# Patient Record
Sex: Male | Born: 1955
Health system: Southern US, Community
[De-identification: ages and names within clinical notes are randomized; demographics above are authoritative.]

## PROBLEM LIST (undated history)

## (undated) ENCOUNTER — Emergency Department (HOSPITAL_COMMUNITY): Admission: EM | Payer: HMO | Source: Home / Self Care

## (undated) DIAGNOSIS — I82409 Acute embolism and thrombosis of unspecified deep veins of unspecified lower extremity: Secondary | ICD-10-CM

## (undated) DIAGNOSIS — M549 Dorsalgia, unspecified: Secondary | ICD-10-CM

## (undated) DIAGNOSIS — F32A Depression, unspecified: Secondary | ICD-10-CM

## (undated) DIAGNOSIS — Z87442 Personal history of urinary calculi: Secondary | ICD-10-CM

## (undated) DIAGNOSIS — F329 Major depressive disorder, single episode, unspecified: Secondary | ICD-10-CM

## (undated) DIAGNOSIS — C801 Malignant (primary) neoplasm, unspecified: Secondary | ICD-10-CM

## (undated) DIAGNOSIS — N289 Disorder of kidney and ureter, unspecified: Secondary | ICD-10-CM

## (undated) DIAGNOSIS — N200 Calculus of kidney: Secondary | ICD-10-CM

## (undated) DIAGNOSIS — K5730 Diverticulosis of large intestine without perforation or abscess without bleeding: Secondary | ICD-10-CM

## (undated) DIAGNOSIS — I10 Essential (primary) hypertension: Secondary | ICD-10-CM

## (undated) DIAGNOSIS — IMO0002 Reserved for concepts with insufficient information to code with codable children: Secondary | ICD-10-CM

## (undated) DIAGNOSIS — E785 Hyperlipidemia, unspecified: Secondary | ICD-10-CM

## (undated) DIAGNOSIS — I2699 Other pulmonary embolism without acute cor pulmonale: Secondary | ICD-10-CM

## (undated) DIAGNOSIS — M1990 Unspecified osteoarthritis, unspecified site: Secondary | ICD-10-CM

## (undated) DIAGNOSIS — G4733 Obstructive sleep apnea (adult) (pediatric): Secondary | ICD-10-CM

## (undated) DIAGNOSIS — E669 Obesity, unspecified: Secondary | ICD-10-CM

## (undated) DIAGNOSIS — I2510 Atherosclerotic heart disease of native coronary artery without angina pectoris: Secondary | ICD-10-CM

## (undated) DIAGNOSIS — I951 Orthostatic hypotension: Secondary | ICD-10-CM

## (undated) DIAGNOSIS — Z7901 Long term (current) use of anticoagulants: Secondary | ICD-10-CM

## (undated) DIAGNOSIS — K219 Gastro-esophageal reflux disease without esophagitis: Secondary | ICD-10-CM

## (undated) DIAGNOSIS — Z86010 Personal history of colonic polyps: Secondary | ICD-10-CM

## (undated) DIAGNOSIS — E291 Testicular hypofunction: Secondary | ICD-10-CM

## (undated) DIAGNOSIS — M1710 Unilateral primary osteoarthritis, unspecified knee: Secondary | ICD-10-CM

## (undated) HISTORY — DX: Unilateral primary osteoarthritis, unspecified knee: M17.10

## (undated) HISTORY — DX: Unspecified osteoarthritis, unspecified site: M19.90

## (undated) HISTORY — DX: Calculus of kidney: N20.0

## (undated) HISTORY — DX: Essential (primary) hypertension: I10

## (undated) HISTORY — PX: GASTRIC BYPASS: SHX52

## (undated) HISTORY — DX: Depression, unspecified: F32.A

## (undated) HISTORY — DX: Other pulmonary embolism without acute cor pulmonale: I26.99

## (undated) HISTORY — DX: Malignant (primary) neoplasm, unspecified: C80.1

## (undated) HISTORY — DX: Obstructive sleep apnea (adult) (pediatric): G47.33

## (undated) HISTORY — DX: Hyperlipidemia, unspecified: E78.5

## (undated) HISTORY — DX: Testicular hypofunction: E29.1

## (undated) HISTORY — PX: TONSILLECTOMY AND ADENOIDECTOMY: SUR1326

## (undated) HISTORY — DX: Dorsalgia, unspecified: M54.9

## (undated) HISTORY — DX: Diverticulosis of large intestine without perforation or abscess without bleeding: K57.30

## (undated) HISTORY — DX: Gastro-esophageal reflux disease without esophagitis: K21.9

## (undated) HISTORY — DX: Atherosclerotic heart disease of native coronary artery without angina pectoris: I25.10

## (undated) HISTORY — DX: Major depressive disorder, single episode, unspecified: F32.9

## (undated) HISTORY — DX: Acute embolism and thrombosis of unspecified deep veins of unspecified lower extremity: I82.409

## (undated) HISTORY — DX: Disorder of kidney and ureter, unspecified: N28.9

## (undated) HISTORY — DX: Reserved for concepts with insufficient information to code with codable children: IMO0002

## (undated) HISTORY — DX: Personal history of colonic polyps: Z86.010

## (undated) HISTORY — DX: Orthostatic hypotension: I95.1

## (undated) HISTORY — DX: Obesity, unspecified: E66.9

## (undated) HISTORY — DX: Long term (current) use of anticoagulants: Z79.01

## (undated) HISTORY — PX: COLONOSCOPY: SHX174

## (undated) HISTORY — PX: OTHER SURGICAL HISTORY: SHX169

---
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
## 2000-01-25 ENCOUNTER — Encounter: Payer: Self-pay | Admitting: Orthopedic Surgery

## 2000-01-29 ENCOUNTER — Ambulatory Visit (HOSPITAL_COMMUNITY): Admission: RE | Admit: 2000-01-29 | Discharge: 2000-01-29 | Payer: Self-pay | Admitting: Orthopedic Surgery

## 2000-01-29 ENCOUNTER — Encounter (INDEPENDENT_AMBULATORY_CARE_PROVIDER_SITE_OTHER): Payer: Self-pay | Admitting: Specialist

## 2002-11-19 ENCOUNTER — Encounter: Admission: RE | Admit: 2002-11-19 | Discharge: 2002-11-19 | Payer: Self-pay | Admitting: Orthopaedic Surgery

## 2002-11-19 ENCOUNTER — Encounter: Payer: Self-pay | Admitting: Orthopaedic Surgery

## 2003-01-16 ENCOUNTER — Ambulatory Visit (HOSPITAL_COMMUNITY): Admission: RE | Admit: 2003-01-16 | Discharge: 2003-01-16 | Payer: Self-pay | Admitting: Orthopedic Surgery

## 2003-12-07 HISTORY — PX: HEEL SPUR SURGERY: SHX665

## 2004-07-10 ENCOUNTER — Ambulatory Visit (HOSPITAL_BASED_OUTPATIENT_CLINIC_OR_DEPARTMENT_OTHER): Admission: RE | Admit: 2004-07-10 | Discharge: 2004-07-10 | Payer: Self-pay | Admitting: Internal Medicine

## 2004-08-02 ENCOUNTER — Encounter: Payer: Self-pay | Admitting: Internal Medicine

## 2004-08-20 ENCOUNTER — Encounter: Payer: Self-pay | Admitting: Internal Medicine

## 2004-10-03 ENCOUNTER — Ambulatory Visit (HOSPITAL_COMMUNITY): Admission: RE | Admit: 2004-10-03 | Discharge: 2004-10-03 | Payer: Self-pay | Admitting: Orthopaedic Surgery

## 2004-10-04 ENCOUNTER — Encounter: Admission: RE | Admit: 2004-10-04 | Discharge: 2004-10-04 | Payer: Self-pay | Admitting: Orthopaedic Surgery

## 2004-10-08 ENCOUNTER — Ambulatory Visit: Payer: Self-pay | Admitting: Pulmonary Disease

## 2004-10-19 ENCOUNTER — Emergency Department (HOSPITAL_COMMUNITY): Admission: EM | Admit: 2004-10-19 | Discharge: 2004-10-19 | Payer: Self-pay | Admitting: Emergency Medicine

## 2004-11-06 ENCOUNTER — Ambulatory Visit: Payer: Self-pay | Admitting: Pulmonary Disease

## 2004-11-06 ENCOUNTER — Ambulatory Visit (HOSPITAL_BASED_OUTPATIENT_CLINIC_OR_DEPARTMENT_OTHER): Admission: RE | Admit: 2004-11-06 | Discharge: 2004-11-06 | Payer: Self-pay | Admitting: Pulmonary Disease

## 2004-11-24 ENCOUNTER — Ambulatory Visit: Admission: RE | Admit: 2004-11-24 | Discharge: 2004-12-03 | Payer: Self-pay | Admitting: Radiation Oncology

## 2004-11-27 ENCOUNTER — Observation Stay (HOSPITAL_COMMUNITY): Admission: RE | Admit: 2004-11-27 | Discharge: 2004-11-28 | Payer: Self-pay | Admitting: Orthopedic Surgery

## 2005-04-08 ENCOUNTER — Ambulatory Visit: Payer: Self-pay | Admitting: Internal Medicine

## 2005-04-16 ENCOUNTER — Ambulatory Visit: Payer: Self-pay

## 2005-07-15 ENCOUNTER — Ambulatory Visit: Payer: Self-pay | Admitting: Internal Medicine

## 2005-07-26 ENCOUNTER — Ambulatory Visit: Payer: Self-pay | Admitting: Endocrinology

## 2005-07-26 ENCOUNTER — Inpatient Hospital Stay (HOSPITAL_COMMUNITY): Admission: EM | Admit: 2005-07-26 | Discharge: 2005-08-05 | Payer: Self-pay

## 2005-07-27 ENCOUNTER — Ambulatory Visit: Payer: Self-pay | Admitting: *Deleted

## 2005-07-27 ENCOUNTER — Encounter: Payer: Self-pay | Admitting: Cardiology

## 2005-08-10 ENCOUNTER — Ambulatory Visit: Payer: Self-pay | Admitting: Internal Medicine

## 2005-08-13 ENCOUNTER — Emergency Department (HOSPITAL_COMMUNITY): Admission: EM | Admit: 2005-08-13 | Discharge: 2005-08-13 | Payer: Self-pay | Admitting: Emergency Medicine

## 2005-08-13 ENCOUNTER — Ambulatory Visit: Payer: Self-pay | Admitting: Internal Medicine

## 2005-08-20 ENCOUNTER — Ambulatory Visit: Payer: Self-pay | Admitting: Cardiology

## 2005-08-20 ENCOUNTER — Ambulatory Visit: Payer: Self-pay | Admitting: Internal Medicine

## 2005-08-26 ENCOUNTER — Ambulatory Visit: Payer: Self-pay | Admitting: Cardiology

## 2005-09-09 ENCOUNTER — Ambulatory Visit: Payer: Self-pay | Admitting: Internal Medicine

## 2005-09-10 ENCOUNTER — Ambulatory Visit: Payer: Self-pay | Admitting: Internal Medicine

## 2005-09-17 ENCOUNTER — Ambulatory Visit: Payer: Self-pay | Admitting: Cardiology

## 2005-09-21 ENCOUNTER — Ambulatory Visit: Payer: Self-pay | Admitting: Internal Medicine

## 2005-09-29 ENCOUNTER — Ambulatory Visit: Payer: Self-pay | Admitting: Cardiology

## 2005-09-29 ENCOUNTER — Ambulatory Visit: Payer: Self-pay

## 2005-10-12 ENCOUNTER — Ambulatory Visit: Payer: Self-pay

## 2005-10-13 ENCOUNTER — Ambulatory Visit: Payer: Self-pay

## 2005-10-21 ENCOUNTER — Ambulatory Visit: Payer: Self-pay | Admitting: Cardiology

## 2005-10-21 ENCOUNTER — Ambulatory Visit: Payer: Self-pay | Admitting: Internal Medicine

## 2005-11-08 ENCOUNTER — Ambulatory Visit: Payer: Self-pay | Admitting: Cardiology

## 2005-12-03 ENCOUNTER — Ambulatory Visit: Payer: Self-pay | Admitting: Cardiology

## 2005-12-13 ENCOUNTER — Ambulatory Visit: Payer: Self-pay | Admitting: Cardiology

## 2006-01-03 ENCOUNTER — Ambulatory Visit: Payer: Self-pay | Admitting: Cardiology

## 2006-01-10 ENCOUNTER — Ambulatory Visit: Payer: Self-pay | Admitting: Internal Medicine

## 2006-01-31 ENCOUNTER — Ambulatory Visit: Payer: Self-pay | Admitting: Internal Medicine

## 2006-02-14 ENCOUNTER — Ambulatory Visit: Payer: Self-pay | Admitting: Cardiology

## 2006-03-14 ENCOUNTER — Ambulatory Visit: Payer: Self-pay | Admitting: Cardiology

## 2006-04-06 ENCOUNTER — Ambulatory Visit: Payer: Self-pay | Admitting: Internal Medicine

## 2006-04-11 ENCOUNTER — Ambulatory Visit: Payer: Self-pay | Admitting: Cardiology

## 2006-04-25 ENCOUNTER — Ambulatory Visit: Payer: Self-pay | Admitting: Internal Medicine

## 2006-05-23 ENCOUNTER — Ambulatory Visit: Payer: Self-pay | Admitting: Cardiology

## 2006-06-06 ENCOUNTER — Ambulatory Visit: Payer: Self-pay | Admitting: Cardiovascular Disease

## 2006-06-13 DIAGNOSIS — Z86711 Personal history of pulmonary embolism: Secondary | ICD-10-CM | POA: Insufficient documentation

## 2006-07-04 ENCOUNTER — Ambulatory Visit: Payer: Self-pay | Admitting: Cardiovascular Disease

## 2006-07-18 ENCOUNTER — Ambulatory Visit: Payer: Self-pay | Admitting: Cardiology

## 2006-07-18 ENCOUNTER — Ambulatory Visit: Payer: Self-pay | Admitting: Cardiovascular Disease

## 2006-08-01 ENCOUNTER — Ambulatory Visit: Payer: Self-pay | Admitting: Cardiology

## 2006-08-25 ENCOUNTER — Ambulatory Visit: Payer: Self-pay | Admitting: Cardiology

## 2006-09-08 ENCOUNTER — Ambulatory Visit: Payer: Self-pay | Admitting: Cardiology

## 2006-09-29 ENCOUNTER — Ambulatory Visit: Payer: Self-pay | Admitting: Cardiology

## 2006-11-07 ENCOUNTER — Ambulatory Visit: Payer: Self-pay | Admitting: Internal Medicine

## 2006-12-06 HISTORY — PX: OTHER SURGICAL HISTORY: SHX169

## 2007-01-04 ENCOUNTER — Ambulatory Visit: Payer: Self-pay | Admitting: Internal Medicine

## 2007-01-04 ENCOUNTER — Ambulatory Visit: Payer: Self-pay | Admitting: Cardiology

## 2007-01-09 ENCOUNTER — Ambulatory Visit: Payer: Self-pay | Admitting: Internal Medicine

## 2007-01-09 LAB — CONVERTED CEMR LAB
AST: 25 units/L (ref 0–37)
Alkaline Phosphatase: 71 units/L (ref 39–117)
Basophils Absolute: 0 10*3/uL (ref 0.0–0.1)
Cholesterol: 173 mg/dL (ref 0–200)
Eosinophils Absolute: 0.1 10*3/uL (ref 0.0–0.6)
Eosinophils Relative: 1.7 % (ref 0.0–5.0)
GFR calc non Af Amer: 45 mL/min
HCT: 44.6 % (ref 39.0–52.0)
HDL: 37.6 mg/dL — ABNORMAL LOW (ref >39.0)
MCV: 71.1 fL — ABNORMAL LOW (ref 78.0–100.0)
Monocytes Absolute: 0.8 10*3/uL — ABNORMAL HIGH (ref 0.2–0.7)
Monocytes Relative: 8.9 % (ref 3.0–11.0)
Neutro Abs: 4.5 10*3/uL (ref 1.4–7.7)
Neutrophils Relative %: 50.5 % (ref 43.0–77.0)
Potassium: 4.8 meq/L (ref 3.5–5.1)
RBC: 6.26 M/uL — ABNORMAL HIGH (ref 4.22–5.81)
RDW: 13.2 % (ref 11.5–14.6)
Total Bilirubin: 0.7 mg/dL (ref 0.3–1.2)
Total CHOL/HDL Ratio: 4.6
Total Protein: 7.5 g/dL (ref 6.0–8.3)
Triglycerides: 136 mg/dL (ref 0–149)
VLDL: 27 mg/dL (ref 0–40)
WBC: 8.8 10*3/uL (ref 4.5–10.5)

## 2007-02-01 ENCOUNTER — Ambulatory Visit: Payer: Self-pay | Admitting: Internal Medicine

## 2007-02-06 ENCOUNTER — Ambulatory Visit: Payer: Self-pay | Admitting: Internal Medicine

## 2007-02-13 ENCOUNTER — Encounter (INDEPENDENT_AMBULATORY_CARE_PROVIDER_SITE_OTHER): Payer: Self-pay | Admitting: Specialist

## 2007-02-13 ENCOUNTER — Ambulatory Visit: Payer: Self-pay | Admitting: Internal Medicine

## 2007-02-15 ENCOUNTER — Ambulatory Visit: Payer: Self-pay | Admitting: Cardiology

## 2007-02-20 ENCOUNTER — Ambulatory Visit: Payer: Self-pay | Admitting: Internal Medicine

## 2007-02-20 LAB — CONVERTED CEMR LAB
ALT: 23 units/L (ref 0–40)
Albumin: 4 g/dL (ref 3.5–5.2)
Alkaline Phosphatase: 54 units/L (ref 39–117)
Bilirubin Urine: NEGATIVE
Bilirubin, Direct: 0.1 mg/dL (ref 0.0–0.3)
Calcium: 9.3 mg/dL (ref 8.4–10.5)
Chloride: 102 meq/L (ref 96–112)
Creatinine, Ser: 2.3 mg/dL — ABNORMAL HIGH (ref 0.4–1.5)
Creatinine,U: 364.5 mg/dL
GFR calc non Af Amer: 32 mL/min
Glucose, Bld: 148 mg/dL — ABNORMAL HIGH (ref 70–99)
HCT: 35.4 % — ABNORMAL LOW (ref 39.0–52.0)
LDL Cholesterol: 71 mg/dL (ref 0–99)
Leukocytes, UA: NEGATIVE
Lymphocytes Relative: 23.1 % (ref 12.0–46.0)
MCHC: 33.2 g/dL (ref 30.0–36.0)
MCV: 69.1 fL — ABNORMAL LOW (ref 78.0–100.0)
Monocytes Relative: 6.1 % (ref 3.0–11.0)
Neutro Abs: 7.5 10*3/uL (ref 1.4–7.7)
Neutrophils Relative %: 68.6 % (ref 43.0–77.0)
PSA: 0.3 ng/mL
Sodium: 140 meq/L (ref 135–145)
Specific Gravity, Urine: >=1.03 (ref 1.000–1.03)
Total Bilirubin: 0.5 mg/dL (ref 0.3–1.2)
Total Protein, Urine: NEGATIVE mg/dL
Total Protein: 7.1 g/dL (ref 6.0–8.3)
Triglycerides: 175 mg/dL — ABNORMAL HIGH (ref 0–149)
Urobilinogen, UA: 0.2 (ref 0.0–1.0)
VLDL: 35 mg/dL (ref 0–40)
WBC: 11.1 10*3/uL — ABNORMAL HIGH (ref 4.5–10.5)
pH: 5 (ref 5.0–8.0)

## 2007-02-21 ENCOUNTER — Ambulatory Visit: Payer: Self-pay | Admitting: Internal Medicine

## 2007-02-27 ENCOUNTER — Ambulatory Visit (HOSPITAL_COMMUNITY): Admission: RE | Admit: 2007-02-27 | Discharge: 2007-02-27 | Payer: Self-pay | Admitting: Internal Medicine

## 2007-03-01 ENCOUNTER — Ambulatory Visit: Payer: Self-pay | Admitting: Cardiovascular Disease

## 2007-03-20 ENCOUNTER — Ambulatory Visit: Payer: Self-pay | Admitting: Internal Medicine

## 2007-03-20 LAB — CONVERTED CEMR LAB
Albumin: 3.6 g/dL (ref 3.5–5.2)
BUN: 17 mg/dL (ref 6–23)
Calcium: 9.3 mg/dL (ref 8.4–10.5)
Chloride: 106 meq/L (ref 96–112)
Creatinine, Ser: 1.2 mg/dL (ref 0.4–1.5)
Phosphorus: 3.2 mg/dL (ref 2.3–4.6)
Potassium: 4.4 meq/L (ref 3.5–5.1)
Sodium: 144 meq/L (ref 135–145)

## 2007-03-22 ENCOUNTER — Ambulatory Visit (HOSPITAL_COMMUNITY): Admission: RE | Admit: 2007-03-22 | Discharge: 2007-03-22 | Payer: Self-pay | Admitting: Internal Medicine

## 2007-03-22 ENCOUNTER — Ambulatory Visit: Payer: Self-pay | Admitting: Cardiology

## 2007-04-07 ENCOUNTER — Ambulatory Visit: Payer: Self-pay | Admitting: Cardiology

## 2007-04-12 ENCOUNTER — Ambulatory Visit: Payer: Self-pay | Admitting: Pulmonary Disease

## 2007-06-30 ENCOUNTER — Ambulatory Visit: Payer: Self-pay | Admitting: Cardiology

## 2007-06-30 ENCOUNTER — Encounter: Payer: Self-pay | Admitting: Internal Medicine

## 2007-06-30 DIAGNOSIS — E785 Hyperlipidemia, unspecified: Secondary | ICD-10-CM | POA: Insufficient documentation

## 2007-06-30 DIAGNOSIS — I10 Essential (primary) hypertension: Secondary | ICD-10-CM | POA: Insufficient documentation

## 2007-06-30 DIAGNOSIS — E119 Type 2 diabetes mellitus without complications: Secondary | ICD-10-CM | POA: Insufficient documentation

## 2007-07-21 ENCOUNTER — Ambulatory Visit: Payer: Self-pay | Admitting: Cardiology

## 2007-08-29 ENCOUNTER — Ambulatory Visit: Payer: Self-pay | Admitting: Cardiology

## 2007-09-19 ENCOUNTER — Ambulatory Visit: Payer: Self-pay | Admitting: Pulmonary Disease

## 2007-09-19 LAB — CONVERTED CEMR LAB
Bilirubin Urine: NEGATIVE
Crystals: NEGATIVE
Ketones, ur: NEGATIVE mg/dL
Nitrite: NEGATIVE
Specific Gravity, Urine: 1.025 (ref 1.000–1.03)
Total Protein, Urine: 30 mg/dL — AB
Urobilinogen, UA: 0.2 (ref 0.0–1.0)
pH: 6 (ref 5.0–8.0)

## 2007-10-05 ENCOUNTER — Encounter: Payer: Self-pay | Admitting: Internal Medicine

## 2007-10-05 ENCOUNTER — Ambulatory Visit: Payer: Self-pay | Admitting: Internal Medicine

## 2007-10-05 DIAGNOSIS — F329 Major depressive disorder, single episode, unspecified: Secondary | ICD-10-CM

## 2007-10-05 DIAGNOSIS — F32A Depression, unspecified: Secondary | ICD-10-CM | POA: Insufficient documentation

## 2007-10-05 DIAGNOSIS — K5730 Diverticulosis of large intestine without perforation or abscess without bleeding: Secondary | ICD-10-CM | POA: Insufficient documentation

## 2007-10-05 DIAGNOSIS — N259 Disorder resulting from impaired renal tubular function, unspecified: Secondary | ICD-10-CM | POA: Insufficient documentation

## 2007-10-05 DIAGNOSIS — N390 Urinary tract infection, site not specified: Secondary | ICD-10-CM | POA: Insufficient documentation

## 2007-10-05 DIAGNOSIS — Z860100 Personal history of colon polyps, unspecified: Secondary | ICD-10-CM | POA: Insufficient documentation

## 2007-10-10 LAB — CONVERTED CEMR LAB
Cholesterol: 255 mg/dL (ref 0–200)
Creatinine, Ser: 1.6 mg/dL — ABNORMAL HIGH (ref 0.4–1.5)
GFR calc Af Amer: 58 mL/min
GFR calc non Af Amer: 48 mL/min
Hgb A1c MFr Bld: 7.3 % — ABNORMAL HIGH (ref 4.6–6.0)
Sodium: 141 meq/L (ref 135–145)
Triglycerides: 168 mg/dL — ABNORMAL HIGH (ref 0–149)
VLDL: 34 mg/dL (ref 0–40)

## 2007-12-21 ENCOUNTER — Ambulatory Visit: Payer: Self-pay | Admitting: Internal Medicine

## 2008-01-18 ENCOUNTER — Ambulatory Visit: Payer: Self-pay | Admitting: Cardiology

## 2008-01-29 ENCOUNTER — Ambulatory Visit: Payer: Self-pay | Admitting: Cardiology

## 2008-02-12 ENCOUNTER — Ambulatory Visit: Payer: Self-pay | Admitting: Cardiology

## 2008-03-04 ENCOUNTER — Ambulatory Visit: Payer: Self-pay | Admitting: Internal Medicine

## 2008-03-04 DIAGNOSIS — E291 Testicular hypofunction: Secondary | ICD-10-CM | POA: Insufficient documentation

## 2008-03-04 HISTORY — DX: Morbid (severe) obesity due to excess calories: E66.01

## 2008-03-06 LAB — CONVERTED CEMR LAB
AST: 58 units/L — ABNORMAL HIGH (ref 0–37)
Alkaline Phosphatase: 38 units/L — ABNORMAL LOW (ref 39–117)
Basophils Absolute: 0 10*3/uL (ref 0.0–0.1)
Bilirubin, Direct: 0.1 mg/dL (ref 0.0–0.3)
Eosinophils Absolute: 0.1 10*3/uL (ref 0.0–0.7)
GFR calc Af Amer: 74 mL/min
GFR calc non Af Amer: 61 mL/min
Glucose, Bld: 61 mg/dL — ABNORMAL LOW (ref 70–99)
HDL: 50.2 mg/dL (ref >39.0)
Hemoglobin: 12 g/dL — ABNORMAL LOW (ref 13.0–17.0)
Hgb A1c MFr Bld: 6.9 % — ABNORMAL HIGH (ref 4.6–6.0)
Lymphocytes Relative: 34.7 % (ref 12.0–46.0)
Monocytes Absolute: 0.6 10*3/uL (ref 0.1–1.0)
Monocytes Relative: 7.2 % (ref 3.0–12.0)
Neutro Abs: 4.4 10*3/uL (ref 1.4–7.7)
RDW: 14.6 % (ref 11.5–14.6)
Specific Gravity, Urine: >=1.03 (ref 1.000–1.03)
Total CHOL/HDL Ratio: 3.1
VLDL: 18 mg/dL (ref 0–40)
WBC: 7.8 10*3/uL (ref 4.5–10.5)
pH: 5 (ref 5.0–8.0)

## 2008-03-11 ENCOUNTER — Ambulatory Visit: Payer: Self-pay | Admitting: Cardiovascular Disease

## 2008-03-25 ENCOUNTER — Ambulatory Visit: Payer: Self-pay | Admitting: Cardiology

## 2008-04-22 ENCOUNTER — Ambulatory Visit: Payer: Self-pay | Admitting: Cardiology

## 2008-05-20 ENCOUNTER — Ambulatory Visit: Payer: Self-pay | Admitting: Internal Medicine

## 2008-05-22 ENCOUNTER — Ambulatory Visit: Payer: Self-pay | Admitting: Internal Medicine

## 2008-05-22 DIAGNOSIS — M79609 Pain in unspecified limb: Secondary | ICD-10-CM | POA: Insufficient documentation

## 2008-07-17 ENCOUNTER — Ambulatory Visit: Payer: Self-pay | Admitting: Cardiology

## 2008-07-18 ENCOUNTER — Ambulatory Visit: Payer: Self-pay | Admitting: Internal Medicine

## 2008-07-24 ENCOUNTER — Telehealth: Payer: Self-pay | Admitting: Internal Medicine

## 2008-07-25 ENCOUNTER — Inpatient Hospital Stay (HOSPITAL_COMMUNITY): Admission: RE | Admit: 2008-07-25 | Discharge: 2008-07-26 | Payer: Self-pay | Admitting: Orthopedic Surgery

## 2008-08-05 ENCOUNTER — Ambulatory Visit: Payer: Self-pay | Admitting: Cardiology

## 2008-08-30 ENCOUNTER — Ambulatory Visit: Payer: Self-pay | Admitting: Internal Medicine

## 2008-08-31 LAB — CONVERTED CEMR LAB
AST: 29 units/L (ref 0–37)
Alkaline Phosphatase: 57 units/L (ref 39–117)
Calcium: 9.4 mg/dL (ref 8.4–10.5)
Chloride: 104 meq/L (ref 96–112)
GFR calc Af Amer: 81 mL/min
HDL: 38.4 mg/dL — ABNORMAL LOW (ref >39.0)
LDL Cholesterol: 58 mg/dL (ref 0–99)
Total Bilirubin: 0.8 mg/dL (ref 0.3–1.2)
Total CHOL/HDL Ratio: 2.9
Total Protein: 7.2 g/dL (ref 6.0–8.3)
Triglycerides: 83 mg/dL (ref 0–149)

## 2008-09-02 ENCOUNTER — Ambulatory Visit: Payer: Self-pay | Admitting: Internal Medicine

## 2008-09-23 ENCOUNTER — Ambulatory Visit: Payer: Self-pay | Admitting: Internal Medicine

## 2008-10-09 ENCOUNTER — Telehealth: Payer: Self-pay | Admitting: Internal Medicine

## 2008-10-21 ENCOUNTER — Ambulatory Visit: Payer: Self-pay | Admitting: Cardiology

## 2008-11-18 ENCOUNTER — Ambulatory Visit: Payer: Self-pay | Admitting: Cardiology

## 2008-12-02 ENCOUNTER — Ambulatory Visit: Payer: Self-pay | Admitting: Internal Medicine

## 2008-12-09 ENCOUNTER — Ambulatory Visit: Payer: Self-pay | Admitting: Internal Medicine

## 2008-12-17 ENCOUNTER — Ambulatory Visit: Payer: Self-pay | Admitting: Cardiovascular Disease

## 2008-12-27 ENCOUNTER — Ambulatory Visit: Payer: Self-pay | Admitting: Internal Medicine

## 2008-12-27 LAB — CONVERTED CEMR LAB
Basophils Absolute: 0.1 10*3/uL (ref 0.0–0.1)
Basophils Relative: 1.8 % (ref 0.0–3.0)
Eosinophils Relative: 0.9 % (ref 0.0–5.0)
GFR calc Af Amer: 58 mL/min
HCT: 39.2 % (ref 39.0–52.0)
Hemoglobin, Urine: NEGATIVE
Hemoglobin: 13 g/dL (ref 13.0–17.0)
Leukocytes, UA: NEGATIVE
Lipase: 26 units/L (ref 11.0–59.0)
MCV: 71.2 fL — ABNORMAL LOW (ref 78.0–100.0)
Neutro Abs: 5.4 10*3/uL (ref 1.4–7.7)
Neutrophils Relative %: 66.1 % (ref 43.0–77.0)
Nitrite: NEGATIVE
RBC: 5.51 M/uL (ref 4.22–5.81)
Sodium: 131 meq/L — ABNORMAL LOW (ref 135–145)
Specific Gravity, Urine: >=1.03 (ref 1.000–1.03)
pH: 5 (ref 5.0–8.0)

## 2008-12-31 ENCOUNTER — Encounter (INDEPENDENT_AMBULATORY_CARE_PROVIDER_SITE_OTHER): Payer: Self-pay | Admitting: *Deleted

## 2009-01-07 ENCOUNTER — Ambulatory Visit: Payer: Self-pay | Admitting: Cardiology

## 2009-01-13 ENCOUNTER — Telehealth: Payer: Self-pay | Admitting: Internal Medicine

## 2009-01-21 ENCOUNTER — Ambulatory Visit: Payer: Self-pay | Admitting: Cardiology

## 2009-02-11 ENCOUNTER — Ambulatory Visit: Payer: Self-pay | Admitting: Cardiology

## 2009-02-13 ENCOUNTER — Telehealth: Payer: Self-pay | Admitting: Internal Medicine

## 2009-03-03 ENCOUNTER — Ambulatory Visit: Payer: Self-pay | Admitting: Cardiology

## 2009-03-11 ENCOUNTER — Ambulatory Visit: Payer: Self-pay | Admitting: Internal Medicine

## 2009-03-11 LAB — CONVERTED CEMR LAB
ALT: 24 units/L (ref 0–53)
AST: 28 units/L (ref 0–37)
Albumin: 3.9 g/dL (ref 3.5–5.2)
BUN: 27 mg/dL — ABNORMAL HIGH (ref 6–23)
Basophils Absolute: 0 10*3/uL (ref 0.0–0.1)
Bilirubin Urine: NEGATIVE
Bilirubin, Direct: 0.1 mg/dL (ref 0.0–0.3)
Creatinine, Ser: 1.2 mg/dL (ref 0.4–1.5)
Eosinophils Absolute: 0.2 10*3/uL (ref 0.0–0.7)
Glucose, Bld: 100 mg/dL — ABNORMAL HIGH (ref 70–99)
HDL: 36.1 mg/dL — ABNORMAL LOW (ref >39.00)
Hemoglobin, Urine: NEGATIVE
Hemoglobin: 12.1 g/dL — ABNORMAL LOW (ref 13.0–17.0)
LDL Cholesterol: 76 mg/dL (ref 0–99)
Leukocytes, UA: NEGATIVE
Monocytes Absolute: 0.7 10*3/uL (ref 0.1–1.0)
Neutrophils Relative %: 61 % (ref 43.0–77.0)
Nitrite: NEGATIVE
RBC: 5.23 M/uL (ref 4.22–5.81)
Sodium: 138 meq/L (ref 135–145)
Total Bilirubin: 0.6 mg/dL (ref 0.3–1.2)
Total CHOL/HDL Ratio: 4
Triglycerides: 88 mg/dL (ref 0.0–149.0)
Urobilinogen, UA: 0.2 (ref 0.0–1.0)
WBC: 8.9 10*3/uL (ref 4.5–10.5)

## 2009-03-31 ENCOUNTER — Ambulatory Visit: Payer: Self-pay | Admitting: Cardiology

## 2009-04-16 ENCOUNTER — Ambulatory Visit: Payer: Self-pay | Admitting: Cardiology

## 2009-04-23 ENCOUNTER — Ambulatory Visit: Payer: Self-pay | Admitting: Cardiology

## 2009-05-06 ENCOUNTER — Encounter: Payer: Self-pay | Admitting: *Deleted

## 2009-05-07 ENCOUNTER — Ambulatory Visit: Payer: Self-pay | Admitting: Cardiology

## 2009-05-16 ENCOUNTER — Ambulatory Visit: Payer: Self-pay | Admitting: Cardiovascular Disease

## 2009-05-16 LAB — CONVERTED CEMR LAB
POC INR: 2.2
Protime: 18.2

## 2009-06-07 ENCOUNTER — Ambulatory Visit: Payer: Self-pay | Admitting: Family Medicine

## 2009-06-11 ENCOUNTER — Encounter: Payer: Self-pay | Admitting: *Deleted

## 2009-06-12 ENCOUNTER — Ambulatory Visit: Payer: Self-pay | Admitting: Internal Medicine

## 2009-06-12 LAB — CONVERTED CEMR LAB
POC INR: 1.6
Prothrombin Time: 15.6 s

## 2009-06-27 ENCOUNTER — Ambulatory Visit: Payer: Self-pay | Admitting: Internal Medicine

## 2009-06-27 LAB — CONVERTED CEMR LAB: POC INR: 1.8

## 2009-07-01 ENCOUNTER — Ambulatory Visit: Payer: Self-pay | Admitting: Internal Medicine

## 2009-07-01 ENCOUNTER — Encounter: Payer: Self-pay | Admitting: Internal Medicine

## 2009-07-01 DIAGNOSIS — R062 Wheezing: Secondary | ICD-10-CM | POA: Insufficient documentation

## 2009-07-11 ENCOUNTER — Ambulatory Visit: Payer: Self-pay | Admitting: Cardiology

## 2009-07-11 LAB — CONVERTED CEMR LAB: POC INR: 1.8

## 2009-07-24 ENCOUNTER — Telehealth (INDEPENDENT_AMBULATORY_CARE_PROVIDER_SITE_OTHER): Payer: Self-pay | Admitting: Cardiology

## 2009-07-25 ENCOUNTER — Ambulatory Visit: Payer: Self-pay | Admitting: Cardiovascular Disease

## 2009-08-08 ENCOUNTER — Ambulatory Visit: Payer: Self-pay | Admitting: Internal Medicine

## 2009-09-08 ENCOUNTER — Ambulatory Visit: Payer: Self-pay | Admitting: Internal Medicine

## 2009-09-11 ENCOUNTER — Ambulatory Visit: Payer: Self-pay | Admitting: Internal Medicine

## 2009-09-11 LAB — CONVERTED CEMR LAB
CO2: 31 meq/L (ref 19–32)
Calcium: 9.2 mg/dL (ref 8.4–10.5)
Chloride: 103 meq/L (ref 96–112)
Creatinine, Ser: 1 mg/dL (ref 0.4–1.5)
Glucose, Bld: 69 mg/dL — ABNORMAL LOW (ref 70–99)
Hgb A1c MFr Bld: 6.7 % — ABNORMAL HIGH (ref 4.6–6.5)
LDL Cholesterol: 79 mg/dL (ref 0–99)
Sodium: 135 meq/L (ref 135–145)
Triglycerides: 66 mg/dL (ref 0.0–149.0)
VLDL: 13.2 mg/dL (ref 0.0–40.0)

## 2009-09-12 ENCOUNTER — Ambulatory Visit: Payer: Self-pay | Admitting: Internal Medicine

## 2009-09-12 DIAGNOSIS — F17200 Nicotine dependence, unspecified, uncomplicated: Secondary | ICD-10-CM | POA: Insufficient documentation

## 2009-09-24 ENCOUNTER — Encounter: Payer: Self-pay | Admitting: Internal Medicine

## 2009-10-06 ENCOUNTER — Ambulatory Visit: Payer: Self-pay | Admitting: Cardiology

## 2009-10-06 LAB — CONVERTED CEMR LAB: POC INR: 2.1

## 2009-11-03 ENCOUNTER — Ambulatory Visit: Payer: Self-pay | Admitting: Cardiology

## 2009-11-17 ENCOUNTER — Ambulatory Visit: Payer: Self-pay | Admitting: Internal Medicine

## 2009-11-17 DIAGNOSIS — IMO0002 Reserved for concepts with insufficient information to code with codable children: Secondary | ICD-10-CM | POA: Insufficient documentation

## 2009-11-17 DIAGNOSIS — M1710 Unilateral primary osteoarthritis, unspecified knee: Secondary | ICD-10-CM

## 2009-12-08 ENCOUNTER — Ambulatory Visit: Payer: Self-pay | Admitting: Cardiology

## 2009-12-08 LAB — CONVERTED CEMR LAB: POC INR: 3.1

## 2009-12-12 ENCOUNTER — Encounter: Payer: Self-pay | Admitting: Internal Medicine

## 2009-12-24 ENCOUNTER — Ambulatory Visit: Payer: Self-pay | Admitting: Internal Medicine

## 2009-12-24 DIAGNOSIS — M549 Dorsalgia, unspecified: Secondary | ICD-10-CM | POA: Insufficient documentation

## 2010-01-05 ENCOUNTER — Ambulatory Visit: Payer: Self-pay | Admitting: Cardiology

## 2010-01-05 LAB — CONVERTED CEMR LAB: POC INR: 3

## 2010-01-12 ENCOUNTER — Ambulatory Visit (HOSPITAL_COMMUNITY): Admission: RE | Admit: 2010-01-12 | Discharge: 2010-01-12 | Payer: Self-pay | Admitting: Internal Medicine

## 2010-01-12 ENCOUNTER — Telehealth: Payer: Self-pay | Admitting: Internal Medicine

## 2010-01-12 ENCOUNTER — Ambulatory Visit: Payer: Self-pay | Admitting: Internal Medicine

## 2010-01-13 LAB — CONVERTED CEMR LAB
ALT: 36 units/L (ref 0–53)
Albumin: 4.1 g/dL (ref 3.5–5.2)
Basophils Relative: 0.1 % (ref 0.0–3.0)
Bilirubin Urine: NEGATIVE
Bilirubin, Direct: 0.2 mg/dL (ref 0.0–0.3)
CO2: 30 meq/L (ref 19–32)
Calcium: 9.8 mg/dL (ref 8.4–10.5)
Chloride: 97 meq/L (ref 96–112)
Creatinine, Ser: 1.5 mg/dL (ref 0.4–1.5)
Eosinophils Absolute: 0.1 10*3/uL (ref 0.0–0.7)
H Pylori IgG: NEGATIVE
Hemoglobin, Urine: NEGATIVE
Hemoglobin: 13.7 g/dL (ref 13.0–17.0)
Hgb A1c MFr Bld: 9.8 % — ABNORMAL HIGH (ref 4.6–6.5)
Lipase: 52 units/L (ref 11.0–59.0)
Lymphs Abs: 2.9 10*3/uL (ref 0.7–4.0)
MCHC: 31.3 g/dL (ref 30.0–36.0)
Monocytes Absolute: 0.7 10*3/uL (ref 0.1–1.0)
Monocytes Relative: 9.8 % (ref 3.0–12.0)
Neutro Abs: 3.9 10*3/uL (ref 1.4–7.7)
Nitrite: NEGATIVE
RBC: 5.88 M/uL — ABNORMAL HIGH (ref 4.22–5.81)
RDW: 13.2 % (ref 11.5–14.6)
Specific Gravity, Urine: 1.02 (ref 1.000–1.030)
Total Protein, Urine: NEGATIVE mg/dL
WBC: 7.6 10*3/uL (ref 4.5–10.5)
pH: 5.5 (ref 5.0–8.0)

## 2010-01-15 ENCOUNTER — Encounter (INDEPENDENT_AMBULATORY_CARE_PROVIDER_SITE_OTHER): Payer: Self-pay | Admitting: *Deleted

## 2010-01-15 ENCOUNTER — Telehealth: Payer: Self-pay | Admitting: Internal Medicine

## 2010-01-16 ENCOUNTER — Encounter (INDEPENDENT_AMBULATORY_CARE_PROVIDER_SITE_OTHER): Payer: Self-pay | Admitting: *Deleted

## 2010-01-20 ENCOUNTER — Telehealth: Payer: Self-pay | Admitting: Internal Medicine

## 2010-01-21 ENCOUNTER — Encounter (INDEPENDENT_AMBULATORY_CARE_PROVIDER_SITE_OTHER): Payer: Self-pay | Admitting: *Deleted

## 2010-01-21 ENCOUNTER — Ambulatory Visit: Payer: Self-pay | Admitting: Gastroenterology

## 2010-01-21 DIAGNOSIS — E669 Obesity, unspecified: Secondary | ICD-10-CM | POA: Insufficient documentation

## 2010-01-21 DIAGNOSIS — R1032 Left lower quadrant pain: Secondary | ICD-10-CM | POA: Insufficient documentation

## 2010-01-21 DIAGNOSIS — K5900 Constipation, unspecified: Secondary | ICD-10-CM | POA: Insufficient documentation

## 2010-01-22 ENCOUNTER — Encounter: Payer: Self-pay | Admitting: Internal Medicine

## 2010-01-22 ENCOUNTER — Ambulatory Visit: Payer: Self-pay | Admitting: Cardiology

## 2010-01-23 ENCOUNTER — Telehealth: Payer: Self-pay | Admitting: Physician Assistant

## 2010-01-29 ENCOUNTER — Ambulatory Visit: Payer: Self-pay | Admitting: Internal Medicine

## 2010-01-29 DIAGNOSIS — I951 Orthostatic hypotension: Secondary | ICD-10-CM | POA: Insufficient documentation

## 2010-01-29 LAB — CONVERTED CEMR LAB
BUN: 24 mg/dL — ABNORMAL HIGH (ref 6–23)
Calcium: 9.4 mg/dL (ref 8.4–10.5)
Chloride: 100 meq/L (ref 96–112)
Creatinine, Ser: 1.3 mg/dL (ref 0.4–1.5)
Potassium: 4.6 meq/L (ref 3.5–5.1)
Sodium: 134 meq/L — ABNORMAL LOW (ref 135–145)

## 2010-01-30 ENCOUNTER — Ambulatory Visit: Payer: Self-pay | Admitting: Internal Medicine

## 2010-01-30 DIAGNOSIS — E860 Dehydration: Secondary | ICD-10-CM | POA: Insufficient documentation

## 2010-02-12 ENCOUNTER — Telehealth: Payer: Self-pay | Admitting: Internal Medicine

## 2010-02-13 ENCOUNTER — Ambulatory Visit: Payer: Self-pay | Admitting: Internal Medicine

## 2010-02-27 ENCOUNTER — Ambulatory Visit: Payer: Self-pay | Admitting: Internal Medicine

## 2010-03-02 ENCOUNTER — Ambulatory Visit: Payer: Self-pay | Admitting: Internal Medicine

## 2010-03-02 ENCOUNTER — Encounter (INDEPENDENT_AMBULATORY_CARE_PROVIDER_SITE_OTHER): Payer: Self-pay | Admitting: *Deleted

## 2010-03-02 DIAGNOSIS — R198 Other specified symptoms and signs involving the digestive system and abdomen: Secondary | ICD-10-CM | POA: Insufficient documentation

## 2010-03-05 ENCOUNTER — Telehealth: Payer: Self-pay | Admitting: Internal Medicine

## 2010-03-09 ENCOUNTER — Telehealth: Payer: Self-pay | Admitting: Internal Medicine

## 2010-03-11 ENCOUNTER — Encounter (INDEPENDENT_AMBULATORY_CARE_PROVIDER_SITE_OTHER): Payer: Self-pay | Admitting: *Deleted

## 2010-03-12 ENCOUNTER — Telehealth (INDEPENDENT_AMBULATORY_CARE_PROVIDER_SITE_OTHER): Payer: Self-pay | Admitting: *Deleted

## 2010-03-12 ENCOUNTER — Ambulatory Visit: Payer: Self-pay | Admitting: Internal Medicine

## 2010-03-12 ENCOUNTER — Encounter (INDEPENDENT_AMBULATORY_CARE_PROVIDER_SITE_OTHER): Payer: Self-pay | Admitting: *Deleted

## 2010-03-18 ENCOUNTER — Ambulatory Visit (HOSPITAL_COMMUNITY): Admission: RE | Admit: 2010-03-18 | Discharge: 2010-03-18 | Payer: Self-pay | Admitting: Internal Medicine

## 2010-03-18 ENCOUNTER — Ambulatory Visit: Payer: Self-pay | Admitting: Internal Medicine

## 2010-03-18 HISTORY — PX: UPPER GASTROINTESTINAL ENDOSCOPY: SHX188

## 2010-03-18 HISTORY — PX: COLONOSCOPY: SHX174

## 2010-03-18 LAB — HM COLONOSCOPY

## 2010-03-24 ENCOUNTER — Ambulatory Visit: Payer: Self-pay | Admitting: Internal Medicine

## 2010-03-24 ENCOUNTER — Telehealth: Payer: Self-pay | Admitting: Internal Medicine

## 2010-03-24 ENCOUNTER — Ambulatory Visit: Payer: Self-pay | Admitting: Cardiovascular Disease

## 2010-03-24 DIAGNOSIS — IMO0001 Reserved for inherently not codable concepts without codable children: Secondary | ICD-10-CM | POA: Insufficient documentation

## 2010-03-24 DIAGNOSIS — E1165 Type 2 diabetes mellitus with hyperglycemia: Secondary | ICD-10-CM

## 2010-03-24 LAB — CONVERTED CEMR LAB
BUN: 14 mg/dL (ref 6–23)
CO2: 29 meq/L (ref 19–32)
Calcium: 9.2 mg/dL (ref 8.4–10.5)
Direct LDL: 163.5 mg/dL
GFR calc non Af Amer: 88.97 mL/min (ref >60)
Glucose, Bld: 129 mg/dL — ABNORMAL HIGH (ref 70–99)
Hgb A1c MFr Bld: 8 % — ABNORMAL HIGH (ref 4.6–6.5)
VLDL: 36.4 mg/dL (ref 0.0–40.0)

## 2010-03-31 ENCOUNTER — Ambulatory Visit: Payer: Self-pay | Admitting: Cardiovascular Disease

## 2010-03-31 ENCOUNTER — Ambulatory Visit: Payer: Self-pay | Admitting: Internal Medicine

## 2010-03-31 LAB — CONVERTED CEMR LAB: POC INR: 3.6

## 2010-04-03 ENCOUNTER — Ambulatory Visit (HOSPITAL_COMMUNITY): Admission: RE | Admit: 2010-04-03 | Discharge: 2010-04-03 | Payer: Self-pay | Admitting: Internal Medicine

## 2010-04-09 ENCOUNTER — Telehealth: Payer: Self-pay | Admitting: Internal Medicine

## 2010-04-10 ENCOUNTER — Ambulatory Visit: Payer: Self-pay | Admitting: Internal Medicine

## 2010-04-14 ENCOUNTER — Ambulatory Visit: Payer: Self-pay | Admitting: Internal Medicine

## 2010-04-14 LAB — CONVERTED CEMR LAB
ALT: 24 units/L (ref 0–53)
AST: 25 units/L (ref 0–37)
Alkaline Phosphatase: 62 units/L (ref 39–117)
BUN: 19 mg/dL (ref 6–23)
Basophils Absolute: 0 10*3/uL (ref 0.0–0.1)
Bilirubin Urine: NEGATIVE
Bilirubin, Direct: 0.1 mg/dL (ref 0.0–0.3)
Calcium: 9.4 mg/dL (ref 8.4–10.5)
Eosinophils Relative: 2.4 % (ref 0.0–5.0)
GFR calc non Af Amer: 75.36 mL/min (ref >60)
HCT: 38.8 % — ABNORMAL LOW (ref 39.0–52.0)
Hemoglobin, Urine: NEGATIVE
Ketones, ur: NEGATIVE mg/dL
LDL Cholesterol: 120 mg/dL — ABNORMAL HIGH (ref 0–99)
Leukocytes, UA: NEGATIVE
Lymphocytes Relative: 24.8 % (ref 12.0–46.0)
Lymphs Abs: 2.4 10*3/uL (ref 0.7–4.0)
Microalb Creat Ratio: 1.1 mg/g (ref 0.0–30.0)
Monocytes Relative: 8.5 % (ref 3.0–12.0)
Nitrite: NEGATIVE
PSA: 0.56 ng/mL (ref 0.10–4.00)
Platelets: 198 10*3/uL (ref 150.0–400.0)
Potassium: 4.3 meq/L (ref 3.5–5.1)
RDW: 16.2 % — ABNORMAL HIGH (ref 11.5–14.6)
Sodium: 142 meq/L (ref 135–145)
TSH: 3.54 microintl units/mL (ref 0.35–5.50)
Total Bilirubin: 0.6 mg/dL (ref 0.3–1.2)
Total CHOL/HDL Ratio: 5
VLDL: 32.4 mg/dL (ref 0.0–40.0)
WBC: 9.6 10*3/uL (ref 4.5–10.5)
pH: 5 (ref 5.0–8.0)

## 2010-04-20 ENCOUNTER — Ambulatory Visit: Payer: Self-pay | Admitting: Cardiovascular Disease

## 2010-04-30 ENCOUNTER — Ambulatory Visit: Payer: Self-pay | Admitting: Cardiology

## 2010-04-30 LAB — CONVERTED CEMR LAB: POC INR: 2.6

## 2010-05-15 ENCOUNTER — Ambulatory Visit: Payer: Self-pay | Admitting: Cardiovascular Disease

## 2010-05-15 LAB — CONVERTED CEMR LAB: POC INR: 3.4

## 2010-05-29 ENCOUNTER — Ambulatory Visit: Payer: Self-pay | Admitting: Cardiology

## 2010-05-29 LAB — CONVERTED CEMR LAB: POC INR: 2.5

## 2010-06-12 ENCOUNTER — Encounter: Payer: Self-pay | Admitting: Internal Medicine

## 2010-07-06 ENCOUNTER — Ambulatory Visit: Payer: Self-pay | Admitting: Cardiovascular Disease

## 2010-07-24 ENCOUNTER — Ambulatory Visit: Payer: Self-pay | Admitting: Cardiology

## 2010-07-24 LAB — CONVERTED CEMR LAB: POC INR: 1.7

## 2010-09-17 ENCOUNTER — Ambulatory Visit: Payer: Self-pay | Admitting: Internal Medicine

## 2010-09-17 LAB — CONVERTED CEMR LAB: POC INR: 1.6

## 2010-10-01 ENCOUNTER — Ambulatory Visit: Payer: Self-pay | Admitting: Internal Medicine

## 2010-10-01 LAB — CONVERTED CEMR LAB: POC INR: 2.5

## 2010-10-06 ENCOUNTER — Ambulatory Visit: Payer: Self-pay | Admitting: Internal Medicine

## 2010-10-13 ENCOUNTER — Ambulatory Visit: Payer: Self-pay | Admitting: Internal Medicine

## 2010-10-22 ENCOUNTER — Ambulatory Visit: Payer: Self-pay | Admitting: Cardiovascular Disease

## 2010-10-22 LAB — CONVERTED CEMR LAB: POC INR: 1.8

## 2010-11-05 ENCOUNTER — Ambulatory Visit: Payer: Self-pay | Admitting: Cardiovascular Disease

## 2010-11-05 LAB — CONVERTED CEMR LAB: POC INR: 2.4

## 2010-11-24 ENCOUNTER — Ambulatory Visit: Payer: Self-pay | Admitting: Internal Medicine

## 2010-11-24 LAB — CONVERTED CEMR LAB
BUN: 25 mg/dL — ABNORMAL HIGH (ref 6–23)
Cholesterol: 112 mg/dL (ref 0–200)
Creatinine, Ser: 1 mg/dL (ref 0.4–1.5)
GFR calc non Af Amer: 102.62 mL/min (ref >60.00)
Potassium: 4.8 meq/L (ref 3.5–5.1)
Triglycerides: 34 mg/dL (ref 0.0–149.0)
VLDL: 6.8 mg/dL (ref 0.0–40.0)

## 2010-11-26 ENCOUNTER — Ambulatory Visit: Payer: Self-pay | Admitting: Cardiology

## 2010-11-26 LAB — CONVERTED CEMR LAB: POC INR: 2.1

## 2010-12-17 ENCOUNTER — Encounter: Payer: Self-pay | Admitting: Internal Medicine

## 2010-12-24 ENCOUNTER — Ambulatory Visit: Admission: RE | Admit: 2010-12-24 | Discharge: 2010-12-24 | Payer: Self-pay | Source: Home / Self Care

## 2010-12-24 LAB — CONVERTED CEMR LAB: POC INR: 2.9

## 2011-01-05 NOTE — Medication Information (Signed)
Summary: [facility]   Imported By: Lester Gann 01/26/2010 10:13:11  _____________________________________________________________________  External Attachment:    Type:   Image     Comment:   External Document

## 2011-01-05 NOTE — Assessment & Plan Note (Signed)
Summary: LOW BP PER DR GESSNER/NWS   Vital Signs:  Patient profile:   55 year old male Height:      72 inches Weight:      380.13 pounds BMI:     51.74 O2 Sat:      97 % on Room air Temp:     97.5 degrees F oral Pulse rate:   77 / minute BP sitting:   90 / 62  (left arm) Cuff size:   large  Vitals Entered ByMarland Kitchen Zella Ball Ewing (January 30, 2010 11:40 AM)  O2 Flow:  Room air CC: Low Blood Pressure, stomach still hurting/RE   Primary Care Provider:  Oliver Barre, MD  CC:  Low Blood Pressure and stomach still hurting/RE.  History of Present Illness: here after recent febrile illness in the past wk wtih nausea, decreased by mouth intake, low grade temp, crampy abd pains and loose stool;  no vomiring or abd distension; no blood;  has been dizzy and weak,  found to have low BP yest with orthostasis;  diuretics held ;  pt still with symptoms today but able to take by mouth better, keep fluids down, no increased pain, vomiting or diarrhea or blood and no falls or syncope;  Pt denies CP, sob, doe, wheezing, orthopnea, pnd, worsening LE edema, palps, or syncope . Course somewhat complicated  by recent increased a1c but pt adamant in the past few days has been two times a day low 100's.  Denies polydipsia or polyuria.  No chills, rash, joint pain or swel ing.  Needs work note to be excused.    Problems Prior to Update: 1)  Hypotension, Orthostatic  (ICD-458.0) 2)  Constipation  (ICD-564.00) 3)  Coumadin Therapy  (ICD-V58.61) 4)  Obesity  (ICD-278.00) 5)  Abdominal Pain-llq  (ICD-789.04) 6)  Abdominal Pain, Generalized  (ICD-789.07) 7)  Back Pain  (ICD-724.5) 8)  Degenerative Joint Disease, Knees, Bilateral  (ICD-715.96) 9)  Wheezing  (ICD-786.07) 10)  Smoker  (ICD-305.1) 11)  Wheezing  (ICD-786.07) 12)  Leg Pain, Left  (ICD-729.5) 13)  Obesity, Morbid  (ICD-278.01) 14)  Hypogonadism, Male  (ICD-257.2) 15)  Uti  (ICD-599.0) 16)  Colonic Polyps, Hx of  (ICD-V12.72) 17)  Renal Insufficiency   (ICD-588.9) 18)  Depression  (ICD-311) 19)  Diverticulosis, Colon  (ICD-562.10) 20)  Hypertension  (ICD-401.9) 21)  Hyperlipidemia  (ICD-272.4) 22)  Diabetes Mellitus, Type II  (ICD-250.00)  Medications Prior to Update: 1)  Actos 45 Mg Tabs (Pioglitazone Hcl) .Marland Kitchen.. 1 Tablet By Mouth Once A Day 2)  Aurora Lancet Super Thin 30g  Misc (Lancets) .... Use 1 Stick As Directed Three Times A Day 3)  Simvastatin 80 Mg  Tabs (Simvastatin) .Marland Kitchen.. 1 By Mouth Once Daily 4)  Onetouch Ultra Test  Strp (Glucose Blood) .... Use 1 Strip Three Times A Day 5)  Zetia 10 Mg Tabs (Ezetimibe) .... Take 1 Tablet By Mouth Once A Day 6)  Glipizide 10 Mg Tabs (Glipizide) .Marland Kitchen.. 1 By Mouth Two Times A Day 7)  Lisinopril 10 Mg  Tabs (Lisinopril) .Marland Kitchen.. 1 By Mouth Once Daily--Hold 8)  Ecotrin Low Strength 81 Mg  Tbec (Aspirin) .Marland Kitchen.. 1 By Mouth Qd 9)  Spironolactone-Hctz 25-25 Mg  Tabs (Spironolactone-Hctz) .Marland Kitchen.. 1 By Mouth Once Daily--Hold 10)  Tramadol Hcl 50 Mg Tabs (Tramadol Hcl) .Marland Kitchen.. 1 - 2 By Mouth Four Times Per Day As Needed For Pain 11)  Aciphex 20 Mg Tbec (Rabeprazole Sodium) .Marland Kitchen.. 1 By Mouth Once Daily 12)  Coumadin 5  Mg Tabs (Warfarin Sodium) .... Take As Directed By Coumadin Clinic. 13)  Proair Hfa 108 (90 Base) Mcg/act Aers (Albuterol Sulfate) .... 2 Puffs Four Times Per Day As Needed For Shortness of Breath 14)  Oxycodone Hcl 5 Mg Caps (Oxycodone Hcl) .Marland Kitchen.. 1 - 3 By Mouth Q 6 Hrs As Needed Pain 15)  Flexeril 5 Mg Tabs (Cyclobenzaprine Hcl) .Marland Kitchen.. 1 By Mouth Three Times A Day As Needed 16)  Tramadol Hcl 50 Mg Tabs (Tramadol Hcl) .... Take 1 Tab Dailyprn 17)  Promethazine Hcl 25 Mg Tabs (Promethazine Hcl) .Marland Kitchen.. 1po Q 6 Hrs As Needed Nausea 18)  Miralax  Powd (Polyethylene Glycol 3350) .... Mix 1 Capful (17 Grams) in 8 Oz Water Daily 19)  Phillips Milk of Magnesia 7.75 % Susp (Magnesium Hydroxide) .... Take Capful As Needed 20)  Metronidazole 500 Mg Tabs (Metronidazole) .... Take 1 Tablet By Mouth Two Times A Day For 10  Days 21)  Cipro 500 Mg Tabs (Ciprofloxacin Hcl) .... Take 1 Tablet By Mouth Two Times A Day For 10 Days  Current Medications (verified): 1)  Actos 45 Mg Tabs (Pioglitazone Hcl) .Marland Kitchen.. 1 Tablet By Mouth Once A Day 2)  Aurora Lancet Super Thin 30g  Misc (Lancets) .... Use 1 Stick As Directed Three Times A Day 3)  Simvastatin 80 Mg  Tabs (Simvastatin) .Marland Kitchen.. 1 By Mouth Once Daily 4)  Onetouch Ultra Test  Strp (Glucose Blood) .... Use 1 Strip Three Times A Day 5)  Zetia 10 Mg Tabs (Ezetimibe) .... Take 1 Tablet By Mouth Once A Day 6)  Glipizide 10 Mg Tabs (Glipizide) .Marland Kitchen.. 1 By Mouth Two Times A Day 7)  Lisinopril 10 Mg  Tabs (Lisinopril) .Marland Kitchen.. 1 By Mouth Once Daily--Hold 8)  Ecotrin Low Strength 81 Mg  Tbec (Aspirin) .Marland Kitchen.. 1 By Mouth Qd 9)  Spironolactone-Hctz 25-25 Mg  Tabs (Spironolactone-Hctz) .Marland Kitchen.. 1 By Mouth Once Daily--Hold 10)  Tramadol Hcl 50 Mg Tabs (Tramadol Hcl) .Marland Kitchen.. 1 - 2 By Mouth Four Times Per Day As Needed For Pain 11)  Aciphex 20 Mg Tbec (Rabeprazole Sodium) .Marland Kitchen.. 1 By Mouth Once Daily 12)  Coumadin 5 Mg Tabs (Warfarin Sodium) .... Take As Directed By Coumadin Clinic. 13)  Proair Hfa 108 (90 Base) Mcg/act Aers (Albuterol Sulfate) .... 2 Puffs Four Times Per Day As Needed For Shortness of Breath 14)  Oxycodone Hcl 5 Mg Caps (Oxycodone Hcl) .Marland Kitchen.. 1 - 3 By Mouth Q 6 Hrs As Needed Pain 15)  Flexeril 5 Mg Tabs (Cyclobenzaprine Hcl) .Marland Kitchen.. 1 By Mouth Three Times A Day As Needed 16)  Tramadol Hcl 50 Mg Tabs (Tramadol Hcl) .... Take 1 Tab Dailyprn 17)  Promethazine Hcl 25 Mg Tabs (Promethazine Hcl) .Marland Kitchen.. 1po Q 6 Hrs As Needed Nausea 18)  Miralax  Powd (Polyethylene Glycol 3350) .... Mix 1 Capful (17 Grams) in 8 Oz Water Daily 19)  Phillips Milk of Magnesia 7.75 % Susp (Magnesium Hydroxide) .... Take Capful As Needed 20)  Metronidazole 500 Mg Tabs (Metronidazole) .... Take 1 Tablet By Mouth Two Times A Day For 10 Days 21)  Cipro 500 Mg Tabs (Ciprofloxacin Hcl) .... Take 1 Tablet By Mouth Two Times A  Day For 10 Days  Allergies (verified): No Known Drug Allergies  Past History:  Past Medical History: Last updated: 01/21/2010 Diabetes mellitus, type II Hyperlipidemia Hypertension kidney stones,knee surgery,ankel surgery,bone spurs,torn ligaent Obstructive Sleep Apnea DVT- R Lower Extremity Pulmonary Embolus Diverticulosis, colon Depression Renal insufficiency Colonic polyps, hx of hypogonadism Obesity  Past Surgical History: Last updated: 01/21/2010 Knee Surgery- 2000 & 2003, cartilage damage Tonsillectomy and Adenoidectomy Ankle Surgery with bone spurs and torn tendon- 2001 bilateral  COLONOSCOPY 2005-DIVERTICULOSIS/HYPERPLASTIS POLYPS-DUE FOR F/U 2015 EGD 2008-CHRONIC DUODENITIS  Social History: Last updated: 01/21/2010 Former Smoker Alcohol use-no Divorced 2 children work - back to work at [employer] since jan 2010 Daily Caffeine Use one per day  Risk Factors: Smoking Status: quit (10/05/2007)  Review of Systems       all otherwise negative per pt -  Physical Exam  General:  alert and overweight-appearing., mild ill appearing Head:  normocephalic and atraumatic.   Eyes:  vision grossly intact, pupils equal, and pupils round.   Ears:  bilat tm's mild red Nose:  nasal dischargemucosal pallor and mucosal edema.   Mouth:  pharyngeal erythema and fair dentition.   Neck:  supple and no masses.   Lungs:  normal respiratory effort and normal breath sounds.   Heart:  normal rate and regular rhythm.   Abdomen:  soft, normal bowel sounds, and no guarding.   Msk:  no joint tenderness and no joint swelling.   Extremities:  no edema, no erythema  Skin:  color normal and no rashes.     Impression & Recommendations:  Problem # 1:  HYPOTENSION, ORTHOSTATIC (ICD-458.0) most likely due to acute illness prob viral AGE with ? improving symptoms;  to hold diuretics further, ok to push fluids and tylenol at this time;    Problem # 2:  DIABETES MELLITUS,  TYPE II (ICD-250.00)  His updated medication list for this problem includes:    Actos 45 Mg Tabs (Pioglitazone hcl) .Marland Kitchen... 1 tablet by mouth once a day    Glipizide 10 Mg Tabs (Glipizide) .Marland Kitchen... 1 by mouth two times a day    Lisinopril 10 Mg Tabs (Lisinopril) .Marland Kitchen... 1 by mouth once daily--hold    Ecotrin Low Strength 81 Mg Tbec (Aspirin) .Marland Kitchen... 1 by mouth qd unclear control I think given the recent elev a1c, but pt states two times a day cbg's are low 100's without low sugars; to cont meds as is but for any low sugars should hold at least the glipizide and call; o/w to cont as is to avoid hyperglycemia and exac of diuretic effect and dehydration  Problem # 3:  DEHYDRATION (ICD-276.51) as above, to f/u one wk  Complete Medication List: 1)  Actos 45 Mg Tabs (Pioglitazone hcl) .Marland Kitchen.. 1 tablet by mouth once a day 2)  Aurora Lancet Super Thin 30g Misc (Lancets) .... Use 1 stick as directed three times a day 3)  Simvastatin 80 Mg Tabs (Simvastatin) .Marland Kitchen.. 1 by mouth once daily 4)  Onetouch Ultra Test Strp (Glucose blood) .... Use 1 strip three times a day 5)  Zetia 10 Mg Tabs (Ezetimibe) .... Take 1 tablet by mouth once a day 6)  Glipizide 10 Mg Tabs (Glipizide) .Marland Kitchen.. 1 by mouth two times a day 7)  Lisinopril 10 Mg Tabs (Lisinopril) .Marland Kitchen.. 1 by mouth once daily--hold 8)  Ecotrin Low Strength 81 Mg Tbec (Aspirin) .Marland Kitchen.. 1 by mouth qd 9)  Spironolactone-hctz 25-25 Mg Tabs (Spironolactone-hctz) .Marland Kitchen.. 1 by mouth once daily--hold 10)  Tramadol Hcl 50 Mg Tabs (Tramadol hcl) .Marland Kitchen.. 1 - 2 by mouth four times per day as needed for pain 11)  Aciphex 20 Mg Tbec (Rabeprazole sodium) .Marland Kitchen.. 1 by mouth once daily 12)  Coumadin 5 Mg Tabs (Warfarin sodium) .... Take as directed by coumadin clinic. 13)  Proair Hfa  108 (90 Base) Mcg/act Aers (Albuterol sulfate) .... 2 puffs four times per day as needed for shortness of breath 14)  Oxycodone Hcl 5 Mg Caps (Oxycodone hcl) .Marland Kitchen.. 1 - 3 by mouth q 6 hrs as needed pain 15)  Flexeril 5 Mg  Tabs (Cyclobenzaprine hcl) .Marland Kitchen.. 1 by mouth three times a day as needed 16)  Tramadol Hcl 50 Mg Tabs (Tramadol hcl) .... Take 1 tab dailyprn 17)  Promethazine Hcl 25 Mg Tabs (Promethazine hcl) .Marland Kitchen.. 1po q 6 hrs as needed nausea 18)  Miralax Powd (Polyethylene glycol 3350) .... Mix 1 capful (17 grams) in 8 oz water daily 19)  Phillips Milk of Magnesia 7.75 % Susp (Magnesium hydroxide) .... Take capful as needed  Patient Instructions: 1)  stop the lisinopril 2)  stop the spironolactone-HCTZ 3)  drink more fluids in the next week 4)  Continue all previous medications as before this visit  5)  Please schedule a follow-up appointment in 1 weeks, or sooner if needed 6)  You are given the work note today 7)  please monitor your blood sugars as well, as the higher sugars can lead to dehydration as well  Appended Document: LOW BP PER DR GESSNER/NWS we will check on him in 1 week after REV with Dr. Jonny Ruiz and arrange GI f/u  Appended Document: LOW BP PER DR GESSNER/NWS Patient with continued stomach pain, some better then it was last week.  He reports "pain comes in waves".  Constipation has improved. Darcey Nora RN, [facility]  February 06, 2010 9:04 AM     Clinical Lists Changes  He was supposed to see Dr. Jonny Ruiz in 1 weeek f/u but I do not see an appt.  He will need a follow-up with me later this month. Iva Boop MD, [facility]  February 06, 2010 3:41 PM    I have left a message for the patient to call me back to schedule REV. Darcey Nora RN, [facility]  February 09, 2010 11:17 AM    REV scheduled for 03-02-10 9:30 Darcey Nora RN, [facility]  February 10, 2010 9:40 AM

## 2011-01-05 NOTE — Letter (Signed)
Summary: Out of Work  [facility]  [address] [place], [place] [address]   Phone: [phone]  Fax: [phone]    January 30, 2010   Employee:  [name]    To Whom It May Concern:   For Medical reasons, please Extend the excused dates for the above named employee from work for the following dates:  Start:   Jan 26, 2010  End:   February 08, 2010   -    to return to work February 09, 2010 without restriction  If you need additional information, please feel free to contact our office.         Sincerely,    Corwin Levins MD

## 2011-01-05 NOTE — Letter (Signed)
Summary: Generic Letter  [facility]  [address] [place], [place] [address]   Phone: [phone]  Fax: [phone]    02/13/2010  [facility] [address] [place], [place]  [address]  Dear Mr. Tourigny,       This letter is to certify that you are able to return to work  on Monday Feb 16, 2010, without restriction.         Sincerely,   Oliver Barre MD

## 2011-01-05 NOTE — Medication Information (Signed)
Summary: ROV-MJ  Anticoagulant Therapy  Managed by: Cloyde Reams, RN, BSN Referring MD: Nona Dell PCP: Oliver Barre, MD Supervising MD: Ladona Ridgel MD, Sharlot Gowda Indication 1: Pulmonary Embolism and Infarction (ICD-415.1) Lab Used: [facility] Site: [facility] INR POC 1.6 INR RANGE 2 - 3  Dietary changes: no    Health status changes: no    Bleeding/hemorrhagic complications: no    Recent/future hospitalizations: no    Any changes in medication regimen? yes       Details: Taking hydrocodone cough syrup at present.    Recent/future dental: no  Any missed doses?: no       Is patient compliant with meds? yes       Allergies: No Known Drug Allergies  Anticoagulation Management History:      The patient is taking warfarin and comes in today for a routine follow up visit.  Positive risk factors for bleeding include presence of serious comorbidities.  Negative risk factors for bleeding include an age less than 88 years old.  The bleeding index is 'intermediate risk'.  Positive CHADS2 values include History of HTN and History of Diabetes.  Negative CHADS2 values include Age > 67 years old.  The start date was 08/05/2005.  Anticoagulation responsible provider: Ladona Ridgel MD, Sharlot Gowda.  INR POC: 1.6.  Cuvette Lot#: [id number].  Exp: 10/2011.    Anticoagulation Management Assessment/Plan:      The patient's current anticoagulation dose is Coumadin 5 mg tabs: Take as directed by coumadin clinic..  The target INR is 2 - 3.  The next INR is due 10/01/2010.  Anticoagulation instructions were given to patient.  Results were reviewed/authorized by Cloyde Reams, RN, BSN.  He was notified by Cloyde Reams RN.         Prior Anticoagulation Instructions: INR 1.7  Take another tablet (5mg ) today, August 19th.  Then resume Coumadin schedule of taking 2 tablets (10mg ) every day except take 2.5 tablets (12.5mg ) on Tuesdays.  Recheck in 3 weeks.   Current Anticoagulation Instructions: INR 1.6  Take an  extra 5mg  today, then start taking 10mg  daily except 12.5mg  on Tuesdays, Thursdays, and Saturdays. Recheck in 2 weeks.

## 2011-01-05 NOTE — Progress Notes (Signed)
Summary: XRAY  Phone Note From Other Clinic   Summary of Call: Xray called, pt is >300 lbs and unable to do xray here. Unable to do standing abd film and will sent pt to hospital with emr order for xray.  Initial call taken by: Lamar Sprinkles, CMA,  January 12, 2010 12:39 PM  Follow-up for Phone Call        ok for upright and cxr only as d/w sara to inform radiology  Additional Follow-up for Phone Call Additional follow up Details #1::        Radiology only needed EMR order, Our radiology dept sent pt over Additional Follow-up by: Lamar Sprinkles, CMA,  January 12, 2010 2:19 PM

## 2011-01-05 NOTE — Medication Information (Signed)
Summary: rov/cb  Anticoagulant Therapy  Managed by: Weston Brass, PharmD Referring MD: Nona Dell PCP: Oliver Barre, MD Supervising MD: Tenny Craw MD, Gunnar Fusi Indication 1: Pulmonary Embolism and Infarction (ICD-415.1) Lab Used: [facility] Site: [facility] INR POC 4.7 INR RANGE 2 - 3  Dietary changes: no    Health status changes: no    Bleeding/hemorrhagic complications: no    Recent/future hospitalizations: no    Any changes in medication regimen? no    Recent/future dental: yes     Details: having root canal in near future  Any missed doses?: no       Is patient compliant with meds? yes       Allergies: No Known Drug Allergies  Anticoagulation Management History:      The patient is taking warfarin and comes in today for a routine follow up visit.  Positive risk factors for bleeding include presence of serious comorbidities.  Negative risk factors for bleeding include an age less than 52 years old.  The bleeding index is 'intermediate risk'.  Positive CHADS2 values include History of HTN and History of Diabetes.  Negative CHADS2 values include Age > 55 years old.  The start date was 08/05/2005.  Anticoagulation responsible provider: Tenny Craw MD, Gunnar Fusi.  INR POC: 4.7.  Cuvette Lot#: [id number].  Exp: 05/2011.    Anticoagulation Management Assessment/Plan:      The patient's current anticoagulation dose is Coumadin 5 mg tabs: Take as directed by coumadin clinic..  The target INR is 2 - 3.  The next INR is due 04/20/2010.  Anticoagulation instructions were given to patient.  Results were reviewed/authorized by Weston Brass, PharmD.  He was notified by Weston Brass PharmD.         Prior Anticoagulation Instructions: INR 3.6. Hold Coumadin tomorrow, then take 2 tablets daily except 2.5 tablets on Tues, Thurs, Sat.  Current Anticoagulation Instructions: INR 4.7  Skip tomorrow's dose of Coumadin then take only 1 tablet on Sunday and decrease dose to 2 tablets every day except 2 1/2 tablets  on Tuesday

## 2011-01-05 NOTE — Letter (Signed)
Summary: [facility] Instructions  [facility]  [address] [place], [place] [address]   Phone: [phone]  Fax: [phone]       [name]    June 12, 1956    MRN: [id number]        Procedure Day [name]:  [name]  03/18/10     Arrival Time:  12:00PM     Procedure Time:  1:00PM     Location of Procedure:                     [name]  [name]                        PREPARATION FOR COLONOSCOPY WITH MOVIPREP   Starting 5 days prior to your procedure 03/13/10 do not eat nuts, seeds, popcorn, corn, beans, peas,  salads, or any raw vegetables.  Do not take any fiber supplements (e.g. Metamucil, Citrucel, and Benefiber).  THE DAY BEFORE YOUR PROCEDURE         DATE: 03/17/10  DAY: TUESDAY  1.  Drink clear liquids the entire day-NO SOLID FOOD  2.  Do not drink anything colored red or purple.  Avoid juices with pulp.  No orange juice.  3.  Drink at least 64 oz. (8 glasses) of fluid/clear liquids during the day to prevent dehydration and help the prep work efficiently.  CLEAR LIQUIDS INCLUDE: Water Jello Ice Popsicles Tea (sugar ok, no milk/cream) Powdered fruit flavored drinks Coffee (sugar ok, no milk/cream) Gatorade Juice: apple, white grape, white cranberry  Lemonade Clear bullion, consomm, broth Carbonated beverages (any kind) Strained chicken noodle soup Hard Candy                             4.  In the morning, mix first dose of MoviPrep solution:    Empty 1 Pouch A and 1 Pouch B into the disposable container    Add lukewarm drinking water to the top line of the container. Mix to dissolve    Refrigerate (mixed solution should be used within 24 hrs)  5.  Begin drinking the prep at 5:00 p.m. The MoviPrep container is divided by 4 marks.   Every 15 minutes drink the solution down to the next mark (approximately 8 oz) until the full liter is complete.   6.  Follow completed prep with 16 oz of clear liquid of your  choice (Nothing red or purple).  Continue to drink clear liquids until bedtime.  7.  Before going to bed, mix second dose of MoviPrep solution:    Empty 1 Pouch A and 1 Pouch B into the disposable container    Add lukewarm drinking water to the top line of the container. Mix to dissolve    Refrigerate  THE DAY OF YOUR PROCEDURE      DATE: 03/18/10 DAY: WEDNESDAY  Beginning at 8:00AM (5 hours before procedure):         1. Every 15 minutes, drink the solution down to the next mark (approx 8 oz) until the full liter is complete.  2. Follow completed prep with 16 oz. of clear liquid of your choice.    3. You may drink clear liquids until 9:00AM (4HOURS BEFORE PROCEDURE).   MEDICATION INSTRUCTIONS  Unless otherwise instructed, you should take regular prescription medications with a small sip of water   as early as possible the morning  of your procedure.  Hold HCTZ the morning of procedure.  Take Lisinopril the morning of procedure.  Diabetic patients - see separate instructions.     1. Stop taking Coumadin on  03/13/10  (5 days before procedure). 2. Enoxaparin (Lovenox) 150mg  subcutaneously two times a day for the 5 days prior to the procedure, with last dosing the morning of the day prior to the procedure. 3. Restart Lovenox (Enoxaparin) the evening after the procedure, or at [facility] discretion, for total 6 doses after the procedure. 4. Restart Coumadin the day after the procedure, usual dosing. 5. See Coumadin Clinic 3 days ( or next day able) after the proceure to check INR.           OTHER INSTRUCTIONS  You will need a responsible adult at least 57 years of age to accompany you and drive you home.   This person must remain in the waiting room during your procedure.  Wear loose fitting clothing that is easily removed.  Leave jewelry and other valuables at home.  However, you may wish to bring a book to read or  an iPod/MP3 player to listen to music as you wait for your  procedure to start.  Remove all body piercing jewelry and leave at home.  Total time from sign-in until discharge is approximately 2-3 hours.  You should go home directly after your procedure and rest.  You can resume normal activities the  day after your procedure.  The day of your procedure you should not:   Drive   Make legal decisions   Operate machinery   Drink alcohol   Return to work  You will receive specific instructions about eating, activities and medications before you leave.    The above instructions have been reviewed and explained to me by   [name]  March 12, 2010 1:40 PM     I fully understand and can verbalize these instructions _____________________________ Date _________

## 2011-01-05 NOTE — Medication Information (Signed)
Summary: [name]  Medications Added TRAMADOL HCL 50 MG TABS (TRAMADOL HCL) Take 1 tab dailyprn      Allergies Added: NKDA Anticoagulant Therapy  Managed by: Bethanne Ginger, PharmD Referring MD: Nona Dell Supervising MD: Shirlee Latch MD, Chealsey Miyamoto Indication 1: Pulmonary Embolism and Infarction (ICD-415.1) Lab Used: [facility] Site: [facility] INR POC 3.0 INR RANGE 2 - 3  Dietary changes: no    Health status changes: yes       Details: Went to MD for back strain.   Bleeding/hemorrhagic complications: no    Recent/future hospitalizations: no    Any changes in medication regimen? no    Recent/future dental: no  Any missed doses?: no       Is patient compliant with meds? yes      Comments: Was started on prednisone taper for back strain.  Completed the prednisone on 1/29.  Current Medications (verified): 1)  Actos 45 Mg Tabs (Pioglitazone Hcl) .Marland Kitchen.. 1 Tablet By Mouth Once A Day 2)  Aurora Lancet Super Thin 30g  Misc (Lancets) .... Use 1 Stick As Directed Three Times A Day 3)  Simvastatin 80 Mg  Tabs (Simvastatin) .Marland Kitchen.. 1 By Mouth Once Daily 4)  Onetouch Ultra Test  Strp (Glucose Blood) .... Use 1 Strip Three Times A Day 5)  Zetia 10 Mg Tabs (Ezetimibe) .... Take 1 Tablet By Mouth Once A Day 6)  Diabeta 5 Mg  Tabs (Glyburide) .Marland Kitchen.. 1 By Mouth Once Daily 7)  Lisinopril 10 Mg  Tabs (Lisinopril) .Marland Kitchen.. 1 By Mouth Once Daily 8)  Ecotrin Low Strength 81 Mg  Tbec (Aspirin) .Marland Kitchen.. 1 By Mouth Qd 9)  Spironolactone-Hctz 25-25 Mg  Tabs (Spironolactone-Hctz) .Marland Kitchen.. 1 By Mouth Once Daily 10)  Tramadol Hcl 50 Mg Tabs (Tramadol Hcl) .Marland Kitchen.. 1 - 2 By Mouth Four Times Per Day As Needed For Pain 11)  Aciphex 20 Mg Tbec (Rabeprazole Sodium) .Marland Kitchen.. 1 By Mouth Once Daily 12)  Coumadin 5 Mg Tabs (Warfarin Sodium) .... Take As Directed By [facility]madin Clinic. 13)  Proair Hfa 108 (90 Base) Mcg/act Aers (Albuterol Sulfate) .... 2 Puffs Four Times Per Day As Needed For Shortness of Breath 14)  Oxycodone Hcl 5 Mg  Caps (Oxycodone Hcl) .Marland Kitchen.. 1 - 3 By Mouth Q 6 Hrs As Needed Pain 15)  Flexeril 5 Mg Tabs (Cyclobenzaprine Hcl) .Marland Kitchen.. 1 By Mouth Three Times A Day As Needed 16)  Tramadol Hcl 50 Mg Tabs (Tramadol Hcl) .... Take 1 Tab Dailyprn  Allergies (verified): No Known Drug Allergies  Anticoagulation Management History:      The patient is taking warfarin and comes in today for a routine follow up visit.  Positive risk factors for bleeding include presence of serious comorbidities.  Negative risk factors for bleeding include an age less than 39 years old.  The bleeding index is 'intermediate risk'.  Positive CHADS2 values include History of HTN and History of Diabetes.  Negative CHADS2 values include Age > 43 years old.  The start date was 08/05/2005.  Anticoagulation responsible provider: Shirlee Latch MD, Ardean Melroy.  INR POC: 3.0.  Cuvette Lot#: [id number].  Exp: 03/2011.    Anticoagulation Management Assessment/Plan:      The patient's current anticoagulation dose is Coumadin 5 mg tabs: Take as directed by coumadin clinic..  The target INR is 2 - 3.  The next INR is due 02/02/2010.  Anticoagulation instructions were given to patient.  Results were reviewed/authorized by Bethanne Ginger, PharmD.  He was notified by Bethanne Ginger.  Prior Anticoagulation Instructions: INR 3.1  Take 1 tablet tomorrow then resume same dosage 2 tablets daily except 2.5 tablets on Mondays, Wednesdays, and Fridays.  Recheck in 4 weeks.    Current Anticoagulation Instructions: INR = 3.0 The patient is to continue with the same dose of coumadin.  This dosage includes:  2 tablets on Sun, Tue, Thur, Sat 2 1/2 tablets on Mon, Wed, Fri

## 2011-01-05 NOTE — Letter (Signed)
Summary: Out of Work  [facility]  [address] [place], [place] [address]   Phone: [phone]  Fax: [phone]    January 12, 2010   Employee:  [name]    To Whom It May Concern:   For Medical reasons, please excuse the above named employee from work for the following dates:  Start:   feb 7 and 8, 2011 due to illness    If you need additional information, please feel free to contact our office.         Sincerely,    Corwin Levins MD

## 2011-01-05 NOTE — Assessment & Plan Note (Signed)
Summary: 6 MTH FU  STC   Vital Signs:  Patient profile:   55 year old male Height:      74 inches Weight:      364.38 pounds BMI:     46.95 O2 Sat:      98 % on Room air Temp:     98.4 degrees F oral Pulse rate:   78 / minute BP sitting:   110 / 64  (left arm) Cuff size:   large  Vitals Entered By: Zella Ball Ewing CMA Duncan Dull) (October 13, 2010 10:29 AM)  O2 Flow:  Room air  CC: 6 month followup/RE   Primary Care Provider:  Oliver Barre, MD  CC:  6 month followup/RE.  History of Present Illness: here to f/u; with phentermine has done well with wt loss from 390 to current 364;  feels much more energy, less sob/doe;  Pt denies CP, worsening sob, doe, wheezing, orthopnea, pnd, worsening LE edema, palps, dizziness or syncope  Pt denies new neuro symptoms such as headache, facial or extremity weakness  Pt denies polydipsia, polyuria, or low sugar symptoms such as shakiness improved with eating.  Overall good compliance with meds, trying to follow low chol, DM diet, wt stable, little excercise however No fever, wt loss, night sweats, loss of appetite or other constitutional symptoms  Denies worsening depressive symptoms, suicidal ideation, or panic.  Knee pain overall improved with wt loss as well.  Pt does have concern about cont'd use of the actos as he has seen ads on TV for lawyers and actos related bladder cancer.  No overt bleeding or bruising.  Preventive Screening-Counseling & Management      Drug Use:  no.    Problems Prior to Update: 1)  Preventive Health Care  (ICD-V70.0) 2)  Diab w/o Mention Comp Type Ii/uns Type Uncntrl  (ICD-250.02) 3)  Change in Bowels  (ICD-787.99) 4)  Dehydration  (ICD-276.51) 5)  Hypotension, Orthostatic  (ICD-458.0) 6)  Constipation  (ICD-564.00) 7)  Coumadin Therapy  (ICD-V58.61) 8)  Obesity  (ICD-278.00) 9)  Abdominal Pain-llq  (ICD-789.04) 10)  Abdominal Pain, Upper  (ICD-789.09) 11)  Back Pain  (ICD-724.5) 12)  Degenerative Joint Disease, Knees,  Bilateral  (ICD-715.96) 13)  Wheezing  (ICD-786.07) 14)  Smoker  (ICD-305.1) 15)  Wheezing  (ICD-786.07) 16)  Leg Pain, Left  (ICD-729.5) 17)  Obesity, Morbid  (ICD-278.01) 18)  Hypogonadism, Male  (ICD-257.2) 19)  Uti  (ICD-599.0) 20)  Colonic Polyps, Hx of  (ICD-V12.72) 21)  Renal Insufficiency  (ICD-588.9) 22)  Depression  (ICD-311) 23)  Diverticulosis, Colon  (ICD-562.10) 24)  Hypertension  (ICD-401.9) 25)  Hyperlipidemia  (ICD-272.4) 26)  Diabetes Mellitus, Type II  (ICD-250.00)  Medications Prior to Update: 1)  Actos 45 Mg Tabs (Pioglitazone Hcl) .Marland Kitchen.. 1 Tablet By Mouth Once A Day 2)  Aurora Lancet Super Thin 30g  Misc (Lancets) .... Use 1 Stick As Directed Three Times A Day 3)  Simvastatin 40 Mg Tabs (Simvastatin) .Marland Kitchen.. 1 By Mouth Once Daily 4)  Onetouch Ultra Test  Strp (Glucose Blood) .... Use 1 Strip Three Times A Day 5)  Zetia 10 Mg Tabs (Ezetimibe) .... Take 1 Tablet By Mouth Once A Day 6)  Glipizide 10 Mg Tabs (Glipizide) .Marland Kitchen.. 1 By Mouth Two Times A Day 7)  Lisinopril 5 Mg Tabs (Lisinopril) .Marland Kitchen.. 1 By Mouth Once Daily 8)  Ecotrin Low Strength 81 Mg  Tbec (Aspirin) .Marland Kitchen.. 1 By Mouth Qd 9)  Hydrochlorothiazide 25 Mg Tabs (Hydrochlorothiazide) .Marland KitchenMarland KitchenMarland Kitchen  1po Once Daily 10)  Tramadol Hcl 50 Mg Tabs (Tramadol Hcl) .Marland Kitchen.. 1 - 2 By Mouth Four Times Per Day As Needed For Pain 11)  Aciphex 20 Mg Tbec (Rabeprazole Sodium) .Marland Kitchen.. 1 By Mouth Once Daily 12)  Coumadin 5 Mg Tabs (Warfarin Sodium) .... Take As Directed By Coumadin Clinic. 13)  Proair Hfa 108 (90 Base) Mcg/act Aers (Albuterol Sulfate) .... 2 Puffs Four Times Per Day As Needed For Shortness of Breath 14)  Flexeril 5 Mg Tabs (Cyclobenzaprine Hcl) .Marland Kitchen.. 1 By Mouth Three Times A Day As Needed 15)  Promethazine Hcl 25 Mg Tabs (Promethazine Hcl) .Marland Kitchen.. 1po Q 6 Hrs As Needed Nausea 16)  Miralax  Powd (Polyethylene Glycol 3350) .... Mix 1 Capful (17 Grams) in 8 Oz Water Daily 17)  Phillips Milk of Magnesia 7.75 % Susp (Magnesium Hydroxide) ....  Take Capful As Needed 18)  Cymbalta 60 Mg Cpep (Duloxetine Hcl) .Marland Kitchen.. 1 By Mouth Once Daily 19)  Phentermine Hcl 37.5 Mg Caps (Phentermine Hcl) .Marland Kitchen.. 1po Once Daily 20)  Dicyclomine Hcl 20 Mg Tabs (Dicyclomine Hcl) .Marland Kitchen.. 1 By Mouth Every 6 Hrs As Needed For Abdominal Pain  Current Medications (verified): 1)  Kombiglyze Xr 04-999 Mg Xr24h-Tab (Saxagliptin-Metformin) .Marland Kitchen.. 1po Once Daily 2)  Aurora Lancet Super Thin 30g  Misc (Lancets) .... Use 1 Stick As Directed Three Times A Day 3)  Simvastatin 40 Mg Tabs (Simvastatin) .Marland Kitchen.. 1 By Mouth Once Daily 4)  Onetouch Ultra Test  Strp (Glucose Blood) .... Use 1 Strip Three Times A Day 5)  Zetia 10 Mg Tabs (Ezetimibe) .... Take 1 Tablet By Mouth Once A Day 6)  Glipizide 10 Mg Tabs (Glipizide) .Marland Kitchen.. 1 By Mouth Q Am Only 7)  Lisinopril 5 Mg Tabs (Lisinopril) .Marland Kitchen.. 1 By Mouth Once Daily 8)  Ecotrin Low Strength 81 Mg  Tbec (Aspirin) .Marland Kitchen.. 1 By Mouth Qd 9)  Hydrochlorothiazide 25 Mg Tabs (Hydrochlorothiazide) .Marland Kitchen.. 1po Once Daily 10)  Tramadol Hcl 50 Mg Tabs (Tramadol Hcl) .Marland Kitchen.. 1 - 2 By Mouth Four Times Per Day As Needed For Pain 11)  Aciphex 20 Mg Tbec (Rabeprazole Sodium) .Marland Kitchen.. 1 By Mouth Once Daily 12)  Coumadin 5 Mg Tabs (Warfarin Sodium) .... Take As Directed By Coumadin Clinic. 13)  Proair Hfa 108 (90 Base) Mcg/act Aers (Albuterol Sulfate) .... 2 Puffs Four Times Per Day As Needed For Shortness of Breath 14)  Flexeril 5 Mg Tabs (Cyclobenzaprine Hcl) .Marland Kitchen.. 1 By Mouth Three Times A Day As Needed 15)  Promethazine Hcl 25 Mg Tabs (Promethazine Hcl) .Marland Kitchen.. 1po Q 6 Hrs As Needed Nausea 16)  Miralax  Powd (Polyethylene Glycol 3350) .... Mix 1 Capful (17 Grams) in 8 Oz Water Daily 17)  Phillips Milk of Magnesia 7.75 % Susp (Magnesium Hydroxide) .... Take Capful As Needed 18)  Cymbalta 60 Mg Cpep (Duloxetine Hcl) .Marland Kitchen.. 1 By Mouth Once Daily 19)  Phentermine Hcl 37.5 Mg Caps (Phentermine Hcl) .Marland Kitchen.. 1po Once Daily 20)  Dicyclomine Hcl 20 Mg Tabs (Dicyclomine Hcl) .Marland Kitchen.. 1 By  Mouth Every 6 Hrs As Needed For Abdominal Pain  Allergies (verified): No Known Drug Allergies  Past History:  Past Medical History: Last updated: 01/21/2010 Diabetes mellitus, type II Hyperlipidemia Hypertension kidney stones,knee surgery,ankel surgery,bone spurs,torn ligaent Obstructive Sleep Apnea DVT- R Lower Extremity Pulmonary Embolus Diverticulosis, colon Depression Renal insufficiency Colonic polyps, hx of hypogonadism Obesity  Past Surgical History: Last updated: 03/02/2010 Knee Surgery- 2000 & 2003, cartilage damage Tonsillectomy and Adenoidectomy Ankle Surgery with bone spurs and  torn tendon- 2001 bilateral  COLONOSCOPY 2005-DIVERTICULOSIS/HYPERPLASTIC POLYPS-DUE FOR F/U 2015 EGD 2008-CHRONIC DUODENITIS  Social History: Last updated: 10/13/2010 Former Smoker Alcohol use-no Divorced 2 children work - back to work at [employer] since jan 2010 Daily Caffeine Use one per day Drug use-no  Risk Factors: Smoking Status: quit (10/05/2007)  Social History: Former Smoker Alcohol use-no Divorced 2 children work - back to work at [employer] since jan 2010 Daily Caffeine Use one per day Drug use-no Drug Use:  no  Review of Systems       all otherwise negative per pt -    Physical Exam  General:  alert and overweight-appearing, but less so Head:  normocephalic and atraumatic.   Eyes:  vision grossly intact, pupils equal, and pupils round.   Ears:  R ear normal and L ear normal.   Nose:  no external deformity and no nasal discharge.   Mouth:  no gingival abnormalities and pharynx pink and moist.   Neck:  supple and no masses.   Lungs:  normal respiratory effort and normal breath sounds.   Heart:  normal rate and regular rhythm.   Abdomen:  soft, non-tender, and normal bowel sounds.   Extremities:  no edema, no erythema    Impression & Recommendations:  Problem # 1:  DIABETES MELLITUS, TYPE II (ICD-250.00)  His updated  medication list for this problem includes:    Kombiglyze Xr 04-999 Mg Xr24h-tab (Saxagliptin-metformin) .Marland Kitchen... 1po once daily    Glipizide 10 Mg Tabs (Glipizide) .Marland Kitchen... 1 by mouth q am only    Lisinopril 5 Mg Tabs (Lisinopril) .Marland Kitchen... 1 by mouth once daily    Ecotrin Low Strength 81 Mg Tbec (Aspirin) .Marland Kitchen... 1 by mouth qd overall stable  - but change the actos to above due to concerns about risk, and decrase the glipizide due on oging wt loss program for safely;  f/u labs in 6 wks  Labs Reviewed: Creat: 1.3 (04/14/2010)    Reviewed HgBA1c results: 7.8 (04/14/2010)  8.0 (03/24/2010)  Problem # 2:  HYPERTENSION (ICD-401.9)  His updated medication list for this problem includes:    Lisinopril 5 Mg Tabs (Lisinopril) .Marland Kitchen... 1 by mouth once daily    Hydrochlorothiazide 25 Mg Tabs (Hydrochlorothiazide) .Marland Kitchen... 1po once daily  BP today: 110/64 Prior BP: 116/82 (04/14/2010)  Labs Reviewed: K+: 4.3 (04/14/2010) Creat: : 1.3 (04/14/2010)   Chol: 191 (04/14/2010)   HDL: 38.90 (04/14/2010)   LDL: 120 (04/14/2010)   TG: 162.0 (04/14/2010) stable overall by hx and exam, ok to continue meds/tx as is   Problem # 3:  HYPERLIPIDEMIA (ICD-272.4)  His updated medication list for this problem includes:    Simvastatin 40 Mg Tabs (Simvastatin) .Marland Kitchen... 1 by mouth once daily    Zetia 10 Mg Tabs (Ezetimibe) .Marland Kitchen... Take 1 tablet by mouth once a day  Labs Reviewed: SGOT: 25 (04/14/2010)   SGPT: 24 (04/14/2010)   HDL:38.90 (04/14/2010), 40.20 (03/24/2010)  LDL:120 (04/14/2010), 79 (95/62/1308)  Chol:191 (04/14/2010), 253 (03/24/2010)  Trig:162.0 (04/14/2010), 182.0 (03/24/2010) stable overall by hx and exam, ok to continue meds/tx as is   Problem # 4:  OBESITY (ICD-278.00) cont med for now;  f/u next visit  Complete Medication List: 1)  Kombiglyze Xr 04-999 Mg Xr24h-tab (Saxagliptin-metformin) .Marland Kitchen.. 1po once daily 2)  Aurora Lancet Super Thin 30g Misc (Lancets) .... Use 1 stick as directed three times a day 3)   Simvastatin 40 Mg Tabs (Simvastatin) .Marland Kitchen.. 1 by mouth once daily 4)  Onetouch  Ultra Test Strp (Glucose blood) .... Use 1 strip three times a day 5)  Zetia 10 Mg Tabs (Ezetimibe) .... Take 1 tablet by mouth once a day 6)  Glipizide 10 Mg Tabs (Glipizide) .Marland Kitchen.. 1 by mouth q am only 7)  Lisinopril 5 Mg Tabs (Lisinopril) .Marland Kitchen.. 1 by mouth once daily 8)  Ecotrin Low Strength 81 Mg Tbec (Aspirin) .Marland Kitchen.. 1 by mouth qd 9)  Hydrochlorothiazide 25 Mg Tabs (Hydrochlorothiazide) .Marland Kitchen.. 1po once daily 10)  Tramadol Hcl 50 Mg Tabs (Tramadol hcl) .Marland Kitchen.. 1 - 2 by mouth four times per day as needed for pain 11)  Aciphex 20 Mg Tbec (Rabeprazole sodium) .Marland Kitchen.. 1 by mouth once daily 12)  Coumadin 5 Mg Tabs (Warfarin sodium) .... Take as directed by coumadin clinic. 13)  Proair Hfa 108 (90 Base) Mcg/act Aers (Albuterol sulfate) .... 2 puffs four times per day as needed for shortness of breath 14)  Flexeril 5 Mg Tabs (Cyclobenzaprine hcl) .Marland Kitchen.. 1 by mouth three times a day as needed 15)  Promethazine Hcl 25 Mg Tabs (Promethazine hcl) .Marland Kitchen.. 1po q 6 hrs as needed nausea 16)  Miralax Powd (Polyethylene glycol 3350) .... Mix 1 capful (17 grams) in 8 oz water daily 17)  Phillips Milk of Magnesia 7.75 % Susp (Magnesium hydroxide) .... Take capful as needed 18)  Cymbalta 60 Mg Cpep (Duloxetine hcl) .Marland Kitchen.. 1 by mouth once daily 19)  Phentermine Hcl 37.5 Mg Caps (Phentermine hcl) .Marland Kitchen.. 1po once daily 20)  Dicyclomine Hcl 20 Mg Tabs (Dicyclomine hcl) .Marland Kitchen.. 1 by mouth every 6 hrs as needed for abdominal pain  Patient Instructions: 1)  stop the actos 2)  start the new kombyglize Er 04/999 mg- 1 per day 3)  Please return in 6 wks for LAB only: 4)  BMP prior to visit, ICD-9: 250.02 5)  Lipid Panel prior to visit, ICD-9: 6)  HbgA1C prior to visit, ICD-9: 7)  you are given the last refills of phentermine 8)  Continue all previous medications as before this visit, except decrease the glipizide to 10 mg in the AM only  9)  Please schedule a  follow-up appointment in 6 months with CPX labs  and: 10)  HbgA1C prior to visit, ICD-9: 250.02 11)  Urine Microalbumin prior to visit, ICD-9: Prescriptions: TRAMADOL HCL 50 MG TABS (TRAMADOL HCL) 1 - 2 by mouth four times per day as needed for pain  #240 x 5   Entered and Authorized by:   Corwin Levins MD   Signed by:   Corwin Levins MD on 10/13/2010   Method used:   Print then Give to Patient   RxID:   [id number] CYMBALTA 60 MG CPEP (DULOXETINE HCL) 1 by mouth once daily  #90 x 3   Entered and Authorized by:   Corwin Levins MD   Signed by:   Corwin Levins MD on 10/13/2010   Method used:   Print then Give to Patient   RxID:   [id number] PHENTERMINE HCL 37.5 MG CAPS (PHENTERMINE HCL) 1po once daily  #30 x 2   Entered and Authorized by:   Corwin Levins MD   Signed by:   Corwin Levins MD on 10/13/2010   Method used:   Print then Give to Patient   RxID:   [id number] GLIPIZIDE 10 MG TABS (GLIPIZIDE) 1 by mouth q am only  #90 x 3   Entered and Authorized by:   Corwin Levins MD   Signed by:  Corwin Levins MD on 10/13/2010   Method used:   Print then Give to Patient   RxID:   [id number] KOMBIGLYZE XR 04-999 MG XR24H-TAB (SAXAGLIPTIN-METFORMIN) 1po once daily  #90 x 3   Entered and Authorized by:   Corwin Levins MD   Signed by:   Corwin Levins MD on 10/13/2010   Method used:   Print then Give to Patient   RxID:   [id number] KOMBIGLYZE XR 04-999 MG XR24H-TAB (SAXAGLIPTIN-METFORMIN) 1po once daily  #30 x 11   Entered and Authorized by:   Corwin Levins MD   Signed by:   Corwin Levins MD on 10/13/2010   Method used:   Print then Give to Patient   RxID:   [id number]    Orders Added: 1)  Est. Patient Level IV [44034]

## 2011-01-05 NOTE — Progress Notes (Signed)
Summary: Stomach issues  Phone Note Call from Patient   Summary of Call: Pt c/o continued problems with his stomach. Please advise.  Initial call taken by: Lamar Sprinkles, CMA,  January 15, 2010 8:56 AM  Follow-up for Phone Call        pt called stating that he has not had much improvement on mag citrate. still has abdominal pain and bloating. pt is requesting additional studies, possible referral as he feels this may cause him to have to take a leave of absence from work Follow-up by: Margaret Pyle, CMA,  January 15, 2010 10:33 AM  Additional Follow-up for Phone Call Additional follow up Details #1::        not sure what else to offer at this time; I will refer GI Additional Follow-up by: Corwin Levins MD,  January 15, 2010 1:08 PM    Additional Follow-up for Phone Call Additional follow up Details #2::    pt aware of referral, he is also requesting a work note extended trough friday return monday Follow-up by: Margaret Pyle, CMA,  January 15, 2010 2:14 PM  Additional Follow-up for Phone Call Additional follow up Details #3:: Details for Additional Follow-up Action Taken: ok for work note - to robin to handle Additional Follow-up by: Corwin Levins MD,  January 15, 2010 5:21 PM   Did work note that patient requested, 2/9- 2/14

## 2011-01-05 NOTE — Progress Notes (Signed)
Summary: out of work extention  Phone Note Call from Patient Call back at [employer] [phone]   Caller: Patient Summary of Call: pt called stating that after OV for low BP he was stopped on LIsinopril and Spironolactone HCTZ. pt states that after stopping his BP was high at 160s/100s, and therefore unable to go to work. Pt is requesting an extention of out of work note to return 03/15. please advise. Initial call taken by: Margaret Pyle, CMA,  February 12, 2010 9:21 AM  Follow-up for Phone Call        elevated BP does not cause symptoms  normallly;  why would he need a note for work?  was there some kind of symptom? Follow-up by: Corwin Levins MD,  February 12, 2010 12:01 PM  Additional Follow-up for Phone Call Additional follow up Details #1::        pt stated that his employer sent him home and requested him to get a letter from his MD to return Monday. Pt stated that he was feeling lightheaded and had HA, which prompted them to have him check it. Additional Follow-up by: Margaret Pyle, CMA,  February 12, 2010 2:38 PM    Additional Follow-up for Phone Call Additional follow up Details #2::    please re-start lisinopril 10 once daily  - robin to handle rx  needs ROV 2 wks  OK for work extension   - to robin to handle Follow-up by: Corwin Levins MD,  February 12, 2010 5:00 PM   Patient seen in office 02/13/2010, refill and work note taken care of at that time.

## 2011-01-05 NOTE — Assessment & Plan Note (Signed)
Summary: 2 WK ROV / NWS  #   Vital Signs:  Patient profile:   55 year old male Height:      74 inches Weight:      389.38 pounds BMI:     50.17 O2 Sat:      94 % on Room air Temp:     98.3 degrees F oral Pulse rate:   67 / minute BP sitting:   116 / 82  (left arm) Cuff size:   large  Vitals Entered ByZella Ball Ewing (Apr 14, 2010 9:16 AM)  O2 Flow:  Room air  CC: 2 week ROV/RE   Primary Care Provider:  Oliver Barre, MD  CC:  2 week ROV/RE.  History of Present Illness: overall doing well, abd pain improved to mild currently but tolerable and manageable;  Pt denies CP, sob, doe, wheezing, orthopnea, pnd, worsening LE edema, palps, dizziness or syncope  Pt denies new neuro symptoms such as headache, facial or extremity weakness   Pt denies polydipsia, polyuria, or low sugar symptoms such as shakiness improved with eating.  Overall good compliance with meds, trying to follow low chol, DM diet, wt stable, little excercise however   Problems Prior to Update: 1)  Preventive Health Care  (ICD-V70.0) 2)  Diab w/o Mention Comp Type Ii/uns Type Uncntrl  (ICD-250.02) 3)  Change in Bowels  (ICD-787.99) 4)  Dehydration  (ICD-276.51) 5)  Hypotension, Orthostatic  (ICD-458.0) 6)  Constipation  (ICD-564.00) 7)  Coumadin Therapy  (ICD-V58.61) 8)  Obesity  (ICD-278.00) 9)  Abdominal Pain-llq  (ICD-789.04) 10)  Abdominal Pain, Upper  (ICD-789.09) 11)  Back Pain  (ICD-724.5) 12)  Degenerative Joint Disease, Knees, Bilateral  (ICD-715.96) 13)  Wheezing  (ICD-786.07) 14)  Smoker  (ICD-305.1) 15)  Wheezing  (ICD-786.07) 16)  Leg Pain, Left  (ICD-729.5) 17)  Obesity, Morbid  (ICD-278.01) 18)  Hypogonadism, Male  (ICD-257.2) 19)  Uti  (ICD-599.0) 20)  Colonic Polyps, Hx of  (ICD-V12.72) 21)  Renal Insufficiency  (ICD-588.9) 22)  Depression  (ICD-311) 23)  Diverticulosis, Colon  (ICD-562.10) 24)  Hypertension  (ICD-401.9) 25)  Hyperlipidemia  (ICD-272.4) 26)  Diabetes Mellitus, Type II   (ICD-250.00)  Medications Prior to Update: 1)  Actos 45 Mg Tabs (Pioglitazone Hcl) .Marland Kitchen.. 1 Tablet By Mouth Once A Day 2)  Aurora Lancet Super Thin 30g  Misc (Lancets) .... Use 1 Stick As Directed Three Times A Day 3)  Simvastatin 80 Mg  Tabs (Simvastatin) .Marland Kitchen.. 1 By Mouth Once Daily 4)  Onetouch Ultra Test  Strp (Glucose Blood) .... Use 1 Strip Three Times A Day 5)  Zetia 10 Mg Tabs (Ezetimibe) .... Take 1 Tablet By Mouth Once A Day 6)  Glipizide 10 Mg Tabs (Glipizide) .Marland Kitchen.. 1 By Mouth Two Times A Day 7)  Lisinopril 5 Mg Tabs (Lisinopril) .Marland Kitchen.. 1 By Mouth Once Daily 8)  Ecotrin Low Strength 81 Mg  Tbec (Aspirin) .Marland Kitchen.. 1 By Mouth Qd 9)  Hydrochlorothiazide 25 Mg Tabs (Hydrochlorothiazide) .Marland Kitchen.. 1po Once Daily 10)  Tramadol Hcl 50 Mg Tabs (Tramadol Hcl) .Marland Kitchen.. 1 - 2 By Mouth Four Times Per Day As Needed For Pain 11)  Aciphex 20 Mg Tbec (Rabeprazole Sodium) .Marland Kitchen.. 1 By Mouth Once Daily 12)  Coumadin 5 Mg Tabs (Warfarin Sodium) .... Take As Directed By Coumadin Clinic. 13)  Proair Hfa 108 (90 Base) Mcg/act Aers (Albuterol Sulfate) .... 2 Puffs Four Times Per Day As Needed For Shortness of Breath 14)  Flexeril 5 Mg Tabs (Cyclobenzaprine Hcl) .Marland KitchenMarland KitchenMarland Kitchen  1 By Mouth Three Times A Day As Needed 15)  Promethazine Hcl 25 Mg Tabs (Promethazine Hcl) .Marland Kitchen.. 1po Q 6 Hrs As Needed Nausea 16)  Miralax  Powd (Polyethylene Glycol 3350) .... Mix 1 Capful (17 Grams) in 8 Oz Water Daily 17)  Phillips Milk of Magnesia 7.75 % Susp (Magnesium Hydroxide) .... Take Capful As Needed 18)  Cymbalta 60 Mg Cpep (Duloxetine Hcl) .Marland Kitchen.. 1 By Mouth Once Daily 19)  Phentermine Hcl 37.5 Mg Caps (Phentermine Hcl) .Marland Kitchen.. 1po Once Daily 20)  Dicyclomine Hcl 20 Mg Tabs (Dicyclomine Hcl) .Marland Kitchen.. 1 By Mouth Every 6 Hrs As Needed For Abdominal Pain  Current Medications (verified): 1)  Actos 45 Mg Tabs (Pioglitazone Hcl) .Marland Kitchen.. 1 Tablet By Mouth Once A Day 2)  Aurora Lancet Super Thin 30g  Misc (Lancets) .... Use 1 Stick As Directed Three Times A Day 3)   Simvastatin 40 Mg Tabs (Simvastatin) .Marland Kitchen.. 1 By Mouth Once Daily 4)  Onetouch Ultra Test  Strp (Glucose Blood) .... Use 1 Strip Three Times A Day 5)  Zetia 10 Mg Tabs (Ezetimibe) .... Take 1 Tablet By Mouth Once A Day 6)  Glipizide 10 Mg Tabs (Glipizide) .Marland Kitchen.. 1 By Mouth Two Times A Day 7)  Lisinopril 5 Mg Tabs (Lisinopril) .Marland Kitchen.. 1 By Mouth Once Daily 8)  Ecotrin Low Strength 81 Mg  Tbec (Aspirin) .Marland Kitchen.. 1 By Mouth Qd 9)  Hydrochlorothiazide 25 Mg Tabs (Hydrochlorothiazide) .Marland Kitchen.. 1po Once Daily 10)  Tramadol Hcl 50 Mg Tabs (Tramadol Hcl) .Marland Kitchen.. 1 - 2 By Mouth Four Times Per Day As Needed For Pain 11)  Aciphex 20 Mg Tbec (Rabeprazole Sodium) .Marland Kitchen.. 1 By Mouth Once Daily 12)  Coumadin 5 Mg Tabs (Warfarin Sodium) .... Take As Directed By Coumadin Clinic. 13)  Proair Hfa 108 (90 Base) Mcg/act Aers (Albuterol Sulfate) .... 2 Puffs Four Times Per Day As Needed For Shortness of Breath 14)  Flexeril 5 Mg Tabs (Cyclobenzaprine Hcl) .Marland Kitchen.. 1 By Mouth Three Times A Day As Needed 15)  Promethazine Hcl 25 Mg Tabs (Promethazine Hcl) .Marland Kitchen.. 1po Q 6 Hrs As Needed Nausea 16)  Miralax  Powd (Polyethylene Glycol 3350) .... Mix 1 Capful (17 Grams) in 8 Oz Water Daily 17)  Phillips Milk of Magnesia 7.75 % Susp (Magnesium Hydroxide) .... Take Capful As Needed 18)  Cymbalta 60 Mg Cpep (Duloxetine Hcl) .Marland Kitchen.. 1 By Mouth Once Daily 19)  Phentermine Hcl 37.5 Mg Caps (Phentermine Hcl) .Marland Kitchen.. 1po Once Daily 20)  Dicyclomine Hcl 20 Mg Tabs (Dicyclomine Hcl) .Marland Kitchen.. 1 By Mouth Every 6 Hrs As Needed For Abdominal Pain  Allergies (verified): No Known Drug Allergies  Past History:  Past Medical History: Last updated: 01/21/2010 Diabetes mellitus, type II Hyperlipidemia Hypertension kidney stones,knee surgery,ankel surgery,bone spurs,torn ligaent Obstructive Sleep Apnea DVT- R Lower Extremity Pulmonary Embolus Diverticulosis, colon Depression Renal insufficiency Colonic polyps, hx of hypogonadism Obesity  Past Surgical  History: Last updated: 03/02/2010 Knee Surgery- 21-Apr-1999 & Apr 20, 2002, cartilage damage Tonsillectomy and Adenoidectomy Ankle Surgery with bone spurs and torn tendon- 04/20/2000 bilateral  COLONOSCOPY 2005-DIVERTICULOSIS/HYPERPLASTIC POLYPS-DUE FOR F/U 04/20/2014 EGD 2008-CHRONIC DUODENITIS  Family History: Last updated: 01/21/2010 father with prostate cancer mother with HTN sister died with DM 04-20-2001 brother and 2 more sisters with DM No FH of Colon Cancer:  Social History: Last updated: 01/21/2010 Former Smoker Alcohol use-no Divorced 2 children work - back to work at [employer] since jan 2010 Daily Caffeine Use one per day  Risk Factors: Smoking Status: quit (10/05/2007)  Review of Systems  The patient denies anorexia, fever, weight loss, weight gain, vision loss, decreased hearing, hoarseness, chest pain, syncope, dyspnea on exertion, peripheral edema, prolonged cough, headaches, hemoptysis, melena, hematochezia, severe indigestion/heartburn, hematuria, muscle weakness, suspicious skin lesions, transient blindness, depression, abnormal bleeding, enlarged lymph nodes, angioedema, and breast masses.         all otherwise negative per pt -    Physical Exam  General:  alert and overweight-appearing.   Head:  normocephalic and atraumatic.   Eyes:  vision grossly intact, pupils equal, and pupils round.   Ears:  R ear normal and L ear normal.   Nose:  no external deformity and no nasal discharge.   Mouth:  no gingival abnormalities and pharynx pink and moist.   Neck:  supple and no masses.   Lungs:  normal respiratory effort and normal breath sounds.   Heart:  normal rate and regular rhythm.   Abdomen:  soft, non-tender, and normal bowel sounds.   Msk:  no joint tenderness and no joint swelling.   Extremities:  no edema, no erythema  Neurologic:  cranial nerves II-XII intact and strength normal in all extremities.     Impression & Recommendations:  Problem # 1:  Preventive Health  Care (ICD-V70.0)  Overall doing well, age appropriate education and counseling updated and referral for appropriate preventive services done unless declined, immunizations up to date or declined, diet counseling done if overweight, urged to quit smoking if smokes , most recent labs reviewed and current ordered if appropriate, ecg reviewed or declined (interpretation per ECG scanned in the EMR if done); information regarding Medicare Prevention requirements given if appropriate; speciality referrals updated as appropriate   Orders: TLB-BMP (Basic Metabolic Panel-BMET) (80048-METABOL) TLB-CBC Platelet - w/Differential (85025-CBCD) TLB-Hepatic/Liver Function Pnl (80076-HEPATIC) TLB-Lipid Panel (80061-LIPID) TLB-TSH (Thyroid Stimulating Hormone) (84443-TSH) TLB-PSA (Prostate Specific Antigen) (84153-PSA) TLB-Udip ONLY (81003-UDIP)  Problem # 2:  CONSTIPATION (ICD-564.00)  His updated medication list for this problem includes:    Miralax Powd (Polyethylene glycol 3350) ..... Mix 1 capful (17 grams) in 8 oz water daily    Phillips Milk of Magnesia 7.75 % Susp (Magnesium hydroxide) .Marland Kitchen... Take capful as needed improved per pt ; will also decrease the statin by half to see if improves further  Complete Medication List: 1)  Actos 45 Mg Tabs (Pioglitazone hcl) .Marland Kitchen.. 1 tablet by mouth once a day 2)  Aurora Lancet Super Thin 30g Misc (Lancets) .... Use 1 stick as directed three times a day 3)  Simvastatin 40 Mg Tabs (Simvastatin) .Marland Kitchen.. 1 by mouth once daily 4)  Onetouch Ultra Test Strp (Glucose blood) .... Use 1 strip three times a day 5)  Zetia 10 Mg Tabs (Ezetimibe) .... Take 1 tablet by mouth once a day 6)  Glipizide 10 Mg Tabs (Glipizide) .Marland Kitchen.. 1 by mouth two times a day 7)  Lisinopril 5 Mg Tabs (Lisinopril) .Marland Kitchen.. 1 by mouth once daily 8)  Ecotrin Low Strength 81 Mg Tbec (Aspirin) .Marland Kitchen.. 1 by mouth qd 9)  Hydrochlorothiazide 25 Mg Tabs (Hydrochlorothiazide) .Marland Kitchen.. 1po once daily 10)  Tramadol Hcl 50 Mg  Tabs (Tramadol hcl) .Marland Kitchen.. 1 - 2 by mouth four times per day as needed for pain 11)  Aciphex 20 Mg Tbec (Rabeprazole sodium) .Marland Kitchen.. 1 by mouth once daily 12)  Coumadin 5 Mg Tabs (Warfarin sodium) .... Take as directed by coumadin clinic. 13)  Proair Hfa 108 (90 Base) Mcg/act Aers (Albuterol sulfate) .... 2 puffs four times per day as needed  for shortness of breath 14)  Flexeril 5 Mg Tabs (Cyclobenzaprine hcl) .Marland Kitchen.. 1 by mouth three times a day as needed 15)  Promethazine Hcl 25 Mg Tabs (Promethazine hcl) .Marland Kitchen.. 1po q 6 hrs as needed nausea 16)  Miralax Powd (Polyethylene glycol 3350) .... Mix 1 capful (17 grams) in 8 oz water daily 17)  Phillips Milk of Magnesia 7.75 % Susp (Magnesium hydroxide) .... Take capful as needed 18)  Cymbalta 60 Mg Cpep (Duloxetine hcl) .Marland Kitchen.. 1 by mouth once daily 19)  Phentermine Hcl 37.5 Mg Caps (Phentermine hcl) .Marland Kitchen.. 1po once daily 20)  Dicyclomine Hcl 20 Mg Tabs (Dicyclomine hcl) .Marland Kitchen.. 1 by mouth every 6 hrs as needed for abdominal pain  Other Orders: TLB-Microalbumin/Creat Ratio, Urine (82043-MALB) TLB-A1C / Hgb A1C (Glycohemoglobin) (83036-A1C)  Patient Instructions: 1)  decrease the simvastatin to 40 mg per day 2)  Continue all previous medications as before this visit  3)  Please go to the Lab in the basement for your blood and/or urine tests today  4)  Please schedule a follow-up appointment in 6 months with: 5)  BMP prior to visit, ICD-9: 250.02 6)  Lipid Panel prior to visit, ICD-9: 7)  HbgA1C prior to visit, ICD-9: Prescriptions: SIMVASTATIN 40 MG TABS (SIMVASTATIN) 1 by mouth once daily  #90 x 3   Entered and Authorized by:   Corwin Levins MD   Signed by:   Corwin Levins MD on 04/14/2010   Method used:   Print then Give to Patient   RxID:   [id number]

## 2011-01-05 NOTE — Medication Information (Signed)
Summary: rov/ewj  Anticoagulant Therapy  Managed by: Weston Brass, PharmD Referring MD: Nona Dell PCP: Oliver Barre, MD Supervising MD: Riley Kill MD, Maisie Fus Indication 1: Pulmonary Embolism and Infarction (ICD-415.1) Lab Used: [facility] Site: [facility] INR POC 1.7 INR RANGE 2 - 3  Dietary changes: yes       Details: Eaten more than normal the past week  Health status changes: no    Bleeding/hemorrhagic complications: no    Recent/future hospitalizations: no    Any changes in medication regimen? no    Recent/future dental: no  Any missed doses?: no       Is patient compliant with meds? yes       Allergies: No Known Drug Allergies  Anticoagulation Management History:      The patient is taking warfarin and comes in today for a routine follow up visit.  Positive risk factors for bleeding include presence of serious comorbidities.  Negative risk factors for bleeding include an age less than 7 years old.  The bleeding index is 'intermediate risk'.  Positive CHADS2 values include History of HTN and History of Diabetes.  Negative CHADS2 values include Age > 38 years old.  The start date was 08/05/2005.  Anticoagulation responsible provider: Riley Kill MD, Maisie Fus.  INR POC: 1.7.  Exp: 09/2011.    Anticoagulation Management Assessment/Plan:      The patient's current anticoagulation dose is Coumadin 5 mg tabs: Take as directed by coumadin clinic..  The target INR is 2 - 3.  The next INR is due 08/14/2010.  Anticoagulation instructions were given to patient.  Results were reviewed/authorized by Weston Brass, PharmD.  He was notified by Gweneth Fritter, PharmD Candidate.         Prior Anticoagulation Instructions: INR 1.8  Take 2.5 tablets today, then start taking 2 tablets daily.  Recheck in 3 weeks.    Current Anticoagulation Instructions: INR 1.7  Take another tablet (5mg ) today, August 19th.  Then resume Coumadin schedule of taking 2 tablets (10mg ) every day except take 2.5  tablets (12.5mg ) on Tuesdays.  Recheck in 3 weeks.

## 2011-01-05 NOTE — Medication Information (Signed)
Summary: rov/sp  Anticoagulant Therapy  Managed by: Weston Brass, PharmD Referring MD: Nona Dell PCP: Oliver Barre, MD Supervising MD: Daleen Squibb MD, Maisie Fus Indication 1: Pulmonary Embolism and Infarction (ICD-415.1) Lab Used: [facility] Site: [facility] INR POC 2.6 INR RANGE 2 - 3  Dietary changes: no    Health status changes: no    Bleeding/hemorrhagic complications: no    Recent/future hospitalizations: no    Any changes in medication regimen? no    Recent/future dental: no  Any missed doses?: no       Is patient compliant with meds? yes       Allergies: No Known Drug Allergies  Anticoagulation Management History:      The patient is taking warfarin and comes in today for a routine follow up visit.  Positive risk factors for bleeding include presence of serious comorbidities.  Negative risk factors for bleeding include an age less than 77 years old.  The bleeding index is 'intermediate risk'.  Positive CHADS2 values include History of HTN and History of Diabetes.  Negative CHADS2 values include Age > 74 years old.  The start date was 08/05/2005.  Anticoagulation responsible provider: Daleen Squibb MD, Maisie Fus.  INR POC: 2.6.  Cuvette Lot#: [id number].  Exp: 07/2011.    Anticoagulation Management Assessment/Plan:      The patient's current anticoagulation dose is Coumadin 5 mg tabs: Take as directed by coumadin clinic..  The target INR is 2 - 3.  The next INR is due 05/15/2010.  Anticoagulation instructions were given to patient.  Results were reviewed/authorized by Weston Brass, PharmD.  He was notified by [facility].         Prior Anticoagulation Instructions: INR-4.8 Hold dose today and Tomorrow. Restart on Wednesday. Start new dosing schedule. Take 2 tablets daily. Return  in 10 days.    Current Anticoagulation Instructions: INR-2.6 Resume normal dosing schedule. Take 2 tablets daily.  Return in 2 weeks.

## 2011-01-05 NOTE — Progress Notes (Signed)
Summary: Biopsy Results   Phone Note Call from Patient Call back at Home Phone [phone]   Call For: Dr Leone Payor Reason for Call: Lab or Test Results Initial call taken by: Leanor Kail [facility],  Apr 09, 2010 8:23 AM  Follow-up for Phone Call        patient notified that clo bx was negative Darcey Nora RN, [facility]  Apr 09, 2010 9:25 AM

## 2011-01-05 NOTE — Medication Information (Signed)
Summary: ccr/jss  Anticoagulant Therapy  Managed by: Cloyde Reams, RN, BSN Referring MD: Nona Dell PCP: Oliver Barre, MD Supervising MD: Excell Seltzer MD, Casimiro Needle Indication 1: Pulmonary Embolism and Infarction (ICD-415.1) Lab Used: [facility]  Site: [facility] INR POC 1.7 INR RANGE 2 - 3  Dietary changes: no    Health status changes: no    Bleeding/hemorrhagic complications: no    Recent/future hospitalizations: yes       Details: Pt had endo and colon on 03/18/10 off coumadin.    Any changes in medication regimen? no    Recent/future dental: no  Any missed doses?: no       Is patient compliant with meds? yes      Comments: Completed Lovenox yesterday.    Allergies (verified): No Known Drug Allergies  Anticoagulation Management History:      The patient is taking warfarin and comes in today for a routine follow up visit.  Positive risk factors for bleeding include presence of serious comorbidities.  Negative risk factors for bleeding include an age less than 61 years old.  The bleeding index is 'intermediate risk'.  Positive CHADS2 values include History of HTN and History of Diabetes.  Negative CHADS2 values include Age > 63 years old.  The start date was 08/05/2005.  Anticoagulation responsible provider: Excell Seltzer MD, Casimiro Needle.  INR POC: 1.7.  Cuvette Lot#: [id number].  Exp: 03/2011.    Anticoagulation Management Assessment/Plan:      The patient's current anticoagulation dose is Coumadin 5 mg tabs: Take as directed by coumadin clinic..  The target INR is 2 - 3.  The next INR is due 03/31/2010.  Anticoagulation instructions were given to patient.  Results were reviewed/authorized by Cloyde Reams, RN, BSN.  He was notified by Cloyde Reams RN.         Prior Anticoagulation Instructions: INR = 3.0 The patient is to continue with the same dose of coumadin.  This dosage includes:  2 tablets on Sun, Tue, Thur, Sat 2 1/2 tablets on Mon, Wed, Fri  Current Anticoagulation  Instructions: INR 1.7  Continue Lovenox 1 injection this am and 1 injection this pm.  Take 3 tablets of coumadin today, then resume same dosage 2 tablets daily except 2.5 tablets on Mondays, Wednesdays, and Fridays.  Recheck in 1 week.

## 2011-01-05 NOTE — Letter (Signed)
Summary: Diabetic Instructions  [facility]  [address] [place], [place] [address]   Phone: [phone]  Fax: [phone]    CYLE KENYON [birth year] MRN: [id number]   _ X _   ORAL DIABETIC MEDICATION INSTRUCTIONS  The day before your procedure:   Take your diabetic pill as you do normally  The day of your procedure:   Do not take your diabetic pill    We will check your blood sugar levels during the admission process and again in Recovery before discharging you home  ________________________________________________________________________

## 2011-01-05 NOTE — Letter (Signed)
Summary: Out of Work  [facility]  [address] [place], [place] [address]   Phone: [phone]  Fax: [phone]    January 21, 2010   Employee:  [name]    To Whom It May Concern:   For Medical reasons, please excuse the above named employee from work for the following dates:  Start:   01/19/10  End:   01/23/10  If you need additional information, please feel free to contact our office.         Sincerely,    Francee Piccolo CMA (AAMA)

## 2011-01-05 NOTE — Miscellaneous (Signed)
Summary: [facility] Previsit/prep  Clinical Lists Changes  Medications: Added new medication of MOVIPREP 100 GM  SOLR (PEG-KCL-NACL-NASULF-NA ASC-C) As per prep instructions. - Signed Rx of MOVIPREP 100 GM  SOLR (PEG-KCL-NACL-NASULF-NA ASC-C) As per prep instructions.;  #1 x 0;  Signed;  Entered by: [name];  Authorized by: Iva Boop MD, FACG;  Method used: Electronically to [facility]  [address] [id number] [address], [place], [place]  [address], Ph: [phone] or [phone], Fax: [phone] Observations: Added new observation of NKA: T (03/12/2010 13:29)    Prescriptions: MOVIPREP 100 GM  SOLR (PEG-KCL-NACL-NASULF-NA ASC-C) As per prep instructions.  #1 x 0   Entered by:   [name]   Authorized by:   Iva Boop MD, [facility]   Signed by:   [name] on 03/12/2010   Method used:   [facility] to        [facility]  [address] [id number]       [address]       [place], [place]  [address]       Ph: [phone] or [phone]       Fax: [phone]   RxID:   [id number]

## 2011-01-05 NOTE — Letter (Signed)
Summary: [facility]   Imported By: Sherian Rein 06/23/2010 10:02:58  _____________________________________________________________________  External Attachment:    Type:   Image     Comment:   External Document

## 2011-01-05 NOTE — Procedures (Signed)
Summary: Instruction for procedure/[facility] (Out pt)  Instruction for procedure/[facility] (Out pt)   Imported By: Sherian Rein 03/16/2010 12:12:44  _____________________________________________________________________  External Attachment:    Type:   Image     Comment:   External Document

## 2011-01-05 NOTE — Letter (Signed)
Summary: Out of Work  [facility]  [address] [place], [place] [address]   Phone: [phone]  Fax: [phone]    December 24, 2009   Employee:  [name]    To Whom It May Concern:   For Medical reasons, please excuse the above named employee from work for the following dates:  Start:   December 24, 2009  End:   December 29, 2009  If you need additional information, please feel free to contact our office.         Sincerely,    Corwin Levins MD

## 2011-01-05 NOTE — Letter (Signed)
Summary: Anticoagulation Modification Letter  [facility]  [address] [place], [place] [address]   Phone: [phone]  Fax: [phone]    March 02, 2010  Re:    JANELLE CULTON DOB:    1956-05-25 MRN:  [id number]    Dear Dr. Jonny Ruiz:  We would like to schedule the above patient for an endoscopic procedure. Our records show that  he is on anticoagulation therapy. Please advise as to how long the patient may come off their therapy of Coumadin prior to any scheduled procedures.  Per Dr. Leone Payor, it would be OK to hold Coumadin for as little as 3 days.  Please route the appended form to Francee Piccolo, CMA.  Thank you for your help with this matter.  Sincerely,  Francee Piccolo CMA Duncan Dull)   Physician Recommendation:  Hold Plavix 7 days prior ________________  Hold Coumadin 5 days prior ____________  Other ______________________________     Appended Document: Anticoagulation Modification Letter Pt with hx of DVT and large PE bilat on lifelong coumadin:  anticoag to change as follows:       1)   stop coumadin 5 days prior to procedure     2)    enoxaparin 150 mg Subcutaneously two times a day for the 5 days prior to the procedure, with last dosing the morning of the day prior to the procedure ( I will do rx)      3)   pt to re-start lovenox the evening after the procedure, or at GI discretion, for total 6 doses after the procedure      5)   pt to re-start coumadin the day after the procedure, usual dosing      6)   pt to see coumadin clinic 3 days (or next day able) after the procedure to check INR  robin - to call pt to inform, and arrange any above (to notify the coumadin clinic when needs seen post procedure); I will do rx for lovenox  Appended Document: Anticoagulation Modification Letter called pt left msg to call back  Appended Document: Anticoagulation Modification Letter called pt informed of all above informatin. Asked pt to write it all down  and state it back to me to confirm he understood all.  Appended Document: Anticoagulation Modification Letter please send in the mail as well so there is no confusion, and forward this to GI  Appended Document: Anticoagulation Modification Letter sent pt copy of instruction and faxed note to Dr. Marvell Fuller office

## 2011-01-05 NOTE — Assessment & Plan Note (Signed)
Summary: LOWER BACK PAIN/NWS   Vital Signs:  Patient profile:   55 year old male Height:      74 inches (187.96 cm) Weight:      391.13 pounds (177.79 kg) O2 Sat:      91 % on Room air Temp:     97.2 degrees F (36.22 degrees C) oral Pulse rate:   67 / minute BP sitting:   114 / 80  (left arm) Cuff size:   large  Vitals Entered By: Josph Macho CMA (December 24, 2009 2:25 PM)  O2 Flow:  Room air CC: Lower back pain X1day/ pt wants flu vax/ CF Is Patient Diabetic? Yes   CC:  Lower back pain X1day/ pt wants flu vax/ CF.  History of Present Illness: here with 2 dyas onset back pain, started mild  but gradually worse and severe over the past day, only position better is to sit as lying down adnd standing up and walking makes pain worse, could not go to work today;  located more midline adn right; no fever , falls, radicualr pain or LE weakness or numbness;  does also have bilat knee pain that "goes down to the feet";  no obvious inciting even and no changes in bowel or bladder,   Pt denies CP, sob, doe, wheezing, orthopnea, pnd, worsening LE edema, palps, dizziness or syncope.  /Pt denies new neuro symptoms such as headache, facial or extremity weakness  has had back pain off and on for years but more severe and no obvious cause this time.   No unsuall acitivity the day before onset pain.  no injury.    Problems Prior to Update: 1)  Degenerative Joint Disease, Knees, Bilateral  (ICD-715.96) 2)  Wheezing  (ICD-786.07) 3)  Smoker  (ICD-305.1) 4)  Wheezing  (ICD-786.07) 5)  Preventive Health Care  (ICD-V70.0) 6)  Abdominal Pain, Generalized  (ICD-789.07) 7)  Preoperative Examination  (ICD-V72.84) 8)  Leg Pain, Left  (ICD-729.5) 9)  Obesity, Morbid  (ICD-278.01) 10)  Preventive Health Care  (ICD-V70.0) 11)  Hypogonadism, Male  (ICD-257.2) 12)  Uti  (ICD-599.0) 13)  Colonic Polyps, Hx of  (ICD-V12.72) 14)  Renal Insufficiency  (ICD-588.9) 15)  Depression  (ICD-311) 16)   Diverticulosis, Colon  (ICD-562.10) 17)  Hypertension  (ICD-401.9) 18)  Hyperlipidemia  (ICD-272.4) 19)  Diabetes Mellitus, Type II  (ICD-250.00)  Medications Prior to Update: 1)  Actos 45 Mg Tabs (Pioglitazone Hcl) .Marland Kitchen.. 1 Tablet By Mouth Once A Day 2)  Aurora Lancet Super Thin 30g  Misc (Lancets) .... Use 1 Stick As Directed Three Times A Day 3)  Simvastatin 80 Mg  Tabs (Simvastatin) .Marland Kitchen.. 1 By Mouth Once Daily 4)  Onetouch Ultra Test  Strp (Glucose Blood) .... Use 1 Strip Three Times A Day 5)  Zetia 10 Mg Tabs (Ezetimibe) .... Take 1 Tablet By Mouth Once A Day 6)  Diabeta 5 Mg  Tabs (Glyburide) .Marland Kitchen.. 1 By Mouth Once Daily 7)  Lisinopril 10 Mg  Tabs (Lisinopril) .Marland Kitchen.. 1 By Mouth Once Daily 8)  Ecotrin Low Strength 81 Mg  Tbec (Aspirin) .Marland Kitchen.. 1 By Mouth Qd 9)  Spironolactone-Hctz 25-25 Mg  Tabs (Spironolactone-Hctz) .Marland Kitchen.. 1 By Mouth Once Daily 10)  Tramadol Hcl 50 Mg Tabs (Tramadol Hcl) .Marland Kitchen.. 1 - 2 By Mouth Four Times Per Day As Needed For Pain 11)  Aciphex 20 Mg Tbec (Rabeprazole Sodium) .Marland Kitchen.. 1 By Mouth Once Daily 12)  Coumadin 5 Mg Tabs (Warfarin Sodium) .... Take As Directed By  Coumadin Clinic. 13)  Proair Hfa 108 (90 Base) Mcg/act Aers (Albuterol Sulfate) .... 2 Puffs Four Times Per Day As Needed For Shortness of Breath 14)  Azithromycin 250 Mg Tabs (Azithromycin) .... 2po Qd For 1 Day, Then 1po Qd For 4days, Then Stop 15)  Hydrocodone-Homatropine 5-1.5 Mg/24ml Syrp (Hydrocodone-Homatropine) .Marland Kitchen.. 1 Tsp By Mouth Q 6 Hrs As Needed Cough 16)  Prednisone 10 Mg Tabs (Prednisone) .... 3po Qd For 3days, Then 2po Qd For 3days, Then 1po Qd For 3days, Then Stop  Current Medications (verified): 1)  Actos 45 Mg Tabs (Pioglitazone Hcl) .Marland Kitchen.. 1 Tablet By Mouth Once A Day 2)  Aurora Lancet Super Thin 30g  Misc (Lancets) .... Use 1 Stick As Directed Three Times A Day 3)  Simvastatin 80 Mg  Tabs (Simvastatin) .Marland Kitchen.. 1 By Mouth Once Daily 4)  Onetouch Ultra Test  Strp (Glucose Blood) .... Use 1 Strip Three Times  A Day 5)  Zetia 10 Mg Tabs (Ezetimibe) .... Take 1 Tablet By Mouth Once A Day 6)  Diabeta 5 Mg  Tabs (Glyburide) .Marland Kitchen.. 1 By Mouth Once Daily 7)  Lisinopril 10 Mg  Tabs (Lisinopril) .Marland Kitchen.. 1 By Mouth Once Daily 8)  Ecotrin Low Strength 81 Mg  Tbec (Aspirin) .Marland Kitchen.. 1 By Mouth Qd 9)  Spironolactone-Hctz 25-25 Mg  Tabs (Spironolactone-Hctz) .Marland Kitchen.. 1 By Mouth Once Daily 10)  Tramadol Hcl 50 Mg Tabs (Tramadol Hcl) .Marland Kitchen.. 1 - 2 By Mouth Four Times Per Day As Needed For Pain 11)  Aciphex 20 Mg Tbec (Rabeprazole Sodium) .Marland Kitchen.. 1 By Mouth Once Daily 12)  Coumadin 5 Mg Tabs (Warfarin Sodium) .... Take As Directed By Coumadin Clinic. 13)  Proair Hfa 108 (90 Base) Mcg/act Aers (Albuterol Sulfate) .... 2 Puffs Four Times Per Day As Needed For Shortness of Breath 14)  Azithromycin 250 Mg Tabs (Azithromycin) .... 2po Qd For 1 Day, Then 1po Qd For 4days, Then Stop 15)  Hydrocodone-Homatropine 5-1.5 Mg/13ml Syrp (Hydrocodone-Homatropine) .Marland Kitchen.. 1 Tsp By Mouth Q 6 Hrs As Needed Cough 16)  Prednisone 10 Mg Tabs (Prednisone) .... 3po Qd For 3days, Then 2po Qd For 3days, Then 1po Qd For 3days, Then Stop  Allergies (verified): No Known Drug Allergies  Past History:  Past Medical History: Last updated: 03/04/2008 Diabetes mellitus, type II Hyperlipidemia Hypertension kidney stones,knee surgery,ankel surgery,bone spurs,torn ligaent Obstructive Sleep Apnea DVT- R Lower Extremity Pulmonary Embolus Diverticulosis, colon Depression Renal insufficiency Colonic polyps, hx of hypogonadism  Past Surgical History: Last updated: 06/30/2007 Knee Surgery- 2000 & 2003, cartilage damage Tonsillectomy and Adenoidectomy Ankle Surgery with bone spurs and torn tendon- 2001  Social History: Last updated: 03/11/2009 Former Smoker Alcohol use-no Divorced 2 children work - back to work at [employer] since jan 2010  Risk Factors: Smoking Status: quit (10/05/2007)  Review of Systems       all otherwise negative per  pt -  Physical Exam  General:  alert and overweight-appearing.   Head:  normocephalic and atraumatic.   Eyes:  vision grossly intact, pupils equal, and pupils round.   Ears:  R ear normal and L ear normal.   Nose:  no external deformity and no nasal discharge.   Mouth:  no gingival abnormalities and pharynx pink and moist.   Neck:  supple and no masses.   Lungs:  normal respiratory effort and normal breath sounds.   Heart:  normal rate and regular rhythm.   Abdomen:  soft, non-tender, and normal bowel sounds.   Msk:  moderate to  severe tender right lumbar paravertebral wtih spasm Extremities:  no edema, no erythema  Neurologic:  cranial nerves II-XII intact, strength normal in all extremities, sensation intact to light touch, and DTRs symmetrical and normal.     Impression & Recommendations:  Problem # 1:  BACK PAIN (ICD-724.5)  His updated medication list for this problem includes:    Ecotrin Low Strength 81 Mg Tbec (Aspirin) .Marland Kitchen... 1 by mouth qd    Tramadol Hcl 50 Mg Tabs (Tramadol hcl) .Marland Kitchen... 1 - 2 by mouth four times per day as needed for pain    Oxycodone Hcl 5 Mg Caps (Oxycodone hcl) .Marland Kitchen... 1 - 3 by mouth q 6 hrs as needed pain    Flexeril 5 Mg Tabs (Cyclobenzaprine hcl) .Marland Kitchen... 1 by mouth three times a day as needed c/w msk strain by exam but pain severe - will treat as above, f/u any worsening signs or symptoms , as well as prednisone burst and taper off; does appear to need films to r/o fx of MRI at this time;  gave note for work  Problem # 2:  HYPERTENSION (ICD-401.9)  His updated medication list for this problem includes:    Lisinopril 10 Mg Tabs (Lisinopril) .Marland Kitchen... 1 by mouth once daily    Spironolactone-hctz 25-25 Mg Tabs (Spironolactone-hctz) .Marland Kitchen... 1 by mouth once daily  BP today: 114/80 Prior BP: 114/80 (11/17/2009)  Labs Reviewed: K+: 3.8 (09/11/2009) Creat: : 1.0 (09/11/2009)   Chol: 132 (09/11/2009)   HDL: 40.00 (09/11/2009)   LDL: 79 (09/11/2009)   TG: 66.0  (09/11/2009) stable overall by hx and exam, ok to continue meds/tx as is   Problem # 3:  DIABETES MELLITUS, TYPE II (ICD-250.00)  His updated medication list for this problem includes:    Actos 45 Mg Tabs (Pioglitazone hcl) .Marland Kitchen... 1 tablet by mouth once a day    Diabeta 5 Mg Tabs (Glyburide) .Marland Kitchen... 1 by mouth once daily    Lisinopril 10 Mg Tabs (Lisinopril) .Marland Kitchen... 1 by mouth once daily    Ecotrin Low Strength 81 Mg Tbec (Aspirin) .Marland Kitchen... 1 by mouth qd  Labs Reviewed: Creat: 1.0 (09/11/2009)    Reviewed HgBA1c results: 6.7 (09/11/2009)  6.5 (08/30/2008) stable overall by hx and exam, ok to continue meds/tx as is   Complete Medication List: 1)  Actos 45 Mg Tabs (Pioglitazone hcl) .Marland Kitchen.. 1 tablet by mouth once a day 2)  Aurora Lancet Super Thin 30g Misc (Lancets) .... Use 1 stick as directed three times a day 3)  Simvastatin 80 Mg Tabs (Simvastatin) .Marland Kitchen.. 1 by mouth once daily 4)  Onetouch Ultra Test Strp (Glucose blood) .... Use 1 strip three times a day 5)  Zetia 10 Mg Tabs (Ezetimibe) .... Take 1 tablet by mouth once a day 6)  Diabeta 5 Mg Tabs (Glyburide) .Marland Kitchen.. 1 by mouth once daily 7)  Lisinopril 10 Mg Tabs (Lisinopril) .Marland Kitchen.. 1 by mouth once daily 8)  Ecotrin Low Strength 81 Mg Tbec (Aspirin) .Marland Kitchen.. 1 by mouth qd 9)  Spironolactone-hctz 25-25 Mg Tabs (Spironolactone-hctz) .Marland Kitchen.. 1 by mouth once daily 10)  Tramadol Hcl 50 Mg Tabs (Tramadol hcl) .Marland Kitchen.. 1 - 2 by mouth four times per day as needed for pain 11)  Aciphex 20 Mg Tbec (Rabeprazole sodium) .Marland Kitchen.. 1 by mouth once daily 12)  Coumadin 5 Mg Tabs (Warfarin sodium) .... Take as directed by coumadin clinic. 13)  Proair Hfa 108 (90 Base) Mcg/act Aers (Albuterol sulfate) .... 2 puffs four times per day as needed for  shortness of breath 14)  Prednisone 10 Mg Tabs (Prednisone) .... 3po qd for 3days, then 2po qd for 3days, then 1po qd for 3days, then stop 15)  Oxycodone Hcl 5 Mg Caps (Oxycodone hcl) .Marland Kitchen.. 1 - 3 by mouth q 6 hrs as needed pain 16)   Flexeril 5 Mg Tabs (Cyclobenzaprine hcl) .Marland Kitchen.. 1 by mouth three times a day as needed  Other Orders: Admin 1st Vaccine (04540) Flu Vaccine 45yrs + (98119) Flu Vaccine Consent Questions     Do you have a history of severe allergic reactions to this vaccine? no    Any prior history of allergic reactions to egg and/or gelatin? no    Do you have a sensitivity to the preservative Thimersol? no    Do you have a past history of Guillan-Barre Syndrome? no    Do you currently have an acute febrile illness? no    Have you ever had a severe reaction to latex? no    Vaccine information given and explained to patient? yes    Are you currently pregnant? no    Lot Number:[id number]   Exp Date:06/04/2010   Site Given  Left Deltoid IM  Patient Instructions: 1)  Please take all new medications as prescribed 2)  Continue all previous medications as before this visit  3)  remember the blood sugar will be mildly increased on the prednisone but will come back to baseline with the prednisone is done 4)  Please schedule a follow-up appointment as needed if persists or worse, or as recommended at your last office visit Prescriptions: FLEXERIL 5 MG TABS (CYCLOBENZAPRINE HCL) 1 by mouth three times a day as needed  #60 x 1   Entered and Authorized by:   Corwin Levins MD   Signed by:   Corwin Levins MD on 12/24/2009   Method used:   Print then Give to Patient   RxID:   [id number] PREDNISONE 10 MG TABS (PREDNISONE) 3po qd for 3days, then 2po qd for 3days, then 1po qd for 3days, then stop  #18 x 0   Entered and Authorized by:   Corwin Levins MD   Signed by:   Corwin Levins MD on 12/24/2009   Method used:   Print then Give to Patient   RxID:   [id number] OXYCODONE HCL 5 MG CAPS (OXYCODONE HCL) 1 - 3 by mouth q 6 hrs as needed pain  #60 x 0   Entered and Authorized by:   Corwin Levins MD   Signed by:   Corwin Levins MD on 12/24/2009   Method used:   Print then Give to Patient   RxID:    [id number]   .lbflu

## 2011-01-05 NOTE — Medication Information (Signed)
Summary: ccr  Anticoagulant Therapy  Managed by: Cloyde Reams, RN, BSN Referring MD: Nona Dell PCP: Oliver Barre, MD Supervising MD: Eden Emms MD, Theron Arista Indication 1: Pulmonary Embolism and Infarction (ICD-415.1) Lab Used: [facility]  Site: [facility] INR POC 1.8 INR RANGE 2 - 3  Dietary changes: no    Health status changes: no    Bleeding/hemorrhagic complications: no    Recent/future hospitalizations: no    Any changes in medication regimen? no    Recent/future dental: no  Any missed doses?: no       Is patient compliant with meds? yes       Allergies: No Known Drug Allergies  Anticoagulation Management History:      The patient is taking warfarin and comes in today for a routine follow up visit.  Positive risk factors for bleeding include presence of serious comorbidities.  Negative risk factors for bleeding include an age less than 51 years old.  The bleeding index is 'intermediate risk'.  Positive CHADS2 values include History of HTN and History of Diabetes.  Negative CHADS2 values include Age > 68 years old.  The start date was 08/05/2005.  Anticoagulation responsible provider: Eden Emms MD, Theron Arista.  INR POC: 1.8.  Cuvette Lot#: [id number].  Exp: 09/2011.    Anticoagulation Management Assessment/Plan:      The patient's current anticoagulation dose is Coumadin 5 mg tabs: Take as directed by coumadin clinic..  The target INR is 2 - 3.  The next INR is due 07/27/2010.  Anticoagulation instructions were given to patient.  Results were reviewed/authorized by Cloyde Reams, RN, BSN.  He was notified by Cloyde Reams RN.         Prior Anticoagulation Instructions: INR 2.5  Continue on same dosage 2 tablets daily except 1.5 tablets on Sundays.   Recheck in 3 weeks.    Current Anticoagulation Instructions: INR 1.8  Take 2.5 tablets today, then start taking 2 tablets daily.  Recheck in 3 weeks.

## 2011-01-05 NOTE — Procedures (Signed)
Summary: Upper Endoscopy  Patient: [name] Note: All result statuses are Final unless otherwise noted.  Tests: (1) Upper Endoscopy (EGD)   EGD Upper Endoscopy       DONE     [facility]     [address] [place], [place]  [address]           ENDOSCOPY PROCEDURE REPORT           PATIENT:  [name], [name]  MR#:  [id number]     BIRTHDATE:  29-Jan-1956, 54 yrs. old  GENDER:  male           ENDOSCOPIST:  Iva Boop, MD, [facility]           PROCEDURE DATE:  03/18/2010     PROCEDURE:  EGD with biopsy for H. pylori     ASA CLASS:  Class III     INDICATIONS:  abdominal pain upper           MEDICATIONS:   Fentanyl 70 mcg IV, Versed 8 mg IV     TOPICAL ANESTHETIC:  Cetacaine Spray           DESCRIPTION OF PROCEDURE:   After the risks benefits and     alternatives of the procedure were thoroughly explained, informed     consent was obtained.  The EG-[id number] ([id number]) endoscope was     introduced through the mouth and advanced to the second portion of     the duodenum, without limitations.  The instrument was slowly     withdrawn as the mucosa was fully examined.     <<PROCEDUREIMAGES>>           Multiple erosions were found in the body of the stomach. Several     small and superficial erosions seen. A biopsy  for H. pylori was     taken.  Multiple erosions were found in the bulb of the duodenum.     Numerous small and superficial erosions seen.  Otherwise the     examination was normal. There was a J-shaped configuration to the     stomach and intubation of the duodenum required near-total scope     insertion.    Retroflexed views revealed findings as previously     described.    The scope was then withdrawn from the patient and     the procedure completed.           COMPLICATIONS:  None           ENDOSCOPIC IMPRESSION:     1) Erosions, multiple in the body of the stomach     2) Erosions, multiple in the bulb of duodenum     3) Otherwise normal examination  RECOMMENDATIONS:     1) Await H. pylori biopsy results     Do not think his pain is related to these erosions as it is much     better now that he is not bending repeatedly (is out of work).     He needs to lose weight =- significant amounts - and should     pursue and investigate bariatric surgical options as I have     previously recommended.     If H. pylori test is + (could be false negative as on PPI) will     treat that.           REPEAT EXAM:  as needed  Iva Boop, MD, [facility]           CC:  The Patient     Corwin Levins, MD           n.     Rosalie Doctor:   Iva Boop at 03/18/2010 02:08 PM           [place], [phone]  Note: An exclamation mark (!) indicates a result that was not dispersed into the flowsheet. Document Creation Date: 03/18/2010 2:10 PM _______________________________________________________________________  (1) Order result status: Final Collection or observation date-time: 03/18/2010 13:29 Requested date-time:  Receipt date-time:  Reported date-time:  Referring Physician:   Ordering Physician: Stan Head [phone]) Specimen Source:  Source: Launa Grill Order Number: [id number] Lab site:   Appended Document: Upper Endoscopy L-Clotest (H. pylori), Biopsy - STATUS: Final                                            Perform Date: 13Apr11 13:30  Ordered By: Denice Paradise  ,          Ordered Date: 13Apr11 15:08                                       Last Updated Date: 14Apr11 15:04  Facility: [facility]                              Department: [facility]  Accession #: [id number]                  USN:       [id number]  Findings  Result Name                              Result     Abnl   Normal Range     Units      Perf. Loc.  Helicobacter Screen                      SEE NOTE.         URENEG    UREASE NEGATIVE    CLOTEST DETECTS THE UREASE    ENZYME OF HELICOBACTER    PYLORI IN GASTRIC MUCOSAL    BIOPSIES.  Additional  Information  HL7 RESULT STATUS : F  External IF Update Timestamp : 2010-03-19:15:01:00.000000  Appended Document: Upper Endoscopy let him know no infection and sorry for the delay weight loss is main need no change to GI regimen at this time  Appended Document: Upper Endoscopy patient aware

## 2011-01-05 NOTE — Progress Notes (Signed)
Summary: CT   Phone Note Call from Patient Call back at Home Phone [phone]   Caller: Patient Call For: Dr. Jarold Motto Reason for Call: Talk to Nurse Summary of Call: would like CT results Initial call taken by: Vallarie Mare,  January 23, 2010 11:16 AM  Follow-up for Phone Call        Lm for pt to call. Lupita Leash Surface RN  January 23, 2010 12:47 PM  Talked with pt.  States he was to report back today.  States miralax has helped with the contipation.  Pain is about the same.  Taking flagyl and cipro as instructed.  Pt was to return to work today but does not feel like he can go.   Follow-up by: Ashok Cordia RN,  January 26, 2010 8:11 AM  Additional Follow-up for Phone Call Additional follow up Details #1::        Pt calling back.  States he is having alot of problem with lower abd.  Pain is no better.  Unable to work.  Currently taking flagyl and cipro.  Does not seem to be working.   Additional Follow-up by: Ashok Cordia RN,  January 27, 2010 8:34 AM    Additional Follow-up for Phone Call Additional follow up Details #2::    pt needs to be worked in with Dr Leone Payor later this week- am not convinced his pain is of Gi origin.thanks-Amy Follow-up by: Sammuel Cooper PA-c,  January 27, 2010 2:26 PM   Appended Document: CT Called pt and advised him Amy Monica Becton PA-C thinks he needs to follow up with Dr. Leone Payor.  Let him know we are not sure this is a GI problem but she is suggesting he see Dr. Leone Payor.

## 2011-01-05 NOTE — Letter (Signed)
Summary: New Patient letter  [facility]  [address] [name], [place] [address]   Phone: [phone]  Fax: [phone]       01/15/2010 MRN: [id number]  [facility] [address] [place], [place]  [address]  Dear [name],  Welcome to the Gastroenterology Division at [facility].    You are scheduled to see Dr. Leone Payor on 02-23-10 at 10:30a.m. on the 3rd floor at [facility], [address].  We ask that you try to arrive at our office 15 minutes prior to your appointment time to allow for check-in.  We would like you to complete the enclosed self-administered evaluation form prior to your visit and bring it with you on the day of your appointment.  We will review it with you.  Also, please bring a complete list of all your medications or, if you prefer, bring the medication bottles and we will list them.  Please bring your insurance card so that we may make a copy of it.  If your insurance requires a referral to see a specialist, please bring your referral form from your primary care physician.  Co-payments are due at the time of your visit and may be paid by cash, check or credit card.     Your office visit will consist of a consult with your physician (includes a physical exam), any laboratory testing he/she may order, scheduling of any necessary diagnostic testing (e.g. x-ray, ultrasound, CT-scan), and scheduling of a procedure (e.g. Endoscopy, Colonoscopy) if required.  Please allow enough time on your schedule to allow for any/all of these possibilities.    If you cannot keep your appointment, please call [phone] to cancel or reschedule prior to your appointment date.  This allows [place] the opportunity to schedule an appointment for another patient in need of care.  If you do not cancel or reschedule by 5 p.m. the business day prior to your appointment date, you will be charged a $50.00 late cancellation/no-show fee.    Thank you for choosing  [facility] for your medical needs.  We appreciate the opportunity to care for you.  Please visit [place] at our website  to learn more about our practice.                     Sincerely,                                                             The Gastroenterology Division

## 2011-01-05 NOTE — Medication Information (Signed)
Summary: rov/vb  Anticoagulant Therapy  Managed by: Cloyde Reams, RN, BSN Referring MD: Nona Dell Supervising MD: Riley Kill MD, Maisie Fus Indication 1: Pulmonary Embolism and Infarction (ICD-415.1) Lab Used: [facility] Site: [facility] INR POC 3.1 INR RANGE 2 - 3  Dietary changes: no    Health status changes: no    Bleeding/hemorrhagic complications: no    Recent/future hospitalizations: no    Any changes in medication regimen? no    Recent/future dental: no  Any missed doses?: no       Is patient compliant with meds? yes       Allergies (verified): No Known Drug Allergies  Anticoagulation Management History:      The patient is taking warfarin and comes in today for a routine follow up visit.  Positive risk factors for bleeding include presence of serious comorbidities.  Negative risk factors for bleeding include an age less than 55 years old.  The bleeding index is 'intermediate risk'.  Positive CHADS2 values include History of HTN and History of Diabetes.  Negative CHADS2 values include Age > 42 years old.  The start date was 08/05/2005.  Anticoagulation responsible provider: Riley Kill MD, Maisie Fus.  INR POC: 3.1.  Cuvette Lot#: [id number].  Exp: 01/2011.    Anticoagulation Management Assessment/Plan:      The patient's current anticoagulation dose is Coumadin 5 mg tabs: Take as directed by coumadin clinic..  The target INR is 2 - 3.  The next INR is due 01/05/2010.  Anticoagulation instructions were given to patient.  Results were reviewed/authorized by Cloyde Reams, RN, BSN.  He was notified by Cloyde Reams RN.         Prior Anticoagulation Instructions: INR: 2.2  Continue the current dosing regimen of 2 tablets daily except 2 1/2 tablets on Mondays, Wednesdays and Fridays.  Recheck in 4 weeks.  Current Anticoagulation Instructions: INR 3.1  Take 1 tablet tomorrow then resume same dosage 2 tablets daily except 2.5 tablets on Mondays, Wednesdays, and Fridays.   Recheck in 4 weeks.

## 2011-01-05 NOTE — Progress Notes (Signed)
Summary: [facility]   Imported By: Sherian Rein 01/30/2010 15:16:11  _____________________________________________________________________  External Attachment:    Type:   Image     Comment:   External Document

## 2011-01-05 NOTE — Progress Notes (Signed)
Summary: triage   Phone Note Call from Patient Call back at Home Phone [phone]   Caller: friend, Rosalita Chessman Call For: Dr. Leone Payor Reason for Call: Talk to Nurse Summary of Call: friend called in to report that pt is extremely weak and having very severe abd pain... pt says that meds that were rx'ed to him Monday are making his symptoms worse... friend says to call pt back directly  Initial call taken by: Vallarie Mare,  March 05, 2010 4:11 PM  Follow-up for Phone Call        Talked with pt.  States medication given on Monday has not helped. Having a bad day toady and could not go to work.  Pt states he is trying to hold on until he can have procedures done.  Taking Ultram daily.  States bowels are moving, no nausea or vomiting.  Pain is in the med to lower abd. Any other suggestions? Follow-up by: Ashok Cordia RN,  March 05, 2010 4:29 PM  Additional Follow-up for Phone Call Additional follow up Details #1::        was it [place] or him re: scheduling that far out ? if I can do it sooner? Iva Boop MD, [facility]  March 05, 2010 6:54 PM     Additional Follow-up for Phone Call Additional follow up Details #2::    see [facility] note 03/09/10 Follow-up by: Francee Piccolo CMA Duncan Dull),  March 12, 2010 2:55 PM

## 2011-01-05 NOTE — Assessment & Plan Note (Signed)
Summary: STOMACH PAIN-WEAK --ER IF WORSEN--STC   Vital Signs:  Patient profile:   55 year old male Height:      74 inches Weight:      378 pounds BMI:     48.71 O2 Sat:      98 % on Room air Temp:     97.3 degrees F oral Pulse rate:   78 / minute BP sitting:   102 / 68  (left arm) Cuff size:   large  Vitals Entered ByZella Ball Ewing (January 12, 2010 11:38 AM)  O2 Flow:  Room air  CC: stomach pain/RE   CC:  stomach pain/RE.  History of Present Illness: here with 10 days onset general abd pain, bloating, nausea (no vomiting) and obvious constipation, but no fever, back pain, blood.  Having increased mild general weakness and midl dizziness but can keep down fluids.  Pt denies CP, sob, doe, wheezing, orthopnea, pnd, worsening LE edema, palps,  or syncope   Pt denies new neuro symptoms such as headache, facial or extremity weakness   Pt denies polydipsia, polyuria, or low sugar symptoms such as shakiness improved with eating.  Overall good compliance with meds, trying to follow low chol, DM diet, wt stable, little excercise however .  Did take a laxative which caused improvement for just one day, then worse again. Last colon march 2008 - essentialy neg.  Problems Prior to Update: 1)  Abdominal Pain, Generalized  (ICD-789.07) 2)  Back Pain  (ICD-724.5) 3)  Degenerative Joint Disease, Knees, Bilateral  (ICD-715.96) 4)  Wheezing  (ICD-786.07) 5)  Smoker  (ICD-305.1) 6)  Wheezing  (ICD-786.07) 7)  Preventive Health Care  (ICD-V70.0) 8)  Abdominal Pain, Generalized  (ICD-789.07) 9)  Preoperative Examination  (ICD-V72.84) 10)  Leg Pain, Left  (ICD-729.5) 11)  Obesity, Morbid  (ICD-278.01) 12)  Preventive Health Care  (ICD-V70.0) 13)  Hypogonadism, Male  (ICD-257.2) 14)  Uti  (ICD-599.0) 15)  Colonic Polyps, Hx of  (ICD-V12.72) 16)  Renal Insufficiency  (ICD-588.9) 17)  Depression  (ICD-311) 18)  Diverticulosis, Colon  (ICD-562.10) 19)  Hypertension  (ICD-401.9) 20)   Hyperlipidemia  (ICD-272.4) 21)  Diabetes Mellitus, Type II  (ICD-250.00)  Medications Prior to Update: 1)  Actos 45 Mg Tabs (Pioglitazone Hcl) .Marland Kitchen.. 1 Tablet By Mouth Once A Day 2)  Aurora Lancet Super Thin 30g  Misc (Lancets) .... Use 1 Stick As Directed Three Times A Day 3)  Simvastatin 80 Mg  Tabs (Simvastatin) .Marland Kitchen.. 1 By Mouth Once Daily 4)  Onetouch Ultra Test  Strp (Glucose Blood) .... Use 1 Strip Three Times A Day 5)  Zetia 10 Mg Tabs (Ezetimibe) .... Take 1 Tablet By Mouth Once A Day 6)  Diabeta 5 Mg  Tabs (Glyburide) .Marland Kitchen.. 1 By Mouth Once Daily 7)  Lisinopril 10 Mg  Tabs (Lisinopril) .Marland Kitchen.. 1 By Mouth Once Daily 8)  Ecotrin Low Strength 81 Mg  Tbec (Aspirin) .Marland Kitchen.. 1 By Mouth Qd 9)  Spironolactone-Hctz 25-25 Mg  Tabs (Spironolactone-Hctz) .Marland Kitchen.. 1 By Mouth Once Daily 10)  Tramadol Hcl 50 Mg Tabs (Tramadol Hcl) .Marland Kitchen.. 1 - 2 By Mouth Four Times Per Day As Needed For Pain 11)  Aciphex 20 Mg Tbec (Rabeprazole Sodium) .Marland Kitchen.. 1 By Mouth Once Daily 12)  Coumadin 5 Mg Tabs (Warfarin Sodium) .... Take As Directed By Coumadin Clinic. 13)  Proair Hfa 108 (90 Base) Mcg/act Aers (Albuterol Sulfate) .... 2 Puffs Four Times Per Day As Needed For Shortness of Breath 14)  Oxycodone  Hcl 5 Mg Caps (Oxycodone Hcl) .Marland Kitchen.. 1 - 3 By Mouth Q 6 Hrs As Needed Pain 15)  Flexeril 5 Mg Tabs (Cyclobenzaprine Hcl) .Marland Kitchen.. 1 By Mouth Three Times A Day As Needed 16)  Tramadol Hcl 50 Mg Tabs (Tramadol Hcl) .... Take 1 Tab Dailyprn  Current Medications (verified): 1)  Actos 45 Mg Tabs (Pioglitazone Hcl) .Marland Kitchen.. 1 Tablet By Mouth Once A Day 2)  Aurora Lancet Super Thin 30g  Misc (Lancets) .... Use 1 Stick As Directed Three Times A Day 3)  Simvastatin 80 Mg  Tabs (Simvastatin) .Marland Kitchen.. 1 By Mouth Once Daily 4)  Onetouch Ultra Test  Strp (Glucose Blood) .... Use 1 Strip Three Times A Day 5)  Zetia 10 Mg Tabs (Ezetimibe) .... Take 1 Tablet By Mouth Once A Day 6)  Diabeta 5 Mg  Tabs (Glyburide) .Marland Kitchen.. 1 By Mouth Once Daily 7)  Lisinopril 10 Mg   Tabs (Lisinopril) .Marland Kitchen.. 1 By Mouth Once Daily 8)  Ecotrin Low Strength 81 Mg  Tbec (Aspirin) .Marland Kitchen.. 1 By Mouth Qd 9)  Spironolactone-Hctz 25-25 Mg  Tabs (Spironolactone-Hctz) .Marland Kitchen.. 1 By Mouth Once Daily 10)  Tramadol Hcl 50 Mg Tabs (Tramadol Hcl) .Marland Kitchen.. 1 - 2 By Mouth Four Times Per Day As Needed For Pain 11)  Aciphex 20 Mg Tbec (Rabeprazole Sodium) .Marland Kitchen.. 1 By Mouth Once Daily 12)  Coumadin 5 Mg Tabs (Warfarin Sodium) .... Take As Directed By Coumadin Clinic. 13)  Proair Hfa 108 (90 Base) Mcg/act Aers (Albuterol Sulfate) .... 2 Puffs Four Times Per Day As Needed For Shortness of Breath 14)  Oxycodone Hcl 5 Mg Caps (Oxycodone Hcl) .Marland Kitchen.. 1 - 3 By Mouth Q 6 Hrs As Needed Pain 15)  Flexeril 5 Mg Tabs (Cyclobenzaprine Hcl) .Marland Kitchen.. 1 By Mouth Three Times A Day As Needed 16)  Tramadol Hcl 50 Mg Tabs (Tramadol Hcl) .... Take 1 Tab Dailyprn 17)  Promethazine Hcl 25 Mg Tabs (Promethazine Hcl) .Marland Kitchen.. 1po Q 6 Hrs As Needed Nausea 18)  Golytely 236 Gm Solr (Peg 3350-Kcl-Nabcb-Nacl-Nasulf) .... Use Asd 8 Oz Every 30 Min  Allergies (verified): No Known Drug Allergies  Past History:  Past Medical History: Last updated: 03/04/2008 Diabetes mellitus, type II Hyperlipidemia Hypertension kidney stones,knee surgery,ankel surgery,bone spurs,torn ligaent Obstructive Sleep Apnea DVT- R Lower Extremity Pulmonary Embolus Diverticulosis, colon Depression Renal insufficiency Colonic polyps, hx of hypogonadism  Past Surgical History: Last updated: 06/30/2007 Knee Surgery- 2000 & 2003, cartilage damage Tonsillectomy and Adenoidectomy Ankle Surgery with bone spurs and torn tendon- 2001  Social History: Last updated: 03/11/2009 Former Smoker Alcohol use-no Divorced 2 children work - back to work at [employer] since jan 2010  Risk Factors: Smoking Status: quit (10/05/2007)  Review of Systems       all otherwise negative per pt - no GU symptoms such as pain or freq or blood  Physical  Exam  General:  alert and overweight-appearing., uncomfortable and queezy appearing Head:  normocephalic and atraumatic.   Eyes:  vision grossly intact, pupils equal, and pupils round.   Ears:  R ear normal and L ear normal.   Nose:  no external deformity and no nasal discharge.   Mouth:  no gingival abnormalities and pharynx pink and moist.   Neck:  supple and no masses.   Lungs:  normal respiratory effort and normal breath sounds.   Heart:  normal rate and regular rhythm.   Abdomen:  soft, non-tender, normal bowel sounds, no distention, no masses, no guarding, and no rebound  tenderness.   Msk:  no joint tenderness and no joint swelling.   Extremities:  no edema, no erythema  Neurologic:  cranial nerves II-XII intact and strength normal in all extremities.     Impression & Recommendations:  Problem # 1:  ABDOMINAL PAIN, GENERALIZED (ICD-789.07)  with nausea and mid abd pain but benign exam except for obvious nausea , malaise and weakness;  diff includes constipation, h pylori, GB, pancreattiis, doubt peptic with this hx;  most likely is constipation  Orders: TLB-BMP (Basic Metabolic Panel-BMET) (80048-METABOL) TLB-CBC Platelet - w/Differential (85025-CBCD) TLB-Hepatic/Liver Function Pnl (80076-HEPATIC) TLB-Lipase (83690-LIPASE) TLB-Udip ONLY (81003-UDIP) TLB-H. Pylori Abs(Helicobacter Pylori) (86677-HELICO) T-Acute Abdomen (2 view w/ PA & Chest (29528UX)  Problem # 2:  DIABETES MELLITUS, TYPE II (ICD-250.00)  His updated medication list for this problem includes:    Actos 45 Mg Tabs (Pioglitazone hcl) .Marland Kitchen... 1 tablet by mouth once a day    Diabeta 5 Mg Tabs (Glyburide) .Marland Kitchen... 1 by mouth once daily    Lisinopril 10 Mg Tabs (Lisinopril) .Marland Kitchen... 1 by mouth once daily    Ecotrin Low Strength 81 Mg Tbec (Aspirin) .Marland Kitchen... 1 by mouth qd  Orders: TLB-A1C / Hgb A1C (Glycohemoglobin) (83036-A1C)  Labs Reviewed: Creat: 1.0 (09/11/2009)    Reviewed HgBA1c results: 6.7 (09/11/2009)  6.5  (08/30/2008) stable overall by hx and exam, ok to continue meds/tx as is, but should hold the glipizide for safetly if not eating well  Complete Medication List: 1)  Actos 45 Mg Tabs (Pioglitazone hcl) .Marland Kitchen.. 1 tablet by mouth once a day 2)  Aurora Lancet Super Thin 30g Misc (Lancets) .... Use 1 stick as directed three times a day 3)  Simvastatin 80 Mg Tabs (Simvastatin) .Marland Kitchen.. 1 by mouth once daily 4)  Onetouch Ultra Test Strp (Glucose blood) .... Use 1 strip three times a day 5)  Zetia 10 Mg Tabs (Ezetimibe) .... Take 1 tablet by mouth once a day 6)  Diabeta 5 Mg Tabs (Glyburide) .Marland Kitchen.. 1 by mouth once daily 7)  Lisinopril 10 Mg Tabs (Lisinopril) .Marland Kitchen.. 1 by mouth once daily 8)  Ecotrin Low Strength 81 Mg Tbec (Aspirin) .Marland Kitchen.. 1 by mouth qd 9)  Spironolactone-hctz 25-25 Mg Tabs (Spironolactone-hctz) .Marland Kitchen.. 1 by mouth once daily 10)  Tramadol Hcl 50 Mg Tabs (Tramadol hcl) .Marland Kitchen.. 1 - 2 by mouth four times per day as needed for pain 11)  Aciphex 20 Mg Tbec (Rabeprazole sodium) .Marland Kitchen.. 1 by mouth once daily 12)  Coumadin 5 Mg Tabs (Warfarin sodium) .... Take as directed by coumadin clinic. 13)  Proair Hfa 108 (90 Base) Mcg/act Aers (Albuterol sulfate) .... 2 puffs four times per day as needed for shortness of breath 14)  Oxycodone Hcl 5 Mg Caps (Oxycodone hcl) .Marland Kitchen.. 1 - 3 by mouth q 6 hrs as needed pain 15)  Flexeril 5 Mg Tabs (Cyclobenzaprine hcl) .Marland Kitchen.. 1 by mouth three times a day as needed 16)  Tramadol Hcl 50 Mg Tabs (Tramadol hcl) .... Take 1 tab dailyprn 17)  Promethazine Hcl 25 Mg Tabs (Promethazine hcl) .Marland Kitchen.. 1po q 6 hrs as needed nausea 18)  Golytely 236 Gm Solr (Peg 3350-kcl-nabcb-nacl-nasulf) .... Use asd 8 oz every 30 min  Patient Instructions: 1)  Please take all new medications as prescribed - the phenergan was sent to the pharmacy 2)  OK to not take the glipizide if you are not eating well 3)  Continue all previous medications as before this visit  4)  Please go to the Lab in  the basement for  your blood and/or urine tests today  5)  Please go to Radiology in the basement level for your X-Ray today  6)  We should have the results of the xray very soon -  if the result shows no obstruction , then try Magnesium Citrate OTC first, then the prescription golytely if  needed for constipation 7)  Please schedule a follow-up appointment in 4 months with :CPX labs and: 8)  HbgA1C prior to visit, ICD-9: 250.02 9)  Urine Microalbumin prior to visit, ICD-9: Prescriptions: GOLYTELY 236 GM SOLR (PEG 3350-KCL-NABCB-NACL-NASULF) use asd 8 oz every 30 min  #1 x 0   Entered and Authorized by:   Corwin Levins MD   Signed by:   Corwin Levins MD on 01/12/2010   Method used:   Print then Give to Patient   RxID:   [id number] PROMETHAZINE HCL 25 MG TABS (PROMETHAZINE HCL) 1po q 6 hrs as needed nausea  #60 x 1   Entered and Authorized by:   Corwin Levins MD   Signed by:   Corwin Levins MD on 01/12/2010   Method used:   Electronically to        [facility]  [id number] (retail)       [address]       [place], [place]  [address]       Ph: [phone] or [phone]       Fax: [phone]   RxID:   [id number]

## 2011-01-05 NOTE — Medication Information (Signed)
Summary: rov/ewj  Anticoagulant Therapy  Managed by: Weston Brass, PharmD Referring MD: Nona Dell PCP: Oliver Barre, MD Supervising MD: Clifton Corbyn Steedman MD, Cristal Deer Indication 1: Pulmonary Embolism and Infarction (ICD-415.1) Lab Used: [facility] Site: [facility] INR POC 1.8 INR RANGE 2 - 3  Dietary changes: no    Health status changes: no    Bleeding/hemorrhagic complications: no    Recent/future hospitalizations: no    Any changes in medication regimen? yes       Details: Actos will changed to Kombiglyze  Recent/future dental: no  Any missed doses?: no       Is patient compliant with meds? yes       Allergies: No Known Drug Allergies  Anticoagulation Management History:      The patient is taking warfarin and comes in today for a routine follow up visit.  Positive risk factors for bleeding include presence of serious comorbidities.  Negative risk factors for bleeding include an age less than 90 years old.  The bleeding index is 'intermediate risk'.  Positive CHADS2 values include History of HTN and History of Diabetes.  Negative CHADS2 values include Age > 45 years old.  The start date was 08/05/2005.  Anticoagulation responsible provider: Clifton Caitriona Sundquist MD, Cristal Deer.  INR POC: 1.8.  Cuvette Lot#: [id number].  Exp: 10/2011.    Anticoagulation Management Assessment/Plan:      The patient's current anticoagulation dose is Coumadin 5 mg tabs: Take as directed by coumadin clinic..  The target INR is 2 - 3.  The next INR is due 11/05/2010.  Anticoagulation instructions were given to patient.  Results were reviewed/authorized by Weston Brass, PharmD.  He was notified by Hoy Register, PharmD Candidate.         Prior Anticoagulation Instructions: INR 2.5  Continue on same dosage 2 tablets daily except 2.5 tablets on Tuesdays, Thursdays, and Saturdays.  Recheck in 3 weeks.    Current Anticoagulation Instructions: INR 1.8 Take 3 tablets today and 2.5 tablets tomorrow then resume  previous dose of 2 tablets everyday except 2.5 on Tuesday, Thursday, and Saturaday Recheck INR in 2 weeks

## 2011-01-05 NOTE — Assessment & Plan Note (Signed)
Summary: 2 WK FU--STC   Vital Signs:  Patient profile:   55 year old male Height:      74 inches Weight:      378 pounds BMI:     48.71 O2 Sat:      94 % on Room air Temp:     98.2 degrees F oral Pulse rate:   80 / minute BP sitting:   98 / 60  (left arm) Cuff size:   large  Vitals Entered ByMarland Kitchen Zella Ball Ewing (February 27, 2010 9:22 AM)  O2 Flow:  Room air CC: 2 week followup/RE   Primary Care Provider:  Oliver Barre, MD  CC:  2 week followup/RE.  History of Present Illness: back to work since mar 15;  not taking the ace or diuretic as instructed;  states "it seemed like it was getting better" the week prior to going back to work, but seemed worse again after went back to work; pain seems there "all the time" but also has to bend frequently at work, symptoms seem aggrevated and seems to "take my breath" and has nausea, feels bad,  has to sit let it pass, for a few minutes.  Would like to takeup to 10 min but cant sit that long since he would get into trouble.  Works in [employer] worker business;  has had verbal warning and likely written warnings may start soon, in the process of work eval that may lead to being let go from his job for 30 yrs.  He is member of union.  Also wiht chronic pain to the lower back, knees and legs.    Problems Prior to Update: 1)  Dehydration  (ICD-276.51) 2)  Hypotension, Orthostatic  (ICD-458.0) 3)  Constipation  (ICD-564.00) 4)  Coumadin Therapy  (ICD-V58.61) 5)  Obesity  (ICD-278.00) 6)  Abdominal Pain-llq  (ICD-789.04) 7)  Abdominal Pain, Generalized  (ICD-789.07) 8)  Back Pain  (ICD-724.5) 9)  Degenerative Joint Disease, Knees, Bilateral  (ICD-715.96) 10)  Wheezing  (ICD-786.07) 11)  Smoker  (ICD-305.1) 12)  Wheezing  (ICD-786.07) 13)  Leg Pain, Left  (ICD-729.5) 14)  Obesity, Morbid  (ICD-278.01) 15)  Hypogonadism, Male  (ICD-257.2) 16)  Uti  (ICD-599.0) 17)  Colonic Polyps, Hx of  (ICD-V12.72) 18)  Renal Insufficiency  (ICD-588.9) 19)  Depression   (ICD-311) 20)  Diverticulosis, Colon  (ICD-562.10) 21)  Hypertension  (ICD-401.9) 22)  Hyperlipidemia  (ICD-272.4) 23)  Diabetes Mellitus, Type II  (ICD-250.00)  Medications Prior to Update: 1)  Actos 45 Mg Tabs (Pioglitazone Hcl) .Marland Kitchen.. 1 Tablet By Mouth Once A Day 2)  Aurora Lancet Super Thin 30g  Misc (Lancets) .... Use 1 Stick As Directed Three Times A Day 3)  Simvastatin 80 Mg  Tabs (Simvastatin) .Marland Kitchen.. 1 By Mouth Once Daily 4)  Onetouch Ultra Test  Strp (Glucose Blood) .... Use 1 Strip Three Times A Day 5)  Zetia 10 Mg Tabs (Ezetimibe) .... Take 1 Tablet By Mouth Once A Day 6)  Glipizide 10 Mg Tabs (Glipizide) .Marland Kitchen.. 1 By Mouth Two Times A Day 7)  Lisinopril 5 Mg Tabs (Lisinopril) .Marland Kitchen.. 1 By Mouth Once Daily 8)  Ecotrin Low Strength 81 Mg  Tbec (Aspirin) .Marland Kitchen.. 1 By Mouth Qd 9)  Hydrochlorothiazide 25 Mg Tabs (Hydrochlorothiazide) .Marland Kitchen.. 1po Once Daily 10)  Tramadol Hcl 50 Mg Tabs (Tramadol Hcl) .Marland Kitchen.. 1 - 2 By Mouth Four Times Per Day As Needed For Pain 11)  Aciphex 20 Mg Tbec (Rabeprazole Sodium) .Marland Kitchen.. 1 By Mouth Once  Daily 12)  Coumadin 5 Mg Tabs (Warfarin Sodium) .... Take As Directed By Coumadin Clinic. 13)  Proair Hfa 108 (90 Base) Mcg/act Aers (Albuterol Sulfate) .... 2 Puffs Four Times Per Day As Needed For Shortness of Breath 14)  Oxycodone Hcl 5 Mg Caps (Oxycodone Hcl) .Marland Kitchen.. 1 - 3 By Mouth Q 6 Hrs As Needed Pain 15)  Flexeril 5 Mg Tabs (Cyclobenzaprine Hcl) .Marland Kitchen.. 1 By Mouth Three Times A Day As Needed 16)  Tramadol Hcl 50 Mg Tabs (Tramadol Hcl) .... Take 1 Tab Dailyprn 17)  Promethazine Hcl 25 Mg Tabs (Promethazine Hcl) .Marland Kitchen.. 1po Q 6 Hrs As Needed Nausea 18)  Miralax  Powd (Polyethylene Glycol 3350) .... Mix 1 Capful (17 Grams) in 8 Oz Water Daily 19)  Phillips Milk of Magnesia 7.75 % Susp (Magnesium Hydroxide) .... Take Capful As Needed  Current Medications (verified): 1)  Actos 45 Mg Tabs (Pioglitazone Hcl) .Marland Kitchen.. 1 Tablet By Mouth Once A Day 2)  Aurora Lancet Super Thin 30g  Misc  (Lancets) .... Use 1 Stick As Directed Three Times A Day 3)  Simvastatin 80 Mg  Tabs (Simvastatin) .Marland Kitchen.. 1 By Mouth Once Daily 4)  Onetouch Ultra Test  Strp (Glucose Blood) .... Use 1 Strip Three Times A Day 5)  Zetia 10 Mg Tabs (Ezetimibe) .... Take 1 Tablet By Mouth Once A Day 6)  Glipizide 10 Mg Tabs (Glipizide) .Marland Kitchen.. 1 By Mouth Two Times A Day 7)  Lisinopril 5 Mg Tabs (Lisinopril) .Marland Kitchen.. 1 By Mouth Once Daily 8)  Ecotrin Low Strength 81 Mg  Tbec (Aspirin) .Marland Kitchen.. 1 By Mouth Qd 9)  Hydrochlorothiazide 25 Mg Tabs (Hydrochlorothiazide) .Marland Kitchen.. 1po Once Daily 10)  Tramadol Hcl 50 Mg Tabs (Tramadol Hcl) .Marland Kitchen.. 1 - 2 By Mouth Four Times Per Day As Needed For Pain 11)  Aciphex 20 Mg Tbec (Rabeprazole Sodium) .Marland Kitchen.. 1 By Mouth Once Daily 12)  Coumadin 5 Mg Tabs (Warfarin Sodium) .... Take As Directed By Coumadin Clinic. 13)  Proair Hfa 108 (90 Base) Mcg/act Aers (Albuterol Sulfate) .... 2 Puffs Four Times Per Day As Needed For Shortness of Breath 14)  Flexeril 5 Mg Tabs (Cyclobenzaprine Hcl) .Marland Kitchen.. 1 By Mouth Three Times A Day As Needed 15)  Promethazine Hcl 25 Mg Tabs (Promethazine Hcl) .Marland Kitchen.. 1po Q 6 Hrs As Needed Nausea 16)  Miralax  Powd (Polyethylene Glycol 3350) .... Mix 1 Capful (17 Grams) in 8 Oz Water Daily 17)  Phillips Milk of Magnesia 7.75 % Susp (Magnesium Hydroxide) .... Take Capful As Needed 18)  Cymbalta 60 Mg Cpep (Duloxetine Hcl) .Marland Kitchen.. 1 By Mouth Once Daily 19)  Phentermine Hcl 37.5 Mg Caps (Phentermine Hcl) .Marland Kitchen.. 1po Once Daily  Allergies (verified): No Known Drug Allergies  Past History:  Past Medical History: Last updated: 01/21/2010 Diabetes mellitus, type II Hyperlipidemia Hypertension kidney stones,knee surgery,ankel surgery,bone spurs,torn ligaent Obstructive Sleep Apnea DVT- R Lower Extremity Pulmonary Embolus Diverticulosis, colon Depression Renal insufficiency Colonic polyps, hx of hypogonadism Obesity  Past Surgical History: Last updated: 01/21/2010 Knee Surgery- 05-08-99 &  2002-05-07, cartilage damage Tonsillectomy and Adenoidectomy Ankle Surgery with bone spurs and torn tendon- 05/07/00 bilateral  COLONOSCOPY 2005-DIVERTICULOSIS/HYPERPLASTIS POLYPS-DUE FOR F/U 05-07-2014 EGD 2008-CHRONIC DUODENITIS  Family History: Last updated: 01/21/2010 father with prostate cancer mother with HTN sister died with DM 05-07-2001 brother and 2 more sisters with DM No FH of Colon Cancer:  Social History: Last updated: 01/21/2010 Former Smoker Alcohol use-no Divorced 2 children work - back to work at [employer] since  jan 2010 Daily Caffeine Use one per day  Risk Factors: Smoking Status: quit (10/05/2007)  Review of Systems       all otherwise negative per pt -    Physical Exam  General:  alert and overweight-appearing.   Head:  normocephalic and atraumatic.   Eyes:  vision grossly intact, pupils equal, and pupils round.   Ears:  R ear normal and L ear normal.   Nose:  no external deformity and no nasal discharge.   Mouth:  no gingival abnormalities and pharynx pink and moist.   Neck:  supple and no masses.   Lungs:  normal respiratory effort and normal breath sounds.   Heart:  normal rate and regular rhythm.   Abdomen:  soft, non-tender, and normal bowel sounds.   Extremities:  no erythema , no ulcers   Impression & Recommendations:  Problem # 1:  OBESITY (ICD-278.00) severe morbid, now with impending job loss due to obesity related issues as above - GI and orthopedic issues ;  to start the phentermine asd  Problem # 2:  ABDOMINAL PAIN, GENERALIZED (ICD-789.07) for phenergan as needed nausea, recurrent, Continue all previous medications as before this visit ; consider colonoscopy per GI but declines at this time  Problem # 3:  DEGENERATIVE JOINT DISEASE, KNEES, BILATERAL (ICD-715.96)  The following medications were removed from the medication list:    Oxycodone Hcl 5 Mg Caps (Oxycodone hcl) .Marland Kitchen... 1 - 3 by mouth q 6 hrs as needed pain    Tramadol Hcl 50 Mg  Tabs (Tramadol hcl) .Marland Kitchen... Take 1 tab dailyprn His updated medication list for this problem includes:    Ecotrin Low Strength 81 Mg Tbec (Aspirin) .Marland Kitchen... 1 by mouth qd    Tramadol Hcl 50 Mg Tabs (Tramadol hcl) .Marland Kitchen... 1 - 2 by mouth four times per day as needed for pain    Flexeril 5 Mg Tabs (Cyclobenzaprine hcl) .Marland Kitchen... 1 by mouth three times a day as needed Continue all previous medications as before this visit, phentermine for wt loss as above, consider orthopedic consult but declines at this time  Problem # 4:  DEPRESSION (ICD-311)  add the cymbalta - now approved for orthopedic pain and depression; f/u 4 wks  His updated medication list for this problem includes:    Cymbalta 60 Mg Cpep (Duloxetine hcl) .Marland Kitchen... 1 by mouth once daily  Problem # 5:  HYPERTENSION (ICD-401.9)  His updated medication list for this problem includes:    Lisinopril 5 Mg Tabs (Lisinopril) .Marland Kitchen... 1 by mouth once daily    Hydrochlorothiazide 25 Mg Tabs (Hydrochlorothiazide) .Marland Kitchen... 1po once daily  BP today: 98/60 Prior BP: 100/68 (02/13/2010)  Labs Reviewed: K+: 4.6 (01/29/2010) Creat: : 1.3 (01/29/2010)   Chol: 132 (09/11/2009)   HDL: 40.00 (09/11/2009)   LDL: 79 (09/11/2009)   TG: 66.0 (09/11/2009) stable overall by hx and exam, ok to continue meds/tx as is   Complete Medication List: 1)  Actos 45 Mg Tabs (Pioglitazone hcl) .Marland Kitchen.. 1 tablet by mouth once a day 2)  Aurora Lancet Super Thin 30g Misc (Lancets) .... Use 1 stick as directed three times a day 3)  Simvastatin 80 Mg Tabs (Simvastatin) .Marland Kitchen.. 1 by mouth once daily 4)  Onetouch Ultra Test Strp (Glucose blood) .... Use 1 strip three times a day 5)  Zetia 10 Mg Tabs (Ezetimibe) .... Take 1 tablet by mouth once a day 6)  Glipizide 10 Mg Tabs (Glipizide) .Marland Kitchen.. 1 by mouth two times a day 7)  Lisinopril 5 Mg Tabs (Lisinopril) .Marland Kitchen.. 1 by mouth once daily 8)  Ecotrin Low Strength 81 Mg Tbec (Aspirin) .Marland Kitchen.. 1 by mouth qd 9)  Hydrochlorothiazide 25 Mg Tabs  (Hydrochlorothiazide) .Marland Kitchen.. 1po once daily 10)  Tramadol Hcl 50 Mg Tabs (Tramadol hcl) .Marland Kitchen.. 1 - 2 by mouth four times per day as needed for pain 11)  Aciphex 20 Mg Tbec (Rabeprazole sodium) .Marland Kitchen.. 1 by mouth once daily 12)  Coumadin 5 Mg Tabs (Warfarin sodium) .... Take as directed by coumadin clinic. 13)  Proair Hfa 108 (90 Base) Mcg/act Aers (Albuterol sulfate) .... 2 puffs four times per day as needed for shortness of breath 14)  Flexeril 5 Mg Tabs (Cyclobenzaprine hcl) .Marland Kitchen.. 1 by mouth three times a day as needed 15)  Promethazine Hcl 25 Mg Tabs (Promethazine hcl) .Marland Kitchen.. 1po q 6 hrs as needed nausea 16)  Miralax Powd (Polyethylene glycol 3350) .... Mix 1 capful (17 grams) in 8 oz water daily 17)  Phillips Milk of Magnesia 7.75 % Susp (Magnesium hydroxide) .... Take capful as needed 18)  Cymbalta 60 Mg Cpep (Duloxetine hcl) .Marland Kitchen.. 1 by mouth once daily 19)  Phentermine Hcl 37.5 Mg Caps (Phentermine hcl) .Marland Kitchen.. 1po once daily  Patient Instructions: 1)  Please take all new medications as prescribed - the cymbalta starts at 30 mg per day for 7 days, then  60 mg per day after that 2)  start the phentermine at 1 per day 3)  take the phenergan for nausea if needed 4)  OK to cont work as best you can for now 5)  Continue all previous medications as before this visit  6)  Please schedule a follow-up appointment in 1 month with: 7)  BMP prior to visit, ICD-9: 250.02 8)  Lipid Panel prior to visit, ICD-9: 9)  HbgA1C prior to visit, ICD-9: Prescriptions: PHENTERMINE HCL 37.5 MG CAPS (PHENTERMINE HCL) 1po once daily  #30 x 2   Entered and Authorized by:   Corwin Levins MD   Signed by:   Corwin Levins MD on 02/27/2010   Method used:   Print then Give to Patient   RxID:   [id number] TRAMADOL HCL 50 MG TABS (TRAMADOL HCL) 1 - 2 by mouth four times per day as needed for pain  #240 x 2   Entered and Authorized by:   Corwin Levins MD   Signed by:   Corwin Levins MD on 02/27/2010   Method used:   Print then  Give to Patient   RxID:   [id number] PROMETHAZINE HCL 25 MG TABS (PROMETHAZINE HCL) 1po q 6 hrs as needed nausea  #60 x 1   Entered and Authorized by:   Corwin Levins MD   Signed by:   Corwin Levins MD on 02/27/2010   Method used:   Print then Give to Patient   RxID:   [id number] CYMBALTA 60 MG CPEP (DULOXETINE HCL) 1 by mouth once daily  #30 x 11   Entered and Authorized by:   Corwin Levins MD   Signed by:   Corwin Levins MD on 02/27/2010   Method used:   Print then Give to Patient   RxID:   [id number]

## 2011-01-05 NOTE — Medication Information (Signed)
Summary: rov/nb   Anticoagulant Therapy  Managed by: Lyna Poser, PharmD Referring MD: Nona Dell PCP: Oliver Barre, MD Supervising MD: Eden Emms MD, Theron Arista Indication 1: Pulmonary Embolism and Infarction (ICD-415.1) Lab Used: [facility] Site: [facility] INR POC 2.4 INR RANGE 2 - 3  Dietary changes: yes       Details: ate more greens  Health status changes: no    Bleeding/hemorrhagic complications: no    Recent/future hospitalizations: no    Any changes in medication regimen? no    Recent/future dental: yes     Details: root canal in january  Any missed doses?: no       Is patient compliant with meds? yes       Allergies: No Known Drug Allergies  Anticoagulation Management History:      The patient is taking warfarin and comes in today for a routine follow up visit.  Positive risk factors for bleeding include presence of serious comorbidities.  Negative risk factors for bleeding include an age less than 78 years old.  The bleeding index is 'intermediate risk'.  Positive CHADS2 values include History of HTN and History of Diabetes.  Negative CHADS2 values include Age > 65 years old.  The start date was 08/05/2005.  Anticoagulation responsible Niani Mourer: Eden Emms MD, Theron Arista.  INR POC: 2.4.  Cuvette Lot#: [id number].  Exp: 10/2011.    Anticoagulation Management Assessment/Plan:      The patient's current anticoagulation dose is Coumadin 5 mg tabs: Take as directed by coumadin clinic..  The target INR is 2 - 3.  The next INR is due 11/26/2010.  Anticoagulation instructions were given to patient.  Results were reviewed/authorized by Lyna Poser, PharmD.         Prior Anticoagulation Instructions: INR 1.8 Take 3 tablets today and 2.5 tablets tomorrow then resume previous dose of 2 tablets everyday except 2.5 on Tuesday, Thursday, and Saturaday Recheck INR in 2 weeks  Current Anticoagulation Instructions: INR 2.4 Continue taking 2.5 tablets on tuesday, thursday, and saturday.  And 2 tablets all other days. Recheck in 3 weeks.

## 2011-01-05 NOTE — Medication Information (Signed)
Summary: rov after OV with Dr Rosana Hoes  Anticoagulant Therapy  Managed by: Elaina Pattee, PharmD Referring MD: Nona Dell PCP: Oliver Barre, MD Supervising MD: Eden Emms MD, Theron Arista Indication 1: Pulmonary Embolism and Infarction (ICD-415.1) Lab Used: [facility] Site: [facility] INR POC 3.6 INR RANGE 2 - 3  Dietary changes: yes       Details: May have eaten fewer greens.  Health status changes: yes       Details: Pt has had some diarrhea recently.  Bleeding/hemorrhagic complications: no    Recent/future hospitalizations: no    Any changes in medication regimen? no    Recent/future dental: no  Any missed doses?: no       Is patient compliant with meds? yes       Current Medications (verified): 1)  Actos 45 Mg Tabs (Pioglitazone Hcl) .Marland Kitchen.. 1 Tablet By Mouth Once A Day 2)  Aurora Lancet Super Thin 30g  Misc (Lancets) .... Use 1 Stick As Directed Three Times A Day 3)  Simvastatin 80 Mg  Tabs (Simvastatin) .Marland Kitchen.. 1 By Mouth Once Daily 4)  Onetouch Ultra Test  Strp (Glucose Blood) .... Use 1 Strip Three Times A Day 5)  Zetia 10 Mg Tabs (Ezetimibe) .... Take 1 Tablet By Mouth Once A Day 6)  Glipizide 10 Mg Tabs (Glipizide) .Marland Kitchen.. 1 By Mouth Two Times A Day 7)  Lisinopril 5 Mg Tabs (Lisinopril) .Marland Kitchen.. 1 By Mouth Once Daily 8)  Ecotrin Low Strength 81 Mg  Tbec (Aspirin) .Marland Kitchen.. 1 By Mouth Qd 9)  Hydrochlorothiazide 25 Mg Tabs (Hydrochlorothiazide) .Marland Kitchen.. 1po Once Daily 10)  Tramadol Hcl 50 Mg Tabs (Tramadol Hcl) .Marland Kitchen.. 1 - 2 By Mouth Four Times Per Day As Needed For Pain 11)  Aciphex 20 Mg Tbec (Rabeprazole Sodium) .Marland Kitchen.. 1 By Mouth Once Daily 12)  Coumadin 5 Mg Tabs (Warfarin Sodium) .... Take As Directed By Coumadin Clinic. 13)  Proair Hfa 108 (90 Base) Mcg/act Aers (Albuterol Sulfate) .... 2 Puffs Four Times Per Day As Needed For Shortness of Breath 14)  Flexeril 5 Mg Tabs (Cyclobenzaprine Hcl) .Marland Kitchen.. 1 By Mouth Three Times A Day As Needed 15)  Promethazine Hcl 25 Mg Tabs (Promethazine Hcl)  .Marland Kitchen.. 1po Q 6 Hrs As Needed Nausea 16)  Miralax  Powd (Polyethylene Glycol 3350) .... Mix 1 Capful (17 Grams) in 8 Oz Water Daily 17)  Phillips Milk of Magnesia 7.75 % Susp (Magnesium Hydroxide) .... Take Capful As Needed 18)  Cymbalta 60 Mg Cpep (Duloxetine Hcl) .Marland Kitchen.. 1 By Mouth Once Daily 19)  Phentermine Hcl 37.5 Mg Caps (Phentermine Hcl) .Marland Kitchen.. 1po Once Daily 20)  Dicyclomine Hcl 20 Mg Tabs (Dicyclomine Hcl) .Marland Kitchen.. 1 By Mouth Every 6 Hrs As Needed For Abdominal Pain  Allergies (verified): No Known Drug Allergies  Anticoagulation Management History:      The patient is taking warfarin and comes in today for a routine follow up visit.  Positive risk factors for bleeding include presence of serious comorbidities.  Negative risk factors for bleeding include an age less than 28 years old.  The bleeding index is 'intermediate risk'.  Positive CHADS2 values include History of HTN and History of Diabetes.  Negative CHADS2 values include Age > 71 years old.  The start date was 08/05/2005.  Anticoagulation responsible provider: Eden Emms MD, Theron Arista.  INR POC: 3.6.  Cuvette Lot#: [id number].  Exp: 05/2011.    Anticoagulation Management Assessment/Plan:      The patient's current anticoagulation dose is Coumadin 5 mg tabs: Take as  directed by coumadin clinic..  The target INR is 2 - 3.  The next INR is due 04/10/2010.  Anticoagulation instructions were given to patient.  Results were reviewed/authorized by Elaina Pattee, PharmD.  He was notified by Elaina Pattee, PharmD.         Prior Anticoagulation Instructions: INR 1.7  Continue Lovenox 1 injection this am and 1 injection this pm.  Take 3 tablets of coumadin today, then resume same dosage 2 tablets daily except 2.5 tablets on Mondays, Wednesdays, and Fridays.  Recheck in 1 week.    Current Anticoagulation Instructions: INR 3.6. Hold Coumadin tomorrow, then take 2 tablets daily except 2.5 tablets on Tues, Thurs, Sat.

## 2011-01-05 NOTE — Progress Notes (Signed)
Summary: Triage   Phone Note Call from Patient Call back at [phone]   Caller: Patient Call For: Dr. Leone Payor Reason for Call: Talk to Nurse Summary of Call: caling about scheduling Colon/EGD at hosp. Initial call taken by: Karna Christmas,  March 09, 2010 9:11 AM  Follow-up for Phone Call        Endo Reggie Pile [facility] 03-18-10 1;00.  Pre-visit scheduled for  03/12/10 10:00.  Patient  aware of both appointments       Follow-up by: Darcey Nora RN, CGRN,  March 10, 2010 10:30 AM

## 2011-01-05 NOTE — Letter (Signed)
Summary: [facility]  [facility]   Imported By: Sherian Rein 12/24/2009 11:40:27  _____________________________________________________________________  External Attachment:    Type:   Image     Comment:   External Document

## 2011-01-05 NOTE — Progress Notes (Signed)
Summary: NOS for Previsit   Pt. NOS for previsit.  Spoke with pt. and he is to [facility] his pv.

## 2011-01-05 NOTE — Procedures (Signed)
Summary: Colonoscopy  Patient: [name] Note: All result statuses are Final unless otherwise noted.  Tests: (1) Colonoscopy (COL)   COL Colonoscopy           DONE     [facility]     [address] [place], [place]  [address]           COLONOSCOPY PROCEDURE REPORT           PATIENT:  [name], [name]  MR#:  [id number]     BIRTHDATE:  Apr 14, 1956, 54 yrs. old  GENDER:  male     ENDOSCOPIST:  Iva Boop, MD, [facility]           PROCEDURE DATE:  03/18/2010     PROCEDURE:  Colonoscopy 19[address]     ASA CLASS:  Class III     INDICATIONS:  change in bowel habits     MEDICATIONS:   Fentanyl 20 mcg IV, There was residual sedation     effect present from prior procedure.           DESCRIPTION OF PROCEDURE:   After the risks benefits and     alternatives of the procedure were thoroughly explained, informed     consent was obtained.  Digital rectal exam was performed and     revealed no abnormalities and normal prostate.   The [id number]     ([id number]) endoscope was introduced through the anus and advanced     to the cecum, which was identified by both the appendix and     ileocecal valve, without limitations.  The quality of the prep was     excellent, using MoviPrep.  The instrument was then slowly     withdrawn as the colon was fully examined.     Insertion:6:30 mins Withdrawal: 7:30 mins     <<PROCEDUREIMAGES>>           FINDINGS:  A normal appearing cecum, ileocecal valve, and     appendiceal orifice were identified. The ascending, hepatic     flexure, transverse, splenic flexure, descending, sigmoid colon,     and rectum appeared unremarkable. In order to inspect cecum     completely it was manipulated with forceps.   Retroflexed views in     the rectum revealed no abnormalities.    The scope was then     withdrawn from the patient and the procedure completed.           COMPLICATIONS:  None     ENDOSCOPIC IMPRESSION:     1) Normal colonoscopy, excellent prep  RECOMMENDATIONS:     Weight loss     Continue MiraLax for constipation, titrate for effect.     Resume warfarin tonight and have PT/INR within 1 week (at usual     lab)     REPEAT EXAM:  In 10 year(s) for routine screening colonocsopy.     change colonoscopy recall from 2015 to 2021           Iva Boop, MD, [facility]           CC:  Corwin Levins, MD     The Patient           n.     eSIGNED:   Iva Boop at 03/18/2010 02:16 PM           [place], [id number]  Note: An exclamation mark (!) indicates a result that was not dispersed into the flowsheet.  Document Creation Date: 03/18/2010 2:17 PM _______________________________________________________________________  (1) Order result status: Final Collection or observation date-time: 03/18/2010 14:09 Requested date-time:  Receipt date-time:  Reported date-time:  Referring Physician:   Ordering Physician: Stan Head [phone]) Specimen Source:  Source: Launa Grill Order Number: [id number] Lab site:

## 2011-01-05 NOTE — Letter (Signed)
Summary: Out of Work  [facility]  [address] [place], [place] [address]   Phone: [phone]  Fax: [phone]    January 16, 2010   Employee:  Malick T Devin    To Whom It May Concern:   For Medical reasons, please excuse the above named employee from work for the following dates:  Start:  01/14/2010    End:   01/19/2010  If you need additional information, please feel free to contact our office.         Sincerely,    Dr. Oliver Barre

## 2011-01-05 NOTE — Assessment & Plan Note (Signed)
Summary: follow up abdominal pain/sheri    History of Present Illness Visit Type: Follow-up Visit Primary GI MD: Stan Head MD [facility] Primary Provider: Oliver Barre, MD Requesting Provider: Oliver Barre, MD Chief Complaint: abdoinal pain History of Present Illness:   55 yo African-American man with upper abdominal pain and changes in bowels, with more constipation problems. We have seen him more than once in past 1-2 months. He was thought to be dehydrated on one occasion and has seen Dr. Jonny Ruiz with adjustment of anti-hypertensives. His problems are essentially still the same with fairly constant upper abdominal pain or discomfort exacerbated by bending which he has to do at work. He still struggles to defecate at times but does empty out with multiple bowel movements at times. He has frequent nausea, intensified by the pain. He has multiple aches and pains including back and knees and generally feels bad much of the time. It is difficult for him to work due to these problems. He recently started phentermine ijn an attempt to lose weight and realizes his obesity is a problem in many ways.           Preventive Screening-Counseling & Management  Caffeine-Diet-Exercise     Diet Counseling: to improve diet; diet is suboptimal     Nutrition Referrals: yes  Comments: discussd the bariatric clinic and seminar program    Current Medications (verified): 1)  Actos 45 Mg Tabs (Pioglitazone Hcl) .Marland Kitchen.. 1 Tablet By Mouth Once A Day 2)  Aurora Lancet Super Thin 30g  Misc (Lancets) .... Use 1 Stick As Directed Three Times A Day 3)  Simvastatin 80 Mg  Tabs (Simvastatin) .Marland Kitchen.. 1 By Mouth Once Daily 4)  Onetouch Ultra Test  Strp (Glucose Blood) .... Use 1 Strip Three Times A Day 5)  Zetia 10 Mg Tabs (Ezetimibe) .... Take 1 Tablet By Mouth Once A Day 6)  Glipizide 10 Mg Tabs (Glipizide) .Marland Kitchen.. 1 By Mouth Two Times A Day 7)  Lisinopril 5 Mg Tabs (Lisinopril) .Marland Kitchen.. 1 By Mouth Once Daily 8)  Ecotrin Low  Strength 81 Mg  Tbec (Aspirin) .Marland Kitchen.. 1 By Mouth Qd 9)  Hydrochlorothiazide 25 Mg Tabs (Hydrochlorothiazide) .Marland Kitchen.. 1po Once Daily 10)  Tramadol Hcl 50 Mg Tabs (Tramadol Hcl) .Marland Kitchen.. 1 - 2 By Mouth Four Times Per Day As Needed For Pain 11)  Aciphex 20 Mg Tbec (Rabeprazole Sodium) .Marland Kitchen.. 1 By Mouth Once Daily 12)  Coumadin 5 Mg Tabs (Warfarin Sodium) .... Take As Directed By Coumadin Clinic. 13)  Proair Hfa 108 (90 Base) Mcg/act Aers (Albuterol Sulfate) .... 2 Puffs Four Times Per Day As Needed For Shortness of Breath 14)  Flexeril 5 Mg Tabs (Cyclobenzaprine Hcl) .Marland Kitchen.. 1 By Mouth Three Times A Day As Needed 15)  Promethazine Hcl 25 Mg Tabs (Promethazine Hcl) .Marland Kitchen.. 1po Q 6 Hrs As Needed Nausea 16)  Miralax  Powd (Polyethylene Glycol 3350) .... Mix 1 Capful (17 Grams) in 8 Oz Water Daily 17)  Phillips Milk of Magnesia 7.75 % Susp (Magnesium Hydroxide) .... Take Capful As Needed 18)  Cymbalta 60 Mg Cpep (Duloxetine Hcl) .Marland Kitchen.. 1 By Mouth Once Daily 19)  Phentermine Hcl 37.5 Mg Caps (Phentermine Hcl) .Marland Kitchen.. 1po Once Daily  Allergies (verified): No Known Drug Allergies  Past History:  Past Medical History: Reviewed history from 01/21/2010 and no changes required. Diabetes mellitus, type II Hyperlipidemia Hypertension kidney stones,knee surgery,ankel surgery,bone spurs,torn ligaent Obstructive Sleep Apnea DVT- R Lower Extremity Pulmonary Embolus Diverticulosis, colon Depression Renal insufficiency Colonic polyps, hx of  hypogonadism Obesity  Past Surgical History: Knee Surgery- Apr 27, 1999 & 04/26/02, cartilage damage Tonsillectomy and Adenoidectomy Ankle Surgery with bone spurs and torn tendon- Apr 26, 2000 bilateral  COLONOSCOPY 2005-DIVERTICULOSIS/HYPERPLASTIC POLYPS-DUE FOR F/U Apr 26, 2014 EGD 2008-CHRONIC DUODENITIS  Family History: Reviewed history from 01/21/2010 and no changes required. father with prostate cancer mother with HTN sister died with DM 04-26-2001 brother and 2 more sisters with DM No FH of Colon  Cancer:  Social History: Reviewed history from 01/21/2010 and no changes required. Former Smoker Alcohol use-no Divorced 2 children work - back to work at [employer] since jan 2010 Daily Caffeine Use one per day  Review of Systems       The patient complains of arthritis/joint pain, back pain, fatigue, night sweats, nosebleeds, shortness of breath, skin rash, sleeping problems, thirst - excessive, and urination - excessive.    Vital Signs:  Patient profile:   55 year old male Height:      74 inches Weight:      374.50 pounds BMI:     48.26 Pulse rate:   72 / minute Pulse rhythm:   regular BP sitting:   102 / 78  (left arm) Cuff size:   large  Vitals Entered By: June McMurray CMA Duncan Dull) (March 02, 2010 9:59 AM)  Physical Exam  General:  alert  obese.   Eyes:  anicteric Lungs:  Clear throughout to auscultation. Heart:  Regular rate and rhythm; no murmurs, rubs,  or bruits. Abdomen:  obese, soft, mildly tender epigastrium no mass Rectal:  deferred until time of colonoscopy.   Psych:  mildly flat affect   Impression & Recommendations:  Problem # 1:  ABDOMINAL PAIN, UPPER (ICD-789.09) Assessment Unchanged This seems musculoskeletal to me, perhaps from bending. Howevere, with irregular and changing bowels, will assess colon. Prior CT did not reveal a cause. Since UGI tract pathology is alos possible and he will need to be off warfarin, will also evaluate with EGD. Risks, benefits,and indications of endoscopic procedure(s) were reviewed with the patient and all questions answered. Included risk of blood clot off warfarin. try dicyclomine for possible spasm and functional GI component to pain  Problem # 2:  CHANGE IN BOWELS (YQI-347.42) Assessment: Unchanged will need colonoscopy evaluation. Needs to be at hospital due to weight and BMI risks. Needs to be off warfarin. Will see if ok to hold warfarin 3 days or if he needs Lovenox window. Risks, benefits,and  indications of endoscopic procedure(s) were reviewed with the patient and all questions answered. Disscussed risk of blood clot off warfarin also.  Problem # 3:  OBESITY (ICD-278.00) Assessment: Comment Only phentermine begun we discussed that weight loss would do more to help his health than anything else and that surgery is an effective option his risk profile for surgery is higher but so is benefit potential we will give him the # to bariatric clinic  Patient Instructions: 1)  Please pick up your medications at your pharmacy. DICYCLOMINE WAS SENT TO [facility]. 2)  Bariatric Seminar [phone].  You must attend an information session before you can schedule with a surgeon. 3)  We will call you after we have heard from Dr. Jonny Ruiz to schedule your procedures.  If you have not heard from [place] by 03/11/10, please call our office. 4)  Copy sent to : Oliver Barre, MD 5)  The medication list was reviewed and reconciled.  All changed / newly prescribed medications were explained.  A complete medication list was provided to the patient / caregiver.  Prescriptions: DICYCLOMINE HCL 20 MG TABS (DICYCLOMINE HCL) 1 by mouth every 6 hrs as needed for abdominal pain  #60 x 0   Entered and Authorized by:   Iva Boop MD, [facility]   Signed by:   Iva Boop MD, [facility] on 03/02/2010   Method used:   Electronically to        [facility] (retail)       [address]       [place], [place]  [address]       Ph: [phone]       Fax: [phone]   RxID:   [id number]

## 2011-01-05 NOTE — Assessment & Plan Note (Signed)
Summary: FU  STC   Vital Signs:  Patient profile:   55 year old male Height:      74 inches Weight:      381.50 pounds BMI:     49.16 O2 Sat:      93 % on Room air Temp:     99 degrees F oral Pulse rate:   67 / minute BP sitting:   118 / 82  (left arm) Cuff size:   large  Vitals Entered ByZella Ball Ewing (March 31, 2010 9:35 AM)  O2 Flow:  Room air CC: 1 Month Followup/RE   Primary Care Provider:  Oliver Barre, MD  CC:  1 Month Followup/RE.  History of Present Illness: unable to afford the actos recetnly and sugars running in the 200;s occasionally;  same for the simvastatin - requests refills today;  Pt denies CP, sob, doe, wheezing, orthopnea, pnd, worsening LE edema, palps, dizziness or syncope  Pt denies new neuro symptoms such as headache, facial or extremity weakness  Pt denies polydipsia, polyuria, or low sugar symptoms such as shakiness improved with eating.  Overall good compliance with meds, trying to follow low chol, DM diet, wt stable, little excercise however  Abd pains persist, even despite not taking the statin for 2 to 3 wks.  Last time worked approx apr 1;  has paperwork regarding out of work.  has gastric biopsy pending regardin h pylorii.  Tried to mow the yard at home, but abd pains returned and had to stop.   Not yet started the diet pill.  gained 3 lbs from last visit. Has occas nausea, but no vomiting,  using miralax adn stool softner  but has to go off and on depending on consistency of of stool - has to hold off when the stool is too loose.  Trying to avoid greens due to coumadin.  He's not sure if the dicyclomine helps.   Takes the aciphex.    also with pain off and on to the right lower back, without radiation, other change in bowels or bladder,no radicaular symtpoms or LE pain, weakness, or numbness.,falls or injury.   No fever, night seats, wt loss or other constitutional symptoms.    Problems Prior to Update: 1)  Diab w/o Mention Comp Type Ii/uns Type  Uncntrl  (ICD-250.02) 2)  Change in Bowels  (ICD-787.99) 3)  Dehydration  (ICD-276.51) 4)  Hypotension, Orthostatic  (ICD-458.0) 5)  Constipation  (ICD-564.00) 6)  Coumadin Therapy  (ICD-V58.61) 7)  Obesity  (ICD-278.00) 8)  Abdominal Pain-llq  (ICD-789.04) 9)  Abdominal Pain, Upper  (ICD-789.09) 10)  Back Pain  (ICD-724.5) 11)  Degenerative Joint Disease, Knees, Bilateral  (ICD-715.96) 12)  Wheezing  (ICD-786.07) 13)  Smoker  (ICD-305.1) 14)  Wheezing  (ICD-786.07) 15)  Leg Pain, Left  (ICD-729.5) 16)  Obesity, Morbid  (ICD-278.01) 17)  Hypogonadism, Male  (ICD-257.2) 18)  Uti  (ICD-599.0) 19)  Colonic Polyps, Hx of  (ICD-V12.72) 20)  Renal Insufficiency  (ICD-588.9) 21)  Depression  (ICD-311) 22)  Diverticulosis, Colon  (ICD-562.10) 23)  Hypertension  (ICD-401.9) 24)  Hyperlipidemia  (ICD-272.4) 25)  Diabetes Mellitus, Type II  (ICD-250.00)  Medications Prior to Update: 1)  Actos 45 Mg Tabs (Pioglitazone Hcl) .Marland Kitchen.. 1 Tablet By Mouth Once A Day 2)  Aurora Lancet Super Thin 30g  Misc (Lancets) .... Use 1 Stick As Directed Three Times A Day 3)  Simvastatin 80 Mg  Tabs (Simvastatin) .Marland Kitchen.. 1 By Mouth Once Daily 4)  Onetouch  Ultra Test  Strp (Glucose Blood) .... Use 1 Strip Three Times A Day 5)  Zetia 10 Mg Tabs (Ezetimibe) .... Take 1 Tablet By Mouth Once A Day 6)  Glipizide 10 Mg Tabs (Glipizide) .Marland Kitchen.. 1 By Mouth Two Times A Day 7)  Lisinopril 5 Mg Tabs (Lisinopril) .Marland Kitchen.. 1 By Mouth Once Daily 8)  Ecotrin Low Strength 81 Mg  Tbec (Aspirin) .Marland Kitchen.. 1 By Mouth Qd 9)  Hydrochlorothiazide 25 Mg Tabs (Hydrochlorothiazide) .Marland Kitchen.. 1po Once Daily 10)  Tramadol Hcl 50 Mg Tabs (Tramadol Hcl) .Marland Kitchen.. 1 - 2 By Mouth Four Times Per Day As Needed For Pain 11)  Aciphex 20 Mg Tbec (Rabeprazole Sodium) .Marland Kitchen.. 1 By Mouth Once Daily 12)  Coumadin 5 Mg Tabs (Warfarin Sodium) .... Take As Directed By Coumadin Clinic. 13)  Proair Hfa 108 (90 Base) Mcg/act Aers (Albuterol Sulfate) .... 2 Puffs Four Times Per Day As  Needed For Shortness of Breath 14)  Flexeril 5 Mg Tabs (Cyclobenzaprine Hcl) .Marland Kitchen.. 1 By Mouth Three Times A Day As Needed 15)  Promethazine Hcl 25 Mg Tabs (Promethazine Hcl) .Marland Kitchen.. 1po Q 6 Hrs As Needed Nausea 16)  Miralax  Powd (Polyethylene Glycol 3350) .... Mix 1 Capful (17 Grams) in 8 Oz Water Daily 17)  Phillips Milk of Magnesia 7.75 % Susp (Magnesium Hydroxide) .... Take Capful As Needed 18)  Cymbalta 60 Mg Cpep (Duloxetine Hcl) .Marland Kitchen.. 1 By Mouth Once Daily 19)  Phentermine Hcl 37.5 Mg Caps (Phentermine Hcl) .Marland Kitchen.. 1po Once Daily 20)  Dicyclomine Hcl 20 Mg Tabs (Dicyclomine Hcl) .Marland Kitchen.. 1 By Mouth Every 6 Hrs As Needed For Abdominal Pain 21)  Enoxaparin Sodium 150 Mg/ml Soln (Enoxaparin Sodium) .Marland Kitchen.. 1 Cc Subcutaneously Two Times A Day 22)  Moviprep 100 Gm  Solr (Peg-Kcl-Nacl-Nasulf-Na Asc-C) .... As Per Prep Instructions.  Current Medications (verified): 1)  Actos 45 Mg Tabs (Pioglitazone Hcl) .Marland Kitchen.. 1 Tablet By Mouth Once A Day 2)  Aurora Lancet Super Thin 30g  Misc (Lancets) .... Use 1 Stick As Directed Three Times A Day 3)  Simvastatin 80 Mg  Tabs (Simvastatin) .Marland Kitchen.. 1 By Mouth Once Daily 4)  Onetouch Ultra Test  Strp (Glucose Blood) .... Use 1 Strip Three Times A Day 5)  Zetia 10 Mg Tabs (Ezetimibe) .... Take 1 Tablet By Mouth Once A Day 6)  Glipizide 10 Mg Tabs (Glipizide) .Marland Kitchen.. 1 By Mouth Two Times A Day 7)  Lisinopril 5 Mg Tabs (Lisinopril) .Marland Kitchen.. 1 By Mouth Once Daily 8)  Ecotrin Low Strength 81 Mg  Tbec (Aspirin) .Marland Kitchen.. 1 By Mouth Qd 9)  Hydrochlorothiazide 25 Mg Tabs (Hydrochlorothiazide) .Marland Kitchen.. 1po Once Daily 10)  Tramadol Hcl 50 Mg Tabs (Tramadol Hcl) .Marland Kitchen.. 1 - 2 By Mouth Four Times Per Day As Needed For Pain 11)  Aciphex 20 Mg Tbec (Rabeprazole Sodium) .Marland Kitchen.. 1 By Mouth Once Daily 12)  Coumadin 5 Mg Tabs (Warfarin Sodium) .... Take As Directed By Coumadin Clinic. 13)  Proair Hfa 108 (90 Base) Mcg/act Aers (Albuterol Sulfate) .... 2 Puffs Four Times Per Day As Needed For Shortness of Breath 14)   Flexeril 5 Mg Tabs (Cyclobenzaprine Hcl) .Marland Kitchen.. 1 By Mouth Three Times A Day As Needed 15)  Promethazine Hcl 25 Mg Tabs (Promethazine Hcl) .Marland Kitchen.. 1po Q 6 Hrs As Needed Nausea 16)  Miralax  Powd (Polyethylene Glycol 3350) .... Mix 1 Capful (17 Grams) in 8 Oz Water Daily 17)  Phillips Milk of Magnesia 7.75 % Susp (Magnesium Hydroxide) .... Take Capful As Needed 18)  Cymbalta 60 Mg Cpep (Duloxetine Hcl) .Marland Kitchen.. 1 By Mouth Once Daily 19)  Phentermine Hcl 37.5 Mg Caps (Phentermine Hcl) .Marland Kitchen.. 1po Once Daily 20)  Dicyclomine Hcl 20 Mg Tabs (Dicyclomine Hcl) .Marland Kitchen.. 1 By Mouth Every 6 Hrs As Needed For Abdominal Pain 21)  Enoxaparin Sodium 150 Mg/ml Soln (Enoxaparin Sodium) .Marland Kitchen.. 1 Cc Subcutaneously Two Times A Day 22)  Moviprep 100 Gm  Solr (Peg-Kcl-Nacl-Nasulf-Na Asc-C) .... As Per Prep Instructions.  Allergies (verified): No Known Drug Allergies  Past History:  Past Medical History: Last updated: 01/21/2010 Diabetes mellitus, type II Hyperlipidemia Hypertension kidney stones,knee surgery,ankel surgery,bone spurs,torn ligaent Obstructive Sleep Apnea DVT- R Lower Extremity Pulmonary Embolus Diverticulosis, colon Depression Renal insufficiency Colonic polyps, hx of hypogonadism Obesity  Past Surgical History: Last updated: 03/02/2010 Knee Surgery- 2000 & 2003, cartilage damage Tonsillectomy and Adenoidectomy Ankle Surgery with bone spurs and torn tendon- 2001 bilateral  COLONOSCOPY 2005-DIVERTICULOSIS/HYPERPLASTIC POLYPS-DUE FOR F/U 2015 EGD 2008-CHRONIC DUODENITIS  Social History: Last updated: 01/21/2010 Former Smoker Alcohol use-no Divorced 2 children work - back to work at [employer] since jan 2010 Daily Caffeine Use one per day  Risk Factors: Smoking Status: quit (10/05/2007)  Review of Systems       all otherwise negative per pt -    Physical Exam  General:  alert and overweight-appearing.   Head:  normocephalic and atraumatic.   Eyes:  vision grossly intact,  pupils equal, and pupils round.   Ears:  R ear normal and L ear normal.   Nose:  no external deformity and no nasal discharge.   Mouth:  no gingival abnormalities and pharynx pink and moist.   Neck:  supple and no masses.   Lungs:  normal respiratory effort and normal breath sounds.   Heart:  normal rate and regular rhythm.   Abdomen:  soft, non-tender, and normal bowel sounds.   Msk:  no joint tenderness and no joint swelling.  , spine non tender throughout Extremities:  no edema, no erythema  Neurologic:  cranial nerves II-XII intact, strength normal in all extremities, sensation intact to light touch, and gait normal.     Impression & Recommendations:  Problem # 1:  ABDOMINAL PAIN, UPPER (ICD-789.09)  His updated medication list for this problem includes:    Ecotrin Low Strength 81 Mg Tbec (Aspirin) .Marland Kitchen... 1 by mouth qd    Tramadol Hcl 50 Mg Tabs (Tramadol hcl) .Marland Kitchen... 1 - 2 by mouth four times per day as needed for pain    Flexeril 5 Mg Tabs (Cyclobenzaprine hcl) .Marland Kitchen... 1 by mouth three times a day as needed CT and endoscopies essentially neg, with exertional component to pain , ? msk vs intestinal angina ;  has mult risk factors for vascular insuff  - will check imaging study  Orders: Radiology Referral (Radiology)  Problem # 2:  BACK PAIN (ICD-724.5)  His updated medication list for this problem includes:    Ecotrin Low Strength 81 Mg Tbec (Aspirin) .Marland Kitchen... 1 by mouth qd    Tramadol Hcl 50 Mg Tabs (Tramadol hcl) .Marland Kitchen... 1 - 2 by mouth four times per day as needed for pain    Flexeril 5 Mg Tabs (Cyclobenzaprine hcl) .Marland Kitchen... 1 by mouth three times a day as needed right lateral - c/w msk most likely - ok to follow for now, exam benign  Problem # 3:  HYPERTENSION (ICD-401.9)  His updated medication list for this problem includes:    Lisinopril 5 Mg Tabs (Lisinopril) .Marland Kitchen... 1 by mouth  once daily    Hydrochlorothiazide 25 Mg Tabs (Hydrochlorothiazide) .Marland Kitchen... 1po once daily  BP today:  118/82 Prior BP: 102/78 (03/02/2010)  Labs Reviewed: K+: 3.7 (03/24/2010) Creat: : 1.1 (03/24/2010)   Chol: 253 (03/24/2010)   HDL: 40.20 (03/24/2010)   LDL: 79 (09/11/2009)   TG: 182.0 (03/24/2010) stable overall by hx and exam, ok to continue meds/tx as is   Complete Medication List: 1)  Actos 45 Mg Tabs (Pioglitazone hcl) .Marland Kitchen.. 1 tablet by mouth once a day 2)  Aurora Lancet Super Thin 30g Misc (Lancets) .... Use 1 stick as directed three times a day 3)  Simvastatin 80 Mg Tabs (Simvastatin) .Marland Kitchen.. 1 by mouth once daily 4)  Onetouch Ultra Test Strp (Glucose blood) .... Use 1 strip three times a day 5)  Zetia 10 Mg Tabs (Ezetimibe) .... Take 1 tablet by mouth once a day 6)  Glipizide 10 Mg Tabs (Glipizide) .Marland Kitchen.. 1 by mouth two times a day 7)  Lisinopril 5 Mg Tabs (Lisinopril) .Marland Kitchen.. 1 by mouth once daily 8)  Ecotrin Low Strength 81 Mg Tbec (Aspirin) .Marland Kitchen.. 1 by mouth qd 9)  Hydrochlorothiazide 25 Mg Tabs (Hydrochlorothiazide) .Marland Kitchen.. 1po once daily 10)  Tramadol Hcl 50 Mg Tabs (Tramadol hcl) .Marland Kitchen.. 1 - 2 by mouth four times per day as needed for pain 11)  Aciphex 20 Mg Tbec (Rabeprazole sodium) .Marland Kitchen.. 1 by mouth once daily 12)  Coumadin 5 Mg Tabs (Warfarin sodium) .... Take as directed by coumadin clinic. 13)  Proair Hfa 108 (90 Base) Mcg/act Aers (Albuterol sulfate) .... 2 puffs four times per day as needed for shortness of breath 14)  Flexeril 5 Mg Tabs (Cyclobenzaprine hcl) .Marland Kitchen.. 1 by mouth three times a day as needed 15)  Promethazine Hcl 25 Mg Tabs (Promethazine hcl) .Marland Kitchen.. 1po q 6 hrs as needed nausea 16)  Miralax Powd (Polyethylene glycol 3350) .... Mix 1 capful (17 grams) in 8 oz water daily 17)  Phillips Milk of Magnesia 7.75 % Susp (Magnesium hydroxide) .... Take capful as needed 18)  Cymbalta 60 Mg Cpep (Duloxetine hcl) .Marland Kitchen.. 1 by mouth once daily 19)  Phentermine Hcl 37.5 Mg Caps (Phentermine hcl) .Marland Kitchen.. 1po once daily 20)  Dicyclomine Hcl 20 Mg Tabs (Dicyclomine hcl) .Marland Kitchen.. 1 by mouth every 6 hrs  as needed for abdominal pain  Patient Instructions: 1)  You will be contacted about the referral(s) to: imaging test for the circulation in the abd area 2)  Continue all previous medications as before this visit  3)  Please schedule a follow-up appointment in 2 weeks.

## 2011-01-05 NOTE — Medication Information (Signed)
Summary: rov/kb  Anticoagulant Therapy  Managed by: Bethena Midget, RN, BSN Referring MD: Nona Dell PCP: Oliver Barre, MD Supervising MD: Clifton James MD, Cristal Deer Indication 1: Pulmonary Embolism and Infarction (ICD-415.1) Lab Used: [facility] Site: [facility] INR POC 3.4 INR RANGE 2 - 3  Dietary changes: no    Health status changes: no    Bleeding/hemorrhagic complications: no    Recent/future hospitalizations: no    Any changes in medication regimen? no    Recent/future dental: no  Any missed doses?: no       Is patient compliant with meds? yes       Allergies: No Known Drug Allergies  Anticoagulation Management History:      The patient is taking warfarin and comes in today for a routine follow up visit.  Positive risk factors for bleeding include presence of serious comorbidities.  Negative risk factors for bleeding include an age less than 77 years old.  The bleeding index is 'intermediate risk'.  Positive CHADS2 values include History of HTN and History of Diabetes.  Negative CHADS2 values include Age > 45 years old.  The start date was 08/05/2005.  Anticoagulation responsible provider: Clifton James MD, Cristal Deer.  INR POC: 3.4.  Cuvette Lot#: [id number].  Exp: 07/2011.    Anticoagulation Management Assessment/Plan:      The patient's current anticoagulation dose is Coumadin 5 mg tabs: Take as directed by coumadin clinic..  The target INR is 2 - 3.  The next INR is due 05/29/2010.  Anticoagulation instructions were given to patient.  Results were reviewed/authorized by Bethena Midget, RN, BSN.  He was notified by Bethena Midget, RN, BSN.         Prior Anticoagulation Instructions: INR-2.6 Resume normal dosing schedule. Take 2 tablets daily.  Return in 2 weeks.   Current Anticoagulation Instructions: INR 3.4 Today take 1 pill then change dose to 2 pills everyday except 1 1/2 pills on Sundays. Recheck in 2 weeks.

## 2011-01-05 NOTE — Assessment & Plan Note (Signed)
Summary: FU---MEDS---STC   Vital Signs:  Patient profile:   55 year old male Height:      74 inches Weight:      394 pounds BMI:     50.77 O2 Sat:      92 % on Room air Temp:     97.3 degrees F oral Pulse rate:   76 / minute BP sitting:   100 / 68  (left arm) Cuff size:   large  Vitals Entered ByZella Ball Ewing (February 13, 2010 8:12 AM)  O2 Flow:  Room air CC: followup/RE   Primary Care Provider:  Oliver Barre, MD  CC:  followup/RE.  History of Present Illness: due to elev BP and nosebleed, he has been back to taking his meds as before, usually takes them at 8 pm since he works second shift;  nosebleeds resolved and no other sinus symtpom, but now lightheaded again;  and swelling to the LE has recurred .  Pt denies CP, sob, doe, wheezing, orthopnea, pnd, palps, or syncope Pt denies new neuro symptoms such as headache, facial or extremity weakness  Needs work note to go back to work.  Pt denies polydipsia, polyuria, or low sugar symptoms such as shakiness improved with eating.  Overall good compliance with meds, trying to follow low chol, DM diet, wt stable, little excercise however   Problems Prior to Update: 1)  Dehydration  (ICD-276.51) 2)  Hypotension, Orthostatic  (ICD-458.0) 3)  Constipation  (ICD-564.00) 4)  Coumadin Therapy  (ICD-V58.61) 5)  Obesity  (ICD-278.00) 6)  Abdominal Pain-llq  (ICD-789.04) 7)  Abdominal Pain, Generalized  (ICD-789.07) 8)  Back Pain  (ICD-724.5) 9)  Degenerative Joint Disease, Knees, Bilateral  (ICD-715.96) 10)  Wheezing  (ICD-786.07) 11)  Smoker  (ICD-305.1) 12)  Wheezing  (ICD-786.07) 13)  Leg Pain, Left  (ICD-729.5) 14)  Obesity, Morbid  (ICD-278.01) 15)  Hypogonadism, Male  (ICD-257.2) 16)  Uti  (ICD-599.0) 17)  Colonic Polyps, Hx of  (ICD-V12.72) 18)  Renal Insufficiency  (ICD-588.9) 19)  Depression  (ICD-311) 20)  Diverticulosis, Colon  (ICD-562.10) 21)  Hypertension  (ICD-401.9) 22)  Hyperlipidemia  (ICD-272.4) 23)  Diabetes  Mellitus, Type II  (ICD-250.00)  Medications Prior to Update: 1)  Actos 45 Mg Tabs (Pioglitazone Hcl) .Marland Kitchen.. 1 Tablet By Mouth Once A Day 2)  Aurora Lancet Super Thin 30g  Misc (Lancets) .... Use 1 Stick As Directed Three Times A Day 3)  Simvastatin 80 Mg  Tabs (Simvastatin) .Marland Kitchen.. 1 By Mouth Once Daily 4)  Onetouch Ultra Test  Strp (Glucose Blood) .... Use 1 Strip Three Times A Day 5)  Zetia 10 Mg Tabs (Ezetimibe) .... Take 1 Tablet By Mouth Once A Day 6)  Glipizide 10 Mg Tabs (Glipizide) .Marland Kitchen.. 1 By Mouth Two Times A Day 7)  Lisinopril 10 Mg  Tabs (Lisinopril) .Marland Kitchen.. 1 By Mouth Once Daily--Hold 8)  Ecotrin Low Strength 81 Mg  Tbec (Aspirin) .Marland Kitchen.. 1 By Mouth Qd 9)  Spironolactone-Hctz 25-25 Mg  Tabs (Spironolactone-Hctz) .Marland Kitchen.. 1 By Mouth Once Daily--Hold 10)  Tramadol Hcl 50 Mg Tabs (Tramadol Hcl) .Marland Kitchen.. 1 - 2 By Mouth Four Times Per Day As Needed For Pain 11)  Aciphex 20 Mg Tbec (Rabeprazole Sodium) .Marland Kitchen.. 1 By Mouth Once Daily 12)  Coumadin 5 Mg Tabs (Warfarin Sodium) .... Take As Directed By Coumadin Clinic. 13)  Proair Hfa 108 (90 Base) Mcg/act Aers (Albuterol Sulfate) .... 2 Puffs Four Times Per Day As Needed For Shortness of Breath 14)  Oxycodone  Hcl 5 Mg Caps (Oxycodone Hcl) .Marland Kitchen.. 1 - 3 By Mouth Q 6 Hrs As Needed Pain 15)  Flexeril 5 Mg Tabs (Cyclobenzaprine Hcl) .Marland Kitchen.. 1 By Mouth Three Times A Day As Needed 16)  Tramadol Hcl 50 Mg Tabs (Tramadol Hcl) .... Take 1 Tab Dailyprn 17)  Promethazine Hcl 25 Mg Tabs (Promethazine Hcl) .Marland Kitchen.. 1po Q 6 Hrs As Needed Nausea 18)  Miralax  Powd (Polyethylene Glycol 3350) .... Mix 1 Capful (17 Grams) in 8 Oz Water Daily 19)  Phillips Milk of Magnesia 7.75 % Susp (Magnesium Hydroxide) .... Take Capful As Needed  Current Medications (verified): 1)  Actos 45 Mg Tabs (Pioglitazone Hcl) .Marland Kitchen.. 1 Tablet By Mouth Once A Day 2)  Aurora Lancet Super Thin 30g  Misc (Lancets) .... Use 1 Stick As Directed Three Times A Day 3)  Simvastatin 80 Mg  Tabs (Simvastatin) .Marland Kitchen.. 1 By Mouth  Once Daily 4)  Onetouch Ultra Test  Strp (Glucose Blood) .... Use 1 Strip Three Times A Day 5)  Zetia 10 Mg Tabs (Ezetimibe) .... Take 1 Tablet By Mouth Once A Day 6)  Glipizide 10 Mg Tabs (Glipizide) .Marland Kitchen.. 1 By Mouth Two Times A Day 7)  Lisinopril 10 Mg  Tabs (Lisinopril) .Marland Kitchen.. 1 By Mouth Once Daily--Hold 8)  Ecotrin Low Strength 81 Mg  Tbec (Aspirin) .Marland Kitchen.. 1 By Mouth Qd 9)  Spironolactone-Hctz 25-25 Mg  Tabs (Spironolactone-Hctz) .Marland Kitchen.. 1 By Mouth Once Daily--Hold 10)  Tramadol Hcl 50 Mg Tabs (Tramadol Hcl) .Marland Kitchen.. 1 - 2 By Mouth Four Times Per Day As Needed For Pain 11)  Aciphex 20 Mg Tbec (Rabeprazole Sodium) .Marland Kitchen.. 1 By Mouth Once Daily 12)  Coumadin 5 Mg Tabs (Warfarin Sodium) .... Take As Directed By Coumadin Clinic. 13)  Proair Hfa 108 (90 Base) Mcg/act Aers (Albuterol Sulfate) .... 2 Puffs Four Times Per Day As Needed For Shortness of Breath 14)  Oxycodone Hcl 5 Mg Caps (Oxycodone Hcl) .Marland Kitchen.. 1 - 3 By Mouth Q 6 Hrs As Needed Pain 15)  Flexeril 5 Mg Tabs (Cyclobenzaprine Hcl) .Marland Kitchen.. 1 By Mouth Three Times A Day As Needed 16)  Tramadol Hcl 50 Mg Tabs (Tramadol Hcl) .... Take 1 Tab Dailyprn 17)  Promethazine Hcl 25 Mg Tabs (Promethazine Hcl) .Marland Kitchen.. 1po Q 6 Hrs As Needed Nausea 18)  Miralax  Powd (Polyethylene Glycol 3350) .... Mix 1 Capful (17 Grams) in 8 Oz Water Daily 19)  Phillips Milk of Magnesia 7.75 % Susp (Magnesium Hydroxide) .... Take Capful As Needed  Allergies (verified): No Known Drug Allergies  Past History:  Past Medical History: Last updated: 01/21/2010 Diabetes mellitus, type II Hyperlipidemia Hypertension kidney stones,knee surgery,ankel surgery,bone spurs,torn ligaent Obstructive Sleep Apnea DVT- R Lower Extremity Pulmonary Embolus Diverticulosis, colon Depression Renal insufficiency Colonic polyps, hx of hypogonadism Obesity  Past Surgical History: Last updated: 01/21/2010 Knee Surgery- 2000 & 2003, cartilage damage Tonsillectomy and Adenoidectomy Ankle Surgery  with bone spurs and torn tendon- 2001 bilateral  COLONOSCOPY 2005-DIVERTICULOSIS/HYPERPLASTIS POLYPS-DUE FOR F/U 2015 EGD 2008-CHRONIC DUODENITIS  Social History: Last updated: 01/21/2010 Former Smoker Alcohol use-no Divorced 2 children work - back to work at [employer] since jan 2010 Daily Caffeine Use one per day  Risk Factors: Smoking Status: quit (10/05/2007)  Review of Systems       all otherwise negative per pt -   Physical Exam  General:  alert and overweight-appearing.   Head:  normocephalic and atraumatic.   Eyes:  vision grossly intact and pupils equal.   Ears:  R ear normal and L ear normal.  , sinus nontender Nose:  no external deformity.   Mouth:  no gingival abnormalities and pharynx pink and moist.   Neck:  supple and no masses.   Lungs:  normal respiratory effort and normal breath sounds.   Heart:  normal rate and regular rhythm.   Extremities:  trace to 1+ edema  bilat, left more than right   Impression & Recommendations:  Problem # 1:  HYPERTENSION (ICD-401.9)  His updated medication list for this problem includes:    Lisinopril 5 Mg Tabs (Lisinopril) .Marland Kitchen... 1 by mouth once daily    Hydrochlorothiazide 25 Mg Tabs (Hydrochlorothiazide) .Marland Kitchen... 1po once daily overcontrolled again as he is back on his previous meds without returning for f/u as requested;  it appears he has recureent LE edema without orthostasis at this time, and also would be ideal to have at least low dose ACe for renal proteciton given the dm - will adjust meds as above; f/u OV 2 wk.,  ok for note to return to work  Problem # 2:  HYPOTENSION, ORTHOSTATIC (ICD-458.0) resolved, as above  Problem # 3:  DIABETES MELLITUS, TYPE II (ICD-250.00)  His updated medication list for this problem includes:    Actos 45 Mg Tabs (Pioglitazone hcl) .Marland Kitchen... 1 tablet by mouth once a day    Glipizide 10 Mg Tabs (Glipizide) .Marland Kitchen... 1 by mouth two times a day    Lisinopril 5 Mg Tabs (Lisinopril)  .Marland Kitchen... 1 by mouth once daily    Ecotrin Low Strength 81 Mg Tbec (Aspirin) .Marland Kitchen... 1 by mouth qd  Labs Reviewed: Creat: 1.3 (01/29/2010)    Reviewed HgBA1c results: 9.8 (01/12/2010)  6.7 (09/11/2009) stable overall by hx and exam, ok to continue meds/tx as is   Problem # 4:  RENAL INSUFFICIENCY (ICD-588.9) recent stable; f/u next visit  Complete Medication List: 1)  Actos 45 Mg Tabs (Pioglitazone hcl) .Marland Kitchen.. 1 tablet by mouth once a day 2)  Aurora Lancet Super Thin 30g Misc (Lancets) .... Use 1 stick as directed three times a day 3)  Simvastatin 80 Mg Tabs (Simvastatin) .Marland Kitchen.. 1 by mouth once daily 4)  Onetouch Ultra Test Strp (Glucose blood) .... Use 1 strip three times a day 5)  Zetia 10 Mg Tabs (Ezetimibe) .... Take 1 tablet by mouth once a day 6)  Glipizide 10 Mg Tabs (Glipizide) .Marland Kitchen.. 1 by mouth two times a day 7)  Lisinopril 5 Mg Tabs (Lisinopril) .Marland Kitchen.. 1 by mouth once daily 8)  Ecotrin Low Strength 81 Mg Tbec (Aspirin) .Marland Kitchen.. 1 by mouth qd 9)  Hydrochlorothiazide 25 Mg Tabs (Hydrochlorothiazide) .Marland Kitchen.. 1po once daily 10)  Tramadol Hcl 50 Mg Tabs (Tramadol hcl) .Marland Kitchen.. 1 - 2 by mouth four times per day as needed for pain 11)  Aciphex 20 Mg Tbec (Rabeprazole sodium) .Marland Kitchen.. 1 by mouth once daily 12)  Coumadin 5 Mg Tabs (Warfarin sodium) .... Take as directed by coumadin clinic. 13)  Proair Hfa 108 (90 Base) Mcg/act Aers (Albuterol sulfate) .... 2 puffs four times per day as needed for shortness of breath 14)  Oxycodone Hcl 5 Mg Caps (Oxycodone hcl) .Marland Kitchen.. 1 - 3 by mouth q 6 hrs as needed pain 15)  Flexeril 5 Mg Tabs (Cyclobenzaprine hcl) .Marland Kitchen.. 1 by mouth three times a day as needed 16)  Tramadol Hcl 50 Mg Tabs (Tramadol hcl) .... Take 1 tab dailyprn 17)  Promethazine Hcl 25 Mg Tabs (Promethazine hcl) .Marland Kitchen.. 1po q 6 hrs as needed  nausea 18)  Miralax Powd (Polyethylene glycol 3350) .... Mix 1 capful (17 grams) in 8 oz water daily 19)  Phillips Milk of Magnesia 7.75 % Susp (Magnesium hydroxide) .... Take  capful as needed  Patient Instructions: 1)  decrease the lisinopril to 5 mg per day 2)  decrase the fluid pill to HCTZ 25 mg only 3)  Continue all previous medications as before this visit  4)  Please schedule a follow-up appointment in 2 weeks. 5)  You are given the work note today 6)  Check your Blood Pressure regularly. If it is above 140/90 , or lower than 100 on the top number: you should  call or make an appointment. Prescriptions: HYDROCHLOROTHIAZIDE 25 MG TABS (HYDROCHLOROTHIAZIDE) 1po once daily  #90 x 3   Entered and Authorized by:   Corwin Levins MD   Signed by:   Corwin Levins MD on 02/13/2010   Method used:   Print then Give to Patient   RxID:   [id number] LISINOPRIL 5 MG TABS (LISINOPRIL) 1 by mouth once daily  #90 x 3   Entered and Authorized by:   Corwin Levins MD   Signed by:   Corwin Levins MD on 02/13/2010   Method used:   Print then Give to Patient   RxID:   [id number]

## 2011-01-05 NOTE — Progress Notes (Signed)
Summary: triage   Phone Note Call from Patient Call back at Home Phone [phone]   Caller: Patient Call For: Leone Payor Reason for Call: Talk to Nurse Summary of Call: Patient has severe stomach pain, wants to know if he can be seen sooner than 3-21 or what should he do Initial call taken by: Tawni Levy,  January 20, 2010 8:14 AM  Follow-up for Phone Call        patient c/o abdominal pain for the last two weeks.  He reports severe pain with meal, bloating .  Patient  has been out of work for 1 week per Dr Raphael Gibney office.  Patient  will come in and see Mike Gip PA 01-21-10 1:30 Follow-up by: Darcey Nora RN, CGRN,  January 20, 2010 8:35 AM

## 2011-01-05 NOTE — Progress Notes (Signed)
Summary: results request   Phone Note Call from Patient Call back at Home Phone [phone]   Caller: Patient Call For: Dr. Leone Payor Reason for Call: Talk to Nurse Summary of Call: would like biopsy results Initial call taken by: Vallarie Mare,  March 24, 2010 12:55 PM  Follow-up for Phone Call        Told  he will receive letter in the mail when report read by the doctor. Follow-up by: Teryl Lucy RN,  March 24, 2010 2:38 PM

## 2011-01-05 NOTE — Assessment & Plan Note (Signed)
Summary: F/U ABD pain, saw Amy Estserwood PA-C    History of Present Illness Visit Type: Follow-up Visit Primary GI MD: Stan Head MD [facility] Primary Provider: Oliver Barre, MD Requesting Provider: Oliver Barre, MD Chief Complaint: Abdominal pain,  lower  History of Present Illness:   Middle of January the pain started. Went to second shift and was bending and pulling alot on the job. Says he could hardly make it without passing out light headed)after standing up when bending over. Chronic nausea, anorexia and constipation. Feels bad every avery day. Citrate of magnesia produced defecation and with metronidazole was a little better but then regresed. very stressed over bills to [employer] and the new job never been on antidepressants is unable to work since middle of January due to symptoms tried again but could not   GI Review of Systems    Reports abdominal pain, belching, bloating, and  nausea.     Location of  Abdominal pain: LUQ.    Denies acid reflux, chest pain, dysphagia with liquids, dysphagia with solids, heartburn, loss of appetite, vomiting, vomiting blood, weight loss, and  weight gain.      Reports constipation.     Denies anal fissure, black tarry stools, change in bowel habit, diarrhea, diverticulosis, fecal incontinence, heme positive stool, hemorrhoids, irritable bowel syndrome, jaundice, light color stool, liver problems, rectal bleeding, and  rectal pain.    Current Medications (verified): 1)  Actos 45 Mg Tabs (Pioglitazone Hcl) .Marland Kitchen.. 1 Tablet By Mouth Once A Day 2)  Aurora Lancet Super Thin 30g  Misc (Lancets) .... Use 1 Stick As Directed Three Times A Day 3)  Simvastatin 80 Mg  Tabs (Simvastatin) .Marland Kitchen.. 1 By Mouth Once Daily 4)  Onetouch Ultra Test  Strp (Glucose Blood) .... Use 1 Strip Three Times A Day 5)  Zetia 10 Mg Tabs (Ezetimibe) .... Take 1 Tablet By Mouth Once A Day 6)  Glipizide 10 Mg Tabs (Glipizide) .Marland Kitchen.. 1 By Mouth Two Times A Day 7)  Lisinopril 10 Mg  Tabs  (Lisinopril) .Marland Kitchen.. 1 By Mouth Once Daily 8)  Ecotrin Low Strength 81 Mg  Tbec (Aspirin) .Marland Kitchen.. 1 By Mouth Qd 9)  Spironolactone-Hctz 25-25 Mg  Tabs (Spironolactone-Hctz) .Marland Kitchen.. 1 By Mouth Once Daily 10)  Tramadol Hcl 50 Mg Tabs (Tramadol Hcl) .Marland Kitchen.. 1 - 2 By Mouth Four Times Per Day As Needed For Pain 11)  Aciphex 20 Mg Tbec (Rabeprazole Sodium) .Marland Kitchen.. 1 By Mouth Once Daily 12)  Coumadin 5 Mg Tabs (Warfarin Sodium) .... Take As Directed By Coumadin Clinic. 13)  Proair Hfa 108 (90 Base) Mcg/act Aers (Albuterol Sulfate) .... 2 Puffs Four Times Per Day As Needed For Shortness of Breath 14)  Oxycodone Hcl 5 Mg Caps (Oxycodone Hcl) .Marland Kitchen.. 1 - 3 By Mouth Q 6 Hrs As Needed Pain 15)  Flexeril 5 Mg Tabs (Cyclobenzaprine Hcl) .Marland Kitchen.. 1 By Mouth Three Times A Day As Needed 16)  Tramadol Hcl 50 Mg Tabs (Tramadol Hcl) .... Take 1 Tab Dailyprn 17)  Promethazine Hcl 25 Mg Tabs (Promethazine Hcl) .Marland Kitchen.. 1po Q 6 Hrs As Needed Nausea 18)  Miralax  Powd (Polyethylene Glycol 3350) .... Mix 1 Capful (17 Grams) in 8 Oz Water Daily 19)  Phillips Milk of Magnesia 7.75 % Susp (Magnesium Hydroxide) .... Take Capful As Needed 20)  Metronidazole 500 Mg Tabs (Metronidazole) .... Take 1 Tablet By Mouth Two Times A Day For 10 Days 21)  Cipro 500 Mg Tabs (Ciprofloxacin Hcl) .... Take 1 Tablet By Mouth  Two Times A Day For 10 Days  Allergies (verified): No Known Drug Allergies  Past History:  Past Medical History: Reviewed history from 01/21/2010 and no changes required. Diabetes mellitus, type II Hyperlipidemia Hypertension kidney stones,knee surgery,ankel surgery,bone spurs,torn ligaent Obstructive Sleep Apnea DVT- R Lower Extremity Pulmonary Embolus Diverticulosis, colon Depression Renal insufficiency Colonic polyps, hx of hypogonadism Obesity  Past Surgical History: Reviewed history from 01/21/2010 and no changes required. Knee Surgery- 2000 & 2003, cartilage damage Tonsillectomy and Adenoidectomy Ankle Surgery with bone  spurs and torn tendon- 2001 bilateral  COLONOSCOPY 2005-DIVERTICULOSIS/HYPERPLASTIS POLYPS-DUE FOR F/U 2015 EGD 2008-CHRONIC DUODENITIS  Social History: Reviewed history from 01/21/2010 and no changes required. Former Smoker Alcohol use-no Divorced 2 children work - back to work at [employer] since jan 2010 Daily Caffeine Use one per day  Review of Systems       The patient complains of arthritis/joint pain, back pain, fatigue, headaches-new, muscle pains/cramps, night sweats, shortness of breath, sleeping problems, swelling of feet/legs, and thirst - excessive.         chronic depression and anxiety blood sugars low 100's  Vital Signs:  Patient profile:   55 year old male Height:      74 inches Weight:      380 pounds BMI:     48.97 Pulse rate:   84 / minute Pulse (ortho):   102 / minute Pulse rhythm:   regular BP sitting:   96 / 64  (left arm) BP standing:   76 /  Cuff size:   large  Vitals Entered By: June McMurray CMA Duncan Dull) (January 29, 2010 1:56 PM)  Serial Vital Signs/Assessments:  Time      Position  BP       Pulse  Resp  Temp     By 2:46 PM   Lying RA  88/50    90                    Iva Boop MD, [facility] 2:46 PM   Sitting   84/60    90                    Iva Boop MD, [facility] 2:46 PM   Standing  76/      102                   Iva Boop MD, [facility]  Comments: 2:46 PM sitting left arm rechec (large cuff as others above) 84/60 By: Iva Boop MD, [facility]    Physical Exam  General:  Well developed, well nourished, no acute distress.obese.   Eyes:  anicteric Lungs:  Clear throughout to auscultation. Heart:  Regular rate and rhythm; no murmurs, rubs,  or bruits. no carotid bruits and upstrokes equal Abdomen:  obese and soft mild periumbilical tenderness and suprapubic wth deep palpation and muscle tension Extremities:  no edema Neurologic:  Alert and  oriented x 3   Impression & Recommendations:  Problem # 1:  ABDOMINAL PAIN,  GENERALIZED (ICD-789.07) Assessment Unchanged ? muscular or related to dehydration also possibly functional hx correlates with the repetitive motion of bending at work CT abd/pelvis is negative  Problem # 2:  CONSTIPATION (ICD-564.00) Assessment: Unchanged worse, he says ? colonoscopy - likely depending upon clinical course but with BUN up and orthostasis, not now await labs and PCP assessment colonoscopy or other endoscopy would be at hospital due to size  Problem # 3:  HYPOTENSION,  ORTHOSTATIC (ICD-458.0) Assessment: New hold lisinopril and spirono/HCTZ see PCP tomorrow ? if due to HCTZ I filled out work papers and indicated he would be out of work until mid March (estimated), as will need repeat GI assessment and likelycolonoscopy   Orders: TLB-BMP (Basic Metabolic Panel-BMET) ([id number])  Patient Instructions: 1)  Please go to the basement to have your lab tests drawn today. 2)  do not take liinopril or spironolactone HCTZ until told to again by MD 3)  we will arrange GI follow-up after your primary care follow-up 4)  do not return to work until MD oks 5)  You have an appointment with Dr. Jonny Ruiz on 01/30/10 @ 11:30am. 6)  Copy sent to : Oliver Barre, MD 7)  The medication list was reviewed and reconciled.  All changed / newly prescribed medications were explained.  A complete medication list was provided to the patient / caregiver.

## 2011-01-05 NOTE — Letter (Signed)
Summary: Generic Letter  [facility]  [address] [place], [place] [address]   Phone: [phone]  Fax: [phone]    02/13/2010  [facility] [address] [place], [place]  [address]  Dear Mr. Easterday,      This letter is to certify that you are able to return to work  later today, without restrction.       Sincerely,   Oliver Barre MD

## 2011-01-05 NOTE — Medication Information (Signed)
Summary: rov/sp  Anticoagulant Therapy  Managed by: Weston Brass, PharmD Referring MD: Nona Dell PCP: Oliver Barre, MD Supervising MD: Clifton James MD, Cristal Deer Indication 1: Pulmonary Embolism and Infarction (ICD-415.1) Lab Used: [facility] Site: [facility] INR POC 4.8 INR RANGE 2 - 3  Dietary changes: no    Health status changes: no    Bleeding/hemorrhagic complications: no    Recent/future hospitalizations: no    Any changes in medication regimen? no    Recent/future dental: no  Any missed doses?: no       Is patient compliant with meds? yes       Allergies: No Known Drug Allergies  Anticoagulation Management History:      The patient is taking warfarin and comes in today for a routine follow up visit.  Positive risk factors for bleeding include presence of serious comorbidities.  Negative risk factors for bleeding include an age less than 23 years old.  The bleeding index is 'intermediate risk'.  Positive CHADS2 values include History of HTN and History of Diabetes.  Negative CHADS2 values include Age > 61 years old.  The start date was 08/05/2005.  Anticoagulation responsible provider: Clifton James MD, Cristal Deer.  INR POC: 4.8.  Cuvette Lot#: [id number].  Exp: 07/2011.    Anticoagulation Management Assessment/Plan:      The patient's current anticoagulation dose is Coumadin 5 mg tabs: Take as directed by coumadin clinic..  The target INR is 2 - 3.  The next INR is due 04/30/2010.  Anticoagulation instructions were given to patient.  Results were reviewed/authorized by Weston Brass, PharmD.  He was notified by [facility].         Prior Anticoagulation Instructions: INR 4.7  Skip tomorrow's dose of Coumadin then take only 1 tablet on Sunday and decrease dose to 2 tablets every day except 2 1/2 tablets on Tuesday   Current Anticoagulation Instructions: INR-4.8 Hold dose today and Tomorrow. Restart on Wednesday. Start new dosing schedule. Take 2 tablets daily.  Return  in 10 days.

## 2011-01-05 NOTE — Assessment & Plan Note (Signed)
Summary: abdominal pain after meals/sheri    History of Present Illness Visit Type: Initial Consult Primary GI MD: Stan Head MD [facility] Primary Provider: Oliver Barre, MD Requesting Provider: Oliver Barre, MD Chief Complaint: Patient complains of lower abdominal pain that started mid Jan, that pain is constant. Patient complains of constipation and nausea. Patient states that he did a bottle of mag citrate last yesterday and he does have a BM when he take them. He states that the abdominal pain and constipation have kept him out of work. Patient complains of depression.  History of Present Illness:   55 YO MALE WITH MULTIPLE MEDICAL PROBLEMS WITH AODM,OBESITY,HTN,SLEEP APNEA,HX OF PE,COLON POLYPS,DIVERTICULOSIS,DEPRESSION,CHRONIC BACK PAIN.Marland Kitchen HE IS KNOWN TO DR Leone Payor FROM COLONOSCOPY IN 2005 SHOWING LEFT SIDED DIVERTICULOSIS,AND HYPERPLASTIC POLYPS. EGD IN 2008 WITH CHRONIC DUODENITIS. HE COME IN NOW WITH ONE MONTH HX OF LOWER ABDOMINAL PAIN,INTERMITTENT BUT PRESENT DAILY,AT TIMES SHARP,STABBINGWITH BENDING OVER. SOMETIMES WORSE ABOUT 30 MIN POST EATING. HIS APPETITE IS DECREASED,HAS LOST 15 POUNDS.NO FEVER, CHILLS. BMS HAVE BEEN CONSTIPATED-JUST THIS PAST MONTH, NO HEME. HE WAS GIVEN ULTRAM PER DR JOHN WHICH IS NOT HELPING. APPARENTLY HAS BEN DEPRESSED RECENTLY AS WELL. HAS NOT WORKED THIS WEEK DUE TO PAIN.   GI Review of Systems    Reports abdominal pain, loss of appetite, nausea, and  weight loss.     Location of  Abdominal pain: lower abdomen.    Denies acid reflux, belching, bloating, chest pain, dysphagia with liquids, dysphagia with solids, heartburn, vomiting, and  vomiting blood.      Reports change in bowel habits, constipation, and  diverticulosis.     Denies anal fissure, black tarry stools, fecal incontinence, heme positive stool, hemorrhoids, irritable bowel syndrome, jaundice, light color stool, liver problems, rectal bleeding, and  rectal pain.    Current Medications (verified): 1)   Actos 45 Mg Tabs (Pioglitazone Hcl) .Marland Kitchen.. 1 Tablet By Mouth Once A Day 2)  Aurora Lancet Super Thin 30g  Misc (Lancets) .... Use 1 Stick As Directed Three Times A Day 3)  Simvastatin 80 Mg  Tabs (Simvastatin) .Marland Kitchen.. 1 By Mouth Once Daily 4)  Onetouch Ultra Test  Strp (Glucose Blood) .... Use 1 Strip Three Times A Day 5)  Zetia 10 Mg Tabs (Ezetimibe) .... Take 1 Tablet By Mouth Once A Day 6)  Glipizide 10 Mg Tabs (Glipizide) .Marland Kitchen.. 1 By Mouth Two Times A Day 7)  Lisinopril 10 Mg  Tabs (Lisinopril) .Marland Kitchen.. 1 By Mouth Once Daily 8)  Ecotrin Low Strength 81 Mg  Tbec (Aspirin) .Marland Kitchen.. 1 By Mouth Qd 9)  Spironolactone-Hctz 25-25 Mg  Tabs (Spironolactone-Hctz) .Marland Kitchen.. 1 By Mouth Once Daily 10)  Tramadol Hcl 50 Mg Tabs (Tramadol Hcl) .Marland Kitchen.. 1 - 2 By Mouth Four Times Per Day As Needed For Pain 11)  Aciphex 20 Mg Tbec (Rabeprazole Sodium) .Marland Kitchen.. 1 By Mouth Once Daily 12)  Coumadin 5 Mg Tabs (Warfarin Sodium) .... Take As Directed By Coumadin Clinic. 13)  Proair Hfa 108 (90 Base) Mcg/act Aers (Albuterol Sulfate) .... 2 Puffs Four Times Per Day As Needed For Shortness of Breath 14)  Oxycodone Hcl 5 Mg Caps (Oxycodone Hcl) .Marland Kitchen.. 1 - 3 By Mouth Q 6 Hrs As Needed Pain 15)  Flexeril 5 Mg Tabs (Cyclobenzaprine Hcl) .Marland Kitchen.. 1 By Mouth Three Times A Day As Needed 16)  Tramadol Hcl 50 Mg Tabs (Tramadol Hcl) .... Take 1 Tab Dailyprn 17)  Promethazine Hcl 25 Mg Tabs (Promethazine Hcl) .Marland Kitchen.. 1po Q 6 Hrs As Needed  Nausea 18)  Magnesium Citrate 1.745 Gm/84ml Soln (Magnesium Citrate) .... Use As Needed 19)  Phillips Milk of Magnesia 7.75 % Susp (Magnesium Hydroxide) .... Take Capful As Needed  Allergies (verified): No Known Drug Allergies  Past History:  Past Medical History: Diabetes mellitus, type II Hyperlipidemia Hypertension kidney stones,knee surgery,ankel surgery,bone spurs,torn ligaent Obstructive Sleep Apnea DVT- R Lower Extremity Pulmonary Embolus Diverticulosis, colon Depression Renal insufficiency Colonic polyps, hx  of hypogonadism Obesity  Past Surgical History: Knee Surgery- 05/17/1999 & 2002-05-16, cartilage damage Tonsillectomy and Adenoidectomy Ankle Surgery with bone spurs and torn tendon- 05-16-2000 bilateral  COLONOSCOPY 2005-DIVERTICULOSIS/HYPERPLASTIS POLYPS-DUE FOR F/U 2014/05/16 EGD 2008-CHRONIC DUODENITIS  Family History: father with prostate cancer mother with HTN sister died with DM May 16, 2001 brother and 2 more sisters with DM No FH of Colon Cancer:  Social History: Former Smoker Alcohol use-no Divorced 2 children work - back to work at [employer] since jan 2010 Daily Caffeine Use one per day  Review of Systems       The patient complains of arthritis/joint pain, back pain, cough, depression-new, fatigue, headaches-new, muscle pains/cramps, night sweats, shortness of breath, sleeping problems, and swelling of feet/legs.  The patient denies allergy/sinus, anemia, anxiety-new, blood in urine, breast changes/lumps, change in vision, confusion, coughing up blood, fainting, fever, hearing problems, heart murmur, heart rhythm changes, itching, menstrual pain, nosebleeds, pregnancy symptoms, skin rash, sore throat, swollen lymph glands, thirst - excessive , urination - excessive , urination changes/pain, urine leakage, vision changes, and voice change.         ROS OTHERWISE AS IN HPI  Vital Signs:  Patient profile:   55 year old male Height:      74 inches Weight:      382.4 pounds BMI:     49.27 Pulse rate:   76 / minute Pulse rhythm:   regular BP sitting:   100 / 64  (left arm) Cuff size:   large  Vitals Entered By: Harlow Mares CMA Duncan Dull) (January 21, 2010 1:38 PM)  Physical Exam  General:  Well developed, well nourished, no acute distress.obese.   Head:  Normocephalic and atraumatic. Eyes:  PERRLA, no icterus. Lungs:  Clear throughout to auscultation. Heart:  Regular rate and rhythm; no murmurs, rubs,  or bruits. Abdomen:  OBESE, SOFT, TENDER LLQ/LMQ PRIMARILY NO PALP MASS OR  HSM,BS+,NO BRUIT Rectal:  HEME NEGATIVE Extremities:  trace pedal edema.   Neurologic:  Alert and  oriented x4;  grossly normal neurologically. Psych:  Alert and cooperative. Normal mood and affect.   Impression & Recommendations:  Problem # 1:  ABDOMINAL PAIN-LLQ (ICD-789.04) Assessment New 54 YO MALE WITH MULTIPLE MEDICAL PROBLEMS WITH ONE MONTH HX OF LOWER ABDOMINAL PAIN AND CONSTIPATION. SUSPECT DIVERTICULITIS,R/O OCCULT NEOPLASM  CT ABDOMEN/ PELVIS. PAIN IS SUCH THAT HE HAS MISSEDWORK OVER THE PASTCOUPLE WEEKS RECENT LABS PER DR JOHN REVIEWED START CIPRO 500 G TWICE DAILY X 10 DAYS FLAGYL 500 MG TWICE DAILY X 10 DAYS START MIRALAX 17 GM IN 8 OZ WATER DAILY FOLLOW UP DR. Leone Payor IN 2 WEEKS.  Problem # 2:  COUMADIN THERAPY (ICD-V58.61) Assessment: Comment Only HX PE/DVT  Problem # 3:  OBESITY, MORBID (ICD-278.01) Assessment: Comment Only  Problem # 4:  COLONIC POLYPS, HX OF (ICD-V12.72) Assessment: Comment Only HYPERPLASTIC DUE FOR F/U May 16, 2014  Problem # 5:  HYPERTENSION (ICD-401.9) Assessment: Comment Only  Problem # 6:  DEPRESSION (ICD-311) Assessment: Comment Only  Problem # 7:  DIABETES MELLITUS, TYPE II (ICD-250.00) Assessment: Comment Only  Other Orders: CT Abdomen/Pelvis with Contrast (CT Abd/Pelvis w/con)  Patient Instructions: 1)  Please pick up your medications at your pharmacy.  2)  Your CT is scheduled for 2/17.  See seperate instructions.  We will call you with these results. 3)  Call our office on Monday with a progress report of your condition. 4)  Stop taking Ultram. 5)  Begin using Miralax instead of Mag Citrate. 6)  The medication list was reviewed and reconciled.  All changed / newly prescribed medications were explained.  A complete medication list was provided to the patient / caregiver. Prescriptions: METRONIDAZOLE 500 MG TABS (METRONIDAZOLE) Take 1 tablet by mouth two times a day for 10 days  #20 x 0   Entered by:   Francee Piccolo CMA  (AAMA)   Authorized by:   Sammuel Cooper PA-c   Signed by:   Francee Piccolo CMA (AAMA) on 01/21/2010   Method used:   Faxed to ...       [facility] (retail)       [address]       [place], [place]  [address]       Ph: [phone]       Fax: [phone]   RxID:   [id number] CIPRO 500 MG TABS (CIPROFLOXACIN HCL) Take 1 tablet by mouth two times a day for 10 days  #20 x 0   Entered by:   Francee Piccolo CMA (AAMA)   Authorized by:   Sammuel Cooper PA-c   Signed by:   Francee Piccolo CMA (AAMA) on 01/21/2010   Method used:   Faxed to ...       [facility] (retail)       [address]       [place], [place]  [address]       Ph: [phone]       Fax: [phone]   RxID:   [id number]

## 2011-01-05 NOTE — Medication Information (Signed)
Summary: rov/tm  Anticoagulant Therapy  Managed by: Cloyde Reams, RN, BSN Referring MD: Nona Dell PCP: Oliver Barre, MD Supervising MD: Gala Romney MD, Reuel Boom Indication 1: Pulmonary Embolism and Infarction (ICD-415.1) Lab Used: [facility] Site: [facility] INR POC 2.5 INR RANGE 2 - 3  Dietary changes: no    Health status changes: no    Bleeding/hemorrhagic complications: no    Recent/future hospitalizations: no    Any changes in medication regimen? no    Recent/future dental: no  Any missed doses?: no       Is patient compliant with meds? yes       Allergies: No Known Drug Allergies  Anticoagulation Management History:      The patient is taking warfarin and comes in today for a routine follow up visit.  Positive risk factors for bleeding include presence of serious comorbidities.  Negative risk factors for bleeding include an age less than 79 years old.  The bleeding index is 'intermediate risk'.  Positive CHADS2 values include History of HTN and History of Diabetes.  Negative CHADS2 values include Age > 23 years old.  The start date was 08/05/2005.  Anticoagulation responsible provider: Bensimhon MD, Reuel Boom.  INR POC: 2.5.  Cuvette Lot#: [id number].  Exp: 10/2011.    Anticoagulation Management Assessment/Plan:      The patient's current anticoagulation dose is Coumadin 5 mg tabs: Take as directed by coumadin clinic..  The target INR is 2 - 3.  The next INR is due 10/22/2010.  Anticoagulation instructions were given to patient.  Results were reviewed/authorized by Cloyde Reams, RN, BSN.  He was notified by Cloyde Reams RN.         Prior Anticoagulation Instructions: INR 1.6  Take an extra 5mg  today, then start taking 10mg  daily except 12.5mg  on Tuesdays, Thursdays, and Saturdays. Recheck in 2 weeks.   Current Anticoagulation Instructions: INR 2.5  Continue on same dosage 2 tablets daily except 2.5 tablets on Tuesdays, Thursdays, and Saturdays.  Recheck in 3  weeks.

## 2011-01-05 NOTE — Medication Information (Signed)
Summary: rov/tm  Anticoagulant Therapy  Managed by: Cloyde Reams, RN, BSN Referring MD: Nona Dell PCP: Oliver Barre, MD Supervising MD: Riley Kill MD, Maisie Fus Indication 1: Pulmonary Embolism and Infarction (ICD-415.1) Lab Used: [facility] Site: [facility] INR POC 2.5 INR RANGE 2 - 3  Dietary changes: no    Health status changes: no    Bleeding/hemorrhagic complications: no    Recent/future hospitalizations: no    Any changes in medication regimen? no    Recent/future dental: no  Any missed doses?: no       Is patient compliant with meds? yes       Allergies: No Known Drug Allergies  Anticoagulation Management History:      The patient is taking warfarin and comes in today for a routine follow up visit.  Positive risk factors for bleeding include presence of serious comorbidities.  Negative risk factors for bleeding include an age less than 39 years old.  The bleeding index is 'intermediate risk'.  Positive CHADS2 values include History of HTN and History of Diabetes.  Negative CHADS2 values include Age > 13 years old.  The start date was 08/05/2005.  Anticoagulation responsible provider: Riley Kill MD, Maisie Fus.  INR POC: 2.5.  Cuvette Lot#: [id number].  Exp: 07/2011.    Anticoagulation Management Assessment/Plan:      The patient's current anticoagulation dose is Coumadin 5 mg tabs: Take as directed by coumadin clinic..  The target INR is 2 - 3.  The next INR is due 06/19/2010.  Anticoagulation instructions were given to patient.  Results were reviewed/authorized by Cloyde Reams, RN, BSN.  He was notified by Cloyde Reams RN.         Prior Anticoagulation Instructions: INR 3.4 Today take 1 pill then change dose to 2 pills everyday except 1 1/2 pills on Sundays. Recheck in 2 weeks.   Current Anticoagulation Instructions: INR 2.5  Continue on same dosage 2 tablets daily except 1.5 tablets on Sundays.   Recheck in 3 weeks.

## 2011-01-07 NOTE — Medication Information (Signed)
Summary: [facility]   Imported By: Lester Orient 12/22/2010 08:11:17  _____________________________________________________________________  External Attachment:    Type:   Image     Comment:   External Document

## 2011-01-07 NOTE — Medication Information (Signed)
Summary: rov/mw  Anticoagulant Therapy  Managed by: Bethena Midget, RN, BSN Referring MD: Nona Dell PCP: Oliver Barre, MD Supervising MD: Juanda Chance MD, Krish Bailly Indication 1: Pulmonary Embolism and Infarction (ICD-415.1) Lab Used: [facility] Site: [facility] INR POC 2.1 INR RANGE 2 - 3  Dietary changes: no    Health status changes: no    Bleeding/hemorrhagic complications: no    Recent/future hospitalizations: no    Any changes in medication regimen? no    Recent/future dental: no  Any missed doses?: no       Is patient compliant with meds? yes       Allergies: No Known Drug Allergies  Anticoagulation Management History:      The patient is taking warfarin and comes in today for a routine follow up visit.  Positive risk factors for bleeding include presence of serious comorbidities.  Negative risk factors for bleeding include an age less than 29 years old.  The bleeding index is 'intermediate risk'.  Positive CHADS2 values include History of HTN and History of Diabetes.  Negative CHADS2 values include Age > 74 years old.  The start date was 08/05/2005.  Anticoagulation responsible provider: Juanda Chance MD, Smitty Cords.  INR POC: 2.1.  Cuvette Lot#: [id number].  Exp: 12/2011.    Anticoagulation Management Assessment/Plan:      The patient's current anticoagulation dose is Coumadin 5 mg tabs: Take as directed by coumadin clinic..  The target INR is 2 - 3.  The next INR is due 12/24/2010.  Anticoagulation instructions were given to patient.  Results were reviewed/authorized by Bethena Midget, RN, BSN.  He was notified by Bethena Midget, RN, BSN.         Prior Anticoagulation Instructions: INR 2.4 Continue taking 2.5 tablets on tuesday, thursday, and saturday. And 2 tablets all other days. Recheck in 3 weeks.   Current Anticoagulation Instructions: INR 2.1 Continue 10mg s everyday except 12.5mg s on Tuesdays, Thursdays and Saturdays. Recheck in 4 weeks.

## 2011-01-07 NOTE — Medication Information (Signed)
Summary: rov/tm  Anticoagulant Therapy  Managed by: Weston Brass, PharmD Referring MD: Nona Dell PCP: Oliver Barre, MD Supervising MD: Ladona Ridgel MD, Sharlot Gowda Indication 1: Pulmonary Embolism and Infarction (ICD-415.1) Lab Used: [facility] Site: [facility] INR POC 2.9 INR RANGE 2 - 3  Dietary changes: no    Health status changes: no    Bleeding/hemorrhagic complications: no    Recent/future hospitalizations: no    Any changes in medication regimen? no    Recent/future dental: no  Any missed doses?: no       Is patient compliant with meds? yes       Allergies: No Known Drug Allergies  Anticoagulation Management History:      The patient is taking warfarin and comes in today for a routine follow up visit.  Positive risk factors for bleeding include presence of serious comorbidities.  Negative risk factors for bleeding include an age less than 66 years old.  The bleeding index is 'intermediate risk'.  Positive CHADS2 values include History of HTN and History of Diabetes.  Negative CHADS2 values include Age > 11 years old.  The start date was 08/05/2005.  Anticoagulation responsible provider: Ladona Ridgel MD, Sharlot Gowda.  INR POC: 2.9.  Cuvette Lot#: [id number].  Exp: 12/2011.    Anticoagulation Management Assessment/Plan:      The patient's current anticoagulation dose is Coumadin 5 mg tabs: Take as directed by coumadin clinic..  The target INR is 2 - 3.  The next INR is due 01/25/2011.  Anticoagulation instructions were given to patient.  Results were reviewed/authorized by Weston Brass, PharmD.  He was notified by Stephannie Peters, PharmD Candidate .         Prior Anticoagulation Instructions: INR 2.1 Continue 10mg s everyday except 12.5mg s on Tuesdays, Thursdays and Saturdays. Recheck in 4 weeks.   Current Anticoagulation Instructions: INR   Coumadin 5 mg tablets - Continue 2 tablets every day except 2.5 tablets on Tuesdays, Thursdays and Saturdays

## 2011-01-17 ENCOUNTER — Observation Stay (HOSPITAL_COMMUNITY)
Admission: EM | Admit: 2011-01-17 | Discharge: 2011-01-18 | Disposition: A | Discharge status: Home / Self Care | Payer: BC Managed Care – PPO | Attending: Internal Medicine | Admitting: Internal Medicine

## 2011-01-17 ENCOUNTER — Emergency Department (HOSPITAL_COMMUNITY): Payer: BC Managed Care – PPO

## 2011-01-17 DIAGNOSIS — Z7901 Long term (current) use of anticoagulants: Secondary | ICD-10-CM | POA: Insufficient documentation

## 2011-01-17 DIAGNOSIS — I10 Essential (primary) hypertension: Secondary | ICD-10-CM | POA: Insufficient documentation

## 2011-01-17 DIAGNOSIS — E119 Type 2 diabetes mellitus without complications: Secondary | ICD-10-CM | POA: Insufficient documentation

## 2011-01-17 DIAGNOSIS — R079 Chest pain, unspecified: Principal | ICD-10-CM | POA: Insufficient documentation

## 2011-01-17 DIAGNOSIS — R0789 Other chest pain: Secondary | ICD-10-CM | POA: Insufficient documentation

## 2011-01-17 DIAGNOSIS — Z86711 Personal history of pulmonary embolism: Secondary | ICD-10-CM | POA: Insufficient documentation

## 2011-01-17 DIAGNOSIS — E669 Obesity, unspecified: Secondary | ICD-10-CM | POA: Insufficient documentation

## 2011-01-17 DIAGNOSIS — E785 Hyperlipidemia, unspecified: Secondary | ICD-10-CM | POA: Insufficient documentation

## 2011-01-17 LAB — DIFFERENTIAL
Lymphocytes Relative: 21 % (ref 12–46)
Lymphs Abs: 2.4 10*3/uL (ref 0.7–4.0)
Neutrophils Relative %: 71 % (ref 43–77)

## 2011-01-17 LAB — PROTIME-INR: Prothrombin Time: 24.6 seconds — ABNORMAL HIGH (ref 11.6–15.2)

## 2011-01-17 LAB — POCT CARDIAC MARKERS
CKMB, poc: 4.4 ng/mL (ref 1.0–8.0)
Troponin i, poc: <0.05 ng/mL (ref 0.00–0.09)

## 2011-01-17 LAB — CBC
HCT: 41.5 % (ref 39.0–52.0)
Hemoglobin: 13.8 g/dL (ref 13.0–17.0)
MCV: 69.6 fL — ABNORMAL LOW (ref 78.0–100.0)
Platelets: 225 10*3/uL (ref 150–400)
RBC: 5.96 MIL/uL — ABNORMAL HIGH (ref 4.22–5.81)
WBC: 11.3 10*3/uL — ABNORMAL HIGH (ref 4.0–10.5)

## 2011-01-17 LAB — BASIC METABOLIC PANEL
BUN: 27 mg/dL — ABNORMAL HIGH (ref 6–23)
GFR calc non Af Amer: 50 mL/min — ABNORMAL LOW (ref >60)
Potassium: 4.6 mEq/L (ref 3.5–5.1)
Sodium: 133 mEq/L — ABNORMAL LOW (ref 135–145)

## 2011-01-18 LAB — PROTIME-INR
INR: 2.38 — ABNORMAL HIGH (ref 0.00–1.49)
Prothrombin Time: 26.1 seconds — ABNORMAL HIGH (ref 11.6–15.2)

## 2011-01-18 LAB — LIPID PANEL
HDL: 31 mg/dL — ABNORMAL LOW (ref >39)
Total CHOL/HDL Ratio: 3.1 RATIO
VLDL: 22 mg/dL (ref 0–40)

## 2011-01-18 LAB — CARDIAC PANEL(CRET KIN+CKTOT+MB+TROPI)
CK, MB: 3.8 ng/mL (ref 0.3–4.0)
Relative Index: 1.3 (ref 0.0–2.5)
Total CK: 369 U/L — ABNORMAL HIGH (ref 7–232)

## 2011-01-18 LAB — PHOSPHORUS: Phosphorus: 3.5 mg/dL (ref 2.3–4.6)

## 2011-01-18 LAB — CBC
Hemoglobin: 12.5 g/dL — ABNORMAL LOW (ref 13.0–17.0)
MCH: 22.6 pg — ABNORMAL LOW (ref 26.0–34.0)
RBC: 5.54 MIL/uL (ref 4.22–5.81)
WBC: 8.9 10*3/uL (ref 4.0–10.5)

## 2011-01-18 LAB — DIFFERENTIAL
Basophils Absolute: 0 10*3/uL (ref 0.0–0.1)
Lymphocytes Relative: 34 % (ref 12–46)
Monocytes Relative: 7 % (ref 3–12)
Neutro Abs: 5.2 10*3/uL (ref 1.7–7.7)

## 2011-01-18 LAB — TSH: TSH: 3.354 u[IU]/mL (ref 0.350–4.500)

## 2011-01-18 LAB — COMPREHENSIVE METABOLIC PANEL
ALT: 22 U/L (ref 0–53)
AST: 23 U/L (ref 0–37)
Albumin: 3.5 g/dL (ref 3.5–5.2)
Alkaline Phosphatase: 55 U/L (ref 39–117)
GFR calc Af Amer: >60 mL/min (ref >60)
Potassium: 4.3 mEq/L (ref 3.5–5.1)
Sodium: 138 mEq/L (ref 135–145)
Total Protein: 6.5 g/dL (ref 6.0–8.3)

## 2011-01-18 LAB — GLUCOSE, CAPILLARY
Glucose-Capillary: 225 mg/dL — ABNORMAL HIGH (ref 70–99)
Glucose-Capillary: 139 mg/dL — ABNORMAL HIGH (ref 70–99)

## 2011-01-18 LAB — HEMOGLOBIN A1C: Hgb A1c MFr Bld: 7 % — ABNORMAL HIGH (ref <5.7)

## 2011-01-18 LAB — APTT: aPTT: 40 seconds — ABNORMAL HIGH (ref 24–37)

## 2011-01-18 LAB — CK TOTAL AND CKMB (NOT AT ARMC): Total CK: 411 U/L — ABNORMAL HIGH (ref 7–232)

## 2011-01-19 NOTE — Discharge Summary (Signed)
[name], [name]               ACCOUNT NO.:  [id number]  MEDICAL RECORD NO.:  [id number]           PATIENT TYPE:  I  LOCATION:  6715                         FACILITY:  [facility]  PHYSICIAN:  Andreas Blower, MD       DATE OF BIRTH:  1956-09-02  DATE OF ADMISSION:  01/17/2011 DATE OF DISCHARGE:  01/18/2011                              DISCHARGE SUMMARY   PRIMARY CARE PHYSICIAN:  Corwin Levins, MD  DISCHARGE DIAGNOSES: 1. Chest pain, most likely musculoskeletal in nature. 2. Hypertension. 3. History of pulmonary embolism. 4. Hyperlipidemia. 5. Diabetes type 2. 6. Obesity. 7. History of syncope secondary to pulmonary embolism in the past. 8. Obesity.  DISCHARGE MEDICATIONS: 1. Ageless male over the counter 1 tablet p.o. daily. 2. Warfarin 5 mg 2-1/2 tablets on Tuesday, Thursday, and Saturday and     2 tablets on all other days. 3. Glipizide 10 mg p.o. q.a.m. 4. Kombiglyze 1000/5 mg 1 tablet p.o. q.a.m. 5. Lisinopril 5 mg p.o. q.a.m. 6. Phentermine 37.5 mg p.o. daily. 7. Simvastatin 40 mg p.o. q.a.m. 8. Tramadol 50 mg 1-2 tablets 4 times a day as needed. 9. Zetia 10 mg p.o. q.a.m.  BRIEF ADMITTING HISTORY AND PHYSICAL:  [name] is a 55 year old African American male with history of hypertension, diabetes, and PE on chronic Coumadin, who presents with a 2-week history of left-sided chest discomfort that is worse with palpation.  RADIOLOGY/IMAGING:  The patient has had chest x-ray, 2-view, which was negative chest.  LABORATORY DATA:  CBC shows a white count of 8.9, hemoglobin 12.5, hematocrit 38.5, platelet count 192.  INR 2.38, D-dimer 0.22. Electrolytes normal with a creatinine of 1.16.  Hemoglobin A1c 7.0. Troponins negative x2.  LDL is 44.  TSH is 3.354.  DISPOSITION AND FOLLOWUP:  The patient is to follow up with Dr. Oliver Barre in 1 week and Dr. Jonny Ruiz to consider getting a 2-D echocardiogram if not already done in the past.  HOSPITAL COURSE BY PROBLEM: 1. Chest  pain.  The patient was admitted and was ruled out for acute     coronary syndrome.  The patient's INR was therapeutic and D-dimer     was normal, indicating low probability for a thromboembolic event.     The patient on palpation on his chest had reproducible chest pain     and reports that he has been sleeping on his left side.  I suspect     that his chest pain is musculoskeletal in nature from sleeping on     his left side.  I also instructed the patient to follow up with his     primary care physician to consider getting a 2-D echocardiogram at     that time.  The patient had no events on tele while he was     admitted. 2. Hypertension.  Continue the patient on home medications, was stable     during the course of hospital stay. 3. History of PE.  Continue Coumadin.  INR is therapeutic. 4. Diabetes.  Continue home medications, withhold parameters. 5. Hyperlipidemia.  Continue Zetia.  Time spent on discharge talking to  the patient and coordinating care was 25 minutes.     Andreas Blower, MD     SR/MEDQ  D:  01/18/2011  T:  01/19/2011  Job:  [id number]  cc:   Corwin Levins, MD  Electronically Signed by Wardell Heath Kyndel Egger  on 01/19/2011 10:26:21 PM

## 2011-01-20 ENCOUNTER — Encounter: Payer: Self-pay | Admitting: Internal Medicine

## 2011-01-20 ENCOUNTER — Ambulatory Visit (INDEPENDENT_AMBULATORY_CARE_PROVIDER_SITE_OTHER): Payer: BC Managed Care – PPO | Admitting: Internal Medicine

## 2011-01-20 DIAGNOSIS — R079 Chest pain, unspecified: Secondary | ICD-10-CM | POA: Insufficient documentation

## 2011-01-20 DIAGNOSIS — R0602 Shortness of breath: Secondary | ICD-10-CM

## 2011-01-20 DIAGNOSIS — E119 Type 2 diabetes mellitus without complications: Secondary | ICD-10-CM

## 2011-01-20 DIAGNOSIS — I10 Essential (primary) hypertension: Secondary | ICD-10-CM

## 2011-01-25 ENCOUNTER — Encounter (INDEPENDENT_AMBULATORY_CARE_PROVIDER_SITE_OTHER): Payer: BC Managed Care – PPO

## 2011-01-25 ENCOUNTER — Encounter: Payer: Self-pay | Admitting: Cardiovascular Disease

## 2011-01-25 DIAGNOSIS — I2699 Other pulmonary embolism without acute cor pulmonale: Secondary | ICD-10-CM

## 2011-01-25 DIAGNOSIS — Z7901 Long term (current) use of anticoagulants: Secondary | ICD-10-CM

## 2011-01-25 LAB — CONVERTED CEMR LAB: POC INR: 1.4

## 2011-01-27 NOTE — Letter (Signed)
Summary: Out of Work  [facility]  [address] [place], [place] [address]   Phone: [phone]  Fax: [phone]    January 20, 2011   Employee:  [name]    To Whom It May Concern:   For Medical reasons, please excuse the above named employee from work for the following dates:  Start:   Jan 17, 2011  End:   Jan 20, 2011   -   to return to work without restriction Jan 21, 2011  If you need additional information, please feel free to contact our office.         Sincerely,    Corwin Levins MD

## 2011-01-27 NOTE — Assessment & Plan Note (Signed)
Summary: post hops   Vital Signs:  Patient profile:   55 year old [name] Height:      [name] inches Weight:      354.38 pounds BMI:     45.66 O2 Sat:      96 % on Room air Temp:     98.9 degrees F oral Pulse rate:   84 / minute BP sitting:   102 / 80  (left arm) Cuff size:   large  Vitals Entered By: Zella Ball Ewing CMA Duncan Dull) (January 20, 2011 3:42 PM)  O2 Flow:  Room air CC: Post Hospital/RE   Primary Care Provider:  Oliver Barre, MD  CC:  Post Hospital/RE.  History of Present Illness: here to f/u  - was just recetnly hospd with CP, r/o'd for MI and overall felt to be MSK in origin after w/u (now better with his tramadol);  but with some DOE still post hospn with exertion; Pt denies other CP,  wheezing, orthopnea, pnd, worsening LE edema, palps, dizziness or syncope . Pt denies new neuro symptoms such as headache, facial or extremity weakness  Pt denies polydipsia, polyuria, or low sugar symptoms such as shakiness improved with eating.  Overall good compliance with meds, trying to follow low chol, DM diet, wt stable, little excercise however .  No fever, wt loss, night sweats, loss of appetite or other constitutional symptoms  Overall good compliance with meds, and good tolerability. Denies worsening depressive symptoms, suicidal ideation, or panic.    Problems Prior to Update: 1)  Chest Pain  (ICD-786.50) 2)  Dyspnea  (ICD-786.05) 3)  Preventive Health Care  (ICD-V70.0) 4)  Diab w/o Mention Comp Type Ii/uns Type Uncntrl  (ICD-250.02) 5)  Change in Bowels  (ICD-787.99) 6)  Dehydration  (ICD-276.51) 7)  Hypotension, Orthostatic  (ICD-458.0) 8)  Constipation  (ICD-564.00) 9)  Coumadin Therapy  (ICD-V58.61) 10)  Obesity  (ICD-278.00) 11)  Abdominal Pain-llq  (ICD-789.04) 12)  Abdominal Pain, Upper  (ICD-789.09) 13)  Back Pain  (ICD-724.5) 14)  Degenerative Joint Disease, Knees, Bilateral  (ICD-715.96) 15)  Wheezing  (ICD-786.07) 16)  Smoker  (ICD-305.1) 17)  Wheezing   (ICD-786.07) 18)  Leg Pain, Left  (ICD-729.5) 19)  Obesity, Morbid  (ICD-278.01) 20)  Hypogonadism, [name]  (ICD-257.2) 21)  Uti  (ICD-599.0) 22)  Colonic Polyps, Hx of  (ICD-V12.72) 23)  Renal Insufficiency  (ICD-588.9) 24)  Depression  (ICD-311) 25)  Diverticulosis, Colon  (ICD-562.10) 26)  Hypertension  (ICD-401.9) 27)  Hyperlipidemia  (ICD-272.4) 28)  Diabetes Mellitus, Type II  (ICD-250.00)  Medications Prior to Update: 1)  Kombiglyze Xr 04-999 Mg Xr24h-Tab (Saxagliptin-Metformin) .Marland Kitchen.. 1po Once Daily 2)  Aurora Lancet Super Thin 30g  Misc (Lancets) .... Use 1 Stick As Directed Three Times A Day 3)  Simvastatin 40 Mg Tabs (Simvastatin) .Marland Kitchen.. 1 By Mouth Once Daily 4)  Onetouch Ultra Test  Strp (Glucose Blood) .... Use 1 Strip Three Times A Day 5)  Zetia 10 Mg Tabs (Ezetimibe) .... Take 1 Tablet By Mouth Once A Day 6)  Glipizide 10 Mg Tabs (Glipizide) .Marland Kitchen.. 1 By Mouth Q Am Only 7)  Lisinopril 5 Mg Tabs (Lisinopril) .Marland Kitchen.. 1 By Mouth Once Daily 8)  Ecotrin Low Strength 81 Mg  Tbec (Aspirin) .Marland Kitchen.. 1 By Mouth Qd 9)  Hydrochlorothiazide 25 Mg Tabs (Hydrochlorothiazide) .Marland Kitchen.. 1po Once Daily 10)  Tramadol Hcl 50 Mg Tabs (Tramadol Hcl) .Marland Kitchen.. 1 - 2 By Mouth Four Times Per Day As Needed For Pain 11)  Aciphex 20 Mg  Tbec (Rabeprazole Sodium) .Marland Kitchen.. 1 By Mouth Once Daily 12)  Coumadin 5 Mg Tabs (Warfarin Sodium) .... Take As Directed By Coumadin Clinic. 13)  Proair Hfa 108 (90 Base) Mcg/act Aers (Albuterol Sulfate) .... 2 Puffs Four Times Per Day As Needed For Shortness of Breath 14)  Flexeril 5 Mg Tabs (Cyclobenzaprine Hcl) .Marland Kitchen.. 1 By Mouth Three Times A Day As Needed 15)  Promethazine Hcl 25 Mg Tabs (Promethazine Hcl) .Marland Kitchen.. 1po Q 6 Hrs As Needed Nausea 16)  Miralax  Powd (Polyethylene Glycol 3350) .... Mix 1 Capful (17 Grams) in 8 Oz Water Daily 17)  Phillips Milk of Magnesia 7.75 % Susp (Magnesium Hydroxide) .... Take Capful As Needed 18)  Cymbalta 60 Mg Cpep (Duloxetine Hcl) .Marland Kitchen.. 1 By Mouth Once  Daily 19)  Phentermine Hcl 37.5 Mg Caps (Phentermine Hcl) .Marland Kitchen.. 1po Once Daily 20)  Dicyclomine Hcl 20 Mg Tabs (Dicyclomine Hcl) .Marland Kitchen.. 1 By Mouth Every 6 Hrs As Needed For Abdominal Pain  Current Medications (verified): 1)  Kombiglyze Xr 04-999 Mg Xr24h-Tab (Saxagliptin-Metformin) .Marland Kitchen.. 1po Once Daily 2)  Aurora Lancet Super Thin 30g  Misc (Lancets) .... Use 1 Stick As Directed Three Times A Day 3)  Simvastatin 40 Mg Tabs (Simvastatin) .Marland Kitchen.. 1 By Mouth Once Daily 4)  Onetouch Ultra Test  Strp (Glucose Blood) .... Use 1 Strip Three Times A Day 5)  Zetia 10 Mg Tabs (Ezetimibe) .... Take 1 Tablet By Mouth Once A Day 6)  Glipizide 10 Mg Tabs (Glipizide) .Marland Kitchen.. 1 By Mouth Q Am Only 7)  Lisinopril 5 Mg Tabs (Lisinopril) .Marland Kitchen.. 1 By Mouth Once Daily 8)  Ecotrin Low Strength 81 Mg  Tbec (Aspirin) .Marland Kitchen.. 1 By Mouth Qd 9)  Hydrochlorothiazide 25 Mg Tabs (Hydrochlorothiazide) .Marland Kitchen.. 1po Once Daily 10)  Tramadol Hcl 50 Mg Tabs (Tramadol Hcl) .Marland Kitchen.. 1 - 2 By Mouth Four Times Per Day As Needed For Pain 11)  Aciphex 20 Mg Tbec (Rabeprazole Sodium) .Marland Kitchen.. 1 By Mouth Once Daily 12)  Coumadin 5 Mg Tabs (Warfarin Sodium) .... Take As Directed By Coumadin Clinic. 13)  Proair Hfa 108 (90 Base) Mcg/act Aers (Albuterol Sulfate) .... 2 Puffs Four Times Per Day As Needed For Shortness of Breath 14)  Flexeril 5 Mg Tabs (Cyclobenzaprine Hcl) .Marland Kitchen.. 1 By Mouth Three Times A Day As Needed 15)  Promethazine Hcl 25 Mg Tabs (Promethazine Hcl) .Marland Kitchen.. 1po Q 6 Hrs As Needed Nausea 16)  Miralax  Powd (Polyethylene Glycol 3350) .... Mix 1 Capful (17 Grams) in 8 Oz Water Daily 17)  Phillips Milk of Magnesia 7.75 % Susp (Magnesium Hydroxide) .... Take Capful As Needed 18)  Cymbalta 60 Mg Cpep (Duloxetine Hcl) .Marland Kitchen.. 1 By Mouth Once Daily 19)  Phentermine Hcl 37.5 Mg Caps (Phentermine Hcl) .Marland Kitchen.. 1po Once Daily 20)  Dicyclomine Hcl 20 Mg Tabs (Dicyclomine Hcl) .Marland Kitchen.. 1 By Mouth Every 6 Hrs As Needed For Abdominal Pain  Allergies (verified): No Known Drug  Allergies  Past History:  Past Medical History: Last updated: 01/21/2010 Diabetes mellitus, type II Hyperlipidemia Hypertension kidney stones,knee surgery,ankel surgery,bone spurs,torn ligaent Obstructive Sleep Apnea DVT- R Lower Extremity Pulmonary Embolus Diverticulosis, colon Depression Renal insufficiency Colonic polyps, hx of hypogonadism Obesity  Past Surgical History: Last updated: 03/02/2010 Knee Surgery- 2000 & 2003, cartilage damage Tonsillectomy and Adenoidectomy Ankle Surgery with bone spurs and torn tendon- 2001 bilateral  COLONOSCOPY 2005-DIVERTICULOSIS/HYPERPLASTIC POLYPS-DUE FOR F/U 2015 EGD 2008-CHRONIC DUODENITIS  Social History: Last updated: 10/13/2010 Former Smoker Alcohol use-no Divorced 2 children work - back to  work at [employer] since jan 2010 Daily Caffeine Use one per day Drug use-no  Risk Factors: Smoking Status: quit (10/05/2007)  Review of Systems       all otherwise negative per pt -    Physical Exam  General:  alert and overweight-appearing.   Head:  normocephalic and atraumatic.   Eyes:  vision grossly intact, pupils equal, and pupils round.   Ears:  R ear normal and L ear normal.   Nose:  no external deformity and no nasal discharge.   Mouth:  no gingival abnormalities and pharynx pink and moist.   Neck:  supple and no masses.   Lungs:  normal respiratory effort and normal breath sounds.   Heart:  normal rate and regular rhythm.   Msk:  no chest wall tenderness today Extremities:  no edema, no erythema    Impression & Recommendations:  Problem # 1:  HYPERTENSION (ICD-401.9)  His updated medication list for this problem includes:    Lisinopril 5 Mg Tabs (Lisinopril) .Marland Kitchen... 1 by mouth once daily    Hydrochlorothiazide 25 Mg Tabs (Hydrochlorothiazide) .Marland Kitchen... 1po once daily  BP today: 102/80 Prior BP: 110/64 (10/13/2010)  Labs Reviewed: K+: 4.8 (11/24/2010) Creat: : 1.0 (11/24/2010)   Chol: 112  (11/24/2010)   HDL: 44.90 (11/24/2010)   LDL: 60 (11/24/2010)   TG: 34.0 (11/24/2010) stable overall by hx and exam, ok to continue meds/tx as is   Problem # 2:  DYSPNEA (ICD-786.05) unclear etiology - for echo as recommended at hosp inpt d/c Orders: Echo Referral (Echo)  Problem # 3:  CHEST PAIN (ICD-786.50) ok for tramadol for prob MSK pain;  f/u any worsening symptoms  Problem # 4:  DIABETES MELLITUS, TYPE II (ICD-250.00)  His updated medication list for this problem includes:    Kombiglyze Xr 04-999 Mg Xr24h-tab (Saxagliptin-metformin) .Marland Kitchen... 1po once daily    Glipizide 10 Mg Tabs (Glipizide) .Marland Kitchen... 1 by mouth q am only    Lisinopril 5 Mg Tabs (Lisinopril) .Marland Kitchen... 1 by mouth once daily    Ecotrin Low Strength 81 Mg Tbec (Aspirin) .Marland Kitchen... 1 by mouth qd  Labs Reviewed: Creat: 1.0 (11/24/2010)    Reviewed HgBA1c results: 7.2 (11/24/2010)  7.8 (04/14/2010) stable overall by hx and exam, ok to continue meds/tx as is   Complete Medication List: 1)  Kombiglyze Xr 04-999 Mg Xr24h-tab (Saxagliptin-metformin) .Marland Kitchen.. 1po once daily 2)  Aurora Lancet Super Thin 30g Misc (Lancets) .... Use 1 stick as directed three times a day 3)  Simvastatin 40 Mg Tabs (Simvastatin) .Marland Kitchen.. 1 by mouth once daily 4)  Onetouch Ultra Test Strp (Glucose blood) .... Use 1 strip three times a day 5)  Zetia 10 Mg Tabs (Ezetimibe) .... Take 1 tablet by mouth once a day 6)  Glipizide 10 Mg Tabs (Glipizide) .Marland Kitchen.. 1 by mouth q am only 7)  Lisinopril 5 Mg Tabs (Lisinopril) .Marland Kitchen.. 1 by mouth once daily 8)  Ecotrin Low Strength 81 Mg Tbec (Aspirin) .Marland Kitchen.. 1 by mouth qd 9)  Hydrochlorothiazide 25 Mg Tabs (Hydrochlorothiazide) .Marland Kitchen.. 1po once daily 10)  Tramadol Hcl 50 Mg Tabs (Tramadol hcl) .Marland Kitchen.. 1 - 2 by mouth four times per day as needed for pain 11)  Aciphex 20 Mg Tbec (Rabeprazole sodium) .Marland Kitchen.. 1 by mouth once daily 12)  Coumadin 5 Mg Tabs (Warfarin sodium) .... Take as directed by coumadin clinic. 13)  Proair Hfa 108 (90 Base)  Mcg/act Aers (Albuterol sulfate) .... 2 puffs four times per day as needed for  shortness of breath 14)  Flexeril 5 Mg Tabs (Cyclobenzaprine hcl) .Marland Kitchen.. 1 by mouth three times a day as needed 15)  Promethazine Hcl 25 Mg Tabs (Promethazine hcl) .Marland Kitchen.. 1po q 6 hrs as needed nausea 16)  Miralax Powd (Polyethylene glycol 3350) .... Mix 1 capful (17 grams) in 8 oz water daily 17)  Phillips Milk of Magnesia 7.75 % Susp (Magnesium hydroxide) .... Take capful as needed 18)  Cymbalta 60 Mg Cpep (Duloxetine hcl) .Marland Kitchen.. 1 by mouth once daily 19)  Phentermine Hcl 37.5 Mg Caps (Phentermine hcl) .Marland Kitchen.. 1po once daily 20)  Dicyclomine Hcl 20 Mg Tabs (Dicyclomine hcl) .Marland Kitchen.. 1 by mouth every 6 hrs as needed for abdominal pain  Patient Instructions: 1)  Continue all previous medications as before this visit 2)  You will be contacted about the referral(s) to: echocardiogram 3)  Your refills were sent in that you requested 4)  You given the work note today 5)  You can drop off the FMLA form to be filled out at your convenience 6)  Please schedule a follow-up appointment in march 2012 for CPX with Labs and: 7)  HbgA1C prior to visit, ICD-9: 250.02 8)  Urine Microalbumin prior to visit, ICD-9: Prescriptions: COUMADIN 5 MG TABS (WARFARIN SODIUM) Take as directed by coumadin clinic.  #160 x 1   Entered and Authorized by:   Corwin Levins MD   Signed by:   Corwin Levins MD on 01/20/2011   Method used:   Electronically to        [facility]  [address] [id number] (ret[facility])       [address]       [place], [place]  [address]       Ph: [phone] or [phone]       Fax: [phone]   RxID:   [id number] LISINOPRIL 5 MG TABS (LISINOPRIL) 1 by mouth once daily  #90 x 3   Entered and Authorized by:   Corwin Levins MD   Signed by:   Corwin Levins MD on 01/20/2011   Method used:   [facility]ically to        [facility]  [address] [id number] ([facility]       [address]       [place], [place]   [address]       Ph: [phone] or [phone]       Fax: [phone]   RxID:   [id number]    Orders Added: 1)  Echo Referral [Echo] 2)  Est. Patient Level IV [[address]]

## 2011-02-01 ENCOUNTER — Ambulatory Visit (HOSPITAL_COMMUNITY): Payer: BC Managed Care – PPO | Attending: Internal Medicine

## 2011-02-01 DIAGNOSIS — E119 Type 2 diabetes mellitus without complications: Secondary | ICD-10-CM | POA: Insufficient documentation

## 2011-02-01 DIAGNOSIS — E785 Hyperlipidemia, unspecified: Secondary | ICD-10-CM | POA: Insufficient documentation

## 2011-02-01 DIAGNOSIS — I10 Essential (primary) hypertension: Secondary | ICD-10-CM | POA: Insufficient documentation

## 2011-02-01 DIAGNOSIS — I059 Rheumatic mitral valve disease, unspecified: Secondary | ICD-10-CM | POA: Insufficient documentation

## 2011-02-01 DIAGNOSIS — R0609 Other forms of dyspnea: Secondary | ICD-10-CM

## 2011-02-01 DIAGNOSIS — R0989 Other specified symptoms and signs involving the circulatory and respiratory systems: Secondary | ICD-10-CM

## 2011-02-01 DIAGNOSIS — R072 Precordial pain: Secondary | ICD-10-CM | POA: Insufficient documentation

## 2011-02-01 DIAGNOSIS — I079 Rheumatic tricuspid valve disease, unspecified: Secondary | ICD-10-CM | POA: Insufficient documentation

## 2011-02-02 NOTE — Medication Information (Signed)
Summary: [email or web address]  Anticoagulant Therapy  Managed by: Bethena Midget, RN, BSN Referring MD: Nona Dell PCP: Oliver Barre, MD Supervising MD: Clifton James MD, Cristal Deer Indication 1: Pulmonary Embolism and Infarction (ICD-415.1) Lab Used: [facility] Site: [facility] INR POC 1.4 INR RANGE 2 - 3  Dietary changes: no    Health status changes: no    Bleeding/hemorrhagic complications: no    Recent/future hospitalizations: no    Any changes in medication regimen? no    Recent/future dental: no  Any missed doses?: yes     Details: Missed last Thurs and Friday, due to running out of meds  Is patient compliant with meds? yes       Allergies: No Known Drug Allergies  Anticoagulation Management History:      The patient is taking warfarin and comes in today for a routine follow up visit.  Positive risk factors for bleeding include presence of serious comorbidities.  Negative risk factors for bleeding include an age less than 19 years old.  The bleeding index is 'intermediate risk'.  Positive CHADS2 values include History of HTN and History of Diabetes.  Negative CHADS2 values include Age > 63 years old.  The start date was 08/05/2005.  Anticoagulation responsible provider: Clifton James MD, Cristal Deer.  INR POC: 1.4.  Cuvette Lot#: [id number].  Exp: 01/2012.    Anticoagulation Management Assessment/Plan:      The patient's current anticoagulation dose is Coumadin 5 mg tabs: Take as directed by coumadin clinic..  The target INR is 2 - 3.  The next INR is due 02/08/2011.  Anticoagulation instructions were given to patient.  Results were reviewed/authorized by Bethena Midget, RN, BSN.  He was notified by Bethena Midget, RN, BSN.         Prior Anticoagulation Instructions: INR   Coumadin 5 mg tablets - Continue 2 tablets every day except 2.5 tablets on Tuesdays, Thursdays and Saturdays   Current Anticoagulation Instructions: INR 1.4 Today take extra 1 pill then resume 2 pills everyday except  2.5 pills  on Tuesdays, Thursdays and Saturdays. Recheck in 2 weeks.

## 2011-02-05 ENCOUNTER — Encounter: Payer: Self-pay | Admitting: Internal Medicine

## 2011-02-05 DIAGNOSIS — I2699 Other pulmonary embolism without acute cor pulmonale: Secondary | ICD-10-CM

## 2011-02-05 HISTORY — DX: Other pulmonary embolism without acute cor pulmonale: I26.99

## 2011-02-07 NOTE — H&P (Signed)
NAMEGEN, CLAGG               ACCOUNT NO.:  [id number]  MEDICAL RECORD NO.:  [id number]           PATIENT TYPE:  E  LOCATION:  [facility]                         FACILITY:  [facility]  PHYSICIAN:  Michiel Cowboy, MDDATE OF BIRTH:  02-01-1956  DATE OF ADMISSION:  01/17/2011 DATE OF DISCHARGE:                             HISTORY & PHYSICAL   PRIMARY CARE PROVIDER:  Dr. Oliver Barre.  CHIEF COMPLAINT:  Chest pain.  The patient is a 55 year old gentleman with history of hypertension, diabetes, and PE on chronic Coumadin.  The patient states over the past 2 weeks, he had been having chest pain which is worse with deep breathing and motion.  He states that at rest, the chest pain just feels like some discomfort but when he tries to make a movement or pull himself up or moves his arms or shoulders, he noticed that he has chest pain, also taking very deep breath causes the same painful sensation. At first, it was below his right breast, not below his left breast.  He has not had any associated shortness of breath.  No leg swelling.  No long travel.  He is therapeutic on his Coumadin.  Otherwise, no fevers, no chills, no cough.  REVIEW OF SYSTEMS:  Otherwise negative.  PAST MEDICAL HISTORY:  Hypertension, hyperlipidemia, diabetes mellitus type 2, history of PE 3 years ago for which he is still on Coumadin, history of obesity.  SOCIAL HISTORY:  The patient does not smoke or drink or abuse drugs.  FAMILY HISTORY:  Significant for father who died from prostate cancer and mother who died from CHF.  ALLERGIES:  No known drug allergies.  MEDICATIONS: 1. Aciphex 20 mg daily. 2. Aspirin 81 mg daily. 3. Coumadin 5 mg daily. 4. Cymbalta 60 mg daily. 5. Dicyclomine as needed for abdominal discomfort. 6. Flexor 5 mg daily. 7. Glipizide 10 mg daily. 8. Hydrochlorothiazide 25 mg daily. 9. Kombiglyze XR 04/999 mg daily. 10.Lisinopril 5 mg daily. 11.MiraLax dating 1.Phentermine 37.5 mg  daily. 13.ProAir as needed. 14.Promethazine as needed. 15.Simvastatin 40 mg daily. 16.Tramadol/acetaminophen as needed for pain. 17.Zetia 10 mg daily.  PHYSICAL EXAMINATION:  VITAL SIGNS:  Temperature 98.4, blood pressure 125/71, pulse 82, respiration 18, satting 96% on room air. GENERAL:  The patient appears to be in no acute distress. HEENT:  Head is nontraumatic.  Moist mucous membranes. LUNGS:  Clear to auscultation bilaterally but somewhat distant. HEART:  Regular rhythm but also distant. CHEST:  On chest wall, there is a point of tenderness in his costosternal angle.  This pain is very reproducible on palpation. ABDOMEN:  Obese but nontender and nondistended. LOWER EXTREMITIES:  Without clubbing, cyanosis, or edema, also obese. NEUROLOGIC:  Grossly intact. SKIN:  Clean, dry, and intact.  LABORATORY DATA:  White blood cell count of 11.3, hemoglobin 13.8, sodium 133, potassium 4.6, creatinine 1.45.  Cardiac enzymes unremarkable.  Chest x-ray unremarkable.  EKG showing no evidence of ischemia.  INR 2.2.  The patient is a 55 year old gentleman with history of hypertension, hyperlipidemia, diabetes, and history of PE, therapeutically on Coumadin who presents with very atypical chest pain which mostly is  consistent with musculoskeletal, in particular costochondritis type of pain.  We will admit him for further evaluation, especially given risk factors. For completion's sake, we will cycle cardiac enzymes.  Repeat EKG, check his fasting lipid panel, and hemoglobin A1c.  Hypertension.  Continue his home meds except hold his hydrochlorothiazide given somewhat low sodium.  Possible costochondritis.  We will give ibuprofen and pain management.  History of PE.  Continue Coumadin.  The patient does endorse pain is worse with inspiration.  It could be also secondary to costochondritis as well, but we will check a D-dimer.  Diabetes mellitus.  We will continue glipizide and hold  metformin.  Prophylaxis Protonix and the patient is already on Coumadin.  The patient is full code.     Michiel Cowboy, MD     AVD/MEDQ  D:  01/18/2011  T:  01/18/2011  Job:  [id number]  cc:   Corwin Levins, MD  Electronically Signed by Therisa Doyne MD on 02/07/2011 10:06:24 PM

## 2011-02-08 ENCOUNTER — Encounter: Payer: Self-pay | Admitting: Cardiology

## 2011-02-08 ENCOUNTER — Encounter (INDEPENDENT_AMBULATORY_CARE_PROVIDER_SITE_OTHER): Payer: BC Managed Care – PPO

## 2011-02-08 DIAGNOSIS — I2699 Other pulmonary embolism without acute cor pulmonale: Secondary | ICD-10-CM

## 2011-02-08 DIAGNOSIS — Z7901 Long term (current) use of anticoagulants: Secondary | ICD-10-CM

## 2011-02-08 LAB — CONVERTED CEMR LAB: POC INR: 3.2

## 2011-02-12 ENCOUNTER — Other Ambulatory Visit: Payer: BC Managed Care – PPO

## 2011-02-16 ENCOUNTER — Encounter (INDEPENDENT_AMBULATORY_CARE_PROVIDER_SITE_OTHER): Payer: Self-pay | Admitting: *Deleted

## 2011-02-16 ENCOUNTER — Other Ambulatory Visit: Payer: Self-pay | Admitting: Internal Medicine

## 2011-02-16 ENCOUNTER — Other Ambulatory Visit: Payer: BC Managed Care – PPO

## 2011-02-16 DIAGNOSIS — IMO0001 Reserved for inherently not codable concepts without codable children: Secondary | ICD-10-CM

## 2011-02-16 DIAGNOSIS — Z0389 Encounter for observation for other suspected diseases and conditions ruled out: Secondary | ICD-10-CM

## 2011-02-16 DIAGNOSIS — Z0000 Encounter for general adult medical examination without abnormal findings: Secondary | ICD-10-CM

## 2011-02-16 DIAGNOSIS — E1165 Type 2 diabetes mellitus with hyperglycemia: Secondary | ICD-10-CM

## 2011-02-16 LAB — LIPID PANEL
Cholesterol: 139 mg/dL (ref 0–200)
LDL Cholesterol: 74 mg/dL (ref 0–99)
Triglycerides: 132 mg/dL (ref 0.0–149.0)

## 2011-02-16 LAB — BASIC METABOLIC PANEL
CO2: 28 mEq/L (ref 19–32)
Chloride: 103 mEq/L (ref 96–112)
Creatinine, Ser: 1 mg/dL (ref 0.4–1.5)
Potassium: 5.3 mEq/L — ABNORMAL HIGH (ref 3.5–5.1)

## 2011-02-16 LAB — HEPATIC FUNCTION PANEL
ALT: 41 U/L (ref 0–53)
AST: 25 U/L (ref 0–37)
Alkaline Phosphatase: 59 U/L (ref 39–117)
Bilirubin, Direct: 0.1 mg/dL (ref 0.0–0.3)
Total Bilirubin: 0.6 mg/dL (ref 0.3–1.2)
Total Protein: 7 g/dL (ref 6.0–8.3)

## 2011-02-16 LAB — MICROALBUMIN / CREATININE URINE RATIO
Creatinine,U: 326.1 mg/dL
Microalb Creat Ratio: 0.9 mg/g (ref 0.0–30.0)

## 2011-02-16 LAB — CBC WITH DIFFERENTIAL/PLATELET
Basophils Absolute: 0 10*3/uL (ref 0.0–0.1)
Basophils Relative: 0.3 % (ref 0.0–3.0)
Eosinophils Absolute: 0.2 10*3/uL (ref 0.0–0.7)
Hemoglobin: 13.7 g/dL (ref 13.0–17.0)
Lymphocytes Relative: 21 % (ref 12.0–46.0)
Monocytes Relative: 5.4 % (ref 3.0–12.0)
Neutro Abs: 8 10*3/uL — ABNORMAL HIGH (ref 1.4–7.7)
Neutrophils Relative %: 71.8 % (ref 43.0–77.0)
RBC: 5.89 Mil/uL — ABNORMAL HIGH (ref 4.22–5.81)
RDW: 15.6 % — ABNORMAL HIGH (ref 11.5–14.6)

## 2011-02-16 LAB — URINALYSIS
Bilirubin Urine: NEGATIVE
Ketones, ur: NEGATIVE
Leukocytes, UA: NEGATIVE
Specific Gravity, Urine: 1.025 (ref 1.000–1.030)
Total Protein, Urine: NEGATIVE
Urine Glucose: NEGATIVE
pH: 6 (ref 5.0–8.0)

## 2011-02-16 LAB — HEMOGLOBIN A1C: Hgb A1c MFr Bld: 8.2 % — ABNORMAL HIGH (ref 4.6–6.5)

## 2011-02-16 LAB — PSA: PSA: 0.72 ng/mL (ref 0.10–4.00)

## 2011-02-16 NOTE — Medication Information (Signed)
Summary: [name]   Anticoagulant Therapy  Managed by: Georgina Pillion, PharmD Referring MD: Nona Dell PCP: Oliver Barre, MD Supervising MD: Daleen Squibb MD, Maisie Fus Indication 1: Pulmonary Embolism and Infarction (ICD-415.1) Lab Used: [facility] Site: [facility] INR POC 3.2 INR RANGE 2 - 3  Dietary changes: no    Health status changes: no    Bleeding/hemorrhagic complications: no    Recent/future hospitalizations: no    Any changes in medication regimen? no    Recent/future dental: no  Any missed doses?: no       Is patient compliant with meds? yes       Allergies: No Known Drug Allergies  Anticoagulation Management History:      Positive risk factors for bleeding include presence of serious comorbidities.  Negative risk factors for bleeding include an age less than 108 years old.  The bleeding index is 'intermediate risk'.  Positive CHADS2 values include History of HTN and History of Diabetes.  Negative CHADS2 values include Age > 76 years old.  The start date was 08/05/2005.  Anticoagulation responsible provider: Daleen Squibb MD, Maisie Fus.  INR POC: 3.2.  Exp: 01/2012.    Anticoagulation Management Assessment/Plan:      The patient's current anticoagulation dose is Coumadin 5 mg tabs: Take as directed by coumadin clinic..  The target INR is 2 - 3.  The next INR is due 03/01/2011.  Anticoagulation instructions were given to patient.  Results were reviewed/authorized by Georgina Pillion, PharmD.  He was notified by Georgina Pillion PharmD.         Prior Anticoagulation Instructions: INR 1.4 Today take extra 1 pill then resume 2 pills everyday except 2.5 pills  on Tuesdays, Thursdays and Saturdays. Recheck in 2 weeks.   Current Anticoagulation Instructions: Take 2 tablets tonight as scheduled and 2 tablets on Tuesday (instead of 2 1/2)--then continue current regimen of 2 tablets (10 mg) daily EXCEPT for 2 1/2 tablets (12.5 mg) on Tuesdays, Thursdays, and Saturdays.  INR 3.2

## 2011-02-18 ENCOUNTER — Encounter (INDEPENDENT_AMBULATORY_CARE_PROVIDER_SITE_OTHER): Payer: BC Managed Care – PPO | Admitting: Internal Medicine

## 2011-02-18 ENCOUNTER — Encounter: Payer: Self-pay | Admitting: Internal Medicine

## 2011-02-18 DIAGNOSIS — Z0000 Encounter for general adult medical examination without abnormal findings: Secondary | ICD-10-CM

## 2011-02-23 NOTE — Assessment & Plan Note (Signed)
Summary: cpx/lb   Vital Signs:  Patient profile:   55 year old male Height:      74 inches Weight:      355.13 pounds BMI:     45.76 O2 Sat:      94 % on Room air Temp:     99 degrees F oral Pulse rate:   80 / minute BP sitting:   110 / 70  (left arm) Cuff size:   large  Vitals Entered By: Zella Ball Ewing CMA Duncan Dull) (February 18, 2011 3:18 PM)  O2 Flow:  Room air  CC: Adult Physical/RE   Primary Care Provider:  Oliver Barre, MD  CC:  Adult Physical/RE.  History of Present Illness: here for wellness and f/u; did have recent fall at work , has known torn meniscus and is waiting for date for arthroscopic surgury, with crutches today;  needs FMLA filled out but will drop off the form;  Pt denies CP, worsening sob, doe, wheezing, orthopnea, pnd, worsening LE edema, palps, dizziness or syncope  Pt denies new neuro symptoms such as headache, facial or extremity weakness  Pt denies polydipsia, polyuria, or low sugar symptoms such as shakiness improved with eating.  Overall good compliance with meds, trying to follow low chol, DM diet, wt stable, little excercise however  CBG' s in the lower 100's.  No fever, wt loss, night sweats, loss of appetite or other constitutional symptoms  Overall good compliance with meds, and good tolerability.  Denies worsening depressive symptoms, suicidal ideation, or panic, though has some increased stressor with the knee pain lately.  Pt states good ability with ADL's, low fall risk, home safety reviewed and adequate, no significant change in hearing or vision, trying to follow lower chol diet, and occasionally active only with regular excercise, even less now with the left knee pain.  Problems Prior to Update: 1)  Chest Pain  (ICD-786.50) 2)  Dyspnea  (ICD-786.05) 3)  Preventive Health Care  (ICD-V70.0) 4)  Diab w/o Mention Comp Type Ii/uns Type Uncntrl  (ICD-250.02) 5)  Change in Bowels  (ICD-787.99) 6)  Dehydration  (ICD-276.51) 7)  Hypotension, Orthostatic   (ICD-458.0) 8)  Constipation  (ICD-564.00) 9)  Coumadin Therapy  (ICD-V58.61) 10)  Obesity  (ICD-278.00) 11)  Abdominal Pain-llq  (ICD-789.04) 12)  Abdominal Pain, Upper  (ICD-789.09) 13)  Back Pain  (ICD-724.5) 14)  Degenerative Joint Disease, Knees, Bilateral  (ICD-715.96) 15)  Wheezing  (ICD-786.07) 16)  Smoker  (ICD-305.1) 17)  Wheezing  (ICD-786.07) 18)  Leg Pain, Left  (ICD-729.5) 19)  Obesity, Morbid  (ICD-278.01) 20)  Hypogonadism, Male  (ICD-257.2) 21)  Uti  (ICD-599.0) 22)  Colonic Polyps, Hx of  (ICD-V12.72) 23)  Renal Insufficiency  (ICD-588.9) 24)  Depression  (ICD-311) 25)  Diverticulosis, Colon  (ICD-562.10) 26)  Hypertension  (ICD-401.9) 27)  Hyperlipidemia  (ICD-272.4) 28)  Diabetes Mellitus, Type II  (ICD-250.00)  Medications Prior to Update: 1)  Kombiglyze Xr 04-999 Mg Xr24h-Tab (Saxagliptin-Metformin) .Marland Kitchen.. 1po Once Daily 2)  Aurora Lancet Super Thin 30g  Misc (Lancets) .... Use 1 Stick As Directed Three Times A Day 3)  Simvastatin 40 Mg Tabs (Simvastatin) .Marland Kitchen.. 1 By Mouth Once Daily 4)  Onetouch Ultra Test  Strp (Glucose Blood) .... Use 1 Strip Three Times A Day 5)  Zetia 10 Mg Tabs (Ezetimibe) .... Take 1 Tablet By Mouth Once A Day 6)  Glipizide 10 Mg Tabs (Glipizide) .Marland Kitchen.. 1 By Mouth Q Am Only 7)  Lisinopril 5 Mg Tabs (Lisinopril) .Marland KitchenMarland KitchenMarland Kitchen  1 By Mouth Once Daily 8)  Ecotrin Low Strength 81 Mg  Tbec (Aspirin) .Marland Kitchen.. 1 By Mouth Qd 9)  Hydrochlorothiazide 25 Mg Tabs (Hydrochlorothiazide) .Marland Kitchen.. 1po Once Daily 10)  Tramadol Hcl 50 Mg Tabs (Tramadol Hcl) .Marland Kitchen.. 1 - 2 By Mouth Four Times Per Day As Needed For Pain 11)  Aciphex 20 Mg Tbec (Rabeprazole Sodium) .Marland Kitchen.. 1 By Mouth Once Daily 12)  Coumadin 5 Mg Tabs (Warfarin Sodium) .... Take As Directed By [facility]mad[facility] Clinic. 13)  Proair Hfa 108 (90 Base) Mcg/act Aers (Albuterol Sulfate) .... 2 Puffs Four Times Per Day As Needed For Shortness of Breath 14)  Flexeril 5 Mg Tabs (Cyclobenzaprine Hcl) .Marland Kitchen.. 1 By Mouth Three Times A Day As  Needed 15)  Promethazine Hcl 25 Mg Tabs (Promethazine Hcl) .Marland Kitchen.. 1po Q 6 Hrs As Needed Nausea 16)  Miralax  Powd (Polyethylene Glycol 3350) .... Mix 1 Capful (17 Grams) in 8 Oz Water Daily 17)  Phillips Milk of Magnesia 7.75 % Susp (Magnesium Hydroxide) .... Take Capful As Needed 18)  Cymbalta 60 Mg Cpep (Duloxetine Hcl) .Marland Kitchen.. 1 By Mouth Once Daily 19)  Phentermine Hcl 37.5 Mg Caps (Phentermine Hcl) .Marland Kitchen.. 1po Once Daily 20)  Dicyclomine Hcl 20 Mg Tabs (Dicyclomine Hcl) .Marland Kitchen.. 1 By Mouth Every 6 Hrs As Needed For Abdominal Pain  Current Medications (verified): 1)  Kombiglyze Xr 04-999 Mg Xr24h-Tab (Saxagliptin-Metformin) .Marland Kitchen.. 1po Once Daily 2)  Aurora Lancet Super Thin 30g  Misc (Lancets) .... Use 1 Stick As Directed Three Times A Day 3)  Simvastatin 40 Mg Tabs (Simvastatin) .Marland Kitchen.. 1 By Mouth Once Daily 4)  Onetouch Ultra Test  Strp (Glucose Blood) .... Use 1 Strip Three Times A Day 5)  Zetia 10 Mg Tabs (Ezetimibe) .... Take 1 Tablet By Mouth Once A Day 6)  Glipizide 10 Mg Tabs (Glipizide) .Marland Kitchen.. 1 By Mouth Two Times A Day 7)  Lisinopril 5 Mg Tabs (Lisinopril) .Marland Kitchen.. 1 By Mouth Once Daily 8)  Ecotrin Low Strength 81 Mg  Tbec (Aspirin) .Marland Kitchen.. 1 By Mouth Qd 9)  Hydrochlorothiazide 25 Mg Tabs (Hydrochlorothiazide) .Marland Kitchen.. 1po Once Daily 10)  Tramadol Hcl 50 Mg Tabs (Tramadol Hcl) .Marland Kitchen.. 1 - 2 By Mouth Four Times Per Day As Needed For Pain 11)  Aciphex 20 Mg Tbec (Rabeprazole Sodium) .Marland Kitchen.. 1 By Mouth Once Daily 12)  Coumadin 5 Mg Tabs (Warfarin Sodium) .... Take As Directed By [facility]mad[facility] Clinic. 13)  Proair Hfa 108 (90 Base) Mcg/act Aers (Albuterol Sulfate) .... 2 Puffs Four Times Per Day As Needed For Shortness of Breath 14)  Flexeril 5 Mg Tabs (Cyclobenzaprine Hcl) .Marland Kitchen.. 1 By Mouth Three Times A Day As Needed 15)  Promethazine Hcl 25 Mg Tabs (Promethazine Hcl) .Marland Kitchen.. 1po Q 6 Hrs As Needed Nausea 16)  Miralax  Powd (Polyethylene Glycol 3350) .... Mix 1 Capful (17 Grams) in 8 Oz Water Daily 17)  Phillips Milk of  Magnesia 7.75 % Susp (Magnesium Hydroxide) .... Take Capful As Needed 18)  Cymbalta 60 Mg Cpep (Duloxetine Hcl) .Marland Kitchen.. 1 By Mouth Once Daily 19)  Phentermine Hcl 37.5 Mg Caps (Phentermine Hcl) .Marland Kitchen.. 1po Once Daily 20)  Dicyclomine Hcl 20 Mg Tabs (Dicyclomine Hcl) .Marland Kitchen.. 1 By Mouth Every 6 Hrs As Needed For Abdominal Pain 21)  Metformin Hcl 500 Mg Xr24h-Tab (Metformin Hcl) .... 2 By Mouth Qam  Allergies (verified): No Known Drug Allergies  Past History:  Past Medical History: Last updated: 01/21/2010 Diabetes mellitus, type II Hyperlipidemia Hypertension kidney stones,knee surgery,ankel surgery,bone spurs,torn ligaent  Obstructive Sleep Apnea DVT- R Lower Extremity Pulmonary Embolus Diverticulosis, colon Depression Renal insufficiency Colonic polyps, hx of hypogonadism Obesity  Past Surgical History: Last updated: 03/02/2010 Knee Surgery- 05-08-1999 & May 07, 2002, cartilage damage Tonsillectomy and Adenoidectomy Ankle Surgery with bone spurs and torn tendon- 2000/05/07 bilateral  COLONOSCOPY 2005-DIVERTICULOSIS/HYPERPLASTIC POLYPS-DUE FOR F/U May 07, 2014 EGD 2008-CHRONIC DUODENITIS  Family History: Last updated: 01/21/2010 father with prostate cancer mother with HTN sister died with DM 2001/05/07 brother and 2 more sisters with DM No FH of Colon Cancer:  Social History: Last updated: 10/13/2010 Former Smoker Alcohol use-no Divorced 2 children work - back to work at [employer] since jan 2010 Daily Caffeine Use one per day Drug use-no  Risk Factors: Smoking Status: quit (10/05/2007)  Review of Systems  The patient denies anorexia, fever, vision loss, decreased hearing, hoarseness, chest pain, syncope, dyspnea on exertion, peripheral edema, prolonged cough, headaches, hemoptysis, abdominal pain, melena, hematochezia, severe indigestion/heartburn, hematuria, muscle weakness, suspicious skin lesions, transient blindness, depression, unusual weight change, abnormal bleeding, enlarged lymph  nodes, and angioedema.         all otherwise negative per pt -    Physical Exam  General:  alert and overweight-appearing.   Head:  normocephalic and atraumatic.   Eyes:  vision grossly intact, pupils equal, and pupils round.   Ears:  R ear normal and L ear normal.   Nose:  no external deformity and no nasal discharge.   Mouth:  no gingival abnormalities and pharynx pink and moist.   Neck:  supple and no masses.   Lungs:  normal respiratory effort and normal breath sounds.   Heart:  normal rate and regular rhythm.   Abdomen:  soft, non-tender, and normal bowel sounds.   Msk:  no chest wall tenderness today, and no joint tenderness and no joint swelling.  except for left knee trace effusion Extremities:  no edema, no erythema  Neurologic:  strength normal in all extremities, sensation intact to light touch, and DTRs symmetrical and normal.   Skin:  color normal and no rashes.   Psych:  not depressed appearing and slightly anxious.     Impression & Recommendations:  Problem # 1:  Preventive Health Care (ICD-V70.0) Overall doing well, age appropriate education and counseling updated, referral for preventive services and immunizations addressed, dietary counseling and smoking status adressed , most recent labs reviewed, ecg reviewed I have personally reviewed and have noted 1.The patient's medical and social history 2.Their use of alcohol, tobacco or illicit drugs 3.Their current medications and supplements 4. Functional ability including ADL's, fall risk, home safety risk, hearing & visual impairment  5.Diet and physical activities 6.Evidence for depression or mood disorders The patients weight, height, BMI  have been recorded in the chart I have made referrals, counseling and provided education to the patient based review of the above  Orders: EKG w/ Interpretation (93000)  Problem # 2:  DIABETES MELLITUS, TYPE II (ICD-250.00)  His updated medication list for this problem  includes:    Kombiglyze Xr 04-999 Mg Xr24h-tab (Saxagliptin-metformin) .Marland Kitchen... 1po once daily    Glipizide 10 Mg Tabs (Glipizide) .Marland Kitchen... 1 by mouth two times a day    Lisinopril 5 Mg Tabs (Lisinopril) .Marland Kitchen... 1 by mouth once daily    Ecotrin Low Strength 81 Mg Tbec (Aspirin) .Marland Kitchen... 1 by mouth qd    Metformin Hcl 500 Mg Xr24h-tab (Metformin hcl) .Marland Kitchen... 2 by mouth qam  Labs Reviewed: Creat: 1.0 (11/24/2010)    Reviewed HgBA1c results: 7.2 (11/24/2010)  7.8 (04/14/2010) uncontrolled- meds increased as above;  cont to monitor cbg's;  Pt to cont DM diet, excercise, wt control efforts; to check labs next visit  Complete Medication List: 1)  Kombiglyze Xr 04-999 Mg Xr24h-tab (Saxagliptin-metformin) .Marland Kitchen.. 1po once daily 2)  Aurora Lancet Super Thin 30g Misc (Lancets) .... Use 1 stick as directed three times a day 3)  Simvastatin 40 Mg Tabs (Simvastatin) .Marland Kitchen.. 1 by mouth once daily 4)  Onetouch Ultra Test Strp (Glucose blood) .... Use 1 strip three times a day 5)  Zetia 10 Mg Tabs (Ezetimibe) .... Take 1 tablet by mouth once a day 6)  Glipizide 10 Mg Tabs (Glipizide) .Marland Kitchen.. 1 by mouth two times a day 7)  Lisinopril 5 Mg Tabs (Lisinopril) .Marland Kitchen.. 1 by mouth once daily 8)  Ecotrin Low Strength 81 Mg Tbec (Aspirin) .Marland Kitchen.. 1 by mouth qd 9)  Hydrochlorothiazide 25 Mg Tabs (Hydrochlorothiazide) .Marland Kitchen.. 1po once daily 10)  Tramadol Hcl 50 Mg Tabs (Tramadol hcl) .Marland Kitchen.. 1 - 2 by mouth four times per day as needed for pain 11)  Aciphex 20 Mg Tbec (Rabeprazole sodium) .Marland Kitchen.. 1 by mouth once daily 12)  Coumadin 5 Mg Tabs (Warfarin sodium) .... Take as directed by coumadin clinic. 13)  Proair Hfa 108 (90 Base) Mcg/act Aers (Albuterol sulfate) .... 2 puffs four times per day as needed for shortness of breath 14)  Flexeril 5 Mg Tabs (Cyclobenzaprine hcl) .Marland Kitchen.. 1 by mouth three times a day as needed 15)  Promethazine Hcl 25 Mg Tabs (Promethazine hcl) .Marland Kitchen.. 1po q 6 hrs as needed nausea 16)  Miralax Powd (Polyethylene glycol 3350) ....  Mix 1 capful (17 grams) in 8 oz water daily 17)  Phillips Milk of Magnesia 7.75 % Susp (Magnesium hydroxide) .... Take capful as needed 18)  Cymbalta 60 Mg Cpep (Duloxetine hcl) .Marland Kitchen.. 1 by mouth once daily 19)  Phentermine Hcl 37.5 Mg Caps (Phentermine hcl) .Marland Kitchen.. 1po once daily 20)  Dicyclomine Hcl 20 Mg Tabs (Dicyclomine hcl) .Marland Kitchen.. 1 by mouth every 6 hrs as needed for abdominal pain 21)  Metformin Hcl 500 Mg Xr24h-tab (Metformin hcl) .... 2 by mouth qam  Patient Instructions: 1)  increase the glimeparide to two times a day  2)  start the metformin new prescription as prescribed 3)  Continue all previous medications as before this visit , including the kombyglyze 4)  You are cleared for surgury 5)  Please schedule a follow-up appointment in 6 months with: 6)  BMP prior to visit, ICD-9: 250.02 7)  Lipid Panel prior to visit, ICD-9: 8)  HbgA1C prior to visit, ICD-9: Prescriptions: GLIPIZIDE 10 MG TABS (GLIPIZIDE) 1 by mouth two times a day  #180 x 3   Entered and Authorized by:   Corwin Levins MD   Signed by:   Corwin Levins MD on 02/18/2011   Method used:   Print then Give to Patient   RxID:   [id number] METFORMIN HCL 500 MG XR24H-TAB (METFORMIN HCL) 2 by mouth qam  #180 x 0   Entered and Authorized by:   Corwin Levins MD   Signed by:   Corwin Levins MD on 02/18/2011   Method used:   Print then Give to Patient   RxID:   [id number]    Orders Added: 1)  EKG w/ Interpretation [93000]

## 2011-02-25 ENCOUNTER — Ambulatory Visit (INDEPENDENT_AMBULATORY_CARE_PROVIDER_SITE_OTHER): Payer: BC Managed Care – PPO | Admitting: *Deleted

## 2011-02-25 DIAGNOSIS — I2699 Other pulmonary embolism without acute cor pulmonale: Secondary | ICD-10-CM

## 2011-02-25 DIAGNOSIS — Z7901 Long term (current) use of anticoagulants: Secondary | ICD-10-CM

## 2011-02-25 LAB — POCT INR: INR: 3

## 2011-02-25 NOTE — Patient Instructions (Signed)
INR 3.0  Continue current dosage: 2 tablets (10mg ) every day, except for Tuesday, Thursday, and Saturday take 2.5 tablets (12.5mg ) Give [place] a call when you find out the plan for your surgery  Recheck INR in 4 weeks.

## 2011-02-26 ENCOUNTER — Encounter: Payer: Self-pay | Admitting: Internal Medicine

## 2011-02-26 ENCOUNTER — Ambulatory Visit (INDEPENDENT_AMBULATORY_CARE_PROVIDER_SITE_OTHER): Payer: BC Managed Care – PPO | Admitting: Internal Medicine

## 2011-02-26 VITALS — BP 108/78 | HR 67 | Temp 98.0°F | Ht 74.0 in | Wt 357.0 lb

## 2011-02-26 DIAGNOSIS — E119 Type 2 diabetes mellitus without complications: Secondary | ICD-10-CM

## 2011-02-26 DIAGNOSIS — I10 Essential (primary) hypertension: Secondary | ICD-10-CM

## 2011-02-26 DIAGNOSIS — E785 Hyperlipidemia, unspecified: Secondary | ICD-10-CM

## 2011-02-26 MED ORDER — GLIPIZIDE 10 MG PO TABS: ORAL_TABLET | ORAL | Status: DC

## 2011-02-26 NOTE — Assessment & Plan Note (Addendum)
Sugars overcontrolled by hx with several low sugars this past wk about noontime -   To decrease the glipizide to 1/2 in the am, 1 in the PM;  Continue all other medications as before , Pt to cont current meds, diet and activity, wt control efforts, and check labs next visit approx 6 mo, or sooner if needed  Lab Results  Component Value Date   HGBA1C 8.2* 02/16/2011

## 2011-02-26 NOTE — Patient Instructions (Signed)
Please decrease the glipizide to 1/2 pill in the AM, and continue the whole pill in the PM Continue all other medications as before  You should still be cleared for surgury as before Please return in 6 mo with labs done 3-5 days prior:  Hgba1c, lipids, and BMP

## 2011-02-26 NOTE — Assessment & Plan Note (Signed)
stable overall by hx and exam, ok to continue meds/tx as is   Lab Results  Component Value Date   WBC 11.2* 02/16/2011   HGB 13.7 02/16/2011   HCT 42.6 02/16/2011   PLT 240.0 02/16/2011   CHOL 139 02/16/2011   TRIG 132.0 02/16/2011   HDL 38.60* 02/16/2011   LDLDIRECT 163.5 03/24/2010   ALT 41 02/16/2011   AST 25 02/16/2011   NA 139 02/16/2011   K 5.3* 02/16/2011   CL 103 02/16/2011   CREATININE 1.0 02/16/2011   BUN 22 02/16/2011   CO2 28 02/16/2011   TSH 2.43 02/16/2011   PSA 0.72 02/16/2011   INR 3.0 02/25/2011   HGBA1C 8.2* 02/16/2011   MICROALBUR 3.0* 02/16/2011

## 2011-02-26 NOTE — Assessment & Plan Note (Signed)
stable overall by hx and exam, ok to continue meds/tx as is   Lab Results  Component Value Date   LDLCALC 74 02/16/2011

## 2011-02-26 NOTE — Progress Notes (Signed)
Subjective:    Patient ID: [name], male    DOB: August 26, 1956, 55 y.o.   MRN: [id number]  HPI  Here to f/u;  Was last seen here approx 1 wk ago, with meds increased for uncontrolled DM, and unfortunately with several days in the past wk with low sugar symptoms about noontime.  Pt denies chest pain, increased sob or doe, wheezing, orthopnea, PND, increased LE swelling, palpitations, dizziness or syncope.    Pt denies new neurological symptoms such as new headache, or facial or extremity weakness or numbness.   Pt denies polydipsia, polyuria   Pt states overall good compliance with meds, trying to follow lower cholesterol, diabetic diet, wt overall stable but little exercise however. .   Pt denies fever, wt loss, night sweats, loss of appetite, or other constitutional symptoms  Overall good compliance with treatment, and good medicine tolerability.   Past Medical History  Diagnosis Date  . Diabetes mellitus     type II  . Hyperlipidemia   . Hypertension   . Kidney stones   . Obstructive sleep apnea   . DVT, lower extremity     right  . Embolus     pulmonary  . Diverticulosis of colon   . Depression   . Renal insufficiency   . Hx of colonic polyps   . Hypogonadism male   . Obesity   . BACK PAIN 12/24/2009  . COLONIC POLYPS, HX OF 10/05/2007  . DEGENERATIVE JOINT DISEASE, KNEES, BILATERAL 11/17/2009  . DEPRESSION 10/05/2007  . DIABETES MELLITUS, TYPE II 06/30/2007  . DIVERTICULOSIS, COLON 10/05/2007  . HYPERLIPIDEMIA 06/30/2007  . HYPERTENSION 06/30/2007  . HYPOGONADISM, MALE 03/04/2008  . HYPOTENSION, ORTHOSTATIC 01/29/2010  . OBESITY, MORBID 03/04/2008  . RENAL INSUFFICIENCY 10/05/2007   Past Surgical History  Procedure Date  . Knee surgury 2000 & 2003     cartilage damage  . Tonsillectomy and adenoidectomy   . Ankle surgury 2001 bilateral    with bone spurs and torn tendon  . Edg 2008    chronic Duodenitis    reports that he has quit smoking. He does not have any  smokeless tobacco history on file. He reports that he does not drink alcohol or use illicit drugs. family history includes Diabetes in his brother and sister; Hypertension in his mother; and Prostate cancer in his father. No Known Allergies    Review of Systems Review of Systems  Constitutional: Negative for diaphoresis and unexpected weight change.  HENT: Negative for drooling and tinnitus.   Eyes: Negative for photophobia and visual disturbance.  Respiratory: Negative for choking and stridor.   Gastrointestinal: Negative for vomiting and blood in stool.  Genitourinary: Negative for hematuria and decreased urine volume.  Musculoskeletal: Negative for gait problem.  Skin: Negative for color change and wound.  Neurological: Negative for tremors and numbness.  Psychiatric/Behavioral: Negative for decreased concentration. The patient is not hyperactive.       Objective:   Physical Exam Physical Exam  Constitutional: Pt appears well-developed and well-nourished.  HENT: Head: Normocephalic.  Right Ear: External ear normal.  Left Ear: External ear normal.  Eyes: Conjunctivae and EOM are normal. Pupils are equal, round, and reactive to light.  Neck: Normal range of motion. Neck supple.  Cardiovascular: Normal rate and regular rhythm.   Pulmonary/Chest: Effort normal and breath sounds normal.  Abd:  Soft, NT, non-distended, + BS Neurological: Pt is alert. No cranial nerve deficit.  Skin: Skin is warm. No erythema.  Psychiatric: Pt behavior  is normal. Thought content normal.         Assessment & Plan:

## 2011-03-01 ENCOUNTER — Encounter: Payer: BC Managed Care – PPO | Admitting: *Deleted

## 2011-03-16 ENCOUNTER — Encounter (HOSPITAL_COMMUNITY)
Admission: RE | Admit: 2011-03-16 | Discharge: 2011-03-16 | Disposition: A | Discharge status: Home / Self Care | Payer: Worker's Compensation | Source: Ambulatory Visit | Attending: Orthopedic Surgery | Admitting: Orthopedic Surgery

## 2011-03-16 LAB — SURGICAL PCR SCREEN
MRSA, PCR: NEGATIVE
Staphylococcus aureus: NEGATIVE

## 2011-03-16 LAB — COMPREHENSIVE METABOLIC PANEL
ALT: 22 U/L (ref 0–53)
Alkaline Phosphatase: 66 U/L (ref 39–117)
BUN: 18 mg/dL (ref 6–23)
CO2: 30 mEq/L (ref 19–32)
GFR calc non Af Amer: >60 mL/min (ref >60)
Glucose, Bld: 122 mg/dL — ABNORMAL HIGH (ref 70–99)
Potassium: 4.8 mEq/L (ref 3.5–5.1)
Sodium: 140 mEq/L (ref 135–145)

## 2011-03-16 LAB — URINALYSIS, ROUTINE W REFLEX MICROSCOPIC
Hgb urine dipstick: NEGATIVE
Protein, ur: NEGATIVE mg/dL
Urobilinogen, UA: 1 mg/dL (ref 0.0–1.0)

## 2011-03-16 LAB — CBC
HCT: 44.1 % (ref 39.0–52.0)
Hemoglobin: 14.2 g/dL (ref 13.0–17.0)
MCHC: 32.2 g/dL (ref 30.0–36.0)
MCV: 70.2 fL — ABNORMAL LOW (ref 78.0–100.0)
WBC: 9.2 10*3/uL (ref 4.0–10.5)

## 2011-03-16 LAB — PROTIME-INR: Prothrombin Time: 17.9 seconds — ABNORMAL HIGH (ref 11.6–15.2)

## 2011-03-16 LAB — APTT: aPTT: 35 seconds (ref 24–37)

## 2011-03-18 ENCOUNTER — Ambulatory Visit (HOSPITAL_COMMUNITY)
Admission: RE | Admit: 2011-03-18 | Discharge: 2011-03-18 | Disposition: A | Discharge status: Home / Self Care | Payer: Worker's Compensation | Source: Ambulatory Visit | Attending: Orthopedic Surgery | Admitting: Orthopedic Surgery

## 2011-03-18 DIAGNOSIS — E119 Type 2 diabetes mellitus without complications: Secondary | ICD-10-CM | POA: Insufficient documentation

## 2011-03-18 DIAGNOSIS — Z79899 Other long term (current) drug therapy: Secondary | ICD-10-CM | POA: Insufficient documentation

## 2011-03-18 DIAGNOSIS — X58XXXA Exposure to other specified factors, initial encounter: Secondary | ICD-10-CM | POA: Insufficient documentation

## 2011-03-18 DIAGNOSIS — Y990 Civilian activity done for income or pay: Secondary | ICD-10-CM | POA: Insufficient documentation

## 2011-03-18 DIAGNOSIS — I10 Essential (primary) hypertension: Secondary | ICD-10-CM | POA: Insufficient documentation

## 2011-03-18 DIAGNOSIS — S83289A Other tear of lateral meniscus, current injury, unspecified knee, initial encounter: Secondary | ICD-10-CM | POA: Insufficient documentation

## 2011-03-18 DIAGNOSIS — Z7901 Long term (current) use of anticoagulants: Secondary | ICD-10-CM | POA: Insufficient documentation

## 2011-03-18 DIAGNOSIS — G4733 Obstructive sleep apnea (adult) (pediatric): Secondary | ICD-10-CM | POA: Insufficient documentation

## 2011-03-18 DIAGNOSIS — IMO0002 Reserved for concepts with insufficient information to code with codable children: Secondary | ICD-10-CM | POA: Insufficient documentation

## 2011-03-18 DIAGNOSIS — Z7982 Long term (current) use of aspirin: Secondary | ICD-10-CM | POA: Insufficient documentation

## 2011-03-18 DIAGNOSIS — M1710 Unilateral primary osteoarthritis, unspecified knee: Secondary | ICD-10-CM | POA: Insufficient documentation

## 2011-03-25 ENCOUNTER — Ambulatory Visit (INDEPENDENT_AMBULATORY_CARE_PROVIDER_SITE_OTHER): Payer: Worker's Compensation | Admitting: *Deleted

## 2011-03-25 DIAGNOSIS — I2699 Other pulmonary embolism without acute cor pulmonale: Secondary | ICD-10-CM

## 2011-03-30 NOTE — Op Note (Signed)
NAMERANBIR, CHEW               ACCOUNT NO.:  [id number]  MEDICAL RECORD NO.:  [id number]           PATIENT TYPE:  O  LOCATION:  [facility]                         FACILITY:  [facility]  PHYSICIAN:  Vania Rea. Tarron Krolak, M.D.  DATE OF BIRTH:  July 06, 1956  DATE OF PROCEDURE:  03/18/2011 DATE OF DISCHARGE:  03/18/2011                              OPERATIVE REPORT   PREOPERATIVE DIAGNOSES: 1. Left knee medial and lateral meniscal tears. 2. Left knee arthrosis.  POSTOPERATIVE DIAGNOSES: 1. Left knee medial and lateral meniscal tears. 2. Left knee arthrosis. 3. Chondromalacia in all three compartments. 4. Left knee synovitis.  PROCEDURE: 1. Left knee diagnostic arthroscopy. 2. Partial medial and partial lateral meniscectomies. 3. Extensive synovectomy 4. Chondroplasty in all three compartments of the left knee surgery.  SURGEON:  Vania Rea. Izac Faulkenberry, MD  ASSISTANT:  Lucita Lora. Shuford, PA-C  ANESTHESIA:  LMA general as well as a local.  TOURNIQUET TIME:  None was used.  ESTIMATED BLOOD LOSS:  Minimal.  DRAINS:  None.  HISTORY:  [name] is a 55 year old gentleman who had a work-related left knee injury with subsequently increased pain, swelling, mechanical symptoms.  The examination showed a painful arc of motion with tenderness induced along the joint lines.  His radiographs did show diffuse degenerative changes and MRI scan confirmed tricompartmental arthrosis as well as complex tears of both medial and lateral menisci. Due to his ongoing pain, swelling, and mechanical symptoms, he is brought to the operating at this time for planned right knee arthroscopy as described below.  Preoperatively, I counseled Mr. Valletta on treatment options as well as risks versus benefits thereof.  Possible surgical complications were reviewed including potential for bleeding, infection, neurovascular injury, DVT, PE as well as persistent pain.  I also discussed that arthroscopic surgery will not change  the underlying osteoarthrosis.  He understands and accepts and agrees with our planned procedure.  PROCEDURE IN DETAIL:  After undergoing routine preop evaluation, the patient received prophylactic biotics.  Brought to the operating room and placed supine on the operating table, and underwent smooth induction of an LMA general anesthesia.  The left leg was placed in leg holder, and left lower extremity was then sterilely prepped and draped in standard fashion.  Time-out was called.  Standard arthroscopy portals were established.  Diagnostic arthroscopy was performed.  The suprasellar pouch and gutter showed diffuse synovitis and extensive synovectomy was performed particularly at the anterior chamber. Patellofemoral joint showed broad grade 3 chondromalacia of the trochlear groove, grade 2 on the patella.  This area was debrided with a shaver.  Intercondylar notch showed rimming osteophytes with some degenerative attenuation of the ACL with no obvious ACL rupture. Medially, there was advanced arthrosis with broad areas of exposed subchondral bone on both medial femoral condyle and medial tibial plateau.  In addition, there was a very and extensive and complex tear of the medial meniscus.  The meniscus was trimmed back to a stable margin with a basket particularly involving the middle posterior thirds, and then a shaver was used for final contouring and removal of the meniscal fragments.  Laterally again, we found  advanced arthrosis with broad expose of chondral bone on both lateral femoral condyle and tibial plateau and in addition, an extensive tear of the posterior half lateral meniscus and again, this was trimmed back to stable margin with a basket, and shaver was used for final contouring and removal of the meniscal fragments.  We then completed the synovectomy and debridement. Fluid and instruments were then removed.  Combination of Marcaine and morphine was instilled in the knee  joint.  Additional Marcaine about the portals.  Portals were closed with Steri-Strips.  Bulky dry dressing wrapped about the left knee and leg.  Left leg was wrapped with an Ace bandage.  The patient was then awakened, extubated, and taken to the recovery room in stable condition.     Vania Rea. August Longest, M.D.     KMS/MEDQ  D:  03/18/2011  T:  03/19/2011  Job:  [id number]  Electronically Signed by Francena Hanly M.D. on 03/30/2011 06:24:44 PM

## 2011-04-05 ENCOUNTER — Ambulatory Visit: Payer: Worker's Compensation | Admitting: Internal Medicine

## 2011-04-06 ENCOUNTER — Other Ambulatory Visit: Payer: Self-pay | Admitting: Internal Medicine

## 2011-04-06 DIAGNOSIS — IMO0001 Reserved for inherently not codable concepts without codable children: Secondary | ICD-10-CM

## 2011-04-06 DIAGNOSIS — Z0000 Encounter for general adult medical examination without abnormal findings: Secondary | ICD-10-CM

## 2011-04-06 DIAGNOSIS — Z1289 Encounter for screening for malignant neoplasm of other sites: Secondary | ICD-10-CM

## 2011-04-08 ENCOUNTER — Encounter: Payer: Self-pay | Admitting: Internal Medicine

## 2011-04-08 ENCOUNTER — Other Ambulatory Visit (INDEPENDENT_AMBULATORY_CARE_PROVIDER_SITE_OTHER): Payer: BC Managed Care – PPO

## 2011-04-08 ENCOUNTER — Telehealth: Payer: Self-pay

## 2011-04-08 ENCOUNTER — Ambulatory Visit (INDEPENDENT_AMBULATORY_CARE_PROVIDER_SITE_OTHER): Payer: BC Managed Care – PPO | Admitting: *Deleted

## 2011-04-08 ENCOUNTER — Ambulatory Visit (INDEPENDENT_AMBULATORY_CARE_PROVIDER_SITE_OTHER): Payer: Worker's Compensation | Admitting: Internal Medicine

## 2011-04-08 VITALS — BP 112/68 | HR 98 | Temp 98.3°F | Ht 74.0 in | Wt 355.8 lb

## 2011-04-08 DIAGNOSIS — I2699 Other pulmonary embolism without acute cor pulmonale: Secondary | ICD-10-CM

## 2011-04-08 DIAGNOSIS — E119 Type 2 diabetes mellitus without complications: Secondary | ICD-10-CM

## 2011-04-08 DIAGNOSIS — R300 Dysuria: Secondary | ICD-10-CM | POA: Insufficient documentation

## 2011-04-08 DIAGNOSIS — Z7901 Long term (current) use of anticoagulants: Secondary | ICD-10-CM | POA: Insufficient documentation

## 2011-04-08 DIAGNOSIS — Z0000 Encounter for general adult medical examination without abnormal findings: Secondary | ICD-10-CM | POA: Insufficient documentation

## 2011-04-08 DIAGNOSIS — I10 Essential (primary) hypertension: Secondary | ICD-10-CM

## 2011-04-08 LAB — URINALYSIS, ROUTINE W REFLEX MICROSCOPIC
Nitrite: POSITIVE
Specific Gravity, Urine: >=1.03 (ref 1.000–1.030)
Total Protein, Urine: 100
pH: 5.5 (ref 5.0–8.0)

## 2011-04-08 LAB — CBC WITH DIFFERENTIAL/PLATELET
Basophils Absolute: 0 10*3/uL (ref 0.0–0.1)
HCT: 42.5 % (ref 39.0–52.0)
Lymphocytes Relative: 7.4 % — ABNORMAL LOW (ref 12.0–46.0)
Lymphs Abs: 2 10*3/uL (ref 0.7–4.0)
Monocytes Relative: 7.2 % (ref 3.0–12.0)
Neutrophils Relative %: 85.3 % — ABNORMAL HIGH (ref 43.0–77.0)
Platelets: 200 10*3/uL (ref 150.0–400.0)
RDW: 14.9 % — ABNORMAL HIGH (ref 11.5–14.6)

## 2011-04-08 LAB — BASIC METABOLIC PANEL
CO2: 26 mEq/L (ref 19–32)
Calcium: 9 mg/dL (ref 8.4–10.5)
Glucose, Bld: 249 mg/dL — ABNORMAL HIGH (ref 70–99)
Sodium: 132 mEq/L — ABNORMAL LOW (ref 135–145)

## 2011-04-08 LAB — LIPID PANEL: HDL: 42.2 mg/dL (ref >39.00)

## 2011-04-08 LAB — POCT INR: INR: 2.5

## 2011-04-08 LAB — HEMOGLOBIN A1C: Hgb A1c MFr Bld: 9.4 % — ABNORMAL HIGH (ref 4.6–6.5)

## 2011-04-08 MED ORDER — CEFTRIAXONE SODIUM 1 G IJ SOLR
1.0000 g | INTRAMUSCULAR | Status: DC
  Administered 2011-04-08 – 2011-04-09 (×2): 1 g via INTRAMUSCULAR

## 2011-04-08 MED ORDER — LEVOFLOXACIN 500 MG PO TABS: 500.0000 mg | ORAL_TABLET | Freq: Every day | ORAL | Status: AC

## 2011-04-08 NOTE — Telephone Encounter (Signed)
Called the patient on his mobile [phone] and home number [phone] left message to call back

## 2011-04-08 NOTE — Assessment & Plan Note (Signed)
prob improved since last visit on current meds:  To check labs today, cont med and diet and wt loss efforts  Lab Results  Component Value Date   HGBA1C 8.2* 02/16/2011

## 2011-04-08 NOTE — Progress Notes (Signed)
Addended by: Scharlene Gloss on: 04/08/2011 03:47 PM   Modules accepted: Orders

## 2011-04-08 NOTE — Progress Notes (Signed)
Subjective:    Patient ID: [name], male    DOB: [birth year], 55 y.o.   MRN: [id number]  HPI  Here for acute visit - c/o 3 days onset lower abd pain, dysuria, urinary freq and some incontinence, assoc with f/c and gross hematuria (also on coumadin - has coumadin appt later today);  No flank or back pain, n/v, and last UTI yrs ago.  Has hx of DM with last a1c approx 8.05 Feb 2011 and meds adjusted.  Pt denies chest pain, increased sob or doe, wheezing, orthopnea, PND, increased LE swelling, palpitations, dizziness or syncope.  Pt denies new neurological symptoms such as new headache, or facial or extremity weakness or numbness   Pt denies polydipsia, polyuria, or low sugar symptoms such as weakness or confusion improved with po intake.  Pt states overall good compliance with meds, trying to follow lower cholesterol, diabetic diet, wt overall stable but little exercise however - has been out of work since left knee surgury since mar 6, with plan to return to work based on improvement with PT about June 1.   Pt denies fever, wt loss, night sweats, loss of appetite, or other constitutional symptoms except with current symptoms.  Overall good compliance with treatment, and good medicine tolerability. Past Medical History  Diagnosis Date  . Diabetes mellitus     type II  . Hyperlipidemia   . Hypertension   . Kidney stones   . Obstructive sleep apnea   . DVT, lower extremity     right  . Embolus     pulmonary  . Diverticulosis of colon   . Depression   . Renal insufficiency   . Hx of colonic polyps   . Hypogonadism male   . Obesity   . BACK PAIN 12/24/2009  . COLONIC POLYPS, HX OF 10/05/2007  . DEGENERATIVE JOINT DISEASE, KNEES, BILATERAL 11/17/2009  . DEPRESSION 10/05/2007  . DIABETES MELLITUS, TYPE II 06/30/2007  . DIVERTICULOSIS, COLON 10/05/2007  . HYPERLIPIDEMIA 06/30/2007  . HYPERTENSION 06/30/2007  . HYPOGONADISM, MALE 03/04/2008  . HYPOTENSION, ORTHOSTATIC 01/29/2010  . OBESITY,  MORBID 03/04/2008  . RENAL INSUFFICIENCY 10/05/2007  . Long term current use of anticoagulant 04/08/2011   Past Surgical History  Procedure Date  . Knee surgury 2000 & 2003     cartilage damage  . Tonsillectomy and adenoidectomy   . Ankle surgury 2001 bilateral    with bone spurs and torn tendon  . Edg 2008    chronic Duodenitis    reports that he has quit smoking. He does not have any smokeless tobacco history on file. He reports that he does not drink alcohol or use illicit drugs. family history includes Diabetes in his brother and sister; Hypertension in his mother; and Prostate cancer in his father. No Known Allergies Current Outpatient Prescriptions on File Prior to Visit  Medication Sig Dispense Refill  . albuterol (PROAIR HFA) 108 (90 BASE) MCG/ACT inhaler Inhale 2 puffs into the lungs every 4 (four) hours as needed. For shortness of breath       . aspirin (ECOTRIN LOW STRENGTH) 81 MG EC tablet Take 81 mg by mouth daily.        Jerrell Belfast Lancet Super Thin 30G MISC Use 1 stick as directed three times a day       . DULoxetine (CYMBALTA) 60 MG capsule Take 60 mg by mouth daily.        Marland Kitchen ezetimibe (ZETIA) 10 MG tablet Take 10 mg by mouth daily.        Marland Kitchen  glipiZIDE (GLUCOTROL) 10 MG tablet 1/2 po in the AM, and 1 po in the PM  45 tablet  11  . glucose blood (ONE TOUCH ULTRA TEST) test strip 1 each by Other route. 1 strip three times a day       . hydrochlorothiazide 25 MG tablet Take 25 mg by mouth daily.        Marland Kitchen lisinopril (PRINIVIL,ZESTRIL) 5 MG tablet Take 5 mg by mouth daily.        . Saxagliptin-Metformin (KOMBIGLYZE XR) 04-999 MG TB24 Take by mouth daily.        . traMADol (ULTRAM) 50 MG tablet Take 50 mg by mouth. 1-2 by mouth four times per day as needed for pain       . warfarin (COUMADIN) 5 MG tablet Take by mouth as directed.        Marland Kitchen DISCONTD: cyclobenzaprine (FLEXERIL) 5 MG tablet Take 5 mg by mouth 3 (three) times daily as needed.        Marland Kitchen DISCONTD: dicyclomine (BENTYL) 20  MG tablet Take 20 mg by mouth every 6 (six) hours. As needed for abdominal pain       . DISCONTD: magnesium hydroxide (PHILLIPS MILK OF MAGNESIA) 400 MG/5ML suspension Take by mouth daily as needed.        Marland Kitchen DISCONTD: phentermine 37.5 MG capsule Take 37.5 mg by mouth daily.        Marland Kitchen DISCONTD: polyethylene glycol (MIRALAX) powder Take 17 g by mouth. Mix 1 capful (17 grams) in 8oz. Of water daily       . DISCONTD: promethazine (PHENERGAN) 25 MG tablet Take 25 mg by mouth every 6 (six) hours as needed. For nausea       . DISCONTD: RABEprazole (ACIPHEX) 20 MG tablet Take 20 mg by mouth daily.         Review of Systems .All otherwise neg per pt     Objective:   Physical Exam BP 112/68  Pulse 98  Temp(Src) 98.3 F (36.8 C) (Oral)  Ht 6\' 2"  (1.88 m)  Wt 355 lb 12 oz (161.367 kg)  BMI 45.68 kg/m2  SpO2 90% Physical Exam  VS noted, somewhat tachypnic, mild to mod ill appearing, fatigued, not confused, able to ascend exam table Constitutional: Pt appears well-developed and well-nourished.  HENT: Head: Normocephalic.  Right Ear: External ear normal.  Left Ear: External ear normal.  Eyes: Conjunctivae and EOM are normal. Pupils are equal, round, and reactive to light.  Neck: Normal range of motion. Neck supple.  Cardiovascular: Normal rate and regular rhythm.   Pulmonary/Chest: Effort normal and breath sounds normal.  Abd:  Soft, non-distended, + BS, mild lower mid abdomen without guarding or rebound Neurological: Pt is alert. No cranial nerve deficit.  Skin: Skin is warm. No erythema.  Psychiatric: Pt behavior is normal. Thought content normal.  Left knee no change without new effusion, tender       Assessment & Plan:

## 2011-04-08 NOTE — Assessment & Plan Note (Signed)
stable overall by hx and exam, most recent lab reviewed with pt, and pt to continue medical treatment as before  BP Readings from Last 3 Encounters:  04/08/11 112/68  02/26/11 108/78  02/18/11 110/70   Lab Results  Component Value Date   WBC 9.2 03/16/2011   HGB 14.2 03/16/2011   HCT 44.1 03/16/2011   PLT 238 03/16/2011   CHOL 139 02/16/2011   TRIG 132.0 02/16/2011   HDL 38.60* 02/16/2011   LDLDIRECT 163.5 03/24/2010   ALT 22 03/16/2011   AST 22 03/16/2011   NA 140 03/16/2011   K 4.8 03/16/2011   CL 101 03/16/2011   CREATININE 1.14 03/16/2011   BUN 18 03/16/2011   CO2 30 03/16/2011   TSH 2.43 02/16/2011   PSA 0.72 02/16/2011   INR 1.9 03/25/2011   HGBA1C 8.2* 02/16/2011   MICROALBUR 3.0* 02/16/2011

## 2011-04-08 NOTE — Patient Instructions (Addendum)
You had the rocephin shot today (antibiotic) Take all new medications as prescribed - the levaquin antibiotic for home Continue all other medications as before Please go to LAB in the Basement for the blood and/or urine tests to be done today Please call the number on the [facility] (the [employer] System) for results of testing in 2-3 days Please return in 4 mo with Lab testing done 3-5 days before Your FMLA form will be filled out Please keep your appointments with your specialists as you have planned - the coumadin clinic today

## 2011-04-08 NOTE — Assessment & Plan Note (Signed)
New onset x 3 days with f/c, prob UTI with mild systemic symptoms; I dont think needs hospn, but will need rocephin 1 gm, and levaquin course for home after urine studies (although he states he's having some difficutly giving specimen at this time);  Has appt for coumadin check later today - important with the gross hematuria as well

## 2011-04-09 ENCOUNTER — Telehealth: Payer: Self-pay

## 2011-04-09 ENCOUNTER — Ambulatory Visit (INDEPENDENT_AMBULATORY_CARE_PROVIDER_SITE_OTHER): Payer: BC Managed Care – PPO | Admitting: Internal Medicine

## 2011-04-09 ENCOUNTER — Other Ambulatory Visit (INDEPENDENT_AMBULATORY_CARE_PROVIDER_SITE_OTHER): Payer: BC Managed Care – PPO

## 2011-04-09 ENCOUNTER — Encounter: Payer: Self-pay | Admitting: Internal Medicine

## 2011-04-09 VITALS — BP 102/68 | HR 83 | Temp 97.7°F | Ht 74.0 in | Wt 354.0 lb

## 2011-04-09 DIAGNOSIS — R300 Dysuria: Secondary | ICD-10-CM

## 2011-04-09 DIAGNOSIS — I10 Essential (primary) hypertension: Secondary | ICD-10-CM

## 2011-04-09 DIAGNOSIS — Z0279 Encounter for issue of other medical certificate: Secondary | ICD-10-CM

## 2011-04-09 DIAGNOSIS — N259 Disorder resulting from impaired renal tubular function, unspecified: Secondary | ICD-10-CM

## 2011-04-09 DIAGNOSIS — N390 Urinary tract infection, site not specified: Secondary | ICD-10-CM

## 2011-04-09 LAB — CBC WITH DIFFERENTIAL/PLATELET
Basophils Relative: 0.1 % (ref 0.0–3.0)
Eosinophils Relative: 0.3 % (ref 0.0–5.0)
Hemoglobin: 12.8 g/dL — ABNORMAL LOW (ref 13.0–17.0)
Lymphocytes Relative: 5.7 % — ABNORMAL LOW (ref 12.0–46.0)
Monocytes Relative: 5.7 % (ref 3.0–12.0)
Neutro Abs: 21.6 10*3/uL — ABNORMAL HIGH (ref 1.4–7.7)
RBC: 5.46 Mil/uL (ref 4.22–5.81)

## 2011-04-09 NOTE — Progress Notes (Signed)
Subjective:    Patient ID: [name], male    DOB: September 19, 1956, 55 y.o.   MRN: [id number]  HPI Here to f/u;  Overall some improved with rocephin x1 yest, and levaquin tx with less ill feeling, improved gerneal weakness and stamina, less fever, less dysuria, no further chills,  And cbg's in the 100's.  Pt denies chest pain, increased sob or doe, wheezing, orthopnea, PND, increased LE swelling, palpitations, dizziness or syncope.  Pt denies new neurological symptoms such as new headache, or facial or extremity weakness or numbness   Pt denies polydipsia, polyuria, or low sugar symptoms such as weakness or confusion improved with po intake.  Pt states overall good compliance with meds, trying to follow lower cholesterol, diabetic diet, wt overall stable but little exercise however.    Past Medical History  Diagnosis Date  . Diabetes mellitus     type II  . Hyperlipidemia   . Hypertension   . Kidney stones   . Obstructive sleep apnea   . DVT, lower extremity     right  . Embolus     pulmonary  . Diverticulosis of colon   . Depression   . Renal insufficiency   . Hx of colonic polyps   . Hypogonadism male   . Obesity   . BACK PAIN 12/24/2009  . COLONIC POLYPS, HX OF 10/05/2007  . DEGENERATIVE JOINT DISEASE, KNEES, BILATERAL 11/17/2009  . DEPRESSION 10/05/2007  . DIABETES MELLITUS, TYPE II 06/30/2007  . DIVERTICULOSIS, COLON 10/05/2007  . HYPERLIPIDEMIA 06/30/2007  . HYPERTENSION 06/30/2007  . HYPOGONADISM, MALE 03/04/2008  . HYPOTENSION, ORTHOSTATIC 01/29/2010  . OBESITY, MORBID 03/04/2008  . RENAL INSUFFICIENCY 10/05/2007  . Long term current use of anticoagulant 04/08/2011   Past Surgical History  Procedure Date  . Knee surgury 2000 & 2003     cartilage damage  . Tonsillectomy and adenoidectomy   . Ankle surgury 2001 bilateral    with bone spurs and torn tendon  . Edg 2008    chronic Duodenitis    reports that he has quit smoking. He does not have any smokeless tobacco history  on file. He reports that he does not drink alcohol or use illicit drugs. family history includes Diabetes in his brother and sister; Hypertension in his mother; and Prostate cancer in his father. No Known Allergies Current Outpatient Prescriptions on File Prior to Visit  Medication Sig Dispense Refill  . albuterol (PROAIR HFA) 108 (90 BASE) MCG/ACT inhaler Inhale 2 puffs into the lungs every 4 (four) hours as needed. For shortness of breath       . aspirin (ECOTRIN LOW STRENGTH) 81 MG EC tablet Take 81 mg by mouth daily.        Jerrell Belfast Lancet Super Thin 30G MISC Use 1 stick as directed three times a day       . DULoxetine (CYMBALTA) 60 MG capsule Take 60 mg by mouth daily.        Marland Kitchen ezetimibe (ZETIA) 10 MG tablet Take 10 mg by mouth daily.        Marland Kitchen glucose blood (ONE TOUCH ULTRA TEST) test strip 1 each by Other route. 1 strip three times a day       . hydrochlorothiazide 25 MG tablet Take 25 mg by mouth daily.        Marland Kitchen levofloxacin (LEVAQUIN) 500 MG tablet Take 1 tablet (500 mg total) by mouth daily.  10 tablet  0  . lisinopril (PRINIVIL,ZESTRIL) 5 MG tablet Take  5 mg by mouth daily.        . traMADol (ULTRAM) 50 MG tablet Take 50 mg by mouth. 1-2 by mouth four times per day as needed for pain       . warfarin (COUMADIN) 5 MG tablet Take by mouth as directed.         Review of Systems All otherwise neg per pt     Objective:   Physical Exam BP 102/68  Pulse 83  Temp(Src) 97.7 F (36.5 C) (Oral)  Ht 6\' 2"  (1.88 m)  Wt 354 lb (160.573 kg)  BMI 45.45 kg/m2  SpO2 91% Physical Exam  VS noted, somewhat brighter, less ill appearing Constitutional: Pt appears well-developed and well-nourished.  HENT: Head: Normocephalic.  Right Ear: External ear normal.  Left Ear: External ear normal.  Eyes: Conjunctivae and EOM are normal. Pupils are equal, round, and reactive to light.  Neck: Normal range of motion. Neck supple.  Cardiovascular: Normal rate and regular rhythm.   Pulmonary/Chest:  Effort normal and breath sounds normal.  Abd:  Soft, NT, non-distended, + BS Neurological: Pt is alert. No cranial nerve deficit.  Skin: Skin is warm. No erythema.  Psychiatric: Pt behavior is normal. Thought content normal.  No flank tender       Assessment & Plan:

## 2011-04-09 NOTE — Telephone Encounter (Signed)
Called patient and he is still having chills and fever, feeling a little better. Dr. Jonny Ruiz informed to come back in today for OV and get another Rocephin injection and check blood work again. Patient agreed to do so and will come today at 11 am 04/09/2011.

## 2011-04-09 NOTE — Patient Instructions (Addendum)
You had the rocephin antibiotic shot repeat today Continue all other medications as before, except you can hold on taking the HCTZ fluid pill, and the zestril blood pressure pill until next Monday Please continue to monitor your Blood Pressure at home as well;  Your goal is to be < 140/90 (and higher than 100 on the top number as well) Please go to LAB in the Basement for the blood and/or urine tests to be done today Please call the number on the [facility] Card (the [employer] System) for results of testing in 2-3 days You will likely continue to improve on how you feel today and tomorrow due to the shots in the office, but if you begin to feel worse at any time such as Sunday or Monday, you should not hesitate to go to the ER

## 2011-04-09 NOTE — Telephone Encounter (Signed)
A user error has taken place: encounter opened in error, closed for administrative reasons.

## 2011-04-09 NOTE — Assessment & Plan Note (Signed)
prob uti, urine cx pending, pt overall some improved with rocephin yesterday, took first dose levaquin this am;  Pt takes po ok;  And adamant he does not want ER or admit;  Will tx with rocephin IM today repeat, repeat the wbc, cont the levaquin and f/u Mon if does not cont to improve

## 2011-04-09 NOTE — Assessment & Plan Note (Signed)
BP borderline low today - ok to hold the HCTZ and zestril for 3 days;  Pt to monitor BP at home,  to f/u any worsening symptoms or concerns

## 2011-04-10 NOTE — Assessment & Plan Note (Signed)
stable overall by hx and exam, most recent lab reviewed with pt, and pt to continue medical treatment as before  Lab Results  Component Value Date   WBC 24.5 Repeated and verified X2.* 04/09/2011   HGB 12.8* 04/09/2011   HCT 39.3 04/09/2011   PLT 210.0 04/09/2011   CHOL 120 04/08/2011   TRIG 117.0 04/08/2011   HDL 42.20 04/08/2011   LDLDIRECT 163.5 03/24/2010   ALT 22 03/16/2011   AST 22 03/16/2011   NA 132* 04/08/2011   K 4.3 04/08/2011   CL 93* 04/08/2011   CREATININE 1.2 04/08/2011   BUN 15 04/08/2011   CO2 26 04/08/2011   TSH 2.43 02/16/2011   PSA 0.72 02/16/2011   INR 2.5 04/08/2011   HGBA1C 9.4* 04/08/2011   MICROALBUR 3.0* 02/16/2011

## 2011-04-11 LAB — URINE CULTURE

## 2011-04-13 ENCOUNTER — Ambulatory Visit (INDEPENDENT_AMBULATORY_CARE_PROVIDER_SITE_OTHER): Payer: BC Managed Care – PPO | Admitting: *Deleted

## 2011-04-13 ENCOUNTER — Encounter: Payer: Self-pay | Admitting: Internal Medicine

## 2011-04-13 ENCOUNTER — Ambulatory Visit (INDEPENDENT_AMBULATORY_CARE_PROVIDER_SITE_OTHER): Payer: BC Managed Care – PPO | Admitting: Internal Medicine

## 2011-04-13 VITALS — BP 114/80 | HR 66 | Temp 97.5°F | Ht 74.0 in | Wt 358.4 lb

## 2011-04-13 DIAGNOSIS — E119 Type 2 diabetes mellitus without complications: Secondary | ICD-10-CM

## 2011-04-13 DIAGNOSIS — I2699 Other pulmonary embolism without acute cor pulmonale: Secondary | ICD-10-CM

## 2011-04-13 DIAGNOSIS — N390 Urinary tract infection, site not specified: Secondary | ICD-10-CM

## 2011-04-13 DIAGNOSIS — Z0000 Encounter for general adult medical examination without abnormal findings: Secondary | ICD-10-CM

## 2011-04-13 MED ORDER — GLIPIZIDE 10 MG PO TABS: 10.0000 mg | ORAL_TABLET | Freq: Two times a day (BID) | ORAL | Status: DC

## 2011-04-13 MED ORDER — SAXAGLIPTIN HCL 5 MG PO TABS: 5.0000 mg | ORAL_TABLET | Freq: Every day | ORAL | Status: DC

## 2011-04-13 MED ORDER — METFORMIN HCL 1000 MG PO TABS: 1000.0000 mg | ORAL_TABLET | Freq: Two times a day (BID) | ORAL | Status: DC

## 2011-04-13 NOTE — Progress Notes (Signed)
Subjective:    Patient ID: [name], male    DOB: Oct 31, 1956, 55 y.o.   MRN: [id number]  HPI Here for wellness and f/u;  Overall doing ok;  Pt denies CP, worsening SOB, DOE, wheezing, orthopnea, PND, worsening LE edema, palpitations, dizziness or syncope.  Pt denies neurological change such as new Headache, facial or extremity weakness.  Pt denies polydipsia, polyuria, or low sugar symptoms. Pt states overall good compliance with treatment and medications, good tolerability, and trying to follow lower cholesterol diet.  Pt denies worsening depressive symptoms, suicidal ideation or panic. No fever, wt loss, night sweats, loss of appetite, or other constitutional symptoms.  Pt states good ability with ADL's, low fall risk, home safety reviewed and adequate, no significant changes in hearing or vision, and occasionally active with exercise. On antibx after recent UTI,  Denies urinary symptoms such as dysuria, frequency, urgency,or hematuria. Past Medical History  Diagnosis Date  . Diabetes mellitus     type II  . Hyperlipidemia   . Hypertension   . Kidney stones   . Obstructive sleep apnea   . DVT, lower extremity     right  . Embolus     pulmonary  . Diverticulosis of colon   . Depression   . Renal insufficiency   . Hx of colonic polyps   . Hypogonadism male   . Obesity   . BACK PAIN 12/24/2009  . COLONIC POLYPS, HX OF 10/05/2007  . DEGENERATIVE JOINT DISEASE, KNEES, BILATERAL 11/17/2009  . DEPRESSION 10/05/2007  . DIABETES MELLITUS, TYPE II 06/30/2007  . DIVERTICULOSIS, COLON 10/05/2007  . HYPERLIPIDEMIA 06/30/2007  . HYPERTENSION 06/30/2007  . HYPOGONADISM, MALE 03/04/2008  . HYPOTENSION, ORTHOSTATIC 01/29/2010  . OBESITY, MORBID 03/04/2008  . RENAL INSUFFICIENCY 10/05/2007  . Long term current use of anticoagulant 04/08/2011   Past Surgical History  Procedure Date  . Knee surgury 2000 & 2003     cartilage damage  . Tonsillectomy and adenoidectomy   . Ankle surgury 2001  bilateral    with bone spurs and torn tendon  . Edg 2008    chronic Duodenitis    reports that he has quit smoking. He does not have any smokeless tobacco history on file. He reports that he does not drink alcohol or use illicit drugs. family history includes Diabetes in his brother and sister; Hypertension in his mother; and Prostate cancer in his father. No Known Allergies Current Outpatient Prescriptions on File Prior to Visit  Medication Sig Dispense Refill  . albuterol (PROAIR HFA) 108 (90 BASE) MCG/ACT inhaler Inhale 2 puffs into the lungs every 4 (four) hours as needed. For shortness of breath       . aspirin (ECOTRIN LOW STRENGTH) 81 MG EC tablet Take 81 mg by mouth daily.        Jerrell Belfast Lancet Super Thin 30G MISC Use 1 stick as directed three times a day       . DULoxetine (CYMBALTA) 60 MG capsule Take 60 mg by mouth daily.        Marland Kitchen ezetimibe (ZETIA) 10 MG tablet Take 10 mg by mouth daily.        Marland Kitchen glucose blood (ONE TOUCH ULTRA TEST) test strip 1 each by Other route. 1 strip three times a day       . hydrochlorothiazide 25 MG tablet Take 25 mg by mouth daily.        Marland Kitchen levofloxacin (LEVAQUIN) 500 MG tablet Take 1 tablet (500 mg total) by  mouth daily.  10 tablet  0  . lisinopril (PRINIVIL,ZESTRIL) 5 MG tablet Take 5 mg by mouth daily.        . traMADol (ULTRAM) 50 MG tablet Take 50 mg by mouth. 1-2 by mouth four times per day as needed for pain       . warfarin (COUMADIN) 5 MG tablet Take by mouth as directed.         Review of Systems Review of Systems  Constitutional: Negative for diaphoresis, activity change, appetite change and unexpected weight change.  HENT: Negative for hearing loss, ear pain, facial swelling, mouth sores and neck stiffness.   Eyes: Negative for pain, redness and visual disturbance.  Respiratory: Negative for shortness of breath and wheezing.   Cardiovascular: Negative for chest pain and palpitations.  Gastrointestinal: Negative for diarrhea, blood in  stool, abdominal distention and rectal pain.  Genitourinary: Negative for hematuria, flank pain and decreased urine volume.  Musculoskeletal: Negative for myalgias and joint swelling.  Skin: Negative for color change and wound.  Neurological: Negative for syncope and numbness.  Hematological: Negative for adenopathy.  Psychiatric/Behavioral: Negative for hallucinations, self-injury, decreased concentration and agitation.      Objective:   Physical Exam BP 114/80  Pulse 66  Temp(Src) 97.5 F (36.4 C) (Oral)  Ht 6\' 2"  (1.88 m)  Wt 358 lb 6 oz (162.558 kg)  BMI 46.01 kg/m2  SpO2 94% Physical Exam  VS noted, morbid obese Constitutional: Pt is oriented to person, place, and time. Appears well-developed and well-nourished.  HENT:  Head: Normocephalic and atraumatic.  Right Ear: External ear normal.  Left Ear: External ear normal.  Nose: Nose normal.  Mouth/Throat: Oropharynx is clear and moist.  Eyes: Conjunctivae and EOM are normal. Pupils are equal, round, and reactive to light.  Neck: Normal range of motion. Neck supple. No JVD present. No tracheal deviation present.  Cardiovascular: Normal rate, regular rhythm, normal heart sounds and intact distal pulses.   Pulmonary/Chest: Effort normal and breath sounds normal.  Abdominal: Soft. Bowel sounds are normal. There is no tenderness.  Musculoskeletal: Normal range of motion. Exhibits no edema.  Lymphadenopathy:  Has no cervical adenopathy.  Neurological: Pt is alert and oriented to person, place, and time. Pt has normal reflexes. No cranial nerve deficit.  Skin: Skin is warm and dry. No rash noted.  Psychiatric:  Has  normal mood and affect. Behavior is normal.         Assessment & Plan:

## 2011-04-13 NOTE — Assessment & Plan Note (Addendum)
Uncontrolled and gradually worsening since off the actos, will maximize current meds and f/u 6 wks, but seems likely will need insulin;  Cont diet and wt loss efforts Lab Results  Component Value Date   HGBA1C 9.4* 04/08/2011

## 2011-04-13 NOTE — Assessment & Plan Note (Signed)
Improved, ok to follow - Continue all other medications as before

## 2011-04-13 NOTE — Patient Instructions (Addendum)
Stop the kombiglyze XR Start metformin 1000 mg twice per day Start onglyza 5 mg per day Increase the glucotrolxl to 10 mg twice per day Continue all other medications as before Please return in 6 wks with Labs done before the visit by 2-3 days

## 2011-04-13 NOTE — Assessment & Plan Note (Signed)

## 2011-04-18 ENCOUNTER — Encounter: Payer: Self-pay | Admitting: Internal Medicine

## 2011-04-18 NOTE — Assessment & Plan Note (Signed)
Mod to severe with marked elev cbc, but pt has refused to go to ER; will tx with repeat rocephin IM today, to cont levquin, and f/u urine cx

## 2011-04-20 NOTE — Op Note (Signed)
[name], [name]               ACCOUNT NO.:  [id number]   MEDICAL RECORD NO.:  [id number]          PATIENT TYPE:  AMB   LOCATION:  DAY                          FACILITY:  [facility]   PHYSICIAN:  Marlowe Kays, M.D.  DATE OF BIRTH:  Nov 28, 1956   DATE OF PROCEDURE:  07/24/2008  DATE OF DISCHARGE:                               OPERATIVE REPORT   PREOPERATIVE DIAGNOSIS:  Painful Achilles tendinitis with large  posterior os calcis heel spur right heel.   POSTOPERATIVE DIAGNOSIS:  Painful Achilles tendinitis with large  posterior os calcis heel spur right heel.   OPERATION:  Excision of large posterior os calcis heel spur with  reattachment of Achilles tendon using a rotator cuff anchor right heel.   SURGEON:  Marlowe Kays, M.D.   ASSISTANT:  Nurse.   ANESTHESIA:  General.   INDICATIONS FOR PROCEDURE:  He has had successful surgery on the left  heel.  His right heel has exactly the same problem and has not responded  to nonsurgical treatment.   PROCEDURE IN DETAIL:  Under satisfactory general anesthesia, pneumatic  tourniquet with the right leg Esmarched out nonsterilely tourniquet  inflated to 375 mmHg.  The right leg was prepped with DuraPrep from  midcalf to toes and draped in a sterile field.  Time-out performed.  I  made a curved hockey-stick type incision along the inner border of the  os calcis medially extending it to the posterior heel.  With  subperiosteal dissection I dissected posteriorly to the Achilles tendon  and a large spur taking into the tendon.  A large amount of clear  inflammatory fluid came forth on entering the sub Achilles tendon space.  I brought in the C-arm and documented the site of the spurring as well  as its extent.  After isolating the spur I began removing it with a  combination of osteotome and rongeurs and used several passes with the C-  arm to be sure that all remnants of the spur had been removed.  Some of  this I had to dig out at the  underneath surface of the Achilles tendon.  I then reattached the Achilles tendon using a two-pronged Mitek anchor  in the posterior heel with the two arms of the anchor going through the  Achilles tendon by going from underneath up and then down and back  underneath again and tying adjacent to the bone.  I then supplemented  this with multiple interrupted 2-0 Vicryl in the periosteal layer.  This  nicely cinched down the Achilles tendon.  The wound was irrigated with  sterile saline.  Subcutaneous tissue closed with 2-0 Vicryl, skin with  interrupted 4-0 nylon mattress sutures.  An Adaptic dry sterile dressing  and a well-padded short-leg splint cast were applied.  The tourniquet  was released.  He tolerated the procedure well and at the time of this  dictation was on his way to recovery in satisfactory condition with no  known complications.           ______________________________  Marlowe Kays, M.D.    JA/MEDQ  D:  07/24/2008  T:  07/24/2008  Job:  [id number]

## 2011-04-20 NOTE — Assessment & Plan Note (Signed)
[facility]                             PULMONARY OFFICE NOTE   NAME:[name], [name]                      MRN:          [id number]  DATE:09/19/2007                            DOB:          [birth year]    HISTORY OF PRESENT ILLNESS:  Patient is a 55 year old African American  male patient of Dr. Raphael Gibney that has a known history of hypertension,  diabetes mellitus, dyslipidemia, DVT and pulmonary embolism on chronic  Coumadin therapy and sleep apnea that presents today for an acute office  visit.  Patient complains that over the last 3 days he has had urinary  frequency, urgency.  Patient denies any hematuria, back pain, nausea,  vomiting, abdominal pain.  Last urinary tract infection was greater than  3 years ago.  Patient's home blood sugar checks have been 99 to 110.   PAST MEDICAL HISTORY:  Reviewed.   CURRENT MEDICATIONS:  Reviewed.   PHYSICAL EXAMINATION:  Patient is a morbidly obese male in no acute  distress.  He is afebrile with stable vital signs.  LUNGS:  Sounds are clear.  CARDIAC:  Regular rate and rhythm.  ABDOMEN:  Morbidly obese, soft, nontender, no palpable  hepatosplenomegaly.  Negative CVA tenderness.  EXTREMITIES:  Warm without any rash.   DATA:  Urinalysis revealed large blood and trace leukocytes with  positive protein.   IMPRESSION AND PLAN:  Acute cystitis.  Patient is to begin Cipro 500 mg  x10 days.  Patient may use Pyridium up to 3 times a day as needed over  the next 2 days.  Patient to increase his fluid intake.  Patient will  return back with Dr. Jonny [name] in 2 weeks for a urinalysis after antibiotic  therapy and urine culture is pending at time of dictation.      Rubye Oaks, NP  Electronically Signed      Lonzo Cloud. Kriste Basque, MD  Electronically Signed   TP/MedQ  DD: 09/19/2007  DT: 09/20/2007  Job #: [id number]

## 2011-04-23 ENCOUNTER — Ambulatory Visit (INDEPENDENT_AMBULATORY_CARE_PROVIDER_SITE_OTHER): Payer: BC Managed Care – PPO | Admitting: *Deleted

## 2011-04-23 DIAGNOSIS — I2699 Other pulmonary embolism without acute cor pulmonale: Secondary | ICD-10-CM

## 2011-04-23 NOTE — Op Note (Signed)
[name], [name]               ACCOUNT NO.:  [id number]   MEDICAL RECORD NO.:  [id number]          PATIENT TYPE:  OBV   LOCATION:  0480                         FACILITY:  [facility]   PHYSICIAN:  Marlowe Kays, M.D.  DATE OF BIRTH:  01-09-1956   DATE OF PROCEDURE:  11/27/2004  DATE OF DISCHARGE:  11/28/2004                                 OPERATIVE REPORT   PREOPERATIVE DIAGNOSIS:  Recurrent posterior left os calcis osteophyte.   POSTOPERATIVE DIAGNOSIS:  Recurrent posterior left os calcis osteophyte.   OPERATION:  Excision of recurrent posterior os calcis osteophyte.   SURGEON:  Marlowe Kays, M.D.   ASSISTANT:  Nurse.   ANESTHESIA:  General.   INDICATION FOR PROCEDURE:  Original surgery was on January 29, 2000.  This  time, we are going to follow the surgery with radiation and have him on  Indocin as well.  He is having enough pain that he would like it re-excised.   PROCEDURE:  Under satisfactory general anesthesia with pneumatic tourniquet,  the leg was Esmarched out nonsterilely and prepped from mid-calf to toes to  DuraPrep and draped into the a sterile field.  Going through the old medial  hockey stick-type incision down to the Achilles tendon which had demarcated  from the underlying bone.  I then placed a Penfield 4 instrument in the  wound, took a lateral x-ray using the C-arm, and I was able to identify the  location of the osteophyte.  Based on this, I then kept searching, finding  some material in the Achilles tendon but some more lateral and posteriorly.  When I felt I had removed everything, we took a second lateral C-arm x-ray  and found that the osteophyte had been completely removed.  The wound was  then irrigated with sterile saline.  The capsular periosteal complex was  reapproximated with interrupted 0 Vicryl and the skin and subcutaneous  tissue with interrupted 4-0 nylon mattress sutures.  Betadine, Adaptic, dry  sterile dressing, and a well-padded  short leg splint cast was applied.  The  tourniquet was released.  He tolerated the procedure well and was taken to  the recovery room in satisfactory condition with no complications.      JA/MEDQ  D:  11/27/2004  T:  11/29/2004  Job:  [id number]

## 2011-04-23 NOTE — Procedures (Signed)
[place]               ACCOUNT NO.:  [id number]   MEDICAL RECORD NO.:  [id number]          PATIENT TYPE:  OUT   LOCATION:  SLEEP CENTER                 FACILITY:  [facility]   PHYSICIAN:  Marcelyn Bruins, M.D. [facility] DATE OF BIRTH:  02-Jan-1956   DATE OF STUDY:  11/06/2004                              NOCTURNAL POLYSOMNOGRAM   REFERRING PHYSICIAN:  Dr. Marcelyn Bruins.   INDICATION FOR THE STUDY:  Hypersomnia with sleep apnea.  The patient has  documented sleep apnea and returns for pressure optimization.  Epworth score  is 5.   SLEEP ARCHITECTURE:  The patient had a total sleep time of 361 minutes with  decreased slow wave sleep but adequate REM.  Sleep onset latency was normal  and REM latency was somewhat shortened.   IMPRESSION:  1.  Good control of previously-documented severe obstructive sleep apnea      with 15 cm of CPAP.  The patient used a large Respironics Comfort full      face mask for titration.  2.  No clinically significant cardiac arrhythmias.  3.  Moderate numbers of periodic leg movements with at least mild sleep      disruption.  Clinical correlation is suggested if the patient does not      respond appropriately to CPAP therapy.      KC/MEDQ  D:  11/20/2004 11:13:19  T:  11/21/2004 09:10:09  Job:  [id number]

## 2011-04-23 NOTE — Procedures (Signed)
NAME:  [name], [name]             ACCOUNT NO.:  [id number]   MEDICAL RECORD NO.:  [id number]          PATIENT TYPE:  OUT   LOCATION:  SLEEP CENTER                 FACILITY:  [facility]   PHYSICIAN:  Marcelyn Bruins, M.D. [facility] DATE OF BIRTH:  Mar 24, 1956   DATE OF ADMISSION:  07/10/2004  DATE OF DISCHARGE:  07/10/2004                              NOCTURNAL POLYSOMNOGRAM   REFERRING PHYSICIAN:  Dr. Oliver Barre   INDICATION FOR THE STUDY:  Hypersomnia with sleep apnea.   SLEEP ARCHITECTURE:  The patient had a total sleep time of 331 minutes with  a paucity of REM and slow wave sleep.  Sleep onset latency was very short at  7.5 minutes and REM latency was mildly prolonged at 200 minutes.   IMPRESSION:  1. Severe obstructive sleep apnea/hypopnea syndrome with oxygen desaturation     to 62%.  The patient's respiratory disturbance index was 80 events/hr and     were quite prolonged and resulted in very fragmented sleep.  The events     were not positional.  2. Loud snoring noted throughout the study.  3. No clinically significant cardiac arrhythmias noted.                                   ______________________________                                Marcelyn Bruins, M.D. [facility]     KC/MEDQ  D:  07/28/2004 10:07:29  T:  07/28/2004 13:16:00  Job:  [id number]

## 2011-04-23 NOTE — Consult Note (Signed)
[name], [name]               ACCOUNT NO.:  [id number]   MEDICAL RECORD NO.:  [id number]          PATIENT TYPE:  INP   LOCATION:  4742                         FACILITY:  [facility]   PHYSICIAN:  Vida Roller, M.D.   DATE OF BIRTH:  [birth year]   DATE OF CONSULTATION:  07/27/2005  DATE OF DISCHARGE:                                   CONSULTATION   PRIMARY CARE PHYSICIAN:  Corwin Levins, M.D. [facility].   REFERRING PHYSICIAN:  Rene Paci, M.D. [facility].   HISTORY OF PRESENT ILLNESS:  [name] is a 55 year old African-American man  with a history of hypertension, hyperlipidemia, and diabetes mellitus who  presents after an episode of syncope.  He states that he was at work and had  an episode of coughing followed some mild shortness of breath.  He was  walking to the bathroom when he lost consciousness and fell and struck his  face against an unknown object.  Had a relatively significant laceration  under his lower lip.  He was evaluated by the nurse at where he works.  He  was sent home and his girlfriend took him to the emergency room here at  [facility].  He was admitted by the internal medicine service.  Serial enzymes were obtained, and they were abnormal.  He had been having  some unusual sensations in his chest.  We were asked to evaluate him for the  etiology of his syncope as well as the abnormal cardiac enzymes.  He  describes no postictal episodes to his symptoms although he said he was  quite dizzy afterwards.  He tried to get up and had to sit back down.  The  symptoms pretty much persisted for several hours afterwards.  He was,  however, to talk.  He was able to move all of his extremities.  He had no  nausea, vomiting, or diarrhea.  However, he did have some significant  diaphoresis.  He did not lose bowel or bladder incontinence.  There was no  tonic clonic movements with this by anyone.  He has not had any recurrence  of his syncope.  However, he has had some  unusual off again, on again  sensations in his chest.  He does not describe as pain, but more of a  fullness sensation.   His past medical history is significant for hypertension, diabetes, and  hyperlipidemia all managed by Dr. Jonny Ruiz as an outpatient.  He tells me he has  had pretty good control of these.  He denies any problems with his heart.  He is on the following medications as an outpatient.  1.  Zetia 10 mg once daily.  2.  Crestor 20 mg once daily.  3.  Hydrochlorothiazide 12.5 mg once daily.  4.  Accupril 10 mg once daily.  5.  Glyburide 5 mg once daily.  6.  Actos 45 mg once daily.  7.  Verapamil 240 mg once daily.   He denies any previous surgical history.  He is not allergic to any  medications.  He smokes on a rare occasion, usually when he  drinks.  Probably has about a two-pack year smoking history.  Does not habitually  smoke.  However, he does inhale when he does smoke.  He drinks alcohol on an  infrequent basis, but when he does it is usually with friends.  He does not  use any illicit drugs.  He is employed filling gas cylinders.  Lives in  [place].  He is divorced.  He has two adult children who are both  healthy.  He has a significant family history of diabetes and hypertension.  His sister died of complications of diabetes.  He does not have any history  of congestive heart failure.  He does have a history of prostate cancer.  His father died at age 61.   REVIEW OF SYSTEMS:  He denies any headaches or visual changes.  No sinus  tenderness.  No sinus discharge.  No changes in his voice.  No difficult  swallowing.  No changes in his hearing.  He denies any neck pain. No  problems with his thyroid.  He denies any shortness of breath or wheezing or  asthma.  He has had no hematemesis or hemoptysis.  He denies any overt chest  discomfort. No palpitations.  No PND or orthopnea, lower extremity edema.  He denies any bright red blood per rectum.  He denies any  melena.  No  abdominal pain.  No problems with his liver.  No jaundice or colitis.  No  ulcerative disease.  No changes in his bowel habits.  No abdominal pain.  No  musculoskeletal conditions.  He denies any neurologic conditions.  He has  reasonable vision without difficulty.  He has no skin lesions.  The  remainder of his review of systems is unremarkable.   PHYSICAL EXAMINATION:  GENERAL:  He is a well-developed, well-nourished  obese black male in no apparent distress.  He is alert and oriented x4.  VITAL SIGNS:  His pulse is 76 and regular.  His respirations are 20.  Blood  pressure 114/73.  He is saturating 96% on room air.  HEENT:  Unremarkable.  NECK:  Supple.  There is no jugular venous distention or carotid bruits.  His thyroid is normal size in the midline.  CHEST:  Clear to auscultation bilaterally with normal respiratory movement.  CARDIOVASCULAR:  Slightly distant, but regular.  There is no obvious murmur.  First and second heart sounds are normal.  There is no lifts or thrills.  His point of maximum pulse was not palpable.  ABDOMEN:  Obese, soft, and nontender with normal active bowel sounds.  EXTREMITIES:  Without clubbing, cyanosis, or edema.  His pulses are all 2+  without bruits.  NEUROLOGIC:  Grossly nonfocal.  MUSCULOSKELETAL:  Unremarkable.   His electrocardiogram is abnormal, and he has sinus rhythm, left atrial  enlargement and right bundle branch block with inverted T waves in the  inferior leads.  I do have an old EKG for comparison.  It is from November  2005.  Right bundle branch block  is new.   LABORATORY DATA:  White blood cell count 10.6, H&H 12.6 and 39.6 with  platelet count 206.  His chemistries are all within normal limits.  His GI  panel shows mildly elevated transaminases at 40 and 46 with a normal alk  phos and bilirubin.  His albumin is 3.6.  He has four sets of cardiac  enzymes; two of point-of-care which show troponins of 0.05, 0.08, 0.30,  and 0.17 chronologically.  His CK-MBs are all  normal.  His CKs are only slightly  elevated at 198 and 187.  Myoglobins were normal.  Hepatitis B and C  antibodies are negative.  His chest x-ray shows low lung volumes with some  peribronchial thickening, but no overt congestive heart failure.  His head  CT is still pending.  Urinalysis shows a few bacteria and few squamous  cells, 0-1 white blood cell, 0-1 red blood cells.  No obvious protein.  He  weighs in excess of 330 pounds.   ASSESSMENT:  We have a gentleman who has had an episode of syncope who now has an  abnormal electrocardiogram with abnormal cardiac enzymes who has a history  of hypertension, diabetes mellitus, and hyperlipidemia.  He also had mildly  abnormal LFTs in a pattern consistent with fatty liver.  He is pending an  echocardiogram.  I think it is probably reasonably to obtain cardiac  Dopplers, and I think this gentleman probably needs a heart catheterization  and I have discussed this with him.  We will get him scheduled for that  tomorrow.  Further recommends will be based on diagnostic workup.      Vida Roller, M.D.  Electronically Signed     JH/MEDQ  D:  07/27/2005  T:  07/27/2005  Job:  [id number]

## 2011-04-23 NOTE — Assessment & Plan Note (Signed)
[facility]                              CARDIOLOGY OFFICE NOTE   [name]                      MRN:          [id number]  DATE:07/18/2006                            DOB:          02/08/1956    PRIMARY CARE PHYSICIAN:  [name], M.D.   REASON FOR VISIT:  Follow up cardiac status.   HISTORY OF PRESENT ILLNESS:  I saw [name] back in November.  His history  is outlined in my previous note.  He continues on Coumadin following a  diagnosis of pulmonary embolus back in August of last year.  We have also  been following him due to a mildly abnormal Myoview study suggesting mild  ischemia at the base of the inferolateral wall, although with normal  ejection fraction and in the absence of any significant angina or limiting  dyspnea on exertion.  Electrocardiogram today shows sinus rhythm at 80 beats  per minute with left atrial enlargement but no significant changes compared  to his prior tracing in October.  He continues to be asymptomatic and his  hemodynamically stable.   ALLERGIES:  NO KNOWN DRUG ALLERGIES.   CURRENT MEDICATIONS:  1. Zetia 10 mg p.o. daily.  2. Actos 45 mg p.o. daily.  3. Lantus 60 units subcutaneously daily.  4. Glyburide 5 mg p.o. daily.  5. Aspirin 81 mg p.o. daily.  6. Crestor 20 mg p.o. daily.  7. Spironolactone HCTZ 25/25 mg p.o. daily.  8. Coumadin as directed by the Coumadin clinic.  9. Lisinopril 40 mg p.o. daily.   REVIEW OF SYSTEMS:  As described in the history of present illness.  He is  not having any bleeding problems.   PHYSICAL EXAMINATION:  VITAL SIGNS:  Weight 363 pounds.  Blood pressure  104/74, heart rate 79.  GENERAL:  The patient is in no acute distress.  NECK:  No elevated jugular venous pressure or loud bruits.  LUNGS:  Diminished breath sounds but no labored breathing or wheezing.  CARDIAC:  Distant heart sounds that are regular, without obvious loud murmur  or S3 gallop.  EXTREMITIES:  No significant pitting edema.   IMPRESSION AND RECOMMENDATIONS:  1. Follow up mildly abnormal myocardial perfusion study, as outlined.  The      patient remains asymptomatic and is on a reasonable regimen from the      perspective of risk factor modification.  His blood pressure is well-      controlled as is his heart rate.  At this point, I would not anticipate      any additional invasive cardiac testing unless symptoms intervene.  I      will plan to see him back over the next six months.  2. History of pulmonary embolus diagnosed back in August of last year.  I      would presume that he may be able to discontinued his Coumadin, as he      has been on this regimen for the last year.  I am not aware that he was      diagnosed with any hypercoagulable  state, but I am not certain about      this.  I will defer to Dr. Jonny [name] in this regard.                                [name] Sidle, MD    SGM/MedQ  DD:  07/18/2006  DT:  07/19/2006  Job #:  [id number]   cc:   [name] Levins, MD

## 2011-04-23 NOTE — H&P (Signed)
[name], [name]               ACCOUNT NO.:  [id number]   MEDICAL RECORD NO.:  [id number]          PATIENT TYPE:  EMS   LOCATION:  MAJO                         FACILITY:  [facility]   PHYSICIAN:  Sean A. Everardo All, M.D. [facility] OF BIRTH:  03-23-1956   DATE OF ADMISSION:  07/26/2005  DATE OF DISCHARGE:                                HISTORY & PHYSICAL   REASON FOR ADMISSION:  Syncope.   HISTORY OF PRESENT ILLNESS:  A 55 year old man who was walking at his place  of employment this afternoon just after eating lunch.  He had sudden onset  of anxiety and chest tightness and then lost consciousness.  He fell to the  floor, per witnesses, and sustained a minor cut on the oral aspect of his  lower lip.  He reports that observers told him he was unconscious for 1 to 3  minutes, and then he recovered and is now asymptomatic.  He had some  associated diaphoresis and slight shortness of breath.  A nurse called 911,  and he was brought to the emergency department.   PAST MEDICAL HISTORY:  1.  Type 2 diabetes.  2.  Dyslipidemia.  3.  Hypertension.  4.  No history of heart disease.   MEDICATIONS:  1.  Lantus 40 units a day.  2.  Zetia 10 mg a day.  3.  Crestor 20 mg a day.  4.  Maxzide 25/37.5, 1 daily.  5.  Accupril at uncertain dosage.  6.  Glyburide at uncertain dosage.  7.  Actos 45 mg a day.  8.  Verapamil 240 mg a day.   SOCIAL HISTORY:  He works in [employer].  He is here with his  girlfriend.   FAMILY HISTORY:  Negative for unexplained syncope.   REVIEW OF SYSTEMS:  Denies the following, palpitations, skin rash, rectal  bleeding, hematuria, dysuria, seizure, nausea, vomiting, headache, fever,  weight loss, sore throat, and ear pain.   PHYSICAL EXAMINATION:  VITAL SIGNS:  Blood pressure 94/75, heart rate 86,  temperature 97, respiratory rate 18.  GENERAL:  No distress.  He is lying supine in bed.  SKIN:  Not diaphoretic.  I do not see a rash.  HEENT:  No  evidence of head trauma to external examination. Pharynx: Mucous  membranes are slightly dry.  NECK:  Supple.  CHEST: Clear to auscultation.  CARDIOVASCULAR: No JVD, no edema.  Regular rate and rhythm, no murmur.  Pedal pulses are intact.  ABDOMEN:  Soft, obese, nontender.  No hepatosplenomegaly, no mass.  GENITAL/RECTAL: Examinations not done at this time due to patient's  condition.  EXTREMITIES:  No deformities seen.  NEUROLOGIC:  Alert, oriented.  Does not look anxious nor depressed.  Cranial  nerves appear to be intact, and he readily moves all fours.   LABORATORY DATA:  Electrocardiogram shows right bundle branch block.   Sodium 132, glucose 311.  Hemoglobin 16, WBC 10,600.  AST 40, ALT 46.  Remainder of LFTs are normal.  Cardiac enzymes are normal.  Urinalysis  normal except for glucose.   IMPRESSION:  1.  Syncope of uncertain  etiology.  2.  Type 2 diabetes with apparently poor chronic control.  It is possible      that severe hypoglycemia with resulting hypovolemia caused or      contributed to #1.  3.  Hypertension.  Medication dosages are uncertain.  4.  Slightly elevated liver function tests are noted.  5.  Other chronic medical problems as noted above.   PLAN:  1.  Admit to telemetry.  2.  Check CPKs.  3.  Echocardiogram.  4.  CT scan of the brain.  5.  Intravenous fluids.  6.  Discontinue glyburide as it would appear to be redundant with his      Lantus.  7.  I discussed code status with patient and his girlfriend, and they      request full code.  8.  Since I do not know the dosages of all his medications including his      antihypertensives, I am prescribing empiric dosages.  9.  Hold Maxzide to help with his hydration.  10. Check hepatitis B and C.           ______________________________  Cleophas Dunker. Everardo All, M.D. [facility]     SAE/MEDQ  D:  07/26/2005  T:  07/26/2005  Job:  [id number]   cc:   Corwin Levins, M.D. [facility]  [address]  [place]  [place] [address]

## 2011-04-23 NOTE — Discharge Summary (Signed)
[name], [name]               ACCOUNT NO.:  [id number]   MEDICAL RECORD NO.:  [id number]          PATIENT TYPE:  INP   LOCATION:  4742                         FACILITY:  [facility]   PHYSICIAN:  Rene Paci, M.D. LHCDATE OF BIRTH:  May 08, 1956   DATE OF ADMISSION:  07/26/2005  DATE OF DISCHARGE:  08/05/2005                                 DISCHARGE SUMMARY   DISCHARGE DIAGNOSES:  1.  Right lower extremity deep venous thrombosis.  2.  Bilateral pulmonary emboli.  3.  Hypertension.  4.  Diabetes.  5.  Status post syncopal event secondary to pulmonary embolus.   PAST MEDICAL HISTORY:  1.  Type 2 diabetes.  2.  Dyslipidemia.  3.  Hypertension.  4.  No history of heart disease.   HOSPITAL COURSE:  #1 - SYNCOPE SECONDARY TO PULMONARY EMBOLUS:  Patient was  admitted with syncope and serial cardiac enzymes were checked and patient  was noted to have mildly elevated troponins with a highest troponin level of  0.3.  As a result, a cardiology consult was obtained.  Cardiology initially  recommended cardiac catheterization, but first ordered a preliminary 2-D  echocardiogram which noted an ejection fraction of 50-60% and aortic root  dilatation and right ventricular dysfunction which was suggestive of  possible underlying PE.  As a result, a CT angio was performed which showed  an extensive bilateral PE.  As a result, the patient's cardiac  catheterization was not performed and patient was placed on full dose IV  heparin and Coumadin per pharmacy protocol.  Patient was maintained  initially on O2 nasal cannula for support of mild hypoxia.  Current O2  saturations at time of discharge 96% on 2 L.  At time of discharge the  patient's INR is 2.9.  Plan to discharge him to home on Coumadin 10 mg.  This will need to be closely monitored on as an outpatient.  We have  requested that home health check patient's INR in the home on Saturday,  September 2 and phone results to [facility] coverage  on-call for Dr. Jonny [name] on  Saturday.  In addition, we have set up a follow-up appointment with Dr. Jonny [name]  on Tuesday morning, September 5 at 9:45 a.m.  Of note, patient is a  TE[employer] teacher [employer] and will not receive blood products.  Patient was  instructed to go directly to the emergency room should he ever experience  any bright red blood per rectum or hematuria.   #2 - DIABETES TYPE 2:  Patient was initially on Lantus 40 units at home.  During hospitalization Lantus was increased to 60 units.  Patient was noted  to have mild hypoglycemia with 60 unit dose.  As a result, we have  instructed patient to decrease his insulin to 55 units of Lantus and  maintain a log of his blood sugars at home and bring this log to his primary  care at next visit on Tuesday.   DISCHARGE MEDICATIONS:  1.  Zetia 10 mg p.o. daily.  2.  Lantus 55 units daily.  3.  Crestor 20 mg daily.  4.  Maxzide 25/37.5 once daily.  5.  Accupril.  Patient is to resume pre hospital dosing.  6.  Glyburide 5 mg p.o. daily.  7.  Actos 40 mg p.o. daily.  8.  Verapamil 240 mg p.o. daily.  9.  Coumadin 10 mg p.o. daily.  Prescription provided for 15 tablets.  This      will need to be monitored closely as an outpatient.  INR scheduled for      Saturday, September 2 to be drawn at home.   FOLLOW-UP:  Patient has follow-up with Dr. Oliver [name] on Tuesday, September  5 at 9:45 a.m.  In addition, [facility] will draw INR in the home on  Saturday, September 2.  In addition, a hypercoagulability panel is still  pending at time of discharge.  This will need follow-up as an outpatient.      Melissa S. Peggyann Juba, NP      Rene Paci, M.D. [facility]  Electronically Signed    MSO/MEDQ  D:  08/05/2005  T:  08/05/2005  Job:  [id number]   cc:   [name], M.D. [facility]  [address]  [place]  [place] [address]

## 2011-04-23 NOTE — Assessment & Plan Note (Signed)
[facility]                             PULMONARY OFFICE NOTE   NAME:[name], [name]                      MRN:          [id number]  DATE:04/12/2007                            DOB:          [birth year]    REFERRING PHYSICIAN:  Corwin Levins, MD   HISTORY OF PRESENT ILLNESS:  The patient is a 55 year old gentleman who  I have been asked to see for management of obstructive sleep apnea. The  patient underwent nocturnal polysomnography in 2005 and was found to  have severe obstructive sleep apnea with a respiratory disturbance index  of 80 events per hour and O2 desaturation as low as 62%. I saw the  patient in consultation initially, with the last followup being in  November 2005. The patient subsequently was lost to followup and did not  call for an appointment. The patient states that currently he wears CPAP  intermittently with it being 3/7 nights at the most. He definitely sees  that he sleeps better and is more alert during the day whenever he wears  it. He does have a heated humidifier which he does use and his mask is  approximately 57 year old at this time. The patient typically goes to bed  between 9 and 11 p.m. and gets up at 4:45 to start his day. He feels  rested most mornings. The patient works in a Insurance claims handler and states  that he does note inappropriate daytime sleepiness with periods of  inactivity. He will get sleepy watching TV or reading. Overall the  patient's weight is 5-10 pounds up and down since his last sleep study  and he feels it is probably fairly neutral.   PAST MEDICAL HISTORY:  1. Hypertension.  2. Diabetes.  3. Dyslipidemia.  4. History of DVT and pulmonary emboli 2 years ago.  5. History of sleep apnea as stated above.   CURRENT MEDICATIONS:  1. Actos 45 mg daily.  2. Glyburide 5 mg daily.  3. Coumadin 10 mg daily.  4. Lisinopril 40 mg daily.  5. Zetia 10 mg daily.  6. Crestor 20 mg daily.  7. Aspirin 81 mg  daily.  8. Spironolactone/hydrochlorothiazide daily.  9. Lantus q.a.m.   The patient has no known drug allergies.   SOCIAL HISTORY:  He is divorced and has children. He has a history of  smoking 4-5 years in the past and current smokes 1 or 2 cigarettes a  week.   FAMILY HISTORY:  Remarkable for a sister having heart disease, father  having prostate cancer.   REVIEW OF SYSTEMS:  As per history of present illness. Also see patient  intake form documented in the chart.   PHYSICAL EXAMINATION:  GENERAL:  He is a morbidly obese male in no acute  distress.  VITAL SIGNS:  Blood pressure is 118/88, pulse 92, temperature is 98.9,  weight is greater than 350 pounds. O2 saturation on room air is 95%.  HEENT:  Pupils equal round and reactive to light and accommodation.  Extraocular muscles are intact. Nares patent without discharge.  Oropharynx shows significant elongation of the  soft palate with a normal  uvula.  NECK:  Supple without obvious thyromegaly or lymphadenopathy. It is too  large to assess for JVD.  CHEST:  Totally clear.  CARDIAC:  Exam reveals a regular rate and rhythm, no murmurs, rubs or  gallops.  ABDOMEN:  Soft, nontender with good bowel sounds.  GENITAL/RECTAL/BREASTS:  Not done and not indicated.  EXTREMITIES:  The lower extremities are without edema. Pulses are intact  distally.  NEUROLOGIC:  Alert and oriented with no obvious observable motor  defects.   IMPRESSION:  History of severe obstructive sleep apnea with poor  compliance on CPAP. The patient clearly sees a difference whenever he  wears it on a continual basis. I have reiterated to him the importance  of CPAP with respect to quality of life issues as well as his  cardiovascular issues. I really think that we need to get him wearing  this on a more consistent basis and he states that he will make a better  effort.   PLAN:  1. The patient needs a new CPAP mask.  2. Auto-titrate x2 weeks with download to  optimize pressure since it      has been quite a while since we have looked at this.  3. Work on weight loss.  4. The patient will followup in 6 months or sooner if there are      problems.     Barbaraann Share, MD,FCCP  Electronically Signed    [facility]/MedQ  DD: 04/25/2007  DT: 04/25/2007  Job #: [id number]   cc:   Corwin Levins, MD

## 2011-04-23 NOTE — Assessment & Plan Note (Signed)
[facility]                         GASTROENTEROLOGY OFFICE NOTE   NAME:[name], [name]                      MRN:          [id number]  DATE:02/06/2007                            DOB:          Oct 09, 1956    CHIEF COMPLAINT:  Nausea, weight loss.   ASSESSMENT:  [name] that has had problems  since December. At that time, he was nauseous and had muscle aches. It  sounds like he had some vomiting and diarrhea. Things improved, but he  has had persistent nausea that can come at any time and almost causes a  disabling feeling. He has change in bowels with intermittent loose  stools and some sort of film on the water that sounds like mucus at  times. He does not describe bleeding. He had a positive H-pylori  serology in early February and was treated with amoxicillin and Biaxin  and b.i.d. Aciphex for ten days. It has not seemed to help his symptoms  significantly.   RECOMMENDATIONS AND PLAN:  1. Upper GI endoscopy to investigate the nausea and weight loss and      dyspepsia problems. Can biopsy for H-pylori at that time.  2. If that is unrevealing, consider a repeat colonoscopy (colonoscopy      three years ago normal for screening).  3. Consider irritable bowel syndrome type therapy as he may have a      post-infectious irritable bowel syndrome problem.  4. Obviously weight loss could be explained by his diabetes. His      hemoglobin A1c was 12%, keep that in mind.   HISTORY:  A 55 year old [place] male with problems as outlined  above. In December he was ill with problems as noted above. He thinks he  has lost about 25 pounds since then. He will have this urge in  defecation with mucoid film on the water or the toilet bowl  intermittently. He has this disabling spells of nausea that last for a  while without much in the way of pain, though he has had some atypical  chest pressure. He thought he had the flu in  December. He had fever,  sweats, nausea, some vomiting and diarrhea and aches. That improved, but  did not resolve as outlined above.   GI REVIEW OF SYSTEMS:  Is otherwise negative.   MEDICATIONS:  1. Actos 45 mg daily.  2. Glyburide 5 mg daily.  3. Coumadin 10 mg daily.  4. Lisinopril 40 mg daily.  5. Zetia 10 mg daily.  6. Crestor 20 mg daily.  7. Aspirin 81 mg daily.  8. Spironolactone hydrochlorothiazide 25/25 mg daily.  9. Lantus 55 units every morning.   DRUG ALLERGIES:  None known.   PAST MEDICAL HISTORY:  1. Obesity.  2. Diabetes mellitus, Type 2.  3. Obstructive sleep apnea.  4. Diverticulosis and hyperplastic polyp on screening colonoscopy      September 2005.  5. Deep venous thrombosis and pulmonary embolism August 2006.  6. Syncope secondary to pulmonary embolism.  7. Hypertension.  8. History of abnormal myocardial perfusion study, overall felt to be  low risk by Dr. Diona Browner, seen on January 04, 2007.  9. Foot and knee surgery in the past.   FAMILY HISTORY:  Prostate cancer in his father. Diabetes in siblings. No  colon cancer.   SOCIAL HISTORY:  He is divorced. He is a Chartered certified accountant at [employer]. One son  and one daughter. Lives alone. Social smoking and alcohol.   REVIEW OF SYSTEMS:  As above. He has some pedal edema problems. He had  this syncopal episode back in 2006. Insomnia. Some back pain. A lot of  fatigue. Muscle aches in December. Vision has been poor. Other other  systems negative.   PHYSICAL EXAMINATION:  Height is 6 feet, 2 inches. Weight is 347 pounds.  Blood pressure is 100/62, pulse 68.  EYES: Anicteric.  ENT: Normal mouth, lids and oropharynx.  NECK: Is supple without mass, thyromegaly.  CHEST: Is clear.  HEART: S1, S2. No murmurs, rubs or gallop. .  ABDOMEN: Is obese, soft. Is nontender. No organomegaly or mass.  RECTAL: Examination is deferred at this time.  LYMPHATIC: No neck or supraclavicular nodes palpated.  EXTREMITIES: No  peripheral edema noted in the lower extremities.  SKIN: Warm, dry and no acute rash.  NEURO/PSYCH: He is alert and oriented x3.   Note, the patient takes Coumadin. I do not think it is worthwhile  stopping the Coumadin prior to his upper endoscopy. It may be given his  DVT and pulmonary embolism being over a year ago that he does not need  chronic Coumadin. Will defer to Dr. Jonny Ruiz on that. Note, the patient has  an MCV of 71, but a normal hemoglobin. This longstanding microcytic  index is noted. His creatinine is 1.7 as well. Hemoglobin A1c was 12.3%.  There was a period of time the patient was out of his medications and he  thinks that is the reason for his elevated hemoglobin A1c and that his  blood sugars are under much better control now he tells me.   I appreciate the opportunity to care for this patient.     Iva Boop, MD,FACG  Electronically Signed    CEG/MedQ  DD: 02/06/2007  DT: 02/06/2007  Job #: [id number]   cc:   Corwin Levins, MD

## 2011-04-23 NOTE — Assessment & Plan Note (Signed)
[name]                         GASTROENTEROLOGY OFFICE NOTE   NAME:[name], [name] CRUM                      MRN:          [id number]  DATE:02/21/2007                            DOB:          1955/12/23    CHIEF COMPLAINT:  Follow-up after EGD with reflux esophagitis, problems  with weight loss, nausea and diarrhea.   His upper endoscopy demonstrated duodenitis that showed probable peptic  changes on biopsy, though I questioned Crohn disease at the time.  He  had a  J-shaped stomach, mild esophagitis, and proximal gastritis  changes.  A gastrin level was normal.  I started him on Zegerid 40 mg  daily.  He is feeling better with less nausea and overall is helping  quite a bit.   PAST MEDICAL HISTORY:  Reviewed and unchanged.   Vital signs:  Weight 350 pounds.  Pulse 72, blood pressure 104/62.   ASSESSMENT:  1. Nausea.  2. Duodenitis changes.  3. Some changes of reflux.  4. A J-shaped stomach.   I wonder if he could be having some type of anatomic problem with  regurgitation issues because of the J-shaped stomach, i.e., torsion or  volvulus-type issues.  He does seem to be responding to a PPI.  I am  going to go ahead and check an upper GI series and we will call these  results and there will be further plans pending that.  He does remain on  Coumadin but it is important to remember because of his pulmonary  embolus.  He may need a prescription for Zegerid or other PPI depending  upon his formulary.  A gastric emptying study could be continued as well  because of his diabetes.   He also has renal insufficiency.   NOTE:  I have seen labs once we have the chart showing hemoglobin 11.7  with an MCV of 69, and this is another issue.  He certainly may be iron-  deficient.  His testosterone is low and so his anemia certainly could be  multifactorial as well as the creatinine playing a role here.  In the  past his MCV was 70 a year ago and it looks  like it is always somewhat  low.  If we do not have a ferritin level recently, and it does not look  like it, we will need to get that as well.  We will go ahead and contact  him and do that.    Iva Boop, MD,FACG  Electronically Signed   CEG/MedQ  DD: 02/24/2007  DT: 02/24/2007  Job #: [id number]   cc:   [name] Levins, MD

## 2011-04-23 NOTE — Assessment & Plan Note (Signed)
[facility]                            CARDIOLOGY OFFICE NOTE   KENNIETH, PLOTTS                      MRN:          [id number]  DATE:01/04/2007                            DOB:          18-Mar-1956    REFERRING PHYSICIAN:  Corwin Levins, MD   REASON FOR VISIT:  Routine cardiac followup.   HISTORY OF PRESENT ILLNESS:  I saw Mr. Sjogren back in August.  He has a  history of previous pulmonary embolus managed with Coumadin as well as a  mildly abnormal Myoview in followup suggesting the possibility of mild  ischemia at the base of the inferolateral wall, although with normal  ejection fraction.  We have not pursued this aggressively, and he has  not had any major issues with angina or limiting dyspnea on exertion.  His electrocardiogram is normal in followup showing sinus rhythm at 82  beats per minute and his medical therapy as outlined below.  He is  followed through the [facility], and has had no major bleeding  problems.  He is due to see Dr. Jonny Ruiz over the next month for a complete  physical and follow-up regarding duration of Coumadin therapy.   ALLERGIES:  NO KNOWN DRUG ALLERGIES.   PRESENT MEDICATIONS:  1. Zetia 10 mg p.o. daily.  2. Lantus 60 units subcutaneous daily.  3. Glyburide 5 mg p.o. each morning.  4. Aspirin 81 mg p.o. daily.  5. Crestor 20 mg p.o. daily.  6. Spironolactone/hydrochlorothiazide 25/25 mg p.o. daily.  7. Coumadin as directed by the [facility] Clinic.  8. Lisinopril 40 mg p.o. daily.  9. Diclofenac 75 mg p.o. b.i.d.  10.Promethazine 25 mg p.o. p.r.n.   REVIEW OF SYSTEMS:  As described in history of present illness.   EXAMINATION:  Blood pressure 98/68.  Heart rate 85.  Weight is 352  pounds.  This is a morbidly obese male, in no acute distress.  NECK:  Reveals no elevated jugular venous pressure.  No bruits.  LUNGS:  Clear without labored breathing.  CARDIAC:  Exam reveals a regular rate and rhythm without  loud murmur or  S3 gallop.  EXTREMITIES:  Show no marked pitting edema.   IMPRESSION AND RECOMMENDATIONS:  1. History of mildly abnormal myocardial perfusion study as outlined,      overall low risk, and in the absence of any obvious angina or      limiting dyspnea on exertion.  Mr. Gervasi does have cardiac risk      factors including obesity, hyperlipidemia and diabetes mellitus.      Medical therapy has been the course of action and will be continued      at this point.  We will see him back in the next 6 months, assuming      he remains stable in the interim.  2. Otherwise, continue followup with Dr. Jonny Ruiz for followup of      pulmonary embolus and other health maintenance.     Jonelle Sidle, MD  Electronically Signed    SGM/MedQ  DD: 01/04/2007  DT: 01/04/2007  Job #: [id number]  cc:   Corwin Levins, MD

## 2011-05-04 ENCOUNTER — Ambulatory Visit (INDEPENDENT_AMBULATORY_CARE_PROVIDER_SITE_OTHER): Payer: BC Managed Care – PPO | Admitting: *Deleted

## 2011-05-04 DIAGNOSIS — I2699 Other pulmonary embolism without acute cor pulmonale: Secondary | ICD-10-CM

## 2011-05-04 LAB — POCT INR: INR: 3.5

## 2011-05-18 ENCOUNTER — Ambulatory Visit (INDEPENDENT_AMBULATORY_CARE_PROVIDER_SITE_OTHER): Payer: BC Managed Care – PPO | Admitting: *Deleted

## 2011-05-18 DIAGNOSIS — I2699 Other pulmonary embolism without acute cor pulmonale: Secondary | ICD-10-CM

## 2011-05-25 ENCOUNTER — Other Ambulatory Visit (INDEPENDENT_AMBULATORY_CARE_PROVIDER_SITE_OTHER): Payer: BC Managed Care – PPO

## 2011-05-25 ENCOUNTER — Other Ambulatory Visit: Payer: Self-pay | Admitting: Internal Medicine

## 2011-05-25 DIAGNOSIS — Z1289 Encounter for screening for malignant neoplasm of other sites: Secondary | ICD-10-CM

## 2011-05-25 DIAGNOSIS — IMO0001 Reserved for inherently not codable concepts without codable children: Secondary | ICD-10-CM

## 2011-05-25 DIAGNOSIS — Z0000 Encounter for general adult medical examination without abnormal findings: Secondary | ICD-10-CM

## 2011-05-25 DIAGNOSIS — E119 Type 2 diabetes mellitus without complications: Secondary | ICD-10-CM

## 2011-05-25 LAB — CBC WITH DIFFERENTIAL/PLATELET
Basophils Absolute: 0 10*3/uL (ref 0.0–0.1)
Eosinophils Absolute: 0.2 10*3/uL (ref 0.0–0.7)
Lymphs Abs: 2.7 10*3/uL (ref 0.7–4.0)
MCHC: 32 g/dL (ref 30.0–36.0)
MCV: 72 fl — ABNORMAL LOW (ref 78.0–100.0)
Monocytes Absolute: 0.7 10*3/uL (ref 0.1–1.0)
Neutrophils Relative %: 55.2 % (ref 43.0–77.0)
Platelets: 233 10*3/uL (ref 150.0–400.0)
RDW: 15.3 % — ABNORMAL HIGH (ref 11.5–14.6)
WBC: 8 10*3/uL (ref 4.5–10.5)

## 2011-05-25 LAB — BASIC METABOLIC PANEL
BUN: 19 mg/dL (ref 6–23)
Chloride: 104 mEq/L (ref 96–112)
Creatinine, Ser: 1 mg/dL (ref 0.4–1.5)
Glucose, Bld: 128 mg/dL — ABNORMAL HIGH (ref 70–99)

## 2011-05-25 LAB — URINALYSIS
Bilirubin Urine: NEGATIVE
Ketones, ur: NEGATIVE
Leukocytes, UA: NEGATIVE
Nitrite: NEGATIVE
Total Protein, Urine: NEGATIVE

## 2011-05-25 LAB — LIPID PANEL
Cholesterol: 205 mg/dL — ABNORMAL HIGH (ref 0–200)
Total CHOL/HDL Ratio: 5

## 2011-05-25 LAB — TSH: TSH: 3.84 u[IU]/mL (ref 0.35–5.50)

## 2011-05-25 LAB — COMPREHENSIVE METABOLIC PANEL
AST: 21 U/L (ref 0–37)
Albumin: 4 g/dL (ref 3.5–5.2)
Alkaline Phosphatase: 57 U/L (ref 39–117)
Potassium: 5 mEq/L (ref 3.5–5.1)
Sodium: 141 mEq/L (ref 135–145)
Total Bilirubin: 0.6 mg/dL (ref 0.3–1.2)
Total Protein: 6.5 g/dL (ref 6.0–8.3)

## 2011-05-25 LAB — HEPATIC FUNCTION PANEL
ALT: 29 U/L (ref 0–53)
AST: 21 U/L (ref 0–37)
Total Protein: 6.5 g/dL (ref 6.0–8.3)

## 2011-05-25 LAB — LDL CHOLESTEROL, DIRECT: Direct LDL: 121.2 mg/dL

## 2011-05-25 LAB — HEMOGLOBIN A1C: Hgb A1c MFr Bld: 11.1 % — ABNORMAL HIGH (ref 4.6–6.5)

## 2011-05-25 LAB — MICROALBUMIN / CREATININE URINE RATIO: Microalb, Ur: 4 mg/dL — ABNORMAL HIGH (ref 0.0–1.9)

## 2011-05-26 ENCOUNTER — Ambulatory Visit (INDEPENDENT_AMBULATORY_CARE_PROVIDER_SITE_OTHER): Payer: BC Managed Care – PPO | Admitting: Internal Medicine

## 2011-05-26 ENCOUNTER — Encounter: Payer: Self-pay | Admitting: Internal Medicine

## 2011-05-26 VITALS — BP 108/64 | HR 84 | Temp 97.7°F | Ht 74.0 in | Wt 357.0 lb

## 2011-05-26 DIAGNOSIS — I10 Essential (primary) hypertension: Secondary | ICD-10-CM

## 2011-05-26 DIAGNOSIS — E119 Type 2 diabetes mellitus without complications: Secondary | ICD-10-CM

## 2011-05-26 DIAGNOSIS — E785 Hyperlipidemia, unspecified: Secondary | ICD-10-CM

## 2011-05-26 MED ORDER — INSULIN GLARGINE 100 UNIT/ML ~~LOC~~ SOLN: 15.0000 [IU] | Freq: Every day | SUBCUTANEOUS | Status: DC

## 2011-05-26 NOTE — Patient Instructions (Signed)
OK to stop the [employer] the lantus 15 unts per day (either in the AM or the PM, but at about the same time each day) While starting the lantus like this, check your sugars twice per day (before bfast and before dinner);  If the average after 3 days is still more than 175, please increase the lantus by 3 units;  You can repeat this process every 3 days until the sugars are overall less than 175 Continue all other medications as before Please return in 3 mo with Lab testing done 3-5 days before

## 2011-05-26 NOTE — Assessment & Plan Note (Signed)
stable overall by hx and exam, most recent data reviewed with pt, and pt to continue medical treatment as before  BP Readings from Last 3 Encounters:  05/26/11 108/64  04/13/11 114/80  04/09/11 102/68

## 2011-05-26 NOTE — Assessment & Plan Note (Addendum)
stable overall by hx and exam, most recent data reviewed with pt, and pt to continue medical treatment as before  Lab Results  Component Value Date   LDLCALC 54 04/08/2011

## 2011-05-26 NOTE — Progress Notes (Signed)
Subjective:    Patient ID: [name], male    DOB: 1956-10-07, 55 y.o.   MRN: [id number]  HPI  Here to f/u; overall doing ok,  Pt denies chest pain, increased sob or doe, wheezing, orthopnea, PND, increased LE swelling, palpitations, dizziness or syncope.  Pt denies new neurological symptoms such as new headache, or facial or extremity weakness or numbness   Pt denies polydipsia, polyuria, or low sugar symptoms such as weakness or confusion improved with po intake.  Pt states overall good compliance with meds, trying to follow lower cholesterol, diabetic diet, wt overall stable but little exercise however.  Unfortunately it appears his attempt to stay on orals is not working well, as cbg's are in the 200's or more. Past Medical History  Diagnosis Date  . Diabetes mellitus     type II  . Hyperlipidemia   . Hypertension   . Kidney stones   . Obstructive sleep apnea   . DVT, lower extremity     right  . Embolus     pulmonary  . Diverticulosis of colon   . Depression   . Renal insufficiency   . Hx of colonic polyps   . Hypogonadism male   . Obesity   . BACK PAIN 12/24/2009  . COLONIC POLYPS, HX OF 10/05/2007  . DEGENERATIVE JOINT DISEASE, KNEES, BILATERAL 11/17/2009  . DEPRESSION 10/05/2007  . DIABETES MELLITUS, TYPE II 06/30/2007  . DIVERTICULOSIS, COLON 10/05/2007  . HYPERLIPIDEMIA 06/30/2007  . HYPERTENSION 06/30/2007  . HYPOGONADISM, MALE 03/04/2008  . HYPOTENSION, ORTHOSTATIC 01/29/2010  . OBESITY, MORBID 03/04/2008  . RENAL INSUFFICIENCY 10/05/2007  . Long term current use of anticoagulant 04/08/2011   Past Surgical History  Procedure Date  . Knee surgury 2000 & 2003     cartilage damage  . Tonsillectomy and adenoidectomy   . Ankle surgury 2001 bilateral    with bone spurs and torn tendon  . Edg 2008    chronic Duodenitis    reports that he has quit smoking. He does not have any smokeless tobacco history on file. He reports that he does not drink alcohol or use illicit  drugs. family history includes Diabetes in his brother and sister; Hypertension in his mother; and Prostate cancer in his father. No Known Allergies Current Outpatient Prescriptions on File Prior to Visit  Medication Sig Dispense Refill  . albuterol (PROAIR HFA) 108 (90 BASE) MCG/ACT inhaler Inhale 2 puffs into the lungs every 4 (four) hours as needed. For shortness of breath       . aspirin (ECOTRIN LOW STRENGTH) 81 MG EC tablet Take 81 mg by mouth daily.        Jerrell Belfast Lancet Super Thin 30G MISC Use 1 stick as directed three times a day       . DULoxetine (CYMBALTA) 60 MG capsule Take 60 mg by mouth daily.        Marland Kitchen ezetimibe (ZETIA) 10 MG tablet Take 10 mg by mouth daily.        Marland Kitchen glipiZIDE (GLUCOTROL) 10 MG tablet Take 1 tablet (10 mg total) by mouth 2 (two) times daily before a meal.  60 tablet  11  . glucose blood (ONE TOUCH ULTRA TEST) test strip 1 each by Other route. 1 strip three times a day       . hydrochlorothiazide 25 MG tablet Take 25 mg by mouth daily.        Marland Kitchen lisinopril (PRINIVIL,ZESTRIL) 5 MG tablet Take 5 mg by mouth  daily.        . metFORMIN (GLUCOPHAGE) 1000 MG tablet Take 1 tablet (1,000 mg total) by mouth 2 (two) times daily with a meal.  60 tablet  11  . traMADol (ULTRAM) 50 MG tablet Take 50 mg by mouth. 1-2 by mouth four times per day as needed for pain       . warfarin (COUMADIN) 5 MG tablet Take by mouth as directed.        Marland Kitchen DISCONTD: saxagliptin HCl (ONGLYZA) 5 MG TABS tablet Take 1 tablet (5 mg total) by mouth daily.  30 tablet  11   Review of Systems Review of Systems  Constitutional: Negative for diaphoresis and unexpected weight change.  HENT: Negative for drooling and tinnitus.   Eyes: Negative for photophobia and visual disturbance.  Respiratory: Negative for choking and stridor.   Gastrointestinal: Negative for vomiting and blood in stool.  Genitourinary: Negative for hematuria and decreased urine volume.     Objective:   Physical Exam BP 108/64   Pulse 84  Temp(Src) 97.7 F (36.5 C) (Oral)  Ht 6\' 2"  (1.88 m)  Wt 357 lb (161.934 kg)  BMI 45.84 kg/m2  SpO2 96% Physical Exam  VS noted Constitutional: Pt appears well-developed and well-nourished.  HENT: Head: Normocephalic.  Right Ear: External ear normal.  Left Ear: External ear normal.  Eyes: Conjunctivae and EOM are normal. Pupils are equal, round, and reactive to light.  Neck: Normal range of motion. Neck supple.  Cardiovascular: Normal rate and regular rhythm.   Pulmonary/Chest: Effort normal and breath sounds normal.  Neurological: Pt is alert. No cranial nerve deficit.  Skin: Skin is warm. No erythema.  Psychiatric: Pt behavior is normal. Thought content normal.         Assessment & Plan:

## 2011-05-26 NOTE — Assessment & Plan Note (Signed)
Severe uncontrolled on current meds (actos changed to onglyza);  Will d/c the onglyza, cont glipizide and metformin for now, but will need lantus 15 units with home titration up as instructed; f/u 3 mo

## 2011-06-07 ENCOUNTER — Telehealth: Payer: Self-pay | Admitting: *Deleted

## 2011-06-07 ENCOUNTER — Other Ambulatory Visit: Payer: Self-pay | Admitting: *Deleted

## 2011-06-07 MED ORDER — METFORMIN HCL 1000 MG PO TABS: 1000.0000 mg | ORAL_TABLET | Freq: Two times a day (BID) | ORAL | Status: DC

## 2011-06-07 NOTE — Telephone Encounter (Signed)
No need to take the onglyza, as did not seem effective at last OV

## 2011-06-07 NOTE — Telephone Encounter (Signed)
Pharmacy requesting #[phone] supply for Onglyza 5mg  tablet [not on Pt's med list], yet shows to discontinue medication at 05/26/11 OV.?

## 2011-06-08 ENCOUNTER — Encounter: Payer: Self-pay | Admitting: *Deleted

## 2011-06-08 NOTE — Telephone Encounter (Signed)
Rx informed of Denial.

## 2011-06-11 ENCOUNTER — Ambulatory Visit (INDEPENDENT_AMBULATORY_CARE_PROVIDER_SITE_OTHER): Payer: BC Managed Care – PPO | Admitting: *Deleted

## 2011-06-11 DIAGNOSIS — I2699 Other pulmonary embolism without acute cor pulmonale: Secondary | ICD-10-CM

## 2011-06-22 ENCOUNTER — Other Ambulatory Visit: Payer: Self-pay | Admitting: Internal Medicine

## 2011-06-30 ENCOUNTER — Other Ambulatory Visit: Payer: Self-pay

## 2011-06-30 DIAGNOSIS — E119 Type 2 diabetes mellitus without complications: Secondary | ICD-10-CM

## 2011-06-30 MED ORDER — INSULIN GLARGINE 100 UNIT/ML ~~LOC~~ SOLN: 50.0000 [IU] | Freq: Every day | SUBCUTANEOUS | Status: DC

## 2011-07-09 ENCOUNTER — Encounter: Payer: Self-pay | Admitting: *Deleted

## 2011-07-13 ENCOUNTER — Ambulatory Visit (INDEPENDENT_AMBULATORY_CARE_PROVIDER_SITE_OTHER): Payer: BC Managed Care – PPO | Admitting: *Deleted

## 2011-07-13 DIAGNOSIS — I2699 Other pulmonary embolism without acute cor pulmonale: Secondary | ICD-10-CM

## 2011-08-04 ENCOUNTER — Other Ambulatory Visit: Payer: Self-pay

## 2011-08-04 MED ORDER — INSULIN GLARGINE 100 UNIT/ML ~~LOC~~ SOLN: 50.0000 [IU] | Freq: Every day | SUBCUTANEOUS | Status: DC

## 2011-08-05 ENCOUNTER — Other Ambulatory Visit: Payer: Self-pay

## 2011-08-05 MED ORDER — INSULIN GLARGINE 100 UNIT/ML ~~LOC~~ SOLN: 50.0000 [IU] | Freq: Every day | SUBCUTANEOUS | Status: DC

## 2011-08-16 ENCOUNTER — Other Ambulatory Visit: Payer: BC Managed Care – PPO

## 2011-08-17 ENCOUNTER — Other Ambulatory Visit (INDEPENDENT_AMBULATORY_CARE_PROVIDER_SITE_OTHER): Payer: BC Managed Care – PPO

## 2011-08-17 ENCOUNTER — Ambulatory Visit (INDEPENDENT_AMBULATORY_CARE_PROVIDER_SITE_OTHER): Payer: BC Managed Care – PPO | Admitting: *Deleted

## 2011-08-17 ENCOUNTER — Other Ambulatory Visit: Payer: Self-pay | Admitting: Internal Medicine

## 2011-08-17 DIAGNOSIS — I2699 Other pulmonary embolism without acute cor pulmonale: Secondary | ICD-10-CM

## 2011-08-17 DIAGNOSIS — E119 Type 2 diabetes mellitus without complications: Secondary | ICD-10-CM

## 2011-08-17 LAB — BASIC METABOLIC PANEL
Calcium: 9.1 mg/dL (ref 8.4–10.5)
Creatinine, Ser: 1.1 mg/dL (ref 0.4–1.5)
GFR: 90.42 mL/min (ref >60.00)
Sodium: 140 mEq/L (ref 135–145)

## 2011-08-17 LAB — LIPID PANEL
HDL: 41.1 mg/dL (ref >39.00)
Total CHOL/HDL Ratio: 3

## 2011-08-17 LAB — POCT INR: INR: 2.6

## 2011-08-19 ENCOUNTER — Encounter: Payer: Self-pay | Admitting: Internal Medicine

## 2011-08-19 ENCOUNTER — Ambulatory Visit (INDEPENDENT_AMBULATORY_CARE_PROVIDER_SITE_OTHER): Payer: BC Managed Care – PPO | Admitting: Internal Medicine

## 2011-08-19 VITALS — BP 112/80 | HR 78 | Temp 98.1°F | Ht 74.0 in | Wt 364.5 lb

## 2011-08-19 DIAGNOSIS — Z23 Encounter for immunization: Secondary | ICD-10-CM

## 2011-08-19 DIAGNOSIS — Z0000 Encounter for general adult medical examination without abnormal findings: Secondary | ICD-10-CM

## 2011-08-19 DIAGNOSIS — I10 Essential (primary) hypertension: Secondary | ICD-10-CM

## 2011-08-19 DIAGNOSIS — E785 Hyperlipidemia, unspecified: Secondary | ICD-10-CM

## 2011-08-19 DIAGNOSIS — E119 Type 2 diabetes mellitus without complications: Secondary | ICD-10-CM

## 2011-08-19 MED ORDER — INSULIN GLARGINE 100 UNIT/ML ~~LOC~~ SOLN: 55.0000 [IU] | Freq: Every day | SUBCUTANEOUS | Status: DC

## 2011-08-19 NOTE — Assessment & Plan Note (Signed)
Mild uncontrolled, to increase the lantus to 55, though I think he could tolerate 60;, most recent data reviewed with pt, and pt to continue medical treatment as before Lab Results  Component Value Date   HGBA1C 8.5* 08/17/2011

## 2011-08-19 NOTE — Assessment & Plan Note (Signed)
stable overall by hx and exam, most recent data reviewed with pt, and pt to continue medical treatment as before  BP Readings from Last 3 Encounters:  08/19/11 112/80  05/26/11 108/64  04/13/11 114/80

## 2011-08-19 NOTE — Assessment & Plan Note (Signed)
stable overall by hx and exam, most recent data reviewed with pt, and pt to continue medical treatment as before  Lab Results  Component Value Date   LDLCALC 46 08/17/2011    

## 2011-08-19 NOTE — Progress Notes (Signed)
Subjective:    Patient ID: [name], male    DOB: 1956-03-27, 55 y.o.   MRN: [id number]  HPI  Here to f/u; overall doing ok,  Pt denies chest pain, increased sob or doe, wheezing, orthopnea, PND, increased LE swelling, palpitations, dizziness or syncope.  Pt denies new neurological symptoms such as new headache, or facial or extremity weakness or numbness   Pt denies polydipsia, polyuria, or low sugar symptoms such as weakness or confusion improved with po intake.  Pt states overall good compliance with meds, trying to follow lower cholesterol, diabetic diet, wt overall stable but little exercise however.  CBG's in mid 100's, sometimes better first in the am, very rare low sugar if any in the am but fearful of further low sugars Past Medical History  Diagnosis Date  . Diabetes mellitus     type II  . Hyperlipidemia   . Hypertension   . Kidney stones   . Obstructive sleep apnea   . DVT, lower extremity     right  . Embolus     pulmonary  . Diverticulosis of colon   . Depression   . Renal insufficiency   . Hx of colonic polyps   . Hypogonadism male   . Obesity   . BACK PAIN 12/24/2009  . COLONIC POLYPS, HX OF 10/05/2007  . DEGENERATIVE JOINT DISEASE, KNEES, BILATERAL 11/17/2009  . DEPRESSION 10/05/2007  . DIABETES MELLITUS, TYPE II 06/30/2007  . DIVERTICULOSIS, COLON 10/05/2007  . HYPERLIPIDEMIA 06/30/2007  . HYPERTENSION 06/30/2007  . HYPOGONADISM, MALE 03/04/2008  . HYPOTENSION, ORTHOSTATIC 01/29/2010  . OBESITY, MORBID 03/04/2008  . RENAL INSUFFICIENCY 10/05/2007  . Long term current use of anticoagulant 04/08/2011   Past Surgical History  Procedure Date  . Knee surgury 2000 & 2003     cartilage damage  . Tonsillectomy and adenoidectomy   . Ankle surgury 2001 bilateral    with bone spurs and torn tendon  . Edg 2008    chronic Duodenitis    reports that he has quit smoking. He does not have any smokeless tobacco history on file. He reports that he does not drink alcohol  or use illicit drugs. family history includes Diabetes in his brother and sister; Hypertension in his mother; and Prostate cancer in his father. No Known Allergies Current Outpatient Prescriptions on File Prior to Visit  Medication Sig Dispense Refill  . albuterol (PROAIR HFA) 108 (90 BASE) MCG/ACT inhaler Inhale 2 puffs into the lungs every 4 (four) hours as needed. For shortness of breath       . aspirin (ECOTRIN LOW STRENGTH) 81 MG EC tablet Take 81 mg by mouth daily.        Jerrell Belfast Lancet Super Thin 30G MISC Use 1 stick as directed three times a day       . DULoxetine (CYMBALTA) 60 MG capsule Take 60 mg by mouth daily.        Marland Kitchen ezetimibe (ZETIA) 10 MG tablet Take 10 mg by mouth daily.        Marland Kitchen glipiZIDE (GLUCOTROL) 10 MG tablet Take 1 tablet (10 mg total) by mouth 2 (two) times daily before a meal.  60 tablet  11  . glucose blood (ONE TOUCH ULTRA TEST) test strip 1 each by Other route. 1 strip three times a day       . hydrochlorothiazide 25 MG tablet Take 25 mg by mouth daily.        Marland Kitchen lisinopril (PRINIVIL,ZESTRIL) 5 MG tablet Take  5 mg by mouth daily.        . metFORMIN (GLUCOPHAGE) 1000 MG tablet Take 1 tablet (1,000 mg total) by mouth 2 (two) times daily with a meal.  180 tablet  3  . traMADol (ULTRAM) 50 MG tablet Take 50 mg by mouth. 1-2 by mouth four times per day as needed for pain       . warfarin (COUMADIN) 5 MG tablet TAKE AS DIRECTED BY COUMADIN CLINIC.  160 tablet  1   Review of Systems Review of Systems  Constitutional: Negative for diaphoresis and unexpected weight change.  HENT: Negative for drooling and tinnitus.   Eyes: Negative for photophobia and visual disturbance.  Respiratory: Negative for choking and stridor.   Gastrointestinal: Negative for vomiting and blood in stool.  Genitourinary: Negative for hematuria and decreased urine volume.    Objective:   Physical Exam BP 112/80  Pulse 78  Temp(Src) 98.1 F (36.7 C) (Oral)  Ht 6\' 2"  (1.88 m)  Wt 364 lb 8 oz  (165.336 kg)  BMI 46.80 kg/m2  SpO2 95% Physical Exam  VS noted, obese Constitutional: Pt appears well-developed and well-nourished.  HENT: Head: Normocephalic.  Right Ear: External ear normal.  Left Ear: External ear normal.  Eyes: Conjunctivae and EOM are normal. Pupils are equal, round, and reactive to light.  Neck: Normal range of motion. Neck supple.  Cardiovascular: Normal rate and regular rhythm.   Pulmonary/Chest: Effort normal and breath sounds normal.  Abd:  Soft, NT, non-distended, + BS Neurological: Pt is alert. No cranial nerve deficit.  Skin: Skin is warm. No erythema.  Psychiatric: Pt behavior is normal. Thought content normal.         Assessment & Plan:

## 2011-08-19 NOTE — Patient Instructions (Signed)
You had the flu shot today Increase the lantus to 55 units per day Continue all other medications as before Please return in 6 mo with Lab testing done 3-5 days before

## 2011-09-14 ENCOUNTER — Ambulatory Visit (INDEPENDENT_AMBULATORY_CARE_PROVIDER_SITE_OTHER): Payer: BC Managed Care – PPO | Admitting: *Deleted

## 2011-09-14 DIAGNOSIS — I2699 Other pulmonary embolism without acute cor pulmonale: Secondary | ICD-10-CM

## 2011-09-18 ENCOUNTER — Ambulatory Visit (INDEPENDENT_AMBULATORY_CARE_PROVIDER_SITE_OTHER): Payer: BC Managed Care – PPO | Admitting: Internal Medicine

## 2011-09-18 ENCOUNTER — Encounter: Payer: Self-pay | Admitting: Internal Medicine

## 2011-09-18 VITALS — BP 120/82 | Temp 97.0°F | Wt 366.1 lb

## 2011-09-18 DIAGNOSIS — R197 Diarrhea, unspecified: Secondary | ICD-10-CM | POA: Insufficient documentation

## 2011-09-18 DIAGNOSIS — E119 Type 2 diabetes mellitus without complications: Secondary | ICD-10-CM

## 2011-09-18 DIAGNOSIS — M549 Dorsalgia, unspecified: Secondary | ICD-10-CM | POA: Insufficient documentation

## 2011-09-18 MED ORDER — OXYCODONE-ACETAMINOPHEN 5-325 MG PO TABS: 1.0000 | ORAL_TABLET | ORAL | Status: DC | PRN

## 2011-09-18 MED ORDER — PREDNISONE 20 MG PO TABS: ORAL_TABLET | ORAL | Status: DC

## 2011-09-18 NOTE — Progress Notes (Signed)
  Subjective:    Patient ID: [name], male    DOB: [birth year], 55 y.o.   MRN: [id number]  HPI Comes in to Saturday clinic today for acute assessment of new onset back pain  . That is becoming more severe for a week.  Last time  Has had back pain was years ago. No surgeries .  Onset about a week  Ago  insidious onset after getting out of bed with a twinge.   Progressing and now hurts Right  Leg area.  Walk with limps.  Tried pain pills yesterday  Hydrocodone no help.   10/10.  Standing worse than sitting.   No fall hematuria trauma  Weakness fever infection.   "Diarrhea" about 10 days  Ago.  Hx of stomach problems   No exposure   Well water no change  About 3 loose stools per day.  No blood. No nocturnal sx . No vomiting flank pain.  On anticoagulation  And   No change. dosing recently  DM: blood sugars  Around 104 . Controlled per patient and no recent lows.  Review of Systems No fever vision change new numbness incontinence  Rest as per hpi  Past history family history social history reviewed in the electronic medical record.     Objective:   Physical Exam WDWN  In moderated distress when moves  very painful to arise from chair and walk but can do this independently  Oriented x 3 and no noted deficits in memory, attention, and speech. Neck: Supple without adenopathy or masses or bruits Chest resp clear  Quiet respirations Cv rr   Nl perfusion Abd large no obv local tenderness  Or mass limited by pain and  Size . ExT no acute edema or rash noted Back points to right ls area and buttocks  Gait antalagic. Toe heel strength seems ok  Unable to do dtrs..  Skin; No bleeding bruising     Assessment & Plan:  Acute lbp with radiculopathy  Seems mechanical new problem Lots of comorbid conditions make treatment problematic  Such as DM anticoagulation. And obesity.  Disc options on weekend clinic Pain control as needed. Trial of pred with caution and needs fu visit with pcp this  week for poss imaging study or further eval  Loose stools   Unsure if significant  Doesn't sound like infection present/ if se of meds  Fu with PCP if continues.

## 2011-09-18 NOTE — Patient Instructions (Signed)
Stop the tramadol or vicodin  Can take the oxycodone  1-2 at a time  . This is a narcotic med. Prednisone to decrease inflammation  Check your blood sugars more frequently and  May need to stop if over 250 or call for advice . Need OV with your  Reg doctor Dr Jonny Ruiz to decide on x ray and further evaluation with specialist . If worse  Go to ED .

## 2011-09-24 ENCOUNTER — Encounter: Payer: Self-pay | Admitting: Internal Medicine

## 2011-09-24 ENCOUNTER — Ambulatory Visit (INDEPENDENT_AMBULATORY_CARE_PROVIDER_SITE_OTHER): Payer: BC Managed Care – PPO | Admitting: Internal Medicine

## 2011-09-24 ENCOUNTER — Encounter: Payer: Self-pay | Admitting: *Deleted

## 2011-09-24 VITALS — BP 124/82 | HR 89 | Temp 98.4°F | Ht 74.0 in | Wt 372.4 lb

## 2011-09-24 DIAGNOSIS — E119 Type 2 diabetes mellitus without complications: Secondary | ICD-10-CM

## 2011-09-24 DIAGNOSIS — E785 Hyperlipidemia, unspecified: Secondary | ICD-10-CM

## 2011-09-24 DIAGNOSIS — I10 Essential (primary) hypertension: Secondary | ICD-10-CM

## 2011-09-24 DIAGNOSIS — M549 Dorsalgia, unspecified: Secondary | ICD-10-CM

## 2011-09-24 DIAGNOSIS — L03211 Cellulitis of face: Secondary | ICD-10-CM

## 2011-09-24 DIAGNOSIS — L0201 Cutaneous abscess of face: Secondary | ICD-10-CM

## 2011-09-24 MED ORDER — DOXYCYCLINE HYCLATE 100 MG PO TABS: 100.0000 mg | ORAL_TABLET | Freq: Two times a day (BID) | ORAL | Status: AC

## 2011-09-24 MED ORDER — OXYCODONE-ACETAMINOPHEN 5-325 MG PO TABS: 1.0000 | ORAL_TABLET | ORAL | Status: AC | PRN

## 2011-09-24 NOTE — Assessment & Plan Note (Signed)
Exam benign and symptoms improved mildly in the past wk, suspect pain flare related to underlying possible lumbar DJD or DDD,  To finish the predpack, refill the percocet, gave work note for total just over 2 wks, but if still with signficant pain in 1 more wk I would refer [place] [facility]

## 2011-09-24 NOTE — Assessment & Plan Note (Signed)
Much improved with cbg's in the low 100's per pt since started insuliin, up a bit with the recent prednisone, but overall improved; Continue all other medications as before Lab Results  Component Value Date   HGBA1C 8.5* 08/17/2011

## 2011-09-24 NOTE — Patient Instructions (Signed)
Continue all other medications as before, including to finish the prednisone You are given the work note Please call in 1 wk if not improved, as we can refer to [facility]

## 2011-09-25 ENCOUNTER — Encounter: Payer: Self-pay | Admitting: Internal Medicine

## 2011-09-25 NOTE — Progress Notes (Signed)
Subjective:    Patient ID: [name], male    DOB: January 20, 1956, 55 y.o.   MRN: [id number]  HPI  Here to f/u; overall doing ok,  Pt denies chest pain, increased sob or doe, wheezing, orthopnea, PND, increased LE swelling, palpitations, dizziness or syncope.  Pt denies new neurological symptoms such as new headache, or facial or extremity weakness or numbness   Pt denies polydipsia, polyuria, or low sugar symptoms such as weakness or confusion improved with po intake.  Pt states overall good compliance with meds, trying to follow lower cholesterol, diabetic diet, wt overall stable but little exercise however.  Pt continues to have recurring right LBP  With radiation to the knee with 1 wk increased severity, intermittent and mild worse to standing up and sitting down, but ok with standing and sitting after, but no bowel or bladder change, fever, wt loss,  worsening LE pain/numbness/weakness, gait change or falls.  Also with hair bump to right mid cheek/face, now with approx 1 cm area erythema, tedner, induration but no fluctuance or drainage.  Was seen last wk with predpack for back pain, overall mild improved Past Medical History  Diagnosis Date  . Diabetes mellitus     type II  . Hyperlipidemia   . Hypertension   . Kidney stones   . Obstructive sleep apnea   . DVT, lower extremity     right  . Embolus     pulmonary  . Diverticulosis of colon   . Depression   . Renal insufficiency   . Hx of colonic polyps   . Hypogonadism male   . Obesity   . BACK PAIN 12/24/2009  . COLONIC POLYPS, HX OF 10/05/2007  . DEGENERATIVE JOINT DISEASE, KNEES, BILATERAL 11/17/2009  . DEPRESSION 10/05/2007  . DIABETES MELLITUS, TYPE II 06/30/2007  . DIVERTICULOSIS, COLON 10/05/2007  . HYPERLIPIDEMIA 06/30/2007  . HYPERTENSION 06/30/2007  . HYPOGONADISM, MALE 03/04/2008  . HYPOTENSION, ORTHOSTATIC 01/29/2010  . OBESITY, MORBID 03/04/2008  . RENAL INSUFFICIENCY 10/05/2007  . Long term current use of anticoagulant  04/08/2011   Past Surgical History  Procedure Date  . Knee surgury 2000 & 2003     cartilage damage  . Tonsillectomy and adenoidectomy   . Ankle surgury 2001 bilateral    with bone spurs and torn tendon  . Edg 2008    chronic Duodenitis    reports that he has quit smoking. He does not have any smokeless tobacco history on file. He reports that he does not drink alcohol or use illicit drugs. family history includes Diabetes in his brother and sister; Hypertension in his mother; and Prostate cancer in his father. No Known Allergies Current Outpatient Prescriptions on File Prior to Visit  Medication Sig Dispense Refill  . albuterol (PROAIR HFA) 108 (90 BASE) MCG/ACT inhaler Inhale 2 puffs into the lungs every 4 (four) hours as needed. For shortness of breath       . aspirin (ECOTRIN LOW STRENGTH) 81 MG EC tablet Take 81 mg by mouth daily.        Jerrell Belfast Lancet Super Thin 30G MISC Use 1 stick as directed three times a day       . DULoxetine (CYMBALTA) 60 MG capsule Take 60 mg by mouth daily.        Marland Kitchen ezetimibe (ZETIA) 10 MG tablet Take 10 mg by mouth daily.        Marland Kitchen glipiZIDE (GLUCOTROL) 10 MG tablet Take 1 tablet (10 mg total) by mouth 2 (  two) times daily before a meal.  60 tablet  11  . glucose blood (ONE TOUCH ULTRA TEST) test strip 1 each by Other route. 1 strip three times a day       . hydrochlorothiazide 25 MG tablet Take 25 mg by mouth daily.        . insulin glargine (LANTUS SOLOSTAR) 100 UNIT/ML injection Inject 55 Units into the skin at bedtime.  45 mL  3  . lisinopril (PRINIVIL,ZESTRIL) 5 MG tablet Take 5 mg by mouth daily.        . metFORMIN (GLUCOPHAGE) 1000 MG tablet Take 1 tablet (1,000 mg total) by mouth 2 (two) times daily with a meal.  180 tablet  3  . traMADol (ULTRAM) 50 MG tablet Take 50 mg by mouth. 1-2 by mouth four times per day as needed for pain       . warfarin (COUMADIN) 5 MG tablet TAKE AS DIRECTED BY COUMADIN CLINIC.  160 tablet  1   Review of Systems Review  of Systems  Constitutional: Negative for diaphoresis and unexpected weight change.  HENT: Negative for drooling and tinnitus.   Eyes: Negative for photophobia and visual disturbance.  Respiratory: Negative for choking and stridor.   Gastrointestinal: Negative for vomiting and blood in stool.  Genitourinary: Negative for hematuria and decreased urine volume.     Objective:   Physical Exam BP 124/82  Pulse 89  Temp(Src) 98.4 F (36.9 C) (Oral)  Ht 6\' 2"  (1.88 m)  Wt 372 lb 6.4 oz (168.92 kg)  BMI 47.81 kg/m2  SpO2 97% Physical Exam  VS noted Constitutional: Pt appears well-developed and well-nourished.  HENT: Head: Normocephalic.  Right Ear: External ear normal.  Left Ear: External ear normal.  Eyes: Conjunctivae and EOM are normal. Pupils are equal, round, and reactive to light.  Neck: Normal range of motion. Neck supple.  Cardiovascular: Normal rate and regular rhythm.   Pulmonary/Chest: Effort normal and breath sounds normal.  Neurological: Pt is alert. No cranial nerve deficit. motor/sens/dtr/gait intact Lumbar spine nontender, no swelling, rash Skin: Skin is warm. No erythema. except for right mid cheek with 1 cm area mild tender/erythema/induration without fluctuance Psychiatric: Pt behavior is normal. Thought content normal.     Assessment & Plan:

## 2011-09-25 NOTE — Assessment & Plan Note (Signed)
stable overall by hx and exam, most recent data reviewed with pt, and pt to continue medical treatment as before  BP Readings from Last 3 Encounters:  09/24/11 124/82  09/18/11 120/82  08/19/11 112/80

## 2011-09-25 NOTE — Assessment & Plan Note (Signed)
stable overall by hx and exam, most recent data reviewed with pt, and pt to continue medical treatment as before  Lab Results  Component Value Date   [facility] 46 08/17/2011

## 2011-09-25 NOTE — Assessment & Plan Note (Signed)
Mild to mod, for antibx course,  to f/u any worsening symptoms or concerns 

## 2011-09-27 ENCOUNTER — Other Ambulatory Visit: Payer: Self-pay | Admitting: Internal Medicine

## 2011-10-12 ENCOUNTER — Encounter: Payer: BC Managed Care – PPO | Admitting: *Deleted

## 2011-10-15 ENCOUNTER — Ambulatory Visit (INDEPENDENT_AMBULATORY_CARE_PROVIDER_SITE_OTHER): Payer: BC Managed Care – PPO | Admitting: *Deleted

## 2011-10-15 DIAGNOSIS — I2699 Other pulmonary embolism without acute cor pulmonale: Secondary | ICD-10-CM

## 2011-10-15 LAB — POCT INR: INR: 2.9

## 2011-11-21 ENCOUNTER — Other Ambulatory Visit: Payer: Self-pay | Admitting: Internal Medicine

## 2011-11-26 ENCOUNTER — Ambulatory Visit (INDEPENDENT_AMBULATORY_CARE_PROVIDER_SITE_OTHER): Payer: BC Managed Care – PPO | Admitting: *Deleted

## 2011-11-26 DIAGNOSIS — I2699 Other pulmonary embolism without acute cor pulmonale: Secondary | ICD-10-CM

## 2011-11-26 LAB — POCT INR: INR: 2.7

## 2011-12-07 HISTORY — PX: REPLACEMENT TOTAL KNEE: SUR1224

## 2011-12-10 ENCOUNTER — Other Ambulatory Visit: Payer: BC Managed Care – PPO

## 2011-12-10 ENCOUNTER — Encounter: Payer: Self-pay | Admitting: Internal Medicine

## 2011-12-10 ENCOUNTER — Ambulatory Visit (INDEPENDENT_AMBULATORY_CARE_PROVIDER_SITE_OTHER): Payer: BC Managed Care – PPO | Admitting: Internal Medicine

## 2011-12-10 VITALS — BP 120/72 | HR 91 | Temp 98.0°F

## 2011-12-10 DIAGNOSIS — N390 Urinary tract infection, site not specified: Secondary | ICD-10-CM

## 2011-12-10 DIAGNOSIS — Z7901 Long term (current) use of anticoagulants: Secondary | ICD-10-CM

## 2011-12-10 DIAGNOSIS — R350 Frequency of micturition: Secondary | ICD-10-CM

## 2011-12-10 DIAGNOSIS — I2699 Other pulmonary embolism without acute cor pulmonale: Secondary | ICD-10-CM

## 2011-12-10 LAB — POCT URINALYSIS DIPSTICK: Spec Grav, UA: 1.02

## 2011-12-10 MED ORDER — CEPHALEXIN 500 MG PO CAPS: 500.0000 mg | ORAL_CAPSULE | Freq: Three times a day (TID) | ORAL | Status: DC

## 2011-12-10 NOTE — Patient Instructions (Signed)
It was good to see you today. We'll start Keflex antibiotics for your bladder infection symptoms - Your prescription(s) have been submitted to your pharmacy. Please take as directed and contact our office if you believe you are having problem(s) with the medication(s). Any antibiotics can interact with Coumadin so please call the Coumadin clinic if you notice any change in bruising, bleeding or problems while on antibiotics Will send urine sample for culture. you will be called with these results once available - if any changes need to be made based on these results, you'll be notified at that time

## 2011-12-10 NOTE — Progress Notes (Signed)
  Subjective:    Patient ID: [name], male    DOB: 1956/09/16, 56 y.o.   MRN: [id number]  Urinary Frequency  This is a new problem. The current episode started in the past 7 days. The problem occurs every urination. The problem has been unchanged. The quality of the pain is described as burning. There has been no fever. He is not sexually active. There is no history of pyelonephritis. Associated symptoms include frequency and urgency. Pertinent negatives include no chills, flank pain, nausea or vomiting. Hematuria: trace "pink" He has tried increased fluids for the symptoms. The treatment provided no relief. His past medical history is significant for recurrent UTIs. There is no history of catheterization, kidney stones, a single kidney or a urological procedure.   Past Medical History  Diagnosis Date  . Diabetes mellitus     type II  . Hyperlipidemia   . Hypertension   . Kidney stones   . Obstructive sleep apnea   . DVT, lower extremity     right  . Embolus     pulmonary  . Depression   . Renal insufficiency   . Obesity   . BACK PAIN   . COLONIC POLYPS, HX OF   . DEGENERATIVE JOINT DISEASE, KNEES, BILATERAL   . DEPRESSION   . DIVERTICULOSIS, COLON   . HYPOGONADISM, MALE   . HYPOTENSION, ORTHOSTATIC   . OBESITY, MORBID   . RENAL INSUFFICIENCY   . Long term current use of anticoagulant     Review of Systems  Constitutional: Negative for chills.  Gastrointestinal: Negative for nausea and vomiting.  Genitourinary: Positive for urgency and frequency. Negative for flank pain. Hematuria: trace "pink"       Objective:   Physical Exam  BP 120/72  Pulse 91  Temp(Src) 98 F (36.7 C) (Oral)  SpO2 95% Wt Readings from Last 3 Encounters:  09/24/11 372 lb 6.4 oz (168.92 kg)  09/18/11 366 lb 1.9 oz (166.071 kg)  08/19/11 364 lb 8 oz (165.336 kg)   Gen: obese, NAD Lung: CTA B CV: RRR Abd: S, NT or flank pain GU: not examined  Lab Results  Component Value Date   WBC  8.0 05/25/2011   HGB 12.3* 05/25/2011   HCT 38.6* 05/25/2011   PLT 233.0 05/25/2011   GLUCOSE 118* 08/17/2011   CHOL 110 08/17/2011   TRIG 113.0 08/17/2011   HDL 41.10 08/17/2011   LDLDIRECT 121.2 05/25/2011   LDLCALC 46 08/17/2011   ALT 29 05/25/2011   ALT 29 05/25/2011   AST 21 05/25/2011   AST 21 05/25/2011   NA 140 08/17/2011   K 4.3 08/17/2011   CL 102 08/17/2011   CREATININE 1.1 08/17/2011   BUN 19 08/17/2011   CO2 29 08/17/2011   TSH 3.84 05/25/2011   PSA 0.93 05/25/2011   INR 2.7 11/26/2011   HGBA1C 8.5* 08/17/2011   MICROALBUR 4.0* 05/25/2011      Assessment & Plan:  Dysuria - classic symptoms UTI and personal history of prior UTI reviewed Mild hematuria on urine dip - also on chronic anticoagulations because of PE history  Will send for urine culture Start empiric Keflex x 7 days - advised of interaction with Coumadin and to call Coumadin clinic if problems with bruising or bleeding while on this medication Patient to call if symptoms worse or unimproved on therapy

## 2011-12-13 LAB — URINE CULTURE: Colony Count: >=100000

## 2011-12-16 ENCOUNTER — Ambulatory Visit (INDEPENDENT_AMBULATORY_CARE_PROVIDER_SITE_OTHER): Payer: BC Managed Care – PPO | Admitting: Internal Medicine

## 2011-12-16 ENCOUNTER — Other Ambulatory Visit (INDEPENDENT_AMBULATORY_CARE_PROVIDER_SITE_OTHER): Payer: BC Managed Care – PPO

## 2011-12-16 ENCOUNTER — Encounter: Payer: Self-pay | Admitting: Internal Medicine

## 2011-12-16 VITALS — BP 106/70 | HR 83 | Temp 97.8°F | Ht 74.0 in | Wt 365.5 lb

## 2011-12-16 DIAGNOSIS — Z7901 Long term (current) use of anticoagulants: Secondary | ICD-10-CM

## 2011-12-16 DIAGNOSIS — Z0000 Encounter for general adult medical examination without abnormal findings: Secondary | ICD-10-CM

## 2011-12-16 DIAGNOSIS — N390 Urinary tract infection, site not specified: Secondary | ICD-10-CM

## 2011-12-16 DIAGNOSIS — E119 Type 2 diabetes mellitus without complications: Secondary | ICD-10-CM

## 2011-12-16 DIAGNOSIS — Z01818 Encounter for other preprocedural examination: Secondary | ICD-10-CM

## 2011-12-16 MED ORDER — GLIPIZIDE 10 MG PO TABS: 10.0000 mg | ORAL_TABLET | Freq: Two times a day (BID) | ORAL | Status: DC

## 2011-12-16 NOTE — Patient Instructions (Addendum)
Please finish your antibiotics as you are taking Continue all other medications as before Please go to LAB in the Basement for the blood and/or urine tests to be done today You will be contacted regarding the referral for: stress test Please call the phone number [phone] (the PhoneTree System) for results of testing in 2-3 days;  When calling, simply dial the number, and when prompted enter the MRN number above (the Medical Record Number) and the # key, then the message should start. If the stress test is normal, you should be able to hold the coumadin for 5 days before the knee sugury, but take lovenox for the time when you are off the coumadin, with the last dose being approx 24 hrs before the knee surgury  OK to cancel the appt in March since you are here today  Please return in 6 mo with Lab testing done 3-5 days before

## 2011-12-17 LAB — BASIC METABOLIC PANEL
BUN: 23 mg/dL (ref 6–23)
Creatinine, Ser: 1.3 mg/dL (ref 0.4–1.5)
GFR: 76.29 mL/min (ref >60.00)
Glucose, Bld: 70 mg/dL (ref 70–99)
Potassium: 4.5 mEq/L (ref 3.5–5.1)

## 2011-12-17 LAB — LIPID PANEL
HDL: 37.4 mg/dL — ABNORMAL LOW (ref >39.00)
LDL Cholesterol: 51 mg/dL (ref 0–99)

## 2011-12-17 MED ORDER — INSULIN GLARGINE 100 UNIT/ML ~~LOC~~ SOLN: 60.0000 [IU] | Freq: Every day | SUBCUTANEOUS | Status: DC

## 2011-12-19 ENCOUNTER — Encounter: Payer: Self-pay | Admitting: Internal Medicine

## 2011-12-19 MED ORDER — ENOXAPARIN SODIUM 100 MG/ML ~~LOC~~ SOLN: SUBCUTANEOUS | Status: DC

## 2011-12-19 NOTE — Assessment & Plan Note (Signed)
Has mult CRF's; for stress test preop, ok for surgury if neg for ischemia

## 2011-12-19 NOTE — Assessment & Plan Note (Signed)
Will need bridge tx prior to surgury - to hold coumadin 5 days prior to surgury, for lovenox at that time, re-start coumadin post op

## 2011-12-19 NOTE — Assessment & Plan Note (Signed)
stable overall by hx and exam, most recent data reviewed with pt, and pt to continue medical treatment as before  Lab Results  Component Value Date   HGBA1C 8.4* 12/16/2011

## 2011-12-19 NOTE — Assessment & Plan Note (Signed)
stable overall by hx and exam, and pt to continue medical treatment as before 

## 2011-12-19 NOTE — Progress Notes (Signed)
Subjective:    Patient ID: [name], male    DOB: 09-18-1956, 56 y.o.   MRN: [id number]  HPI  Here to f/u and preop exam for left knee TKA per Dr Charlann Boxer next month; overall doing ok,  Pt denies chest pain, increased sob or doe, wheezing, orthopnea, PND, increased LE swelling, palpitations, dizziness or syncope.  Pt denies new neurological symptoms such as new headache, or facial or extremity weakness or numbness   Pt denies polydipsia, polyuria, or low sugar symptoms such as weakness or confusion improved with po intake.  Pt states overall good compliance with meds, trying to follow lower cholesterol, diabetic diet, wt overall stable but little exercise however.  No recent overall bleeding or bruising.  Has not had recent stress test.  Pt denies fever, wt loss, night sweats, loss of appetite, or other constitutional symptoms  Denies worsening depressive symptoms, suicidal ideation, or panic.   Denies urinary symptoms such as dysuria, frequency, urgency,or hematuria, finishing antibx for UTI Past Medical History  Diagnosis Date  . Diabetes mellitus     type II  . Hyperlipidemia   . Hypertension   . Kidney stones   . Obstructive sleep apnea   . DVT, lower extremity     right  . Embolus     pulmonary  . Depression   . Renal insufficiency   . Obesity   . BACK PAIN   . COLONIC POLYPS, HX OF   . DEGENERATIVE JOINT DISEASE, KNEES, BILATERAL   . DEPRESSION   . DIVERTICULOSIS, COLON   . HYPOGONADISM, MALE   . HYPOTENSION, ORTHOSTATIC   . OBESITY, MORBID   . RENAL INSUFFICIENCY   . Long term current use of anticoagulant    Past Surgical History  Procedure Date  . Knee surgury 2000 & 2003     cartilage damage  . Tonsillectomy and adenoidectomy   . Ankle surgury 2001 bilateral    with bone spurs and torn tendon  . Edg 2008    chronic Duodenitis    reports that he has quit smoking. He does not have any smokeless tobacco history on file. He reports that he does not drink alcohol or  use illicit drugs. family history includes Diabetes in his brother and sister; Hypertension in his mother; and Prostate cancer in his father. No Known Allergies Current Outpatient Prescriptions on File Prior to Visit  Medication Sig Dispense Refill  . albuterol (PROAIR HFA) 108 (90 BASE) MCG/ACT inhaler Inhale 2 puffs into the lungs every 4 (four) hours as needed. For shortness of breath       . aspirin (ECOTRIN LOW STRENGTH) 81 MG EC tablet Take 81 mg by mouth daily.        Jerrell Belfast Lancet Super Thin 30G MISC Use 1 stick as directed three times a day       . B-D ULTRAFINE III SHORT PEN 31G X 8 MM MISC       . DULoxetine (CYMBALTA) 60 MG capsule Take 60 mg by mouth daily.        Marland Kitchen ezetimibe (ZETIA) 10 MG tablet Take 10 mg by mouth daily.        Marland Kitchen glipiZIDE (GLUCOTROL) 10 MG tablet Take 1 tablet (10 mg total) by mouth 2 (two) times daily before a meal.  180 tablet  3  . glucose blood (ONE TOUCH ULTRA TEST) test strip 1 each by Other route. 1 strip three times a day       . hydrochlorothiazide  25 MG tablet Take 25 mg by mouth daily.        . insulin glargine (LANTUS SOLOSTAR) 100 UNIT/ML injection Inject 60 Units into the skin at bedtime.  45 mL  3  . lisinopril (PRINIVIL,ZESTRIL) 5 MG tablet Take 5 mg by mouth daily.        . metFORMIN (GLUCOPHAGE) 1000 MG tablet Take 1 tablet (1,000 mg total) by mouth 2 (two) times daily with a meal.  180 tablet  3  . simvastatin (ZOCOR) 80 MG tablet TAKE 1 TABLET BY MOUTH ONCE DAILY  90 tablet  3  . traMADol (ULTRAM) 50 MG tablet Take 50 mg by mouth. 1-2 by mouth four times per day as needed for pain       . warfarin (COUMADIN) 5 MG tablet TAKE AS DIRECTED BY COUMADIN CLINIC.  160 tablet  1   Review of Systems Review of Systems  Constitutional: Negative for diaphoresis, activity change, appetite change and unexpected weight change.  HENT: Negative for hearing loss, ear pain, facial swelling, mouth sores and neck stiffness.   Eyes: Negative for pain, redness  and visual disturbance.  Respiratory: Negative for shortness of breath and wheezing.   Cardiovascular: Negative for chest pain and palpitations.  Gastrointestinal: Negative for diarrhea, blood in stool, abdominal distention and rectal pain.  Genitourinary: Negative for hematuria, flank pain and decreased urine volume.  Musculoskeletal: Negative for myalgias and joint swelling.  Skin: Negative for color change and wound.  Neurological: Negative for syncope and numbness.  Hematological: Negative for adenopathy.  Psychiatric/Behavioral: Negative for hallucinations, self-injury, decreased concentration and agitation.      Objective:   Physical Exam BP 106/70  Pulse 83  Temp(Src) 97.8 F (36.6 C) (Oral)  Ht 6\' 2"  (1.88 m)  Wt 365 lb 8 oz (165.79 kg)  BMI 46.93 kg/m2  SpO2 94% Physical Exam  VS noted Constitutional: Pt is oriented to person, place, and time. Appears well-developed and well-nourished.  HENT:  Head: Normocephalic and atraumatic.  Right Ear: External ear normal.  Left Ear: External ear normal.  Nose: Nose normal.  Mouth/Throat: Oropharynx is clear and moist.  Eyes: Conjunctivae and EOM are normal. Pupils are equal, round, and reactive to light.  Neck: Normal range of motion. Neck supple. No JVD present. No tracheal deviation present.  Cardiovascular: Normal rate, regular rhythm, normal heart sounds and intact distal pulses.   Pulmonary/Chest: Effort normal and breath sounds normal.  Abdominal: Soft. Bowel sounds are normal. There is no tenderness.  Musculoskeletal: Normal range of motion. Exhibits no edema.  Lymphadenopathy:  Has no cervical adenopathy.  Neurological: Pt is alert and oriented to person, place, and time. Pt has normal reflexes. No cranial nerve deficit.  Skin: Skin is warm and dry. No rash noted.  Psychiatric:  Has  normal mood and affect. Behavior is normal.  Left knee with marked crepitus, decressed ROM, small effusion    Assessment & Plan:

## 2011-12-20 ENCOUNTER — Other Ambulatory Visit: Payer: Self-pay | Admitting: Internal Medicine

## 2011-12-20 DIAGNOSIS — Z01810 Encounter for preprocedural cardiovascular examination: Secondary | ICD-10-CM

## 2011-12-28 ENCOUNTER — Encounter (HOSPITAL_COMMUNITY): Payer: Self-pay | Admitting: Pharmacy Technician

## 2011-12-29 ENCOUNTER — Encounter (HOSPITAL_COMMUNITY)
Admission: RE | Admit: 2011-12-29 | Discharge: 2011-12-29 | Disposition: A | Discharge status: Home / Self Care | Payer: Worker's Compensation | Source: Ambulatory Visit | Attending: Internal Medicine | Admitting: Internal Medicine

## 2011-12-29 DIAGNOSIS — E119 Type 2 diabetes mellitus without complications: Secondary | ICD-10-CM

## 2011-12-29 DIAGNOSIS — Z01810 Encounter for preprocedural cardiovascular examination: Secondary | ICD-10-CM

## 2011-12-29 MED ORDER — REGADENOSON 0.4 MG/5ML IV SOLN
0.4000 mg | Freq: Once | INTRAVENOUS | Status: AC
  Administered 2011-12-29: 0.4 mg via INTRAVENOUS

## 2011-12-29 MED ORDER — REGADENOSON 0.4 MG/5ML IV SOLN
INTRAVENOUS | Status: AC
  Filled 2011-12-29: qty 5

## 2011-12-30 ENCOUNTER — Encounter (HOSPITAL_COMMUNITY)
Admission: RE | Admit: 2011-12-30 | Discharge: 2011-12-30 | Disposition: A | Discharge status: Home / Self Care | Payer: BC Managed Care – PPO | Source: Ambulatory Visit | Attending: Internal Medicine | Admitting: Internal Medicine

## 2011-12-30 DIAGNOSIS — Z01810 Encounter for preprocedural cardiovascular examination: Secondary | ICD-10-CM | POA: Insufficient documentation

## 2011-12-30 DIAGNOSIS — E785 Hyperlipidemia, unspecified: Secondary | ICD-10-CM | POA: Insufficient documentation

## 2011-12-30 DIAGNOSIS — E119 Type 2 diabetes mellitus without complications: Secondary | ICD-10-CM | POA: Insufficient documentation

## 2011-12-30 MED ORDER — TECHNETIUM TC 99M TETROFOSMIN IV KIT: 30.0000 | PACK | Freq: Once | INTRAVENOUS | Status: AC | PRN

## 2011-12-30 MED ORDER — TECHNETIUM TC 99M TETROFOSMIN IV KIT
30.0000 | PACK | Freq: Once | INTRAVENOUS | Status: AC | PRN
  Administered 2011-12-30: 30 via INTRAVENOUS

## 2011-12-30 MED ORDER — TECHNETIUM TC 99M TETROFOSMIN IV KIT
30.0000 | PACK | Freq: Once | INTRAVENOUS | Status: AC | PRN
  Administered 2011-12-29: 30 via INTRAVENOUS

## 2011-12-31 ENCOUNTER — Other Ambulatory Visit: Payer: Self-pay | Admitting: Internal Medicine

## 2012-01-04 ENCOUNTER — Encounter (HOSPITAL_COMMUNITY): Payer: Self-pay

## 2012-01-04 ENCOUNTER — Encounter (HOSPITAL_COMMUNITY)
Admission: RE | Admit: 2012-01-04 | Discharge: 2012-01-04 | Disposition: A | Discharge status: Home / Self Care | Payer: Worker's Compensation | Source: Ambulatory Visit | Attending: Orthopedic Surgery | Admitting: Orthopedic Surgery

## 2012-01-04 ENCOUNTER — Ambulatory Visit (HOSPITAL_COMMUNITY)
Admission: RE | Admit: 2012-01-04 | Discharge: 2012-01-04 | Disposition: A | Discharge status: Home / Self Care | Payer: BC Managed Care – PPO | Source: Ambulatory Visit | Attending: Orthopedic Surgery | Admitting: Orthopedic Surgery

## 2012-01-04 DIAGNOSIS — E119 Type 2 diabetes mellitus without complications: Secondary | ICD-10-CM | POA: Insufficient documentation

## 2012-01-04 DIAGNOSIS — Z01818 Encounter for other preprocedural examination: Secondary | ICD-10-CM | POA: Insufficient documentation

## 2012-01-04 DIAGNOSIS — I10 Essential (primary) hypertension: Secondary | ICD-10-CM | POA: Insufficient documentation

## 2012-01-04 LAB — DIFFERENTIAL
Eosinophils Relative: 3 % (ref 0–5)
Lymphs Abs: 2.3 10*3/uL (ref 0.7–4.0)
Monocytes Relative: 7 % (ref 3–12)
Neutro Abs: 5.8 10*3/uL (ref 1.7–7.7)

## 2012-01-04 LAB — URINALYSIS, ROUTINE W REFLEX MICROSCOPIC
Glucose, UA: NEGATIVE mg/dL
Nitrite: NEGATIVE
Protein, ur: NEGATIVE mg/dL
pH: 6 (ref 5.0–8.0)

## 2012-01-04 LAB — BASIC METABOLIC PANEL
CO2: 28 mEq/L (ref 19–32)
Chloride: 102 mEq/L (ref 96–112)
Sodium: 141 mEq/L (ref 135–145)

## 2012-01-04 LAB — SURGICAL PCR SCREEN: MRSA, PCR: POSITIVE — AB

## 2012-01-04 LAB — CBC
HCT: 40.4 % (ref 39.0–52.0)
MCV: 68.8 fL — ABNORMAL LOW (ref 78.0–100.0)
RBC: 5.87 MIL/uL — ABNORMAL HIGH (ref 4.22–5.81)
WBC: 9 10*3/uL (ref 4.0–10.5)

## 2012-01-04 LAB — URINE MICROSCOPIC-ADD ON

## 2012-01-04 LAB — APTT: aPTT: 42 seconds — ABNORMAL HIGH (ref 24–37)

## 2012-01-04 NOTE — Pre-Procedure Instructions (Signed)
Stress test with resting ekg 12-30-2011 on chart medicalmedical clearance note dr Jonny Ruiz on chart barimax bed with trapeze requested with [facility] portable equipment

## 2012-01-04 NOTE — Patient Instructions (Addendum)
20 [name]  01/04/2012   Your procedure is scheduled on: 01-10-2012  Report to [facility] Stay [facility] at 100 pm.  Call this number if you have problems the morning of surgery: [phone]   Remember:take 1/2 dose bedtime lantus insulin on 01-09-2012 (take 30 units ) Bring cpap mask only   Do not eat food:After Midnight.  May have clear liquids:up to 6 Hours before arrival.clear liquids midnight until 0900 am, then nothing  Clear liquids include soda, tea, black coffee, apple or grape juice, broth.  Take these medicines the morning of surgery with A SIP OF WATER:albuterol inhaler if needed and bring inhaler   Do not wear jewelry.  Do not wear lotions, powders, or perfumes.  Do not wear deodorant.   Do not bring valuables to the hospital.  Contacts, dentures or bridgework may not be worn into surgery.  Leave suitcase in the car. After surgery it may be brought to your room.  For patients admitted to the hospital, checkout time is 11:00 AM the day of discharge.     Special Instructions: CHG Shower Use Special Wash: 1/2 bottle night before surgery and 1/2 bottle morning of surgery.neck down avoid private area [name] rn [facility] pre op nurse phone number [phone]   Please read over the following fact sheets that you were given: MRSA Information.

## 2012-01-05 NOTE — Pre-Procedure Instructions (Signed)
Called pt and left message surgery time changed to 100 pm, please arrive 1100 am 01-10-12, clear liquids midnight until 0700 am day of surgery, then nothing

## 2012-01-06 NOTE — Pre-Procedure Instructions (Signed)
Left pt message surgery time changed to 300 pm, clear liquids midnight until 0900 am then npo, arrive 100pm short stay

## 2012-01-06 NOTE — Pre-Procedure Instructions (Signed)
Spoke with pt, aware surgery time changed to 320pm, clear liquids midnight until 920am, then nothing by mouth, arrive 120pm [facility] short stay

## 2012-01-07 ENCOUNTER — Encounter: Payer: BC Managed Care – PPO | Admitting: *Deleted

## 2012-01-09 NOTE — H&P (Signed)
[name] is an 56 y.o. male.    Chief Complaint: left knee OA and pain   HPI: Pt is a 56 y.o. male complaining of left knee pain since March of 2012.  Originally fell at work and Dr. Rennis [name] did a knee arthroscopy. The arthroscopy never really helped the pain.  Pain had continually increased since the beginning. X-rays in the clinic show end-stage arthritic changes of the left knee with bone-on-bone changes of the medial aspect. Pt has tried various conservative treatments which have failed to alleviate their symptoms. Various options are discussed with the patient. Risks, benefits and expectations were discussed with the patient. Patient understand the risks, benefits and expectations and wishes to proceed with surgery.   PCP:  [name] Barre, MD, MD  D/C Plans:  SNF/Rehab  Post-op Meds:  No Rx given  Tranexamic Acid:  Not to be given  PMH: Past Medical History  Diagnosis Date  . Diabetes mellitus     type II  . Hyperlipidemia   . Hypertension   . DVT, lower extremity     right  . Embolus 2005    pulmonary  . Depression   . Obesity   . BACK PAIN   . COLONIC POLYPS, HX OF   . DEGENERATIVE JOINT DISEASE, KNEES, BILATERAL   . DEPRESSION   . DIVERTICULOSIS, COLON   . HYPOGONADISM, MALE   . HYPOTENSION, ORTHOSTATIC   . OBESITY, MORBID   . Long term current use of anticoagulant   . Obstructive sleep apnea     uses cpap setting 8 to 10  . Kidney stones   . Renal insufficiency   . RENAL INSUFFICIENCY     PSH: Past Surgical History  Procedure Date  . Knee surgury 2000 & 2003     cartilage damage  . Ankle surgury 2001 bilateral    with bone spurs and torn tendon  . Edg 2008    chronic Duodenitis  . Tonsillectomy and adenoidectomy age 18 or 29    Social History:  reports that he quit smoking about 33 years ago. He does not have any smokeless tobacco history on file. He reports that he drinks alcohol. He reports that he does not use illicit drugs.  Allergies:  No  Known Allergies  Medications: No current facility-administered medications for this encounter.   Current Outpatient Prescriptions  Medication Sig Dispense Refill  . albuterol (PROAIR HFA) 108 (90 BASE) MCG/ACT inhaler Inhale 2 puffs into the lungs every 4 (four) hours as needed. For shortness of breath      . aspirin (ECOTRIN LOW STRENGTH) 81 MG EC tablet Take 81 mg by mouth daily before breakfast.       . [place] Lancet Super Thin 30G MISC Use 1 stick as directed three times a day       . B-D ULTRAFINE III SHORT PEN 31G X 8 MM MISC       . DULoxetine (CYMBALTA) 60 MG capsule Take 60 mg by mouth daily as needed. Depression       . enoxaparin (LOVENOX) 100 MG/ML SOLN 1 cc sq qd for 5 days, with last dose 24 hrs prior to day of surgury  5 mL  0  . glipiZIDE (GLUCOTROL) 10 MG tablet Take 1 tablet (10 mg total) by mouth 2 (two) times daily before a meal.  180 tablet  3  . glucose blood (ONE TOUCH ULTRA TEST) test strip 1 each by Other route. 1 strip three times a day       .  hydrochlorothiazide 25 MG tablet Take 25 mg by mouth daily before breakfast.       . insulin glargine (LANTUS SOLOSTAR) 100 UNIT/ML injection Inject 60 Units into the skin at bedtime.  45 mL  3  . lisinopril (PRINIVIL,ZESTRIL) 5 MG tablet Take 5 mg by mouth daily before breakfast.       . metFORMIN (GLUCOPHAGE) 1000 MG tablet Take 1 tablet (1,000 mg total) by mouth 2 (two) times daily with a meal.  180 tablet  3  . simvastatin (ZOCOR) 80 MG tablet every evening.       . traMADol (ULTRAM) 50 MG tablet Take 50 mg by mouth. 1-2 by mouth four times per day as needed for pain      . warfarin (COUMADIN) 5 MG tablet Take 10-12.5 mg by mouth daily before breakfast. 12.5 mg on Tuesday and Friday. All other days 10 mg.      . ZETIA 10 MG tablet TAKE 1 TABLET BY MOUTH ONCE A DAY  90 tablet  3    ROS: Review of Systems  Constitutional: Negative.   HENT: Negative.   Eyes: Negative.   Respiratory: Positive for shortness of breath.     Cardiovascular: Negative.   Gastrointestinal: Negative.   Genitourinary: Positive for frequency.  Musculoskeletal: Positive for myalgias and joint pain.  Skin: Negative.   Neurological: Negative.   Endo/Heme/Allergies: Negative.   Psychiatric/Behavioral: The patient has insomnia.      Physican Exam: Physical Exam  Constitutional: He is well-developed, well-nourished, and in no distress.  HENT:  Head: Normocephalic and atraumatic.  Nose: Nose normal.  Mouth/Throat: Oropharynx is clear and moist.  Eyes: Pupils are equal, round, and reactive to light.  Neck: Neck supple. No JVD present. No tracheal deviation present. No thyromegaly present.  Cardiovascular: Normal rate, regular rhythm, normal heart sounds and intact distal pulses.   Pulmonary/Chest: Effort normal and breath sounds normal. No stridor.  Abdominal: Soft. There is no tenderness. There is no guarding.  Musculoskeletal:       Left knee: He exhibits decreased range of motion (10-80 with course crepitus and pain), swelling and bony tenderness. He exhibits no effusion, no ecchymosis, no deformity, no laceration and no erythema. tenderness found.       Left ankle: He exhibits normal pulse.  Lymphadenopathy:    He has no cervical adenopathy.  Neurological: He is alert.  Skin: Skin is warm and dry.  Psychiatric: Affect normal.     Assessment/Plan Assessment: left knee OA and pain   Plan: Patient will undergo a left total knee arthroplasty on 01/10/2012. Risks benefits and expectation were discussed with the patient. Patient understand risks, benefits and expectation and wishes to proceed.   [name]   PAC  01/09/2012, 3:48 PM

## 2012-01-10 ENCOUNTER — Inpatient Hospital Stay (HOSPITAL_COMMUNITY)
Admission: RE | Admit: 2012-01-10 | Discharge: 2012-01-13 | DRG: 470 | Disposition: A | Discharge status: Skilled Nursing Facility | Payer: Worker's Compensation | Source: Ambulatory Visit | Attending: Orthopedic Surgery | Admitting: Orthopedic Surgery

## 2012-01-10 ENCOUNTER — Encounter (HOSPITAL_COMMUNITY): Payer: Self-pay | Admitting: *Deleted

## 2012-01-10 ENCOUNTER — Encounter (HOSPITAL_COMMUNITY)
Admission: RE | Disposition: A | Discharge status: Skilled Nursing Facility | Payer: Self-pay | Source: Ambulatory Visit | Attending: Orthopedic Surgery

## 2012-01-10 ENCOUNTER — Inpatient Hospital Stay (HOSPITAL_COMMUNITY): Payer: Worker's Compensation | Admitting: *Deleted

## 2012-01-10 DIAGNOSIS — I82509 Chronic embolism and thrombosis of unspecified deep veins of unspecified lower extremity: Secondary | ICD-10-CM | POA: Diagnosis present

## 2012-01-10 DIAGNOSIS — Z96659 Presence of unspecified artificial knee joint: Secondary | ICD-10-CM

## 2012-01-10 DIAGNOSIS — I2782 Chronic pulmonary embolism: Secondary | ICD-10-CM | POA: Diagnosis present

## 2012-01-10 DIAGNOSIS — Z7901 Long term (current) use of anticoagulants: Secondary | ICD-10-CM

## 2012-01-10 DIAGNOSIS — F329 Major depressive disorder, single episode, unspecified: Secondary | ICD-10-CM | POA: Diagnosis present

## 2012-01-10 DIAGNOSIS — E119 Type 2 diabetes mellitus without complications: Secondary | ICD-10-CM

## 2012-01-10 DIAGNOSIS — Z01812 Encounter for preprocedural laboratory examination: Secondary | ICD-10-CM

## 2012-01-10 DIAGNOSIS — I10 Essential (primary) hypertension: Secondary | ICD-10-CM | POA: Diagnosis present

## 2012-01-10 DIAGNOSIS — F3289 Other specified depressive episodes: Secondary | ICD-10-CM | POA: Diagnosis present

## 2012-01-10 DIAGNOSIS — N289 Disorder of kidney and ureter, unspecified: Secondary | ICD-10-CM | POA: Diagnosis present

## 2012-01-10 DIAGNOSIS — G4733 Obstructive sleep apnea (adult) (pediatric): Secondary | ICD-10-CM | POA: Diagnosis present

## 2012-01-10 DIAGNOSIS — M1710 Unilateral primary osteoarthritis, unspecified knee: Principal | ICD-10-CM | POA: Diagnosis present

## 2012-01-10 HISTORY — PX: TOTAL KNEE ARTHROPLASTY: SHX125

## 2012-01-10 LAB — GLUCOSE, CAPILLARY
Glucose-Capillary: 103 mg/dL — ABNORMAL HIGH (ref 70–99)
Glucose-Capillary: 122 mg/dL — ABNORMAL HIGH (ref 70–99)

## 2012-01-10 SURGERY — ARTHROPLASTY, KNEE, TOTAL
Anesthesia: Spinal | Site: Knee | Laterality: Left | Wound class: Clean

## 2012-01-10 MED ORDER — VANCOMYCIN HCL 1000 MG IV SOLR
1000.0000 mg | INTRAVENOUS | Status: DC | PRN
  Administered 2012-01-10: 1000 mg via INTRAVENOUS

## 2012-01-10 MED ORDER — DEXAMETHASONE SODIUM PHOSPHATE 4 MG/ML IJ SOLN
INTRAMUSCULAR | Status: DC | PRN
  Administered 2012-01-10: 10 mg via INTRAVENOUS

## 2012-01-10 MED ORDER — ONDANSETRON HCL 4 MG/2ML IJ SOLN
INTRAMUSCULAR | Status: DC | PRN
  Administered 2012-01-10: 4 mg via INTRAVENOUS

## 2012-01-10 MED ORDER — POLYETHYLENE GLYCOL 3350 17 G PO PACK
17.0000 g | PACK | Freq: Two times a day (BID) | ORAL | Status: DC
  Administered 2012-01-10 – 2012-01-13 (×5): 17 g via ORAL
  Filled 2012-01-10 (×9): qty 1

## 2012-01-10 MED ORDER — SODIUM CHLORIDE 0.9 % IR SOLN
Status: DC | PRN
  Administered 2012-01-10: 3000 mL

## 2012-01-10 MED ORDER — BUPIVACAINE IN DEXTROSE 0.75-8.25 % IT SOLN
INTRATHECAL | Status: DC | PRN
  Administered 2012-01-10: 1.8 mL via INTRATHECAL

## 2012-01-10 MED ORDER — ACETAMINOPHEN 650 MG RE SUPP: 650.0000 mg | Freq: Four times a day (QID) | RECTAL | Status: DC | PRN

## 2012-01-10 MED ORDER — DULOXETINE HCL 60 MG PO CPEP
60.0000 mg | ORAL_CAPSULE | Freq: Every day | ORAL | Status: DC
  Administered 2012-01-10 – 2012-01-13 (×4): 60 mg via ORAL
  Filled 2012-01-10 (×5): qty 1

## 2012-01-10 MED ORDER — EZETIMIBE 10 MG PO TABS
10.0000 mg | ORAL_TABLET | Freq: Every day | ORAL | Status: DC
  Administered 2012-01-10 – 2012-01-13 (×4): 10 mg via ORAL
  Filled 2012-01-10 (×5): qty 1

## 2012-01-10 MED ORDER — ROSUVASTATIN CALCIUM 20 MG PO TABS
20.0000 mg | ORAL_TABLET | Freq: Every day | ORAL | Status: DC
  Administered 2012-01-10 – 2012-01-11 (×2): 20 mg via ORAL
  Filled 2012-01-10 (×5): qty 1

## 2012-01-10 MED ORDER — HYDROMORPHONE HCL PF 1 MG/ML IJ SOLN
0.5000 mg | INTRAMUSCULAR | Status: DC | PRN
  Administered 2012-01-10 – 2012-01-11 (×2): 1 mg via INTRAVENOUS
  Administered 2012-01-11 – 2012-01-12 (×2): 2 mg via INTRAVENOUS
  Filled 2012-01-10 (×3): qty 1
  Filled 2012-01-10: qty 2

## 2012-01-10 MED ORDER — PHENOL 1.4 % MT LIQD: 1.0000 | OROMUCOSAL | Status: DC | PRN

## 2012-01-10 MED ORDER — METHOCARBAMOL 100 MG/ML IJ SOLN
500.0000 mg | Freq: Four times a day (QID) | INTRAVENOUS | Status: DC | PRN
  Filled 2012-01-10: qty 5

## 2012-01-10 MED ORDER — SODIUM CHLORIDE 0.9 % IV SOLN
INTRAVENOUS | Status: DC
  Administered 2012-01-10 – 2012-01-11 (×3): via INTRAVENOUS
  Filled 2012-01-10 (×9): qty 1000

## 2012-01-10 MED ORDER — HYDROCODONE-ACETAMINOPHEN 7.5-325 MG PO TABS
1.0000 | ORAL_TABLET | ORAL | Status: DC
  Administered 2012-01-10: 1 via ORAL
  Administered 2012-01-11 – 2012-01-12 (×7): 2 via ORAL
  Filled 2012-01-10: qty 1
  Filled 2012-01-10 (×3): qty 2
  Filled 2012-01-10: qty 1
  Filled 2012-01-10 (×4): qty 2

## 2012-01-10 MED ORDER — MENTHOL 3 MG MT LOZG: 1.0000 | LOZENGE | OROMUCOSAL | Status: DC | PRN

## 2012-01-10 MED ORDER — LACTATED RINGERS IV SOLN
INTRAVENOUS | Status: DC | PRN
  Administered 2012-01-10 (×3): via INTRAVENOUS

## 2012-01-10 MED ORDER — BUPIVACAINE-EPINEPHRINE PF 0.25-1:200000 % IJ SOLN
INTRAMUSCULAR | Status: DC | PRN
  Administered 2012-01-10: 60 mL

## 2012-01-10 MED ORDER — ONDANSETRON HCL 4 MG PO TABS: 4.0000 mg | ORAL_TABLET | Freq: Four times a day (QID) | ORAL | Status: DC | PRN

## 2012-01-10 MED ORDER — DIPHENHYDRAMINE HCL 25 MG PO CAPS: 25.0000 mg | ORAL_CAPSULE | Freq: Four times a day (QID) | ORAL | Status: DC | PRN

## 2012-01-10 MED ORDER — CEFAZOLIN SODIUM-DEXTROSE 2-3 GM-% IV SOLR
2.0000 g | Freq: Once | INTRAVENOUS | Status: AC
  Administered 2012-01-10: 2 g via INTRAVENOUS

## 2012-01-10 MED ORDER — ONDANSETRON HCL 4 MG/2ML IJ SOLN: 4.0000 mg | Freq: Four times a day (QID) | INTRAMUSCULAR | Status: DC | PRN

## 2012-01-10 MED ORDER — METOCLOPRAMIDE HCL 10 MG PO TABS: 5.0000 mg | ORAL_TABLET | Freq: Three times a day (TID) | ORAL | Status: DC | PRN

## 2012-01-10 MED ORDER — LISINOPRIL 5 MG PO TABS
5.0000 mg | ORAL_TABLET | Freq: Every day | ORAL | Status: DC
  Administered 2012-01-11 – 2012-01-13 (×3): 5 mg via ORAL
  Filled 2012-01-10 (×4): qty 1

## 2012-01-10 MED ORDER — BISACODYL 5 MG PO TBEC: 5.0000 mg | DELAYED_RELEASE_TABLET | Freq: Every day | ORAL | Status: DC | PRN

## 2012-01-10 MED ORDER — INSULIN GLARGINE 100 UNIT/ML ~~LOC~~ SOLN
60.0000 [IU] | Freq: Every day | SUBCUTANEOUS | Status: DC
  Administered 2012-01-10 – 2012-01-12 (×3): 60 [IU] via SUBCUTANEOUS
  Filled 2012-01-10: qty 3

## 2012-01-10 MED ORDER — PROPOFOL 10 MG/ML IV EMUL
INTRAVENOUS | Status: DC | PRN
  Administered 2012-01-10: 50 ug/kg/min via INTRAVENOUS
  Administered 2012-01-10: 140 ug/kg/min via INTRAVENOUS

## 2012-01-10 MED ORDER — WARFARIN SODIUM 10 MG PO TABS
10.0000 mg | ORAL_TABLET | Freq: Once | ORAL | Status: AC
  Administered 2012-01-10: 10 mg via ORAL
  Filled 2012-01-10: qty 1

## 2012-01-10 MED ORDER — GLIPIZIDE 10 MG PO TABS
10.0000 mg | ORAL_TABLET | Freq: Two times a day (BID) | ORAL | Status: DC
  Administered 2012-01-11 – 2012-01-13 (×5): 10 mg via ORAL
  Filled 2012-01-10 (×8): qty 1

## 2012-01-10 MED ORDER — HYDROMORPHONE HCL PF 1 MG/ML IJ SOLN: 0.2500 mg | INTRAMUSCULAR | Status: DC | PRN

## 2012-01-10 MED ORDER — ZOLPIDEM TARTRATE 5 MG PO TABS: 5.0000 mg | ORAL_TABLET | Freq: Every evening | ORAL | Status: DC | PRN

## 2012-01-10 MED ORDER — ASPIRIN EC 81 MG PO TBEC
81.0000 mg | DELAYED_RELEASE_TABLET | Freq: Every day | ORAL | Status: DC
  Administered 2012-01-11 – 2012-01-13 (×3): 81 mg via ORAL
  Filled 2012-01-10 (×4): qty 1

## 2012-01-10 MED ORDER — KETOROLAC TROMETHAMINE 30 MG/ML IJ SOLN
INTRAMUSCULAR | Status: DC | PRN
  Administered 2012-01-10: 30 mg via INTRAVENOUS

## 2012-01-10 MED ORDER — ENOXAPARIN SODIUM 40 MG/0.4ML ~~LOC~~ SOLN
40.0000 mg | Freq: Two times a day (BID) | SUBCUTANEOUS | Status: DC
  Administered 2012-01-11 – 2012-01-13 (×5): 40 mg via SUBCUTANEOUS
  Filled 2012-01-10 (×8): qty 0.4

## 2012-01-10 MED ORDER — PROMETHAZINE HCL 25 MG/ML IJ SOLN: 6.2500 mg | INTRAMUSCULAR | Status: DC | PRN

## 2012-01-10 MED ORDER — FERROUS SULFATE 325 (65 FE) MG PO TABS
325.0000 mg | ORAL_TABLET | Freq: Three times a day (TID) | ORAL | Status: DC
  Administered 2012-01-11 – 2012-01-13 (×7): 325 mg via ORAL
  Filled 2012-01-10 (×13): qty 1

## 2012-01-10 MED ORDER — MIDAZOLAM HCL 5 MG/5ML IJ SOLN
INTRAMUSCULAR | Status: DC | PRN
  Administered 2012-01-10: 2 mg via INTRAVENOUS

## 2012-01-10 MED ORDER — METOCLOPRAMIDE HCL 5 MG/ML IJ SOLN: 5.0000 mg | Freq: Three times a day (TID) | INTRAMUSCULAR | Status: DC | PRN

## 2012-01-10 MED ORDER — ALBUTEROL SULFATE HFA 108 (90 BASE) MCG/ACT IN AERS
2.0000 | INHALATION_SPRAY | RESPIRATORY_TRACT | Status: DC | PRN
  Filled 2012-01-10: qty 6.7

## 2012-01-10 MED ORDER — ACETAMINOPHEN 10 MG/ML IV SOLN
INTRAVENOUS | Status: DC | PRN
  Administered 2012-01-10: 1000 mg via INTRAVENOUS

## 2012-01-10 MED ORDER — ALUM & MAG HYDROXIDE-SIMETH 200-200-20 MG/5ML PO SUSP: 30.0000 mL | ORAL | Status: DC | PRN

## 2012-01-10 MED ORDER — DOCUSATE SODIUM 100 MG PO CAPS
100.0000 mg | ORAL_CAPSULE | Freq: Two times a day (BID) | ORAL | Status: DC
  Administered 2012-01-10 – 2012-01-13 (×6): 100 mg via ORAL
  Filled 2012-01-10 (×9): qty 1

## 2012-01-10 MED ORDER — HYDROCHLOROTHIAZIDE 25 MG PO TABS
25.0000 mg | ORAL_TABLET | Freq: Every day | ORAL | Status: DC
  Administered 2012-01-11 – 2012-01-13 (×3): 25 mg via ORAL
  Filled 2012-01-10 (×4): qty 1

## 2012-01-10 MED ORDER — INSULIN ASPART 100 UNIT/ML ~~LOC~~ SOLN
0.0000 [IU] | Freq: Three times a day (TID) | SUBCUTANEOUS | Status: DC
  Administered 2012-01-11: 5 [IU] via SUBCUTANEOUS
  Administered 2012-01-11 (×2): 3 [IU] via SUBCUTANEOUS
  Administered 2012-01-12: 5 [IU] via SUBCUTANEOUS
  Administered 2012-01-12 – 2012-01-13 (×2): 3 [IU] via SUBCUTANEOUS
  Administered 2012-01-13: 2 [IU] via SUBCUTANEOUS
  Filled 2012-01-10: qty 3

## 2012-01-10 MED ORDER — CEFAZOLIN SODIUM-DEXTROSE 2-3 GM-% IV SOLR: 2.0000 g | Freq: Four times a day (QID) | INTRAVENOUS | Status: DC

## 2012-01-10 MED ORDER — METFORMIN HCL 500 MG PO TABS
1000.0000 mg | ORAL_TABLET | Freq: Two times a day (BID) | ORAL | Status: DC
  Administered 2012-01-11 – 2012-01-13 (×5): 1000 mg via ORAL
  Filled 2012-01-10 (×8): qty 2

## 2012-01-10 MED ORDER — FLEET ENEMA 7-19 GM/118ML RE ENEM: 1.0000 | ENEMA | Freq: Once | RECTAL | Status: AC | PRN

## 2012-01-10 MED ORDER — ACETAMINOPHEN 325 MG PO TABS: 650.0000 mg | ORAL_TABLET | Freq: Four times a day (QID) | ORAL | Status: DC | PRN

## 2012-01-10 MED ORDER — FENTANYL CITRATE 0.05 MG/ML IJ SOLN
INTRAMUSCULAR | Status: DC | PRN
  Administered 2012-01-10: 100 ug via INTRAVENOUS

## 2012-01-10 MED ORDER — LACTATED RINGERS IV SOLN: INTRAVENOUS | Status: DC

## 2012-01-10 MED ORDER — METHOCARBAMOL 500 MG PO TABS
500.0000 mg | ORAL_TABLET | Freq: Four times a day (QID) | ORAL | Status: DC | PRN
  Administered 2012-01-10 – 2012-01-13 (×5): 500 mg via ORAL
  Filled 2012-01-10 (×5): qty 1

## 2012-01-10 SURGICAL SUPPLY — 58 items
BAG SPEC THK2 15X12 ZIP CLS (MISCELLANEOUS) ×1
BAG ZIPLOCK 12X15 (MISCELLANEOUS) ×2 IMPLANT
BANDAGE ELASTIC 6 VELCRO ST LF (GAUZE/BANDAGES/DRESSINGS) ×2 IMPLANT
BANDAGE ESMARK 6X9 LF (GAUZE/BANDAGES/DRESSINGS) ×1 IMPLANT
BLADE SAW SGTL 13.0X1.19X90.0M (BLADE) ×2 IMPLANT
BNDG ESMARK 6X9 LF (GAUZE/BANDAGES/DRESSINGS) ×2
BONE CEMENT GENTAMICIN (Cement) ×4 IMPLANT
BOWL SMART MIX CTS (DISPOSABLE) ×2 IMPLANT
CEMENT BONE GENTAMICIN 40 (Cement) ×2 IMPLANT
CLOTH BEACON ORANGE TIMEOUT ST (SAFETY) ×2 IMPLANT
CUFF TOURN SGL QUICK 34 (TOURNIQUET CUFF) ×2
CUFF TRNQT CYL 34X4X40X1 (TOURNIQUET CUFF) ×1 IMPLANT
DECANTER SPIKE VIAL GLASS SM (MISCELLANEOUS) IMPLANT
DERMABOND ADVANCED (GAUZE/BANDAGES/DRESSINGS) ×1
DERMABOND ADVANCED .7 DNX12 (GAUZE/BANDAGES/DRESSINGS) ×1 IMPLANT
DRAPE EXTREMITY T 121X128X90 (DRAPE) ×2 IMPLANT
DRAPE POUCH INSTRU U-SHP 10X18 (DRAPES) ×2 IMPLANT
DRAPE U-SHAPE 47X51 STRL (DRAPES) ×2 IMPLANT
DRSG AQUACEL AG ADV 3.5X10 (GAUZE/BANDAGES/DRESSINGS) ×2 IMPLANT
DRSG TEGADERM 4X4.75 (GAUZE/BANDAGES/DRESSINGS) ×2 IMPLANT
DURAPREP 26ML APPLICATOR (WOUND CARE) ×2 IMPLANT
ELECT REM PT RETURN 9FT ADLT (ELECTROSURGICAL) ×2
ELECTRODE REM PT RTRN 9FT ADLT (ELECTROSURGICAL) ×1 IMPLANT
EVACUATOR 1/8 PVC DRAIN (DRAIN) ×2 IMPLANT
FACESHIELD LNG OPTICON STERILE (SAFETY) ×10 IMPLANT
GAUZE SPONGE 2X2 8PLY STRL LF (GAUZE/BANDAGES/DRESSINGS) ×1 IMPLANT
GLOVE BIOGEL PI IND STRL 7.5 (GLOVE) ×1 IMPLANT
GLOVE BIOGEL PI IND STRL 8 (GLOVE) ×1 IMPLANT
GLOVE BIOGEL PI INDICATOR 7.5 (GLOVE) ×1
GLOVE BIOGEL PI INDICATOR 8 (GLOVE) ×1
GLOVE ECLIPSE 8.0 STRL XLNG CF (GLOVE) ×2 IMPLANT
GLOVE ORTHO TXT STRL SZ7.5 (GLOVE) ×4 IMPLANT
GOWN BRE IMP PREV XXLGXLNG (GOWN DISPOSABLE) ×2 IMPLANT
GOWN STRL NON-REIN LRG LVL3 (GOWN DISPOSABLE) ×2 IMPLANT
HANDPIECE INTERPULSE COAX TIP (DISPOSABLE) ×1
IMMOBILIZER KNEE 20 (SOFTGOODS) ×2
IMMOBILIZER KNEE 20 THIGH 36 (SOFTGOODS) ×1 IMPLANT
KIT BASIN OR (CUSTOM PROCEDURE TRAY) ×2 IMPLANT
MANIFOLD NEPTUNE II (INSTRUMENTS) ×2 IMPLANT
NDL SAFETY ECLIPSE 18X1.5 (NEEDLE) ×1 IMPLANT
NEEDLE HYPO 18GX1.5 SHARP (NEEDLE) ×1
NS IRRIG 1000ML POUR BTL (IV SOLUTION) ×4 IMPLANT
PACK TOTAL JOINT (CUSTOM PROCEDURE TRAY) ×2 IMPLANT
PENCIL BUTTON HOLSTER BLD 10FT (ELECTRODE) ×2 IMPLANT
POSITIONER SURGICAL ARM (MISCELLANEOUS) ×2 IMPLANT
SET HNDPC FAN SPRY TIP SCT (DISPOSABLE) ×1 IMPLANT
SET PAD KNEE POSITIONER (MISCELLANEOUS) ×2 IMPLANT
SPONGE GAUZE 2X2 STER 10/PKG (GAUZE/BANDAGES/DRESSINGS) ×1
SUCTION FRAZIER 12FR DISP (SUCTIONS) ×2 IMPLANT
SUT MNCRL AB 4-0 PS2 18 (SUTURE) ×2 IMPLANT
SUT VIC AB 1 CT1 36 (SUTURE) ×6 IMPLANT
SUT VIC AB 2-0 CT1 27 (SUTURE) ×6
SUT VIC AB 2-0 CT1 TAPERPNT 27 (SUTURE) ×3 IMPLANT
SYR 50ML LL SCALE MARK (SYRINGE) ×2 IMPLANT
TOWEL OR 17X26 10 PK STRL BLUE (TOWEL DISPOSABLE) ×4 IMPLANT
TRAY FOLEY CATH 14FRSI W/METER (CATHETERS) ×2 IMPLANT
WATER STERILE IRR 1500ML POUR (IV SOLUTION) ×2 IMPLANT
WRAP KNEE MAXI GEL POST OP (GAUZE/BANDAGES/DRESSINGS) ×2 IMPLANT

## 2012-01-10 NOTE — Preoperative (Signed)
Beta Blockers   Reason not to administer Beta Blockers:Not Applicable 

## 2012-01-10 NOTE — Transfer of Care (Signed)
Immediate Anesthesia Transfer of Care Note  Patient: [name]  Procedure(s) Performed:  TOTAL KNEE ARTHROPLASTY  Patient Location: PACU  Anesthesia Type: Spinal  Level of Consciousness: awake, alert  and oriented  Airway & Oxygen Therapy: Patient Spontanous Breathing and Patient connected to face mask oxygen  Post-op Assessment: Report given to PACU RN and Post -op Vital signs reviewed and stable  Post vital signs: Reviewed and stable  Complications: No apparent anesthesia complications

## 2012-01-10 NOTE — Progress Notes (Signed)
1325 DR. Charlann Boxer made aware that patient is Jehovah witness and does not want a blood transfusion,lab notified of the same,copy of blood refusal form faxed to the lab

## 2012-01-10 NOTE — Anesthesia Procedure Notes (Signed)
Spinal  Patient location during procedure: OR Staffing Performed by: anesthesiologist  Preanesthetic Checklist Completed: patient identified, site marked, surgical consent, pre-op evaluation, timeout performed, IV checked, risks and benefits discussed and monitors and equipment checked Spinal Block Patient position: sitting Prep: Betadine Patient monitoring: heart rate, continuous pulse ox and blood pressure Injection technique: single-shot Needle Needle type: Sprotte and Introducer  Needle gauge: 24 G Needle length: 12.7 cm Assessment Sensory level: T6 Additional Notes Expiration date of kit checked and confirmed. Patient tolerated procedure well, without complications.

## 2012-01-10 NOTE — Anesthesia Postprocedure Evaluation (Signed)
  Anesthesia Post-op Note  Patient: [name]  Procedure(s) Performed:  TOTAL KNEE ARTHROPLASTY  Patient Location: PACU  Anesthesia Type: General  Level of Consciousness: awake and alert   Airway and Oxygen Therapy: Patient Spontanous Breathing  Post-op Pain: mild  Post-op Assessment: Post-op Vital signs reviewed, Patient's Cardiovascular Status Stable, Respiratory Function Stable, Patent Airway and No signs of Nausea or vomiting  Post-op Vital Signs: stable  Complications: No apparent anesthesia complications

## 2012-01-10 NOTE — Progress Notes (Signed)
ANTICOAGULATION CONSULT NOTE - Initial Consult  Pharmacy Consult for Coumadin Indication: VTE prophylaxis, h/o PE  No Known Allergies  Patient Measurements:   Weight as of 1/[name]/2013 167.4 kg  Vital Signs: Temp: 97.5 F (36.4 C) (02/04 2047) Temp src: Oral (02/04 1240) BP: 113/68 mmHg (02/04 2047) Pulse Rate: 56  (02/04 2047)  Labs: No results found for this basename: HGB:2,HCT:3,PLT:3,APTT:3,LABPROT:3,INR:3,HEPARINUNFRC:3,CREATININE:3,CKTOTAL:3,CKMB:3,TROPONINI:3 in the last 72 hours The CrCl is unknown because both a height and weight (above a minimum accepted value) are required for this calculation.  Medical History: Past Medical History  Diagnosis Date  . Diabetes mellitus     type II  . Hyperlipidemia   . Hypertension   . DVT, lower extremity     right  . Embolus 2005    pulmonary  . Depression   . Obesity   . BACK PAIN   . COLONIC POLYPS, HX OF   . DEGENERATIVE JOINT DISEASE, KNEES, BILATERAL   . DEPRESSION   . DIVERTICULOSIS, COLON   . HYPOGONADISM, MALE   . HYPOTENSION, ORTHOSTATIC   . OBESITY, MORBID   . Long term current use of anticoagulant   . Obstructive sleep apnea     uses cpap setting 8 to 10  . Kidney stones   . Renal insufficiency   . RENAL INSUFFICIENCY    Medications:  Scheduled:    . aspirin EC  81 mg Oral QAC breakfast  .  ceFAZolin (ANCEF) IV  2 g Intravenous Once  .  ceFAZolin (ANCEF) IV  2 g Intravenous Q6H  . docusate sodium  100 mg Oral BID  . DULoxetine  60 mg Oral Daily  . enoxaparin  40 mg Subcutaneous Q12H  . ezetimibe  10 mg Oral Daily  . ferrous sulfate  325 mg Oral TID PC  . glipiZIDE  10 mg Oral BID AC  . hydrochlorothiazide  25 mg Oral QAC breakfast  . HYDROcodone-acetaminophen  1-2 tablet Oral Q4H  . insulin aspart  0-15 Units Subcutaneous TID WC  . insulin glargine  60 Units Subcutaneous QHS  . lisinopril  5 mg Oral QAC breakfast  . metFORMIN  1,000 mg Oral BID WC  . polyethylene glycol  17 g Oral BID  .  rosuvastatin  20 mg Oral QHS   Infusions:    . sodium chloride 0.9 % 1,000 mL with potassium chloride 10 mEq infusion    . DISCONTD: lactated ringers      Assessment:  [name] with h/o of PE on Coumadin PTA.  Pt was transitioned to Lovenox for 5 days for bridging prior to surgery.  TKA performed 2/4.  Coumadin per pharmacy consult was originally scheduled to start 2/5.  Dr. Victorino Dike contacted and gave telephone order to start Coumadin tonight (2/4)  Home Coumadin dose:  12.5 mg on Tues/Fri and 10 mg other days of the week.    Patient also has order for Lovenox 40 mg sq q12h (to start 2/5 at 0800).  Lovenox is to be d/c'd when INR >= 1.8 per MD.  Goal of Therapy:  INR 2-3   Plan:   Coumadin 10 mg po x 1 tonight  Daily PT/INR  D/C Lovenox when INR >= 1.8  Geoffry Paradise Thi 01/10/2012,9:25 PM

## 2012-01-10 NOTE — Anesthesia Preprocedure Evaluation (Signed)
Anesthesia Evaluation  Patient identified by MRN, date of birth, ID band Patient awake    Reviewed: Allergy & Precautions, H&P , NPO status , Patient's Chart, lab work & pertinent test results  Airway Mallampati: II TM Distance: >3 FB Neck ROM: Full    Dental No notable dental hx.    Pulmonary sleep apnea ,  clear to auscultation  Pulmonary exam normal       Cardiovascular hypertension, Regular Normal    Neuro/Psych Negative Neurological ROS  Negative Psych ROS   GI/Hepatic negative GI ROS, Neg liver ROS,   Endo/Other  Diabetes mellitus-Morbid obesity  Renal/GU Renal InsufficiencyRenal disease  Genitourinary negative   Musculoskeletal negative musculoskeletal ROS (+)   Abdominal   Peds negative pediatric ROS (+)  Hematology negative hematology ROS (+)   Anesthesia Other Findings   Reproductive/Obstetrics negative OB ROS                           Anesthesia Physical Anesthesia Plan  ASA: III  Anesthesia Plan: Spinal   Post-op Pain Management:    Induction:   Airway Management Planned:   Additional Equipment:   Intra-op Plan:   Post-operative Plan:   Informed Consent: I have reviewed the patients History and Physical, chart, labs and discussed the procedure including the risks, benefits and alternatives for the proposed anesthesia with the patient or authorized representative who has indicated his/her understanding and acceptance.   Dental advisory given  Plan Discussed with: CRNA  Anesthesia Plan Comments:         Anesthesia Quick Evaluation

## 2012-01-10 NOTE — Op Note (Signed)
NAME:  [name]                      MEDICAL RECORD NO.:  [id number]                             FACILITY:  [facility]      PHYSICIAN:  Madlyn Frankel. Charlann Boxer, M.D.  DATE OF BIRTH:  [birth year]      DATE OF PROCEDURE:  01/10/2012                                     OPERATIVE REPORT         PREOPERATIVE DIAGNOSIS:  Left knee osteoarthritis.      POSTOPERATIVE DIAGNOSIS:  Left knee osteoarthritis.      FINDINGS:  The patient was noted to have complete loss of cartilage and   bone-on-bone arthritis with associated osteophytes in the medial and patellofemoral compartments of   the knee with a significant synovitis and associated effusion.      PROCEDURE:  Left total knee replacement.      COMPONENTS USED:  DePuy rotating platform posterior stabilized knee   system, a size 5 femur, tibia, 12.5 mm insert, and 41 patellar   button.      SURGEON:  Madlyn Frankel. Charlann Boxer, M.D.      ASSISTANT:  Lanney Gins, PA-C.      ANESTHESIA:  Spinal.      SPECIMENS:  None.      COMPLICATION:  None.      DRAINS:  One Hemovac.  EBL: 100cc      TOURNIQUET TIME:   Total Tourniquet Time Documented: Thigh (Left) - 63 minutes .      The patient was stable to the recovery room.      INDICATION FOR PROCEDURE:  [name] is a 56 y.o. male patient of   mine.  The patient had been seen, evaluated, and treated conservatively in the   office with medication, activity modification, and injections.  The patient had   radiographic changes of bone-on-bone arthritis with endplate sclerosis and osteophytes noted.      The patient failed conservative measures including medication, injections, and activity modification, and at this point was ready for more definitive measures.   Based on the radiographic changes and failed conservative measures, the patient   decided to proceed with total knee replacement.  Risks of infection,   DVT, component failure, need for revision surgery, postop course, and   expectations were all   discussed and reviewed.  Consent was obtained for benefit of pain   relief.      PROCEDURE IN DETAIL:  The patient was brought to the operative theater.   Once adequate anesthesia, preoperative antibiotics, 2 gm of Ancef and 1 gm Vancomycin due to  being MRSA positive screening, administered, the patient was positioned supine with the left thigh tourniquet placed.  The  left lower extremity was prepped and draped in sterile fashion.  A time-   out was performed identifying the patient, planned procedure, and   extremity.      The left lower extremity was placed in the [facility] leg holder.  The leg was   exsanguinated, tourniquet elevated to 250 mmHg.  A midline incision was   made followed by median parapatellar arthrotomy.  Following initial   exposure,  attention was first directed to the patella.  Precut   measurement was noted to be 26 mm.  I resected down to 14 mm and used a   41 patellar button to restore patellar height as well as cover the cut   surface.      The lug holes were drilled and a metal shim was placed to protect the   patella from retractors and saw blades.      At this point, attention was now directed to the femur.  The femoral   canal was opened with a drill, irrigated to try to prevent fat emboli.  An   intramedullary rod was passed at 5 degrees valgus, 10 mm of bone was   resected off the distal femur.  Following this resection, the tibia was   subluxated anteriorly.  Using the extramedullary guide, 10 mm of bone was resected off   the proximal lateral tibia.  We confirmed the gap would be   stable medially and laterally with a 10 mm insert as well as confirmed   the cut was perpendicular in the coronal plane, checking with an alignment rod.      Once this was done, I sized the femur to be a size 5 in the anterior-   posterior dimension, chose a standard component based on medial and   lateral dimension.  The size 5 rotation block was  then pinned in   position anterior referenced using the C-clamp to set rotation.  The   anterior, posterior, and  chamfer cuts were made without difficulty nor   notching making certain that I was along the anterior cortex to help   with flexion gap stability.      The final box cut was made off the lateral aspect of distal femur.      At this point, the tibia was sized to be a size 5, the size 5 tray was   then pinned in position through the medial third of the tubercle,   drilled, and keel punched.  Trial reduction was now carried with a 5 femur,  5 MBT revision tibia tray, a 12.5 mm insert, and the 41 patella botton.  The knee was brought to   extension, full extension with good flexion stability with the patella   tracking through the trochlea without application of pressure.  Given   all these findings, the trial components removed.  Final components were   opened and cement was mixed.  The knee was irrigated with normal saline   solution and pulse lavage.  The synovial lining was   then injected with 0.25% Marcaine with epinephrine and 1 cc of Toradol,   total of 61 cc.      The knee was irrigated.  Final implants were then cemented onto clean and   dried cut surfaces of bone with the knee brought to extension with a 12.5   mm trial insert.      Once the cement had fully cured, the excess cement was removed   throughout the knee.  I confirmed I was satisfied with the range of   motion and stability, and the final 12.5 mm insert was chosen.  It was   placed into the knee.      The tourniquet had been let down at 62 minutes.  No significant   hemostasis required.  The medium Hemovac drain was placed deep.  The   extensor mechanism was then reapproximated using #1 Vicryl with the knee  in flexion.  The   remaining wound was closed with 2-0 Vicryl and running 4-0 Monocryl.   The knee was cleaned, dried, dressed sterilely using Dermabond and   Aquacel dressing.  Drain site  dressed separately.  The patient was then   brought to recovery room in stable condition, tolerating the procedure   well.   Please note that Physician Assistant, Lanney Gins, was present for the entirety of the case, and was utilized for pre-operative positioning, peri-operative retractor management, general facilitation of the procedure.  He was also utilized for primary wound closure at the end of the case.              Madlyn Frankel Charlann Boxer, M.D.

## 2012-01-10 NOTE — Interval H&P Note (Signed)
History and Physical Interval Note:  01/10/2012 3:24 PM  [name]  has presented today for surgery, with the diagnosis of Osteoarthritis of the Left Knee  The various methods of treatment have been discussed with the patient and family. After consideration of risks, benefits and other options for treatment, the patient has consented to  Procedure(s):LEFT TOTAL KNEE ARTHROPLASTY as a surgical intervention .  The patients' history has been reviewed, patient examined, no change in status, stable for surgery.  I have reviewed the patients' chart and labs.  Questions were answered to the patient's satisfaction.     Shelda Pal

## 2012-01-11 LAB — BASIC METABOLIC PANEL
BUN: 21 mg/dL (ref 6–23)
CO2: 24 mEq/L (ref 19–32)
Chloride: 100 mEq/L (ref 96–112)
GFR calc non Af Amer: 77 mL/min — ABNORMAL LOW (ref >90)
Glucose, Bld: 296 mg/dL — ABNORMAL HIGH (ref 70–99)
Potassium: 4.7 mEq/L (ref 3.5–5.1)
Sodium: 136 mEq/L (ref 135–145)

## 2012-01-11 LAB — PROTIME-INR: INR: 1.07 (ref 0.00–1.49)

## 2012-01-11 LAB — CBC
HCT: 36.3 % — ABNORMAL LOW (ref 39.0–52.0)
Hemoglobin: 11.7 g/dL — ABNORMAL LOW (ref 13.0–17.0)
RBC: 5.21 MIL/uL (ref 4.22–5.81)
WBC: 13.6 10*3/uL — ABNORMAL HIGH (ref 4.0–10.5)

## 2012-01-11 MED ORDER — CEFAZOLIN SODIUM-DEXTROSE 2-3 GM-% IV SOLR
2.0000 g | Freq: Four times a day (QID) | INTRAVENOUS | Status: AC
  Administered 2012-01-11 (×3): 2 g via INTRAVENOUS
  Filled 2012-01-11 (×3): qty 50

## 2012-01-11 MED ORDER — WARFARIN SODIUM 10 MG PO TABS
10.0000 mg | ORAL_TABLET | Freq: Once | ORAL | Status: AC
  Administered 2012-01-11: 10 mg via ORAL
  Filled 2012-01-11: qty 1

## 2012-01-11 NOTE — Evaluation (Addendum)
Physical Therapy Evaluation Patient Details Name: [name] MRN: [id number] DOB: 1956-01-18 Today's Date: 01/11/2012  Problem List:  Patient Active Problem List  Diagnoses  . DIABETES MELLITUS, TYPE II  . HYPOGONADISM, MALE  . HYPERLIPIDEMIA  . OBESITY, MORBID  . SMOKER  . DEPRESSION  . HYPERTENSION  . HYPOTENSION, ORTHOSTATIC  . DIVERTICULOSIS, COLON  . CONSTIPATION  . RENAL INSUFFICIENCY  . DEGENERATIVE JOINT DISEASE, KNEES, BILATERAL  . BACK PAIN  . CHANGE IN BOWELS  . ABDOMINAL PAIN, UPPER  . COLONIC POLYPS, HX OF  . CHEST PAIN  . Pulmonary embolism  . Long term current use of anticoagulant  . UTI (lower urinary tract infection)  . Back pain with radiation  . Diarrhea  . Abscess of face  . Preventative health care  . Preop exam for internal medicine  . S/P left knee replacement    Past Medical History:  Past Medical History  Diagnosis Date  . Diabetes mellitus     type II  . Hyperlipidemia   . Hypertension   . DVT, lower extremity     right  . Embolus 2005    pulmonary  . Depression   . Obesity   . BACK PAIN   . COLONIC POLYPS, HX OF   . DEGENERATIVE JOINT DISEASE, KNEES, BILATERAL   . DEPRESSION   . DIVERTICULOSIS, COLON   . HYPOGONADISM, MALE   . HYPOTENSION, ORTHOSTATIC   . OBESITY, MORBID   . Long term current use of anticoagulant   . Obstructive sleep apnea     uses cpap setting 8 to 10  . Kidney stones   . Renal insufficiency   . RENAL INSUFFICIENCY    Past Surgical History:  Past Surgical History  Procedure Date  . Knee surgury 2000 & 2003     cartilage damage  . Ankle surgury 2001 bilateral    with bone spurs and torn tendon  . Edg 2008    chronic Duodenitis  . Tonsillectomy and adenoidectomy age 74 or 74    PT Assessment/Plan/Recommendation PT Assessment Clinical Impression Statement: pt will benefit from PT to maximize independence fro next venue PT Recommendation/Assessment: Patient will need skilled PT in the acute care  venue PT Problem List: Decreased strength;Decreased range of motion;Decreased activity tolerance;Decreased mobility;Decreased knowledge of use of DME;Pain Barriers to Discharge: Decreased caregiver support PT Therapy Diagnosis : Difficulty walking PT Plan PT Frequency: 7X/week PT Treatment/Interventions: DME instruction;Gait training;Functional mobility training;Therapeutic activities;Therapeutic exercise;Patient/family education PT Recommendation Follow Up Recommendations: Skilled nursing facility;Home health PT (depending on progress and benefits) Equipment Recommended: Defer to next venue PT Goals  Acute Rehab PT Goals PT Goal Formulation: With patient Time For Goal Achievement: 6 days Pt will go Supine/Side to Sit: with supervision PT Goal: Supine/Side to Sit - Progress: Goal set today Pt will go Sit to Supine/Side: with supervision PT Goal: Sit to Supine/Side - Progress: Goal set today Pt will go Sit to Stand: with supervision PT Goal: Sit to Stand - Progress: Goal set today Pt will Ambulate: 51 - 150 feet;with supervision;with rolling walker PT Goal: Ambulate - Progress: Goal set today Pt will Go Up / Down Stairs: 3-5 stairs;with min assist;with crutches;with rail(s) (address if D/C's home) PT Goal: Up/Down Stairs - Progress: Goal set today Pt will Perform Home Exercise Program: with supervision, verbal cues required/provided PT Goal: Perform Home Exercise Program - Progress: Goal set today  PT Evaluation Precautions/Restrictions  Precautions Precautions: Knee Required Braces or Orthoses: Yes Knee Immobilizer: Discontinue once  straight leg raise with < 10 degree lag Restrictions LLE Weight Bearing: Weight bearing as tolerated Prior Functioning  Home Living Lives With: alone Type of Home: House Home Layout: One level Home Access: Stairs to enter Entrance Stairs-Rails: Right;Left Entrance Stairs-Number of Steps: 4-5 Home Adaptive Equipment: Straight cane;Crutches Prior  Function Level of Independence: Requires assistive device for independence;Independent with transfers;Independent with basic ADLs (uses cane sometimes) Able to Take Stairs?: Yes Cognition Cognition Arousal/Alertness: Awake/alert Overall Cognitive Status: Appears within functional limits for tasks assessed Orientation Level: Oriented X4 Sensation/Coordination   Extremity Assessment RUE Assessment RUE Assessment: Within Functional Limits LUE Assessment LUE Assessment: Within Functional Limits RLE Assessment RLE Assessment: Within Functional Limits LLE Assessment LLE Assessment:  (pt able to assist with SLR, ankle WFL) Mobility (including Balance) Bed Mobility Bed Mobility: Yes Supine to Sit: 1: +2 Total assist;HOB elevated (Comment degrees);With rails (pt65%) Supine to Sit Details (indicate cue type and reason): cues for technique Sitting - Scoot to Edge of Bed: 4: Min assist;With rail Sitting - Scoot to [place] of Bed Details (indicate cue type and reason): cues for positioning and use of UEs Transfers Transfers: Yes Sit to Stand: 4: Min assist;From bed;From elevated surface Sit to Stand Details (indicate cue type and reason): cues for hand and LE placement Stand to Sit: 1: +2 Total assist;To chair/3-in-1;With upper extremity assist Stand to Sit Details: pt=70%; +2 for control, LLE position and support; cues for hands and positon of LLE Ambulation/Gait Ambulation/Gait: Yes Ambulation/Gait Assistance: 4: Min assist Ambulation/Gait Assistance Details (indicate cue type and reason): cues for posture, sequence and breathing Ambulation Distance (Feet): 60 Feet Assistive device: Rolling walker Gait Pattern: Step-to pattern;Antalgic    Exercise  Total Joint Exercises Ankle Circles/Pumps: AROM;Both;10 reps Quad Sets: AROM;10 reps;Left End of Session PT - End of Session Equipment Utilized During Treatment: Left knee immobilizer Activity Tolerance: Patient tolerated treatment  well Patient left: in chair;with call bell in reach Nurse Communication: Mobility status for ambulation;Mobility status for transfers General Behavior During Session: [facility] for tasks performed Cognition: [facility] for tasks performed  [facility] 01/11/2012, 9:16 AM

## 2012-01-11 NOTE — Progress Notes (Signed)
Physical Therapy Treatment Patient Details Name: SHAUL TRAUTMAN MRN: [id number] DOB: 02-Dec-1956 Today's Date: 01/11/2012  PT Assessment/Plan  PT - Assessment/Plan Comments on Treatment Session: pt motivated, doing well; pt wanted to stay up in chair for lunch; declined back to bed at this time PT Plan: Discharge plan remains appropriate;Frequency remains appropriate PT Frequency: 7X/week Follow Up Recommendations: Skilled nursing facility;Home health PT Equipment Recommended: Defer to next venue PT Goals  Acute Rehab PT Goals Pt will Perform Home Exercise Program: with supervision, verbal cues required/provided PT Goal: Perform Home Exercise Program - Progress: Progressing toward goal  PT Treatment Precautions/Restrictions  Precautions Precautions: Knee Required Braces or Orthoses: Yes Knee Immobilizer: Discontinue once straight leg raise with < 10 degree lag Restrictions Weight Bearing Restrictions: No LLE Weight Bearing: Weight bearing as tolerated Mobility (including Balance)      Exercise  Total Joint Exercises Ankle Circles/Pumps: AROM;Both;10 reps Quad Sets: AROM;10 reps;Left Short Arc Quad: AAROM;Left;10 reps Heel Slides: AAROM;10 reps;Left Hip ABduction/ADduction: AAROM;Left;10 reps Straight Leg Raises: AAROM;Left;10 reps End of Session PT - End of Session Activity Tolerance: Patient tolerated treatment well Patient left: in chair;with call bell in reach Nurse Communication: Mobility status for ambulation;Mobility status for transfers General Behavior During Session: [facility] for tasks performed Cognition: [facility] for tasks performed  [facility] 01/11/2012, 1:53 PM

## 2012-01-11 NOTE — Progress Notes (Signed)
ANTICOAGULATION CONSULT NOTE - Follow-up  Pharmacy Consult for Coumadin Indication: VTE prophylaxis, h/o PE  No Known Allergies  Patient Measurements:   Weight as of 01/04/2012 167.4 kg  Vital Signs: Temp: 97.7 F (36.5 C) (02/05 0608) Temp src: Oral (02/04 2211) BP: 146/107 mmHg (02/05 0608) Pulse Rate: 73  (02/05 0608)  Labs:  Basename 01/11/12 0329  HGB 11.7*  HCT 36.3*  PLT 208  APTT --  LABPROT 14.1  INR 1.07  HEPARINUNFRC --  CREATININE 1.04  CKTOTAL --  CKMB --  TROPONINI --   The CrCl is unknown because both a height and weight (above a minimum accepted value) are required for this calculation.  Medical History: Past Medical History  Diagnosis Date  . Diabetes mellitus     type II  . Hyperlipidemia   . Hypertension   . DVT, lower extremity     right  . Embolus 2005    pulmonary  . Depression   . Obesity   . BACK PAIN   . COLONIC POLYPS, HX OF   . DEGENERATIVE JOINT DISEASE, KNEES, BILATERAL   . DEPRESSION   . DIVERTICULOSIS, COLON   . HYPOGONADISM, MALE   . HYPOTENSION, ORTHOSTATIC   . OBESITY, MORBID   . Long term current use of anticoagulant   . Obstructive sleep apnea     uses cpap setting 8 to 10  . Kidney stones   . Renal insufficiency   . RENAL INSUFFICIENCY    Medications:  Scheduled:     . aspirin EC  81 mg Oral QAC breakfast  .  ceFAZolin (ANCEF) IV  2 g Intravenous Once  .  ceFAZolin (ANCEF) IV  2 g Intravenous Q6H  . docusate sodium  100 mg Oral BID  . DULoxetine  60 mg Oral Daily  . enoxaparin  40 mg Subcutaneous Q12H  . ezetimibe  10 mg Oral Daily  . ferrous sulfate  325 mg Oral TID PC  . glipiZIDE  10 mg Oral BID AC  . hydrochlorothiazide  25 mg Oral QAC breakfast  . HYDROcodone-acetaminophen  1-2 tablet Oral Q4H  . insulin aspart  0-15 Units Subcutaneous TID WC  . insulin glargine  60 Units Subcutaneous QHS  . lisinopril  5 mg Oral QAC breakfast  . metFORMIN  1,000 mg Oral BID WC  . polyethylene glycol  17 g Oral  BID  . rosuvastatin  20 mg Oral QHS  . warfarin  10 mg Oral Once  . DISCONTD:  ceFAZolin (ANCEF) IV  2 g Intravenous Q6H   Infusions:     . sodium chloride 0.9 % 1,000 mL with potassium chloride 10 mEq infusion 100 mL/hr at 01/10/12 2200  . DISCONTD: lactated ringers     Warfarin Doses Post-op: 10mg   Assessment:  30 YOM with h/o of PE on Coumadin PTA s/p L TKA on 2/4.  Pt was transitioned to Lovenox for 5 days for bridging prior to surgery.     Home Coumadin dose:  12.5 mg on Tues/Fri and 10 mg other days of the week.    Patient also has order for Lovenox 40 mg sq q12h (to start 2/5 at 0800).  Lovenox is to be d/c'd when INR >= 1.8 per MD.  INR subtherepeutic as expected after first post-op dose of warfarin  Goal of Therapy:  INR 2-3   Plan:   Repeat 10mg  warfarin tonight  Daily PT/INR  D/C Lovenox when INR >= 1.8   Hessie Knows, PharmD, BCPS  01/11/2012 8:53 AM pager [phone]

## 2012-01-11 NOTE — Progress Notes (Signed)
Pt placed on auto-CPAP for the night.  Pt tolerating well.  VS stable.  Pt instructed to notify RN/ RT of any concerns.

## 2012-01-11 NOTE — Progress Notes (Signed)
FL2 in shadow chart for MD signature. SNF search initiated in [facility]. Pt's worker's comp case manager Odie Sera [phone] ) contacted and message left to call CSW for assistance with d/c planning. Will follow to assistance with d/c planning to SNF.

## 2012-01-11 NOTE — Progress Notes (Signed)
CSW assisting with d/c planning to SNF  Clinical info faxed to worker's comp CM for review . Will send updated PT/OT treatment notes 2/6. Will follow to assist with d/c planning.

## 2012-01-11 NOTE — Evaluation (Addendum)
Occupational Therapy Evaluation Patient Details Name: [name] MRN: [id number] DOB: 1956-05-09 Today's Date: 01/11/2012 Time in: 9:55 am Time out: 10:35 am Eval II [place]  Problem List:  Patient Active Problem List  Diagnoses  . DIABETES MELLITUS, TYPE II  . HYPOGONADISM, MALE  . HYPERLIPIDEMIA  . OBESITY, MORBID  . SMOKER  . DEPRESSION  . HYPERTENSION  . HYPOTENSION, ORTHOSTATIC  . DIVERTICULOSIS, COLON  . CONSTIPATION  . RENAL INSUFFICIENCY  . DEGENERATIVE JOINT DISEASE, KNEES, BILATERAL  . BACK PAIN  . CHANGE IN BOWELS  . ABDOMINAL PAIN, UPPER  . COLONIC POLYPS, HX OF  . CHEST PAIN  . Pulmonary embolism  . Long term current use of anticoagulant  . UTI (lower urinary tract infection)  . Back pain with radiation  . Diarrhea  . Abscess of face  . Preventative health care  . Preop exam for internal medicine  . S/P left knee replacement    Past Medical History:  Past Medical History  Diagnosis Date  . Diabetes mellitus     type II  . Hyperlipidemia   . Hypertension   . DVT, lower extremity     right  . Embolus 2005    pulmonary  . Depression   . Obesity   . BACK PAIN   . COLONIC POLYPS, HX OF   . DEGENERATIVE JOINT DISEASE, KNEES, BILATERAL   . DEPRESSION   . DIVERTICULOSIS, COLON   . HYPOGONADISM, MALE   . HYPOTENSION, ORTHOSTATIC   . OBESITY, MORBID   . Long term current use of anticoagulant   . Obstructive sleep apnea     uses cpap setting 8 to 10  . Kidney stones   . Renal insufficiency   . RENAL INSUFFICIENCY    Past Surgical History:  Past Surgical History  Procedure Date  . Knee surgury 2000 & 2003     cartilage damage  . Ankle surgury 2001 bilateral    with bone spurs and torn tendon  . Edg 2008    chronic Duodenitis  . Tonsillectomy and adenoidectomy age 13 or 6    OT Assessment/Plan/Recommendation OT Assessment Clinical Impression Statement: Pt will benefit from skilled OT services to increase his independence with ADL for next  venue of care. OT Recommendation/Assessment: Patient will need skilled OT in the acute care venue OT Problem List: Decreased strength;Decreased range of motion;Decreased knowledge of use of DME or AE;Pain;Decreased activity tolerance OT Therapy Diagnosis : Generalized weakness;Acute pain OT Plan OT Frequency: Min 1X/week OT Treatment/Interventions: Self-care/ADL training;Therapeutic activities;Patient/family education;DME and/or AE instruction OT Recommendation Follow Up Recommendations: Skilled nursing facility Equipment Recommended: Defer to next venue Individuals Consulted Consulted and Agree with Results and Recommendations: Patient OT Goals Acute Rehab OT Goals OT Goal Formulation: With patient ADL Goals Pt Will Perform Grooming: with min assist;Standing at sink ADL Goal: Grooming - Progress: Goal set today Pt Will Perform Lower Body Bathing: with min assist;Sit to stand from chair;Sit to stand from bed ADL Goal: Lower Body Bathing - Progress: Goal set today Pt Will Perform Lower Body Dressing: with min assist;Sit to stand from chair;Sit to stand from bed ADL Goal: Lower Body Dressing - Progress: Goal set today Pt Will Transfer to Toilet: with min assist;Ambulation;with DME;3-in-1 ADL Goal: Toilet Transfer - Progress: Goal set today Pt Will Perform Toileting - Clothing Manipulation: with min assist;Standing ADL Goal: Toileting - Clothing Manipulation - Progress: Goal set today  OT Evaluation Precautions/Restrictions  Precautions Precautions: Knee Required Braces or Orthoses: Yes Knee Immobilizer:  Discontinue once straight leg raise with < 10 degree lag Restrictions Weight Bearing Restrictions: No LLE Weight Bearing: Weight bearing as tolerated Prior Functioning Home Living Lives With: Spouse Type of Home: House Home Layout: One level Home Access: Stairs to enter Entrance Stairs-Rails: Doctor, general practice of Steps: 4-5 Bathroom Shower/Tub: Teacher, music: Standard Home Adaptive Equipment: Straight cane;Crutches Prior Function Level of Independence: Requires assistive device for independence;Independent with transfers;Independent with basic ADLs;Independent with homemaking with ambulation (uses cane sometimes) Able to Take Stairs?: Yes ADL ADL Eating/Feeding: Simulated;Independent Where Assessed - Eating/Feeding: Chair Grooming: Simulated;Wash/dry face;Set up Where Assessed - Grooming: Sitting, chair Upper Body Bathing: Performed;Chest;Right arm;Left arm;Abdomen;Set up Where Assessed - Upper Body Bathing: Sitting, chair;Unsupported Lower Body Bathing: Moderate assistance Lower Body Bathing Details (indicate cue type and reason): excluding periareas due to patient became alittle nauseous with reaching down to wash lower legs. assisted patient to recline back in chair and nsg tech informed that patient may like to wash periareas when up later for back to bed. Mod assist to wash lower legs and feet bilaterally. Where Assessed - Lower Body Bathing: Sitting, chair;Unsupported Upper Body Dressing: Simulated;Set up Where Assessed - Upper Body Dressing: Sitting, chair;Unsupported Lower Body Dressing: Simulated;+1 Total assistance Lower Body Dressing Details (indicate cue type and reason): total assist with bilateral socks. pt unable to reach to right food also due to pulling discomfort at left knee reported. Where Assessed - Lower Body Dressing: Unsupported;Sitting, chair Toilet Transfer: Not assessed Toilet Transfer Method: Not assessed Toileting - Clothing Manipulation: Not assessed Toileting - Hygiene: Not assessed Tub/Shower Transfer: Not assessed Tub/Shower Transfer Method: Not assessed ADL Comments: Pt glad to be able to do his bath for therapy. He tolerated well until increased reaching performed to wash lower legs. Pt became alittle nauseous and dizzy. Dizziness improved after a minute or so. Nsg in to give meds and  assisted OT to recline patient in chair due to nausea still present. Pt unable to tolerate standing to wash periareas this visit. Vision/Perception  Vision - History Baseline Vision: No visual deficits Cognition Cognition Arousal/Alertness: Awake/alert Overall Cognitive Status: Appears within functional limits for tasks assessed Orientation Level: Oriented X4 Sensation/Coordination Sensation Light Touch: Appears Intact Extremity Assessment RUE Assessment RUE Assessment: Within Functional Limits LUE Assessment LUE Assessment: Within Functional Limits Mobility  Bed Mobility Bed Mobility: Yes Supine to Sit: 1: +2 Total assist;HOB elevated (Comment degrees);With rails (pt65%) Supine to Sit Details (indicate cue type and reason): cues for technique Sitting - Scoot to Edge of Bed: 4: Min assist;With rail Sitting - Scoot to [place] of Bed Details (indicate cue type and reason): cues for positioning and use of UEs Transfers Sit to Stand: 4: Min assist;From bed;From elevated surface Sit to Stand Details (indicate cue type and reason): cues for hand and LE placement Stand to Sit: 1: +2 Total assist;To chair/3-in-1;With upper extremity assist Stand to Sit Details: pt=70%; +2 for control, LLE position and support; cues for hands and positon of LLE Exercises Total Joint Exercises Ankle Circles/Pumps: AROM;Both;10 reps Quad Sets: AROM;10 reps;Left End of Session OT - End of Session Activity Tolerance: Other (comment) (pt became nauseous and lightheaded.) Patient left: in chair;with call bell in reach General Behavior During Session: [facility] for tasks performed Cognition: [facility] for tasks performed   Lennox Laity [phone] 01/11/2012, 11:02 AM

## 2012-01-11 NOTE — Progress Notes (Signed)
Subjective: 1 Day Post-Op Procedure(s) (LRB): TOTAL KNEE ARTHROPLASTY (Left)   Patient reports pain as mild. No events.   Objective:   VITALS:   Filed Vitals:   01/11/12 0608  BP: 146/107  Pulse: 73  Temp: 97.7 F (36.5 C)  Resp: 16    Neurovascular intact Dorsiflexion/Plantar flexion intact Incision: dressing C/D/I No cellulitis present Compartment soft  LABS  Basename 01/11/12 0329  HGB 11.7*  HCT 36.3*  WBC 13.6*  PLT 208     Basename 01/11/12 0329  NA 136  K 4.7  BUN 21  CREATININE 1.04  GLUCOSE 296*     Assessment/Plan: 1 Day Post-Op Procedure(s) (LRB): TOTAL KNEE ARTHROPLASTY (Left)   Foley cath d/c'ed HV drain d/c'ed Advance diet Up with therapy D/C IV fluids Discharge to SNF , plan upon discharge   Anastasio Auerbach. Phyllip Claw   PAC  01/11/2012, 7:54 AM

## 2012-01-12 ENCOUNTER — Encounter (HOSPITAL_COMMUNITY): Payer: Self-pay | Admitting: Orthopedic Surgery

## 2012-01-12 LAB — CBC
Hemoglobin: 9.9 g/dL — ABNORMAL LOW (ref 13.0–17.0)
MCH: 22.2 pg — ABNORMAL LOW (ref 26.0–34.0)
MCV: 71.3 fL — ABNORMAL LOW (ref 78.0–100.0)
RBC: 4.46 MIL/uL (ref 4.22–5.81)

## 2012-01-12 LAB — GLUCOSE, CAPILLARY
Glucose-Capillary: 121 mg/dL — ABNORMAL HIGH (ref 70–99)
Glucose-Capillary: 203 mg/dL — ABNORMAL HIGH (ref 70–99)
Glucose-Capillary: 170 mg/dL — ABNORMAL HIGH (ref 70–99)

## 2012-01-12 LAB — BASIC METABOLIC PANEL
BUN: 27 mg/dL — ABNORMAL HIGH (ref 6–23)
CO2: 28 mEq/L (ref 19–32)
Calcium: 8.4 mg/dL (ref 8.4–10.5)
Creatinine, Ser: 1.26 mg/dL (ref 0.50–1.35)
Glucose, Bld: 148 mg/dL — ABNORMAL HIGH (ref 70–99)

## 2012-01-12 MED ORDER — OXYCODONE HCL 5 MG PO TABS
5.0000 mg | ORAL_TABLET | ORAL | Status: DC
  Administered 2012-01-12 – 2012-01-13 (×6): 15 mg via ORAL
  Filled 2012-01-12 (×6): qty 3

## 2012-01-12 MED ORDER — HYDROCODONE-ACETAMINOPHEN 7.5-325 MG PO TABS: 1.0000 | ORAL_TABLET | ORAL | Status: DC

## 2012-01-12 MED ORDER — WARFARIN SODIUM 2.5 MG PO TABS
12.5000 mg | ORAL_TABLET | Freq: Once | ORAL | Status: AC
  Administered 2012-01-12: 12.5 mg via ORAL
  Filled 2012-01-12: qty 1

## 2012-01-12 NOTE — Progress Notes (Signed)
CSW continues to assist with d/c planning. Updated PT notes faxed to [facility], CM, from [employer]. CM indicated that they're system was down so she wasn't able to retrieve info. CSW read info over the phone to CM. Worker's comp has not yet determined if they will approve ST SNF placement. CM will contact CSW when decision is made. CSW informed CM that [facility] has offered a ST SNF placement for this pt and contact info provided. Will follow to assist with d/c planning needs.

## 2012-01-12 NOTE — Progress Notes (Signed)
Physical Therapy Treatment Patient Details Name: [name] MRN: [id number] DOB: 10-21-1956 Today's Date: 01/12/2012  PT Assessment/Plan  PT - Assessment/Plan Comments on Treatment Session: pt progressing slowly; will benefit from STSNF for continued rehab PT Plan: Discharge plan remains appropriate PT Frequency: 7X/week Follow Up Recommendations: Skilled nursing facility Equipment Recommended: Defer to next venue PT Goals  Acute Rehab PT Goals PT Goal Formulation: With patient Pt will go Supine/Side to Sit: with supervision PT Goal: Supine/Side to Sit - Progress: Progressing toward goal Pt will go Sit to Supine/Side: with supervision PT Goal: Sit to Supine/Side - Progress: Progressing toward goal Pt will go Sit to Stand: with supervision PT Goal: Sit to Stand - Progress: Progressing toward goal Pt will Ambulate: 51 - 150 feet;with supervision;with rolling walker PT Goal: Ambulate - Progress: Progressing toward goal Pt will Go Up / Down Stairs: 3-5 stairs;with min assist;with crutches PT Goal: Up/Down Stairs - Progress: Not met Pt will Perform Home Exercise Program: with supervision, verbal cues required/provided PT Goal: Perform Home Exercise Program - Progress: Progressing toward goal  PT Treatment Precautions/Restrictions  Precautions Precautions: Knee Required Braces or Orthoses: Yes Knee Immobilizer: Discontinue once straight leg raise with < 10 degree lag Restrictions Weight Bearing Restrictions: No LLE Weight Bearing: Weight bearing as tolerated Mobility (including Balance) Bed Mobility Bed Mobility: Yes Supine to Sit: 3: Mod assist Supine to Sit Details (indicate cue type and reason): Max assist to support L LE off bed and HOB elevated 45' Sitting - Scoot to [place] of Bed: 4: Min assist Sitting - Scoot to [place] of Bed Details (indicate cue type and reason): increased time and Max assist to support L LE off bed  Sit to Supine: 4: Min assist Sit to Supine - Details  (indicate cue type and reason): min with LLE Transfers Transfers: Yes Sit to Stand: 4: Min assist;With upper extremity assist;From chair/3-in-1 Sit to Stand Details (indicate cue type and reason): cues for hand placement aand wt shift Stand to Sit: 4: Min assist;With upper extremity assist;To bed Stand to Sit Details: cues for hand and LLE position Ambulation/Gait Ambulation/Gait: Yes Ambulation/Gait Assistance: 4: Min assist Ambulation/Gait Assistance Details (indicate cue type and reason): cues for sequence and breathing Ambulation Distance (Feet): 120 Feet Assistive device: Rolling walker Gait Pattern: Step-to pattern;Decreased stance time - left;Trunk flexed Gait velocity: difficulty weight shifting and required increased time Stairs: No Wheelchair Mobility Wheelchair Mobility: No    Exercise  Total Joint Exercises Ankle Circles/Pumps: AROM;Both;10 reps Quad Sets: AROM;5 reps;Both Gluteal Sets: AROM;Both;10 reps Towel Squeeze: AROM;Both;10 reps Short Arc QuadBarbaraann Boys;Left;5 reps Heel Slides: AAROM;Left;5 reps Hip ABduction/ADduction: AAROM;Left;5 reps Straight Leg Raises: AAROM;Left;5 reps End of Session PT - End of Session Equipment Utilized During Treatment: Gait belt;Left knee immobilizer Activity Tolerance: Patient tolerated treatment well Patient left: in bed;with call bell in reach Nurse Communication: Mobility status for ambulation;Mobility status for transfers General Behavior During Session: [facility] for tasks performed Cognition: [facility] for tasks performed  [facility] 01/12/2012, 3:46 PM

## 2012-01-12 NOTE — Progress Notes (Signed)
Subjective: 2 Days Post-Op Procedure(s) (LRB): TOTAL KNEE ARTHROPLASTY (Left)   Patient reports pain as moderate. States that the pain keeps going up to about 7-8. No other events.  Objective:   VITALS:   Filed Vitals:   01/12/12 0456  BP: 118/72  Pulse: 66  Temp: 97.9 F (36.6 C)  Resp: 18    Neurovascular intact Dorsiflexion/Plantar flexion intact Incision: dressing C/D/I No cellulitis present Compartment soft  LABS  Basename 01/12/12 0441 01/11/12 0329  HGB 9.9* 11.7*  HCT 31.8* 36.3*  WBC 9.2 13.6*  PLT 180 208     Basename 01/12/12 0441 01/11/12 0329  NA 138 136  K 4.4 4.7  BUN 27* 21  CREATININE 1.26 1.04  GLUCOSE 148* 296*     Assessment/Plan: 2 Days Post-Op Procedure(s) (LRB): TOTAL KNEE ARTHROPLASTY (Left)  Pain medications changed Up with therapy Discharge to SNF when ready, approaching discharge.   Anastasio Auerbach Shikara Mcauliffe   PAC  01/12/2012, 9:11 AM

## 2012-01-12 NOTE — Progress Notes (Signed)
Physical Therapy Treatment Patient Details Name: [name] MRN: [id number] DOB: [birth year] Today's Date: 01/12/2012  L TKR POD #2 am session 11:00 - 11:45 1 te  1 ta  1 gt PT Assessment/Plan  PT - Assessment/Plan Comments on Treatment Session: Pt progressing slowly, limited by pain.  Requires + 2 to amb for safety.  Pt plans to D/C to SNF. PT Plan: Discharge plan remains appropriate Follow Up Recommendations: Skilled nursing facility Equipment Recommended: Defer to next venue PT Goals  Acute Rehab PT Goals PT Goal Formulation: With patient Pt will go Supine/Side to Sit: with supervision PT Goal: Supine/Side to Sit - Progress: Progressing toward goal Pt will go Sit to Supine/Side: with supervision PT Goal: Sit to Supine/Side - Progress: Progressing toward goal Pt will go Sit to Stand: with supervision PT Goal: Sit to Stand - Progress: Progressing toward goal Pt will Ambulate: 51 - 150 feet;with supervision;with rolling walker PT Goal: Ambulate - Progress: Progressing toward goal Pt will Go Up / Down Stairs: 3-5 stairs;with min assist;with crutches PT Goal: Up/Down Stairs - Progress: Not met Pt will Perform Home Exercise Program: with supervision, verbal cues required/provided PT Goal: Perform Home Exercise Program - Progress: Progressing toward goal  PT Treatment Precautions/Restrictions  Precautions Precautions: Knee Required Braces or Orthoses: Yes Knee Immobilizer: Discontinue once straight leg raise with < 10 degree lag Restrictions Weight Bearing Restrictions: No LLE Weight Bearing: Weight bearing as tolerated Mobility (including Balance) Bed Mobility Bed Mobility: Yes Supine to Sit: 3: Mod assist Supine to Sit Details (indicate cue type and reason): Max assist to support L LE off bed and HOB elevated 45' Sitting - Scoot to [place] of Bed: 4: Min assist Sitting - Scoot to [place] of Bed Details (indicate cue type and reason): increased time and Max assist to support L  LE off bed  Transfers Transfers: Yes Sit to Stand: 4: Min assist;From bed Sit to Stand Details (indicate cue type and reason): elevated bed and increased time Stand to Sit: 1: +2 Total assist Stand to Sit Details: Total assist + 2 pt 50% with 75% VC's on proper tech and hand placement Ambulation/Gait Ambulation/Gait: Yes Ambulation/Gait Assistance: 1: +2 Total assist Ambulation/Gait Assistance Details (indicate cue type and reason): Total assist + 2 pt 75% with chair closely behind and mild c/o feeling "goofy" and  8/10 L knee pain despite pre medicated Ambulation Distance (Feet): 45 Feet Assistive device: Rolling walker Gait Pattern: Step-to pattern;Decreased stance time - left;Trunk flexed Gait velocity: difficulty weight shifting and required increased time Stairs: No Wheelchair Mobility Wheelchair Mobility: No    Exercise  Pt given handout on HEP Total Joint Exercises Ankle Circles/Pumps: AROM;Both;10 reps Quad Sets: AROM;Both;10 reps Gluteal Sets: AROM;Both;10 reps Towel Squeeze: AROM;Both;10 reps Short Arc QuadBarbaraann [name];Left;5 reps Heel Slides: AAROM;Left;5 reps Hip ABduction/ADduction: AAROM;Left;5 reps Straight Leg Raises: AAROM;Left;5 reps End of Session PT - End of Session Equipment Utilized During Treatment: Gait belt;Left knee immobilizer Activity Tolerance: Patient limited by pain Patient left: in chair;with call bell in reach General Behavior During Session: [facility] for tasks performed Cognition: [facility] for tasks performed  [name]  PTA [facility]  Acute  Rehab Pager     [phone]

## 2012-01-12 NOTE — Progress Notes (Signed)
ANTICOAGULATION CONSULT NOTE - Follow Up Consult  Pharmacy Consult for Warfarin Indication: VTE prophylaxis, h/o PE  No Known Allergies  Patient Measurements: Height: 6\' 2"  (188 cm) Weight: 369 lb (167.377 kg) IBW/kg (Calculated) : 82.2    Vital Signs: Temp: 97.9 F (36.6 C) (02/06 0456) BP: 118/72 mmHg (02/06 0456) Pulse Rate: 66  (02/06 0456)  Labs:  Basename 01/12/12 0441 01/11/12 0329  HGB 9.9* 11.7*  HCT 31.8* 36.3*  PLT 180 208  APTT -- --  LABPROT 15.2 14.1  INR 1.18 1.07  HEPARINUNFRC -- --  CREATININE 1.26 1.04  CKTOTAL -- --  CKMB -- --  TROPONINI -- --   Estimated Creatinine Clearance: 105.1 ml/min (by C-G formula based on Cr of 1.26).   Medications:  Scheduled:    . aspirin EC  81 mg Oral QAC breakfast  .  ceFAZolin (ANCEF) IV  2 g Intravenous Q6H  . docusate sodium  100 mg Oral BID  . DULoxetine  60 mg Oral Daily  . enoxaparin  40 mg Subcutaneous Q12H  . ezetimibe  10 mg Oral Daily  . ferrous sulfate  325 mg Oral TID PC  . glipiZIDE  10 mg Oral BID AC  . hydrochlorothiazide  25 mg Oral QAC breakfast  . HYDROcodone-acetaminophen  1-2 tablet Oral Q4H  . insulin aspart  0-15 Units Subcutaneous TID WC  . insulin glargine  60 Units Subcutaneous QHS  . lisinopril  5 mg Oral QAC breakfast  . metFORMIN  1,000 mg Oral BID WC  . polyethylene glycol  17 g Oral BID  . rosuvastatin  20 mg Oral QHS  . warfarin  10 mg Oral ONCE-1800  . DISCONTD: HYDROcodone-acetaminophen  1-2 tablet Oral Q4H    Assessment: -56 yo male on Coumadin PTA for h/o PE s/p L TKA on 2/4 -Home coumadin regimen: 12.5mg  T/F and 10mg  on other days -Pt on concurrent Lovenox 40mg  Q12h until INR>=1.8 per MD -INR below goal -Post op Coumadin dose: 10mg  (2/4 and 2/5) -No bleeding reported  Goal of Therapy:  INR 2-3   Plan:  1. Warfarin 12.5mg  po x1 today 2. D/C Lovenox when INR>=1.8  Dorethea Clan 01/12/2012,8:16 AM

## 2012-01-13 LAB — GLUCOSE, CAPILLARY: Glucose-Capillary: 198 mg/dL — ABNORMAL HIGH (ref 70–99)

## 2012-01-13 MED ORDER — WARFARIN SODIUM 5 MG PO TABS: 10.0000 mg | ORAL_TABLET | Freq: Every day | ORAL | Status: DC

## 2012-01-13 MED ORDER — WARFARIN SODIUM 2.5 MG PO TABS
12.5000 mg | ORAL_TABLET | Freq: Once | ORAL | Status: DC
  Filled 2012-01-13: qty 1

## 2012-01-13 MED ORDER — ENOXAPARIN SODIUM 40 MG/0.4ML ~~LOC~~ SOLN: 40.0000 mg | Freq: Two times a day (BID) | SUBCUTANEOUS | Status: DC

## 2012-01-13 MED ORDER — OXYCODONE HCL 5 MG PO TABS
10.0000 mg | ORAL_TABLET | ORAL | Status: DC
  Administered 2012-01-13: 20 mg via ORAL
  Filled 2012-01-13: qty 4

## 2012-01-13 MED ORDER — METHOCARBAMOL 500 MG PO TABS: 500.0000 mg | ORAL_TABLET | Freq: Four times a day (QID) | ORAL | Status: AC | PRN

## 2012-01-13 MED ORDER — FERROUS SULFATE 325 (65 FE) MG PO TABS: 325.0000 mg | ORAL_TABLET | Freq: Three times a day (TID) | ORAL | Status: DC

## 2012-01-13 MED ORDER — OXYCODONE HCL 10 MG PO TABS: 10.0000 mg | ORAL_TABLET | ORAL | Status: AC | PRN

## 2012-01-13 MED ORDER — DIPHENHYDRAMINE HCL 25 MG PO CAPS: 25.0000 mg | ORAL_CAPSULE | Freq: Four times a day (QID) | ORAL | Status: AC | PRN

## 2012-01-13 MED ORDER — ACETAMINOPHEN 325 MG PO TABS: 650.0000 mg | ORAL_TABLET | Freq: Four times a day (QID) | ORAL | Status: DC | PRN

## 2012-01-13 MED ORDER — POLYETHYLENE GLYCOL 3350 17 G PO PACK: 17.0000 g | PACK | Freq: Two times a day (BID) | ORAL | Status: AC

## 2012-01-13 MED ORDER — DSS 100 MG PO CAPS: 100.0000 mg | ORAL_CAPSULE | Freq: Two times a day (BID) | ORAL | Status: AC

## 2012-01-13 MED ORDER — KETOROLAC TROMETHAMINE 15 MG/ML IJ SOLN
15.0000 mg | Freq: Four times a day (QID) | INTRAMUSCULAR | Status: DC
  Administered 2012-01-13 (×2): 15 mg via INTRAVENOUS
  Filled 2012-01-13 (×5): qty 1

## 2012-01-13 NOTE — Progress Notes (Signed)
ANTICOAGULATION CONSULT NOTE - Follow Up Consult  Pharmacy Consult for Warfarin Indication: h/o PE  No Known Allergies  Patient Measurements: Height: 6\' 2"  (188 cm) Weight: 369 lb (167.377 kg) IBW/kg (Calculated) : 82.2    Vital Signs: Temp: 98.3 F (36.8 C) (02/07 0635) Temp src: Oral (02/07 0635) BP: 120/77 mmHg (02/07 0635) Pulse Rate: 77  (02/07 0635)  Labs:  Basename 01/13/12 0340 01/12/12 0441 01/11/12 0329  HGB -- 9.9* 11.7*  HCT -- 31.8* 36.3*  PLT -- 180 208  APTT -- -- --  LABPROT 15.6* 15.2 14.1  INR 1.21 1.18 1.07  HEPARINUNFRC -- -- --  CREATININE -- 1.26 1.04  CKTOTAL -- -- --  CKMB -- -- --  TROPONINI -- -- --   Estimated Creatinine Clearance: 105.1 ml/min (by C-G formula based on Cr of 1.26).   Medications:  Scheduled:     . aspirin EC  81 mg Oral QAC breakfast  . docusate sodium  100 mg Oral BID  . DULoxetine  60 mg Oral Daily  . enoxaparin  40 mg Subcutaneous Q12H  . ezetimibe  10 mg Oral Daily  . ferrous sulfate  325 mg Oral TID PC  . glipiZIDE  10 mg Oral BID AC  . hydrochlorothiazide  25 mg Oral QAC breakfast  . insulin aspart  0-15 Units Subcutaneous TID WC  . insulin glargine  60 Units Subcutaneous QHS  . ketorolac  15 mg Intravenous Q6H  . lisinopril  5 mg Oral QAC breakfast  . metFORMIN  1,000 mg Oral BID WC  . oxyCODONE  5-15 mg Oral Q4H  . polyethylene glycol  17 g Oral BID  . rosuvastatin  20 mg Oral QHS  . warfarin  12.5 mg Oral ONCE-1800    Patient's usual warfarin dosage is reportedly 10mg  daily except 12.5mg  on Tuesdays and Fridays.  Inpatient warfarin doses this admission: 10, 10, 12.5mg   No bleeding reported.   Assessment: -56 yo male on Coumadin PTA for h/o PE, now s/p L TKA (01/10/12) -INR beginning to respond -On concurrent Lovenox 40mg  Q12h until INR>=1.8 per MD -Concomitant ASA, Toradol noted  Goal of Therapy:  INR 2-3   Plan:  1. Repeat warfarin 12.5mg  po x1 today 2. Recheck INR tomorrow AM 3. D/C  Lovenox when INR>=1.8  Elie Goody, PharmD, BCPS Pager: [phone] 01/13/2012  10:14 AM

## 2012-01-13 NOTE — Discharge Summary (Signed)
Physician Discharge Summary  Patient ID: [name] MRN: [id number] DOB/AGE: 1955/12/16 56 y.o.  Admit date: 01/10/2012 Discharge date: 01/13/2012  Procedures:  Procedure(s) (LRB): TOTAL KNEE ARTHROPLASTY (Left)  Attending Physician: [name] Pal, MD   Admission Diagnoses: Left knee OA and pain   Discharge Diagnoses:  Principal Problem:  *S/P left knee replacement Diabetes mellitus - type II   Hyperlipidemia   Hypertension   DVT, lower extremity right   Embolus 2005 pulmonary   Depression   Obesity   BACK PAIN   COLONIC POLYPS, HX OF   DEGENERATIVE JOINT DISEASE, KNEES, BILATERAL   HYPOGONADISM, MALE   Long term current use of anticoagulant   Obstructive sleep apnea  - uses cpap setting 8 to 10   Kidney stones   Renal insufficiency   HPI: Pt is a 56 y.o. male complaining of left knee pain since March of 2012. Originally fell at work and Dr. Rennis [name] did a knee arthroscopy. The arthroscopy never really helped the pain. Pain had continually increased since the beginning. X-rays in the clinic show end-stage arthritic changes of the left knee with bone-on-bone changes of the medial aspect. Pt has tried various conservative treatments which have failed to alleviate their symptoms. Various options are discussed with the patient. Risks, benefits and expectations were discussed with the patient. Patient understand the risks, benefits and expectations and wishes to proceed with surgery.   PCP: [name] Barre, MD, MD   Discharged Condition: good  Hospital Course:  Patient underwent the above stated procedure on 01/10/2012. Patient tolerated the procedure well and brought to the recovery room in good condition and subsequently to the floor.  POD #1 BP: 146/107 ; Pulse: 73 ; Temp: 97.7 F (36.5 C) ; Resp: 16  Pt's foley was removed, as well as the hemovac drain removed. IV was changed to a saline lock. Patient reports pain as moderate. No events. Neurovascular intact,  dorsiflexion/plantar flexion intact, incision: dressing C/[name]/I, no cellulitis present and compartment soft.  LABS  Basename  01/11/12 0329   HGB  11.7*   HCT  36.3*    POD #2  BP: 118/72 ; Pulse: 66 ; Temp: 97.9 F (36.6 C) ; Resp: 18  Patient reports pain as moderate. States that the pain keeps going up to about 7-8. No other events. Neurovascular intact, dorsiflexion/plantar flexion intact, incision: dressing C/[name]/I, no cellulitis present and compartment soft.  LABS  Basename  01/12/12 0441    HGB  9.9*    HCT  31.8*     POD #3  BP: 120/77 ; Pulse: 77 ; Temp: 98.3 F (36.8 C) ; Resp: 16  Patient reports pain as moderate. Some pain throughout the night. Otherwise no events. Feels ready to be discharged to SNF Neurovascular intact, dorsiflexion/plantar flexion intact, incision: dressing C/[name]/I, no cellulitis present and compartment soft.  LABS  No new labs   Discharge Exam: Extremities: Homans sign is negative, no sign of DVT, no edema, redness or tenderness in the calves or thighs and no ulcers, gangrene or trophic changes  Disposition: SNF with follow up in 2 weeks  Follow-up Information    Follow up with [name],[name] in 2 weeks.   Contact information:   [facility] [address] [place] [address] [phone]          Discharge Orders    Future Appointments: Provider: Department: Dept Phone: Center:   06/14/2012 9:00 AM [name] Barre, MD [facility] [phone] [facility]  Future Orders Please Complete By Expires   Diet - low sodium heart healthy      Call MD / Call 911      Comments:   If you experience chest pain or shortness of breath, CALL 911 and be transported to the hospital emergency room.  If you develope a fever above 101 F, pus (white drainage) or increased drainage or redness at the wound, or calf pain, call your surgeon's office.   Discharge instructions      Comments:   Maintain surgical dressing for 8 days,  then replace with gauze and tape. Keep the area dry and clean until follow up. Follow up in 2 weeks at [facility]. Call with any questions or concerns.     Constipation Prevention      Comments:   Drink plenty of fluids.  Prune juice may be helpful.  You may use a stool softener, such as Colace (over the counter) 100 mg twice a day.  Use MiraLax (over the counter) for constipation as needed.   Increase activity slowly as tolerated      Weight Bearing as taught in Physical Therapy      Comments:   Use a walker or crutches as instructed.   Driving restrictions      Comments:   No driving for 4 weeks   TED hose      Comments:   Use stockings (TED hose) for 2 weeks on both leg(s).  You may remove them at night for sleeping.   Change dressing      Comments:   Maintain surgical dressing for 8 days, then change the dressing daily with sterile 4 x 4 inch gauze dressing and tape. Keep the area dry and clean.      Current Discharge Medication List    START taking these medications   Details  acetaminophen (TYLENOL) 325 MG tablet Take 2 tablets (650 mg total) by mouth every 6 (six) hours as needed (or Fever >/= 101).    diphenhydrAMINE (BENADRYL) 25 mg capsule Take 1 capsule (25 mg total) by mouth every 6 (six) hours as needed for itching, allergies or sleep.    docusate sodium 100 MG CAPS Take 100 mg by mouth 2 (two) times daily.    ferrous sulfate 325 (65 FE) MG tablet Take 1 tablet (325 mg total) by mouth 3 (three) times daily after meals.    methocarbamol (ROBAXIN) 500 MG tablet Take 1 tablet (500 mg total) by mouth every 6 (six) hours as needed (muscle spasms). Qty: 50 tablet, Refills: 0    oxyCODONE 10 MG TABS Take 1-2 tablets (10-20 mg total) by mouth every 4 (four) hours as needed for pain. Qty: 120 tablet, Refills: 0    polyethylene glycol (MIRALAX / GLYCOLAX) packet Take 17 g by mouth 2 (two) times daily.      CONTINUE these medications which have CHANGED    Details  enoxaparin (LOVENOX) 40 MG/0.4ML SOLN Inject 0.4 mLs (40 mg total) into the skin every 12 (twelve) hours. Qty: 11.2 mL, Refills: 0   Comments: Use with Coumadin until INR 2-3  INR to be monitored and medication adjust by facility    warfarin (COUMADIN) 5 MG tablet Take 2-2.5 tablets (10-12.5 mg total) by mouth daily before breakfast. 12.5 mg on Tuesday and Friday. All other days 10 mg.   Comments: INR to be monitored and medication adjusted by facility to maintain INR 2-3.      CONTINUE these medications which have NOT  CHANGED   Details  albuterol (PROAIR HFA) 108 (90 BASE) MCG/ACT inhaler Inhale 2 puffs into the lungs every 4 (four) hours as needed. For shortness of breath    aspirin (ECOTRIN LOW STRENGTH) 81 MG EC tablet Take 81 mg by mouth daily before breakfast.     Aurora Lancet Super Thin 30G MISC Use 1 stick as directed three times a day     B-[name] ULTRAFINE III SHORT PEN 31G X 8 MM MISC     DULoxetine (CYMBALTA) 60 MG capsule Take 60 mg by mouth daily as needed. Depression     glipiZIDE (GLUCOTROL) 10 MG tablet Take 1 tablet (10 mg total) by mouth 2 (two) times daily before a meal. Qty: 180 tablet, Refills: 3   Associated Diagnoses: Type II or unspecified type diabetes mellitus without mention of complication, not stated as uncontrolled    glucose blood (ONE TOUCH ULTRA TEST) test strip 1 each by Other route. 1 strip three times a day     hydrochlorothiazide 25 MG tablet Take 25 mg by mouth daily before breakfast.     insulin glargine (LANTUS SOLOSTAR) 100 UNIT/ML injection Inject 60 Units into the skin at bedtime. Qty: 45 mL, Refills: 3    lisinopril (PRINIVIL,ZESTRIL) 5 MG tablet Take 5 mg by mouth daily before breakfast.     metFORMIN (GLUCOPHAGE) 1000 MG tablet Take 1 tablet (1,000 mg total) by mouth 2 (two) times daily with a meal. Qty: 180 tablet, Refills: 3    simvastatin (ZOCOR) 80 MG tablet every evening.     ZETIA 10 MG tablet TAKE 1 TABLET BY MOUTH  ONCE A DAY Qty: 90 tablet, Refills: 3      STOP taking these medications     traMADol (ULTRAM) 50 MG tablet Comments:  Reason for Stopping:          Signed: Anastasio Auerbach. Cassadie Pankonin   PAC  01/13/2012, 12:48 PM

## 2012-01-13 NOTE — Progress Notes (Signed)
Worker's Comp has approved ST rehab at [facility] . Pt aware and pleased with plan. CSW will assist with d/c planning to SNF today.

## 2012-01-13 NOTE — Progress Notes (Signed)
Pt stable and ready for d/c.  Pt d/c to snf via ambulance at this time.

## 2012-01-13 NOTE — Progress Notes (Signed)
Physical Therapy Treatment Patient Details Name: [name] MRN: [id number] DOB: [birth year] Today's Date: 01/13/2012  L TKR POD #3 am session 10:15 - 10:45 1 ta  1 gt  PT Assessment/Plan  PT - Assessment/Plan Comments on Treatment Session: Pt c/o increased pain that woke him last night.  Despite being pre medicated pt still c/o 9/10 knee pain.  Reported to Dr Charlann Boxer in the hallway. PT Plan: Discharge plan remains appropriate Follow Up Recommendations: Skilled nursing facility Equipment Recommended: Defer to next venue PT Goals  Acute Rehab PT Goals PT Goal Formulation: With patient Pt will go Supine/Side to Sit: with supervision PT Goal: Supine/Side to Sit - Progress: Progressing toward goal Pt will go Sit to Supine/Side: with supervision PT Goal: Sit to Supine/Side - Progress: Progressing toward goal Pt will go Sit to Stand: with supervision PT Goal: Sit to Stand - Progress: Progressing toward goal Pt will Ambulate: 51 - 150 feet;with supervision;with rolling walker PT Goal: Ambulate - Progress: Progressing toward goal Pt will Go Up / Down Stairs: 3-5 stairs;with min assist;with crutches PT Goal: Up/Down Stairs - Progress: Not met Pt will Perform Home Exercise Program: with supervision, verbal cues required/provided PT Goal: Perform Home Exercise Program - Progress: Progressing toward goal  PT Treatment Precautions/Restrictions  Precautions Precautions: Knee Precaution Comments: Pt instructed on KI use and when to D/C afetr 10 active SLR Required Braces or Orthoses: Yes Knee Immobilizer: Discontinue once straight leg raise with < 10 degree lag Restrictions Weight Bearing Restrictions: No LLE Weight Bearing: Weight bearing as tolerated Mobility (including Balance) Bed Mobility Bed Mobility: Yes Supine to Sit: 3: Mod assist Supine to Sit Details (indicate cue type and reason): max assist to support L LE off the bed Transfers Transfers: Yes Sit to Stand: 4: Min  assist;From bed;From elevated surface Sit to Stand Details (indicate cue type and reason): elevated surface and momentum technique  Ambulation/Gait Ambulation/Gait: Yes Ambulation/Gait Assistance: 4: Min assist Ambulation/Gait Assistance Details (indicate cue type and reason): increased time  and 25% VC's on proper wlaker to self distance and increase posture.  Decreased amb distance this am 2nd increased pain. Ambulation Distance (Feet): 70 Feet Assistive device: Rolling walker Gait Pattern: Step-to pattern;Decreased stance time - left;Trunk flexed Stairs: No Wheelchair Mobility Wheelchair Mobility: No    Exercise    End of Session PT - End of Session Equipment Utilized During Treatment: Gait belt Activity Tolerance: Patient limited by pain Patient left: in chair;with call bell in reach General Behavior During Session: [facility] for tasks performed Cognition: [facility] for tasks performed  Felecia Shelling  PTA [facility]  Acute  Rehab Pager     [phone]

## 2012-01-13 NOTE — Progress Notes (Signed)
Subjective: 3 Days Post-Op Procedure(s) (LRB): TOTAL KNEE ARTHROPLASTY (Left)   Patient reports pain as moderate. Some pain throughout the night. Otherwise no events.  Objective:   VITALS:   Filed Vitals:   01/13/12 0635  BP: 120/77  Pulse: 77  Temp: 98.3 F (36.8 C)  Resp: 16    Neurovascular intact Dorsiflexion/Plantar flexion intact Incision: dressing C/D/I No cellulitis present Compartment soft  LABS  Basename 01/12/12 0441 01/11/12 0329  HGB 9.9* 11.7*  HCT 31.8* 36.3*  WBC 9.2 13.6*  PLT 180 208     Basename 01/12/12 0441 01/11/12 0329  NA 138 136  K 4.4 4.7  BUN 27* 21  CREATININE 1.26 1.04  GLUCOSE 148* 296*     Assessment/Plan: 3 Days Post-Op Procedure(s) (LRB): TOTAL KNEE ARTHROPLASTY (Left)   Add Toradol while he is in the hospital Up with therapy Discharge to SNF when ready   Anastasio Auerbach. Dyer Klug   PAC  01/13/2012, 7:43 AM

## 2012-01-13 NOTE — Progress Notes (Signed)
CSW continues to assist with d/c planning to SNF . Spoke with CM, [place] , from worker's comp.,this am. She has approved ST SNF placement and is negotiating a rate with [facility]. CSW expects to hear back from Ms. Easter and SNF this afternoon. Pt has been updated.

## 2012-01-14 NOTE — Progress Notes (Signed)
  CARE MANAGEMENT NOTE 01/14/2012  Patient:  [name], [name]   Account Number:  [id number]  Date Initiated:  01/13/2012  Documentation initiated by:  Colleen Can  Subjective/Objective Assessment:   DX OSTEOARTHRITIS LEFT KNEE; TOTAL KNEE REPLACEMNT     Action/Plan:   PLAN FOR ST SNF; REFERRAL TO CSW on 01/11/2012   Anticipated DC Date:  01/13/2012   Anticipated DC Plan:  SKILLED NURSING FACILITY  In-house referral  Clinical Social Worker      DC Planning Services  NA      [facility] Choice  NA   Choice offered to / List presented to:  NA   DME arranged  NA      DME agency  NA     HH arranged  NA      HH agency  NA   Status of service:  Completed, signed off Medicare Important Message given?  NO (If response is "NO", the following Medicare IM given date fields will be blank) Date Medicare IM given:   Date Additional Medicare IM given:    Discharge Disposition:  SKILLED NURSING FACILITY

## 2012-01-15 ENCOUNTER — Other Ambulatory Visit: Payer: Self-pay | Admitting: Internal Medicine

## 2012-02-08 ENCOUNTER — Ambulatory Visit: Payer: Self-pay | Admitting: Cardiology

## 2012-02-08 DIAGNOSIS — I2699 Other pulmonary embolism without acute cor pulmonale: Secondary | ICD-10-CM

## 2012-02-14 ENCOUNTER — Ambulatory Visit: Payer: Self-pay | Admitting: Cardiology

## 2012-02-14 DIAGNOSIS — I2699 Other pulmonary embolism without acute cor pulmonale: Secondary | ICD-10-CM

## 2012-02-14 LAB — POCT INR: INR: 1.6

## 2012-02-17 ENCOUNTER — Ambulatory Visit: Payer: BC Managed Care – PPO | Admitting: Internal Medicine

## 2012-02-21 ENCOUNTER — Ambulatory Visit: Payer: Self-pay | Admitting: Internal Medicine

## 2012-02-21 DIAGNOSIS — I2699 Other pulmonary embolism without acute cor pulmonale: Secondary | ICD-10-CM

## 2012-02-29 ENCOUNTER — Ambulatory Visit (INDEPENDENT_AMBULATORY_CARE_PROVIDER_SITE_OTHER): Payer: Self-pay | Admitting: Cardiology

## 2012-02-29 DIAGNOSIS — I2699 Other pulmonary embolism without acute cor pulmonale: Secondary | ICD-10-CM

## 2012-03-14 ENCOUNTER — Ambulatory Visit (INDEPENDENT_AMBULATORY_CARE_PROVIDER_SITE_OTHER): Payer: BC Managed Care – PPO | Admitting: *Deleted

## 2012-03-14 DIAGNOSIS — I2699 Other pulmonary embolism without acute cor pulmonale: Secondary | ICD-10-CM

## 2012-03-14 LAB — POCT INR: INR: 2.7

## 2012-03-28 ENCOUNTER — Ambulatory Visit (INDEPENDENT_AMBULATORY_CARE_PROVIDER_SITE_OTHER): Payer: BC Managed Care – PPO | Admitting: *Deleted

## 2012-03-28 DIAGNOSIS — I2699 Other pulmonary embolism without acute cor pulmonale: Secondary | ICD-10-CM

## 2012-03-28 LAB — POCT INR: INR: 2.8

## 2012-03-30 ENCOUNTER — Other Ambulatory Visit: Payer: Self-pay | Admitting: Internal Medicine

## 2012-04-18 ENCOUNTER — Ambulatory Visit (INDEPENDENT_AMBULATORY_CARE_PROVIDER_SITE_OTHER): Payer: BC Managed Care – PPO | Admitting: Pharmacist

## 2012-04-18 DIAGNOSIS — I2699 Other pulmonary embolism without acute cor pulmonale: Secondary | ICD-10-CM

## 2012-04-28 ENCOUNTER — Other Ambulatory Visit: Payer: Self-pay

## 2012-05-09 ENCOUNTER — Ambulatory Visit (INDEPENDENT_AMBULATORY_CARE_PROVIDER_SITE_OTHER): Payer: BC Managed Care – PPO

## 2012-05-09 DIAGNOSIS — I2699 Other pulmonary embolism without acute cor pulmonale: Secondary | ICD-10-CM

## 2012-05-09 LAB — POCT INR: INR: 2.7

## 2012-05-15 ENCOUNTER — Other Ambulatory Visit: Payer: Self-pay | Admitting: Internal Medicine

## 2012-05-15 NOTE — Telephone Encounter (Signed)
Cymbalta 60 mg request [last refill 11.08.2011 #90x3]/SLS Please advise.

## 2012-05-15 NOTE — Telephone Encounter (Signed)
Done hardcopy to sharon 

## 2012-05-19 ENCOUNTER — Ambulatory Visit (INDEPENDENT_AMBULATORY_CARE_PROVIDER_SITE_OTHER): Payer: BC Managed Care – PPO | Admitting: Internal Medicine

## 2012-05-19 ENCOUNTER — Encounter: Payer: Self-pay | Admitting: Internal Medicine

## 2012-05-19 VITALS — BP 110/90 | HR 70 | Temp 98.6°F | Ht 74.0 in | Wt 367.4 lb

## 2012-05-19 DIAGNOSIS — E119 Type 2 diabetes mellitus without complications: Secondary | ICD-10-CM

## 2012-05-19 DIAGNOSIS — I10 Essential (primary) hypertension: Secondary | ICD-10-CM

## 2012-05-19 DIAGNOSIS — R42 Dizziness and giddiness: Secondary | ICD-10-CM

## 2012-05-19 DIAGNOSIS — J069 Acute upper respiratory infection, unspecified: Secondary | ICD-10-CM | POA: Insufficient documentation

## 2012-05-19 MED ORDER — AMOXICILLIN 500 MG PO CAPS: ORAL_CAPSULE | ORAL | Status: DC

## 2012-05-19 MED ORDER — MECLIZINE HCL 12.5 MG PO TABS
12.5000 mg | ORAL_TABLET | Freq: Three times a day (TID) | ORAL | Status: AC | PRN
Start: 2012-05-19 — End: 2012-05-29

## 2012-05-19 NOTE — Progress Notes (Signed)
Subjective:    Patient ID: [name], male    DOB: 1956-08-07, 56 y.o.   MRN: [id number]  HPI  Here to f/u;  C/o dizziness with standing, mild for one wk, worse to stand and worries he might pass out, but no syncope,  Occurred mult times most days in the past wk;  No falls,  Pt denies fever, wt loss, night sweats, loss of appetite, or other constitutional symptoms.  Pt denies chest pain, increased sob or doe, wheezing, orthopnea, PND, increased LE swelling, palpitations.  Pt denies new neurological symptoms such as new headache, or facial or extremity weakness or numbness  Pt denies polydipsia, polyuria.  Has had some ear fullness after showers, and makes him dizzy as well.   Pt denies polydipsia, polyuria, or low sugar symptoms such as weakness or confusion improved with po intake.  Pt states overall good compliance with meds, trying to follow lower cholesterol, diabetic diet, wt overall stable but little exercise however.     Past Medical History  Diagnosis Date  . Diabetes mellitus     type II  . Hyperlipidemia   . Hypertension   . DVT, lower extremity     right  . Embolus 2005    pulmonary  . Depression   . Obesity   . BACK PAIN   . COLONIC POLYPS, HX OF   . DEGENERATIVE JOINT DISEASE, KNEES, BILATERAL   . DEPRESSION   . DIVERTICULOSIS, COLON   . HYPOGONADISM, MALE   . HYPOTENSION, ORTHOSTATIC   . OBESITY, MORBID   . Long term current use of anticoagulant   . Obstructive sleep apnea     uses cpap setting 8 to 10  . Kidney stones   . Renal insufficiency   . RENAL INSUFFICIENCY    Past Surgical History  Procedure Date  . Knee surgury 2000 & 2003     cartilage damage  . Ankle surgury 2001 bilateral    with bone spurs and torn tendon  . Edg 2008    chronic Duodenitis  . Tonsillectomy and adenoidectomy age 83 or 25  . Total knee arthroplasty 01/10/2012    Procedure: TOTAL KNEE ARTHROPLASTY;  Surgeon: Shelda Pal, MD;  Location: [facility];  Service: Orthopedics;   Laterality: Left;    reports that he quit smoking about 33 years ago. He does not have any smokeless tobacco history on file. He reports that he drinks alcohol. He reports that he does not use illicit drugs. family history includes Diabetes in his brother and sister; Hypertension in his mother; and Prostate cancer in his father. No Known Allergies Current Outpatient Prescriptions on File Prior to Visit  Medication Sig Dispense Refill  . albuterol (PROAIR HFA) 108 (90 BASE) MCG/ACT inhaler Inhale 2 puffs into the lungs every 4 (four) hours as needed. For shortness of breath      . aspirin (ECOTRIN LOW STRENGTH) 81 MG EC tablet Take 81 mg by mouth daily before breakfast.       . Aurora Lancet Super Thin 30G MISC Use 1 stick as directed three times a day       . B-D ULTRAFINE III SHORT PEN 31G X 8 MM MISC       . CYMBALTA 60 MG capsule TAKE ONE CAPSULE BY MOUTH EVERY DAY  90 capsule  1  . glipiZIDE (GLUCOTROL) 10 MG tablet Take 1 tablet (10 mg total) by mouth 2 (two) times daily before a meal.  180 tablet  3  .  glucose blood (ONE TOUCH ULTRA TEST) test strip 1 each by Other route. 1 strip three times a day       . hydrochlorothiazide (HYDRODIURIL) 25 MG tablet TAKE 1 TABLET BY MOUTH EVERY DAY  90 tablet  3  . insulin glargine (LANTUS SOLOSTAR) 100 UNIT/ML injection Inject 60 Units into the skin at bedtime.  45 mL  3  . lisinopril (PRINIVIL,ZESTRIL) 5 MG tablet 1 BY MOUTH ONCE DAILY  90 tablet  3  . metFORMIN (GLUCOPHAGE) 1000 MG tablet Take 1 tablet (1,000 mg total) by mouth 2 (two) times daily with a meal.  180 tablet  3  . simvastatin (ZOCOR) 80 MG tablet every evening.       . warfarin (COUMADIN) 5 MG tablet Take 2-2.5 tablets (10-12.5 mg total) by mouth daily before breakfast. 12.5 mg on Tuesday and Friday. All other days 10 mg.      . ZETIA 10 MG tablet TAKE 1 TABLET BY MOUTH ONCE A DAY  90 tablet  3  . acetaminophen (TYLENOL) 325 MG tablet Take 2 tablets (650 mg total) by mouth every 6 (six)  hours as needed (or Fever >/= 101).      . ferrous sulfate 325 (65 FE) MG tablet Take 1 tablet (325 mg total) by mouth 3 (three) times daily after meals.       Review of Systems Review of Systems  Constitutional: Negative for diaphoresis and unexpected weight change.  HENT: Negative for  tinnitus.   Eyes: Negative for photophobia and visual disturbance.  Respiratory: Negative for choking and stridor.   Gastrointestinal: Negative for vomiting and blood in stool.  Genitourinary: Negative for hematuria and decreased urine volume.  Musculoskeletal: Negative for gait problem.  Skin: Negative for color change and wound.  Psychiatric/Behavioral: Negative for decreased concentration. The patient is not hyperactive.      Objective:   Physical Exam BP 110/90  Pulse 70  Temp 98.6 F (37 C) (Oral)  Ht 6\' 2"  (1.88 m)  Wt 367 lb 6 oz (166.64 kg)  BMI 47.17 kg/m2  SpO2 90% Physical Exam  VS noted, mild ill appearing Constitutional: Pt appears well-developed and well-nourished.  HENT: Head: Normocephalic.  Right Ear: External ear normal.  Left Ear: External ear normal.  Left tm's mod erythema with effusion.  Sinus nontender.  Pharynx mild erythema Eyes: Conjunctivae and EOM are normal. Pupils are equal, round, and reactive to light.  Neck: Normal range of motion. Neck supple.  Cardiovascular: Normal rate and regular rhythm.   Pulmonary/Chest: Effort normal and breath sounds normal.  Abd:  Soft, NT, non-distended, + BS Neurological: Pt is alert. No cranial nerve deficit. Motor/dtr/gait intact  Skin: Skin is warm. No erythema.  Psychiatric: Pt behavior is normal. Thought content normal.     Assessment & Plan:

## 2012-05-19 NOTE — Assessment & Plan Note (Addendum)
ECG reviewed as per emr, c/w vertigo, likely related to left middle ear etiology; for meclizine prn,  to f/u any worsening symptoms or concerns

## 2012-05-19 NOTE — Patient Instructions (Addendum)
Take all new medications as prescribed Continue all other medications as before You can also take  Mucinex (or it's generic off brand) for congestion  

## 2012-05-20 ENCOUNTER — Encounter: Payer: Self-pay | Admitting: Internal Medicine

## 2012-05-20 NOTE — Assessment & Plan Note (Signed)
stable overall by hx and exam, most recent data reviewed with pt, and pt to continue medical treatment as before BP Readings from Last 3 Encounters:  05/19/12 110/90  01/13/12 128/70  01/13/12 128/70

## 2012-05-20 NOTE — Assessment & Plan Note (Signed)
Mild to mod, for antibx course,  to f/u any worsening symptoms or concerns 

## 2012-05-20 NOTE — Assessment & Plan Note (Signed)
stable overall by hx and exam, most recent data reviewed with pt, and pt to continue medical treatment as before Lab Results  Component Value Date   HGBA1C 8.4* 12/16/2011

## 2012-06-01 ENCOUNTER — Other Ambulatory Visit: Payer: Self-pay | Admitting: Internal Medicine

## 2012-06-14 ENCOUNTER — Other Ambulatory Visit (INDEPENDENT_AMBULATORY_CARE_PROVIDER_SITE_OTHER): Payer: BC Managed Care – PPO

## 2012-06-14 ENCOUNTER — Ambulatory Visit (INDEPENDENT_AMBULATORY_CARE_PROVIDER_SITE_OTHER): Payer: BC Managed Care – PPO | Admitting: Internal Medicine

## 2012-06-14 ENCOUNTER — Encounter: Payer: Self-pay | Admitting: Internal Medicine

## 2012-06-14 VITALS — BP 130/88 | HR 80 | Temp 99.2°F | Ht 74.0 in | Wt 366.5 lb

## 2012-06-14 DIAGNOSIS — M5417 Radiculopathy, lumbosacral region: Secondary | ICD-10-CM

## 2012-06-14 DIAGNOSIS — M5416 Radiculopathy, lumbar region: Secondary | ICD-10-CM

## 2012-06-14 DIAGNOSIS — E119 Type 2 diabetes mellitus without complications: Secondary | ICD-10-CM

## 2012-06-14 DIAGNOSIS — Z0000 Encounter for general adult medical examination without abnormal findings: Secondary | ICD-10-CM

## 2012-06-14 DIAGNOSIS — IMO0002 Reserved for concepts with insufficient information to code with codable children: Secondary | ICD-10-CM

## 2012-06-14 DIAGNOSIS — I10 Essential (primary) hypertension: Secondary | ICD-10-CM

## 2012-06-14 HISTORY — DX: Radiculopathy, lumbosacral region: M54.17

## 2012-06-14 LAB — URINALYSIS, ROUTINE W REFLEX MICROSCOPIC
Bilirubin Urine: NEGATIVE
Ketones, ur: NEGATIVE
Total Protein, Urine: NEGATIVE
pH: 6 (ref 5.0–8.0)

## 2012-06-14 LAB — CBC WITH DIFFERENTIAL/PLATELET
Basophils Relative: 0.2 % (ref 0.0–3.0)
Eosinophils Relative: 2.2 % (ref 0.0–5.0)
HCT: 42.5 % (ref 39.0–52.0)
Hemoglobin: 13.2 g/dL (ref 13.0–17.0)
Lymphs Abs: 2.2 10*3/uL (ref 0.7–4.0)
MCV: 69.7 fl — ABNORMAL LOW (ref 78.0–100.0)
Monocytes Absolute: 0.7 10*3/uL (ref 0.1–1.0)
Neutro Abs: 6.9 10*3/uL (ref 1.4–7.7)
Platelets: 253 10*3/uL (ref 150.0–400.0)
RBC: 6.1 Mil/uL — ABNORMAL HIGH (ref 4.22–5.81)
WBC: 10.1 10*3/uL (ref 4.5–10.5)

## 2012-06-14 LAB — PSA: PSA: 0.88 ng/mL (ref 0.10–4.00)

## 2012-06-14 LAB — HEPATIC FUNCTION PANEL
AST: 22 U/L (ref 0–37)
Total Bilirubin: 0.5 mg/dL (ref 0.3–1.2)

## 2012-06-14 LAB — BASIC METABOLIC PANEL
BUN: 21 mg/dL (ref 6–23)
GFR: 91.13 mL/min (ref >60.00)
Potassium: 4.8 mEq/L (ref 3.5–5.1)

## 2012-06-14 LAB — LIPID PANEL
Cholesterol: 155 mg/dL (ref 0–200)
LDL Cholesterol: 85 mg/dL (ref 0–99)
VLDL: 27.6 mg/dL (ref 0.0–40.0)

## 2012-06-14 LAB — HEMOGLOBIN A1C: Hgb A1c MFr Bld: 7.8 % — ABNORMAL HIGH (ref 4.6–6.5)

## 2012-06-14 NOTE — Assessment & Plan Note (Signed)
stable overall by hx and exam, most recent data reviewed with pt, and pt to continue medical treatment as before BP Readings from Last 3 Encounters:  06/14/12 130/88  05/19/12 110/90  01/13/12 128/70

## 2012-06-14 NOTE — Assessment & Plan Note (Addendum)
New onset worsening , mod to severe pain with LLE weakness , for pain control, MRI LS spine, and ortho referral  [facility] [facility] per pt preference

## 2012-06-14 NOTE — Patient Instructions (Addendum)
Continue all other medications as before You are otherwise up to date with prevention Please go to LAB in the Basement for the blood and/or urine tests to be done today You will be contacted by phone if any changes need to be made immediately.  Otherwise, you will receive a letter about your results with an explanation. You will be contacted regarding the referral for: MRI for the lower back, and [facility] Please return in 6 mo with Lab testing done 3-5 days before

## 2012-06-14 NOTE — Progress Notes (Signed)
Subjective:    Patient ID: [name], male    DOB: 07-30-1956, 56 y.o.   MRN: [id number]  HPI  Here for wellness and f/u;  Overall doing ok;  Pt denies CP, worsening SOB, DOE, wheezing, orthopnea, PND, worsening LE edema, palpitations, dizziness or syncope.  Pt denies neurological change such as new Headache, facial or extremity weakness.  Pt denies polydipsia, polyuria, or low sugar symptoms. Pt states overall good compliance with treatment and medications, good tolerability, and trying to follow lower cholesterol diet.  Pt denies worsening depressive symptoms, suicidal ideation or panic. No fever, wt loss, night sweats, loss of appetite, or other constitutional symptoms.  Pt states good ability with ADL's, low fall risk, home safety reviewed and adequate, no significant changes in hearing or vision, and occasionally active with exercise.  Incidentally with 3 wks subacute worsening Left lower back pain with radiation to the left foot, mod to severe, intermittent, assoc with  Weakness but no numbness;  Pain worse with walking, sitting makes better.  Pt with no bowel or bladder change, fever, wt loss, or falls.  Is also s/p left knee TKA feb 2013, will eventually need right knee replaced, now doing PT and pt thinks possible PT led to acute back situation.   Past Medical History  Diagnosis Date  . Diabetes mellitus     type II  . Hyperlipidemia   . Hypertension   . DVT, lower extremity     right  . Embolus 2005    pulmonary  . Depression   . Obesity   . BACK PAIN   . COLONIC POLYPS, HX OF   . DEGENERATIVE JOINT DISEASE, KNEES, BILATERAL   . DEPRESSION   . DIVERTICULOSIS, COLON   . HYPOGONADISM, MALE   . HYPOTENSION, ORTHOSTATIC   . OBESITY, MORBID   . Long term current use of anticoagulant   . Obstructive sleep apnea     uses cpap setting 8 to 10  . Kidney stones   . Renal insufficiency   . RENAL INSUFFICIENCY    Past Surgical History  Procedure Date  . Knee surgury 2000 & 2003      cartilage damage  . Ankle surgury 2001 bilateral    with bone spurs and torn tendon  . Edg 2008    chronic Duodenitis  . Tonsillectomy and adenoidectomy age 58 or 39  . Total knee arthroplasty 01/10/2012    Procedure: TOTAL KNEE ARTHROPLASTY;  Surgeon: Shelda Pal, MD;  Location: [facility];  Service: Orthopedics;  Laterality: Left;    reports that he quit smoking about 33 years ago. He does not have any smokeless tobacco history on file. He reports that he drinks alcohol. He reports that he does not use illicit drugs. family history includes Diabetes in his brother and sister; Hypertension in his mother; and Prostate cancer in his father. No Known Allergies Current Outpatient Prescriptions on File Prior to Visit  Medication Sig Dispense Refill  . albuterol (PROAIR HFA) 108 (90 BASE) MCG/ACT inhaler Inhale 2 puffs into the lungs every 4 (four) hours as needed. For shortness of breath      . aspirin (ECOTRIN LOW STRENGTH) 81 MG EC tablet Take 81 mg by mouth daily before breakfast.       . Aurora Lancet Super Thin 30G MISC Use 1 stick as directed three times a day       . B-D ULTRAFINE III SHORT PEN 31G X 8 MM MISC       .  CYMBALTA 60 MG capsule TAKE ONE CAPSULE BY MOUTH EVERY DAY  90 capsule  1  . glipiZIDE (GLUCOTROL) 10 MG tablet Take 1 tablet (10 mg total) by mouth 2 (two) times daily before a meal.  180 tablet  3  . glucose blood (ONE TOUCH ULTRA TEST) test strip 1 each by Other route. 1 strip three times a day       . hydrochlorothiazide (HYDRODIURIL) 25 MG tablet TAKE 1 TABLET BY MOUTH EVERY DAY  90 tablet  3  . insulin glargine (LANTUS SOLOSTAR) 100 UNIT/ML injection Inject 60 Units into the skin at bedtime.  45 mL  3  . lisinopril (PRINIVIL,ZESTRIL) 5 MG tablet 1 BY MOUTH ONCE DAILY  90 tablet  3  . metFORMIN (GLUCOPHAGE) 1000 MG tablet TAKE 1 TABLET (1,000 MG TOTAL) BY MOUTH 2 (TWO) TIMES DAILY WITH A MEAL.  180 tablet  3  . simvastatin (ZOCOR) 80 MG tablet every evening.       .  warfarin (COUMADIN) 5 MG tablet Take 2-2.5 tablets (10-12.5 mg total) by mouth daily before breakfast. 12.5 mg on Tuesday and Friday. All other days 10 mg.      . ZETIA 10 MG tablet TAKE 1 TABLET BY MOUTH ONCE A DAY  90 tablet  3  . acetaminophen (TYLENOL) 325 MG tablet Take 2 tablets (650 mg total) by mouth every 6 (six) hours as needed (or Fever >/= 101).      Marland Kitchen amoxicillin (AMOXIL) 500 MG capsule 2 tabs by mouth twice per day  40 capsule  0  . ferrous sulfate 325 (65 FE) MG tablet Take 1 tablet (325 mg total) by mouth 3 (three) times daily after meals.       Review of Systems Review of Systems  Constitutional: Negative for diaphoresis, activity change, appetite change and unexpected weight change.  HENT: Negative for hearing loss, ear pain, facial swelling, mouth sores and neck stiffness.   Eyes: Negative for pain, redness and visual disturbance.  Respiratory: Negative for shortness of breath and wheezing.   Cardiovascular: Negative for chest pain and palpitations.  Gastrointestinal: Negative for diarrhea, blood in stool, abdominal distention and rectal pain.  Genitourinary: Negative for hematuria, flank pain and decreased urine volume.  Musculoskeletal: Negative for myalgias and joint swelling.  Skin: Negative for color change and wound.  Neurological: Negative for syncope and numbness.  Hematological: Negative for adenopathy.  Psychiatric/Behavioral: Negative for hallucinations, self-injury, decreased concentration and agitation.     Objective:   Physical Exam BP 130/88  Pulse 80  Temp 99.2 F (37.3 C) (Oral)  Ht 6\' 2"  (1.88 m)  Wt 366 lb 8 oz (166.243 kg)  BMI 47.06 kg/m2  SpO2 93% Physical Exam  VS noted Constitutional: Pt is oriented to person, place, and time. Appears well-developed and well-nourished.  HENT:  Head: Normocephalic and atraumatic.  Right Ear: External ear normal.  Left Ear: External ear normal.  Nose: Nose normal.  Mouth/Throat: Oropharynx is clear and  moist.  Eyes: Conjunctivae and EOM are normal. Pupils are equal, round, and reactive to light.  Neck: Normal range of motion. Neck supple. No JVD present. No tracheal deviation present.  Cardiovascular: Normal rate, regular rhythm, normal heart sounds and intact distal pulses.   Pulmonary/Chest: Effort normal and breath sounds normal.  Abdominal: Soft. Bowel sounds are normal. There is no tenderness.  Musculoskeletal: Normal range of motion. Exhibits no edema.  Lymphadenopathy:  Has no cervical adenopathy.  Neurological: Pt is alert and oriented to  person, place, and time. Pt has normal reflexes. No cranial nerve deficit. slr equivocal for LLE, motor/sens intact except 4+/5 distal LLE weakness  Skin: Skin is warm and dry. No rash noted.  Psychiatric:  Has  normal mood and affect. Behavior is normal.     Assessment & Plan:

## 2012-06-14 NOTE — Assessment & Plan Note (Signed)

## 2012-06-14 NOTE — Assessment & Plan Note (Signed)
stable overall by hx and exam, most recent data reviewed with pt, and pt to continue medical treatment as before Lab Results  Component Value Date   HGBA1C 7.8* 06/14/2012

## 2012-06-21 ENCOUNTER — Ambulatory Visit
Admission: RE | Admit: 2012-06-21 | Discharge: 2012-06-21 | Disposition: A | Discharge status: Home / Self Care | Payer: BC Managed Care – PPO | Source: Ambulatory Visit | Attending: Internal Medicine | Admitting: Internal Medicine

## 2012-06-21 DIAGNOSIS — M5416 Radiculopathy, lumbar region: Secondary | ICD-10-CM

## 2012-06-22 ENCOUNTER — Encounter: Payer: Self-pay | Admitting: Internal Medicine

## 2012-06-22 ENCOUNTER — Other Ambulatory Visit: Payer: Self-pay

## 2012-06-23 ENCOUNTER — Ambulatory Visit (INDEPENDENT_AMBULATORY_CARE_PROVIDER_SITE_OTHER): Payer: BC Managed Care – PPO | Admitting: *Deleted

## 2012-06-23 DIAGNOSIS — I2699 Other pulmonary embolism without acute cor pulmonale: Secondary | ICD-10-CM

## 2012-07-03 ENCOUNTER — Telehealth: Payer: Self-pay

## 2012-07-03 NOTE — Telephone Encounter (Signed)
Noted and agree. 

## 2012-07-03 NOTE — Telephone Encounter (Signed)
Pt called stating he saw Dr Leslee Home on 06/30/12, he instructed pt to hold Coumadin until he sees MD back in 10 days on 07/10/12.  Will make Dr Jonny Ruiz aware pt is holding Coumadin at present per Dr Applington's instructions.  Pt has f/u appt on 07/21/12, advised pt if he does not restart Coumadin on 07/10/12 to call back to r/s f/u appt.

## 2012-07-30 ENCOUNTER — Other Ambulatory Visit: Payer: Self-pay | Admitting: Internal Medicine

## 2012-08-09 ENCOUNTER — Encounter: Payer: Self-pay | Admitting: General Practice

## 2012-08-11 ENCOUNTER — Other Ambulatory Visit: Payer: Self-pay | Admitting: Internal Medicine

## 2012-08-17 ENCOUNTER — Other Ambulatory Visit: Payer: Self-pay | Admitting: Internal Medicine

## 2012-09-01 ENCOUNTER — Encounter: Payer: Self-pay | Admitting: Internal Medicine

## 2012-09-04 ENCOUNTER — Other Ambulatory Visit: Payer: Self-pay | Admitting: General Practice

## 2012-09-04 ENCOUNTER — Ambulatory Visit (INDEPENDENT_AMBULATORY_CARE_PROVIDER_SITE_OTHER): Payer: BC Managed Care – PPO | Admitting: General Practice

## 2012-09-04 DIAGNOSIS — I2699 Other pulmonary embolism without acute cor pulmonale: Secondary | ICD-10-CM

## 2012-09-04 LAB — POCT INR: INR: 3.2

## 2012-09-04 MED ORDER — WARFARIN SODIUM 1 MG PO TABS: 1.0000 mg | ORAL_TABLET | ORAL | Status: DC

## 2012-09-04 MED ORDER — WARFARIN SODIUM 5 MG PO TABS: 5.0000 mg | ORAL_TABLET | Freq: Every day | ORAL | Status: DC

## 2012-09-06 ENCOUNTER — Other Ambulatory Visit: Payer: Self-pay | Admitting: Cardiology

## 2012-09-06 MED ORDER — WARFARIN SODIUM 5 MG PO TABS: ORAL_TABLET | ORAL | Status: DC

## 2012-09-06 MED ORDER — WARFARIN SODIUM 1 MG PO TABS: ORAL_TABLET | ORAL | Status: DC

## 2012-09-18 ENCOUNTER — Other Ambulatory Visit: Payer: Self-pay | Admitting: Orthopedic Surgery

## 2012-09-20 ENCOUNTER — Encounter (HOSPITAL_COMMUNITY): Payer: Self-pay | Admitting: Pharmacy Technician

## 2012-09-26 ENCOUNTER — Encounter (HOSPITAL_COMMUNITY)
Admission: RE | Admit: 2012-09-26 | Discharge: 2012-09-26 | Disposition: A | Discharge status: Home / Self Care | Payer: BC Managed Care – PPO | Source: Ambulatory Visit | Attending: Orthopedic Surgery | Admitting: Orthopedic Surgery

## 2012-09-26 ENCOUNTER — Encounter (HOSPITAL_COMMUNITY): Payer: Self-pay

## 2012-09-26 LAB — PROTIME-INR
INR: 0.99 (ref 0.00–1.49)
Prothrombin Time: 13 seconds (ref 11.6–15.2)

## 2012-09-26 LAB — BASIC METABOLIC PANEL
Calcium: 9.4 mg/dL (ref 8.4–10.5)
GFR calc Af Amer: >90 mL/min (ref >90)
GFR calc non Af Amer: >90 mL/min (ref >90)
Sodium: 139 mEq/L (ref 135–145)

## 2012-09-26 LAB — CBC
MCH: 21 pg — ABNORMAL LOW (ref 26.0–34.0)
MCHC: 30.8 g/dL (ref 30.0–36.0)
Platelets: 237 10*3/uL (ref 150–400)
RBC: 5.96 MIL/uL — ABNORMAL HIGH (ref 4.22–5.81)
RDW: 15.7 % — ABNORMAL HIGH (ref 11.5–15.5)

## 2012-09-26 LAB — SURGICAL PCR SCREEN: MRSA, PCR: POSITIVE — AB

## 2012-09-26 NOTE — Progress Notes (Signed)
DR. Simonne Come,           Pt is Jehovah's witness and signed refusal for blood.

## 2012-09-26 NOTE — Pre-Procedure Instructions (Signed)
CXR REPORT 01/04/12 AND EKG REPORT 05/19/12 ARE IN EPIC. CBC, BMET, PT, PTT WERE DONE PREOP TODAY AT [facility] AS PER ANESTHESIOLOGIST'S GUIDELINES.

## 2012-09-26 NOTE — Pre-Procedure Instructions (Signed)
Note faxed to Dr. Leim Fabry office to let him know pt is [name] witness and signed refusal for blood.  Refusal for blood was faxed to [facility] blood bank department and noted under drug allergies and FYI in epic.

## 2012-09-26 NOTE — Patient Instructions (Addendum)
YOUR SURGERY IS SCHEDULED AT [facility]  ON:   Thursday  10/24  AT 1:15 PM  REPORT TO [facility] AT:  11:15 AM      PHONE # FOR SHORT STAY IS [phone]  DO NOT EAT  ANYTHING AFTER MIDNIGHT THE NIGHT BEFORE YOUR SURGERY.  NO FOOD, NO CHEWING GUM, NO MINTS, NO CANDIES, NO CHEWING TOBACCO.  YOU MAY HAVE CLEAR LIQUIDS TO DRINK FROM MIDNIGHT THE NIGHT BEFORE YOUR SURGERY UNTIL 7:00 AM DAY OF SURGERY--LIKE WATER, APPLE JUICE, TEA.     NOTHING TO DRINK AFTER 7:00 AM DAY OF YOUR SURGERY !!!!!!!!!!!  PLEASE TAKE THE FOLLOWING MEDICATIONS THE AM OF YOUR SURGERY WITH A FEW SIPS OF WATER:  CYMBALTA, HYDROCODONE/ACETAMINOPHEN.  USE YOUR ALBUTEROL AND BRING YOUR ALBUTEROL TO THE HOSPITAL TO TAKE TO SURGERY.   IF YOU USE INHALERS--USE YOUR INHALERS THE AM OF YOUR SURGERY AND BRING INHALERS TO THE HOSPITAL -TAKE TO SURGERY.    IF YOU ARE DIABETIC:  DO NOT TAKE ANY DIABETIC MEDICATIONS THE AM OF YOUR SURGERY.  IF YOU TAKE INSULIN IN THE EVENINGS--PLEASE ONLY TAKE 1/2 NORMAL EVENING DOSE THE NIGHT BEFORE YOUR SURGERY.  NO INSULIN THE AM OF YOUR SURGERY.  IF YOU HAVE SLEEP APNEA AND USE CPAP OR BIPAP--PLEASE BRING THE MASK AND THE TUBING.  DO NOT BRING YOUR MACHINE.  DO NOT BRING VALUABLES, MONEY, CREDIT CARDS.  DO NOT WEAR JEWELRY, MAKE-UP, NAIL POLISH AND NO METAL PINS OR CLIPS IN YOUR HAIR. CONTACT LENS, DENTURES / PARTIALS, GLASSES SHOULD NOT BE WORN TO SURGERY AND IN MOST CASES-HEARING AIDS WILL NEED TO BE REMOVED.  BRING YOUR GLASSES CASE, ANY EQUIPMENT NEEDED FOR YOUR CONTACT LENS. FOR PATIENTS ADMITTED TO THE HOSPITAL--CHECK OUT TIME THE DAY OF DISCHARGE IS 11:00 AM.  ALL INPATIENT ROOMS ARE PRIVATE - WITH BATHROOM, TELEPHONE, TELEVISION AND WIFI INTERNET.  IF YOU ARE BEING DISCHARGED THE SAME DAY OF YOUR SURGERY--YOU CAN NOT DRIVE YOURSELF HOME--AND SHOULD NOT GO HOME ALONE BY TAXI OR BUS.  NO DRIVING OR OPERATING MACHINERY FOR 24 HOURS FOLLOWING ANESTHESIA / PAIN  MEDICATIONS.  PLEASE MAKE ARRANGEMENTS FOR SOMEONE TO BE WITH YOU AT HOME THE FIRST 24 HOURS AFTER SURGERY. RESPONSIBLE DRIVER'S NAME___________________________                                               PHONE #   _______________________                                  PLEASE READ OVER ANY  FACT SHEETS THAT YOU WERE GIVEN: MRSA INFORMATION

## 2012-09-27 ENCOUNTER — Telehealth: Payer: Self-pay | Admitting: General Practice

## 2012-09-27 NOTE — Telephone Encounter (Signed)
Patient called in and stated that he has back surgery scheduled tomorrow (10-24) and Dr. Leslee Home stopped coumadin 10-19.  Patient stated he had knee surgery in February of 2013 he was given Lovenox but that it was not prescribed this time. After speaking with Dr. Jonny Ruiz and Weston Brass I told patient that it is to late for the Lovenox and to direct any questions to Dr. Leslee Home.

## 2012-09-27 NOTE — Progress Notes (Signed)
RN called pt about change in surgery time for 09/28/12. He is to arrive 1030 AM for 1230 surgery time. NPO after midnight. He verbalizes understanding.

## 2012-09-28 ENCOUNTER — Encounter (HOSPITAL_COMMUNITY)
Admission: RE | Disposition: A | Discharge status: Home / Self Care | Payer: Self-pay | Source: Ambulatory Visit | Attending: Orthopedic Surgery

## 2012-09-28 ENCOUNTER — Encounter (HOSPITAL_COMMUNITY): Payer: Self-pay | Admitting: *Deleted

## 2012-09-28 ENCOUNTER — Encounter (HOSPITAL_COMMUNITY): Payer: Self-pay | Admitting: Anesthesiology

## 2012-09-28 ENCOUNTER — Ambulatory Visit (HOSPITAL_COMMUNITY): Payer: BC Managed Care – PPO

## 2012-09-28 ENCOUNTER — Ambulatory Visit (HOSPITAL_COMMUNITY): Payer: BC Managed Care – PPO | Admitting: Anesthesiology

## 2012-09-28 ENCOUNTER — Observation Stay (HOSPITAL_COMMUNITY)
Admission: RE | Admit: 2012-09-28 | Discharge: 2012-10-01 | Disposition: A | Discharge status: Home / Self Care | Payer: BC Managed Care – PPO | Source: Ambulatory Visit | Attending: Orthopedic Surgery | Admitting: Orthopedic Surgery

## 2012-09-28 DIAGNOSIS — E785 Hyperlipidemia, unspecified: Secondary | ICD-10-CM | POA: Insufficient documentation

## 2012-09-28 DIAGNOSIS — Z86718 Personal history of other venous thrombosis and embolism: Secondary | ICD-10-CM | POA: Insufficient documentation

## 2012-09-28 DIAGNOSIS — M5126 Other intervertebral disc displacement, lumbar region: Principal | ICD-10-CM | POA: Insufficient documentation

## 2012-09-28 DIAGNOSIS — Z79899 Other long term (current) drug therapy: Secondary | ICD-10-CM | POA: Insufficient documentation

## 2012-09-28 DIAGNOSIS — E119 Type 2 diabetes mellitus without complications: Secondary | ICD-10-CM | POA: Insufficient documentation

## 2012-09-28 DIAGNOSIS — G4733 Obstructive sleep apnea (adult) (pediatric): Secondary | ICD-10-CM | POA: Insufficient documentation

## 2012-09-28 DIAGNOSIS — I10 Essential (primary) hypertension: Secondary | ICD-10-CM | POA: Insufficient documentation

## 2012-09-28 DIAGNOSIS — Z86711 Personal history of pulmonary embolism: Secondary | ICD-10-CM | POA: Insufficient documentation

## 2012-09-28 DIAGNOSIS — Z01812 Encounter for preprocedural laboratory examination: Secondary | ICD-10-CM | POA: Insufficient documentation

## 2012-09-28 DIAGNOSIS — M48061 Spinal stenosis, lumbar region without neurogenic claudication: Secondary | ICD-10-CM | POA: Insufficient documentation

## 2012-09-28 DIAGNOSIS — Z9889 Other specified postprocedural states: Secondary | ICD-10-CM

## 2012-09-28 HISTORY — PX: LUMBAR LAMINECTOMY/DECOMPRESSION MICRODISCECTOMY: SHX5026

## 2012-09-28 LAB — GLUCOSE, CAPILLARY
Glucose-Capillary: 184 mg/dL — ABNORMAL HIGH (ref 70–99)
Glucose-Capillary: 201 mg/dL — ABNORMAL HIGH (ref 70–99)

## 2012-09-28 SURGERY — LUMBAR LAMINECTOMY/DECOMPRESSION MICRODISCECTOMY 2 LEVELS
Anesthesia: General | Laterality: Left | Wound class: Clean

## 2012-09-28 MED ORDER — DEXTROSE IN LACTATED RINGERS 5 % IV SOLN
INTRAVENOUS | Status: DC
  Administered 2012-09-29: 05:00:00 via INTRAVENOUS

## 2012-09-28 MED ORDER — INSULIN ASPART 100 UNIT/ML ~~LOC~~ SOLN
0.0000 [IU] | Freq: Three times a day (TID) | SUBCUTANEOUS | Status: DC
  Administered 2012-09-29: 11 [IU] via SUBCUTANEOUS
  Administered 2012-09-29: 7 [IU] via SUBCUTANEOUS
  Administered 2012-09-29: 11 [IU] via SUBCUTANEOUS
  Administered 2012-09-30 – 2012-10-01 (×3): 3 [IU] via SUBCUTANEOUS

## 2012-09-28 MED ORDER — INSULIN ASPART 100 UNIT/ML ~~LOC~~ SOLN: 0.0000 [IU] | Freq: Every day | SUBCUTANEOUS | Status: DC

## 2012-09-28 MED ORDER — HYDROMORPHONE HCL PF 1 MG/ML IJ SOLN
0.2500 mg | INTRAMUSCULAR | Status: DC | PRN
  Administered 2012-09-28: 1 mg via INTRAVENOUS
  Administered 2012-09-28 (×2): 0.5 mg via INTRAVENOUS

## 2012-09-28 MED ORDER — HYDROMORPHONE 0.3 MG/ML IV SOLN
INTRAVENOUS | Status: AC
  Administered 2012-09-28: 18:00:00
  Filled 2012-09-28: qty 25

## 2012-09-28 MED ORDER — METHOCARBAMOL 500 MG PO TABS
500.0000 mg | ORAL_TABLET | Freq: Four times a day (QID) | ORAL | Status: DC | PRN
  Administered 2012-09-29 (×2): 500 mg via ORAL
  Filled 2012-09-28 (×2): qty 1

## 2012-09-28 MED ORDER — HYDROMORPHONE HCL PF 1 MG/ML IJ SOLN
INTRAMUSCULAR | Status: AC
  Filled 2012-09-28: qty 1

## 2012-09-28 MED ORDER — PROMETHAZINE HCL 25 MG/ML IJ SOLN: 6.2500 mg | INTRAMUSCULAR | Status: DC | PRN

## 2012-09-28 MED ORDER — ACETAMINOPHEN 10 MG/ML IV SOLN
INTRAVENOUS | Status: AC
  Filled 2012-09-28: qty 100

## 2012-09-28 MED ORDER — THROMBIN 5000 UNITS EX SOLR
CUTANEOUS | Status: AC
  Filled 2012-09-28: qty 10000

## 2012-09-28 MED ORDER — HYDROMORPHONE 0.3 MG/ML IV SOLN
INTRAVENOUS | Status: DC
  Administered 2012-09-28: 2.1 mg via INTRAVENOUS
  Administered 2012-09-29: 2.33 mg via INTRAVENOUS
  Administered 2012-09-29: 2.1 mg via INTRAVENOUS
  Administered 2012-09-29: 07:00:00 via INTRAVENOUS
  Filled 2012-09-28: qty 25

## 2012-09-28 MED ORDER — LACTATED RINGERS IV SOLN
INTRAVENOUS | Status: DC
  Administered 2012-09-28: 1000 mL via INTRAVENOUS
  Administered 2012-09-28: 16:00:00 via INTRAVENOUS

## 2012-09-28 MED ORDER — ACETAMINOPHEN 10 MG/ML IV SOLN
INTRAVENOUS | Status: DC | PRN
  Administered 2012-09-28: 1000 mg via INTRAVENOUS

## 2012-09-28 MED ORDER — HEMOSTATIC AGENTS (NO CHARGE) OPTIME
TOPICAL | Status: DC | PRN
  Administered 2012-09-28: 1 via TOPICAL

## 2012-09-28 MED ORDER — DEXTROSE 5 % IV SOLN: 3.0000 g | INTRAVENOUS | Status: DC

## 2012-09-28 MED ORDER — PROPOFOL 10 MG/ML IV BOLUS
INTRAVENOUS | Status: DC | PRN
  Administered 2012-09-28: 300 mg via INTRAVENOUS

## 2012-09-28 MED ORDER — SUCCINYLCHOLINE CHLORIDE 20 MG/ML IJ SOLN
INTRAMUSCULAR | Status: DC | PRN
  Administered 2012-09-28: 200 mg via INTRAVENOUS

## 2012-09-28 MED ORDER — CISATRACURIUM BESYLATE (PF) 10 MG/5ML IV SOLN
INTRAVENOUS | Status: DC | PRN
  Administered 2012-09-28: 10 mg via INTRAVENOUS

## 2012-09-28 MED ORDER — THROMBIN 5000 UNITS EX SOLR
CUTANEOUS | Status: DC | PRN
  Administered 2012-09-28: 10000 [IU] via TOPICAL

## 2012-09-28 MED ORDER — SODIUM CHLORIDE 0.9 % IR SOLN
Status: DC | PRN
  Administered 2012-09-28: 15:00:00

## 2012-09-28 MED ORDER — ONDANSETRON HCL 4 MG/2ML IJ SOLN
INTRAMUSCULAR | Status: DC | PRN
  Administered 2012-09-28: 4 mg via INTRAVENOUS

## 2012-09-28 MED ORDER — LIDOCAINE HCL (CARDIAC) 20 MG/ML IV SOLN
INTRAVENOUS | Status: DC | PRN
  Administered 2012-09-28: 100 mg via INTRAVENOUS

## 2012-09-28 MED ORDER — VANCOMYCIN HCL 1000 MG IV SOLR
1500.0000 mg | Freq: Once | INTRAVENOUS | Status: AC
  Administered 2012-09-28: 1500 mg via INTRAVENOUS
  Filled 2012-09-28: qty 1500

## 2012-09-28 MED ORDER — POVIDONE-IODINE 7.5 % EX SOLN: Freq: Once | CUTANEOUS | Status: DC

## 2012-09-28 MED ORDER — BUPIVACAINE-EPINEPHRINE 0.5% -1:200000 IJ SOLN
INTRAMUSCULAR | Status: DC | PRN
  Administered 2012-09-28: 10 mL

## 2012-09-28 MED ORDER — BUPIVACAINE-EPINEPHRINE (PF) 0.5% -1:200000 IJ SOLN
INTRAMUSCULAR | Status: AC
  Filled 2012-09-28: qty 10

## 2012-09-28 MED ORDER — METHOCARBAMOL 100 MG/ML IJ SOLN
500.0000 mg | Freq: Four times a day (QID) | INTRAVENOUS | Status: DC | PRN
  Administered 2012-09-28: 500 mg via INTRAVENOUS
  Filled 2012-09-28 (×2): qty 5

## 2012-09-28 MED ORDER — SUFENTANIL CITRATE 50 MCG/ML IV SOLN
INTRAVENOUS | Status: DC | PRN
  Administered 2012-09-28: 15 ug via INTRAVENOUS
  Administered 2012-09-28: 5 ug via INTRAVENOUS
  Administered 2012-09-28: 10 ug via INTRAVENOUS

## 2012-09-28 SURGICAL SUPPLY — 41 items
BAG ZIPLOCK 12X15 (MISCELLANEOUS) ×2 IMPLANT
BENZOIN TINCTURE PRP APPL 2/3 (GAUZE/BANDAGES/DRESSINGS) ×2 IMPLANT
BUR EGG ELITE 4.0 (BURR) ×2 IMPLANT
CLEANER TIP ELECTROSURG 2X2 (MISCELLANEOUS) ×2 IMPLANT
CLOTH BEACON ORANGE TIMEOUT ST (SAFETY) ×2 IMPLANT
CONT SPEC 4OZ CLIKSEAL STRL BL (MISCELLANEOUS) ×2 IMPLANT
DRAIN PENROSE 18X1/4 LTX STRL (WOUND CARE) IMPLANT
DRAPE MICROSCOPE LEICA (MISCELLANEOUS) ×2 IMPLANT
DRAPE POUCH INSTRU U-SHP 10X18 (DRAPES) ×2 IMPLANT
DRAPE SURG 17X11 SM STRL (DRAPES) ×2 IMPLANT
DRSG ADAPTIC 3X8 NADH LF (GAUZE/BANDAGES/DRESSINGS) ×2 IMPLANT
DRSG PAD ABDOMINAL 8X10 ST (GAUZE/BANDAGES/DRESSINGS) ×2 IMPLANT
DURAPREP 26ML APPLICATOR (WOUND CARE) ×2 IMPLANT
ELECT BLADE TIP CTD 4 INCH (ELECTRODE) ×2 IMPLANT
ELECT REM PT RETURN 9FT ADLT (ELECTROSURGICAL) ×2
ELECTRODE REM PT RTRN 9FT ADLT (ELECTROSURGICAL) ×1 IMPLANT
GLOVE BIO SURGEON STRL SZ8 (GLOVE) ×4 IMPLANT
GLOVE ECLIPSE 8.0 STRL XLNG CF (GLOVE) ×2 IMPLANT
GLOVE INDICATOR 8.0 STRL GRN (GLOVE) ×4 IMPLANT
GOWN STRL REIN XL XLG (GOWN DISPOSABLE) ×2 IMPLANT
KIT BASIN OR (CUSTOM PROCEDURE TRAY) ×2 IMPLANT
KIT POSITIONING SURG ANDREWS (MISCELLANEOUS) ×2 IMPLANT
MANIFOLD NEPTUNE II (INSTRUMENTS) ×2 IMPLANT
NEEDLE SPNL 18GX3.5 QUINCKE PK (NEEDLE) ×6 IMPLANT
NS IRRIG 1000ML POUR BTL (IV SOLUTION) ×2 IMPLANT
PATTIES SURGICAL .5 X.5 (GAUZE/BANDAGES/DRESSINGS) IMPLANT
PATTIES SURGICAL .75X.75 (GAUZE/BANDAGES/DRESSINGS) IMPLANT
PATTIES SURGICAL 1X1 (DISPOSABLE) IMPLANT
POSITIONER SURGICAL ARM (MISCELLANEOUS) ×2 IMPLANT
SPONGE GAUZE 4X4 12PLY (GAUZE/BANDAGES/DRESSINGS) ×2 IMPLANT
SPONGE LAP 4X18 X RAY DECT (DISPOSABLE) IMPLANT
SPONGE SURGIFOAM ABS GEL 100 (HEMOSTASIS) ×2 IMPLANT
STAPLER VISISTAT 35W (STAPLE) IMPLANT
SUT VIC AB 1 CT1 27 (SUTURE) ×2
SUT VIC AB 1 CT1 27XBRD ANTBC (SUTURE) ×2 IMPLANT
SUT VIC AB 2-0 CT1 27 (SUTURE) ×2
SUT VIC AB 2-0 CT1 27XBRD (SUTURE) ×2 IMPLANT
TAPE CLOTH SURG 6X10 WHT LF (GAUZE/BANDAGES/DRESSINGS) ×2 IMPLANT
TOWEL OR 17X26 10 PK STRL BLUE (TOWEL DISPOSABLE) ×4 IMPLANT
TRAY LAMINECTOMY (CUSTOM PROCEDURE TRAY) ×2 IMPLANT
WATER STERILE IRR 1500ML POUR (IV SOLUTION) ×2 IMPLANT

## 2012-09-28 NOTE — Anesthesia Postprocedure Evaluation (Signed)
  Anesthesia Post-op Note  Patient: [name]  Procedure(s) Performed: Procedure(s) (LRB): LUMBAR LAMINECTOMY/DECOMPRESSION MICRODISCECTOMY 2 LEVELS (Left)  Patient Location: PACU  Anesthesia Type: General  Level of Consciousness: awake and alert   Airway and Oxygen Therapy: Patient Spontanous Breathing  Post-op Pain: mild  Post-op Assessment: Post-op Vital signs reviewed, Patient's Cardiovascular Status Stable, Respiratory Function Stable, Patent Airway and No signs of Nausea or vomiting  Post-op Vital Signs: stable  Complications: No apparent anesthesia complications

## 2012-09-28 NOTE — Transfer of Care (Signed)
Immediate Anesthesia Transfer of Care Note  Patient: [name]  Procedure(s) Performed: Procedure(s) (LRB) with comments: LUMBAR LAMINECTOMY/DECOMPRESSION MICRODISCECTOMY 2 LEVELS (Left) - Decompressive laminectomy L4-L5. Microdiscectomy L5-S1  Patient Location: PACU  Anesthesia Type: General  Level of Consciousness: awake and alert   Airway & Oxygen Therapy: Patient Spontanous Breathing and Patient connected to face mask oxygen  Post-op Assessment: Report given to PACU RN and Post -op Vital signs reviewed and stable  Post vital signs: Reviewed and stable  Complications: No apparent anesthesia complications

## 2012-09-28 NOTE — Progress Notes (Signed)
Called to room 1609 per 6 Lynford Humphrey RN at 2245. Pt post op back surgery today, lethargic on PCA with snoring respirations. Upon my arrival at 2250 pt found in bed snoring loudly. Awakens to light painful stimuli able to state name location and situation, denies pain falls asleep easy. Pt follows simple commands.  Pt lungs fairly clear. Pt placed on co2 monitor yielding co2 62-65. VS bp 131/76, hr 97 SR, rr 17, po2 97 on 4 LNC. Pt hx states pt has chronic sleep apnea and uses a cpap machine. Dr Victorino Dike paged to update on pt status at 2300, MD updated on status and elevated co2 on monitor orders received for cpap and ABG. CPAP placed on pt and ABG resulted Dr Victorino Dike called back [phone]. Call back received from Dr Victorino Dike, Molly Maduro RN read back critical ABG results, Bipap ordered and placed per respiratory therapy. Pt more arousable awakens to name. Will leave pt on 6 east with continuous co2 module on with continuous pulse oximeter on. [name] RN encouraged to call Respiratory and Rapid response for further problems.

## 2012-09-28 NOTE — Progress Notes (Addendum)
Called to pt beside for rapid response. Pts ET CO2 monitor was in the 60's on 2 LPM [place]. Pt lathargic, but arousable. Per Rapid response pt has sleep apnea and wears CPAP at night. ABG drawn on 2LPM [place] and at the same time pt place on CPAP at10 cmH2O. After blood gas results order was received to place on BiPAP. Pt placed on 14/6 with 2 LPM bleed in. Pt tolerating well at this time. RN aware and at bedside. Will continue to monitor.

## 2012-09-28 NOTE — Brief Op Note (Signed)
09/28/2012  5:26 PM  PATIENT:  Rosalyn Charters  56 y.o. male  PRE-OPERATIVE DIAGNOSIS:  Herniated Disc  POST-OPERATIVE DIAGNOSIS:  Herniated Disc  PROCEDURE:  Procedure(s) (LRB) with comments: LUMBAR LAMINECTOMY/DECOMPRESSION MICRODISCECTOMY 2 LEVELS (Left) - Decompressive laminectomy L4-L5. Microdiscectomy L5-S1  SURGEON:  Surgeon(s) and Role:    * Drucilla Schmidt, MD - Primary    * Jacki Cones, MD - Assisting  PHYSICIAN ASSISTANT:   ASSISTANTS:nurse   ANESTHESIA:   local and general  EBL:  Total I/O In: 1000 [I.V.:1000] Out: -   BLOOD ADMINISTERED:none  DRAINS: none and Urinary Catheter (Suprapubic)   LOCAL MEDICATIONS USED:  MARCAINE     SPECIMEN:  Source of Specimen:  herniated disc  DISPOSITION OF SPECIMEN:  PATHOLOGY  COUNTS:  YES  TOURNIQUET:  * No tourniquets in log *  DICTATION: .Other Dictation: Dictation Number [id number]  PLAN OF CARE: Admit for overnight observation  PATIENT DISPOSITION:  PACU - hemodynamically stable.   Delay start of Pharmacological VTE agent (>24hrs) due to surgical blood loss or risk of bleeding: yes

## 2012-09-28 NOTE — Anesthesia Preprocedure Evaluation (Addendum)
Anesthesia Evaluation  Patient identified by MRN, date of birth, ID band Patient awake    Reviewed: Allergy & Precautions, H&P , NPO status , Patient's Chart, lab work & pertinent test results  Airway Mallampati: IV TM Distance: <3 FB Neck ROM: Full    Dental No notable dental hx.    Pulmonary sleep apnea ,  breath sounds clear to auscultation  + decreased breath sounds      Cardiovascular hypertension, negative cardio ROS  Rhythm:Regular Rate:Normal     Neuro/Psych negative neurological ROS  negative psych ROS   GI/Hepatic negative GI ROS, Neg liver ROS,   Endo/Other  negative endocrine ROSdiabetesMorbid obesity  Renal/GU negative Renal ROS  negative genitourinary   Musculoskeletal negative musculoskeletal ROS (+)   Abdominal   Peds negative pediatric ROS (+)  Hematology negative hematology ROS (+)   Anesthesia Other Findings   Reproductive/Obstetrics negative OB ROS                          Anesthesia Physical Anesthesia Plan  ASA: III  Anesthesia Plan: General   Post-op Pain Management:    Induction: Intravenous  Airway Management Planned: Oral ETT and Video Laryngoscope Planned  Additional Equipment:   Intra-op Plan:   Post-operative Plan: Extubation in OR  Informed Consent: I have reviewed the patients History and Physical, chart, labs and discussed the procedure including the risks, benefits and alternatives for the proposed anesthesia with the patient or authorized representative who has indicated his/her understanding and acceptance.   Dental advisory given  Plan Discussed with: CRNA and Surgeon  Anesthesia Plan Comments:        Anesthesia Quick Evaluation

## 2012-09-28 NOTE — H&P (Signed)
[name] is an 56 y.o. male.   Chief Complaintlow back and lt leg pain HPI:MRI demonstrates HNP and spinal senosis L4-5 and L5-S1 left  Past Medical History  Diagnosis Date  . Diabetes mellitus     type II  . Hyperlipidemia   . Hypertension   . DVT, lower extremity     right  . Embolus 2005    pulmonary  . Depression   . Obesity   . BACK PAIN   . COLONIC POLYPS, HX OF   . DEGENERATIVE JOINT DISEASE, KNEES, BILATERAL   . DEPRESSION   . DIVERTICULOSIS, COLON   . HYPOGONADISM, MALE   . HYPOTENSION, ORTHOSTATIC   . OBESITY, MORBID   . Long term current use of anticoagulant   . Obstructive sleep apnea     uses cpap setting 8 to 10  . Kidney stones   . Renal insufficiency   . RENAL INSUFFICIENCY   . Shortness of breath     Past Surgical History  Procedure Date  . Knee surgury 20-Apr-1999 & Apr 19, 2002     cartilage damage  . Ankle surgury 2001 bilateral    with bone spurs and torn tendon  . Edg 2007-04-20    chronic Duodenitis  . Tonsillectomy and adenoidectomy age 2 or 32  . Total knee arthroplasty 01/10/2012    Procedure: TOTAL KNEE ARTHROPLASTY;  Surgeon: Shelda Pal, MD;  Location: [facility];  Service: Orthopedics;  Laterality: Left;    Family History  Problem Relation Age of Onset  . Hypertension Mother   . Prostate cancer Father   . Diabetes Sister     died with DM 2001-04-19, 2 more sisters with diabetes  . Diabetes Brother    Social History:  reports that he quit smoking about 33 years ago. He does not have any smokeless tobacco history on file. He reports that he drinks alcohol. He reports that he does not use illicit drugs.  Allergies:  Allergies  Allergen Reactions  . Other     NO BLOOD -JEHOVAH'S WITNESS-SIGNED REFUSAL BUT WOULD TAKE ALBUMIN    Medications Prior to Admission  Medication Sig Dispense Refill  . albuterol (PROAIR HFA) 108 (90 BASE) MCG/ACT inhaler Inhale 2 puffs into the lungs every 4 (four) hours as needed. For shortness of breath      . aspirin  (ECOTRIN LOW STRENGTH) 81 MG EC tablet Take 81 mg by mouth daily before breakfast.       . DULoxetine (CYMBALTA) 60 MG capsule Take 60 mg by mouth every morning.      . ezetimibe (ZETIA) 10 MG tablet Take 10 mg by mouth at bedtime.      Marland Kitchen glipiZIDE (GLUCOTROL) 10 MG tablet Take 10 mg by mouth 2 (two) times daily before a meal.      . hydrochlorothiazide (HYDRODIURIL) 25 MG tablet Take 25 mg by mouth every morning.      Marland Kitchen HYDROcodone-acetaminophen (NORCO/VICODIN) 5-325 MG per tablet Take 1 tablet by mouth every 6 (six) hours as needed. For pain      . insulin glargine (LANTUS SOLOSTAR) 100 UNIT/ML injection Inject 63 Units into the skin at bedtime.      Marland Kitchen lisinopril (PRINIVIL,ZESTRIL) 5 MG tablet Take 5 mg by mouth every morning.      . metFORMIN (GLUCOPHAGE) 1000 MG tablet Take 1,000 mg by mouth 2 (two) times daily with a meal.      . simvastatin (ZOCOR) 80 MG tablet Take 80 mg by mouth every  evening.       Marland Kitchen acetaminophen (TYLENOL) 325 MG tablet Take 2 tablets (650 mg total) by mouth every 6 (six) hours as needed (or Fever >/= 101).      Marland Kitchen aspirin EC 81 MG tablet Take 81 mg by mouth every morning.      . warfarin (COUMADIN) 1 MG tablet Take 1 mg by mouth every Monday, Wednesday, and Friday. Take 11mg  on mon, wed, and fri. 10mg  on all other days.      Marland Kitchen warfarin (COUMADIN) 5 MG tablet Take 10 mg by mouth daily. Take 11mg  on mon, wed, and fri. 10mg  on all other days.        Results for orders placed during the hospital encounter of 09/28/12 (from the past 48 hour(s))  GLUCOSE, CAPILLARY     Status: Abnormal   Collection Time   09/28/12 11:03 AM      Component Value Range Comment   Glucose-Capillary 189 (*) 70 - 99 mg/dL    No results found.  ROS  Blood pressure 179/99, pulse 75, temperature 98 F (36.7 C), temperature source Oral, resp. rate 18, SpO2 93.00%. Physical Exam  Constitutional: He is oriented to person, place, and time. He appears well-developed and well-nourished.        Morbid obsity  HENT:  Head: Normocephalic and atraumatic.  Right Ear: External ear normal.  Left Ear: External ear normal.  Nose: Nose normal.  Mouth/Throat: Oropharynx is clear and moist.  Eyes: Conjunctivae normal and EOM are normal. Pupils are equal, round, and reactive to light.  Neck: Normal range of motion. Neck supple.  Cardiovascular: Normal rate, regular rhythm, normal heart sounds and intact distal pulses.   Respiratory: Effort normal. He has rales.  GI: Soft. Bowel sounds are normal.  Musculoskeletal: Normal range of motion.       Decreased sensation  L5 and S1 left foot  Neurological: He is alert and oriented to person, place, and time. He has normal reflexes.  Skin: Skin is warm and dry.  Psychiatric: He has a normal mood and affect. His behavior is normal. Judgment and thought content normal.     Assessment/Plan HNP and lateral recess stenosis L4-5 and L5-S1 left Microdiscectomy and lateral recess decompression  L45 and L5-S1 left  [name] P 09/28/2012, 12:52 PM

## 2012-09-29 ENCOUNTER — Encounter (HOSPITAL_COMMUNITY): Payer: Self-pay | Admitting: Orthopedic Surgery

## 2012-09-29 LAB — BLOOD GAS, ARTERIAL
Drawn by: 11249
O2 Content: 2 L/min
pCO2 arterial: 78 mmHg (ref 35.0–45.0)
pH, Arterial: 7.189 — CL (ref 7.350–7.450)
pO2, Arterial: 77.1 mmHg — ABNORMAL LOW (ref 80.0–100.0)

## 2012-09-29 LAB — GLUCOSE, CAPILLARY
Glucose-Capillary: 269 mg/dL — ABNORMAL HIGH (ref 70–99)
Glucose-Capillary: 254 mg/dL — ABNORMAL HIGH (ref 70–99)

## 2012-09-29 MED ORDER — ONDANSETRON HCL 4 MG PO TABS: 4.0000 mg | ORAL_TABLET | Freq: Four times a day (QID) | ORAL | Status: DC | PRN

## 2012-09-29 MED ORDER — INSULIN GLARGINE 100 UNIT/ML ~~LOC~~ SOLN
63.0000 [IU] | Freq: Every day | SUBCUTANEOUS | Status: DC
  Administered 2012-09-29 – 2012-09-30 (×2): 63 [IU] via SUBCUTANEOUS

## 2012-09-29 MED ORDER — METFORMIN HCL 500 MG PO TABS
1000.0000 mg | ORAL_TABLET | Freq: Two times a day (BID) | ORAL | Status: DC
  Administered 2012-09-29 – 2012-10-01 (×5): 1000 mg via ORAL
  Filled 2012-09-29 (×7): qty 2

## 2012-09-29 MED ORDER — WARFARIN SODIUM 7.5 MG PO TABS
15.0000 mg | ORAL_TABLET | Freq: Once | ORAL | Status: AC
  Administered 2012-09-29: 15 mg via ORAL
  Filled 2012-09-29: qty 2

## 2012-09-29 MED ORDER — WARFARIN - PHARMACIST DOSING INPATIENT: Freq: Every day | Status: DC

## 2012-09-29 MED ORDER — SODIUM CHLORIDE 0.9 % IV SOLN
INTRAVENOUS | Status: DC
  Administered 2012-09-29: 20 mL/h via INTRAVENOUS

## 2012-09-29 MED ORDER — DULOXETINE HCL 60 MG PO CPEP
60.0000 mg | ORAL_CAPSULE | Freq: Every morning | ORAL | Status: DC
  Administered 2012-09-29 – 2012-10-01 (×3): 60 mg via ORAL
  Filled 2012-09-29 (×3): qty 1

## 2012-09-29 MED ORDER — ONDANSETRON HCL 4 MG/2ML IJ SOLN: 4.0000 mg | Freq: Four times a day (QID) | INTRAMUSCULAR | Status: DC | PRN

## 2012-09-29 MED ORDER — SODIUM CHLORIDE 0.9 % IJ SOLN: 9.0000 mL | INTRAMUSCULAR | Status: DC | PRN

## 2012-09-29 MED ORDER — LISINOPRIL 5 MG PO TABS
5.0000 mg | ORAL_TABLET | Freq: Every morning | ORAL | Status: DC
  Administered 2012-09-29 – 2012-10-01 (×3): 5 mg via ORAL
  Filled 2012-09-29 (×3): qty 1

## 2012-09-29 MED ORDER — GLIPIZIDE 10 MG PO TABS
10.0000 mg | ORAL_TABLET | Freq: Two times a day (BID) | ORAL | Status: DC
Start: 2012-09-29 — End: 2012-10-01
  Administered 2012-09-29 – 2012-10-01 (×5): 10 mg via ORAL
  Filled 2012-09-29 (×7): qty 1

## 2012-09-29 MED ORDER — NALOXONE HCL 0.4 MG/ML IJ SOLN: 0.4000 mg | INTRAMUSCULAR | Status: DC | PRN

## 2012-09-29 MED ORDER — ALBUTEROL SULFATE HFA 108 (90 BASE) MCG/ACT IN AERS
2.0000 | INHALATION_SPRAY | RESPIRATORY_TRACT | Status: DC | PRN
  Filled 2012-09-29: qty 6.7

## 2012-09-29 MED ORDER — METOCLOPRAMIDE HCL 5 MG/ML IJ SOLN: 5.0000 mg | Freq: Three times a day (TID) | INTRAMUSCULAR | Status: DC | PRN

## 2012-09-29 MED ORDER — EZETIMIBE 10 MG PO TABS
10.0000 mg | ORAL_TABLET | Freq: Every day | ORAL | Status: DC
  Administered 2012-09-29 – 2012-09-30 (×2): 10 mg via ORAL
  Filled 2012-09-29 (×3): qty 1

## 2012-09-29 MED ORDER — WARFARIN SODIUM 1 MG PO TABS: 1.0000 mg | ORAL_TABLET | ORAL | Status: DC

## 2012-09-29 MED ORDER — HYDROMORPHONE HCL PF 1 MG/ML IJ SOLN: 0.5000 mg | INTRAMUSCULAR | Status: DC | PRN

## 2012-09-29 MED ORDER — METOCLOPRAMIDE HCL 10 MG PO TABS: 5.0000 mg | ORAL_TABLET | Freq: Three times a day (TID) | ORAL | Status: DC | PRN

## 2012-09-29 MED ORDER — WARFARIN SODIUM 10 MG PO TABS: 10.0000 mg | ORAL_TABLET | Freq: Every day | ORAL | Status: DC

## 2012-09-29 MED ORDER — HYDROCODONE-ACETAMINOPHEN 10-325 MG PO TABS
1.0000 | ORAL_TABLET | ORAL | Status: DC | PRN
  Administered 2012-09-29 – 2012-10-01 (×10): 1 via ORAL
  Filled 2012-09-29 (×10): qty 1

## 2012-09-29 MED ORDER — VANCOMYCIN HCL IN DEXTROSE 1-5 GM/200ML-% IV SOLN
1000.0000 mg | Freq: Two times a day (BID) | INTRAVENOUS | Status: AC
  Administered 2012-09-29: 1000 mg via INTRAVENOUS
  Filled 2012-09-29: qty 200

## 2012-09-29 MED ORDER — DIPHENHYDRAMINE HCL 50 MG/ML IJ SOLN: 12.5000 mg | Freq: Four times a day (QID) | INTRAMUSCULAR | Status: DC | PRN

## 2012-09-29 MED ORDER — DIPHENHYDRAMINE HCL 12.5 MG/5ML PO ELIX: 12.5000 mg | ORAL_SOLUTION | Freq: Four times a day (QID) | ORAL | Status: DC | PRN

## 2012-09-29 MED ORDER — HYDROCHLOROTHIAZIDE 25 MG PO TABS
25.0000 mg | ORAL_TABLET | Freq: Every morning | ORAL | Status: DC
  Administered 2012-09-29 – 2012-10-01 (×3): 25 mg via ORAL
  Filled 2012-09-29 (×3): qty 1

## 2012-09-29 MED ORDER — ASPIRIN 81 MG PO TBEC: 81.0000 mg | DELAYED_RELEASE_TABLET | Freq: Every day | ORAL | Status: DC

## 2012-09-29 MED ORDER — ASPIRIN EC 81 MG PO TBEC
81.0000 mg | DELAYED_RELEASE_TABLET | Freq: Every morning | ORAL | Status: DC
  Administered 2012-09-29 – 2012-10-01 (×3): 81 mg via ORAL
  Filled 2012-09-29 (×3): qty 1

## 2012-09-29 NOTE — Progress Notes (Signed)
Physical Therapy Treatment Patient Details Name: [name] MRN: [id number] DOB: 1955-12-28 Today's Date: 09/29/2012 Time: 1437-1500 PT Time Calculation (min): 23 min  PT Assessment / Plan / Recommendation Comments on Treatment Session       Follow Up Recommendations  Home health PT     Does the patient have the potential to tolerate intense rehabilitation     Barriers to Discharge None      Equipment Recommendations  None recommended by OT;None recommended by PT    Recommendations for Other Services OT consult  Frequency 7X/week   Plan      Precautions / Restrictions Precautions Precautions: Back Restrictions Weight Bearing Restrictions: No   Pertinent Vitals/Pain 5/10    Mobility  Bed Mobility Bed Mobility: Sit to Supine Supine to Sit: 1: +2 Total assist Supine to Sit: Patient Percentage: 70% Sit to Supine: 1: +2 Total assist Sit to Supine: Patient Percentage: 80% Details for Bed Mobility Assistance: cues and physical assist for correct log roll technique and move to sitting Transfers Transfers: Sit to Stand;Stand to Sit Sit to Stand: 4: Min assist;3: Mod assist Stand to Sit: 4: Min assist;3: Mod assist Details for Transfer Assistance: cues for use of UEs, LE positioning and mainenance of erect spine for transfers Ambulation/Gait Ambulation/Gait Assistance: 4: Min assist Ambulation Distance (Feet): 198 Feet Assistive device: Rolling walker Ambulation/Gait Assistance Details: cues for posture and position from RW Gait Pattern: Step-to pattern;Step-through pattern    Exercises     PT Diagnosis: Difficulty walking  PT Problem List: Decreased strength;Decreased activity tolerance;Decreased mobility;Decreased knowledge of use of DME;Decreased knowledge of precautions;Obesity;Pain PT Treatment Interventions: DME instruction;Gait training;Stair training;Functional mobility training;Therapeutic activities;Patient/family education   PT Goals Acute Rehab PT  Goals PT Goal Formulation: With patient Time For Goal Achievement: 10/03/12 Potential to Achieve Goals: Good Pt will go Supine/Side to Sit: with supervision PT Goal: Supine/Side to Sit - Progress: Goal set today Pt will go Sit to Supine/Side: with supervision PT Goal: Sit to Supine/Side - Progress: Goal set today Pt will go Sit to Stand: with supervision PT Goal: Sit to Stand - Progress: Progressing toward goal Pt will go Stand to Sit: with supervision PT Goal: Stand to Sit - Progress: Progressing toward goal Pt will Ambulate: >150 feet;with supervision;with rolling walker PT Goal: Ambulate - Progress: Progressing toward goal Pt will Go Up / Down Stairs: 3-5 stairs;with min assist;with least restrictive assistive device PT Goal: Up/Down Stairs - Progress: Goal set today  Visit Information  Last PT Received On: 09/29/12 Assistance Needed: +2 PT/OT Co-Evaluation/Treatment: Yes    Subjective Data  Subjective: I need to get out of this chair Patient Stated Goal: Resume previous lifestyle with decreased pain   Cognition  Overall Cognitive Status: Appears within functional limits for tasks assessed/performed Arousal/Alertness: Awake/alert Orientation Level: Appears intact for tasks assessed Behavior During Session: [facility] for tasks performed    Balance     End of Session PT - End of Session Activity Tolerance: Patient tolerated treatment well Patient left: with call bell/phone within reach;in bed Nurse Communication: Mobility status   GP Functional Assessment Tool Used: clinical judgement Functional Limitation: Mobility: Walking and moving around Mobility: Walking and Moving Around Current Status (A2130): At least 20 percent but less than 40 percent impaired, limited or restricted Mobility: Walking and Moving Around Goal Status [phone]): At least 1 percent but less than 20 percent impaired, limited or restricted   [facility] 09/29/2012, 5:54 PM

## 2012-09-29 NOTE — Progress Notes (Signed)
Patient ID: [name], male   DOB: 11-13-1956, 56 y.o.   MRN: [id number] POD 1--pre-op symptoms have been relieved.  Will remove foley,have PT see.  He feels he still needs iv pain med.

## 2012-09-29 NOTE — Op Note (Signed)
NAMEGARNEY, MINION               ACCOUNT NO.:  [id number]  MEDICAL RECORD NO.:  [id number]  LOCATION:  1609                         FACILITY:  [facility]  PHYSICIAN:  Marlowe Kays, M.D.  DATE OF BIRTH:  Oct 15, 1956  DATE OF PROCEDURE:  09/28/2012 DATE OF DISCHARGE:                              OPERATIVE REPORT   PREOPERATIVE DIAGNOSES: 1. Herniated nucleus pulposus with some lateral recess stenosis L5-S1     left. 2. Lateral recess stenosis with disk bulge at L4-5 left.  POSTOPERATIVE DIAGNOSES: 1. Herniated nucleus pulposus with some lateral recess stenosis L5-S1     left. 2. Lateral recess stenosis with disk bulge at L4-5 left.  OPERATION: 1. Microdiskectomy with lateral recess decompression at L5-S1, left. 2. Wide decompressive laminectomy L4-5, left.  SURGEON:  Marlowe Kays, M.D.  ASSISTANT:  Georges Lynch. Darrelyn Hillock, M.D.  ANESTHESIA:  General.  PATHOLOGY AND JUSTIFICATION FOR PROCEDURE:  He was having entirely left leg pain with an MRI demonstrating the preoperative diagnoses.  The dominant finding was the disk herniation at L5-S1.  He had failed to respond to nonsurgical treatment.  Despite him being less than normal health risk for surgery, it was felt that we had no other options and the patient was willing to undertake the surgery realizing these risks.  PROCEDURE:  Prophylactic antibiotics, satisfactory general anesthesia, Foley catheter inserted, placed prone on rolls, and his back was prepped with DuraPrep and with 3 spinal needles and lateral x-ray, I tentatively demarcate the L4 to sacral surgical range.  I then continued draping the back in a sterile field.  Midline incision with some slight dissection of soft tissue off the neural arches of L4-L5 in order to place a broad McCullough retractor.  On the left side, I dissected soft tissue off what was felt to be L4 to the sacrum.  A 2nd lateral x-ray was taken with Kocher clamps, on spinous process what we  thought were L4-L5, confirming that this was the case.  After completing soft tissue dissection from L4 to the sacrum on the left, I then placed self- retaining McCullough retractors, and we brought in the microscope for most of the procedure 1st at L5-S1.  I undermined the superior portion of the sacrum and the undersurface of L5 and working 1st over the sacrum.  I used a combination of curette and 2 mm Kerrison rongeur to perform a wide foraminotomy and working laterally, removing bone.  He had a fairly significant lateral recess stenosis.  The S1 nerve root was identified and with the third x-ray, we identified the location of the L5-S1 disk, which was not immediately apparent because of inflammatory scar.  The disk was identified and spinal needle and opened with a 15 knife blade, and we removed a good bit of small particulate pieces of disk and 1 large fragment in particular.  This seemed to thoroughly decompress the disk space.  The S1 nerve root foramen was well decompressed as well.  This space was irrigated with saline and then covered with Gelfoam and I moved to L4-5.  He had significant shingling requiring a good bit of bony decompression.  He was felt to have subarticular lateral stenosis and this  indeed is what we found with bony impingement on the dura and the L5 nerve root.  Once we had thoroughly decompressed this area, the L5 nerve root and dura were well decompressed.  The meninges were not highly mobile, however, and because of the significant bony pathology and the morbid disk bulge on the MRI, we did not pursue looking at the desk.  The wound was irrigated with sterile saline and Gelfoam placed over the decompression at L4-5.  I then removed the self-retaining McCullough retractors.  There was no unusual bleeding.  I closed the wound in layers with interrupted #1 Vicryl in the fascia and deep subcutaneous tissue, 2-0 Vicryl in the superficial subcutaneous tissue, and  staples in the skin.  Betadine, Adaptic, dry sterile dressing were applied.  He was gently moved to his PACU bed and taken in a satisfactory condition with no known complication.  Blood loss was perhaps 200 mL.  No blood replacement.          ______________________________ Marlowe Kays, M.D.     JA/MEDQ  D:  09/28/2012  T:  09/29/2012  Job:  [id number]

## 2012-09-29 NOTE — Progress Notes (Signed)
ANTICOAGULATION CONSULT NOTE - Initial Consult  Pharmacy Consult for Coumadin Indication: h/o PE, now s/p lumbar laminectomy/decompression microdiscectomy   Allergies  Allergen Reactions  . Other     NO BLOOD -JEHOVAH'S WITNESS-SIGNED REFUSAL BUT WOULD TAKE ALBUMIN    Patient Measurements: Height: 6\' 2"  (188 cm) Weight: 370 lb (167.831 kg) IBW/kg (Calculated) : 82.2   Labs:  Basename 09/26/12 1000  HGB 12.5*  HCT 40.6  PLT 237  APTT 29  LABPROT 13.0  INR 0.99  HEPARINUNFRC --  CREATININE 0.95  CKTOTAL --  CKMB --  TROPONINI --    Estimated Creatinine Clearance: 139.5 ml/min (by C-G formula based on Cr of 0.95).   Medical History: Past Medical History  Diagnosis Date  . Diabetes mellitus     type II  . Hyperlipidemia   . Hypertension   . DVT, lower extremity     right  . Embolus 2005    pulmonary  . Depression   . Obesity   . BACK PAIN   . COLONIC POLYPS, HX OF   . DEGENERATIVE JOINT DISEASE, KNEES, BILATERAL   . DEPRESSION   . DIVERTICULOSIS, COLON   . HYPOGONADISM, MALE   . HYPOTENSION, ORTHOSTATIC   . OBESITY, MORBID   . Long term current use of anticoagulant   . Obstructive sleep apnea     uses cpap setting 8 to 10  . Kidney stones   . Renal insufficiency   . RENAL INSUFFICIENCY   . Shortness of breath     Assessment:  56 yom on Coumadin PTA for h/o PE, being followed by at [facility] Coumadin clinic. Per clinic note 9/30, pt was to be on Coumadin 11.5 mg on MWF and 10 mg other days. Per patient, he has been taking 11mg  on MWF and 10mg  other days. Pt's reported last Coumadin dose 10/17, stopping Coumadin for lumbar laminectomy and decompression microdiscectomy 10/24. MD ordered to resume Coumadin on 10/25. INR was 0.99 on 10/22 and CBC okay.   Goal of Therapy:  INR 2-3 Monitor platelets by anticoagulation protocol: Yes   Plan:   Coumadin 15mg  po x 1 tonight (1.5x home dose per policy)  Daily PT/INR  Pharmacy will f/u  Geoffry Paradise  Thi 09/29/2012,8:32 AM

## 2012-09-29 NOTE — Progress Notes (Signed)
Pt could be heard in hall with loud, snoring, 'gasping' respirations. Entering the room, the patient was difficult to arouse even with moderately painful sternal rub, and very lethargic.  BP:171/119 HR:90 R:12.CBG:201. After repeated attempts with little success of arousing the patient, Rapid Response nurse, Brooke, Was notified.  See RRT note and respiratory notes.

## 2012-09-29 NOTE — Care Management Note (Unsigned)
    Page 1 of 2   09/30/2012     7:27:12 PM   CARE MANAGEMENT NOTE 09/30/2012  Patient:  [name], [name]   Account Number:  [id number]  Date Initiated:  09/29/2012  Documentation initiated by:  Colleen Can  Subjective/Objective Assessment:   DX HERNIATED DISC; MICRO-DISSECTOMY WITH DECOPRESSION LAMINECTOMY     Action/Plan:   CM SPOKE WITH PATIENT. PLANS ARE FOR PATIENT TO RETUN TO HIS HOME IN [place] WHERE SPOUSE WILL BE CAREGIVER. HE ALREADY HAS RW  WILL NEED HHPT   Anticipated DC Date:  10/01/2012   Anticipated DC Plan:  HOME W HOME HEALTH SERVICES  In-house referral  Clinical Social Worker      DC Associate Professor  CM consult      [facility] Choice  HOME HEALTH   Choice offered to / List presented to:  C-1 Patient   DME arranged  NA      DME agency  NA     HH arranged  HH-2 PT      [facility] agency  [employer]   Status of service:  Completed, signed off Medicare Important Message given?  NO (If response is "NO", the following Medicare IM given date fields will be blank) Date Medicare IM given:   Date Additional Medicare IM given:    Discharge Disposition:    Per UR Regulation:  Reviewed for med. necessity/level of care/duration of stay  If discussed at Long Length of Stay Meetings, dates discussed:    Comments:  09/30/12 Jesper Stirewalt RN,BSN NCM [phone] LEFT MESSAGE FOR [facility](LIASON)TEL#[phone],FAX#[phone] INFORMED OF NEEDING HHPT/OT @ D/C.

## 2012-09-29 NOTE — Evaluation (Signed)
Physical Therapy Evaluation Patient Details Name: [name] MRN: [id number] DOB: 1956/12/04 Today's Date: 09/29/2012 Time: 4098-1191 PT Time Calculation (min): 24 min  PT Assessment / Plan / Recommendation Clinical Impression  Pt s/p back surgery presents functional mobility limitations 2* to post op back pain and back precautions    PT Assessment  Patient needs continued PT services    Follow Up Recommendations  Home health PT    Does the patient have the potential to tolerate intense rehabilitation      Barriers to Discharge None      Equipment Recommendations  None recommended by OT;None recommended by PT    Recommendations for Other Services OT consult   Frequency 7X/week    Precautions / Restrictions Precautions Precautions: Back Restrictions Weight Bearing Restrictions: No   Pertinent Vitals/Pain 5/10      Mobility  Bed Mobility Bed Mobility: Supine to Sit Supine to Sit: 1: +2 Total assist Supine to Sit: Patient Percentage: 70% Details for Bed Mobility Assistance: cues and physical assist for correct log roll technique and move to sitting Transfers Transfers: Sit to Stand;Stand to Sit Sit to Stand: 4: Min assist;3: Mod assist Stand to Sit: 4: Min assist;3: Mod assist Details for Transfer Assistance: cues for use of UEs, LE positioning and mainenance of erect spine for transfers Ambulation/Gait Ambulation/Gait Assistance: 4: Min assist Ambulation Distance (Feet): 53 Feet Assistive device: Rolling walker Ambulation/Gait Assistance Details: cues for posture and position from RW Gait Pattern: Step-to pattern;Step-through pattern    Shoulder Instructions     Exercises     PT Diagnosis: Difficulty walking  PT Problem List: Decreased strength;Decreased activity tolerance;Decreased mobility;Decreased knowledge of use of DME;Decreased knowledge of precautions;Obesity;Pain PT Treatment Interventions: DME instruction;Gait training;Stair  training;Functional mobility training;Therapeutic activities;Patient/family education   PT Goals Acute Rehab PT Goals PT Goal Formulation: With patient Time For Goal Achievement: 10/03/12 Potential to Achieve Goals: Good Pt will go Supine/Side to Sit: with supervision PT Goal: Supine/Side to Sit - Progress: Goal set today Pt will go Sit to Supine/Side: with supervision PT Goal: Sit to Supine/Side - Progress: Goal set today Pt will go Sit to Stand: with supervision PT Goal: Sit to Stand - Progress: Goal set today Pt will go Stand to Sit: with supervision PT Goal: Stand to Sit - Progress: Goal set today Pt will Ambulate: >150 feet;with supervision;with rolling walker PT Goal: Ambulate - Progress: Goal set today Pt will Go Up / Down Stairs: 3-5 stairs;with min assist;with least restrictive assistive device PT Goal: Up/Down Stairs - Progress: Goal set today  Visit Information  Last PT Received On: 09/29/12 Assistance Needed: +2    Subjective Data  Subjective: I'm ready to get up Patient Stated Goal: Resume previous lifestyle with decreased pain   Prior Functioning  Home Living Lives With: Alone Available Help at Discharge: Family;Friend(s) Type of Home: House Home Access: Stairs to enter Secretary/administrator of Steps: 4 Entrance Stairs-Rails: Right;Left Home Layout: One level Bathroom Shower/Tub: Tub/shower unit (grab bars) Bathroom Toilet: Standard (with 3:1) Home Adaptive Equipment: Bedside commode/3-in-1 (AE kit) Prior Function Level of Independence: Independent Communication Communication: No difficulties    Cognition  Overall Cognitive Status: Appears within functional limits for tasks assessed/performed Arousal/Alertness: Awake/alert Orientation Level: Appears intact for tasks assessed Behavior During Session: [facility] for tasks performed    Extremity/Trunk Assessment Right Upper Extremity Assessment RUE ROM/Strength/Tone: [facility] for tasks assessed Left Upper  Extremity Assessment LUE ROM/Strength/Tone: [facility] for tasks assessed Right Lower Extremity Assessment RLE ROM/Strength/Tone: [facility]  for tasks assessed Left Lower Extremity Assessment LLE ROM/Strength/Tone: [facility] for tasks assessed   Balance    End of Session PT - End of Session Activity Tolerance: Patient tolerated treatment well Patient left: in chair;with call bell/phone within reach Nurse Communication: Mobility status  GP Functional Assessment Tool Used: clinical judgement Functional Limitation: Mobility: Walking and moving around Mobility: Walking and Moving Around Current Status (Q6578): At least 20 percent but less than 40 percent impaired, limited or restricted Mobility: Walking and Moving Around Goal Status [id number]): At least 1 percent but less than 20 percent impaired, limited or restricted   [facility] 09/29/2012, 5:44 PM

## 2012-09-29 NOTE — Plan of Care (Signed)
Problem: Consults Goal: Diagnosis - Spinal Surgery Outcome: Completed/Met Date Met:  09/29/12 Lumbar Laminectomy (Complex) with microdiscectomy

## 2012-09-29 NOTE — Evaluation (Signed)
Occupational Therapy Evaluation Patient Details Name: [name] MRN: [id number] DOB: Aug 29, 1956 Today's Date: 09/29/2012 Time: 2952-8413 OT Time Calculation (min): 25 min  OT Assessment / Plan / Recommendation Clinical Impression  This 56 year old man was admitted for 2 level decompression (L4-5; L5-S1).  He will benefit from continued OT to reinforce precautions and adls.  Goals in acute are for min guard level.      OT Assessment  Patient needs continued OT Services    Follow Up Recommendations  Home health OT (for iadls)    Barriers to Discharge      Equipment Recommendations  None recommended by OT;None recommended by PT    Recommendations for Other Services    Frequency  Min 2X/week    Precautions / Restrictions Precautions Precautions: Back Restrictions Weight Bearing Restrictions: No   Pertinent Vitals/Pain 8/10 back; repositioned and RN notified    ADL  Eating/Feeding: Simulated;Set up Where Assessed - Eating/Feeding: Chair Grooming: Simulated;Set up Where Assessed - Grooming: Supported sitting Upper Body Bathing: Simulated;Set up Where Assessed - Upper Body Bathing: Supported sitting Lower Body Bathing: Simulated;+2 Total assistance Lower Body Bathing: Patient Percentage: 80% (with AE) Where Assessed - Lower Body Bathing: Supported sit to stand Upper Body Dressing: Simulated;+2 Total assistance Upper Body Dressing: Patient Percentage: 80% Where Assessed - Upper Body Dressing: Supported sitting Lower Body Dressing: Simulated;+2 Total assistance Lower Body Dressing: Patient Percentage: 80% (with AE) Where Assessed - Lower Body Dressing: Supported sit to Pharmacist, hospital: Chief of Staff: Patient Percentage: 80% Statistician Method: Sit to Barista:  (to bed) Toileting - Architect and Hygiene: Simulated;+2 Total assistance Toileting - Architect and Hygiene: Patient  Percentage: 60% Where Assessed - Toileting Clothing Manipulation and Hygiene: Standing Transfers/Ambulation Related to ADLs: ambulated with PT.  Educated on sidestepping into tub when he is ready to do this.   ADL Comments: Pt has AE kit from TKA surgery.  Reviewed back precautions with ADLs:  could benefit from reinforcement.  Also educated on toilet aid and alternatives; resource given    OT Diagnosis: Generalized weakness  OT Problem List: Decreased strength;Decreased activity tolerance;Decreased knowledge of precautions;Obesity;Pain OT Treatment Interventions: Self-care/ADL training;DME and/or AE instruction;Patient/family education   OT Goals Acute Rehab OT Goals OT Goal Formulation: With patient Time For Goal Achievement: 10/06/12 Potential to Achieve Goals: Good ADL Goals Pt Will Perform Grooming: with supervision;Standing at sink ADL Goal: Grooming - Progress: Goal set today Pt Will Perform Lower Body Bathing: with min assist;Sit to stand from chair;with adaptive equipment ADL Goal: Lower Body Bathing - Progress: Goal set today Pt Will Perform Lower Body Dressing: with min assist;Sit to stand from chair;with adaptive equipment ADL Goal: Lower Body Dressing - Progress: Goal set today Pt Will Transfer to Toilet: with min assist;Ambulation;3-in-1;Maintaining back safety precautions (min guard) ADL Goal: Toilet Transfer - Progress: Goal set today  Visit Information  Last OT Received On: 09/29/12 Assistance Needed: +2 PT/OT Co-Evaluation/Treatment:  (overlapped)    Subjective Data  Subjective: I will have help for a couple of days Patient Stated Goal: Be independent; get pain controlled   Prior Functioning     Home Living Lives With: Alone Bathroom Shower/Tub: Tub/shower unit (grab bars) Bathroom Toilet: Standard (with 3:1) Home Adaptive Equipment: Bedside commode/3-in-1 (AE kit) Prior Function Level of Independence: Independent Communication Communication: No  difficulties         Vision/Perception     Cognition  Overall Cognitive Status: Appears within functional  limits for tasks assessed/performed Arousal/Alertness: Awake/alert Orientation Level: Appears intact for tasks assessed Behavior During Session: [facility] for tasks performed    Extremity/Trunk Assessment Right Upper Extremity Assessment RUE ROM/Strength/Tone: [facility] for tasks assessed Left Upper Extremity Assessment LUE ROM/Strength/Tone: [facility] for tasks assessed     Mobility       Shoulder Instructions     Exercise     Balance     End of Session OT - End of Session Activity Tolerance: Patient limited by pain Patient left: in bed;with call bell/phone within reach Nurse Communication: Patient requests pain meds  GO Functional Assessment Tool Used: clinical observation/judgment Functional Limitation: Self care Self Care Current Status (B1478): At least 20 percent but less than 40 percent impaired, limited or restricted Self Care Goal Status (G9562): At least 1 percent but less than 20 percent impaired, limited or restricted   [name] 09/29/2012, 3:47 PM Marica Otter, OTR/L [phone] 09/29/2012

## 2012-09-30 LAB — GLUCOSE, CAPILLARY
Glucose-Capillary: 150 mg/dL — ABNORMAL HIGH (ref 70–99)
Glucose-Capillary: 175 mg/dL — ABNORMAL HIGH (ref 70–99)

## 2012-09-30 MED ORDER — WARFARIN SODIUM 7.5 MG PO TABS
15.0000 mg | ORAL_TABLET | Freq: Once | ORAL | Status: AC
  Administered 2012-09-30: 15 mg via ORAL
  Filled 2012-09-30: qty 2

## 2012-09-30 NOTE — Progress Notes (Signed)
Subjective: Patient states he is doing well he is no shortness of breath chest pain in his back pain and left leg pain is improved   Objective: Vital signs in last 24 hours: Temp:  [98 F (36.7 C)-99.6 F (37.6 C)] 99 F (37.2 C) (10/26 0623) Pulse Rate:  [74-81] 74  (10/26 0623) Resp:  [10-16] 16  (10/26 0623) BP: (120-127)/(74-89) 120/76 mmHg (10/26 0623) SpO2:  [95 %-99 %] 97 % (10/26 0623)  Intake/Output from previous day: 10/25 0701 - 10/26 0700 In: 2798 [P.O.:962; I.V.:1636; IV Piggyback:200] Out: 1000 [Urine:1000] Intake/Output this shift:    No results found for this basename: HGB:5 in the last 72 hours No results found for this basename: WBC:2,RBC:2,HCT:2,PLT:2 in the last 72 hours No results found for this basename: NA:2,K:2,CL:2,CO2:2,BUN:2,CREATININE:2,GLUCOSE:2,CALCIUM:2 in the last 72 hours  Basename 09/30/12 0642  LABPT --  INR 1.06    Patient's conscious alert appropriate ears be very comfortable sitting up watching TV.Marland Kitchen He has intact light touch sensation lower extremities is able straight leg raise left is strong dorsi flexion at the ankle and knee extension calf is soft and nontender. Lung sounds were clear throughout heart regular rate and rhythm his abdomen was soft nontender. Lumbar dressing was intact without any 40 drainage  Assessment/Plan: Postop day #1 status post lumbar decompression and discectomy on the left doing well  Plan out of bed with physical therapy decrease IV hydration advance diet as tolerated and reevaluate tomorrow for further plans   Jamelle Rushing 09/30/2012, 8:27 AM

## 2012-09-30 NOTE — Progress Notes (Signed)
Occupational Therapy Treatment Patient Details Name: [name] MRN: [id number] DOB: 1956-04-25 Today's Date: 09/30/2012 Time: 1030-1106 OT Time Calculation (min): 36 min  OT Assessment / Plan / Recommendation Comments on Treatment Session Pt doing better than previous therapy on 10/25.  Still min guard assist for mobility related to selfcare tasks.  Also needs min assist for transtion from supine to sitting EOB while maintaining back precautions.  O2 sats 93% or greater on room air during session.    Follow Up Recommendations  Home health OT       Equipment Recommendations  None recommended by OT       Frequency Min 2X/week   Plan Discharge plan remains appropriate    Precautions / Restrictions Precautions Precautions: Back Restrictions Weight Bearing Restrictions: No   Pertinent Vitals/Pain Pt reported pain at minimal level in bed, increased in sitting to 6/10, meds given earlier    ADL  Grooming: Performed;Teeth care;Wash/dry hands;Supervision/safety Where Assessed - Grooming: Supported standing Lower Body Bathing: Performed;Supervision/safety;Other (comment) (washed peri area in the fron at the sink) Toilet Transfer: Simulated;Min Pension scheme manager Method: Surveyor, minerals: Other (comment) (To bedside chair, pt declined need to toilet.) Toileting - Clothing Manipulation and Hygiene: Simulated;Min guard Where Assessed - Glass blower/designer Manipulation and Hygiene: Other (comment) (Sit to stand from EOB) Transfers/Ambulation Related to ADLs: Pt is overall min guard assist with mobility using the RW. ADL Comments: Pt stood for 15 mins including mobility and grooming tasks at the sink, with min guard assist.  Still needs min assist for rolling in bed and transition sidelying to sit.  Reports being familiar with AE since he just had his knee surgery in Feb.  Positioned in the bedside chair at end of session.        OT Goals ADL Goals ADL Goal:  Grooming - Progress: Progressing toward goals ADL Goal: Toilet Transfer - Progress: Met  Visit Information  Last OT Received On: 09/30/12 Assistance Needed: +1    Subjective Data  Subjective: "I feel like I did a little better today." Patient Stated Goal: To do everything for himself since he will be alone most of the time.      Cognition  Overall Cognitive Status: Appears within functional limits for tasks assessed/performed Arousal/Alertness: Awake/alert Orientation Level: Appears intact for tasks assessed Behavior During Session: [facility] for tasks performed    Mobility  Shoulder Instructions Bed Mobility Bed Mobility: Rolling Left;Left Sidelying to Sit Rolling Left: 4: Min assist Left Sidelying to Sit: HOB flat;4: Min assist Transfers Transfers: Sit to Stand Sit to Stand: 4: Min guard;With upper extremity assist;From bed;Other (comment) (bed raised to height at home) Stand to Sit: 4: Min guard;Without upper extremity assist;To chair/3-in-1          Balance Balance Balance Assessed: Yes Static Standing Balance Static Standing - Balance Support: Left upper extremity supported Static Standing - Level of Assistance: 5: Stand by assistance Dynamic Standing Balance Dynamic Standing - Balance Support: Right upper extremity supported;Left upper extremity supported Dynamic Standing - Level of Assistance: 5: Stand by assistance;Other (comment) (with use of RW for mobility)   End of Session OT - End of Session Activity Tolerance: Patient tolerated treatment well Patient left: in chair;with call bell/phone within reach Nurse Communication: Mobility status     Leith Szafranski OTR/L Pager number [phone] 09/30/2012, 12:12 PM

## 2012-09-30 NOTE — Progress Notes (Signed)
ANTICOAGULATION CONSULT NOTE - Follow Up Pharmacy Consult for Coumadin Indication: h/o PE, now s/p lumbar laminectomy/decompression microdiscectomy   Allergies  Allergen Reactions  . Other     NO BLOOD -JEHOVAH'S WITNESS-SIGNED REFUSAL BUT WOULD TAKE ALBUMIN    Patient Measurements: Height: 6\' 2"  (188 cm) Weight: 370 lb (167.831 kg) IBW/kg (Calculated) : 82.2   Labs:  Basename 09/30/12 0642  HGB --  HCT --  PLT --  APTT --  LABPROT 13.7  INR 1.06  HEPARINUNFRC --  CREATININE --  CKTOTAL --  CKMB --  TROPONINI --    Estimated Creatinine Clearance: 139.5 ml/min (by C-G formula based on Cr of 0.95).   Medical History: Past Medical History  Diagnosis Date  . Diabetes mellitus     type II  . Hyperlipidemia   . Hypertension   . DVT, lower extremity     right  . Embolus 2005    pulmonary  . Depression   . Obesity   . BACK PAIN   . COLONIC POLYPS, HX OF   . DEGENERATIVE JOINT DISEASE, KNEES, BILATERAL   . DEPRESSION   . DIVERTICULOSIS, COLON   . HYPOGONADISM, MALE   . HYPOTENSION, ORTHOSTATIC   . OBESITY, MORBID   . Long term current use of anticoagulant   . Obstructive sleep apnea     uses cpap setting 8 to 10  . Kidney stones   . Renal insufficiency   . RENAL INSUFFICIENCY   . Shortness of breath     Assessment:  56 yom on Coumadin PTA for h/o PE, being followed by at [facility] Coumadin clinic. Per clinic note 9/30, pt was to be on Coumadin 11.5 mg on MWF and 10 mg other days. Per patient, he has been taking 11mg  on MWF and 10mg  other days. Pt's reported last Coumadin dose 10/17, stopping Coumadin for lumbar laminectomy and decompression microdiscectomy 10/24.  MD ordered to resume Coumadin on 10/25.   Patient received 15 mg dose last night.  INR low as expected today.  No CBC since 10/22.  No bleeding/complications reported.   Goal of Therapy:  INR 2-3 Monitor platelets by anticoagulation protocol: Yes   Plan:   Repeat Coumadin 15mg  po x 1  tonight  Daily PT/INR  Beyonka Pitney 09/30/2012,11:17 AM

## 2012-09-30 NOTE — Progress Notes (Signed)
Physical Therapy Treatment Patient Details Name: TROOPER OLANDER MRN: [id number] DOB: 1956/09/20 Today's Date: 09/30/2012 Time: 4098-1191 PT Time Calculation (min): 16 min  PT Assessment / Plan / Recommendation Comments on Treatment Session  Pt c/o poor sleep last night and alot pain with activity. Pt plans to D/C to home.    Follow Up Recommendations  Home health PT     Does the patient have the potential to tolerate intense rehabilitation     Barriers to Discharge        Equipment Recommendations  None recommended by OT    Recommendations for Other Services    Frequency 7X/week   Plan Discharge plan remains appropriate    Precautions / Restrictions Precautions Precautions: Back Restrictions Weight Bearing Restrictions: No    Pertinent Vitals/Pain C/o 8/10 during sit to stand 2/10 at rest    Mobility  Bed Mobility Bed Mobility: Not assessed Rolling Left: 4: Min assist Left Sidelying to Sit: HOB flat;4: Min assist Details for Bed Mobility Assistance: Pt OOB in recliner  Transfers Transfers: Sit to Stand;Stand to Sit Sit to Stand: 5: Supervision;From chair/3-in-1 Stand to Sit: 5: Supervision;To chair/3-in-1 Details for Transfer Assistance: increased time to transition with increased c/o pain during sit to stand to sit.  Once upright, pt feels better.  Ambulation/Gait Ambulation/Gait Assistance: 5: Supervision Ambulation Distance (Feet): 185 Feet Assistive device: Rolling walker Ambulation/Gait Assistance Details: <25% VC's on safety with turns and to avoid any twisting.  Lowest RA sat recorded was 84 however avg 87%.  Pt demon limited activity tolerance. Gait Pattern: Step-through pattern Gait velocity: decreased     PT Goals                       progressing    Visit Information  Last PT Received On: 09/30/12 Assistance Needed: +1    Subjective Data      Cognition  Overall Cognitive Status: Appears within functional limits for tasks  assessed/performed Arousal/Alertness: Awake/alert Orientation Level: Appears intact for tasks assessed Behavior During Session: [facility] for tasks performed    Balance  Balance Balance Assessed: Yes Static Standing Balance Static Standing - Balance Support: Left upper extremity supported Static Standing - Level of Assistance: 5: Stand by assistance Dynamic Standing Balance Dynamic Standing - Balance Support: Right upper extremity supported;Left upper extremity supported Dynamic Standing - Level of Assistance: 5: Stand by assistance;Other (comment) (with use of RW for mobility)  End of Session PT - End of Session Equipment Utilized During Treatment: Gait belt Activity Tolerance: Patient tolerated treatment well Patient left: in chair;with call bell/phone within reach  Felecia Shelling  PTA [facility]  Acute  Rehab Pager     [phone]

## 2012-10-01 LAB — PROTIME-INR
INR: 1.1 (ref 0.00–1.49)
Prothrombin Time: 14.1 seconds (ref 11.6–15.2)

## 2012-10-01 LAB — GLUCOSE, CAPILLARY

## 2012-10-01 MED ORDER — WARFARIN SODIUM 2.5 MG PO TABS
12.5000 mg | ORAL_TABLET | Freq: Once | ORAL | Status: AC
  Administered 2012-10-01: 12.5 mg via ORAL
  Filled 2012-10-01: qty 1

## 2012-10-01 MED ORDER — METHOCARBAMOL 500 MG PO TABS: 500.0000 mg | ORAL_TABLET | Freq: Four times a day (QID) | ORAL | Status: DC | PRN

## 2012-10-01 MED ORDER — HYDROCODONE-ACETAMINOPHEN 5-325 MG PO TABS: 1.0000 | ORAL_TABLET | Freq: Four times a day (QID) | ORAL | Status: DC | PRN

## 2012-10-01 NOTE — Progress Notes (Signed)
Physical Therapy Treatment Patient Details Name: [name] MRN: [id number] DOB: 17-Feb-1956 Today's Date: 10/01/2012 Time: 1002-1026 PT Time Calculation (min): 24 min  PT Assessment / Plan / Recommendation Comments on Treatment Session  Pt progressing well and demonstrating good comprehension of back precautions and post op limitations    Follow Up Recommendations  Home health PT     Does the patient have the potential to tolerate intense rehabilitation     Barriers to Discharge        Equipment Recommendations  None recommended by OT    Recommendations for Other Services OT consult  Frequency 7X/week   Plan Discharge plan remains appropriate    Precautions / Restrictions Precautions Precautions: Back Precaution Comments: Pt able to recall all back precautions Restrictions Weight Bearing Restrictions: No   Pertinent Vitals/Pain 3-4/10    Mobility  Bed Mobility Bed Mobility: Supine to Sit Rolling Left: 5: Supervision Left Sidelying to Sit: 5: Supervision;HOB flat;With rails (Pt states he has night stand to assist at home) Transfers Transfers: Sit to Stand;Stand to Sit Sit to Stand: 5: Supervision;With upper extremity assist;From bed Stand to Sit: 5: Supervision;To chair/3-in-1 Details for Transfer Assistance: increased time to transition with increased c/o pain during sit to stand to sit.  Once upright, pt feels better. Ambulation/Gait Ambulation/Gait Assistance: 5: Supervision Ambulation Distance (Feet): 200 Feet Assistive device: Rolling walker Ambulation/Gait Assistance Details: min cues for pacing, posture, and position from RW Gait Pattern: Step-through pattern Gait velocity: decreased Stairs: Yes Stairs Assistance: 4: Min assist Stairs Assistance Details (indicate cue type and reason): min cues for sequence Stair Management Technique: One rail Left;Forwards Number of Stairs: 2     Exercises     PT Diagnosis:    PT Problem List:   PT Treatment  Interventions:     PT Goals Acute Rehab PT Goals PT Goal Formulation: With patient Time For Goal Achievement: 10/03/12 Potential to Achieve Goals: Good Pt will go Supine/Side to Sit: with supervision PT Goal: Supine/Side to Sit - Progress: Met Pt will go Sit to Supine/Side: with supervision PT Goal: Sit to Supine/Side - Progress: Progressing toward goal Pt will go Sit to Stand: with supervision PT Goal: Sit to Stand - Progress: Met Pt will go Stand to Sit: with supervision PT Goal: Stand to Sit - Progress: Met Pt will Ambulate: >150 feet;with supervision;with rolling walker PT Goal: Ambulate - Progress: Met Pt will Go Up / Down Stairs: 3-5 stairs;with min assist;with least restrictive assistive device PT Goal: Up/Down Stairs - Progress: Progressing toward goal  Visit Information  Last PT Received On: 10/01/12 Assistance Needed: +1    Subjective Data  Subjective: I'm going home today Patient Stated Goal: Resume previous lifestyle with decreased pain   Cognition  Overall Cognitive Status: Appears within functional limits for tasks assessed/performed Arousal/Alertness: Awake/alert Orientation Level: Appears intact for tasks assessed Behavior During Session: [facility] for tasks performed    Balance     End of Session PT - End of Session Equipment Utilized During Treatment: Gait belt Activity Tolerance: Patient tolerated treatment well Patient left: in chair;with call bell/phone within reach Nurse Communication: Mobility status   GP     [name] 10/01/2012, 12:25 PM

## 2012-10-01 NOTE — Progress Notes (Signed)
   Subjective: 3 Days Post-Op Procedure(s) (LRB): LUMBAR LAMINECTOMY/DECOMPRESSION MICRODISCECTOMY 2 LEVELS (Left) Patient reports pain as mild.   Patient seen in rounds with Dr. Darrelyn Hillock. Patient is well, and has had no acute complaints or problems. He reports that he is doing well. Voiding well. No complaints of shortness of breath or chest pain. No issues overnight. He feels ready to go home today.    Objective: Vital signs in last 24 hours: Temp:  [98 F (36.7 C)-98.8 F (37.1 C)] 98.8 F (37.1 C) (10/27 0430) Pulse Rate:  [79-85] 80  (10/27 0430) Resp:  [16-20] 20  (10/27 0430) BP: (116-125)/(76-86) 116/76 mmHg (10/27 0430) SpO2:  [92 %-98 %] 92 % (10/27 0430)  Intake/Output from previous day:  Intake/Output Summary (Last 24 hours) at 10/01/12 0752 Last data filed at 10/01/12 0430  Gross per 24 hour  Intake   1050 ml  Output   1201 ml  Net   -151 ml      Basename 10/01/12 0434 09/30/12 0642  LABPT -- --  INR 1.10 1.06    EXAM General - Patient is Alert and Oriented Extremity - Neurologically intact Neurovascular intact Dorsiflexion/Plantar flexion intact Dressing/Incision - clean, dry, no drainage Motor Function - intact, moving foot and toes well on exam.   Past Medical History  Diagnosis Date  . Diabetes mellitus     type II  . Hyperlipidemia   . Hypertension   . DVT, lower extremity     right  . Embolus 2005    pulmonary  . Depression   . Obesity   . BACK PAIN   . COLONIC POLYPS, HX OF   . DEGENERATIVE JOINT DISEASE, KNEES, BILATERAL   . DEPRESSION   . DIVERTICULOSIS, COLON   . HYPOGONADISM, MALE   . HYPOTENSION, ORTHOSTATIC   . OBESITY, MORBID   . Long term current use of anticoagulant   . Obstructive sleep apnea     uses cpap setting 8 to 10  . Kidney stones   . Renal insufficiency   . RENAL INSUFFICIENCY   . Shortness of breath     Assessment/Plan: 3 Days Post-Op Procedure(s) (LRB): LUMBAR LAMINECTOMY/DECOMPRESSION MICRODISCECTOMY 2  LEVELS (Left) Active Problems:  * No active hospital problems. *   Estimated Body mass index is 47.51 kg/(m^2) as calculated from the following:   Height as of this encounter: 6\' 2" (1.88 m).   Weight as of this encounter: 370 lb(167.831 kg).  Advance diet Up with therapy D/C IV fluids Discharge home today after dressing changes  DVT Prophylaxis - Coumadin Weight-Bearing as tolerated  Follow up in office with Dr. Simonne Come in two weeks  [name] [name] 10/01/2012, 7:52 AM

## 2012-10-01 NOTE — Progress Notes (Signed)
ANTICOAGULATION CONSULT NOTE - Follow Up Pharmacy Consult for Coumadin Indication: h/o PE, now s/p lumbar laminectomy/decompression microdiscectomy   Allergies  Allergen Reactions  . Other     NO BLOOD -JEHOVAH'S WITNESS-SIGNED REFUSAL BUT WOULD TAKE ALBUMIN    Patient Measurements: Height: 6\' 2"  (188 cm) Weight: 370 lb (167.831 kg) IBW/kg (Calculated) : 82.2   Labs:  Basename 10/01/12 0434 09/30/12 0642  HGB -- --  HCT -- --  PLT -- --  APTT -- --  LABPROT 14.1 13.7  INR 1.10 1.06  HEPARINUNFRC -- --  CREATININE -- --  CKTOTAL -- --  CKMB -- --  TROPONINI -- --    Estimated Creatinine Clearance: 139.5 ml/min (by C-G formula based on Cr of 0.95).   Medical History: Past Medical History  Diagnosis Date  . Diabetes mellitus     type II  . Hyperlipidemia   . Hypertension   . DVT, lower extremity     right  . Embolus 2005    pulmonary  . Depression   . Obesity   . BACK PAIN   . COLONIC POLYPS, HX OF   . DEGENERATIVE JOINT DISEASE, KNEES, BILATERAL   . DEPRESSION   . DIVERTICULOSIS, COLON   . HYPOGONADISM, MALE   . HYPOTENSION, ORTHOSTATIC   . OBESITY, MORBID   . Long term current use of anticoagulant   . Obstructive sleep apnea     uses cpap setting 8 to 10  . Kidney stones   . Renal insufficiency   . RENAL INSUFFICIENCY   . Shortness of breath     Assessment:  56 yom on Coumadin PTA for h/o PE, being followed by at [facility] Coumadin clinic. Per clinic note 9/30, pt was to be on Coumadin 11.5 mg on MWF and 10 mg other days. Per patient, he has been taking 11mg  on MWF and 10mg  other days. Pt's reported last Coumadin dose 10/17, stopping Coumadin for lumbar laminectomy and decompression microdiscectomy 10/24.  MD ordered to resume Coumadin on 10/25.   Patient received 15 mg x 2 days.  INR low as expected with re-initiation.  No CBC since 10/22.  No bleeding/complications reported.   Patient being discharged today.  Goal of Therapy:  INR  2-3 Monitor platelets by anticoagulation protocol: Yes   Plan:   Will provide patient with 12.5 mg dose today at 1100 prior to discharge then patient is to resume home dosing of warfarin.  Clance Boll 10/01/2012,9:27 AM

## 2012-10-02 NOTE — Progress Notes (Signed)
CARE MANAGEMENT NOTE 10/02/2012  Patient:  [name], [name]   Account Number:  [id number]  Date Initiated:  09/29/2012  Documentation initiated by:  Colleen Can  Subjective/Objective Assessment:   DX HERNIATED DISC; MICRO-DISSECTOMY WITH DECOMPRESSION LAMINECTOMY     Action/Plan:   CM SPOKE WITH PATIENT. PLANS ARE FOR PATIENT TO RETURN TO HIS HOME IN [place] WHERE SPOUSE WILL BE CAREGIVER. HE ALREADY HAS RW  WILL NEED HHPT   Anticipated DC Date:  10/01/2012   Anticipated DC Plan:  HOME W HOME HEALTH SERVICES  In-house referral  Clinical Social Worker      DC Associate Professor  CM consult      [facility] Choice  HOME HEALTH   Choice offered to / List presented to:  C-1 Patient   DME arranged  NA      DME agency  NA     HH arranged  HH-2 PT      [facility] agency  [employer]   Status of service:  Completed, signed off Medicare Important Message given?  NO (If response is "NO", the following Medicare IM given date fields will be blank) Date Medicare IM given:   Date Additional Medicare IM given:    Discharge Disposition:  HOME W HOME HEALTH SERVICES  Per UR Regulation:  Reviewed for med. necessity/level of care/duration of stay  Comments:  10/27 14:00 debbie dowell rn,bsn orders given to [employer] rep.

## 2012-10-14 NOTE — Discharge Summary (Signed)
NAMEKENN, REKOWSKI               ACCOUNT NO.:  [id number]  MEDICAL RECORD NO.:  [id number]  LOCATION:  1609                         FACILITY:  [facility]  PHYSICIAN:  Marlowe Kays, M.D.  DATE OF BIRTH:  1956-03-30  DATE OF ADMISSION:  09/28/2012 DATE OF DISCHARGE:  10/01/2012                              DISCHARGE SUMMARY   ADMITTING DIAGNOSES: 1. Herniated nucleus pulposus at L4-5 and L4-S1, left. 2. Morbid obesity. 3. Diabetes mellitus. 4. History of pulmonary embolism.  DISCHARGE DIAGNOSES: 1. Herniated nucleus pulposus at L4-5 and L4-S1, left. 2. Morbid obesity. 3. Diabetes mellitus. 4. History of pulmonary embolism.  OPERATION:  Microdiskectomy, L4-5 and L4-S1, left on September 28, 2012.  SUMMARY:  I followed Mr. Engelson for some time because of severe unrelenting left lumbar radiculopathy.  An MRI has demonstrated the above diagnoses.  Because I considered him to be a high-risk operative patient, he was tried on various medications and epidural steroid injection without success in relieving his symptomatology.  After obtaining medical clearance and with him understanding full well that he was a high risk patient, he was admitted for the above-mentioned surgery, which went uneventfully.  Postoperatively, he had relief of his preoperative symptoms.  He was slowly in ambulating because of his obesity and pain control.  At the time of discharge, he was ambulatory, afebrile and wound was healing satisfactorily.  He was discharged reasonably comfortable with the wound healing nicely on Percocet and Robaxin with instructions to return to see me in 1 week in my office.  CONDITION AT DISCHARGE:  Stable and improved.          ______________________________ Marlowe Kays, M.D.     JA/MEDQ  D:  10/13/2012  T:  10/14/2012  Job:  [id number]

## 2012-10-30 ENCOUNTER — Ambulatory Visit (INDEPENDENT_AMBULATORY_CARE_PROVIDER_SITE_OTHER): Payer: BC Managed Care – PPO | Admitting: General Practice

## 2012-10-30 DIAGNOSIS — Z23 Encounter for immunization: Secondary | ICD-10-CM

## 2012-10-30 DIAGNOSIS — I2699 Other pulmonary embolism without acute cor pulmonale: Secondary | ICD-10-CM

## 2012-10-30 LAB — POCT INR: INR: 3.7

## 2012-11-09 ENCOUNTER — Ambulatory Visit (INDEPENDENT_AMBULATORY_CARE_PROVIDER_SITE_OTHER): Payer: BC Managed Care – PPO | Admitting: General Practice

## 2012-11-09 DIAGNOSIS — I2699 Other pulmonary embolism without acute cor pulmonale: Secondary | ICD-10-CM

## 2012-11-09 LAB — POCT INR: INR: 2.6

## 2012-11-16 ENCOUNTER — Other Ambulatory Visit: Payer: Self-pay | Admitting: Internal Medicine

## 2012-12-12 ENCOUNTER — Ambulatory Visit (INDEPENDENT_AMBULATORY_CARE_PROVIDER_SITE_OTHER): Payer: BC Managed Care – PPO | Admitting: General Practice

## 2012-12-12 DIAGNOSIS — I2699 Other pulmonary embolism without acute cor pulmonale: Secondary | ICD-10-CM

## 2012-12-12 LAB — POCT INR: INR: 3.9

## 2012-12-13 ENCOUNTER — Ambulatory Visit: Payer: Self-pay | Admitting: Internal Medicine

## 2012-12-13 DIAGNOSIS — Z0289 Encounter for other administrative examinations: Secondary | ICD-10-CM

## 2012-12-21 ENCOUNTER — Other Ambulatory Visit: Payer: Self-pay | Admitting: Internal Medicine

## 2013-01-01 ENCOUNTER — Encounter: Payer: Self-pay | Admitting: Internal Medicine

## 2013-01-01 ENCOUNTER — Other Ambulatory Visit: Payer: Self-pay | Admitting: General Practice

## 2013-01-01 ENCOUNTER — Other Ambulatory Visit (INDEPENDENT_AMBULATORY_CARE_PROVIDER_SITE_OTHER): Payer: BC Managed Care – PPO

## 2013-01-01 ENCOUNTER — Ambulatory Visit (INDEPENDENT_AMBULATORY_CARE_PROVIDER_SITE_OTHER): Payer: BC Managed Care – PPO | Admitting: Internal Medicine

## 2013-01-01 ENCOUNTER — Other Ambulatory Visit: Payer: Self-pay | Admitting: Internal Medicine

## 2013-01-01 VITALS — BP 132/90 | HR 81 | Temp 97.5°F | Ht 74.0 in | Wt 375.1 lb

## 2013-01-01 DIAGNOSIS — IMO0001 Reserved for inherently not codable concepts without codable children: Secondary | ICD-10-CM

## 2013-01-01 DIAGNOSIS — N489 Disorder of penis, unspecified: Secondary | ICD-10-CM

## 2013-01-01 DIAGNOSIS — E785 Hyperlipidemia, unspecified: Secondary | ICD-10-CM

## 2013-01-01 DIAGNOSIS — N4889 Other specified disorders of penis: Secondary | ICD-10-CM

## 2013-01-01 DIAGNOSIS — I10 Essential (primary) hypertension: Secondary | ICD-10-CM

## 2013-01-01 DIAGNOSIS — E119 Type 2 diabetes mellitus without complications: Secondary | ICD-10-CM

## 2013-01-01 DIAGNOSIS — Z0000 Encounter for general adult medical examination without abnormal findings: Secondary | ICD-10-CM

## 2013-01-01 LAB — HEPATIC FUNCTION PANEL
Bilirubin, Direct: 0.1 mg/dL (ref 0.0–0.3)
Total Bilirubin: 0.5 mg/dL (ref 0.3–1.2)

## 2013-01-01 LAB — LIPID PANEL
Cholesterol: 112 mg/dL (ref 0–200)
LDL Cholesterol: 54 mg/dL (ref 0–99)
Total CHOL/HDL Ratio: 3
Triglycerides: 114 mg/dL (ref 0.0–149.0)
VLDL: 22.8 mg/dL (ref 0.0–40.0)

## 2013-01-01 LAB — BASIC METABOLIC PANEL
BUN: 20 mg/dL (ref 6–23)
Calcium: 9.3 mg/dL (ref 8.4–10.5)
Creatinine, Ser: 1 mg/dL (ref 0.4–1.5)
GFR: 97.22 mL/min (ref >60.00)

## 2013-01-01 MED ORDER — INSULIN GLARGINE 100 UNIT/ML ~~LOC~~ SOLN: 75.0000 [IU] | Freq: Every day | SUBCUTANEOUS | Status: DC

## 2013-01-01 MED ORDER — WARFARIN SODIUM 1 MG PO TABS: 1.0000 mg | ORAL_TABLET | ORAL | Status: DC

## 2013-01-01 MED ORDER — DOXYCYCLINE HYCLATE 100 MG PO TABS: 100.0000 mg | ORAL_TABLET | Freq: Two times a day (BID) | ORAL | Status: DC

## 2013-01-01 MED ORDER — HYDROCHLOROTHIAZIDE 25 MG PO TABS: 25.0000 mg | ORAL_TABLET | Freq: Every morning | ORAL | Status: DC

## 2013-01-01 MED ORDER — GLIPIZIDE 10 MG PO TABS: 10.0000 mg | ORAL_TABLET | Freq: Two times a day (BID) | ORAL | Status: DC

## 2013-01-01 MED ORDER — EZETIMIBE 10 MG PO TABS: 10.0000 mg | ORAL_TABLET | Freq: Every day | ORAL | Status: DC

## 2013-01-01 MED ORDER — LISINOPRIL 5 MG PO TABS: 5.0000 mg | ORAL_TABLET | Freq: Every morning | ORAL | Status: DC

## 2013-01-01 MED ORDER — WARFARIN SODIUM 5 MG PO TABS: 5.0000 mg | ORAL_TABLET | Freq: Every day | ORAL | Status: DC

## 2013-01-01 MED ORDER — PHENTERMINE HCL 37.5 MG PO CAPS: 37.5000 mg | ORAL_CAPSULE | ORAL | Status: DC

## 2013-01-01 NOTE — Progress Notes (Signed)
Subjective:    Patient ID: [name], male    DOB: July 14, 1956, 57 y.o.   MRN: [id number]  HPI  Here to f/u; overall doing ok,  Pt denies chest pain, increased sob or doe, wheezing, orthopnea, PND, increased LE swelling, palpitations, dizziness or syncope.  Pt denies polydipsia, polyuria, or low sugar symptoms such as weakness or confusion improved with po intake.  Pt denies new neurological symptoms such as new headache, or facial or extremity weakness or numbness.   Pt states overall good compliance with meds, has been trying to follow lower cholesterol, diabetic diet, with wt overall stable,  but little exercise however now.  CBG's have been 200-300's for the most part, and has tried to increase his insuling a few units occasionally but still high, has been that way since more sedentary since the lumbar surgury oct 2013 (per Dr Applington/ortho), pain overall much improved, using muscle relaxer and hydrocodone, gradually trying to increase your activity.  Wt has gone up from 363 to 375, but plans to do better with activity very soon. Taking 68 units now lantus in the AM. Also s/p circumcision a few yrs ago, but recently with sensation of "piece of metal" under the skin at a surgical sit, and not more red/tender/swollen for 1 wk.   Pt denies fever, wt loss, night sweats, loss of appetite, or other constitutional symptoms  Denies urinary symptoms such as dysuria, frequency, urgency, flank pain, hematuria or n/v, fever, chills.  Needs mult med refills today Past Medical History  Diagnosis Date  . Diabetes mellitus     type II  . Hyperlipidemia   . Hypertension   . DVT, lower extremity     right  . Embolus 2005    pulmonary  . Depression   . Obesity   . BACK PAIN   . COLONIC POLYPS, HX OF   . DEGENERATIVE JOINT DISEASE, KNEES, BILATERAL   . DEPRESSION   . DIVERTICULOSIS, COLON   . HYPOGONADISM, MALE   . HYPOTENSION, ORTHOSTATIC   . OBESITY, MORBID   . Long term current use of  anticoagulant   . Obstructive sleep apnea     uses cpap setting 8 to 10  . Kidney stones   . Renal insufficiency   . RENAL INSUFFICIENCY   . Shortness of breath    Past Surgical History  Procedure Date  . Knee surgury 2000 & 2003     cartilage damage  . Ankle surgury 2001 bilateral    with bone spurs and torn tendon  . Edg 2008    chronic Duodenitis  . Tonsillectomy and adenoidectomy age 68 or 64  . Total knee arthroplasty 01/10/2012    Procedure: TOTAL KNEE ARTHROPLASTY;  Surgeon: Shelda Pal, MD;  Location: [facility];  Service: Orthopedics;  Laterality: Left;  . Lumbar laminectomy/decompression microdiscectomy 09/28/2012    Procedure: LUMBAR LAMINECTOMY/DECOMPRESSION MICRODISCECTOMY 2 LEVELS;  Surgeon: Drucilla Schmidt, MD;  Location: [facility];  Service: Orthopedics;  Laterality: Left;  Decompressive laminectomy L4-L5. Microdiscectomy L5-S1    reports that he quit smoking about 34 years ago. He does not have any smokeless tobacco history on file. He reports that he drinks alcohol. He reports that he does not use illicit drugs. family history includes Diabetes in his brother and sister; Hypertension in his mother; and Prostate cancer in his father. Allergies  Allergen Reactions  . Other     NO BLOOD -JEHOVAH'S WITNESS-SIGNED REFUSAL BUT WOULD TAKE ALBUMIN   Current Outpatient Prescriptions  on File Prior to Visit  Medication Sig Dispense Refill  . albuterol (PROAIR HFA) 108 (90 BASE) MCG/ACT inhaler Inhale 2 puffs into the lungs every 4 (four) hours as needed. For shortness of breath      . aspirin (ECOTRIN LOW STRENGTH) 81 MG EC tablet Take 81 mg by mouth daily before breakfast.       . aspirin EC 81 MG tablet Take 81 mg by mouth every morning.      . CYMBALTA 60 MG capsule TAKE ONE CAPSULE BY MOUTH EVERY DAY  90 capsule  1  . HYDROcodone-acetaminophen (NORCO/VICODIN) 5-325 MG per tablet Take 1-2 tablets by mouth every 6 (six) hours as needed. For pain  60 tablet  0  . insulin  glargine (LANTUS SOLOSTAR) 100 UNIT/ML injection Inject 75 Units into the skin at bedtime.  10 mL  11  . metFORMIN (GLUCOPHAGE) 1000 MG tablet Take 1,000 mg by mouth 2 (two) times daily with a meal.      . methocarbamol (ROBAXIN) 500 MG tablet Take 1 tablet (500 mg total) by mouth every 6 (six) hours as needed.  40 tablet  0  . simvastatin (ZOCOR) 80 MG tablet TAKE 1 TABLET BY MOUTH ONCE DAILY  90 tablet  3  . phentermine 37.5 MG capsule Take 1 capsule (37.5 mg total) by mouth every morning.  30 capsule  2   Review of Systems  Constitutional: Negative for unexpected weight change, or unusual diaphoresis  HENT: Negative for tinnitus.   Eyes: Negative for photophobia and visual disturbance.  Respiratory: Negative for choking and stridor.   Gastrointestinal: Negative for vomiting and blood in stool.  Genitourinary: Negative for hematuria and decreased urine volume.  Musculoskeletal: Negative for acute joint swelling Skin: Negative for color change and wound.  Neurological: Negative for tremors and numbness other than noted  Psychiatric/Behavioral: Negative for decreased concentration or  hyperactivity.       Objective:   Physical Exam BP 132/90  Pulse 81  Temp 97.5 F (36.4 C) (Oral)  Ht 6\' 2"  (1.88 m)  Wt 375 lb 2 oz (170.156 kg)  BMI 48.16 kg/m2  SpO2 91% VS noted, not ill appaering Constitutional: Pt appears well-developed and well-nourished. /morbid obese HENT: Head: NCAT.  Right Ear: External ear normal.  Left Ear: External ear normal.  Eyes: Conjunctivae and EOM are normal. Pupils are equal, round, and reactive to light.  Neck: Normal range of motion. Neck supple.  Cardiovascular: Normal rate and regular rhythm.   Pulmonary/Chest: Effort normal and breath sounds normal.  - no rales or wheezing Left knee mild warm, effusion noted Neurological: Pt is alert. Not confused, motor/gait intact  Skin: Skin is warm. No erythema. Trace LE edema  Penis with approx 10 mm subq  cyst/nodular like lesion to frenular area post glans penis, mild tender, some ?pustular material able to be expressed Psychiatric: Pt behavior is normal. Thought content normal.     Assessment & Plan:

## 2013-01-01 NOTE — Assessment & Plan Note (Signed)
Ok for limited rx phentermine 37.5 qd ,  to f/u any worsening symptoms or concerns

## 2013-01-01 NOTE — Assessment & Plan Note (Signed)
Etiology unclear, ? Infectious vs cystic vs other- for antibx trial, consider urology if not improved for possible removal

## 2013-01-01 NOTE — Assessment & Plan Note (Signed)
stable overall by history and exam, recent data reviewed with pt, and pt to continue medical treatment as before,  to f/u any worsening symptoms or concerns Lab Results  Component Value Date   LDLCALC 85 06/14/2012

## 2013-01-01 NOTE — Assessment & Plan Note (Signed)
Uncontrolled, ok to increase the lantus to 75 units from the 68, watch for low sugars, cont to monitor cbg's Lab Results  Component Value Date   HGBA1C 7.8* 06/14/2012

## 2013-01-01 NOTE — Patient Instructions (Addendum)
OK to increase the lantus to 75 units per day Please take all new medication as prescribed - the antibiotic Please call if you are not improved in less than 1 wk, as you would need referral to urology Please continue all other medications as before, and refills have been done if requested. The coumadin should be refilled at the coumadin clinic Please take all new medication as prescribed - the phentermine Bailey Mech is at another office today; please return tomorrow as planned for your coumadin check Please go to the LAB in the Basement (turn left off the elevator) for the tests to be done today You will be contacted by phone if any changes need to be made immediately.  Otherwise, you will receive a letter about your results with an explanation, but please check with MyChart first. Thank you for enrolling in MyChart. Please follow the instructions below to securely access your online medical record. MyChart allows you to send messages to your doctor, view your test results, renew your prescriptions, schedule appointments, and more. To Log into My Chart online, please go by [employer] or [employer] to [email or web address], or download the MyChart App from the [employer] of [employer] .  Your Username is: [email or web address] (pass Pensions consultant) Please send a practice Message on Mychart later today. Please return in 6 months, or sooner if needed, with Lab testing done 3-5 days before

## 2013-01-01 NOTE — Assessment & Plan Note (Signed)
stable overall by history and exam, recent data reviewed with pt, and pt to continue medical treatment as before,  to f/u any worsening symptoms or concerns BP Readings from Last 3 Encounters:  01/01/13 132/90  10/01/12 116/76  10/01/12 116/76

## 2013-01-02 ENCOUNTER — Ambulatory Visit (INDEPENDENT_AMBULATORY_CARE_PROVIDER_SITE_OTHER): Payer: BC Managed Care – PPO | Admitting: General Practice

## 2013-01-02 ENCOUNTER — Telehealth: Payer: Self-pay | Admitting: Internal Medicine

## 2013-01-02 DIAGNOSIS — IMO0001 Reserved for inherently not codable concepts without codable children: Secondary | ICD-10-CM

## 2013-01-02 DIAGNOSIS — I2699 Other pulmonary embolism without acute cor pulmonale: Secondary | ICD-10-CM

## 2013-01-02 DIAGNOSIS — F22 Delusional disorders: Secondary | ICD-10-CM

## 2013-01-02 LAB — POCT INR: INR: 2.1

## 2013-01-02 NOTE — Telephone Encounter (Signed)
Referral done

## 2013-01-02 NOTE — Telephone Encounter (Signed)
Message copied by Corwin Levins on Tue Jan 02, 2013 12:52 PM ------      Message from: Scharlene Gloss B      Created: Tue Jan 02, 2013  8:53 AM       The patient went to Dr. Leslie Dales before and would like his referral back to him please for endo.

## 2013-01-04 ENCOUNTER — Encounter: Payer: Self-pay | Admitting: Internal Medicine

## 2013-01-12 ENCOUNTER — Encounter: Payer: Self-pay | Admitting: Internal Medicine

## 2013-01-30 ENCOUNTER — Other Ambulatory Visit: Payer: Self-pay | Admitting: Internal Medicine

## 2013-01-30 ENCOUNTER — Ambulatory Visit (INDEPENDENT_AMBULATORY_CARE_PROVIDER_SITE_OTHER): Payer: BC Managed Care – PPO | Admitting: General Practice

## 2013-01-30 DIAGNOSIS — I2699 Other pulmonary embolism without acute cor pulmonale: Secondary | ICD-10-CM

## 2013-01-30 DIAGNOSIS — IMO0001 Reserved for inherently not codable concepts without codable children: Secondary | ICD-10-CM

## 2013-01-30 LAB — POCT INR: INR: 2.8

## 2013-02-06 ENCOUNTER — Encounter: Payer: Self-pay | Admitting: Endocrinology

## 2013-02-06 ENCOUNTER — Ambulatory Visit (INDEPENDENT_AMBULATORY_CARE_PROVIDER_SITE_OTHER): Payer: BC Managed Care – PPO | Admitting: Endocrinology

## 2013-02-06 VITALS — BP 130/70 | HR 88 | Wt 376.0 lb

## 2013-02-06 DIAGNOSIS — E119 Type 2 diabetes mellitus without complications: Secondary | ICD-10-CM

## 2013-02-06 NOTE — Progress Notes (Signed)
Subjective:    Patient ID: [name], male    DOB: 1956-01-07, 57 y.o.   MRN: [id number]  HPI pt states 18 years h/o dm.  it is complicated by renal insufficiency and autonomic neuropathy.  he has been on insulin x approx 1 year.  pt says his diet is :moderate," and exercise is limited by health probs. Pt states few years of moderate arthralgias, throughout the body, and assoc dizziness.  Pt says cbg's vary from 120-249.  It is in general higher as the day goes on. Past Medical History  Diagnosis Date  . Diabetes mellitus     type II  . Hyperlipidemia   . Hypertension   . DVT, lower extremity     right  . Embolus 2005    pulmonary  . Depression   . Obesity   . BACK PAIN   . COLONIC POLYPS, HX OF   . DEGENERATIVE JOINT DISEASE, KNEES, BILATERAL   . DEPRESSION   . DIVERTICULOSIS, COLON   . HYPOGONADISM, MALE   . HYPOTENSION, ORTHOSTATIC   . OBESITY, MORBID   . Long term current use of anticoagulant   . Obstructive sleep apnea     uses cpap setting 8 to 10  . Kidney stones   . Renal insufficiency   . RENAL INSUFFICIENCY   . Shortness of breath     Past Surgical History  Procedure Laterality Date  . Knee surgury  2000 & 2003     cartilage damage  . Ankle surgury  2001 bilateral    with bone spurs and torn tendon  . Edg  2008    chronic Duodenitis  . Tonsillectomy and adenoidectomy  age 48 or 45  . Total knee arthroplasty  01/10/2012    Procedure: TOTAL KNEE ARTHROPLASTY;  Surgeon: Shelda Pal, MD;  Location: [facility];  Service: Orthopedics;  Laterality: Left;  . Lumbar laminectomy/decompression microdiscectomy  09/28/2012    Procedure: LUMBAR LAMINECTOMY/DECOMPRESSION MICRODISCECTOMY 2 LEVELS;  Surgeon: Drucilla Schmidt, MD;  Location: [facility];  Service: Orthopedics;  Laterality: Left;  Decompressive laminectomy L4-L5. Microdiscectomy L5-S1  . Heel spur surgery Bilateral 2005  . Replacement total knee  2013    History   Social History  . Marital Status: Single     Spouse Name: N/A    Number of Children: 2  . Years of Education: N/A   Occupational History  . back at work at [employer] since Jan. 2010    Social History Main Topics  . Smoking status: Former Smoker -- 0.25 packs/day for 5 years    Quit date: 12/06/1978  . Smokeless tobacco: Not on file  . Alcohol Use: Yes     Comment: occasional  . Drug Use: No  . Sexually Active: Not on file   Other Topics Concern  . Not on file   Social History Narrative   Daily caffeine use one per day    Current Outpatient Prescriptions on File Prior to Visit  Medication Sig Dispense Refill  . albuterol (PROAIR HFA) 108 (90 BASE) MCG/ACT inhaler Inhale 2 puffs into the lungs every 4 (four) hours as needed. For shortness of breath      . aspirin (ECOTRIN LOW STRENGTH) 81 MG EC tablet Take 81 mg by mouth daily before breakfast.       . aspirin EC 81 MG tablet Take 81 mg by mouth every morning.      . CYMBALTA 60 MG capsule TAKE ONE CAPSULE BY MOUTH  EVERY DAY  90 capsule  1  . doxycycline (VIBRA-TABS) 100 MG tablet Take 1 tablet (100 mg total) by mouth 2 (two) times daily.  20 tablet  0  . ezetimibe (ZETIA) 10 MG tablet Take 1 tablet (10 mg total) by mouth at bedtime.  90 tablet  3  . glipiZIDE (GLUCOTROL) 10 MG tablet Take 1 tablet (10 mg total) by mouth 2 (two) times daily before a meal.  180 tablet  3  . hydrochlorothiazide (HYDRODIURIL) 25 MG tablet Take 1 tablet (25 mg total) by mouth every morning.  90 tablet  3  . HYDROcodone-acetaminophen (NORCO/VICODIN) 5-325 MG per tablet Take 1-2 tablets by mouth every 6 (six) hours as needed. For pain  60 tablet  0  . insulin glargine (LANTUS SOLOSTAR) 100 UNIT/ML injection Inject 75 Units into the skin at bedtime.  10 mL  11  . lisinopril (PRINIVIL,ZESTRIL) 5 MG tablet Take 1 tablet (5 mg total) by mouth every morning.  90 tablet  3  . metFORMIN (GLUCOPHAGE) 1000 MG tablet Take 1,000 mg by mouth 2 (two) times daily with a meal.      . methocarbamol  (ROBAXIN) 500 MG tablet Take 1 tablet (500 mg total) by mouth every 6 (six) hours as needed.  40 tablet  0  . phentermine 37.5 MG capsule Take 1 capsule (37.5 mg total) by mouth every morning.  30 capsule  2  . simvastatin (ZOCOR) 80 MG tablet TAKE 1 TABLET BY MOUTH ONCE DAILY  90 tablet  3  . warfarin (COUMADIN) 1 MG tablet Take 1 tablet (1 mg total) by mouth every Monday, Wednesday, and Friday. Take as directed by coumadin clinic.  60 tablet  0  . warfarin (COUMADIN) 5 MG tablet Take 1 tablet (5 mg total) by mouth daily. Take as directed by coumadin clinic.  180 tablet  0   No current facility-administered medications on file prior to visit.    Allergies  Allergen Reactions  . Other     NO BLOOD -JEHOVAH'S WITNESS-SIGNED REFUSAL BUT WOULD TAKE ALBUMIN    Family History  Problem Relation Age of Onset  . Hypertension Mother   . Prostate cancer Father   . Diabetes Sister     died with DM 2001-05-14, 2 more sisters with diabetes  . Diabetes Brother     BP 130/70  Pulse 88  Wt 376 lb (170.552 kg)  BMI 48.25 kg/m2  SpO2 97%  Review of Systems denies weight loss, blurry vision, headache, chest pain, sob, n/v, urinary frequency, cramps, memory loss, hypoglycemia, rhinorrhea, and easy bruising.  He has depression and excessive diaphoresis.    Objective:   Physical Exam VS: see vs page GEN: no distress HEAD: head: no deformity eyes: no periorbital swelling, no proptosis external nose and ears are normal mouth: no lesion seen NECK: supple, thyroid is not enlarged CHEST WALL: no deformity LUNGS: clear to auscultation BREASTS:  No gynecomastia CV: reg rate and rhythm, no murmur ABD: abdomen is soft, nontender.  no hepatosplenomegaly.  not distended.  no hernia MUSCULOSKELETAL: muscle bulk and strength are grossly normal.  no obvious joint swelling.  gait is normal and steady EXTEMITIES: no deformity.  no ulcer on the feet.  feet are of normal color and temp.  no edema.  Both great toes  are surgically absent. PULSES: dorsalis pedis intact bilat.  no carotid bruit NEURO:  cn 2-12 grossly intact.   readily moves all 4's.  sensation is intact to touch on the  feet SKIN:  Normal texture and temperature.  No rash or suspicious lesion is visible.   NODES:  None palpable at the neck PSYCH: alert, oriented x3.  Does not appear anxious nor depressed.  Lab Results  Component Value Date   HGBA1C 11.6* 01/01/2013      Assessment & Plan:  IDDM, needs increased rx OA: this limits exercise rx Smoker.  This exacerbates the complications of DM Morbid obesity.  He would benefit from bariatric surgery

## 2013-02-06 NOTE — Patient Instructions (Addendum)
good diet and exercise habits significanly improve the control of your diabetes.  please let me know if you wish to be referred to a dietician, o fopr weight-loss surgery.  high blood sugar is very risky to your health.  you should see an eye doctor every year.  You are at higher than average risk for pneumonia and hepatitis-B.  You should be vaccinated against both.     controlling your blood pressure and cholesterol drastically reduces the damage diabetes does to your body.  this also applies to quitting smoking.  please discuss these with your doctor.  you should take an aspirin every day, unless you have been advised by a doctor not to.   check your blood sugar twice a day.  vary the time of day when you check, between before the 3 meals, and at bedtime.  also check if you have symptoms of your blood sugar being too high or too low.  please keep a record of the readings and bring it to your next appointment here.  please call us sooner if your blood sugar goes below 70, or if you have a lot of readings over 200.   For now, please stop the metformin and glipizide.    Also, please increase the lantus to 80 units daily.   Please come back for a follow-up appointment in 2 weeks.    Refer to a weight-loss surgery specialist.  you will receive a phone call, about a day and time for an appointment

## 2013-02-13 ENCOUNTER — Encounter: Payer: Self-pay | Admitting: Internal Medicine

## 2013-02-13 ENCOUNTER — Telehealth: Payer: Self-pay | Admitting: Endocrinology

## 2013-02-13 NOTE — Telephone Encounter (Signed)
Blood glucose readings staying over 200. Patient was told to call the office if this happens. Please call patient. [phone] / Sherri S.

## 2013-02-13 NOTE — Telephone Encounter (Signed)
[name], can you please tell them to increase the Lantus to 90 units?

## 2013-02-14 NOTE — Telephone Encounter (Signed)
Pt advised and states an understanding 

## 2013-02-20 ENCOUNTER — Ambulatory Visit (INDEPENDENT_AMBULATORY_CARE_PROVIDER_SITE_OTHER): Payer: BC Managed Care – PPO | Admitting: Endocrinology

## 2013-02-20 VITALS — BP 138/80 | HR 78 | Wt 377.0 lb

## 2013-02-20 DIAGNOSIS — E119 Type 2 diabetes mellitus without complications: Secondary | ICD-10-CM

## 2013-02-20 MED ORDER — INSULIN GLARGINE 100 UNIT/ML ~~LOC~~ SOLN: 80.0000 [IU] | Freq: Every day | SUBCUTANEOUS | Status: DC

## 2013-02-20 MED ORDER — INSULIN LISPRO 100 UNIT/ML ~~LOC~~ SOLN: 15.0000 [IU] | Freq: Three times a day (TID) | SUBCUTANEOUS | Status: DC

## 2013-02-20 MED ORDER — INSULIN GLARGINE 100 UNIT/ML ~~LOC~~ SOLN: 90.0000 [IU] | Freq: Every day | SUBCUTANEOUS | Status: DC

## 2013-02-20 NOTE — Patient Instructions (Addendum)
check your blood sugar twice a day.  vary the time of day when you check, between before the 3 meals, and at bedtime.  also check if you have symptoms of your blood sugar being too high or too low.  please keep a record of the readings and bring it to your next appointment here.  please call us sooner if your blood sugar goes below 70, or if you have a lot of readings over 200.   Also, please continue the lantus, 90 units daily.   Add humalog, 15 units 3 times a day (just before each meal).  i have sent a prescription to your pharmacy. Please come back for a follow-up appointment in 2-3 weeks.

## 2013-02-20 NOTE — Progress Notes (Signed)
Subjective:    Patient ID: [name], male    DOB: 03-29-1956, 57 y.o.   MRN: [id number]  HPI Pt returns for f/u of insulin-requiring DM (dx'ed 1996; complicated by renal insufficiency and autonomic neuropathy).  he brings a record of his cbg's which i have reviewed today.  It varies from 220-290.  It is in general higher as the day goes on.  pt states he feels well in general. Past Medical History  Diagnosis Date  . Diabetes mellitus     type II  . Hyperlipidemia   . Hypertension   . DVT, lower extremity     right  . Embolus 2005    pulmonary  . Depression   . Obesity   . BACK PAIN   . COLONIC POLYPS, HX OF   . DEGENERATIVE JOINT DISEASE, KNEES, BILATERAL   . DEPRESSION   . DIVERTICULOSIS, COLON   . HYPOGONADISM, MALE   . HYPOTENSION, ORTHOSTATIC   . OBESITY, MORBID   . Long term current use of anticoagulant   . Obstructive sleep apnea     uses cpap setting 8 to 10  . Kidney stones   . Renal insufficiency   . RENAL INSUFFICIENCY   . Shortness of breath     Past Surgical History  Procedure Laterality Date  . Knee surgury  2000 & 2003     cartilage damage  . Ankle surgury  2001 bilateral    with bone spurs and torn tendon  . Edg  2008    chronic Duodenitis  . Tonsillectomy and adenoidectomy  age 9 or 43  . Total knee arthroplasty  01/10/2012    Procedure: TOTAL KNEE ARTHROPLASTY;  Surgeon: Shelda Pal, MD;  Location: [facility];  Service: Orthopedics;  Laterality: Left;  . Lumbar laminectomy/decompression microdiscectomy  09/28/2012    Procedure: LUMBAR LAMINECTOMY/DECOMPRESSION MICRODISCECTOMY 2 LEVELS;  Surgeon: Drucilla Schmidt, MD;  Location: [facility];  Service: Orthopedics;  Laterality: Left;  Decompressive laminectomy L4-L5. Microdiscectomy L5-S1  . Heel spur surgery Bilateral 2005  . Replacement total knee  2013    History   Social History  . Marital Status: Single    Spouse Name: N/A    Number of Children: 2  . Years of Education: N/A   Occupational  History  . back at work at [employer] since Jan. 2010    Social History Main Topics  . Smoking status: Former Smoker -- 0.25 packs/day for 5 years    Quit date: 12/06/1978  . Smokeless tobacco: Not on file  . Alcohol Use: Yes     Comment: occasional  . Drug Use: No  . Sexually Active: Not on file   Other Topics Concern  . Not on file   Social History Narrative   Daily caffeine use one per day    Current Outpatient Prescriptions on File Prior to Visit  Medication Sig Dispense Refill  . albuterol (PROAIR HFA) 108 (90 BASE) MCG/ACT inhaler Inhale 2 puffs into the lungs every 4 (four) hours as needed. For shortness of breath      . aspirin (ECOTRIN LOW STRENGTH) 81 MG EC tablet Take 81 mg by mouth daily before breakfast.       . aspirin EC 81 MG tablet Take 81 mg by mouth every morning.      . CYMBALTA 60 MG capsule TAKE ONE CAPSULE BY MOUTH EVERY DAY  90 capsule  1  . doxycycline (VIBRA-TABS) 100 MG tablet Take 1 tablet (100  mg total) by mouth 2 (two) times daily.  20 tablet  0  . ezetimibe (ZETIA) 10 MG tablet Take 1 tablet (10 mg total) by mouth at bedtime.  90 tablet  3  . hydrochlorothiazide (HYDRODIURIL) 25 MG tablet Take 1 tablet (25 mg total) by mouth every morning.  90 tablet  3  . HYDROcodone-acetaminophen (NORCO/VICODIN) 5-325 MG per tablet Take 1-2 tablets by mouth every 6 (six) hours as needed. For pain  60 tablet  0  . lisinopril (PRINIVIL,ZESTRIL) 5 MG tablet Take 1 tablet (5 mg total) by mouth every morning.  90 tablet  3  . methocarbamol (ROBAXIN) 500 MG tablet Take 1 tablet (500 mg total) by mouth every 6 (six) hours as needed.  40 tablet  0  . phentermine 37.5 MG capsule Take 1 capsule (37.5 mg total) by mouth every morning.  30 capsule  2  . simvastatin (ZOCOR) 80 MG tablet TAKE 1 TABLET BY MOUTH ONCE DAILY  90 tablet  3  . warfarin (COUMADIN) 1 MG tablet Take 1 tablet (1 mg total) by mouth every Monday, Wednesday, and Friday. Take as directed by coumadin  clinic.  60 tablet  0  . warfarin (COUMADIN) 5 MG tablet Take 1 tablet (5 mg total) by mouth daily. Take as directed by coumadin clinic.  180 tablet  0   No current facility-administered medications on file prior to visit.    Allergies  Allergen Reactions  . Other     NO BLOOD -JEHOVAH'S WITNESS-SIGNED REFUSAL BUT WOULD TAKE ALBUMIN    Family History  Problem Relation Age of Onset  . Hypertension Mother   . Prostate cancer Father   . Diabetes Sister     died with DM 05-12-01, 2 more sisters with diabetes  . Diabetes Brother     BP 138/80  Pulse 78  Wt 377 lb (171.006 kg)  BMI 48.38 kg/m2  SpO2 98%  Review of Systems denies hypoglycemia    Objective:   Physical Exam VITAL SIGNS:  See vs page.   GENERAL: no distress. SKIN:  Insulin injection sites at the anterior abdomen are normal.       Assessment & Plan:  DM: needs increased rx

## 2013-02-27 ENCOUNTER — Ambulatory Visit (INDEPENDENT_AMBULATORY_CARE_PROVIDER_SITE_OTHER): Payer: BC Managed Care – PPO | Admitting: General Practice

## 2013-02-27 DIAGNOSIS — I2699 Other pulmonary embolism without acute cor pulmonale: Secondary | ICD-10-CM

## 2013-02-27 LAB — POCT INR: INR: 2.1

## 2013-02-28 ENCOUNTER — Telehealth: Payer: Self-pay

## 2013-02-28 ENCOUNTER — Encounter: Payer: Self-pay | Admitting: Internal Medicine

## 2013-02-28 NOTE — Telephone Encounter (Signed)
Called the patient to pickup information to take to [facility] for Bariatric surgery.  Information included letter of necessity from MD and office notes from the past 5 years.  Patient will pickup at the front desk.

## 2013-03-13 ENCOUNTER — Ambulatory Visit (INDEPENDENT_AMBULATORY_CARE_PROVIDER_SITE_OTHER): Payer: Self-pay | Admitting: Endocrinology

## 2013-03-13 VITALS — BP 128/72 | HR 76 | Ht 74.0 in | Wt 381.0 lb

## 2013-03-13 DIAGNOSIS — E119 Type 2 diabetes mellitus without complications: Secondary | ICD-10-CM

## 2013-03-13 NOTE — Patient Instructions (Addendum)
check your blood sugar twice a day.  vary the time of day when you check, between before the 3 meals, and at bedtime.  also check if you have symptoms of your blood sugar being too high or too low.  please keep a record of the readings and bring it to your next appointment here.  please call us sooner if your blood sugar goes below 70, or if you have a lot of readings over 200.   please continue the lantus, 90 units daily.   Please increase the humalog, to 3 times a day (just before each meal), 15-35-35 units. Please come back for a follow-up appointment in 1 month.

## 2013-03-13 NOTE — Progress Notes (Signed)
Subjective:    Patient ID: [name], male    DOB: 10/07/1956, 57 y.o.   MRN: [id number]  HPI Pt returns for f/u of insulin-requiring DM (dx'ed 1996; complicated by renal insufficiency and autonomic neuropathy).  he brings a record of his cbg's which i have reviewed today.  It varies from 121-270.  It is in general lowest before lunch, and higher at all other times of day.  pt states he feels well in general. Past Medical History  Diagnosis Date  . Diabetes mellitus     type II  . Hyperlipidemia   . Hypertension   . DVT, lower extremity     right  . Embolus 2005    pulmonary  . Depression   . Obesity   . BACK PAIN   . COLONIC POLYPS, HX OF   . DEGENERATIVE JOINT DISEASE, KNEES, BILATERAL   . DEPRESSION   . DIVERTICULOSIS, COLON   . HYPOGONADISM, MALE   . HYPOTENSION, ORTHOSTATIC   . OBESITY, MORBID   . Long term current use of anticoagulant   . Obstructive sleep apnea     uses cpap setting 8 to 10  . Kidney stones   . Renal insufficiency   . RENAL INSUFFICIENCY   . Shortness of breath     Past Surgical History  Procedure Laterality Date  . Knee surgury  2000 & 2003     cartilage damage  . Ankle surgury  2001 bilateral    with bone spurs and torn tendon  . Edg  2008    chronic Duodenitis  . Tonsillectomy and adenoidectomy  age 4 or 3  . Total knee arthroplasty  01/10/2012    Procedure: TOTAL KNEE ARTHROPLASTY;  Surgeon: Shelda Pal, MD;  Location: [facility];  Service: Orthopedics;  Laterality: Left;  . Lumbar laminectomy/decompression microdiscectomy  09/28/2012    Procedure: LUMBAR LAMINECTOMY/DECOMPRESSION MICRODISCECTOMY 2 LEVELS;  Surgeon: Drucilla Schmidt, MD;  Location: [facility];  Service: Orthopedics;  Laterality: Left;  Decompressive laminectomy L4-L5. Microdiscectomy L5-S1  . Heel spur surgery Bilateral 2005  . Replacement total knee  2013    History   Social History  . Marital Status: Single    Spouse Name: N/A    Number of Children: 2  . Years of  Education: N/A   Occupational History  . back at work at [employer] since Jan. 2010    Social History Main Topics  . Smoking status: Former Smoker -- 0.25 packs/day for 5 years    Quit date: 12/06/1978  . Smokeless tobacco: Not on file  . Alcohol Use: Yes     Comment: occasional  . Drug Use: No  . Sexually Active: Not on file   Other Topics Concern  . Not on file   Social History Narrative   Daily caffeine use one per day    Current Outpatient Prescriptions on File Prior to Visit  Medication Sig Dispense Refill  . albuterol (PROAIR HFA) 108 (90 BASE) MCG/ACT inhaler Inhale 2 puffs into the lungs every 4 (four) hours as needed. For shortness of breath      . aspirin (ECOTRIN LOW STRENGTH) 81 MG EC tablet Take 81 mg by mouth daily before breakfast.       . aspirin EC 81 MG tablet Take 81 mg by mouth every morning.      . CYMBALTA 60 MG capsule TAKE ONE CAPSULE BY MOUTH EVERY DAY  90 capsule  1  . doxycycline (VIBRA-TABS) 100 MG  tablet Take 1 tablet (100 mg total) by mouth 2 (two) times daily.  20 tablet  0  . ezetimibe (ZETIA) 10 MG tablet Take 1 tablet (10 mg total) by mouth at bedtime.  90 tablet  3  . hydrochlorothiazide (HYDRODIURIL) 25 MG tablet Take 1 tablet (25 mg total) by mouth every morning.  90 tablet  3  . HYDROcodone-acetaminophen (NORCO/VICODIN) 5-325 MG per tablet Take 1-2 tablets by mouth every 6 (six) hours as needed. For pain  60 tablet  0  . insulin glargine (LANTUS) 100 UNIT/ML injection Inject 0.9 mLs (90 Units total) into the skin at bedtime.  90 mL  3  . lisinopril (PRINIVIL,ZESTRIL) 5 MG tablet Take 1 tablet (5 mg total) by mouth every morning.  90 tablet  3  . methocarbamol (ROBAXIN) 500 MG tablet Take 1 tablet (500 mg total) by mouth every 6 (six) hours as needed.  40 tablet  0  . phentermine 37.5 MG capsule Take 1 capsule (37.5 mg total) by mouth every morning.  30 capsule  2  . simvastatin (ZOCOR) 80 MG tablet TAKE 1 TABLET BY MOUTH ONCE DAILY   90 tablet  3  . warfarin (COUMADIN) 1 MG tablet Take 1 tablet (1 mg total) by mouth every Monday, Wednesday, and Friday. Take as directed by coumadin clinic.  60 tablet  0  . warfarin (COUMADIN) 5 MG tablet Take 1 tablet (5 mg total) by mouth daily. Take as directed by coumadin clinic.  180 tablet  0   No current facility-administered medications on file prior to visit.    Allergies  Allergen Reactions  . Other     NO BLOOD -JEHOVAH'S WITNESS-SIGNED REFUSAL BUT WOULD TAKE ALBUMIN   Family History  Problem Relation Age of Onset  . Hypertension Mother   . Prostate cancer Father   . Diabetes Sister     died with DM Apr 18, 2001, 2 more sisters with diabetes  . Diabetes Brother     BP 128/72  Pulse 76  Ht 6\' 2"  (1.88 m)  Wt 381 lb (172.82 kg)  BMI 48.9 kg/m2  SpO2 98%  Review of Systems denies hypoglycemia    Objective:   Physical Exam VITAL SIGNS:  See vs page GENERAL: no distress PSYCH: Alert and oriented x 3.  Does not appear anxious nor depressed.       Assessment & Plan:  DM: needs increased rx

## 2013-03-26 ENCOUNTER — Other Ambulatory Visit: Payer: Self-pay | Admitting: Internal Medicine

## 2013-03-26 ENCOUNTER — Other Ambulatory Visit: Payer: Self-pay | Admitting: Endocrinology

## 2013-03-29 ENCOUNTER — Encounter: Payer: Self-pay | Admitting: Endocrinology

## 2013-03-29 ENCOUNTER — Other Ambulatory Visit: Payer: Self-pay | Admitting: Internal Medicine

## 2013-03-29 NOTE — Telephone Encounter (Signed)
Done hardcopy to robin  

## 2013-03-29 NOTE — Telephone Encounter (Signed)
Faxed hardcopy to pharmacy. 

## 2013-04-02 ENCOUNTER — Other Ambulatory Visit: Payer: Self-pay | Admitting: Internal Medicine

## 2013-04-06 ENCOUNTER — Telehealth: Payer: Self-pay | Admitting: Endocrinology

## 2013-04-06 MED ORDER — INSULIN LISPRO 100 UNIT/ML ~~LOC~~ SOLN: 15.0000 [IU] | Freq: Three times a day (TID) | SUBCUTANEOUS | Status: DC

## 2013-04-10 ENCOUNTER — Ambulatory Visit (INDEPENDENT_AMBULATORY_CARE_PROVIDER_SITE_OTHER): Payer: BC Managed Care – PPO | Admitting: General Practice

## 2013-04-10 DIAGNOSIS — I2699 Other pulmonary embolism without acute cor pulmonale: Secondary | ICD-10-CM

## 2013-04-10 LAB — POCT INR: INR: 2.1

## 2013-04-11 ENCOUNTER — Encounter: Payer: Self-pay | Admitting: Endocrinology

## 2013-04-11 ENCOUNTER — Ambulatory Visit (INDEPENDENT_AMBULATORY_CARE_PROVIDER_SITE_OTHER): Payer: BC Managed Care – PPO | Admitting: Endocrinology

## 2013-04-11 VITALS — BP 134/68 | HR 76 | Temp 97.8°F | Ht 74.0 in | Wt 384.0 lb

## 2013-04-11 DIAGNOSIS — E119 Type 2 diabetes mellitus without complications: Secondary | ICD-10-CM

## 2013-04-11 MED ORDER — INSULIN LISPRO 100 UNIT/ML (KWIKPEN): PEN_INJECTOR | SUBCUTANEOUS | Status: DC

## 2013-04-11 NOTE — Progress Notes (Signed)
Subjective:    Patient ID: [name], male    DOB: November 24, 1956, 57 y.o.   MRN: [id number]  HPI Pt returns for f/u of insulin-requiring DM (dx'ed 1996; complicated by renal insufficiency and autonomic neuropathy; he has never had severe hypoglycemia or DKA).  he brings a record of his cbg's which i have reviewed today.  It varies from 61-190.  It is in general higher as the day goes on.  pt states he feels well in general.   Past Medical History  Diagnosis Date  . Diabetes mellitus     type II  . Hyperlipidemia   . Hypertension   . DVT, lower extremity     right  . Embolus 2005    pulmonary  . Depression   . Obesity   . BACK PAIN   . COLONIC POLYPS, HX OF   . DEGENERATIVE JOINT DISEASE, KNEES, BILATERAL   . DEPRESSION   . DIVERTICULOSIS, COLON   . HYPOGONADISM, MALE   . HYPOTENSION, ORTHOSTATIC   . OBESITY, MORBID   . Long term current use of anticoagulant   . Obstructive sleep apnea     uses cpap setting 8 to 10  . Kidney stones   . Renal insufficiency   . RENAL INSUFFICIENCY   . Shortness of breath     Past Surgical History  Procedure Laterality Date  . Knee surgury  2000 & 2003     cartilage damage  . Ankle surgury  2001 bilateral    with bone spurs and torn tendon  . Edg  2008    chronic Duodenitis  . Tonsillectomy and adenoidectomy  age 80 or 47  . Total knee arthroplasty  01/10/2012    Procedure: TOTAL KNEE ARTHROPLASTY;  Surgeon: Shelda Pal, MD;  Location: [facility];  Service: Orthopedics;  Laterality: Left;  . Lumbar laminectomy/decompression microdiscectomy  09/28/2012    Procedure: LUMBAR LAMINECTOMY/DECOMPRESSION MICRODISCECTOMY 2 LEVELS;  Surgeon: Drucilla Schmidt, MD;  Location: [facility];  Service: Orthopedics;  Laterality: Left;  Decompressive laminectomy L4-L5. Microdiscectomy L5-S1  . Heel spur surgery Bilateral 2005  . Replacement total knee  2013    History   Social History  . Marital Status: Single    Spouse Name: N/A    Number of Children:  2  . Years of Education: N/A   Occupational History  . back at work at [employer] since Jan. 2010    Social History Main Topics  . Smoking status: Former Smoker -- 0.25 packs/day for 5 years    Quit date: 12/06/1978  . Smokeless tobacco: Not on file  . Alcohol Use: Yes     Comment: occasional  . Drug Use: No  . Sexually Active: Not on file   Other Topics Concern  . Not on file   Social History Narrative   Daily caffeine use one per day    Current Outpatient Prescriptions on File Prior to Visit  Medication Sig Dispense Refill  . albuterol (PROAIR HFA) 108 (90 BASE) MCG/ACT inhaler Inhale 2 puffs into the lungs every 4 (four) hours as needed. For shortness of breath      . aspirin (ECOTRIN LOW STRENGTH) 81 MG EC tablet Take 81 mg by mouth daily before breakfast.       . aspirin EC 81 MG tablet Take 81 mg by mouth every morning.      . BD PEN NEEDLE NANO U/F 32G X 4 MM MISC USE AS DIRECTED 4 TIMES A DAY  100 each  0  . CYMBALTA 60 MG capsule TAKE ONE CAPSULE BY MOUTH EVERY DAY  90 capsule  1  . doxycycline (VIBRA-TABS) 100 MG tablet Take 1 tablet (100 mg total) by mouth 2 (two) times daily.  20 tablet  0  . ezetimibe (ZETIA) 10 MG tablet Take 1 tablet (10 mg total) by mouth at bedtime.  90 tablet  3  . hydrochlorothiazide (HYDRODIURIL) 25 MG tablet Take 1 tablet (25 mg total) by mouth every morning.  90 tablet  3  . HYDROcodone-acetaminophen (NORCO/VICODIN) 5-325 MG per tablet Take 1-2 tablets by mouth every 6 (six) hours as needed. For pain  60 tablet  0  . lisinopril (PRINIVIL,ZESTRIL) 5 MG tablet TAKE 1 BY MOUTH ONCE DAILY  90 tablet  3  . methocarbamol (ROBAXIN) 500 MG tablet Take 1 tablet (500 mg total) by mouth every 6 (six) hours as needed.  40 tablet  0  . phentermine (ADIPEX-P) 37.5 MG tablet TAKE 1 TABLET BY MOUTH EVERY MORNING  30 tablet  2  . phentermine 37.5 MG capsule Take 1 capsule (37.5 mg total) by mouth every morning.  30 capsule  2  . simvastatin  (ZOCOR) 80 MG tablet TAKE 1 TABLET BY MOUTH ONCE DAILY  90 tablet  3  . warfarin (COUMADIN) 1 MG tablet Take 1 tablet (1 mg total) by mouth every Monday, Wednesday, and Friday. Take as directed by coumadin clinic.  60 tablet  0  . warfarin (COUMADIN) 5 MG tablet TAKE 1 TABLET (5 MG TOTAL) BY MOUTH DAILY. TAKE AS DIRECTED BY COUMADIN CLINIC.  180 tablet  0   No current facility-administered medications on file prior to visit.    Allergies  Allergen Reactions  . Other     NO BLOOD -JEHOVAH'S WITNESS-SIGNED REFUSAL BUT WOULD TAKE ALBUMIN   Family History  Problem Relation Age of Onset  . Hypertension Mother   . Prostate cancer Father   . Diabetes Sister     died with DM 16-Apr-2001, 2 more sisters with diabetes  . Diabetes Brother    BP 134/68  Pulse 76  Temp(Src) 97.8 F (36.6 C) (Oral)  Ht 6\' 2"  (1.88 m)  Wt 384 lb (174.181 kg)  BMI 49.28 kg/m2  SpO2 97%  Review of Systems Denies LOC    Objective:   Physical Exam VITAL SIGNS:  See vs page GENERAL: no distress PSYCH: Alert and oriented x 3.  Does not appear anxious nor depressed.     Assessment & Plan:  DM: The pattern of his cbg's indicates he needs some adjustment in his therapy

## 2013-04-11 NOTE — Patient Instructions (Addendum)
check your blood sugar twice a day.  vary the time of day when you check, between before the 3 meals, and at bedtime.  also check if you have symptoms of your blood sugar being too high or too low.  please keep a record of the readings and bring it to your next appointment here.  please call us sooner if your blood sugar goes below 70, or if you have a lot of readings over 200.   please reduce the lantus to 80 units daily.   Please increase the humalog, to 3 times a day (just before each meal), 15-40-35 units. Please come back for a follow-up appointment in 1 month.

## 2013-05-03 ENCOUNTER — Other Ambulatory Visit: Payer: Self-pay | Admitting: Endocrinology

## 2013-05-13 ENCOUNTER — Other Ambulatory Visit: Payer: Self-pay | Admitting: Internal Medicine

## 2013-05-16 ENCOUNTER — Ambulatory Visit (INDEPENDENT_AMBULATORY_CARE_PROVIDER_SITE_OTHER): Payer: BC Managed Care – PPO | Admitting: Endocrinology

## 2013-05-16 ENCOUNTER — Encounter: Payer: Self-pay | Admitting: Endocrinology

## 2013-05-16 VITALS — BP 130/80 | HR 80 | Wt 393.0 lb

## 2013-05-16 DIAGNOSIS — E119 Type 2 diabetes mellitus without complications: Secondary | ICD-10-CM

## 2013-05-16 NOTE — Progress Notes (Signed)
Subjective:    Patient ID: [name], male    DOB: 11-22-1956, 56 y.o.   MRN: [id number]  HPI Pt returns for f/u of insulin-requiring DM (dx'ed 1996; he has mild if any neuropathy of the lower extremities; he has associated complications of renal insufficiency and autonomic neuropathy; he has never had severe hypoglycemia or DKA).  no cbg record, but states cbg's are sometimes low in the early hrs of the am.  It is highest in the afternoon (approx 180). Past Medical History  Diagnosis Date  . Diabetes mellitus     type II  . Hyperlipidemia   . Hypertension   . DVT, lower extremity     right  . Embolus 2005    pulmonary  . Depression   . Obesity   . BACK PAIN   . COLONIC POLYPS, HX OF   . DEGENERATIVE JOINT DISEASE, KNEES, BILATERAL   . DEPRESSION   . DIVERTICULOSIS, COLON   . HYPOGONADISM, MALE   . HYPOTENSION, ORTHOSTATIC   . OBESITY, MORBID   . Long term current use of anticoagulant   . Obstructive sleep apnea     uses cpap setting 8 to 10  . Kidney stones   . Renal insufficiency   . RENAL INSUFFICIENCY   . Shortness of breath     Past Surgical History  Procedure Laterality Date  . Knee surgury  2000 & 2003     cartilage damage  . Ankle surgury  2001 bilateral    with bone spurs and torn tendon  . Edg  2008    chronic Duodenitis  . Tonsillectomy and adenoidectomy  age 34 or 63  . Total knee arthroplasty  01/10/2012    Procedure: TOTAL KNEE ARTHROPLASTY;  Surgeon: Shelda Pal, MD;  Location: [facility];  Service: Orthopedics;  Laterality: Left;  . Lumbar laminectomy/decompression microdiscectomy  09/28/2012    Procedure: LUMBAR LAMINECTOMY/DECOMPRESSION MICRODISCECTOMY 2 LEVELS;  Surgeon: Drucilla Schmidt, MD;  Location: [facility];  Service: Orthopedics;  Laterality: Left;  Decompressive laminectomy L4-L5. Microdiscectomy L5-S1  . Heel spur surgery Bilateral 2005  . Replacement total knee  2013    History   Social History  . Marital Status: Single    Spouse  Name: N/A    Number of Children: 2  . Years of Education: N/A   Occupational History  . back at work at [employer] since Jan. 2010    Social History Main Topics  . Smoking status: Former Smoker -- 0.25 packs/day for 5 years    Quit date: 12/06/1978  . Smokeless tobacco: Not on file  . Alcohol Use: Yes     Comment: occasional  . Drug Use: No  . Sexually Active: Not on file   Other Topics Concern  . Not on file   Social History Narrative   Daily caffeine use one per day    Current Outpatient Prescriptions on File Prior to Visit  Medication Sig Dispense Refill  . albuterol (PROAIR HFA) 108 (90 BASE) MCG/ACT inhaler Inhale 2 puffs into the lungs every 4 (four) hours as needed. For shortness of breath      . aspirin (ECOTRIN LOW STRENGTH) 81 MG EC tablet Take 81 mg by mouth daily before breakfast.       . aspirin EC 81 MG tablet Take 81 mg by mouth every morning.      . BD PEN NEEDLE NANO U/F 32G X 4 MM MISC USE AS DIRECTED 4 TIMES A DAY  100 each  2  . doxycycline (VIBRA-TABS) 100 MG tablet Take 1 tablet (100 mg total) by mouth 2 (two) times daily.  20 tablet  0  . DULoxetine (CYMBALTA) 60 MG capsule TAKE 1 CAPSULE BY MOUTH EVERY DAY  90 capsule  2  . ezetimibe (ZETIA) 10 MG tablet Take 1 tablet (10 mg total) by mouth at bedtime.  90 tablet  3  . hydrochlorothiazide (HYDRODIURIL) 25 MG tablet Take 1 tablet (25 mg total) by mouth every morning.  90 tablet  3  . HYDROcodone-acetaminophen (NORCO/VICODIN) 5-325 MG per tablet Take 1-2 tablets by mouth every 6 (six) hours as needed. For pain  60 tablet  0  . insulin glargine (LANTUS) 100 UNIT/ML injection Inject 70 Units into the skin at bedtime.       Marland Kitchen lisinopril (PRINIVIL,ZESTRIL) 5 MG tablet TAKE 1 BY MOUTH ONCE DAILY  90 tablet  3  . methocarbamol (ROBAXIN) 500 MG tablet Take 1 tablet (500 mg total) by mouth every 6 (six) hours as needed.  40 tablet  0  . phentermine (ADIPEX-P) 37.5 MG tablet TAKE 1 TABLET BY MOUTH EVERY  MORNING  30 tablet  2  . phentermine 37.5 MG capsule Take 1 capsule (37.5 mg total) by mouth every morning.  30 capsule  2  . simvastatin (ZOCOR) 80 MG tablet TAKE 1 TABLET BY MOUTH ONCE DAILY  90 tablet  3  . warfarin (COUMADIN) 1 MG tablet Take 1 tablet (1 mg total) by mouth every Monday, Wednesday, and Friday. Take as directed by coumadin clinic.  60 tablet  0  . warfarin (COUMADIN) 5 MG tablet TAKE 1 TABLET (5 MG TOTAL) BY MOUTH DAILY. TAKE AS DIRECTED BY COUMADIN CLINIC.  180 tablet  0   No current facility-administered medications on file prior to visit.    Allergies  Allergen Reactions  . Other     NO BLOOD -JEHOVAH'S WITNESS-SIGNED REFUSAL BUT WOULD TAKE ALBUMIN   Family History  Problem Relation Age of Onset  . Hypertension Mother   . Prostate cancer Father   . Diabetes Sister     died with DM 04/24/2001, 2 more sisters with diabetes  . Diabetes Brother    BP 130/80  Pulse 80  Wt 393 lb (178.264 kg)  BMI 50.44 kg/m2  SpO2 96%  Review of Systems Denies LOC, but he has weight gain.    Objective:   Physical Exam VITAL SIGNS:  See vs page GENERAL: no distress.  Morbid obesity.    Lab Results  Component Value Date   HGBA1C 9.3* 05/16/2013      Assessment & Plan:  DM: This insulin regimen was chosen from multiple options, as it best matches his insulin to his changing requirements throughout the day.  The pattern of his cbg's indicates he needs some adjustment in his therapy.  The benefits of glycemic control must be weighed against the risks of hypoglycemia.

## 2013-05-16 NOTE — Patient Instructions (Addendum)
check your blood sugar twice a day.  vary the time of day when you check, between before the 3 meals, and at bedtime.  also check if you have symptoms of your blood sugar being too high or too low.  please keep a record of the readings and bring it to your next appointment here.  please call us sooner if your blood sugar goes below 70, or if you have a lot of readings over 200.   please reduce the lantus to 70 units daily.   Please increase the humalog, to 3 times a day (just before each meal), 15-50-35 units. Please come back for a follow-up appointment in 1 month.  blood tests are being requested for you today.  We'll contact you with results.

## 2013-05-22 ENCOUNTER — Ambulatory Visit (INDEPENDENT_AMBULATORY_CARE_PROVIDER_SITE_OTHER): Payer: BC Managed Care – PPO | Admitting: General Practice

## 2013-05-22 DIAGNOSIS — I2699 Other pulmonary embolism without acute cor pulmonale: Secondary | ICD-10-CM

## 2013-06-20 ENCOUNTER — Ambulatory Visit: Payer: Self-pay | Admitting: Endocrinology

## 2013-06-20 ENCOUNTER — Ambulatory Visit (INDEPENDENT_AMBULATORY_CARE_PROVIDER_SITE_OTHER): Payer: BC Managed Care – PPO | Admitting: Endocrinology

## 2013-06-20 ENCOUNTER — Encounter: Payer: Self-pay | Admitting: Endocrinology

## 2013-06-20 VITALS — BP 122/70 | HR 84 | Temp 99.3°F | Resp 12 | Wt 396.0 lb

## 2013-06-20 DIAGNOSIS — E119 Type 2 diabetes mellitus without complications: Secondary | ICD-10-CM

## 2013-06-20 NOTE — Patient Instructions (Addendum)
check your blood sugar twice a day.  vary the time of day when you check, between before the 3 meals, and at bedtime.  also check if you have symptoms of your blood sugar being too high or too low.  please keep a record of the readings and bring it to your next appointment here.  please call us sooner if your blood sugar goes below 70, or if you have a lot of readings over 200.   Please continue the same insulins.   Please come back for a follow-up appointment in 1 month.

## 2013-06-20 NOTE — Progress Notes (Signed)
Subjective:    Patient ID: [name], male    DOB: 05-Dec-1956, 57 y.o.   MRN: [id number]  HPI Pt returns for f/u of insulin-requiring DM (dx'ed 1996; he has mild if any neuropathy of the lower extremities; he has associated complications of renal insufficiency and autonomic neuropathy; he has never had severe hypoglycemia or DKA; therapy has been limited by pt's need for a simple regimen; he is working towards bariatric surgery).  he brings a record of his cbg's which i have reviewed today.  It varies from 90-130.  It is in general higher as the day goes on.  pt states he feels well in general. Past Medical History  Diagnosis Date  . Diabetes mellitus     type II  . Hyperlipidemia   . Hypertension   . DVT, lower extremity     right  . Embolus 2005    pulmonary  . Depression   . Obesity   . BACK PAIN   . COLONIC POLYPS, HX OF   . DEGENERATIVE JOINT DISEASE, KNEES, BILATERAL   . DEPRESSION   . DIVERTICULOSIS, COLON   . HYPOGONADISM, MALE   . HYPOTENSION, ORTHOSTATIC   . OBESITY, MORBID   . Long term current use of anticoagulant   . Obstructive sleep apnea     uses cpap setting 8 to 10  . Kidney stones   . Renal insufficiency   . RENAL INSUFFICIENCY   . Shortness of breath     Past Surgical History  Procedure Laterality Date  . Knee surgury  2000 & 2003     cartilage damage  . Ankle surgury  2001 bilateral    with bone spurs and torn tendon  . Edg  2008    chronic Duodenitis  . Tonsillectomy and adenoidectomy  age 41 or 59  . Total knee arthroplasty  01/10/2012    Procedure: TOTAL KNEE ARTHROPLASTY;  Surgeon: Shelda Pal, MD;  Location: [facility];  Service: Orthopedics;  Laterality: Left;  . Lumbar laminectomy/decompression microdiscectomy  09/28/2012    Procedure: LUMBAR LAMINECTOMY/DECOMPRESSION MICRODISCECTOMY 2 LEVELS;  Surgeon: Drucilla Schmidt, MD;  Location: [facility];  Service: Orthopedics;  Laterality: Left;  Decompressive laminectomy L4-L5. Microdiscectomy L5-S1   . Heel spur surgery Bilateral 2005  . Replacement total knee  2013    History   Social History  . Marital Status: Single    Spouse Name: N/A    Number of Children: 2  . Years of Education: N/A   Occupational History  . back at work at [employer] since Jan. 2010    Social History Main Topics  . Smoking status: Former Smoker -- 0.25 packs/day for 5 years    Quit date: 12/06/1978  . Smokeless tobacco: Not on file  . Alcohol Use: Yes     Comment: occasional  . Drug Use: No  . Sexually Active: Not on file   Other Topics Concern  . Not on file   Social History Narrative   Daily caffeine use one per day    Current Outpatient Prescriptions on File Prior to Visit  Medication Sig Dispense Refill  . albuterol (PROAIR HFA) 108 (90 BASE) MCG/ACT inhaler Inhale 2 puffs into the lungs every 4 (four) hours as needed. For shortness of breath      . aspirin (ECOTRIN LOW STRENGTH) 81 MG EC tablet Take 81 mg by mouth daily before breakfast.       . aspirin EC 81 MG tablet Take 81  mg by mouth every morning.      . BD PEN NEEDLE NANO U/F 32G X 4 MM MISC USE AS DIRECTED 4 TIMES A DAY  100 each  2  . DULoxetine (CYMBALTA) 60 MG capsule TAKE 1 CAPSULE BY MOUTH EVERY DAY  90 capsule  2  . ezetimibe (ZETIA) 10 MG tablet Take 1 tablet (10 mg total) by mouth at bedtime.  90 tablet  3  . hydrochlorothiazide (HYDRODIURIL) 25 MG tablet Take 1 tablet (25 mg total) by mouth every morning.  90 tablet  3  . HYDROcodone-acetaminophen (NORCO/VICODIN) 5-325 MG per tablet Take 1-2 tablets by mouth every 6 (six) hours as needed. For pain  60 tablet  0  . insulin glargine (LANTUS) 100 UNIT/ML injection Inject 70 Units into the skin at bedtime.       . insulin lispro (HUMALOG) 100 unit/mL SOLN 3 times a day (just before each meal), 15-50-35 units, and pen needles 4/day.      . lisinopril (PRINIVIL,ZESTRIL) 5 MG tablet TAKE 1 BY MOUTH ONCE DAILY  90 tablet  3  . phentermine (ADIPEX-P) 37.5 MG tablet  TAKE 1 TABLET BY MOUTH EVERY MORNING  30 tablet  2  . phentermine 37.5 MG capsule Take 1 capsule (37.5 mg total) by mouth every morning.  30 capsule  2  . simvastatin (ZOCOR) 80 MG tablet TAKE 1 TABLET BY MOUTH ONCE DAILY  90 tablet  3  . warfarin (COUMADIN) 1 MG tablet Take 1 tablet (1 mg total) by mouth every Monday, Wednesday, and 05-09-23. Take as directed by coumadin clinic.  60 tablet  0  . warfarin (COUMADIN) 5 MG tablet TAKE 1 TABLET (5 MG TOTAL) BY MOUTH DAILY. TAKE AS DIRECTED BY COUMADIN CLINIC.  180 tablet  0   No current facility-administered medications on file prior to visit.    Allergies  Allergen Reactions  . Other     NO BLOOD -JEHOVAH'S WITNESS-SIGNED REFUSAL BUT WOULD TAKE ALBUMIN    Family History  Problem Relation Age of Onset  . Hypertension Mother   . Prostate cancer Father   . Diabetes Sister     died with DM 05/08/2001, 2 more sisters with diabetes  . Diabetes Brother     BP 122/70  Pulse 84  Temp(Src) 99.3 F (37.4 C) (Oral)  Resp 12  Wt 396 lb (179.624 kg)  BMI 50.82 kg/m2  SpO2 96%    Review of Systems denies hypoglycemia and weight change.      Objective:   Physical Exam VITAL SIGNS:  See vs page GENERAL: no distress SKIN:  Insulin injection sites at the anterior abdomen are normal    Lab Results  Component Value Date   HGBA1C 9.3* 05/16/2013      Assessment & Plan:  DM: This insulin regimen was chosen from multiple options, for its simplicity.  The benefits of glycemic control must be weighed against the risks of hypoglycemia.  He needs increased rx

## 2013-06-25 ENCOUNTER — Other Ambulatory Visit: Payer: Self-pay | Admitting: General Practice

## 2013-06-25 ENCOUNTER — Other Ambulatory Visit: Payer: Self-pay | Admitting: Internal Medicine

## 2013-06-25 MED ORDER — WARFARIN SODIUM 5 MG PO TABS: ORAL_TABLET | ORAL | Status: DC

## 2013-07-02 ENCOUNTER — Ambulatory Visit: Payer: Self-pay | Admitting: Internal Medicine

## 2013-07-02 DIAGNOSIS — Z0289 Encounter for other administrative examinations: Secondary | ICD-10-CM

## 2013-07-03 ENCOUNTER — Ambulatory Visit (INDEPENDENT_AMBULATORY_CARE_PROVIDER_SITE_OTHER): Payer: BC Managed Care – PPO | Admitting: General Practice

## 2013-07-03 DIAGNOSIS — I2699 Other pulmonary embolism without acute cor pulmonale: Secondary | ICD-10-CM

## 2013-07-03 DIAGNOSIS — Z7901 Long term (current) use of anticoagulants: Secondary | ICD-10-CM

## 2013-07-03 LAB — POCT INR: INR: 2.2

## 2013-07-10 ENCOUNTER — Ambulatory Visit (INDEPENDENT_AMBULATORY_CARE_PROVIDER_SITE_OTHER): Payer: BC Managed Care – PPO | Admitting: Internal Medicine

## 2013-07-10 ENCOUNTER — Encounter: Payer: Self-pay | Admitting: Internal Medicine

## 2013-07-10 VITALS — BP 130/78 | HR 80 | Temp 98.6°F | Ht 74.0 in | Wt >= 6400 oz

## 2013-07-10 DIAGNOSIS — F3289 Other specified depressive episodes: Secondary | ICD-10-CM

## 2013-07-10 DIAGNOSIS — F329 Major depressive disorder, single episode, unspecified: Secondary | ICD-10-CM

## 2013-07-10 DIAGNOSIS — I10 Essential (primary) hypertension: Secondary | ICD-10-CM

## 2013-07-10 DIAGNOSIS — E785 Hyperlipidemia, unspecified: Secondary | ICD-10-CM

## 2013-07-10 MED ORDER — ATORVASTATIN CALCIUM 80 MG PO TABS: 80.0000 mg | ORAL_TABLET | Freq: Every day | ORAL | Status: DC

## 2013-07-10 NOTE — Progress Notes (Signed)
Subjective:    Patient ID: [name], male    DOB: Nov 01, 1956, 57 y.o.   MRN: [id number]  HPI    Here to f/u, overall doing ok but zetia will no longer be covered under his insurance in sept when he transistions to Medicare; trying to be proactive about keeping the chol low;  Most recently LDL -  Lab Results  Component Value Date   LDLCALC 54 01/01/2013   Pt denies chest pain, increased sob or doe, wheezing, orthopnea, PND, increased LE swelling, palpitations, dizziness or syncope.  Pt denies new neurological symptoms such as new headache, or facial or extremity weakness or numbness  Denies worsening depressive symptoms, suicidal ideation, or panic.  Seeing Dr Everardo All for DM Past Medical History  Diagnosis Date  . Diabetes mellitus     type II  . Hyperlipidemia   . Hypertension   . DVT, lower extremity     right  . Embolus 2005    pulmonary  . Depression   . Obesity   . BACK PAIN   . COLONIC POLYPS, HX OF   . DEGENERATIVE JOINT DISEASE, KNEES, BILATERAL   . DEPRESSION   . DIVERTICULOSIS, COLON   . HYPOGONADISM, MALE   . HYPOTENSION, ORTHOSTATIC   . OBESITY, MORBID   . Long term current use of anticoagulant   . Obstructive sleep apnea     uses cpap setting 8 to 10  . Kidney stones   . Renal insufficiency   . RENAL INSUFFICIENCY   . Shortness of breath    Past Surgical History  Procedure Laterality Date  . Knee surgury  2000 & 2003     cartilage damage  . Ankle surgury  2001 bilateral    with bone spurs and torn tendon  . Edg  2008    chronic Duodenitis  . Tonsillectomy and adenoidectomy  age 54 or 80  . Total knee arthroplasty  01/10/2012    Procedure: TOTAL KNEE ARTHROPLASTY;  Surgeon: Shelda Pal, MD;  Location: [facility];  Service: Orthopedics;  Laterality: Left;  . Lumbar laminectomy/decompression microdiscectomy  09/28/2012    Procedure: LUMBAR LAMINECTOMY/DECOMPRESSION MICRODISCECTOMY 2 LEVELS;  Surgeon: Drucilla Schmidt, MD;  Location: [facility];  Service:  Orthopedics;  Laterality: Left;  Decompressive laminectomy L4-L5. Microdiscectomy L5-S1  . Heel spur surgery Bilateral 2005  . Replacement total knee  2013    reports that he quit smoking about 34 years ago. He does not have any smokeless tobacco history on file. He reports that  drinks alcohol. He reports that he does not use illicit drugs. family history includes Diabetes in his brother and sister; Hypertension in his mother; and Prostate cancer in his father. Allergies  Allergen Reactions  . Other     NO BLOOD -JEHOVAH'S WITNESS-SIGNED REFUSAL BUT WOULD TAKE ALBUMIN   Current Outpatient Prescriptions on File Prior to Visit  Medication Sig Dispense Refill  . albuterol (PROAIR HFA) 108 (90 BASE) MCG/ACT inhaler Inhale 2 puffs into the lungs every 4 (four) hours as needed. For shortness of breath      . aspirin (ECOTRIN LOW STRENGTH) 81 MG EC tablet Take 81 mg by mouth daily before breakfast.       . aspirin EC 81 MG tablet Take 81 mg by mouth every morning.      . BD PEN NEEDLE NANO U/F 32G X 4 MM MISC USE AS DIRECTED 4 TIMES A DAY  100 each  2  . DULoxetine (CYMBALTA) 60 MG  capsule TAKE 1 CAPSULE BY MOUTH EVERY DAY  90 capsule  2  . hydrochlorothiazide (HYDRODIURIL) 25 MG tablet Take 1 tablet (25 mg total) by mouth every morning.  90 tablet  3  . HYDROcodone-acetaminophen (NORCO/VICODIN) 5-325 MG per tablet Take 1-2 tablets by mouth every 6 (six) hours as needed. For pain  60 tablet  0  . insulin glargine (LANTUS) 100 UNIT/ML injection Inject 70 Units into the skin at bedtime.       . insulin lispro (HUMALOG) 100 unit/mL SOLN 3 times a day (just before each meal), 15-50-35 units, and pen needles 4/day.      . lisinopril (PRINIVIL,ZESTRIL) 5 MG tablet TAKE 1 BY MOUTH ONCE DAILY  90 tablet  3  . phentermine (ADIPEX-P) 37.5 MG tablet TAKE 1 TABLET BY MOUTH EVERY MORNING  30 tablet  2  . phentermine 37.5 MG capsule Take 1 capsule (37.5 mg total) by mouth every morning.  30 capsule  2  .  warfarin (COUMADIN) 5 MG tablet 90 day  180 tablet  0   No current facility-administered medications on file prior to visit.   Review of Systems  Constitutional: Negative for unexpected weight change, or unusual diaphoresis  HENT: Negative for tinnitus.   Eyes: Negative for photophobia and visual disturbance.  Respiratory: Negative for choking and stridor.   Gastrointestinal: Negative for vomiting and blood in stool.  Genitourinary: Negative for hematuria and decreased urine volume.  Musculoskeletal: Negative for acute joint swelling Skin: Negative for color change and wound.  Neurological: Negative for tremors and numbness other than noted  Psychiatric/Behavioral: Negative for decreased concentration or  hyperactivity.       Objective:   Physical Exam BP 130/78  Pulse 80  Temp(Src) 98.6 F (37 C) (Oral)  Ht 6\' 2"  (1.88 m)  Wt 401 lb 12 oz (182.233 kg)  BMI 51.56 kg/m2  SpO2 97% VS noted,  Constitutional: Pt appears well-developed and well-nourished./morbid obese  HENT: Head: NCAT.  Right Ear: External ear normal.  Left Ear: External ear normal.  Eyes: Conjunctivae and EOM are normal. Pupils are equal, round, and reactive to light.  Neck: Normal range of motion. Neck supple.  Cardiovascular: Normal rate and regular rhythm.   Pulmonary/Chest: Effort normal and breath sounds normal.  Neurological: Pt is alert. Not confused  Skin: Skin is warm. No erythema.  Psychiatric: Pt behavior is normal. Thought content normal. no depressed affect     Assessment & Plan:

## 2013-07-10 NOTE — Assessment & Plan Note (Signed)
Due to cost, ok to stop the zetia and simvastatin 80, to change to generic Lipitor 80, f/u at 3 mo

## 2013-07-10 NOTE — Patient Instructions (Signed)
OK to stop the zetia, and the simvastatin Please start the generic Lipitor at 80 mg per day Please continue all other medications as before, and refills have been done if requested. Please have the pharmacy call with any other refills you may need. Please continue your efforts at being more active, low cholesterol diet, and weight control. Please keep your appointments with your specialists as you have planned  - Dr Everardo All for sugar Please return in 3 months, or sooner if needed

## 2013-07-10 NOTE — Assessment & Plan Note (Signed)
stable overall by history and exam, recent data reviewed with pt, and pt to continue medical treatment as before,  to f/u any worsening symptoms or concerns BP Readings from Last 3 Encounters:  07/10/13 130/78  06/20/13 122/70  05/16/13 130/80

## 2013-07-10 NOTE — Assessment & Plan Note (Signed)
stable overall by history and exam, recent data reviewed with pt, and pt to continue medical treatment as before,  to f/u any worsening symptoms or concerns Lab Results  Component Value Date   WBC 8.3 09/26/2012   HGB 12.5* 09/26/2012   HCT 40.6 09/26/2012   PLT 237 09/26/2012   GLUCOSE 192* 01/01/2013   CHOL 112 01/01/2013   TRIG 114.0 01/01/2013   HDL 35.40* 01/01/2013   LDLDIRECT 121.2 05/25/2011   LDLCALC 54 01/01/2013   ALT 28 01/01/2013   AST 21 01/01/2013   NA 138 01/01/2013   K 4.3 01/01/2013   CL 99 01/01/2013   CREATININE 1.0 01/01/2013   BUN 20 01/01/2013   CO2 30 01/01/2013   TSH 2.94 06/14/2012   PSA 0.88 06/14/2012   INR 2.2 07/03/2013   HGBA1C 9.3* 05/16/2013   MICROALBUR 1.4 06/14/2012

## 2013-08-14 ENCOUNTER — Ambulatory Visit (INDEPENDENT_AMBULATORY_CARE_PROVIDER_SITE_OTHER): Payer: Medicare PPO | Admitting: General Practice

## 2013-08-14 DIAGNOSIS — I2699 Other pulmonary embolism without acute cor pulmonale: Secondary | ICD-10-CM

## 2013-08-14 LAB — POCT INR: INR: 2.4

## 2013-08-17 ENCOUNTER — Ambulatory Visit (INDEPENDENT_AMBULATORY_CARE_PROVIDER_SITE_OTHER): Payer: Medicare PPO | Admitting: Endocrinology

## 2013-08-17 VITALS — BP 136/80 | HR 76 | Ht 74.0 in | Wt >= 6400 oz

## 2013-08-17 DIAGNOSIS — E119 Type 2 diabetes mellitus without complications: Secondary | ICD-10-CM

## 2013-08-17 DIAGNOSIS — E1029 Type 1 diabetes mellitus with other diabetic kidney complication: Secondary | ICD-10-CM

## 2013-08-17 NOTE — Progress Notes (Signed)
Subjective:    Patient ID: [name], male    DOB: [birth year], 57 y.o.   MRN: [id number]  HPI Pt returns for f/u of insulin-requiring DM (dx'ed 1996; he has mild if any neuropathy of the lower extremities; he has associated renal insufficiency and autonomic neuropathy; he has never had severe hypoglycemia or DKA; he is working towards bariatric surgery).  He has changed his humalog to 3 times a day (just before each meal) 35-35-30 units.  no cbg record, but states cbg was mildly low once, at hs.  It is highest in the afternoon (high-100's).  pt states he feels well in general. Past Medical History  Diagnosis Date  . Diabetes mellitus     type II  . Hyperlipidemia   . Hypertension   . DVT, lower extremity     right  . Embolus 2005    pulmonary  . Depression   . Obesity   . BACK PAIN   . COLONIC POLYPS, HX OF   . DEGENERATIVE JOINT DISEASE, KNEES, BILATERAL   . DEPRESSION   . DIVERTICULOSIS, COLON   . HYPOGONADISM, MALE   . HYPOTENSION, ORTHOSTATIC   . OBESITY, MORBID   . Long term current use of anticoagulant   . Obstructive sleep apnea     uses cpap setting 8 to 10  . Kidney stones   . Renal insufficiency   . RENAL INSUFFICIENCY   . Shortness of breath     Past Surgical History  Procedure Laterality Date  . Knee surgury  2000 & 2003     cartilage damage  . Ankle surgury  2001 bilateral    with bone spurs and torn tendon  . Edg  2008    chronic Duodenitis  . Tonsillectomy and adenoidectomy  age 50 or 65  . Total knee arthroplasty  01/10/2012    Procedure: TOTAL KNEE ARTHROPLASTY;  Surgeon: Shelda Pal, MD;  Location: [facility];  Service: Orthopedics;  Laterality: Left;  . Lumbar laminectomy/decompression microdiscectomy  09/28/2012    Procedure: LUMBAR LAMINECTOMY/DECOMPRESSION MICRODISCECTOMY 2 LEVELS;  Surgeon: Drucilla Schmidt, MD;  Location: [facility];  Service: Orthopedics;  Laterality: Left;  Decompressive laminectomy L4-L5. Microdiscectomy L5-S1  . Heel spur  surgery Bilateral 2005  . Replacement total knee  2013    History   Social History  . Marital Status: Single    Spouse Name: N/A    Number of Children: 2  . Years of Education: N/A   Occupational History  . back at work at [employer] since Jan. 2010    Social History Main Topics  . Smoking status: Former Smoker -- 0.25 packs/day for 5 years    Quit date: 12/06/1978  . Smokeless tobacco: Not on file  . Alcohol Use: Yes     Comment: occasional  . Drug Use: No  . Sexual Activity: Not on file   Other Topics Concern  . Not on file   Social History Narrative   Daily caffeine use one per day    Current Outpatient Prescriptions on File Prior to Visit  Medication Sig Dispense Refill  . albuterol (PROAIR HFA) 108 (90 BASE) MCG/ACT inhaler Inhale 2 puffs into the lungs every 4 (four) hours as needed. For shortness of breath      . aspirin (ECOTRIN LOW STRENGTH) 81 MG EC tablet Take 81 mg by mouth daily before breakfast.       . aspirin EC 81 MG tablet Take 81 mg by mouth every  morning.      Marland Kitchen atorvastatin (LIPITOR) 80 MG tablet Take 1 tablet (80 mg total) by mouth daily.  90 tablet  3  . BD PEN NEEDLE NANO U/F 32G X 4 MM MISC USE AS DIRECTED 4 TIMES A DAY  100 each  2  . DULoxetine (CYMBALTA) 60 MG capsule TAKE 1 CAPSULE BY MOUTH EVERY DAY  90 capsule  2  . hydrochlorothiazide (HYDRODIURIL) 25 MG tablet Take 1 tablet (25 mg total) by mouth every morning.  90 tablet  3  . HYDROcodone-acetaminophen (NORCO/VICODIN) 5-325 MG per tablet Take 1-2 tablets by mouth every 6 (six) hours as needed. For pain  60 tablet  0  . insulin glargine (LANTUS) 100 UNIT/ML injection Inject 70 Units into the skin at bedtime.       . insulin lispro (HUMALOG) 100 unit/mL SOLN 3 times a day (just before each meal), 35-50-30 units, and pen needles 4/day.      . lisinopril (PRINIVIL,ZESTRIL) 5 MG tablet TAKE 1 BY MOUTH ONCE DAILY  90 tablet  3  . phentermine (ADIPEX-P) 37.5 MG tablet TAKE 1 TABLET BY  MOUTH EVERY MORNING  30 tablet  2  . phentermine 37.5 MG capsule Take 1 capsule (37.5 mg total) by mouth every morning.  30 capsule  2  . warfarin (COUMADIN) 5 MG tablet 90 day  180 tablet  0   No current facility-administered medications on file prior to visit.    Allergies  Allergen Reactions  . Other     NO BLOOD -JEHOVAH'S WITNESS-SIGNED REFUSAL BUT WOULD TAKE ALBUMIN   Family History  Problem Relation Age of Onset  . Hypertension Mother   . Prostate cancer Father   . Diabetes Sister     died with DM 2001/04/28, 2 more sisters with diabetes  . Diabetes Brother    BP 136/80  Pulse 76  Ht 6\' 2"  (1.88 m)  Wt 406 lb (184.16 kg)  BMI 52.11 kg/m2  SpO2 94%  Review of Systems Denies LOC and weight change    Objective:   Physical Exam VITAL SIGNS:  See vs page.   GENERAL: no distress.  Morbid obesity.      Assessment & Plan:  DM: This insulin regimen was chosen from multiple options, as it best matches his insulin to his changing requirements throughout the day.  The benefits of glycemic control must be weighed against the risks of hypoglycemia.   therapy limited by noncompliance with insulin dosing and cbg recording.  i'll do the best i can. Morbid obesity: this complicates the rx of DM.  Renal insufficiency: this increases the risk of hypoglycemia.

## 2013-08-17 NOTE — Patient Instructions (Addendum)
check your blood sugar twice a day.  vary the time of day when you check, between before the 3 meals, and at bedtime.  also check if you have symptoms of your blood sugar being too high or too low.  please keep a record of the readings and bring it to your next appointment here.  please call us sooner if your blood sugar goes below 70, or if you have a lot of readings over 200.   Please change the humalog to 3 times a day (just before each meal) 3 times a day (just before each meal), 35-50-30 units. Please continue the same lantus. blood tests are being requested for you today.  We'll contact you with results.  Please come back for a follow-up appointment in 1 month.

## 2013-08-26 ENCOUNTER — Other Ambulatory Visit: Payer: Self-pay | Admitting: Endocrinology

## 2013-08-27 ENCOUNTER — Telehealth: Payer: Self-pay | Admitting: Endocrinology

## 2013-08-27 MED ORDER — INSULIN PEN NEEDLE 32G X 4 MM MISC: Status: DC

## 2013-08-27 NOTE — Telephone Encounter (Signed)
rx sent to pharmacy

## 2013-08-30 ENCOUNTER — Other Ambulatory Visit: Payer: Self-pay | Admitting: General Practice

## 2013-08-30 ENCOUNTER — Other Ambulatory Visit: Payer: Self-pay

## 2013-08-30 MED ORDER — LISINOPRIL 5 MG PO TABS: 5.0000 mg | ORAL_TABLET | Freq: Every day | ORAL | Status: DC

## 2013-08-30 MED ORDER — GLUCOSE BLOOD VI STRP: ORAL_STRIP | Status: DC

## 2013-08-30 MED ORDER — WARFARIN SODIUM 5 MG PO TABS: ORAL_TABLET | ORAL | Status: DC

## 2013-08-30 MED ORDER — ATORVASTATIN CALCIUM 80 MG PO TABS: 80.0000 mg | ORAL_TABLET | Freq: Every day | ORAL | Status: DC

## 2013-08-30 MED ORDER — BD SWAB SINGLE USE REGULAR PADS: MEDICATED_PAD | Status: DC

## 2013-08-30 MED ORDER — HYDROCHLOROTHIAZIDE 25 MG PO TABS: 25.0000 mg | ORAL_TABLET | Freq: Every morning | ORAL | Status: DC

## 2013-08-30 MED ORDER — ACCU-CHEK SOFTCLIX LANCETS MISC: Status: DC

## 2013-08-30 MED ORDER — ACCU-CHEK AVIVA PLUS W/DEVICE KIT: 1.0000 | PACK | Freq: Every day | Status: DC

## 2013-09-11 ENCOUNTER — Ambulatory Visit (INDEPENDENT_AMBULATORY_CARE_PROVIDER_SITE_OTHER): Payer: Medicare PPO | Admitting: Internal Medicine

## 2013-09-11 ENCOUNTER — Emergency Department (HOSPITAL_COMMUNITY): Payer: Medicare HMO

## 2013-09-11 ENCOUNTER — Encounter: Payer: Self-pay | Admitting: Internal Medicine

## 2013-09-11 ENCOUNTER — Inpatient Hospital Stay (HOSPITAL_COMMUNITY)
Admission: EM | Admit: 2013-09-11 | Discharge: 2013-09-13 | DRG: 872 | Disposition: A | Discharge status: Home / Self Care | Payer: Medicare HMO | Attending: Internal Medicine | Admitting: Internal Medicine

## 2013-09-11 ENCOUNTER — Encounter (HOSPITAL_COMMUNITY): Payer: Self-pay | Admitting: *Deleted

## 2013-09-11 VITALS — BP 132/70 | HR 119 | Temp 101.2°F | Ht 74.0 in | Wt >= 6400 oz

## 2013-09-11 DIAGNOSIS — E291 Testicular hypofunction: Secondary | ICD-10-CM

## 2013-09-11 DIAGNOSIS — R509 Fever, unspecified: Secondary | ICD-10-CM

## 2013-09-11 DIAGNOSIS — G4733 Obstructive sleep apnea (adult) (pediatric): Secondary | ICD-10-CM

## 2013-09-11 DIAGNOSIS — F17200 Nicotine dependence, unspecified, uncomplicated: Secondary | ICD-10-CM

## 2013-09-11 DIAGNOSIS — M5416 Radiculopathy, lumbar region: Secondary | ICD-10-CM

## 2013-09-11 DIAGNOSIS — Z86711 Personal history of pulmonary embolism: Secondary | ICD-10-CM

## 2013-09-11 DIAGNOSIS — I951 Orthostatic hypotension: Secondary | ICD-10-CM

## 2013-09-11 DIAGNOSIS — Z86718 Personal history of other venous thrombosis and embolism: Secondary | ICD-10-CM

## 2013-09-11 DIAGNOSIS — F329 Major depressive disorder, single episode, unspecified: Secondary | ICD-10-CM | POA: Diagnosis present

## 2013-09-11 DIAGNOSIS — R0682 Tachypnea, not elsewhere classified: Secondary | ICD-10-CM

## 2013-09-11 DIAGNOSIS — R109 Unspecified abdominal pain: Secondary | ICD-10-CM

## 2013-09-11 DIAGNOSIS — I10 Essential (primary) hypertension: Secondary | ICD-10-CM

## 2013-09-11 DIAGNOSIS — A419 Sepsis, unspecified organism: Principal | ICD-10-CM

## 2013-09-11 DIAGNOSIS — R197 Diarrhea, unspecified: Secondary | ICD-10-CM

## 2013-09-11 DIAGNOSIS — N489 Disorder of penis, unspecified: Secondary | ICD-10-CM

## 2013-09-11 DIAGNOSIS — Z860100 Personal history of colon polyps, unspecified: Secondary | ICD-10-CM

## 2013-09-11 DIAGNOSIS — E785 Hyperlipidemia, unspecified: Secondary | ICD-10-CM

## 2013-09-11 DIAGNOSIS — R011 Cardiac murmur, unspecified: Secondary | ICD-10-CM

## 2013-09-11 DIAGNOSIS — I129 Hypertensive chronic kidney disease with stage 1 through stage 4 chronic kidney disease, or unspecified chronic kidney disease: Secondary | ICD-10-CM | POA: Diagnosis present

## 2013-09-11 DIAGNOSIS — Z91199 Patient's noncompliance with other medical treatment and regimen due to unspecified reason: Secondary | ICD-10-CM

## 2013-09-11 DIAGNOSIS — R000 Tachycardia, unspecified: Secondary | ICD-10-CM

## 2013-09-11 DIAGNOSIS — Z794 Long term (current) use of insulin: Secondary | ICD-10-CM

## 2013-09-11 DIAGNOSIS — Z96659 Presence of unspecified artificial knee joint: Secondary | ICD-10-CM

## 2013-09-11 DIAGNOSIS — R300 Dysuria: Secondary | ICD-10-CM

## 2013-09-11 DIAGNOSIS — F32A Depression, unspecified: Secondary | ICD-10-CM | POA: Diagnosis present

## 2013-09-11 DIAGNOSIS — Z23 Encounter for immunization: Secondary | ICD-10-CM

## 2013-09-11 DIAGNOSIS — N259 Disorder resulting from impaired renal tubular function, unspecified: Secondary | ICD-10-CM

## 2013-09-11 DIAGNOSIS — R42 Dizziness and giddiness: Secondary | ICD-10-CM

## 2013-09-11 DIAGNOSIS — Z01818 Encounter for other preprocedural examination: Secondary | ICD-10-CM

## 2013-09-11 DIAGNOSIS — Z0000 Encounter for general adult medical examination without abnormal findings: Secondary | ICD-10-CM

## 2013-09-11 DIAGNOSIS — R198 Other specified symptoms and signs involving the digestive system and abdomen: Secondary | ICD-10-CM

## 2013-09-11 DIAGNOSIS — N419 Inflammatory disease of prostate, unspecified: Secondary | ICD-10-CM

## 2013-09-11 DIAGNOSIS — R0602 Shortness of breath: Secondary | ICD-10-CM

## 2013-09-11 DIAGNOSIS — IMO0002 Reserved for concepts with insufficient information to code with codable children: Secondary | ICD-10-CM

## 2013-09-11 DIAGNOSIS — K5900 Constipation, unspecified: Secondary | ICD-10-CM

## 2013-09-11 DIAGNOSIS — M1710 Unilateral primary osteoarthritis, unspecified knee: Secondary | ICD-10-CM

## 2013-09-11 DIAGNOSIS — M549 Dorsalgia, unspecified: Secondary | ICD-10-CM

## 2013-09-11 DIAGNOSIS — E119 Type 2 diabetes mellitus without complications: Secondary | ICD-10-CM

## 2013-09-11 DIAGNOSIS — Z9119 Patient's noncompliance with other medical treatment and regimen: Secondary | ICD-10-CM

## 2013-09-11 DIAGNOSIS — Z79899 Other long term (current) drug therapy: Secondary | ICD-10-CM

## 2013-09-11 DIAGNOSIS — Z87891 Personal history of nicotine dependence: Secondary | ICD-10-CM

## 2013-09-11 DIAGNOSIS — A498 Other bacterial infections of unspecified site: Secondary | ICD-10-CM | POA: Diagnosis present

## 2013-09-11 DIAGNOSIS — K5730 Diverticulosis of large intestine without perforation or abscess without bleeding: Secondary | ICD-10-CM

## 2013-09-11 DIAGNOSIS — D72829 Elevated white blood cell count, unspecified: Secondary | ICD-10-CM | POA: Diagnosis present

## 2013-09-11 DIAGNOSIS — Z7901 Long term (current) use of anticoagulants: Secondary | ICD-10-CM

## 2013-09-11 DIAGNOSIS — N182 Chronic kidney disease, stage 2 (mild): Secondary | ICD-10-CM | POA: Diagnosis present

## 2013-09-11 DIAGNOSIS — Z6841 Body Mass Index (BMI) 40.0 and over, adult: Secondary | ICD-10-CM

## 2013-09-11 DIAGNOSIS — N390 Urinary tract infection, site not specified: Secondary | ICD-10-CM | POA: Diagnosis present

## 2013-09-11 DIAGNOSIS — F3289 Other specified depressive episodes: Secondary | ICD-10-CM

## 2013-09-11 DIAGNOSIS — I2699 Other pulmonary embolism without acute cor pulmonale: Secondary | ICD-10-CM

## 2013-09-11 DIAGNOSIS — E1029 Type 1 diabetes mellitus with other diabetic kidney complication: Secondary | ICD-10-CM

## 2013-09-11 DIAGNOSIS — R079 Chest pain, unspecified: Secondary | ICD-10-CM

## 2013-09-11 HISTORY — DX: Obstructive sleep apnea (adult) (pediatric): G47.33

## 2013-09-11 LAB — CBC WITH DIFFERENTIAL/PLATELET
Basophils Absolute: 0 10*3/uL (ref 0.0–0.1)
Eosinophils Absolute: 0 10*3/uL (ref 0.0–0.7)
Eosinophils Relative: 0 % (ref 0–5)
Lymphs Abs: 1.9 10*3/uL (ref 0.7–4.0)
MCH: 22.2 pg — ABNORMAL LOW (ref 26.0–34.0)
MCHC: 31.8 g/dL (ref 30.0–36.0)
MCV: 69.9 fL — ABNORMAL LOW (ref 78.0–100.0)
Monocytes Absolute: 1.3 10*3/uL — ABNORMAL HIGH (ref 0.1–1.0)
Neutrophils Relative %: 83 % — ABNORMAL HIGH (ref 43–77)
Platelets: 184 10*3/uL (ref 150–400)
RBC: 6.21 MIL/uL — ABNORMAL HIGH (ref 4.22–5.81)
RDW: 15.9 % — ABNORMAL HIGH (ref 11.5–15.5)

## 2013-09-11 LAB — POCT URINALYSIS DIPSTICK
Bilirubin, UA: NEGATIVE
Blood, UA: NEGATIVE
Glucose, UA: 2000
Nitrite, UA: NEGATIVE
Protein, UA: NEGATIVE
Spec Grav, UA: 1.02
Urobilinogen, UA: NEGATIVE
pH, UA: 6

## 2013-09-11 LAB — PROTIME-INR
INR: 2.26 — ABNORMAL HIGH (ref 0.00–1.49)
Prothrombin Time: 24.2 seconds — ABNORMAL HIGH (ref 11.6–15.2)

## 2013-09-11 LAB — URINE MICROSCOPIC-ADD ON

## 2013-09-11 LAB — COMPREHENSIVE METABOLIC PANEL
ALT: 26 U/L (ref 0–53)
AST: 23 U/L (ref 0–37)
Albumin: 3.8 g/dL (ref 3.5–5.2)
Alkaline Phosphatase: 99 U/L (ref 39–117)
CO2: 30 mEq/L (ref 19–32)
Calcium: 9.5 mg/dL (ref 8.4–10.5)
Chloride: 97 mEq/L (ref 96–112)
Creatinine, Ser: 1.23 mg/dL (ref 0.50–1.35)
GFR calc non Af Amer: 63 mL/min — ABNORMAL LOW (ref >90)
Potassium: 4.7 mEq/L (ref 3.5–5.1)
Sodium: 138 mEq/L (ref 135–145)
Total Bilirubin: 0.6 mg/dL (ref 0.3–1.2)

## 2013-09-11 LAB — LACTIC ACID, PLASMA: Lactic Acid, Venous: 3.1 mmol/L — ABNORMAL HIGH (ref 0.5–2.2)

## 2013-09-11 LAB — URINALYSIS, ROUTINE W REFLEX MICROSCOPIC
Bilirubin Urine: NEGATIVE
Glucose, UA: >1000 mg/dL — AB
Hgb urine dipstick: NEGATIVE
Leukocytes, UA: NEGATIVE
Protein, ur: NEGATIVE mg/dL
Urobilinogen, UA: 0.2 mg/dL (ref 0.0–1.0)

## 2013-09-11 LAB — PRO B NATRIURETIC PEPTIDE: Pro B Natriuretic peptide (BNP): 41.1 pg/mL (ref 0–125)

## 2013-09-11 MED ORDER — DEXTROSE 5 % IV SOLN
1.0000 g | INTRAVENOUS | Status: DC
  Administered 2013-09-12 (×2): 1 g via INTRAVENOUS
  Filled 2013-09-11 (×4): qty 10

## 2013-09-11 MED ORDER — IOHEXOL 350 MG/ML SOLN
100.0000 mL | Freq: Once | INTRAVENOUS | Status: AC | PRN
  Administered 2013-09-11: 100 mL via INTRAVENOUS

## 2013-09-11 MED ORDER — ACETAMINOPHEN 325 MG PO TABS
650.0000 mg | ORAL_TABLET | Freq: Four times a day (QID) | ORAL | Status: DC | PRN
  Administered 2013-09-11: 650 mg via ORAL

## 2013-09-11 NOTE — ED Notes (Signed)
Patient transported to CT 

## 2013-09-11 NOTE — Assessment & Plan Note (Signed)
?   exac of his orthostatic hypotension

## 2013-09-11 NOTE — Assessment & Plan Note (Addendum)
Suspect most likley related to UTI, but cant r/o endocarditis due to heart murmur (though murmur could be flow murmur related to tachycardia, or otherwise related to AV abnormality with know aortic arch mild dilation per echo 2006)  Note:  Total time for pt hx, exam, review of record with pt in the room, determination of diagnoses and plan for further eval and tx is > 40 min, with over 50% spent in coordination and counseling of patient

## 2013-09-11 NOTE — Progress Notes (Addendum)
Subjective:    Patient ID: [name], male    DOB: January 02, 1956, 57 y.o.   MRN: [id number]  HPI  Here for acute visit, brought by a friend with him to drive as he is not able; appears acutely ill;  States he was doing fine until about Noon today when developed dysuria, some frequency but Denies flank pain, hematuria.  Subseaquently has significant worse dizziness, fever, and generally weakness.  Slow to ambulate, appears tachypnic with exertion and rest, found to be tachycardic as well.   Also mentions gradually worsening LE edema bilat x 2-3 mo, with some improvement at night but comes back worse the next day.  Pt denies chest pain, wheezing, orthopnea, PND, or syncope. Past Medical History  Diagnosis Date  . Diabetes mellitus     type II  . Hyperlipidemia   . Hypertension   . DVT, lower extremity     right  . Embolus 2005    pulmonary  . Depression   . Obesity   . BACK PAIN   . COLONIC POLYPS, HX OF   . DEGENERATIVE JOINT DISEASE, KNEES, BILATERAL   . DEPRESSION   . DIVERTICULOSIS, COLON   . HYPOGONADISM, MALE   . HYPOTENSION, ORTHOSTATIC   . OBESITY, MORBID   . Long term current use of anticoagulant   . Obstructive sleep apnea     uses cpap setting 8 to 10  . Kidney stones   . Renal insufficiency   . RENAL INSUFFICIENCY   . Shortness of breath    Past Surgical History  Procedure Laterality Date  . Knee surgury  2000 & 2003     cartilage damage  . Ankle surgury  2001 bilateral    with bone spurs and torn tendon  . Edg  2008    chronic Duodenitis  . Tonsillectomy and adenoidectomy  age 68 or 13  . Total knee arthroplasty  01/10/2012    Procedure: TOTAL KNEE ARTHROPLASTY;  Surgeon: Shelda Pal, MD;  Location: [facility];  Service: Orthopedics;  Laterality: Left;  . Lumbar laminectomy/decompression microdiscectomy  09/28/2012    Procedure: LUMBAR LAMINECTOMY/DECOMPRESSION MICRODISCECTOMY 2 LEVELS;  Surgeon: Drucilla Schmidt, MD;  Location: [facility];  Service: Orthopedics;   Laterality: Left;  Decompressive laminectomy L4-L5. Microdiscectomy L5-S1  . Heel spur surgery Bilateral 2005  . Replacement total knee  2013    reports that he quit smoking about 34 years ago. He does not have any smokeless tobacco history on file. He reports that  drinks alcohol. He reports that he does not use illicit drugs. family history includes Diabetes in his brother and sister; Hypertension in his mother; Prostate cancer in his father. Allergies  Allergen Reactions  . Other     NO BLOOD -JEHOVAH'S WITNESS-SIGNED REFUSAL BUT WOULD TAKE ALBUMIN   Current Outpatient Prescriptions on File Prior to Visit  Medication Sig Dispense Refill  . ACCU-CHEK SOFTCLIX LANCETS lancets Use as directed four times daily to check blood sugar.  Diagnosis code 250.00  300 each  3  . albuterol (PROAIR HFA) 108 (90 BASE) MCG/ACT inhaler Inhale 2 puffs into the lungs every 4 (four) hours as needed. For shortness of breath      . Alcohol Swabs (B-D SINGLE USE SWABS REGULAR) PADS Use as directed  200 each  3  . aspirin (ECOTRIN LOW STRENGTH) 81 MG EC tablet Take 81 mg by mouth daily before breakfast.       . aspirin EC 81 MG tablet Take 81  mg by mouth every morning.      Marland Kitchen atorvastatin (LIPITOR) 80 MG tablet Take 1 tablet (80 mg total) by mouth daily.  90 tablet  3  . Blood Glucose Monitoring Suppl (ACCU-CHEK AVIVA PLUS) W/DEVICE KIT 1 each by Other route daily. Use as directed to check blood sugar.  1 kit  0  . DULoxetine (CYMBALTA) 60 MG capsule TAKE 1 CAPSULE BY MOUTH EVERY DAY  90 capsule  2  . glucose blood (ACCU-CHEK AVIVA PLUS) test strip Use as directed four times daily to check blood sugar.  Diagnosis code 250.00  300 each  3  . hydrochlorothiazide (HYDRODIURIL) 25 MG tablet Take 1 tablet (25 mg total) by mouth every morning.  90 tablet  3  . HYDROcodone-acetaminophen (NORCO/VICODIN) 5-325 MG per tablet Take 1-2 tablets by mouth every 6 (six) hours as needed. For pain  60 tablet  0  . insulin glargine  (LANTUS) 100 UNIT/ML injection Inject 70 Units into the skin at bedtime.       . insulin lispro (HUMALOG) 100 unit/mL SOLN 3 times a day (just before each meal), 35-50-30 units, and pen needles 4/day.      . Insulin Pen Needle (BD PEN NEEDLE NANO U/F) 32G X 4 MM MISC USE AS DIRECTED 4 TIMES A DAY  100 each  2  . lisinopril (PRINIVIL,ZESTRIL) 5 MG tablet Take 1 tablet (5 mg total) by mouth daily.  90 tablet  3  . phentermine (ADIPEX-P) 37.5 MG tablet TAKE 1 TABLET BY MOUTH EVERY MORNING  30 tablet  2  . phentermine 37.5 MG capsule Take 1 capsule (37.5 mg total) by mouth every morning.  30 capsule  2  . warfarin (COUMADIN) 5 MG tablet Take as directed by anticoagulation clinic  180 tablet  0   No current facility-administered medications on file prior to visit.   Review of Systems  Constitutional: Negative for unexpected weight change HENT: Negative for tinnitus.   Eyes: Negative for photophobia and visual disturbance.  Respiratory: Negative for choking and stridor.   Gastrointestinal: Negative for vomiting and blood in stool.  Genitourinary: Negative for hematuria and decreased urine volume.  Musculoskeletal: Negative for acute joint swelling Skin: Negative for color change and wound.  Neurological: Negative for tremors and numbness other than noted  Psychiatric/Behavioral: Negative for decreased concentration or  hyperactivity.       Objective:   Physical Exam BP 132/70  Pulse 119  Temp(Src) 101.2 F (38.4 C) (Oral)  Ht 6\' 2"  (1.88 m)  Wt 409 lb (185.521 kg)  BMI 52.49 kg/m2  SpO2 92% VS noted, acute ill, at last moderate Constitutional: Pt appears well-developed and well-nourished. /morbid obese HENT: Head: NCAT.  Right Ear: External ear normal.  Left Ear: External ear normal.  Eyes: Conjunctivae and EOM are normal. Pupils are equal, round, and reactive to light.  Neck: Normal range of motion. Neck supple.  Cardiovascular: tachy, with gr 3-4/6  sys murmur RUSB,  and regular  rhythm.   Pulmonary/Chest: Effort normal and breath sounds decreased, but quick short breaths consistently  Abd:  Soft, NT, non-distended, + BS, no flank tender Neurological: Pt is alert. Not confused , motor 5/5 Skin: Skin is warm. No erythema. LE edema 2+ to above knees bilat Psychiatric: Pt behavior is normal. Thought content normal.     Assessment & Plan:  1. Tachycardia - ? Related to fever or other 2. Tachypnea - hx of PE - cant r/o recurrence vs early sepsis 3.  Dizziness - ? exac of his orthostatic hypotension vs other 4   Heart Murmur- loud gr 3-4/6 RUSB (new for him) - ? Flow related to tachycardia vs other 5. Hx of aortic arch dilation by echo 2006 6.  Dysuria - prob UTI 7  Dm - ? Control 8  Gradually worsening Bilat LE edema x 2-3 mo  Given his multiple issues, I have advised further eval and tx in the ER, Has UTI but appears ill, for r/o sepsis, consider r/o AV endocarditis and/or worsening aortic arch dilation, and better sugar control, volume control.

## 2013-09-11 NOTE — Assessment & Plan Note (Signed)
Hx of PE, ? Primary or secondary, should likely have imaging in the ER for further eval

## 2013-09-11 NOTE — Assessment & Plan Note (Signed)
Likely secondary but ? Due to sepsis, fever, or other - for further eval in ER

## 2013-09-11 NOTE — ED Provider Notes (Signed)
CSN: [id number]     Arrival date & time 09/11/13  04-28-07 History   First MD Initiated Contact with Patient 09/11/13 2015/04/28     Chief Complaint  Patient presents with  . Dizziness  . Urinary Tract Infection  . Fever  . Shortness of Breath   (Consider location/radiation/quality/duration/timing/severity/associated sxs/prior Treatment) Patient is a 57 y.o. male presenting with urinary tract infection, fever, and shortness of breath. The history is provided by the patient.  Urinary Tract Infection Associated symptoms include shortness of breath. Pertinent negatives include no chest pain, no abdominal pain and no headaches.  Fever Associated symptoms: dysuria   Associated symptoms: no chest pain, no diarrhea, no headaches, no nausea, no rash and no vomiting   Shortness of Breath Associated symptoms: fever   Associated symptoms: no abdominal pain, no chest pain, no headaches, no rash and no vomiting    patient developed fever or shortness of breath today. Also has had dysuria. He has seen by his primary care doctor sent in for further treatment. No nausea vomiting. He states he feels fatigued. No diarrhea. No cough. No sick contacts. He is on Coumadin for previous pulmonary embolisms. He was found to have a possibly new cardiac murmur.  Past Medical History  Diagnosis Date  . Diabetes mellitus     type II  . Hyperlipidemia   . Hypertension   . DVT, lower extremity     right  . Embolus 2005    pulmonary  . Depression   . Obesity   . BACK PAIN   . COLONIC POLYPS, HX OF   . DEGENERATIVE JOINT DISEASE, KNEES, BILATERAL   . DEPRESSION   . DIVERTICULOSIS, COLON   . HYPOGONADISM, MALE   . HYPOTENSION, ORTHOSTATIC   . OBESITY, MORBID   . Long term current use of anticoagulant   . Obstructive sleep apnea     uses cpap setting 8 to 10  . Kidney stones   . Renal insufficiency   . RENAL INSUFFICIENCY   . Shortness of breath    Past Surgical History  Procedure Laterality Date  . Knee  surgury  28-Apr-1999 & 04-27-2002     cartilage damage  . Ankle surgury  2001 bilateral    with bone spurs and torn tendon  . Edg  28-Apr-2007    chronic Duodenitis  . Tonsillectomy and adenoidectomy  age 60 or 45  . Total knee arthroplasty  01/10/2012    Procedure: TOTAL KNEE ARTHROPLASTY;  Surgeon: Shelda Pal, MD;  Location: [facility];  Service: Orthopedics;  Laterality: Left;  . Lumbar laminectomy/decompression microdiscectomy  09/28/2012    Procedure: LUMBAR LAMINECTOMY/DECOMPRESSION MICRODISCECTOMY 2 LEVELS;  Surgeon: Drucilla Schmidt, MD;  Location: [facility];  Service: Orthopedics;  Laterality: Left;  Decompressive laminectomy L4-L5. Microdiscectomy L5-S1  . Heel spur surgery Bilateral 04-27-04  . Replacement total knee  2012/04/27   Family History  Problem Relation Age of Onset  . Hypertension Mother   . Prostate cancer Father   . Diabetes Sister     died with DM 27-Apr-2001, 2 more sisters with diabetes  . Diabetes Brother    History  Substance Use Topics  . Smoking status: Former Smoker -- 0.25 packs/day for 5 years    Quit date: 12/06/1978  . Smokeless tobacco: Not on file  . Alcohol Use: Yes     Comment: occasional    Review of Systems  Constitutional: Positive for fever, appetite change and fatigue. Negative for activity change.  HENT: Negative  for neck stiffness.   Eyes: Negative for pain.  Respiratory: Positive for shortness of breath. Negative for chest tightness.   Cardiovascular: Positive for leg swelling. Negative for chest pain.  Gastrointestinal: Negative for nausea, vomiting, abdominal pain and diarrhea.  Genitourinary: Positive for dysuria. Negative for flank pain.  Musculoskeletal: Negative for back pain.  Skin: Negative for rash.  Neurological: Negative for weakness, numbness and headaches.  Psychiatric/Behavioral: Negative for behavioral problems.    Allergies  Other  Home Medications   Current Outpatient Rx  Name  Route  Sig  Dispense  Refill  . albuterol (PROAIR HFA) 108 (90  BASE) MCG/ACT inhaler   Inhalation   Inhale 2 puffs into the lungs every 4 (four) hours as needed. For shortness of breath         . aspirin (ECOTRIN LOW STRENGTH) 81 MG EC tablet   Oral   Take 81 mg by mouth daily before breakfast.          . atorvastatin (LIPITOR) 80 MG tablet   Oral   Take 80 mg by mouth daily.         . DULoxetine (CYMBALTA) 60 MG capsule   Oral   Take 60 mg by mouth daily.         . hydrochlorothiazide (HYDRODIURIL) 25 MG tablet   Oral   Take 25 mg by mouth every morning.         Marland Kitchen HYDROcodone-acetaminophen (NORCO/VICODIN) 5-325 MG per tablet   Oral   Take 1-2 tablets by mouth every 6 (six) hours as needed. For pain         . insulin glargine (LANTUS) 100 UNIT/ML injection   Subcutaneous   Inject 70 Units into the skin at bedtime.          . insulin lispro (HUMALOG) 100 unit/mL SOLN   Subcutaneous   Inject 30-50 Units into the skin 3 (three) times daily with meals. 35 units in the morning, 50 units at lunch, and 30 units at supper just before a meal         . lisinopril (PRINIVIL,ZESTRIL) 5 MG tablet   Oral   Take 5 mg by mouth daily.         . phentermine (ADIPEX-P) 37.5 MG tablet   Oral   Take 37.5 mg by mouth daily before breakfast.         . warfarin (COUMADIN) 5 MG tablet   Oral   Take 10 mg by mouth daily.         Marland Kitchen ACCU-CHEK SOFTCLIX LANCETS lancets      Use as directed four times daily to check blood sugar.  Diagnosis code 250.00   300 each   3   . Alcohol Swabs (B-D SINGLE USE SWABS REGULAR) PADS      Use as directed   200 each   3   . Blood Glucose Monitoring Suppl (ACCU-CHEK AVIVA PLUS) W/DEVICE KIT   Other   1 each by Other route daily. Use as directed to check blood sugar.   1 kit   0   . glucose blood (ACCU-CHEK AVIVA PLUS) test strip      Use as directed four times daily to check blood sugar.  Diagnosis code 250.00   300 each   3   . Insulin Pen Needle (BD PEN NEEDLE NANO U/F) 32G X 4 MM  MISC      USE AS DIRECTED 4 TIMES A DAY   100 each  2    BP 122/57  Pulse 89  Temp(Src) 99.3 F (37.4 C) (Oral)  Resp 30  Ht 6\' 2"  (1.88 m)  Wt 409 lb (185.521 kg)  BMI 52.49 kg/m2  SpO2 93% Physical Exam  Nursing note and vitals reviewed. Constitutional: He is oriented to person, place, and time. He appears well-developed and well-nourished.  HENT:  Head: Normocephalic and atraumatic.  Eyes: EOM are normal. Pupils are equal, round, and reactive to light.  Neck: Normal range of motion. Neck supple.  Cardiovascular: Normal rate and regular rhythm.   Murmur heard. Pulmonary/Chest: Effort normal and breath sounds normal.  Tachypnea  Abdominal: Soft. Bowel sounds are normal. He exhibits no distension and no mass. There is no tenderness. There is no rebound and no guarding.  Musculoskeletal: Normal range of motion. He exhibits edema.  Bilateral lower extremity pitting edema  Neurological: He is alert and oriented to person, place, and time. No cranial nerve deficit.  Skin: Skin is warm and dry.  Psychiatric: He has a normal mood and affect.    ED Course  Procedures (including critical care time) Labs Review Labs Reviewed  LACTIC ACID, PLASMA - Abnormal; Notable for the following:    Lactic Acid, Venous 3.1 (*)    All other components within normal limits  COMPREHENSIVE METABOLIC PANEL - Abnormal; Notable for the following:    Glucose, Bld 399 (*)    GFR calc non Af Amer 63 (*)    GFR calc Af Amer 73 (*)    All other components within normal limits  CBC WITH DIFFERENTIAL - Abnormal; Notable for the following:    WBC 18.6 (*)    RBC 6.21 (*)    MCV 69.9 (*)    MCH 22.2 (*)    RDW 15.9 (*)    Neutrophils Relative % 83 (*)    Lymphocytes Relative 10 (*)    Neutro Abs 15.4 (*)    Monocytes Absolute 1.3 (*)    All other components within normal limits  URINALYSIS, ROUTINE W REFLEX MICROSCOPIC - Abnormal; Notable for the following:    Specific Gravity, Urine 1.031 (*)     Glucose, UA >1000 (*)    All other components within normal limits  PROTIME-INR - Abnormal; Notable for the following:    Prothrombin Time 24.2 (*)    INR 2.26 (*)    All other components within normal limits  CULTURE, BLOOD (ROUTINE X 2)  CULTURE, BLOOD (ROUTINE X 2)  PRO B NATRIURETIC PEPTIDE  URINE MICROSCOPIC-ADD ON   Imaging Review Dg Chest 2 View  09/11/2013   CLINICAL DATA:  Dizziness, fever, shortness of breath  EXAM: CHEST  2 VIEW  COMPARISON:  Prior chest x-ray 01/04/2012  FINDINGS: Stable borderline cardiomegaly and prominence of the mediastinum. Atherosclerotic calcifications noted in the transverse aorta. Slightly increased diffuse prominence of the interstitial markings. Inspiratory volumes are low. The lateral view is limited by patient or respiratory motion. No focal airspace consolidation, pleural effusion or pneumothorax. No acute osseous abnormality.  IMPRESSION: Slightly increased mild interstitial prominence throughout both lungs. Findings may represent pulmonary vascular congestion and early interstitial edema, or potentially atypical/viral respiratory infection.   Electronically Signed   By: Malachy Moan M.D.   On: 09/11/2013 20:31   Ct Angio Chest W/cm &/or Wo Cm  09/11/2013   CLINICAL DATA:  Dizziness, fever, dysuria, weakness and shortness of breath. Bilateral leg swelling.  EXAM: CT ANGIOGRAPHY CHEST WITH CONTRAST  TECHNIQUE: Multidetector CT imaging of the chest  was performed using the standard protocol during bolus administration of intravenous contrast. Multiplanar CT image reconstructions including MIPs were obtained to evaluate the vascular anatomy.  CONTRAST:  OMNIPAQUE IOHEXOL 350 MG/ML SOLN  COMPARISON:  Chest radiograph performed earlier today at 8:10 p.m., and CTA chest from 07/27/2005  FINDINGS: There is no evidence of central pulmonary embolus. Evaluation is suboptimal due to motion artifact.  Hazy bilateral densities are thought to reflect  atelectasis, with a mildly mosaic pattern of parenchymal attenuation. There is no evidence of significant focal consolidation, pleural effusion or pneumothorax. No masses are identified; no abnormal focal contrast enhancement is seen.  Scattered coronary artery calcifications are seen. The mediastinum is otherwise unremarkable in appearance. No mediastinal lymphadenopathy is seen. No pericardial effusion is identified. The great vessels are grossly unremarkable in appearance. No axillary lymphadenopathy is seen. The visualized portions of the thyroid gland are unremarkable in appearance.  The visualized portions of the liver and spleen are unremarkable.  No acute osseous abnormalities are seen.  Review of the MIP images confirms the above findings.  IMPRESSION: 1. No evidence of central pulmonary embolus. 2. Mild bilateral atelectasis noted, with a mildly mosaic pattern of parenchymal attenuation. Lungs otherwise clear. 3. Scattered coronary artery calcifications seen.   Electronically Signed   By: Roanna Raider M.D.   On: 09/11/2013 22:26    MDM   1. Fever, unspecified   2. Tachypnea   3. Tachycardia   4. Murmur, cardiac    Patient with fevers. Tachypnea. Urinalysis does not show infection. CT angiography the chest does not show pulmonary embolism or pneumonia. Patient does have a murmur that may be new. Will be admitted to internal medicine.   Juliet Rude. Rubin Payor, MD 09/11/13 [phone]

## 2013-09-11 NOTE — Patient Instructions (Signed)
It appears that you do have a possible Urinary tract infection today, but given all of you other issues I think you should go now to the ER for further evaluation, and possibly hospitalization  Please continue all other medications as before, for now; no treatment or new prescriptions given today

## 2013-09-11 NOTE — ED Notes (Addendum)
Here from Dr. Raphael Gibney office (PCP), here for fever, dizziness, sob and UTI sx, also leg swelling, sx onset 0100, felt fine prior to that, pt with increased wob. Sx gradually progressively worse throughout the day, denies pain except for dysuria, also frequency & urgency. Denies nvd. Here with wife. Ambulatory, alert, NAD, calm, tachypneic. Pedal edema noted, LS diminished.

## 2013-09-11 NOTE — ED Notes (Signed)
Patient returned from CT

## 2013-09-11 NOTE — Assessment & Plan Note (Signed)
prob uti, cant r/o rapidly worsening urosepsis with fever, tachycardia, tachypnia, dizziness, acutely ill appaering though BP appears adequate; will hold on specific tx despite urine dip with 1+ leu est, and refer to ER for further eval and tx

## 2013-09-12 ENCOUNTER — Other Ambulatory Visit: Payer: BC Managed Care – PPO

## 2013-09-12 ENCOUNTER — Encounter (HOSPITAL_COMMUNITY): Payer: Self-pay | Admitting: General Practice

## 2013-09-12 DIAGNOSIS — A419 Sepsis, unspecified organism: Secondary | ICD-10-CM | POA: Diagnosis present

## 2013-09-12 DIAGNOSIS — I2699 Other pulmonary embolism without acute cor pulmonale: Secondary | ICD-10-CM

## 2013-09-12 DIAGNOSIS — R011 Cardiac murmur, unspecified: Secondary | ICD-10-CM

## 2013-09-12 DIAGNOSIS — R109 Unspecified abdominal pain: Secondary | ICD-10-CM

## 2013-09-12 DIAGNOSIS — G4733 Obstructive sleep apnea (adult) (pediatric): Secondary | ICD-10-CM

## 2013-09-12 LAB — URINE MICROSCOPIC-ADD ON

## 2013-09-12 LAB — URINALYSIS, ROUTINE W REFLEX MICROSCOPIC
Bilirubin Urine: NEGATIVE
Ketones, ur: NEGATIVE mg/dL
Nitrite: NEGATIVE
Protein, ur: NEGATIVE mg/dL
Specific Gravity, Urine: 1.039 — ABNORMAL HIGH (ref 1.005–1.030)
Urobilinogen, UA: 0.2 mg/dL (ref 0.0–1.0)
pH: 5 (ref 5.0–8.0)

## 2013-09-12 LAB — GLUCOSE, CAPILLARY
Glucose-Capillary: 224 mg/dL — ABNORMAL HIGH (ref 70–99)
Glucose-Capillary: 180 mg/dL — ABNORMAL HIGH (ref 70–99)
Glucose-Capillary: 118 mg/dL — ABNORMAL HIGH (ref 70–99)

## 2013-09-12 LAB — BASIC METABOLIC PANEL
BUN: 17 mg/dL (ref 6–23)
Calcium: 9 mg/dL (ref 8.4–10.5)
Chloride: 97 mEq/L (ref 96–112)
GFR calc non Af Amer: 76 mL/min — ABNORMAL LOW (ref >90)
Glucose, Bld: 225 mg/dL — ABNORMAL HIGH (ref 70–99)
Potassium: 3.8 mEq/L (ref 3.5–5.1)
Sodium: 135 mEq/L (ref 135–145)

## 2013-09-12 LAB — CBC
MCH: 22.2 pg — ABNORMAL LOW (ref 26.0–34.0)
MCHC: 32.2 g/dL (ref 30.0–36.0)
MCV: 69.1 fL — ABNORMAL LOW (ref 78.0–100.0)
Platelets: 169 10*3/uL (ref 150–400)
RBC: 5.85 MIL/uL — ABNORMAL HIGH (ref 4.22–5.81)
RDW: 15.8 % — ABNORMAL HIGH (ref 11.5–15.5)

## 2013-09-12 LAB — MRSA PCR SCREENING: MRSA by PCR: POSITIVE — AB

## 2013-09-12 MED ORDER — INSULIN ASPART 100 UNIT/ML ~~LOC~~ SOLN
30.0000 [IU] | Freq: Every day | SUBCUTANEOUS | Status: DC
  Administered 2013-09-12: 30 [IU] via SUBCUTANEOUS

## 2013-09-12 MED ORDER — CHLORHEXIDINE GLUCONATE CLOTH 2 % EX PADS
6.0000 | MEDICATED_PAD | Freq: Every day | CUTANEOUS | Status: DC
  Administered 2013-09-12 – 2013-09-13 (×2): 6 via TOPICAL

## 2013-09-12 MED ORDER — SODIUM CHLORIDE 0.9 % IJ SOLN
3.0000 mL | Freq: Two times a day (BID) | INTRAMUSCULAR | Status: DC
  Administered 2013-09-12 – 2013-09-13 (×4): 3 mL via INTRAVENOUS

## 2013-09-12 MED ORDER — HYDROCHLOROTHIAZIDE 25 MG PO TABS
25.0000 mg | ORAL_TABLET | Freq: Every morning | ORAL | Status: DC
  Administered 2013-09-12 – 2013-09-13 (×2): 25 mg via ORAL
  Filled 2013-09-12 (×2): qty 1

## 2013-09-12 MED ORDER — INSULIN GLARGINE 100 UNIT/ML ~~LOC~~ SOLN
70.0000 [IU] | Freq: Every day | SUBCUTANEOUS | Status: DC
  Administered 2013-09-12 (×2): 70 [IU] via SUBCUTANEOUS
  Filled 2013-09-12 (×3): qty 0.7

## 2013-09-12 MED ORDER — MUPIROCIN 2 % EX OINT
1.0000 "application " | TOPICAL_OINTMENT | Freq: Two times a day (BID) | CUTANEOUS | Status: DC
  Administered 2013-09-12 – 2013-09-13 (×3): 1 via NASAL
  Filled 2013-09-12 (×2): qty 22

## 2013-09-12 MED ORDER — INSULIN ASPART 100 UNIT/ML ~~LOC~~ SOLN
35.0000 [IU] | Freq: Every day | SUBCUTANEOUS | Status: DC
  Administered 2013-09-12 – 2013-09-13 (×2): 35 [IU] via SUBCUTANEOUS

## 2013-09-12 MED ORDER — INFLUENZA VAC SPLIT QUAD 0.5 ML IM SUSP
0.5000 mL | INTRAMUSCULAR | Status: AC
  Administered 2013-09-13: 0.5 mL via INTRAMUSCULAR
  Filled 2013-09-12: qty 0.5

## 2013-09-12 MED ORDER — HYDROCODONE-ACETAMINOPHEN 5-325 MG PO TABS
1.0000 | ORAL_TABLET | Freq: Four times a day (QID) | ORAL | Status: DC | PRN
  Administered 2013-09-13: 1 via ORAL
  Filled 2013-09-12: qty 1

## 2013-09-12 MED ORDER — WARFARIN - PHARMACIST DOSING INPATIENT: Freq: Every day | Status: DC

## 2013-09-12 MED ORDER — ACETAMINOPHEN 325 MG PO TABS
650.0000 mg | ORAL_TABLET | Freq: Four times a day (QID) | ORAL | Status: DC | PRN
  Administered 2013-09-12 – 2013-09-13 (×2): 650 mg via ORAL
  Filled 2013-09-12 (×2): qty 2

## 2013-09-12 MED ORDER — ALBUTEROL SULFATE (5 MG/ML) 0.5% IN NEBU: 2.5000 mg | INHALATION_SOLUTION | RESPIRATORY_TRACT | Status: DC | PRN

## 2013-09-12 MED ORDER — ONDANSETRON HCL 4 MG/2ML IJ SOLN
4.0000 mg | Freq: Four times a day (QID) | INTRAMUSCULAR | Status: DC | PRN
  Administered 2013-09-12: 4 mg via INTRAVENOUS
  Filled 2013-09-12: qty 2

## 2013-09-12 MED ORDER — INSULIN ASPART 100 UNIT/ML ~~LOC~~ SOLN
0.0000 [IU] | Freq: Three times a day (TID) | SUBCUTANEOUS | Status: DC
  Administered 2013-09-12: 5 [IU] via SUBCUTANEOUS
  Administered 2013-09-12: 3 [IU] via SUBCUTANEOUS
  Administered 2013-09-13: 2 [IU] via SUBCUTANEOUS

## 2013-09-12 MED ORDER — ALBUTEROL SULFATE HFA 108 (90 BASE) MCG/ACT IN AERS: 2.0000 | INHALATION_SPRAY | RESPIRATORY_TRACT | Status: DC | PRN

## 2013-09-12 MED ORDER — WARFARIN SODIUM 10 MG PO TABS
10.0000 mg | ORAL_TABLET | Freq: Every day | ORAL | Status: DC
  Administered 2013-09-12: 10 mg via ORAL
  Filled 2013-09-12 (×2): qty 1

## 2013-09-12 MED ORDER — ACETAMINOPHEN 650 MG RE SUPP: 650.0000 mg | Freq: Four times a day (QID) | RECTAL | Status: DC | PRN

## 2013-09-12 MED ORDER — INSULIN LISPRO 100 UNIT/ML (KWIKPEN): 30.0000 [IU] | PEN_INJECTOR | Freq: Three times a day (TID) | SUBCUTANEOUS | Status: DC

## 2013-09-12 MED ORDER — INSULIN ASPART 100 UNIT/ML ~~LOC~~ SOLN
50.0000 [IU] | Freq: Every day | SUBCUTANEOUS | Status: DC
  Administered 2013-09-12 – 2013-09-13 (×2): 50 [IU] via SUBCUTANEOUS

## 2013-09-12 MED ORDER — ATORVASTATIN CALCIUM 80 MG PO TABS
80.0000 mg | ORAL_TABLET | Freq: Every day | ORAL | Status: DC
  Administered 2013-09-12 – 2013-09-13 (×2): 80 mg via ORAL
  Filled 2013-09-12 (×2): qty 1

## 2013-09-12 MED ORDER — SODIUM CHLORIDE 0.9 % IV SOLN
INTRAVENOUS | Status: AC
  Administered 2013-09-12: 03:00:00 via INTRAVENOUS

## 2013-09-12 MED ORDER — DULOXETINE HCL 60 MG PO CPEP
60.0000 mg | ORAL_CAPSULE | Freq: Every day | ORAL | Status: DC
  Administered 2013-09-12 – 2013-09-13 (×2): 60 mg via ORAL
  Filled 2013-09-12 (×2): qty 1

## 2013-09-12 MED ORDER — LISINOPRIL 5 MG PO TABS
5.0000 mg | ORAL_TABLET | Freq: Every day | ORAL | Status: DC
  Administered 2013-09-12 – 2013-09-13 (×2): 5 mg via ORAL
  Filled 2013-09-12 (×2): qty 1

## 2013-09-12 MED ORDER — WARFARIN - PHYSICIAN DOSING INPATIENT: Freq: Every day | Status: DC

## 2013-09-12 MED ORDER — ASPIRIN EC 81 MG PO TBEC
81.0000 mg | DELAYED_RELEASE_TABLET | Freq: Every day | ORAL | Status: DC
  Administered 2013-09-12 – 2013-09-13 (×2): 81 mg via ORAL
  Filled 2013-09-12 (×3): qty 1

## 2013-09-12 MED ORDER — INSULIN ASPART 100 UNIT/ML ~~LOC~~ SOLN: 0.0000 [IU] | Freq: Every day | SUBCUTANEOUS | Status: DC

## 2013-09-12 MED ORDER — ONDANSETRON HCL 4 MG PO TABS: 4.0000 mg | ORAL_TABLET | Freq: Four times a day (QID) | ORAL | Status: DC | PRN

## 2013-09-12 NOTE — H&P (Signed)
Patient's PCP: Oliver Barre, MD  Chief Complaint: Fever and dysuria  History of Present Illness: [name] is a 57 y.o. African American male with history of type 2 diabetes, hypertension, hyperlipidemia, DVT and pulmonary embolism on chronic anticoagulation, depression, morbid obesity, chronic back pain, obstructive sleep apnea with noncompliance with CPAP, and chronic kidney disease stage II who presents with the above complaints.  Patient reported that his symptoms started this morning at 1 a.m. with dysuria.  Later on during the day he noted that he had dizziness and shortness of breath with activity or exertion.  Also he is noted that he has had lower extremity swelling over the last month.  He has checked his temperature it was 100.1.  He went to his primary care physician Dr. Jonny Ruiz who asked him to come to the emergency department for further evaluation.  Denies any headaches or vision changes.  Denies any chest pain.  Denies any abdominal pain or diarrhea.  Due to his fever in the emergency department, hospitalist service was asked to admit the patient for further care and management.  Of note he reports that he went to the beach one week ago.  Denies any international travel.  Review of Systems: All systems reviewed with the patient and positive as per history of present illness, otherwise all other systems are negative.  Past Medical History  Diagnosis Date  . Diabetes mellitus     type II  . Hyperlipidemia   . Hypertension   . DVT, lower extremity     right  . Embolus 2005    pulmonary  . Depression   . Obesity   . BACK PAIN   . COLONIC POLYPS, HX OF   . DEGENERATIVE JOINT DISEASE, KNEES, BILATERAL   . DEPRESSION   . DIVERTICULOSIS, COLON   . HYPOGONADISM, MALE   . HYPOTENSION, ORTHOSTATIC   . OBESITY, MORBID   . Long term current use of anticoagulant   . Obstructive sleep apnea     uses cpap setting 8 to 10  . Kidney stones   . Renal insufficiency   . RENAL  INSUFFICIENCY   . Shortness of breath    Past Surgical History  Procedure Laterality Date  . Knee surgury  04-28-99 & 04/27/02     cartilage damage  . Ankle surgury  2001 bilateral    with bone spurs and torn tendon  . Edg  2007/04/28    chronic Duodenitis  . Tonsillectomy and adenoidectomy  age 35 or 41  . Total knee arthroplasty  01/10/2012    Procedure: TOTAL KNEE ARTHROPLASTY;  Surgeon: Shelda Pal, MD;  Location: [facility];  Service: Orthopedics;  Laterality: Left;  . Lumbar laminectomy/decompression microdiscectomy  09/28/2012    Procedure: LUMBAR LAMINECTOMY/DECOMPRESSION MICRODISCECTOMY 2 LEVELS;  Surgeon: Drucilla Schmidt, MD;  Location: [facility];  Service: Orthopedics;  Laterality: Left;  Decompressive laminectomy L4-L5. Microdiscectomy L5-S1  . Heel spur surgery Bilateral Apr 27, 2004  . Replacement total knee  04/27/2012   Family History  Problem Relation Age of Onset  . Hypertension Mother   . Prostate cancer Father   . Diabetes Sister     died with DM 04/27/2001, 2 more sisters with diabetes  . Diabetes Brother    History   Social History  . Marital Status: Single    Spouse Name: N/A    Number of Children: 2  . Years of Education: N/A   Occupational History  . back at work at [employer]  since Jan. 2010    Social History Main Topics  . Smoking status: Former Smoker -- 0.25 packs/day for 5 years    Quit date: 12/06/1978  . Smokeless tobacco: Not on file  . Alcohol Use: Yes     Comment: occasional  . Drug Use: No  . Sexual Activity: Not on file   Other Topics Concern  . Not on file   Social History Narrative   Daily caffeine use one per day   Allergies: Other  Home Meds: Prior to Admission medications   Medication Sig Start Date End Date Taking? Authorizing Provider  albuterol (PROAIR HFA) 108 (90 BASE) MCG/ACT inhaler Inhale 2 puffs into the lungs every 4 (four) hours as needed. For shortness of breath   Yes Historical Provider, MD  aspirin (ECOTRIN LOW STRENGTH) 81 MG EC  tablet Take 81 mg by mouth daily before breakfast.    Yes Historical Provider, MD  atorvastatin (LIPITOR) 80 MG tablet Take 80 mg by mouth daily. 08/30/13  Yes Corwin Levins, MD  DULoxetine (CYMBALTA) 60 MG capsule Take 60 mg by mouth daily.   Yes Historical Provider, MD  hydrochlorothiazide (HYDRODIURIL) 25 MG tablet Take 25 mg by mouth every morning. 08/30/13  Yes Corwin Levins, MD  HYDROcodone-acetaminophen (NORCO/VICODIN) 5-325 MG per tablet Take 1-2 tablets by mouth every 6 (six) hours as needed. For pain 10/01/12  Yes Amber Tamala Ser, PA-C  insulin glargine (LANTUS) 100 UNIT/ML injection Inject 70 Units into the skin at bedtime.  02/20/13  Yes Romero Belling, MD  insulin lispro (HUMALOG) 100 unit/mL SOLN Inject 30-50 Units into the skin 3 (three) times daily with meals. 35 units in the morning, 50 units at lunch, and 30 units at supper just before a meal 04/11/13  Yes Romero Belling, MD  lisinopril (PRINIVIL,ZESTRIL) 5 MG tablet Take 5 mg by mouth daily. 08/30/13  Yes Corwin Levins, MD  phentermine (ADIPEX-P) 37.5 MG tablet Take 37.5 mg by mouth daily before breakfast.   Yes Historical Provider, MD  warfarin (COUMADIN) 5 MG tablet Take 10 mg by mouth daily.   Yes Historical Provider, MD  ACCU-CHEK SOFTCLIX LANCETS lancets Use as directed four times daily to check blood sugar.  Diagnosis code 250.00 08/30/13   Corwin Levins, MD  Alcohol Swabs (B-D SINGLE USE SWABS REGULAR) PADS Use as directed 08/30/13   Corwin Levins, MD  Blood Glucose Monitoring Suppl (ACCU-CHEK AVIVA PLUS) W/DEVICE KIT 1 each by Other route daily. Use as directed to check blood sugar. 08/30/13   Corwin Levins, MD  glucose blood (ACCU-CHEK AVIVA PLUS) test strip Use as directed four times daily to check blood sugar.  Diagnosis code 250.00 08/30/13   Corwin Levins, MD  Insulin Pen Needle (BD PEN NEEDLE NANO U/F) 32G X 4 MM MISC USE AS DIRECTED 4 TIMES A DAY 08/27/13   Romero Belling, MD    Physical Exam: Blood pressure 121/52, pulse 85,  temperature 99.3 F (37.4 C), temperature source Oral, resp. rate 21, height 6\' 2"  (1.88 m), weight 185.521 kg (409 lb), SpO2 94.00%. General: Awake, Oriented x3, No acute distress. HEENT: EOMI, Moist mucous membranes Neck: Supple CV: S1 and S2, grade 2/6 systolic murmur Lungs: Clear to ascultation bilaterally Abdomen: Soft, Nontender, Nondistended, +bowel sounds. Ext: Good pulses.  1-2+ lower extremity edema. No clubbing or cyanosis noted. Neuro: Cranial Nerves II-XII grossly intact. Has 5/5 motor strength in upper and lower extremities.  Lab results:  Recent Labs  09/11/13 1922  NA 138  K 4.7  CL 97  CO2 30  GLUCOSE 399*  BUN 20  CREATININE 1.23  CALCIUM 9.5    Recent Labs  09/11/13 1922  AST 23  ALT 26  ALKPHOS 99  BILITOT 0.6  PROT 7.9  ALBUMIN 3.8   No results found for this basename: LIPASE, AMYLASE,  in the last 72 hours  Recent Labs  09/11/13 1922  WBC 18.6*  NEUTROABS 15.4*  HGB 13.8  HCT 43.4  MCV 69.9*  PLT 184   No results found for this basename: CKTOTAL, CKMB, CKMBINDEX, TROPONINI,  in the last 72 hours No components found with this basename: POCBNP,  No results found for this basename: DDIMER,  in the last 72 hours No results found for this basename: HGBA1C,  in the last 72 hours No results found for this basename: CHOL, HDL, LDLCALC, TRIG, CHOLHDL, LDLDIRECT,  in the last 72 hours No results found for this basename: TSH, T4TOTAL, FREET3, T3FREE, THYROIDAB,  in the last 72 hours No results found for this basename: VITAMINB12, FOLATE, FERRITIN, TIBC, IRON, RETICCTPCT,  in the last 72 hours Imaging results:  Dg Chest 2 View  09/11/2013   CLINICAL DATA:  Dizziness, fever, shortness of breath  EXAM: CHEST  2 VIEW  COMPARISON:  Prior chest x-ray 01/04/2012  FINDINGS: Stable borderline cardiomegaly and prominence of the mediastinum. Atherosclerotic calcifications noted in the transverse aorta. Slightly increased diffuse prominence of the interstitial  markings. Inspiratory volumes are low. The lateral view is limited by patient or respiratory motion. No focal airspace consolidation, pleural effusion or pneumothorax. No acute osseous abnormality.  IMPRESSION: Slightly increased mild interstitial prominence throughout both lungs. Findings may represent pulmonary vascular congestion and early interstitial edema, or potentially atypical/viral respiratory infection.   Electronically Signed   By: Malachy Moan M.D.   On: 09/11/2013 20:31   Ct Angio Chest W/cm &/or Wo Cm  09/11/2013   CLINICAL DATA:  Dizziness, fever, dysuria, weakness and shortness of breath. Bilateral leg swelling.  EXAM: CT ANGIOGRAPHY CHEST WITH CONTRAST  TECHNIQUE: Multidetector CT imaging of the chest was performed using the standard protocol during bolus administration of intravenous contrast. Multiplanar CT image reconstructions including MIPs were obtained to evaluate the vascular anatomy.  CONTRAST:  OMNIPAQUE IOHEXOL 350 MG/ML SOLN  COMPARISON:  Chest radiograph performed earlier today at 8:10 p.m., and CTA chest from 07/27/2005  FINDINGS: There is no evidence of central pulmonary embolus. Evaluation is suboptimal due to motion artifact.  Hazy bilateral densities are thought to reflect atelectasis, with a mildly mosaic pattern of parenchymal attenuation. There is no evidence of significant focal consolidation, pleural effusion or pneumothorax. No masses are identified; no abnormal focal contrast enhancement is seen.  Scattered coronary artery calcifications are seen. The mediastinum is otherwise unremarkable in appearance. No mediastinal lymphadenopathy is seen. No pericardial effusion is identified. The great vessels are grossly unremarkable in appearance. No axillary lymphadenopathy is seen. The visualized portions of the thyroid gland are unremarkable in appearance.  The visualized portions of the liver and spleen are unremarkable.  No acute osseous abnormalities are seen.   Review of the MIP images confirms the above findings.  IMPRESSION: 1. No evidence of central pulmonary embolus. 2. Mild bilateral atelectasis noted, with a mildly mosaic pattern of parenchymal attenuation. Lungs otherwise clear. 3. Scattered coronary artery calcifications seen.   Electronically Signed   By: Roanna Raider M.D.   On: 09/11/2013 22:26   Other results:  Assessment & Plan  by Problem: Fever of unclear etiology Though urinalysis is negative, patient is complaining of dysuria will start the patient on ceftriaxone.  Add urine culture.  Further titration depending on patient's clinical course.  Blood cultures have been sent.  Cardiac murmur Very soft on exam.  Will get a 2-D echocardiogram in the morning for further evaluation.  Lower extremity edema Etiology unclear.  2-D echocardiogram has been ordered as indicated above.  Patient may have right heart failure due to noncompliance with CPAP for obstructive sleep apnea.  Morbidity obesity Diet and exercise as outpatient.  Patient in the process of seeing surgery for outpatient surgery.  Diabetes Sliding scale insulin.  Continue home dose of insulin.  Dysuria Management as indicated above.  Leukocytosis Likely due to fever, early SIRS.  Obstructive sleep apnea Continue CPAP.  History of pulmonary embolism/DVT Therapeutic INR.  Continue Coumadin.  Chronic back pain Stable.  Depression Stable.  Prophylaxis Therapeutic INR.  CODE STATUS Full code.  Disposition Admit the patient to telemetry as inpatient.  Time spent on admission, talking to the patient, and coordinating care was: 60 mins.  Tayson Schnelle A, MD 09/12/2013, 12:05 AM

## 2013-09-12 NOTE — Progress Notes (Signed)
ANTICOAGULATION CONSULT NOTE - Initial Consult  Pharmacy Consult for Coumadin Indication: h/o PE/DVT  Allergies  Allergen Reactions  . Other     NO BLOOD -JEHOVAH'S WITNESS-SIGNED REFUSAL BUT WOULD TAKE ALBUMIN    Patient Measurements: Height: 6\' 2"  (188 cm) Weight: 402 lb 6.4 oz (182.527 kg) IBW/kg (Calculated) : 82.2  Vital Signs: Temp: 98.8 F (37.1 C) (10/08 0100) Temp src: Oral (10/08 0100) BP: 155/88 mmHg (10/08 0100) Pulse Rate: 81 (10/08 0234)  Labs:  Recent Labs  09/11/13 1922 09/11/13 2054  HGB 13.8  --   HCT 43.4  --   PLT 184  --   LABPROT  --  24.2*  INR  --  2.26*  CREATININE 1.23  --     Estimated Creatinine Clearance: 111.9 ml/min (by C-G formula based on Cr of 1.23).   Medical History: Past Medical History  Diagnosis Date  . Diabetes mellitus     type II  . Hyperlipidemia   . Hypertension   . DVT, lower extremity     right  . Embolus 2005    pulmonary  . Depression   . Obesity   . BACK PAIN   . COLONIC POLYPS, HX OF   . DEGENERATIVE JOINT DISEASE, KNEES, BILATERAL   . DEPRESSION   . DIVERTICULOSIS, COLON   . HYPOGONADISM, MALE   . HYPOTENSION, ORTHOSTATIC   . OBESITY, MORBID   . Long term current use of anticoagulant   . Obstructive sleep apnea     uses cpap setting 8 to 10  . Kidney stones   . Renal insufficiency   . RENAL INSUFFICIENCY   . Shortness of breath     Medications:  Prescriptions prior to admission  Medication Sig Dispense Refill  . albuterol (PROAIR HFA) 108 (90 BASE) MCG/ACT inhaler Inhale 2 puffs into the lungs every 4 (four) hours as needed. For shortness of breath      . aspirin (ECOTRIN LOW STRENGTH) 81 MG EC tablet Take 81 mg by mouth daily before breakfast.       . atorvastatin (LIPITOR) 80 MG tablet Take 80 mg by mouth daily.      . DULoxetine (CYMBALTA) 60 MG capsule Take 60 mg by mouth daily.      . hydrochlorothiazide (HYDRODIURIL) 25 MG tablet Take 25 mg by mouth every morning.      Marland Kitchen  HYDROcodone-acetaminophen (NORCO/VICODIN) 5-325 MG per tablet Take 1-2 tablets by mouth every 6 (six) hours as needed. For pain      . insulin glargine (LANTUS) 100 UNIT/ML injection Inject 70 Units into the skin at bedtime.       . insulin lispro (HUMALOG) 100 unit/mL SOLN Inject 30-50 Units into the skin 3 (three) times daily with meals. 35 units in the morning, 50 units at lunch, and 30 units at supper just before a meal      . lisinopril (PRINIVIL,ZESTRIL) 5 MG tablet Take 5 mg by mouth daily.      . phentermine (ADIPEX-P) 37.5 MG tablet Take 37.5 mg by mouth daily before breakfast.      . warfarin (COUMADIN) 5 MG tablet Take 10 mg by mouth daily.      Marland Kitchen ACCU-CHEK SOFTCLIX LANCETS lancets Use as directed four times daily to check blood sugar.  Diagnosis code 250.00  300 each  3  . Alcohol Swabs (B-D SINGLE USE SWABS REGULAR) PADS Use as directed  200 each  3  . Blood Glucose Monitoring Suppl (ACCU-CHEK AVIVA PLUS) W/DEVICE  KIT 1 each by Other route daily. Use as directed to check blood sugar.  1 kit  0  . glucose blood (ACCU-CHEK AVIVA PLUS) test strip Use as directed four times daily to check blood sugar.  Diagnosis code 250.00  300 each  3  . Insulin Pen Needle (BD PEN NEEDLE NANO U/F) 32G X 4 MM MISC USE AS DIRECTED 4 TIMES A DAY  100 each  2    Assessment: 57 yo male with fever, h/o PE/DVT, to continue Coumadin  Goal of Therapy:  INR 2-3 Monitor platelets by anticoagulation protocol: Yes   Plan:  Continue home regimen Daily INR  [name], [name] 09/12/2013,3:51 AM

## 2013-09-12 NOTE — Progress Notes (Signed)
Placed patient on his home settings of 8 cm H2O, nasal mask, and humidity.  O2 bleedin set at 2 L/M and patients SPO2 remains at 96% to 96%.  Patient tolerating CPAP well.

## 2013-09-12 NOTE — Progress Notes (Signed)
[facility]                                                                                Patient Demographics  [name], is a 57 y.o. male, DOB - 1955-12-27, ZOX:[id number]  Admit date - 09/11/2013   Admitting Physician Cristal Ford, MD  Outpatient Primary MD for the patient is Oliver Barre, MD  LOS - 1   Chief Complaint  Patient presents with  . Dizziness  . Urinary Tract Infection  . Fever  . Shortness of Breath        Assessment & Plan  Sepsis secondary to possible prostatitis  -Will continue IV ceftriaxone.    -Urinalysis negative. Will obtain another urinalysis with urine culture.  -Spoke with Dr. Sherron Monday, he recommended sending the patient home with ciprofloxacin 250 mg twice a day for 30 days and meloxicam 15 mg per day for one month. Outpatient followup.  -Patient continues to have leukocytosis however has remained afebrile for the last 24 hours.  We'll continue to monitor.  -Ordered gonorrhea Chlamydia.  Cardiac murmur   -Pending echocardiogram.   Lower extremity edema   -Pending echo.  Maybe secondary to noncompliance with CPAP for OSA.  Morbidity obesity   -Diet and exercise as outpatient. Patient in the process of seeing surgery for outpatient surgery.   Diabetes   -Sliding scale insulin. Continue home dose of insulin.   Dysuria   -Management as indicated above.   Obstructive sleep apnea   -Continue CPAP.   History of pulmonary embolism/DVT   -Therapeutic INR. Continue Coumadin, pharmacy following.  Chronic back pain   -Stable.   Depression   -Stable.  Principal Problem:   Sepsis Active Problems:   DIABETES MELLITUS, TYPE II   OBESITY, MORBID   DEPRESSION   BACK PAIN   Pulmonary embolism   Dysuria   Fever   Shortness of breath   Leukocytosis   OSA (obstructive sleep apnea)   Code Status: Full  Family Communication: None  Disposition Plan: Likely discharge within the next few days.  Currently still has increasing  leukocytosis.     Procedures none  Consults  Dr. Sherron Monday (via phone)  DVT Prophylaxis  Warfarin  Lab Results  Component Value Date   PLT 169 09/12/2013    Medications  Scheduled Meds: . aspirin EC  81 mg Oral QAC breakfast  . atorvastatin  80 mg Oral Daily  . cefTRIAXone (ROCEPHIN)  IV  1 g Intravenous Q24H  . Chlorhexidine Gluconate Cloth  6 each Topical Q0600  . DULoxetine  60 mg Oral Daily  . hydrochlorothiazide  25 mg Oral q morning - 10a  . [START ON 09/13/2013] influenza vac split quadrivalent PF  0.5 mL Intramuscular Tomorrow-1000  . insulin aspart  0-15 Units Subcutaneous TID WC  . insulin aspart  0-5 Units Subcutaneous QHS  . insulin aspart  30 Units Subcutaneous QAC supper  . insulin aspart  35 Units Subcutaneous Q breakfast  . insulin aspart  50 Units Subcutaneous Q1200  . insulin glargine  70 Units Subcutaneous QHS  . lisinopril  5 mg Oral Daily  . mupirocin ointment  1 application Nasal BID  . sodium  chloride  3 mL Intravenous Q12H  . warfarin  10 mg Oral q1800  . Warfarin - Pharmacist Dosing Inpatient   Does not apply q1800   Continuous Infusions:  PRN Meds:.acetaminophen, acetaminophen, albuterol, albuterol, HYDROcodone-acetaminophen, ondansetron (ZOFRAN) IV, ondansetron  Antibiotics    Anti-infectives   Start     Dose/Rate Route Frequency Ordered Stop   09/12/13 0000  cefTRIAXone (ROCEPHIN) 1 g in dextrose 5 % 50 mL IVPB     1 g 100 mL/hr over 30 Minutes Intravenous Every 24 hours 09/11/13 2357         Time Spent in minutes   30 minutes   Eliska Hamil D.O. on 09/12/2013 at 1:25 PM  Between 7am to 7pm - Pager - [phone]  After 7pm go to [email or web address] - password [email or web address]  And look for the night coverage person covering for me after hours  [facility] Office  [phone]    Subjective:   [name] seen and examined today.  Still complains of burning pain with urination.  He denies new sexual partners.  He complains  of some lower abdominal pain (suprapubic) pain.  Patient denies dizziness, chest pain, shortness of breath, new weakness, numbess, tingling, denies diarrhea, nausea, vomiting.  Objective:   Filed Vitals:   09/11/13 2330 09/12/13 0100 09/12/13 0234 09/12/13 0607  BP: 121/52 155/88  159/78  Pulse: 85 93 81 80  Temp:  98.8 F (37.1 C)  98.5 F (36.9 C)  TempSrc:  Oral  Oral  Resp: 21 20 16 22   Height:  6\' 2"  (1.88 m)    Weight:  182.527 kg (402 lb 6.4 oz)    SpO2: 94% 95% 96% 100%    Wt Readings from Last 3 Encounters:  09/12/13 182.527 kg (402 lb 6.4 oz)  09/11/13 185.521 kg (409 lb)  08/17/13 184.16 kg (406 lb)     Intake/Output Summary (Last 24 hours) at 09/12/13 1325 Last data filed at 09/12/13 0700  Gross per 24 hour  Intake    720 ml  Output    250 ml  Net    470 ml    Exam  General: Well developed, well nourished, NAD, appears stated age  HEENT: NCAT, PERRLA, EOMI, Anicteic Sclera, mucous membranes moist.  Neck: Supple  Cardiovascular: S1 S2 auscultated, 2/6 SEM,  Regular rate and rhythm.  Respiratory: Clear to auscultation bilaterally with equal chest rise  Abdomen: Soft, obese, tender to palpation of the suprapubic region, + bowel sounds  Extremities: +2 pitting edema, warm dry without cyanosis clubbing  Neuro: AAOx3, cranial nerves grossly intact. Strength 5/5 in patient's upper and lower extremities bilaterally  Skin: Without rashes exudates or nodules  Psych: Normal affect and demeanor with intact judgement and insight    Data Review   Micro Results Recent Results (from the past 240 hour(s))  MRSA PCR SCREENING     Status: Abnormal   Collection Time    09/12/13  3:18 AM      Result Value Range Status   MRSA by PCR POSITIVE (*) NEGATIVE Final   Comment:            The GeneXpert MRSA Assay (FDA     approved for NASAL specimens     only), is one component of a     comprehensive MRSA colonization     surveillance program. It is not      intended to diagnose MRSA     infection nor to guide or     monitor  treatment for     MRSA infections.     RESULT CALLED TO, READ BACK BY AND VERIFIED WITH:     CALLED TO RN Angelina Sheriff. [id number] @[phone]  [facility]    Radiology Reports Dg Chest 2 View  09/11/2013   CLINICAL DATA:  Dizziness, fever, shortness of breath  EXAM: CHEST  2 VIEW  COMPARISON:  Prior chest x-ray 01/04/2012  FINDINGS: Stable borderline cardiomegaly and prominence of the mediastinum. Atherosclerotic calcifications noted in the transverse aorta. Slightly increased diffuse prominence of the interstitial markings. Inspiratory volumes are low. The lateral view is limited by patient or respiratory motion. No focal airspace consolidation, pleural effusion or pneumothorax. No acute osseous abnormality.  IMPRESSION: Slightly increased mild interstitial prominence throughout both lungs. Findings may represent pulmonary vascular congestion and early interstitial edema, or potentially atypical/viral respiratory infection.   Electronically Signed   By: Malachy Moan M.D.   On: 09/11/2013 20:31   Ct Angio Chest W/cm &/or Wo Cm  09/11/2013   CLINICAL DATA:  Dizziness, fever, dysuria, weakness and shortness of breath. Bilateral leg swelling.  EXAM: CT ANGIOGRAPHY CHEST WITH CONTRAST  TECHNIQUE: Multidetector CT imaging of the chest was performed using the standard protocol during bolus administration of intravenous contrast. Multiplanar CT image reconstructions including MIPs were obtained to evaluate the vascular anatomy.  CONTRAST:  OMNIPAQUE IOHEXOL 350 MG/ML SOLN  COMPARISON:  Chest radiograph performed earlier today at 8:10 p.m., and CTA chest from 07/27/2005  FINDINGS: There is no evidence of central pulmonary embolus. Evaluation is suboptimal due to motion artifact.  Hazy bilateral densities are thought to reflect atelectasis, with a mildly mosaic pattern of parenchymal attenuation. There is no evidence of significant focal  consolidation, pleural effusion or pneumothorax. No masses are identified; no abnormal focal contrast enhancement is seen.  Scattered coronary artery calcifications are seen. The mediastinum is otherwise unremarkable in appearance. No mediastinal lymphadenopathy is seen. No pericardial effusion is identified. The great vessels are grossly unremarkable in appearance. No axillary lymphadenopathy is seen. The visualized portions of the thyroid gland are unremarkable in appearance.  The visualized portions of the liver and spleen are unremarkable.  No acute osseous abnormalities are seen.  Review of the MIP images confirms the above findings.  IMPRESSION: 1. No evidence of central pulmonary embolus. 2. Mild bilateral atelectasis noted, with a mildly mosaic pattern of parenchymal attenuation. Lungs otherwise clear. 3. Scattered coronary artery calcifications seen.   Electronically Signed   By: Roanna Raider M.D.   On: 09/11/2013 22:26    CBC  Recent Labs Lab 09/11/13 1922 09/12/13 0850  WBC 18.6* 19.4*  HGB 13.8 13.0  HCT 43.4 40.4  PLT 184 169  MCV 69.9* 69.1*  MCH 22.2* 22.2*  MCHC 31.8 32.2  RDW 15.9* 15.8*  LYMPHSABS 1.9  --   MONOABS 1.3*  --   EOSABS 0.0  --   BASOSABS 0.0  --     Chemistries   Recent Labs Lab 09/11/13 1922 09/12/13 0850  NA 138 135  K 4.7 3.8  CL 97 97  CO2 30 29  GLUCOSE 399* 225*  BUN 20 17  CREATININE 1.23 1.05  CALCIUM 9.5 9.0  AST 23  --   ALT 26  --   ALKPHOS 99  --   BILITOT 0.6  --    ------------------------------------------------------------------------------------------------------------------ estimated creatinine clearance is 131 ml/min (by C-G formula based on Cr of 1.05). ------------------------------------------------------------------------------------------------------------------ No results found for this basename: HGBA1C,  in the last 72  hours ------------------------------------------------------------------------------------------------------------------  No results found for this basename: CHOL, HDL, LDLCALC, TRIG, CHOLHDL, LDLDIRECT,  in the last 72 hours ------------------------------------------------------------------------------------------------------------------ No results found for this basename: TSH, T4TOTAL, FREET3, T3FREE, THYROIDAB,  in the last 72 hours ------------------------------------------------------------------------------------------------------------------ No results found for this basename: VITAMINB12, FOLATE, FERRITIN, TIBC, IRON, RETICCTPCT,  in the last 72 hours  Coagulation profile  Recent Labs Lab 09/11/13 2054 09/12/13 0850  INR 2.26* 2.00*    No results found for this basename: DDIMER,  in the last 72 hours  Cardiac Enzymes No results found for this basename: CK, CKMB, TROPONINI, MYOGLOBIN,  in the last 168 hours ------------------------------------------------------------------------------------------------------------------ No components found with this basename: POCBNP,

## 2013-09-12 NOTE — Progress Notes (Signed)
Spoke with pt about diabetes and outpatient regimen for diabetes control. Discussed A1C results (9.3%) and importance of checking CBGs and maintaining good CBG control to prevent long-term and short-term complications. According to the patient he takes Lantus 70 units QAM, Humalog 35 units with breakfast, Humalog 50 units with lunch, and Humalog 30 units with supper for diabetes management.  He last saw Dr. Everardo All on 08/17/2013 and the Humalog meal dosages were increased at that time (prior to visit was on 35 w/breakfast, 35 w/lunch, and 30 w/supper).  Patient reports that his blood sugars are improving and states his fasting blood glucose is typically in 120's mg/dl, blood glucose before lunch runs in 130's mg/dl, and before supper his blood glucose runs in the 130's mg/dl.  According to the patient he does not have low blood sugars very often, in a typical month he may experience 2 or 3 lows.  Noted in Dr. George Hugh office note from 08/17/2013 that the patient is working toward bariatric surgery.  In talking with the patient he reports that he has recently joined a gym and he is trying to eat healthier in hopes that he blood sugars will continue to improve.  Patient verbalized understanding of information discussed and has no further questions or concerns at this time.    Thanks, Orlando Penner, RN, MSN, CCRN Diabetes Coordinator Inpatient Diabetes Program [phone] (Team Pager) [phone] ([facility] office) [phone] [facility] office)

## 2013-09-13 DIAGNOSIS — I517 Cardiomegaly: Secondary | ICD-10-CM

## 2013-09-13 DIAGNOSIS — N419 Inflammatory disease of prostate, unspecified: Secondary | ICD-10-CM

## 2013-09-13 LAB — GC/CHLAMYDIA PROBE AMP
CT Probe RNA: NEGATIVE
GC Probe RNA: NEGATIVE

## 2013-09-13 LAB — URINE CULTURE
Colony Count: NO GROWTH
Culture: NO GROWTH

## 2013-09-13 LAB — BASIC METABOLIC PANEL
BUN: 21 mg/dL (ref 6–23)
Chloride: 97 mEq/L (ref 96–112)
Creatinine, Ser: 1.17 mg/dL (ref 0.50–1.35)
GFR calc non Af Amer: 67 mL/min — ABNORMAL LOW (ref >90)
Glucose, Bld: 117 mg/dL — ABNORMAL HIGH (ref 70–99)
Potassium: 3.8 mEq/L (ref 3.5–5.1)

## 2013-09-13 LAB — PROTIME-INR: INR: 1.69 — ABNORMAL HIGH (ref 0.00–1.49)

## 2013-09-13 LAB — CBC
HCT: 39.1 % (ref 39.0–52.0)
Hemoglobin: 12.9 g/dL — ABNORMAL LOW (ref 13.0–17.0)
MCHC: 33 g/dL (ref 30.0–36.0)
Platelets: 169 10*3/uL (ref 150–400)
RBC: 5.68 MIL/uL (ref 4.22–5.81)

## 2013-09-13 LAB — GLUCOSE, CAPILLARY: Glucose-Capillary: 103 mg/dL — ABNORMAL HIGH (ref 70–99)

## 2013-09-13 MED ORDER — CIPROFLOXACIN HCL 250 MG PO TABS: 250.0000 mg | ORAL_TABLET | Freq: Two times a day (BID) | ORAL | Status: DC

## 2013-09-13 MED ORDER — WARFARIN SODIUM 10 MG PO TABS
12.5000 mg | ORAL_TABLET | Freq: Once | ORAL | Status: DC
  Filled 2013-09-13: qty 1

## 2013-09-13 MED ORDER — MELOXICAM 7.5 MG PO TABS: 15.0000 mg | ORAL_TABLET | Freq: Every day | ORAL | Status: DC

## 2013-09-13 NOTE — Progress Notes (Signed)
  Echocardiogram 2D Echocardiogram has been performed.  [name] 09/13/2013, 11:21 AM

## 2013-09-13 NOTE — Progress Notes (Signed)
ANTICOAGULATION CONSULT NOTE - Follow Up Consult  Pharmacy Consult for Coumadin Indication: Hx PE/DVT  Allergies  Allergen Reactions  . Other     NO BLOOD -JEHOVAH'S WITNESS-SIGNED REFUSAL BUT WOULD TAKE ALBUMIN    Patient Measurements: Height: 6\' 2"  (188 cm) Weight: 411 lb 3.2 oz (186.519 kg) IBW/kg (Calculated) : 82.2 Heparin Dosing Weight:   Vital Signs: Temp: 98.2 F (36.8 C) (10/09 0628) Temp src: Oral (10/09 0628) BP: 134/74 mmHg (10/09 0628) Pulse Rate: 76 (10/09 0628)  Labs:  Recent Labs  09/11/13 1922 09/11/13 2054 09/12/13 0850 09/13/13 0555  HGB 13.8  --  13.0  --   HCT 43.4  --  40.4  --   PLT 184  --  169  --   LABPROT  --  24.2* 22.1* 19.4*  INR  --  2.26* 2.00* 1.69*  CREATININE 1.23  --  1.05 1.17    Estimated Creatinine Clearance: 119.1 ml/min (by C-G formula based on Cr of 1.17).  Assessment: 57yom continuing on chronic Coumadin for hx PE/DVT. INR (1.69) trended down to subtherapeutic levels despite continuing patient's PTA regimen (10mg  daily) - will increase dose today and follow-up AM INR. Recommend to continue previous Coumadin regimen on discharge. If patient placed on Cipro, recommend follow-up of INR within 1 week as Cipro can increase the INR when used concomitantly with Coumadin.  - No CBC this AM - No significant bleeding reported  Goal of Therapy:  INR 2-3   Plan:  1. Coumadin 12.5mg  po x 1 today 2. Follow-up AM INR and discharge plans  Cleon Dew [phone] 09/13/2013,9:03 AM

## 2013-09-13 NOTE — Progress Notes (Signed)
Patient discharged to home. Patient AVS reviewed with patient and patient's sister.  Patient verbalized understanding of medications and follow-up appointments.  Patient remains stable; no signs or symptoms of distress.  Patient educated to return to the ER in cases of SOB, dizziness, fever, chest pain, or fainting.

## 2013-09-13 NOTE — Discharge Summary (Signed)
Physician Discharge Summary  [name] BJY:[id number] DOB: 1956-11-17 DOA: 09/11/2013  PCP: Oliver Barre, MD  Admit date: 09/11/2013 Discharge date: 09/13/2013  Time spent: 45 minutes  Recommendations for Outpatient Follow-up:  Follow up with PCP within one to two weeks of discharge.  Also follow up with Dr. Sherron Monday (urologist) in one month.  Take medication as prescribed.  Have INR checked on Monday, Sep 17, 2013.  Discharge Diagnoses:  Principal Problem:   Sepsis secondary to possible prostatitis Active Problems:   DIABETES MELLITUS, TYPE II   OBESITY, MORBID   DEPRESSION   BACK PAIN   Pulmonary embolism   Dysuria   Fever   Shortness of breath   Leukocytosis   OSA (obstructive sleep apnea)  Discharge Condition: Stable  Diet recommendation: Heart healthy  Filed Weights   09/11/13 1917 09/12/13 0100 09/12/13 2055  Weight: 185.521 kg (409 lb) 182.527 kg (402 lb 6.4 oz) 186.519 kg (411 lb 3.2 oz)    History of present illness:  [name] is a 57 y.o. African American male with history of type 2 diabetes, hypertension, hyperlipidemia, DVT and pulmonary embolism on chronic anticoagulation, depression, morbid obesity, chronic back pain, obstructive sleep apnea with noncompliance with CPAP, and chronic kidney disease stage II who presents with the above complaints. Patient reported that his symptoms started this morning at 1 a.m. with dysuria. Later on during the day he noted that he had dizziness and shortness of breath with activity or exertion. Also he is noted that he has had lower extremity swelling over the last month. He has checked his temperature it was 100.1. He went to his primary care physician Dr. Jonny Ruiz who asked him to come to the emergency department for further evaluation. Denies any headaches or vision changes. Denies any chest pain. Denies any abdominal pain or diarrhea. Due to his fever in the emergency department, hospitalist service was asked to admit the  patient for further care and management. Of note he reports that he went to the beach one week ago. Denies any international travel.   Hospital Course:  This is a 57 year old African American male with a history of type 2 diabetes hypertension, DVT and pulmonary embolism on coagulation that presents emergency department with complaints of dysuria.  He denied any new sexual partners. He simply stated that he has pain and burning with urination. Patient also said he had a fever over 100.1 while at home.  While in the hospital he was noted to have a temperature 101.59F.  Patient is also known to have leukocytosis of 19. He was started on IV ceftriaxone. Unfortunately his pain with urination did not subside. I spoke with Dr. Sherron Monday who suggested this could be prostatitis in nature. Patient will be discharged on ciprofloxacin 250 mg twice a day for 30 days as well as meloxicam 15 mg daily for 30 days. He is to follow up with Dr. Jacquelyne Balint in one month. She'll also need follow up with his INR checked on Monday, 09/17/2013.  You'll also need help with his primary care physician as well.  Patient also complained of headache during his hospital course he can use Tylenol as well as the meloxicam. His other chronic problems as such as diabetes, obstructive sleep apnea, pulmonary embolism DVT, chronic back pain were stable during his hospital course.  Procedures:  None  Consultations:  Dr. Sherron Monday, via phone  Discharge Exam: Filed Vitals:   09/13/13 0628  BP: 134/74  Pulse: 76  Temp: 98.2 F (36.8  C)  Resp: 18    General: Well developed, well nourished, NAD, appears stated age  HEENT: NCAT, PERRLA, EOMI, Anicteic Sclera, mucous membranes moist.  Neck: Supple  Cardiovascular: S1 S2 auscultated, 2/6 SEM, Regular rate and rhythm.  Respiratory: Clear to auscultation bilaterally with equal chest rise  Abdomen: Soft, obese, tender to palpation of the suprapubic region, + bowel sounds Extremities:  +pitting edema, warm dry without cyanosis clubbing  Neuro: AAOx3, cranial nerves grossly intact.  Skin: Without rashes exudates or nodules  Psych: Normal affect and demeanor with intact judgement and insight  Discharge Instructions  Discharge Orders   Future Appointments Provider Department Dept Phone   09/21/2013 9:15 AM Romero Belling, MD [facility] PRIMARY CARE ENDOCRINOLOGY [phone]   09/25/2013 11:00 AM [facility] -[place] [phone]   10/10/2013 10:45 AM Corwin Levins, MD [facility] Primary [facility] -[place] [phone]   Future Orders Complete By Expires   Diet - low sodium heart healthy  As directed    Discharge instructions  As directed    Comments:     Follow up with PCP within one to two weeks of discharge.  Also follow up with Dr. Sherron Monday (urologist) in one month.  Take medication as prescribed.  Have INR checked on Monday, Sep 17, 2013.   Increase activity slowly  As directed        Medication List         ACCU-CHEK AVIVA PLUS W/DEVICE Kit  1 each by Other route daily. Use as directed to check blood sugar.     ACCU-CHEK SOFTCLIX LANCETS lancets  Use as directed four times daily to check blood sugar.  Diagnosis code 250.00     atorvastatin 80 MG tablet  Commonly known as:  LIPITOR  Take 80 mg by mouth daily.     B-D SINGLE USE SWABS REGULAR Pads  Use as directed     ciprofloxacin 250 MG tablet  Commonly known as:  CIPRO  Take 1 tablet (250 mg total) by mouth 2 (two) times daily.     DULoxetine 60 MG capsule  Commonly known as:  CYMBALTA  Take 60 mg by mouth daily.     ECOTRIN LOW STRENGTH 81 MG EC tablet  Generic drug:  aspirin  Take 81 mg by mouth daily before breakfast.     glucose blood test strip  Commonly known as:  ACCU-CHEK AVIVA PLUS  Use as directed four times daily to check blood sugar.  Diagnosis code 250.00     hydrochlorothiazide 25 MG tablet  Commonly known as:  HYDRODIURIL  Take 25 mg by  mouth every morning.     HYDROcodone-acetaminophen 5-325 MG per tablet  Commonly known as:  NORCO/VICODIN  Take 1-2 tablets by mouth every 6 (six) hours as needed. For pain     insulin glargine 100 UNIT/ML injection  Commonly known as:  LANTUS  Inject 70 Units into the skin at bedtime.     insulin lispro 100 UNIT/ML Sopn  Commonly known as:  HUMALOG  Inject 30-50 Units into the skin 3 (three) times daily with meals. 35 units in the morning, 50 units at lunch, and 30 units at supper just before a meal     Insulin Pen Needle 32G X 4 MM Misc  Commonly known as:  BD PEN NEEDLE NANO U/F  USE AS DIRECTED 4 TIMES A DAY     lisinopril 5 MG tablet  Commonly known as:  PRINIVIL,ZESTRIL  Take 5 mg  by mouth daily.     meloxicam 7.5 MG tablet  Commonly known as:  MOBIC  Take 2 tablets (15 mg total) by mouth daily.     phentermine 37.5 MG tablet  Commonly known as:  ADIPEX-P  Take 37.5 mg by mouth daily before breakfast.     PROAIR HFA 108 (90 BASE) MCG/ACT inhaler  Generic drug:  albuterol  Inhale 2 puffs into the lungs every 4 (four) hours as needed. For shortness of breath     warfarin 5 MG tablet  Commonly known as:  COUMADIN  Take 10 mg by mouth daily.       Allergies  Allergen Reactions  . Other     NO BLOOD -JEHOVAH'S WITNESS-SIGNED REFUSAL BUT WOULD TAKE ALBUMIN       Follow-up Information   Follow up with Oliver Barre, MD. Schedule an appointment as soon as possible for a visit in 2 months.   Specialties:  Internal Medicine, Radiology   Contact information:   [address] [place] [address] [phone]       Follow up with MACDIARMID,SCOTT A, MD. Schedule an appointment as soon as possible for a visit in 1 month.   Specialty:  Urology   Contact information:   [address] [place] [address] [facility] [place] [address] [phone]        The results of significant diagnostics from this hospitalization (including imaging,  microbiology, ancillary and laboratory) are listed below for reference.    Significant Diagnostic Studies: Dg Chest 2 View  09/11/2013   CLINICAL DATA:  Dizziness, fever, shortness of breath  EXAM: CHEST  2 VIEW  COMPARISON:  Prior chest x-ray 01/04/2012  FINDINGS: Stable borderline cardiomegaly and prominence of the mediastinum. Atherosclerotic calcifications noted in the transverse aorta. Slightly increased diffuse prominence of the interstitial markings. Inspiratory volumes are low. The lateral view is limited by patient or respiratory motion. No focal airspace consolidation, pleural effusion or pneumothorax. No acute osseous abnormality.  IMPRESSION: Slightly increased mild interstitial prominence throughout both lungs. Findings may represent pulmonary vascular congestion and early interstitial edema, or potentially atypical/viral respiratory infection.   Electronically Signed   By: Malachy Moan M.D.   On: 09/11/2013 20:31   Ct Angio Chest W/cm &/or Wo Cm  09/11/2013   CLINICAL DATA:  Dizziness, fever, dysuria, weakness and shortness of breath. Bilateral leg swelling.  EXAM: CT ANGIOGRAPHY CHEST WITH CONTRAST  TECHNIQUE: Multidetector CT imaging of the chest was performed using the standard protocol during bolus administration of intravenous contrast. Multiplanar CT image reconstructions including MIPs were obtained to evaluate the vascular anatomy.  CONTRAST:  OMNIPAQUE IOHEXOL 350 MG/ML SOLN  COMPARISON:  Chest radiograph performed earlier today at 8:10 p.m., and CTA chest from 07/27/2005  FINDINGS: There is no evidence of central pulmonary embolus. Evaluation is suboptimal due to motion artifact.  Hazy bilateral densities are thought to reflect atelectasis, with a mildly mosaic pattern of parenchymal attenuation. There is no evidence of significant focal consolidation, pleural effusion or pneumothorax. No masses are identified; no abnormal focal contrast enhancement is seen.  Scattered  coronary artery calcifications are seen. The mediastinum is otherwise unremarkable in appearance. No mediastinal lymphadenopathy is seen. No pericardial effusion is identified. The great vessels are grossly unremarkable in appearance. No axillary lymphadenopathy is seen. The visualized portions of the thyroid gland are unremarkable in appearance.  The visualized portions of the liver and spleen are unremarkable.  No acute osseous abnormalities are  seen.  Review of the MIP images confirms the above findings.  IMPRESSION: 1. No evidence of central pulmonary embolus. 2. Mild bilateral atelectasis noted, with a mildly mosaic pattern of parenchymal attenuation. Lungs otherwise clear. 3. Scattered coronary artery calcifications seen.   Electronically Signed   By: Roanna Raider M.D.   On: 09/11/2013 22:26    Microbiology: Recent Results (from the past 240 hour(s))  MRSA PCR SCREENING     Status: Abnormal   Collection Time    09/12/13  3:18 AM      Result Value Range Status   MRSA by PCR POSITIVE (*) NEGATIVE Final   Comment:            The GeneXpert MRSA Assay (FDA     approved for NASAL specimens     only), is one component of a     comprehensive MRSA colonization     surveillance program. It is not     intended to diagnose MRSA     infection nor to guide or     monitor treatment for     MRSA infections.     RESULT CALLED TO, READ BACK BY AND VERIFIED WITH:     CALLED TO RN Angelina Sheriff. [phone] @0544  THANEY     Labs: Basic Metabolic Panel:  Recent Labs Lab 09/11/13 1922 09/12/13 0850 09/13/13 0555  NA 138 135 136  K 4.7 3.8 3.8  CL 97 97 97  CO2 30 29 31   GLUCOSE 399* 225* 117*  BUN 20 17 21   CREATININE 1.23 1.05 1.17  CALCIUM 9.5 9.0 8.9   Liver Function Tests:  Recent Labs Lab 09/11/13 1922  AST 23  ALT 26  ALKPHOS 99  BILITOT 0.6  PROT 7.9  ALBUMIN 3.8   No results found for this basename: LIPASE, AMYLASE,  in the last 168 hours No results found for this basename:  AMMONIA,  in the last 168 hours CBC:  Recent Labs Lab 09/11/13 1922 09/12/13 0850  WBC 18.6* 19.4*  NEUTROABS 15.4*  --   HGB 13.8 13.0  HCT 43.4 40.4  MCV 69.9* 69.1*  PLT 184 169   Cardiac Enzymes: No results found for this basename: CKTOTAL, CKMB, CKMBINDEX, TROPONINI,  in the last 168 hours BNP: BNP (last 3 results)  Recent Labs  09/11/13 1921  PROBNP 41.1   CBG:  Recent Labs Lab 09/12/13 0743 09/12/13 1142 09/12/13 1708 09/12/13 2126 09/13/13 0815  GLUCAP 224* 180* 95 118* 103*       Signed:  Dailah Opperman  [facility] 09/13/2013, 10:25 AM

## 2013-09-14 LAB — URINE CULTURE: Colony Count: >=100000

## 2013-09-18 LAB — CULTURE, BLOOD (ROUTINE X 2)
Culture: NO GROWTH
Culture: NO GROWTH

## 2013-09-21 ENCOUNTER — Encounter: Payer: Self-pay | Admitting: Endocrinology

## 2013-09-21 ENCOUNTER — Ambulatory Visit (INDEPENDENT_AMBULATORY_CARE_PROVIDER_SITE_OTHER): Payer: Commercial Managed Care - HMO | Admitting: Endocrinology

## 2013-09-21 VITALS — BP 128/80 | HR 76 | Ht 74.0 in | Wt >= 6400 oz

## 2013-09-21 DIAGNOSIS — Z0000 Encounter for general adult medical examination without abnormal findings: Secondary | ICD-10-CM | POA: Insufficient documentation

## 2013-09-21 NOTE — Progress Notes (Signed)
Subjective:    Patient ID: [name], male    DOB: 21-Mar-1956, 57 y.o.   MRN: [id number]  HPI Pt returns for f/u of insulin-requiring DM (dx'ed 1996, when he presented with polyuria; he has mild if any neuropathy of the lower extremities; he has associated renal insufficiency and autonomic neuropathy; he has never had severe hypoglycemia or DKA; he is working towards bariatric surgery).   no cbg record, but states cbg's are mildly low approx twice a month.  This happens in am, or in the afternoon.  It is highest at hs, and seldom over 200.   Past Medical History  Diagnosis Date  . Diabetes mellitus     type II  . Hyperlipidemia   . Hypertension   . DVT, lower extremity     right  . Embolus 2005    pulmonary  . Depression   . Obesity   . BACK PAIN   . COLONIC POLYPS, HX OF   . DEGENERATIVE JOINT DISEASE, KNEES, BILATERAL   . DEPRESSION   . DIVERTICULOSIS, COLON   . HYPOGONADISM, MALE   . HYPOTENSION, ORTHOSTATIC   . OBESITY, MORBID   . Long term current use of anticoagulant   . Obstructive sleep apnea     uses cpap setting 8 to 10  . Kidney stones   . Renal insufficiency   . RENAL INSUFFICIENCY   . Shortness of breath   . No blood products     Past Surgical History  Procedure Laterality Date  . Knee surgury  2000 & 2003     cartilage damage  . Ankle surgury  2001 bilateral    with bone spurs and torn tendon  . Edg  2008    chronic Duodenitis  . Tonsillectomy and adenoidectomy  age 33 or 12  . Total knee arthroplasty  01/10/2012    Procedure: TOTAL KNEE ARTHROPLASTY;  Surgeon: Shelda Pal, MD;  Location: [facility];  Service: Orthopedics;  Laterality: Left;  . Lumbar laminectomy/decompression microdiscectomy  09/28/2012    Procedure: LUMBAR LAMINECTOMY/DECOMPRESSION MICRODISCECTOMY 2 LEVELS;  Surgeon: Drucilla Schmidt, MD;  Location: [facility];  Service: Orthopedics;  Laterality: Left;  Decompressive laminectomy L4-L5. Microdiscectomy L5-S1  . Heel spur surgery  Bilateral 2005  . Replacement total knee  2013    History   Social History  . Marital Status: Single    Spouse Name: N/A    Number of Children: 2  . Years of Education: N/A   Occupational History  . back at work at [employer] since Jan. 2010    Social History Main Topics  . Smoking status: Former Smoker -- 0.25 packs/day for 5 years    Quit date: 12/06/1978  . Smokeless tobacco: Never Used  . Alcohol Use: Yes     Comment: occasional  . Drug Use: No  . Sexual Activity: Not Currently   Other Topics Concern  . Not on file   Social History Narrative   Daily caffeine use one per day    Current Outpatient Prescriptions on File Prior to Visit  Medication Sig Dispense Refill  . ACCU-CHEK SOFTCLIX LANCETS lancets Use as directed four times daily to check blood sugar.  Diagnosis code 250.00  300 each  3  . albuterol (PROAIR HFA) 108 (90 BASE) MCG/ACT inhaler Inhale 2 puffs into the lungs every 4 (four) hours as needed. For shortness of breath      . Alcohol Swabs (B-D SINGLE USE SWABS REGULAR) PADS Use as  directed  200 each  3  . aspirin (ECOTRIN LOW STRENGTH) 81 MG EC tablet Take 81 mg by mouth daily before breakfast.       . atorvastatin (LIPITOR) 80 MG tablet Take 80 mg by mouth daily.      . Blood Glucose Monitoring Suppl (ACCU-CHEK AVIVA PLUS) W/DEVICE KIT 1 each by Other route daily. Use as directed to check blood sugar.  1 kit  0  . DULoxetine (CYMBALTA) 60 MG capsule Take 60 mg by mouth daily.      Marland Kitchen glucose blood (ACCU-CHEK AVIVA PLUS) test strip Use as directed four times daily to check blood sugar.  Diagnosis code 250.00  300 each  3  . hydrochlorothiazide (HYDRODIURIL) 25 MG tablet Take 25 mg by mouth every morning.      Marland Kitchen HYDROcodone-acetaminophen (NORCO/VICODIN) 5-325 MG per tablet Take 1-2 tablets by mouth every 6 (six) hours as needed. For pain      . insulin glargine (LANTUS) 100 UNIT/ML injection Inject 60 Units into the skin at bedtime.       . insulin  lispro (HUMALOG) 100 unit/mL SOLN 35 units with breakfast, 50 units at lunch, and 40 units at supper.      . Insulin Pen Needle (BD PEN NEEDLE NANO U/F) 32G X 4 MM MISC USE AS DIRECTED 4 TIMES A DAY  100 each  2  . lisinopril (PRINIVIL,ZESTRIL) 5 MG tablet Take 5 mg by mouth daily.      . meloxicam (MOBIC) 7.5 MG tablet Take 2 tablets (15 mg total) by mouth daily.  30 tablet  0  . phentermine (ADIPEX-P) 37.5 MG tablet Take 37.5 mg by mouth daily before breakfast.      . warfarin (COUMADIN) 5 MG tablet Take 10 mg by mouth daily.       No current facility-administered medications on file prior to visit.    Allergies  Allergen Reactions  . Other     NO BLOOD -JEHOVAH'S WITNESS-SIGNED REFUSAL BUT WOULD TAKE ALBUMIN    Family History  Problem Relation Age of Onset  . Hypertension Mother   . Prostate cancer Father   . Diabetes Sister     died with DM 2001/05/02, 2 more sisters with diabetes  . Diabetes Brother     BP 128/80  Pulse 76  Ht 6\' 2"  (1.88 m)  Wt 409 lb (185.521 kg)  BMI 52.49 kg/m2  SpO2 91%  Review of Systems Denies LOC and weight change    Objective:   Physical Exam VITAL SIGNS:  See vs page GENERAL: no distress PSYCH: Alert and oriented x 3.  Does not appear anxious nor depressed.  Lab Results  Component Value Date   HGBA1C 9.3* 08/17/2013      Assessment & Plan:  DM: This insulin regimen was chosen from multiple options, as it best matches his insulin to his changing requirements throughout the day.  The benefits of glycemic control must be weighed against the risks of hypoglycemia.  The pattern of his cbg's indicates he needs some adjustment in his therapy therapy limited by noncompliance with insulin dosing and cbg recording.  i'll do the best i can. Morbid obesity: this complicates the rx of DM.  Renal insufficiency: this increases the risk of hypoglycemia.

## 2013-09-21 NOTE — Patient Instructions (Addendum)
check your blood sugar twice a day.  vary the time of day when you check, between before the 3 meals, and at bedtime.  also check if you have symptoms of your blood sugar being too high or too low.  please keep a record of the readings and bring it to your next appointment here.  please call us sooner if your blood sugar goes below 70, or if you have a lot of readings over 200.   Please change the humalog to 3 times a day (just before each meal) 3 times a day (just before each meal), 35-50-40 units. Please reduce the lantus to 60 unit at bedtime blood tests are being requested for you today.  We'll contact you with results.  Please come back for a follow-up appointment in 1 month.

## 2013-09-28 ENCOUNTER — Ambulatory Visit (INDEPENDENT_AMBULATORY_CARE_PROVIDER_SITE_OTHER): Payer: Commercial Managed Care - HMO | Admitting: Family Medicine

## 2013-09-28 DIAGNOSIS — I2699 Other pulmonary embolism without acute cor pulmonale: Secondary | ICD-10-CM

## 2013-10-10 ENCOUNTER — Encounter: Payer: Self-pay | Admitting: Internal Medicine

## 2013-10-10 ENCOUNTER — Ambulatory Visit (INDEPENDENT_AMBULATORY_CARE_PROVIDER_SITE_OTHER): Payer: Commercial Managed Care - HMO | Admitting: Internal Medicine

## 2013-10-10 VITALS — BP 130/88 | HR 86 | Temp 97.6°F | Ht 74.0 in | Wt >= 6400 oz

## 2013-10-10 DIAGNOSIS — R609 Edema, unspecified: Secondary | ICD-10-CM | POA: Insufficient documentation

## 2013-10-10 DIAGNOSIS — Z0000 Encounter for general adult medical examination without abnormal findings: Secondary | ICD-10-CM

## 2013-10-10 MED ORDER — ALBUTEROL SULFATE HFA 108 (90 BASE) MCG/ACT IN AERS: 2.0000 | INHALATION_SPRAY | RESPIRATORY_TRACT | Status: DC | PRN

## 2013-10-10 MED ORDER — HYDROCHLOROTHIAZIDE 25 MG PO TABS: 25.0000 mg | ORAL_TABLET | Freq: Every morning | ORAL | Status: DC

## 2013-10-10 NOTE — Progress Notes (Signed)
Subjective:    Patient ID: [name], male    DOB: [birth year], 57 y.o.   MRN: [id number]  HPI  Here to f/u; overall doing ok,  Pt denies chest pain, increased sob or doe, wheezing, orthopnea, PND, increased LE swelling, palpitations, dizziness or syncope.  Pt denies polydipsia, polyuria, or low sugar symptoms such as weakness or confusion improved with po intake.  Pt denies new neurological symptoms such as new headache, or facial or extremity weakness or numbness.   Pt states overall good compliance with meds, has been trying to follow lower cholesterol, diabetic diet, with wt overall stable,  but little exercise however. Sees urology with PSA, also sees endo with overall a1c improved recently.  Due for gastric bypass soon,  Pt continues to have recurring LBP without change in severity, bowel or bladder change, fever, wt loss,  worsening LE pain/numbness/weakness, gait change or falls, and pt putting off more surgury for now. Past Medical History  Diagnosis Date  . Diabetes mellitus     type II  . Hyperlipidemia   . Hypertension   . DVT, lower extremity     right  . Embolus 2005    pulmonary  . Depression   . Obesity   . BACK PAIN   . COLONIC POLYPS, HX OF   . DEGENERATIVE JOINT DISEASE, KNEES, BILATERAL   . DEPRESSION   . DIVERTICULOSIS, COLON   . HYPOGONADISM, MALE   . HYPOTENSION, ORTHOSTATIC   . OBESITY, MORBID   . Long term current use of anticoagulant   . Obstructive sleep apnea     uses cpap setting 8 to 10  . Kidney stones   . Renal insufficiency   . RENAL INSUFFICIENCY   . Shortness of breath   . No blood products    Past Surgical History  Procedure Laterality Date  . Knee surgury  2000 & 2003     cartilage damage  . Ankle surgury  2001 bilateral    with bone spurs and torn tendon  . Edg  2008    chronic Duodenitis  . Tonsillectomy and adenoidectomy  age 31 or 7  . Total knee arthroplasty  01/10/2012    Procedure: TOTAL KNEE ARTHROPLASTY;  Surgeon: Shelda Pal, MD;  Location: [facility];  Service: Orthopedics;  Laterality: Left;  . Lumbar laminectomy/decompression microdiscectomy  09/28/2012    Procedure: LUMBAR LAMINECTOMY/DECOMPRESSION MICRODISCECTOMY 2 LEVELS;  Surgeon: Drucilla Schmidt, MD;  Location: [facility];  Service: Orthopedics;  Laterality: Left;  Decompressive laminectomy L4-L5. Microdiscectomy L5-S1  . Heel spur surgery Bilateral 2005  . Replacement total knee  2013    reports that he quit smoking about 34 years ago. He has never used smokeless tobacco. He reports that he drinks alcohol. He reports that he does not use illicit drugs. family history includes Diabetes in his brother and sister; Hypertension in his mother; Prostate cancer in his father. Allergies  Allergen Reactions  . Other     NO BLOOD -JEHOVAH'S WITNESS-SIGNED REFUSAL BUT WOULD TAKE ALBUMIN   Current Outpatient Prescriptions on File Prior to Visit  Medication Sig Dispense Refill  . ACCU-CHEK SOFTCLIX LANCETS lancets Use as directed four times daily to check blood sugar.  Diagnosis code 250.00  300 each  3  . Alcohol Swabs (B-D SINGLE USE SWABS REGULAR) PADS Use as directed  200 each  3  . aspirin (ECOTRIN LOW STRENGTH) 81 MG EC tablet Take 81 mg by mouth daily before breakfast.       .  atorvastatin (LIPITOR) 80 MG tablet Take 80 mg by mouth daily.      . Blood Glucose Monitoring Suppl (ACCU-CHEK AVIVA PLUS) W/DEVICE KIT 1 each by Other route daily. Use as directed to check blood sugar.  1 kit  0  . DULoxetine (CYMBALTA) 60 MG capsule Take 60 mg by mouth daily.      Marland Kitchen glucose blood (ACCU-CHEK AVIVA PLUS) test strip Use as directed four times daily to check blood sugar.  Diagnosis code 250.00  300 each  3  . HYDROcodone-acetaminophen (NORCO/VICODIN) 5-325 MG per tablet Take 1-2 tablets by mouth every 6 (six) hours as needed. For pain      . insulin glargine (LANTUS) 100 UNIT/ML injection Inject 60 Units into the skin at bedtime.       . insulin lispro (HUMALOG) 100  unit/mL SOLN 35 units with breakfast, 50 units at lunch, and 40 units at supper.      . Insulin Pen Needle (BD PEN NEEDLE NANO U/F) 32G X 4 MM MISC USE AS DIRECTED 4 TIMES A DAY  100 each  2  . lisinopril (PRINIVIL,ZESTRIL) 5 MG tablet Take 5 mg by mouth daily.      . meloxicam (MOBIC) 7.5 MG tablet Take 2 tablets (15 mg total) by mouth daily.  30 tablet  0  . phentermine (ADIPEX-P) 37.5 MG tablet Take 37.5 mg by mouth daily before breakfast.      . warfarin (COUMADIN) 5 MG tablet Take 10 mg by mouth daily.       No current facility-administered medications on file prior to visit.   Review of Systems Constitutional: Negative for diaphoresis, activity change, appetite change or unexpected weight change.  HENT: Negative for hearing loss, ear pain, facial swelling, mouth sores and neck stiffness.   Eyes: Negative for pain, redness and visual disturbance.  Respiratory: Negative for shortness of breath and wheezing.   Cardiovascular: Negative for chest pain and palpitations.  Gastrointestinal: Negative for diarrhea, blood in stool, abdominal distention or other pain Genitourinary: Negative for hematuria, flank pain or change in urine volume.  Musculoskeletal: Negative for myalgias and joint swelling.  Skin: Negative for color change and wound.  Neurological: Negative for syncope and numbness. other than noted Hematological: Negative for adenopathy.  Psychiatric/Behavioral: Negative for hallucinations, self-injury, decreased concentration and agitation.      Objective:   Physical Exam BP 130/88  Pulse 86  Temp(Src) 97.6 F (36.4 C) (Oral)  Ht 6\' 2"  (1.88 m)  Wt 411 lb 5 oz (186.57 kg)  BMI 52.79 kg/m2  SpO2 93% VS noted,  Constitutional: Pt is oriented to person, place, and time. Appears well-developed and well-nourished. /morbid obese Head: Normocephalic and atraumatic.  Right Ear: External ear normal.  Left Ear: External ear normal.  Nose: Nose normal.  Mouth/Throat: Oropharynx is  clear and moist.  Eyes: Conjunctivae and EOM are normal. Pupils are equal, round, and reactive to light.  Neck: Normal range of motion. Neck supple. No JVD present. No tracheal deviation present.  Cardiovascular: Normal rate, regular rhythm, normal heart sounds and intact distal pulses.   Pulmonary/Chest: Effort normal and breath sounds normal.  Abdominal: Soft. Bowel sounds are normal. There is no tenderness. No HSM  Musculoskeletal: Normal range of motion. Exhibits no edema.  Lymphadenopathy:  Has no cervical adenopathy.  Neurological: Pt is alert and oriented to person, place, and time. Pt has normal reflexes. No cranial nerve deficit.  Skin: Skin is warm and dry. No rash noted. trace to 1+  edema to mid leg bilat left > right Psychiatric:  Has  normal mood and affect. Behavior is normal.     Assessment & Plan:

## 2013-10-10 NOTE — Assessment & Plan Note (Signed)

## 2013-10-10 NOTE — Patient Instructions (Addendum)
Please continue all other medications as before, and refills have been done if requested - the inhaler Please have the pharmacy call with any other refills you may need. Please continue your efforts at being more active, low cholesterol diet, and weight control. You are otherwise up to date with prevention measures today.  No further blood work is needed today  Please keep your appointments with your specialists as you have planned  Please remember to sign up for My Chart if you have not done so, as this will be important to you in the future with finding out test results, communicating by private email, and scheduling acute appointments online when needed.  Please return in 6 months, or sooner if needed

## 2013-10-18 NOTE — Telephone Encounter (Signed)
Phone call completed ?

## 2013-10-20 ENCOUNTER — Other Ambulatory Visit: Payer: Self-pay | Admitting: Internal Medicine

## 2013-10-22 ENCOUNTER — Other Ambulatory Visit: Payer: Self-pay | Admitting: General Practice

## 2013-10-22 MED ORDER — WARFARIN SODIUM 5 MG PO TABS: ORAL_TABLET | ORAL | Status: DC

## 2013-10-25 ENCOUNTER — Other Ambulatory Visit: Payer: Self-pay | Admitting: Endocrinology

## 2013-10-26 ENCOUNTER — Ambulatory Visit (INDEPENDENT_AMBULATORY_CARE_PROVIDER_SITE_OTHER): Payer: BC Managed Care – PPO | Admitting: General Practice

## 2013-10-26 DIAGNOSIS — I2699 Other pulmonary embolism without acute cor pulmonale: Secondary | ICD-10-CM

## 2013-10-26 LAB — POCT INR: INR: 2.4

## 2013-10-26 NOTE — Progress Notes (Signed)
Pre-visit discussion using our clinic review tool. No additional management support is needed unless otherwise documented below in the visit note.  

## 2013-10-27 ENCOUNTER — Other Ambulatory Visit: Payer: Self-pay | Admitting: Endocrinology

## 2013-10-29 ENCOUNTER — Other Ambulatory Visit: Payer: Self-pay | Admitting: *Deleted

## 2013-10-29 MED ORDER — INSULIN GLARGINE 100 UNIT/ML ~~LOC~~ SOLN: 80.0000 [IU] | Freq: Every day | SUBCUTANEOUS | Status: DC

## 2013-10-29 MED ORDER — "INSULIN SYRINGE 29G X 1/2"" 1 ML MISC": Status: DC

## 2013-11-05 ENCOUNTER — Other Ambulatory Visit: Payer: Self-pay

## 2013-11-05 MED ORDER — INSULIN PEN NEEDLE 32G X 4 MM MISC: Status: DC

## 2013-11-05 NOTE — Telephone Encounter (Signed)
Ultra-Fine Pen refilled 11/05/2013.  [facility]

## 2013-11-08 ENCOUNTER — Other Ambulatory Visit: Payer: Self-pay | Admitting: Endocrinology

## 2013-11-08 ENCOUNTER — Encounter: Payer: Self-pay | Admitting: Endocrinology

## 2013-11-08 MED ORDER — INSULIN ASPART 100 UNIT/ML FLEXPEN: PEN_INJECTOR | SUBCUTANEOUS | Status: DC

## 2013-11-08 MED ORDER — INSULIN DETEMIR 100 UNIT/ML FLEXPEN: 80.0000 [IU] | PEN_INJECTOR | Freq: Every day | SUBCUTANEOUS | Status: DC

## 2013-11-08 NOTE — Telephone Encounter (Signed)
Ok, i changed to [employer] and levemir.  Please note we may need to adjust the units of the new insulins, especially the levemir.  Please let me know if any probs. i'll see you next time.

## 2013-11-13 ENCOUNTER — Telehealth: Payer: Self-pay

## 2013-11-13 NOTE — Telephone Encounter (Signed)
Fax received from [employer] states that Humalog Stephanie Coup is a suggested alternative.

## 2013-11-13 NOTE — Telephone Encounter (Signed)
Fax received from [facility] stating that Levemir has been approved but Novolog has not been approved.  Please advise,   Thanks!

## 2013-11-13 NOTE — Telephone Encounter (Signed)
please call patient: What is alternative to novolog?

## 2013-11-16 MED ORDER — INSULIN LISPRO 100 UNIT/ML (KWIKPEN): PEN_INJECTOR | SUBCUTANEOUS | Status: DC

## 2013-11-16 NOTE — Telephone Encounter (Signed)
i sent rx 

## 2013-11-16 NOTE — Telephone Encounter (Signed)
Please see below,  Thanks 

## 2013-11-16 NOTE — Telephone Encounter (Signed)
Patient informed. 

## 2013-11-22 ENCOUNTER — Encounter (INDEPENDENT_AMBULATORY_CARE_PROVIDER_SITE_OTHER): Payer: Medicare HMO | Admitting: Ophthalmology

## 2013-11-22 DIAGNOSIS — E11319 Type 2 diabetes mellitus with unspecified diabetic retinopathy without macular edema: Secondary | ICD-10-CM

## 2013-11-22 DIAGNOSIS — H43819 Vitreous degeneration, unspecified eye: Secondary | ICD-10-CM

## 2013-11-22 DIAGNOSIS — I10 Essential (primary) hypertension: Secondary | ICD-10-CM

## 2013-11-22 DIAGNOSIS — H33309 Unspecified retinal break, unspecified eye: Secondary | ICD-10-CM

## 2013-11-22 DIAGNOSIS — E1139 Type 2 diabetes mellitus with other diabetic ophthalmic complication: Secondary | ICD-10-CM

## 2013-11-22 DIAGNOSIS — H35039 Hypertensive retinopathy, unspecified eye: Secondary | ICD-10-CM

## 2013-11-22 DIAGNOSIS — H3581 Retinal edema: Secondary | ICD-10-CM

## 2013-11-23 ENCOUNTER — Encounter: Payer: Self-pay | Admitting: Endocrinology

## 2013-11-23 ENCOUNTER — Ambulatory Visit (INDEPENDENT_AMBULATORY_CARE_PROVIDER_SITE_OTHER): Payer: Commercial Managed Care - HMO | Admitting: General Practice

## 2013-11-23 ENCOUNTER — Ambulatory Visit (INDEPENDENT_AMBULATORY_CARE_PROVIDER_SITE_OTHER): Payer: Commercial Managed Care - HMO | Admitting: Endocrinology

## 2013-11-23 VITALS — BP 132/92 | HR 71 | Temp 98.0°F | Ht 74.0 in | Wt >= 6400 oz

## 2013-11-23 DIAGNOSIS — I2699 Other pulmonary embolism without acute cor pulmonale: Secondary | ICD-10-CM

## 2013-11-23 DIAGNOSIS — E1029 Type 1 diabetes mellitus with other diabetic kidney complication: Secondary | ICD-10-CM

## 2013-11-23 NOTE — Progress Notes (Signed)
Subjective:    Patient ID: [name], male    DOB: November 10, 1956, 57 y.o.   MRN: [id number]  HPI Pt returns for f/u of insulin-requiring DM (dx'ed 1996, when he presented with polyuria; he has been on insulin since 2004; he has mild if any neuropathy of the lower extremities, but he has associated renal insufficiency and autonomic neuropathy; he has never had severe hypoglycemia or DKA; he is still working towards bariatric surgery at [facility]).   no cbg record, but states cbg's are mildly low approx once a month.  This happens in am.  It is highest at hs, but seldom over 200.  It varies from 111-160.   Past Medical History  Diagnosis Date  . Diabetes mellitus     type II  . Hyperlipidemia   . Hypertension   . DVT, lower extremity     right  . Embolus 2005    pulmonary  . Depression   . Obesity   . BACK PAIN   . COLONIC POLYPS, HX OF   . DEGENERATIVE JOINT DISEASE, KNEES, BILATERAL   . DEPRESSION   . DIVERTICULOSIS, COLON   . HYPOGONADISM, MALE   . HYPOTENSION, ORTHOSTATIC   . OBESITY, MORBID   . Long term current use of anticoagulant   . Obstructive sleep apnea     uses cpap setting 8 to 10  . Kidney stones   . Renal insufficiency   . RENAL INSUFFICIENCY   . Shortness of breath   . No blood products     Past Surgical History  Procedure Laterality Date  . Knee surgury  2000 & 2003     cartilage damage  . Ankle surgury  2001 bilateral    with bone spurs and torn tendon  . Edg  2008    chronic Duodenitis  . Tonsillectomy and adenoidectomy  age 43 or 46  . Total knee arthroplasty  01/10/2012    Procedure: TOTAL KNEE ARTHROPLASTY;  Surgeon: Shelda Pal, MD;  Location: [facility];  Service: Orthopedics;  Laterality: Left;  . Lumbar laminectomy/decompression microdiscectomy  09/28/2012    Procedure: LUMBAR LAMINECTOMY/DECOMPRESSION MICRODISCECTOMY 2 LEVELS;  Surgeon: Drucilla Schmidt, MD;  Location: [facility];  Service: Orthopedics;  Laterality: Left;  Decompressive  laminectomy L4-L5. Microdiscectomy L5-S1  . Heel spur surgery Bilateral 2005  . Replacement total knee  2013    History   Social History  . Marital Status: Single    Spouse Name: N/A    Number of Children: 2  . Years of Education: N/A   Occupational History  . back at work at [employer] since Jan. 2010    Social History Main Topics  . Smoking status: Former Smoker -- 0.25 packs/day for 5 years    Quit date: 12/06/1978  . Smokeless tobacco: Never Used  . Alcohol Use: Yes     Comment: occasional  . Drug Use: No  . Sexual Activity: Not Currently   Other Topics Concern  . Not on file   Social History Narrative   Daily caffeine use one per day    Current Outpatient Prescriptions on File Prior to Visit  Medication Sig Dispense Refill  . ACCU-CHEK SOFTCLIX LANCETS lancets Use as directed four times daily to check blood sugar.  Diagnosis code 250.00  300 each  3  . albuterol (PROAIR HFA) 108 (90 BASE) MCG/ACT inhaler Inhale 2 puffs into the lungs every 4 (four) hours as needed. For shortness of breath  1  Inhaler  11  . Alcohol Swabs (B-D SINGLE USE SWABS REGULAR) PADS Use as directed  200 each  3  . aspirin (ECOTRIN LOW STRENGTH) 81 MG EC tablet Take 81 mg by mouth daily before breakfast.       . atorvastatin (LIPITOR) 80 MG tablet Take 80 mg by mouth daily.      . Blood Glucose Monitoring Suppl (ACCU-CHEK AVIVA PLUS) W/DEVICE KIT 1 each by Other route daily. Use as directed to check blood sugar.  1 kit  0  . DULoxetine (CYMBALTA) 60 MG capsule Take 60 mg by mouth daily.      Marland Kitchen glucose blood (ACCU-CHEK AVIVA PLUS) test strip Use as directed four times daily to check blood sugar.  Diagnosis code 250.00  300 each  3  . hydrochlorothiazide (HYDRODIURIL) 25 MG tablet Take 1 tablet (25 mg total) by mouth every morning.  90 tablet  3  . HYDROcodone-acetaminophen (NORCO/VICODIN) 5-325 MG per tablet Take 1-2 tablets by mouth every 6 (six) hours as needed. For pain      .  insulin lispro (HUMALOG) 100 unit/mL SOLN 35 units with breakfast, 50 units at lunch, and 50 units at supper.      . Insulin Pen Needle (BD PEN NEEDLE NANO U/F) 32G X 4 MM MISC USE AS DIRECTED 4 TIMES A DAY  100 each  2  . INSULIN SYRINGE 1CC/29G 29G X 1/2" 1 ML MISC Use 1 time daily as instructed.  100 each  1  . lisinopril (PRINIVIL,ZESTRIL) 5 MG tablet Take 5 mg by mouth daily.      . meloxicam (MOBIC) 7.5 MG tablet Take 2 tablets (15 mg total) by mouth daily.  30 tablet  0  . phentermine (ADIPEX-P) 37.5 MG tablet Take 37.5 mg by mouth daily before breakfast.      . warfarin (COUMADIN) 5 MG tablet Take as directed by anticoagulation clinic  180 tablet  0   No current facility-administered medications on file prior to visit.    Allergies  Allergen Reactions  . Other     NO BLOOD -JEHOVAH'S WITNESS-SIGNED REFUSAL BUT WOULD TAKE ALBUMIN    Family History  Problem Relation Age of Onset  . Hypertension Mother   . Prostate cancer Father   . Diabetes Sister     died with DM 05/08/01, 2 more sisters with diabetes  . Diabetes Brother    BP 132/92  Pulse 71  Temp(Src) 98 F (36.7 C) (Oral)  Ht 6\' 2"  (1.88 m)  Wt 413 lb (187.336 kg)  BMI 53.00 kg/m2  SpO2 97%  Review of Systems Denies LOC and weight change.      Objective:   Physical Exam VITAL SIGNS:  See vs page GENERAL: no distress  Lab Results  Component Value Date   HGBA1C 10.1* 11/23/2013      Assessment & Plan:  DM: This insulin regimen was chosen from multiple options, as it best matches his insulin to his changing requirements throughout the day.  However, he needs a simpler insulin regimen.  The benefits of glycemic control must be weighed against the risks of hypoglycemia.   Morbid obesity: this complicates the rx of DM.  Renal insufficiency: this increases the risk of hypoglycemia.

## 2013-11-23 NOTE — Progress Notes (Signed)
Pre-visit discussion using our clinic review tool. No additional management support is needed unless otherwise documented below in the visit note.  

## 2013-11-23 NOTE — Patient Instructions (Addendum)
check your blood sugar twice a day.  vary the time of day when you check, between before the 3 meals, and at bedtime.  also check if you have symptoms of your blood sugar being too high or too low.  please keep a record of the readings and bring it to your next appointment here.  please call us sooner if your blood sugar goes below 70, or if you have a lot of readings over 200.   Please change the humalog to 3 times a day (just before each meal) 3 times a day (just before each meal), 35-50-50 units. Please reduce the levemir to 50 units at bedtime.  blood tests are being requested for you today.  We'll contact you with results.  If it is still very high today, we should consider changing to just the levemir, each morning, and stopping the humalog.  Please come back for a follow-up appointment in 1 month.

## 2013-11-24 ENCOUNTER — Encounter: Payer: Self-pay | Admitting: Endocrinology

## 2013-11-26 ENCOUNTER — Other Ambulatory Visit: Payer: Self-pay | Admitting: *Deleted

## 2013-11-26 ENCOUNTER — Other Ambulatory Visit: Payer: Self-pay | Admitting: Endocrinology

## 2013-11-26 MED ORDER — INSULIN PEN NEEDLE 32G X 4 MM MISC: Status: DC

## 2013-11-26 MED ORDER — INSULIN DETEMIR 100 UNIT/ML FLEXPEN: 160.0000 [IU] | PEN_INJECTOR | SUBCUTANEOUS | Status: DC

## 2013-12-04 ENCOUNTER — Telehealth: Payer: Self-pay | Admitting: *Deleted

## 2013-12-04 NOTE — Telephone Encounter (Signed)
Called the patient informed of MD instructions. 

## 2013-12-04 NOTE — Telephone Encounter (Signed)
Already on the higher dose hctz, ok to cont meds as before, work on leg elevation, low salt diet

## 2013-12-04 NOTE — Telephone Encounter (Signed)
Pt called states he is out of town and is having leg and foot swelling.  Pt is requesting a higher dose of Hydrochlorothiazide until he returns.  Please send Rx to [facility] [phone] phone.  Please advise

## 2013-12-04 NOTE — Telephone Encounter (Signed)
Called the patient left message to call back 

## 2013-12-13 ENCOUNTER — Ambulatory Visit (INDEPENDENT_AMBULATORY_CARE_PROVIDER_SITE_OTHER): Payer: Medicare Other | Admitting: Ophthalmology

## 2013-12-13 DIAGNOSIS — H33309 Unspecified retinal break, unspecified eye: Secondary | ICD-10-CM

## 2013-12-14 ENCOUNTER — Telehealth: Payer: Self-pay | Admitting: Internal Medicine

## 2013-12-14 NOTE — Telephone Encounter (Signed)
Patient called stating he has spoken with doctor about referral to [facility] would like for the referral to be put in  Please advise

## 2013-12-14 NOTE — Telephone Encounter (Signed)
Pt was last seen by me 2 mo ago; i would need to know what speciality to which he refers

## 2013-12-18 ENCOUNTER — Encounter: Payer: Self-pay | Admitting: Endocrinology

## 2013-12-18 NOTE — Telephone Encounter (Signed)
Called pt to verify what type of referral. Pt states its for weight loss management Biactric surgery Dr. Eldridge Abrahams...[name]

## 2013-12-18 NOTE — Telephone Encounter (Signed)
Ok for referral?

## 2013-12-19 DIAGNOSIS — E1169 Type 2 diabetes mellitus with other specified complication: Secondary | ICD-10-CM | POA: Insufficient documentation

## 2013-12-19 DIAGNOSIS — K219 Gastro-esophageal reflux disease without esophagitis: Secondary | ICD-10-CM | POA: Insufficient documentation

## 2013-12-21 ENCOUNTER — Encounter (INDEPENDENT_AMBULATORY_CARE_PROVIDER_SITE_OTHER): Payer: Medicare Other

## 2013-12-21 DIAGNOSIS — I2699 Other pulmonary embolism without acute cor pulmonale: Secondary | ICD-10-CM

## 2013-12-25 ENCOUNTER — Other Ambulatory Visit: Payer: Self-pay | Admitting: General Practice

## 2013-12-25 ENCOUNTER — Ambulatory Visit (INDEPENDENT_AMBULATORY_CARE_PROVIDER_SITE_OTHER): Payer: Medicare Other | Admitting: General Practice

## 2013-12-25 DIAGNOSIS — I2699 Other pulmonary embolism without acute cor pulmonale: Secondary | ICD-10-CM

## 2013-12-25 LAB — POCT INR: INR: 2

## 2013-12-25 MED ORDER — WARFARIN SODIUM 5 MG PO TABS: ORAL_TABLET | ORAL | Status: DC

## 2013-12-25 NOTE — Progress Notes (Signed)
Pre-visit discussion using our clinic review tool. No additional management support is needed unless otherwise documented below in the visit note.  

## 2014-01-01 ENCOUNTER — Encounter: Payer: Self-pay | Admitting: Endocrinology

## 2014-01-02 ENCOUNTER — Telehealth: Payer: Self-pay | Admitting: Internal Medicine

## 2014-01-02 NOTE — Telephone Encounter (Signed)
Spoke to therapist at [facility] for this pt being eval as part of bariatric surgury mental health clearance  Pt endorses persistent low mood chronic recently despite the cymbalta, no SI or HI, now with divorce pending and dealing with IRS for money owed   [place] to contact pt - to offer appt, may need change of antidepressant, and/or counseling referral

## 2014-01-03 ENCOUNTER — Emergency Department (HOSPITAL_COMMUNITY)
Admission: EM | Admit: 2014-01-03 | Discharge: 2014-01-03 | Disposition: A | Discharge status: Home / Self Care | Payer: Medicare Other | Source: Home / Self Care | Attending: Family Medicine | Admitting: Family Medicine

## 2014-01-03 ENCOUNTER — Emergency Department (INDEPENDENT_AMBULATORY_CARE_PROVIDER_SITE_OTHER): Payer: Medicare Other

## 2014-01-03 ENCOUNTER — Encounter (HOSPITAL_COMMUNITY): Payer: Self-pay | Admitting: Emergency Medicine

## 2014-01-03 DIAGNOSIS — J069 Acute upper respiratory infection, unspecified: Secondary | ICD-10-CM

## 2014-01-03 MED ORDER — GUAIFENESIN-CODEINE 100-10 MG/5ML PO SYRP: 10.0000 mL | ORAL_SOLUTION | Freq: Four times a day (QID) | ORAL | Status: DC | PRN

## 2014-01-03 MED ORDER — AZITHROMYCIN 250 MG PO TABS: ORAL_TABLET | ORAL | Status: DC

## 2014-01-03 NOTE — ED Provider Notes (Signed)
CSN: [id number]     Arrival date & time 01/03/14  02/12/07 History   First MD Initiated Contact with Patient 01/03/14 1701     Chief Complaint  Patient presents with  . Cough   (Consider location/radiation/quality/duration/timing/severity/associated sxs/prior Treatment) Patient is a 58 y.o. male presenting with cough. The history is provided by the patient.  Cough Cough characteristics:  Non-productive, dry and harsh Severity:  Moderate Onset quality:  Sudden Duration:  2 days Progression:  Unchanged Chronicity:  New Smoker: no   Context: upper respiratory infection   Ineffective treatments:  Cough suppressants Associated symptoms: shortness of breath   Associated symptoms: no chills, no fever and no rhinorrhea     Past Medical History  Diagnosis Date  . Diabetes mellitus     type II  . Hyperlipidemia   . Hypertension   . DVT, lower extremity     right  . Embolus 2004-02-13    pulmonary  . Depression   . Obesity   . BACK PAIN   . COLONIC POLYPS, HX OF   . DEGENERATIVE JOINT DISEASE, KNEES, BILATERAL   . DEPRESSION   . DIVERTICULOSIS, COLON   . HYPOGONADISM, MALE   . HYPOTENSION, ORTHOSTATIC   . OBESITY, MORBID   . Long term current use of anticoagulant   . Obstructive sleep apnea     uses cpap setting 8 to 10  . Kidney stones   . Renal insufficiency   . RENAL INSUFFICIENCY   . Shortness of breath   . No blood products    Past Surgical History  Procedure Laterality Date  . Knee surgury  02/12/99 & 2002/02/12     cartilage damage  . Ankle surgury  2000/02/13 bilateral    with bone spurs and torn tendon  . Edg  Feb 12, 2007    chronic Duodenitis  . Tonsillectomy and adenoidectomy  age 27 or 19  . Total knee arthroplasty  01/10/2012    Procedure: TOTAL KNEE ARTHROPLASTY;  Surgeon: [name], MD;  Location: [facility];  Service: Orthopedics;  Laterality: Left;  . Lumbar laminectomy/decompression microdiscectomy  09/28/2012    Procedure: LUMBAR LAMINECTOMY/DECOMPRESSION MICRODISCECTOMY 2 LEVELS;   Surgeon: [name], MD;  Location: [facility];  Service: Orthopedics;  Laterality: Left;  Decompressive laminectomy L4-L5. Microdiscectomy L5-S1  . Heel spur surgery Bilateral 02/13/2004  . Replacement total knee  Feb 13, 2012   Family History  Problem Relation Age of Onset  . Hypertension Mother   . Prostate cancer Father   . Diabetes Sister     died with DM 12-Feb-2001, 2 more sisters with diabetes  . Diabetes Brother    History  Substance Use Topics  . Smoking status: Former Smoker -- 0.25 packs/day for 5 years    Quit date: 12/06/1978  . Smokeless tobacco: Never Used  . Alcohol Use: Yes     Comment: occasional    Review of Systems  Constitutional: Negative for fever and chills.  HENT: Negative for congestion, postnasal drip and rhinorrhea.   Respiratory: Positive for cough and shortness of breath.   Gastrointestinal: Negative.   Genitourinary: Negative.     Allergies  Other  Home Medications   Current Outpatient Rx  Name  Route  Sig  Dispense  Refill  . ACCU-CHEK SOFTCLIX LANCETS lancets      Use as directed four times daily to check blood sugar.  Diagnosis code 250.00   300 each   3   . albuterol (PROAIR HFA) 108 (90 BASE) MCG/ACT inhaler  Inhalation   Inhale 2 puffs into the lungs every 4 (four) hours as needed. For shortness of breath   1 Inhaler   11   . Alcohol Swabs (B-D SINGLE USE SWABS REGULAR) PADS      Use as directed   200 each   3   . aspirin (ECOTRIN LOW STRENGTH) 81 MG EC tablet   Oral   Take 81 mg by mouth daily before breakfast.          . atorvastatin (LIPITOR) 80 MG tablet   Oral   Take 80 mg by mouth daily.         Marland Kitchen azithromycin (ZITHROMAX Z-PAK) 250 MG tablet      Take as directed on pack   6 each   0   . Blood Glucose Monitoring Suppl (ACCU-CHEK AVIVA PLUS) W/DEVICE KIT   Other   1 each by Other route daily. Use as directed to check blood sugar.   1 kit   0   . DULoxetine (CYMBALTA) 60 MG capsule   Oral   Take 60 mg by mouth  daily.         Marland Kitchen glucose blood (ACCU-CHEK AVIVA PLUS) test strip      Use as directed four times daily to check blood sugar.  Diagnosis code 250.00   300 each   3   . guaiFENesin-codeine (ROBITUSSIN AC) 100-10 MG/5ML syrup   Oral   Take 10 mLs by mouth 4 (four) times daily as needed for cough.   180 mL   0   . hydrochlorothiazide (HYDRODIURIL) 25 MG tablet   Oral   Take 1 tablet (25 mg total) by mouth every morning.   90 tablet   3   . HYDROcodone-acetaminophen (NORCO/VICODIN) 5-325 MG per tablet   Oral   Take 1-2 tablets by mouth every 6 (six) hours as needed. For pain         . Insulin Detemir 100 UNIT/ML SOPN   Subcutaneous   Inject 160 Units into the skin every morning.   20 pen   11   . Insulin Pen Needle (BD PEN NEEDLE NANO U/F) 32G X 4 MM MISC      USE AS DIRECTED 4 TIMES A DAY   100 each   5   . lisinopril (PRINIVIL,ZESTRIL) 5 MG tablet   Oral   Take 5 mg by mouth daily.         . meloxicam (MOBIC) 7.5 MG tablet   Oral   Take 2 tablets (15 mg total) by mouth daily.   30 tablet   0   . phentermine (ADIPEX-P) 37.5 MG tablet   Oral   Take 37.5 mg by mouth daily before breakfast.         . warfarin (COUMADIN) 5 MG tablet      Take as directed by anticoagulation clinic   180 tablet   0     90 day supply    BP 136/66  Pulse 85  Temp(Src) 99.9 F (37.7 C) (Oral)  Resp 26  SpO2 97% Physical Exam  Nursing note and vitals reviewed. Constitutional: He is oriented to person, place, and time. He appears well-developed and well-nourished. He appears distressed.  HENT:  Head: Normocephalic.  Right Ear: External ear normal.  Left Ear: External ear normal.  Mouth/Throat: Oropharynx is clear and moist.  Eyes: Conjunctivae are normal. Pupils are equal, round, and reactive to light.  Neck: Normal range of motion. Neck supple.  Cardiovascular:  Normal rate, regular rhythm, normal heart sounds and intact distal pulses.   Pulmonary/Chest: Breath  sounds normal. He has no rales.  Abdominal: Soft. Bowel sounds are normal.  Neurological: He is alert and oriented to person, place, and time.  Skin: Skin is warm and dry.    ED Course  Procedures (including critical care time) Labs Review Labs Reviewed - No data to display Imaging Review Dg Chest 2 View  01/03/2014   CLINICAL DATA:  Sick for 2 days, cough, shortness of breath  EXAM: CHEST  2 VIEW  COMPARISON:  CT ANGIO CHEST W/CM &/OR WO/CM dated 09/11/2013; DG CHEST 2 VIEW dated 09/11/2013; DG CHEST 2 VIEW dated 01/04/2012  FINDINGS: The examination is degraded due to patient body habitus.  Grossly unchanged borderline enlarged cardiac silhouette and mediastinal contours. Minimal worsening of mild diffuse slightly nodular thickening of the pulmonary interstitium. No discrete focal airspace opacities. No pleural effusion or pneumothorax. No definite evidence of edema. Grossly unchanged bones. Punctate radiopaque structures within the upper abdomen may represent ingested debris.  IMPRESSION: Decreased lung volumes with findings suggestive of airways disease/bronchitis. No discrete focal airspace opacities to suggest pneumonia.   Electronically Signed   By: Sandi Mariscal M.D.   On: 01/03/2014 17:55      MDM  X-rays reviewed and report per radiologist.     Billy Fischer, MD 01/03/14 [phone]

## 2014-01-03 NOTE — Discharge Instructions (Signed)
Drink plenty of fluids as discussed, use medicine as prescribed, and mucinex  for cough. See your doctor if further problems

## 2014-01-03 NOTE — ED Notes (Signed)
C/o dry cough for two days  OTC cough syrup and cough drop was used as Tx

## 2014-01-04 ENCOUNTER — Encounter: Payer: Self-pay | Admitting: Internal Medicine

## 2014-01-04 ENCOUNTER — Ambulatory Visit (INDEPENDENT_AMBULATORY_CARE_PROVIDER_SITE_OTHER): Payer: Medicare Other | Admitting: Internal Medicine

## 2014-01-04 ENCOUNTER — Other Ambulatory Visit: Payer: Self-pay

## 2014-01-04 VITALS — BP 128/80 | HR 85 | Temp 98.9°F | Ht 74.0 in | Wt >= 6400 oz

## 2014-01-04 DIAGNOSIS — J209 Acute bronchitis, unspecified: Secondary | ICD-10-CM | POA: Insufficient documentation

## 2014-01-04 DIAGNOSIS — F3289 Other specified depressive episodes: Secondary | ICD-10-CM

## 2014-01-04 DIAGNOSIS — F329 Major depressive disorder, single episode, unspecified: Secondary | ICD-10-CM

## 2014-01-04 DIAGNOSIS — E1029 Type 1 diabetes mellitus with other diabetic kidney complication: Secondary | ICD-10-CM

## 2014-01-04 DIAGNOSIS — R062 Wheezing: Secondary | ICD-10-CM

## 2014-01-04 MED ORDER — PREDNISONE 10 MG PO TABS: ORAL_TABLET | ORAL | Status: DC

## 2014-01-04 MED ORDER — ESCITALOPRAM OXALATE 10 MG PO TABS: 10.0000 mg | ORAL_TABLET | Freq: Every day | ORAL | Status: DC

## 2014-01-04 MED ORDER — INSULIN PEN NEEDLE 32G X 4 MM MISC: Status: DC

## 2014-01-04 MED ORDER — METHYLPREDNISOLONE ACETATE 80 MG/ML IJ SUSP
80.0000 mg | Freq: Once | INTRAMUSCULAR | Status: AC
  Administered 2014-01-04: 80 mg via INTRAMUSCULAR

## 2014-01-04 NOTE — Assessment & Plan Note (Signed)
For depomedrol IM, predpack asd, alb MDI prn,  to f/u any worsening symptoms or concerns

## 2014-01-04 NOTE — Assessment & Plan Note (Signed)
Mild to mod, for antibx course,  to f/u any worsening symptoms or concerns  - cont zpack and cough med

## 2014-01-04 NOTE — Progress Notes (Signed)
Pre-visit discussion using our clinic review tool. No additional management support is needed unless otherwise documented below in the visit note.  

## 2014-01-04 NOTE — Assessment & Plan Note (Signed)
Mild to mod persistent, prob major depression, for change cymbalta to lexapro 10 qd, refer counseling

## 2014-01-04 NOTE — Patient Instructions (Addendum)
You had the steroid shot today  OK to stop the cymbalta  Please take all new medication as prescribed - the prednisone, and lexapro 10 mg per day  You are also given the Xopenex inhaler to use at 2 puffs four times per day as needed for wheezing and shortness of breath (and Robin to give the written instructions as well)  Please continue all other medications as before, including the zpack, and the cough medicine No further xray or lab needed today  You will be contacted regarding the referral for: counseling/psychology - to see [facility] before leaving today  Please remember to sign up for My Chart if you have not done so, as this will be important to you in the future with finding out test results, communicating by private email, and scheduling acute appointments online when needed.  Please return in 6 months, or sooner if needed, with Lab testing done 3-5 days before

## 2014-01-04 NOTE — Telephone Encounter (Signed)
Appt on Jan. 30 at 10:15.

## 2014-01-04 NOTE — Assessment & Plan Note (Signed)
To cont to monitor cbgs for > 200 or onset polys with steroid tx

## 2014-01-04 NOTE — Progress Notes (Signed)
Subjective:    Patient ID: [name], male    DOB: 10/18/1956, 58 y.o.   MRN: [id number]  HPI  Here after being seen jna 28 per psychology/WF for purpose of eval prior to bariatric surgury, found to endorse persistent depression symptoms at least mild to mod, without SI or HI, despite good compliance with cymbalta 60 mg,has had ongoing divorce issues and IRS difficulty as well.  He is amenable to trial med change, and therapy referral. No worsening ETOH or other substance abuse, cont; to smoke  Also incidentally seen yest at [facility] for infectious symtpoms, tx with zpack and cough med; had normal lung sounds then, but since then has worsened with significant increased now prod cough, wheezing, sob/doe mild. Pt denies chest pain, orthopnea, PND, increased LE swelling, palpitations, dizziness or syncope.  Past Medical History  Diagnosis Date  . Diabetes mellitus     type II  . Hyperlipidemia   . Hypertension   . DVT, lower extremity     right  . Embolus 2005    pulmonary  . Depression   . Obesity   . BACK PAIN   . COLONIC POLYPS, HX OF   . DEGENERATIVE JOINT DISEASE, KNEES, BILATERAL   . DEPRESSION   . DIVERTICULOSIS, COLON   . HYPOGONADISM, MALE   . HYPOTENSION, ORTHOSTATIC   . OBESITY, MORBID   . Long term current use of anticoagulant   . Obstructive sleep apnea     uses cpap setting 8 to 10  . Kidney stones   . Renal insufficiency   . RENAL INSUFFICIENCY   . Shortness of breath   . No blood products    Past Surgical History  Procedure Laterality Date  . Knee surgury  2000 & 2003     cartilage damage  . Ankle surgury  2001 bilateral    with bone spurs and torn tendon  . Edg  2008    chronic Duodenitis  . Tonsillectomy and adenoidectomy  age 62 or 7  . Total knee arthroplasty  01/10/2012    Procedure: TOTAL KNEE ARTHROPLASTY;  Surgeon: [name], MD;  Location: [facility];  Service: Orthopedics;  Laterality: Left;  . Lumbar laminectomy/decompression microdiscectomy   09/28/2012    Procedure: LUMBAR LAMINECTOMY/DECOMPRESSION MICRODISCECTOMY 2 LEVELS;  Surgeon: [name], MD;  Location: [facility];  Service: Orthopedics;  Laterality: Left;  Decompressive laminectomy L4-L5. Microdiscectomy L5-S1  . Heel spur surgery Bilateral 2005  . Replacement total knee  2013    reports that he quit smoking about 35 years ago. He has never used smokeless tobacco. He reports that he drinks alcohol. He reports that he does not use illicit drugs. family history includes Diabetes in his brother and sister; Hypertension in his mother; Prostate cancer in his father. Allergies  Allergen Reactions  . Other     NO BLOOD -JEHOVAH'S WITNESS-SIGNED REFUSAL BUT WOULD TAKE ALBUMIN   Current Outpatient Prescriptions on File Prior to Visit  Medication Sig Dispense Refill  . ACCU-CHEK SOFTCLIX LANCETS lancets Use as directed four times daily to check blood sugar.  Diagnosis code 250.00  300 each  3  . albuterol (PROAIR HFA) 108 (90 BASE) MCG/ACT inhaler Inhale 2 puffs into the lungs every 4 (four) hours as needed. For shortness of breath  1 Inhaler  11  . Alcohol Swabs (B-D SINGLE USE SWABS REGULAR) PADS Use as directed  200 each  3  . aspirin (ECOTRIN LOW STRENGTH) 81 MG EC tablet Take  81 mg by mouth daily before breakfast.       . atorvastatin (LIPITOR) 80 MG tablet Take 80 mg by mouth daily.      Marland Kitchen azithromycin (ZITHROMAX Z-PAK) 250 MG tablet Take as directed on pack  6 each  0  . Blood Glucose Monitoring Suppl (ACCU-CHEK AVIVA PLUS) W/DEVICE KIT 1 each by Other route daily. Use as directed to check blood sugar.  1 kit  0  . DULoxetine (CYMBALTA) 60 MG capsule Take 60 mg by mouth daily.      Marland Kitchen glucose blood (ACCU-CHEK AVIVA PLUS) test strip Use as directed four times daily to check blood sugar.  Diagnosis code 250.00  300 each  3  . guaiFENesin-codeine (ROBITUSSIN AC) 100-10 MG/5ML syrup Take 10 mLs by mouth 4 (four) times daily as needed for cough.  180 mL  0  .  hydrochlorothiazide (HYDRODIURIL) 25 MG tablet Take 1 tablet (25 mg total) by mouth every morning.  90 tablet  3  . HYDROcodone-acetaminophen (NORCO/VICODIN) 5-325 MG per tablet Take 1-2 tablets by mouth every 6 (six) hours as needed. For pain      . Insulin Detemir 100 UNIT/ML SOPN Inject 160 Units into the skin every morning.  20 pen  11  . Insulin Pen Needle (BD PEN NEEDLE NANO U/F) 32G X 4 MM MISC USE AS DIRECTED 4 TIMES A DAY  100 each  5  . lisinopril (PRINIVIL,ZESTRIL) 5 MG tablet Take 5 mg by mouth daily.      . meloxicam (MOBIC) 7.5 MG tablet Take 2 tablets (15 mg total) by mouth daily.  30 tablet  0  . phentermine (ADIPEX-P) 37.5 MG tablet Take 37.5 mg by mouth daily before breakfast.      . warfarin (COUMADIN) 5 MG tablet Take as directed by anticoagulation clinic  180 tablet  0   No current facility-administered medications on file prior to visit.   Review of Systems  Constitutional: Negative for unexpected weight change, or unusual diaphoresis  HENT: Negative for tinnitus.   Eyes: Negative for photophobia and visual disturbance.  Respiratory: Negative for choking and stridor.   Gastrointestinal: Negative for vomiting and blood in stool.  Genitourinary: Negative for hematuria and decreased urine volume.  Musculoskeletal: Negative for acute joint swelling Skin: Negative for color change and wound.  Neurological: Negative for tremors and numbness other than noted  Psychiatric/Behavioral: Negative for decreased concentration or  hyperactivity.       Objective:   Physical Exam BP 128/80  Pulse 85  Temp(Src) 98.9 F (37.2 C) (Oral)  Ht '6\' 2"'  (1.88 m)  Wt 417 lb 6 oz (189.32 kg)  BMI 53.56 kg/m2  SpO2 92% VS noted, mild ill, nontoxic Constitutional: Pt appears well-developed and well-nourished.  HENT: Head: NCAT.  Right Ear: External ear normal.  Left Ear: External ear normal.  Bilat tm's with mild erythema.  Max sinus areas mild tender.  Pharynx with mild erythema, no  exudate Eyes: Conjunctivae and EOM are normal. Pupils are equal, round, and reactive to light.  Neck: Normal range of motion. Neck supple.  Cardiovascular: Normal rate and regular rhythm.   Pulmonary/Chest: Effort normal and breath sounds decreased bilat with bilat wheezes/rhonchi.  Neurological: Pt is alert. Not confused  Skin: Skin is warm. No erythema.  Psychiatric: Pt behavior is normal. Thought content normal. + depressed affect, not nervous appearing     Assessment & Plan:

## 2014-01-08 ENCOUNTER — Telehealth: Payer: Self-pay

## 2014-01-08 NOTE — Telephone Encounter (Signed)
Relevant patient education assigned to patient using Emmi. ° °

## 2014-01-09 ENCOUNTER — Encounter: Payer: Self-pay | Admitting: Physician Assistant

## 2014-01-09 ENCOUNTER — Ambulatory Visit (INDEPENDENT_AMBULATORY_CARE_PROVIDER_SITE_OTHER): Payer: Medicare Other | Admitting: Physician Assistant

## 2014-01-09 VITALS — BP 122/68 | HR 94 | Temp 98.5°F | Resp 18 | Ht 74.0 in | Wt >= 6400 oz

## 2014-01-09 DIAGNOSIS — J209 Acute bronchitis, unspecified: Secondary | ICD-10-CM

## 2014-01-09 MED ORDER — LEVOFLOXACIN 250 MG PO TABS: 250.0000 mg | ORAL_TABLET | Freq: Every day | ORAL | Status: DC

## 2014-01-09 NOTE — Progress Notes (Signed)
Subjective:    Patient ID: [name], male    DOB: [birth year], 58 y.o.   MRN: [id number]  HPI Comments: Patient is a 58 year old male who presents to the clinic with complaint of cough. Patient reports he was seen in the ED 7 days prior to arrival for same complaint, had negative chest xray, provided cough suppressant and Z-pack. Next day seen by PCP for evaluation of cough and wheezing and given steroid shot and instructed to use inhaler as needed. Patient reports originally cough was dry, now more frequent and coughing up mucus at timesand wheezing has decreased.Marland Kitchen Has finished z-pack, still using cough suppressant and inhaler. Is concerned not going away. Denies pain/difficulty swallowing, chest pain, SOB, N/V/F/C.   Review of Systems  Constitutional: Negative for fever and chills.  HENT: Positive for congestion and rhinorrhea. Negative for ear pain and sinus pressure.   Respiratory: Positive for cough. Negative for chest tightness and shortness of breath.   Cardiovascular: Negative for chest pain and palpitations.  Gastrointestinal: Negative for nausea and vomiting.  Neurological: Negative for dizziness and light-headedness.  All other systems reviewed and are negative.   Past Medical History  Diagnosis Date  . Diabetes mellitus     type II  . Hyperlipidemia   . Hypertension   . DVT, lower extremity     right  . Embolus 2005    pulmonary  . Depression   . Obesity   . BACK PAIN   . COLONIC POLYPS, HX OF   . DEGENERATIVE JOINT DISEASE, KNEES, BILATERAL   . DEPRESSION   . DIVERTICULOSIS, COLON   . HYPOGONADISM, MALE   . HYPOTENSION, ORTHOSTATIC   . OBESITY, MORBID   . Long term current use of anticoagulant   . Obstructive sleep apnea     uses cpap setting 8 to 10  . Kidney stones   . Renal insufficiency   . RENAL INSUFFICIENCY   . Shortness of breath   . No blood products       Objective:   Physical Exam  Vitals reviewed. Constitutional: He is oriented to  person, place, and time. He appears well-developed and well-nourished. No distress.  HENT:  Head: Normocephalic and atraumatic.  Right Ear: Hearing, tympanic membrane, external ear and ear canal normal.  Left Ear: Hearing, tympanic membrane, external ear and ear canal normal.  Nose: Rhinorrhea present. Right sinus exhibits no maxillary sinus tenderness and no frontal sinus tenderness. Left sinus exhibits no maxillary sinus tenderness and no frontal sinus tenderness.  Mouth/Throat: Uvula is midline and mucous membranes are normal. Oropharyngeal exudate (clear postnasal drainage present) present. No posterior oropharyngeal edema or posterior oropharyngeal erythema.  Eyes: Conjunctivae are normal.  Neck: Normal range of motion.  Cardiovascular: Normal rate and regular rhythm.   Pulmonary/Chest: Effort normal and breath sounds normal. He has no wheezes. He has no rhonchi. He has no rales.  Musculoskeletal: Normal range of motion.  Lymphadenopathy:    He has no cervical adenopathy.       Right: No supraclavicular adenopathy present.       Left: No supraclavicular adenopathy present.  Neurological: He is alert and oriented to person, place, and time.  Skin: Skin is warm and dry. He is not diaphoretic.  Psychiatric: He has a normal mood and affect.    Filed Vitals:   01/09/14 1617  BP: 122/68  Pulse: 94  Temp: 98.5 F (36.9 C)  Resp: 18      Assessment & Plan:  Bronchitis, acute Rx for Levaquin 250 mg one tab daily for 10 days. Continue using cough syrup for cough suppression as needed Continue use of inhaler as needed for cough/wheeze  RTO if no improvement of symptoms

## 2014-01-09 NOTE — Patient Instructions (Signed)
It was great meeting you today Mr. Pitta!   Bronchitis Bronchitis is swelling (inflammation) of the air tubes leading to your lungs (bronchi). This causes mucus and a cough. If the swelling gets bad, you may have trouble breathing. HOME CARE   Rest.  Drink enough fluids to keep your pee (urine) clear or pale yellow (unless you have a condition where you have to watch how much you drink).  Only take medicine as told by your doctor. If you were given antibiotic medicines, finish them even if you start to feel better.  Avoid smoke, irritating chemicals, and strong smells. These make the problem worse. Quit smoking if you smoke. This helps your lungs heal faster.  Use a cool mist humidifier. Change the water in the humidifier every day. You can also sit in the bathroom with hot shower running for 5 10 minutes. Keep the door closed.  See your health care provider as told.  Wash your hands often. GET HELP IF: Your problems do not get better after 1 week. GET HELP RIGHT AWAY IF:   Your fever gets worse.  You have chills.  Your chest hurts.  Your problems breathing get worse.  You have blood in your mucus.  You pass out (faint).  You feel lightheaded.  You have a bad headache.  You throw up (vomit) again and again. MAKE SURE YOU:  Understand these instructions.  Will watch your condition.  Will get help right away if you are not doing well or get worse. Document Released: 05/10/2008 Document Revised: 09/12/2013 Document Reviewed: 07/17/2013 [facility] Patient Information 2014 [place], [place].

## 2014-01-25 ENCOUNTER — Encounter: Payer: Self-pay | Admitting: Internal Medicine

## 2014-01-25 ENCOUNTER — Ambulatory Visit (INDEPENDENT_AMBULATORY_CARE_PROVIDER_SITE_OTHER): Payer: Medicare Other | Admitting: General Practice

## 2014-01-25 ENCOUNTER — Ambulatory Visit (INDEPENDENT_AMBULATORY_CARE_PROVIDER_SITE_OTHER): Payer: Medicare Other | Admitting: Internal Medicine

## 2014-01-25 VITALS — BP 128/82 | HR 71 | Temp 98.4°F | Wt >= 6400 oz

## 2014-01-25 DIAGNOSIS — J209 Acute bronchitis, unspecified: Secondary | ICD-10-CM

## 2014-01-25 DIAGNOSIS — I2699 Other pulmonary embolism without acute cor pulmonale: Secondary | ICD-10-CM

## 2014-01-25 DIAGNOSIS — R059 Cough, unspecified: Secondary | ICD-10-CM

## 2014-01-25 DIAGNOSIS — R05 Cough: Secondary | ICD-10-CM

## 2014-01-25 DIAGNOSIS — Z5181 Encounter for therapeutic drug level monitoring: Secondary | ICD-10-CM | POA: Insufficient documentation

## 2014-01-25 LAB — PULMONARY FUNCTION TEST
DL/VA % PRED: 141 %
DL/VA: 6.79 ml/min/mmHg/L
DLCO UNC % PRED: 89 %
DLCO UNC: 32.7 ml/min/mmHg
FEF 25-75 Post: 5.79 L/sec
FEF 25-75 Pre: 4.98 L/sec
FEF2575-%Change-Post: 16 %
FEF2575-%PRED-POST: 181 %
FEF2575-%PRED-PRE: 156 %
FEV1-%Change-Post: 4 %
FEV1-%PRED-POST: 78 %
FEV1-%Pred-Pre: 74 %
FEV1-PRE: 2.6 L
FEV1-Post: 2.73 L
FEV1FVC-%CHANGE-POST: -2 %
FEV1FVC-%Pred-Pre: 116 %
FEV6-%CHANGE-POST: 7 %
FEV6-%PRED-PRE: 66 %
FEV6-%Pred-Post: 71 %
FEV6-Post: 3.08 L
FEV6-Pre: 2.87 L
FEV6FVC-%CHANGE-POST: 0 %
FEV6FVC-%PRED-POST: 102 %
FEV6FVC-%Pred-Pre: 103 %
FVC-%CHANGE-POST: 7 %
FVC-%PRED-POST: 69 %
FVC-%Pred-Pre: 64 %
FVC-PRE: 2.87 L
FVC-Post: 3.09 L
PRE FEV1/FVC RATIO: 91 %
Post FEV1/FVC ratio: 88 %
Post FEV6/FVC ratio: 100 %
Pre FEV6/FVC Ratio: 100 %
RV % pred: 70 %
RV: 1.7 L
TLC % pred: 75 %
TLC: 5.73 L

## 2014-01-25 LAB — POCT INR: INR: 1.5

## 2014-01-25 MED ORDER — METHYLPREDNISOLONE ACETATE 80 MG/ML IJ SUSP
80.0000 mg | Freq: Once | INTRAMUSCULAR | Status: AC
  Administered 2014-01-25: 80 mg via INTRAMUSCULAR

## 2014-01-25 MED ORDER — BUDESONIDE-FORMOTEROL FUMARATE 80-4.5 MCG/ACT IN AERO: 2.0000 | INHALATION_SPRAY | Freq: Two times a day (BID) | RESPIRATORY_TRACT | Status: DC

## 2014-01-25 MED ORDER — PREDNISONE (PAK) 10 MG PO TABS: ORAL_TABLET | ORAL | Status: DC

## 2014-01-25 NOTE — Assessment & Plan Note (Signed)
Wt Readings from Last 3 Encounters:  01/25/14 418 lb 3.2 oz (189.694 kg)  01/09/14 407 lb 3.2 oz (184.705 kg)  01/04/14 417 lb 6 oz (189.32 kg)   Planning to undergo evaluation for bariatric bypass The patient is asked to make an attempt to improve diet and exercise patterns to aid in medical management of this problem.

## 2014-01-25 NOTE — Patient Instructions (Addendum)
It was good to see you today.  We have reviewed your prior records including labs and tests today  Medrol shot given to you today to help with cough and "asthma" symptoms  Medications reviewed and updated Start Symbicort 2x/day everyday (sample given today) and continue to use Albuterol "rescue" as needed for tightness/cough .  If you develop worsening symptoms or fever, call and we can reconsider antibiotics, but it does not appear necessary to use antibiotics at this time.  we'll make referral for pulmonary function test to look for asthma or other problem. Our office will contact you regarding appointment(s) once made.  Please schedule followup in 2-4 weeks with Dr Jenny Reichmann to review symptoms, call sooner if problems.  Asthma, Adult Asthma is a recurring condition in which the airways tighten and narrow. Asthma can make it difficult to breathe. It can cause coughing, wheezing, and shortness of breath. Asthma episodes (also called asthma attacks) range from minor to life-threatening. Asthma cannot be cured, but medicines and lifestyle changes can help control it. CAUSES Asthma is believed to be caused by inherited (genetic) and environmental factors, but its exact cause is unknown. Asthma may be triggered by allergens, lung infections, or irritants in the air. Asthma triggers are different for each person. Common triggers include:   Animal dander.  Dust mites.  Cockroaches.  Pollen from trees or grass.  Mold.  Smoke.  Air pollutants such as dust, household cleaners, hair sprays, aerosol sprays, paint fumes, strong chemicals, or strong odors.  Cold air, weather changes, and winds (which increase molds and pollens in the air).  Strong emotional expressions such as crying or laughing hard.  Stress.  Certain medicines (such as aspirin) or types of drugs (such as beta-blockers).  Sulfites in foods and drinks. Foods and drinks that may contain sulfites include dried fruit, potato  chips, and sparkling grape juice.  Infections or inflammatory conditions such as the flu, a cold, or an inflammation of the nasal membranes (rhinitis).  Gastroesophageal reflux disease (GERD).  Exercise or strenuous activity. SYMPTOMS Symptoms may occur immediately after asthma is triggered or many hours later. Symptoms include:  Wheezing.  Excessive nighttime or early morning coughing.  Frequent or severe coughing with a common cold.  Chest tightness.  Shortness of breath. DIAGNOSIS  The diagnosis of asthma is made by a review of your medical history and a physical exam. Tests may also be performed. These may include:  Lung function studies. These tests show how much air you breath in and out.  Allergy tests.  Imaging tests such as X-rays. TREATMENT  Asthma cannot be cured, but it can usually be controlled. Treatment involves identifying and avoiding your asthma triggers. It also involves medicines. There are 2 classes of medicine used for asthma treatment:   Controller medicines. These prevent asthma symptoms from occurring. They are usually taken every day.  Reliever or rescue medicines. These quickly relieve asthma symptoms. They are used as needed and provide short-term relief. Your health care provider will help you create an asthma action plan. An asthma action plan is a written plan for managing and treating your asthma attacks. It includes a list of your asthma triggers and how they may be avoided. It also includes information on when medicines should be taken and when their dosage should be changed. An action plan may also involve the use of a device called a peak flow meter. A peak flow meter measures how well the lungs are working. It helps you monitor your  condition. HOME CARE INSTRUCTIONS   Take medicine as directed by your health care provider. Speak with your health care provider if you have questions about how or when to take the medicines.  Use a peak flow  meter as directed by your health care provider. Record and keep track of readings.  Understand and use the action plan to help minimize or stop an asthma attack without needing to seek medical care.  Control your home environment in the following ways to help prevent asthma attacks:  Do not smoke. Avoid being exposed to secondhand smoke.  Change your heating and air conditioning filter regularly.  Limit your use of fireplaces and wood stoves.  Get rid of pests (such as roaches and mice) and their droppings.  Throw away plants if you see mold on them.  Clean your floors and dust regularly. Use unscented cleaning products.  Try to have someone else vacuum for you regularly. Stay out of rooms while they are being vacuumed and for a short while afterward. If you vacuum, use a dust mask from a hardware store, a double-layered or microfilter vacuum cleaner bag, or a vacuum cleaner with a HEPA filter.  Replace carpet with wood, tile, or vinyl flooring. Carpet can trap dander and dust.  Use allergy-proof pillows, mattress covers, and box spring covers.  Wash bed sheets and blankets every week in hot water and dry them in a dryer.  Use blankets that are made of polyester or cotton.  Clean bathrooms and kitchens with bleach. If possible, have someone repaint the walls in these rooms with mold-resistant paint. Keep out of the rooms that are being cleaned and painted.  Wash hands frequently. SEEK MEDICAL CARE IF:   You have wheezing, shortness of breath, or a cough even if taking medicine to prevent attacks.  The colored mucus you cough up (sputum) is thicker than usual.  Your sputum changes from clear or white to yellow, green, gray, or bloody.  You have any problems that may be related to the medicines you are taking (such as a rash, itching, swelling, or trouble breathing).  You are using a reliever medicine more than 2 3 times per week.  Your peak flow is still at 50 79% of you  personal best after following your action plan for 1 hour. SEEK IMMEDIATE MEDICAL CARE IF:   You seem to be getting worse and are unresponsive to treatment during an asthma attack.  You are short of breath even at rest.  You get short of breath when doing very little physical activity.  You have difficulty eating, drinking, or talking due to asthma symptoms.  You develop chest pain.  You develop a fast heartbeat.  You have a bluish color to your lips or fingernails.  You are lightheaded, dizzy, or faint.  Your peak flow is less than 50% of your personal best.  You have a fever or persistent symptoms for more than 2 3 days.  You have a fever and symptoms suddenly get worse. MAKE SURE YOU:   Understand these instructions.  Will watch your condition.  Will get help right away if you are not doing well or get worse. Document Released: 11/22/2005 Document Revised: 07/25/2013 Document Reviewed: 06/21/2013 [facility] Patient Information 2014 [place], [place].

## 2014-01-25 NOTE — Progress Notes (Signed)
Subjective:    Patient ID: [name], male    DOB: 06-13-1956, 58 y.o.   MRN: [id number]  HPI  Patient here for cough - recurrent hx same - Reviewed chronic medical issues and interval medical events Brief but short lasting improvement with antibiotics and pred Using Alb MDI prn with mod relief  Past Medical History  Diagnosis Date  . Diabetes mellitus     type II  . Hyperlipidemia   . Hypertension   . DVT, lower extremity     right  . Embolus 2005    pulmonary  . Depression   . Obesity   . BACK PAIN   . COLONIC POLYPS, HX OF   . DEGENERATIVE JOINT DISEASE, KNEES, BILATERAL   . DEPRESSION   . DIVERTICULOSIS, COLON   . HYPOGONADISM, MALE   . HYPOTENSION, ORTHOSTATIC   . OBESITY, MORBID   . Long term current use of anticoagulant   . Obstructive sleep apnea     uses cpap setting 8 to 10  . Kidney stones   . Renal insufficiency   . RENAL INSUFFICIENCY   . Shortness of breath   . No blood products     Review of Systems     Objective:   Physical Exam  BP 128/82  Pulse 71  Temp(Src) 98.4 F (36.9 C) (Oral)  Wt 418 lb 3.2 oz (189.694 kg)  SpO2 94% Wt Readings from Last 3 Encounters:  01/25/14 418 lb 3.2 oz (189.694 kg)  01/09/14 407 lb 3.2 oz (184.705 kg)  01/04/14 417 lb 6 oz (189.32 kg)    Constitutional: he is MO, but appears well-developed and well-nourished. No distress.  HENT: NCAT - sinus nontender to palp, Ears clear without effusion/erythema - OP with mild erythema but no PND Neck: thick. Normal range of motion. Neck supple. No JVD present. No thyromegaly present.  Cardiovascular: Normal rate, regular rhythm and normal heart sounds.  No murmur heard. No BLE edema. Pulmonary/Chest: Effort normal and breath sounds diminished both bases. No respiratory distress. he has soft end exp wheezes.  Psychiatric: he has a normal mood and affect. His behavior is normal. Judgment and thought content normal.   Lab Results  Component Value Date   WBC 15.8*  09/13/2013   HGB 12.9* 09/13/2013   HCT 39.1 09/13/2013   PLT 169 09/13/2013   GLUCOSE 117* 09/13/2013   CHOL 112 01/01/2013   TRIG 114.0 01/01/2013   HDL 35.40* 01/01/2013   LDLDIRECT 121.2 05/25/2011   LDLCALC 54 01/01/2013   ALT 26 09/11/2013   AST 23 09/11/2013   NA 136 09/13/2013   K 3.8 09/13/2013   CL 97 09/13/2013   CREATININE 1.17 09/13/2013   BUN 21 09/13/2013   CO2 31 09/13/2013   TSH 2.94 06/14/2012   PSA 0.88 06/14/2012   INR 1.5 01/25/2014   HGBA1C 10.1* 11/23/2013   MICROALBUR 1.4 06/14/2012    Dg Chest 2 View  01/03/2014   CLINICAL DATA:  Sick for 2 days, cough, shortness of breath  EXAM: CHEST  2 VIEW  COMPARISON:  CT ANGIO CHEST W/CM &/OR WO/CM dated 09/11/2013; DG CHEST 2 VIEW dated 09/11/2013; DG CHEST 2 VIEW dated 01/04/2012  FINDINGS: The examination is degraded due to patient body habitus.  Grossly unchanged borderline enlarged cardiac silhouette and mediastinal contours. Minimal worsening of mild diffuse slightly nodular thickening of the pulmonary interstitium. No discrete focal airspace opacities. No pleural effusion or pneumothorax. No definite evidence of edema. Grossly unchanged bones.  Punctate radiopaque structures within the upper abdomen may represent ingested debris.  IMPRESSION: Decreased lung volumes with findings suggestive of airways disease/bronchitis. No discrete focal airspace opacities to suggest pneumonia.   Electronically Signed   By: Sandi Mariscal M.D.   On: 01/03/2014 17:55       Assessment & Plan:   Persisting recurring cough, occ productive Temporary improvement following course of steroids and antibiotics x2 Reports temporary improvement with albuterol rescue inhaler prn  Last chest x-ray reviewed 01/04/14  ?Bronchospasm contributing to cough Remote secondhand smoke exposure reviewed Suspect viral URI trigger, now resolved No role for antibiotics without bacterial infection explained and reviewed in depth  Add LABA and steroid (Symbicort) twice daily to  ongoing rescue albuterol MDI Refer for PFTs tx acute bronchospasm with IM Medrol 80 mg today and prednisone taper over 6 days - ex done  follow up PCP in 2-4 weeks to continue and titration/med adjustments as needed  Problem List Items Addressed This Visit   OBESITY, MORBID      Wt Readings from Last 3 Encounters:  01/25/14 418 lb 3.2 oz (189.694 kg)  01/09/14 407 lb 3.2 oz (184.705 kg)  01/04/14 417 lb 6 oz (189.32 kg)   Planning to undergo evaluation for bariatric bypass The patient is asked to make an attempt to improve diet and exercise patterns to aid in medical management of this problem.     Pulmonary embolism      Hx same 2005 - chronic anticoag reviewed Lab Results  Component Value Date   INR 1.5 01/25/2014   INR 2.0 12/25/2013   INR 1.9 11/23/2013   PROTIME 18.2 05/16/2009   PROTIME 14.7 05/07/2009   Coumadin clinic management reviewed as ongoing     Other Visit Diagnoses   Cough    -  Primary    Relevant Orders       Pulmonary function test    Bronchospasm with bronchitis, acute        Relevant Orders       Pulmonary function test

## 2014-01-25 NOTE — Progress Notes (Signed)
Pre visit review using our clinic review tool, if applicable. No additional management support is needed unless otherwise documented below in the visit note. 

## 2014-01-25 NOTE — Assessment & Plan Note (Signed)
Hx same 2005 - chronic anticoag reviewed Lab Results  Component Value Date   INR 1.5 01/25/2014   INR 2.0 12/25/2013   INR 1.9 11/23/2013   PROTIME 18.2 05/16/2009   PROTIME 14.7 05/07/2009   Coumadin clinic management reviewed as ongoing

## 2014-01-25 NOTE — Progress Notes (Signed)
Pre-visit discussion using our clinic review tool. No additional management support is needed unless otherwise documented below in the visit note.  

## 2014-01-30 ENCOUNTER — Ambulatory Visit (INDEPENDENT_AMBULATORY_CARE_PROVIDER_SITE_OTHER): Payer: No Typology Code available for payment source | Admitting: Licensed Clinical Social Worker

## 2014-01-30 DIAGNOSIS — F321 Major depressive disorder, single episode, moderate: Secondary | ICD-10-CM

## 2014-02-04 ENCOUNTER — Other Ambulatory Visit: Payer: Self-pay | Admitting: Internal Medicine

## 2014-02-05 ENCOUNTER — Encounter: Payer: Self-pay | Admitting: Endocrinology

## 2014-02-12 ENCOUNTER — Encounter: Payer: Self-pay | Admitting: Endocrinology

## 2014-02-15 ENCOUNTER — Ambulatory Visit (INDEPENDENT_AMBULATORY_CARE_PROVIDER_SITE_OTHER): Payer: Medicare Other | Admitting: General Practice

## 2014-02-15 ENCOUNTER — Ambulatory Visit (INDEPENDENT_AMBULATORY_CARE_PROVIDER_SITE_OTHER): Payer: No Typology Code available for payment source | Admitting: Licensed Clinical Social Worker

## 2014-02-15 DIAGNOSIS — Z5181 Encounter for therapeutic drug level monitoring: Secondary | ICD-10-CM

## 2014-02-15 DIAGNOSIS — I2699 Other pulmonary embolism without acute cor pulmonale: Secondary | ICD-10-CM

## 2014-02-15 DIAGNOSIS — F321 Major depressive disorder, single episode, moderate: Secondary | ICD-10-CM

## 2014-02-15 LAB — POCT INR: INR: 1.5

## 2014-02-15 NOTE — Progress Notes (Signed)
Pre visit review using our clinic review tool, if applicable. No additional management support is needed unless otherwise documented below in the visit note. 

## 2014-02-22 ENCOUNTER — Encounter: Payer: Self-pay | Admitting: Internal Medicine

## 2014-02-22 ENCOUNTER — Ambulatory Visit (INDEPENDENT_AMBULATORY_CARE_PROVIDER_SITE_OTHER): Payer: Medicare Other | Admitting: Internal Medicine

## 2014-02-22 VITALS — BP 122/80 | HR 66 | Temp 97.5°F | Wt >= 6400 oz

## 2014-02-22 DIAGNOSIS — E1029 Type 1 diabetes mellitus with other diabetic kidney complication: Secondary | ICD-10-CM

## 2014-02-22 DIAGNOSIS — I10 Essential (primary) hypertension: Secondary | ICD-10-CM

## 2014-02-22 DIAGNOSIS — F329 Major depressive disorder, single episode, unspecified: Secondary | ICD-10-CM

## 2014-02-22 DIAGNOSIS — Z5181 Encounter for therapeutic drug level monitoring: Secondary | ICD-10-CM

## 2014-02-22 DIAGNOSIS — F3289 Other specified depressive episodes: Secondary | ICD-10-CM

## 2014-02-22 DIAGNOSIS — Z0000 Encounter for general adult medical examination without abnormal findings: Secondary | ICD-10-CM

## 2014-02-22 NOTE — Progress Notes (Signed)
Pre visit review using our clinic review tool, if applicable. No additional management support is needed unless otherwise documented below in the visit note. 

## 2014-02-22 NOTE — Assessment & Plan Note (Signed)
stable overall by history and exam, recent data reviewed with pt, and pt to continue medical treatment as before,  to f/u any worsening symptoms or concerns BP Readings from Last 3 Encounters:  02/22/14 122/80  01/25/14 128/82  01/09/14 122/68

## 2014-02-22 NOTE — Progress Notes (Signed)
Subjective:    Patient ID: [name], male    DOB: 09/05/1956, 58 y.o.   MRN: [id number]  HPI  Here to f/u, overall much improved on 10 mg lexapro, Denies worsening depressive symptoms, suicidal ideation, or panic; has ongoing anxiety, improved as well, without specific side effect such as lethargy or sluggishness.  States overall about 7/10 as far as improvement.  Much more energy and motivation regarding his care overall.  Overall good compliance with treatment, and good medicine tolerability. Unfort now out of his levemir, last dose last night, and was > $500 when tried to get refill at pharmacy.  Has f/u appt with Dr Loanne Drilling soon.  Also seeing counseling, with benefit as well.  Most recent INR at 1.5 (low) but states this was due to more greens in diet as he is working on eating healthier, coumadin is being adjusted per coumadin clinic. No new complaints Past Medical History  Diagnosis Date  . Diabetes mellitus     type II  . Hyperlipidemia   . Hypertension   . DVT, lower extremity     right  . Embolus 2005    pulmonary  . Depression   . Obesity   . BACK PAIN   . COLONIC POLYPS, HX OF   . DEGENERATIVE JOINT DISEASE, KNEES, BILATERAL   . DEPRESSION   . DIVERTICULOSIS, COLON   . HYPOGONADISM, MALE   . HYPOTENSION, ORTHOSTATIC   . OBESITY, MORBID   . Long term current use of anticoagulant   . Obstructive sleep apnea     uses cpap setting 8 to 10  . Kidney stones   . Renal insufficiency   . RENAL INSUFFICIENCY    Past Surgical History  Procedure Laterality Date  . Knee surgury  2000 & 2003     cartilage damage  . Ankle surgury  2001 bilateral    with bone spurs and torn tendon  . Edg  2008    chronic Duodenitis  . Tonsillectomy and adenoidectomy  age 67 or 68  . Total knee arthroplasty  01/10/2012    Procedure: TOTAL KNEE ARTHROPLASTY;  Surgeon: [name], MD;  Location: [facility];  Service: Orthopedics;  Laterality: Left;  . Lumbar laminectomy/decompression  microdiscectomy  09/28/2012    Procedure: LUMBAR LAMINECTOMY/DECOMPRESSION MICRODISCECTOMY 2 LEVELS;  Surgeon: [name], MD;  Location: [facility];  Service: Orthopedics;  Laterality: Left;  Decompressive laminectomy L4-L5. Microdiscectomy L5-S1  . Heel spur surgery Bilateral 2005  . Replacement total knee  2013    reports that he quit smoking about 35 years ago. He has never used smokeless tobacco. He reports that he drinks alcohol. He reports that he does not use illicit drugs. family history includes Diabetes in his brother and sister; Hypertension in his mother; Prostate cancer in his father. Allergies  Allergen Reactions  . Other     NO BLOOD -JEHOVAH'S WITNESS-SIGNED REFUSAL BUT WOULD TAKE ALBUMIN   Current Outpatient Prescriptions on File Prior to Visit  Medication Sig Dispense Refill  . ACCU-CHEK SOFTCLIX LANCETS lancets Use as directed four times daily to check blood sugar.  Diagnosis code 250.00  300 each  3  . albuterol (PROAIR HFA) 108 (90 BASE) MCG/ACT inhaler Inhale 2 puffs into the lungs every 4 (four) hours as needed. For shortness of breath  1 Inhaler  11  . Alcohol Swabs (B-D SINGLE USE SWABS REGULAR) PADS Use as directed  200 each  3  . aspirin (ECOTRIN LOW STRENGTH) 81  MG EC tablet Take 81 mg by mouth daily before breakfast.       . atorvastatin (LIPITOR) 80 MG tablet Take 80 mg by mouth daily.      . Blood Glucose Monitoring Suppl (ACCU-CHEK AVIVA PLUS) W/DEVICE KIT 1 each by Other route daily. Use as directed to check blood sugar.  1 kit  0  . budesonide-formoterol (SYMBICORT) 80-4.5 MCG/ACT inhaler Inhale 2 puffs into the lungs 2 (two) times daily.  1 Inhaler  3  . escitalopram (LEXAPRO) 10 MG tablet Take 1 tablet (10 mg total) by mouth daily.  90 tablet  3  . glucose blood (ACCU-CHEK AVIVA PLUS) test strip Use as directed four times daily to check blood sugar.  Diagnosis code 250.00  300 each  3  . guaiFENesin-codeine (ROBITUSSIN AC) 100-10 MG/5ML syrup Take 10 mLs  by mouth 4 (four) times daily as needed for cough.  180 mL  0  . hydrochlorothiazide (HYDRODIURIL) 25 MG tablet Take 1 tablet (25 mg total) by mouth every morning.  90 tablet  3  . HYDROcodone-acetaminophen (NORCO/VICODIN) 5-325 MG per tablet Take 1-2 tablets by mouth every 6 (six) hours as needed. For pain      . Insulin Detemir 100 UNIT/ML SOPN Inject 160 Units into the skin every morning.  20 pen  11  . Insulin Pen Needle (BD PEN NEEDLE NANO U/F) 32G X 4 MM MISC USE AS DIRECTED 4 TIMES A DAY  100 each  5  . lisinopril (PRINIVIL,ZESTRIL) 5 MG tablet Take 5 mg by mouth daily.      . meloxicam (MOBIC) 7.5 MG tablet Take 2 tablets (15 mg total) by mouth daily.  30 tablet  0  . phentermine (ADIPEX-P) 37.5 MG tablet Take 37.5 mg by mouth daily before breakfast.      . predniSONE (STERAPRED UNI-PAK) 10 MG tablet Take by mouth as directed. As directed x 6 days  21 tablet  0  . pregabalin (LYRICA) 50 MG capsule Take 50 mg by mouth 2 (two) times daily. Unsure on dose      . warfarin (COUMADIN) 5 MG tablet Take as directed by anticoagulation clinic  180 tablet  0   No current facility-administered medications on file prior to visit.   Review of Systems  Constitutional: Negative for unexpected weight change, or unusual diaphoresis  HENT: Negative for tinnitus.   Eyes: Negative for photophobia and visual disturbance.  Respiratory: Negative for choking and stridor.   Gastrointestinal: Negative for vomiting and blood in stool.  Genitourinary: Negative for hematuria and decreased urine volume.  Musculoskeletal: Negative for acute joint swelling Skin: Negative for color change and wound.  Neurological: Negative for tremors and numbness other than noted  Psychiatric/Behavioral: Negative for decreased concentration or  hyperactivity.       Objective:   Physical Exam BP 122/80  Pulse 66  Temp(Src) 97.5 F (36.4 C) (Oral)  Wt 407 lb (184.614 kg)  SpO2 95% VS noted,  Constitutional: Pt appears  well-developed and well-nourished. morbid obese HENT: Head: NCAT.  Right Ear: External ear normal.  Left Ear: External ear normal.  Eyes: Conjunctivae and EOM are normal. Pupils are equal, round, and reactive to light.  Neck: Normal range of motion. Neck supple.  Cardiovascular: Normal rate and regular rhythm.   Pulmonary/Chest: Effort normal and breath sounds normal.  Abd:  Soft, NT, non-distended, + BS Neurological: Pt is alert. Not confused  Skin: Skin is warm. No erythema.  Psychiatric: Pt behavior is normal. Thought  content normal. Much less depressed affect and psychomotor retardation, not overly nervous appearing    Assessment & Plan:

## 2014-02-22 NOTE — Patient Instructions (Addendum)
You are given the sample of levemir vial today  Please continue all other medications as before, and refills have been done if requested. Please have the pharmacy call with any other refills you may need.  Please continue your efforts at being more active, low cholesterol diet, and weight control.  Please keep your appointments with your specialists as you have planned - endo and counseling  No further lab work needed today  Please return in 3 months, or sooner if needed, with Lab testing done 3-5 days before

## 2014-02-22 NOTE — Assessment & Plan Note (Signed)
Gave sample levemir - 1 vial 100 u/cc - 10 cc vial as this is all we had, to f/u with endo as planned

## 2014-02-22 NOTE — Assessment & Plan Note (Signed)
Much improved overall, to cont same dosing; warned of poop out effect over the next few months - to call if needs higher dosing, watch for sluggishness , f/u counseling as he does

## 2014-02-22 NOTE — Assessment & Plan Note (Signed)
Pt denies chest pain, increased sob or doe, wheezing, orthopnea, PND, increased LE swelling, palpitations, dizziness or syncope. - to f/u coumadinc clinic as planned

## 2014-02-25 ENCOUNTER — Telehealth: Payer: Self-pay | Admitting: Internal Medicine

## 2014-02-25 ENCOUNTER — Ambulatory Visit (INDEPENDENT_AMBULATORY_CARE_PROVIDER_SITE_OTHER): Payer: Medicare Other | Admitting: Internal Medicine

## 2014-02-25 DIAGNOSIS — R059 Cough, unspecified: Secondary | ICD-10-CM

## 2014-02-25 DIAGNOSIS — J209 Acute bronchitis, unspecified: Secondary | ICD-10-CM

## 2014-02-25 DIAGNOSIS — R05 Cough: Secondary | ICD-10-CM

## 2014-02-25 NOTE — Telephone Encounter (Signed)
Relevant patient education assigned to patient using Emmi. ° °

## 2014-02-25 NOTE — Progress Notes (Signed)
PFT done today. 

## 2014-02-26 ENCOUNTER — Ambulatory Visit (INDEPENDENT_AMBULATORY_CARE_PROVIDER_SITE_OTHER): Payer: Medicare Other | Admitting: Endocrinology

## 2014-02-26 ENCOUNTER — Encounter: Payer: Self-pay | Admitting: Endocrinology

## 2014-02-26 VITALS — BP 118/76 | HR 70 | Temp 97.9°F | Ht 74.0 in | Wt >= 6400 oz

## 2014-02-26 DIAGNOSIS — E1029 Type 1 diabetes mellitus with other diabetic kidney complication: Secondary | ICD-10-CM

## 2014-02-26 MED ORDER — INSULIN NPH (HUMAN) (ISOPHANE) 100 UNIT/ML ~~LOC~~ SUSP: 140.0000 [IU] | SUBCUTANEOUS | Status: DC

## 2014-02-26 NOTE — Progress Notes (Signed)
Subjective:    Patient ID: [name], male    DOB: 11/02/1956, 58 y.o.   MRN: [id number]  HPI Pt returns for f/u of insulin-requiring DM (dx'ed 1996, when he presented with polyuria; he has been on insulin since 2004; he has mild if any neuropathy of the lower extremities, but he has associated renal insufficiency and autonomic neuropathy; he has never had severe hypoglycemia or DKA; he is still working towards bariatric surgery at [facility]; in late 2014, he was changed to QD insulin, after he achieved poor control on multiple daily injections).  no cbg record, but states cbg's vary from 60-180.  It is in general higher as the day goes on.  He says he can no longer afford the levemir.    Past Medical History  Diagnosis Date  . Diabetes mellitus     type II  . Hyperlipidemia   . Hypertension   . DVT, lower extremity     right  . Embolus 2005    pulmonary  . Depression   . Obesity   . BACK PAIN   . COLONIC POLYPS, HX OF   . DEGENERATIVE JOINT DISEASE, KNEES, BILATERAL   . DEPRESSION   . DIVERTICULOSIS, COLON   . HYPOGONADISM, MALE   . HYPOTENSION, ORTHOSTATIC   . OBESITY, MORBID   . Long term current use of anticoagulant   . Obstructive sleep apnea     uses cpap setting 8 to 10  . Kidney stones   . Renal insufficiency   . RENAL INSUFFICIENCY     Past Surgical History  Procedure Laterality Date  . Knee surgury  2000 & 2003     cartilage damage  . Ankle surgury  2001 bilateral    with bone spurs and torn tendon  . Edg  2008    chronic Duodenitis  . Tonsillectomy and adenoidectomy  age 30 or 41  . Total knee arthroplasty  01/10/2012    Procedure: TOTAL KNEE ARTHROPLASTY;  Surgeon: [name], MD;  Location: [facility];  Service: Orthopedics;  Laterality: Left;  . Lumbar laminectomy/decompression microdiscectomy  09/28/2012    Procedure: LUMBAR LAMINECTOMY/DECOMPRESSION MICRODISCECTOMY 2 LEVELS;  Surgeon: [name], MD;  Location: [facility];  Service:  Orthopedics;  Laterality: Left;  Decompressive laminectomy L4-L5. Microdiscectomy L5-S1  . Heel spur surgery Bilateral 2005  . Replacement total knee  2013    History   Social History  . Marital Status: Single    Spouse Name: N/A    Number of Children: 2  . Years of Education: N/A   Occupational History  . back at work at [employer] since Jan. 2010    Social History Main Topics  . Smoking status: Former Smoker -- 0.25 packs/day for 5 years    Quit date: 12/06/1978  . Smokeless tobacco: Never Used  . Alcohol Use: Yes     Comment: occasional  . Drug Use: No  . Sexual Activity: Not Currently   Other Topics Concern  . Not on file   Social History Narrative   Daily caffeine use one per day    Current Outpatient Prescriptions on File Prior to Visit  Medication Sig Dispense Refill  . ACCU-CHEK SOFTCLIX LANCETS lancets Use as directed four times daily to check blood sugar.  Diagnosis code 250.00  300 each  3  . albuterol (PROAIR HFA) 108 (90 BASE) MCG/ACT inhaler Inhale 2 puffs into the lungs every 4 (four) hours as needed. For shortness of  breath  1 Inhaler  11  . Alcohol Swabs (B-D SINGLE USE SWABS REGULAR) PADS Use as directed  200 each  3  . aspirin (ECOTRIN LOW STRENGTH) 81 MG EC tablet Take 81 mg by mouth daily before breakfast.       . atorvastatin (LIPITOR) 80 MG tablet Take 80 mg by mouth daily.      . Blood Glucose Monitoring Suppl (ACCU-CHEK AVIVA PLUS) W/DEVICE KIT 1 each by Other route daily. Use as directed to check blood sugar.  1 kit  0  . budesonide-formoterol (SYMBICORT) 80-4.5 MCG/ACT inhaler Inhale 2 puffs into the lungs 2 (two) times daily.  1 Inhaler  3  . escitalopram (LEXAPRO) 10 MG tablet Take 1 tablet (10 mg total) by mouth daily.  90 tablet  3  . glucose blood (ACCU-CHEK AVIVA PLUS) test strip Use as directed four times daily to check blood sugar.  Diagnosis code 250.00  300 each  3  . guaiFENesin-codeine (ROBITUSSIN AC) 100-10 MG/5ML syrup  Take 10 mLs by mouth 4 (four) times daily as needed for cough.  180 mL  0  . hydrochlorothiazide (HYDRODIURIL) 25 MG tablet Take 1 tablet (25 mg total) by mouth every morning.  90 tablet  3  . HYDROcodone-acetaminophen (NORCO/VICODIN) 5-325 MG per tablet Take 1-2 tablets by mouth every 6 (six) hours as needed. For pain      . lisinopril (PRINIVIL,ZESTRIL) 5 MG tablet Take 5 mg by mouth daily.      . meloxicam (MOBIC) 7.5 MG tablet Take 2 tablets (15 mg total) by mouth daily.  30 tablet  0  . phentermine (ADIPEX-P) 37.5 MG tablet Take 37.5 mg by mouth daily before breakfast.      . pregabalin (LYRICA) 50 MG capsule Take 50 mg by mouth 2 (two) times daily. Unsure on dose      . warfarin (COUMADIN) 5 MG tablet Take as directed by anticoagulation clinic  180 tablet  0   No current facility-administered medications on file prior to visit.    Allergies  Allergen Reactions  . Other     NO BLOOD -JEHOVAH'S WITNESS-SIGNED REFUSAL BUT WOULD TAKE ALBUMIN    Family History  Problem Relation Age of Onset  . Hypertension Mother   . Prostate cancer Father   . Diabetes Sister     died with DM 2001/02/18, 2 more sisters with diabetes  . Diabetes Brother     BP 118/76  Pulse 70  Temp(Src) 97.9 F (36.6 C) (Oral)  Ht '6\' 2"'  (1.88 m)  Wt 407 lb (184.614 kg)  BMI 52.23 kg/m2  SpO2 94%  Review of Systems Denies LOC.  He has lost a few lbs.    Objective:   Physical Exam VITAL SIGNS:  See vs page GENERAL: no distress  Lab Results  Component Value Date   HGBA1C 10.1* 11/23/2013      Assessment & Plan:  DM: This insulin regimen was chosen from multiple options, for its simplicity.  The benefits of glycemic control must be weighed against the risks of hypoglycemia.   Morbid obesity: this complicates the rx of DM.   Renal insufficiency: this increases the risk of hypoglycemia.

## 2014-02-26 NOTE — Patient Instructions (Addendum)
check your blood sugar twice a day.  vary the time of day when you check, between before the 3 meals, and at bedtime.  also check if you have symptoms of your blood sugar being too high or too low.  please keep a record of the readings and bring it to your next appointment here.  please call us sooner if your blood sugar goes below 70, or if you have a lot of readings over 200.   Please change the levemir to NPH, 140 units each morning.  This is a guess.  We will probably have to adjust this, so please call if you need to.   blood tests are being requested for you today.  We'll contact you with results.  Please come back for a follow-up appointment in 1 month.

## 2014-02-28 ENCOUNTER — Other Ambulatory Visit: Payer: Self-pay

## 2014-02-28 ENCOUNTER — Encounter: Payer: Self-pay | Admitting: Endocrinology

## 2014-02-28 ENCOUNTER — Telehealth: Payer: Self-pay

## 2014-02-28 MED ORDER — INSULIN NPH (HUMAN) (ISOPHANE) 100 UNIT/ML ~~LOC~~ SUSP: 140.0000 [IU] | SUBCUTANEOUS | Status: DC

## 2014-02-28 NOTE — Telephone Encounter (Signed)
Pt called requesting that insulin be changed to Levemir vial do to price. Please advise, Thanks!

## 2014-03-01 ENCOUNTER — Other Ambulatory Visit: Payer: Self-pay | Admitting: Endocrinology

## 2014-03-01 MED ORDER — INSULIN DETEMIR 100 UNIT/ML ~~LOC~~ SOLN: 190.0000 [IU] | Freq: Every day | SUBCUTANEOUS | Status: DC

## 2014-03-01 NOTE — Telephone Encounter (Signed)
Pt informed

## 2014-03-01 NOTE — Telephone Encounter (Signed)
Ok, i have sent a prescription to your pharmacy  

## 2014-03-06 ENCOUNTER — Ambulatory Visit (INDEPENDENT_AMBULATORY_CARE_PROVIDER_SITE_OTHER): Payer: No Typology Code available for payment source | Admitting: Licensed Clinical Social Worker

## 2014-03-06 DIAGNOSIS — F321 Major depressive disorder, single episode, moderate: Secondary | ICD-10-CM

## 2014-03-13 ENCOUNTER — Ambulatory Visit (INDEPENDENT_AMBULATORY_CARE_PROVIDER_SITE_OTHER): Payer: Medicare Other | Admitting: Internal Medicine

## 2014-03-13 ENCOUNTER — Encounter: Payer: Self-pay | Admitting: Internal Medicine

## 2014-03-13 VITALS — BP 122/82 | HR 75 | Temp 98.3°F | Ht 74.0 in | Wt >= 6400 oz

## 2014-03-13 DIAGNOSIS — N644 Mastodynia: Secondary | ICD-10-CM | POA: Insufficient documentation

## 2014-03-13 DIAGNOSIS — E1029 Type 1 diabetes mellitus with other diabetic kidney complication: Secondary | ICD-10-CM

## 2014-03-13 DIAGNOSIS — N63 Unspecified lump in unspecified breast: Secondary | ICD-10-CM

## 2014-03-13 DIAGNOSIS — Z23 Encounter for immunization: Secondary | ICD-10-CM

## 2014-03-13 MED ORDER — DOXYCYCLINE HYCLATE 100 MG PO TABS: 100.0000 mg | ORAL_TABLET | Freq: Two times a day (BID) | ORAL | Status: DC

## 2014-03-13 NOTE — Assessment & Plan Note (Signed)
For diag mammogram 

## 2014-03-13 NOTE — Progress Notes (Signed)
Subjective:    Patient ID: [name], male    DOB: 09-21-1956, 58 y.o.   MRN: [id number]  HPI  Here to f/u, c/o left nipple pain, slight swelling and irritation, no drainage or blood, but also noticed a small lump and possbily even general breast enlargment in last few wks. Asks fo Humulin N insulin as he is in donut hole and meds cost too much.  Pt denies chest pain, increased sob or doe, wheezing, orthopnea, PND, increased LE swelling, palpitations, dizziness or syncope. Pt denies new neurological symptoms such as new headache, or facial or extremity weakness or numbness   Pt denies polydipsia, polyuria, though most recent a1c was very high Past Medical History  Diagnosis Date  . Diabetes mellitus     type II  . Hyperlipidemia   . Hypertension   . DVT, lower extremity     right  . Embolus 2005    pulmonary  . Depression   . Obesity   . BACK PAIN   . COLONIC POLYPS, HX OF   . DEGENERATIVE JOINT DISEASE, KNEES, BILATERAL   . DEPRESSION   . DIVERTICULOSIS, COLON   . HYPOGONADISM, MALE   . HYPOTENSION, ORTHOSTATIC   . OBESITY, MORBID   . Long term current use of anticoagulant   . Obstructive sleep apnea     uses cpap setting 8 to 10  . Kidney stones   . Renal insufficiency   . RENAL INSUFFICIENCY    Past Surgical History  Procedure Laterality Date  . Knee surgury  2000 & 2003     cartilage damage  . Ankle surgury  2001 bilateral    with bone spurs and torn tendon  . Edg  2008    chronic Duodenitis  . Tonsillectomy and adenoidectomy  age 31 or 21  . Total knee arthroplasty  01/10/2012    Procedure: TOTAL KNEE ARTHROPLASTY;  Surgeon: [name], MD;  Location: [facility];  Service: Orthopedics;  Laterality: Left;  . Lumbar laminectomy/decompression microdiscectomy  09/28/2012    Procedure: LUMBAR LAMINECTOMY/DECOMPRESSION MICRODISCECTOMY 2 LEVELS;  Surgeon: [name], MD;  Location: [facility];  Service: Orthopedics;  Laterality: Left;  Decompressive laminectomy L4-L5.  Microdiscectomy L5-S1  . Heel spur surgery Bilateral 2005  . Replacement total knee  2013    reports that he quit smoking about 35 years ago. He has never used smokeless tobacco. He reports that he drinks alcohol. He reports that he does not use illicit drugs. family history includes Diabetes in his brother and sister; Hypertension in his mother; Prostate cancer in his father. Allergies  Allergen Reactions  . Other     NO BLOOD -JEHOVAH'S WITNESS-SIGNED REFUSAL BUT WOULD TAKE ALBUMIN   Current Outpatient Prescriptions on File Prior to Visit  Medication Sig Dispense Refill  . ACCU-CHEK SOFTCLIX LANCETS lancets Use as directed four times daily to check blood sugar.  Diagnosis code 250.00  300 each  3  . albuterol (PROAIR HFA) 108 (90 BASE) MCG/ACT inhaler Inhale 2 puffs into the lungs every 4 (four) hours as needed. For shortness of breath  1 Inhaler  11  . Alcohol Swabs (B-D SINGLE USE SWABS REGULAR) PADS Use as directed  200 each  3  . aspirin (ECOTRIN LOW STRENGTH) 81 MG EC tablet Take 81 mg by mouth daily before breakfast.       . atorvastatin (LIPITOR) 80 MG tablet Take 80 mg by mouth daily.      . Blood Glucose Monitoring  Suppl (ACCU-CHEK AVIVA PLUS) W/DEVICE KIT 1 each by Other route daily. Use as directed to check blood sugar.  1 kit  0  . budesonide-formoterol (SYMBICORT) 80-4.5 MCG/ACT inhaler Inhale 2 puffs into the lungs 2 (two) times daily.  1 Inhaler  3  . escitalopram (LEXAPRO) 10 MG tablet Take 1 tablet (10 mg total) by mouth daily.  90 tablet  3  . glucose blood (ACCU-CHEK AVIVA PLUS) test strip Use as directed four times daily to check blood sugar.  Diagnosis code 250.00  300 each  3  . guaiFENesin-codeine (ROBITUSSIN AC) 100-10 MG/5ML syrup Take 10 mLs by mouth 4 (four) times daily as needed for cough.  180 mL  0  . hydrochlorothiazide (HYDRODIURIL) 25 MG tablet Take 1 tablet (25 mg total) by mouth every morning.  90 tablet  3  . insulin detemir (LEVEMIR) 100 UNIT/ML  injection Inject 1.9 mLs (190 Units total) into the skin at bedtime.  70 mL  11  . lisinopril (PRINIVIL,ZESTRIL) 5 MG tablet Take 5 mg by mouth daily.      . meloxicam (MOBIC) 7.5 MG tablet Take 2 tablets (15 mg total) by mouth daily.  30 tablet  0  . phentermine (ADIPEX-P) 37.5 MG tablet Take 37.5 mg by mouth daily before breakfast.      . pregabalin (LYRICA) 50 MG capsule Take 50 mg by mouth 2 (two) times daily. Unsure on dose      . warfarin (COUMADIN) 5 MG tablet Take as directed by anticoagulation clinic  180 tablet  0   No current facility-administered medications on file prior to visit.   Review of Systems  Constitutional: Negative for unexpected weight change, or unusual diaphoresis  HENT: Negative for tinnitus.   Eyes: Negative for photophobia and visual disturbance.  Respiratory: Negative for choking and stridor.   Gastrointestinal: Negative for vomiting and blood in stool.  Genitourinary: Negative for hematuria and decreased urine volume.  Musculoskeletal: Negative for acute joint swelling Skin: Negative for color change and wound.  Neurological: Negative for tremors and numbness other than noted  Psychiatric/Behavioral: Negative for decreased concentration or  hyperactivity.       Objective:   Physical Exam BP 122/82  Pulse 75  Temp(Src) 98.3 F (36.8 C) (Oral)  Ht _0  (1.88 m)  Wt 409 lb 2 oz (185.578 kg)  BMI 52.51 kg/m2  SpO2 92% VS noted,  Constitutional: Pt appears well-developed and well-nourished.  HENT: Head: NCAT.  Right Ear: External ear normal.  Left Ear: External ear normal.  Eyes: Conjunctivae and EOM are normal. Pupils are equal, round, and reactive to light.  Neck: Normal range of motion. Neck supple.  Cardiovascular: Normal rate and regular rhythm.   Pulmonary/Chest: Effort normal and breath sounds normal.  Left breast with nipple slight enlargement, cracked, mild swelling, also with a NT < 1 cm subq nodule palpated just caudal to the areola,  right breast with ? Gynecomastia (difficult due to his size) Neurological: Pt is alert. Not confused  Skin: Skin is warm. No erythema.  Psychiatric: Pt behavior is normal. Thought content normal.      Assessment & Plan:

## 2014-03-13 NOTE — Progress Notes (Signed)
Pre visit review using our clinic review tool, if applicable. No additional management support is needed unless otherwise documented below in the visit note. 

## 2014-03-13 NOTE — Patient Instructions (Addendum)
You had the new Prevnar pneumonia shot today  Please take all new medication as prescribed - the antibiotic  Please use a barrier over the left nipple such as a bandaid, and wear cotton shirts to avoid further irritation  You will be contacted regarding the referral for: diagnostic mammogram for the lump  You should see Dr Loanne Drilling for sugar, as well as breast enlargement if symptoms persist, as you may need further evaluation for gynecomastia  You may want to mention Toujeo to Dr Loanne Drilling , b/c with a discount card, this might less expensive than your current treatment

## 2014-03-13 NOTE — Assessment & Plan Note (Signed)
?   Irritation vs infection - for doxy course,  to f/u any worsening symptoms or concerns

## 2014-03-13 NOTE — Assessment & Plan Note (Signed)
D/w pt possible toujeo use, might be less expensive than current med with the discount card, to ask at pharmacy, f/u with Dr Loanne Drilling Lab Results  Component Value Date   HGBA1C 10.1* 11/23/2013

## 2014-03-15 ENCOUNTER — Ambulatory Visit (INDEPENDENT_AMBULATORY_CARE_PROVIDER_SITE_OTHER): Payer: Medicare Other | Admitting: General Practice

## 2014-03-15 DIAGNOSIS — Z5181 Encounter for therapeutic drug level monitoring: Secondary | ICD-10-CM

## 2014-03-15 DIAGNOSIS — I2699 Other pulmonary embolism without acute cor pulmonale: Secondary | ICD-10-CM

## 2014-03-15 LAB — POCT INR: INR: 2.7

## 2014-03-15 NOTE — Progress Notes (Signed)
Pre visit review using our clinic review tool, if applicable. No additional management support is needed unless otherwise documented below in the visit note. 

## 2014-03-18 ENCOUNTER — Other Ambulatory Visit: Payer: Self-pay | Admitting: Internal Medicine

## 2014-03-18 DIAGNOSIS — N644 Mastodynia: Secondary | ICD-10-CM

## 2014-03-18 DIAGNOSIS — N63 Unspecified lump in unspecified breast: Secondary | ICD-10-CM

## 2014-03-20 ENCOUNTER — Ambulatory Visit (INDEPENDENT_AMBULATORY_CARE_PROVIDER_SITE_OTHER): Payer: Medicare Other | Admitting: General Practice

## 2014-03-20 DIAGNOSIS — Z5181 Encounter for therapeutic drug level monitoring: Secondary | ICD-10-CM

## 2014-03-20 DIAGNOSIS — I2699 Other pulmonary embolism without acute cor pulmonale: Secondary | ICD-10-CM

## 2014-03-20 LAB — POCT INR: INR: 1.8

## 2014-03-20 NOTE — Progress Notes (Signed)
Pre visit review using our clinic review tool, if applicable. No additional management support is needed unless otherwise documented below in the visit note. 

## 2014-03-21 ENCOUNTER — Other Ambulatory Visit: Payer: Self-pay | Admitting: General Practice

## 2014-03-21 ENCOUNTER — Other Ambulatory Visit: Payer: Self-pay | Admitting: Internal Medicine

## 2014-03-21 MED ORDER — WARFARIN SODIUM 5 MG PO TABS: ORAL_TABLET | ORAL | Status: DC

## 2014-03-26 ENCOUNTER — Ambulatory Visit
Admission: RE | Admit: 2014-03-26 | Discharge: 2014-03-26 | Disposition: A | Discharge status: Home / Self Care | Payer: Medicare Other | Source: Ambulatory Visit | Attending: Internal Medicine | Admitting: Internal Medicine

## 2014-03-26 ENCOUNTER — Encounter: Payer: Self-pay | Admitting: Endocrinology

## 2014-03-26 ENCOUNTER — Ambulatory Visit (INDEPENDENT_AMBULATORY_CARE_PROVIDER_SITE_OTHER): Payer: Medicare Other | Admitting: Endocrinology

## 2014-03-26 ENCOUNTER — Telehealth: Payer: Self-pay

## 2014-03-26 VITALS — BP 120/86 | HR 73 | Temp 97.8°F | Ht 74.0 in | Wt >= 6400 oz

## 2014-03-26 DIAGNOSIS — N644 Mastodynia: Secondary | ICD-10-CM

## 2014-03-26 DIAGNOSIS — N63 Unspecified lump in unspecified breast: Secondary | ICD-10-CM

## 2014-03-26 DIAGNOSIS — E119 Type 2 diabetes mellitus without complications: Secondary | ICD-10-CM

## 2014-03-26 NOTE — Progress Notes (Signed)
Subjective:    Patient ID: [name], male    DOB: 1956-09-09, 58 y.o.   MRN: [id number]  HPI Pt returns for f/u of insulin-requiring DM (dx'ed 1996, when he presented with polyuria; he has been on insulin since 2004; he has mild if any neuropathy of the lower extremities, but he has associated renal insufficiency and autonomic neuropathy; he has never had severe hypoglycemia or DKA; he is still working towards bariatric surgery at [facility]; in late 2014, he was changed to QD insulin, after he achieved poor control on multiple daily injections; in 2014, he changed to human NPH, due to cost).   no cbg record, but states cbg's are well-controlled.  He has only 1 episode of hypoglycemia, and this was mild.  This happened after exertion.   Past Medical History  Diagnosis Date  . Diabetes mellitus     type II  . Hyperlipidemia   . Hypertension   . DVT, lower extremity     right  . Embolus 2005    pulmonary  . Depression   . Obesity   . BACK PAIN   . COLONIC POLYPS, HX OF   . DEGENERATIVE JOINT DISEASE, KNEES, BILATERAL   . DEPRESSION   . DIVERTICULOSIS, COLON   . HYPOGONADISM, MALE   . HYPOTENSION, ORTHOSTATIC   . OBESITY, MORBID   . Long term current use of anticoagulant   . Obstructive sleep apnea     uses cpap setting 8 to 10  . Kidney stones   . Renal insufficiency   . RENAL INSUFFICIENCY     Past Surgical History  Procedure Laterality Date  . Knee surgury  2000 & 2003     cartilage damage  . Ankle surgury  2001 bilateral    with bone spurs and torn tendon  . Edg  2008    chronic Duodenitis  . Tonsillectomy and adenoidectomy  age 15 or 25  . Total knee arthroplasty  01/10/2012    Procedure: TOTAL KNEE ARTHROPLASTY;  Surgeon: [name], MD;  Location: [facility];  Service: Orthopedics;  Laterality: Left;  . Lumbar laminectomy/decompression microdiscectomy  09/28/2012    Procedure: LUMBAR LAMINECTOMY/DECOMPRESSION MICRODISCECTOMY 2 LEVELS;  Surgeon: [name], MD;  Location: [facility];  Service: Orthopedics;  Laterality: Left;  Decompressive laminectomy L4-L5. Microdiscectomy L5-S1  . Heel spur surgery Bilateral 2005  . Replacement total knee  2013    History   Social History  . Marital Status: Single    Spouse Name: N/A    Number of Children: 2  . Years of Education: N/A   Occupational History  . back at work at [employer] since Jan. 2010    Social History Main Topics  . Smoking status: Former Smoker -- 0.25 packs/day for 5 years    Quit date: 12/06/1978  . Smokeless tobacco: Never Used  . Alcohol Use: Yes     Comment: occasional  . Drug Use: No  . Sexual Activity: Not Currently   Other Topics Concern  . Not on file   Social History Narrative   Daily caffeine use one per day    Current Outpatient Prescriptions on File Prior to Visit  Medication Sig Dispense Refill  . ACCU-CHEK SOFTCLIX LANCETS lancets Use as directed four times daily to check blood sugar.  Diagnosis code 250.00  300 each  3  . albuterol (PROAIR HFA) 108 (90 BASE) MCG/ACT inhaler Inhale 2 puffs into the lungs every 4 (four) hours  as needed. For shortness of breath  1 Inhaler  11  . Alcohol Swabs (B-D SINGLE USE SWABS REGULAR) PADS Use as directed  200 each  3  . aspirin (ECOTRIN LOW STRENGTH) 81 MG EC tablet Take 81 mg by mouth daily before breakfast.       . atorvastatin (LIPITOR) 80 MG tablet Take 80 mg by mouth daily.      . Blood Glucose Monitoring Suppl (ACCU-CHEK AVIVA PLUS) W/DEVICE KIT 1 each by Other route daily. Use as directed to check blood sugar.  1 kit  0  . budesonide-formoterol (SYMBICORT) 80-4.5 MCG/ACT inhaler Inhale 2 puffs into the lungs 2 (two) times daily.  1 Inhaler  3  . doxycycline (VIBRA-TABS) 100 MG tablet Take 1 tablet (100 mg total) by mouth 2 (two) times daily.  20 tablet  0  . escitalopram (LEXAPRO) 10 MG tablet Take 1 tablet (10 mg total) by mouth daily.  90 tablet  3  . glucose blood (ACCU-CHEK AVIVA PLUS) test  strip Use as directed four times daily to check blood sugar.  Diagnosis code 250.00  300 each  3  . guaiFENesin-codeine (ROBITUSSIN AC) 100-10 MG/5ML syrup Take 10 mLs by mouth 4 (four) times daily as needed for cough.  180 mL  0  . hydrochlorothiazide (HYDRODIURIL) 25 MG tablet Take 1 tablet (25 mg total) by mouth every morning.  90 tablet  3  . insulin detemir (LEVEMIR) 100 UNIT/ML injection Inject 1.9 mLs (190 Units total) into the skin at bedtime.  70 mL  11  . lisinopril (PRINIVIL,ZESTRIL) 5 MG tablet Take 5 mg by mouth daily.      . meloxicam (MOBIC) 7.5 MG tablet Take 2 tablets (15 mg total) by mouth daily.  30 tablet  0  . phentermine (ADIPEX-P) 37.5 MG tablet Take 37.5 mg by mouth daily before breakfast.      . pregabalin (LYRICA) 50 MG capsule Take 50 mg by mouth 2 (two) times daily. Unsure on dose      . warfarin (COUMADIN) 5 MG tablet Take as directed by anticoagulation clinic  210 tablet  1   No current facility-administered medications on file prior to visit.    Allergies  Allergen Reactions  . Other     NO BLOOD -JEHOVAH'S WITNESS-SIGNED REFUSAL BUT WOULD TAKE ALBUMIN    Family History  Problem Relation Age of Onset  . Hypertension Mother   . Prostate cancer Father   . Diabetes Sister     died with DM 2001-01-21, 2 more sisters with diabetes  . Diabetes Brother     BP 120/86  Pulse 73  Temp(Src) 97.8 F (36.6 C) (Oral)  Ht 6' 2" (1.88 m)  Wt 411 lb (186.428 kg)  BMI 52.75 kg/m2  SpO2 93%  Review of Systems Denies LOC and weight change.  He has slight gynecomastia of the left breast.      Objective:   Physical Exam VITAL SIGNS:  See vs page GENERAL: no distress SKIN:  Insulin injection sites at the anterior abdomen are normal Left breast: slight gynecomastia.     Assessment & Plan:  DM: apparently well-controlled Gynecomastia: uncertain etiology.  pt declines labs for this now.  He says he'll do with next a1c Morbid obesity: this complicates the rx of DM.     Renal insufficiency: this increases the risk of hypoglycemia.

## 2014-03-26 NOTE — Patient Instructions (Addendum)
check your blood sugar twice a day.  vary the time of day when you check, between before the 3 meals, and at bedtime.  also check if you have symptoms of your blood sugar being too high or too low.  please keep a record of the readings and bring it to your next appointment here.  please call us sooner if your blood sugar goes below 70, or if you have a lot of readings over 200.   Please continue NPH, 140 units each morning.  However, if you are going to be active, take just 100 units. Please come back for a follow-up appointment in 2 months.

## 2014-03-26 NOTE — Telephone Encounter (Signed)
Relevant patient education assigned to patient using Emmi. ° °

## 2014-04-02 ENCOUNTER — Telehealth: Payer: Self-pay

## 2014-04-02 NOTE — Telephone Encounter (Signed)
Document is under the media tab, 03/21/13.

## 2014-04-02 NOTE — Telephone Encounter (Signed)
Form faxed to [facility].

## 2014-04-02 NOTE — Telephone Encounter (Signed)
Marcie Bal from Medical records called stating they received a form from [place] at [facility] requesting the last 6 months of the diet plan the pt was on.  Please advise we could not located the plan. Thanks!

## 2014-04-09 ENCOUNTER — Ambulatory Visit: Payer: Self-pay | Admitting: Internal Medicine

## 2014-04-12 ENCOUNTER — Ambulatory Visit (INDEPENDENT_AMBULATORY_CARE_PROVIDER_SITE_OTHER): Payer: Medicare Other | Admitting: Ophthalmology

## 2014-04-12 DIAGNOSIS — H35039 Hypertensive retinopathy, unspecified eye: Secondary | ICD-10-CM

## 2014-04-12 DIAGNOSIS — E1139 Type 2 diabetes mellitus with other diabetic ophthalmic complication: Secondary | ICD-10-CM

## 2014-04-12 DIAGNOSIS — E11319 Type 2 diabetes mellitus with unspecified diabetic retinopathy without macular edema: Secondary | ICD-10-CM

## 2014-04-12 DIAGNOSIS — H35419 Lattice degeneration of retina, unspecified eye: Secondary | ICD-10-CM

## 2014-04-12 DIAGNOSIS — I10 Essential (primary) hypertension: Secondary | ICD-10-CM

## 2014-04-12 DIAGNOSIS — E1165 Type 2 diabetes mellitus with hyperglycemia: Secondary | ICD-10-CM

## 2014-04-12 DIAGNOSIS — H33309 Unspecified retinal break, unspecified eye: Secondary | ICD-10-CM

## 2014-04-12 DIAGNOSIS — H43819 Vitreous degeneration, unspecified eye: Secondary | ICD-10-CM

## 2014-04-16 ENCOUNTER — Ambulatory Visit (INDEPENDENT_AMBULATORY_CARE_PROVIDER_SITE_OTHER): Payer: Medicare Other | Admitting: Family Medicine

## 2014-04-16 DIAGNOSIS — I2699 Other pulmonary embolism without acute cor pulmonale: Secondary | ICD-10-CM

## 2014-04-16 DIAGNOSIS — Z5181 Encounter for therapeutic drug level monitoring: Secondary | ICD-10-CM

## 2014-04-16 LAB — POCT INR: INR: 2.5

## 2014-05-14 ENCOUNTER — Telehealth: Payer: Self-pay | Admitting: *Deleted

## 2014-05-14 ENCOUNTER — Other Ambulatory Visit: Payer: Self-pay | Admitting: General Practice

## 2014-05-14 ENCOUNTER — Ambulatory Visit (INDEPENDENT_AMBULATORY_CARE_PROVIDER_SITE_OTHER): Payer: Medicare Other | Admitting: General Practice

## 2014-05-14 DIAGNOSIS — I2699 Other pulmonary embolism without acute cor pulmonale: Secondary | ICD-10-CM

## 2014-05-14 DIAGNOSIS — Z5181 Encounter for therapeutic drug level monitoring: Secondary | ICD-10-CM

## 2014-05-14 LAB — POCT INR: INR: 1.7

## 2014-05-14 MED ORDER — ENOXAPARIN SODIUM 150 MG/ML ~~LOC~~ SOLN: 150.0000 mg | Freq: Two times a day (BID) | SUBCUTANEOUS | Status: DC

## 2014-05-14 NOTE — Telephone Encounter (Signed)
[name] from [place] is requesting this  [phone] has he had one since December of last year schedule for surgery on Monday

## 2014-05-14 NOTE — Telephone Encounter (Signed)
Left voicemail for [name] stating the last updated A1C we have for pt was back in December of 2014. A1C was 10.1.

## 2014-05-14 NOTE — Telephone Encounter (Signed)
Message copied by Warden Fillers on Tue May 14, 2014  3:07 PM ------      Message from: Biagio Borg      Created: Tue May 14, 2014 12:41 PM       Yes, please      ----- Message -----         From: Warden Fillers, RN         Sent: 05/14/2014  11:48 AM           To: Biagio Borg, MD            Patient is having a gastric bypass surgery on Monday and will take last dose of coumadin tomorrow.  Do you want him bridged with Lovenox?            Cindy       ------

## 2014-05-14 NOTE — Patient Instructions (Signed)
6/10 - Last dose of coumadin until after surgery.

## 2014-05-14 NOTE — Progress Notes (Signed)
Pre visit review using our clinic review tool, if applicable. No additional management support is needed unless otherwise documented below in the visit note. 

## 2014-05-15 ENCOUNTER — Telehealth: Payer: Self-pay | Admitting: General Practice

## 2014-05-15 NOTE — Telephone Encounter (Signed)
Spoke with patient to give instructions for coumadin and Lovenox pre-surgery on 6/15.  6/10 - Last dose of coumadin 6/11 - Nothing 6/12 - Lovenox X 2 6/13 - Lovenox X 2 6/14 - Lovenox X 1  Patient verbalized understanding.

## 2014-05-31 ENCOUNTER — Other Ambulatory Visit (INDEPENDENT_AMBULATORY_CARE_PROVIDER_SITE_OTHER): Payer: Medicare Other

## 2014-05-31 ENCOUNTER — Encounter: Payer: Self-pay | Admitting: Internal Medicine

## 2014-05-31 ENCOUNTER — Ambulatory Visit (INDEPENDENT_AMBULATORY_CARE_PROVIDER_SITE_OTHER): Payer: Medicare Other | Admitting: Internal Medicine

## 2014-05-31 VITALS — BP 112/76 | HR 76 | Temp 98.2°F | Ht 74.0 in | Wt 390.4 lb

## 2014-05-31 DIAGNOSIS — Z0000 Encounter for general adult medical examination without abnormal findings: Secondary | ICD-10-CM

## 2014-05-31 DIAGNOSIS — E1029 Type 1 diabetes mellitus with other diabetic kidney complication: Secondary | ICD-10-CM

## 2014-05-31 DIAGNOSIS — E119 Type 2 diabetes mellitus without complications: Secondary | ICD-10-CM

## 2014-05-31 LAB — URINALYSIS, ROUTINE W REFLEX MICROSCOPIC
BILIRUBIN URINE: NEGATIVE
HGB URINE DIPSTICK: NEGATIVE
Ketones, ur: NEGATIVE
Leukocytes, UA: NEGATIVE
Nitrite: NEGATIVE
Specific Gravity, Urine: 1.025 (ref 1.000–1.030)
TOTAL PROTEIN, URINE-UPE24: NEGATIVE
URINE GLUCOSE: NEGATIVE
Urobilinogen, UA: 0.2 (ref 0.0–1.0)
pH: 5.5 (ref 5.0–8.0)

## 2014-05-31 LAB — CBC WITH DIFFERENTIAL/PLATELET
Basophils Absolute: 0 10*3/uL (ref 0.0–0.1)
Basophils Relative: 0.4 % (ref 0.0–3.0)
EOS ABS: 0.1 10*3/uL (ref 0.0–0.7)
Eosinophils Relative: 1.6 % (ref 0.0–5.0)
HCT: 45.7 % (ref 39.0–52.0)
HEMOGLOBIN: 14.4 g/dL (ref 13.0–17.0)
LYMPHS ABS: 2.1 10*3/uL (ref 0.7–4.0)
Lymphocytes Relative: 26.8 % (ref 12.0–46.0)
MCHC: 31.5 g/dL (ref 30.0–36.0)
MCV: 71.3 fl — AB (ref 78.0–100.0)
MONO ABS: 0.5 10*3/uL (ref 0.1–1.0)
Monocytes Relative: 6.7 % (ref 3.0–12.0)
NEUTROS ABS: 5.2 10*3/uL (ref 1.4–7.7)
Neutrophils Relative %: 64.5 % (ref 43.0–77.0)
Platelets: 194 10*3/uL (ref 150.0–400.0)
RBC: 6.41 Mil/uL — ABNORMAL HIGH (ref 4.22–5.81)
RDW: 15.8 % — ABNORMAL HIGH (ref 11.5–15.5)
WBC: 8 10*3/uL (ref 4.0–10.5)

## 2014-05-31 LAB — HEPATIC FUNCTION PANEL
ALT: 26 U/L (ref 0–53)
AST: 31 U/L (ref 0–37)
Albumin: 4.3 g/dL (ref 3.5–5.2)
Alkaline Phosphatase: 67 U/L (ref 39–117)
BILIRUBIN DIRECT: 0.1 mg/dL (ref 0.0–0.3)
BILIRUBIN TOTAL: 0.7 mg/dL (ref 0.2–1.2)
Total Protein: 7.7 g/dL (ref 6.0–8.3)

## 2014-05-31 LAB — PSA: PSA: 0.65 ng/mL (ref 0.10–4.00)

## 2014-05-31 LAB — LIPID PANEL
CHOL/HDL RATIO: 3
Cholesterol: 138 mg/dL (ref 0–200)
HDL: 40.6 mg/dL (ref >39.00)
LDL Cholesterol: 81 mg/dL (ref 0–99)
NONHDL: 97.4
Triglycerides: 84 mg/dL (ref 0.0–149.0)
VLDL: 16.8 mg/dL (ref 0.0–40.0)

## 2014-05-31 LAB — HEMOGLOBIN A1C: HEMOGLOBIN A1C: 9.2 % — AB (ref 4.6–6.5)

## 2014-05-31 LAB — BASIC METABOLIC PANEL
BUN: 24 mg/dL — ABNORMAL HIGH (ref 6–23)
CALCIUM: 9.8 mg/dL (ref 8.4–10.5)
CHLORIDE: 100 meq/L (ref 96–112)
CO2: 29 mEq/L (ref 19–32)
CREATININE: 1.1 mg/dL (ref 0.4–1.5)
GFR: 85.88 mL/min (ref >60.00)
Glucose, Bld: 148 mg/dL — ABNORMAL HIGH (ref 70–99)
Potassium: 4.4 mEq/L (ref 3.5–5.1)
SODIUM: 139 meq/L (ref 135–145)

## 2014-05-31 LAB — MICROALBUMIN / CREATININE URINE RATIO
CREATININE, U: 294.3 mg/dL
MICROALB/CREAT RATIO: 0.8 mg/g (ref 0.0–30.0)
Microalb, Ur: 2.4 mg/dL — ABNORMAL HIGH (ref 0.0–1.9)

## 2014-05-31 LAB — TSH: TSH: 2.68 u[IU]/mL (ref 0.35–4.50)

## 2014-05-31 MED ORDER — PREGABALIN 50 MG PO CAPS: 50.0000 mg | ORAL_CAPSULE | Freq: Two times a day (BID) | ORAL | Status: DC

## 2014-05-31 NOTE — Patient Instructions (Signed)
Please continue all other medications as before, and refills have been done if requested.  Please have the pharmacy call with any other refills you may need.  Please continue your efforts at being more active, low cholesterol diet, and weight control.  You are otherwise up to date with prevention measures today.  Please keep your appointments with your specialists as you may have planned  Please go to the LAB in the Basement (turn left off the elevator) for the tests to be done today  You will be contacted by phone if any changes need to be made immediately.  Otherwise, you will receive a letter about your results with an explanation, but please check with MyChart first.  Please remember to sign up for MyChart if you have not done so, as this will be important to you in the future with finding out test results, communicating by private email, and scheduling acute appointments online when needed.  [name] with your gastric bypass surgury next month  Please return in 6 months, or sooner if needed

## 2014-05-31 NOTE — Progress Notes (Signed)
Subjective:    Patient ID: [name], male    DOB: 10/20/1956, 58 y.o.   MRN: [id number]  HPI  Here for wellness and f/u;  Overall doing ok;  Pt denies CP, worsening SOB, DOE, wheezing, orthopnea, PND, worsening LE edema, palpitations, dizziness or syncope.  Pt denies neurological change such as new headache, facial or extremity weakness.  Pt denies polydipsia, polyuria, or low sugar symptoms. Pt states overall good compliance with treatment and medications, good tolerability, and has been trying to follow lower cholesterol diet.  Pt denies worsening depressive symptoms, suicidal ideation or panic. No fever, night sweats, wt loss, loss of appetite, or other constitutional symptoms.  Pt states good ability with ADL's, has low fall risk, home safety reviewed and adequate, no other significant changes in hearing or vision, and only occasionally active with exercise.  Due for gastric bypass surg at [facility] next month Past Medical History  Diagnosis Date  . Diabetes mellitus     type II  . Hyperlipidemia   . Hypertension   . DVT, lower extremity     right  . Embolus 2005    pulmonary  . Depression   . Obesity   . BACK PAIN   . COLONIC POLYPS, HX OF   . DEGENERATIVE JOINT DISEASE, KNEES, BILATERAL   . DEPRESSION   . DIVERTICULOSIS, COLON   . HYPOGONADISM, MALE   . HYPOTENSION, ORTHOSTATIC   . OBESITY, MORBID   . Long term current use of anticoagulant   . Obstructive sleep apnea     uses cpap setting 8 to 10  . Kidney stones   . Renal insufficiency   . RENAL INSUFFICIENCY    Past Surgical History  Procedure Laterality Date  . Knee surgury  2000 & 2003     cartilage damage  . Ankle surgury  2001 bilateral    with bone spurs and torn tendon  . Edg  2008    chronic Duodenitis  . Tonsillectomy and adenoidectomy  age 9 or 4  . Total knee arthroplasty  01/10/2012    Procedure: TOTAL KNEE ARTHROPLASTY;  Surgeon: [name], MD;  Location: [facility];  Service: Orthopedics;  Laterality:  Left;  . Lumbar laminectomy/decompression microdiscectomy  09/28/2012    Procedure: LUMBAR LAMINECTOMY/DECOMPRESSION MICRODISCECTOMY 2 LEVELS;  Surgeon: [name], MD;  Location: [facility];  Service: Orthopedics;  Laterality: Left;  Decompressive laminectomy L4-L5. Microdiscectomy L5-S1  . Heel spur surgery Bilateral 2005  . Replacement total knee  2013    reports that he quit smoking about 35 years ago. He has never used smokeless tobacco. He reports that he drinks alcohol. He reports that he does not use illicit drugs. family history includes Diabetes in his brother and sister; Hypertension in his mother; Prostate cancer in his father. Allergies  Allergen Reactions  . Other     NO BLOOD -JEHOVAH'S WITNESS-SIGNED REFUSAL BUT WOULD TAKE ALBUMIN   Current Outpatient Prescriptions on File Prior to Visit  Medication Sig Dispense Refill  . ACCU-CHEK SOFTCLIX LANCETS lancets Use as directed four times daily to check blood sugar.  Diagnosis code 250.00  300 each  3  . albuterol (PROAIR HFA) 108 (90 BASE) MCG/ACT inhaler Inhale 2 puffs into the lungs every 4 (four) hours as needed. For shortness of breath  1 Inhaler  11  . Alcohol Swabs (B-D SINGLE USE SWABS REGULAR) PADS Use as directed  200 each  3  . aspirin (ECOTRIN LOW STRENGTH) 81 MG EC  tablet Take 81 mg by mouth daily before breakfast.       . atorvastatin (LIPITOR) 80 MG tablet Take 80 mg by mouth daily.      . Blood Glucose Monitoring Suppl (ACCU-CHEK AVIVA PLUS) W/DEVICE KIT 1 each by Other route daily. Use as directed to check blood sugar.  1 kit  0  . budesonide-formoterol (SYMBICORT) 80-4.5 MCG/ACT inhaler Inhale 2 puffs into the lungs 2 (two) times daily.  1 Inhaler  3  . enoxaparin (LOVENOX) 150 MG/ML injection Inject 1 mL (150 mg total) into the skin every 12 (twelve) hours.  2 Syringe  0  . glucose blood (ACCU-CHEK AVIVA PLUS) test strip Use as directed four times daily to check blood sugar.  Diagnosis code 250.00  300 each  3    . hydrochlorothiazide (HYDRODIURIL) 25 MG tablet Take 1 tablet (25 mg total) by mouth every morning.  90 tablet  3  . insulin detemir (LEVEMIR) 100 UNIT/ML injection Inject 1.9 mLs (190 Units total) into the skin at bedtime.  70 mL  11  . lisinopril (PRINIVIL,ZESTRIL) 5 MG tablet Take 5 mg by mouth daily.      . meloxicam (MOBIC) 7.5 MG tablet Take 2 tablets (15 mg total) by mouth daily.  30 tablet  0  . phentermine (ADIPEX-P) 37.5 MG tablet Take 37.5 mg by mouth daily before breakfast.      . warfarin (COUMADIN) 5 MG tablet Take as directed by anticoagulation clinic  210 tablet  1  . enoxaparin (LOVENOX) 40 MG/0.4ML SOLN Inject 0.4 mLs (40 mg total) into the skin every 12 (twelve) hours.  11.2 mL  0  . escitalopram (LEXAPRO) 10 MG tablet Take 1 tablet (10 mg total) by mouth daily.  90 tablet  3   No current facility-administered medications on file prior to visit.   Review of Systems Constitutional: Negative for increased diaphoresis, other activity, appetite or other siginficant weight change  HENT: Negative for worsening hearing loss, ear pain, facial swelling, mouth sores and neck stiffness.   Eyes: Negative for other worsening pain, redness or visual disturbance.  Respiratory: Negative for shortness of breath and wheezing.   Cardiovascular: Negative for chest pain and palpitations.  Gastrointestinal: Negative for diarrhea, blood in stool, abdominal distention or other pain Genitourinary: Negative for hematuria, flank pain or change in urine volume.  Musculoskeletal: Negative for myalgias or other joint complaints.  Skin: Negative for color change and wound.  Neurological: Negative for syncope and numbness. other than noted Hematological: Negative for adenopathy. or other swelling Psychiatric/Behavioral: Negative for hallucinations, self-injury, decreased concentration or other worsening agitation.      Objective:   Physical Exam BP 112/76  Pulse 76  Temp(Src) 98.2 F (36.8 C)  (Oral)  Ht _0  (1.88 m)  Wt 390 lb 6 oz (177.073 kg)  BMI 50.10 kg/m2  SpO2 92% VS noted,  Constitutional: Pt is oriented to person, place, and time. Appears well-developed and well-nourished.  Head: Normocephalic and atraumatic.  Right Ear: External ear normal.  Left Ear: External ear normal.  Nose: Nose normal.  Mouth/Throat: Oropharynx is clear and moist.  Eyes: Conjunctivae and EOM are normal. Pupils are equal, round, and reactive to light.  Neck: Normal range of motion. Neck supple. No JVD present. No tracheal deviation present.  Cardiovascular: Normal rate, regular rhythm, normal heart sounds and intact distal pulses.   Pulmonary/Chest: Effort normal and breath sounds without rales or wheezing  Abdominal: Soft. Bowel sounds are normal. NT. No  HSM  Musculoskeletal: Normal range of motion. Exhibits no edema.  Lymphadenopathy:  Has no cervical adenopathy.  Neurological: Pt is alert and oriented to person, place, and time. Pt has normal reflexes. No cranial nerve deficit. Motor grossly intact Skin: Skin is warm and dry. No rash noted.  Psychiatric:  Has normal mood and affect. Behavior is normal.      Assessment & Plan:

## 2014-06-02 NOTE — Assessment & Plan Note (Signed)

## 2014-06-11 ENCOUNTER — Telehealth: Payer: Self-pay

## 2014-06-11 DIAGNOSIS — E1029 Type 1 diabetes mellitus with other diabetic kidney complication: Secondary | ICD-10-CM

## 2014-06-11 NOTE — Telephone Encounter (Signed)
Diabetic bundle- lipid,a1c and bmet ordered 

## 2014-06-15 ENCOUNTER — Other Ambulatory Visit: Payer: Self-pay | Admitting: Internal Medicine

## 2014-06-17 ENCOUNTER — Other Ambulatory Visit: Payer: Self-pay | Admitting: Internal Medicine

## 2014-06-18 ENCOUNTER — Encounter: Payer: Self-pay | Admitting: Endocrinology

## 2014-06-18 ENCOUNTER — Ambulatory Visit (INDEPENDENT_AMBULATORY_CARE_PROVIDER_SITE_OTHER): Payer: Medicare Other | Admitting: Endocrinology

## 2014-06-18 VITALS — BP 122/80 | HR 72 | Temp 98.7°F | Ht 74.0 in | Wt 392.0 lb

## 2014-06-18 DIAGNOSIS — E119 Type 2 diabetes mellitus without complications: Secondary | ICD-10-CM

## 2014-06-18 NOTE — Progress Notes (Signed)
Subjective:    Patient ID: [name], male    DOB: [birth year], 58 y.o.   MRN: [id number]  HPI Pt returns for f/u of insulin-requiring DM (dx'ed 1996, when he presented with polyuria; he has been on insulin since 2004; he has mild if any neuropathy of the lower extremities, but he has associated renal insufficiency and autonomic neuropathy; he has never had pancreatitis, severe hypoglycemia, or DKA; in 2014, he was changed to QD insulin, after he achieved poor control on multiple daily injections; in late 2014, he changed to human NPH, due to cost).   Gastric bypass is scheduled for next month, at [facility].  He has lost weight, due to his efforts.  He had reduced NPH to 80 units qam.  no cbg record, but states cbg's are well-controlled.   Past Medical History  Diagnosis Date  . Diabetes mellitus     type II  . Hyperlipidemia   . Hypertension   . DVT, lower extremity     right  . Embolus 2005    pulmonary  . Depression   . Obesity   . BACK PAIN   . COLONIC POLYPS, HX OF   . DEGENERATIVE JOINT DISEASE, KNEES, BILATERAL   . DEPRESSION   . DIVERTICULOSIS, COLON   . HYPOGONADISM, MALE   . HYPOTENSION, ORTHOSTATIC   . OBESITY, MORBID   . Long term current use of anticoagulant   . Obstructive sleep apnea     uses cpap setting 8 to 10  . Kidney stones   . Renal insufficiency   . RENAL INSUFFICIENCY     Past Surgical History  Procedure Laterality Date  . Knee surgury  2000 & 2003     cartilage damage  . Ankle surgury  2001 bilateral    with bone spurs and torn tendon  . Edg  2008    chronic Duodenitis  . Tonsillectomy and adenoidectomy  age 74 or 20  . Total knee arthroplasty  01/10/2012    Procedure: TOTAL KNEE ARTHROPLASTY;  Surgeon: [name], MD;  Location: [facility];  Service: Orthopedics;  Laterality: Left;  . Lumbar laminectomy/decompression microdiscectomy  09/28/2012    Procedure: LUMBAR LAMINECTOMY/DECOMPRESSION MICRODISCECTOMY 2 LEVELS;  Surgeon: [name],  MD;  Location: [facility];  Service: Orthopedics;  Laterality: Left;  Decompressive laminectomy L4-L5. Microdiscectomy L5-S1  . Heel spur surgery Bilateral 2005  . Replacement total knee  2013    History   Social History  . Marital Status: Single    Spouse Name: N/A    Number of Children: 2  . Years of Education: N/A   Occupational History  . back at work at [employer] since Jan. 2010    Social History Main Topics  . Smoking status: Former Smoker -- 0.25 packs/day for 5 years    Quit date: 12/06/1978  . Smokeless tobacco: Never Used  . Alcohol Use: Yes     Comment: occasional  . Drug Use: No  . Sexual Activity: Not Currently   Other Topics Concern  . Not on file   Social History Narrative   Daily caffeine use one per day    Current Outpatient Prescriptions on File Prior to Visit  Medication Sig Dispense Refill  . ACCU-CHEK SOFTCLIX LANCETS lancets Use as directed four times daily to check blood sugar.  Diagnosis code 250.00  300 each  3  . albuterol (PROAIR HFA) 108 (90 BASE) MCG/ACT inhaler Inhale 2 puffs into the lungs every 4 (  four) hours as needed. For shortness of breath  1 Inhaler  11  . Alcohol Swabs (B-D SINGLE USE SWABS REGULAR) PADS Use as directed  200 each  3  . aspirin (ECOTRIN LOW STRENGTH) 81 MG EC tablet Take 81 mg by mouth daily before breakfast.       . atorvastatin (LIPITOR) 80 MG tablet Take 80 mg by mouth daily.      . Blood Glucose Monitoring Suppl (ACCU-CHEK AVIVA PLUS) W/DEVICE KIT 1 each by Other route daily. Use as directed to check blood sugar.  1 kit  0  . budesonide-formoterol (SYMBICORT) 80-4.5 MCG/ACT inhaler Inhale 2 puffs into the lungs 2 (two) times daily.  1 Inhaler  3  . enoxaparin (LOVENOX) 150 MG/ML injection Inject 1 mL (150 mg total) into the skin every 12 (twelve) hours.  2 Syringe  0  . glucose blood (ACCU-CHEK AVIVA PLUS) test strip Use as directed four times daily to check blood sugar.  Diagnosis code 250.00  300 each  3  .  hydrochlorothiazide (HYDRODIURIL) 25 MG tablet Take 1 tablet (25 mg total) by mouth every morning.  90 tablet  3  . hydrochlorothiazide (HYDRODIURIL) 25 MG tablet TAKE 1 TABLET BY MOUTH EVERY DAY  90 tablet  3  . hydrochlorothiazide (HYDRODIURIL) 25 MG tablet TAKE 1 TABLET BY MOUTH EVERY DAY  90 tablet  3  . lisinopril (PRINIVIL,ZESTRIL) 5 MG tablet Take 5 mg by mouth daily.      . meloxicam (MOBIC) 7.5 MG tablet Take 2 tablets (15 mg total) by mouth daily.  30 tablet  0  . phentermine (ADIPEX-P) 37.5 MG tablet Take 37.5 mg by mouth daily before breakfast.      . pregabalin (LYRICA) 50 MG capsule Take 1 capsule (50 mg total) by mouth 2 (two) times daily. Unsure on dose  60 capsule  11  . warfarin (COUMADIN) 5 MG tablet Take as directed by anticoagulation clinic  210 tablet  1  . enoxaparin (LOVENOX) 40 MG/0.4ML SOLN Inject 0.4 mLs (40 mg total) into the skin every 12 (twelve) hours.  11.2 mL  0  . escitalopram (LEXAPRO) 10 MG tablet Take 1 tablet (10 mg total) by mouth daily.  90 tablet  3   No current facility-administered medications on file prior to visit.    Allergies  Allergen Reactions  . Other     NO BLOOD -JEHOVAH'S WITNESS-SIGNED REFUSAL BUT WOULD TAKE ALBUMIN    Family History  Problem Relation Age of Onset  . Hypertension Mother   . Prostate cancer Father   . Diabetes Sister     died with DM 02/10/01, 2 more sisters with diabetes  . Diabetes Brother     BP 122/80  Pulse 72  Temp(Src) 98.7 F (37.1 C) (Oral)  Ht '6\' 2"'  (1.88 m)  Wt 392 lb (177.81 kg)  BMI 50.31 kg/m2  SpO2 91%  Review of Systems He denies hypoglycemia and n/v.    Objective:   Physical Exam Pulses: dorsalis pedis intact bilat.  Feet: no deformity. normal color and temp. Trace bilat leg edema. Both great toenails are absent.  Skin: no ulcer on the feet.  Neuro: sensation is intact to touch on the feet.   Lab Results  Component Value Date   HGBA1C 9.2* 05/31/2014      Assessment & Plan:  Weight  loss: he'll need to carefully check cbg's.   DM: moderate exacerbation   Patient is advised the following: Patient Instructions  Please increase  the NPH insulin to 85 units each morning. check your blood sugar twice a day.  vary the time of day when you check, between before the 3 meals, and at bedtime.  also check if you have symptoms of your blood sugar being too high or too low.  please keep a record of the readings and bring it to your next appointment here.  You can write it on any piece of paper.  please call us sooner if your blood sugar goes below 70, or if you have a lot of readings over 200. Please come back here 1-2 weeks after your surgery.

## 2014-06-18 NOTE — Patient Instructions (Signed)
Please increase the NPH insulin to 85 units each morning. check your blood sugar twice a day.  vary the time of day when you check, between before the 3 meals, and at bedtime.  also check if you have symptoms of your blood sugar being too high or too low.  please keep a record of the readings and bring it to your next appointment here.  You can write it on any piece of paper.  please call us sooner if your blood sugar goes below 70, or if you have a lot of readings over 200. Please come back here 1-2 weeks after your surgery.

## 2014-06-19 ENCOUNTER — Encounter: Payer: Self-pay | Admitting: Internal Medicine

## 2014-06-19 ENCOUNTER — Other Ambulatory Visit (INDEPENDENT_AMBULATORY_CARE_PROVIDER_SITE_OTHER): Payer: Medicare Other

## 2014-06-19 ENCOUNTER — Ambulatory Visit (INDEPENDENT_AMBULATORY_CARE_PROVIDER_SITE_OTHER): Payer: Medicare Other | Admitting: Internal Medicine

## 2014-06-19 DIAGNOSIS — Z5181 Encounter for therapeutic drug level monitoring: Secondary | ICD-10-CM

## 2014-06-19 DIAGNOSIS — L988 Other specified disorders of the skin and subcutaneous tissue: Secondary | ICD-10-CM

## 2014-06-19 DIAGNOSIS — E1029 Type 1 diabetes mellitus with other diabetic kidney complication: Secondary | ICD-10-CM

## 2014-06-19 LAB — CBC WITH DIFFERENTIAL/PLATELET
BASOS ABS: 0 10*3/uL (ref 0.0–0.1)
Basophils Relative: 0.3 % (ref 0.0–3.0)
EOS ABS: 0.2 10*3/uL (ref 0.0–0.7)
Eosinophils Relative: 2 % (ref 0.0–5.0)
HCT: 44.1 % (ref 39.0–52.0)
Hemoglobin: 13.8 g/dL (ref 13.0–17.0)
Lymphocytes Relative: 29 % (ref 12.0–46.0)
Lymphs Abs: 2.2 10*3/uL (ref 0.7–4.0)
MCHC: 31.3 g/dL (ref 30.0–36.0)
MCV: 71.8 fl — AB (ref 78.0–100.0)
MONO ABS: 0.7 10*3/uL (ref 0.1–1.0)
Monocytes Relative: 8.6 % (ref 3.0–12.0)
NEUTROS PCT: 60.1 % (ref 43.0–77.0)
Neutro Abs: 4.6 10*3/uL (ref 1.4–7.7)
PLATELETS: 175 10*3/uL (ref 150.0–400.0)
RBC: 6.14 Mil/uL — ABNORMAL HIGH (ref 4.22–5.81)
RDW: 15.7 % — AB (ref 11.5–15.5)
WBC: 7.6 10*3/uL (ref 4.0–10.5)

## 2014-06-19 LAB — PROTIME-INR
INR: 2.9 ratio — AB (ref 0.8–1.0)
PROTHROMBIN TIME: 30.8 s — AB (ref 9.6–13.1)

## 2014-06-19 MED ORDER — MUPIROCIN 2 % EX OINT: TOPICAL_OINTMENT | CUTANEOUS | Status: DC

## 2014-06-19 NOTE — Patient Instructions (Signed)
Dip gauze in  sterile saline and applied to the wound twice a day. Cover the wound with Telfa , non stick dressing  without any antibiotic ointment UNLESS PUS IS PRESENT ! The saline can be purchased at the drugstore or you can make your own .Boil cup of salt in a gallon of water. Store mixture  in a clean container.Report Warning  signs as discussed (red streaks, pus, fever, increasing pain).  Your next office appointment will be determined based upon review of your pending labs  Those instructions will be transmitted to you through My Chart .Followup as needed for your acute issue. Please report any significant change in your symptoms.

## 2014-06-19 NOTE — Progress Notes (Signed)
Pre visit review using our clinic review tool, if applicable. No additional management support is needed unless otherwise documented below in the visit note. 

## 2014-06-19 NOTE — Progress Notes (Signed)
Subjective:    Patient ID: [name], male    DOB: 1956-07-08, 58 y.o.   MRN: [id number]  HPI    He's had some swelling and some bleeding from the umbilicus since 01/01/50. He is having discomfort around the navel area. He's noticed some "milky bloody" discharge from the umbilicus.  The pain has progressed over the 7/11-7/13 period.  He is to have bariatric surgery in August at [facility] by Dr. Toney Rakes. He is waiting for the definitive scheduling  A month ago his INR was 1.7 ;he attributes this  to increased ingestion of greens. He is due for a repeat PT/INR @ this time  He is an insulin-dependent diabetic. He states his fasting blood sugars range 80-115. 2 hours after meal glucoses range 178-190. He has occasional hypoglycemia. The last episode was when he delayed lunch several weeks ago and noted a glucose of 64 at 2 PM    Review of Systems Epistaxis, hemoptysis, hematuria, melena, or rectal bleeding denied. No unexplained weight loss, significant dyspepsia,dysphagia, or abdominal pain.  There is no abnormal bruising , bleeding, or difficulty stopping bleeding with injury.  He denies fever, chills, or sweats.     Objective:   Physical Exam   Significant or distinguishing  findings on physical exam are documented first.  Below that are other systems examined & findings.   He is morbidly obese; he is in no acute distress Ptosis of the right eye is present Heart sounds are distant. He has a 4 x 3 mm umbilical polyp with denudation of the surface & associated maceration. No purulence is suggested clinically Deep tendon reflexes are 0+ at the left knee Trace edema.   Gen.:  Alert, appropriate and cooperative throughout exam. Head: Normocephalic without obvious abnormalities Eyes: No corneal or conjunctival inflammation noted.  Ears: External  ear exam reveals no significant lesions or deformities.   Nose: External nasal exam reveals no deformity or inflammation.  Nasal mucosa are pink and moist. No lesions or exudates noted.   Mouth: Oral mucosa and oropharynx reveal no lesions or exudates. Teeth in good repair. Neck: No deformities, masses, or tenderness noted. Range of motion & Thyroid normal Lungs: Normal respiratory effort; chest expands symmetrically. Lungs are clear to auscultation without rales, wheezes, or increased work of breathing. Heart: Normal rate and rhythm. Normal S1 and S2. No gallop, click, or rub. No murmur. Abdomen: Bowel sounds normal; abdomen soft and nontender. Dullness to percussion RUQ. No organomegaly or hernias noted.                         Musculoskeletal/extremities: No deformity or scoliosis noted of  the thoracic or lumbar spine. No clubbing, cyanosis, or significant extremity  deformity noted. Range of motion normal .Tone & strength normal. Hand joints normal  Fingernail health good.  Able to lie down & sit up w/o help. Negative SLR bilaterally Vascular: Carotid, radial artery, dorsalis pedis and  posterior tibial pulses are full and equal. No bruits present. Neurologic: Alert and oriented x3.  Gait normal      Skin: Intact without suspicious lesions or rashes. Lymph: No cervical, axillary lymphadenopathy present. Psych: Mood and affect are normal. Normally interactive  Assessment & Plan:  #1 umbilical polyp which is abraded and bleeding intermittently  #2 questionable present anticoagulation status  #3 diabetes; home glucose is indicated good control. He has had occasional hypoglycemia with delayed meals.  #4 morbid obesity; bariatric surgery pending  Plan: CBC and differential, PT/INR, and notification of Dr. Toney Rakes.  Wet-to-dry dressings with application of generic Bactroban if there is evidence of purulence.

## 2014-06-26 ENCOUNTER — Ambulatory Visit (INDEPENDENT_AMBULATORY_CARE_PROVIDER_SITE_OTHER): Payer: Medicare Other | Admitting: General Practice

## 2014-06-26 DIAGNOSIS — Z5181 Encounter for therapeutic drug level monitoring: Secondary | ICD-10-CM

## 2014-06-26 DIAGNOSIS — I2699 Other pulmonary embolism without acute cor pulmonale: Secondary | ICD-10-CM

## 2014-06-26 LAB — POCT INR: INR: 2.8

## 2014-06-26 NOTE — Progress Notes (Signed)
Pre visit review using our clinic review tool, if applicable. No additional management support is needed unless otherwise documented below in the visit note. 

## 2014-06-26 NOTE — Patient Instructions (Signed)
Take last dose of coumadin on 8/19.

## 2014-07-04 ENCOUNTER — Ambulatory Visit: Payer: Self-pay | Admitting: Internal Medicine

## 2014-07-17 DIAGNOSIS — Z789 Other specified health status: Secondary | ICD-10-CM | POA: Insufficient documentation

## 2014-07-22 ENCOUNTER — Other Ambulatory Visit: Payer: Self-pay | Admitting: Internal Medicine

## 2014-08-06 ENCOUNTER — Ambulatory Visit: Payer: Self-pay

## 2014-08-07 DIAGNOSIS — R791 Abnormal coagulation profile: Secondary | ICD-10-CM | POA: Insufficient documentation

## 2014-08-07 DIAGNOSIS — Z9884 Bariatric surgery status: Secondary | ICD-10-CM | POA: Insufficient documentation

## 2014-08-13 ENCOUNTER — Ambulatory Visit (INDEPENDENT_AMBULATORY_CARE_PROVIDER_SITE_OTHER): Payer: Medicare Other | Admitting: *Deleted

## 2014-08-13 DIAGNOSIS — I2699 Other pulmonary embolism without acute cor pulmonale: Secondary | ICD-10-CM

## 2014-08-13 DIAGNOSIS — Z5181 Encounter for therapeutic drug level monitoring: Secondary | ICD-10-CM

## 2014-08-13 LAB — POCT INR: INR: 1.5

## 2014-09-03 ENCOUNTER — Ambulatory Visit (INDEPENDENT_AMBULATORY_CARE_PROVIDER_SITE_OTHER): Payer: Medicare Other | Admitting: *Deleted

## 2014-09-03 DIAGNOSIS — Z5181 Encounter for therapeutic drug level monitoring: Secondary | ICD-10-CM

## 2014-09-03 DIAGNOSIS — I2699 Other pulmonary embolism without acute cor pulmonale: Secondary | ICD-10-CM

## 2014-09-03 LAB — POCT INR: INR: 1.2

## 2014-09-10 ENCOUNTER — Ambulatory Visit (INDEPENDENT_AMBULATORY_CARE_PROVIDER_SITE_OTHER): Payer: Medicare Other | Admitting: *Deleted

## 2014-09-10 DIAGNOSIS — Z5181 Encounter for therapeutic drug level monitoring: Secondary | ICD-10-CM

## 2014-09-10 DIAGNOSIS — I2699 Other pulmonary embolism without acute cor pulmonale: Secondary | ICD-10-CM

## 2014-09-10 LAB — POCT INR: INR: 1.2

## 2014-09-24 ENCOUNTER — Other Ambulatory Visit: Payer: Self-pay | Admitting: *Deleted

## 2014-09-24 ENCOUNTER — Ambulatory Visit (INDEPENDENT_AMBULATORY_CARE_PROVIDER_SITE_OTHER): Payer: Medicare Other | Admitting: *Deleted

## 2014-09-24 DIAGNOSIS — I2699 Other pulmonary embolism without acute cor pulmonale: Secondary | ICD-10-CM

## 2014-09-24 DIAGNOSIS — Z5181 Encounter for therapeutic drug level monitoring: Secondary | ICD-10-CM

## 2014-09-24 LAB — POCT INR: INR: 2.5

## 2014-09-24 MED ORDER — WARFARIN SODIUM 5 MG PO TABS: ORAL_TABLET | ORAL | Status: DC

## 2014-09-27 ENCOUNTER — Telehealth: Payer: Self-pay | Admitting: Endocrinology

## 2014-09-27 MED ORDER — "INSULIN SYRINGE-NEEDLE U-100 30G X 5/16"" 0.5 ML MISC": Status: DC

## 2014-09-27 NOTE — Telephone Encounter (Signed)
Pt needs 8 mm 31 g BD syringes please

## 2014-09-27 NOTE — Telephone Encounter (Signed)
Rx sent to pharmacy   

## 2014-09-30 ENCOUNTER — Encounter: Payer: Self-pay | Admitting: Endocrinology

## 2014-09-30 ENCOUNTER — Ambulatory Visit (INDEPENDENT_AMBULATORY_CARE_PROVIDER_SITE_OTHER): Payer: Medicare Other | Admitting: Endocrinology

## 2014-09-30 VITALS — BP 171/90 | HR 57 | Temp 98.0°F | Ht 74.0 in | Wt 388.0 lb

## 2014-09-30 DIAGNOSIS — N189 Chronic kidney disease, unspecified: Secondary | ICD-10-CM

## 2014-09-30 DIAGNOSIS — Z23 Encounter for immunization: Secondary | ICD-10-CM

## 2014-09-30 DIAGNOSIS — E1022 Type 1 diabetes mellitus with diabetic chronic kidney disease: Secondary | ICD-10-CM

## 2014-09-30 LAB — BASIC METABOLIC PANEL
BUN: 10 mg/dL (ref 6–23)
CHLORIDE: 106 meq/L (ref 96–112)
CO2: 27 mEq/L (ref 19–32)
CREATININE: 1 mg/dL (ref 0.4–1.5)
Calcium: 9 mg/dL (ref 8.4–10.5)
GFR: 102.48 mL/min (ref >60.00)
Glucose, Bld: 81 mg/dL (ref 70–99)
Potassium: 4.3 mEq/L (ref 3.5–5.1)
Sodium: 140 mEq/L (ref 135–145)

## 2014-09-30 LAB — LIPID PANEL
Cholesterol: 81 mg/dL (ref 0–200)
HDL: 28.2 mg/dL — ABNORMAL LOW (ref >39.00)
LDL CALC: 37 mg/dL (ref 0–99)
NONHDL: 52.8
Total CHOL/HDL Ratio: 3
Triglycerides: 78 mg/dL (ref 0.0–149.0)
VLDL: 15.6 mg/dL (ref 0.0–40.0)

## 2014-09-30 LAB — TSH: TSH: 1.3 u[IU]/mL (ref 0.35–4.50)

## 2014-09-30 LAB — HEMOGLOBIN A1C: Hgb A1c MFr Bld: 6.2 % (ref 4.6–6.5)

## 2014-09-30 MED ORDER — METFORMIN HCL 500 MG PO TABS: 500.0000 mg | ORAL_TABLET | Freq: Two times a day (BID) | ORAL | Status: DC

## 2014-09-30 NOTE — Patient Instructions (Addendum)
i have sent a prescription to your pharmacy, to start metformin. blood tests are being requested for you today.  We'll contact you with results. check your blood sugar twice a day.  vary the time of day when you check, between before the 3 meals, and at bedtime.  also check if you have symptoms of your blood sugar being too high or too low.  please keep a record of the readings and bring it to your next appointment here.  You can write it on any piece of paper.  please call us sooner if your blood sugar goes below 70, or if you have a lot of readings over 200. Please come back for a follow-up appointment in 1 month. Please continue the same medications, for your blood pressure. Please reduce the insulin to 10 units each morning.  You may be able to stop this soon.

## 2014-09-30 NOTE — Progress Notes (Signed)
Subjective:    Patient ID: [name], male    DOB: Jul 23, 1956, 58 y.o.   MRN: [id number]  HPI Pt returns for f/u of diabetes mellitus: DM type: Insulin-requiring type 2 Dx'ed: 8250 Complications: renal insufficiency and autonomic neuropathy Therapy: insulin since 2004 DKA: never Severe hypoglycemia: never Pancreatitis: never Other: in 2014, he was changed to QD insulin, after he achieved poor control on multiple daily injections; in late 2014, he changed to human NPH, due to cost Interval history: He has gastric bypass surgery at [facility], 2 mos ago.  Postoperatively, he needed and had endoscopic esophageal dilation.  Since then, he has lost 55 lbs.  He seldom needs insulin.  When he takes it, he takes 35 units.  He has mild hypoglycemia, when he takes the insulin when he does no need it.   Past Medical History  Diagnosis Date  . Diabetes mellitus     type II  . Hyperlipidemia   . Hypertension   . DVT, lower extremity     right  . Embolus 2005    pulmonary  . Depression   . Obesity   . BACK PAIN   . COLONIC POLYPS, HX OF   . DEGENERATIVE JOINT DISEASE, KNEES, BILATERAL   . DEPRESSION   . DIVERTICULOSIS, COLON   . HYPOGONADISM, MALE   . HYPOTENSION, ORTHOSTATIC   . OBESITY, MORBID   . Long term current use of anticoagulant   . Obstructive sleep apnea     uses cpap setting 8 to 10  . Kidney stones   . Renal insufficiency   . RENAL INSUFFICIENCY     Past Surgical History  Procedure Laterality Date  . Knee surgury  2000 & 2003     cartilage damage  . Ankle surgury  2001 bilateral    with bone spurs and torn tendon  . Edg  2008    chronic Duodenitis  . Tonsillectomy and adenoidectomy  age 1 or 39  . Total knee arthroplasty  01/10/2012    Procedure: TOTAL KNEE ARTHROPLASTY;  Surgeon: [name], MD;  Location: [facility];  Service: Orthopedics;  Laterality: Left;  . Lumbar laminectomy/decompression microdiscectomy  09/28/2012    Procedure: LUMBAR  LAMINECTOMY/DECOMPRESSION MICRODISCECTOMY 2 LEVELS;  Surgeon: [name], MD;  Location: [facility];  Service: Orthopedics;  Laterality: Left;  Decompressive laminectomy L4-L5. Microdiscectomy L5-S1  . Heel spur surgery Bilateral 2005  . Replacement total knee  2013    History   Social History  . Marital Status: Single    Spouse Name: N/A    Number of Children: 2  . Years of Education: N/A   Occupational History  . back at work at [employer] since Jan. 2010    Social History Main Topics  . Smoking status: Former Smoker -- 0.25 packs/day for 5 years    Quit date: 12/06/1978  . Smokeless tobacco: Never Used  . Alcohol Use: Yes     Comment: occasional  . Drug Use: No  . Sexual Activity: Not Currently   Other Topics Concern  . Not on file   Social History Narrative   Daily caffeine use one per day    Current Outpatient Prescriptions on File Prior to Visit  Medication Sig Dispense Refill  . ACCU-CHEK SOFTCLIX LANCETS lancets Use as directed four times daily to check blood sugar.  Diagnosis code 250.00  300 each  3  . albuterol (PROAIR HFA) 108 (90 BASE) MCG/ACT inhaler Inhale 2 puffs into  the lungs every 4 (four) hours as needed. For shortness of breath  1 Inhaler  11  . Alcohol Swabs (B-D SINGLE USE SWABS REGULAR) PADS Use as directed  200 each  3  . aspirin (ECOTRIN LOW STRENGTH) 81 MG EC tablet Take 81 mg by mouth daily before breakfast.       . atorvastatin (LIPITOR) 80 MG tablet Take 80 mg by mouth daily.      . Blood Glucose Monitoring Suppl (ACCU-CHEK AVIVA PLUS) W/DEVICE KIT 1 each by Other route daily. Use as directed to check blood sugar.  1 kit  0  . budesonide-formoterol (SYMBICORT) 80-4.5 MCG/ACT inhaler Inhale 2 puffs into the lungs 2 (two) times daily.  1 Inhaler  3  . enoxaparin (LOVENOX) 150 MG/ML injection Inject 1 mL (150 mg total) into the skin every 12 (twelve) hours.  2 Syringe  0  . glucose blood (ACCU-CHEK AVIVA PLUS) test strip Use as  directed four times daily to check blood sugar.  Diagnosis code 250.00  300 each  3  . hydrochlorothiazide (HYDRODIURIL) 25 MG tablet Take 1 tablet (25 mg total) by mouth every morning.  90 tablet  3  . Insulin Syringe-Needle U-100 (ADVOCATE INSULIN SYRINGE) 30G X 5/16" 0.5 ML MISC Use 1 time per day for insulin injection  100 each  0  . lisinopril (PRINIVIL,ZESTRIL) 5 MG tablet Take 5 mg by mouth daily.      . meloxicam (MOBIC) 7.5 MG tablet Take 2 tablets (15 mg total) by mouth daily.  30 tablet  0  . mupirocin ointment (BACTROBAN) 2 % Applied twice a day to the affected area;NOT into eyes.  22 g  0  . phentermine (ADIPEX-P) 37.5 MG tablet Take 37.5 mg by mouth daily before breakfast.      . pregabalin (LYRICA) 50 MG capsule Take 1 capsule (50 mg total) by mouth 2 (two) times daily. Unsure on dose  60 capsule  11  . warfarin (COUMADIN) 5 MG tablet Take as directed by anticoagulation clinic  210 tablet  1   No current facility-administered medications on file prior to visit.    Allergies  Allergen Reactions  . Other     NO BLOOD -JEHOVAH'S WITNESS-SIGNED REFUSAL BUT WOULD TAKE ALBUMIN    Family History  Problem Relation Age of Onset  . Hypertension Mother   . Prostate cancer Father   . Diabetes Sister     died with DM February 12, 2001, 2 more sisters with diabetes  . Diabetes Brother     BP 171/90  Pulse 57  Temp(Src) 98 F (36.7 C) (Oral)  Ht '6\' 2"'  (1.88 m)  Wt 388 lb (175.996 kg)  BMI 49.80 kg/m2  SpO2 96%  Review of Systems Denies n/v and LOC    Objective:   Physical Exam VITAL SIGNS:  See vs page.  GENERAL: no distress. Pulses: dorsalis pedis intact bilat.  Feet: no deformity. normal color and temp. Trace bilat leg edema. Both great toenails are absent.  Skin: no ulcer on the feet.  Neuro: sensation is intact to touch on the feet.    Lab Results  Component Value Date   HGBA1C 6.2 09/30/2014      Assessment & Plan:  DM: well-controlled Morbid obesity:  better Bariatric surg status: i told pt he should crush metformin.  Patient is advised the following: Patient Instructions  i have sent a prescription to your pharmacy, to start metformin. blood tests are being requested for you today.  We'll contact  you with results. check your blood sugar twice a day.  vary the time of day when you check, between before the 3 meals, and at bedtime.  also check if you have symptoms of your blood sugar being too high or too low.  please keep a record of the readings and bring it to your next appointment here.  You can write it on any piece of paper.  please call us sooner if your blood sugar goes below 70, or if you have a lot of readings over 200. Please come back for a follow-up appointment in 1 month. Please continue the same medications, for your blood pressure. Please reduce the insulin to 10 units each morning.  You may be able to stop this soon.  addendum: d/c insulin

## 2014-10-01 ENCOUNTER — Encounter: Payer: Self-pay | Admitting: Endocrinology

## 2014-10-01 DIAGNOSIS — E119 Type 2 diabetes mellitus without complications: Secondary | ICD-10-CM

## 2014-10-01 DIAGNOSIS — E1122 Type 2 diabetes mellitus with diabetic chronic kidney disease: Secondary | ICD-10-CM | POA: Insufficient documentation

## 2014-10-01 HISTORY — DX: Type 2 diabetes mellitus without complications: E11.9

## 2014-10-22 ENCOUNTER — Ambulatory Visit (INDEPENDENT_AMBULATORY_CARE_PROVIDER_SITE_OTHER): Payer: Medicare Other | Admitting: Certified Registered Nurse Anesthetist

## 2014-10-22 ENCOUNTER — Telehealth: Payer: Self-pay | Admitting: Family

## 2014-10-22 DIAGNOSIS — Z5181 Encounter for therapeutic drug level monitoring: Secondary | ICD-10-CM

## 2014-10-22 DIAGNOSIS — I2699 Other pulmonary embolism without acute cor pulmonale: Secondary | ICD-10-CM

## 2014-10-22 LAB — POCT INR: INR: 4

## 2014-10-22 NOTE — Telephone Encounter (Signed)
Agree with plan and adjustment.

## 2014-11-05 ENCOUNTER — Ambulatory Visit (INDEPENDENT_AMBULATORY_CARE_PROVIDER_SITE_OTHER): Payer: Medicare Other

## 2014-11-05 DIAGNOSIS — Z5181 Encounter for therapeutic drug level monitoring: Secondary | ICD-10-CM

## 2014-11-05 DIAGNOSIS — I2699 Other pulmonary embolism without acute cor pulmonale: Secondary | ICD-10-CM

## 2014-11-05 LAB — POCT INR: INR: 2.5

## 2014-11-13 ENCOUNTER — Encounter: Payer: Self-pay | Admitting: Internal Medicine

## 2014-11-13 ENCOUNTER — Ambulatory Visit (INDEPENDENT_AMBULATORY_CARE_PROVIDER_SITE_OTHER): Payer: Medicare Other | Admitting: Internal Medicine

## 2014-11-13 VITALS — BP 120/84 | HR 53 | Temp 98.1°F | Ht 74.0 in | Wt 316.0 lb

## 2014-11-13 DIAGNOSIS — Z0000 Encounter for general adult medical examination without abnormal findings: Secondary | ICD-10-CM

## 2014-11-13 DIAGNOSIS — Z0189 Encounter for other specified special examinations: Secondary | ICD-10-CM

## 2014-11-13 DIAGNOSIS — I10 Essential (primary) hypertension: Secondary | ICD-10-CM

## 2014-11-13 DIAGNOSIS — E785 Hyperlipidemia, unspecified: Secondary | ICD-10-CM

## 2014-11-13 DIAGNOSIS — R600 Localized edema: Secondary | ICD-10-CM

## 2014-11-13 DIAGNOSIS — F32A Depression, unspecified: Secondary | ICD-10-CM

## 2014-11-13 DIAGNOSIS — F329 Major depressive disorder, single episode, unspecified: Secondary | ICD-10-CM

## 2014-11-13 DIAGNOSIS — R609 Edema, unspecified: Secondary | ICD-10-CM

## 2014-11-13 DIAGNOSIS — E119 Type 2 diabetes mellitus without complications: Secondary | ICD-10-CM

## 2014-11-13 NOTE — Patient Instructions (Signed)
Please continue all other medications as before, and refills have been done if requested.  Please have the pharmacy call with any other refills you may need.  Please continue your efforts at being more active, low cholesterol diet, and weight control.  You are otherwise up to date with prevention measures today.  Please keep your appointments with your specialists as you may have planned  Please have the Nurse re-check the weight today, and correct on chart if needed  Please return in 6 months, or sooner if needed, with Lab testing done 3-5 days before

## 2014-11-13 NOTE — Progress Notes (Signed)
Pre visit review using our clinic review tool, if applicable. No additional management support is needed unless otherwise documented below in the visit note. 

## 2014-11-13 NOTE — Assessment & Plan Note (Signed)
stable overall by history and exam, recent data reviewed with pt, and pt to continue medical treatment as before,  to f/u any worsening symptoms or concerns Lab Results  Component Value Date   WBC 7.6 06/19/2014   HGB 13.8 06/19/2014   HCT 44.1 06/19/2014   PLT 175.0 06/19/2014   GLUCOSE 81 09/30/2014   CHOL 81 09/30/2014   TRIG 78.0 09/30/2014   HDL 28.20* 09/30/2014   LDLDIRECT 121.2 05/25/2011   LDLCALC 37 09/30/2014   ALT 26 05/31/2014   AST 31 05/31/2014   NA 140 09/30/2014   K 4.3 09/30/2014   CL 106 09/30/2014   CREATININE 1.0 09/30/2014   BUN 10 09/30/2014   CO2 27 09/30/2014   TSH 1.30 09/30/2014   PSA 0.65 05/31/2014   INR 2.5 11/05/2014   HGBA1C 6.2 09/30/2014   MICROALBUR 2.4* 05/31/2014

## 2014-11-13 NOTE — Progress Notes (Signed)
Subjective:    Patient ID: [name], male    DOB: 02/27/1956, 58 y.o.   MRN: [id number]  HPI  Here to f/u; overall doing ok,  Pt denies chest pain, increased sob or doe, wheezing, orthopnea, PND, increased LE swelling, palpitations, dizziness or syncope.  Pt denies polydipsia, polyuria, or low sugar symptoms such as weakness or confusion improved with po intake.  Pt denies new neurological symptoms such as new headache, or facial or extremity weakness or numbness.   Pt states overall good compliance with meds, has been trying to follow lower cholesterol, diabetic diet, with wt overall stable,  but little exercise however.  Has lost over 100 lbs s/p bariatric surgury,  Sees endo for DM - last a1c normal oct 2015.  No new complaints Denies worsening depressive symptoms, suicidal ideation, or panic;  HCT stopped recently ? Due to lower BP Past Medical History  Diagnosis Date  . Diabetes mellitus     type II  . Hyperlipidemia   . Hypertension   . DVT, lower extremity     right  . Embolus 2005    pulmonary  . Depression   . Obesity   . BACK PAIN   . COLONIC POLYPS, HX OF   . DEGENERATIVE JOINT DISEASE, KNEES, BILATERAL   . DEPRESSION   . DIVERTICULOSIS, COLON   . HYPOGONADISM, MALE   . HYPOTENSION, ORTHOSTATIC   . OBESITY, MORBID   . Long term current use of anticoagulant   . Obstructive sleep apnea     uses cpap setting 8 to 10  . Kidney stones   . Renal insufficiency   . RENAL INSUFFICIENCY    Past Surgical History  Procedure Laterality Date  . Knee surgury  2000 & 2003     cartilage damage  . Ankle surgury  2001 bilateral    with bone spurs and torn tendon  . Edg  2008    chronic Duodenitis  . Tonsillectomy and adenoidectomy  age 61 or 5  . Total knee arthroplasty  01/10/2012    Procedure: TOTAL KNEE ARTHROPLASTY;  Surgeon: [name], MD;  Location: [facility];  Service: Orthopedics;  Laterality: Left;  . Lumbar laminectomy/decompression microdiscectomy  09/28/2012   Procedure: LUMBAR LAMINECTOMY/DECOMPRESSION MICRODISCECTOMY 2 LEVELS;  Surgeon: [name], MD;  Location: [facility];  Service: Orthopedics;  Laterality: Left;  Decompressive laminectomy L4-L5. Microdiscectomy L5-S1  . Heel spur surgery Bilateral 2005  . Replacement total knee  2013    reports that he quit smoking about 35 years ago. He has never used smokeless tobacco. He reports that he drinks alcohol. He reports that he does not use illicit drugs. family history includes Diabetes in his brother and sister; Hypertension in his mother; Prostate cancer in his father. Allergies  Allergen Reactions  . Other     NO BLOOD -JEHOVAH'S WITNESS-SIGNED REFUSAL BUT WOULD TAKE ALBUMIN   Current Outpatient Prescriptions on File Prior to Visit  Medication Sig Dispense Refill  . ACCU-CHEK SOFTCLIX LANCETS lancets Use as directed four times daily to check blood sugar.  Diagnosis code 250.00 300 each 3  . albuterol (PROAIR HFA) 108 (90 BASE) MCG/ACT inhaler Inhale 2 puffs into the lungs every 4 (four) hours as needed. For shortness of breath 1 Inhaler 11  . Alcohol Swabs (B-D SINGLE USE SWABS REGULAR) PADS Use as directed 200 each 3  . aspirin (ECOTRIN LOW STRENGTH) 81 MG EC tablet Take 81 mg by mouth daily before breakfast.     .  atorvastatin (LIPITOR) 80 MG tablet Take 80 mg by mouth daily.    . Blood Glucose Monitoring Suppl (ACCU-CHEK AVIVA PLUS) W/DEVICE KIT 1 each by Other route daily. Use as directed to check blood sugar. 1 kit 0  . budesonide-formoterol (SYMBICORT) 80-4.5 MCG/ACT inhaler Inhale 2 puffs into the lungs 2 (two) times daily. 1 Inhaler 3  . enoxaparin (LOVENOX) 150 MG/ML injection Inject 1 mL (150 mg total) into the skin every 12 (twelve) hours. 2 Syringe 0  . glucose blood (ACCU-CHEK AVIVA PLUS) test strip Use as directed four times daily to check blood sugar.  Diagnosis code 250.00 300 each 3  . lisinopril (PRINIVIL,ZESTRIL) 5 MG tablet Take 5 mg by mouth daily.    . meloxicam  (MOBIC) 7.5 MG tablet Take 2 tablets (15 mg total) by mouth daily. 30 tablet 0  . metFORMIN (GLUCOPHAGE) 500 MG tablet Take 1 tablet (500 mg total) by mouth 2 (two) times daily with a meal. 180 tablet 3  . mupirocin ointment (BACTROBAN) 2 % Applied twice a day to the affected area;NOT into eyes. 22 g 0  . phentermine (ADIPEX-P) 37.5 MG tablet Take 37.5 mg by mouth daily before breakfast.    . pregabalin (LYRICA) 50 MG capsule Take 1 capsule (50 mg total) by mouth 2 (two) times daily. Unsure on dose 60 capsule 11  . sennosides-docusate sodium (SENOKOT-S) 8.6-50 MG tablet Take 1 tablet by mouth.    . ursodiol (ACTIGALL) 300 MG capsule Take 300 mg by mouth.    . warfarin (COUMADIN) 5 MG tablet Take as directed by anticoagulation clinic 210 tablet 1   No current facility-administered medications on file prior to visit.   Wt Readings from Last 3 Encounters:  11/13/14 316 lb (143.337 kg)  09/30/14 388 lb (175.996 kg)  06/19/14 390 lb 12.8 oz (177.266 kg)    Review of Systems  Constitutional: Negative for unusual diaphoresis or other sweats  HENT: Negative for ringing in ear Eyes: Negative for double vision or worsening visual disturbance.  Respiratory: Negative for choking and stridor.   Gastrointestinal: Negative for vomiting or other signifcant bowel change Genitourinary: Negative for hematuria or decreased urine volume.  Musculoskeletal: Negative for other MSK pain or swelling Skin: Negative for color change and worsening wound.  Neurological: Negative for tremors and numbness other than noted  Psychiatric/Behavioral: Negative for decreased concentration or agitation other than above  '    Objective:   Physical Exam BP 120/84 mmHg  Pulse 53  Temp(Src) 98.1 F (36.7 C) (Oral)  Ht '6\' 2"'  (1.88 m)  Wt 316 lb (143.337 kg)  BMI 40.55 kg/m2  SpO2 95% VS noted, not ill appearing Constitutional: Pt appears well-developed, well-nourished.  HENT: Head: NCAT.  Right Ear: External ear  normal.  Left Ear: External ear normal.  Eyes: . Pupils are equal, round, and reactive to light. Conjunctivae and EOM are normal Neck: Normal range of motion. Neck supple.  Cardiovascular: Normal rate and regular rhythm.   Pulmonary/Chest: Effort normal and breath sounds normal.  Neurological: Pt is alert. Not confused , motor grossly intact Skin: Skin is warm. No rash Psychiatric: Pt behavior is normal. No agitation. not depressed appearing  Trace left pedaledema only    Assessment & Plan:

## 2014-11-13 NOTE — Assessment & Plan Note (Signed)
HCT stopped, stable overall by history and exam, recent data reviewed with pt, and pt to continue medical treatment as before,  to f/u any worsening symptoms or concerns BP Readings from Last 3 Encounters:  11/13/14 120/84  09/30/14 171/90  06/19/14 140/78

## 2014-11-13 NOTE — Assessment & Plan Note (Signed)
Trace left pedal edema only s/p stopping HCT 25 qd, stable overall by history and exam,  and pt to continue medical treatment as before,  to f/u any worsening symptoms or concerns

## 2014-11-13 NOTE — Assessment & Plan Note (Signed)
stable overall by history and exam, recent data reviewed with pt, and pt to continue medical treatment as before,  to f/u any worsening symptoms or concerns Lab Results  Component Value Date   LDLCALC 37 09/30/2014  may need to consider decrease lipitor if maintains lower LDL with diet change and wt loss

## 2014-11-13 NOTE — Assessment & Plan Note (Signed)
stable overall by history and exam, recent data reviewed with pt, and pt to continue medical treatment as before,  to f/u any worsening symptoms or concerns Lab Results  Component Value Date   HGBA1C 6.2 09/30/2014   Cont f/u with endo

## 2014-11-20 ENCOUNTER — Telehealth: Payer: Self-pay | Admitting: Family

## 2014-11-20 ENCOUNTER — Ambulatory Visit (INDEPENDENT_AMBULATORY_CARE_PROVIDER_SITE_OTHER): Payer: Medicare Other

## 2014-11-20 DIAGNOSIS — I2699 Other pulmonary embolism without acute cor pulmonale: Secondary | ICD-10-CM

## 2014-11-20 DIAGNOSIS — Z5181 Encounter for therapeutic drug level monitoring: Secondary | ICD-10-CM

## 2014-11-20 LAB — POCT INR: INR: 1.8

## 2014-11-20 NOTE — Telephone Encounter (Signed)
Agree with plan 

## 2014-12-11 ENCOUNTER — Ambulatory Visit (INDEPENDENT_AMBULATORY_CARE_PROVIDER_SITE_OTHER): Payer: PPO | Admitting: Family Medicine

## 2014-12-11 DIAGNOSIS — I2699 Other pulmonary embolism without acute cor pulmonale: Secondary | ICD-10-CM

## 2014-12-11 DIAGNOSIS — Z5181 Encounter for therapeutic drug level monitoring: Secondary | ICD-10-CM

## 2014-12-11 LAB — POCT INR: INR: 2.2

## 2014-12-21 ENCOUNTER — Other Ambulatory Visit: Payer: Self-pay | Admitting: Internal Medicine

## 2014-12-29 ENCOUNTER — Other Ambulatory Visit: Payer: Self-pay | Admitting: Internal Medicine

## 2014-12-31 NOTE — Telephone Encounter (Signed)
Done hardcopy to robin  

## 2014-12-31 NOTE — Telephone Encounter (Signed)
Faxed hardcopy for lyrica [facility] [place] [facility]

## 2015-01-08 ENCOUNTER — Ambulatory Visit (INDEPENDENT_AMBULATORY_CARE_PROVIDER_SITE_OTHER): Payer: PPO | Admitting: General Practice

## 2015-01-08 DIAGNOSIS — I2699 Other pulmonary embolism without acute cor pulmonale: Secondary | ICD-10-CM

## 2015-01-08 DIAGNOSIS — Z5181 Encounter for therapeutic drug level monitoring: Secondary | ICD-10-CM

## 2015-01-08 LAB — POCT INR: INR: 2.2

## 2015-01-08 NOTE — Progress Notes (Signed)
Pre visit review using our clinic review tool, if applicable. No additional management support is needed unless otherwise documented below in the visit note. 

## 2015-01-08 NOTE — Progress Notes (Signed)
Agree with plan 

## 2015-01-15 ENCOUNTER — Other Ambulatory Visit: Payer: Self-pay | Admitting: Internal Medicine

## 2015-02-05 ENCOUNTER — Ambulatory Visit (INDEPENDENT_AMBULATORY_CARE_PROVIDER_SITE_OTHER): Payer: PPO | Admitting: General Practice

## 2015-02-05 DIAGNOSIS — Z5181 Encounter for therapeutic drug level monitoring: Secondary | ICD-10-CM

## 2015-02-05 DIAGNOSIS — I2699 Other pulmonary embolism without acute cor pulmonale: Secondary | ICD-10-CM

## 2015-02-05 LAB — POCT INR: INR: 2.3

## 2015-02-05 NOTE — Progress Notes (Signed)
Agree with plan 

## 2015-02-05 NOTE — Progress Notes (Signed)
Pre visit review using our clinic review tool, if applicable. No additional management support is needed unless otherwise documented below in the visit note. 

## 2015-03-05 ENCOUNTER — Other Ambulatory Visit: Payer: Self-pay | Admitting: Internal Medicine

## 2015-03-19 ENCOUNTER — Ambulatory Visit (INDEPENDENT_AMBULATORY_CARE_PROVIDER_SITE_OTHER): Payer: PPO

## 2015-03-19 ENCOUNTER — Other Ambulatory Visit: Payer: Self-pay | Admitting: Internal Medicine

## 2015-03-19 DIAGNOSIS — Z5181 Encounter for therapeutic drug level monitoring: Secondary | ICD-10-CM

## 2015-03-19 DIAGNOSIS — I2699 Other pulmonary embolism without acute cor pulmonale: Secondary | ICD-10-CM | POA: Diagnosis not present

## 2015-03-19 LAB — POCT INR: INR: 1.5

## 2015-03-19 NOTE — Progress Notes (Signed)
Pre-visit discussion using our clinic review tool. No additional management support is needed unless otherwise documented below in the visit note.  

## 2015-03-19 NOTE — Progress Notes (Signed)
Agree with plan 

## 2015-03-27 ENCOUNTER — Encounter: Payer: Self-pay | Admitting: Internal Medicine

## 2015-03-27 ENCOUNTER — Ambulatory Visit (INDEPENDENT_AMBULATORY_CARE_PROVIDER_SITE_OTHER): Payer: PPO | Admitting: Internal Medicine

## 2015-03-27 VITALS — BP 128/86 | HR 55 | Temp 99.0°F | Resp 18 | Ht 74.0 in | Wt 273.0 lb

## 2015-03-27 DIAGNOSIS — R079 Chest pain, unspecified: Secondary | ICD-10-CM

## 2015-03-27 DIAGNOSIS — R300 Dysuria: Secondary | ICD-10-CM | POA: Diagnosis not present

## 2015-03-27 DIAGNOSIS — R0789 Other chest pain: Secondary | ICD-10-CM | POA: Diagnosis not present

## 2015-03-27 DIAGNOSIS — I10 Essential (primary) hypertension: Secondary | ICD-10-CM | POA: Diagnosis not present

## 2015-03-27 DIAGNOSIS — E119 Type 2 diabetes mellitus without complications: Secondary | ICD-10-CM

## 2015-03-27 MED ORDER — CIPROFLOXACIN HCL 500 MG PO TABS: 500.0000 mg | ORAL_TABLET | Freq: Two times a day (BID) | ORAL | Status: DC

## 2015-03-27 NOTE — Progress Notes (Signed)
Subjective:    Patient ID: [name], male    DOB: Jun 26, 1956, 59 y.o.   MRN: [id number]  HPI  Here with 3 days onset recurring mild sscp, sharp without radiation, not pleuritic/exertional, not assoc with sob, n/v, diaphoresis, palp or dizziness.  Some worse to move, twist, bend. Walks > 2 miles per day in the last few days as well without pain exacerbated. Pt denies new neurological symptoms such as new headache, or facial or extremity weakness or numbness  C/o mild dysuria x 2 days, but Denies urinary symptoms such as frequency, urgency, flank pain, hematuria or n/v, fever, chills.  Pt denies polydipsia, polyuria, Past Medical History  Diagnosis Date  . Diabetes mellitus     type II  . Hyperlipidemia   . Hypertension   . DVT, lower extremity     right  . Embolus 2005    pulmonary  . Depression   . Obesity   . BACK PAIN   . COLONIC POLYPS, HX OF   . DEGENERATIVE JOINT DISEASE, KNEES, BILATERAL   . DEPRESSION   . DIVERTICULOSIS, COLON   . HYPOGONADISM, MALE   . HYPOTENSION, ORTHOSTATIC   . OBESITY, MORBID   . Long term current use of anticoagulant   . Obstructive sleep apnea     uses cpap setting 8 to 10  . Kidney stones   . Renal insufficiency   . RENAL INSUFFICIENCY    Past Surgical History  Procedure Laterality Date  . Knee surgury  2000 & 2003     cartilage damage  . Ankle surgury  2001 bilateral    with bone spurs and torn tendon  . Edg  2008    chronic Duodenitis  . Tonsillectomy and adenoidectomy  age 1 or 66  . Total knee arthroplasty  01/10/2012    Procedure: TOTAL KNEE ARTHROPLASTY;  Surgeon: [name], MD;  Location: [facility];  Service: Orthopedics;  Laterality: Left;  . Lumbar laminectomy/decompression microdiscectomy  09/28/2012    Procedure: LUMBAR LAMINECTOMY/DECOMPRESSION MICRODISCECTOMY 2 LEVELS;  Surgeon: [name], MD;  Location: [facility];  Service: Orthopedics;  Laterality: Left;  Decompressive laminectomy L4-L5. Microdiscectomy L5-S1  .  Heel spur surgery Bilateral 2005  . Replacement total knee  2013    reports that he quit smoking about 36 years ago. He has never used smokeless tobacco. He reports that he drinks alcohol. He reports that he does not use illicit drugs. family history includes Diabetes in his brother and sister; Hypertension in his mother; Prostate cancer in his father. Allergies  Allergen Reactions  . Other     NO BLOOD -JEHOVAH'S WITNESS-SIGNED REFUSAL BUT WOULD TAKE ALBUMIN   Current Outpatient Prescriptions on File Prior to Visit  Medication Sig Dispense Refill  . ACCU-CHEK SOFTCLIX LANCETS lancets Use as directed four times daily to check blood sugar.  Diagnosis code 250.00 300 each 3  . albuterol (PROAIR HFA) 108 (90 BASE) MCG/ACT inhaler Inhale 2 puffs into the lungs every 4 (four) hours as needed. For shortness of breath 1 Inhaler 11  . Alcohol Swabs (B-D SINGLE USE SWABS REGULAR) PADS Use as directed 200 each 3  . aspirin (ECOTRIN LOW STRENGTH) 81 MG EC tablet Take 81 mg by mouth daily before breakfast.     . atorvastatin (LIPITOR) 80 MG tablet Take 80 mg by mouth daily.    Marland Kitchen atorvastatin (LIPITOR) 80 MG tablet TAKE 1 TABLET (80 MG TOTAL) BY MOUTH DAILY. 90 tablet 1  . Blood  Glucose Monitoring Suppl (ACCU-CHEK AVIVA PLUS) W/DEVICE KIT 1 each by Other route daily. Use as directed to check blood sugar. 1 kit 0  . budesonide-formoterol (SYMBICORT) 80-4.5 MCG/ACT inhaler Inhale 2 puffs into the lungs 2 (two) times daily. 1 Inhaler 3  . escitalopram (LEXAPRO) 10 MG tablet Take 1 tablet (10 mg total) by mouth daily. 90 tablet 1  . glucose blood (ACCU-CHEK AVIVA PLUS) test strip Use as directed four times daily to check blood sugar.  Diagnosis code 250.00 300 each 3  . lisinopril (PRINIVIL,ZESTRIL) 5 MG tablet Take 5 mg by mouth daily.    Marland Kitchen LYRICA 50 MG capsule TAKE 1 CAPSULE BY MOUTH TWICE DAILY 60 capsule 5  . metFORMIN (GLUCOPHAGE) 500 MG tablet Take 1 tablet (500 mg total) by mouth 2 (two) times daily  with a meal. 180 tablet 3  . sennosides-docusate sodium (SENOKOT-S) 8.6-50 MG tablet Take 1 tablet by mouth.    . warfarin (COUMADIN) 5 MG tablet Take as directed by anticoagulation clinic 180 tablet 1  . enoxaparin (LOVENOX) 150 MG/ML injection Inject 1 mL (150 mg total) into the skin every 12 (twelve) hours. (Patient not taking: Reported on 03/27/2015) 2 Syringe 0  . escitalopram (LEXAPRO) 10 MG tablet TAKE 1 TABLET (10 MG TOTAL) BY MOUTH DAILY. (Patient not taking: Reported on 03/27/2015) 90 tablet 3  . meloxicam (MOBIC) 7.5 MG tablet Take 2 tablets (15 mg total) by mouth daily. (Patient not taking: Reported on 03/27/2015) 30 tablet 0  . mupirocin ointment (BACTROBAN) 2 % Applied twice a day to the affected area;NOT into eyes. (Patient not taking: Reported on 03/27/2015) 22 g 0  . phentermine (ADIPEX-P) 37.5 MG tablet Take 37.5 mg by mouth daily before breakfast.     No current facility-administered medications on file prior to visit.   Review of Systems  Constitutional: Negative for unusual diaphoresis or night sweats HENT: Negative for ringing in ear or discharge Eyes: Negative for double vision or worsening visual disturbance.  Respiratory: Negative for choking and stridor.   Gastrointestinal: Negative for vomiting or other signifcant bowel change Genitourinary: Negative for hematuria or change in urine volume.  Musculoskeletal: Negative for other MSK pain or swelling Skin: Negative for color change and worsening wound.  Neurological: Negative for tremors and numbness other than noted  Psychiatric/Behavioral: Negative for decreased concentration or agitation other than above       Objective:   Physical Exam BP 128/86 mmHg  Pulse 55  Temp(Src) 99 F (37.2 C) (Oral)  Resp 18  Ht '6\' 2"'  (1.88 m)  Wt 273 lb (123.832 kg)  BMI 35.04 kg/m2  SpO2 97% VS noted,  Constitutional: Pt appears in no significant distress HENT: Head: NCAT.  Right Ear: External ear normal.  Left Ear: External  ear normal.  Eyes: . Pupils are equal, round, and reactive to light. Conjunctivae and EOM are normal Neck: Normal range of motion. Neck supple.  Cardiovascular: Normal rate and regular rhythm.   Pulmonary/Chest: Effort normal and breath sounds without rales or wheezing.  Mild tender to palpate chest wall left lower parasternal without swelling or rash Abd:  Soft, NT, ND, + BS Neurological: Pt is alert. Not confused , motor grossly intact Skin: Skin is warm. No rash, no LE edema Psychiatric: Pt behavior is normal. No agitation.      Assessment & Plan:

## 2015-03-27 NOTE — Assessment & Plan Note (Signed)
ECG reviewed as per emr, atypical, ? MSK, cant r/o cardiac - for stress test given CRF's

## 2015-03-27 NOTE — Patient Instructions (Signed)
Please take all new medication as prescribed  - the antibiotic  Please continue all other medications as before, and refills have been done if requested.  Please have the pharmacy call with any other refills you may need.  Please continue your efforts at being more active, low cholesterol diet, and weight control.  Please keep your appointments with your specialists as you may have planned  You will be contacted regarding the referral for: stress test

## 2015-04-02 ENCOUNTER — Ambulatory Visit (INDEPENDENT_AMBULATORY_CARE_PROVIDER_SITE_OTHER): Payer: PPO | Admitting: General Practice

## 2015-04-02 DIAGNOSIS — Z5181 Encounter for therapeutic drug level monitoring: Secondary | ICD-10-CM | POA: Diagnosis not present

## 2015-04-02 DIAGNOSIS — I2699 Other pulmonary embolism without acute cor pulmonale: Secondary | ICD-10-CM | POA: Diagnosis not present

## 2015-04-02 LAB — POCT INR: INR: 1.5

## 2015-04-02 NOTE — Progress Notes (Signed)
Pre visit review using our clinic review tool, if applicable. No additional management support is needed unless otherwise documented below in the visit note. 

## 2015-04-02 NOTE — Progress Notes (Signed)
Agree with plan 

## 2015-04-05 NOTE — Assessment & Plan Note (Signed)
For uA, exam o/w benign,  to f/u any worsening symptoms or concerns

## 2015-04-05 NOTE — Assessment & Plan Note (Signed)
stable overall by history and exam, recent data reviewed with pt, and pt to continue medical treatment as before,  to f/u any worsening symptoms or concerns Lab Results  Component Value Date   HGBA1C 6.2 09/30/2014

## 2015-04-05 NOTE — Assessment & Plan Note (Signed)
stable overall by history and exam, recent data reviewed with pt, and pt to continue medical treatment as before,  to f/u any worsening symptoms or concerns BP Readings from Last 3 Encounters:  03/27/15 128/86  11/13/14 120/84  09/30/14 171/90

## 2015-04-15 ENCOUNTER — Telehealth: Payer: Self-pay

## 2015-04-15 ENCOUNTER — Ambulatory Visit: Payer: PPO

## 2015-04-15 ENCOUNTER — Ambulatory Visit (INDEPENDENT_AMBULATORY_CARE_PROVIDER_SITE_OTHER): Payer: PPO | Admitting: General Practice

## 2015-04-15 DIAGNOSIS — Z5181 Encounter for therapeutic drug level monitoring: Secondary | ICD-10-CM | POA: Diagnosis not present

## 2015-04-15 DIAGNOSIS — I2699 Other pulmonary embolism without acute cor pulmonale: Secondary | ICD-10-CM

## 2015-04-15 LAB — POCT INR: INR: 1.7

## 2015-04-15 NOTE — Telephone Encounter (Signed)
LVM for pt to call back as soon as possible.    RE: scheduling AWV with health coach.

## 2015-04-15 NOTE — Progress Notes (Signed)
Pre visit review using our clinic review tool, if applicable. No additional management support is needed unless otherwise documented below in the visit note. 

## 2015-04-16 NOTE — Progress Notes (Signed)
Agree with plan 

## 2015-04-18 ENCOUNTER — Ambulatory Visit (INDEPENDENT_AMBULATORY_CARE_PROVIDER_SITE_OTHER): Payer: PPO | Admitting: Ophthalmology

## 2015-04-18 DIAGNOSIS — I10 Essential (primary) hypertension: Secondary | ICD-10-CM

## 2015-04-18 DIAGNOSIS — H43813 Vitreous degeneration, bilateral: Secondary | ICD-10-CM | POA: Diagnosis not present

## 2015-04-18 DIAGNOSIS — H2513 Age-related nuclear cataract, bilateral: Secondary | ICD-10-CM

## 2015-04-18 DIAGNOSIS — H33301 Unspecified retinal break, right eye: Secondary | ICD-10-CM | POA: Diagnosis not present

## 2015-04-18 DIAGNOSIS — H35033 Hypertensive retinopathy, bilateral: Secondary | ICD-10-CM | POA: Diagnosis not present

## 2015-04-22 ENCOUNTER — Ambulatory Visit (INDEPENDENT_AMBULATORY_CARE_PROVIDER_SITE_OTHER): Payer: PPO

## 2015-04-22 VITALS — BP 169/96 | HR 56 | Ht 74.0 in | Wt 277.8 lb

## 2015-04-22 DIAGNOSIS — Z0000 Encounter for general adult medical examination without abnormal findings: Secondary | ICD-10-CM

## 2015-04-22 NOTE — Progress Notes (Signed)
Subjective:   [name] is a 59 y.o. male who presents for Medicare Annual/Subsequent preventive examination.  Review of Systems:   HRA assessment completed during visit Stated he had [facility] and they came to the home.   Health better, the same or worse than last year? Better;   Health described as excellent; very good; good; fair or poor? Very good   Barriers; no barriers identified  Current Exercise: Walks his dog about one mile; go to the gym; usually go 2 to 3 times per week; uses treadmill; use of the machines; just bought an elliptical just bought at home   Insurance covers the [employer]  Diet: eat small meals; one cup q 2 to 3 hours;   Current dietary; lost 140 lbs since surgery; very successful; no longer taking insulin Metformin is the only  Stresses 1 -10; 4;   Vision had eye exam in this year: Due soon at [place] [facility]; Dr. Zigmund Daniel;  Laser surgery for "torn places in right eye".  Dental: to follow up; get insurance prior to going; Generalized Safety in the home reviewed  Falls; none in the last /  Review firearms;  Falling risk: right knee is an issue; left knee replaced from fall at work;  Right knee will need a knee replacement Depression; used to have a lot of this but is better   Gave information on safety to take home; Regarding firearms Driving; Reviewed Discussed ladders and avoiding    Current Care Team reviewed and updated        Cardiac Risk Factors include: advanced age (>14mn, >>75women);diabetes mellitus;dyslipidemia;hypertension;obesity (BMI >30kg/m2)     Objective:    Vitals: BP 169/96 mmHg  Pulse 56  Ht 6' 2" (1.88 m)  Wt 277 lb 12 oz (125.987 kg)  BMI 35.65 kg/m2  Tobacco History  Smoking status  . Former Smoker -- 0.25 packs/day for 5 years  . Quit date: 12/06/1978  Smokeless tobacco  . Never Used     Counseling given: Yes   Past Medical History  Diagnosis Date  . Diabetes mellitus     type II  .  Hyperlipidemia   . Hypertension   . DVT, lower extremity     right  . Embolus 2005    pulmonary  . Depression   . Obesity   . BACK PAIN   . COLONIC POLYPS, HX OF   . DEGENERATIVE JOINT DISEASE, KNEES, BILATERAL   . DEPRESSION   . DIVERTICULOSIS, COLON   . HYPOGONADISM, MALE   . HYPOTENSION, ORTHOSTATIC   . OBESITY, MORBID   . Long term current use of anticoagulant   . Obstructive sleep apnea     uses cpap setting 8 to 10  . Kidney stones   . Renal insufficiency   . RENAL INSUFFICIENCY    Past Surgical History  Procedure Laterality Date  . Knee surgury  2000 & 2003     cartilage damage  . Ankle surgury  2001 bilateral    with bone spurs and torn tendon  . Edg  2008    chronic Duodenitis  . Tonsillectomy and adenoidectomy  age [id number] . Total knee arthroplasty  01/10/2012    Procedure: TOTAL KNEE ARTHROPLASTY;  Surgeon: MMauri Pole MD;  Location: [facility];  Service: Orthopedics;  Laterality: Left;  . Lumbar laminectomy/decompression microdiscectomy  09/28/2012    Procedure: LUMBAR LAMINECTOMY/DECOMPRESSION MICRODISCECTOMY 2 LEVELS;  Surgeon: JMagnus Sinning MD;  Location: [facility];  Service: Orthopedics;  Laterality: Left;  Decompressive laminectomy L4-L5. Microdiscectomy L5-S1  . Heel spur surgery Bilateral 2004/01/24  . Replacement total knee  24-Jan-2012   Family History  Problem Relation Age of Onset  . Hypertension Mother   . Prostate cancer Father   . Diabetes Sister     died with DM 23-Jan-2001, 2 more sisters with diabetes  . Diabetes Brother    History  Sexual Activity  . Sexual Activity: Not Currently    Outpatient Encounter Prescriptions as of 04/22/2015  Medication Sig  . ACCU-CHEK SOFTCLIX LANCETS lancets Use as directed four times daily to check blood sugar.  Diagnosis code 250.00  . albuterol (PROAIR HFA) 108 (90 BASE) MCG/ACT inhaler Inhale 2 puffs into the lungs every 4 (four) hours as needed. For shortness of breath  . Alcohol Swabs (B-D SINGLE USE SWABS REGULAR)  PADS Use as directed  . aspirin (ECOTRIN LOW STRENGTH) 81 MG EC tablet Take 81 mg by mouth daily before breakfast.   . atorvastatin (LIPITOR) 80 MG tablet TAKE 1 TABLET (80 MG TOTAL) BY MOUTH DAILY.  Marland Kitchen Blood Glucose Monitoring Suppl (ACCU-CHEK AVIVA PLUS) W/DEVICE KIT 1 each by Other route daily. Use as directed to check blood sugar.  . escitalopram (LEXAPRO) 10 MG tablet Take 1 tablet (10 mg total) by mouth daily.  Marland Kitchen escitalopram (LEXAPRO) 10 MG tablet TAKE 1 TABLET (10 MG TOTAL) BY MOUTH DAILY.  Marland Kitchen glucose blood (ACCU-CHEK AVIVA PLUS) test strip Use as directed four times daily to check blood sugar.  Diagnosis code 250.00  . lisinopril (PRINIVIL,ZESTRIL) 5 MG tablet Take 5 mg by mouth daily.  Marland Kitchen LYRICA 50 MG capsule TAKE 1 CAPSULE BY MOUTH TWICE DAILY  . metFORMIN (GLUCOPHAGE) 500 MG tablet Take 1 tablet (500 mg total) by mouth 2 (two) times daily with a meal.  . mupirocin ointment (BACTROBAN) 2 % Applied twice a day to the affected area;NOT into eyes.  Marland Kitchen sennosides-docusate sodium (SENOKOT-S) 8.6-50 MG tablet Take 1 tablet by mouth.  . warfarin (COUMADIN) 5 MG tablet Take as directed by anticoagulation clinic  . [DISCONTINUED] atorvastatin (LIPITOR) 80 MG tablet Take 80 mg by mouth daily.  . [DISCONTINUED] meloxicam (MOBIC) 7.5 MG tablet Take 2 tablets (15 mg total) by mouth daily.  . budesonide-formoterol (SYMBICORT) 80-4.5 MCG/ACT inhaler Inhale 2 puffs into the lungs 2 (two) times daily. (Patient not taking: Reported on 04/22/2015)  . ciprofloxacin (CIPRO) 500 MG tablet Take 1 tablet (500 mg total) by mouth 2 (two) times daily.  Marland Kitchen enoxaparin (LOVENOX) 150 MG/ML injection Inject 1 mL (150 mg total) into the skin every 12 (twelve) hours. (Patient not taking: Reported on 03/27/2015)  . [DISCONTINUED] phentermine (ADIPEX-P) 37.5 MG tablet Take 37.5 mg by mouth daily before breakfast.   No facility-administered encounter medications on file as of 04/22/2015.    Activities of Daily Living In your  present state of health, do you have any difficulty performing the following activities: 04/22/2015  Hearing? N  Vision? N  Difficulty concentrating or making decisions? N  Walking or climbing stairs? N  Dressing or bathing? N  Doing errands, shopping? N  Preparing Food and eating ? N  Using the Toilet? N  In the past six months, have you accidently leaked urine? N  Do you have problems with loss of bowel control? N  Managing your Medications? N  Managing your Finances? N  Housekeeping or managing your Housekeeping? N    Patient Care Team: Biagio Borg, MD as PCP -  General Suella Broad, MD as Consulting Physician (Physical Medicine and Rehabilitation) Eldridge Abrahams, MD as Referring Physician (Surgical Oncology) Renato Shin, MD as Consulting Physician (Endocrinology)   Assessment:    Objective:  VS: with normal range BMI: 35.7; lost 140 lbs since Gastric bypass in August of 2015 Understand risk and lifestyle choices than can impede health: (CVD; Metabolic syndrome; functional decline;   Personalized Education given regarding: Exercise and recent back injury from bending over to pick up a toy off the floor x 1 month ago;  followed by dr. Nelva Bush and in PT this week/will review for back strengthening at discharge Pt determined a personalized goal Wants to play golf again; [facility]  Assessment included: smoking (secondary) educated as appropriate; including LDCT if appropriate/ very limited smoking hx Bone density scan as appropriate/ na/ Calcium and Vit D as appropriate/ Osteoporosis risk reviewed/na Taking meds without issues; no barriers identified Stress: Recommendations for managing stress if assessed as a factor;  No Risk for hepatitis or high risk social behavior identified via hepatitis screen Educated on shingles and follow up with insurance company for co-pays or charges applied to Part D benefit.  Safety issues  reviewed(driving issues; vision; home environment; support and environmental safety)  Cognition assessed by AD8; Score 0 (A score of 2 or greater would indicate the MMSE be completed)    Need for Immunizations or other screenings identified;  (CDC recommmend Prevnar at 65 followed by pnuemovax 23 in one year or 5 years after the last dose. Per Dr. Jenny Reichmann, does not need repeat of pneumovax at this time. Will postpone in o/d HM  Health Maintenance up to date and a Preventive Wellness Plan was given to the patient    Exercise Activities and Dietary recommendations Current Exercise Habits:: Structured exercise class;Exercise is limited by (exercise limited by back pain), Type of exercise: strength training/weights;treadmill (walks his dog), Time (Minutes): 60, Frequency (Times/Week): 3, Weekly Exercise (Minutes/Week): 180, Intensity: Moderate  Goals    . Exercise 3x per week (30 min per time)     Hopes to stay active; will focus on keeping back strong; to fup with PT regarding exercises recommended to keep the back strong. Golf for fun       Fall Risk Fall Risk  04/22/2015 03/13/2014  Falls in the past year? No No   Depression Screen PHQ 2/9 Scores 04/22/2015 03/13/2014 10/10/2013  PHQ - 2 Score 0 0 1    Cognitive Testing No flowsheet data found.  Immunization History  Administered Date(s) Administered  . Influenza Split 08/19/2011, 10/30/2012  . Influenza Whole 09/21/2005, 09/02/2008, 12/24/2009  . Influenza,inj,Quad PF,36+ Mos 09/13/2013, 09/30/2014  . Pneumococcal Conjugate-13 03/13/2014  . Pneumococcal Polysaccharide-23 03/04/2008  . Td 03/11/2009   Screening Tests Health Maintenance  Topic Date Due  . ZOSTAVAX  12/18/2013  . OPHTHALMOLOGY EXAM  11/20/2014  . HEMOGLOBIN A1C  04/01/2015  . PNEUMOCOCCAL POLYSACCHARIDE VACCINE (2) 04/21/2016 (Originally 03/04/2013)  . HIV Screening  04/21/2016 (Originally 12/18/1970)  . URINE MICROALBUMIN  06/01/2015  . INFLUENZA VACCINE   07/07/2015  . FOOT EXAM  10/01/2015  . TETANUS/TDAP  03/12/2019  . COLONOSCOPY  03/18/2020      Plan:    Plan   The patient agrees to:h 1. Scheduled stress test ordered at last OV today; apt for 5/31 at 7:45 am at [facility] [address] and will fup 2. Agrees to make vision apt with Dr. Bing Plume. 3. Will commit to  exercise goals; including ellipitcal; golf and continued [employer]  4. Educated regarding Pneumonia vaccine; will post-pone until 86;   Advanced directive: Given information to digest and referred to [facility] for more information on AD; in lieu of faith practices and culture;  Discuss with Doctor on next fup: Stated he bent over and hurt back; Seeing Dr. Nelva Bush; Has started PT    During the course of the visit the patient was educated and counseled about the following appropriate screening and preventive services:   Vaccines to include Pneumoccal, Influenza, Hepatitis B, Td, Zostavax, HCV/   Educated on shingles and to fup with insurance  Electrocardiogram/ completed  Cardiovascular Disease/ referred for stress test and apt scheduled today  Colorectal cancer screening; completed  Diabetes screening: A1c down and will repeat in October of this year or per MD order  Prostate Cancer Screening; deferred to Dr. Jenny Reichmann  Glaucoma screening: to be scheduled by June  Nutrition counseling / compliant with Bypass diet Smoking cessation counseling/ n/a not smoking and limited smoking hx Patient Instructions (the written plan) was given to the patient.    Wynetta Fines, RN  04/22/2015   Medical screening examination/treatment/procedure(s) were performed by non-physician practitioner and as supervising physician I was immediately available for consultation/collaboration. I agree with above. Cathlean Cower, MD

## 2015-04-22 NOTE — Patient Instructions (Addendum)
[name] , Thank you for taking time to come for your Medicare Wellness Visit. I appreciate your ongoing commitment to your health goals. Please review the following plan we discussed and let me know if I can assist you in the future.   These are the goals we discussed: Goals    None      This is a list of the screening recommended for you and due dates:  Health Maintenance  Topic Date Due  . Shingles Vaccine  12/18/2013  . Eye exam for diabetics  11/20/2014  . Hemoglobin A1C  04/01/2015  . Pneumococcal vaccine (2) 04/21/2016*  . HIV Screening  04/21/2016*  . Urine Protein Check  06/01/2015  . Flu Shot  07/07/2015  . Complete foot exam   10/01/2015  . Tetanus Vaccine  03/12/2019  . Colon Cancer Screening  03/18/2020  *Topic was postponed. The date shown is not the original due date.    Goals determined but did not transfer over: Includes goal to exercise back when back pain ceases; in PT now under Dr. Nelva Bush Also will continue at [facility]; 3 times a week; Just purchased elliptical at home Goal; will discuss with PT  1. Patient was scheduled while at visit for stress test ordered by Dr. Sander Radon test is at 5/31 at 7: 45am at [place] , [address]/ will review with Dr. Nelva Bush 2. Will call to get apt for eye exam (Dr. Bing Plume)  3. Gave information regarding [facility] to attend computer classes.     Fall Prevention and Home Safety Falls cause injuries and can affect all age groups. It is possible to prevent falls.  HOW TO PREVENT FALLS  Wear shoes with rubber soles that do not have an opening for your toes.  Keep the inside and outside of your house well lit.  Use night lights throughout your home.  Remove clutter from floors.  Clean up floor spills.  Remove throw rugs or fasten them to the floor with carpet tape.  Do not place electrical cords across pathways.  Put grab bars by your tub, shower, and toilet. Do not use towel bars as grab bars.  Put handrails  on both sides of the stairway. Fix loose handrails.  Do not climb on stools or stepladders, if possible.  Do not wax your floors.  Repair uneven or unsafe sidewalks, walkways, or stairs.  Keep items you use a lot within reach.  Be aware of pets.  Keep emergency numbers next to the telephone.  Put smoke detectors in your home and near bedrooms. Ask your doctor what other things you can do to prevent falls. Document Released: 09/18/2009 Document Revised: 05/23/2012 Document Reviewed: 02/22/2012 [facility] Patient Information 2015 [place], [place]. This information is not intended to replace advice given to you by your health care provider. Make sure you discuss any questions you have with your health care provider.  Health Maintenance A healthy lifestyle and preventative care can promote health and wellness.  Maintain regular health, dental, and eye exams.  Eat a healthy diet. Foods like vegetables, fruits, whole grains, low-fat dairy products, and lean protein foods contain the nutrients you need and are low in calories. Decrease your intake of foods high in solid fats, added sugars, and salt. Get information about a proper diet from your health care provider, if necessary.  Regular physical exercise is one of the most important things you can do for your health. Most adults should get at least 150 minutes of  moderate-intensity exercise (any activity that increases your heart rate and causes you to sweat) each week. In addition, most adults need muscle-strengthening exercises on 2 or more days a week.   Maintain a healthy weight. The body mass index (BMI) is a screening tool to identify possible weight problems. It provides an estimate of body fat based on height and weight. Your health care provider can find your BMI and can help you achieve or maintain a healthy weight. For males 20 years and older:  A BMI below 18.5 is considered underweight.  A BMI of 18.5 to 24.9 is normal.  A BMI  of 25 to 29.9 is considered overweight.  A BMI of 30 and above is considered obese.  Maintain normal blood lipids and cholesterol by exercising and minimizing your intake of saturated fat. Eat a balanced diet with plenty of fruits and vegetables. Blood tests for lipids and cholesterol should begin at age 20 and be repeated every 5 years. If your lipid or cholesterol levels are high, you are over age 36, or you are at high risk for heart disease, you may need your cholesterol levels checked more frequently.Ongoing high lipid and cholesterol levels should be treated with medicines if diet and exercise are not working.  If you smoke, find out from your health care provider how to quit. If you do not use tobacco, do not start.  Lung cancer screening is recommended for adults aged 25-80 years who are at high risk for developing lung cancer because of a history of smoking. A yearly low-dose CT scan of the lungs is recommended for people who have at least a 30-pack-year history of smoking and are current smokers or have quit within the past 15 years. A pack year of smoking is smoking an average of 1 pack of cigarettes a day for 1 year (for example, a 30-pack-year history of smoking could mean smoking 1 pack a day for 30 years or 2 packs a day for 15 years). Yearly screening should continue until the smoker has stopped smoking for at least 15 years. Yearly screening should be stopped for people who develop a health problem that would prevent them from having lung cancer treatment.  If you choose to drink alcohol, do not have more than 2 drinks per day. One drink is considered to be 12 oz (360 mL) of beer, 5 oz (150 mL) of wine, or 1.5 oz (45 mL) of liquor.  Avoid the use of street drugs. Do not share needles with anyone. Ask for help if you need support or instructions about stopping the use of drugs.  High blood pressure causes heart disease and increases the risk of stroke. Blood pressure should be checked  at least every 1-2 years. Ongoing high blood pressure should be treated with medicines if weight loss and exercise are not effective.  If you are 73-41 years old, ask your health care provider if you should take aspirin to prevent heart disease.  Diabetes screening involves taking a blood sample to check your fasting blood sugar level. This should be done once every 3 years after age 55 if you are at a normal weight and without risk factors for diabetes. Testing should be considered at a younger age or be carried out more frequently if you are overweight and have at least 1 risk factor for diabetes.  Colorectal cancer can be detected and often prevented. Most routine colorectal cancer screening begins at the age of 57 and continues through age 26. However,  your health care provider may recommend screening at an earlier age if you have risk factors for colon cancer. On a yearly basis, your health care provider may provide home test kits to check for hidden blood in the stool. A small camera at the end of a tube may be used to directly examine the colon (sigmoidoscopy or colonoscopy) to detect the earliest forms of colorectal cancer. Talk to your health care provider about this at age 20 when routine screening begins. A direct exam of the colon should be repeated every 5-10 years through age 37, unless early forms of precancerous polyps or small growths are found.  People who are at an increased risk for hepatitis B should be screened for this virus. You are considered at high risk for hepatitis B if:  You were born in a country where hepatitis B occurs often. Talk with your health care provider about which countries are considered high risk.  Your parents were born in a high-risk country and you have not received a shot to protect against hepatitis B (hepatitis B vaccine).  You have HIV or AIDS.  You use needles to inject street drugs.  You live with, or have sex with, someone who has hepatitis  B.  You are a man who has sex with other men (MSM).  You get hemodialysis treatment.  You take certain medicines for conditions like cancer, organ transplantation, and autoimmune conditions.  Hepatitis C blood testing is recommended for all people born from [birth year] through 1967 and any individual with known risk factors for hepatitis C.  Healthy men should no longer receive prostate-specific antigen (PSA) blood tests as part of routine cancer screening. Talk to your health care provider about prostate cancer screening.  Testicular cancer screening is not recommended for adolescents or adult males who have no symptoms. Screening includes self-exam, a health care provider exam, and other screening tests. Consult with your health care provider about any symptoms you have or any concerns you have about testicular cancer.  Practice safe sex. Use condoms and avoid high-risk sexual practices to reduce the spread of sexually transmitted infections (STIs).  You should be screened for STIs, including gonorrhea and chlamydia if:  You are sexually active and are younger than 24 years.  You are older than 24 years, and your health care provider tells you that you are at risk for this type of infection.  Your sexual activity has changed since you were last screened, and you are at an increased risk for chlamydia or gonorrhea. Ask your health care provider if you are at risk.  If you are at risk of being infected with HIV, it is recommended that you take a prescription medicine daily to prevent HIV infection. This is called pre-exposure prophylaxis (PrEP). You are considered at risk if:  You are a man who has sex with other men (MSM).  You are a heterosexual man who is sexually active with multiple partners.  You take drugs by injection.  You are sexually active with a partner who has HIV.  Talk with your health care provider about whether you are at high risk of being infected with HIV. If you  choose to begin PrEP, you should first be tested for HIV. You should then be tested every 3 months for as long as you are taking PrEP.  Use sunscreen. Apply sunscreen liberally and repeatedly throughout the day. You should seek shade when your shadow is shorter than you. Protect yourself by wearing long sleeves, pants,  a wide-brimmed hat, and sunglasses year round whenever you are outdoors.  Tell your health care provider of new moles or changes in moles, especially if there is a change in shape or color. Also, tell your health care provider if a mole is larger than the size of a pencil eraser.  A one-time screening for abdominal aortic aneurysm (AAA) and surgical repair of large AAAs by ultrasound is recommended for men aged 54-75 years who are current or former smokers.  Stay current with your vaccines (immunizations). Document Released: 05/20/2008 Document Revised: 11/27/2013 Document Reviewed: 04/19/2011 [facility] Patient Information 2015 [place], [place]. This information is not intended to replace advice given to you by your health care provider. Make sure you discuss any questions you have with your health care provider. p[p

## 2015-04-24 ENCOUNTER — Ambulatory Visit (INDEPENDENT_AMBULATORY_CARE_PROVIDER_SITE_OTHER): Payer: PPO | Admitting: Endocrinology

## 2015-04-24 ENCOUNTER — Encounter: Payer: Self-pay | Admitting: Endocrinology

## 2015-04-24 VITALS — BP 134/88 | HR 63 | Temp 98.2°F | Ht 74.0 in | Wt 279.0 lb

## 2015-04-24 DIAGNOSIS — E119 Type 2 diabetes mellitus without complications: Secondary | ICD-10-CM | POA: Diagnosis not present

## 2015-04-24 LAB — HEMOGLOBIN A1C: Hgb A1c MFr Bld: 6.3 % (ref 4.6–6.5)

## 2015-04-24 MED ORDER — METFORMIN HCL 500 MG PO TABS: 500.0000 mg | ORAL_TABLET | Freq: Every day | ORAL | Status: DC

## 2015-04-24 NOTE — Patient Instructions (Signed)
blood tests are requested for you today.  We'll let you know about the results. Based on the result, we may be able to stop the metformin.  Please come back for a follow-up appointment in 3 months

## 2015-04-24 NOTE — Progress Notes (Signed)
Subjective:    Patient ID: [name], male    DOB: 01-24-1956, 59 y.o.   MRN: [id number]  HPI Pt returns for f/u of diabetes mellitus: DM type: Insulin-requiring type 2 Dx'ed: 9326 Complications: renal insufficiency and autonomic neuropathy Therapy: insulin 2004-2015; now metformin only DKA: never Severe hypoglycemia: never Pancreatitis: never Other: He has gastric bypass surgery at [facility], in 2015; Since then, he has lost 150 lbs, and went off insulin. Interval history: no cbg record, but states cbg's are well-controlled.  pt states he feels well in general.   Past Medical History  Diagnosis Date  . Diabetes mellitus     type II  . Hyperlipidemia   . Hypertension   . DVT, lower extremity     right  . Embolus 2005    pulmonary  . Depression   . Obesity   . BACK PAIN   . COLONIC POLYPS, HX OF   . DEGENERATIVE JOINT DISEASE, KNEES, BILATERAL   . DEPRESSION   . DIVERTICULOSIS, COLON   . HYPOGONADISM, MALE   . HYPOTENSION, ORTHOSTATIC   . OBESITY, MORBID   . Long term current use of anticoagulant   . Obstructive sleep apnea     uses cpap setting 8 to 10  . Kidney stones   . Renal insufficiency   . RENAL INSUFFICIENCY     Past Surgical History  Procedure Laterality Date  . Knee surgury  2000 & 2003     cartilage damage  . Ankle surgury  2001 bilateral    with bone spurs and torn tendon  . Edg  2008    chronic Duodenitis  . Tonsillectomy and adenoidectomy  age 2 or 85  . Total knee arthroplasty  01/10/2012    Procedure: TOTAL KNEE ARTHROPLASTY;  Surgeon: [name], MD;  Location: [facility];  Service: Orthopedics;  Laterality: Left;  . Lumbar laminectomy/decompression microdiscectomy  09/28/2012    Procedure: LUMBAR LAMINECTOMY/DECOMPRESSION MICRODISCECTOMY 2 LEVELS;  Surgeon: [name], MD;  Location: [facility];  Service: Orthopedics;  Laterality: Left;  Decompressive laminectomy L4-L5. Microdiscectomy L5-S1  . Heel spur surgery Bilateral 2005  .  Replacement total knee  2013    History   Social History  . Marital Status: Single    Spouse Name: N/A  . Number of Children: 2  . Years of Education: N/A   Occupational History  . back at work at [employer] since Jan. 2010    Social History Main Topics  . Smoking status: Former Smoker -- 0.25 packs/day for 5 years    Quit date: 12/06/1978  . Smokeless tobacco: Never Used  . Alcohol Use: 0.0 oz/week    0 Standard drinks or equivalent per week     Comment: occasional  . Drug Use: No  . Sexual Activity: Not Currently   Other Topics Concern  . Not on file   Social History Narrative   Daily caffeine use one per day   Protein drinks    Live alone   Children; a friend and sister that helps   Brother that is around Legrand [place])   HAVE A SON AND DTR IN [place]    Current Outpatient Prescriptions on File Prior to Visit  Medication Sig Dispense Refill  . ACCU-CHEK SOFTCLIX LANCETS lancets Use as directed four times daily to check blood sugar.  Diagnosis code 250.00 300 each 3  . albuterol (PROAIR HFA) 108 (90 BASE) MCG/ACT inhaler Inhale 2 puffs into the lungs every 4 (four)  hours as needed. For shortness of breath 1 Inhaler 11  . Alcohol Swabs (B-D SINGLE USE SWABS REGULAR) PADS Use as directed 200 each 3  . aspirin (ECOTRIN LOW STRENGTH) 81 MG EC tablet Take 81 mg by mouth daily before breakfast.     . atorvastatin (LIPITOR) 80 MG tablet TAKE 1 TABLET (80 MG TOTAL) BY MOUTH DAILY. 90 tablet 1  . Blood Glucose Monitoring Suppl (ACCU-CHEK AVIVA PLUS) W/DEVICE KIT 1 each by Other route daily. Use as directed to check blood sugar. 1 kit 0  . budesonide-formoterol (SYMBICORT) 80-4.5 MCG/ACT inhaler Inhale 2 puffs into the lungs 2 (two) times daily. 1 Inhaler 3  . ciprofloxacin (CIPRO) 500 MG tablet Take 1 tablet (500 mg total) by mouth 2 (two) times daily. 20 tablet 0  . enoxaparin (LOVENOX) 150 MG/ML injection Inject 1 mL (150 mg total) into the skin every 12 (twelve)  hours. 2 Syringe 0  . escitalopram (LEXAPRO) 10 MG tablet Take 1 tablet (10 mg total) by mouth daily. 90 tablet 1  . escitalopram (LEXAPRO) 10 MG tablet TAKE 1 TABLET (10 MG TOTAL) BY MOUTH DAILY. 90 tablet 3  . glucose blood (ACCU-CHEK AVIVA PLUS) test strip Use as directed four times daily to check blood sugar.  Diagnosis code 250.00 300 each 3  . lisinopril (PRINIVIL,ZESTRIL) 5 MG tablet Take 5 mg by mouth daily.    Marland Kitchen LYRICA 50 MG capsule TAKE 1 CAPSULE BY MOUTH TWICE DAILY 60 capsule 5  . mupirocin ointment (BACTROBAN) 2 % Applied twice a day to the affected area;NOT into eyes. 22 g 0  . sennosides-docusate sodium (SENOKOT-S) 8.6-50 MG tablet Take 1 tablet by mouth.    . warfarin (COUMADIN) 5 MG tablet Take as directed by anticoagulation clinic 180 tablet 1   No current facility-administered medications on file prior to visit.    Allergies  Allergen Reactions  . Other     NO BLOOD -JEHOVAH'S WITNESS-SIGNED REFUSAL BUT WOULD TAKE ALBUMIN    Family History  Problem Relation Age of Onset  . Hypertension Mother   . Prostate cancer Father   . Diabetes Sister     died with DM 02/18/01, 2 more sisters with diabetes  . Diabetes Brother     BP 134/88 mmHg  Pulse 63  Temp(Src) 98.2 F (36.8 C) (Oral)  Ht '6\' 2"'  (1.88 m)  Wt 279 lb (126.554 kg)  BMI 35.81 kg/m2  SpO2 96%  Review of Systems Denies n/v    Objective:   Physical Exam VITAL SIGNS:  See vs page GENERAL: no distress Pulses: dorsalis pedis intact bilat.   MSK: no deformity of the feet CV: no leg edema Skin:  no ulcer on the feet.  normal color and temp on the feet. Neuro: sensation is intact to touch on the feet  Past Medical History  Diagnosis Date  . Diabetes mellitus     type II  . Hyperlipidemia   . Hypertension   . DVT, lower extremity     right  . Embolus 2004/02/19    pulmonary  . Depression   . Obesity   . BACK PAIN   . COLONIC POLYPS, HX OF   . DEGENERATIVE JOINT DISEASE, KNEES, BILATERAL   .  DEPRESSION   . DIVERTICULOSIS, COLON   . HYPOGONADISM, MALE   . HYPOTENSION, ORTHOSTATIC   . OBESITY, MORBID   . Long term current use of anticoagulant   . Obstructive sleep apnea     uses cpap setting 8  to 10  . Kidney stones   . Renal insufficiency   . RENAL INSUFFICIENCY     Past Surgical History  Procedure Laterality Date  . Knee surgury  2000 & 2003     cartilage damage  . Ankle surgury  2001 bilateral    with bone spurs and torn tendon  . Edg  2008    chronic Duodenitis  . Tonsillectomy and adenoidectomy  age 47 or 24  . Total knee arthroplasty  01/10/2012    Procedure: TOTAL KNEE ARTHROPLASTY;  Surgeon: [name], MD;  Location: [facility];  Service: Orthopedics;  Laterality: Left;  . Lumbar laminectomy/decompression microdiscectomy  09/28/2012    Procedure: LUMBAR LAMINECTOMY/DECOMPRESSION MICRODISCECTOMY 2 LEVELS;  Surgeon: [name], MD;  Location: [facility];  Service: Orthopedics;  Laterality: Left;  Decompressive laminectomy L4-L5. Microdiscectomy L5-S1  . Heel spur surgery Bilateral 2005  . Replacement total knee  2013    History   Social History  . Marital Status: Single    Spouse Name: N/A  . Number of Children: 2  . Years of Education: N/A   Occupational History  . back at work at [employer] since Jan. 2010    Social History Main Topics  . Smoking status: Former Smoker -- 0.25 packs/day for 5 years    Quit date: 12/06/1978  . Smokeless tobacco: Never Used  . Alcohol Use: 0.0 oz/week    0 Standard drinks or equivalent per week     Comment: occasional  . Drug Use: No  . Sexual Activity: Not Currently   Other Topics Concern  . Not on file   Social History Narrative   Daily caffeine use one per day   Protein drinks    Live alone   Children; a friend and sister that helps   Brother that is around Legrand [place])   HAVE A SON AND DTR IN [place]    Current Outpatient Prescriptions on File Prior to Visit  Medication Sig Dispense Refill   . ACCU-CHEK SOFTCLIX LANCETS lancets Use as directed four times daily to check blood sugar.  Diagnosis code 250.00 300 each 3  . albuterol (PROAIR HFA) 108 (90 BASE) MCG/ACT inhaler Inhale 2 puffs into the lungs every 4 (four) hours as needed. For shortness of breath 1 Inhaler 11  . Alcohol Swabs (B-D SINGLE USE SWABS REGULAR) PADS Use as directed 200 each 3  . aspirin (ECOTRIN LOW STRENGTH) 81 MG EC tablet Take 81 mg by mouth daily before breakfast.     . atorvastatin (LIPITOR) 80 MG tablet TAKE 1 TABLET (80 MG TOTAL) BY MOUTH DAILY. 90 tablet 1  . Blood Glucose Monitoring Suppl (ACCU-CHEK AVIVA PLUS) W/DEVICE KIT 1 each by Other route daily. Use as directed to check blood sugar. 1 kit 0  . budesonide-formoterol (SYMBICORT) 80-4.5 MCG/ACT inhaler Inhale 2 puffs into the lungs 2 (two) times daily. 1 Inhaler 3  . ciprofloxacin (CIPRO) 500 MG tablet Take 1 tablet (500 mg total) by mouth 2 (two) times daily. 20 tablet 0  . enoxaparin (LOVENOX) 150 MG/ML injection Inject 1 mL (150 mg total) into the skin every 12 (twelve) hours. 2 Syringe 0  . escitalopram (LEXAPRO) 10 MG tablet Take 1 tablet (10 mg total) by mouth daily. 90 tablet 1  . escitalopram (LEXAPRO) 10 MG tablet TAKE 1 TABLET (10 MG TOTAL) BY MOUTH DAILY. 90 tablet 3  . glucose blood (ACCU-CHEK AVIVA PLUS) test strip Use as directed four times daily to check blood  sugar.  Diagnosis code 250.00 300 each 3  . lisinopril (PRINIVIL,ZESTRIL) 5 MG tablet Take 5 mg by mouth daily.    Marland Kitchen LYRICA 50 MG capsule TAKE 1 CAPSULE BY MOUTH TWICE DAILY 60 capsule 5  . mupirocin ointment (BACTROBAN) 2 % Applied twice a day to the affected area;NOT into eyes. 22 g 0  . sennosides-docusate sodium (SENOKOT-S) 8.6-50 MG tablet Take 1 tablet by mouth.    . warfarin (COUMADIN) 5 MG tablet Take as directed by anticoagulation clinic 180 tablet 1   No current facility-administered medications on file prior to visit.    Allergies  Allergen Reactions  . Other      NO BLOOD -JEHOVAH'S WITNESS-SIGNED REFUSAL BUT WOULD TAKE ALBUMIN    Family History  Problem Relation Age of Onset  . Hypertension Mother   . Prostate cancer Father   . Diabetes Sister     died with DM 02/12/01, 2 more sisters with diabetes  . Diabetes Brother     BP 134/88 mmHg  Pulse 63  Temp(Src) 98.2 F (36.8 C) (Oral)  Ht '6\' 2"'  (1.88 m)  Wt 279 lb (126.554 kg)  BMI 35.81 kg/m2  SpO2 96%     Assessment & Plan:  DM: well-controlled  Patient is advised the following: Patient Instructions  blood tests are requested for you today.  We'll let you know about the results. Based on the result, we may be able to stop the metformin.  Please come back for a follow-up appointment in 3 months  addendum: reduce the metformin to 500 mg qd.

## 2015-05-01 ENCOUNTER — Telehealth (HOSPITAL_COMMUNITY): Payer: Self-pay | Admitting: *Deleted

## 2015-05-01 NOTE — Telephone Encounter (Signed)
Left message on voicemail in reference to upcoming appointment scheduled for 05/06/15. Phone number given for a call back so details instructions can be given. Hubbard Robinson, RN

## 2015-05-06 ENCOUNTER — Ambulatory Visit (INDEPENDENT_AMBULATORY_CARE_PROVIDER_SITE_OTHER): Payer: PPO | Admitting: General Practice

## 2015-05-06 ENCOUNTER — Ambulatory Visit (HOSPITAL_COMMUNITY): Payer: PPO | Attending: Cardiovascular Disease

## 2015-05-06 DIAGNOSIS — I2699 Other pulmonary embolism without acute cor pulmonale: Secondary | ICD-10-CM

## 2015-05-06 DIAGNOSIS — R079 Chest pain, unspecified: Secondary | ICD-10-CM | POA: Diagnosis present

## 2015-05-06 DIAGNOSIS — Z5181 Encounter for therapeutic drug level monitoring: Secondary | ICD-10-CM

## 2015-05-06 LAB — MYOCARDIAL PERFUSION IMAGING
CHL CUP STRESS STAGE 1 HR: 57 {beats}/min
CHL CUP STRESS STAGE 2 SPEED: 0 mph
CHL CUP STRESS STAGE 3 GRADE: 0 %
CHL CUP STRESS STAGE 4 DBP: 92 mmHg
CHL CUP STRESS STAGE 4 SBP: 143 mmHg
CHL CUP STRESS STAGE 6 GRADE: 14 %
CHL CUP STRESS STAGE 6 HR: 139 {beats}/min
CHL CUP STRESS STAGE 7 GRADE: 14 %
CHL CUP STRESS STAGE 8 DBP: 94 mmHg
CHL CUP STRESS STAGE 8 SPEED: 0 mph
CHL CUP STRESS STAGE 9 HR: 61 {beats}/min
CHL CUP STRESS STAGE 9 SPEED: 0 mph
CSEPED: 8 min
CSEPHR: 93 %
Estimated workload: 9.2 METS
Exercise duration (sec): 0 s
LV sys vol: 79 mL
LVDIAVOL: 157 mL
MPHR: 159 {beats}/min
NUC STRESS TID: 1.11
Nuc Stress EF: 49 %
Peak HR: 148 {beats}/min
Percent of predicted max HR: 93 %
RATE: 0.28
Rest HR: 42 {beats}/min
SDS: 1
SRS: 5
SSS: 6
Stage 1 Grade: 0 %
Stage 1 Speed: 0 mph
Stage 2 DBP: 90 mmHg
Stage 2 Grade: 0 %
Stage 2 HR: 51 {beats}/min
Stage 2 SBP: 152 mmHg
Stage 3 HR: 50 {beats}/min
Stage 3 Speed: 0 mph
Stage 4 Grade: 10 %
Stage 4 HR: 105 {beats}/min
Stage 4 Speed: 1.7 mph
Stage 5 DBP: 94 mmHg
Stage 5 Grade: 12 %
Stage 5 HR: 133 {beats}/min
Stage 5 SBP: 161 mmHg
Stage 5 Speed: 2.5 mph
Stage 6 Speed: 3.3 mph
Stage 7 HR: 148 {beats}/min
Stage 7 Speed: 3.3 mph
Stage 8 Grade: 0 %
Stage 8 HR: 121 {beats}/min
Stage 8 SBP: 202 mmHg
Stage 9 Grade: 0 %

## 2015-05-06 LAB — POCT INR: INR: 2.2

## 2015-05-06 MED ORDER — TECHNETIUM TC 99M SESTAMIBI GENERIC - CARDIOLITE
33.0000 | Freq: Once | INTRAVENOUS | Status: AC | PRN
  Administered 2015-05-06: 33 via INTRAVENOUS

## 2015-05-06 MED ORDER — TECHNETIUM TC 99M SESTAMIBI GENERIC - CARDIOLITE
11.0000 | Freq: Once | INTRAVENOUS | Status: AC | PRN
  Administered 2015-05-06: 11 via INTRAVENOUS

## 2015-05-06 NOTE — Progress Notes (Signed)
Pre visit review using our clinic review tool, if applicable. No additional management support is needed unless otherwise documented below in the visit note. 

## 2015-05-06 NOTE — Progress Notes (Signed)
I have reviewed and agree with the plan. 

## 2015-05-07 ENCOUNTER — Encounter: Payer: Self-pay | Admitting: Cardiology

## 2015-05-08 ENCOUNTER — Other Ambulatory Visit: Payer: Self-pay | Admitting: Internal Medicine

## 2015-05-08 DIAGNOSIS — R9439 Abnormal result of other cardiovascular function study: Secondary | ICD-10-CM

## 2015-05-16 ENCOUNTER — Emergency Department (HOSPITAL_COMMUNITY)
Admission: EM | Admit: 2015-05-16 | Discharge: 2015-05-17 | Disposition: A | Discharge status: Home / Self Care | Payer: PPO | Attending: Emergency Medicine | Admitting: Emergency Medicine

## 2015-05-16 ENCOUNTER — Encounter (HOSPITAL_COMMUNITY): Payer: Self-pay | Admitting: Emergency Medicine

## 2015-05-16 ENCOUNTER — Emergency Department (HOSPITAL_COMMUNITY): Payer: PPO

## 2015-05-16 DIAGNOSIS — Z86718 Personal history of other venous thrombosis and embolism: Secondary | ICD-10-CM | POA: Diagnosis not present

## 2015-05-16 DIAGNOSIS — Z86010 Personal history of colonic polyps: Secondary | ICD-10-CM | POA: Insufficient documentation

## 2015-05-16 DIAGNOSIS — Z7951 Long term (current) use of inhaled steroids: Secondary | ICD-10-CM | POA: Insufficient documentation

## 2015-05-16 DIAGNOSIS — R079 Chest pain, unspecified: Secondary | ICD-10-CM | POA: Diagnosis not present

## 2015-05-16 DIAGNOSIS — Z87891 Personal history of nicotine dependence: Secondary | ICD-10-CM | POA: Insufficient documentation

## 2015-05-16 DIAGNOSIS — Z87442 Personal history of urinary calculi: Secondary | ICD-10-CM | POA: Insufficient documentation

## 2015-05-16 DIAGNOSIS — Z9981 Dependence on supplemental oxygen: Secondary | ICD-10-CM | POA: Diagnosis not present

## 2015-05-16 DIAGNOSIS — F329 Major depressive disorder, single episode, unspecified: Secondary | ICD-10-CM | POA: Insufficient documentation

## 2015-05-16 DIAGNOSIS — R1013 Epigastric pain: Secondary | ICD-10-CM | POA: Insufficient documentation

## 2015-05-16 DIAGNOSIS — Z7901 Long term (current) use of anticoagulants: Secondary | ICD-10-CM | POA: Insufficient documentation

## 2015-05-16 DIAGNOSIS — Z86711 Personal history of pulmonary embolism: Secondary | ICD-10-CM | POA: Diagnosis not present

## 2015-05-16 DIAGNOSIS — I10 Essential (primary) hypertension: Secondary | ICD-10-CM | POA: Insufficient documentation

## 2015-05-16 DIAGNOSIS — E119 Type 2 diabetes mellitus without complications: Secondary | ICD-10-CM | POA: Diagnosis not present

## 2015-05-16 DIAGNOSIS — E785 Hyperlipidemia, unspecified: Secondary | ICD-10-CM | POA: Diagnosis not present

## 2015-05-16 DIAGNOSIS — G4733 Obstructive sleep apnea (adult) (pediatric): Secondary | ICD-10-CM | POA: Diagnosis not present

## 2015-05-16 DIAGNOSIS — R1010 Upper abdominal pain, unspecified: Secondary | ICD-10-CM

## 2015-05-16 DIAGNOSIS — Z79899 Other long term (current) drug therapy: Secondary | ICD-10-CM | POA: Diagnosis not present

## 2015-05-16 DIAGNOSIS — Z8739 Personal history of other diseases of the musculoskeletal system and connective tissue: Secondary | ICD-10-CM | POA: Insufficient documentation

## 2015-05-16 LAB — BASIC METABOLIC PANEL
Anion gap: 10 (ref 5–15)
BUN: 12 mg/dL (ref 6–20)
CALCIUM: 9.2 mg/dL (ref 8.9–10.3)
CHLORIDE: 105 mmol/L (ref 101–111)
CO2: 24 mmol/L (ref 22–32)
Creatinine, Ser: 0.97 mg/dL (ref 0.61–1.24)
GFR calc non Af Amer: >60 mL/min (ref >60)
Glucose, Bld: 147 mg/dL — ABNORMAL HIGH (ref 65–99)
POTASSIUM: 4.6 mmol/L (ref 3.5–5.1)
SODIUM: 139 mmol/L (ref 135–145)

## 2015-05-16 LAB — PROTIME-INR
INR: 1.95 — ABNORMAL HIGH (ref 0.00–1.49)
Prothrombin Time: 22.1 s — ABNORMAL HIGH (ref 11.6–15.2)

## 2015-05-16 LAB — CBC
HEMATOCRIT: 38.8 % — AB (ref 39.0–52.0)
HEMOGLOBIN: 12.7 g/dL — AB (ref 13.0–17.0)
MCH: 22.1 pg — ABNORMAL LOW (ref 26.0–34.0)
MCHC: 32.7 g/dL (ref 30.0–36.0)
MCV: 67.6 fL — AB (ref 78.0–100.0)
PLATELETS: 184 10*3/uL (ref 150–400)
RBC: 5.74 MIL/uL (ref 4.22–5.81)
RDW: 16.2 % — ABNORMAL HIGH (ref 11.5–15.5)
WBC: 8.5 10*3/uL (ref 4.0–10.5)

## 2015-05-16 LAB — HEPATIC FUNCTION PANEL
ALT: 43 U/L (ref 17–63)
AST: 68 U/L — AB (ref 15–41)
Albumin: 4.2 g/dL (ref 3.5–5.0)
Alkaline Phosphatase: 102 U/L (ref 38–126)
BILIRUBIN INDIRECT: 0.7 mg/dL (ref 0.3–0.9)
Bilirubin, Direct: 0.3 mg/dL (ref 0.1–0.5)
Total Bilirubin: 1 mg/dL (ref 0.3–1.2)
Total Protein: 6.5 g/dL (ref 6.5–8.1)

## 2015-05-16 LAB — I-STAT TROPONIN, ED: Troponin i, poc: 0 ng/mL (ref 0.00–0.08)

## 2015-05-16 LAB — BRAIN NATRIURETIC PEPTIDE: B Natriuretic Peptide: 72.3 pg/mL (ref 0.0–100.0)

## 2015-05-16 LAB — LIPASE, BLOOD: Lipase: 17 U/L — ABNORMAL LOW (ref 22–51)

## 2015-05-16 MED ORDER — MORPHINE SULFATE 4 MG/ML IJ SOLN
4.0000 mg | Freq: Once | INTRAMUSCULAR | Status: AC
  Administered 2015-05-16: 4 mg via INTRAVENOUS
  Filled 2015-05-16: qty 1

## 2015-05-16 MED ORDER — ONDANSETRON HCL 4 MG/2ML IJ SOLN
4.0000 mg | Freq: Once | INTRAMUSCULAR | Status: AC
  Administered 2015-05-16: 4 mg via INTRAVENOUS
  Filled 2015-05-16: qty 2

## 2015-05-16 MED ORDER — GI COCKTAIL ~~LOC~~
30.0000 mL | Freq: Once | ORAL | Status: AC
  Administered 2015-05-16: 30 mL via ORAL
  Filled 2015-05-16: qty 30

## 2015-05-16 NOTE — ED Notes (Signed)
Pt currently in radiology spoke with [place] transporter will bring pt back to room when finished.

## 2015-05-16 NOTE — ED Notes (Signed)
Pt. reports central chest pain with SOB onset this evening , denies emesis or diaphoresis , no cough or congestion .

## 2015-05-16 NOTE — ED Provider Notes (Signed)
CSN: [id number]     Arrival date & time 05/16/15  February 05, 2111 History   First MD Initiated Contact with Patient 05/16/15 02-06-44     Chief Complaint  Patient presents with  . Chest Pain     (Consider location/radiation/quality/duration/timing/severity/associated sxs/prior Treatment) HPI   59 year old male with history of DVT and PE currently on warfarin, diabetes, hypertension, obesity who presents for evaluation of chest pain. Patient reported approximately one hour ago he was eating dinner with family when he developed a sharp sensation to his epigastrium region, initially 10 out of 10 but has since improved to 5 out of 10. Pain is nonradiating, worsening with movement and with breathing. No associated nausea, dizziness, lightheadedness, diaphoresis, shortness of breath, or productive cough. No specific treatment tried. Patient states he was eating dressing and the pain started 20 minutes after. He was concerning for possible heart attack although denies having any prior history of heart attack. He has been taking his warfarin as prescribed. He denies having dyspnea or exertion or hemoptysis. No radiating pain, no back pain, no focal numbness or weakness. He denies any recent strenuous activities. He denies any prior history of MI.  Past Medical History  Diagnosis Date  . Diabetes mellitus     type II  . Hyperlipidemia   . Hypertension   . DVT, lower extremity     right  . Embolus 02/06/2004    pulmonary  . Depression   . Obesity   . BACK PAIN   . COLONIC POLYPS, HX OF   . DEGENERATIVE JOINT DISEASE, KNEES, BILATERAL   . DEPRESSION   . DIVERTICULOSIS, COLON   . HYPOGONADISM, MALE   . HYPOTENSION, ORTHOSTATIC   . OBESITY, MORBID   . Long term current use of anticoagulant   . Obstructive sleep apnea     uses cpap setting 8 to 10  . Kidney stones   . Renal insufficiency   . RENAL INSUFFICIENCY    Past Surgical History  Procedure Laterality Date  . Knee surgury  02-05-99 & 02/05/02     cartilage  damage  . Ankle surgury  02/06/00 bilateral    with bone spurs and torn tendon  . Edg  02/05/07    chronic Duodenitis  . Tonsillectomy and adenoidectomy  age 63 or 65  . Total knee arthroplasty  01/10/2012    Procedure: TOTAL KNEE ARTHROPLASTY;  Surgeon: [name], MD;  Location: [facility];  Service: Orthopedics;  Laterality: Left;  . Lumbar laminectomy/decompression microdiscectomy  09/28/2012    Procedure: LUMBAR LAMINECTOMY/DECOMPRESSION MICRODISCECTOMY 2 LEVELS;  Surgeon: [name], MD;  Location: [facility];  Service: Orthopedics;  Laterality: Left;  Decompressive laminectomy L4-L5. Microdiscectomy L5-S1  . Heel spur surgery Bilateral Feb 06, 2004  . Replacement total knee  2012-02-06   Family History  Problem Relation Age of Onset  . Hypertension Mother   . Prostate cancer Father   . Diabetes Sister     died with DM 2001-02-05, 2 more sisters with diabetes  . Diabetes Brother    History  Substance Use Topics  . Smoking status: Former Smoker -- 0.25 packs/day for 5 years    Quit date: 12/06/1978  . Smokeless tobacco: Never Used  . Alcohol Use: 0.0 oz/week    0 Standard drinks or equivalent per week     Comment: occasional    Review of Systems  All other systems reviewed and are negative.     Allergies  Other  Home Medications   Prior to  Admission medications   Medication Sig Start Date End Date Taking? Authorizing Provider  ACCU-CHEK SOFTCLIX LANCETS lancets Use as directed four times daily to check blood sugar.  Diagnosis code 250.00 08/30/13   Biagio Borg, MD  albuterol [facility] HFA) 108 (90 BASE) MCG/ACT inhaler Inhale 2 puffs into the lungs every 4 (four) hours as needed. For shortness of breath 10/10/13   Biagio Borg, MD  Alcohol Swabs (B-D SINGLE USE SWABS REGULAR) PADS Use as directed 08/30/13   Biagio Borg, MD  aspirin (ECOTRIN LOW STRENGTH) 81 MG EC tablet Take 81 mg by mouth daily before breakfast.     Historical Provider, MD  atorvastatin (LIPITOR) 80 MG tablet TAKE 1 TABLET (80  MG TOTAL) BY MOUTH DAILY. 03/05/15   Biagio Borg, MD  Blood Glucose Monitoring Suppl (ACCU-CHEK AVIVA PLUS) W/DEVICE KIT 1 each by Other route daily. Use as directed to check blood sugar. 08/30/13   Biagio Borg, MD  budesonide-formoterol [facility]) 80-4.5 MCG/ACT inhaler Inhale 2 puffs into the lungs 2 (two) times daily. 01/25/14   Rowe Clack, MD  ciprofloxacin (CIPRO) 500 MG tablet Take 1 tablet (500 mg total) by mouth 2 (two) times daily. 03/27/15   Biagio Borg, MD  enoxaparin (LOVENOX) 150 MG/ML injection Inject 1 mL (150 mg total) into the skin every 12 (twelve) hours. 05/14/14   Biagio Borg, MD  escitalopram (LEXAPRO) 10 MG tablet Take 1 tablet (10 mg total) by mouth daily. 12/21/14   Biagio Borg, MD  escitalopram (LEXAPRO) 10 MG tablet TAKE 1 TABLET (10 MG TOTAL) BY MOUTH DAILY. 01/21/15   Biagio Borg, MD  glucose blood (ACCU-CHEK AVIVA PLUS) test strip Use as directed four times daily to check blood sugar.  Diagnosis code 250.00 08/30/13   Biagio Borg, MD  lisinopril (PRINIVIL,ZESTRIL) 5 MG tablet Take 5 mg by mouth daily. 08/30/13   Biagio Borg, MD  LYRICA 50 MG capsule TAKE 1 CAPSULE BY MOUTH TWICE DAILY 12/31/14   Biagio Borg, MD  metFORMIN (GLUCOPHAGE) 500 MG tablet Take 1 tablet (500 mg total) by mouth daily with breakfast. 04/24/15   Renato Shin, MD  mupirocin ointment (BACTROBAN) 2 % Applied twice a day to the affected area;NOT into eyes. 06/19/14   Hendricks Limes, MD  sennosides-docusate sodium (SENOKOT-S) 8.6-50 MG tablet Take 1 tablet by mouth. 07/17/14 07/17/15  Historical Provider, MD  warfarin (COUMADIN) 5 MG tablet Take as directed by anticoagulation clinic 03/19/15   Biagio Borg, MD   BP 175/104 mmHg  Pulse 74  Temp(Src) 97.8 F (36.6 C) (Oral)  Resp 18  Ht '6\' 2"'  (1.88 m)  Wt 274 lb (124.286 kg)  BMI 35.16 kg/m2  SpO2 100% Physical Exam  Constitutional: He appears well-developed and well-nourished. No distress.  HENT:  Head: Atraumatic.  Eyes: Conjunctivae are  normal.  Neck: Neck supple.  Cardiovascular: Normal rate and regular rhythm.  Exam reveals no gallop and no friction rub.   No murmur heard. Pulmonary/Chest: Effort normal and breath sounds normal. No respiratory distress. He has no wheezes. He has no rales. He exhibits no tenderness.  Abdominal: Soft. There is tenderness (Point tenderness to epigastric without guarding or rebound tenderness. Negative Murphy sign, no pain of the umbilicus point. No skin changes.). There is no rebound and no guarding.  Musculoskeletal: He exhibits no edema.  Neurological: He is alert.  Skin: No rash noted.  Psychiatric: He has a normal mood and affect.  Nursing note and vitals reviewed.   ED Course  Procedures (including critical care time)  Patient with moderate cardiac risk factors here with epigastric abdominal pain. Pain is reproducible on exam. Pain happened shortly after he ate. He has history of chronic duodenitis. He has an intact gallbladder. Workup initiated, will also evaluate for biliary disease. If normal, patient will need to be evaluated further for cardiac rule out. Care discussed with Dr. Ralene Bathe.  10:57 PM INR is 1.95, mildly subtherapeutic. However he has no shortness of breath and no hypoxia to suggest PE as a cause. Chest x-ray shows no acute abnormalities. Labs otherwise reassuring. EKG showed many artifacts, will repeat.    11:36 PM Cardiac stress test 05/06/15 with impression as followed: Myocardial perfusion is abnormal. The study is normal. This is an intermediate risk study. Overall left ventricular systolic function was abnormal. LV cavity size is normal. The left ventricular ejection fraction is mildly decreased (45-54%). Compared to the prior study, there are changes.Prior EF 55%, ?scar  Although epigastric pain may not be cardiac related, pt sts he felt as if he is having an MI, and recent stress test is not normal;  I will consult cardiology for further care.  Pt sts his pain is  mostly resolved after receiving GI cocktail and morphine.  Dr. Ralene Bathe has seen and evaluated pt.    11:46 PM Appreciate consultation from cardiology fellow Dr. Posey Pronto who felt that pt's sxs not likely cardiac in nature but agrees to see pt in ER.    12:29 AM At this time patient has no significant tenderness. No epigastric pain or right upper quadrant abdominal pain on reexamination. Patient resting comfortably. Plan to obtain delta troponin and if normal and patient has no significant discomfort, he agrees to follow-up outpatient for further care. All questions answered to patient's satisfaction.  12:56 AM Cardiology Fellow Dr. Posey Pronto has seen and evaluated pt and felt this is not likely cardiac and more likely esophageal spasm.  Pt currently sxs free.  Care discussed with oncoming provider who will dispo pt pending delta troponin and reassessment.    Labs Review Labs Reviewed  CBC - Abnormal; Notable for the following:    Hemoglobin 12.7 (*)    HCT 38.8 (*)    MCV 67.6 (*)    MCH 22.1 (*)    RDW 16.2 (*)    All other components within normal limits  BASIC METABOLIC PANEL - Abnormal; Notable for the following:    Glucose, Bld 147 (*)    All other components within normal limits  PROTIME-INR - Abnormal; Notable for the following:    Prothrombin Time 22.1 (*)    INR 1.95 (*)    All other components within normal limits  HEPATIC FUNCTION PANEL - Abnormal; Notable for the following:    AST 68 (*)    All other components within normal limits  LIPASE, BLOOD - Abnormal; Notable for the following:    Lipase 17 (*)    All other components within normal limits  BRAIN NATRIURETIC PEPTIDE  I-STAT TROPOININ, ED  Randolm Idol, ED    Imaging Review Dg Chest 2 View  05/16/2015   CLINICAL DATA:  Mid chest pain and shortness of breath. Hypertension. Diabetes.  EXAM: CHEST  2 VIEW  COMPARISON:  01/03/2014  FINDINGS: Mild left hemidiaphragm eventration. Midline trachea. Normal heart size.  Tortuous thoracic aorta with transverse aortic atherosclerosis. No pleural effusion or pneumothorax. No congestive failure. Clear lungs.  IMPRESSION: No acute cardiopulmonary disease.  Aortic atherosclerosis.   Electronically Signed   By: Abigail Miyamoto M.D.   On: 05/16/2015 21:52     EKG Interpretation   Date/Time:  Friday May 16 2015 21:18:39 EDT Ventricular Rate:  68 PR Interval:  174 QRS Duration: 88 QT Interval:  390 QTC Calculation: 414 R Axis:   51 Text Interpretation:  Sinus rhythm with occasional Premature ventricular  complexes Otherwise normal ECG Confirmed by Hazle Coca [phone]) on 05/16/2015  10:01:31 PM      MDM   Final diagnoses:  Upper abdominal pain    BP 161/99 mmHg  Pulse 59  Temp(Src) 98.7 F (37.1 C) (Oral)  Resp 20  Ht '6\' 2"'  (1.88 m)  Wt 274 lb (124.286 kg)  BMI 35.16 kg/m2  SpO2 95%  I have reviewed nursing notes and vital signs. I personally reviewed the imaging tests through PACS system  I reviewed available ER/hospitalization records thought the EMR     Domenic Moras, PA-C 05/17/15 [place], PA-C 05/17/15 0105  Quintella Reichert, MD 05/18/15 [phone]

## 2015-05-17 LAB — I-STAT TROPONIN, ED: Troponin i, poc: 0 ng/mL (ref 0.00–0.08)

## 2015-05-17 MED ORDER — MORPHINE SULFATE 4 MG/ML IJ SOLN
4.0000 mg | Freq: Once | INTRAMUSCULAR | Status: AC
  Administered 2015-05-17: 4 mg via INTRAVENOUS
  Filled 2015-05-17: qty 1

## 2015-05-17 MED ORDER — GI COCKTAIL ~~LOC~~
30.0000 mL | Freq: Once | ORAL | Status: DC
  Filled 2015-05-17: qty 30

## 2015-05-17 MED ORDER — OMEPRAZOLE 20 MG PO CPDR: 20.0000 mg | DELAYED_RELEASE_CAPSULE | Freq: Every day | ORAL | Status: DC

## 2015-05-17 NOTE — ED Notes (Signed)
Pt c/o chest pain at this time. Rates pain 6 out of 10. Repeat EKG completed; MD made aware.

## 2015-05-17 NOTE — Discharge Instructions (Signed)
You have been evaluated for your upper abdominal pain/chest pain.  No significant evidence to suggest a heart attack.  Your warfarin level is 1.94, which is below the therapeutic level. You may double your normal dose tomorrow then resume your normal dose the following days. Take prilosec 30 minutes before each meal for the next week.  Follow up closely with your doctor for further care.  Return to ER if your condition worsen or if you have other concerns.   Chest Pain (Nonspecific) It is often hard to give a specific diagnosis for the cause of chest pain. There is always a chance that your pain could be related to something serious, such as a heart attack or a blood clot in the lungs. You need to follow up with your health care provider for further evaluation. CAUSES   Heartburn.  Pneumonia or bronchitis.  Anxiety or stress.  Inflammation around your heart (pericarditis) or lung (pleuritis or pleurisy).  A blood clot in the lung.  A collapsed lung (pneumothorax). It can develop suddenly on its own (spontaneous pneumothorax) or from trauma to the chest.  Shingles infection (herpes zoster virus). The chest wall is composed of bones, muscles, and cartilage. Any of these can be the source of the pain.  The bones can be bruised by injury.  The muscles or cartilage can be strained by coughing or overwork.  The cartilage can be affected by inflammation and become sore (costochondritis). DIAGNOSIS  Lab tests or other studies may be needed to find the cause of your pain. Your health care provider may have you take a test called an ambulatory electrocardiogram (ECG). An ECG records your heartbeat patterns over a 24-hour period. You may also have other tests, such as:  Transthoracic echocardiogram (TTE). During echocardiography, sound waves are used to evaluate how blood flows through your heart.  Transesophageal echocardiogram (TEE).  Cardiac monitoring. This allows your health care provider  to monitor your heart rate and rhythm in real time.  Holter monitor. This is a portable device that records your heartbeat and can help diagnose heart arrhythmias. It allows your health care provider to track your heart activity for several days, if needed.  Stress tests by exercise or by giving medicine that makes the heart beat faster. TREATMENT   Treatment depends on what may be causing your chest pain. Treatment may include:  Acid blockers for heartburn.  Anti-inflammatory medicine.  Pain medicine for inflammatory conditions.  Antibiotics if an infection is present.  You may be advised to change lifestyle habits. This includes stopping smoking and avoiding alcohol, caffeine, and chocolate.  You may be advised to keep your head raised (elevated) when sleeping. This reduces the chance of acid going backward from your stomach into your esophagus. Most of the time, nonspecific chest pain will improve within 2-3 days with rest and mild pain medicine.  HOME CARE INSTRUCTIONS   If antibiotics were prescribed, take them as directed. Finish them even if you start to feel better.  For the next few days, avoid physical activities that bring on chest pain. Continue physical activities as directed.  Do not use any tobacco products, including cigarettes, chewing tobacco, or electronic cigarettes.  Avoid drinking alcohol.  Only take medicine as directed by your health care provider.  Follow your health care provider's suggestions for further testing if your chest pain does not go away.  Keep any follow-up appointments you made. If you do not go to an appointment, you could develop lasting (chronic) problems  with pain. If there is any problem keeping an appointment, call to reschedule. SEEK MEDICAL CARE IF:   Your chest pain does not go away, even after treatment.  You have a rash with blisters on your chest.  You have a fever. SEEK IMMEDIATE MEDICAL CARE IF:   You have increased  chest pain or pain that spreads to your arm, neck, jaw, back, or abdomen.  You have shortness of breath.  You have an increasing cough, or you cough up blood.  You have severe back or abdominal pain.  You feel nauseous or vomit.  You have severe weakness.  You faint.  You have chills. This is an emergency. Do not wait to see if the pain will go away. Get medical help at once. Call your local emergency services (911 in [place] Do not drive yourself to the hospital. MAKE SURE YOU:   Understand these instructions.  Will watch your condition.  Will get help right away if you are not doing well or get worse. Document Released: 09/01/2005 Document Revised: 11/27/2013 Document Reviewed: 06/27/2008 [facility] Patient Information 2015 [place], [place]. This information is not intended to replace advice given to you by your health care provider. Make sure you discuss any questions you have with your health care provider.

## 2015-05-17 NOTE — ED Provider Notes (Signed)
Patient received in signout. Atypical chest pain in the epigastric and lower chest region. EKG without any concerning findings. Troponin 2 is negative. Cardiology is evaluated the patient in the can be discharged home. Patient states had return of chest pain. It was easily reproduced with palpation in the epigastric and lower chest wall. Repeat EKG without any concerning findings. I believe patient is safe to be discharged home to follow-up with his cardiologist.  Julianne Rice, MD 05/17/15 [phone]

## 2015-05-17 NOTE — Consult Note (Signed)
CARDIOLOGY CONSULT NOTE  Assessment and Plan:  *Non-cardiac chest pain:  [name] is a 59 year old male with history of DVT and PE on systemic endocoagulation, type 2 diabetes, hypertension, obesity comes to the emergency department with epigastric chest pain that started 20 minutes after eating [place]. His symptoms improved after having a GI cocktail and morphine. Symptoms are very atypical to be suggestive of acute coronary syndrome. Objective data so far suggested normal EKG and normal cardiac biomarkers. Furthermore, stress test showed possible fixed lesion in the inferolateral territory. At this point, I do not believe patient is having acute coroner syndrome. His symptoms are more suggestive of esophageal spasms. Would recommend outpatient cardiology follow-up assuming he rules out for ACS with serial cardiac biomarkers. -- Continue aspirin 81 mg daily -- Continue atorvastatin 80 mg daily -- Continue lisinopril 5mg  daily  Chief complaint: Epigastric pain  HPI:  [name] is a 59 year old male with history of DVT and PE currently on systemic anticoagulation, type 2 diabetes, hypertension, obesity comes to the emergency department for evaluation of epigastric pain. Patient states that he was doing fairly well up until this evening when he had epigastric sudden onset pain 20 minutes after eating a [place]. Patient endorses sudden onset pain without any other associated symptoms such as diaphoresis, shortness of breath. He initially tried to ambulate around the house thinking that would help his pain but had no improvement. Recently, he was informed that his stress test might of been abnormal and therefore he was worried about his heart and decided come to the emergency department. In the ED, he was given a GI cocktail with morphine and had improvement in his symptoms. Patient otherwise has been ambulating up to 2 miles a day without having any restrictions. He endorses no symptoms suggestive  of stable or unstable angina. He also denies any symptoms suggestive of volume overload or heart failure.  Cardiac history:  Type 2 diabetes  Hypertension  History of DVT and PE on systemic anticoagulation Previous cardiac imaging  EKG 05/17/2015: Sinus bradycardia  TTE: 09/13/2013: Moderate LVH, LVEF 65-70%  NM Stress test: 05/06/2015: There is a fixed mid to basal inferolateral wall defect suggesting scar.  Prior cath:  None Past Medical History Past Medical History  Diagnosis Date  . Diabetes mellitus     type II  . Hyperlipidemia   . Hypertension   . DVT, lower extremity     right  . Embolus 2005    pulmonary  . Depression   . Obesity   . BACK PAIN   . COLONIC POLYPS, HX OF   . DEGENERATIVE JOINT DISEASE, KNEES, BILATERAL   . DEPRESSION   . DIVERTICULOSIS, COLON   . HYPOGONADISM, MALE   . HYPOTENSION, ORTHOSTATIC   . OBESITY, MORBID   . Long term current use of anticoagulant   . Obstructive sleep apnea     uses cpap setting 8 to 10  . Kidney stones   . Renal insufficiency   . RENAL INSUFFICIENCY     Allergies: Allergies  Allergen Reactions  . Other     NO BLOOD -JEHOVAH'S WITNESS-SIGNED REFUSAL BUT WOULD TAKE ALBUMIN    Social History History   Social History  . Marital Status: Single    Spouse Name: N/A  . Number of Children: 2  . Years of Education: N/A   Occupational History  . back at work at [employer] since Jan. 2010    Social History Main Topics  . Smoking  status: Former Smoker -- 0.25 packs/day for 5 years    Quit date: 12/06/1978  . Smokeless tobacco: Never Used  . Alcohol Use: 0.0 oz/week    0 Standard drinks or equivalent per week     Comment: occasional  . Drug Use: No  . Sexual Activity: Not Currently   Other Topics Concern  . Not on file   Social History Narrative   Daily caffeine use one per day   Protein drinks    Live alone   Children; a friend and sister that helps   Brother that is around [place])    HAVE A SON AND DTR IN [place]    Family History Family History  Problem Relation Age of Onset  . Hypertension Mother   . Prostate cancer Father   . Diabetes Sister     died with DM 04/03/2001, 2 more sisters with diabetes  . Diabetes Brother     Physical Exam Filed Vitals:   05/16/15 2315  BP: 161/99  Pulse: 59  Temp:   Resp: 20     Gen: HEENT: Neck: CV: Pulm: Abdomen: Ext:  Labs:  Results for orders placed or performed during the hospital encounter of 05/16/15 (from the past 24 hour(s))  CBC     Status: Abnormal   Collection Time: 05/16/15  9:23 PM  Result Value Ref Range   WBC 8.5 4.0 - 10.5 K/uL   RBC 5.74 4.22 - 5.81 MIL/uL   Hemoglobin 12.7 (L) 13.0 - 17.0 g/dL   HCT 38.8 (L) 39.0 - 52.0 %   MCV 67.6 (L) 78.0 - 100.0 fL   MCH 22.1 (L) 26.0 - 34.0 pg   MCHC 32.7 30.0 - 36.0 g/dL   RDW 16.2 (H) 11.5 - 15.5 %   Platelets 184 150 - 400 K/uL  Basic metabolic panel     Status: Abnormal   Collection Time: 05/16/15  9:23 PM  Result Value Ref Range   Sodium 139 135 - 145 mmol/L   Potassium 4.6 3.5 - 5.1 mmol/L   Chloride 105 101 - 111 mmol/L   CO2 24 22 - 32 mmol/L   Glucose, Bld 147 (H) 65 - 99 mg/dL   BUN 12 6 - 20 mg/dL   Creatinine, Ser 0.97 0.61 - 1.24 mg/dL   Calcium 9.2 8.9 - 10.3 mg/dL   GFR calc non Af Amer >60 >60 mL/min   GFR calc Af Amer >60 >60 mL/min   Anion gap 10 5 - 15  BNP (order ONLY if patient complains of dyspnea/SOB AND you have documented it for THIS visit)     Status: None   Collection Time: 05/16/15  9:23 PM  Result Value Ref Range   B Natriuretic Peptide 72.3 0.0 - 100.0 pg/mL  I-stat troponin, ED  (not at [facility], [facility])     Status: None   Collection Time: 05/16/15  9:29 PM  Result Value Ref Range   Troponin i, poc 0.00 0.00 - 0.08 ng/mL   Comment 3          Hepatic function panel     Status: Abnormal   Collection Time: 05/16/15  9:30 PM  Result Value Ref Range   Total Protein 6.5 6.5 - 8.1 g/dL   Albumin 4.2 3.5 - 5.0 g/dL   AST  68 (H) 15 - 41 U/L   ALT 43 17 - 63 U/L   Alkaline Phosphatase 102 38 - 126 U/L   Total Bilirubin 1.0 0.3 - 1.2 mg/dL  Bilirubin, Direct 0.3 0.1 - 0.5 mg/dL   Indirect Bilirubin 0.7 0.3 - 0.9 mg/dL  Lipase, blood     Status: Abnormal   Collection Time: 05/16/15  9:30 PM  Result Value Ref Range   Lipase 17 (L) 22 - 51 U/L  Protime-INR     Status: Abnormal   Collection Time: 05/16/15  9:50 PM  Result Value Ref Range   Prothrombin Time 22.1 (H) 11.6 - 15.2 seconds   INR 1.95 (H) 0.00 - 1.49

## 2015-05-17 NOTE — ED Notes (Signed)
Pt is getting dressed.

## 2015-05-20 ENCOUNTER — Ambulatory Visit: Payer: Self-pay | Admitting: Internal Medicine

## 2015-06-03 ENCOUNTER — Ambulatory Visit (INDEPENDENT_AMBULATORY_CARE_PROVIDER_SITE_OTHER): Payer: PPO | Admitting: General Practice

## 2015-06-03 DIAGNOSIS — Z5181 Encounter for therapeutic drug level monitoring: Secondary | ICD-10-CM

## 2015-06-03 DIAGNOSIS — I2699 Other pulmonary embolism without acute cor pulmonale: Secondary | ICD-10-CM

## 2015-06-03 LAB — POCT INR: INR: 1.9

## 2015-06-03 NOTE — Progress Notes (Signed)
Pre visit review using our clinic review tool, if applicable. No additional management support is needed unless otherwise documented below in the visit note. 

## 2015-06-09 NOTE — Progress Notes (Signed)
I have reviewed and agree with the plan. 

## 2015-07-01 ENCOUNTER — Ambulatory Visit (INDEPENDENT_AMBULATORY_CARE_PROVIDER_SITE_OTHER): Payer: PPO | Admitting: General Practice

## 2015-07-01 DIAGNOSIS — Z5181 Encounter for therapeutic drug level monitoring: Secondary | ICD-10-CM

## 2015-07-01 DIAGNOSIS — I2699 Other pulmonary embolism without acute cor pulmonale: Secondary | ICD-10-CM | POA: Diagnosis not present

## 2015-07-01 LAB — POCT INR: INR: 2.6

## 2015-07-01 NOTE — Progress Notes (Signed)
I have reviewed and agree with the plan. 

## 2015-07-01 NOTE — Progress Notes (Signed)
Pre visit review using our clinic review tool, if applicable. No additional management support is needed unless otherwise documented below in the visit note. 

## 2015-07-18 ENCOUNTER — Ambulatory Visit (INDEPENDENT_AMBULATORY_CARE_PROVIDER_SITE_OTHER): Payer: PPO | Admitting: Cardiology

## 2015-07-18 ENCOUNTER — Encounter: Payer: Self-pay | Admitting: Cardiology

## 2015-07-18 VITALS — BP 130/80 | HR 63 | Ht 74.0 in | Wt 272.0 lb

## 2015-07-18 DIAGNOSIS — R600 Localized edema: Secondary | ICD-10-CM | POA: Diagnosis not present

## 2015-07-18 DIAGNOSIS — R079 Chest pain, unspecified: Secondary | ICD-10-CM | POA: Diagnosis not present

## 2015-07-18 DIAGNOSIS — I10 Essential (primary) hypertension: Secondary | ICD-10-CM | POA: Diagnosis not present

## 2015-07-18 DIAGNOSIS — E785 Hyperlipidemia, unspecified: Secondary | ICD-10-CM

## 2015-07-18 MED ORDER — HYDROCHLOROTHIAZIDE 25 MG PO TABS: 25.0000 mg | ORAL_TABLET | Freq: Every day | ORAL | Status: DC

## 2015-07-18 NOTE — Progress Notes (Signed)
Patient ID: [name], male   DOB: 1956-04-07, 59 y.o.   MRN: [id number]      Cardiology Office Note  Date:  07/18/2015   ID:  [name], [name] May 18, 1956, MRN [id number]  PCP:  Cathlean Cower, MD  Cardiologist:   Dorothy Spark, MD   Chief complain: abnormal stress test   History of Present Illness: [name] is a 59 y.o. male with h/o DM, obesity hypertension and hyperlipidemia who went to the ER in June 2016 with retrosternal chest pain. His ECG was normal, ACS was ruled out and he underwent a stress test that showed a possible old MI in the inferolateral wall and the study was called intermediate risk. The patient developed another episode of a different chest pain while working in his back yard, the pain was radiating to his neck and left arm and lasted for about 30 minutes then resolved at rest.  He [place] has a h/o multiple DVTs and two pulmonary embolisms and is on chronic anticoagulation therapy. He is occasional smoker. He doesn't have a family of premature CAD. He denies orthopnea, PND, but has chronic LE edema. No palpitations or syncope.   Past Medical History  Diagnosis Date  . Diabetes mellitus     type II  . Hyperlipidemia   . Hypertension   . DVT, lower extremity     right  . Embolus 2005    pulmonary  . Depression   . Obesity   . BACK PAIN   . COLONIC POLYPS, HX OF   . DEGENERATIVE JOINT DISEASE, KNEES, BILATERAL   . DEPRESSION   . DIVERTICULOSIS, COLON   . HYPOGONADISM, MALE   . HYPOTENSION, ORTHOSTATIC   . OBESITY, MORBID   . Long term current use of anticoagulant   . Obstructive sleep apnea     uses cpap setting 8 to 10  . Kidney stones   . Renal insufficiency   . RENAL INSUFFICIENCY     Past Surgical History  Procedure Laterality Date  . Knee surgury  2000 & 2003     cartilage damage  . Ankle surgury  2001 bilateral    with bone spurs and torn tendon  . Edg  2008    chronic Duodenitis  . Tonsillectomy and adenoidectomy  age 69 or  57  . Total knee arthroplasty  01/10/2012    Procedure: TOTAL KNEE ARTHROPLASTY;  Surgeon: [name], MD;  Location: [facility];  Service: Orthopedics;  Laterality: Left;  . Lumbar laminectomy/decompression microdiscectomy  09/28/2012    Procedure: LUMBAR LAMINECTOMY/DECOMPRESSION MICRODISCECTOMY 2 LEVELS;  Surgeon: [name], MD;  Location: [facility];  Service: Orthopedics;  Laterality: Left;  Decompressive laminectomy L4-L5. Microdiscectomy L5-S1  . Heel spur surgery Bilateral 2005  . Replacement total knee  2013     Current Outpatient Prescriptions  Medication Sig Dispense Refill  . albuterol (PROAIR HFA) 108 (90 BASE) MCG/ACT inhaler Inhale 2 puffs into the lungs every 4 (four) hours as needed. For shortness of breath 1 Inhaler 11  . aspirin (ECOTRIN LOW STRENGTH) 81 MG EC tablet Take 81 mg by mouth daily before breakfast.     . atorvastatin (LIPITOR) 80 MG tablet TAKE 1 TABLET (80 MG TOTAL) BY MOUTH DAILY. 90 tablet 1  . budesonide-formoterol (SYMBICORT) 80-4.5 MCG/ACT inhaler Inhale 2 puffs into the lungs 2 (two) times daily. 1 Inhaler 3  . escitalopram (LEXAPRO) 10 MG tablet TAKE 1 TABLET (10 MG TOTAL) BY MOUTH DAILY. 90 tablet  3  . lisinopril (PRINIVIL,ZESTRIL) 5 MG tablet Take 5 mg by mouth daily.    Marland Kitchen LYRICA 50 MG capsule TAKE 1 CAPSULE BY MOUTH TWICE DAILY 60 capsule 5  . metFORMIN (GLUCOPHAGE) 500 MG tablet Take 1 tablet (500 mg total) by mouth daily with breakfast. 180 tablet 3  . omeprazole (PRILOSEC) 20 MG capsule Take 1 capsule (20 mg total) by mouth daily. 10 capsule 0  . warfarin (COUMADIN) 5 MG tablet Take as directed by anticoagulation clinic (Patient taking differently: Take 10-15 mg by mouth daily. Take 10 mg on Sun / Mon / Wed / Thurs / Fri / Sat Take 15 mg on Tuesday ONLY) 180 tablet 1   No current facility-administered medications for this visit.    Allergies:   Other    Social History:  The patient  reports that he quit smoking about 36 years ago. He has  never used smokeless tobacco. He reports that he drinks alcohol. He reports that he does not use illicit drugs.   Family History:  The patient's family history includes Diabetes in his brother, sister, sister, and sister; Heart failure in his sister; Hypertension in his mother; Prostate cancer in his father.    ROS:  Please see the history of present illness.   Otherwise, review of systems are positive for none.   All other systems are reviewed and negative.    PHYSICAL EXAM: VS:  BP 130/80 mmHg  Pulse 63  Ht 6\' 2"  (1.88 m)  Wt 272 lb (123.378 kg)  BMI 34.91 kg/m2 , BMI Body mass index is 34.91 kg/(m^2). GEN: Well nourished, well developed, in no acute distress HEENT: normal Neck: no JVD, carotid bruits, or masses Cardiac: RRR; no murmurs, rubs, or gallops, B/L non-pitting up to the knees edema  Respiratory:  clear to auscultation bilaterally, normal work of breathing GI: soft, nontender, nondistended, + BS MS: no deformity or atrophy Skin: warm and dry, no rash Neuro:  Strength and sensation are intact Psych: euthymic mood, full affect   EKG:  EKG is not ordered today. The ekg ordered on 05/17/2015 shows NSR, normal ECG  Recent Labs: 09/30/2014: TSH 1.30 05/16/2015: ALT 43; B Natriuretic Peptide 72.3; BUN 12; Creatinine, Ser 0.97; Hemoglobin 12.7*; Platelets 184; Potassium 4.6; Sodium 139   Lipid Panel    Component Value Date/Time   CHOL 81 09/30/2014 0929   TRIG 78.0 09/30/2014 0929   HDL 28.20* 09/30/2014 0929   CHOLHDL 3 09/30/2014 0929   VLDL 15.6 09/30/2014 0929   LDLCALC 37 09/30/2014 0929   LDLDIRECT 121.2 05/25/2011 0827   Wt Readings from Last 3 Encounters:  07/18/15 272 lb (123.378 kg)  05/16/15 274 lb (124.286 kg)  05/06/15 272 lb (123.378 kg)    TTE: 09/13/2013  Left ventricle: The cavity size was normal. Wall thickness was increased in a pattern of moderate LVH. Systolic function was vigorous. The estimated ejection fraction was in the range of 65% to  70%. Wall motion was normal; there were no regional wall motion abnormalities. ------------------------------------------------------------ Aortic valve:  Structurally normal valve.  Cusp separation was normal. Doppler: Transvalvular velocity was within the normal range. There was no stenosis. No regurgitation. ------------------------------------------------------------ Aorta: Ascending aorta: The ascending aorta was mildly dilated. ------------------------------------------------------------ Mitral valve:  Structurally normal valve.  Leaflet separation was normal. Doppler: Transvalvular velocity was within the normal range. There was no evidence for stenosis. No regurgitation.  Peak gradient: 41mm Hg (D). ------------------------------------------------------------ Left atrium: The atrium was normal in size. ------------------------------------------------------------ Right  ventricle: The RV is not seen well. It is not possible to evaluate the RV size and function in this study. Not able to assess RV function due to poor visualization. Not able to assess RV size due to poor visualization. ----------------------------------------------------------- Pulmonic valve:  The valve appears to be grossly normal. Doppler:  No significant regurgitation. ------------------------------------------------------------ Tricuspid valve: Poorly visualized. Doppler:  No significant regurgitation. ------------------------------------------------------------ Pericardium: There was no pericardial effusion. ------------------------------------------------------------ Systemic veins: Inferior vena cava: The vessel was normal in size; the respirophasic diameter changes were in the normal range (= 50%); findings are consistent with normal central venous pressure.    ASSESSMENT AND PLAN:  1. Chest pain - typical exertional and resolved at rest, risk factors include DM,  hypertension, obesity and hyperlipidemia. His stress test was affected by significant attenuation sec to his size. We will schedule a coronary CTA to evaluate for degree of coronary plaque.  2. Hypertension - controlled  3. LE edema - we will start HCTZ 25 mg po daily  4. Hyperlipidemia - on atorvastatin 80 mg po daily, on 05/16/15 - HDL low 28, LDL 37, TG 78.  If normal stress test follow up in 2 years.  Signed, Dorothy Spark, MD  07/18/2015 2:19 PM    [facility] [place], [place],   [address] Phone: [phone]; Fax: [phone]

## 2015-07-18 NOTE — Patient Instructions (Addendum)
Medication Instructions:  Your physician has recommended you make the following change in your medication:  START HCTZ 25mg  daily  Labwork: None   Testing/Procedures: Your physician has requested that you have cardiac CT. Cardiac computed tomography (CT) is a painless test that uses an x-ray machine to take clear, detailed pictures of your heart. For further information please visit [facility]. Please follow instruction sheet as given.    Follow-Up: Your physician wants you to follow-up in: 2 year with Dr.Nelson You will receive a reminder letter in the mail two months in advance. If you don't receive a letter, please call our office to schedule the follow-up appointment.    Any Other Special Instructions Will Be Listed Below (If Applicable).

## 2015-07-25 ENCOUNTER — Ambulatory Visit (INDEPENDENT_AMBULATORY_CARE_PROVIDER_SITE_OTHER): Payer: PPO | Admitting: Endocrinology

## 2015-07-25 ENCOUNTER — Encounter: Payer: Self-pay | Admitting: Cardiology

## 2015-07-25 ENCOUNTER — Encounter: Payer: Self-pay | Admitting: Endocrinology

## 2015-07-25 VITALS — BP 143/88 | HR 59 | Temp 98.5°F | Ht 74.0 in | Wt 267.0 lb

## 2015-07-25 DIAGNOSIS — E119 Type 2 diabetes mellitus without complications: Secondary | ICD-10-CM

## 2015-07-25 LAB — POCT GLYCOSYLATED HEMOGLOBIN (HGB A1C): HEMOGLOBIN A1C: 6.1

## 2015-07-25 NOTE — Progress Notes (Signed)
Subjective:    Patient ID: [name], male    DOB: [birth year], 59 y.o.   MRN: [id number]  HPI Pt returns for f/u of diabetes mellitus: DM type: Insulin-requiring type 2 Dx'ed: 9373 Complications: renal insufficiency and autonomic neuropathy Therapy: insulin 2004-2015; now metformin only DKA: never Severe hypoglycemia: never. Pancreatitis: never Other: He has gastric bypass surgery at [facility], in 2015; Since then, he has lost 165 lbs, and went off insulin. Interval history: no cbg record, but states cbg's are well-controlled.  pt states he feels well in general.   Past Medical History  Diagnosis Date  . Diabetes mellitus     type II  . Hyperlipidemia   . Hypertension   . DVT, lower extremity     right  . Embolus 2005    pulmonary  . Depression   . Obesity   . BACK PAIN   . COLONIC POLYPS, HX OF   . DEGENERATIVE JOINT DISEASE, KNEES, BILATERAL   . DEPRESSION   . DIVERTICULOSIS, COLON   . HYPOGONADISM, MALE   . HYPOTENSION, ORTHOSTATIC   . OBESITY, MORBID   . Long term current use of anticoagulant   . Obstructive sleep apnea     uses cpap setting 8 to 10  . Kidney stones   . Renal insufficiency   . RENAL INSUFFICIENCY     Past Surgical History  Procedure Laterality Date  . Knee surgury  2000 & 2003     cartilage damage  . Ankle surgury  2001 bilateral    with bone spurs and torn tendon  . Edg  2008    chronic Duodenitis  . Tonsillectomy and adenoidectomy  age 34 or 26  . Total knee arthroplasty  01/10/2012    Procedure: TOTAL KNEE ARTHROPLASTY;  Surgeon: [name], MD;  Location: [facility];  Service: Orthopedics;  Laterality: Left;  . Lumbar laminectomy/decompression microdiscectomy  09/28/2012    Procedure: LUMBAR LAMINECTOMY/DECOMPRESSION MICRODISCECTOMY 2 LEVELS;  Surgeon: [name], MD;  Location: [facility];  Service: Orthopedics;  Laterality: Left;  Decompressive laminectomy L4-L5. Microdiscectomy L5-S1  . Heel spur surgery Bilateral 2005  .  Replacement total knee  2013    Social History   Social History  . Marital Status: Single    Spouse Name: N/A  . Number of Children: 2  . Years of Education: N/A   Occupational History  . back at work at [employer] since Jan. 2010    Social History Main Topics  . Smoking status: Former Smoker -- 0.25 packs/day for 5 years    Quit date: 12/06/1978  . Smokeless tobacco: Never Used  . Alcohol Use: 0.0 oz/week    0 Standard drinks or equivalent per week     Comment: occasional  . Drug Use: No  . Sexual Activity: Not Currently   Other Topics Concern  . Not on file   Social History Narrative   Daily caffeine use one per day   Protein drinks    Live alone   Children; a friend and sister that helps   Brother that is around [place])   HAVE A SON AND DTR IN [place]    Current Outpatient Prescriptions on File Prior to Visit  Medication Sig Dispense Refill  . albuterol (PROAIR HFA) 108 (90 BASE) MCG/ACT inhaler Inhale 2 puffs into the lungs every 4 (four) hours as needed. For shortness of breath 1 Inhaler 11  . aspirin (ECOTRIN LOW STRENGTH) 81 MG EC tablet Take 81  mg by mouth daily before breakfast.     . atorvastatin (LIPITOR) 80 MG tablet TAKE 1 TABLET (80 MG TOTAL) BY MOUTH DAILY. 90 tablet 1  . budesonide-formoterol (SYMBICORT) 80-4.5 MCG/ACT inhaler Inhale 2 puffs into the lungs 2 (two) times daily. 1 Inhaler 3  . escitalopram (LEXAPRO) 10 MG tablet TAKE 1 TABLET (10 MG TOTAL) BY MOUTH DAILY. 90 tablet 3  . hydrochlorothiazide (HYDRODIURIL) 25 MG tablet Take 1 tablet (25 mg total) by mouth daily. 30 tablet 11  . lisinopril (PRINIVIL,ZESTRIL) 5 MG tablet Take 5 mg by mouth daily.    Marland Kitchen LYRICA 50 MG capsule TAKE 1 CAPSULE BY MOUTH TWICE DAILY 60 capsule 5  . metFORMIN (GLUCOPHAGE) 500 MG tablet Take 1 tablet (500 mg total) by mouth daily with breakfast. 180 tablet 3  . omeprazole (PRILOSEC) 20 MG capsule Take 1 capsule (20 mg total) by mouth daily. 10 capsule 0    . warfarin (COUMADIN) 5 MG tablet Take as directed by anticoagulation clinic (Patient taking differently: Take 10-15 mg by mouth daily. Take 10 mg on 2023-03-12 / Mon / Wed / Thurs / Fri / Sat Take 15 mg on Tuesday ONLY) 180 tablet 1   No current facility-administered medications on file prior to visit.    Allergies  Allergen Reactions  . Other     NO BLOOD -JEHOVAH'S WITNESS-SIGNED REFUSAL BUT WOULD TAKE ALBUMIN    Family History  Problem Relation Age of Onset  . Hypertension Mother   . Prostate cancer Father   . Diabetes Sister     died with DM 11-Mar-2001, 2 more sisters with diabetes  . Heart failure Sister   . Diabetes Brother   . Diabetes Sister   . Diabetes Sister     BP 143/88 mmHg  Pulse 59  Temp(Src) 98.5 F (36.9 C) (Oral)  Ht 6\' 2"  (1.88 m)  Wt 267 lb (121.11 kg)  BMI 34.27 kg/m2  SpO2 98%    Review of Systems He has lost a few more lbs.      Objective:   Physical Exam VITAL SIGNS:  See vs page GENERAL: no distress Pulses: dorsalis pedis intact bilat.   MSK: no deformity of the feet CV: no leg edema Skin:  no ulcer on the feet.  normal color and temp on the feet. Neuro: sensation is intact to touch on the feet.    A1c=6.1%    Assessment & Plan:  DM: well-controlled Obesity: continues to improve after surgery.   Patient is advised the following: Patient Instructions  Please continue the same metformin. I would be happy to see you back here as needed.  check your blood sugar once a day.  vary the time of day when you check, between before the 3 meals, and at bedtime.  also check if you have symptoms of your blood sugar being too high or too low.  please keep a record of the readings and bring it to your next appointment here.  You can write it on any piece of paper.  please call us sooner if your blood sugar goes below 70, or if you have a lot of readings over 200.

## 2015-07-25 NOTE — Patient Instructions (Addendum)
Please continue the same metformin. I would be happy to see you back here as needed.  check your blood sugar once a day.  vary the time of day when you check, between before the 3 meals, and at bedtime.  also check if you have symptoms of your blood sugar being too high or too low.  please keep a record of the readings and bring it to your next appointment here.  You can write it on any piece of paper.  please call us sooner if your blood sugar goes below 70, or if you have a lot of readings over 200.

## 2015-07-29 ENCOUNTER — Ambulatory Visit (INDEPENDENT_AMBULATORY_CARE_PROVIDER_SITE_OTHER): Payer: PPO | Admitting: General Practice

## 2015-07-29 DIAGNOSIS — I2699 Other pulmonary embolism without acute cor pulmonale: Secondary | ICD-10-CM

## 2015-07-29 DIAGNOSIS — Z5181 Encounter for therapeutic drug level monitoring: Secondary | ICD-10-CM

## 2015-07-29 LAB — POCT INR: INR: 2.3

## 2015-07-29 NOTE — Progress Notes (Signed)
I have reviewed and agree with the plan. 

## 2015-07-30 ENCOUNTER — Telehealth: Payer: Self-pay | Admitting: *Deleted

## 2015-07-30 ENCOUNTER — Ambulatory Visit (HOSPITAL_COMMUNITY)
Admission: RE | Admit: 2015-07-30 | Discharge: 2015-07-30 | Disposition: A | Discharge status: Home / Self Care | Payer: PPO | Source: Ambulatory Visit | Attending: Cardiology | Admitting: Cardiology

## 2015-07-30 ENCOUNTER — Encounter (HOSPITAL_COMMUNITY): Payer: Self-pay

## 2015-07-30 DIAGNOSIS — I2510 Atherosclerotic heart disease of native coronary artery without angina pectoris: Secondary | ICD-10-CM | POA: Insufficient documentation

## 2015-07-30 DIAGNOSIS — R079 Chest pain, unspecified: Secondary | ICD-10-CM | POA: Insufficient documentation

## 2015-07-30 MED ORDER — IOHEXOL 350 MG/ML SOLN
80.0000 mL | Freq: Once | INTRAVENOUS | Status: AC | PRN
  Administered 2015-07-30: 80 mL via INTRAVENOUS

## 2015-07-30 MED ORDER — NITROGLYCERIN 0.4 MG SL SUBL
0.8000 mg | SUBLINGUAL_TABLET | Freq: Once | SUBLINGUAL | Status: AC
  Administered 2015-07-30: 0.8 mg via SUBLINGUAL
  Filled 2015-07-30: qty 25

## 2015-07-30 MED ORDER — NITROGLYCERIN 0.4 MG SL SUBL
SUBLINGUAL_TABLET | SUBLINGUAL | Status: AC
  Filled 2015-07-30: qty 2

## 2015-07-30 MED ORDER — METOPROLOL TARTRATE 1 MG/ML IV SOLN
INTRAVENOUS | Status: AC
Start: 2015-07-30 — End: 2015-07-30
  Filled 2015-07-30: qty 5

## 2015-07-30 MED ORDER — NITROGLYCERIN 0.4 MG SL SUBL: 0.4000 mg | SUBLINGUAL_TABLET | SUBLINGUAL | Status: AC | PRN

## 2015-07-30 MED ORDER — METOPROLOL TARTRATE 1 MG/ML IV SOLN
5.0000 mg | Freq: Once | INTRAVENOUS | Status: AC
  Administered 2015-07-30: 5 mg via INTRAVENOUS
  Filled 2015-07-30: qty 5

## 2015-07-30 NOTE — Telephone Encounter (Signed)
Notified the pt that per Dr Meda Coffee his coronary cta showed that he has moderate plaque, but nothing that requires a stent. Informed the pt that per Dr Meda Coffee he should continue on taking all of his current medications prescribed, he should be really strict with his diet, and start exercising on a regular basis.  Informed the pt that also per Dr Meda Coffee she wants to prescribe him sublingual nitroglycerin 0.4 mg to take only as needed for chest pain.  Went over thoroughly instructions on how the pt should take nitro and to follow the instructions on the bottle carefully.  Confirmed the pharmacy of choice with the pt.  Pt verbalized understanding and agrees with this plan.

## 2015-07-30 NOTE — Telephone Encounter (Signed)
-----   Message from Dorothy Spark, MD sent at 07/30/2015  6:15 PM EDT ----- He has moderate plaque, but nothing that requires a stent. He should continue taking all of his meds. He should be really strict on his diet and start exercising on a regular basis. Please prescribe him 0.4 sl NTG PRN for chest pain.

## 2015-08-05 ENCOUNTER — Other Ambulatory Visit: Payer: Self-pay

## 2015-08-05 MED ORDER — LISINOPRIL 5 MG PO TABS: 5.0000 mg | ORAL_TABLET | Freq: Every day | ORAL | Status: DC

## 2015-08-30 ENCOUNTER — Other Ambulatory Visit: Payer: Self-pay | Admitting: Internal Medicine

## 2015-09-03 ENCOUNTER — Ambulatory Visit: Payer: Self-pay

## 2015-09-10 ENCOUNTER — Ambulatory Visit: Payer: Self-pay

## 2015-09-10 ENCOUNTER — Other Ambulatory Visit: Payer: Self-pay | Admitting: Internal Medicine

## 2015-09-10 ENCOUNTER — Ambulatory Visit (INDEPENDENT_AMBULATORY_CARE_PROVIDER_SITE_OTHER): Payer: PPO | Admitting: *Deleted

## 2015-09-10 DIAGNOSIS — Z5181 Encounter for therapeutic drug level monitoring: Secondary | ICD-10-CM | POA: Diagnosis not present

## 2015-09-10 DIAGNOSIS — I2699 Other pulmonary embolism without acute cor pulmonale: Secondary | ICD-10-CM

## 2015-09-10 LAB — POCT INR: INR: 1.8

## 2015-09-10 NOTE — Progress Notes (Signed)
Pre visit review using our clinic review tool, if applicable. No additional management support is needed unless otherwise documented below in the visit note. 

## 2015-09-10 NOTE — Progress Notes (Signed)
I have reviewed and agree with the plan. 

## 2015-09-19 ENCOUNTER — Other Ambulatory Visit: Payer: Self-pay | Admitting: Endocrinology

## 2015-09-19 ENCOUNTER — Other Ambulatory Visit: Payer: Self-pay | Admitting: Internal Medicine

## 2015-09-30 DIAGNOSIS — M5400 Panniculitis affecting regions of neck and back, site unspecified: Secondary | ICD-10-CM | POA: Insufficient documentation

## 2015-10-08 ENCOUNTER — Ambulatory Visit (INDEPENDENT_AMBULATORY_CARE_PROVIDER_SITE_OTHER): Payer: PPO | Admitting: General Practice

## 2015-10-08 DIAGNOSIS — Z5181 Encounter for therapeutic drug level monitoring: Secondary | ICD-10-CM | POA: Diagnosis not present

## 2015-10-08 DIAGNOSIS — I2699 Other pulmonary embolism without acute cor pulmonale: Secondary | ICD-10-CM | POA: Diagnosis not present

## 2015-10-08 LAB — POCT INR: INR: 3

## 2015-10-08 NOTE — Progress Notes (Signed)
I have reviewed and agree with the plan. 

## 2015-10-08 NOTE — Progress Notes (Signed)
Pre visit review using our clinic review tool, if applicable. No additional management support is needed unless otherwise documented below in the visit note. 

## 2015-10-14 ENCOUNTER — Other Ambulatory Visit: Payer: Self-pay

## 2015-10-14 MED ORDER — PREGABALIN 50 MG PO CAPS: 50.0000 mg | ORAL_CAPSULE | Freq: Two times a day (BID) | ORAL | Status: DC

## 2015-10-14 NOTE — Telephone Encounter (Signed)
Done hardcopy to Dahlia  

## 2015-10-14 NOTE — Telephone Encounter (Signed)
Rx faxed to pharmacy  

## 2015-10-29 ENCOUNTER — Ambulatory Visit (INDEPENDENT_AMBULATORY_CARE_PROVIDER_SITE_OTHER): Payer: PPO | Admitting: Internal Medicine

## 2015-10-29 ENCOUNTER — Encounter: Payer: Self-pay | Admitting: Internal Medicine

## 2015-10-29 VITALS — BP 118/72 | HR 70 | Temp 97.8°F | Ht 74.0 in | Wt 262.0 lb

## 2015-10-29 DIAGNOSIS — I10 Essential (primary) hypertension: Secondary | ICD-10-CM | POA: Diagnosis not present

## 2015-10-29 DIAGNOSIS — R21 Rash and other nonspecific skin eruption: Secondary | ICD-10-CM | POA: Insufficient documentation

## 2015-10-29 DIAGNOSIS — E119 Type 2 diabetes mellitus without complications: Secondary | ICD-10-CM | POA: Diagnosis not present

## 2015-10-29 DIAGNOSIS — Z23 Encounter for immunization: Secondary | ICD-10-CM

## 2015-10-29 DIAGNOSIS — K090 Developmental odontogenic cysts: Secondary | ICD-10-CM | POA: Insufficient documentation

## 2015-10-29 MED ORDER — NYSTATIN 100000 UNIT/GM EX POWD: CUTANEOUS | Status: DC

## 2015-10-29 NOTE — Progress Notes (Signed)
Subjective:    Patient ID: [name], male    DOB: January 25, 1956, 59 y.o.   MRN: [id number]  HPI  Here with c/o recurring maceration to the inguinal tendon areas bilat with exercise, which he has been very diligent lately and lost considerable weight.  Feels like paper cuts that heal up, then recurs again with next exercise session.  Has mod pannus left over after wt loss, has seen plastic surgury and will likely have panniculectomy relatively soon.  Until then has contd rash recurring same area.  Pt denies chest pain, increased sob or doe, wheezing, orthopnea, PND, increased LE swelling, palpitations, dizziness or syncope.  Pt denies polydipsia, polyuria Past Medical History  Diagnosis Date  . Diabetes mellitus     type II  . Hyperlipidemia   . Hypertension   . DVT, lower extremity ([place])     right  . Embolus ([place]) 2005    pulmonary  . Depression   . Obesity   . BACK PAIN   . COLONIC POLYPS, HX OF   . DEGENERATIVE JOINT DISEASE, KNEES, BILATERAL   . DEPRESSION   . DIVERTICULOSIS, COLON   . HYPOGONADISM, MALE   . HYPOTENSION, ORTHOSTATIC   . OBESITY, MORBID   . Long term current use of anticoagulant   . Obstructive sleep apnea     uses cpap setting 8 to 10  . Kidney stones   . Renal insufficiency   . RENAL INSUFFICIENCY    Past Surgical History  Procedure Laterality Date  . Knee surgury  2000 & 2003     cartilage damage  . Ankle surgury  2001 bilateral    with bone spurs and torn tendon  . Edg  2008    chronic Duodenitis  . Tonsillectomy and adenoidectomy  age 68 or 58  . Total knee arthroplasty  01/10/2012    Procedure: TOTAL KNEE ARTHROPLASTY;  Surgeon: [name], MD;  Location: [facility];  Service: Orthopedics;  Laterality: Left;  . Lumbar laminectomy/decompression microdiscectomy  09/28/2012    Procedure: LUMBAR LAMINECTOMY/DECOMPRESSION MICRODISCECTOMY 2 LEVELS;  Surgeon: [name], MD;  Location: [facility];  Service: Orthopedics;  Laterality: Left;   Decompressive laminectomy L4-L5. Microdiscectomy L5-S1  . Heel spur surgery Bilateral 2005  . Replacement total knee  2013    reports that he quit smoking about 36 years ago. He has never used smokeless tobacco. He reports that he drinks alcohol. He reports that he does not use illicit drugs. family history includes Diabetes in his brother, sister, sister, and sister; Heart failure in his sister; Hypertension in his mother; Prostate cancer in his father. Allergies  Allergen Reactions  . Other     NO BLOOD -JEHOVAH'S WITNESS-SIGNED REFUSAL BUT WOULD TAKE ALBUMIN   Current Outpatient Prescriptions on File Prior to Visit  Medication Sig Dispense Refill  . albuterol (PROAIR HFA) 108 (90 BASE) MCG/ACT inhaler Inhale 2 puffs into the lungs every 4 (four) hours as needed. For shortness of breath 1 Inhaler 11  . aspirin (ECOTRIN LOW STRENGTH) 81 MG EC tablet Take 81 mg by mouth daily before breakfast.     . atorvastatin (LIPITOR) 80 MG tablet TAKE 1 TABLET (80 MG TOTAL) BY MOUTH DAILY. 90 tablet 0  . budesonide-formoterol (SYMBICORT) 80-4.5 MCG/ACT inhaler Inhale 2 puffs into the lungs 2 (two) times daily. 1 Inhaler 3  . escitalopram (LEXAPRO) 10 MG tablet TAKE 1 TABLET (10 MG TOTAL) BY MOUTH DAILY. 90 tablet 3  . hydrochlorothiazide (HYDRODIURIL) 25  MG tablet Take 1 tablet (25 mg total) by mouth daily. 30 tablet 11  . lisinopril (PRINIVIL,ZESTRIL) 5 MG tablet Take 1 tablet (5 mg total) by mouth daily. 90 tablet 1  . metFORMIN (GLUCOPHAGE) 500 MG tablet Take 1 tablet (500 mg total) by mouth daily with breakfast. 180 tablet 3  . nitroGLYCERIN (NITROSTAT) 0.4 MG SL tablet Place 1 tablet (0.4 mg total) under the tongue every 5 (five) minutes as needed for chest pain. 90 tablet 3  . omeprazole (PRILOSEC) 20 MG capsule Take 1 capsule (20 mg total) by mouth daily. 10 capsule 0  . pregabalin (LYRICA) 50 MG capsule Take 1 capsule (50 mg total) by mouth 2 (two) times daily. 60 capsule 5  . warfarin (COUMADIN)  5 MG tablet TAKE AS DIRECTED BY ANTICOAGULATION CLINIC 180 tablet 1   No current facility-administered medications on file prior to visit.   Review of Systems  Constitutional: Negative for unusual diaphoresis or night sweats HENT: Negative for ringing in ear or discharge Eyes: Negative for double vision or worsening visual disturbance.  Respiratory: Negative for choking and stridor.   Gastrointestinal: Negative for vomiting or other signifcant bowel change Genitourinary: Negative for hematuria or change in urine volume.  Musculoskeletal: Negative for other MSK pain or swelling Skin: Negative for color change and worsening wound.  Neurological: Negative for tremors and numbness other than noted  Psychiatric/Behavioral: Negative for decreased concentration or agitation other than above       Objective:   Physical Exam BP 118/72 mmHg  Pulse 70  Temp(Src) 97.8 F (36.6 C) (Oral)  Ht 6\' 2"  (1.88 m)  Wt 262 lb (118.842 kg)  BMI 33.62 kg/m2  SpO2 98% VS noted,  Constitutional: Pt appears in no significant distress HENT: Head: NCAT.  Right Ear: External ear normal.  Left Ear: External ear normal.  Eyes: . Pupils are equal, round, and reactive to light. Conjunctivae and EOM are normal Neck: Normal range of motion. Neck supple.  Cardiovascular: Normal rate and regular rhythm.   Pulmonary/Chest: Effort normal and breath sounds without rales or wheezing.  Abd:  Soft, NT, ND, + BS Neurological: Pt is alert. Not confused , motor grossly intact Skin: Skin is warm. No current rash to inguinal ligament areas bilat, no LE edema Psychiatric: Pt behavior is normal. No agitation.      Assessment & Plan:

## 2015-10-29 NOTE — Progress Notes (Signed)
Pre visit review using our clinic review tool, if applicable. No additional management support is needed unless otherwise documented below in the visit note. 

## 2015-10-29 NOTE — Assessment & Plan Note (Signed)
stable overall by history and exam, recent data reviewed with pt, and pt to continue medical treatment as before,  to f/u any worsening symptoms or concerns Lab Results  Component Value Date   HGBA1C 6.1 07/25/2015

## 2015-10-29 NOTE — Assessment & Plan Note (Signed)
stable overall by history and exam, recent data reviewed with pt, and pt to continue medical treatment as before,  to f/u any worsening symptoms or concerns BP Readings from Last 3 Encounters:  10/29/15 118/72  07/30/15 118/76  07/25/15 143/88

## 2015-10-29 NOTE — Assessment & Plan Note (Signed)
C/w maceration related to friction with exercise, wears boxer breifs already, for desitin cream with exercise barrier cream, then prn nystatin powder after

## 2015-10-29 NOTE — Patient Instructions (Signed)
You had the flu and shingles shots today  OK to use OTC Desitin barrier cream to the groin areas to help reduce friction during exercise  Please take all new medication as prescribed - the nystatin powder for after the gym workout for any fungal infection where the skin overlaps  Please continue all other medications as before, and refills have been done if requested.  Please have the pharmacy call with any other refills you may need.  Please continue your efforts at being more active, low cholesterol diet, and weight control.  Please keep your appointments with your specialists as you may have planned

## 2015-11-04 LAB — HM DIABETES EYE EXAM

## 2015-11-05 ENCOUNTER — Ambulatory Visit (INDEPENDENT_AMBULATORY_CARE_PROVIDER_SITE_OTHER): Payer: PPO | Admitting: General Practice

## 2015-11-05 DIAGNOSIS — Z5181 Encounter for therapeutic drug level monitoring: Secondary | ICD-10-CM

## 2015-11-05 DIAGNOSIS — I2699 Other pulmonary embolism without acute cor pulmonale: Secondary | ICD-10-CM | POA: Diagnosis not present

## 2015-11-05 LAB — POCT INR: INR: 2.2

## 2015-11-05 NOTE — Progress Notes (Signed)
Pre visit review using our clinic review tool, if applicable. No additional management support is needed unless otherwise documented below in the visit note. 

## 2015-11-05 NOTE — Progress Notes (Signed)
I have reviewed and agree with the plan. 

## 2015-11-06 ENCOUNTER — Encounter: Payer: Self-pay | Admitting: Endocrinology

## 2015-12-10 ENCOUNTER — Ambulatory Visit (INDEPENDENT_AMBULATORY_CARE_PROVIDER_SITE_OTHER): Payer: PPO | Admitting: General Practice

## 2015-12-10 DIAGNOSIS — Z5181 Encounter for therapeutic drug level monitoring: Secondary | ICD-10-CM | POA: Diagnosis not present

## 2015-12-10 DIAGNOSIS — I2699 Other pulmonary embolism without acute cor pulmonale: Secondary | ICD-10-CM

## 2015-12-10 LAB — POCT INR: INR: 2

## 2015-12-10 NOTE — Progress Notes (Signed)
Pre visit review using our clinic review tool, if applicable. No additional management support is needed unless otherwise documented below in the visit note. 

## 2015-12-10 NOTE — Progress Notes (Signed)
I have reviewed and agree with the plan. 

## 2015-12-19 ENCOUNTER — Other Ambulatory Visit: Payer: Self-pay | Admitting: Endocrinology

## 2015-12-21 ENCOUNTER — Encounter (HOSPITAL_COMMUNITY): Payer: Self-pay | Admitting: Emergency Medicine

## 2015-12-21 ENCOUNTER — Emergency Department (HOSPITAL_COMMUNITY): Payer: PPO

## 2015-12-21 ENCOUNTER — Emergency Department (HOSPITAL_COMMUNITY)
Admission: EM | Admit: 2015-12-21 | Discharge: 2015-12-22 | Disposition: A | Discharge status: Home / Self Care | Payer: PPO | Attending: Emergency Medicine | Admitting: Emergency Medicine

## 2015-12-21 DIAGNOSIS — Z8719 Personal history of other diseases of the digestive system: Secondary | ICD-10-CM | POA: Insufficient documentation

## 2015-12-21 DIAGNOSIS — G4733 Obstructive sleep apnea (adult) (pediatric): Secondary | ICD-10-CM | POA: Insufficient documentation

## 2015-12-21 DIAGNOSIS — R112 Nausea with vomiting, unspecified: Secondary | ICD-10-CM | POA: Diagnosis not present

## 2015-12-21 DIAGNOSIS — F329 Major depressive disorder, single episode, unspecified: Secondary | ICD-10-CM | POA: Insufficient documentation

## 2015-12-21 DIAGNOSIS — Z87891 Personal history of nicotine dependence: Secondary | ICD-10-CM | POA: Diagnosis not present

## 2015-12-21 DIAGNOSIS — E119 Type 2 diabetes mellitus without complications: Secondary | ICD-10-CM | POA: Insufficient documentation

## 2015-12-21 DIAGNOSIS — Z7982 Long term (current) use of aspirin: Secondary | ICD-10-CM | POA: Insufficient documentation

## 2015-12-21 DIAGNOSIS — Z7984 Long term (current) use of oral hypoglycemic drugs: Secondary | ICD-10-CM | POA: Insufficient documentation

## 2015-12-21 DIAGNOSIS — Z79899 Other long term (current) drug therapy: Secondary | ICD-10-CM | POA: Diagnosis not present

## 2015-12-21 DIAGNOSIS — Z8739 Personal history of other diseases of the musculoskeletal system and connective tissue: Secondary | ICD-10-CM | POA: Insufficient documentation

## 2015-12-21 DIAGNOSIS — E785 Hyperlipidemia, unspecified: Secondary | ICD-10-CM | POA: Insufficient documentation

## 2015-12-21 DIAGNOSIS — I10 Essential (primary) hypertension: Secondary | ICD-10-CM | POA: Insufficient documentation

## 2015-12-21 DIAGNOSIS — Z7901 Long term (current) use of anticoagulants: Secondary | ICD-10-CM | POA: Insufficient documentation

## 2015-12-21 DIAGNOSIS — Z87442 Personal history of urinary calculi: Secondary | ICD-10-CM | POA: Diagnosis not present

## 2015-12-21 DIAGNOSIS — Z9889 Other specified postprocedural states: Secondary | ICD-10-CM | POA: Insufficient documentation

## 2015-12-21 DIAGNOSIS — K668 Other specified disorders of peritoneum: Secondary | ICD-10-CM

## 2015-12-21 DIAGNOSIS — E669 Obesity, unspecified: Secondary | ICD-10-CM | POA: Diagnosis not present

## 2015-12-21 DIAGNOSIS — Z87448 Personal history of other diseases of urinary system: Secondary | ICD-10-CM | POA: Insufficient documentation

## 2015-12-21 DIAGNOSIS — Z86718 Personal history of other venous thrombosis and embolism: Secondary | ICD-10-CM | POA: Diagnosis not present

## 2015-12-21 DIAGNOSIS — Z7952 Long term (current) use of systemic steroids: Secondary | ICD-10-CM | POA: Diagnosis not present

## 2015-12-21 DIAGNOSIS — Z86711 Personal history of pulmonary embolism: Secondary | ICD-10-CM | POA: Insufficient documentation

## 2015-12-21 DIAGNOSIS — K529 Noninfective gastroenteritis and colitis, unspecified: Secondary | ICD-10-CM | POA: Insufficient documentation

## 2015-12-21 DIAGNOSIS — Z86010 Personal history of colonic polyps: Secondary | ICD-10-CM | POA: Insufficient documentation

## 2015-12-21 DIAGNOSIS — R109 Unspecified abdominal pain: Secondary | ICD-10-CM | POA: Diagnosis not present

## 2015-12-21 LAB — COMPREHENSIVE METABOLIC PANEL
ALT: 29 U/L (ref 17–63)
AST: 30 U/L (ref 15–41)
Albumin: 4 g/dL (ref 3.5–5.0)
Alkaline Phosphatase: 78 U/L (ref 38–126)
Anion gap: 12 (ref 5–15)
BUN: 17 mg/dL (ref 6–20)
CHLORIDE: 106 mmol/L (ref 101–111)
CO2: 23 mmol/L (ref 22–32)
CREATININE: 0.93 mg/dL (ref 0.61–1.24)
Calcium: 8.8 mg/dL — ABNORMAL LOW (ref 8.9–10.3)
GFR calc non Af Amer: >60 mL/min (ref >60)
Glucose, Bld: 138 mg/dL — ABNORMAL HIGH (ref 65–99)
POTASSIUM: 3.6 mmol/L (ref 3.5–5.1)
SODIUM: 141 mmol/L (ref 135–145)
Total Bilirubin: 1 mg/dL (ref 0.3–1.2)
Total Protein: 6.7 g/dL (ref 6.5–8.1)

## 2015-12-21 LAB — I-STAT CG4 LACTIC ACID, ED
LACTIC ACID, VENOUS: 2.88 mmol/L — AB (ref 0.5–2.0)
LACTIC ACID, VENOUS: 1.69 mmol/L (ref 0.5–2.0)

## 2015-12-21 LAB — CBC
HEMATOCRIT: 44.7 % (ref 39.0–52.0)
Hemoglobin: 15 g/dL (ref 13.0–17.0)
MCH: 23.3 pg — AB (ref 26.0–34.0)
MCHC: 33.6 g/dL (ref 30.0–36.0)
MCV: 69.5 fL — AB (ref 78.0–100.0)
Platelets: 178 10*3/uL (ref 150–400)
RBC: 6.43 MIL/uL — AB (ref 4.22–5.81)
RDW: 15.8 % — ABNORMAL HIGH (ref 11.5–15.5)
WBC: 8.5 10*3/uL (ref 4.0–10.5)

## 2015-12-21 LAB — PROTIME-INR
INR: 2.17 — ABNORMAL HIGH (ref 0.00–1.49)
Prothrombin Time: 24 seconds — ABNORMAL HIGH (ref 11.6–15.2)

## 2015-12-21 LAB — LIPASE, BLOOD: Lipase: 18 U/L (ref 11–51)

## 2015-12-21 MED ORDER — IOHEXOL 300 MG/ML  SOLN
100.0000 mL | Freq: Once | INTRAMUSCULAR | Status: AC | PRN
  Administered 2015-12-21: 100 mL via INTRAVENOUS

## 2015-12-21 MED ORDER — IOHEXOL 300 MG/ML  SOLN: 25.0000 mL | Freq: Once | INTRAMUSCULAR | Status: DC | PRN

## 2015-12-21 MED ORDER — HYDROMORPHONE HCL 1 MG/ML IJ SOLN
1.0000 mg | Freq: Once | INTRAMUSCULAR | Status: AC
  Administered 2015-12-21: 1 mg via INTRAVENOUS
  Filled 2015-12-21: qty 1

## 2015-12-21 MED ORDER — ONDANSETRON 4 MG PO TBDP
ORAL_TABLET | ORAL | Status: AC
  Filled 2015-12-21: qty 1

## 2015-12-21 MED ORDER — ONDANSETRON 4 MG PO TBDP
4.0000 mg | ORAL_TABLET | Freq: Once | ORAL | Status: AC
  Administered 2015-12-21: 4 mg via ORAL

## 2015-12-21 MED ORDER — ONDANSETRON HCL 4 MG/2ML IJ SOLN
4.0000 mg | Freq: Once | INTRAMUSCULAR | Status: AC
Start: 2015-12-21 — End: 2015-12-21
  Administered 2015-12-21: 4 mg via INTRAVENOUS
  Filled 2015-12-21: qty 2

## 2015-12-21 MED ORDER — SODIUM CHLORIDE 0.9 % IV BOLUS (SEPSIS)
1000.0000 mL | Freq: Once | INTRAVENOUS | Status: AC
  Administered 2015-12-21: 1000 mL via INTRAVENOUS

## 2015-12-21 NOTE — ED Notes (Signed)
Pt vomiting mod amt of red blood, pt received ODT zofran, pts vitals reassessed, traige RN Santiago Glad made aware of emesis

## 2015-12-21 NOTE — ED Notes (Signed)
Spoke with main lab,CMP should result in 10 mins. Pt updated.

## 2015-12-21 NOTE — ED Notes (Signed)
Called mini lab to check status of CMp Lipase, lab tech stated that there was not enough volume in the tube. Asked lab tech if he had called someone to inform them and he said he did but could not find any record of communication. Attempted to redraw x 1 without success. Pleb not at bedside. Pt updated.

## 2015-12-21 NOTE — ED Notes (Signed)
C/o sudden onset of sharp mid abd pain while eating a salad 1 hour ago.  Vomited x 3.  Last BM this morning- WNL.

## 2015-12-22 ENCOUNTER — Encounter: Payer: Self-pay | Admitting: Internal Medicine

## 2015-12-22 ENCOUNTER — Other Ambulatory Visit (INDEPENDENT_AMBULATORY_CARE_PROVIDER_SITE_OTHER): Payer: PPO

## 2015-12-22 ENCOUNTER — Ambulatory Visit (INDEPENDENT_AMBULATORY_CARE_PROVIDER_SITE_OTHER): Payer: PPO | Admitting: Internal Medicine

## 2015-12-22 VITALS — BP 118/80 | HR 65 | Temp 98.3°F | Resp 20 | Ht 74.0 in | Wt 261.5 lb

## 2015-12-22 DIAGNOSIS — Z0000 Encounter for general adult medical examination without abnormal findings: Secondary | ICD-10-CM

## 2015-12-22 DIAGNOSIS — I10 Essential (primary) hypertension: Secondary | ICD-10-CM | POA: Diagnosis not present

## 2015-12-22 DIAGNOSIS — E119 Type 2 diabetes mellitus without complications: Secondary | ICD-10-CM

## 2015-12-22 DIAGNOSIS — R109 Unspecified abdominal pain: Secondary | ICD-10-CM

## 2015-12-22 LAB — BASIC METABOLIC PANEL
BUN: 16 mg/dL (ref 6–23)
CHLORIDE: 102 meq/L (ref 96–112)
CO2: 26 mEq/L (ref 19–32)
Calcium: 9.4 mg/dL (ref 8.4–10.5)
Creatinine, Ser: 0.99 mg/dL (ref 0.40–1.50)
GFR: 98.51 mL/min (ref >60.00)
GLUCOSE: 103 mg/dL — AB (ref 70–99)
POTASSIUM: 4.3 meq/L (ref 3.5–5.1)
Sodium: 143 mEq/L (ref 135–145)

## 2015-12-22 LAB — URINALYSIS, ROUTINE W REFLEX MICROSCOPIC
Bilirubin Urine: NEGATIVE
HGB URINE DIPSTICK: NEGATIVE
KETONES UR: NEGATIVE
LEUKOCYTES UA: NEGATIVE
NITRITE: NEGATIVE
Specific Gravity, Urine: >=1.03 — AB (ref 1.000–1.030)
Total Protein, Urine: NEGATIVE
URINE GLUCOSE: NEGATIVE
UROBILINOGEN UA: 0.2 (ref 0.0–1.0)
pH: 5 (ref 5.0–8.0)

## 2015-12-22 LAB — CBC WITH DIFFERENTIAL/PLATELET
BASOS ABS: 0 10*3/uL (ref 0.0–0.1)
Basophils Relative: 0.3 % (ref 0.0–3.0)
EOS ABS: 0 10*3/uL (ref 0.0–0.7)
Eosinophils Relative: 0.3 % (ref 0.0–5.0)
HCT: 43.6 % (ref 39.0–52.0)
Hemoglobin: 13.4 g/dL (ref 13.0–17.0)
LYMPHS ABS: 1.6 10*3/uL (ref 0.7–4.0)
Lymphocytes Relative: 20.5 % (ref 12.0–46.0)
MCHC: 30.9 g/dL (ref 30.0–36.0)
MCV: 71.8 fl — ABNORMAL LOW (ref 78.0–100.0)
Monocytes Absolute: 0.7 10*3/uL (ref 0.1–1.0)
Monocytes Relative: 8.4 % (ref 3.0–12.0)
NEUTROS ABS: 5.6 10*3/uL (ref 1.4–7.7)
NEUTROS PCT: 70.5 % (ref 43.0–77.0)
PLATELETS: 178 10*3/uL (ref 150.0–400.0)
RBC: 6.07 Mil/uL — ABNORMAL HIGH (ref 4.22–5.81)
RDW: 16.6 % — ABNORMAL HIGH (ref 11.5–15.5)
WBC: 8 10*3/uL (ref 4.0–10.5)

## 2015-12-22 LAB — HEPATIC FUNCTION PANEL
ALBUMIN: 4.3 g/dL (ref 3.5–5.2)
ALK PHOS: 82 U/L (ref 39–117)
ALT: 23 U/L (ref 0–53)
AST: 20 U/L (ref 0–37)
BILIRUBIN TOTAL: 0.9 mg/dL (ref 0.2–1.2)
Bilirubin, Direct: 0.2 mg/dL (ref 0.0–0.3)
Total Protein: 7.1 g/dL (ref 6.0–8.3)

## 2015-12-22 LAB — LIPID PANEL
CHOLESTEROL: 131 mg/dL (ref 0–200)
HDL: 50.5 mg/dL (ref >39.00)
LDL Cholesterol: 66 mg/dL (ref 0–99)
NonHDL: 80.76
TRIGLYCERIDES: 75 mg/dL (ref 0.0–149.0)
Total CHOL/HDL Ratio: 3
VLDL: 15 mg/dL (ref 0.0–40.0)

## 2015-12-22 LAB — MICROALBUMIN / CREATININE URINE RATIO
CREATININE, U: 225.6 mg/dL
MICROALB UR: 2 mg/dL — AB (ref 0.0–1.9)
Microalb Creat Ratio: 0.9 mg/g (ref 0.0–30.0)

## 2015-12-22 LAB — HEMOGLOBIN A1C: Hgb A1c MFr Bld: 6.6 % — ABNORMAL HIGH (ref 4.6–6.5)

## 2015-12-22 LAB — TSH: TSH: 1.44 u[IU]/mL (ref 0.35–4.50)

## 2015-12-22 MED ORDER — CIPROFLOXACIN HCL 500 MG PO TABS
500.0000 mg | ORAL_TABLET | Freq: Once | ORAL | Status: AC
  Administered 2015-12-22: 500 mg via ORAL
  Filled 2015-12-22: qty 1

## 2015-12-22 MED ORDER — ONDANSETRON HCL 4 MG PO TABS
4.0000 mg | ORAL_TABLET | Freq: Once | ORAL | Status: AC
  Administered 2015-12-22: 4 mg via ORAL
  Filled 2015-12-22: qty 1

## 2015-12-22 MED ORDER — PROMETHAZINE HCL 25 MG PO TABS: 25.0000 mg | ORAL_TABLET | Freq: Three times a day (TID) | ORAL | Status: DC | PRN

## 2015-12-22 MED ORDER — ONDANSETRON HCL 4 MG PO TABS: 4.0000 mg | ORAL_TABLET | Freq: Two times a day (BID) | ORAL | Status: DC | PRN

## 2015-12-22 MED ORDER — METRONIDAZOLE 500 MG PO TABS
500.0000 mg | ORAL_TABLET | Freq: Once | ORAL | Status: AC
  Administered 2015-12-22: 500 mg via ORAL
  Filled 2015-12-22: qty 1

## 2015-12-22 MED ORDER — METRONIDAZOLE 500 MG PO TABS: 500.0000 mg | ORAL_TABLET | Freq: Three times a day (TID) | ORAL | Status: DC

## 2015-12-22 MED ORDER — HYDROCODONE-ACETAMINOPHEN 5-325 MG PO TABS
1.0000 | ORAL_TABLET | Freq: Once | ORAL | Status: AC
  Administered 2015-12-22: 1 via ORAL
  Filled 2015-12-22: qty 1

## 2015-12-22 MED ORDER — CIPROFLOXACIN HCL 250 MG PO TABS: 500.0000 mg | ORAL_TABLET | Freq: Two times a day (BID) | ORAL | Status: AC

## 2015-12-22 MED ORDER — HYDROCODONE-ACETAMINOPHEN 5-325 MG PO TABS: 1.0000 | ORAL_TABLET | ORAL | Status: DC | PRN

## 2015-12-22 NOTE — Discharge Instructions (Signed)
°  Colitis °Colitis is inflammation of the colon. Colitis may last a short time (acute) or it may last a long time (chronic). °CAUSES °This condition may be caused by: °· Viruses. °· Bacteria. °· Reactions to medicine. °· Certain autoimmune diseases, such as Crohn disease or ulcerative colitis. °SYMPTOMS °Symptoms of this condition include: °· Diarrhea. °· Passing bloody or tarry stool. °· Pain. °· Fever. °· Vomiting. °· Tiredness (fatigue). °· Weight loss. °· Bloating. °· Sudden increase in abdominal pain. °· Having fewer bowel movements than usual. °DIAGNOSIS °This condition is diagnosed with a stool test or a blood test. You may also have other tests, including X-rays, a CT scan, or a colonoscopy. °TREATMENT °Treatment may include: °· Resting the bowel. This involves not eating or drinking for a period of time. °· Fluids that are given through an IV tube. °· Medicine for pain and diarrhea. °· Antibiotic medicines. °· Cortisone medicines. °· Surgery. °HOME CARE INSTRUCTIONS °Eating and Drinking °· Follow instructions from your health care provider about eating or drinking restrictions. °· Drink enough fluid to keep your urine clear or pale yellow. °· Work with a dietitian to determine which foods cause your condition to flare up. °· Avoid foods that cause flare-ups. °· Eat a well-balanced diet. °Medicines °· Take over-the-counter and prescription medicines only as told by your health care provider. °· If you were prescribed an antibiotic medicine, take it as told by your health care provider. Do not stop taking the antibiotic even if you start to feel better. °General Instructions °· Keep all follow-up visits as told by your health care provider. This is important. °SEEK MEDICAL CARE IF: °· Your symptoms do not go away. °· You develop new symptoms. °SEEK IMMEDIATE MEDICAL CARE IF: °· You have a fever that does not go away with treatment. °· You develop chills. °· You have extreme weakness, fainting, or  dehydration. °· You have repeated vomiting. °· You develop severe pain in your abdomen. °· You pass bloody or tarry stool. °  °This information is not intended to replace advice given to you by your health care provider. Make sure you discuss any questions you have with your health care provider. °  °Document Released: 12/30/2004 Document Revised: 08/13/2015 Document Reviewed: 03/17/2015 °[employer]vier Interactive Patient Education ©2016 [employer] ° °

## 2015-12-22 NOTE — Progress Notes (Signed)
Subjective:    Patient ID: [name], male    DOB: 1956/09/03, 60 y.o.   MRN: [id number]  HPI  Here for wellness and f/u;  Overall doing ok;  Pt denies Chest pain, worsening SOB, DOE, wheezing, orthopnea, PND, worsening LE edema, palpitations, dizziness or syncope.  Pt denies neurological change such as new headache, facial or extremity weakness.  Pt denies polydipsia, polyuria, or low sugar symptoms. Pt states overall good compliance with treatment and medications, good tolerability, and has been trying to follow appropriate diet.  Pt denies worsening depressive symptoms, suicidal ideation or panic. No fever, night sweats, wt loss, loss of appetite, or other constitutional symptoms.  Pt states good ability with ADL's, has low fall risk, home safety reviewed and adequate, no other significant changes in hearing or vision, and only occasionally active with exercise.   Seen in ED with n/v, abd pain (no diarrhea, fever, blood). ED note apparently not yet finished, but able to tell from EMR Rx cipro/flagyl/zofran and pain med, but could not get the zofran due to PA needed.  Had CT abd/Pelvic exam:   Pain overall improved, no further vomiting overnight.  Incidentally has appt for EGD next wk planned at [place]: 1. Diffuse mesenteric edema involving the mesentery of the jejunum and proximal ileum, with associated mild wall thickening, concerning for acute infectious or inflammatory jejunitis. This extends distally from the level of the patient's gastrojejunal anastomosis. 2. Visualized vasculature appears grossly intact. No definite evidence for bowel ischemia. 3. Small amount of free fluid noted tracking about the liver and spleen, and small amount of free fluid in the pelvis. 4. Scattered calcification along the abdominal aorta and its branches. 5. Scattered diverticulosis along the descending colon, without evidence of diverticulitis. 6. Mildly enlarged prostate noted.  Also  ? Mild vascular congestion by cxr -  IMPRESSION: 1. Mild vascular congestion noted. Lungs remain grossly clear. 2. Distention of a small bowel loop to 4.9 cm in maximal diameter, with question of mild mucosal thickening. No definite evidence for bowel obstruction. This may reflect some degree of infectious or inflammatory change. No free intra-abdominal air seen. Pt denies chest pain, increased sob or doe, wheezing, orthopnea, PND, increased LE swelling, palpitations, dizziness or syncope.  Past Medical History  Diagnosis Date  . Diabetes mellitus     type II  . Hyperlipidemia   . Hypertension   . DVT, lower extremity ([place])     right  . Embolus ([place]) 2005    pulmonary  . Depression   . Obesity   . BACK PAIN   . COLONIC POLYPS, HX OF   . DEGENERATIVE JOINT DISEASE, KNEES, BILATERAL   . DEPRESSION   . DIVERTICULOSIS, COLON   . HYPOGONADISM, MALE   . HYPOTENSION, ORTHOSTATIC   . OBESITY, MORBID   . Long term current use of anticoagulant   . Obstructive sleep apnea     uses cpap setting 8 to 10  . Kidney stones   . Renal insufficiency   . RENAL INSUFFICIENCY    Past Surgical History  Procedure Laterality Date  . Knee surgury  2000 & 2003     cartilage damage  . Ankle surgury  2001 bilateral    with bone spurs and torn tendon  . Edg  2008    chronic Duodenitis  . Tonsillectomy and adenoidectomy  age 45 or 77  . Total knee arthroplasty  01/10/2012    Procedure: TOTAL KNEE ARTHROPLASTY;  Surgeon: Rodman Key  Marian Sorrow, MD;  Location: [facility];  Service: Orthopedics;  Laterality: Left;  . Lumbar laminectomy/decompression microdiscectomy  09/28/2012    Procedure: LUMBAR LAMINECTOMY/DECOMPRESSION MICRODISCECTOMY 2 LEVELS;  Surgeon: [name], MD;  Location: [facility];  Service: Orthopedics;  Laterality: Left;  Decompressive laminectomy L4-L5. Microdiscectomy L5-S1  . Heel spur surgery Bilateral 2005  . Replacement total knee  2013  . Gastric bypass      reports that he quit  smoking about 37 years ago. He has never used smokeless tobacco. He reports that he drinks alcohol. He reports that he does not use illicit drugs. family history includes Diabetes in his brother, sister, sister, and sister; Heart failure in his sister; Hypertension in his mother; Prostate cancer in his father. Allergies  Allergen Reactions  . Other     NO BLOOD -JEHOVAH'S WITNESS-SIGNED REFUSAL BUT WOULD TAKE ALBUMIN   Current Outpatient Prescriptions on File Prior to Visit  Medication Sig Dispense Refill  . albuterol (PROAIR HFA) 108 (90 BASE) MCG/ACT inhaler Inhale 2 puffs into the lungs every 4 (four) hours as needed. For shortness of breath 1 Inhaler 11  . aspirin EC 81 MG tablet Take 81 mg by mouth daily.    Marland Kitchen atorvastatin (LIPITOR) 80 MG tablet TAKE 1 TABLET (80 MG TOTAL) BY MOUTH DAILY. 90 tablet 0  . budesonide-formoterol (SYMBICORT) 80-4.5 MCG/ACT inhaler Inhale 2 puffs into the lungs 2 (two) times daily. 1 Inhaler 3  . Calcium Carbonate (CALCI-CHEW PO) Take 1 tablet by mouth 3 (three) times daily.    . ciprofloxacin (CIPRO) 250 MG tablet Take 2 tablets (500 mg total) by mouth every 12 (twelve) hours. 28 tablet 0  . Cyanocobalamin (VITAMIN B-12 SL) Place 1 tablet under the tongue daily.    Marland Kitchen escitalopram (LEXAPRO) 10 MG tablet TAKE 1 TABLET (10 MG TOTAL) BY MOUTH DAILY. 90 tablet 3  . Ferrous Sulfate (IRON) 90 (18 Fe) MG TABS Take 18 mg by mouth daily.    . hydrochlorothiazide (HYDRODIURIL) 25 MG tablet Take 1 tablet (25 mg total) by mouth daily. 30 tablet 11  . HYDROcodone-acetaminophen (NORCO/VICODIN) 5-325 MG tablet Take 1 tablet by mouth every 4 (four) hours as needed. 20 tablet 0  . lisinopril (PRINIVIL,ZESTRIL) 5 MG tablet Take 1 tablet (5 mg total) by mouth daily. 90 tablet 1  . metFORMIN (GLUCOPHAGE) 500 MG tablet Take 1 tablet (500 mg total) by mouth daily with breakfast. 180 tablet 3  . metFORMIN (GLUCOPHAGE) 500 MG tablet TAKE 1 TABLET (500 MG TOTAL) BY MOUTH 2 (TWO) TIMES  DAILY WITH A MEAL. 180 tablet 0  . Multiple Vitamin (MULTIVITAMIN WITH MINERALS) TABS tablet Take 1 tablet by mouth daily.    . nitroGLYCERIN (NITROSTAT) 0.4 MG SL tablet Place 1 tablet (0.4 mg total) under the tongue every 5 (five) minutes as needed for chest pain. 90 tablet 3  . nystatin (MYCOSTATIN) powder Use as directed up to twice per day as needed (Patient taking differently: Apply topically 2 (two) times daily as needed (rash/ skin irritation). ) 60 g 5  . omeprazole (PRILOSEC) 20 MG capsule Take 1 capsule (20 mg total) by mouth daily. 10 capsule 0  . ondansetron (ZOFRAN) 4 MG tablet Take 1 tablet (4 mg total) by mouth every 12 (twelve) hours as needed for nausea or vomiting. 12 tablet 0  . pregabalin (LYRICA) 50 MG capsule Take 1 capsule (50 mg total) by mouth 2 (two) times daily. (Patient taking differently: Take 50 mg by mouth daily after  supper. ) 60 capsule 5  . ranitidine (ZANTAC) 150 MG tablet Take 150 mg by mouth 2 (two) times daily.  3  . senna-docusate (SENNA PLUS) 8.6-50 MG tablet Take 1 tablet by mouth daily as needed for mild constipation.    Marland Kitchen warfarin (COUMADIN) 5 MG tablet TAKE AS DIRECTED BY ANTICOAGULATION CLINIC (Patient taking differently: TAKE 3 TABLETS BY MOUTH ON TUESDAY AND FRIDAY, TAKE 2 TABLETS ON ALL OTHER DAYS OR AS DIRECTED BY ANTICOAGULATION CLINIC) 180 tablet 1   No current facility-administered medications on file prior to visit.     Review of Systems Constitutional: Negative for increased diaphoresis, other activity, appetite or siginficant weight change other than noted HENT: Negative for worsening hearing loss, ear pain, facial swelling, mouth sores and neck stiffness.   Eyes: Negative for other worsening pain, redness or visual disturbance.  Respiratory: Negative for shortness of breath and wheezing  Cardiovascular: Negative for chest pain and palpitations.  Gastrointestinal: Negative for diarrhea, blood in stool, abdominal distention or other  pain Genitourinary: Negative for hematuria, flank pain or change in urine volume.  Musculoskeletal: Negative for myalgias or other joint complaints.  Skin: Negative for color change and wound or drainage.  Neurological: Negative for syncope and numbness. other than noted Hematological: Negative for adenopathy. or other swelling Psychiatric/Behavioral: Negative for hallucinations, SI, self-injury, decreased concentration or other worsening agitation.      Objective:   Physical Exam BP 118/80 mmHg  Pulse 65  Temp(Src) 98.3 F (36.8 C) (Oral)  Resp 20  Ht 6\' 2"  (1.88 m)  Wt 261 lb 8 oz (118.616 kg)  BMI 33.56 kg/m2  SpO2 96% VS noted, non toxic Constitutional: Pt is oriented to person, place, and time. Appears well-developed and well-nourished, in no significant distress Head: Normocephalic and atraumatic.  Right Ear: External ear normal.  Left Ear: External ear normal.  Nose: Nose normal.  Mouth/Throat: Oropharynx is clear and moist.  Eyes: Conjunctivae and EOM are normal. Pupils are equal, round, and reactive to light.  Neck: Normal range of motion. Neck supple. No JVD present. No tracheal deviation present or significant neck LA or mass Cardiovascular: Normal rate, regular rhythm, normal heart sounds and intact distal pulses.   Pulmonary/Chest: Effort normal and breath sounds without rales or wheezing  Abdominal: Soft. Bowel sounds are normal. . No HSM Mild mid abd tender, no guarding or rebound  Musculoskeletal: Normal range of motion. Exhibits no edema.  Lymphadenopathy:  Has no cervical adenopathy.  Neurological: Pt is alert and oriented to person, place, and time. Pt has normal reflexes. No cranial nerve deficit. Motor grossly intact Skin: Skin is warm and dry. No rash noted.  Psychiatric:  Has fatigued mood and affect. Behavior is normal.     Assessment & Plan:

## 2015-12-22 NOTE — Patient Instructions (Signed)
Please take all new medication as prescribed- the phenergan, instead of the zofran  Please continue all other medications as before, and refills have been done if requested.  Please have the pharmacy call with any other refills you may need.  Please continue your efforts at being more active, low cholesterol diet, and weight control.  You are otherwise up to date with prevention measures today.  Please keep your appointments with your specialists as you may have planned  Please go to the LAB in the Basement (turn left off the elevator) for the tests to be done today  You will be contacted by phone if any changes need to be made immediately.  Otherwise, you will receive a letter about your results with an explanation, but please check with MyChart first.  Please remember to sign up for MyChart if you have not done so, as this will be important to you in the future with finding out test results, communicating by private email, and scheduling acute appointments online when needed.  Please return in 6 months, or sooner if needed, with Lab testing done 3-5 days before

## 2015-12-22 NOTE — ED Provider Notes (Signed)
CSN: [id number]     Arrival date & time 12/21/15  1719 History   First MD Initiated Contact with Patient 12/21/15 1831     Chief Complaint  Patient presents with  . Abdominal Pain     (Consider location/radiation/quality/duration/timing/severity/associated sxs/prior Treatment) HPI    60 y M w PMH DM, HL, HTN, prev gastric bypass, who comes in with left sided abdominal pain associated w/ mult episodes of NBNB emesis since today.  No diarrhea.  No fever/chills.  The abdominal pain is mostly L sided and severe w/o alleviating aggravating fx.  He describes the pain as sharp.  No chest pain/sob.  No dysuria.  Last bowel movement within the past day.  Past Medical History  Diagnosis Date  . Diabetes mellitus     type II  . Hyperlipidemia   . Hypertension   . DVT, lower extremity ([place])     right  . Embolus ([place]) 05-Apr-2004    pulmonary  . Depression   . Obesity   . BACK PAIN   . COLONIC POLYPS, HX OF   . DEGENERATIVE JOINT DISEASE, KNEES, BILATERAL   . DEPRESSION   . DIVERTICULOSIS, COLON   . HYPOGONADISM, MALE   . HYPOTENSION, ORTHOSTATIC   . OBESITY, MORBID   . Long term current use of anticoagulant   . Obstructive sleep apnea     uses cpap setting 8 to 10  . Kidney stones   . Renal insufficiency   . RENAL INSUFFICIENCY    Past Surgical History  Procedure Laterality Date  . Knee surgury  04/06/1999 & 2002/04/05     cartilage damage  . Ankle surgury  04-05-2000 bilateral    with bone spurs and torn tendon  . Edg  04-06-2007    chronic Duodenitis  . Tonsillectomy and adenoidectomy  age 72 or 50  . Total knee arthroplasty  01/10/2012    Procedure: TOTAL KNEE ARTHROPLASTY;  Surgeon: [name], MD;  Location: [facility];  Service: Orthopedics;  Laterality: Left;  . Lumbar laminectomy/decompression microdiscectomy  09/28/2012    Procedure: LUMBAR LAMINECTOMY/DECOMPRESSION MICRODISCECTOMY 2 LEVELS;  Surgeon: [name], MD;  Location: [facility];  Service: Orthopedics;  Laterality: Left;  Decompressive  laminectomy L4-L5. Microdiscectomy L5-S1  . Heel spur surgery Bilateral 2004/04/05  . Replacement total knee  05-Apr-2012  . Gastric bypass     Family History  Problem Relation Age of Onset  . Hypertension Mother   . Prostate cancer Father   . Diabetes Sister     died with DM 05-Apr-2001, 2 more sisters with diabetes  . Heart failure Sister   . Diabetes Brother   . Diabetes Sister   . Diabetes Sister    Social History  Substance Use Topics  . Smoking status: Former Smoker -- 0.25 packs/day for 5 years    Quit date: 12/06/1978  . Smokeless tobacco: Never Used  . Alcohol Use: 0.0 oz/week    0 Standard drinks or equivalent per week     Comment: occasional    Review of Systems  Constitutional: Negative for fever and chills.  Eyes: Negative for redness.  Respiratory: Negative for cough and shortness of breath.   Cardiovascular: Negative for chest pain.  Gastrointestinal: Positive for nausea, vomiting and abdominal pain. Negative for diarrhea.  Genitourinary: Negative for dysuria.  Skin: Negative for rash.  Neurological: Negative for headaches.  All other systems reviewed and are negative.     Allergies  Other  Home Medications   Prior to  Admission medications   Medication Sig Start Date End Date Taking? Authorizing Provider  aspirin EC 81 MG tablet Take 81 mg by mouth daily.   Yes Historical Provider, MD  atorvastatin (LIPITOR) 80 MG tablet TAKE 1 TABLET (80 MG TOTAL) BY MOUTH DAILY. 09/01/15  Yes Biagio Borg, MD  Calcium Carbonate (CALCI-CHEW PO) Take 1 tablet by mouth 3 (three) times daily.   Yes Historical Provider, MD  Cyanocobalamin (VITAMIN B-12 SL) Place 1 tablet under the tongue daily.   Yes Historical Provider, MD  escitalopram (LEXAPRO) 10 MG tablet TAKE 1 TABLET (10 MG TOTAL) BY MOUTH DAILY. 01/21/15  Yes Biagio Borg, MD  Ferrous Sulfate (IRON) 90 (18 Fe) MG TABS Take 18 mg by mouth daily.   Yes Historical Provider, MD  hydrochlorothiazide (HYDRODIURIL) 25 MG tablet Take 1  tablet (25 mg total) by mouth daily. 07/18/15  Yes Dorothy Spark, MD  lisinopril (PRINIVIL,ZESTRIL) 5 MG tablet Take 1 tablet (5 mg total) by mouth daily. 08/05/15  Yes Biagio Borg, MD  metFORMIN (GLUCOPHAGE) 500 MG tablet Take 1 tablet (500 mg total) by mouth daily with breakfast. 04/24/15  Yes Renato Shin, MD  Multiple Vitamin (MULTIVITAMIN WITH MINERALS) TABS tablet Take 1 tablet by mouth daily.   Yes Historical Provider, MD  nitroGLYCERIN (NITROSTAT) 0.4 MG SL tablet Place 1 tablet (0.4 mg total) under the tongue every 5 (five) minutes as needed for chest pain. 07/30/15  Yes Dorothy Spark, MD  nystatin (MYCOSTATIN) powder Use as directed up to twice per day as needed Patient taking differently: Apply topically 2 (two) times daily as needed (rash/ skin irritation).  10/29/15  Yes Biagio Borg, MD  omeprazole (PRILOSEC) 20 MG capsule Take 1 capsule (20 mg total) by mouth daily. 05/17/15  Yes Domenic Moras, PA-C  pregabalin (LYRICA) 50 MG capsule Take 1 capsule (50 mg total) by mouth 2 (two) times daily. Patient taking differently: Take 50 mg by mouth daily after supper.  10/14/15  Yes Biagio Borg, MD  ranitidine (ZANTAC) 150 MG tablet Take 150 mg by mouth 2 (two) times daily. 11/26/15  Yes Historical Provider, MD  senna-docusate (SENNA PLUS) 8.6-50 MG tablet Take 1 tablet by mouth daily as needed for mild constipation.   Yes Historical Provider, MD  warfarin (COUMADIN) 5 MG tablet TAKE AS DIRECTED BY ANTICOAGULATION CLINIC Patient taking differently: TAKE 3 TABLETS BY MOUTH ON TUESDAY AND FRIDAY, TAKE 2 TABLETS ON ALL OTHER DAYS OR AS DIRECTED BY ANTICOAGULATION CLINIC 09/10/15  Yes Biagio Borg, MD  albuterol (PROAIR HFA) 108 (90 BASE) MCG/ACT inhaler Inhale 2 puffs into the lungs every 4 (four) hours as needed. For shortness of breath 10/10/13   Biagio Borg, MD  budesonide-formoterol [facility]) 80-4.5 MCG/ACT inhaler Inhale 2 puffs into the lungs 2 (two) times daily. 01/25/14   Rowe Clack, MD  ciprofloxacin (CIPRO) 250 MG tablet Take 2 tablets (500 mg total) by mouth every 12 (twelve) hours. 12/22/15 12/28/15  Jarome Matin, MD  HYDROcodone-acetaminophen (NORCO/VICODIN) 5-325 MG tablet Take 1 tablet by mouth every 4 (four) hours as needed. 12/22/15   Jarome Matin, MD  metFORMIN (GLUCOPHAGE) 500 MG tablet TAKE 1 TABLET (500 MG TOTAL) BY MOUTH 2 (TWO) TIMES DAILY WITH A MEAL. 12/19/15   Renato Shin, MD  ondansetron (ZOFRAN) 4 MG tablet Take 1 tablet (4 mg total) by mouth every 12 (twelve) hours as needed for nausea or vomiting. 12/22/15   Jarome Matin, MD  promethazine [facility])  25 MG tablet Take 1 tablet (25 mg total) by mouth every 8 (eight) hours as needed for nausea or vomiting. 12/22/15   Biagio Borg, MD   BP 117/67 mmHg  Pulse 58  Temp(Src) 98.4 F (36.9 C) (Oral)  Resp 22  SpO2 97% Physical Exam  Constitutional: He is oriented to person, place, and time. No distress.  HENT:  Head: Normocephalic and atraumatic.  Eyes: EOM are normal. Pupils are equal, round, and reactive to light.  Neck: Normal range of motion. Neck supple.  Cardiovascular: Normal rate.   Pulmonary/Chest: Effort normal. No respiratory distress.  Abdominal: Soft. There is tenderness (diffuse but worse on the L side of the abdomen). There is no rebound and no guarding.  Musculoskeletal: Normal range of motion.  Neurological: He is alert and oriented to person, place, and time.  Skin: No rash noted. He is not diaphoretic.  Psychiatric: He has a normal mood and affect.    ED Course  Procedures (including critical care time) Labs Review Labs Reviewed  CBC - Abnormal; Notable for the following:    RBC 6.43 (*)    MCV 69.5 (*)    MCH 23.3 (*)    RDW 15.8 (*)    All other components within normal limits  PROTIME-INR - Abnormal; Notable for the following:    Prothrombin Time 24.0 (*)    INR 2.17 (*)    All other components within normal limits  COMPREHENSIVE METABOLIC PANEL - Abnormal; Notable  for the following:    Glucose, Bld 138 (*)    Calcium 8.8 (*)    All other components within normal limits  I-STAT CG4 LACTIC ACID, ED - Abnormal; Notable for the following:    Lactic Acid, Venous 2.88 (*)    All other components within normal limits  LIPASE, BLOOD  I-STAT CG4 LACTIC ACID, ED    Imaging Review Ct Abdomen Pelvis W Contrast  12/21/2015  CLINICAL DATA:  Acute onset of sharp mid abdominal pain and vomiting. Initial encounter. EXAM: CT ABDOMEN AND PELVIS WITH CONTRAST TECHNIQUE: Multidetector CT imaging of the abdomen and pelvis was performed using the standard protocol following bolus administration of intravenous contrast. CONTRAST:  152mL OMNIPAQUE IOHEXOL 300 MG/ML  SOLN COMPARISON:  CTA of the abdomen and pelvis performed 04/03/2010 FINDINGS: The visualized lung bases are clear. There is diffuse mesenteric edema involving the mesentery of the jejunum and proximal ileum, with associated mild wall thickening, concerning for acute infectious or inflammatory jejunitis. This extends distally from the level of the patient's gastrojejunal anastomosis. There is no definite evidence for bowel ischemia. The visualized vasculature appears grossly intact. The patient's distal stomach contains a small amount of fluid and is unremarkable in appearance. More proximal and distal small bowel loops are grossly unremarkable in appearance. A small amount of free fluid is noted tracking about the liver and spleen. A small amount of free fluid is seen in the pelvis. The liver and spleen are unremarkable in appearance. The gallbladder is within normal limits. The pancreas and adrenal glands are unremarkable. The kidneys are unremarkable in appearance. There is no evidence of hydronephrosis. No renal or ureteral stones are seen. Mild nonspecific perinephric stranding is noted bilaterally. No acute vascular abnormalities are seen. Scattered calcification is noted along the abdominal aorta and its branches. The  appendix is not definitely seen; there is no evidence for appendicitis. Scattered diverticulosis is noted along the descending colon, without evidence of diverticulitis. The bladder is mildly distended and grossly unremarkable.  The prostate is mildly enlarged, measuring 5.3 cm in transverse dimension. No inguinal lymphadenopathy is seen. No acute osseous abnormalities are identified. Vacuum phenomenon is noted at L4-L5 and L5-S1. IMPRESSION: 1. Diffuse mesenteric edema involving the mesentery of the jejunum and proximal ileum, with associated mild wall thickening, concerning for acute infectious or inflammatory jejunitis. This extends distally from the level of the patient's gastrojejunal anastomosis. 2. Visualized vasculature appears grossly intact. No definite evidence for bowel ischemia. 3. Small amount of free fluid noted tracking about the liver and spleen, and small amount of free fluid in the pelvis. 4. Scattered calcification along the abdominal aorta and its branches. 5. Scattered diverticulosis along the descending colon, without evidence of diverticulitis. 6. Mildly enlarged prostate noted. Electronically Signed   By: Garald Balding M.D.   On: 12/21/2015 23:50   Dg Abd Acute W/chest  12/21/2015  CLINICAL DATA:  Acute onset of nausea and vomiting. Mid abdominal pain. Hematemesis. Initial encounter. EXAM: DG ABDOMEN ACUTE W/ 1V CHEST COMPARISON:  Chest radiograph performed 05/16/2015 FINDINGS: The lungs are well-aerated. Mild vascular congestion is noted. There is no evidence of focal opacification, pleural effusion or pneumothorax. The cardiomediastinal silhouette is within normal limits. There is distention of a small bowel loop to 4.9 cm in maximal diameter, with question of mild mucosal thickening. There is no definite evidence for obstruction, as remaining bowel loops are unremarkable in appearance. This may reflect some degree of infectious or inflammatory change. Scattered air is seen within the  colon. No free intra-abdominal air is identified on the provided upright view. No acute osseous abnormalities are seen; the sacroiliac joints are unremarkable in appearance. IMPRESSION: 1. Mild vascular congestion noted.  Lungs remain grossly clear. 2. Distention of a small bowel loop to 4.9 cm in maximal diameter, with question of mild mucosal thickening. No definite evidence for bowel obstruction. This may reflect some degree of infectious or inflammatory change. No free intra-abdominal air seen. Electronically Signed   By: Garald Balding M.D.   On: 12/21/2015 21:49   I have personally reviewed and evaluated these images and lab results as part of my medical decision-making.   EKG Interpretation None      MDM   Final diagnoses:  Jejunitis    35 y M w PMH DM, HL, HTN, prev gastric bypass, who comes in with left sided abdominal pain associated w/ mult episodes of NBNB emesis since today.  Exam as above.  Sig ttp on exam.  Concern for bowel perforation, sbo, AAA  Will obtain cbc/cmp/lipase/lactic acid.  Will obtain plain film of abdomen and ct abdomen/pelvis w/ contrast.  Will give dilaudid for pain, zofran for nausea.  Plain film w/o free air.  Cbc w/ normal wbc.  Lactic acid normal.  cmp and lipase unremarkaable.  Pain improved after two doses of IV dilaudid.  Nausea also improved with zofran.  Ct abdomen/pelvis shows jejunitis.  Have offered admission vs. Discharge with close pcp f/u and patient has elected to go home with close f/u.  Will treat with cipro/flagyl/zofran/norco and have him see his doctor tomorrow.  I have discussed the results, Dx and Tx plan with the pt. They expressed understanding and agree with the plan and were told to return to ED with any worsening of condition or concern.    Disposition: Discharge  Condition: Good  Discharge Medication List as of 12/22/2015 12:34 AM    START taking these medications   Details  ciprofloxacin (CIPRO) 250 MG tablet Take 2  tablets (500 mg total) by mouth every 12 (twelve) hours., Starting 12/22/2015, Until Sun 12/28/15, Print    HYDROcodone-acetaminophen (NORCO/VICODIN) 5-325 MG tablet Take 1 tablet by mouth every 4 (four) hours as needed., Starting 12/22/2015, Until Discontinued, Print    ondansetron (ZOFRAN) 4 MG tablet Take 1 tablet (4 mg total) by mouth every 12 (twelve) hours as needed for nausea or vomiting., Starting 12/22/2015, Until Discontinued, Print    metroNIDAZOLE (FLAGYL) 500 MG tablet Take 1 tablet (500 mg total) by mouth 3 (three) times daily., Starting 12/22/2015, Until Sun 12/28/15, Print        Follow Up: Biagio Borg, MD [place] [id number] [phone]  In 1 day    Pt seen in conjunction with Dr. Donnella Bi, MD 12/22/15 [place], MD 12/25/15 [phone]

## 2015-12-22 NOTE — Progress Notes (Signed)
Pre visit review using our clinic review tool, if applicable. No additional management support is needed unless otherwise documented below in the visit note. 

## 2015-12-23 DIAGNOSIS — R109 Unspecified abdominal pain: Secondary | ICD-10-CM | POA: Insufficient documentation

## 2015-12-23 LAB — HEPATITIS C ANTIBODY: HCV AB: NEGATIVE

## 2015-12-23 NOTE — Assessment & Plan Note (Signed)
stable overall by history and exam, recent data reviewed with pt, and pt to continue medical treatment as before,  to f/u any worsening symptoms or concerns BP Readings from Last 3 Encounters:  12/22/15 118/80  12/22/15 117/67  10/29/15 118/72

## 2015-12-23 NOTE — Assessment & Plan Note (Signed)

## 2015-12-23 NOTE — Assessment & Plan Note (Signed)
Likely related to gastrojejunitis with some mesentary involvement, to finish antibx as prescribed, change zofran to phenergan prn due to insurance, encourage po, f/u GI as planned

## 2015-12-26 DIAGNOSIS — E669 Obesity, unspecified: Secondary | ICD-10-CM | POA: Diagnosis not present

## 2015-12-26 DIAGNOSIS — G4733 Obstructive sleep apnea (adult) (pediatric): Secondary | ICD-10-CM | POA: Diagnosis not present

## 2015-12-26 DIAGNOSIS — E119 Type 2 diabetes mellitus without complications: Secondary | ICD-10-CM | POA: Diagnosis not present

## 2015-12-26 DIAGNOSIS — M5400 Panniculitis affecting regions of neck and back, site unspecified: Secondary | ICD-10-CM | POA: Diagnosis not present

## 2015-12-26 DIAGNOSIS — I10 Essential (primary) hypertension: Secondary | ICD-10-CM | POA: Diagnosis not present

## 2016-01-05 ENCOUNTER — Other Ambulatory Visit: Payer: Self-pay | Admitting: Internal Medicine

## 2016-01-09 ENCOUNTER — Ambulatory Visit (INDEPENDENT_AMBULATORY_CARE_PROVIDER_SITE_OTHER): Payer: PPO | Admitting: General Practice

## 2016-01-09 DIAGNOSIS — Z5181 Encounter for therapeutic drug level monitoring: Secondary | ICD-10-CM | POA: Diagnosis not present

## 2016-01-09 DIAGNOSIS — I2699 Other pulmonary embolism without acute cor pulmonale: Secondary | ICD-10-CM

## 2016-01-09 LAB — POCT INR: INR: 4.3

## 2016-01-09 NOTE — Progress Notes (Signed)
Pre visit review using our clinic review tool, if applicable. No additional management support is needed unless otherwise documented below in the visit note.  INR is high today.  Patient was given Cipro and Doxycycline in the ER.  Encouraged patient to contact the clinic when given antibiotics because they can increase the INR.  Patient to hold dosage 2 days and then continue current regimen.  Re-check in 2 to 3 weeks and go to ER if any unusual bleeding occurs.

## 2016-01-09 NOTE — Progress Notes (Signed)
I have reviewed and agree with the plan. 

## 2016-01-27 ENCOUNTER — Other Ambulatory Visit: Payer: Self-pay | Admitting: Internal Medicine

## 2016-02-05 ENCOUNTER — Encounter: Payer: Self-pay | Admitting: Internal Medicine

## 2016-02-05 ENCOUNTER — Ambulatory Visit (INDEPENDENT_AMBULATORY_CARE_PROVIDER_SITE_OTHER): Payer: PPO | Admitting: Internal Medicine

## 2016-02-05 VITALS — BP 112/74 | HR 61 | Temp 97.9°F | Resp 16 | Wt 253.0 lb

## 2016-02-05 DIAGNOSIS — K645 Perianal venous thrombosis: Secondary | ICD-10-CM | POA: Diagnosis not present

## 2016-02-05 MED ORDER — LIDOCAINE-HYDROCORTISONE ACE 3-0.5 % RE CREA: 1.0000 | TOPICAL_CREAM | Freq: Two times a day (BID) | RECTAL | Status: DC

## 2016-02-05 NOTE — Progress Notes (Signed)
Pre visit review using our clinic review tool, if applicable. No additional management support is needed unless otherwise documented below in the visit note. 

## 2016-02-05 NOTE — Patient Instructions (Signed)
Use the cream I sent to your pharmacy twice daily.  Take warm sitz baths 2-3 times a day.  If you have constipation or have to strain to have a bowel movement increase your fiber or take a stool softener.   If there is no improvement over the next few days or your pain worsens please call so we can refer you to a specialist.

## 2016-02-05 NOTE — Progress Notes (Signed)
Subjective:    Patient ID: [name], male    DOB: November 05, 1956, 60 y.o.   MRN: [id number]  HPI He is here for an acute visit for rectal pain.  His has rectal pain and a bulge in that area.  The pain started one week ago and he noticed the bulge four days ago.  He has pain with sitting, but not with a bowel movement.  His bowel movements have been normal, he denies any constipation or diarrhea.  His stool is soft and he has not needed to strain.  He denies any blood in the stool or on his tissue.  He denies any history of hemorrhoids.  He did try Preparation H and it seemed to help temporarily, but the pain and swelling returned the next day.  Normal colonoscopy 2011.    Medications and allergies reviewed with patient and updated if appropriate.  Patient Active Problem List   Diagnosis Date Noted  . Abdominal pain 12/23/2015  . Rash 10/29/2015  . Chest pain 03/27/2015  . Dysuria 03/27/2015  . Diabetes ([place]) 10/01/2014  . Nipple pain 03/13/2014  . Breast mass in male 03/13/2014  . Encounter for therapeutic drug monitoring 01/25/2014  . Peripheral edema 10/10/2013  . OSA (obstructive sleep apnea) 09/11/2013  . Lesion of penis 01/01/2013  . Left lumbar radiculopathy 06/14/2012  . S/P left knee replacement 01/10/2012  . Preventative health care 12/16/2011  . Back pain with radiation 09/18/2011  . Long term current use of anticoagulant 04/08/2011  . Pulmonary embolism ([place]) 02/05/2011  . HYPOTENSION, ORTHOSTATIC 01/29/2010  . CONSTIPATION 01/21/2010  . DEGENERATIVE JOINT DISEASE, KNEES, BILATERAL 11/17/2009  . SMOKER 09/12/2009  . HYPOGONADISM, MALE 03/04/2008  . OBESITY, MORBID 03/04/2008  . Depression 10/05/2007  . DIVERTICULOSIS, COLON 10/05/2007  . RENAL INSUFFICIENCY 10/05/2007  . COLONIC POLYPS, HX OF 10/05/2007  . Hyperlipidemia 06/30/2007  . Essential hypertension 06/30/2007    Current Outpatient Prescriptions on File Prior to Visit  Medication Sig Dispense  Refill  . albuterol (PROAIR HFA) 108 (90 BASE) MCG/ACT inhaler Inhale 2 puffs into the lungs every 4 (four) hours as needed. For shortness of breath 1 Inhaler 11  . aspirin EC 81 MG tablet Take 81 mg by mouth daily.    Marland Kitchen atorvastatin (LIPITOR) 80 MG tablet TAKE 1 TABLET (80 MG TOTAL) BY MOUTH DAILY. 90 tablet 3  . budesonide-formoterol (SYMBICORT) 80-4.5 MCG/ACT inhaler Inhale 2 puffs into the lungs 2 (two) times daily. 1 Inhaler 3  . Calcium Carbonate (CALCI-CHEW PO) Take 1 tablet by mouth 3 (three) times daily.    . Cyanocobalamin (VITAMIN B-12 SL) Place 1 tablet under the tongue daily.    Marland Kitchen escitalopram (LEXAPRO) 10 MG tablet TAKE 1 TABLET (10 MG TOTAL) BY MOUTH DAILY. 90 tablet 3  . Ferrous Sulfate (IRON) 90 (18 Fe) MG TABS Take 18 mg by mouth daily.    . hydrochlorothiazide (HYDRODIURIL) 25 MG tablet Take 1 tablet (25 mg total) by mouth daily. 30 tablet 11  . HYDROcodone-acetaminophen (NORCO/VICODIN) 5-325 MG tablet Take 1 tablet by mouth every 4 (four) hours as needed. 20 tablet 0  . lisinopril (PRINIVIL,ZESTRIL) 5 MG tablet TAKE 1 TABLET (5 MG TOTAL) BY MOUTH DAILY. 90 tablet 1  . metFORMIN (GLUCOPHAGE) 500 MG tablet Take 1 tablet (500 mg total) by mouth daily with breakfast. 180 tablet 3  . metFORMIN (GLUCOPHAGE) 500 MG tablet TAKE 1 TABLET (500 MG TOTAL) BY MOUTH 2 (TWO) TIMES DAILY WITH A MEAL. [place]  tablet 0  . Multiple Vitamin (MULTIVITAMIN WITH MINERALS) TABS tablet Take 1 tablet by mouth daily.    . nitroGLYCERIN (NITROSTAT) 0.4 MG SL tablet Place 1 tablet (0.4 mg total) under the tongue every 5 (five) minutes as needed for chest pain. 90 tablet 3  . nystatin (MYCOSTATIN) powder Use as directed up to twice per day as needed (Patient taking differently: Apply topically 2 (two) times daily as needed (rash/ skin irritation). ) 60 g 5  . omeprazole (PRILOSEC) 20 MG capsule Take 1 capsule (20 mg total) by mouth daily. 10 capsule 0  . ondansetron (ZOFRAN) 4 MG tablet Take 1 tablet (4 mg  total) by mouth every 12 (twelve) hours as needed for nausea or vomiting. 12 tablet 0  . pregabalin (LYRICA) 50 MG capsule Take 1 capsule (50 mg total) by mouth 2 (two) times daily. (Patient taking differently: Take 50 mg by mouth daily after supper. ) 60 capsule 5  . promethazine (PHENERGAN) 25 MG tablet Take 1 tablet (25 mg total) by mouth every 8 (eight) hours as needed for nausea or vomiting. 30 tablet 0  . ranitidine (ZANTAC) 150 MG tablet Take 150 mg by mouth 2 (two) times daily.  3  . senna-docusate (SENNA PLUS) 8.6-50 MG tablet Take 1 tablet by mouth daily as needed for mild constipation.    Marland Kitchen warfarin (COUMADIN) 5 MG tablet TAKE AS DIRECTED BY ANTICOAGULATION CLINIC (Patient taking differently: TAKE 3 TABLETS BY MOUTH ON TUESDAY AND FRIDAY, TAKE 2 TABLETS ON ALL OTHER DAYS OR AS DIRECTED BY ANTICOAGULATION CLINIC) 180 tablet 1   No current facility-administered medications on file prior to visit.    Past Medical History  Diagnosis Date  . Diabetes mellitus     type II  . Hyperlipidemia   . Hypertension   . DVT, lower extremity ([place])     right  . Embolus ([place]) 2005    pulmonary  . Depression   . Obesity   . BACK PAIN   . COLONIC POLYPS, HX OF   . DEGENERATIVE JOINT DISEASE, KNEES, BILATERAL   . DEPRESSION   . DIVERTICULOSIS, COLON   . HYPOGONADISM, MALE   . HYPOTENSION, ORTHOSTATIC   . OBESITY, MORBID   . Long term current use of anticoagulant   . Obstructive sleep apnea     uses cpap setting 8 to 10  . Kidney stones   . Renal insufficiency   . RENAL INSUFFICIENCY     Past Surgical History  Procedure Laterality Date  . Knee surgury  2000 & 2003     cartilage damage  . Ankle surgury  2001 bilateral    with bone spurs and torn tendon  . Edg  2008    chronic Duodenitis  . Tonsillectomy and adenoidectomy  age 71 or 89  . Total knee arthroplasty  01/10/2012    Procedure: TOTAL KNEE ARTHROPLASTY;  Surgeon: [name], MD;  Location: [facility];  Service: Orthopedics;   Laterality: Left;  . Lumbar laminectomy/decompression microdiscectomy  09/28/2012    Procedure: LUMBAR LAMINECTOMY/DECOMPRESSION MICRODISCECTOMY 2 LEVELS;  Surgeon: [name], MD;  Location: [facility];  Service: Orthopedics;  Laterality: Left;  Decompressive laminectomy L4-L5. Microdiscectomy L5-S1  . Heel spur surgery Bilateral 2005  . Replacement total knee  2013  . Gastric bypass      Social History   Social History  . Marital Status: Single    Spouse Name: N/A  . Number of Children: 2  . Years of Education:  N/A   Occupational History  . back at work at [employer] since Jan. 2010    Social History Main Topics  . Smoking status: Former Smoker -- 0.25 packs/day for 5 years    Quit date: 12/06/1978  . Smokeless tobacco: Never Used  . Alcohol Use: 0.0 oz/week    0 Standard drinks or equivalent per week     Comment: occasional  . Drug Use: No  . Sexual Activity: Not Currently   Other Topics Concern  . Not on file   Social History Narrative   Daily caffeine use one per day   Protein drinks    Live alone   Children; a friend and sister that helps   Brother that is around [place])   HAVE A SON AND DTR IN [place]    Family History  Problem Relation Age of Onset  . Hypertension Mother   . Prostate cancer Father   . Diabetes Sister     died with DM Apr 01, 2001, 2 more sisters with diabetes  . Heart failure Sister   . Diabetes Brother   . Diabetes Sister   . Diabetes Sister     Review of Systems Se HPI    Objective:   Filed Vitals:   02/05/16 1037  BP: 112/74  Pulse: 61  Temp: 97.9 F (36.6 C)  Resp: 16   Filed Weights   02/05/16 1037  Weight: 253 lb (114.76 kg)   Body mass index is 32.47 kg/(m^2).   Physical Exam  Constitutional: He appears well-developed and well-nourished. No distress.  Genitourinary:  Grape-sized thrombosed hemorrhoid and rectal area that is tender, normal rectal tone, no rectal exam performed        Assessment &  Plan:    Thrombosed external hemorrhoid He had mild improvement with prep H, but it was temporary Hydrocortisone-lidocaine gel BID Warm sitz baths 2-3 times a day Avoid straining, constipation and diarrhea Call if no improvement or any worsening

## 2016-02-06 ENCOUNTER — Ambulatory Visit: Payer: Self-pay

## 2016-02-13 ENCOUNTER — Ambulatory Visit (INDEPENDENT_AMBULATORY_CARE_PROVIDER_SITE_OTHER): Payer: PPO | Admitting: General Practice

## 2016-02-13 DIAGNOSIS — Z5181 Encounter for therapeutic drug level monitoring: Secondary | ICD-10-CM

## 2016-02-13 LAB — POCT INR: INR: 1.8

## 2016-02-13 NOTE — Progress Notes (Signed)
I have reviewed and agree with the plan. 

## 2016-02-13 NOTE — Progress Notes (Signed)
Pre visit review using our clinic review tool, if applicable. No additional management support is needed unless otherwise documented below in the visit note. 

## 2016-03-01 DIAGNOSIS — Z411 Encounter for cosmetic surgery: Secondary | ICD-10-CM | POA: Diagnosis not present

## 2016-03-01 DIAGNOSIS — M793 Panniculitis, unspecified: Secondary | ICD-10-CM | POA: Diagnosis not present

## 2016-03-01 DIAGNOSIS — E881 Lipodystrophy, not elsewhere classified: Secondary | ICD-10-CM | POA: Diagnosis not present

## 2016-03-05 ENCOUNTER — Telehealth: Payer: Self-pay | Admitting: General Practice

## 2016-03-05 NOTE — Telephone Encounter (Signed)
Instructed patient to re-start coumadin.  15 mg today and tomorrow and then resume current dosage of 10 mg daily except 15 mg Tues/Friday.  Check INR in 1 week.  Patient verbalized understanding.

## 2016-03-07 ENCOUNTER — Other Ambulatory Visit: Payer: Self-pay | Admitting: Internal Medicine

## 2016-03-12 ENCOUNTER — Ambulatory Visit: Payer: Self-pay

## 2016-03-12 ENCOUNTER — Ambulatory Visit (INDEPENDENT_AMBULATORY_CARE_PROVIDER_SITE_OTHER): Payer: PPO | Admitting: General Practice

## 2016-03-12 DIAGNOSIS — Z5181 Encounter for therapeutic drug level monitoring: Secondary | ICD-10-CM

## 2016-03-12 DIAGNOSIS — I2699 Other pulmonary embolism without acute cor pulmonale: Secondary | ICD-10-CM

## 2016-03-12 LAB — POCT INR: INR: 1.4

## 2016-03-12 NOTE — Progress Notes (Signed)
Pre visit review using our clinic review tool, if applicable. No additional management support is needed unless otherwise documented below in the visit note. 

## 2016-03-12 NOTE — Progress Notes (Signed)
I have reviewed and agree with the plan. 

## 2016-03-18 ENCOUNTER — Ambulatory Visit (INDEPENDENT_AMBULATORY_CARE_PROVIDER_SITE_OTHER): Payer: PPO | Admitting: Internal Medicine

## 2016-03-18 ENCOUNTER — Encounter: Payer: Self-pay | Admitting: Internal Medicine

## 2016-03-18 VITALS — BP 98/64 | HR 62 | Temp 97.8°F | Ht 74.0 in | Wt 245.0 lb

## 2016-03-18 DIAGNOSIS — I10 Essential (primary) hypertension: Secondary | ICD-10-CM | POA: Diagnosis not present

## 2016-03-18 DIAGNOSIS — Z7901 Long term (current) use of anticoagulants: Secondary | ICD-10-CM | POA: Diagnosis not present

## 2016-03-18 DIAGNOSIS — I808 Phlebitis and thrombophlebitis of other sites: Secondary | ICD-10-CM | POA: Diagnosis not present

## 2016-03-18 HISTORY — DX: Phlebitis and thrombophlebitis of other sites: I80.8

## 2016-03-18 NOTE — Progress Notes (Signed)
Pre visit review using our clinic review tool, if applicable. No additional management support is needed unless otherwise documented below in the visit note. 

## 2016-03-18 NOTE — Patient Instructions (Signed)
OK to stop the fluid pill (hydrochlorothiazide) due to lower blood pressure  Please continue all other medications as before, and refills have been done if requested.  Please have the pharmacy call with any other refills you may need.  Please continue your efforts at being more active, low cholesterol diet, and weight control.  Please keep your appointments with your specialists as you may have planned

## 2016-03-18 NOTE — Progress Notes (Signed)
Subjective:    Patient ID: [name], male    DOB: 06-05-1956, 60 y.o.   MRN: [id number]  HPI  Here to f/u, with onset 3-4 days noticed left post wrist pain with a sort of "line" under the skin palpable that wasn't there before, mild tender, assoc with slight skin erythema and mild swelling to about mid arm, no other overlying skin change or rash,  Had recent surgury, with IV in left arm.  Lost significant wt around the surgury.  BP has been lower recently. Wt Readings from Last 3 Encounters:  03/18/16 245 lb (111.131 kg)  02/05/16 253 lb (114.76 kg)  12/22/15 261 lb 8 oz (118.616 kg)  on coumadin, last recent INR suboptimal, as been f/u with coumadin clinic Lab Results  Component Value Date   INR 1.4 03/12/2016   INR 1.8 02/13/2016   INR 4.3 01/09/2016   PROTIME 18.2 05/16/2009   PROTIME 14.7 05/07/2009   Past Medical History  Diagnosis Date  . Diabetes mellitus     type II  . Hyperlipidemia   . Hypertension   . DVT, lower extremity ([place])     right  . Embolus ([place]) 2005    pulmonary  . Depression   . Obesity   . BACK PAIN   . COLONIC POLYPS, HX OF   . DEGENERATIVE JOINT DISEASE, KNEES, BILATERAL   . DEPRESSION   . DIVERTICULOSIS, COLON   . HYPOGONADISM, MALE   . HYPOTENSION, ORTHOSTATIC   . OBESITY, MORBID   . Long term current use of anticoagulant   . Obstructive sleep apnea     uses cpap setting 8 to 10  . Kidney stones   . Renal insufficiency   . RENAL INSUFFICIENCY    Past Surgical History  Procedure Laterality Date  . Knee surgury  2000 & 2003     cartilage damage  . Ankle surgury  2001 bilateral    with bone spurs and torn tendon  . Edg  2008    chronic Duodenitis  . Tonsillectomy and adenoidectomy  age 74 or 18  . Total knee arthroplasty  01/10/2012    Procedure: TOTAL KNEE ARTHROPLASTY;  Surgeon: [name], MD;  Location: [facility];  Service: Orthopedics;  Laterality: Left;  . Lumbar laminectomy/decompression microdiscectomy  09/28/2012   Procedure: LUMBAR LAMINECTOMY/DECOMPRESSION MICRODISCECTOMY 2 LEVELS;  Surgeon: [name], MD;  Location: [facility];  Service: Orthopedics;  Laterality: Left;  Decompressive laminectomy L4-L5. Microdiscectomy L5-S1  . Heel spur surgery Bilateral 2005  . Replacement total knee  2013  . Gastric bypass      reports that he quit smoking about 37 years ago. He has never used smokeless tobacco. He reports that he drinks alcohol. He reports that he does not use illicit drugs. family history includes Diabetes in his brother, sister, sister, and sister; Heart failure in his sister; Hypertension in his mother; Prostate cancer in his father. Allergies  Allergen Reactions  . Other     NO BLOOD -JEHOVAH'S WITNESS-SIGNED REFUSAL BUT WOULD TAKE ALBUMIN   Current Outpatient Prescriptions on File Prior to Visit  Medication Sig Dispense Refill  . aspirin EC 81 MG tablet Take 81 mg by mouth daily.    Marland Kitchen atorvastatin (LIPITOR) 80 MG tablet TAKE 1 TABLET (80 MG TOTAL) BY MOUTH DAILY. 90 tablet 3  . Calcium Carbonate (CALCI-CHEW PO) Take 1 tablet by mouth 3 (three) times daily.    . Cyanocobalamin (VITAMIN B-12 SL) Place 1 tablet under  the tongue daily.    Marland Kitchen escitalopram (LEXAPRO) 10 MG tablet Take 1 tablet (10 mg total) by mouth daily. 90 tablet 1  . Ferrous Sulfate (IRON) 90 (18 Fe) MG TABS Take 18 mg by mouth daily.    . hydrochlorothiazide (HYDRODIURIL) 25 MG tablet Take 1 tablet (25 mg total) by mouth daily. 30 tablet 11  . lidocaine-hydrocortisone (ANAMANTEL HC) 3-0.5 % CREA Place 1 Applicatorful rectally 2 (two) times daily. 28.3 g 0  . lisinopril (PRINIVIL,ZESTRIL) 5 MG tablet TAKE 1 TABLET (5 MG TOTAL) BY MOUTH DAILY. 90 tablet 1  . metFORMIN (GLUCOPHAGE) 500 MG tablet Take 1 tablet (500 mg total) by mouth daily with breakfast. 180 tablet 3  . Multiple Vitamin (MULTIVITAMIN WITH MINERALS) TABS tablet Take 1 tablet by mouth daily.    Marland Kitchen nystatin (MYCOSTATIN) powder Use as directed up to twice per day  as needed (Patient taking differently: Apply topically 2 (two) times daily as needed (rash/ skin irritation). ) 60 g 5  . omeprazole (PRILOSEC) 20 MG capsule Take 1 capsule (20 mg total) by mouth daily. 10 capsule 0  . ondansetron (ZOFRAN) 4 MG tablet Take 1 tablet (4 mg total) by mouth every 12 (twelve) hours as needed for nausea or vomiting. 12 tablet 0  . pregabalin (LYRICA) 50 MG capsule Take 1 capsule (50 mg total) by mouth 2 (two) times daily. (Patient taking differently: Take 50 mg by mouth daily after supper. ) 60 capsule 5  . promethazine (PHENERGAN) 25 MG tablet Take 1 tablet (25 mg total) by mouth every 8 (eight) hours as needed for nausea or vomiting. 30 tablet 0  . ranitidine (ZANTAC) 150 MG tablet Take 150 mg by mouth 2 (two) times daily.  3  . senna-docusate (SENNA PLUS) 8.6-50 MG tablet Take 1 tablet by mouth daily as needed for mild constipation.    Marland Kitchen warfarin (COUMADIN) 5 MG tablet Take 1 tablet (5 mg total) by mouth as directed. 180 tablet 1  . nitroGLYCERIN (NITROSTAT) 0.4 MG SL tablet Place 1 tablet (0.4 mg total) under the tongue every 5 (five) minutes as needed for chest pain. (Patient not taking: Reported on 03/18/2016) 90 tablet 3   No current facility-administered medications on file prior to visit.   Review of Systems  Constitutional: Negative for unusual diaphoresis or night sweats HENT: Negative for ear swelling or discharge Eyes: Negative for worsening visual haziness  Respiratory: Negative for choking and stridor.   Gastrointestinal: Negative for distension or worsening eructation Genitourinary: Negative for retention or change in urine volume.  Musculoskeletal: Negative for other MSK pain or swelling Skin: Negative for color change and worsening wound Neurological: Negative for tremors and numbness other than noted  Psychiatric/Behavioral: Negative for decreased concentration or agitation other than above       Objective:   Physical Exam BP 98/64 mmHg  Pulse  62  Temp(Src) 97.8 F (36.6 C) (Oral)  Ht 6\' 2"  (1.88 m)  Wt 245 lb (111.131 kg)  BMI 31.44 kg/m2  SpO2 97% VS noted, not ill appearing Constitutional: Pt appears in no apparent distress HENT: Head: NCAT.  Right Ear: External ear normal.  Left Ear: External ear normal.  Eyes: . Pupils are equal, round, and reactive to light. Conjunctivae and EOM are normal Neck: Normal range of motion. Neck supple.  Cardiovascular: Normal rate and regular rhythm.   Pulmonary/Chest: Effort normal and breath sounds without rales or wheezing.  LUE with localized area slight erythema, mild tender and swelling assoc with  a subq superficial vein cord, located just prox to post wrist and extending linearly to mid post arm, o/w neurovasc intact Neurological: Pt is alert. Not confused , motor grossly intact Skin: Skin is warm. No rash, no LE edema Psychiatric: Pt behavior is normal. No agitation.     Assessment & Plan:

## 2016-03-18 NOTE — Assessment & Plan Note (Signed)
Mild, d/w pt, declines need for pain medicine, d/w pt natural course, no specific therapy at this time

## 2016-03-18 NOTE — Assessment & Plan Note (Addendum)
Pt for close f/u with coumadin clinic, re-starting coumadin post surgury, declines lovenox bridge

## 2016-03-18 NOTE — Assessment & Plan Note (Signed)
Borderline low, ok to d/c HCT 25, cont low dose ACE for renoprotective,  to f/u any worsening symptoms or concerns

## 2016-03-22 ENCOUNTER — Emergency Department (HOSPITAL_COMMUNITY): Payer: PPO

## 2016-03-22 ENCOUNTER — Emergency Department (HOSPITAL_COMMUNITY)
Admission: EM | Admit: 2016-03-22 | Discharge: 2016-03-22 | Disposition: A | Discharge status: Home / Self Care | Payer: PPO | Attending: Emergency Medicine | Admitting: Emergency Medicine

## 2016-03-22 ENCOUNTER — Telehealth: Payer: Self-pay | Admitting: Internal Medicine

## 2016-03-22 ENCOUNTER — Encounter (HOSPITAL_COMMUNITY): Payer: Self-pay | Admitting: Emergency Medicine

## 2016-03-22 DIAGNOSIS — Z87891 Personal history of nicotine dependence: Secondary | ICD-10-CM | POA: Diagnosis not present

## 2016-03-22 DIAGNOSIS — Z9889 Other specified postprocedural states: Secondary | ICD-10-CM | POA: Diagnosis not present

## 2016-03-22 DIAGNOSIS — Z86718 Personal history of other venous thrombosis and embolism: Secondary | ICD-10-CM | POA: Diagnosis not present

## 2016-03-22 DIAGNOSIS — G4733 Obstructive sleep apnea (adult) (pediatric): Secondary | ICD-10-CM | POA: Diagnosis not present

## 2016-03-22 DIAGNOSIS — R1030 Lower abdominal pain, unspecified: Secondary | ICD-10-CM | POA: Diagnosis not present

## 2016-03-22 DIAGNOSIS — E785 Hyperlipidemia, unspecified: Secondary | ICD-10-CM | POA: Insufficient documentation

## 2016-03-22 DIAGNOSIS — Z86010 Personal history of colonic polyps: Secondary | ICD-10-CM | POA: Diagnosis not present

## 2016-03-22 DIAGNOSIS — Z79899 Other long term (current) drug therapy: Secondary | ICD-10-CM | POA: Insufficient documentation

## 2016-03-22 DIAGNOSIS — K91870 Postprocedural hematoma of a digestive system organ or structure following a digestive system procedure: Secondary | ICD-10-CM | POA: Insufficient documentation

## 2016-03-22 DIAGNOSIS — Z8739 Personal history of other diseases of the musculoskeletal system and connective tissue: Secondary | ICD-10-CM | POA: Diagnosis not present

## 2016-03-22 DIAGNOSIS — Z7984 Long term (current) use of oral hypoglycemic drugs: Secondary | ICD-10-CM | POA: Insufficient documentation

## 2016-03-22 DIAGNOSIS — Z7901 Long term (current) use of anticoagulants: Secondary | ICD-10-CM | POA: Insufficient documentation

## 2016-03-22 DIAGNOSIS — Z87448 Personal history of other diseases of urinary system: Secondary | ICD-10-CM | POA: Diagnosis not present

## 2016-03-22 DIAGNOSIS — L7631 Postprocedural hematoma of skin and subcutaneous tissue following a dermatologic procedure: Secondary | ICD-10-CM | POA: Diagnosis not present

## 2016-03-22 DIAGNOSIS — F329 Major depressive disorder, single episode, unspecified: Secondary | ICD-10-CM | POA: Diagnosis not present

## 2016-03-22 DIAGNOSIS — Z87442 Personal history of urinary calculi: Secondary | ICD-10-CM | POA: Diagnosis not present

## 2016-03-22 DIAGNOSIS — I10 Essential (primary) hypertension: Secondary | ICD-10-CM | POA: Diagnosis not present

## 2016-03-22 DIAGNOSIS — Z7982 Long term (current) use of aspirin: Secondary | ICD-10-CM | POA: Insufficient documentation

## 2016-03-22 DIAGNOSIS — Z86711 Personal history of pulmonary embolism: Secondary | ICD-10-CM | POA: Diagnosis not present

## 2016-03-22 DIAGNOSIS — S301XXA Contusion of abdominal wall, initial encounter: Secondary | ICD-10-CM

## 2016-03-22 DIAGNOSIS — Z9981 Dependence on supplemental oxygen: Secondary | ICD-10-CM | POA: Insufficient documentation

## 2016-03-22 DIAGNOSIS — E119 Type 2 diabetes mellitus without complications: Secondary | ICD-10-CM | POA: Diagnosis not present

## 2016-03-22 LAB — URINALYSIS, ROUTINE W REFLEX MICROSCOPIC
GLUCOSE, UA: NEGATIVE mg/dL
Hgb urine dipstick: NEGATIVE
KETONES UR: 15 mg/dL — AB
LEUKOCYTES UA: NEGATIVE
NITRITE: NEGATIVE
PH: 6 (ref 5.0–8.0)
PROTEIN: NEGATIVE mg/dL
Specific Gravity, Urine: 1.04 — ABNORMAL HIGH (ref 1.005–1.030)

## 2016-03-22 LAB — CBC WITH DIFFERENTIAL/PLATELET
BASOS ABS: 0 10*3/uL (ref 0.0–0.1)
Basophils Relative: 0 %
EOS ABS: 0 10*3/uL (ref 0.0–0.7)
Eosinophils Relative: 0 %
HEMATOCRIT: 41 % (ref 39.0–52.0)
Hemoglobin: 13.1 g/dL (ref 13.0–17.0)
LYMPHS PCT: 11 %
Lymphs Abs: 1.9 10*3/uL (ref 0.7–4.0)
MCH: 22.1 pg — ABNORMAL LOW (ref 26.0–34.0)
MCHC: 32 g/dL (ref 30.0–36.0)
MCV: 69.1 fL — AB (ref 78.0–100.0)
MONOS PCT: 7 %
Monocytes Absolute: 1.2 10*3/uL — ABNORMAL HIGH (ref 0.1–1.0)
NEUTROS PCT: 82 %
Neutro Abs: 13.8 10*3/uL — ABNORMAL HIGH (ref 1.7–7.7)
Platelets: 231 10*3/uL (ref 150–400)
RBC: 5.93 MIL/uL — ABNORMAL HIGH (ref 4.22–5.81)
RDW: 14.5 % (ref 11.5–15.5)
WBC: 16.9 10*3/uL — ABNORMAL HIGH (ref 4.0–10.5)

## 2016-03-22 LAB — I-STAT CG4 LACTIC ACID, ED: Lactic Acid, Venous: 1.95 mmol/L (ref 0.5–2.0)

## 2016-03-22 LAB — I-STAT CHEM 8, ED
BUN: 25 mg/dL — ABNORMAL HIGH (ref 6–20)
CHLORIDE: 97 mmol/L — AB (ref 101–111)
Calcium, Ion: 1.05 mmol/L — ABNORMAL LOW (ref 1.13–1.30)
Creatinine, Ser: 1.3 mg/dL — ABNORMAL HIGH (ref 0.61–1.24)
GLUCOSE: 109 mg/dL — AB (ref 65–99)
HEMATOCRIT: 49 % (ref 39.0–52.0)
HEMOGLOBIN: 16.7 g/dL (ref 13.0–17.0)
POTASSIUM: 3.9 mmol/L (ref 3.5–5.1)
Sodium: 135 mmol/L (ref 135–145)
TCO2: 27 mmol/L (ref 0–100)

## 2016-03-22 LAB — COMPREHENSIVE METABOLIC PANEL
ALBUMIN: 3.4 g/dL — AB (ref 3.5–5.0)
ALK PHOS: 61 U/L (ref 38–126)
ALT: 27 U/L (ref 17–63)
ANION GAP: 13 (ref 5–15)
AST: 22 U/L (ref 15–41)
BILIRUBIN TOTAL: 1.2 mg/dL (ref 0.3–1.2)
BUN: 21 mg/dL — ABNORMAL HIGH (ref 6–20)
CALCIUM: 9 mg/dL (ref 8.9–10.3)
CO2: 24 mmol/L (ref 22–32)
Chloride: 99 mmol/L — ABNORMAL LOW (ref 101–111)
Creatinine, Ser: 1.35 mg/dL — ABNORMAL HIGH (ref 0.61–1.24)
GFR calc non Af Amer: 55 mL/min — ABNORMAL LOW (ref >60)
Glucose, Bld: 109 mg/dL — ABNORMAL HIGH (ref 65–99)
POTASSIUM: 4.2 mmol/L (ref 3.5–5.1)
SODIUM: 136 mmol/L (ref 135–145)
TOTAL PROTEIN: 6.4 g/dL — AB (ref 6.5–8.1)

## 2016-03-22 LAB — I-STAT VENOUS BLOOD GAS, ED
Acid-Base Excess: 6 mmol/L — ABNORMAL HIGH (ref 0.0–2.0)
Bicarbonate: 30.3 mEq/L — ABNORMAL HIGH (ref 20.0–24.0)
O2 SAT: 19 %
PCO2 VEN: 43.2 mmHg — AB (ref 45.0–50.0)
PO2 VEN: 14 mmHg — AB (ref 31.0–45.0)
TCO2: 32 mmol/L (ref 0–100)
pH, Ven: 7.454 — ABNORMAL HIGH (ref 7.250–7.300)

## 2016-03-22 LAB — APTT: APTT: 42 s — AB (ref 24–37)

## 2016-03-22 MED ORDER — HYDROMORPHONE HCL 1 MG/ML IJ SOLN
1.0000 mg | Freq: Once | INTRAMUSCULAR | Status: AC
  Administered 2016-03-22: 1 mg via INTRAVENOUS
  Filled 2016-03-22: qty 1

## 2016-03-22 MED ORDER — HYDROCODONE-ACETAMINOPHEN 10-325 MG PO TABS
1.0000 | ORAL_TABLET | Freq: Once | ORAL | Status: AC
  Administered 2016-03-22: 1 via ORAL
  Filled 2016-03-22: qty 1

## 2016-03-22 MED ORDER — PIPERACILLIN-TAZOBACTAM 3.375 G IVPB 30 MIN
3.3750 g | Freq: Once | INTRAVENOUS | Status: AC
  Administered 2016-03-22: 3.375 g via INTRAVENOUS
  Filled 2016-03-22: qty 50

## 2016-03-22 MED ORDER — ONDANSETRON HCL 4 MG/2ML IJ SOLN
4.0000 mg | Freq: Once | INTRAMUSCULAR | Status: AC
  Administered 2016-03-22: 4 mg via INTRAVENOUS
  Filled 2016-03-22: qty 2

## 2016-03-22 MED ORDER — OXYCODONE HCL ER 10 MG PO T12A: 10.0000 mg | EXTENDED_RELEASE_TABLET | Freq: Three times a day (TID) | ORAL | Status: DC | PRN

## 2016-03-22 MED ORDER — IOPAMIDOL (ISOVUE-300) INJECTION 61%
INTRAVENOUS | Status: AC
  Administered 2016-03-22: 100 mL
  Filled 2016-03-22: qty 100

## 2016-03-22 MED ORDER — SODIUM CHLORIDE 0.9 % IV BOLUS (SEPSIS)
2000.0000 mL | Freq: Once | INTRAVENOUS | Status: AC
  Administered 2016-03-22: 2000 mL via INTRAVENOUS

## 2016-03-22 NOTE — ED Notes (Signed)
Patient transported to CT 

## 2016-03-22 NOTE — Telephone Encounter (Signed)
i have also called patient back, this is a request from [facility]---since i have not received a fax from [facility] to know what they are requesting---i'm not sure which image they need or what doctor to send that image to---patient advised that [facility] doctor can call Demitri Kucinski directly at office number to talk about request

## 2016-03-22 NOTE — ED Notes (Signed)
Attempted iv times 2 , 2nd RN to attempt with [place]

## 2016-03-22 NOTE — Telephone Encounter (Signed)
i have tried to locate a referral, imaging, some kind of encounter about patient's hand---dr john's cma/corrine is not aware of anything being faxed---i have routed to dr Jenny Reichmann, please advise

## 2016-03-22 NOTE — ED Provider Notes (Addendum)
CSN: [id number]     Arrival date & time 03/22/16  1341 History   First MD Initiated Contact with Patient 03/22/16 1517     Chief Complaint  Patient presents with  . Abdominal Pain     (Consider location/radiation/quality/duration/timing/severity/associated sxs/prior Treatment) HPI Comments: 60 y/o M comes in with cc of abd pain. Pt has hx of gastric bypass and recent panniculectomy.  He also has hx of PE on coumadin. Pt reports that since Panniculectomy 20 days ago he had been doing ok, until Sunday. Pt started having abd pain, lower quadrants - both above and below the incision site, on Sunday evening. Pain has gradually progressed and now is quite severe. Pt also has had blood tinged fluid in his JP tube which is new (had cleared), and the tube has more drainage recently. Pt has never had similar pain. He denies diarrhea, emesis. Pt has no fevers, chills, sweats.   ROS 10 Systems reviewed and are negative for acute change except as noted in the HPI.     Patient is a 60 y.o. male presenting with abdominal pain. The history is provided by the patient.  Abdominal Pain   Past Medical History  Diagnosis Date  . Diabetes mellitus     type II  . Hyperlipidemia   . Hypertension   . DVT, lower extremity ([place])     right  . Embolus ([place]) 10-Mar-2004    pulmonary  . Depression   . Obesity   . BACK PAIN   . COLONIC POLYPS, HX OF   . DEGENERATIVE JOINT DISEASE, KNEES, BILATERAL   . DEPRESSION   . DIVERTICULOSIS, COLON   . HYPOGONADISM, MALE   . HYPOTENSION, ORTHOSTATIC   . OBESITY, MORBID   . Long term current use of anticoagulant   . Obstructive sleep apnea     uses cpap setting 8 to 10  . Kidney stones   . Renal insufficiency   . RENAL INSUFFICIENCY    Past Surgical History  Procedure Laterality Date  . Knee surgury  03-11-1999 & 03-10-2002     cartilage damage  . Ankle surgury  Mar 10, 2000 bilateral    with bone spurs and torn tendon  . Edg  03-11-2007    chronic Duodenitis  . Tonsillectomy and  adenoidectomy  age 12 or 31  . Total knee arthroplasty  01/10/2012    Procedure: TOTAL KNEE ARTHROPLASTY;  Surgeon: [name], MD;  Location: [facility];  Service: Orthopedics;  Laterality: Left;  . Lumbar laminectomy/decompression microdiscectomy  09/28/2012    Procedure: LUMBAR LAMINECTOMY/DECOMPRESSION MICRODISCECTOMY 2 LEVELS;  Surgeon: [name], MD;  Location: [facility];  Service: Orthopedics;  Laterality: Left;  Decompressive laminectomy L4-L5. Microdiscectomy L5-S1  . Heel spur surgery Bilateral 03/10/04  . Replacement total knee  03/10/12  . Gastric bypass     Family History  Problem Relation Age of Onset  . Hypertension Mother   . Prostate cancer Father   . Diabetes Sister     died with DM 2001/03/10, 2 more sisters with diabetes  . Heart failure Sister   . Diabetes Brother   . Diabetes Sister   . Diabetes Sister    Social History  Substance Use Topics  . Smoking status: Former Smoker -- 0.25 packs/day for 5 years    Quit date: 12/06/1978  . Smokeless tobacco: Never Used  . Alcohol Use: 0.0 oz/week    0 Standard drinks or equivalent per week     Comment: occasional  Review of Systems  Gastrointestinal: Positive for abdominal pain.      Allergies  Adhesive and Other  Home Medications   Prior to Admission medications   Medication Sig Start Date End Date Taking? Authorizing Provider  Artificial Tear Ointment (ARTIFICIAL TEARS) ointment Place 1 drop into both eyes as needed (for dry eyes).   Yes Historical Provider, MD  aspirin EC 81 MG tablet Take 81 mg by mouth daily.   Yes Historical Provider, MD  atorvastatin (LIPITOR) 80 MG tablet TAKE 1 TABLET (80 MG TOTAL) BY MOUTH DAILY. Patient taking differently: TAKE 1 TABLET (80 MG TOTAL) BY MOUTH every evening 01/05/16  Yes Biagio Borg, MD  Calcium Carbonate (CALCI-CHEW PO) Take 1 tablet by mouth daily.    Yes Historical Provider, MD  Cyanocobalamin (VITAMIN B-12 SL) Place 1 tablet under the tongue daily.   Yes Historical  Provider, MD  escitalopram (LEXAPRO) 10 MG tablet Take 1 tablet (10 mg total) by mouth daily. 03/09/16  Yes Biagio Borg, MD  Ferrous Sulfate (IRON) 90 (18 Fe) MG TABS Take 18 mg by mouth daily.   Yes Historical Provider, MD  lidocaine-hydrocortisone (ANAMANTEL HC) 3-0.5 % CREA Place 1 Applicatorful rectally 2 (two) times daily. Patient taking differently: Place 1 Applicatorful rectally daily as needed (for skin irritation).  02/05/16  Yes Binnie Rail, MD  lisinopril (PRINIVIL,ZESTRIL) 5 MG tablet TAKE 1 TABLET (5 MG TOTAL) BY MOUTH DAILY. 01/27/16  Yes Biagio Borg, MD  metFORMIN (GLUCOPHAGE) 500 MG tablet Take 1 tablet (500 mg total) by mouth daily with breakfast. 04/24/15  Yes Renato Shin, MD  Multiple Vitamin (MULTIVITAMIN WITH MINERALS) TABS tablet Take 1 tablet by mouth daily.   Yes Historical Provider, MD  nitroGLYCERIN (NITROSTAT) 0.4 MG SL tablet Place 1 tablet (0.4 mg total) under the tongue every 5 (five) minutes as needed for chest pain. 07/30/15  Yes Dorothy Spark, MD  oxyCODONE-acetaminophen (PERCOCET/ROXICET) 5-325 MG tablet Take 1 tablet by mouth every 4 (four) hours as needed for moderate pain.  02/27/16  Yes Historical Provider, MD  polyethylene glycol (MIRALAX / GLYCOLAX) packet Take 17 g by mouth daily as needed for mild constipation.   Yes Historical Provider, MD  pregabalin (LYRICA) 50 MG capsule Take 1 capsule (50 mg total) by mouth 2 (two) times daily. Patient taking differently: Take 50 mg by mouth daily after supper.  10/14/15  Yes Biagio Borg, MD  senna-docusate (SENNA PLUS) 8.6-50 MG tablet Take 1 tablet by mouth daily as needed for mild constipation. Reported on 03/22/2016   Yes Historical Provider, MD  warfarin (COUMADIN) 5 MG tablet Take 1 tablet (5 mg total) by mouth as directed. Patient taking differently: Take 10-15 mg by mouth every morning. 10 mg Monday and Friday. 15 mg all other days 03/09/16  Yes Biagio Borg, MD  oxyCODONE (OXYCONTIN) 10 mg 12 hr tablet Take 1  tablet (10 mg total) by mouth every 8 (eight) hours as needed. 03/22/16   Olivine Hiers, MD   BP 98/65 mmHg  Pulse 57  Temp(Src) 98.7 F (37.1 C) (Oral)  Resp 12  SpO2 99% Physical Exam  Constitutional: He is oriented to person, place, and time. He appears well-developed.  HENT:  Head: Atraumatic.  Neck: Neck supple.  Cardiovascular: Normal rate.   Pulmonary/Chest: Effort normal.  Abdominal: Soft. Bowel sounds are normal. There is tenderness. There is guarding.  Pt has a large transverse surgical incision wound in the lower abdomen, skin below is slightly  warm to touch, but also covered with underwear and pants. There is no fluctuance, no clear erythema, no drainage.  Neurological: He is alert and oriented to person, place, and time.  Skin: Skin is warm.  Nursing note and vitals reviewed.   ED Course  Procedures (including critical care time) Labs Review Labs Reviewed  COMPREHENSIVE METABOLIC PANEL - Abnormal; Notable for the following:    Chloride 99 (*)    Glucose, Bld 109 (*)    BUN 21 (*)    Creatinine, Ser 1.35 (*)    Total Protein 6.4 (*)    Albumin 3.4 (*)    GFR calc non Af Amer 55 (*)    All other components within normal limits  CBC WITH DIFFERENTIAL/PLATELET - Abnormal; Notable for the following:    WBC 16.9 (*)    RBC 5.93 (*)    MCV 69.1 (*)    MCH 22.1 (*)    Neutro Abs 13.8 (*)    Monocytes Absolute 1.2 (*)    All other components within normal limits  URINALYSIS, ROUTINE W REFLEX MICROSCOPIC (NOT AT [facility]) - Abnormal; Notable for the following:    Color, Urine AMBER (*)    Specific Gravity, Urine 1.040 (*)    Bilirubin Urine SMALL (*)    Ketones, ur 15 (*)    All other components within normal limits  APTT - Abnormal; Notable for the following:    aPTT 42 (*)    All other components within normal limits  I-STAT VENOUS BLOOD GAS, ED - Abnormal; Notable for the following:    pH, Ven 7.454 (*)    pCO2, Ven 43.2 (*)    pO2, Ven 14.0 (*)     Bicarbonate 30.3 (*)    Acid-Base Excess 6.0 (*)    All other components within normal limits  I-STAT CHEM 8, ED - Abnormal; Notable for the following:    Chloride 97 (*)    BUN 25 (*)    Creatinine, Ser 1.30 (*)    Glucose, Bld 109 (*)    Calcium, Ion 1.05 (*)    All other components within normal limits  CULTURE, BLOOD (ROUTINE X 2)  CULTURE, BLOOD (ROUTINE X 2)  URINE CULTURE  I-STAT CG4 LACTIC ACID, ED  I-STAT CG4 LACTIC ACID, ED    Imaging Review Ct Abdomen Pelvis W Contrast  03/22/2016  CLINICAL DATA:  Generalized lower abdominal pain, recent panniculectomy, history of gastric bypass in 2013 EXAM: CT ABDOMEN AND PELVIS WITH CONTRAST TECHNIQUE: Multidetector CT imaging of the abdomen and pelvis was performed using the standard protocol following bolus administration of intravenous contrast. CONTRAST:  159mL ISOVUE-300 IOPAMIDOL (ISOVUE-300) INJECTION 61% COMPARISON:  12/21/2015 FINDINGS: Lower chest:  Lung bases are clear. Three vessel coronary atherosclerosis. Hepatobiliary: Liver is within normal limits. Gallbladder is unremarkable. No intrahepatic or extrahepatic ductal dilatation. Pancreas: Within normal limits. Spleen: Within normal limits. Adrenals/Urinary Tract: Stable 8 mm left adrenal nodule (series 2/ image 49), unchanged since 2011, likely reflecting a benign adrenal adenoma. Right adrenal gland is within normal limits. 9 mm medial left lower pole renal cyst (series 7/image 30). Right kidney is within normal limits. No hydronephrosis. Bladder is within normal limits. Stomach/Bowel: Stomach is notable for postsurgical changes related to gastric bypass with gastrojejunostomy. No evidence of bowel obstruction. Appendix is not discretely visualized but there is are no findings to suggest acute appendicitis. Moderate stool in the rectum. Vascular/Lymphatic: Atherosclerotic calcifications of the abdominal aorta and branch vessels. No evidence of abdominal aortic  aneurysm. No suspicious  abdominopelvic lymphadenopathy. Reproductive: Prostate is grossly unremarkable. Other: No abdominopelvic ascites. Stranding/postsurgical changes in the subcutaneous fat of the lower anterior abdominal/pelvic wall. Associated 2.9 x 12.6 x 6.7 cm thin subcutaneous fluid collection/seroma (series 2/ image 92) with indwelling bulb drain. These findings are favored to reflect postsurgical changes related to recent panniculectomy. Musculoskeletal: Degenerative changes the visualized thoracolumbar spine. IMPRESSION: Postsurgical changes related to recent panniculectomy in the lower anterior abdominal/pelvic wall, including a 2.9 x 12.6 x 6.7 cm subcutaneous fluid collection/seroma with indwelling bulb drain. Status post gastric bypass.  No evidence of bowel obstruction. Otherwise, no CT findings to account for the patient's lower abdominal pain. Electronically Signed   By: Julian Hy M.D.   On: 03/22/2016 17:14   I have personally reviewed and evaluated these images and lab results as part of my medical decision-making.   EKG Interpretation   Date/Time:  Monday March 22 2016 15:19:44 EDT Ventricular Rate:  68 PR Interval:  159 QRS Duration: 138 QT Interval:  393 QTC Calculation: 418 R Axis:   108 Text Interpretation:  Sinus rhythm Nonspecific intraventricular conduction  delay No acute changes No old tracing to compare Confirmed by Kathrynn Humble,  MD, Thelma Comp [id number]) on 03/22/2016 3:35:03 PM      MDM   Final diagnoses:  Abdominal wall seroma, initial encounter ([place])    Pt with severe abd pain. Arrived hypotensive, but no SIRs on vitals. BP is normal now. We need CT abdomen - evaluate for any post op complications and infection and also ensure there is no internal hernia that are incarcerating post bypass. Perforated viscus also possible.   Varney Biles, MD 03/22/16 1633   7:11 PM Discussed CT findings with Dr. Merri Ray. She recommends outpatient f/u with her clinic on Friday.  Pain control for now.  Return to the ER if fever. Strict ER return precautions have been discussed, and patient is agreeing with the plan and is comfortable with the workup done and the recommendations from the ER.   Varney Biles, MD 03/22/16 [phone]

## 2016-03-22 NOTE — Telephone Encounter (Signed)
Pt request to speak to the assistant. Pt stated that Mr. Herst had a surgery with Dr. Aleene Davidson Hand back in 02/2016.  He went back to see her for server stomach pain, she advise them to go to the ER to get a scan ( pt is not sure which type of scan she want them to do). He call here to see if Dr. Jenny Reichmann can do the scan ( pt stated Dr. Halford Decamp will be faxing over information of what type of scan they need). Advise pt that Dr. Jenny Reichmann is not here today, please call pt

## 2016-03-22 NOTE — ED Notes (Signed)
Pt had abdominoplasty on March 27th-- had gastric bypass in 2013-- has severe abd pain, went to surgeon's office, sent here - is hypotensive.

## 2016-03-22 NOTE — Discharge Instructions (Signed)
Seroma  A seroma is a collection of fluid that looks like swelling or a mass on the body. Seromas form on the body where tissue has been injured or cut. They are most common after surgeries. Seromas vary in size. Some are small and painless. Others may become large and cause pain or discomfort. Many seromas go away on their own; the fluid is naturally absorbed by the body. Some may require the fluid to be drained through medical procedures.   CAUSES   Seromas form as the result of damage to tissue or the removal of tissue. This tissue damage may occur during surgery or because of an injury or trauma. When tissue is disrupted or removed, empty space is created. The body's natural defense system causes fluid to enter the empty space and form a seroma.  SYMPTOMS   · Swelling at the site of a surgical cut (incision) or an injury.  · Drainage of clear fluid at the surgery or injury site.  · Possible discomfort or pain.  DIAGNOSIS   Your health care provider will perform a physical exam. During the exam, the health care provider will press on the seroma using a hand or fingers (palpation). Various tests may be ordered to help confirm the diagnosis. These tests may include:  · Blood tests.  · Imaging tests such as ultrasonography or computed tomography (CT).  TREATMENT   Sometimes seromas resolve on their own and drain naturally in the body. Your health care provider may monitor you to make sure the seroma does not cause any complications.  If your seroma does not resolve on its own, treatment may include:  · Using a needle to drain the fluid from the seroma (needle aspiration).  · Inserting a flexible tube (catheter) to drain the fluid.  · Applying a dressing, such as an elastic bandage or binder.  · Use of antibiotic medicines if the seroma becomes infected.  · In rare cases, surgery may be done to remove the seroma and repair the area.  HOME CARE INSTRUCTIONS  · Follow your health care provider's instructions regarding  activity levels and any limitations on movements.  · Only take over-the-counter or prescription medicines as directed by your health care provider.  · If your health care provider prescribes antibiotics, take them as directed. Finish them even if you start to feel better.  · Check your seroma every day for redness, warmth, or yellow drainage.  · Follow up with your health care provider as directed.  SEEK MEDICAL CARE IF:  · You develop a fever.  · You have pain, tenderness, redness, or warmth at the site of the seroma.  · You notice yellow drainage coming from the site of the seroma.  · Your seroma is getting bigger.     This information is not intended to replace advice given to you by your health care provider. Make sure you discuss any questions you have with your health care provider.     Document Released: 03/19/2013 Document Revised: 12/13/2014 Document Reviewed: 03/19/2013  [employer]vier Interactive Patient Education ©2016 [employer]

## 2016-03-23 LAB — URINE CULTURE

## 2016-03-23 NOTE — Telephone Encounter (Signed)
I reveiwed care everywhere from apr 17 visit with Dr Marla Roe  Pt was referred to ER for what I presume would be CT abd/pelvis for abd pain of uncertain etiology.  At this time, I would not be able to offer that scan without an evaluation in the office at least, but I would defer to Dr Dillingham's judgement regarding need for further evaluation based her exam, and encourage pt to go to ER (either [facility] or [facility], as this would be the best way to have this done asap.  Having this done as an outpatient would take several days and may not be in his best interest to wait that long

## 2016-03-26 ENCOUNTER — Ambulatory Visit: Payer: Self-pay

## 2016-03-26 ENCOUNTER — Ambulatory Visit (INDEPENDENT_AMBULATORY_CARE_PROVIDER_SITE_OTHER): Payer: PPO | Admitting: General Practice

## 2016-03-26 DIAGNOSIS — I2699 Other pulmonary embolism without acute cor pulmonale: Secondary | ICD-10-CM

## 2016-03-26 DIAGNOSIS — Z5181 Encounter for therapeutic drug level monitoring: Secondary | ICD-10-CM

## 2016-03-26 LAB — POCT INR: INR: 6.1

## 2016-03-26 NOTE — Progress Notes (Signed)
Pre visit review using our clinic review tool, if applicable. No additional management support is needed unless otherwise documented below in the visit note. 

## 2016-03-26 NOTE — Progress Notes (Signed)
I have reviewed and agree with the plan. 

## 2016-03-27 LAB — CULTURE, BLOOD (ROUTINE X 2): Culture: NO GROWTH

## 2016-03-28 LAB — CULTURE, BLOOD (ROUTINE X 2): Culture: NO GROWTH

## 2016-03-31 ENCOUNTER — Ambulatory Visit (INDEPENDENT_AMBULATORY_CARE_PROVIDER_SITE_OTHER): Payer: PPO | Admitting: General Practice

## 2016-03-31 DIAGNOSIS — Z5181 Encounter for therapeutic drug level monitoring: Secondary | ICD-10-CM | POA: Diagnosis not present

## 2016-03-31 DIAGNOSIS — I2699 Other pulmonary embolism without acute cor pulmonale: Secondary | ICD-10-CM

## 2016-03-31 LAB — POCT INR: INR: 1.2

## 2016-03-31 NOTE — Progress Notes (Signed)
Pre visit review using our clinic review tool, if applicable. No additional management support is needed unless otherwise documented below in the visit note. 

## 2016-03-31 NOTE — Progress Notes (Signed)
I have reviewed and agree with the plan. 

## 2016-04-14 ENCOUNTER — Ambulatory Visit (INDEPENDENT_AMBULATORY_CARE_PROVIDER_SITE_OTHER): Payer: PPO | Admitting: General Practice

## 2016-04-14 DIAGNOSIS — Z5181 Encounter for therapeutic drug level monitoring: Secondary | ICD-10-CM

## 2016-04-14 DIAGNOSIS — I2699 Other pulmonary embolism without acute cor pulmonale: Secondary | ICD-10-CM

## 2016-04-14 LAB — POCT INR: INR: 4.6

## 2016-04-14 NOTE — Progress Notes (Signed)
I have reviewed and agree with the plan. 

## 2016-04-14 NOTE — Progress Notes (Signed)
Pre visit review using our clinic review tool, if applicable. No additional management support is needed unless otherwise documented below in the visit note. 

## 2016-04-17 ENCOUNTER — Ambulatory Visit: Payer: PPO

## 2016-04-17 ENCOUNTER — Encounter (HOSPITAL_BASED_OUTPATIENT_CLINIC_OR_DEPARTMENT_OTHER): Payer: Self-pay | Admitting: *Deleted

## 2016-04-17 ENCOUNTER — Emergency Department (HOSPITAL_BASED_OUTPATIENT_CLINIC_OR_DEPARTMENT_OTHER)
Admission: EM | Admit: 2016-04-17 | Discharge: 2016-04-17 | Disposition: A | Discharge status: Home / Self Care | Payer: PPO | Attending: Emergency Medicine | Admitting: Emergency Medicine

## 2016-04-17 DIAGNOSIS — F329 Major depressive disorder, single episode, unspecified: Secondary | ICD-10-CM | POA: Diagnosis not present

## 2016-04-17 DIAGNOSIS — J029 Acute pharyngitis, unspecified: Secondary | ICD-10-CM | POA: Diagnosis not present

## 2016-04-17 DIAGNOSIS — Z7984 Long term (current) use of oral hypoglycemic drugs: Secondary | ICD-10-CM | POA: Diagnosis not present

## 2016-04-17 DIAGNOSIS — E119 Type 2 diabetes mellitus without complications: Secondary | ICD-10-CM | POA: Insufficient documentation

## 2016-04-17 DIAGNOSIS — Z79899 Other long term (current) drug therapy: Secondary | ICD-10-CM | POA: Insufficient documentation

## 2016-04-17 DIAGNOSIS — Z87891 Personal history of nicotine dependence: Secondary | ICD-10-CM | POA: Insufficient documentation

## 2016-04-17 DIAGNOSIS — J020 Streptococcal pharyngitis: Secondary | ICD-10-CM | POA: Diagnosis not present

## 2016-04-17 DIAGNOSIS — E785 Hyperlipidemia, unspecified: Secondary | ICD-10-CM | POA: Insufficient documentation

## 2016-04-17 DIAGNOSIS — R05 Cough: Secondary | ICD-10-CM | POA: Diagnosis not present

## 2016-04-17 NOTE — ED Notes (Signed)
DC instructions provided to pt, opportunity for questions provided

## 2016-04-17 NOTE — Discharge Instructions (Signed)
Sore Throat A sore throat is a painful, burning, sore, or scratchy feeling of the throat. There may be pain or tenderness when swallowing or talking. You may have other symptoms with a sore throat. These include coughing, sneezing, fever, or a swollen neck. A sore throat is often the first sign of another sickness. These sicknesses may include a cold, flu, strep throat, or an infection called mono. Most sore throats go away without medical treatment.  HOME CARE   Only take medicine as told by your doctor.  Drink enough fluids to keep your pee (urine) clear or pale yellow.  Rest as needed.  Try using throat sprays, lozenges, or suck on hard candy (if older than 4 years or as told).  Sip warm liquids, such as broth, herbal tea, or warm water with honey. Try sucking on frozen ice pops or drinking cold liquids.  Rinse the mouth (gargle) with salt water. Mix 1 teaspoon salt with 8 ounces of water.  Do not smoke. Avoid being around others when they are smoking.  Put a humidifier in your bedroom at night to moisten the air. You can also turn on a hot shower and sit in the bathroom for 5-10 minutes. Be sure the bathroom door is closed. GET HELP RIGHT AWAY IF:   You have trouble breathing.  You cannot swallow fluids, soft foods, or your spit (saliva).  You have more puffiness (swelling) in the throat.  Your sore throat does not get better in 7 days.  You feel sick to your stomach (nauseous) and throw up (vomit).  You have a fever or lasting symptoms for more than 2-3 days.  You have a fever and your symptoms suddenly get worse. MAKE SURE YOU:   Understand these instructions.  Will watch your condition.  Will get help right away if you are not doing well or get worse.   This information is not intended to replace advice given to you by your health care provider. Make sure you discuss any questions you have with your health care provider.   Document Released: 08/31/2008 Document  Revised: 08/16/2012 Document Reviewed: 07/30/2012 Elsevier Interactive Patient Education 2016 Elsevier Inc[employer]  

## 2016-04-17 NOTE — ED Notes (Signed)
States has been around others that have been sick, VS reviewed

## 2016-04-17 NOTE — ED Provider Notes (Signed)
CSN: [id number]     Arrival date & time 04/17/16  1151 History   First MD Initiated Contact with Patient 04/17/16 1203     Chief Complaint  Patient presents with  . Sore Throat    HPI   60 year old male presents today with complaints of sore throat. Patient reports that over the last 2 days he's had very minor runny nose, and sore throat. Patient reports an intermittent dry nonproductive cough. Patient reports that he was around someone several days ago with similar symptoms, they're symptoms developed into a upper respiratory infection, and family wanted him to be evaluated. Patient reports he's been feeling well, denies any fevers or chills, denies any difficulty with swallowing, no chest pain or shortness of breath, no productive cough. Patient denies any history of acid reflux, status post tonsillectomy. He denies any unintentional weight loss, fevers, night sweats, smoking history.  Past Medical History  Diagnosis Date  . Diabetes mellitus     type II  . Hyperlipidemia   . Hypertension   . DVT, lower extremity ([place])     right  . Embolus ([place]) 04-02-2004    pulmonary  . Depression   . Obesity   . BACK PAIN   . COLONIC POLYPS, HX OF   . DEGENERATIVE JOINT DISEASE, KNEES, BILATERAL   . DEPRESSION   . DIVERTICULOSIS, COLON   . HYPOGONADISM, MALE   . HYPOTENSION, ORTHOSTATIC   . OBESITY, MORBID   . Long term current use of anticoagulant   . Obstructive sleep apnea     uses cpap setting 8 to 10  . Kidney stones   . Renal insufficiency   . RENAL INSUFFICIENCY    Past Surgical History  Procedure Laterality Date  . Knee surgury  03-Apr-1999 & 04-02-02     cartilage damage  . Ankle surgury  2000-04-02 bilateral    with bone spurs and torn tendon  . Edg  04-03-07    chronic Duodenitis  . Tonsillectomy and adenoidectomy  age 47 or 15  . Total knee arthroplasty  01/10/2012    Procedure: TOTAL KNEE ARTHROPLASTY;  Surgeon: [name], MD;  Location: [facility];  Service: Orthopedics;  Laterality: Left;  .  Lumbar laminectomy/decompression microdiscectomy  09/28/2012    Procedure: LUMBAR LAMINECTOMY/DECOMPRESSION MICRODISCECTOMY 2 LEVELS;  Surgeon: [name], MD;  Location: [facility];  Service: Orthopedics;  Laterality: Left;  Decompressive laminectomy L4-L5. Microdiscectomy L5-S1  . Heel spur surgery Bilateral 04/02/04  . Replacement total knee  04-02-2012  . Gastric bypass     Family History  Problem Relation Age of Onset  . Hypertension Mother   . Prostate cancer Father   . Diabetes Sister     died with DM April 02, 2001, 2 more sisters with diabetes  . Heart failure Sister   . Diabetes Brother   . Diabetes Sister   . Diabetes Sister    Social History  Substance Use Topics  . Smoking status: Former Smoker -- 0.25 packs/day for 5 years    Quit date: 12/06/1978  . Smokeless tobacco: Never Used  . Alcohol Use: 0.0 oz/week    0 Standard drinks or equivalent per week     Comment: occasional    Review of Systems  All other systems reviewed and are negative.   Allergies  Adhesive and Other  Home Medications   Prior to Admission medications   Medication Sig Start Date End Date Taking? Authorizing Provider  Artificial Tear Ointment (ARTIFICIAL TEARS) ointment Place 1 drop  into both eyes as needed (for dry eyes).    Historical Provider, MD  aspirin EC 81 MG tablet Take 81 mg by mouth daily.    Historical Provider, MD  atorvastatin (LIPITOR) 80 MG tablet TAKE 1 TABLET (80 MG TOTAL) BY MOUTH DAILY. Patient taking differently: TAKE 1 TABLET (80 MG TOTAL) BY MOUTH every evening 01/05/16   Biagio Borg, MD  Calcium Carbonate (CALCI-CHEW PO) Take 1 tablet by mouth daily.     Historical Provider, MD  Cyanocobalamin (VITAMIN B-12 SL) Place 1 tablet under the tongue daily.    Historical Provider, MD  escitalopram (LEXAPRO) 10 MG tablet Take 1 tablet (10 mg total) by mouth daily. 03/09/16   Biagio Borg, MD  Ferrous Sulfate (IRON) 90 (18 Fe) MG TABS Take 18 mg by mouth daily.    Historical Provider, MD   lidocaine-hydrocortisone (ANAMANTEL HC) 3-0.5 % CREA Place 1 Applicatorful rectally 2 (two) times daily. Patient taking differently: Place 1 Applicatorful rectally daily as needed (for skin irritation).  02/05/16   Binnie Rail, MD  lisinopril (PRINIVIL,ZESTRIL) 5 MG tablet TAKE 1 TABLET (5 MG TOTAL) BY MOUTH DAILY. 01/27/16   Biagio Borg, MD  metFORMIN (GLUCOPHAGE) 500 MG tablet Take 1 tablet (500 mg total) by mouth daily with breakfast. 04/24/15   Renato Shin, MD  Multiple Vitamin (MULTIVITAMIN WITH MINERALS) TABS tablet Take 1 tablet by mouth daily.    Historical Provider, MD  nitroGLYCERIN (NITROSTAT) 0.4 MG SL tablet Place 1 tablet (0.4 mg total) under the tongue every 5 (five) minutes as needed for chest pain. 07/30/15   Dorothy Spark, MD  oxyCODONE (OXYCONTIN) 10 mg 12 hr tablet Take 1 tablet (10 mg total) by mouth every 8 (eight) hours as needed. 03/22/16   Varney Biles, MD  oxyCODONE-acetaminophen (PERCOCET/ROXICET) 5-325 MG tablet Take 1 tablet by mouth every 4 (four) hours as needed for moderate pain.  02/27/16   Historical Provider, MD  polyethylene glycol (MIRALAX / GLYCOLAX) packet Take 17 g by mouth daily as needed for mild constipation.    Historical Provider, MD  pregabalin (LYRICA) 50 MG capsule Take 1 capsule (50 mg total) by mouth 2 (two) times daily. Patient taking differently: Take 50 mg by mouth daily after supper.  10/14/15   Biagio Borg, MD  senna-docusate (SENNA PLUS) 8.6-50 MG tablet Take 1 tablet by mouth daily as needed for mild constipation. Reported on 03/22/2016    Historical Provider, MD  warfarin (COUMADIN) 5 MG tablet Take 1 tablet (5 mg total) by mouth as directed. Patient taking differently: Take 10-15 mg by mouth every morning. 10 mg Monday and Friday. 15 mg all other days 03/09/16   Biagio Borg, MD   BP 126/74 mmHg  Pulse 58  Temp(Src) 97.6 F (36.4 C) (Oral)  Resp 20  Ht 6\' 2"  (1.88 m)  Wt 112.038 kg  BMI 31.70 kg/m2  SpO2 100%   Physical Exam   Constitutional: He is oriented to person, place, and time. He appears well-developed and well-nourished.  HENT:  Head: Normocephalic and atraumatic.  Mouth/Throat: Uvula is midline and mucous membranes are normal. No uvula swelling. No oropharyngeal exudate, posterior oropharyngeal edema or posterior oropharyngeal erythema.  No signs of swelling, edema, redness, vesicles, exudate  Eyes: Conjunctivae are normal. Pupils are equal, round, and reactive to light. Right eye exhibits no discharge. Left eye exhibits no discharge. No scleral icterus.  Neck: Normal range of motion. No JVD present. No tracheal deviation present.  Pulmonary/Chest: Effort normal. No stridor.  Neurological: He is alert and oriented to person, place, and time. Coordination normal.  Psychiatric: He has a normal mood and affect. His behavior is normal. Judgment and thought content normal.  Nursing note and vitals reviewed.   ED Course  Procedures (including critical care time) Labs Review Labs Reviewed - No data to display  Imaging Review No results found. I have personally reviewed and evaluated these images and lab results as part of my medical decision-making.   EKG Interpretation None      MDM   Final diagnoses:  Sore throat    Labs:  Imaging:  Consults:  Therapeutics:  Discharge Meds:   Assessment/Plan: 60 year old male presents today with complaints of sore throat. Patient's symptoms likely allergic, upper respiratory infection. Patient is afebrile, nontoxic in no acute distress. Patient has no abnormal findings in his oral exam, he is feeling well, I have very low suspicion for bacterial etiology, low suspicion for acid reflux. Patient will be instructed to use antihistamines, warm saltwater gargles, monitor for new or worsening signs or symptoms. Patient is instructed to begin using proton pump inhibitor if symptoms do not improve with antihistamines. Patient is instructed follow-up with his  primary care provider for reevaluation. The patient and his wife verbalized understanding and agreement to today's plan and had no further questions or concerns at the time discharge         Okey Regal, PA-C 04/17/16 [place], MD 04/17/16 1451

## 2016-04-17 NOTE — ED Notes (Signed)
MD at bedside. 

## 2016-04-17 NOTE — ED Notes (Signed)
Dry cough, denies fever. Former light smoker, presents primarily with sore throat. Onset in the past two (2) days

## 2016-04-17 NOTE — ED Notes (Signed)
Patient c/o sore throat & non-productive cough for the past three days. No fever, no n/v/d

## 2016-04-20 ENCOUNTER — Ambulatory Visit (INDEPENDENT_AMBULATORY_CARE_PROVIDER_SITE_OTHER): Payer: PPO | Admitting: Ophthalmology

## 2016-04-20 DIAGNOSIS — H33301 Unspecified retinal break, right eye: Secondary | ICD-10-CM | POA: Diagnosis not present

## 2016-04-20 DIAGNOSIS — H43813 Vitreous degeneration, bilateral: Secondary | ICD-10-CM | POA: Diagnosis not present

## 2016-04-20 DIAGNOSIS — H35033 Hypertensive retinopathy, bilateral: Secondary | ICD-10-CM | POA: Diagnosis not present

## 2016-04-20 DIAGNOSIS — I10 Essential (primary) hypertension: Secondary | ICD-10-CM

## 2016-04-21 ENCOUNTER — Ambulatory Visit: Payer: Self-pay | Admitting: Internal Medicine

## 2016-04-21 ENCOUNTER — Ambulatory Visit (INDEPENDENT_AMBULATORY_CARE_PROVIDER_SITE_OTHER): Payer: PPO | Admitting: Family

## 2016-04-21 ENCOUNTER — Encounter: Payer: Self-pay | Admitting: Family

## 2016-04-21 VITALS — BP 110/70 | HR 67 | Temp 98.2°F | Ht 74.0 in | Wt 252.2 lb

## 2016-04-21 DIAGNOSIS — J309 Allergic rhinitis, unspecified: Secondary | ICD-10-CM | POA: Diagnosis not present

## 2016-04-21 MED ORDER — BENZONATATE 100 MG PO CAPS: 100.0000 mg | ORAL_CAPSULE | Freq: Three times a day (TID) | ORAL | Status: DC | PRN

## 2016-04-21 MED ORDER — FLUTICASONE PROPIONATE 50 MCG/ACT NA SUSP
2.0000 | Freq: Every day | NASAL | Status: DC
Start: 2016-04-21 — End: 2018-11-28

## 2016-04-21 NOTE — Progress Notes (Signed)
Pre visit review using our clinic review tool, if applicable. No additional management support is needed unless otherwise documented below in the visit note. 

## 2016-04-21 NOTE — Patient Instructions (Signed)
Suspect seasonal allergies.   Increase intake of clear fluids. Congestion is best treated by hydration, when mucus is wetter, it is thinner, less sticky, and easier to expel from the body, either through coughing up drainage, or by blowing your nose.   Get plenty of rest.   Use saline nasal drops and blow your nose frequently. Run a humidifier at night and elevate the head of the bed. Vicks Vapor rub will help with congestion and cough. Steam showers and sinus massage for congestion.   Use Acetaminophen or Ibuprofen as needed for fever or pain. Avoid second hand smoke. Even the smallest exposure will worsen symptoms.    You can also try a teaspoon of honey to see if this will help reduce cough. Throat lozenges can sometimes be beneficial as well.    This illness will typically last 7 - 10 days.   Please follow up with our clinic if you develop a fever greater than 101 F, symptoms worsen, or do not resolve in the next week.   'Allergic Rhinitis Allergic rhinitis is when the mucous membranes in the nose respond to allergens. Allergens are particles in the air that cause your body to have an allergic reaction. This causes you to release allergic antibodies. Through a chain of events, these eventually cause you to release histamine into the blood stream. Although meant to protect the body, it is this release of histamine that causes your discomfort, such as frequent sneezing, congestion, and an itchy, runny nose.  CAUSES Seasonal allergic rhinitis (hay fever) is caused by pollen allergens that may come from grasses, trees, and weeds. Year-round allergic rhinitis (perennial allergic rhinitis) is caused by allergens such as house dust mites, pet dander, and mold spores. SYMPTOMS  Nasal stuffiness (congestion).  Itchy, runny nose with sneezing and tearing of the eyes. DIAGNOSIS Your health care provider can help you determine the allergen or allergens that trigger your symptoms. If you and your  health care provider are unable to determine the allergen, skin or blood testing may be used. Your health care provider will diagnose your condition after taking your health history and performing a physical exam. Your health care provider may assess you for other related conditions, such as asthma, pink eye, or an ear infection. TREATMENT Allergic rhinitis does not have a cure, but it can be controlled by:  Medicines that block allergy symptoms. These may include allergy shots, nasal sprays, and oral antihistamines.  Avoiding the allergen. Hay fever may often be treated with antihistamines in pill or nasal spray forms. Antihistamines block the effects of histamine. There are over-the-counter medicines that may help with nasal congestion and swelling around the eyes. Check with your health care provider before taking or giving this medicine. If avoiding the allergen or the medicine prescribed do not work, there are many new medicines your health care provider can prescribe. Stronger medicine may be used if initial measures are ineffective. Desensitizing injections can be used if medicine and avoidance does not work. Desensitization is when a patient is given ongoing shots until the body becomes less sensitive to the allergen. Make sure you follow up with your health care provider if problems continue. HOME CARE INSTRUCTIONS It is not possible to completely avoid allergens, but you can reduce your symptoms by taking steps to limit your exposure to them. It helps to know exactly what you are allergic to so that you can avoid your specific triggers. SEEK MEDICAL CARE IF:  You have a fever.  You  develop a cough that does not stop easily (persistent).  You have shortness of breath.  You start wheezing.  Symptoms interfere with normal daily activities.   This information is not intended to replace advice given to you by your health care provider. Make sure you discuss any questions you have with  your health care provider.   Document Released: 08/17/2001 Document Revised: 12/13/2014 Document Reviewed: 07/30/2013 Elsevier Interactive Patient Education [employer].

## 2016-04-21 NOTE — Progress Notes (Signed)
Subjective:    Patient ID: [name], male    DOB: [birth year], 60 y.o.   MRN: [id number]   [name] is a 60 y.o. male who presents today for an acute visit.    HPI Comments: Patient here for evaluation of sore throat x one week. Endorses sinus congestion, productive cough with thin, runny, mucous.  Seen in ED 4 days ago and told to take antihistamine without relief.  Has taken OTC alka selzer cold and sinus with some relief. No asthma, COPD. Not a smoker. Seasonal allergies.   Past Medical History  Diagnosis Date  . Diabetes mellitus     type II  . Hyperlipidemia   . Hypertension   . DVT, lower extremity ([place])     right  . Embolus ([place]) 2005    pulmonary  . Depression   . Obesity   . BACK PAIN   . COLONIC POLYPS, HX OF   . DEGENERATIVE JOINT DISEASE, KNEES, BILATERAL   . DEPRESSION   . DIVERTICULOSIS, COLON   . HYPOGONADISM, MALE   . HYPOTENSION, ORTHOSTATIC   . OBESITY, MORBID   . Long term current use of anticoagulant   . Obstructive sleep apnea     uses cpap setting 8 to 10  . Kidney stones   . Renal insufficiency   . RENAL INSUFFICIENCY    Allergies: Adhesive and Other Current Outpatient Prescriptions on File Prior to Visit  Medication Sig Dispense Refill  . Artificial Tear Ointment (ARTIFICIAL TEARS) ointment Place 1 drop into both eyes as needed (for dry eyes).    Marland Kitchen aspirin EC 81 MG tablet Take 81 mg by mouth daily.    Marland Kitchen atorvastatin (LIPITOR) 80 MG tablet TAKE 1 TABLET (80 MG TOTAL) BY MOUTH DAILY. (Patient taking differently: TAKE 1 TABLET (80 MG TOTAL) BY MOUTH every evening) 90 tablet 3  . Calcium Carbonate (CALCI-CHEW PO) Take 1 tablet by mouth daily.     . Cyanocobalamin (VITAMIN B-12 SL) Place 1 tablet under the tongue daily.    Marland Kitchen escitalopram (LEXAPRO) 10 MG tablet Take 1 tablet (10 mg total) by mouth daily. 90 tablet 1  . Ferrous Sulfate (IRON) 90 (18 Fe) MG TABS Take 18 mg by mouth daily.    Marland Kitchen lidocaine-hydrocortisone (ANAMANTEL HC) 3-0.5  % CREA Place 1 Applicatorful rectally 2 (two) times daily. (Patient taking differently: Place 1 Applicatorful rectally daily as needed (for skin irritation). ) 28.3 g 0  . lisinopril (PRINIVIL,ZESTRIL) 5 MG tablet TAKE 1 TABLET (5 MG TOTAL) BY MOUTH DAILY. 90 tablet 1  . metFORMIN (GLUCOPHAGE) 500 MG tablet Take 1 tablet (500 mg total) by mouth daily with breakfast. 180 tablet 3  . Multiple Vitamin (MULTIVITAMIN WITH MINERALS) TABS tablet Take 1 tablet by mouth daily.    . nitroGLYCERIN (NITROSTAT) 0.4 MG SL tablet Place 1 tablet (0.4 mg total) under the tongue every 5 (five) minutes as needed for chest pain. 90 tablet 3  . oxyCODONE (OXYCONTIN) 10 mg 12 hr tablet Take 1 tablet (10 mg total) by mouth every 8 (eight) hours as needed. 12 tablet 0  . oxyCODONE-acetaminophen (PERCOCET/ROXICET) 5-325 MG tablet Take 1 tablet by mouth every 4 (four) hours as needed for moderate pain.   0  . polyethylene glycol (MIRALAX / GLYCOLAX) packet Take 17 g by mouth daily as needed for mild constipation.    . pregabalin (LYRICA) 50 MG capsule Take 1 capsule (50 mg total) by mouth 2 (two) times daily. (Patient taking  differently: Take 50 mg by mouth daily after supper. ) 60 capsule 5  . senna-docusate (SENNA PLUS) 8.6-50 MG tablet Take 1 tablet by mouth daily as needed for mild constipation. Reported on 03/22/2016    . warfarin (COUMADIN) 5 MG tablet Take 1 tablet (5 mg total) by mouth as directed. (Patient taking differently: Take 10-15 mg by mouth every morning. 10 mg Monday and Friday. 15 mg all other days) 180 tablet 1   No current facility-administered medications on file prior to visit.    Social History  Substance Use Topics  . Smoking status: Former Smoker -- 0.25 packs/day for 5 years    Quit date: 12/06/1978  . Smokeless tobacco: Never Used  . Alcohol Use: 0.0 oz/week    0 Standard drinks or equivalent per week     Comment: occasional    Review of Systems  Constitutional: Negative for fever and  chills.  HENT: Positive for congestion, postnasal drip, rhinorrhea, sinus pressure and sore throat.   Respiratory: Positive for cough. Negative for shortness of breath and wheezing.   Cardiovascular: Negative for chest pain and palpitations.  Gastrointestinal: Negative for nausea.      Objective:    BP 110/70 mmHg  Pulse 67  Temp(Src) 98.2 F (36.8 C) (Oral)  Ht 6\' 2"  (1.88 m)  Wt 252 lb 4 oz (114.42 kg)  BMI 32.37 kg/m2  SpO2 97%   Physical Exam  Constitutional: Vital signs are normal. He appears well-developed and well-nourished.  HENT:  Head: Normocephalic and atraumatic.  Right Ear: Hearing, tympanic membrane, external ear and ear canal normal. No drainage, swelling or tenderness. Tympanic membrane is not injected, not erythematous and not bulging. No middle ear effusion. No decreased hearing is noted.  Left Ear: Hearing, tympanic membrane, external ear and ear canal normal. No drainage, swelling or tenderness. Tympanic membrane is not injected, not erythematous and not bulging.  No middle ear effusion. No decreased hearing is noted.  Nose: Rhinorrhea present. Right sinus exhibits no maxillary sinus tenderness and no frontal sinus tenderness. Left sinus exhibits no maxillary sinus tenderness and no frontal sinus tenderness.  Mouth/Throat: Uvula is midline, oropharynx is clear and moist and mucous membranes are normal. No oropharyngeal exudate, posterior oropharyngeal edema, posterior oropharyngeal erythema or tonsillar abscesses.  Eyes: Conjunctivae are normal.  Cardiovascular: Regular rhythm and normal heart sounds.   Pulmonary/Chest: Effort normal and breath sounds normal. No respiratory distress. He has no wheezes. He has no rhonchi. He has no rales.  Lymphadenopathy:       Head (right side): No submental, no submandibular, no tonsillar, no preauricular, no posterior auricular and no occipital adenopathy present.       Head (left side): No submental, no submandibular, no  tonsillar, no preauricular, no posterior auricular and no occipital adenopathy present.    He has no cervical adenopathy.  Neurological: He is alert.  Skin: Skin is warm and dry.  Psychiatric: He has a normal mood and affect. His speech is normal and behavior is normal.  Vitals reviewed.      Assessment & Plan:    1. Allergic rhinitis, unspecified allergic rhinitis type Patient and I agreed on conservative therapy at this time. Patient is afebrile. No evidence of bacterial infection at this time.  - fluticasone (FLONASE) 50 MCG/ACT nasal spray; Place 2 sprays into both nostrils daily.  Dispense: 16 g; Refill: 2 - benzonatate (TESSALON PERLES) 100 MG capsule; Take 1 capsule (100 mg total) by mouth 3 (three) times daily as  needed for cough.  Dispense: 30 capsule; Refill: 1   I am having Mr. Ciampi maintain his metFORMIN, nitroGLYCERIN, pregabalin, aspirin EC, senna-docusate, Iron, Cyanocobalamin (VITAMIN B-12 SL), multivitamin with minerals, Calcium Carbonate (CALCI-CHEW PO), atorvastatin, lisinopril, lidocaine-hydrocortisone, warfarin, escitalopram, polyethylene glycol, artificial tears, oxyCODONE-acetaminophen, and oxyCODONE.   No orders of the defined types were placed in this encounter.     Start medications as prescribed and explained to patient on After Visit Summary ( AVS). Risks, benefits, and alternatives of the medications and treatment plan prescribed today were discussed, and patient expressed understanding.   Education regarding symptom management and diagnosis given to patient.   Follow-up:Plan follow-up as discussed or as needed if any worsening symptoms or change in condition.   Continue to follow with Cathlean Cower, MD for routine health maintenance.   Amie Critchley and I agreed with plan.   Mable Paris, FNP

## 2016-04-30 IMAGING — CR DG CHEST 2V
2 series · 2 of 2 positions shown · non-contrast
Comparison: 01/03/2014

CLINICAL DATA: Mid chest pain and shortness of breath.
Hypertension. Diabetes.

EXAM:
CHEST  2 VIEW

[chest pa]
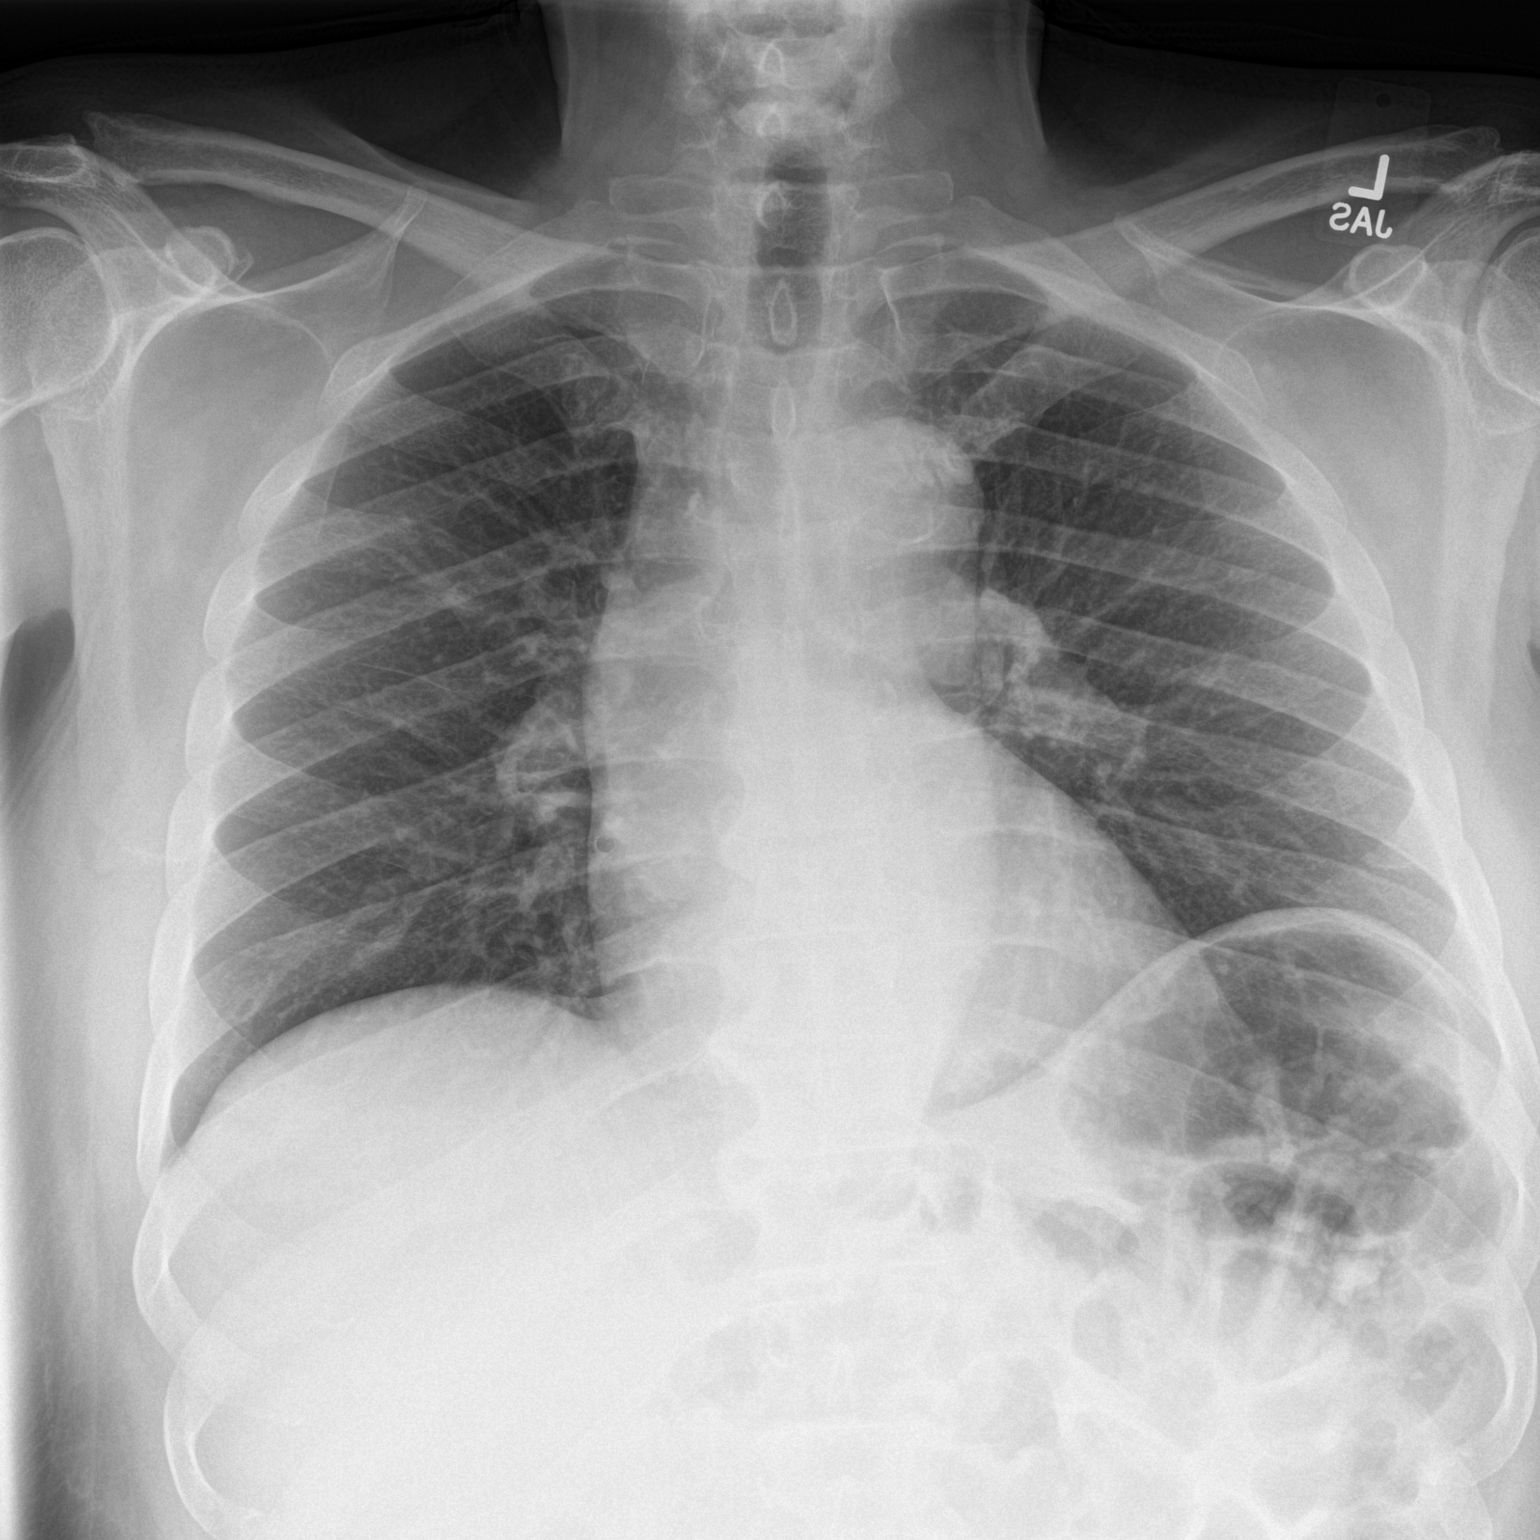

[chest lat]
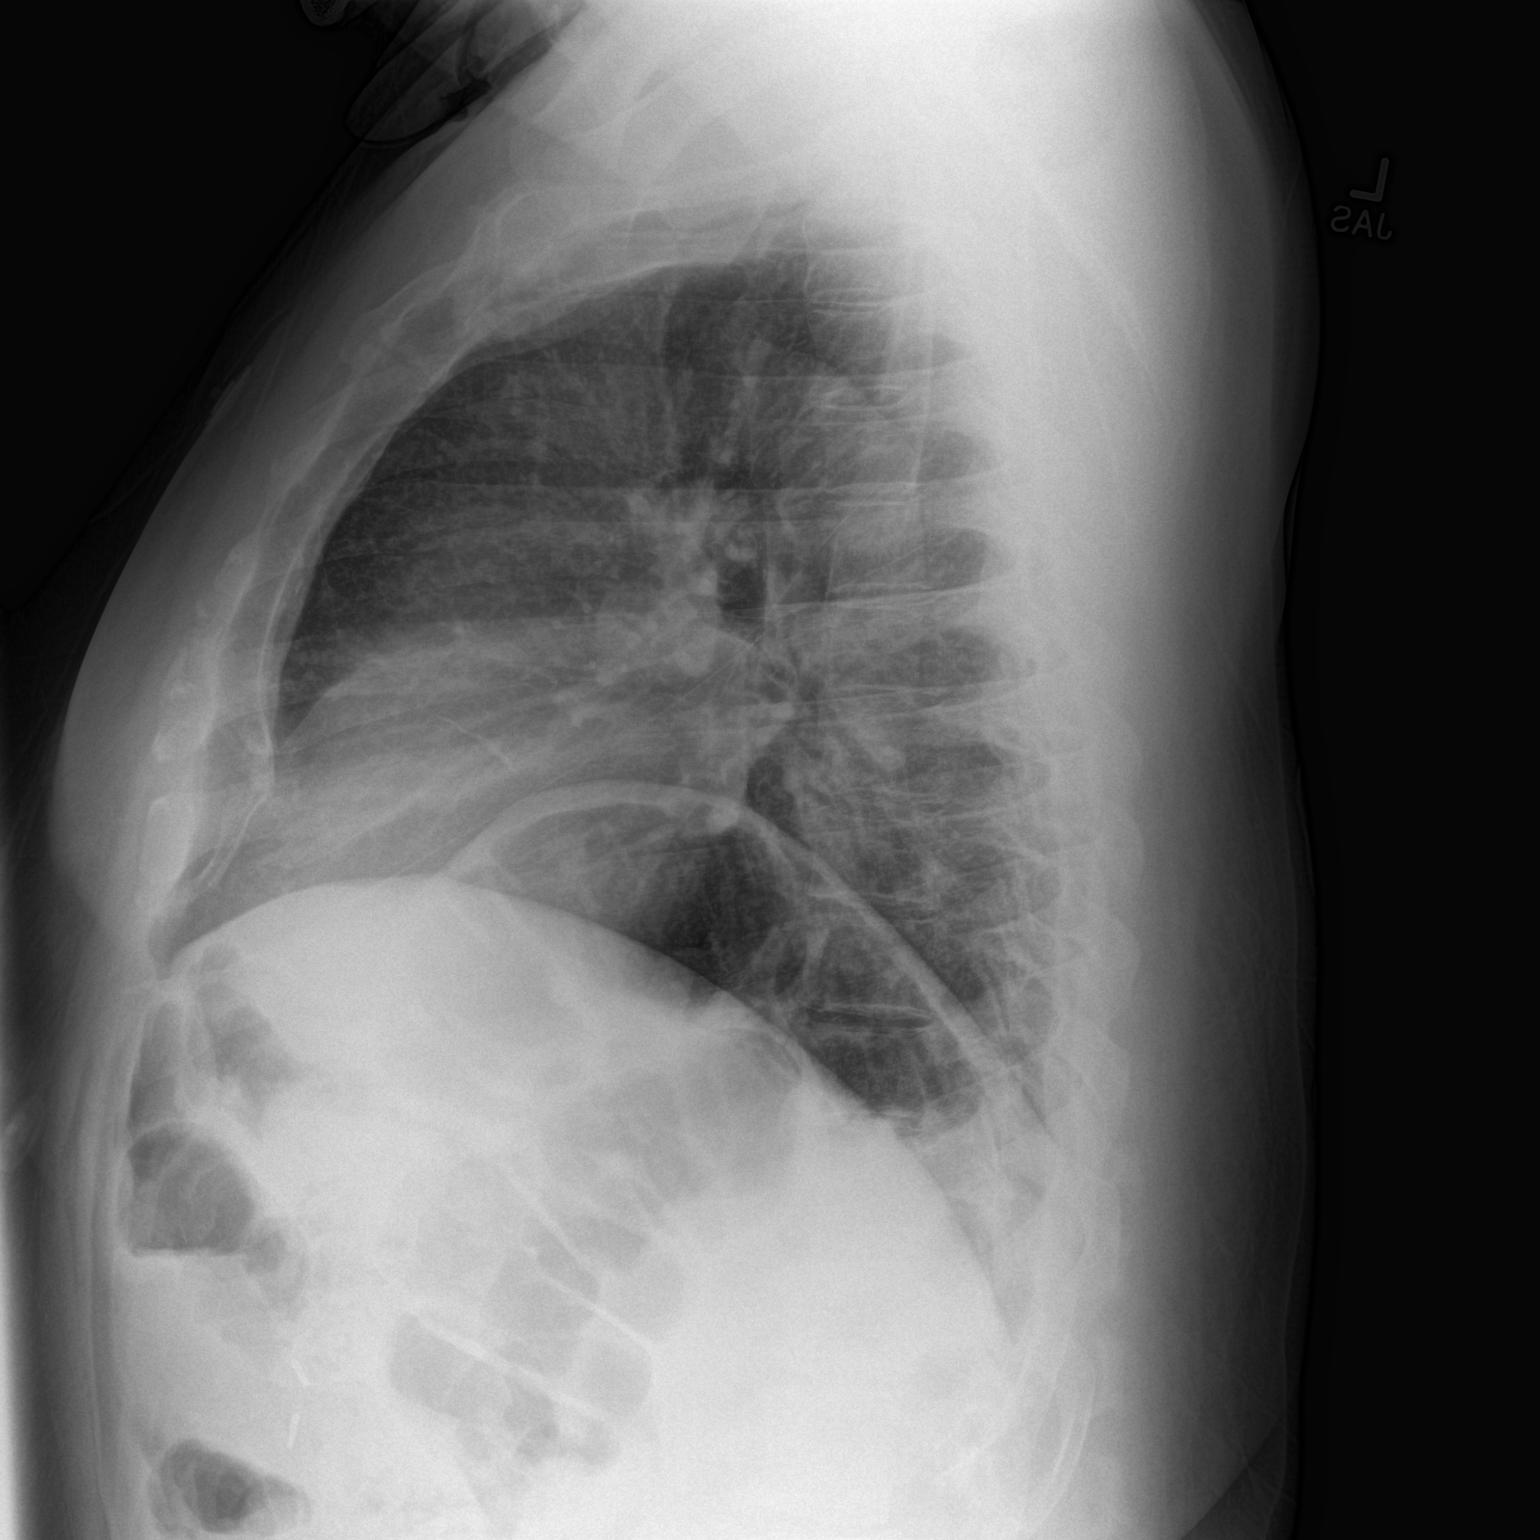

[2 of 2 positions shown; findings below may reference images not displayed]

FINDINGS: Mild left hemidiaphragm eventration. Midline trachea. Normal heart
size. Tortuous thoracic aorta with transverse aortic
atherosclerosis. No pleural effusion or pneumothorax. No congestive
failure. Clear lungs.
IMPRESSION: No acute cardiopulmonary disease.

Aortic atherosclerosis.

## 2016-05-07 ENCOUNTER — Ambulatory Visit: Payer: Self-pay

## 2016-05-19 ENCOUNTER — Ambulatory Visit (INDEPENDENT_AMBULATORY_CARE_PROVIDER_SITE_OTHER): Payer: PPO | Admitting: General Practice

## 2016-05-19 DIAGNOSIS — Z5181 Encounter for therapeutic drug level monitoring: Secondary | ICD-10-CM | POA: Diagnosis not present

## 2016-05-19 LAB — POCT INR: INR: 4

## 2016-05-19 NOTE — Progress Notes (Signed)
Pre visit review using our clinic review tool, if applicable. No additional management support is needed unless otherwise documented below in the visit note. 

## 2016-05-22 NOTE — Progress Notes (Signed)
I agree with this plan.

## 2016-06-02 ENCOUNTER — Ambulatory Visit (INDEPENDENT_AMBULATORY_CARE_PROVIDER_SITE_OTHER): Payer: PPO | Admitting: General Practice

## 2016-06-02 DIAGNOSIS — Z5181 Encounter for therapeutic drug level monitoring: Secondary | ICD-10-CM

## 2016-06-02 DIAGNOSIS — I2699 Other pulmonary embolism without acute cor pulmonale: Secondary | ICD-10-CM

## 2016-06-02 LAB — POCT INR: INR: 1.8

## 2016-06-02 NOTE — Progress Notes (Signed)
Pre visit review using our clinic review tool, if applicable. No additional management support is needed unless otherwise documented below in the visit note. 

## 2016-06-02 NOTE — Progress Notes (Signed)
I agree with this plan.

## 2016-06-24 ENCOUNTER — Other Ambulatory Visit (INDEPENDENT_AMBULATORY_CARE_PROVIDER_SITE_OTHER): Payer: Self-pay

## 2016-06-24 ENCOUNTER — Encounter: Payer: Self-pay | Admitting: Internal Medicine

## 2016-06-24 ENCOUNTER — Ambulatory Visit (INDEPENDENT_AMBULATORY_CARE_PROVIDER_SITE_OTHER): Payer: PPO | Admitting: Internal Medicine

## 2016-06-24 VITALS — BP 140/80 | HR 55 | Temp 98.3°F | Resp 20 | Wt 245.0 lb

## 2016-06-24 DIAGNOSIS — Z0001 Encounter for general adult medical examination with abnormal findings: Secondary | ICD-10-CM

## 2016-06-24 DIAGNOSIS — M25561 Pain in right knee: Secondary | ICD-10-CM | POA: Insufficient documentation

## 2016-06-24 DIAGNOSIS — M25511 Pain in right shoulder: Secondary | ICD-10-CM | POA: Diagnosis not present

## 2016-06-24 DIAGNOSIS — I10 Essential (primary) hypertension: Secondary | ICD-10-CM | POA: Diagnosis not present

## 2016-06-24 DIAGNOSIS — E119 Type 2 diabetes mellitus without complications: Secondary | ICD-10-CM

## 2016-06-24 DIAGNOSIS — R6889 Other general symptoms and signs: Secondary | ICD-10-CM

## 2016-06-24 DIAGNOSIS — E785 Hyperlipidemia, unspecified: Secondary | ICD-10-CM

## 2016-06-24 LAB — CBC WITH DIFFERENTIAL/PLATELET
BASOS PCT: 0.3 % (ref 0.0–3.0)
Basophils Absolute: 0 10*3/uL (ref 0.0–0.1)
EOS ABS: 0.2 10*3/uL (ref 0.0–0.7)
Eosinophils Relative: 2.1 % (ref 0.0–5.0)
HEMATOCRIT: 40.8 % (ref 39.0–52.0)
HEMOGLOBIN: 12.7 g/dL — AB (ref 13.0–17.0)
LYMPHS PCT: 31.1 % (ref 12.0–46.0)
Lymphs Abs: 2.2 10*3/uL (ref 0.7–4.0)
MCHC: 31.1 g/dL (ref 30.0–36.0)
MCV: 70.3 fl — AB (ref 78.0–100.0)
MONOS PCT: 7.8 % (ref 3.0–12.0)
Monocytes Absolute: 0.6 10*3/uL (ref 0.1–1.0)
NEUTROS ABS: 4.2 10*3/uL (ref 1.4–7.7)
Neutrophils Relative %: 58.7 % (ref 43.0–77.0)
Platelets: 210 10*3/uL (ref 150.0–400.0)
RBC: 5.8 Mil/uL (ref 4.22–5.81)
RDW: 18.1 % — AB (ref 11.5–15.5)
WBC: 7.2 10*3/uL (ref 4.0–10.5)

## 2016-06-24 LAB — URINALYSIS, ROUTINE W REFLEX MICROSCOPIC
Bilirubin Urine: NEGATIVE
HGB URINE DIPSTICK: NEGATIVE
KETONES UR: NEGATIVE
Leukocytes, UA: NEGATIVE
NITRITE: NEGATIVE
RBC / HPF: NONE SEEN (ref 0–?)
Specific Gravity, Urine: 1.02 (ref 1.000–1.030)
Total Protein, Urine: NEGATIVE
URINE GLUCOSE: NEGATIVE
UROBILINOGEN UA: 1 (ref 0.0–1.0)
pH: 6 (ref 5.0–8.0)

## 2016-06-24 LAB — BASIC METABOLIC PANEL
BUN: 21 mg/dL (ref 6–23)
CHLORIDE: 104 meq/L (ref 96–112)
CO2: 29 mEq/L (ref 19–32)
Calcium: 9.6 mg/dL (ref 8.4–10.5)
Creatinine, Ser: 1.02 mg/dL (ref 0.40–1.50)
GFR: 95.01 mL/min (ref >60.00)
Glucose, Bld: 82 mg/dL (ref 70–99)
POTASSIUM: 4.3 meq/L (ref 3.5–5.1)
SODIUM: 140 meq/L (ref 135–145)

## 2016-06-24 LAB — MICROALBUMIN / CREATININE URINE RATIO
Creatinine,U: 183.2 mg/dL
MICROALB UR: 1.1 mg/dL (ref 0.0–1.9)
Microalb Creat Ratio: 0.6 mg/g (ref 0.0–30.0)

## 2016-06-24 LAB — HEPATIC FUNCTION PANEL
ALK PHOS: 84 U/L (ref 39–117)
ALT: 26 U/L (ref 0–53)
AST: 24 U/L (ref 0–37)
Albumin: 4.5 g/dL (ref 3.5–5.2)
BILIRUBIN DIRECT: 0.2 mg/dL (ref 0.0–0.3)
BILIRUBIN TOTAL: 0.9 mg/dL (ref 0.2–1.2)
Total Protein: 7.2 g/dL (ref 6.0–8.3)

## 2016-06-24 LAB — PSA: PSA: 1.11 ng/mL (ref 0.10–4.00)

## 2016-06-24 LAB — LIPID PANEL
CHOL/HDL RATIO: 2
Cholesterol: 122 mg/dL (ref 0–200)
HDL: 49.3 mg/dL (ref >39.00)
LDL CALC: 55 mg/dL (ref 0–99)
NONHDL: 73.07
TRIGLYCERIDES: 89 mg/dL (ref 0.0–149.0)
VLDL: 17.8 mg/dL (ref 0.0–40.0)

## 2016-06-24 LAB — TSH: TSH: 1.8 u[IU]/mL (ref 0.35–4.50)

## 2016-06-24 LAB — HEMOGLOBIN A1C: Hgb A1c MFr Bld: 6 % (ref 4.6–6.5)

## 2016-06-24 NOTE — Patient Instructions (Signed)
.  Please continue all other medications as before, and refills have been done if requested.  Please have the pharmacy call with any other refills you may need.  Please continue your efforts at being more active, low cholesterol diet, and weight control.  You are otherwise up to date with prevention measures today.  Please keep your appointments with your specialists as you may have planned  You will be contacted regarding the referral for: Dr Tamala Julian for the right shoulder and knee  Please go to the LAB in the Basement (turn left off the elevator) for the tests to be done today  You will be contacted by phone if any changes need to be made immediately.  Otherwise, you will receive a letter about your results with an explanation, but please check with MyChart first.  Please remember to sign up for MyChart if you have not done so, as this will be important to you in the future with finding out test results, communicating by private email, and scheduling acute appointments online when needed.  Please return in 6 months, or sooner if needed, with Lab testing done 3-5 days before

## 2016-06-24 NOTE — Progress Notes (Signed)
Subjective:    Patient ID: [name], male    DOB: [birth year], 60 y.o.   MRN: [id number]  HPI  Here to f/u; overall doing ok,  Pt denies chest pain, increasing sob or doe, wheezing, orthopnea, PND, increased LE swelling, palpitations, dizziness or syncope.  Pt denies new neurological symptoms such as new headache, or facial or extremity weakness or numbness.  Pt denies polydipsia, polyuria, or low sugar episode.   Pt denies new neurological symptoms such as new headache, or facial or extremity weakness or numbness.   Pt states overall good compliance with meds, mostly trying to follow appropriate diet, with wt overall stable,  but little exercise however. S/p recent panniculectomy, uncomplicated, doing well. Lost alomost 20 lbs in 6 mo.   Wt Readings from Last 3 Encounters:  06/24/16 245 lb (111.131 kg)  04/21/16 252 lb 4 oz (114.42 kg)  04/17/16 247 lb (112.038 kg)  No longer sees Dr Loanne Drilling for DM as has been doing well.  Did have mild AKI periop - cr 1.3.  Today c/o right shoulder and right knee pain in the past wk, without fall or injury, no fever, swelling just aching type pain, mild to mod, constant, hard to sleep on right side or use right arm. No leg giveaways or falls Past Medical History:  Diagnosis Date  . BACK PAIN   . COLONIC POLYPS, HX OF   . DEGENERATIVE JOINT DISEASE, KNEES, BILATERAL   . Depression   . DEPRESSION   . Diabetes mellitus    type II  . DIVERTICULOSIS, COLON   . DVT, lower extremity ([place])    right  . Embolus ([place]) 2005   pulmonary  . Hyperlipidemia   . Hypertension   . HYPOGONADISM, MALE   . HYPOTENSION, ORTHOSTATIC   . Kidney stones   . Long term current use of anticoagulant   . Obesity   . OBESITY, MORBID   . Obstructive sleep apnea    uses cpap setting 8 to 10  . Renal insufficiency   . RENAL INSUFFICIENCY    Past Surgical History:  Procedure Laterality Date  . ankle surgury  2001 bilateral   with bone spurs and torn tendon  . EDG  2008    chronic Duodenitis  . GASTRIC BYPASS    . HEEL SPUR SURGERY Bilateral 2005  . knee surgury  2000 & 2003    cartilage damage  . LUMBAR LAMINECTOMY/DECOMPRESSION MICRODISCECTOMY  09/28/2012   Procedure: LUMBAR LAMINECTOMY/DECOMPRESSION MICRODISCECTOMY 2 LEVELS;  Surgeon: [name], MD;  Location: [facility];  Service: Orthopedics;  Laterality: Left;  Decompressive laminectomy L4-L5. Microdiscectomy L5-S1  . REPLACEMENT TOTAL KNEE  2013  . TONSILLECTOMY AND ADENOIDECTOMY  age 38 or 56  . TOTAL KNEE ARTHROPLASTY  01/10/2012   Procedure: TOTAL KNEE ARTHROPLASTY;  Surgeon: [name], MD;  Location: [facility];  Service: Orthopedics;  Laterality: Left;    reports that he quit smoking about 37 years ago. He has a 1.25 pack-year smoking history. He has never used smokeless tobacco. He reports that he drinks alcohol. He reports that he does not use drugs. family history includes Diabetes in his brother, sister, sister, and sister; Heart failure in his sister; Hypertension in his mother; Prostate cancer in his father. Allergies  Allergen Reactions  . Adhesive [Tape] Other (See Comments)    Painful, Please use "paper" tape  . Other     NO BLOOD -JEHOVAH'S WITNESS-SIGNED REFUSAL BUT WOULD TAKE ALBUMIN   Current  Outpatient Prescriptions on File Prior to Visit  Medication Sig Dispense Refill  . Artificial Tear Ointment (ARTIFICIAL TEARS) ointment Place 1 drop into both eyes as needed (for dry eyes).    Marland Kitchen aspirin EC 81 MG tablet Take 81 mg by mouth daily.    Marland Kitchen atorvastatin (LIPITOR) 80 MG tablet TAKE 1 TABLET (80 MG TOTAL) BY MOUTH DAILY. (Patient taking differently: TAKE 1 TABLET (80 MG TOTAL) BY MOUTH every evening) 90 tablet 3  . Calcium Carbonate (CALCI-CHEW PO) Take 1 tablet by mouth daily.     . Cyanocobalamin (VITAMIN B-12 SL) Place 1 tablet under the tongue daily.    Marland Kitchen escitalopram (LEXAPRO) 10 MG tablet Take 1 tablet (10 mg total) by mouth daily. 90 tablet 1  . Ferrous Sulfate (IRON) 90 (18  Fe) MG TABS Take 18 mg by mouth daily.    . fluticasone (FLONASE) 50 MCG/ACT nasal spray Place 2 sprays into both nostrils daily. 16 g 2  . lisinopril (PRINIVIL,ZESTRIL) 5 MG tablet TAKE 1 TABLET (5 MG TOTAL) BY MOUTH DAILY. 90 tablet 1  . metFORMIN (GLUCOPHAGE) 500 MG tablet Take 1 tablet (500 mg total) by mouth daily with breakfast. 180 tablet 3  . Multiple Vitamin (MULTIVITAMIN WITH MINERALS) TABS tablet Take 1 tablet by mouth daily.    . nitroGLYCERIN (NITROSTAT) 0.4 MG SL tablet Place 1 tablet (0.4 mg total) under the tongue every 5 (five) minutes as needed for chest pain. 90 tablet 3  . polyethylene glycol (MIRALAX / GLYCOLAX) packet Take 17 g by mouth daily as needed for mild constipation.    . pregabalin (LYRICA) 50 MG capsule Take 1 capsule (50 mg total) by mouth 2 (two) times daily. (Patient taking differently: Take 50 mg by mouth daily after supper. ) 60 capsule 5  . senna-docusate (SENNA PLUS) 8.6-50 MG tablet Take 1 tablet by mouth daily as needed for mild constipation. Reported on 03/22/2016    . warfarin (COUMADIN) 5 MG tablet Take 1 tablet (5 mg total) by mouth as directed. (Patient taking differently: Take 10-15 mg by mouth every morning. 10 mg Monday and Friday. 15 mg all other days) 180 tablet 1   No current facility-administered medications on file prior to visit.    Review of Systems  Constitutional: Negative for unusual diaphoresis or night sweats HENT: Negative for ear swelling or discharge Eyes: Negative for worsening visual haziness  Respiratory: Negative for choking and stridor.   Gastrointestinal: Negative for distension or worsening eructation Genitourinary: Negative for retention or change in urine volume.  Musculoskeletal: Negative for other MSK pain or swelling Skin: Negative for color change and worsening wound Neurological: Negative for tremors and numbness other than noted  Psychiatric/Behavioral: Negative for decreased concentration or agitation other than  above       Objective:   Physical Exam BP 140/80   Pulse (!) 55   Temp 98.3 F (36.8 C) (Oral)   Resp 20   Wt 245 lb (111.1 kg)   SpO2 96%   BMI 31.46 kg/m   VS noted,  Constitutional: Pt appears in no apparent distress HENT: Head: NCAT.  Right Ear: External ear normal.  Left Ear: External ear normal.  Eyes: . Pupils are equal, round, and reactive to light. Conjunctivae and EOM are normal Neck: Normal range of motion. Neck supple.  Cardiovascular: Normal rate and regular rhythm.   Pulmonary/Chest: Effort normal and breath sounds without rales or wheezing.  Abd:  Soft, NT, ND, + BS Neurological: Pt is alert.  Not confused , motor grossly intact Skin: Skin is warm. No rash, no LE edema Psychiatric: Pt behavior is normal. No agitation.  Right shoulder with reduced ROM to abduction to 100 degrees Right knee with bony degenerative changes, no effusion or tender   Assessment & Plan:

## 2016-06-24 NOTE — Progress Notes (Signed)
Pre visit review using our clinic review tool, if applicable. No additional management support is needed unless otherwise documented below in the visit note. 

## 2016-06-27 NOTE — Assessment & Plan Note (Signed)
stable overall by history and exam, recent data reviewed with pt, and pt to continue medical treatment as before,  to f/u any worsening symptoms or concerns Lab Results  Component Value Date   HGBA1C 6.0 06/24/2016

## 2016-06-27 NOTE — Assessment & Plan Note (Signed)
?   Etiology, for sport med referral/dr smith in this office

## 2016-06-27 NOTE — Assessment & Plan Note (Signed)
stable overall by history and exam, recent data reviewed with pt, and pt to continue medical treatment as before,  to f/u any worsening symptoms or concerns BP Readings from Last 3 Encounters:  06/24/16 140/80  04/21/16 110/70  04/17/16 126/74

## 2016-06-27 NOTE — Assessment & Plan Note (Signed)
stable overall by history and exam, recent data reviewed with pt, and pt to continue medical treatment as before,  to f/u any worsening symptoms or concerns Lab Results  Component Value Date   LDLCALC 55 06/24/2016

## 2016-06-28 ENCOUNTER — Ambulatory Visit (INDEPENDENT_AMBULATORY_CARE_PROVIDER_SITE_OTHER): Payer: PPO

## 2016-06-28 DIAGNOSIS — Z23 Encounter for immunization: Secondary | ICD-10-CM

## 2016-06-28 DIAGNOSIS — Z0000 Encounter for general adult medical examination without abnormal findings: Secondary | ICD-10-CM | POA: Diagnosis not present

## 2016-06-28 NOTE — Patient Instructions (Addendum)
[name] , Thank you for taking time to come for your Medicare Wellness Visit. I appreciate your ongoing commitment to your health goals. Please review the following plan we discussed and let me know if I can assist you in the future.   Good Luck on your continued weight loss  Will take PSV 23 today You will have one more in 5 years after you turn 63!  Keep up the great work!  These are the goals we discussed: Goals    . Exercise 3x per week (30 min per time)          Hopes to stay active; will focus on keeping back strong; to fup with PT regarding exercises recommended to keep the back strong. Golf for fun     . Weight (lb) < 225 lb (102.1 kg)          Continue with healthy regiment; still seeing weight loss on current regiment; Every 2 months approx 2 lbs;        This is a list of the screening recommended for you and due dates:  Health Maintenance  Topic Date Due  . HIV Screening  12/18/1968  . Pneumococcal vaccine (2) 03/04/2013  . Flu Shot  07/06/2016  . Complete foot exam   07/24/2016  . Eye exam for diabetics  11/03/2016  . Hemoglobin A1C  12/25/2016  . Tetanus Vaccine  03/12/2019  . Colon Cancer Screening  03/18/2020  . Shingles Vaccine  Completed  .  Hepatitis C: One time screening is recommended by Center for Disease Control  (CDC) for  adults born from [birth year] through 1967.   Completed     Fall Prevention in the Home  Falls can cause injuries. They can happen to people of all ages. There are many things you can do to make your home safe and to help prevent falls.  WHAT CAN I DO ON THE OUTSIDE OF MY HOME?  Regularly fix the edges of walkways and driveways and fix any cracks.  Remove anything that might make you trip as you walk through a door, such as a raised step or threshold.  Trim any bushes or trees on the path to your home.  Use bright outdoor lighting.  Clear any walking paths of anything that might make someone trip, such as rocks or  tools.  Regularly check to see if handrails are loose or broken. Make sure that both sides of any steps have handrails.  Any raised decks and porches should have guardrails on the edges.  Have any leaves, snow, or ice cleared regularly.  Use sand or salt on walking paths during winter.  Clean up any spills in your garage right away. This includes oil or grease spills. WHAT CAN I DO IN THE BATHROOM?   Use night lights.  Install grab bars by the toilet and in the tub and shower. Do not use towel bars as grab bars.  Use non-skid mats or decals in the tub or shower.  If you need to sit down in the shower, use a plastic, non-slip stool.  Keep the floor dry. Clean up any water that spills on the floor as soon as it happens.  Remove soap buildup in the tub or shower regularly.  Attach bath mats securely with double-sided non-slip rug tape.  Do not have throw rugs and other things on the floor that can make you trip. WHAT CAN I DO IN THE BEDROOM?  Use night lights.  Make sure that you  have a light by your bed that is easy to reach.  Do not use any sheets or blankets that are too big for your bed. They should not hang down onto the floor.  Have a firm chair that has side arms. You can use this for support while you get dressed.  Do not have throw rugs and other things on the floor that can make you trip. WHAT CAN I DO IN THE KITCHEN?  Clean up any spills right away.  Avoid walking on wet floors.  Keep items that you use a lot in easy-to-reach places.  If you need to reach something above you, use a strong step stool that has a grab bar.  Keep electrical cords out of the way.  Do not use floor polish or wax that makes floors slippery. If you must use wax, use non-skid floor wax.  Do not have throw rugs and other things on the floor that can make you trip. WHAT CAN I DO WITH MY STAIRS?  Do not leave any items on the stairs.  Make sure that there are handrails on both  sides of the stairs and use them. Fix handrails that are broken or loose. Make sure that handrails are as long as the stairways.  Check any carpeting to make sure that it is firmly attached to the stairs. Fix any carpet that is loose or worn.  Avoid having throw rugs at the top or bottom of the stairs. If you do have throw rugs, attach them to the floor with carpet tape.  Make sure that you have a light switch at the top of the stairs and the bottom of the stairs. If you do not have them, ask someone to add them for you. WHAT ELSE CAN I DO TO HELP PREVENT FALLS?  Wear shoes that:  Do not have high heels.  Have rubber bottoms.  Are comfortable and fit you well.  Are closed at the toe. Do not wear sandals.  If you use a stepladder:  Make sure that it is fully opened. Do not climb a closed stepladder.  Make sure that both sides of the stepladder are locked into place.  Ask someone to hold it for you, if possible.  Clearly mark and make sure that you can see:  Any grab bars or handrails.  First and last steps.  Where the edge of each step is.  Use tools that help you move around (mobility aids) if they are needed. These include:  Canes.  Walkers.  Scooters.  Crutches.  Turn on the lights when you go into a dark area. Replace any light bulbs as soon as they burn out.  Set up your furniture so you have a clear path. Avoid moving your furniture around.  If any of your floors are uneven, fix them.  If there are any pets around you, be aware of where they are.  Review your medicines with your doctor. Some medicines can make you feel dizzy. This can increase your chance of falling. Ask your doctor what other things that you can do to help prevent falls.   This information is not intended to replace advice given to you by your health care provider. Make sure you discuss any questions you have with your health care provider.   Document Released: 09/18/2009 Document  Revised: 04/08/2015 Document Reviewed: 12/27/2014 Elsevier Interactive Patient Education 2016 [place] Maintenance, Male A healthy lifestyle and preventative care can promote health and wellness.  Maintain  regular health, dental, and eye exams.  Eat a healthy diet. Foods like vegetables, fruits, whole grains, low-fat dairy products, and lean protein foods contain the nutrients you need and are low in calories. Decrease your intake of foods high in solid fats, added sugars, and salt. Get information about a proper diet from your health care provider, if necessary.  Regular physical exercise is one of the most important things you can do for your health. Most adults should get at least 150 minutes of moderate-intensity exercise (any activity that increases your heart rate and causes you to sweat) each week. In addition, most adults need muscle-strengthening exercises on 2 or more days a week.   Maintain a healthy weight. The body mass index (BMI) is a screening tool to identify possible weight problems. It provides an estimate of body fat based on height and weight. Your health care provider can find your BMI and can help you achieve or maintain a healthy weight. For males 20 years and older:  A BMI below 18.5 is considered underweight.  A BMI of 18.5 to 24.9 is normal.  A BMI of 25 to 29.9 is considered overweight.  A BMI of 30 and above is considered obese.  Maintain normal blood lipids and cholesterol by exercising and minimizing your intake of saturated fat. Eat a balanced diet with plenty of fruits and vegetables. Blood tests for lipids and cholesterol should begin at age 66 and be repeated every 5 years. If your lipid or cholesterol levels are high, you are over age 75, or you are at high risk for heart disease, you may need your cholesterol levels checked more frequently.Ongoing high lipid and cholesterol levels should be treated with medicines if diet and exercise are not  working.  If you smoke, find out from your health care provider how to quit. If you do not use tobacco, do not start.  Lung cancer screening is recommended for adults aged 29-80 years who are at high risk for developing lung cancer because of a history of smoking. A yearly low-dose CT scan of the lungs is recommended for people who have at least a 30-pack-year history of smoking and are current smokers or have quit within the past 15 years. A pack year of smoking is smoking an average of 1 pack of cigarettes a day for 1 year (for example, a 30-pack-year history of smoking could mean smoking 1 pack a day for 30 years or 2 packs a day for 15 years). Yearly screening should continue until the smoker has stopped smoking for at least 15 years. Yearly screening should be stopped for people who develop a health problem that would prevent them from having lung cancer treatment.  If you choose to drink alcohol, do not have more than 2 drinks per day. One drink is considered to be 12 oz (360 mL) of beer, 5 oz (150 mL) of wine, or 1.5 oz (45 mL) of liquor.  Avoid the use of street drugs. Do not share needles with anyone. Ask for help if you need support or instructions about stopping the use of drugs.  High blood pressure causes heart disease and increases the risk of stroke. High blood pressure is more likely to develop in:  People who have blood pressure in the end of the normal range (100-139/85-89 mm Hg).  People who are overweight or obese.  People who are African American.  If you are 44-58 years of age, have your blood pressure checked every 3-5 years. If  you are 73 years of age or older, have your blood pressure checked every year. You should have your blood pressure measured twice--once when you are at a hospital or clinic, and once when you are not at a hospital or clinic. Record the average of the two measurements. To check your blood pressure when you are not at a hospital or clinic, you can  use:  An automated blood pressure machine at a pharmacy.  A home blood pressure monitor.  If you are 21-48 years old, ask your health care provider if you should take aspirin to prevent heart disease.  Diabetes screening involves taking a blood sample to check your fasting blood sugar level. This should be done once every 3 years after age 80 if you are at a normal weight and without risk factors for diabetes. Testing should be considered at a younger age or be carried out more frequently if you are overweight and have at least 1 risk factor for diabetes.  Colorectal cancer can be detected and often prevented. Most routine colorectal cancer screening begins at the age of 3 and continues through age 76. However, your health care provider may recommend screening at an earlier age if you have risk factors for colon cancer. On a yearly basis, your health care provider may provide home test kits to check for hidden blood in the stool. A small camera at the end of a tube may be used to directly examine the colon (sigmoidoscopy or colonoscopy) to detect the earliest forms of colorectal cancer. Talk to your health care provider about this at age 79 when routine screening begins. A direct exam of the colon should be repeated every 5-10 years through age 66, unless early forms of precancerous polyps or small growths are found.  People who are at an increased risk for hepatitis B should be screened for this virus. You are considered at high risk for hepatitis B if:  You were born in a country where hepatitis B occurs often. Talk with your health care provider about which countries are considered high risk.  Your parents were born in a high-risk country and you have not received a shot to protect against hepatitis B (hepatitis B vaccine).  You have HIV or AIDS.  You use needles to inject street drugs.  You live with, or have sex with, someone who has hepatitis B.  You are a man who has sex with other  men (MSM).  You get hemodialysis treatment.  You take certain medicines for conditions like cancer, organ transplantation, and autoimmune conditions.  Hepatitis C blood testing is recommended for all people born from [birth year] through 1967 and any individual with known risk factors for hepatitis C.  Healthy men should no longer receive prostate-specific antigen (PSA) blood tests as part of routine cancer screening. Talk to your health care provider about prostate cancer screening.  Testicular cancer screening is not recommended for adolescents or adult males who have no symptoms. Screening includes self-exam, a health care provider exam, and other screening tests. Consult with your health care provider about any symptoms you have or any concerns you have about testicular cancer.  Practice safe sex. Use condoms and avoid high-risk sexual practices to reduce the spread of sexually transmitted infections (STIs).  You should be screened for STIs, including gonorrhea and chlamydia if:  You are sexually active and are younger than 24 years.  You are older than 24 years, and your health care provider tells you that you are at  risk for this type of infection.  Your sexual activity has changed since you were last screened, and you are at an increased risk for chlamydia or gonorrhea. Ask your health care provider if you are at risk.  If you are at risk of being infected with HIV, it is recommended that you take a prescription medicine daily to prevent HIV infection. This is called pre-exposure prophylaxis (PrEP). You are considered at risk if:  You are a man who has sex with other men (MSM).  You are a heterosexual man who is sexually active with multiple partners.  You take drugs by injection.  You are sexually active with a partner who has HIV.  Talk with your health care provider about whether you are at high risk of being infected with HIV. If you choose to begin PrEP, you should first be tested  for HIV. You should then be tested every 3 months for as long as you are taking PrEP.  Use sunscreen. Apply sunscreen liberally and repeatedly throughout the day. You should seek shade when your shadow is shorter than you. Protect yourself by wearing long sleeves, pants, a wide-brimmed hat, and sunglasses year round whenever you are outdoors.  Tell your health care provider of new moles or changes in moles, especially if there is a change in shape or color. Also, tell your health care provider if a mole is larger than the size of a pencil eraser.  A one-time screening for abdominal aortic aneurysm (AAA) and surgical repair of large AAAs by ultrasound is recommended for men aged 60-75 years who are current or former smokers.  Stay current with your vaccines (immunizations).   This information is not intended to replace advice given to you by your health care provider. Make sure you discuss any questions you have with your health care provider.   Document Released: 05/20/2008 Document Revised: 12/13/2014 Document Reviewed: 04/19/2011 Elsevier Interactive Patient Education [employer].

## 2016-06-28 NOTE — Progress Notes (Addendum)
Subjective:   [name] is a 60 y.o. male who presents for Medicare Annual/Subsequent preventive examination.   HRA assessment completed during this visit with [name]   The Patient was informed that the wellness visit is to identify future health risk and educate and initiate measures that can reduce risk for increased disease through the lifespan.    NO ROS; Medicare Wellness Visit Last OV:  06/24/2016 Labs completed: 07/20; A1c 6.0 (chol 122; trig 89; HDL 49; LDL 55/ ratio 2)  No longer sees endocrinologist at one year after gastric bypass Disability started with fall 12012 and could no control Sugar; decided to have the bariatric surgery; Just had  and fx knee;  Then had bariatric surgery/ just had panniculectomy in March and is doing well; Continues to take Metformin 500 q d   Lifestyle review and risk:  HTN; medically managed  PE: on Warfarin Tobacco use / rare  Disability since Back surgery 2013; has therapy on occasion; Orthopedic at [facility]   Psychosocial: Recently married;  Lives in his home; may move in one year to spouse's home; The both have 2 children each. [name]. Karle children are in living in [place] He is Jehovah's Witness;   Tobacco: no smoking; stopped many years ago; low pack yr hx; 1.25   How many drinks do you have per week? Occasionally   Medications: compliant  BMI: 32 /   Diet Lost 160 / 170 lbs; around 2013;  After everything normalized, cut down on meals Am; oatmeal; glass of juice [place] sausage Some am; just has protein drink Afternoon; [place] with veg and fruit Dinner; Portion of protein; some carbs; greens  Teeth or Denture issues? Has dental insurance;  Has current dental care; Dr. Wyline Beady  Exercise;  walks; goes to the gym 3 times a week Spends one hour 3 times but sometimes every day 45 minutes;   [place]; one level;  Wife's home is one level as well  Bathroom very accessible  Fall hx; 2012;  Fall risk: Fear  of falling? no  Given education on "Fall Prevention in the Home" for more safety tips the patient can apply as appropriate.  Long term goal is to "age in place" his home or wife's home.   Safety features reviewed for safe community; will keep firearms in a safe place if they exist; smoke alarms; sun protection when outside; (yes) driving difficulties or accidents; no  Mental Health:  Any emotional problems? Anxious, depressed, irritable, sad or blue? No  Denies feeling depressed or hopeless; voices pleasure in daily life How many social activities have you been engaged in within the last 2 weeks? None  Pain: no; the worse if when his back flares up and right knee;  surgery on right  "Doesn't have a lot of cartilage in right knee"; has not seen md in awhile but may make apt with ortho if it starts inhibiting his exercise    Cognitive; memory issues; none reported or noted  Manages checkbook, medications; no failures of task Ad8 score reviewed for issues;  Issues making decisions; no  Less interest in hobbies / activities" no  Repeats questions, stories; family complaining: NO  Trouble using ordinary gadgets; microwave; computer: no  Forgets the month or year: no  Mismanaging finances: no  Missing apt: no but does write them down  Daily problems with thinking of memory NO Ad8 score is 0   Any dizziness when standing up? No   Mobilization and Functional  losses from last year to this year? Not really   Sleep pattern changes; sleeps good   Urinary or fecal incontinence reviewed: no  Advanced Directive addressed; no, would like information  Given 2 copies of [facility] advance directives and reviewed all the information. Can use [facility] pastoral dept if he has questions  Counseling Health Maintenance Gaps: HIV; will discuss with Dr. Jenny Reichmann Colonoscopy; 03/2010/  to repeat 03/2020 EKG: 03/2016 Prostate cancer screening: recently completed; neg  Hearing:  Right ear 4000  Left ear  4000   Ophthalmology exam: annually; Dr. Bing Plume;  Had eye apt  already  this year;  NO Diabetic retinopathy  Went to eye specialist; Dr. Marcell Anger  Tears on one of his eyes but resolved now  Immunizations Due: Agreed to take PSV 23 today  Individual Goal: To continue to lose weight; Would like to get down to 225;   Health Recommendations and Referrals Risk for CV disease reviewed; including BP good; Cholesterol good on statin; diet and exercise;  Diabetes Screening/ 6.0 Prostate Screening  1.11 Nutritional Counseling; continues to lose approx 1 pound a month  Barriers to Success None noted   Current Care Team reviewed and updated   Education provided and lifestyle risk discussed   All Health Maintenance Gaps Reviewed for closure    Cardiac Risk Factors include: advanced age (>68men, >98 women);diabetes mellitus;dyslipidemia;family history of premature cardiovascular disease;hypertension;male gender     Objective:    Vitals: BP 118/70   Pulse 64   Temp 98.4 F (36.9 C)   Ht 6\' 2"  (1.88 m)   Wt 250 lb (113.4 kg)   SpO2 97%   BMI 32.10 kg/m   Body mass index is 32.1 kg/m.  Tobacco History  Smoking Status  . Former Smoker  . Packs/day: 0.25  . Years: 5.00  . Quit date: 12/06/1978  Smokeless Tobacco  . Never Used     Counseling given: Yes   Past Medical History:  Diagnosis Date  . BACK PAIN   . COLONIC POLYPS, HX OF   . DEGENERATIVE JOINT DISEASE, KNEES, BILATERAL   . Depression   . DEPRESSION   . Diabetes mellitus    type II  . DIVERTICULOSIS, COLON   . DVT, lower extremity ([place])    right  . Embolus ([facility]) 2005   pulmonary  . Hyperlipidemia   . Hypertension   . HYPOGONADISM, MALE   . HYPOTENSION, ORTHOSTATIC   . Kidney stones   . Long term current use of anticoagulant   . Obesity   . OBESITY, MORBID   . Obstructive sleep apnea    uses cpap setting 8 to 10  . Renal insufficiency   . RENAL INSUFFICIENCY    Past Surgical History:  Procedure  Laterality Date  . ankle surgury  2001 bilateral   with bone spurs and torn tendon  . EDG  2008   chronic Duodenitis  . GASTRIC BYPASS    . HEEL SPUR SURGERY Bilateral 2005  . knee surgury  2000 & 2003    cartilage damage  . LUMBAR LAMINECTOMY/DECOMPRESSION MICRODISCECTOMY  09/28/2012   Procedure: LUMBAR LAMINECTOMY/DECOMPRESSION MICRODISCECTOMY 2 LEVELS;  Surgeon: [name], MD;  Location: [facility];  Service: Orthopedics;  Laterality: Left;  Decompressive laminectomy L4-L5. Microdiscectomy L5-S1  . REPLACEMENT TOTAL KNEE  2013  . TONSILLECTOMY AND ADENOIDECTOMY  age 10 or 89  . TOTAL KNEE ARTHROPLASTY  01/10/2012   Procedure: TOTAL KNEE ARTHROPLASTY;  Surgeon: [name], MD;  Location: [facility];  Service: Orthopedics;  Laterality: Left;   Family History  Problem Relation Age of Onset  . Hypertension Mother   . Prostate cancer Father   . Diabetes Sister     died with DM 2001-03-12, 2 more sisters with diabetes  . Heart failure Sister   . Diabetes Brother   . Diabetes Sister   . Diabetes Sister    History  Sexual Activity  . Sexual activity: Not Currently    Outpatient Encounter Prescriptions as of 06/28/2016  Medication Sig  . Artificial Tear Ointment (ARTIFICIAL TEARS) ointment Place 1 drop into both eyes as needed (for dry eyes).  Marland Kitchen aspirin EC 81 MG tablet Take 81 mg by mouth daily.  Marland Kitchen atorvastatin (LIPITOR) 80 MG tablet TAKE 1 TABLET (80 MG TOTAL) BY MOUTH DAILY. (Patient taking differently: TAKE 1 TABLET (80 MG TOTAL) BY MOUTH every evening)  . Calcium Carbonate (CALCI-CHEW PO) Take 1 tablet by mouth daily.   . Cyanocobalamin (VITAMIN B-12 SL) Place 1 tablet under the tongue daily.  Marland Kitchen escitalopram (LEXAPRO) 10 MG tablet Take 1 tablet (10 mg total) by mouth daily.  . Ferrous Sulfate (IRON) 90 (18 Fe) MG TABS Take 18 mg by mouth daily.  . fluticasone (FLONASE) 50 MCG/ACT nasal spray Place 2 sprays into both nostrils daily.  Marland Kitchen lisinopril (PRINIVIL,ZESTRIL) 5 MG tablet TAKE 1  TABLET (5 MG TOTAL) BY MOUTH DAILY.  . metFORMIN (GLUCOPHAGE) 500 MG tablet Take 1 tablet (500 mg total) by mouth daily with breakfast.  . Multiple Vitamin (MULTIVITAMIN WITH MINERALS) TABS tablet Take 1 tablet by mouth daily.  . nitroGLYCERIN (NITROSTAT) 0.4 MG SL tablet Place 1 tablet (0.4 mg total) under the tongue every 5 (five) minutes as needed for chest pain.  . polyethylene glycol (MIRALAX / GLYCOLAX) packet Take 17 g by mouth daily as needed for mild constipation.  . pregabalin (LYRICA) 50 MG capsule Take 1 capsule (50 mg total) by mouth 2 (two) times daily. (Patient taking differently: Take 50 mg by mouth daily after supper. )  . senna-docusate (SENNA PLUS) 8.6-50 MG tablet Take 1 tablet by mouth daily as needed for mild constipation. Reported on 03/22/2016  . warfarin (COUMADIN) 5 MG tablet Take 1 tablet (5 mg total) by mouth as directed. (Patient taking differently: Take 10-15 mg by mouth every morning. 10 mg 13-Mar-2023 and Friday. 15 mg all other days)   No facility-administered encounter medications on file as of 06/28/2016.     Activities of Daily Living In your present state of health, do you have any difficulty performing the following activities: 06/28/2016  Hearing? N  Vision? N  Difficulty concentrating or making decisions? N  Walking or climbing stairs? N  Dressing or bathing? N  Doing errands, shopping? N  Preparing Food and eating ? N  Using the Toilet? N  In the past six months, have you accidently leaked urine? N  Do you have problems with loss of bowel control? N  Managing your Medications? N  Managing your Finances? N  Housekeeping or managing your Housekeeping? N  Some recent data might be hidden    Patient Care Team: Biagio Borg, MD as PCP - General Suella Broad, MD as Consulting Physician (Physical Medicine and Rehabilitation) Eldridge Abrahams, MD as Referring Physician (Surgical Oncology) Renato Shin, MD as Consulting Physician (Endocrinology)     Assessment:     Exercise Activities and Dietary recommendations Current Exercise Habits: Structured exercise class, Type of exercise: strength training/weights;walking, Time (Minutes): 60, Frequency (Times/Week): 4,  Weekly Exercise (Minutes/Week): 240, Intensity: Moderate  Goals    . Exercise 3x per week (30 min per time)          Hopes to stay active; will focus on keeping back strong; to fup with PT regarding exercises recommended to keep the back strong. Golf for fun     . Weight (lb) < 225 lb (102.1 kg)          Continue with healthy regiment; still seeing weight loss on current regiment; Every 2 months approx 2 lbs;       Fall Risk Fall Risk  06/28/2016 12/22/2015 04/22/2015 03/13/2014  Falls in the past year? No No No No   Depression Screen PHQ 2/9 Scores 06/28/2016 12/22/2015 04/22/2015 03/13/2014  PHQ - 2 Score 0 0 0 0   Stated anti-depressant after his fall when he was at home and could not be mobile;   Cognitive Testing MMSE - Mini Mental State Exam 06/28/2016  Not completed: (No Data)   Ad8 score -0   Immunization History  Administered Date(s) Administered  . Influenza Split 08/19/2011, 10/30/2012  . Influenza Whole 09/21/2005, 09/02/2008, 12/24/2009  . Influenza,inj,Quad PF,36+ Mos 09/13/2013, 09/30/2014, 10/29/2015  . Pneumococcal Conjugate-13 03/13/2014  . Pneumococcal Polysaccharide-23 03/04/2008, 06/28/2016  . Td 03/11/2009  . Zoster 10/29/2015   Screening Tests Health Maintenance  Topic Date Due  . HIV Screening  12/18/1970  . PNEUMOCOCCAL POLYSACCHARIDE VACCINE (2) 03/04/2013  . INFLUENZA VACCINE  07/06/2016  . FOOT EXAM  07/24/2016  . OPHTHALMOLOGY EXAM  11/03/2016  . HEMOGLOBIN A1C  12/25/2016  . TETANUS/TDAP  03/12/2019  . COLONOSCOPY  03/18/2020  . ZOSTAVAX  Completed  . Hepatitis C Screening  Completed      Plan:     Took PSV 23 today Continues to watch diet and lose weight; s/p gastric bypass Very happy with his decision; A1c 6; exercises  at least an hour 60 minute 3 days a week but generally does something everyday for 45 min  During the course of the visit the patient was educated and counseled about the following appropriate screening and preventive services:   Vaccines to include Pneumoccal, Influenza, Hepatitis B, Td, Zostavax, HCV  Electrocardiogram/ defer  Cardiovascular Disease/ BP good; labs good  Colorectal cancer screening; due 03/2020  Diabetes screening/ well controlled s/p gastric bypass  Prostate Cancer Screening / checked annually   Glaucoma screening/ neg with recent eye check  Nutrition counseling; watches diet;   Smoking cessation counseling/ rare smoking; states he quit a very long time ago;  Patient Instructions (the written plan) was given to the patient.    Wynetta Fines, RN  06/28/2016   Medical screening examination/treatment/procedure(s) were performed by non-physician practitioner and as supervising physician I was immediately available for consultation/collaboration. I agree with above. Binnie Rail, MD

## 2016-06-30 ENCOUNTER — Other Ambulatory Visit: Payer: Self-pay | Admitting: Internal Medicine

## 2016-07-02 ENCOUNTER — Ambulatory Visit: Payer: PPO

## 2016-07-07 ENCOUNTER — Ambulatory Visit: Payer: Self-pay

## 2016-07-07 ENCOUNTER — Ambulatory Visit (INDEPENDENT_AMBULATORY_CARE_PROVIDER_SITE_OTHER): Payer: PPO | Admitting: General Practice

## 2016-07-07 DIAGNOSIS — Z5181 Encounter for therapeutic drug level monitoring: Secondary | ICD-10-CM

## 2016-07-07 LAB — POCT INR: INR: 2.1

## 2016-07-07 NOTE — Progress Notes (Signed)
I have reviewed and agree with the plan. 

## 2016-07-13 DIAGNOSIS — Z9889 Other specified postprocedural states: Secondary | ICD-10-CM | POA: Diagnosis not present

## 2016-07-14 NOTE — Progress Notes (Signed)
[name] [address] [place], [place] [address] Phone: [phone] Subjective:    I'm seeing this patient by the request  of:    CC: right shoulder and knee pain  [id number]  [name] is a 60 y.o. male coming in with complaint of right shoulder pain. Mild discomfort. Occurred over the course of several weeks. Does not rib number any injury. Throughout the day he seems to do relatively well. Patient though unfortunately has worsening pain at night. Wakes him up on this every single night. Patient denies any radiation of pain. Seems to stay within the shoulder itself. Denies any neck pain. Rates the severity of pain as 5 out of 10. Has tried ibuprofen and Tylenol with mild improvement.  Right knee pain- been going on for multiple months. Does not remember any true injury. Past medical history significant for a left total knee replacement. Feels very similar to what it did prior to his previous surgery. Patient has had an arthroscopic procedure on this knee. States that he is having some increasing instability. Difficult he with walking on a regular basis. Patient is been less active recently.     Past Medical History:  Diagnosis Date  . BACK PAIN   . COLONIC POLYPS, HX OF   . DEGENERATIVE JOINT DISEASE, KNEES, BILATERAL   . Depression   . DEPRESSION   . Diabetes mellitus    type II  . DIVERTICULOSIS, COLON   . DVT, lower extremity ([place])    right  . Embolus ([facility]) 2005   pulmonary  . Hyperlipidemia   . Hypertension   . HYPOGONADISM, MALE   . HYPOTENSION, ORTHOSTATIC   . Kidney stones   . Long term current use of anticoagulant   . Obesity   . OBESITY, MORBID   . Obstructive sleep apnea    uses cpap setting 8 to 10  . Renal insufficiency   . RENAL INSUFFICIENCY    Past Surgical History:  Procedure Laterality Date  . ankle surgury  2001 bilateral   with bone spurs and torn tendon  . EDG  2008   chronic Duodenitis  . GASTRIC  BYPASS    . HEEL SPUR SURGERY Bilateral 2005  . knee surgury  2000 & 2003    cartilage damage  . LUMBAR LAMINECTOMY/DECOMPRESSION MICRODISCECTOMY  09/28/2012   Procedure: LUMBAR LAMINECTOMY/DECOMPRESSION MICRODISCECTOMY 2 LEVELS;  Surgeon: [name], MD;  Location: [facility];  Service: Orthopedics;  Laterality: Left;  Decompressive laminectomy L4-L5. Microdiscectomy L5-S1  . REPLACEMENT TOTAL KNEE  2013  . TONSILLECTOMY AND ADENOIDECTOMY  age 68 or 79  . TOTAL KNEE ARTHROPLASTY  01/10/2012   Procedure: TOTAL KNEE ARTHROPLASTY;  Surgeon: [name], MD;  Location: [facility];  Service: Orthopedics;  Laterality: Left;   Social History   Social History  . Marital status: Single    Spouse name: N/A  . Number of children: 2  . Years of education: N/A   Occupational History  . back at work at [employer] since Jan. 2010 [place]  . Smoking status: Former Smoker    Packs/day: 0.25    Years: 5.00    Quit date: 12/06/1978  . Smokeless tobacco: Never Used  . Alcohol use Not on file     Comment: occasional  . Drug use: No  . Sexual activity: Not Currently   Other Topics Concern  . Not on file   Social History Narrative  Daily caffeine use one per day   Protein drinks    Live alone   Children; a friend and sister that helps   Brother that is around Legrand [place])   HAVE A SON AND DTR IN [place]   Allergies  Allergen Reactions  . Adhesive [Tape] Other (See Comments)    Painful, Please use "paper" tape  . Other     NO BLOOD -JEHOVAH'S WITNESS-SIGNED REFUSAL BUT WOULD TAKE ALBUMIN   Family History  Problem Relation Age of Onset  . Hypertension Mother   . Prostate cancer Father   . Diabetes Sister     died with DM 03-29-2001, 2 more sisters with diabetes  . Heart failure Sister   . Diabetes Brother   . Diabetes Sister   . Diabetes Sister     Past medical history, social, surgical and family history all reviewed in electronic medical  record.  No pertanent information unless stated regarding to the chief complaint.   Review of Systems: No headache, visual changes, nausea, vomiting, diarrhea, constipation, dizziness, abdominal pain, skin rash, fevers, chills, night sweats, weight loss, swollen lymph nodes, body aches, joint swelling, muscle aches, chest pain, shortness of breath, mood changes.   Objective  There were no vitals taken for this visit.  General: No apparent distress alert and oriented x3 mood and affect normal, dressed appropriately.  HEENT: Pupils equal, extraocular movements intact  Respiratory: Patient's speak in full sentences and does not appear short of breath  Cardiovascular: No lower extremity edema, non tender, no erythema  Skin: Warm dry intact with no signs of infection or rash on extremities or on axial skeleton.  Abdomen: Soft nontender  Neuro: Cranial nerves II through XII are intact, neurovascularly intact in all extremities with 2+ DTRs and 2+ pulses.  Lymph: No lymphadenopathy of posterior or anterior cervical chain or axillae bilaterally.  Gait normal with good balance and coordination.  MSK:  Non tender with full range of motion and good stability and symmetric strength and tone of , elbows, wrist, hip, and ankles bilaterally. Mild arthritic changes noted Knee:right Arthritic changes noted. Mild thigh to calf ratio disparity Tender to palpation of the medial joint line ROM full in flexion and extension and lower leg rotation. Instability noted with valgus force Negative Mcmurray's, Apley's, and Thessalonian tests. Non painful patellar compression. Patellar glide without crepitus. Patellar and quadriceps tendons unremarkable. Hamstring and quadriceps strength is normal.  Contralateral knee has hurt placement.  Shoulder: Right Inspection reveals no abnormalities, atrophy or asymmetry. Palpation is normal with no tenderness over AC joint or bicipital groove. ROM is full in all planes  passively. Rotator cuff strength normal throughout. signs of impingement with positive Neer and Hawkin's tests, but negative empty can sign. Speeds and Yergason's tests normal. No labral pathology noted with negative Obrien's, negative clunk and good stability. Normal scapular function observed. No painful arc and no drop arm sign. No apprehension sign  MSK US performed of: Right This study was ordered, performed, and interpreted by Charlann Boxer D.O.  Shoulder:   Supraspinatus:  Appears normal on long and transverse views, Bursal bulge seen with shoulder abduction on impingement view. Infraspinatus:  Appears normal on long and transverse views. Significant increase in Doppler flow Subscapularis:  Appears normal on long and transverse views. Positive bursa Teres Minor:  Appears normal on long and transverse views. AC joint:  Capsule undistended, no geyser sign. Glenohumeral Joint:  Appears normal without effusion. Glenoid Labrum:  Intact without visualized tears. Biceps Tendon:  Appears normal on long and transverse views, no fraying of tendon, tendon located in intertubercular groove, no subluxation with shoulder internal or external rotation.  Impression: Subacromial bursitis    After informed written and verbal consent, patient was seated on exam table. Right knee was prepped with alcohol swab and utilizing anterolateral approach, patient's right knee space was injected with 4:1  marcaine 0.5%: Kenalog 40mg /dL. Patient tolerated the procedure well without immediate complications. Impression and Recommendations:     This case required medical decision making of moderate complexity.      Note: This dictation was prepared with Dragon dictation along with smaller phrase technology. Any transcriptional errors that result from this process are unintentional.

## 2016-07-15 ENCOUNTER — Other Ambulatory Visit: Payer: Self-pay

## 2016-07-15 ENCOUNTER — Ambulatory Visit (INDEPENDENT_AMBULATORY_CARE_PROVIDER_SITE_OTHER): Payer: PPO | Admitting: Family Medicine

## 2016-07-15 ENCOUNTER — Encounter: Payer: Self-pay | Admitting: Family Medicine

## 2016-07-15 VITALS — BP 134/80 | HR 59 | Ht 74.0 in | Wt 248.0 lb

## 2016-07-15 DIAGNOSIS — M7550 Bursitis of unspecified shoulder: Secondary | ICD-10-CM | POA: Insufficient documentation

## 2016-07-15 DIAGNOSIS — M25511 Pain in right shoulder: Secondary | ICD-10-CM

## 2016-07-15 DIAGNOSIS — M7551 Bursitis of right shoulder: Secondary | ICD-10-CM

## 2016-07-15 DIAGNOSIS — M1711 Unilateral primary osteoarthritis, right knee: Secondary | ICD-10-CM | POA: Diagnosis not present

## 2016-07-15 HISTORY — DX: Unilateral primary osteoarthritis, right knee: M17.11

## 2016-07-15 NOTE — Patient Instructions (Signed)
Good to see you.  Ice 20 minutes 2 times daily. Usually after activity and before bed. Exercises 3 times a week. Alternate shoulder and knee.  Tylenol 500mg  3 times daily  Turmeric 500mg  daily  Tart cherry extract any dose at night pennsaid pinkie amount topically 2 times daily as needed.  Good shoes with rigid bottom.  [name], [place] or New balance greater then 700 Spenco orthotics "total support" online would be great  Avoid any activity with your hands outside peripheral vision See me again in 6 weeks to make sure you are doing well.

## 2016-07-15 NOTE — Assessment & Plan Note (Signed)
Patient given injection today and tolerated the procedure well. We discussed icing regimen. Given trial of topical anti-inflammatory's. We discussed over-the-counter medications. Patient will try new shoes and over-the-counter orthotics. Patient will come back and see me again in 6 week period patient given home exercises and progressive be beneficial. We can repeat steroid injections every 3 months. If worsening pain he could be a candidate for viscous supplementation.

## 2016-07-15 NOTE — Assessment & Plan Note (Signed)
Mild overall. Patient elected try conservative therapy. We'll try icing, home exercises and will monitor pain. Worsening pain he may be a candidate for injection. May also be a candidate for physical therapy. Follow-up in 6 weeks.

## 2016-07-25 ENCOUNTER — Other Ambulatory Visit: Payer: Self-pay | Admitting: Internal Medicine

## 2016-08-11 ENCOUNTER — Ambulatory Visit: Payer: PPO

## 2016-08-24 NOTE — Progress Notes (Deleted)
[name] [place], [place] [address] Phone: [phone] Subjective:     CC: right shoulder and knee pain f/u  [id number]  [name] is a 60 y.o. male coming in with complaint of right shoulder pain. Mild discomfort. Occurred over the course of several weeks. Does not rib number any injury. Throughout the day he seems to do relatively well. Patient though unfortunately has worsening pain at night. Wakes him up on this every single night. Patient denies any radiation of pain. Seems to stay within the shoulder itself. Denies any neck pain. Rates the severity of pain as 5 out of 10. Has tried ibuprofen and Tylenol with mild improvement.  Right knee pain- Have Arthritic Changes and Was Given an Injection 07/15/2016. Patient Was to Do Conservative Therapy. Patient States     Past Medical History:  Diagnosis Date  . BACK PAIN   . COLONIC POLYPS, HX OF   . DEGENERATIVE JOINT DISEASE, KNEES, BILATERAL   . Depression   . DEPRESSION   . Diabetes mellitus    type II  . DIVERTICULOSIS, COLON   . DVT, lower extremity ([place])    right  . Embolus ([place]) 2005   pulmonary  . Hyperlipidemia   . Hypertension   . HYPOGONADISM, MALE   . HYPOTENSION, ORTHOSTATIC   . Kidney stones   . Long term current use of anticoagulant   . Obesity   . OBESITY, MORBID   . Obstructive sleep apnea    uses cpap setting 8 to 10  . Renal insufficiency   . RENAL INSUFFICIENCY    Past Surgical History:  Procedure Laterality Date  . ankle surgury  2001 bilateral   with bone spurs and torn tendon  . EDG  2008   chronic Duodenitis  . GASTRIC BYPASS    . HEEL SPUR SURGERY Bilateral 2005  . knee surgury  2000 & 2003    cartilage damage  . LUMBAR LAMINECTOMY/DECOMPRESSION MICRODISCECTOMY  09/28/2012   Procedure: LUMBAR LAMINECTOMY/DECOMPRESSION MICRODISCECTOMY 2 LEVELS;  Surgeon: [name] Sinning, MD;  Location: [facility];  Service: Orthopedics;  Laterality: Left;   Decompressive laminectomy L4-L5. Microdiscectomy L5-S1  . REPLACEMENT TOTAL KNEE  2013  . TONSILLECTOMY AND ADENOIDECTOMY  age 30 or 55  . TOTAL KNEE ARTHROPLASTY  01/10/2012   Procedure: TOTAL KNEE ARTHROPLASTY;  Surgeon: [name] Pole, MD;  Location: [facility];  Service: Orthopedics;  Laterality: Left;   Social History   Social History  . Marital status: Single    Spouse name: N/A  . Number of children: 2  . Years of education: N/A   Occupational History  . back at work at [employer] since Jan. 2010 [place]  . Smoking status: Former Smoker    Packs/day: 0.25    Years: 5.00    Quit date: 12/06/1978  . Smokeless tobacco: Never Used  . Alcohol use Not on file     Comment: occasional  . Drug use: No  . Sexual activity: Not Currently   Other Topics Concern  . Not on file   Social History Narrative   Daily caffeine use one per day   Protein drinks    Live alone   Children; a friend and sister that helps   Brother that is around [name])   HAVE A SON AND DTR IN [place]   Allergies  Allergen Reactions  . Adhesive [Tape] Other (See Comments)  Painful, Please use "paper" tape  . Other     NO BLOOD -JEHOVAH'S WITNESS-SIGNED REFUSAL BUT WOULD TAKE ALBUMIN   Family History  Problem Relation Age of Onset  . Hypertension Mother   . Prostate cancer Father   . Diabetes Sister     died with DM 21-Mar-2001, 2 more sisters with diabetes  . Heart failure Sister   . Diabetes Brother   . Diabetes Sister   . Diabetes Sister     Past medical history, social, surgical and family history all reviewed in electronic medical record.  No pertanent information unless stated regarding to the chief complaint.   Review of Systems: No headache, visual changes, nausea, vomiting, diarrhea, constipation, dizziness, abdominal pain, skin rash, fevers, chills, night sweats, weight loss, swollen lymph nodes, body aches, joint swelling, muscle aches, chest pain,  shortness of breath, mood changes.   Objective  There were no vitals taken for this visit.  General: No apparent distress alert and oriented x3 mood and affect normal, dressed appropriately.  HEENT: Pupils equal, extraocular movements intact  Respiratory: Patient's speak in full sentences and does not appear short of breath  Cardiovascular: No lower extremity edema, non tender, no erythema  Skin: Warm dry intact with no signs of infection or rash on extremities or on axial skeleton.  Abdomen: Soft nontender  Neuro: Cranial nerves II through XII are intact, neurovascularly intact in all extremities with 2+ DTRs and 2+ pulses.  Lymph: No lymphadenopathy of posterior or anterior cervical chain or axillae bilaterally.  Gait normal with good balance and coordination.  MSK:  Non tender with full range of motion and good stability and symmetric strength and tone of , elbows, wrist, hip, and ankles bilaterally. Mild arthritic changes noted Knee:right Arthritic changes noted. Mild thigh to calf ratio disparity Tender to palpation of the medial joint line ROM full in flexion and extension and lower leg rotation. Instability noted with valgus force Negative Mcmurray's, Apley's, and Thessalonian tests. Non painful patellar compression. Patellar glide without crepitus. Patellar and quadriceps tendons unremarkable. Hamstring and quadriceps strength is normal.  Contralateral knee has hurt placement.  Shoulder: Right Inspection reveals no abnormalities, atrophy or asymmetry. Palpation is normal with no tenderness over AC joint or bicipital groove. ROM is full in all planes passively. Rotator cuff strength normal throughout. signs of impingement with positive Neer and Hawkin's tests, but negative empty can sign. Speeds and Yergason's tests normal. No labral pathology noted with negative Obrien's, negative clunk and good stability. Normal scapular function observed. No painful arc and no drop arm  sign. No apprehension sign  MSK US performed of: Right This study was ordered, performed, and interpreted by [name] D.O.  Shoulder:   Supraspinatus:  Appears normal on long and transverse views, Bursal bulge seen with shoulder abduction on impingement view. Infraspinatus:  Appears normal on long and transverse views. Significant increase in Doppler flow Subscapularis:  Appears normal on long and transverse views. Positive bursa Teres Minor:  Appears normal on long and transverse views. AC joint:  Capsule undistended, no geyser sign. Glenohumeral Joint:  Appears normal without effusion. Glenoid Labrum:  Intact without visualized tears. Biceps Tendon:  Appears normal on long and transverse views, no fraying of tendon, tendon located in intertubercular groove, no subluxation with shoulder internal or external rotation.  Impression: Subacromial bursitis    After informed written and verbal consent, patient was seated on exam table. Right knee was prepped with alcohol swab and utilizing anterolateral approach, patient's right  knee space was injected with 4:1  marcaine 0.5%: Kenalog 40mg /dL. Patient tolerated the procedure well without immediate complications. Impression and Recommendations:     This case required medical decision making of moderate complexity.      Note: This dictation was prepared with Dragon dictation along with smaller phrase technology. Any transcriptional errors that result from this process are unintentional.

## 2016-08-25 ENCOUNTER — Encounter: Payer: Self-pay | Admitting: Family Medicine

## 2016-08-25 ENCOUNTER — Ambulatory Visit (INDEPENDENT_AMBULATORY_CARE_PROVIDER_SITE_OTHER): Payer: PPO | Admitting: Family Medicine

## 2016-08-25 ENCOUNTER — Ambulatory Visit: Payer: PPO | Admitting: Family Medicine

## 2016-08-25 DIAGNOSIS — M7551 Bursitis of right shoulder: Secondary | ICD-10-CM

## 2016-08-25 DIAGNOSIS — M1711 Unilateral primary osteoarthritis, right knee: Secondary | ICD-10-CM

## 2016-08-25 NOTE — Progress Notes (Signed)
[name] [place], [place] [address] Phone: [phone] Subjective:     CC: right shoulder and knee pain f/u  [id number]  [name] is a 60 y.o. male coming in with complaint of right shoulder pain.  Had subacromial bursitis. Continued with conservative therapy. States that he is doing better. States that there is not as much pain. Some mild aching but nothing severe.  Right knee pain- Have Arthritic Changes and Was Given an Injection 07/15/2016. Patient Was to Do Conservative Therapy. Patient States injection with significant helpful. 70% better. Still has some mild instability.     Past Medical History:  Diagnosis Date  . BACK PAIN   . COLONIC POLYPS, HX OF   . DEGENERATIVE JOINT DISEASE, KNEES, BILATERAL   . Depression   . DEPRESSION   . Diabetes mellitus    type II  . DIVERTICULOSIS, COLON   . DVT, lower extremity ([place])    right  . Embolus ([place]) 2005   pulmonary  . Hyperlipidemia   . Hypertension   . HYPOGONADISM, MALE   . HYPOTENSION, ORTHOSTATIC   . Kidney stones   . Long term current use of anticoagulant   . Obesity   . OBESITY, MORBID   . Obstructive sleep apnea    uses cpap setting 8 to 10  . Renal insufficiency   . RENAL INSUFFICIENCY    Past Surgical History:  Procedure Laterality Date  . ankle surgury  2001 bilateral   with bone spurs and torn tendon  . EDG  2008   chronic Duodenitis  . GASTRIC BYPASS    . HEEL SPUR SURGERY Bilateral 2005  . knee surgury  2000 & 2003    cartilage damage  . LUMBAR LAMINECTOMY/DECOMPRESSION MICRODISCECTOMY  09/28/2012   Procedure: LUMBAR LAMINECTOMY/DECOMPRESSION MICRODISCECTOMY 2 LEVELS;  Surgeon: [name], MD;  Location: [facility];  Service: Orthopedics;  Laterality: Left;  Decompressive laminectomy L4-L5. Microdiscectomy L5-S1  . REPLACEMENT TOTAL KNEE  2013  . TONSILLECTOMY AND ADENOIDECTOMY  age 31 or 71  . TOTAL KNEE ARTHROPLASTY  01/10/2012   Procedure:  TOTAL KNEE ARTHROPLASTY;  Surgeon: [name], MD;  Location: [facility];  Service: Orthopedics;  Laterality: Left;   Social History   Social History  . Marital status: Single    Spouse name: N/A  . Number of children: 2  . Years of education: N/A   Occupational History  . back at work at [employer] since Jan. 2010 [place]  . Smoking status: Former Smoker    Packs/day: 0.25    Years: 5.00    Quit date: 12/06/1978  . Smokeless tobacco: Never Used  . Alcohol use None     Comment: occasional  . Drug use: No  . Sexual activity: Not Currently   Other Topics Concern  . None   Social History Narrative   Daily caffeine use one per day   Protein drinks    Live alone   Children; a friend and sister that helps   Brother that is around Legrand Como)   HAVE A SON AND DTR IN [place]   Allergies  Allergen Reactions  . Adhesive [Tape] Other (See Comments)    Painful, Please use "paper" tape  . Other     NO BLOOD -JEHOVAH'S WITNESS-SIGNED REFUSAL BUT WOULD TAKE ALBUMIN   Family History  Problem Relation Age of Onset  . Hypertension Mother   . Prostate cancer  Father   . Diabetes Sister     died with DM 03/09/01, 2 more sisters with diabetes  . Heart failure Sister   . Diabetes Brother   . Diabetes Sister   . Diabetes Sister     Past medical history, social, surgical and family history all reviewed in electronic medical record.  No pertanent information unless stated regarding to the chief complaint.   Review of Systems: No headache, visual changes, nausea, vomiting, diarrhea, constipation, dizziness, abdominal pain, skin rash, fevers, chills, night sweats, weight loss, swollen lymph nodes, body aches, joint swelling, muscle aches, chest pain, shortness of breath, mood changes.   Objective  Blood pressure 130/80, pulse (!) 52, weight 247 lb (112 kg), SpO2 97 %.  General: No apparent distress alert and oriented x3 mood and affect normal,  dressed appropriately.  HEENT: Pupils equal, extraocular movements intact  Respiratory: Patient's speak in full sentences and does not appear short of breath  Cardiovascular: No lower extremity edema, non tender, no erythema  Skin: Warm dry intact with no signs of infection or rash on extremities or on axial skeleton.  Abdomen: Soft nontender  Neuro: Cranial nerves II through XII are intact, neurovascularly intact in all extremities with 2+ DTRs and 2+ pulses.  Lymph: No lymphadenopathy of posterior or anterior cervical chain or axillae bilaterally.  Gait normal with good balance and coordination.  MSK:  Non tender with full range of motion and good stability and symmetric strength and tone of , elbows, wrist, hip, and ankles bilaterally. Mild arthritic changes noted Knee:right Arthritic changes noted. Mild thigh to calf ratio disparity nontender on exam ROM full in flexion and extension and lower leg rotation. Instability noted with valgus force still noted. Negative Mcmurray's, Apley's, and Thessalonian tests. Non painful patellar compression. Patellar glide without crepitus. Patellar and quadriceps tendons unremarkable. Hamstring and quadriceps strength is normal.  Contralateral knee has hurt placement.  Shoulder: Right Inspection reveals no abnormalities, atrophy or asymmetry. Palpation is normal with no tenderness over AC joint or bicipital groove. ROM is full in all planes passively. Rotator cuff strength normal throughout. Very mild impediment sign still remaining Speeds and Yergason's tests normal. No labral pathology noted with negative Obrien's, negative clunk and good stability. Normal scapular function observed. No painful arc and no drop arm sign. No apprehension sign      Impression and Recommendations:     This case required medical decision making of moderate complexity.      Note: This dictation was prepared with Dragon dictation along with smaller phrase  technology. Any transcriptional errors that result from this process are unintentional.

## 2016-08-25 NOTE — Patient Instructions (Signed)
You are doing great  Ice is your friend.  Stay active.  pennsaid pinkie amount topically 2 times daily as needed. If you need more call me.  See me again in 6-8 weeks and if knee is worse or shoulder is worse we can inject it.  If doing fine though see me when you need me.

## 2016-08-25 NOTE — Assessment & Plan Note (Signed)
Seems stable at the moment. If worsening symptoms he can have an injection. Continue home exercises and icing.

## 2016-08-25 NOTE — Assessment & Plan Note (Signed)
Much better after injection. Discuss we can repeat every 3-4 months if needed. We discussed continuing conservative therapy. Declined custom bracing. Patient come back and see me again in 6 weeks for further evaluation.

## 2016-08-26 ENCOUNTER — Ambulatory Visit: Payer: PPO | Admitting: Family Medicine

## 2016-08-27 ENCOUNTER — Ambulatory Visit (INDEPENDENT_AMBULATORY_CARE_PROVIDER_SITE_OTHER): Payer: PPO | Admitting: General Practice

## 2016-08-27 DIAGNOSIS — Z5181 Encounter for therapeutic drug level monitoring: Secondary | ICD-10-CM

## 2016-08-27 LAB — POCT INR: INR: 1.7

## 2016-08-27 NOTE — Progress Notes (Signed)
I have reviewed and agree with the plan. 

## 2016-08-28 ENCOUNTER — Other Ambulatory Visit: Payer: Self-pay | Admitting: Internal Medicine

## 2016-08-31 NOTE — Telephone Encounter (Signed)
faxed

## 2016-08-31 NOTE — Telephone Encounter (Signed)
Done hardcopy to Corinne  

## 2016-09-10 ENCOUNTER — Other Ambulatory Visit: Payer: Self-pay | Admitting: Internal Medicine

## 2016-09-10 DIAGNOSIS — Z713 Dietary counseling and surveillance: Secondary | ICD-10-CM | POA: Diagnosis not present

## 2016-09-10 DIAGNOSIS — Z9884 Bariatric surgery status: Secondary | ICD-10-CM | POA: Diagnosis not present

## 2016-09-10 DIAGNOSIS — Z6832 Body mass index (BMI) 32.0-32.9, adult: Secondary | ICD-10-CM | POA: Diagnosis not present

## 2016-09-10 NOTE — Telephone Encounter (Signed)
States warfarin script has been changed and does not know why.  States patient usually take 5mg  of warfarin twice a day.  Usually script is for 90 days.  Please follow up with on clarification.  Call patient at [phone].

## 2016-09-10 NOTE — Telephone Encounter (Signed)
LVM for pt to call back as soon as possible.   

## 2016-09-10 NOTE — Telephone Encounter (Signed)
Unfortunately I have not been involved in the prescribing  The coumadin prescriptions often come from the coumadin clinic.  OK to ask pt to check with coumadin clinic, and forward this note if possible. thanks

## 2016-09-14 ENCOUNTER — Other Ambulatory Visit: Payer: Self-pay | Admitting: General Practice

## 2016-09-14 MED ORDER — WARFARIN SODIUM 5 MG PO TABS: ORAL_TABLET | ORAL | 1 refills | Status: DC

## 2016-09-14 NOTE — Telephone Encounter (Signed)
Rec'd call pt is needing refills on his coumadin. Per policy coumadin refills need to go to coumadin clinic since he is manage by them. Forwarding msg to [place] to follow-up w/pt so he is able to get his coumadin...Lind Guest

## 2016-09-20 ENCOUNTER — Telehealth: Payer: Self-pay | Admitting: Internal Medicine

## 2016-09-20 NOTE — Telephone Encounter (Signed)
Please follow back up with patient.

## 2016-09-20 NOTE — Telephone Encounter (Signed)
Patient called back in states that he got a message from you. He would not give me the specifics about the reason for his call, but I did not see any notes from you either. Please give him a call today before 2

## 2016-09-22 NOTE — Telephone Encounter (Signed)
Called pt back and let him know he was not called by CMA. Thanks.

## 2016-09-22 NOTE — Telephone Encounter (Signed)
I did not call this patient.

## 2016-09-24 ENCOUNTER — Ambulatory Visit (INDEPENDENT_AMBULATORY_CARE_PROVIDER_SITE_OTHER): Payer: PPO | Admitting: General Practice

## 2016-09-24 ENCOUNTER — Other Ambulatory Visit: Payer: Self-pay | Admitting: General Practice

## 2016-09-24 DIAGNOSIS — I2699 Other pulmonary embolism without acute cor pulmonale: Secondary | ICD-10-CM

## 2016-09-24 DIAGNOSIS — Z5181 Encounter for therapeutic drug level monitoring: Secondary | ICD-10-CM

## 2016-09-24 LAB — POCT INR: INR: 1.7

## 2016-09-24 MED ORDER — WARFARIN SODIUM 10 MG PO TABS: ORAL_TABLET | ORAL | 1 refills | Status: DC

## 2016-09-24 NOTE — Patient Instructions (Signed)
Pre visit review using our clinic review tool, if applicable. No additional management support is needed unless otherwise documented below in the visit note. 

## 2016-09-26 NOTE — Progress Notes (Signed)
I have reviewed and agree with the plan. 

## 2016-10-06 NOTE — Progress Notes (Signed)
[name], [place] [address] Phone: [phone] Subjective:     CC: right shoulder and knee pain f/u  [id number]  [name] is a 60 y.o. male coming in with complaint of right shoulder pain.  Had subacromial bursitis. Seems to be doing better as well. Patient states Shoulder seems to be doing much better. Sensitive 5% better. Not having as much pain. Continues to stay active otherwise.  Right knee pain- Have Arthritic Changes and Was Given an Injection 07/15/2016. Patient states  the injection did help for quite some time possibly 6 weeks but unfortunately the pain started coming back. Feels like it is starting to affect daily activities. Increasing instability of the knee. Patient feels that sometimes it swollen. Is waking him up at night. Affecting daily activities.     Past Medical History:  Diagnosis Date  . BACK PAIN   . COLONIC POLYPS, HX OF   . DEGENERATIVE JOINT DISEASE, KNEES, BILATERAL   . Depression   . DEPRESSION   . Diabetes mellitus    type II  . DIVERTICULOSIS, COLON   . DVT, lower extremity ([place])    right  . Embolus ([place]) 2005   pulmonary  . Hyperlipidemia   . Hypertension   . HYPOGONADISM, MALE   . HYPOTENSION, ORTHOSTATIC   . Kidney stones   . Long term current use of anticoagulant   . Obesity   . OBESITY, MORBID   . Obstructive sleep apnea    uses cpap setting 8 to 10  . Renal insufficiency   . RENAL INSUFFICIENCY    Past Surgical History:  Procedure Laterality Date  . ankle surgury  2001 bilateral   with bone spurs and torn tendon  . EDG  2008   chronic Duodenitis  . GASTRIC BYPASS    . HEEL SPUR SURGERY Bilateral 2005  . knee surgury  2000 & 2003    cartilage damage  . LUMBAR LAMINECTOMY/DECOMPRESSION MICRODISCECTOMY  09/28/2012   Procedure: LUMBAR LAMINECTOMY/DECOMPRESSION MICRODISCECTOMY 2 LEVELS;  Surgeon: [name], MD;  Location: [facility];  Service: Orthopedics;   Laterality: Left;  Decompressive laminectomy L4-L5. Microdiscectomy L5-S1  . REPLACEMENT TOTAL KNEE  2013  . TONSILLECTOMY AND ADENOIDECTOMY  age 47 or 14  . TOTAL KNEE ARTHROPLASTY  01/10/2012   Procedure: TOTAL KNEE ARTHROPLASTY;  Surgeon: [name], MD;  Location: [facility];  Service: Orthopedics;  Laterality: Left;   Social History   Social History  . Marital status: Single    Spouse name: N/A  . Number of children: 2  . Years of education: N/A   Occupational History  . back at work at [employer] since Jan. 2010 [place]  . Smoking status: Former Smoker    Packs/day: 0.25    Years: 5.00    Quit date: 12/06/1978  . Smokeless tobacco: Never Used  . Alcohol use None     Comment: occasional  . Drug use: No  . Sexual activity: Not Currently   Other Topics Concern  . None   Social History Narrative   Daily caffeine use one per day   Protein drinks    Live alone   Children; a friend and sister that helps   Brother that is around Legrand Como)   HAVE A SON AND DTR IN [place]   Allergies  Allergen Reactions  . Adhesive [Tape] Other (See Comments)    Painful, Please use "paper"  tape  . Other     NO BLOOD -JEHOVAH'S WITNESS-SIGNED REFUSAL BUT WOULD TAKE ALBUMIN   Family History  Problem Relation Age of Onset  . Hypertension Mother   . Prostate cancer Father   . Diabetes Sister     died with DM Apr 03, 2001, 2 more sisters with diabetes  . Heart failure Sister   . Diabetes Brother   . Diabetes Sister   . Diabetes Sister     Past medical history, social, surgical and family history all reviewed in electronic medical record.  No pertanent information unless stated regarding to the chief complaint.   Review of Systems: No headache, visual changes, nausea, vomiting, diarrhea, constipation, dizziness, abdominal pain, skin rash, fevers, chills, night sweats, weight loss, swollen lymph nodes, body aches, joint swelling, muscle aches, chest pain,  shortness of breath, mood changes.   Objective  Blood pressure (!) 150/84, pulse (!) 58, height 6\' 2"  (1.88 m), weight 255 lb 6.4 oz (115.8 kg).  Systems examined below as of 10/07/16 General: NAD A&O x3 mood, affect normal  HEENT: Pupils equal, extraocular movements intact no nystagmus Respiratory: not short of breath at rest or with speaking Cardiovascular: No lower extremity edema, non tender Skin: Warm dry intact with no signs of infection or rash on extremities or on axial skeleton. Abdomen: Soft nontender, no masses Neuro: Cranial nerves  intact, neurovascularly intact in all extremities with 2+ DTRs and 2+ pulses. Lymph: No lymphadenopathy appreciated today  Gait normal with good balance and coordination.  MSK: Non tender with full range of motion and good stability and symmetric strength and tone of lbows, wrist,  hips and ankles bilaterally.   Mild arthritic changes noted of multiple joints Knee:right Arthritic changes noted. Mild thigh to calf ratio disparity Increasing tenderness to palpation over the medial joint line ROM full in flexion and extension and lower leg rotation. Instability noted with valgus force still noted. Negative Mcmurray's, Apley's, and Thessalonian tests. Non painful patellar compression. Patellar glide with moderate crepitus. Patellar and quadriceps tendons unremarkable. Hamstring and quadriceps strength is normal.  Contralateral knee has replacement. Worsening from previous exam  After informed written and verbal consent, patient was seated on exam table. Left knee was prepped with alcohol swab and utilizing anterolateral approach, patient's left knee space was injected with15 mg/2.5 mL of Orthovisc(sodium hyaluronate) in a prefilled syringe was injected easily into the knee through a 22-gauge needle..Patient tolerated the procedure well without immediate complications.      Impression and Recommendations:     This case required medical decision  making of moderate complexity.      Note: This dictation was prepared with Dragon dictation along with smaller phrase technology. Any transcriptional errors that result from this process are unintentional.

## 2016-10-07 ENCOUNTER — Ambulatory Visit (INDEPENDENT_AMBULATORY_CARE_PROVIDER_SITE_OTHER): Payer: PPO | Admitting: Family Medicine

## 2016-10-07 ENCOUNTER — Encounter: Payer: Self-pay | Admitting: Family Medicine

## 2016-10-07 DIAGNOSIS — M1711 Unilateral primary osteoarthritis, right knee: Secondary | ICD-10-CM

## 2016-10-07 NOTE — Assessment & Plan Note (Signed)
Patient given injection today and tolerated the procedure well. We discussed icing regimen and home exercises. We discussed which activities to do in which ones to avoid. Patient will come back and see me again in 1 week for second in a series of 4 injections.

## 2016-10-07 NOTE — Patient Instructions (Signed)
od to see you  Orthovisc started today  Ice is your friend Take one of my pills in 6 hours.  We will get you a brace for the knee as well.  See me again in 1 weeks for 2nd injection.

## 2016-10-14 NOTE — Progress Notes (Signed)
[name] [address], [place] [address] Phone: [phone] Subjective:     [id number] pain follow-up  [id number]  [name] is a 60 y.o. male coming in with complaint of right shoulder pain.  Had subacromial bursitis. Seems to be doing better as well. Patient states Shoulder seems to be doing much better. Sensitive 5% better. Not having as much pain. Continues to stay active otherwise.  Right knee pain- Have Arthritic Changes and Was Given an Injection 07/15/2016. Atenolol the conservative therapy and started on Orthovisc injections last week. Patient is here for second in a series of 4 injections. Patient was to continue conservative therapy. Patient states No significant improvement at this time.     Past Medical History:  Diagnosis Date  . BACK PAIN   . COLONIC POLYPS, HX OF   . DEGENERATIVE JOINT DISEASE, KNEES, BILATERAL   . Depression   . DEPRESSION   . Diabetes mellitus    type II  . DIVERTICULOSIS, COLON   . DVT, lower extremity ([place])    right  . Embolus ([place]) 2005   pulmonary  . Hyperlipidemia   . Hypertension   . HYPOGONADISM, MALE   . HYPOTENSION, ORTHOSTATIC   . Kidney stones   . Long term current use of anticoagulant   . Obesity   . OBESITY, MORBID   . Obstructive sleep apnea    uses cpap setting 8 to 10  . Renal insufficiency   . RENAL INSUFFICIENCY    Past Surgical History:  Procedure Laterality Date  . ankle surgury  2001 bilateral   with bone spurs and torn tendon  . EDG  2008   chronic Duodenitis  . GASTRIC BYPASS    . HEEL SPUR SURGERY Bilateral 2005  . knee surgury  2000 & 2003    cartilage damage  . LUMBAR LAMINECTOMY/DECOMPRESSION MICRODISCECTOMY  09/28/2012   Procedure: LUMBAR LAMINECTOMY/DECOMPRESSION MICRODISCECTOMY 2 LEVELS;  Surgeon: [name], MD;  Location: [facility];  Service: Orthopedics;  Laterality: Left;  Decompressive laminectomy L4-L5. Microdiscectomy L5-S1  . REPLACEMENT  TOTAL KNEE  2013  . TONSILLECTOMY AND ADENOIDECTOMY  age 18 or 7  . TOTAL KNEE ARTHROPLASTY  01/10/2012   Procedure: TOTAL KNEE ARTHROPLASTY;  Surgeon: [name], MD;  [facility]: [facility];  Service: Orthopedics;  Laterality: Left;   Social History   Social History  . Marital status: Single    Spouse name: N/A  . Number of children: 2  . Years of education: N/A   Occupational History  . back at work at [employer] since Jan. 2010 [place]  . Smoking status: Former Smoker    Packs/day: 0.25    Years: 5.00    Quit date: 12/06/1978  . Smokeless tobacco: Never Used  . Alcohol use None     Comment: occasional  . Drug use: No  . Sexual activity: Not Currently   Other Topics Concern  . None   Social History Narrative   Daily caffeine use one per day   Protein drinks    Live alone   Children; a friend and sister that helps   Brother that is around Legrand Como)   HAVE A SON AND DTR IN [place]   Allergies  Allergen Reactions  . Adhesive [Tape] Other (See Comments)    Painful, Please use "paper" tape  . Other     NO BLOOD -JEHOVAH'S WITNESS-SIGNED REFUSAL BUT WOULD TAKE ALBUMIN  Family History  Problem Relation Age of Onset  . Hypertension Mother   . Prostate cancer Father   . Diabetes Sister     died with DM 03/11/2001, 2 more sisters with diabetes  . Heart failure Sister   . Diabetes Brother   . Diabetes Sister   . Diabetes Sister     Past medical history, social, surgical and family history all reviewed in electronic medical record.  No pertanent information unless stated regarding to the chief complaint.   Review of Systems: No headache, visual changes, nausea, vomiting, diarrhea, constipation, dizziness, abdominal pain, skin rash, fevers, chills, night sweats, weight loss, swollen lymph nodes, body aches, joint swelling, muscle aches, chest pain, shortness of breath, mood changes.   Objective  Blood pressure 124/82, pulse (!) 57,  height 6\' 2"  (1.88 m), weight 256 lb (116.1 kg), SpO2 98 %.  Systems examined below as of 10/15/16 General: NAD A&O x3 mood, affect normal  HEENT: Pupils equal, extraocular movements intact no nystagmus Respiratory: not short of breath at rest or with speaking Cardiovascular: No lower extremity edema, non tender Skin: Warm dry intact with no signs of infection or rash on extremities or on axial skeleton. Abdomen: Soft nontender, no masses Neuro: Cranial nerves  intact, neurovascularly intact in all extremities with 2+ DTRs and 2+ pulses. Lymph: No lymphadenopathy appreciated today  Gait normal with good balance and coordination.  MSK: Non tender with full range of motion and good stability and symmetric strength and tone of lbows, wrist,  hips and ankles bilaterally.   Mild arthritic changes noted of multiple joints Knee:right Arthritic changes noted. Mild thigh to calf ratio disparity Increasing tenderness to palpation over the medial joint line ROM full in flexion and extension and lower leg rotation. Instability noted with valgus force still noted. Negative Mcmurray's, Apley's, and Thessalonian tests. Non painful patellar compression. Patellar glide with moderate crepitus. Patellar and quadriceps tendons unremarkable. Hamstring and quadriceps strength is normal.  Contralateral knee has replacement. No change from previous exam  After informed written and verbal consent, patient was seated on exam table. right knee was prepped with alcohol swab and utilizing anterolateral approach, patient's left knee space was injected with15 mg/2.5 mL of Orthovisc(sodium hyaluronate) in a prefilled syringe was injected easily into the knee through a 22-gauge needle..Patient tolerated the procedure well without immediate complications.      Impression and Recommendations:     This case required medical decision making of moderate complexity.      Note: This dictation was prepared with Dragon  dictation along with smaller phrase technology. Any transcriptional errors that result from this process are unintentional.

## 2016-10-15 ENCOUNTER — Encounter: Payer: Self-pay | Admitting: Family Medicine

## 2016-10-15 ENCOUNTER — Ambulatory Visit (INDEPENDENT_AMBULATORY_CARE_PROVIDER_SITE_OTHER): Payer: PPO | Admitting: Family Medicine

## 2016-10-15 DIAGNOSIS — M1711 Unilateral primary osteoarthritis, right knee: Secondary | ICD-10-CM

## 2016-10-15 NOTE — Patient Instructions (Signed)
Good to see you  Ice is your friend  Stay active See me again in 1 week for #3 out of 4

## 2016-10-15 NOTE — Assessment & Plan Note (Signed)
Second in a series of 3 injections given today. Patient will come back and see me again in 1 week for third in a series of 4 injections continue conservative therapy otherwise.

## 2016-10-20 NOTE — Progress Notes (Signed)
[name] [address], [place] [address] Phone: [phone] Subjective:     [id number] pain follow-up  [id number]  [name] is a 60 y.o. male coming in with complaint of right Knee pain. Patient was seen previously and failed all conservative therapy. Patient is here for third out of a series of 4 injections of viscous supplementation. Patient states it feels some improvement with the last one. Feels like she is not having as much pain. Was able to even rake leaves the other day with some improvement.    Past Medical History:  Diagnosis Date  . BACK PAIN   . COLONIC POLYPS, HX OF   . DEGENERATIVE JOINT DISEASE, KNEES, BILATERAL   . Depression   . DEPRESSION   . Diabetes mellitus    type II  . DIVERTICULOSIS, COLON   . DVT, lower extremity ([place])    right  . Embolus ([place]) 2005   pulmonary  . Hyperlipidemia   . Hypertension   . HYPOGONADISM, MALE   . HYPOTENSION, ORTHOSTATIC   . Kidney stones   . Long term current use of anticoagulant   . Obesity   . OBESITY, MORBID   . Obstructive sleep apnea    uses cpap setting 8 to 10  . Renal insufficiency   . RENAL INSUFFICIENCY    Past Surgical History:  Procedure Laterality Date  . ankle surgury  2001 bilateral   with bone spurs and torn tendon  . EDG  2008   chronic Duodenitis  . GASTRIC BYPASS    . HEEL SPUR SURGERY Bilateral 2005  . knee surgury  2000 & 2003    cartilage damage  . LUMBAR LAMINECTOMY/DECOMPRESSION MICRODISCECTOMY  09/28/2012   Procedure: LUMBAR LAMINECTOMY/DECOMPRESSION MICRODISCECTOMY 2 LEVELS;  Surgeon: [name], MD;  [facility];  Service: Orthopedics;  Laterality: Left;  Decompressive laminectomy L4-L5. Microdiscectomy L5-S1  . REPLACEMENT TOTAL KNEE  2013  . TONSILLECTOMY AND ADENOIDECTOMY  age 43 or 79  . TOTAL KNEE ARTHROPLASTY  01/10/2012   Procedure: TOTAL KNEE ARTHROPLASTY;  Surgeon: [name], MD;  [facility];  Service:  Orthopedics;  Laterality: Left;   Social History   Social History  . Marital status: Single    Spouse name: N/A  . Number of children: 2  . Years of education: N/A   Occupational History  . back at work at [employer] since Jan. 2010 [place]  . Smoking status: Former Smoker    Packs/day: 0.25    Years: 5.00    Quit date: 12/06/1978  . Smokeless tobacco: Never Used  . Alcohol use None     Comment: occasional  . Drug use: No  . Sexual activity: Not Currently   Other Topics Concern  . None   Social History Narrative   Daily caffeine use one per day   Protein drinks    Live alone   Children; a friend and sister that helps   Brother that is around Legrand Como)   HAVE A SON AND DTR IN [place]   Allergies  Allergen Reactions  . Adhesive [Tape] Other (See Comments)    Painful, Please use "paper" tape  . Other     NO BLOOD -JEHOVAH'S WITNESS-SIGNED REFUSAL BUT WOULD TAKE ALBUMIN   Family History  Problem Relation Age of Onset  . Hypertension Mother   . Prostate cancer Father   . Diabetes Sister  died with DM 2002, 2 more sisters with diabetes  . Heart failure Sister   . Diabetes Brother   . Diabetes Sister   . Diabetes Sister     Past medical history, social, surgical and family history all reviewed in electronic medical record.  No pertanent information unless stated regarding to the chief complaint.   Review of Systems: No headache, visual changes, nausea, vomiting, diarrhea, constipation, dizziness, abdominal pain, skin rash, fevers, chills, night sweats, weight loss, swollen lymph nodes, body aches, joint swelling, muscle aches, chest pain, shortness of breath, mood changes.   Objective  Blood pressure 138/86, pulse (!) 56, height 6\' 2"  (1.88 m), weight 256 lb (116.1 kg), SpO2 97 %.  Systems examined below as of 10/21/16 General: NAD A&O x3 mood, affect normal  HEENT: Pupils equal, extraocular movements intact no  nystagmus Respiratory: not short of breath at rest or with speaking Cardiovascular: No lower extremity edema, non tender Skin: Warm dry intact with no signs of infection or rash on extremities or on axial skeleton. Abdomen: Soft nontender, no masses Neuro: Cranial nerves  intact, neurovascularly intact in all extremities with 2+ DTRs and 2+ pulses. Lymph: No lymphadenopathy appreciated today  Gait normal with good balance and coordination.  MSK: Non tender with full range of motion and good stability and symmetric strength and tone of lbows, wrist,  hips and ankles bilaterally.   Mild arthritic changes noted of multiple joints Knee:right Arthritic changes noted. Mild thigh to calf ratio disparity Increasing tenderness to palpation over the medial joint line ROM full in flexion and extension and lower leg rotation. Instability noted with valgus force still noted. Negative Mcmurray's, Apley's, and Thessalonian tests. Non painful patellar compression. Patellar glide with moderate crepitus. Patellar and quadriceps tendons unremarkable. Hamstring and quadriceps strength is normal.  Contralateral knee has replacement. No change from previous exam  After informed written and verbal consent, patient was seated on exam table. Right knee was prepped with alcohol swab and utilizing anterolateral approach, patient's right knee space was injected with15 mg/2.5 mL of Orthovisc(sodium hyaluronate) in a prefilled syringe was injected easily into the knee through a 22-gauge needle..Patient tolerated the procedure well without immediate complications.L.      Impression and Recommendations:     This case required medical decision making of moderate complexity.      Note: This dictation was prepared with Dragon dictation along with smaller phrase technology. Any transcriptional errors that result from this process are unintentional.

## 2016-10-21 ENCOUNTER — Encounter: Payer: Self-pay | Admitting: Family Medicine

## 2016-10-21 ENCOUNTER — Ambulatory Visit (INDEPENDENT_AMBULATORY_CARE_PROVIDER_SITE_OTHER): Payer: PPO | Admitting: General Practice

## 2016-10-21 ENCOUNTER — Ambulatory Visit (INDEPENDENT_AMBULATORY_CARE_PROVIDER_SITE_OTHER): Payer: PPO | Admitting: Family Medicine

## 2016-10-21 DIAGNOSIS — Z5181 Encounter for therapeutic drug level monitoring: Secondary | ICD-10-CM

## 2016-10-21 DIAGNOSIS — I2699 Other pulmonary embolism without acute cor pulmonale: Secondary | ICD-10-CM

## 2016-10-21 DIAGNOSIS — M1711 Unilateral primary osteoarthritis, right knee: Secondary | ICD-10-CM

## 2016-10-21 LAB — POCT INR: INR: 1.9

## 2016-10-21 NOTE — Patient Instructions (Signed)
Pre visit review using our clinic review tool, if applicable. No additional management support is needed unless otherwise documented below in the visit note. 

## 2016-10-21 NOTE — Patient Instructions (Addendum)
Good to see you  One more to go! They will get you the brace soon.  Ice is your friend.  See you again soon!

## 2016-10-21 NOTE — Assessment & Plan Note (Signed)
3 out of 4 done.  Continue current therapy Ice is good See in 10 days for 4th and final injection.

## 2016-10-21 NOTE — Progress Notes (Signed)
I have reviewed and agree with the plan. 

## 2016-10-22 ENCOUNTER — Ambulatory Visit: Payer: Self-pay

## 2016-11-05 ENCOUNTER — Ambulatory Visit (INDEPENDENT_AMBULATORY_CARE_PROVIDER_SITE_OTHER): Payer: PPO | Admitting: Family Medicine

## 2016-11-05 ENCOUNTER — Encounter: Payer: Self-pay | Admitting: Family Medicine

## 2016-11-05 DIAGNOSIS — M1711 Unilateral primary osteoarthritis, right knee: Secondary | ICD-10-CM

## 2016-11-05 DIAGNOSIS — H524 Presbyopia: Secondary | ICD-10-CM | POA: Diagnosis not present

## 2016-11-05 DIAGNOSIS — H52223 Regular astigmatism, bilateral: Secondary | ICD-10-CM | POA: Diagnosis not present

## 2016-11-05 DIAGNOSIS — H5213 Myopia, bilateral: Secondary | ICD-10-CM | POA: Diagnosis not present

## 2016-11-05 NOTE — Progress Notes (Signed)
[name] [address], [place] [address] Phone: [phone] Subjective:     [id number] pain follow-up  [id number]  [name] is a 60 y.o. male coming in with complaint of right Knee pain. Patient was seen previously and failed all conservative therapy. Patient is here for Fourth and final injection for viscous supplementation. Patient states he is noticing improvement. Patient states that it does not hurt quite as much. No significant instability. Patient is being fitted for the brace and we will have the custom bracing.    Past Medical History:  Diagnosis Date  . BACK PAIN   . COLONIC POLYPS, HX OF   . DEGENERATIVE JOINT DISEASE, KNEES, BILATERAL   . Depression   . DEPRESSION   . Diabetes mellitus    type II  . DIVERTICULOSIS, COLON   . DVT, lower extremity ([place])    right  . Embolus ([place]) 2005   pulmonary  . Hyperlipidemia   . Hypertension   . HYPOGONADISM, MALE   . HYPOTENSION, ORTHOSTATIC   . Kidney stones   . Long term current use of anticoagulant   . Obesity   . OBESITY, MORBID   . Obstructive sleep apnea    uses cpap setting 8 to 10  . Renal insufficiency   . RENAL INSUFFICIENCY    Past Surgical History:  Procedure Laterality Date  . ankle surgury  2001 bilateral   with bone spurs and torn tendon  . EDG  2008   chronic Duodenitis  . GASTRIC BYPASS    . HEEL SPUR SURGERY Bilateral 2005  . knee surgury  2000 & 2003    cartilage damage  . LUMBAR LAMINECTOMY/DECOMPRESSION MICRODISCECTOMY  09/28/2012   Procedure: LUMBAR LAMINECTOMY/DECOMPRESSION MICRODISCECTOMY 2 LEVELS;  Surgeon: [name], MD;  [facility];  Service: Orthopedics;  Laterality: Left;  Decompressive laminectomy L4-L5. Microdiscectomy L5-S1  . REPLACEMENT TOTAL KNEE  2013  . TONSILLECTOMY AND ADENOIDECTOMY  age 35 or 45  . TOTAL KNEE ARTHROPLASTY  01/10/2012   Procedure: TOTAL KNEE ARTHROPLASTY;  Surgeon: [name], MD;  [facility];  Service: Orthopedics;  Laterality: Left;   Social History   Social History  . Marital status: Single    Spouse name: N/A  . Number of children: 2  . Years of education: N/A   Occupational History  . back at work at [employer] since Jan. 2010 [place]  . Smoking status: Former Smoker    Packs/day: 0.25    Years: 5.00    Quit date: 12/06/1978  . Smokeless tobacco: Never Used  . Alcohol use None     Comment: occasional  . Drug use: No  . Sexual activity: Not Currently   Other Topics Concern  . None   Social History Narrative   Daily caffeine use one per day   Protein drinks    Live alone   Children; a friend and sister that helps   Brother that is around Legrand Como)   HAVE A SON AND DTR IN [place]   Allergies  Allergen Reactions  . Adhesive [Tape] Other (See Comments)    Painful, Please use "paper" tape  . Other     NO BLOOD -JEHOVAH'S WITNESS-SIGNED REFUSAL BUT WOULD TAKE ALBUMIN   Family History  Problem Relation Age of Onset  . Hypertension Mother   . Prostate cancer Father   . Diabetes Sister     died with  DM 2002, 2 more sisters with diabetes  . Heart failure Sister   . Diabetes Brother   . Diabetes Sister   . Diabetes Sister     Past medical history, social, surgical and family history all reviewed in electronic medical record.  No pertanent information unless stated regarding to the chief complaint.   Review of Systems: No headache, visual changes, nausea, vomiting, diarrhea, constipation, dizziness, abdominal pain, skin rash, fevers, chills, night sweats, weight loss, swollen lymph nodes, body aches, joint swelling, muscle aches, chest pain, shortness of breath, mood changes.   Objective  Blood pressure 122/82, weight 252 lb (114.3 kg).  Systems examined below as of 11/05/16 General: NAD A&O x3 mood, affect normal  HEENT: Pupils equal, extraocular movements intact no nystagmus Respiratory: not short of  breath at rest or with speaking Cardiovascular: No lower extremity edema, non tender Skin: Warm dry intact with no signs of infection or rash on extremities or on axial skeleton. Abdomen: Soft nontender, no masses Neuro: Cranial nerves  intact, neurovascularly intact in all extremities with 2+ DTRs and 2+ pulses. Lymph: No lymphadenopathy appreciated today  Gait normal with good balance and coordination.  MSK: Non tender with full range of motion and good stability and symmetric strength and tone of lbows, wrist,  hips and ankles bilaterally.   Mild arthritic changes noted of multiple joints Knee:right Arthritic changes noted. Mild thigh to calf ratio disparity Increasing tenderness to palpation over the medial joint line ROM full in flexion and extension and lower leg rotation. Instability noted with valgus force still noted. Negative Mcmurray's, Apley's, and Thessalonian tests. Non painful patellar compression. Patellar glide with moderate crepitus. Patellar and quadriceps tendons unremarkable. Hamstring and quadriceps strength is normal.  Contralateral knee has replacement. Once again no change from previous exam  After informed written and verbal consent, patient was seated on exam table. Right knee was prepped with alcohol swab and utilizing anterolateral approach, patient's left knee space was injected with15 mg/2.5 mL of Orthovisc(sodium hyaluronate) in a prefilled syringe was injected easily into the knee through a 22-gauge needle..Patient tolerated the procedure well without immediate complications.      Impression and Recommendations:     This case required medical decision making of moderate complexity.      Note: This dictation was prepared with Dragon dictation along with smaller phrase technology. Any transcriptional errors that result from this process are unintentional.

## 2016-11-05 NOTE — Patient Instructions (Signed)
Good to see you  Last one  Get a break from me If not great see me again in 4 weeks Happy holidays!

## 2016-11-05 NOTE — Assessment & Plan Note (Signed)
Fourth and final injection of viscous supple mentation today. We discussed icing regimen and home exercise. Patient can continue with topical anti-inflammatories if it is beneficial. Patient will continue with the brace. We can repeat series every 6 months if needed. Patient come back again in 4-6 weeks for further evaluation and treatment.

## 2016-11-15 ENCOUNTER — Ambulatory Visit (INDEPENDENT_AMBULATORY_CARE_PROVIDER_SITE_OTHER): Payer: PPO | Admitting: General Practice

## 2016-11-15 ENCOUNTER — Ambulatory Visit (INDEPENDENT_AMBULATORY_CARE_PROVIDER_SITE_OTHER): Payer: PPO | Admitting: Family Medicine

## 2016-11-15 DIAGNOSIS — Z5181 Encounter for therapeutic drug level monitoring: Secondary | ICD-10-CM

## 2016-11-15 DIAGNOSIS — Z23 Encounter for immunization: Secondary | ICD-10-CM | POA: Diagnosis not present

## 2016-11-15 DIAGNOSIS — I2699 Other pulmonary embolism without acute cor pulmonale: Secondary | ICD-10-CM

## 2016-11-15 LAB — POCT INR: INR: 1.7

## 2016-11-15 NOTE — Progress Notes (Signed)
I agree with this plan.

## 2016-11-15 NOTE — Patient Instructions (Signed)
Pre visit review using our clinic review tool, if applicable. No additional management support is needed unless otherwise documented below in the visit note. 

## 2016-11-17 ENCOUNTER — Ambulatory Visit: Payer: Self-pay

## 2016-11-23 DIAGNOSIS — M1711 Unilateral primary osteoarthritis, right knee: Secondary | ICD-10-CM | POA: Diagnosis not present

## 2016-12-10 ENCOUNTER — Ambulatory Visit (INDEPENDENT_AMBULATORY_CARE_PROVIDER_SITE_OTHER): Payer: PPO | Admitting: Family Medicine

## 2016-12-10 ENCOUNTER — Encounter: Payer: Self-pay | Admitting: Family Medicine

## 2016-12-10 DIAGNOSIS — M1711 Unilateral primary osteoarthritis, right knee: Secondary | ICD-10-CM

## 2016-12-10 NOTE — Assessment & Plan Note (Signed)
Doing much better after viscous supple mentation. Encourage him to continue conservative therapy. Encourage him to wear the brace on a more regular basis. Follow-up again as needed.

## 2016-12-10 NOTE — Progress Notes (Signed)
[name] [address], [place] [address] Phone: [phone] Subjective:     CC: Right knee pain  [id number]  [name] is a 61 y.o. male coming in with complaint of right knee pain. Patient's has severe osteophytic changes. Patient finished viscous supple mentation 1 month ago. Feels like he is doing significantly better. He did get his custom brace but has not been wearing it regularly. No new symptoms. Overall happy with the results.     Past Medical History:  Diagnosis Date  . BACK PAIN   . COLONIC POLYPS, HX OF   . DEGENERATIVE JOINT DISEASE, KNEES, BILATERAL   . Depression   . DEPRESSION   . Diabetes mellitus    type II  . DIVERTICULOSIS, COLON   . DVT, lower extremity ([place])    right  . Embolus ([place]) 03-12-04   pulmonary  . Hyperlipidemia   . Hypertension   . HYPOGONADISM, MALE   . HYPOTENSION, ORTHOSTATIC   . Kidney stones   . Long term current use of anticoagulant   . Obesity   . OBESITY, MORBID   . Obstructive sleep apnea    uses cpap setting 8 to 10  . Renal insufficiency   . RENAL INSUFFICIENCY    Past Surgical History:  Procedure Laterality Date  . ankle surgury  2001 bilateral   with bone spurs and torn tendon  . EDG  03-13-2007   chronic Duodenitis  . GASTRIC BYPASS    . HEEL SPUR SURGERY Bilateral 03-12-2004  . knee surgury  2000 & 03/12/2002    cartilage damage  . LUMBAR LAMINECTOMY/DECOMPRESSION MICRODISCECTOMY  09/28/2012   Procedure: LUMBAR LAMINECTOMY/DECOMPRESSION MICRODISCECTOMY 2 LEVELS;  Surgeon: [name], MD;  [facility];  Service: Orthopedics;  Laterality: Left;  Decompressive laminectomy L4-L5. Microdiscectomy L5-S1  . REPLACEMENT TOTAL KNEE  March 12, 2012  . TONSILLECTOMY AND ADENOIDECTOMY  age 64 or 33  . TOTAL KNEE ARTHROPLASTY  01/10/2012   Procedure: TOTAL KNEE ARTHROPLASTY;  Surgeon: [name], MD;  [facility];  Service: Orthopedics;  Laterality: Left;   Social History   Social  History  . Marital status: Single    Spouse name: N/A  . Number of children: 2  . Years of education: N/A   Occupational History  . back at work at [employer] since Jan. 2010 [place]  . Smoking status: Former Smoker    Packs/day: 0.25    Years: 5.00    Quit date: 12/06/1978  . Smokeless tobacco: Never Used  . Alcohol use None     Comment: occasional  . Drug use: No  . Sexual activity: Not Currently   Other Topics Concern  . None   Social History Narrative   Daily caffeine use one per day   Protein drinks    Live alone   Children; a friend and sister that helps   Brother that is around Legrand Como)   HAVE A SON AND DTR IN [place]   Allergies  Allergen Reactions  . Adhesive [Tape] Other (See Comments)    Painful, Please use "paper" tape  . Other     NO BLOOD -JEHOVAH'S WITNESS-SIGNED REFUSAL BUT WOULD TAKE ALBUMIN   Family History  Problem Relation Age of Onset  . Hypertension Mother   . Prostate cancer Father   . Diabetes Sister     died with DM 03-12-2001, 2 more sisters with diabetes  . Heart  failure Sister   . Diabetes Brother   . Diabetes Sister   . Diabetes Sister     Past medical history, social, surgical and family history all reviewed in electronic medical record.  No pertanent information unless stated regarding to the chief complaint.   Review of Systems: No headache, visual changes, nausea, vomiting, diarrhea, constipation, dizziness, abdominal pain, skin rash, fevers, chills, night sweats, weight loss, swollen lymph nodes, body aches, joint swelling, muscle aches, chest pain, shortness of breath, mood changes.  .   Objective  Blood pressure (!) 142/80, pulse (!) 56, height 6\' 2"  (1.88 m), weight 259 lb (117.5 kg), SpO2 97 %. Systems examined below as of 12/10/16   General: No apparent distress alert and oriented x3 mood and affect normal, dressed appropriately.  HEENT: Pupils equal, extraocular movements intact    Respiratory: Patient's speak in full sentences and does not appear short of breath  Cardiovascular: No lower extremity edema, non tender, no erythema  Skin: Warm dry intact with no signs of infection or rash on extremities or on axial skeleton.  Abdomen: Soft nontender  Neuro: Cranial nerves II through XII are intact, neurovascularly intact in all extremities with 2+ DTRs and 2+ pulses.  Lymph: No lymphadenopathy of posterior or anterior cervical chain or axillae bilaterally.  Gait mild antalgic gait.  MSK:  Non tender with full range of motion and good stability and symmetric strength and tone of shoulders, elbows, wrist, hip, and ankles bilaterally.  Knee: Right Valgus deformity noted Palpation normal with no warmth, joint line tenderness, patellar tenderness, or condyle tenderness. ROM full in flexion and extension and lower leg rotation. Continues to have instability with valgus force Negative Mcmurray's, Apley's, and Thessalonian tests. Mild painful patellar compression. Patellar glide moderate crepitus. Patellar and quadriceps tendons unremarkable. Hamstring and quadriceps strength is normal.  Contralateral knee has some mild arthritic changes as well.    Impression and Recommendations:     This case required medical decision making of moderate complexity.      Note: This dictation was prepared with Dragon dictation along with smaller phrase technology. Any transcriptional errors that result from this process are unintentional.

## 2016-12-10 NOTE — Patient Instructions (Addendum)
Good to see you  [phone] matt  See me when you need me

## 2016-12-15 ENCOUNTER — Ambulatory Visit (INDEPENDENT_AMBULATORY_CARE_PROVIDER_SITE_OTHER): Payer: PPO | Admitting: General Practice

## 2016-12-15 DIAGNOSIS — Z5181 Encounter for therapeutic drug level monitoring: Secondary | ICD-10-CM

## 2016-12-15 DIAGNOSIS — I2699 Other pulmonary embolism without acute cor pulmonale: Secondary | ICD-10-CM

## 2016-12-15 LAB — POCT INR: INR: 1.8

## 2016-12-15 NOTE — Progress Notes (Signed)
I have reviewed and agree with the plan. 

## 2016-12-15 NOTE — Patient Instructions (Signed)
Pre visit review using our clinic review tool, if applicable. No additional management support is needed unless otherwise documented below in the visit note. 

## 2016-12-22 ENCOUNTER — Other Ambulatory Visit: Payer: Self-pay | Admitting: Internal Medicine

## 2016-12-28 ENCOUNTER — Encounter: Payer: Self-pay | Admitting: Internal Medicine

## 2016-12-28 ENCOUNTER — Ambulatory Visit (INDEPENDENT_AMBULATORY_CARE_PROVIDER_SITE_OTHER): Payer: PPO | Admitting: Internal Medicine

## 2016-12-28 ENCOUNTER — Other Ambulatory Visit (INDEPENDENT_AMBULATORY_CARE_PROVIDER_SITE_OTHER): Payer: PPO

## 2016-12-28 VITALS — BP 140/80 | HR 69 | Temp 98.3°F | Resp 20 | Wt 263.0 lb

## 2016-12-28 DIAGNOSIS — E785 Hyperlipidemia, unspecified: Secondary | ICD-10-CM

## 2016-12-28 DIAGNOSIS — Z0001 Encounter for general adult medical examination with abnormal findings: Secondary | ICD-10-CM

## 2016-12-28 DIAGNOSIS — E119 Type 2 diabetes mellitus without complications: Secondary | ICD-10-CM | POA: Diagnosis not present

## 2016-12-28 DIAGNOSIS — I10 Essential (primary) hypertension: Secondary | ICD-10-CM | POA: Diagnosis not present

## 2016-12-28 LAB — BASIC METABOLIC PANEL
BUN: 20 mg/dL (ref 6–23)
CHLORIDE: 109 meq/L (ref 96–112)
CO2: 29 meq/L (ref 19–32)
Calcium: 9 mg/dL (ref 8.4–10.5)
Creatinine, Ser: 0.97 mg/dL (ref 0.40–1.50)
GFR: 100.52 mL/min (ref >60.00)
GLUCOSE: 98 mg/dL (ref 70–99)
Potassium: 4.3 mEq/L (ref 3.5–5.1)
SODIUM: 142 meq/L (ref 135–145)

## 2016-12-28 LAB — HEPATIC FUNCTION PANEL
ALBUMIN: 4.2 g/dL (ref 3.5–5.2)
ALK PHOS: 87 U/L (ref 39–117)
ALT: 22 U/L (ref 0–53)
AST: 20 U/L (ref 0–37)
Bilirubin, Direct: 0.1 mg/dL (ref 0.0–0.3)
TOTAL PROTEIN: 6.8 g/dL (ref 6.0–8.3)
Total Bilirubin: 0.5 mg/dL (ref 0.2–1.2)

## 2016-12-28 LAB — LIPID PANEL
CHOLESTEROL: 138 mg/dL (ref 0–200)
HDL: 45.8 mg/dL (ref >39.00)
LDL Cholesterol: 68 mg/dL (ref 0–99)
NONHDL: 92.33
Total CHOL/HDL Ratio: 3
Triglycerides: 122 mg/dL (ref 0.0–149.0)
VLDL: 24.4 mg/dL (ref 0.0–40.0)

## 2016-12-28 LAB — HEMOGLOBIN A1C: HEMOGLOBIN A1C: 6.2 % (ref 4.6–6.5)

## 2016-12-28 NOTE — Assessment & Plan Note (Signed)
stable overall by history and exam, recent data reviewed with pt, and pt to continue medical treatment as before,  to f/u any worsening symptoms or concerns BP Readings from Last 3 Encounters:  12/28/16 140/80  12/10/16 (!) 142/80  11/05/16 122/82

## 2016-12-28 NOTE — Assessment & Plan Note (Signed)
stable overall by history and exam, recent data reviewed with pt, and pt to continue medical treatment as before,  to f/u any worsening symptoms or concerns Lab Results  Component Value Date   HGBA1C 6.0 06/24/2016   For f/u lab today

## 2016-12-28 NOTE — Progress Notes (Signed)
Pre visit review using our clinic review tool, if applicable. No additional management support is needed unless otherwise documented below in the visit note. 

## 2016-12-28 NOTE — Patient Instructions (Signed)

## 2016-12-28 NOTE — Progress Notes (Signed)
Subjective:    Patient ID: [name], male    DOB: 1956-02-13, 61 y.o.   MRN: [id number]  HPI   Here to f/u; overall doing ok,  Pt denies chest pain, increasing sob or doe, wheezing, orthopnea, PND, increased LE swelling, palpitations, dizziness or syncope.  Pt denies new neurological symptoms such as new headache, or facial or extremity weakness or numbness.  Pt denies polydipsia, polyuria, or low sugar episode.   Pt denies new neurological symptoms such as new headache, or facial or extremity weakness or numbness.   Pt states overall good compliance with meds, mostly trying to follow appropriate diet, with wt overall stable,  but little exercise however. No new complaints. Has gained several lbs with doiet indiscretion. Wt Readings from Last 3 Encounters:  12/28/16 263 lb (119.3 kg)  12/10/16 259 lb (117.5 kg)  11/05/16 252 lb (114.3 kg)  Had recent dental procedure with pain and swelling now improved.  No other new hx Past Medical History:  Diagnosis Date  . BACK PAIN   . COLONIC POLYPS, HX OF   . DEGENERATIVE JOINT DISEASE, KNEES, BILATERAL   . Depression   . DEPRESSION   . Diabetes mellitus    type II  . DIVERTICULOSIS, COLON   . DVT, lower extremity ([place])    right  . Embolus ([place]) 2005   pulmonary  . Hyperlipidemia   . Hypertension   . HYPOGONADISM, MALE   . HYPOTENSION, ORTHOSTATIC   . Kidney stones   . Long term current use of anticoagulant   . Obesity   . OBESITY, MORBID   . Obstructive sleep apnea    uses cpap setting 8 to 10  . Renal insufficiency   . RENAL INSUFFICIENCY    Past Surgical History:  Procedure Laterality Date  . ankle surgury  2001 bilateral   with bone spurs and torn tendon  . EDG  2008   chronic Duodenitis  . GASTRIC BYPASS    . HEEL SPUR SURGERY Bilateral 2005  . knee surgury  2000 & 2003    cartilage damage  . LUMBAR LAMINECTOMY/DECOMPRESSION MICRODISCECTOMY  09/28/2012   Procedure: LUMBAR LAMINECTOMY/DECOMPRESSION MICRODISCECTOMY 2  LEVELS;  Surgeon: [name], MD;  Location: [facility];  Service: Orthopedics;  Laterality: Left;  Decompressive laminectomy L4-L5. Microdiscectomy L5-S1  . REPLACEMENT TOTAL KNEE  2013  . TONSILLECTOMY AND ADENOIDECTOMY  age 23 or 18  . TOTAL KNEE ARTHROPLASTY  01/10/2012   Procedure: TOTAL KNEE ARTHROPLASTY;  Surgeon: [name], MD;  Location: [facility];  Service: Orthopedics;  Laterality: Left;    reports that he quit smoking about 38 years ago. He has a 1.25 pack-year smoking history. He has never used smokeless tobacco. He reports that he does not use drugs. His alcohol history is not on file. family history includes Diabetes in his brother, sister, sister, and sister; Heart failure in his sister; Hypertension in his mother; Prostate cancer in his father. Allergies  Allergen Reactions  . Adhesive [Tape] Other (See Comments)    Painful, Please use "paper" tape  . Other     NO BLOOD -JEHOVAH'S WITNESS-SIGNED REFUSAL BUT WOULD TAKE ALBUMIN   Current Outpatient Prescriptions on File Prior to Visit  Medication Sig Dispense Refill  . Artificial Tear Ointment (ARTIFICIAL TEARS) ointment Place 1 drop into both eyes as needed (for dry eyes).    Marland Kitchen aspirin EC 81 MG tablet Take 81 mg by mouth daily.    Marland Kitchen atorvastatin (LIPITOR) 80 MG  tablet TAKE 1 TABLET (80 MG TOTAL) BY MOUTH DAILY. (Patient taking differently: TAKE 1 TABLET (80 MG TOTAL) BY MOUTH every evening) 90 tablet 3  . Calcium Carbonate (CALCI-CHEW PO) Take 1 tablet by mouth daily.     . Cyanocobalamin (VITAMIN B-12 SL) Place 1 tablet under the tongue daily.    Marland Kitchen escitalopram (LEXAPRO) 10 MG tablet TAKE 1 TABLET (10 MG TOTAL) BY MOUTH DAILY. 90 tablet 1  . Ferrous Sulfate (IRON) 90 (18 Fe) MG TABS Take 18 mg by mouth daily.    . fluticasone (FLONASE) 50 MCG/ACT nasal spray Place 2 sprays into both nostrils daily. 16 g 2  . lisinopril (PRINIVIL,ZESTRIL) 5 MG tablet TAKE 1 TABLET (5 MG TOTAL) BY MOUTH DAILY. 90 tablet 1  . LYRICA 50 MG  capsule TAKE ONE CAPSULE BY MOUTH TWICE A DAY 60 capsule 5  . metFORMIN (GLUCOPHAGE) 500 MG tablet Take 1 tablet (500 mg total) by mouth daily with breakfast. 180 tablet 3  . Multiple Vitamin (MULTIVITAMIN WITH MINERALS) TABS tablet Take 1 tablet by mouth daily.    . nitroGLYCERIN (NITROSTAT) 0.4 MG SL tablet Place 1 tablet (0.4 mg total) under the tongue every 5 (five) minutes as needed for chest pain. 90 tablet 3  . polyethylene glycol (MIRALAX / GLYCOLAX) packet Take 17 g by mouth daily as needed for mild constipation.    . senna-docusate (SENNA PLUS) 8.6-50 MG tablet Take 1 tablet by mouth daily as needed for mild constipation. Reported on 03/22/2016    . warfarin (COUMADIN) 10 MG tablet Take as directed by anticoagulation clinic. 90 tablet 1   No current facility-administered medications on file prior to visit.    Review of Systems  Constitutional: Negative for unusual diaphoresis or night sweats HENT: Negative for ear swelling or discharge Eyes: Negative for worsening visual haziness  Respiratory: Negative for choking and stridor.   Gastrointestinal: Negative for distension or worsening eructation Genitourinary: Negative for retention or change in urine volume.  Musculoskeletal: Negative for other MSK pain or swelling Skin: Negative for color change and worsening wound Neurological: Negative for tremors and numbness other than noted  Psychiatric/Behavioral: Negative for decreased concentration or agitation other than above   All other system neg per pt    Objective:   Physical Exam BP 140/80   Pulse 69   Temp 98.3 F (36.8 C) (Oral)   Resp 20   Wt 263 lb (119.3 kg)   SpO2 98%   BMI 33.77 kg/m  VS noted,  Constitutional: Pt appears in no apparent distress HENT: Head: NCAT.  Right Ear: External ear normal.  Left Ear: External ear normal.  Eyes: . Pupils are equal, round, and reactive to light. Conjunctivae and EOM are normal Neck: Normal range of motion. Neck supple.    Cardiovascular: Normal rate and regular rhythm.   Pulmonary/Chest: Effort normal and breath sounds without rales or wheezing.  Neurological: Pt is alert. Not confused , motor grossly intact Skin: Skin is warm. No rash, no LE edema Psychiatric: Pt behavior is normal. No agitation.  No other new exam findings       Assessment & Plan:

## 2016-12-28 NOTE — Assessment & Plan Note (Signed)
stable overall by history and exam, recent data reviewed with pt, and pt to continue medical treatment as before,  to f/u any worsening symptoms or concerns Lab Results  Component Value Date   LDLCALC 55 06/24/2016

## 2016-12-31 ENCOUNTER — Ambulatory Visit (INDEPENDENT_AMBULATORY_CARE_PROVIDER_SITE_OTHER): Payer: PPO | Admitting: Family Medicine

## 2016-12-31 ENCOUNTER — Encounter: Payer: Self-pay | Admitting: Family Medicine

## 2016-12-31 VITALS — BP 142/80 | HR 63 | Temp 98.3°F | Ht 74.0 in | Wt 257.0 lb

## 2016-12-31 DIAGNOSIS — R51 Headache: Secondary | ICD-10-CM | POA: Diagnosis not present

## 2016-12-31 DIAGNOSIS — R519 Headache, unspecified: Secondary | ICD-10-CM

## 2016-12-31 NOTE — Progress Notes (Signed)
Subjective:     Patient ID: [name], male   DOB: 05/23/1956, 61 y.o.   MRN: [id number]  HPI Patient seen for acute visit with left optical headache which started couple days ago. Symptoms are very intermittent and very transient. He describes a sharp pain which usually last a few seconds and is in the left occipital area with some radiation toward the neck. 8 out of 10 severity at its worst. No known injury. He denies any associated weakness, speech change, confusion, blurred vision, nausea, vomiting. No history of migraine headaches. No fevers or chills. No positional triggers. Has not taken any medications for this.  He is on Coumadin with past history of pulmonary embolism but again no recent head injury. He has past history of left lumbar radiculopathy and apparently takes Lyrica 50 mg but only once daily. No history of similar headache in the past. Denies recent appetite or weight changes.  Past Medical History:  Diagnosis Date  . BACK PAIN   . COLONIC POLYPS, HX OF   . DEGENERATIVE JOINT DISEASE, KNEES, BILATERAL   . Depression   . DEPRESSION   . Diabetes mellitus    type II  . DIVERTICULOSIS, COLON   . DVT, lower extremity ([place])    right  . Embolus ([place]) 2005   pulmonary  . Hyperlipidemia   . Hypertension   . HYPOGONADISM, MALE   . HYPOTENSION, ORTHOSTATIC   . Kidney stones   . Long term current use of anticoagulant   . Obesity   . OBESITY, MORBID   . Obstructive sleep apnea    uses cpap setting 8 to 10  . Renal insufficiency   . RENAL INSUFFICIENCY    Past Surgical History:  Procedure Laterality Date  . ankle surgury  2001 bilateral   with bone spurs and torn tendon  . EDG  2008   chronic Duodenitis  . GASTRIC BYPASS    . HEEL SPUR SURGERY Bilateral 2005  . knee surgury  2000 & 2003    cartilage damage  . LUMBAR LAMINECTOMY/DECOMPRESSION MICRODISCECTOMY  09/28/2012   Procedure: LUMBAR LAMINECTOMY/DECOMPRESSION MICRODISCECTOMY 2 LEVELS;  Surgeon: [name], MD;  Location: [facility];  Service: Orthopedics;  Laterality: Left;  Decompressive laminectomy L4-L5. Microdiscectomy L5-S1  . REPLACEMENT TOTAL KNEE  2013  . TONSILLECTOMY AND ADENOIDECTOMY  age 12 or 59  . TOTAL KNEE ARTHROPLASTY  01/10/2012   Procedure: TOTAL KNEE ARTHROPLASTY;  Surgeon: [name], MD;  Location: [facility];  Service: Orthopedics;  Laterality: Left;    reports that he quit smoking about 38 years ago. He has a 1.25 pack-year smoking history. He has never used smokeless tobacco. He reports that he does not use drugs. His alcohol history is not on file. family history includes Diabetes in his brother, sister, sister, and sister; Heart failure in his sister; Hypertension in his mother; Prostate cancer in his father. Allergies  Allergen Reactions  . Adhesive [Tape] Other (See Comments)    Painful, Please use "paper" tape  . Other     NO BLOOD -JEHOVAH'S WITNESS-SIGNED REFUSAL BUT WOULD TAKE ALBUMIN     Review of Systems  Constitutional: Negative for chills and fever.  HENT: Negative for sinus pain.   Eyes: Negative for visual disturbance.  Respiratory: Negative for shortness of breath.   Cardiovascular: Negative for chest pain.  Neurological: Positive for headaches. Negative for dizziness, syncope and weakness.  Psychiatric/Behavioral: Negative for confusion.       Objective:  Physical Exam  Constitutional: He is oriented to person, place, and time. He appears well-developed and well-nourished. No distress.  Eyes: Pupils are equal, round, and reactive to light.  Neck: Neck supple.  Cardiovascular: Normal rate and regular rhythm.   Pulmonary/Chest: Effort normal and breath sounds normal. No respiratory distress. He has no wheezes. He has no rales.  Neurological: He is alert and oriented to person, place, and time. No cranial nerve deficit. Coordination normal.  No focal weakness. Finger to nose normal. Gait normal.       Assessment:     Patient resents  with very transient left occipital headaches lasting only a few seconds. Question neuralgic. He has nonfocal neuro exam at this time.    Plan:     -He will increase his Lyrica to 50 mg twice a day -We recommended a follow-up immediately for any confusion, vomiting, visual changes, slurred speech, focal weakness, or any other new neurologic concerns. -Consider neurology consult if symptoms persist  Eulas Post MD [facility] at [facility]

## 2016-12-31 NOTE — Patient Instructions (Signed)
General Headache Without Cause A headache is pain or discomfort felt around the head or neck area. The specific cause of a headache may not be found. There are many causes and types of headaches. A few common ones are:  Tension headaches.  Migraine headaches.  Cluster headaches.  Chronic daily headaches. Follow these instructions at home: Watch your condition for any changes. Take these steps to help with your condition: Managing pain  Take over-the-counter and prescription medicines only as told by your health care provider.  Lie down in a dark, quiet room when you have a headache.  If directed, apply ice to the head and neck area:  Put ice in a plastic bag.  Place a towel between your skin and the bag.  Leave the ice on for 20 minutes, 2-3 times per day.  Use a heating pad or hot shower to apply heat to the head and neck area as told by your health care provider.  Keep lights dim if bright lights bother you or make your headaches worse. Eating and drinking  Eat meals on a regular schedule.  Limit alcohol use.  Decrease the amount of caffeine you drink, or stop drinking caffeine. General instructions  Keep all follow-up visits as told by your health care provider. This is important.  Keep a headache journal to help find out what may trigger your headaches. For example, write down:  What you eat and drink.  How much sleep you get.  Any change to your diet or medicines.  Try massage or other relaxation techniques.  Limit stress.  Sit up straight, and do not tense your muscles.  Do not use tobacco products, including cigarettes, chewing tobacco, or e-cigarettes. If you need help quitting, ask your health care provider.  Exercise regularly as told by your health care provider.  Sleep on a regular schedule. Get 7-9 hours of sleep, or the amount recommended by your health care provider. Contact a health care provider if:  Your symptoms are not helped by  medicine.  You have a headache that is different from the usual headache.  You have nausea or you vomit.  You have a fever. Get help right away if:  Your headache becomes severe.  You have repeated vomiting.  You have a stiff neck.  You have a loss of vision.  You have problems with speech.  You have pain in the eye or ear.  You have muscular weakness or loss of muscle control.  You lose your balance or have trouble walking.  You feel faint or pass out.  You have confusion. This information is not intended to replace advice given to you by your health care provider. Make sure you discuss any questions you have with your health care provider. Document Released: 11/22/2005 Document Revised: 04/29/2016 Document Reviewed: 03/17/2015 Elsevier Interactive Patient Education  2017 [place] ahead and increase Lyrica back to one twice daily Follow up by Monday if headache persists and sooner for any worsening pain or new symptoms.

## 2016-12-31 NOTE — Progress Notes (Signed)
Pre visit review using our clinic review tool, if applicable. No additional management support is needed unless otherwise documented below in the visit note. 

## 2017-01-12 ENCOUNTER — Ambulatory Visit (INDEPENDENT_AMBULATORY_CARE_PROVIDER_SITE_OTHER): Payer: PPO | Admitting: General Practice

## 2017-01-12 DIAGNOSIS — Z5181 Encounter for therapeutic drug level monitoring: Secondary | ICD-10-CM

## 2017-01-12 LAB — POCT INR: INR: 1.4

## 2017-01-12 NOTE — Patient Instructions (Signed)
Pre visit review using our clinic review tool, if applicable. No additional management support is needed unless otherwise documented below in the visit note. 

## 2017-01-12 NOTE — Progress Notes (Signed)
I have reviewed and agree with the plan. 

## 2017-01-21 ENCOUNTER — Ambulatory Visit (INDEPENDENT_AMBULATORY_CARE_PROVIDER_SITE_OTHER): Payer: PPO | Admitting: General Practice

## 2017-01-21 DIAGNOSIS — Z5181 Encounter for therapeutic drug level monitoring: Secondary | ICD-10-CM | POA: Diagnosis not present

## 2017-01-21 LAB — POCT INR: INR: 3.2

## 2017-01-21 NOTE — Progress Notes (Signed)
I have reviewed and agree with the plan. 

## 2017-01-21 NOTE — Patient Instructions (Signed)
Pre visit review using our clinic review tool, if applicable. No additional management support is needed unless otherwise documented below in the visit note. 

## 2017-01-30 ENCOUNTER — Other Ambulatory Visit: Payer: Self-pay | Admitting: Internal Medicine

## 2017-02-18 ENCOUNTER — Ambulatory Visit (INDEPENDENT_AMBULATORY_CARE_PROVIDER_SITE_OTHER): Payer: PPO | Admitting: General Practice

## 2017-02-18 DIAGNOSIS — Z5181 Encounter for therapeutic drug level monitoring: Secondary | ICD-10-CM | POA: Diagnosis not present

## 2017-02-18 LAB — POCT INR: INR: 3

## 2017-02-18 NOTE — Progress Notes (Signed)
I have reviewed and agree with the plan. 

## 2017-02-18 NOTE — Patient Instructions (Signed)
Pre visit review using our clinic review tool, if applicable. No additional management support is needed unless otherwise documented below in the visit note. 

## 2017-03-13 ENCOUNTER — Other Ambulatory Visit: Payer: Self-pay | Admitting: Internal Medicine

## 2017-03-18 ENCOUNTER — Ambulatory Visit (INDEPENDENT_AMBULATORY_CARE_PROVIDER_SITE_OTHER): Payer: PPO | Admitting: General Practice

## 2017-03-18 DIAGNOSIS — Z5181 Encounter for therapeutic drug level monitoring: Secondary | ICD-10-CM | POA: Diagnosis not present

## 2017-03-18 LAB — POCT INR: INR: 2.6

## 2017-03-18 NOTE — Patient Instructions (Signed)
Pre visit review using our clinic review tool, if applicable. No additional management support is needed unless otherwise documented below in the visit note. 

## 2017-03-18 NOTE — Progress Notes (Signed)
I have reviewed and agree with the plan. 

## 2017-04-11 ENCOUNTER — Ambulatory Visit: Payer: Self-pay

## 2017-04-11 ENCOUNTER — Ambulatory Visit: Payer: PPO

## 2017-04-11 ENCOUNTER — Other Ambulatory Visit: Payer: Self-pay

## 2017-04-11 ENCOUNTER — Ambulatory Visit (INDEPENDENT_AMBULATORY_CARE_PROVIDER_SITE_OTHER): Payer: PPO | Admitting: Sports Medicine

## 2017-04-11 ENCOUNTER — Encounter: Payer: Self-pay | Admitting: Sports Medicine

## 2017-04-11 ENCOUNTER — Ambulatory Visit (INDEPENDENT_AMBULATORY_CARE_PROVIDER_SITE_OTHER): Payer: PPO

## 2017-04-11 VITALS — BP 150/100 | HR 56 | Ht 74.0 in | Wt 259.0 lb

## 2017-04-11 DIAGNOSIS — G8929 Other chronic pain: Secondary | ICD-10-CM

## 2017-04-11 DIAGNOSIS — M25561 Pain in right knee: Secondary | ICD-10-CM | POA: Diagnosis not present

## 2017-04-11 DIAGNOSIS — M1711 Unilateral primary osteoarthritis, right knee: Secondary | ICD-10-CM | POA: Diagnosis not present

## 2017-04-11 NOTE — Progress Notes (Signed)
OFFICE VISIT NOTE [name]. [name], [facility] - 61 y.o. male MRN [id number]  Date of birth: [birth year]  Visit Date: 04/11/2017  PCP: Biagio Borg, MD   Referred by: Biagio Borg, MD  Jari Sportsman, cma acting as scribe for Dr. Paulla Fore.  SUBJECTIVE:   Chief Complaint  Patient presents with  . NP: OA Right Knee   HPI: As below and per problem based documentation when appropriate.  [name] has chronic RT knee OA. On 11/2016 he completed the Viscous injections. Today he reports a 1-2 week HX of lateral to posterior knee pain with stiffness. At times it feels like it catches. No recent injury. Sitting for a long period of time then standing triggers the pain. No pain when sitting a chair but if he extends it in any way he feels the pain. Pain is descirbed as sharp and dull.     ROS  Otherwise per HPI.  HISTORY & PERTINENT PRIOR DATA:  No specialty comments available. He reports that he quit smoking about 38 years ago. He has a 1.25 pack-year smoking history. He has never used smokeless tobacco.   Recent Labs  06/24/16 1402 12/28/16 1109  HGBA1C 6.0 6.2   Medications & Allergies reviewed per EMR Patient Active Problem List   Diagnosis Date Noted  . Degenerative arthritis of right knee 07/15/2016  . Acute bursitis of right shoulder 07/15/2016  . Right shoulder pain 06/24/2016  . Right knee pain 06/24/2016  . Superficial phlebitis of arm 03/18/2016  . Abdominal pain 12/23/2015  . Rash 10/29/2015  . Chest pain 03/27/2015  . Dysuria 03/27/2015  . Diabetes ([place]) 10/01/2014  . Nipple pain 03/13/2014  . Breast mass in male 03/13/2014  . Encounter for therapeutic drug monitoring 01/25/2014  . Peripheral edema 10/10/2013  . OSA (obstructive sleep apnea) 09/11/2013  . Lesion of penis 01/01/2013  . Left lumbar radiculopathy 06/14/2012  . S/P left knee replacement 01/10/2012  . Preventative  health care 12/16/2011  . Back pain with radiation 09/18/2011  . Long term current use of anticoagulant 04/08/2011  . Pulmonary embolism ([place]) 02/05/2011  . HYPOTENSION, ORTHOSTATIC 01/29/2010  . CONSTIPATION 01/21/2010  . DEGENERATIVE JOINT DISEASE, KNEES, BILATERAL 11/17/2009  . SMOKER 09/12/2009  . HYPOGONADISM, MALE 03/04/2008  . OBESITY, MORBID 03/04/2008  . Depression 10/05/2007  . DIVERTICULOSIS, COLON 10/05/2007  . RENAL INSUFFICIENCY 10/05/2007  . COLONIC POLYPS, HX OF 10/05/2007  . Hyperlipidemia 06/30/2007  . Essential hypertension 06/30/2007   Past Medical History:  Diagnosis Date  . BACK PAIN   . COLONIC POLYPS, HX OF   . DEGENERATIVE JOINT DISEASE, KNEES, BILATERAL   . Depression   . DEPRESSION   . Diabetes mellitus    type II  . DIVERTICULOSIS, COLON   . DVT, lower extremity ([place])    right  . Embolus ([place]) 2005   pulmonary  . Hyperlipidemia   . Hypertension   . HYPOGONADISM, MALE   . HYPOTENSION, ORTHOSTATIC   . Kidney stones   . Long term current use of anticoagulant   . Obesity   . OBESITY, MORBID   . Obstructive sleep apnea    uses cpap setting 8 to 10  . Renal insufficiency   . RENAL INSUFFICIENCY    Family History  Problem Relation Age of Onset  . Hypertension Mother   . Prostate cancer Father   . Diabetes Sister  died with DM 2002, 2 more sisters with diabetes  . Heart failure Sister   . Diabetes Brother   . Diabetes Sister   . Diabetes Sister    Past Surgical History:  Procedure Laterality Date  . ankle surgury  2001 bilateral   with bone spurs and torn tendon  . EDG  2008   chronic Duodenitis  . GASTRIC BYPASS    . HEEL SPUR SURGERY Bilateral 2005  . knee surgury  2000 & 2003    cartilage damage  . LUMBAR LAMINECTOMY/DECOMPRESSION MICRODISCECTOMY  09/28/2012   Procedure: LUMBAR LAMINECTOMY/DECOMPRESSION MICRODISCECTOMY 2 LEVELS;  Surgeon: [name], MD;  Location: [facility];  Service: Orthopedics;  Laterality: Left;   Decompressive laminectomy L4-L5. Microdiscectomy L5-S1  . REPLACEMENT TOTAL KNEE  2013  . TONSILLECTOMY AND ADENOIDECTOMY  age 36 or 86  . TOTAL KNEE ARTHROPLASTY  01/10/2012   Procedure: TOTAL KNEE ARTHROPLASTY;  Surgeon: [name], MD;  Location: [facility];  Service: Orthopedics;  Laterality: Left;   Social History   Occupational History  . back at work at [employer] since Jan. 2010 [place]  . Smoking status: Former Smoker    Packs/day: 0.25    Years: 5.00    Quit date: 12/06/1978  . Smokeless tobacco: Never Used  . Alcohol use Not on file     Comment: occasional  . Drug use: No  . Sexual activity: Not Currently    OBJECTIVE:  VS:  HT:6\' 2"  (188 cm)   WT:259 lb (117.5 kg)  BMI:33.3    BP:(!) 150/100  HR:(!) 56bpm  TEMP: ( )  RESP:97 % EXAM: WDWN, NAD, Non-toxic appearing Alert & appropriately interactive Not depressed or anxious appearing No increased work of breathing. Pupils are equal. EOM intact without nystagmus No clubbing or cyanosis of the extremities appreciated No significant rashes/lesions/ulcerations overlying the examined area. DP & PT pulses 2+/4.  No significant pretibial edema. Sensation intact to light touch in upper and lower extremities. Bilateral knees: Left knee with postsurgical midline incision from total knee arthroplasty well-healed.  No significant effusion. Right knee with posterior lateral pain.  Stable ligamentously to anterior and posterior drawer.  Stable endpoints with varus and valgus strain with 3-4 mm of opening.  Pain with patellar grind but minimal.  Pain with McMurray's.  Unable to perform [facility] due to discomfort.   No additional findings.    PROCEDURE NOTE: ULTRASOUND GUIDED RIGHT KNEE ASPIRATION & INJECTION  Images were obtained and interpreted by myself, Teresa Coombs, DO  Images have been saved and stored to PACS system. Images obtained on: GE S7 Ultrasound machine  ULTRASOUND  FINDINGS: Large effusion  DESCRIPTION OF PROCEDURE:  The patient's clinical condition is marked by substantial pain and/or significant functional disability. Other conservative therapy has not provided relief, is contraindicated, or not appropriate. There is a reasonable likelihood that injection will significantly improve the patient's pain and/or functional impairment. After discussing the risks, benefits and expected outcomes of the injection and all questions were reviewed and answered, the patient wished to undergo the above named procedure. Verbal consent was obtained. The ultrasound was used to identify the target structure and adjacent neurovascular structures. The skin was then prepped in sterile fashion and the target structure was injected under direct visualization using sterile technique as below: PREP: Alcohol, Ethel Chloride, 3cc 1% lidocaine on 25 needle APPROACH: Superiolateral, stopcock technique, 21g 2" needle INJECTATE: 2 cc 0.5% marcaine, 1 cc 40mg  DepoMedrol ASPIRATE:  25 cc slightly orange is yellow colored, clear synovial fluid DRESSING: Band-Aid  Post procedural instructions including recommending icing and warning signs for infection were reviewed. This procedure was well tolerated and there were no complications.   IMPRESSION: Succesful US Guided Aspiration & injection   Dg Knee 1-2 Views Right  Result Date: 04/11/2017 CLINICAL DATA:  Chronic RIGHT knee pain. EXAM: RIGHT KNEE - 1-2 VIEW COMPARISON:  None. FINDINGS: There is tricompartmental degenerative change, most severe medially. No effusion or fracture. IMPRESSION: RIGHT knee DJD. Electronically Signed   By: Staci Righter M.D.   On: 04/11/2017 13:44   ASSESSMENT & PLAN:  Visit Diagnoses:  1. Chronic pain of right knee   2. Primary osteoarthritis of right knee    Problem List Items Addressed This Visit    Right knee pain - Primary   Relevant Orders   DG Knee 1-2 Views Right (Completed)   US GUIDED NEEDLE  PLACEMENT(NO LINKED CHARGES)   Degenerative arthritis of right knee    Injection and aspiration today with 25 cc of fluid Patient has not been comfortable wearing his medial off loader brace.  Compression sleeve discussed with him today. Continue with VMO thinning. Ice as needed If any lack of improvement concern for potential intra-articular free body and could consider operative intervention        Meds: No orders of the defined types were placed in this encounter.   Orders:  Orders Placed This Encounter  Procedures  . DG Knee 1-2 Views Right  . US GUIDED NEEDLE PLACEMENT(NO LINKED CHARGES)    Follow-up: Return if symptoms worsen or fail to improve.   CMA/ATC served as Education administrator during this visit. History, Physical, and Plan performed by medical provider. Documentation and orders reviewed and attested to.      Teresa Coombs, [facility] Physician    04/11/2017 5:51 PM

## 2017-04-11 NOTE — Assessment & Plan Note (Signed)
Injection and aspiration today with 25 cc of fluid Patient has not been comfortable wearing his medial off loader brace.  Compression sleeve discussed with him today. Continue with VMO thinning. Ice as needed If any lack of improvement concern for potential intra-articular free body and could consider operative intervention

## 2017-04-15 ENCOUNTER — Ambulatory Visit (INDEPENDENT_AMBULATORY_CARE_PROVIDER_SITE_OTHER): Payer: PPO | Admitting: General Practice

## 2017-04-15 DIAGNOSIS — Z5181 Encounter for therapeutic drug level monitoring: Secondary | ICD-10-CM | POA: Diagnosis not present

## 2017-04-15 LAB — POCT INR: INR: 3.3

## 2017-04-15 NOTE — Patient Instructions (Signed)
Pre visit review using our clinic review tool, if applicable. No additional management support is needed unless otherwise documented below in the visit note. 

## 2017-04-15 NOTE — Progress Notes (Signed)
I have reviewed and agree with the plan. 

## 2017-04-20 ENCOUNTER — Ambulatory Visit (INDEPENDENT_AMBULATORY_CARE_PROVIDER_SITE_OTHER): Payer: PPO | Admitting: Ophthalmology

## 2017-04-20 DIAGNOSIS — H35033 Hypertensive retinopathy, bilateral: Secondary | ICD-10-CM

## 2017-04-20 DIAGNOSIS — H33301 Unspecified retinal break, right eye: Secondary | ICD-10-CM

## 2017-04-20 DIAGNOSIS — H43813 Vitreous degeneration, bilateral: Secondary | ICD-10-CM | POA: Diagnosis not present

## 2017-04-20 DIAGNOSIS — I10 Essential (primary) hypertension: Secondary | ICD-10-CM

## 2017-04-20 LAB — HM DIABETES EYE EXAM

## 2017-05-03 ENCOUNTER — Other Ambulatory Visit: Payer: Self-pay | Admitting: Internal Medicine

## 2017-05-13 ENCOUNTER — Ambulatory Visit (INDEPENDENT_AMBULATORY_CARE_PROVIDER_SITE_OTHER): Payer: PPO | Admitting: General Practice

## 2017-05-13 DIAGNOSIS — Z5181 Encounter for therapeutic drug level monitoring: Secondary | ICD-10-CM | POA: Diagnosis not present

## 2017-05-13 LAB — POCT INR: INR: 3.1

## 2017-05-13 NOTE — Patient Instructions (Signed)
Pre visit review using our clinic review tool, if applicable. No additional management support is needed unless otherwise documented below in the visit note. 

## 2017-05-13 NOTE — Progress Notes (Signed)
I have reviewed and agree with the plan. 

## 2017-05-29 NOTE — Progress Notes (Signed)
[name] [address] [place], [place] [address] Phone: [phone] Subjective:    I'm seeing this patient by the request  of:    CC: Right knee pain  [id number]  [name] is a 61 y.o. male coming in with complaint of right knee pain. Right knee pain. Patient has been seen previously. Has degenerative joint disease. Has responded to steroid injections as well as viscous supplementation. Was seen 8 weeks ago by primary care provider who did have to aspirate knee. Patient states Only mild improvement from proximal wing week and worsening pain again. States that is causing instability. Did respond very well to viscous supplementation previously and would like to see if he can make some improvement.     Past Medical History:  Diagnosis Date  . BACK PAIN   . COLONIC POLYPS, HX OF   . DEGENERATIVE JOINT DISEASE, KNEES, BILATERAL   . Depression   . DEPRESSION   . Diabetes mellitus    type II  . DIVERTICULOSIS, COLON   . DVT, lower extremity ([place])    right  . Embolus ([place]) 2005   pulmonary  . Hyperlipidemia   . Hypertension   . HYPOGONADISM, MALE   . HYPOTENSION, ORTHOSTATIC   . Kidney stones   . Long term current use of anticoagulant   . Obesity   . OBESITY, MORBID   . Obstructive sleep apnea    uses cpap setting 8 to 10  . Renal insufficiency   . RENAL INSUFFICIENCY    Past Surgical History:  Procedure Laterality Date  . ankle surgury  2001 bilateral   with bone spurs and torn tendon  . EDG  2008   chronic Duodenitis  . GASTRIC BYPASS    . HEEL SPUR SURGERY Bilateral 2005  . knee surgury  2000 & 2003    cartilage damage  . LUMBAR LAMINECTOMY/DECOMPRESSION MICRODISCECTOMY  09/28/2012   Procedure: LUMBAR LAMINECTOMY/DECOMPRESSION MICRODISCECTOMY 2 LEVELS;  Surgeon: [name], MD;  Location: [facility];  Service: Orthopedics;  Laterality: Left;  Decompressive laminectomy L4-L5. Microdiscectomy L5-S1  . REPLACEMENT TOTAL KNEE   2013  . TONSILLECTOMY AND ADENOIDECTOMY  age 70 or 28  . TOTAL KNEE ARTHROPLASTY  01/10/2012   Procedure: TOTAL KNEE ARTHROPLASTY;  Surgeon: [name], MD;  Location: [facility];  Service: Orthopedics;  Laterality: Left;   Social History   Social History  . Marital status: Single    Spouse name: N/A  . Number of children: 2  . Years of education: N/A   Occupational History  . back at work at [employer] since Jan. 2010 [place]  . Smoking status: Former Smoker    Packs/day: 0.25    Years: 5.00    Quit date: 12/06/1978  . Smokeless tobacco: Never Used  . Alcohol use Not on file     Comment: occasional  . Drug use: No  . Sexual activity: Not Currently   Other Topics Concern  . Not on file   Social History Narrative   Daily caffeine use one per day   Protein drinks    Live alone   Children; a friend and sister that helps   Brother that is around Legrand Como)   HAVE A SON AND DTR IN [place]   Allergies  Allergen Reactions  . Adhesive [Tape] Other (See Comments)    Painful, Please use "paper" tape  . Other     NO BLOOD -  JEHOVAH'S WITNESS-SIGNED REFUSAL BUT WOULD TAKE ALBUMIN   Family History  Problem Relation Age of Onset  . Hypertension Mother   . Prostate cancer Father   . Diabetes Sister        died with DM March 17, 2001, 2 more sisters with diabetes  . Heart failure Sister   . Diabetes Brother   . Diabetes Sister   . Diabetes Sister     Past medical history, social, surgical and family history all reviewed in electronic medical record.  No pertanent information unless stated regarding to the chief complaint.   Review of Systems: No headache, visual changes, nausea, vomiting, diarrhea, constipation, dizziness, abdominal pain, skin rash, fevers, chills, night sweats, weight loss, swollen lymph nodes, body aches, joint swelling,  chest pain, shortness of breath, mood changes.  Positive muscle aches   Objective  There were no vitals  taken for this visit. Systems examined below as of 05/29/17   General: No apparent distress alert and oriented x3 mood and affect normal, dressed appropriately.  HEENT: Pupils equal, extraocular movements intact  Respiratory: Patient's speak in full sentences and does not appear short of breath  Cardiovascular: +1lower extremity edema, Left sidetender, no erythema  Skin: Warm dry intact with no signs of infection or rash on extremities or on axial skeleton.  Abdomen: Soft nontender  Neuro: Cranial nerves II through XII are intact, neurovascularly intact in all extremities with 2+ DTRs and 2+ pulses.  Lymph: No lymphadenopathy of posterior or anterior cervical chain or axillae bilaterally.  Gait  antalgic gait  MSK:  Non tender with full range of motion and good stability and symmetric strength and tone of shoulders, elbows, wrist, hip, and ankles bilaterally.  Knee:right  valgus deformity noted. Large thigh to calf ratio.  Tender to palpation over medial and PF joint line.  ROM full in flexion and extension and lower leg rotation. instability with valgus force.  painful patellar compression. Patellar glide with moderate crepitus. Patellar and quadriceps tendons unremarkable. Hamstring and quadriceps strength is normal. Contralateral knee shows  Replacement   After informed written and verbal consent, patient was seated on exam table. Right knee was prepped with alcohol swab and utilizing anterolateral approach, patient's right knee space was injected with15 mg/2.5 mL of Orthovisc(sodium hyaluronate) in a prefilled syringe was injected easily into the knee through a 22-gauge needle..Patient tolerated the procedure well without immediate complications.   Impression and Recommendations:     This case required medical decision making of moderate complexity.      Note: This dictation was prepared with Dragon dictation along with smaller phrase technology. Any transcriptional errors  that result from this process are unintentional.

## 2017-05-30 ENCOUNTER — Encounter: Payer: Self-pay | Admitting: Family Medicine

## 2017-05-30 ENCOUNTER — Ambulatory Visit (INDEPENDENT_AMBULATORY_CARE_PROVIDER_SITE_OTHER): Payer: PPO | Admitting: Family Medicine

## 2017-05-30 DIAGNOSIS — M1711 Unilateral primary osteoarthritis, right knee: Secondary | ICD-10-CM

## 2017-05-30 NOTE — Assessment & Plan Note (Signed)
Worsening symptoms and failed conservative therapy. Patient given Orthovisc injection today. They'll her to the procedure well. Discussed icing regimen and home exercises. Discussed objective recently do a which ones to avoid. Patient come back in 1 week for second in a series of 4 injections.

## 2017-05-30 NOTE — Patient Instructions (Addendum)
good to see you  You know the drill.  Ice 20 minutes 2 times daily. Usually after activity and before bed. Exercises 3 times a week.  You did good with the injection before and hopefully you will again Continue the vitamins See you again in 1 week.

## 2017-06-08 NOTE — Progress Notes (Signed)
[name] [address] [place], [place] [address] Phone: [phone] Subjective:     CC: Right knee pain f/u  LEX:[id number]  ZEPH RIEBEL is a 61 y.o. male coming in with complaint of right knee pain. Patient has end-stage osteophytic changes. Patient is here failing all conservative therapy for second in a series of 4 injections for viscous supplementation. Patient states   Has made some improvement.Has made some improvement.     Past Medical History:  Diagnosis Date  . BACK PAIN   . COLONIC POLYPS, HX OF   . DEGENERATIVE JOINT DISEASE, KNEES, BILATERAL   . Depression   . DEPRESSION   . Diabetes mellitus    type II  . DIVERTICULOSIS, COLON   . DVT, lower extremity ([facility])    right  . Embolus ([facility]) 03/14/04   pulmonary  . Hyperlipidemia   . Hypertension   . HYPOGONADISM, MALE   . HYPOTENSION, ORTHOSTATIC   . Kidney stones   . Long term current use of anticoagulant   . Obesity   . OBESITY, MORBID   . Obstructive sleep apnea    uses cpap setting 8 to 10  . Renal insufficiency   . RENAL INSUFFICIENCY    Past Surgical History:  Procedure Laterality Date  . ankle surgury  2001 bilateral   with bone spurs and torn tendon  . EDG  03/15/07   chronic Duodenitis  . GASTRIC BYPASS    . HEEL SPUR SURGERY Bilateral Mar 14, 2004  . knee surgury  2000 & 2002-03-14    cartilage damage  . LUMBAR LAMINECTOMY/DECOMPRESSION MICRODISCECTOMY  09/28/2012   Procedure: LUMBAR LAMINECTOMY/DECOMPRESSION MICRODISCECTOMY 2 LEVELS;  Surgeon: [name], MD;  [facility];  Service: Orthopedics;  Laterality: Left;  Decompressive laminectomy L4-L5. Microdiscectomy L5-S1  . REPLACEMENT TOTAL KNEE  03/14/2012  . TONSILLECTOMY AND ADENOIDECTOMY  age 32 or 30  . TOTAL KNEE ARTHROPLASTY  01/10/2012   Procedure: TOTAL KNEE ARTHROPLASTY;  Surgeon: [name], MD;  [facility];  Service: Orthopedics;  Laterality: Left;   Social History   Social History  . Marital status:  Single    Spouse name: N/A  . Number of children: 2  . Years of education: N/A   Occupational History  . back at work at [employer] since Jan. 2010 [place]  . Smoking status: Former Smoker    Packs/day: 0.25    Years: 5.00    Quit date: 12/06/1978  . Smokeless tobacco: Never Used  . Alcohol use None     Comment: occasional  . Drug use: No  . Sexual activity: Not Currently   Other Topics Concern  . None   Social History Narrative   Daily caffeine use one per day   Protein drinks    Live alone   Children; a friend and sister that helps   Brother that is around Legrand Como)   HAVE A SON AND DTR IN [place]   Allergies  Allergen Reactions  . Adhesive [Tape] Other (See Comments)    Painful, Please use "paper" tape  . Other     NO BLOOD -JEHOVAH'S WITNESS-SIGNED REFUSAL BUT WOULD TAKE ALBUMIN   Family History  Problem Relation Age of Onset  . Hypertension Mother   . Prostate cancer Father   . Diabetes Sister        died with DM 03-14-01, 2 more sisters with diabetes  . Heart failure Sister   .  Diabetes Brother   . Diabetes Sister   . Diabetes Sister     Past medical history, social, surgical and family history all reviewed in electronic medical record.  No pertanent information unless stated regarding to the chief complaint.   Review of Systems: No headache, visual changes, nausea, vomiting, diarrhea, constipation, dizziness, abdominal pain, skin rash, fevers, chills, night sweats, weight loss, swollen lymph nodes, body aches, joint swelling, muscle aches, chest pain, shortness of breath, mood changes.    Objective  Blood pressure 140/84, pulse (!) 57, height 6\' 2"  (1.88 m), weight 262 lb (118.8 kg), SpO2 96 %.   Systems examined below as of 06/09/17 General: NAD A&O x3 mood, affect normal  HEENT: Pupils equal, extraocular movements intact no nystagmus Respiratory: not short of breath at rest or with speaking Cardiovascular: No  lower extremity edema, non tender Skin: Warm dry intact with no signs of infection or rash on extremities or on axial skeleton. Abdomen: Soft nontender, no masses Neuro: Cranial nerves  intact, neurovascularly intact in all extremities with 2+ DTRs and 2+ pulses. Lymph: No lymphadenopathy appreciated today  Gait  antalgic gait  MSK:  Non tender with full range of motion and good stability and symmetric strength and tone of shoulders, elbows, wrist, hip, and ankles bilaterally.  Knee: Right valgus deformity noted. Large thigh to calf ratio.  Tender to palpation over medial and PF joint line.  ROM full in flexion and extension and lower leg rotation. instability with valgus force.  painful patellar compression. Patellar glide with moderate crepitus. Patellar and quadriceps tendons unremarkable. Hamstring and quadriceps strength is normal. Contralateral knee shows mild osteophytic changes as well  After informed written and verbal consent, patient was seated on exam table. Right knee was prepped with alcohol swab and utilizing anterolateral approach, patient's right knee space was injected with15 mg/2.5 mL of Orthovisc(sodium hyaluronate) in a prefilled syringe was injected easily into the knee through a 22-gauge needle..Patient tolerated the procedure well without immediate complications.    Impression and Recommendations:     This case required medical decision making of moderate complexity.      Note: This dictation was prepared with Dragon dictation along with smaller phrase technology. Any transcriptional errors that result from this process are unintentional.

## 2017-06-09 ENCOUNTER — Ambulatory Visit (INDEPENDENT_AMBULATORY_CARE_PROVIDER_SITE_OTHER): Payer: PPO | Admitting: Family Medicine

## 2017-06-09 ENCOUNTER — Encounter: Payer: Self-pay | Admitting: Family Medicine

## 2017-06-09 DIAGNOSIS — M1711 Unilateral primary osteoarthritis, right knee: Secondary | ICD-10-CM | POA: Diagnosis not present

## 2017-06-09 NOTE — Patient Instructions (Signed)
Good to see you  2 down and 2 to go  Ice is your friend.  You know the drill  See you again in 1 week!

## 2017-06-09 NOTE — Assessment & Plan Note (Signed)
Patient given another injection today. In 2 and a series of 4 injections given. Discussed icing regimen and home exercises. We discussed which activities doing which ones to avoid. Follow-up again in 1 week for third injection.

## 2017-06-15 ENCOUNTER — Ambulatory Visit (INDEPENDENT_AMBULATORY_CARE_PROVIDER_SITE_OTHER): Payer: PPO | Admitting: General Practice

## 2017-06-15 DIAGNOSIS — Z5181 Encounter for therapeutic drug level monitoring: Secondary | ICD-10-CM

## 2017-06-15 LAB — POCT INR: INR: 2.5

## 2017-06-15 NOTE — Progress Notes (Signed)
I have reviewed and agree with the plan. 

## 2017-06-15 NOTE — Patient Instructions (Signed)
Pre visit review using our clinic review tool, if applicable. No additional management support is needed unless otherwise documented below in the visit note. 

## 2017-06-16 ENCOUNTER — Encounter: Payer: Self-pay | Admitting: Family Medicine

## 2017-06-16 ENCOUNTER — Other Ambulatory Visit: Payer: Self-pay

## 2017-06-16 ENCOUNTER — Ambulatory Visit (INDEPENDENT_AMBULATORY_CARE_PROVIDER_SITE_OTHER): Payer: PPO | Admitting: Family Medicine

## 2017-06-16 DIAGNOSIS — M1711 Unilateral primary osteoarthritis, right knee: Secondary | ICD-10-CM

## 2017-06-16 MED ORDER — ESCITALOPRAM OXALATE 10 MG PO TABS: 10.0000 mg | ORAL_TABLET | Freq: Every day | ORAL | 0 refills | Status: DC

## 2017-06-16 NOTE — Assessment & Plan Note (Signed)
Dirtiness series of 4 injections given today. Continue conservative therapy. Follow up in 1 week for fourth and final injection

## 2017-06-16 NOTE — Progress Notes (Signed)
[name] [address] [place], [place] [address] Phone: [phone] Subjective:     CC: Right knee pain f/u  [id number]  CHAI ROUTH is a 61 y.o. male coming in with complaint of right knee pain. Patient has end-stage osteophytic changes. Patient is here failing all conservative therapy for 3rd in a series of 4 injections for viscous supplementation. Patient states  Continues to make improvement. 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    Past Medical History:  Diagnosis Date  . BACK PAIN   . COLONIC POLYPS, HX OF   . DEGENERATIVE JOINT DISEASE, KNEES, BILATERAL   . Depression   . DEPRESSION   . Diabetes mellitus    type II  . DIVERTICULOSIS, COLON   . DVT, lower extremity ([place])    right  . Embolus ([facility]) 03-14-2004   pulmonary  . Hyperlipidemia   . Hypertension   . HYPOGONADISM, MALE   . HYPOTENSION, ORTHOSTATIC   . Kidney stones   . Long term current use of anticoagulant   . Obesity   . OBESITY, MORBID   . Obstructive sleep apnea    uses cpap setting 8 to 10  . Renal insufficiency   . RENAL INSUFFICIENCY    Past Surgical History:  Procedure Laterality Date  . ankle surgury  2001 bilateral   with bone spurs and torn tendon  . EDG  03/15/07   chronic Duodenitis  . GASTRIC BYPASS    . HEEL SPUR SURGERY Bilateral March 14, 2004  . knee surgury  2000 & 03-14-02    cartilage damage  . LUMBAR LAMINECTOMY/DECOMPRESSION MICRODISCECTOMY  09/28/2012   Procedure: LUMBAR LAMINECTOMY/DECOMPRESSION MICRODISCECTOMY 2 LEVELS;  Surgeon: [name], MD;  [facility];  Service: Orthopedics;  Laterality: Left;  Decompressive laminectomy L4-L5. Microdiscectomy L5-S1  . REPLACEMENT TOTAL KNEE  03/14/12  . TONSILLECTOMY AND ADENOIDECTOMY  age 89 or 40  . TOTAL KNEE ARTHROPLASTY  01/10/2012   Procedure: TOTAL KNEE ARTHROPLASTY;  Surgeon: [name], MD;  [facility];  Service: Orthopedics;  Laterality: Left;   Social History    Social History  . Marital status: Single    Spouse name: N/A  . Number of children: 2  . Years of education: N/A   Occupational History  . back at work at [employer] since Jan. 2010 [place]  . Smoking status: Former Smoker    Packs/day: 0.25    Years: 5.00    Quit date: 12/06/1978  . Smokeless tobacco: Never Used  . Alcohol use None     Comment: occasional  . Drug use: No  . Sexual activity: Not Currently   Other Topics Concern  . None   Social History Narrative   Daily caffeine use one per day   Protein drinks    Live alone   Children; a friend and sister that helps   Brother that is around Legrand [place])   HAVE A SON AND DTR IN [place]   Allergies  Allergen Reactions  . Adhesive [Tape] Other (See Comments)    Painful, Please use "paper" tape  . Other     NO BLOOD -JEHOVAH'S WITNESS-SIGNED REFUSAL BUT WOULD TAKE ALBUMIN   Family History  Problem Relation Age of Onset  . Hypertension Mother   . Prostate cancer Father   . Diabetes Sister        died with DM March 14, 2001, 2 more sisters with diabetes  .  Heart failure Sister   . Diabetes Brother   . Diabetes Sister   . Diabetes Sister     Past medical history, social, surgical and family history all reviewed in electronic medical record.  No pertanent information unless stated regarding to the chief complaint.   Review of Systems: No headache, visual changes, nausea, vomiting, diarrhea, constipation, dizziness, abdominal pain, skin rash, fevers, chills, night sweats, weight loss, swollen lymph nodes, body aches, joint swelling, muscle aches, chest pain, shortness of breath, mood changes.   Objective  Blood pressure (!) 150/92, pulse (!) 52, height 6\' 2"  (1.88 m), weight 262 lb (118.8 kg).   Systems examined below as of 06/16/17 General: NAD A&O x3 mood, affect normal  HEENT: Pupils equal, extraocular movements intact no nystagmus Respiratory: not short of breath at rest or with  speaking Cardiovascular: No lower extremity edema, non tender Skin: Warm dry intact with no signs of infection or rash on extremities or on axial skeleton. Abdomen: Soft nontender, no masses Neuro: Cranial nerves  intact, neurovascularly intact in all extremities with 2+ DTRs and 2+ pulses. Lymph: No lymphadenopathy appreciated today  Gait improved gait MSK: Non tender with full range of motion and good stability and symmetric strength and tone of shoulders, elbows, wrist, hips and ankles bilaterally.   Knee: Right valgus deformity noted. Large thigh to calf ratio.  Tender to palpation over medial and PF joint line. Less than previous exam ROM full in flexion and extension and lower leg rotation. instability with valgus force.  painful patellar compression. Less than previous exam Patellar glide with moderate crepitus. Patellar and quadriceps tendons unremarkable. Hamstring and quadriceps strength is normal. Contralateral knee shows replacement  After informed written and verbal consent, patient was seated on exam table. Right knee was prepped with alcohol swab and utilizing anterolateral approach, patient's right knee space was injected with15 mg/2.5 mL of Orthovisc(sodium hyaluronate) in a prefilled syringe was injected easily into the knee through a 22-gauge needle..Patient tolerated the procedure well without immediate complications.    Impression and Recommendations:     This case required medical decision making of moderate complexity.      Note: This dictation was prepared with Dragon dictation along with smaller phrase technology. Any transcriptional errors that result from this process are unintentional.

## 2017-06-23 ENCOUNTER — Ambulatory Visit (INDEPENDENT_AMBULATORY_CARE_PROVIDER_SITE_OTHER): Payer: PPO | Admitting: Family Medicine

## 2017-06-23 DIAGNOSIS — M1711 Unilateral primary osteoarthritis, right knee: Secondary | ICD-10-CM | POA: Diagnosis not present

## 2017-06-23 NOTE — Progress Notes (Signed)
[name] [address] [place], [place] [address] Phone: [phone] Subjective:    CC: Right knee pain f/u  CVE:[id number]  [name] is a 61 y.o. male coming in with complaint of right knee pain. Patient has end-stage osteophytic changes. Patient is here failing all conservative therapy for Fourth and final injection of viscous supplementation today. He is doing better. No swelling. Happy with the results of far  Past Medical History:  Diagnosis Date  . BACK PAIN   . COLONIC POLYPS, HX OF   . DEGENERATIVE JOINT DISEASE, KNEES, BILATERAL   . Depression   . DEPRESSION   . Diabetes mellitus    type II  . DIVERTICULOSIS, COLON   . DVT, lower extremity ([place])    right  . Embolus ([place]) 03/16/04   pulmonary  . Hyperlipidemia   . Hypertension   . HYPOGONADISM, MALE   . HYPOTENSION, ORTHOSTATIC   . Kidney stones   . Long term current use of anticoagulant   . Obesity   . OBESITY, MORBID   . Obstructive sleep apnea    uses cpap setting 8 to 10  . Renal insufficiency   . RENAL INSUFFICIENCY    Past Surgical History:  Procedure Laterality Date  . ankle surgury  2001 bilateral   with bone spurs and torn tendon  . EDG  03-17-07   chronic Duodenitis  . GASTRIC BYPASS    . HEEL SPUR SURGERY Bilateral 03/16/2004  . knee surgury  2000 & 03-16-02    cartilage damage  . LUMBAR LAMINECTOMY/DECOMPRESSION MICRODISCECTOMY  09/28/2012   Procedure: LUMBAR LAMINECTOMY/DECOMPRESSION MICRODISCECTOMY 2 LEVELS;  Surgeon: [name], MD;  [facility];  Service: Orthopedics;  Laterality: Left;  Decompressive laminectomy L4-L5. Microdiscectomy L5-S1  . REPLACEMENT TOTAL KNEE  2012/03/16  . TONSILLECTOMY AND ADENOIDECTOMY  age 47 or 57  . TOTAL KNEE ARTHROPLASTY  01/10/2012   Procedure: TOTAL KNEE ARTHROPLASTY;  Surgeon: [name], MD;  [facility];  Service: Orthopedics;  Laterality: Left;   Social History   Social History  . Marital status: Single    Spouse  name: N/A  . Number of children: 2  . Years of education: N/A   Occupational History  . back at work at [employer] since Jan. 2010 [place]  . Smoking status: Former Smoker    Packs/day: 0.25    Years: 5.00    Quit date: 12/06/1978  . Smokeless tobacco: Never Used  . Alcohol use Not on file     Comment: occasional  . Drug use: No  . Sexual activity: Not Currently   Other Topics Concern  . Not on file   Social History Narrative   Daily caffeine use one per day   Protein drinks    Live alone   Children; a friend and sister that helps   Brother that is around Legrand [place])   HAVE A SON AND DTR IN [place]   Allergies  Allergen Reactions  . Adhesive [Tape] Other (See Comments)    Painful, Please use "paper" tape  . Other     NO BLOOD -JEHOVAH'S WITNESS-SIGNED REFUSAL BUT WOULD TAKE ALBUMIN   Family History  Problem Relation Age of Onset  . Hypertension Mother   . Prostate cancer Father   . Diabetes Sister        died with DM 03/16/2001, 2 more sisters with diabetes  . Heart failure Sister   .  Diabetes Brother   . Diabetes Sister   . Diabetes Sister     Past medical history, social, surgical and family history all reviewed in electronic medical record.  No pertanent information unless stated regarding to the chief complaint.   Review of Systems: No headache, visual changes, nausea, vomiting, diarrhea, constipation, dizziness, abdominal pain, skin rash, fevers, chills, night sweats, weight loss, swollen lymph nodes, body aches, joint swelling, muscle aches, chest pain, shortness of breath, mood changes.    Objective  Blood pressure (!) 184/98, pulse (!) 55, weight 265 lb (120.2 kg), SpO2 97 %.   Systems examined below as of 06/23/17 General: NAD A&O x3 mood, affect normal  HEENT: Pupils equal, extraocular movements intact no nystagmus Respiratory: not short of breath at rest or with speaking Cardiovascular: No lower extremity edema,  non tender Skin: Warm dry intact with no signs of infection or rash on extremities or on axial skeleton. Abdomen: Soft nontender, no masses Neuro: Cranial nerves  intact, neurovascularly intact in all extremities with 2+ DTRs and 2+ pulses. Lymph: No lymphadenopathy appreciated today  Gait continue mild antalgic gait MSK: Non tender with full range of motion and good stability and symmetric strength and tone of shoulders, elbows, wrist, hips and ankles bilaterally.   Knee: Right valgus deformity noted. Large thigh to calf ratio.  Less tenderness than previous exam ROM full in flexion and extension and lower leg rotation. instability with valgus force.  painful patellar compression. Patellar glide with moderate crepitus. Patellar and quadriceps tendons unremarkable. Hamstring and quadriceps strength is normal. Contralateral knee shows mild arthritic changes but no pain  After informed written and verbal consent, patient was seated on exam table. Right knee was prepped with alcohol swab and utilizing anterolateral approach, patient's right knee space was injected with15 mg/2.5 mL of Orthovisc(sodium hyaluronate) in a prefilled syringe was injected easily into the knee through a 22-gauge needle..Patient tolerated the procedure well without immediate complications.   Impression and Recommendations:     This case required medical decision making of moderate complexity.      Note: This dictation was prepared with Dragon dictation along with smaller phrase technology. Any transcriptional errors that result from this process are unintentional.

## 2017-06-23 NOTE — Assessment & Plan Note (Signed)
Fourth and final given of the injection for viscous supplementation today in the right knee. Patient continue with conservative therapy. We'll follow-up with me again more on an as-needed basis as long as patient does well.

## 2017-06-23 NOTE — Patient Instructions (Signed)
Youe done I am sorry about the leak.  See me when you need me

## 2017-06-28 ENCOUNTER — Encounter: Payer: Self-pay | Admitting: Internal Medicine

## 2017-06-28 ENCOUNTER — Other Ambulatory Visit (INDEPENDENT_AMBULATORY_CARE_PROVIDER_SITE_OTHER): Payer: PPO

## 2017-06-28 ENCOUNTER — Ambulatory Visit (INDEPENDENT_AMBULATORY_CARE_PROVIDER_SITE_OTHER): Payer: PPO | Admitting: Internal Medicine

## 2017-06-28 VITALS — BP 150/98 | HR 64 | Ht 74.0 in | Wt 265.0 lb

## 2017-06-28 DIAGNOSIS — Z114 Encounter for screening for human immunodeficiency virus [HIV]: Secondary | ICD-10-CM | POA: Diagnosis not present

## 2017-06-28 DIAGNOSIS — Z0000 Encounter for general adult medical examination without abnormal findings: Secondary | ICD-10-CM | POA: Diagnosis not present

## 2017-06-28 DIAGNOSIS — I2510 Atherosclerotic heart disease of native coronary artery without angina pectoris: Secondary | ICD-10-CM | POA: Diagnosis not present

## 2017-06-28 DIAGNOSIS — E119 Type 2 diabetes mellitus without complications: Secondary | ICD-10-CM

## 2017-06-28 DIAGNOSIS — I10 Essential (primary) hypertension: Secondary | ICD-10-CM

## 2017-06-28 HISTORY — DX: Atherosclerotic heart disease of native coronary artery without angina pectoris: I25.10

## 2017-06-28 LAB — URINALYSIS, ROUTINE W REFLEX MICROSCOPIC
Bilirubin Urine: NEGATIVE
Hgb urine dipstick: NEGATIVE
Ketones, ur: NEGATIVE
LEUKOCYTES UA: NEGATIVE
Nitrite: NEGATIVE
RBC / HPF: NONE SEEN (ref 0–?)
SPECIFIC GRAVITY, URINE: 1.02 (ref 1.000–1.030)
TOTAL PROTEIN, URINE-UPE24: NEGATIVE
Urine Glucose: NEGATIVE
Urobilinogen, UA: 1 (ref 0.0–1.0)
pH: 6 (ref 5.0–8.0)

## 2017-06-28 LAB — CBC WITH DIFFERENTIAL/PLATELET
Basophils Absolute: 0 10*3/uL (ref 0.0–0.1)
Basophils Relative: 0.3 % (ref 0.0–3.0)
EOS PCT: 1.4 % (ref 0.0–5.0)
Eosinophils Absolute: 0.1 10*3/uL (ref 0.0–0.7)
HCT: 45.2 % (ref 39.0–52.0)
Hemoglobin: 13.7 g/dL (ref 13.0–17.0)
LYMPHS ABS: 1.5 10*3/uL (ref 0.7–4.0)
Lymphocytes Relative: 29.2 % (ref 12.0–46.0)
MCHC: 30.3 g/dL (ref 30.0–36.0)
MCV: 73.1 fl — AB (ref 78.0–100.0)
MONO ABS: 0.5 10*3/uL (ref 0.1–1.0)
Monocytes Relative: 8.9 % (ref 3.0–12.0)
NEUTROS ABS: 3.1 10*3/uL (ref 1.4–7.7)
NEUTROS PCT: 60.2 % (ref 43.0–77.0)
PLATELETS: 168 10*3/uL (ref 150.0–400.0)
RBC: 6.19 Mil/uL — AB (ref 4.22–5.81)
RDW: 15.7 % — AB (ref 11.5–15.5)
WBC: 5.1 10*3/uL (ref 4.0–10.5)

## 2017-06-28 LAB — HEPATIC FUNCTION PANEL
ALBUMIN: 4.4 g/dL (ref 3.5–5.2)
ALK PHOS: 80 U/L (ref 39–117)
ALT: 21 U/L (ref 0–53)
AST: 19 U/L (ref 0–37)
BILIRUBIN DIRECT: 0.1 mg/dL (ref 0.0–0.3)
Total Bilirubin: 0.7 mg/dL (ref 0.2–1.2)
Total Protein: 6.6 g/dL (ref 6.0–8.3)

## 2017-06-28 LAB — LIPID PANEL
CHOL/HDL RATIO: 3
Cholesterol: 151 mg/dL (ref 0–200)
HDL: 49.5 mg/dL (ref >39.00)
LDL CALC: 77 mg/dL (ref 0–99)
NONHDL: 101.32
TRIGLYCERIDES: 120 mg/dL (ref 0.0–149.0)
VLDL: 24 mg/dL (ref 0.0–40.0)

## 2017-06-28 LAB — BASIC METABOLIC PANEL
BUN: 21 mg/dL (ref 6–23)
CO2: 32 meq/L (ref 19–32)
Calcium: 9.3 mg/dL (ref 8.4–10.5)
Chloride: 108 mEq/L (ref 96–112)
Creatinine, Ser: 0.92 mg/dL (ref 0.40–1.50)
GFR: 106.68 mL/min (ref >60.00)
GLUCOSE: 97 mg/dL (ref 70–99)
POTASSIUM: 4.9 meq/L (ref 3.5–5.1)
SODIUM: 144 meq/L (ref 135–145)

## 2017-06-28 LAB — PSA: PSA: 0.75 ng/mL (ref 0.10–4.00)

## 2017-06-28 LAB — MICROALBUMIN / CREATININE URINE RATIO
CREATININE, U: 158.2 mg/dL
MICROALB UR: 3.2 mg/dL — AB (ref 0.0–1.9)
MICROALB/CREAT RATIO: 2 mg/g (ref 0.0–30.0)

## 2017-06-28 LAB — TSH: TSH: 1.29 u[IU]/mL (ref 0.35–4.50)

## 2017-06-28 LAB — HEMOGLOBIN A1C: Hgb A1c MFr Bld: 6.5 % (ref 4.6–6.5)

## 2017-06-28 NOTE — Progress Notes (Signed)
Subjective:    Patient ID: [name], male    DOB: 03-21-1956, 61 y.o.   MRN: [id number]  HPI  Here for wellness and f/u;  Overall doing ok;  Pt denies Chest pain, worsening SOB, DOE, wheezing, orthopnea, PND, worsening LE edema, palpitations, dizziness or syncope.  Pt denies neurological change such as new headache, facial or extremity weakness.  Pt denies polydipsia, polyuria, or low sugar symptoms. Pt states overall good compliance with treatment and medications, good tolerability, and has been trying to follow appropriate diet.  Pt denies worsening depressive symptoms, suicidal ideation or panic. No fever, night sweats, wt loss, loss of appetite, or other constitutional symptoms.  Pt states good ability with ADL's, has low fall risk, home safety reviewed and adequate, no other significant changes in hearing or vision, and not active with exercise, butr plans to start more walking, but difficult as right knee has chronic pain, and plans to f/u with orthopedic soon. Wt Readings from Last 3 Encounters:  06/28/17 265 lb (120.2 kg)  06/23/17 265 lb (120.2 kg)  06/16/17 262 lb (118.8 kg)  [facility] optho exam next month.   Of note, last cardiology f/u with Dr Meda Coffee was 2016, due for f/u post abnormal cardiac ct Past Medical History:  Diagnosis Date  . BACK PAIN   . COLONIC POLYPS, HX OF   . DEGENERATIVE JOINT DISEASE, KNEES, BILATERAL   . Depression   . DEPRESSION   . Diabetes mellitus    type II  . DIVERTICULOSIS, COLON   . DVT, lower extremity ([place])    right  . Embolus ([place]) 2005   pulmonary  . Hyperlipidemia   . Hypertension   . HYPOGONADISM, MALE   . HYPOTENSION, ORTHOSTATIC   . Kidney stones   . Long term current use of anticoagulant   . Obesity   . OBESITY, MORBID   . Obstructive sleep apnea    uses cpap setting 8 to 10  . Renal insufficiency   . RENAL INSUFFICIENCY    Past Surgical History:  Procedure Laterality Date  . ankle surgury  2001 bilateral   with bone spurs  and torn tendon  . EDG  2008   chronic Duodenitis  . GASTRIC BYPASS    . HEEL SPUR SURGERY Bilateral 2005  . knee surgury  2000 & 2003    cartilage damage  . LUMBAR LAMINECTOMY/DECOMPRESSION MICRODISCECTOMY  09/28/2012   Procedure: LUMBAR LAMINECTOMY/DECOMPRESSION MICRODISCECTOMY 2 LEVELS;  Surgeon: [name], MD;  Location: [facility];  Service: Orthopedics;  Laterality: Left;  Decompressive laminectomy L4-L5. Microdiscectomy L5-S1  . REPLACEMENT TOTAL KNEE  2013  . TONSILLECTOMY AND ADENOIDECTOMY  age 52 or 29  . TOTAL KNEE ARTHROPLASTY  01/10/2012   Procedure: TOTAL KNEE ARTHROPLASTY;  Surgeon: [name], MD;  Location: [facility];  Service: Orthopedics;  Laterality: Left;    reports that he quit smoking about 38 years ago. He has a 1.25 pack-year smoking history. He has never used smokeless tobacco. He reports that he does not use drugs. His alcohol history is not on file. family history includes Diabetes in his brother, sister, sister, and sister; Heart failure in his sister; Hypertension in his mother; Prostate cancer in his father. Allergies  Allergen Reactions  . Adhesive [Tape] Other (See Comments)    Painful, Please use "paper" tape  . Other     NO BLOOD -JEHOVAH'S WITNESS-SIGNED REFUSAL BUT WOULD TAKE ALBUMIN   Current Outpatient Prescriptions on File Prior to Visit  Medication Sig Dispense Refill  . Artificial Tear Ointment (ARTIFICIAL TEARS) ointment Place 1 drop into both eyes as needed (for dry eyes).    Marland Kitchen aspirin EC 81 MG tablet Take 81 mg by mouth daily.    Marland Kitchen atorvastatin (LIPITOR) 80 MG tablet TAKE 1 TABLET (80 MG TOTAL) BY MOUTH DAILY. (Patient taking differently: TAKE 1 TABLET (80 MG TOTAL) BY MOUTH every evening) 90 tablet 3  . Calcium Carbonate (CALCI-CHEW PO) Take 1 tablet by mouth daily.     . Cyanocobalamin (VITAMIN B-12 SL) Place 1 tablet under the tongue daily.    Marland Kitchen escitalopram (LEXAPRO) 10 MG tablet Take 1 tablet (10 mg total) by mouth daily. 30 tablet 0    . Ferrous Sulfate (IRON) 90 (18 Fe) MG TABS Take 18 mg by mouth daily.    . fluticasone (FLONASE) 50 MCG/ACT nasal spray Place 2 sprays into both nostrils daily. 16 g 2  . lisinopril (PRINIVIL,ZESTRIL) 5 MG tablet TAKE 1 TABLET (5 MG TOTAL) BY MOUTH DAILY. 90 tablet 1  . LYRICA 50 MG capsule TAKE ONE CAPSULE BY MOUTH TWICE A DAY 60 capsule 5  . metFORMIN (GLUCOPHAGE) 500 MG tablet Take 1 tablet (500 mg total) by mouth daily with breakfast. 180 tablet 3  . metFORMIN (GLUCOPHAGE) 500 MG tablet TAKE 1 TABLET (500 MG TOTAL) BY MOUTH 2 (TWO) TIMES DAILY WITH A MEAL. 180 tablet 3  . Multiple Vitamin (MULTIVITAMIN WITH MINERALS) TABS tablet Take 1 tablet by mouth daily.    . nitroGLYCERIN (NITROSTAT) 0.4 MG SL tablet Place 1 tablet (0.4 mg total) under the tongue every 5 (five) minutes as needed for chest pain. 90 tablet 3  . polyethylene glycol (MIRALAX / GLYCOLAX) packet Take 17 g by mouth daily as needed for mild constipation.    . senna-docusate (SENNA PLUS) 8.6-50 MG tablet Take 1 tablet by mouth daily as needed for mild constipation. Reported on 03/22/2016    . vitamin B-12 (CYANOCOBALAMIN) 1000 MCG tablet Take by mouth.    . warfarin (COUMADIN) 10 MG tablet TAKE AS DIRECTED BY ANTICOAGULATION CLINIC. 90 tablet 3   No current facility-administered medications on file prior to visit.    Review of Systems Constitutional: Negative for other unusual diaphoresis, sweats, appetite or weight changes HENT: Negative for other worsening hearing loss, ear pain, facial swelling, mouth sores or neck stiffness.   Eyes: Negative for other worsening pain, redness or other visual disturbance.  Respiratory: Negative for other stridor or swelling Cardiovascular: Negative for other palpitations or other chest pain  Gastrointestinal: Negative for worsening diarrhea or loose stools, blood in stool, distention or other pain Genitourinary: Negative for hematuria, flank pain or other change in urine volume.   Musculoskeletal: Negative for myalgias or other joint swelling.  Skin: Negative for other color change, or other wound or worsening drainage.  Neurological: Negative for other syncope or numbness. Hematological: Negative for other adenopathy or swelling Psychiatric/Behavioral: Negative for hallucinations, other worsening agitation, SI, self-injury, or new decreased concentration All other system neg per pt    Objective:   Physical Exam BP (!) 150/98   Pulse 64   Ht 6\' 2"  (1.88 m)   Wt 265 lb (120.2 kg)   SpO2 99%   BMI 34.02 kg/m  VS noted,  Constitutional: Pt is oriented to person, place, and time. Appears well-developed and well-nourished, in no significant distress and comfortable Head: Normocephalic and atraumatic  Eyes: Conjunctivae and EOM are normal. Pupils are equal, round, and reactive to light  Right Ear: External ear normal without discharge Left Ear: External ear normal without discharge Nose: Nose without discharge or deformity Mouth/Throat: Oropharynx is without other ulcerations and moist  Neck: Normal range of motion. Neck supple. No JVD present. No tracheal deviation present or significant neck LA or mass Cardiovascular: Normal rate, regular rhythm, normal heart sounds and intact distal pulses.   Pulmonary/Chest: WOB normal and breath sounds without rales or wheezing  Abdominal: Soft. Bowel sounds are normal. NT. No HSM  Musculoskeletal: Normal range of motion. Exhibits no edema Lymphadenopathy: Has no other cervical adenopathy.  Neurological: Pt is alert and oriented to person, place, and time. Pt has normal reflexes. No cranial nerve deficit. Motor grossly intact, Gait intact Skin: Skin is warm and dry. No rash noted or new ulcerations Psychiatric:  Has normal mood and affect. Behavior is normal without agitation No other exam findings    Assessment & Plan:

## 2017-06-28 NOTE — Patient Instructions (Signed)
Please continue all other medications as before, and refills have been done if requested.  Please have the pharmacy call with any other refills you may need.  Please continue your efforts at being more active, low cholesterol diet, and weight control.  You are otherwise up to date with prevention measures today.  Please keep your appointments with your specialists as you may have planned  You will be contacted regarding the referral for: Dr Nelson/cardiology  Please go to the LAB in the Basement (turn left off the elevator) for the tests to be done today  You will be contacted by phone if any changes need to be made immediately.  Otherwise, you will receive a letter about your results with an explanation, but please check with MyChart first.  Please return in 6 months, or sooner if needed, with Lab testing done 3-5 days before

## 2017-06-29 LAB — HIV ANTIBODY (ROUTINE TESTING W REFLEX): HIV: NONREACTIVE

## 2017-06-29 NOTE — Assessment & Plan Note (Signed)
D/w pt, I think needs increased antiHTN med but he declines, o/w stable overall by history and exam, recent data reviewed with pt, and pt to continue medical treatment as before,  to f/u any worsening symptoms or concerns BP Readings from Last 3 Encounters:  06/28/17 (!) 150/98  06/23/17 (!) 184/98  06/16/17 (!) 150/92

## 2017-06-29 NOTE — Assessment & Plan Note (Signed)
stable overall by history and exam, recent data reviewed with pt, and pt to continue medical treatment as before,  to f/u any worsening symptoms or concerns Lab Results  Component Value Date   HGBA1C 6.5 06/28/2017   

## 2017-06-29 NOTE — Assessment & Plan Note (Signed)

## 2017-06-29 NOTE — Assessment & Plan Note (Signed)
With 2016 abnormal cardiac CT , for referral back to cardiology as is due for f/u

## 2017-07-11 ENCOUNTER — Encounter: Payer: Self-pay | Admitting: Physician Assistant

## 2017-07-13 ENCOUNTER — Ambulatory Visit (INDEPENDENT_AMBULATORY_CARE_PROVIDER_SITE_OTHER): Payer: PPO | Admitting: General Practice

## 2017-07-13 DIAGNOSIS — Z5181 Encounter for therapeutic drug level monitoring: Secondary | ICD-10-CM | POA: Diagnosis not present

## 2017-07-13 LAB — POCT INR: INR: 1.7

## 2017-07-13 NOTE — Progress Notes (Signed)
I have reviewed and agree with the plan. 

## 2017-07-13 NOTE — Patient Instructions (Signed)
Pre visit review using our clinic review tool, if applicable. No additional management support is needed unless otherwise documented below in the visit note. 

## 2017-07-17 ENCOUNTER — Other Ambulatory Visit: Payer: Self-pay | Admitting: Internal Medicine

## 2017-07-28 ENCOUNTER — Ambulatory Visit (INDEPENDENT_AMBULATORY_CARE_PROVIDER_SITE_OTHER): Payer: PPO | Admitting: Physician Assistant

## 2017-07-28 ENCOUNTER — Encounter: Payer: Self-pay | Admitting: Physician Assistant

## 2017-07-28 VITALS — BP 154/106 | HR 50 | Ht 74.0 in | Wt 264.4 lb

## 2017-07-28 DIAGNOSIS — I2510 Atherosclerotic heart disease of native coronary artery without angina pectoris: Secondary | ICD-10-CM

## 2017-07-28 DIAGNOSIS — E785 Hyperlipidemia, unspecified: Secondary | ICD-10-CM | POA: Diagnosis not present

## 2017-07-28 DIAGNOSIS — I4510 Unspecified right bundle-branch block: Secondary | ICD-10-CM | POA: Diagnosis not present

## 2017-07-28 DIAGNOSIS — I10 Essential (primary) hypertension: Secondary | ICD-10-CM

## 2017-07-28 HISTORY — DX: Unspecified right bundle-branch block: I45.10

## 2017-07-28 MED ORDER — LISINOPRIL 20 MG PO TABS: 20.0000 mg | ORAL_TABLET | Freq: Every day | ORAL | 3 refills | Status: DC

## 2017-07-28 NOTE — Progress Notes (Signed)
Cardiology Office Note    Date:  07/28/2017  ID:  [name], [name] 1956/01/10, MRN [id number] PCP:  Biagio Borg, MD  Cardiologist:  Dr. Meda Coffee   Chief Complaint: overdue follow-up  History of Present Illness:  [name] is a 61 y.o. male with history of CAD by cardiac CT, DM, HTN with h/o hypotension after losing weight, obesity, HLD, recurrent DVTs, recurrent PEs on chronic anticoagulation, prior renal insufficiency who presents back for overdue follow-up of CAD. In 2016 he was evaluated for chest pain. He had ruled out for MI and underwent nuclear stress test ordered by primary care that showed possible old MI in the inferolateral wall, called intermediate risk. He saw Dr. Meda Coffee and underwent coronary CTA 07/2015 which showed calcium score of 1186 (96th percentile), normal coronary origin, diffuse moderate CAD, with a long segment of calcified plaque in the proximal to mid LAD associated with 50-69% stenosis, no obstructive plaque seen. Last labs 06/2017 showed A1C 6.5, TSH wnl, Hgb 13.7 (microcytic chronically), K 4.9, Cr 0.92, normal LFTs, LDL of 77 (prior values 55-68).  He returns for 2 year follow-up feeling well. He is accompanied by his wife [name]. He's not had any cardiac symptoms at all including CP, SOB, diaphoresis, nausea, palpitations or syncope. He was working out recently 2-3x a week at a gym before his knee started giving him trouble - has this from time to time. No LEE. He does not wear CPAP as instructed. He's not really sure who follows this but thinks his PCP was previously the one who helped facilitate this. He ate [employer] fried chicken before coming to his visit today.   Past Medical History:  Diagnosis Date  . BACK PAIN   . CAD in native artery    a. moderate by cardiac CT 2016.  Marland Kitchen COLONIC POLYPS, HX OF   . DEGENERATIVE JOINT DISEASE, KNEES, BILATERAL   . Depression   . Diabetes mellitus    type II  . DIVERTICULOSIS, COLON   . DVT, lower extremity  ([place])    right  . Hyperlipidemia   . Hypertension   . HYPOGONADISM, MALE   . HYPOTENSION, ORTHOSTATIC   . Kidney stones   . Long term current use of anticoagulant   . Obesity   . Obstructive sleep apnea    uses cpap setting 8 to 10  . Pulmonary emboli ([place])   . Renal insufficiency    a. prior h/o, does not appear chronic.    Past Surgical History:  Procedure Laterality Date  . ankle surgury  2001 bilateral   with bone spurs and torn tendon  . EDG  2008   chronic Duodenitis  . GASTRIC BYPASS    . HEEL SPUR SURGERY Bilateral 2005  . knee surgury  2000 & 2003    cartilage damage  . LUMBAR LAMINECTOMY/DECOMPRESSION MICRODISCECTOMY  09/28/2012   Procedure: LUMBAR LAMINECTOMY/DECOMPRESSION MICRODISCECTOMY 2 LEVELS;  Surgeon: [name], MD;  Location: [facility];  Service: Orthopedics;  Laterality: Left;  Decompressive laminectomy L4-L5. Microdiscectomy L5-S1  . REPLACEMENT TOTAL KNEE  2013  . TONSILLECTOMY AND ADENOIDECTOMY  age 29 or 41  . TOTAL KNEE ARTHROPLASTY  01/10/2012   Procedure: TOTAL KNEE ARTHROPLASTY;  Surgeon: [name], MD;  Location: [facility];  Service: Orthopedics;  Laterality: Left;    Current Medications: Current Meds  Medication Sig  . Artificial Tear Ointment (ARTIFICIAL TEARS) ointment Place 1 drop into both eyes as needed (for dry eyes).  Marland Kitchen  aspirin EC 81 MG tablet Take 81 mg by mouth daily.  Marland Kitchen atorvastatin (LIPITOR) 80 MG tablet TAKE 1 TABLET (80 MG TOTAL) BY MOUTH DAILY. (Patient taking differently: TAKE 1 TABLET (80 MG TOTAL) BY MOUTH every evening)  . Calcium Carbonate (CALCI-CHEW PO) Take 1 tablet by mouth daily.   . Cyanocobalamin (VITAMIN B-12 SL) Place 1 tablet under the tongue daily.  Marland Kitchen escitalopram (LEXAPRO) 10 MG tablet Take 1 tablet (10 mg total) by mouth daily.  . Ferrous Sulfate (IRON) 90 (18 Fe) MG TABS Take 18 mg by mouth daily.  . fluticasone (FLONASE) 50 MCG/ACT nasal spray Place 2 sprays into both nostrils daily.  Marland Kitchen lisinopril  (PRINIVIL,ZESTRIL) 5 MG tablet TAKE 1 TABLET (5 MG TOTAL) BY MOUTH DAILY.  Marland Kitchen LYRICA 50 MG capsule TAKE ONE CAPSULE BY MOUTH TWICE A DAY  . metFORMIN (GLUCOPHAGE) 500 MG tablet Take 1 tablet (500 mg total) by mouth daily with breakfast.  . Multiple Vitamin (MULTIVITAMIN WITH MINERALS) TABS tablet Take 1 tablet by mouth daily.  . nitroGLYCERIN (NITROSTAT) 0.4 MG SL tablet Place 1 tablet (0.4 mg total) under the tongue every 5 (five) minutes as needed for chest pain.  . polyethylene glycol (MIRALAX / GLYCOLAX) packet Take 17 g by mouth daily as needed for mild constipation.  . senna-docusate (SENNA PLUS) 8.6-50 MG tablet Take 1 tablet by mouth daily as needed for mild constipation. Reported on 03/22/2016  . warfarin (COUMADIN) 10 MG tablet TAKE AS DIRECTED BY ANTICOAGULATION CLINIC.  . [DISCONTINUED] metFORMIN (GLUCOPHAGE) 500 MG tablet TAKE 1 TABLET (500 MG TOTAL) BY MOUTH 2 (TWO) TIMES DAILY WITH A MEAL.  . [DISCONTINUED] vitamin B-12 (CYANOCOBALAMIN) 1000 MCG tablet Take by mouth.     Allergies:   Adhesive [tape] and Other   Social History   Social History  . Marital status: Single    Spouse name: N/A  . Number of children: 2  . Years of education: N/A   Occupational History  . back at work at [employer] since Jan. 2010 [employer]  . Smoking status: Former Smoker    Packs/day: 0.25    Years: 5.00    Quit date: 12/06/1978  . Smokeless tobacco: Never Used  . Alcohol use None     Comment: occasional  . Drug use: No  . Sexual activity: Not Currently   Other Topics Concern  . None   Social History Narrative   Daily caffeine use one per day   Protein drinks    Live alone   Children; a friend and sister that helps   Brother that is around [place])   HAVE A SON AND DTR IN [place]     Family History:  Family History  Problem Relation Age of Onset  . Hypertension Mother   . Prostate cancer Father   . Diabetes Sister        died with  DM 2001-03-20, 2 more sisters with diabetes  . Heart failure Sister   . Diabetes Brother   . Diabetes Sister   . Diabetes Sister     ROS:   Please see the history of present illness.  All other systems are reviewed and otherwise negative.    PHYSICAL EXAM:   VS:  BP (!) 154/106   Pulse (!) 50   Ht 6\' 2"  (1.88 m)   Wt 264 lb 6.4 oz (119.9 kg)   SpO2 96%   BMI 33.95 kg/m   BMI: Body mass index  is 33.95 kg/m. GEN: Well nourished, well developed obese built AAM in no acute distress  HEENT: normocephalic, atraumatic Neck: no JVD, carotid bruits, or masses Cardiac: RRR; no murmurs, rubs, or gallops, no edema  Respiratory:  clear to auscultation bilaterally, normal work of breathing GI: soft, nontender, nondistended, + BS MS: no deformity or atrophy  Skin: warm and dry, no rash Neuro:  Alert and Oriented x 3, Strength and sensation are intact, follows commands Psych: euthymic mood, full affect  Wt Readings from Last 3 Encounters:  07/28/17 264 lb 6.4 oz (119.9 kg)  06/28/17 265 lb (120.2 kg)  06/23/17 265 lb (120.2 kg)      Studies/Labs Reviewed:   EKG:  EKG was ordered today and personally reviewed by me and demonstrates sinus bradycardia 50bpm, RBBB, no acute ST-T changes  Recent Labs: 06/28/2017: ALT 21; BUN 21; Creatinine, Ser 0.92; Hemoglobin 13.7; Platelets 168.0; Potassium 4.9; Sodium 144; TSH 1.29   Lipid Panel    Component Value Date/Time   CHOL 151 06/28/2017 1144   TRIG 120.0 06/28/2017 1144   HDL 49.50 06/28/2017 1144   CHOLHDL 3 06/28/2017 1144   VLDL 24.0 06/28/2017 1144   LDLCALC 77 06/28/2017 1144   LDLDIRECT 121.2 05/25/2011 0827    Additional studies/ records that were reviewed today include: Summarized above    ASSESSMENT & PLAN:   1. CAD - noted on cardiac CT. No recent anginal symptoms. Continue aggressive risk factor modification. Recent blood pressures by primary care have all been elevated. PCP suggested med titration but the patient elected  to defer. I reinforced the importance of strict BP control to the patient today. Given his CAD, his goal would be closer to 120/80. I do not think increasing lisinopril to 10mg  daily will get him to goal. Additionally, ACEI can be less effective as monotherapy in African-American patients. I would recommend increasing to 20mg  daily with recheck BMET/BP check in 1 week with HTN clinic or APP. If BP remains elevated at that time, would consider addition of low dose chlorthalidone or hydrochlorothiazide. Note he has prior h/o orthostatic hypotension but he reports this was in the setting of when he was on several BP medications and had lost a bunch of weight. He follows BP at home and will monitor for hypotension. He has not seen any low readings recently. Discussed importance of healthy diet (i.e. Less fried foods, substituting healthy carbs for starchy carbs, decreasing sugar). Also asked him to touch base with PCP about getting back on track with OSA treatment as this is also likely elevating his BP.  2. Essential HTN - see above. Follow BP with increased lisinopril. 3. Hyperlipidemia - continue high dose statin. Lipids followed by PCP. Would aim for goal LDL around 70 or less. 4. RBBB - prior tracing in 2017 showed NISVCD, now progressed to RBBB. No symptoms of bradycardia but baseline HR is around 50. Monitor for now.  Disposition: F/u in 1 week for BP visit with either APP or HTN clinic, and f/u 1 year with Dr. Meda Coffee. Angina symptoms reviewed.  Medication Adjustments/Labs and Tests Ordered: Current medicines are reviewed at length with the patient today.  Concerns regarding medicines are outlined above. Medication changes, Labs and Tests ordered today are summarized above and listed in the Patient Instructions accessible in Encounters.   Signed, Yassine Pitter, PA-C  07/28/2017 1:53 PM    [facility] [place], [place], [place]  [address] Phone: [phone]; Fax: [phone]  

## 2017-07-28 NOTE — Patient Instructions (Addendum)
Medication Instructions:  Your physician has recommended you make the following change in your medication:  1.  INCREASE the Lisinopril to 20 mg daily  Labwork: 1 WEEK:  BMET (AT [place] [address])  Testing/Procedures: None ordered  Follow-Up: Your physician recommends that you schedule a follow-up appointment in: [place]  Your physician wants you to follow-up in: [place] DR. Johann Capers will receive a reminder letter in the mail two months in advance. If you don't receive a letter, please call our office to schedule the follow-up appointment.    Any Other Special Instructions Will Be Listed Below (If Applicable).    Heart-Healthy Eating Plan Heart-healthy meal planning includes:  Limiting unhealthy fats.  Increasing healthy fats.  Making other small dietary changes.  You may need to talk with your doctor or a diet specialist (dietitian) to create an eating plan that is right for you. What types of fat should I choose?  Choose healthy fats. These include olive oil and canola oil, flaxseeds, walnuts, almonds, and seeds.  Eat more omega-3 fats. These include salmon, mackerel, sardines, tuna, flaxseed oil, and ground flaxseeds. Try to eat fish at least twice each week.  Limit saturated fats. ? Saturated fats are often found in animal products, such as meats, butter, and cream. ? Plant sources of saturated fats include palm oil, palm kernel oil, and coconut oil.  Avoid foods with partially hydrogenated oils in them. These include stick margarine, some tub margarines, cookies, crackers, and other baked goods. These contain trans fats. What general guidelines do I need to follow?  Check food labels carefully. Identify foods with trans fats or high amounts of saturated fat.  Fill one half of your plate with vegetables and green salads. Eat 4-5 servings of vegetables per day. A serving of vegetables is: ? 1  cup of raw leafy vegetables. ?  cup of raw or cooked cut-up vegetables. ?  cup of vegetable juice.  Fill one fourth of your plate with whole grains. Look for the word "whole" as the first word in the ingredient list.  Fill one fourth of your plate with lean protein foods.  Eat 4-5 servings of fruit per day. A serving of fruit is: ? One medium whole fruit. ?  cup of dried fruit. ?  cup of fresh, frozen, or canned fruit. ?  cup of 100% fruit juice.  Eat more foods that contain soluble fiber. These include apples, broccoli, carrots, beans, peas, and barley. Try to get 20-30 g of fiber per day.  Eat more home-cooked food. Eat less restaurant, buffet, and fast food.  Limit or avoid alcohol.  Limit foods high in starch and sugar.  Avoid fried foods.  Avoid frying your food. Try baking, boiling, grilling, or broiling it instead. You can also reduce fat by: ? Removing the skin from poultry. ? Removing all visible fats from meats. ? Skimming the fat off of stews, soups, and gravies before serving them. ? Steaming vegetables in water or broth.  Lose weight if you are overweight.  Eat 4-5 servings of nuts, legumes, and seeds per week: ? One serving of dried beans or legumes equals  cup after being cooked. ? One serving of nuts equals 1 ounces. ? One serving of seeds equals  ounce or one tablespoon.  You may need to keep track of how much salt or sodium you eat. This is especially true if you  have high blood pressure. Talk with your doctor or dietitian to get more information. What foods can I eat? Grains Breads, including [place], white, pita, wheat, raisin, rye, oatmeal, and [place]. Tortillas that are neither fried nor made with lard or trans fat. Low-fat rolls, including hotdog and hamburger buns and English muffins. Biscuits. Muffins. Waffles. Pancakes. Light popcorn. Whole-grain cereals. Flatbread. Melba toast. Pretzels. Breadsticks. Rusks. Low-fat snacks. Low-fat crackers,  including oyster, saltine, matzo, graham, animal, and rye. Rice and pasta, including brown rice and pastas that are made with whole wheat. Vegetables All vegetables. Fruits All fruits, but limit coconut. Meats and Other Protein Sources Lean, well-trimmed beef, veal, pork, and lamb. Chicken and [place] without skin. All fish and shellfish. Wild duck, rabbit, pheasant, and venison. Egg whites or low-cholesterol egg substitutes. Dried beans, peas, lentils, and tofu. Seeds and most nuts. Dairy Low-fat or nonfat cheeses, including ricotta, string, and mozzarella. Skim or 1% milk that is liquid, powdered, or evaporated. Buttermilk that is made with low-fat milk. Nonfat or low-fat yogurt. Beverages Mineral water. Diet carbonated beverages. Sweets and Desserts Sherbets and fruit ices. Honey, jam, marmalade, jelly, and syrups. Meringues and gelatins. Pure sugar candy, such as hard candy, jelly beans, gumdrops, mints, marshmallows, and small amounts of dark chocolate. [employer] Eat all sweets and desserts in moderation. Fats and Oils Nonhydrogenated (trans-free) margarines. Vegetable oils, including soybean, sesame, sunflower, olive, peanut, safflower, corn, canola, and cottonseed. Salad dressings or mayonnaise made with a vegetable oil. Limit added fats and oils that you use for cooking, baking, salads, and as spreads. Other Cocoa powder. Coffee and tea. All seasonings and condiments. The items listed above may not be a complete list of recommended foods or beverages. Contact your dietitian for more options. What foods are not recommended? Grains Breads that are made with saturated or trans fats, oils, or whole milk. Croissants. Butter rolls. Cheese breads. Sweet rolls. Donuts. Buttered popcorn. Chow mein noodles. High-fat crackers, such as cheese or butter crackers. Meats and Other Protein Sources Fatty meats, such as hotdogs, short ribs, sausage, spareribs, bacon, rib eye roast or steak, and  mutton. High-fat deli meats, such as salami and bologna. Caviar. Domestic duck and goose. Organ meats, such as kidney, liver, sweetbreads, and heart. Dairy Cream, sour cream, cream cheese, and creamed cottage cheese. Whole-milk cheeses, including blue (bleu), Monte[place] Jack, [place], [place], American, [place], Swiss, cheddar, Ali Molina, and [place]. Whole or 2% milk that is liquid, evaporated, or condensed. Whole buttermilk. Cream sauce or high-fat cheese sauce. Yogurt that is made from whole milk. Beverages Regular sodas and juice drinks with added sugar. Sweets and Desserts Frosting. Pudding. Cookies. Cakes other than angel food cake. Candy that has milk chocolate or white chocolate, hydrogenated fat, butter, coconut, or unknown ingredients. Buttered syrups. Full-fat ice cream or ice cream drinks. Fats and Oils Gravy that has suet, meat fat, or shortening. Cocoa butter, hydrogenated oils, palm oil, coconut oil, palm kernel oil. These can often be found in baked products, candy, fried foods, nondairy creamers, and whipped toppings. Solid fats and shortenings, including bacon fat, salt pork, lard, and butter. Nondairy cream substitutes, such as coffee creamers and sour cream substitutes. Salad dressings that are made of unknown oils, cheese, or sour cream. The items listed above may not be a complete list of foods and beverages to avoid. Contact your dietitian for more information. This information is not intended to replace advice given to you by your health care provider. Make sure you discuss any questions you  have with your health care provider. Document Released: 05/23/2012 Document Revised: 04/29/2016 Document Reviewed: 05/16/2014 Elsevier Interactive Patient Education  [employer].  If you need a refill on your cardiac medications before your next appointment, please call your pharmacy.

## 2017-07-31 ENCOUNTER — Other Ambulatory Visit: Payer: Self-pay | Admitting: Internal Medicine

## 2017-08-03 ENCOUNTER — Other Ambulatory Visit: Payer: Self-pay | Admitting: Internal Medicine

## 2017-08-04 ENCOUNTER — Ambulatory Visit (INDEPENDENT_AMBULATORY_CARE_PROVIDER_SITE_OTHER): Payer: PPO | Admitting: Pharmacist

## 2017-08-04 VITALS — BP 162/88 | HR 51

## 2017-08-04 DIAGNOSIS — I10 Essential (primary) hypertension: Secondary | ICD-10-CM

## 2017-08-04 MED ORDER — AMLODIPINE BESYLATE 5 MG PO TABS: 5.0000 mg | ORAL_TABLET | Freq: Every day | ORAL | 11 refills | Status: DC

## 2017-08-04 NOTE — Patient Instructions (Addendum)
It was nice to meet you today  Start taking amlodipine 5mg  daily  Continue taking lisinopril 20mg  daily  Look for [name] salt substitute to use on your food  Check your blood pressure at home and record your readings  Follow up in clinic in 2-3 weeks

## 2017-08-04 NOTE — Progress Notes (Signed)
Patient ID: [name]                 DOB: 14-Jun-1956                      MRN: [id number]     HPI: [name] is a 61 y.o. male patient of Dr Meda Coffee referred by Melina Copa, PA to HTN clinic. PMH is significant for CAD by cardiac CT, elevated calcium score of 1186 (96th percentile) DM, HTN with hx of hypotension after losing weight, obesity, HLD, recurrent DVTs and PEs on chronic anticoagulation, and prior renal insufficiency. At last visit 1 week ago, BP was elevated to 154/106 and lisinopril was increased from 5mg  to 20mg  daily. Pt presents today for follow up and further management of HTN.  Pt presents to clinic today in good spirits, accompanied by his wife. He reports feeling well on his increased dose of lisinopril. He denies dizziness, blurred vision, headache, or falls. He took his lisinopril ~5 hours ago. He does not drink any caffeinated beverages, although he does add salt to his food frequently. He stays active by walking and going to the gym a few days a week.  Current HTN meds: lisinopril 20mg  daily BP goal: <130/82mmHg  Family History: Mother with hx of HTN, 3 sisters and a brother with DM  Social History: Former smoker 1/4 PPD for 5 years, quit in 1980. Occasional alcohol use, denies illicit drug use.  Diet: Drinks flavored water, does not have caffeine. Likes protein drinks, chicken, fish, greens and fruit. Does add salt to food  Exercise: Treadmill and bicycle at the gym, occasional free weights. Goes a few times a week and walks as well.  Home BP readings: Has not checked his BP at home regularly, recalls a reading of 110/91  Wt Readings from Last 3 Encounters:  07/28/17 264 lb 6.4 oz (119.9 kg)  06/28/17 265 lb (120.2 kg)  06/23/17 265 lb (120.2 kg)   BP Readings from Last 3 Encounters:  07/28/17 (!) 154/106  06/28/17 (!) 150/98  06/23/17 (!) 184/98   Pulse Readings from Last 3 Encounters:  07/28/17 (!) 50  06/28/17 64  06/23/17 (!) 55    Renal  function: CrCl cannot be calculated (Patient's most recent lab result is older than the maximum 21 days allowed.).  Past Medical History:  Diagnosis Date  . BACK PAIN   . CAD in native artery    a. moderate by cardiac CT 2016.  Marland Kitchen COLONIC POLYPS, HX OF   . DEGENERATIVE JOINT DISEASE, KNEES, BILATERAL   . Depression   . Diabetes mellitus    type II  . DIVERTICULOSIS, COLON   . DVT, lower extremity ([place])    right  . Hyperlipidemia   . Hypertension   . HYPOGONADISM, MALE   . HYPOTENSION, ORTHOSTATIC   . Kidney stones   . Long term current use of anticoagulant   . Obesity   . Obstructive sleep apnea    uses cpap setting 8 to 10  . Pulmonary emboli ([place])   . Renal insufficiency    a. prior h/o, does not appear chronic.    Current Outpatient Prescriptions on File Prior to Visit  Medication Sig Dispense Refill  . Artificial Tear Ointment (ARTIFICIAL TEARS) ointment Place 1 drop into both eyes as needed (for dry eyes).    Marland Kitchen aspirin EC 81 MG tablet Take 81 mg by mouth daily.    Marland Kitchen atorvastatin (LIPITOR) 80 MG  tablet TAKE 1 TABLET (80 MG TOTAL) BY MOUTH DAILY. (Patient taking differently: TAKE 1 TABLET (80 MG TOTAL) BY MOUTH every evening) 90 tablet 3  . Calcium Carbonate (CALCI-CHEW PO) Take 1 tablet by mouth daily.     . Cyanocobalamin (VITAMIN B-12 SL) Place 1 tablet under the tongue daily.    Marland Kitchen escitalopram (LEXAPRO) 10 MG tablet Take 1 tablet (10 mg total) by mouth daily. 90 tablet 3  . escitalopram (LEXAPRO) 10 MG tablet Take 1 tablet (10 mg total) by mouth daily. 90 tablet 2  . Ferrous Sulfate (IRON) 90 (18 Fe) MG TABS Take 18 mg by mouth daily.    . fluticasone (FLONASE) 50 MCG/ACT nasal spray Place 2 sprays into both nostrils daily. 16 g 2  . lisinopril (PRINIVIL,ZESTRIL) 20 MG tablet Take 1 tablet (20 mg total) by mouth daily. 90 tablet 3  . LYRICA 50 MG capsule TAKE ONE CAPSULE BY MOUTH TWICE A DAY 60 capsule 5  . metFORMIN (GLUCOPHAGE) 500 MG tablet Take 1 tablet (500 mg  total) by mouth daily with breakfast. 180 tablet 3  . Multiple Vitamin (MULTIVITAMIN WITH MINERALS) TABS tablet Take 1 tablet by mouth daily.    . nitroGLYCERIN (NITROSTAT) 0.4 MG SL tablet Place 1 tablet (0.4 mg total) under the tongue every 5 (five) minutes as needed for chest pain. 90 tablet 3  . polyethylene glycol (MIRALAX / GLYCOLAX) packet Take 17 g by mouth daily as needed for mild constipation.    . senna-docusate (SENNA PLUS) 8.6-50 MG tablet Take 1 tablet by mouth daily as needed for mild constipation. Reported on 03/22/2016    . warfarin (COUMADIN) 10 MG tablet TAKE AS DIRECTED BY ANTICOAGULATION CLINIC. 90 tablet 3   No current facility-administered medications on file prior to visit.     Allergies  Allergen Reactions  . Adhesive [Tape] Other (See Comments)    Painful, Please use "paper" tape  . Other     NO BLOOD -JEHOVAH'S WITNESS-SIGNED REFUSAL BUT WOULD TAKE ALBUMIN     Assessment/Plan:  1. Hypertension - BP remains above goal < 130/10mmHg. Will check BMET today on increased dose of lisinopril 20mg  daily. Pt will need more than 1 agent for BP control, will start amlodipine 5mg  daily. F/u in HTN clinic in 2 weeks. Will plan to titrate either amlodipine or lisinopril (assuming stable BMET) at that visit. Pt encouraged to use Mrs Deliah Boston rather than regular salt.  Campbell Agramonte E. Peggye Poon, PharmD, CPP, [facility] [address] [place], [place] [address] Phone: [phone]; Fax: [phone] 08/04/2017 12:03 PM

## 2017-08-05 LAB — BASIC METABOLIC PANEL
BUN/Creatinine Ratio: 19 (ref 10–24)
BUN: 18 mg/dL (ref 8–27)
CALCIUM: 9.3 mg/dL (ref 8.6–10.2)
CO2: 24 mmol/L (ref 20–29)
Chloride: 105 mmol/L (ref 96–106)
Creatinine, Ser: 0.95 mg/dL (ref 0.76–1.27)
GFR, EST AFRICAN AMERICAN: 98 mL/min/{1.73_m2} (ref >59)
GFR, EST NON AFRICAN AMERICAN: 85 mL/min/{1.73_m2} (ref >59)
Glucose: 79 mg/dL (ref 65–99)
POTASSIUM: 5.1 mmol/L (ref 3.5–5.2)
Sodium: 144 mmol/L (ref 134–144)

## 2017-08-09 ENCOUNTER — Telehealth: Payer: Self-pay | Admitting: Pharmacist

## 2017-08-09 MED ORDER — CHLORTHALIDONE 25 MG PO TABS: 25.0000 mg | ORAL_TABLET | Freq: Every day | ORAL | 11 refills | Status: DC

## 2017-08-09 NOTE — Telephone Encounter (Signed)
BMET drawn last week on lisinopril 20mg  showed K increased to 5.1. Called pt to have him stop amlodipine and instead start chlorthalidone 25mg  daily to help lower his K. He will continue lisinopril. Pt will keep f/u appt in HTN clinic in 2 weeks, will check BMET at that time.

## 2017-08-10 ENCOUNTER — Ambulatory Visit (INDEPENDENT_AMBULATORY_CARE_PROVIDER_SITE_OTHER): Payer: PPO | Admitting: General Practice

## 2017-08-10 DIAGNOSIS — Z5181 Encounter for therapeutic drug level monitoring: Secondary | ICD-10-CM

## 2017-08-10 NOTE — Patient Instructions (Signed)
Pre visit review using our clinic review tool, if applicable. No additional management support is needed unless otherwise documented below in the visit note. 

## 2017-08-16 ENCOUNTER — Telehealth: Payer: Self-pay | Admitting: Family Medicine

## 2017-08-16 ENCOUNTER — Other Ambulatory Visit: Payer: Self-pay | Admitting: Pharmacist

## 2017-08-16 MED ORDER — CHLORTHALIDONE 25 MG PO TABS: 25.0000 mg | ORAL_TABLET | Freq: Every day | ORAL | 11 refills | Status: DC

## 2017-08-16 NOTE — Telephone Encounter (Signed)
Patient is requesting samples of pennsaid

## 2017-08-17 ENCOUNTER — Ambulatory Visit (INDEPENDENT_AMBULATORY_CARE_PROVIDER_SITE_OTHER): Payer: PPO | Admitting: General Practice

## 2017-08-17 DIAGNOSIS — Z7901 Long term (current) use of anticoagulants: Secondary | ICD-10-CM

## 2017-08-17 DIAGNOSIS — Z5181 Encounter for therapeutic drug level monitoring: Secondary | ICD-10-CM

## 2017-08-17 HISTORY — DX: Long term (current) use of anticoagulants: Z79.01

## 2017-08-17 LAB — POCT INR: INR: 2.3

## 2017-08-17 NOTE — Progress Notes (Signed)
I have reviewed and agree with the plan. 

## 2017-08-17 NOTE — Patient Instructions (Signed)
Pre visit review using our clinic review tool, if applicable. No additional management support is needed unless otherwise documented below in the visit note. 

## 2017-08-17 NOTE — Telephone Encounter (Signed)
Left msg making pt aware samples will be at front desk when ready to pick up.

## 2017-08-23 ENCOUNTER — Ambulatory Visit (INDEPENDENT_AMBULATORY_CARE_PROVIDER_SITE_OTHER): Payer: PPO | Admitting: Pharmacist

## 2017-08-23 VITALS — BP 120/70 | HR 58

## 2017-08-23 DIAGNOSIS — I10 Essential (primary) hypertension: Secondary | ICD-10-CM

## 2017-08-23 LAB — BASIC METABOLIC PANEL
BUN/Creatinine Ratio: 22 (ref 10–24)
BUN: 22 mg/dL (ref 8–27)
CO2: 24 mmol/L (ref 20–29)
Calcium: 9.8 mg/dL (ref 8.6–10.2)
Chloride: 105 mmol/L (ref 96–106)
Creatinine, Ser: 1 mg/dL (ref 0.76–1.27)
GFR calc Af Amer: 92 mL/min/{1.73_m2} (ref >59)
GFR calc non Af Amer: 80 mL/min/{1.73_m2} (ref >59)
Glucose: 122 mg/dL — ABNORMAL HIGH (ref 65–99)
Potassium: 4.9 mmol/L (ref 3.5–5.2)
Sodium: 140 mmol/L (ref 134–144)

## 2017-08-23 NOTE — Progress Notes (Signed)
Patient ID: [name]                 DOB: [birth year]                      MRN: [id number]     HPI: [name] is a 61 y.o. male referred by Melina Copa, PA to HTN clinic. PMH is significant for CAD by cardiac CT, elevated calcium score of 1186 (96th percentile) DM, HTN with hx of hypotension after losing weight, obesity, HLD, recurrent DVTs and PEs on chronic anticoagulation, and prior renal insufficiency. On 07/28/17 office visit BP was elevated at 154/106 and lisinopril was increased from 5mg  to 20mg  daily. At last visit pt's BP remained elevated at 162/27mmHg. Lisinopril 20mg  was continued and amlodipine 5mg  daily was initiated.  BMET results on 08/04/17 showed K increased to 5.1. Called pt to have him stop amlodipine and start chlorthalidone 25mg  daily to help lower his K.   Pt presents today with wife for follow up. Pt is in good spirits and states that he has been tolerating medications well.  He had dizziness while taking amlodipine, but this has resolved now that he has stopped taking it.  Pt stopped taking amlodipine 08/09/17 and has been taking lisinopril 20mg  daily and chlorthalidone 25mg  daily for the past 2 weeks. Pt has been taking BP at home sporadically and has been trying to do better with his low-salt diet.   Current HTN meds: lisinopril 20mg  daily, chlorthalidone 25mg  daily BP goal: <130/73mmHg  Family History: Mother with hx of HTN, 3 sisters and a brother with DM  Social History: Former smoker 1/4 PPD for 5 years, quit in 1980. Occasional alcohol use, denies illicit drug use.  Diet: Drinks flavored water, does not have caffeine. Likes protein drinks, chicken, fish, greens and fruit. Does add salt to food frequently.   Exercise: Treadmill and bicycle at the gym, occasional free weights. Goes a few times a week and walks as well.  Home BP readings: 145/95, 141/91, 140/77  Wt Readings from Last 3 Encounters:  07/28/17 264 lb 6.4 oz (119.9 kg)  06/28/17 265 lb (120.2  kg)  06/23/17 265 lb (120.2 kg)   BP Readings from Last 3 Encounters:  08/04/17 (!) 162/88  07/28/17 (!) 154/106  06/28/17 (!) 150/98   Pulse Readings from Last 3 Encounters:  08/04/17 (!) 51  07/28/17 (!) 50  06/28/17 64    Renal function: CrCl cannot be calculated (Unknown ideal weight.).  Past Medical History:  Diagnosis Date  . BACK PAIN   . CAD in native artery    a. moderate by cardiac CT 2016.  Marland Kitchen COLONIC POLYPS, HX OF   . DEGENERATIVE JOINT DISEASE, KNEES, BILATERAL   . Depression   . Diabetes mellitus    type II  . DIVERTICULOSIS, COLON   . DVT, lower extremity ([place])    right  . Hyperlipidemia   . Hypertension   . HYPOGONADISM, MALE   . HYPOTENSION, ORTHOSTATIC   . Kidney stones   . Long term current use of anticoagulant   . Obesity   . Obstructive sleep apnea    uses cpap setting 8 to 10  . Pulmonary emboli ([place])   . Renal insufficiency    a. prior h/o, does not appear chronic.    Current Outpatient Prescriptions on File Prior to Visit  Medication Sig Dispense Refill  . Artificial Tear Ointment (ARTIFICIAL TEARS) ointment Place 1 drop into both  eyes as needed (for dry eyes).    Marland Kitchen aspirin EC 81 MG tablet Take 81 mg by mouth daily.    Marland Kitchen atorvastatin (LIPITOR) 80 MG tablet TAKE 1 TABLET (80 MG TOTAL) BY MOUTH DAILY. (Patient taking differently: TAKE 1 TABLET (80 MG TOTAL) BY MOUTH every evening) 90 tablet 3  . Calcium Carbonate (CALCI-CHEW PO) Take 1 tablet by mouth daily.     . chlorthalidone (HYGROTON) 25 MG tablet Take 1 tablet (25 mg total) by mouth daily. 30 tablet 11  . Cyanocobalamin (VITAMIN B-12 SL) Place 1 tablet under the tongue daily.    Marland Kitchen escitalopram (LEXAPRO) 10 MG tablet Take 1 tablet (10 mg total) by mouth daily. 90 tablet 3  . Ferrous Sulfate (IRON) 90 (18 Fe) MG TABS Take 18 mg by mouth daily.    . fluticasone (FLONASE) 50 MCG/ACT nasal spray Place 2 sprays into both nostrils daily. 16 g 2  . lisinopril (PRINIVIL,ZESTRIL) 20 MG tablet  Take 1 tablet (20 mg total) by mouth daily. 90 tablet 3  . LYRICA 50 MG capsule TAKE ONE CAPSULE BY MOUTH TWICE A DAY 60 capsule 5  . metFORMIN (GLUCOPHAGE) 500 MG tablet Take 1 tablet (500 mg total) by mouth daily with breakfast. 180 tablet 3  . Multiple Vitamin (MULTIVITAMIN WITH MINERALS) TABS tablet Take 1 tablet by mouth daily.    . nitroGLYCERIN (NITROSTAT) 0.4 MG SL tablet Place 1 tablet (0.4 mg total) under the tongue every 5 (five) minutes as needed for chest pain. 90 tablet 3  . polyethylene glycol (MIRALAX / GLYCOLAX) packet Take 17 g by mouth daily as needed for mild constipation.    . senna-docusate (SENNA PLUS) 8.6-50 MG tablet Take 1 tablet by mouth daily as needed for mild constipation. Reported on 03/22/2016    . warfarin (COUMADIN) 10 MG tablet TAKE AS DIRECTED BY ANTICOAGULATION CLINIC. 90 tablet 3   No current facility-administered medications on file prior to visit.     Allergies  Allergen Reactions  . Adhesive [Tape] Other (See Comments)    Painful, Please use "paper" tape  . Other     NO BLOOD -JEHOVAH'S WITNESS-SIGNED REFUSAL BUT WOULD TAKE ALBUMIN     Assessment/Plan:  1. Hypertension: Pt's BP was at goal today of <130/63mmHg. Plan to continue lisinopril 20mg  daily and chlorthalidone 25mg  daily. Will check BMET today to confirm K has decreased since adding on chlorthalidone, and that kidney function is stable.  May need to re-initiate amlodipine if lisinopril has caused a SCr bump in labs.  Encouraged pt to continue low salt diet and exercising. Advised pt to continue to monitor BP at home and to call clinic with any consistently elevated readings. F/u in HTN clinic as needed.  Pt seen by Maple Mirza, P4 pharmacy student.  Verlene Glantz E. Mckenleigh Tarlton, PharmD, CPP, [facility] [address], [place], [place] [address] Phone: [phone]; Fax: [phone] 08/23/2017 3:09 PM

## 2017-08-23 NOTE — Patient Instructions (Signed)
Thank you for coming to see [place] today!  Your blood pressure was at goal today of <130/80 at 120/70.   Continue taking the lisinopril 20mg  daily and chlorthalidone 25mg  daily.   Continue the good work with the low salt diet and exercising!  Please keep checking your blood pressure at home and call us if you have any questions or concerns at [phone].  Have a great day!

## 2017-08-31 ENCOUNTER — Telehealth: Payer: Self-pay | Admitting: Physician Assistant

## 2017-08-31 NOTE — Telephone Encounter (Signed)
Called patient about his lab results. Patient aware of results.

## 2017-08-31 NOTE — Telephone Encounter (Signed)
New message    Pt is returning call from yesterday about lab results.

## 2017-09-15 DIAGNOSIS — Z6835 Body mass index (BMI) 35.0-35.9, adult: Secondary | ICD-10-CM | POA: Diagnosis not present

## 2017-09-15 DIAGNOSIS — R635 Abnormal weight gain: Secondary | ICD-10-CM | POA: Diagnosis not present

## 2017-09-15 DIAGNOSIS — Z9884 Bariatric surgery status: Secondary | ICD-10-CM | POA: Diagnosis not present

## 2017-09-16 ENCOUNTER — Ambulatory Visit (INDEPENDENT_AMBULATORY_CARE_PROVIDER_SITE_OTHER): Payer: PPO | Admitting: General Practice

## 2017-09-16 DIAGNOSIS — Z7901 Long term (current) use of anticoagulants: Secondary | ICD-10-CM

## 2017-09-16 LAB — POCT INR: INR: 3

## 2017-09-23 ENCOUNTER — Ambulatory Visit (INDEPENDENT_AMBULATORY_CARE_PROVIDER_SITE_OTHER): Payer: PPO | Admitting: *Deleted

## 2017-09-23 VITALS — BP 138/88 | HR 70 | Resp 20 | Ht 74.0 in | Wt 268.0 lb

## 2017-09-23 DIAGNOSIS — Z23 Encounter for immunization: Secondary | ICD-10-CM | POA: Diagnosis not present

## 2017-09-23 DIAGNOSIS — Z0000 Encounter for general adult medical examination without abnormal findings: Secondary | ICD-10-CM | POA: Diagnosis not present

## 2017-09-23 NOTE — Patient Instructions (Addendum)
Continue doing brain stimulating activities (puzzles, reading, adult coloring books, staying active) to keep memory sharp.   Continue to eat heart healthy diet (full of fruits, vegetables, whole grains, lean protein, water--limit salt, fat, and sugar intake) and increase physical activity as tolerated.   Mr. Netzel , Thank you for taking time to come for your Medicare Wellness Visit. I appreciate your ongoing commitment to your health goals. Please review the following plan we discussed and let me know if I can assist you in the future.   These are the goals we discussed: Goals    . Be as healthy as possible and continue to lose weight          Continue to exercise and eat healthy.    . Exercise 3x per week (30 min per time)          Hopes to stay active; will focus on keeping back strong; to fup with PT regarding exercises recommended to keep the back strong. Golf for fun     . Stay as healthy as possible and maintain my weight     . Weight (lb) < 225 lb (102.1 kg)          Continue with healthy regiment; still seeing weight loss on current regiment; Every 2 months approx 2 lbs;        This is a list of the screening recommended for you and due dates:  Health Maintenance  Topic Date Due  . Flu Shot  03/06/2018*  . Hemoglobin A1C  12/29/2017  . Eye exam for diabetics  04/19/2018  . Complete foot exam   06/28/2018  . Tetanus Vaccine  03/12/2019  . Colon Cancer Screening  03/18/2020  . Pneumococcal vaccine  Completed  .  Hepatitis C: One time screening is recommended by Center for Disease Control  (CDC) for  adults born from [birth year] through 1967.   Completed  . HIV Screening  Completed  *Topic was postponed. The date shown is not the original due date.    Fat and Cholesterol Restricted Diet Getting too much fat and cholesterol in your diet may cause health problems. Following this diet helps keep your fat and cholesterol at normal levels. This can keep you from getting  sick. What types of fat should I choose?  Choose monosaturated and polyunsaturated fats. These are found in foods such as olive oil, canola oil, flaxseeds, walnuts, almonds, and seeds.  Eat more omega-3 fats. Good choices include salmon, mackerel, sardines, tuna, flaxseed oil, and ground flaxseeds.  Limit saturated fats. These are in animal products such as meats, butter, and cream. They can also be in plant products such as palm oil, palm kernel oil, and coconut oil.  Avoid foods with partially hydrogenated oils in them. These contain trans fats. Examples of foods that have trans fats are stick margarine, some tub margarines, cookies, crackers, and other baked goods. What general guidelines do I need to follow?  Check food labels. Look for the words "trans fat" and "saturated fat."  When preparing a meal: ? Fill half of your plate with vegetables and green salads. ? Fill one fourth of your plate with whole grains. Look for the word "whole" as the first word in the ingredient list. ? Fill one fourth of your plate with lean protein foods.  Eat more foods that have fiber, like apples, carrots, beans, peas, and barley.  Eat more home-cooked foods. Eat less at restaurants and buffets.  Limit or avoid alcohol.  Limit  foods high in starch and sugar.  Limit fried foods.  Cook foods without frying them. Baking, boiling, grilling, and broiling are all great options.  Lose weight if you are overweight. Losing even a small amount of weight can help your overall health. It can also help prevent diseases such as diabetes and heart disease. What foods can I eat? Grains Whole grains, such as whole wheat or whole grain breads, crackers, cereals, and pasta. Unsweetened oatmeal, bulgur, barley, quinoa, or brown rice. Corn or whole wheat flour tortillas. Vegetables Fresh or frozen vegetables (raw, steamed, roasted, or grilled). Green salads. Fruits All fresh, canned (in natural juice), or frozen  fruits. Meat and Other Protein Products Ground beef (85% or leaner), grass-fed beef, or beef trimmed of fat. Skinless chicken or [place]. Ground chicken or [place]. Pork trimmed of fat. All fish and seafood. Eggs. Dried beans, peas, or lentils. Unsalted nuts or seeds. Unsalted canned or dry beans. Dairy Low-fat dairy products, such as skim or 1% milk, 2% or reduced-fat cheeses, low-fat ricotta or cottage cheese, or plain low-fat yogurt. Fats and Oils Tub margarines without trans fats. Light or reduced-fat mayonnaise and salad dressings. Avocado. Olive, canola, sesame, or safflower oils. Natural peanut or almond butter (choose ones without added sugar and oil). The items listed above may not be a complete list of recommended foods or beverages. Contact your dietitian for more options. What foods are not recommended? Grains White bread. White pasta. White rice. Cornbread. Bagels, pastries, and croissants. Crackers that contain trans fat. Vegetables White potatoes. Corn. Creamed or fried vegetables. Vegetables in a cheese sauce. Fruits Dried fruits. Canned fruit in light or heavy syrup. Fruit juice. Meat and Other Protein Products Fatty cuts of meat. Ribs, chicken wings, bacon, sausage, bologna, salami, chitterlings, fatback, hot dogs, bratwurst, and packaged luncheon meats. Liver and organ meats. Dairy Whole or 2% milk, cream, half-and-half, and cream cheese. Whole milk cheeses. Whole-fat or sweetened yogurt. Full-fat cheeses. Nondairy creamers and whipped toppings. Processed cheese, cheese spreads, or cheese curds. Sweets and Desserts Corn syrup, sugars, honey, and molasses. Candy. Jam and jelly. Syrup. Sweetened cereals. Cookies, pies, cakes, donuts, muffins, and ice cream. Fats and Oils Butter, stick margarine, lard, shortening, ghee, or bacon fat. Coconut, palm kernel, or palm oils. Beverages Alcohol. Sweetened drinks (such as sodas, lemonade, and fruit drinks or punches). The items listed  above may not be a complete list of foods and beverages to avoid. Contact your dietitian for more information. This information is not intended to replace advice given to you by your health care provider. Make sure you discuss any questions you have with your health care provider. Document Released: 05/23/2012 Document Revised: 07/29/2016 Document Reviewed: 02/21/2014 Elsevier Interactive Patient Education  2018 [employer].  Carbohydrate Counting for Diabetes Mellitus, Adult Carbohydrate counting is a method for keeping track of how many carbohydrates you eat. Eating carbohydrates naturally increases the amount of sugar (glucose) in the blood. Counting how many carbohydrates you eat helps keep your blood glucose within normal limits, which helps you manage your diabetes (diabetes mellitus). It is important to know how many carbohydrates you can safely have in each meal. This is different for every person. A diet and nutrition specialist (registered dietitian) can help you make a meal plan and calculate how many carbohydrates you should have at each meal and snack. Carbohydrates are found in the following foods:  Grains, such as breads and cereals.  Dried beans and soy products.  Starchy vegetables, such as potatoes, peas,  and corn.  Fruit and fruit juices.  Milk and yogurt.  Sweets and snack foods, such as cake, cookies, candy, chips, and soft drinks.  How do I count carbohydrates? There are two ways to count carbohydrates in food. You can use either of the methods or a combination of both. Reading "Nutrition Facts" on packaged food The "Nutrition Facts" list is included on the labels of almost all packaged foods and beverages in the [place] It includes:  The serving size.  Information about nutrients in each serving, including the grams (g) of carbohydrate per serving.  To use the "Nutrition Facts":  Decide how many servings you will have.  Multiply the number of servings by the  number of carbohydrates per serving.  The resulting number is the total amount of carbohydrates that you will be having.  Learning standard serving sizes of other foods When you eat foods containing carbohydrates that are not packaged or do not include "Nutrition Facts" on the label, you need to measure the servings in order to count the amount of carbohydrates:  Measure the foods that you will eat with a food scale or measuring cup, if needed.  Decide how many standard-size servings you will eat.  Multiply the number of servings by 15. Most carbohydrate-rich foods have about 15 g of carbohydrates per serving. ? For example, if you eat 8 oz (170 g) of strawberries, you will have eaten 2 servings and 30 g of carbohydrates (2 servings x 15 g = 30 g).  For foods that have more than one food mixed, such as soups and casseroles, you must count the carbohydrates in each food that is included.  The following list contains standard serving sizes of common carbohydrate-rich foods. Each of these servings has about 15 g of carbohydrates:   hamburger bun or  English muffin.   oz (15 mL) syrup.   oz (14 g) jelly.  1 slice of bread.  1 six-inch tortilla.  3 oz (85 g) cooked rice or pasta.  4 oz (113 g) cooked dried beans.  4 oz (113 g) starchy vegetable, such as peas, corn, or potatoes.  4 oz (113 g) hot cereal.  4 oz (113 g) mashed potatoes or  of a large baked potato.  4 oz (113 g) canned or frozen fruit.  4 oz (120 mL) fruit juice.  4-6 crackers.  6 chicken nuggets.  6 oz (170 g) unsweetened dry cereal.  6 oz (170 g) plain fat-free yogurt or yogurt sweetened with artificial sweeteners.  8 oz (240 mL) milk.  8 oz (170 g) fresh fruit or one small piece of fruit.  24 oz (680 g) popped popcorn.  Example of carbohydrate counting Sample meal  3 oz (85 g) chicken breast.  6 oz (170 g) brown rice.  4 oz (113 g) corn.  8 oz (240 mL) milk.  8 oz (170 g) strawberries  with sugar-free whipped topping. Carbohydrate calculation 1. Identify the foods that contain carbohydrates: ? Rice. ? Corn. ? Milk. ? Strawberries. 2. Calculate how many servings you have of each food: ? 2 servings rice. ? 1 serving corn. ? 1 serving milk. ? 1 serving strawberries. 3. Multiply each number of servings by 15 g: ? 2 servings rice x 15 g = 30 g. ? 1 serving corn x 15 g = 15 g. ? 1 serving milk x 15 g = 15 g. ? 1 serving strawberries x 15 g = 15 g. 4. Add together all of the amounts  to find the total grams of carbohydrates eaten: ? 30 g + 15 g + 15 g + 15 g = 75 g of carbohydrates total. This information is not intended to replace advice given to you by your health care provider. Make sure you discuss any questions you have with your health care provider. Document Released: 11/22/2005 Document Revised: 06/11/2016 Document Reviewed: 05/05/2016 Elsevier Interactive Patient Education  2018 [place] Eating Plan DASH stands for "Dietary Approaches to Stop Hypertension." The DASH eating plan is a healthy eating plan that has been shown to reduce high blood pressure (hypertension). It may also reduce your risk for type 2 diabetes, heart disease, and stroke. The DASH eating plan may also help with weight loss. What are tips for following this plan? General guidelines  Avoid eating more than 2,300 mg (milligrams) of salt (sodium) a day. If you have hypertension, you may need to reduce your sodium intake to 1,500 mg a day.  Limit alcohol intake to no more than 1 drink a day for nonpregnant women and 2 drinks a day for men. One drink equals 12 oz of beer, 5 oz of Kaipo Ardis, or 1 oz of hard liquor.  Work with your health care provider to maintain a healthy body weight or to lose weight. Ask what an ideal weight is for you.  Get at least 30 minutes of exercise that causes your heart to beat faster (aerobic exercise) most days of the week. Activities may include walking,  swimming, or biking.  Work with your health care provider or diet and nutrition specialist (dietitian) to adjust your eating plan to your individual calorie needs. Reading food labels  Check food labels for the amount of sodium per serving. Choose foods with less than 5 percent of the Daily Value of sodium. Generally, foods with less than 300 mg of sodium per serving fit into this eating plan.  To find whole grains, look for the word "whole" as the first word in the ingredient list. Shopping  Buy products labeled as "low-sodium" or "no salt added."  Buy fresh foods. Avoid canned foods and premade or frozen meals. Cooking  Avoid adding salt when cooking. Use salt-free seasonings or herbs instead of table salt or sea salt. Check with your health care provider or pharmacist before using salt substitutes.  Do not fry foods. Cook foods using healthy methods such as baking, boiling, grilling, and broiling instead.  Cook with heart-healthy oils, such as olive, canola, soybean, or sunflower oil. Meal planning   Eat a balanced diet that includes: ? 5 or more servings of fruits and vegetables each day. At each meal, try to fill half of your plate with fruits and vegetables. ? Up to 6-8 servings of whole grains each day. ? Less than 6 oz of lean meat, poultry, or fish each day. A 3-oz serving of meat is about the same size as a deck of cards. One egg equals 1 oz. ? 2 servings of low-fat dairy each day. ? A serving of nuts, seeds, or beans 5 times each week. ? Heart-healthy fats. Healthy fats called Omega-3 fatty acids are found in foods such as flaxseeds and coldwater fish, like sardines, salmon, and mackerel.  Limit how much you eat of the following: ? Canned or prepackaged foods. ? Food that is high in trans fat, such as fried foods. ? Food that is high in saturated fat, such as fatty meat. ? Sweets, desserts, sugary drinks, and other foods with added sugar. ?  Full-fat dairy  products.  Do not salt foods before eating.  Try to eat at least 2 vegetarian meals each week.  Eat more home-cooked food and less restaurant, buffet, and fast food.  When eating at a restaurant, ask that your food be prepared with less salt or no salt, if possible. What foods are recommended? The items listed may not be a complete list. Talk with your dietitian about what dietary choices are best for you. Grains Whole-grain or whole-wheat bread. Whole-grain or whole-wheat pasta. Brown rice. [place]. Bulgur. Whole-grain and low-sodium cereals. Pita bread. Low-fat, low-sodium crackers. Whole-wheat flour tortillas. Vegetables Fresh or frozen vegetables (raw, steamed, roasted, or grilled). Low-sodium or reduced-sodium tomato and vegetable juice. Low-sodium or reduced-sodium tomato sauce and tomato paste. Low-sodium or reduced-sodium canned vegetables. Fruits All fresh, dried, or frozen fruit. Canned fruit in natural juice (without added sugar). Meat and other protein foods Skinless chicken or [place]. Ground chicken or [place]. Pork with fat trimmed off. Fish and seafood. Egg whites. Dried beans, peas, or lentils. Unsalted nuts, nut butters, and seeds. Unsalted canned beans. Lean cuts of beef with fat trimmed off. Low-sodium, lean deli meat. Dairy Low-fat (1%) or fat-free (skim) milk. Fat-free, low-fat, or reduced-fat cheeses. Nonfat, low-sodium ricotta or cottage cheese. Low-fat or nonfat yogurt. Low-fat, low-sodium cheese. Fats and oils Soft margarine without trans fats. Vegetable oil. Low-fat, reduced-fat, or light mayonnaise and salad dressings (reduced-sodium). Canola, safflower, olive, soybean, and sunflower oils. Avocado. Seasoning and other foods Herbs. Spices. Seasoning mixes without salt. Unsalted popcorn and pretzels. Fat-free sweets. What foods are not recommended? The items listed may not be a complete list. Talk with your dietitian about what dietary choices are best for  you. Grains Baked goods made with fat, such as croissants, muffins, or some breads. Dry pasta or rice meal packs. Vegetables Creamed or fried vegetables. Vegetables in a cheese sauce. Regular canned vegetables (not low-sodium or reduced-sodium). Regular canned tomato sauce and paste (not low-sodium or reduced-sodium). Regular tomato and vegetable juice (not low-sodium or reduced-sodium). Angie Fava. Olives. Fruits Canned fruit in a light or heavy syrup. Fried fruit. Fruit in cream or butter sauce. Meat and other protein foods Fatty cuts of meat. Ribs. Fried meat. Berniece Salines. Sausage. Bologna and other processed lunch meats. Salami. Fatback. Hotdogs. Bratwurst. Salted nuts and seeds. Canned beans with added salt. Canned or smoked fish. Whole eggs or egg yolks. Chicken or [place] with skin. Dairy Whole or 2% milk, cream, and half-and-half. Whole or full-fat cream cheese. Whole-fat or sweetened yogurt. Full-fat cheese. Nondairy creamers. Whipped toppings. Processed cheese and cheese spreads. Fats and oils Butter. Stick margarine. Lard. Shortening. Ghee. Bacon fat. Tropical oils, such as coconut, palm kernel, or palm oil. Seasoning and other foods Salted popcorn and pretzels. Onion salt, garlic salt, seasoned salt, table salt, and sea salt. Worcestershire sauce. Tartar sauce. Barbecue sauce. Teriyaki sauce. Soy sauce, including reduced-sodium. Steak sauce. Canned and packaged gravies. Fish sauce. Oyster sauce. Cocktail sauce. Horseradish that you find on the shelf. Ketchup. Mustard. Meat flavorings and tenderizers. Bouillon cubes. Hot sauce and Tabasco sauce. Premade or packaged marinades. Premade or packaged taco seasonings. Relishes. Regular salad dressings. Where to find more information:  National Heart, Lung, and [place]: https://wilson-eaton.com/  American Heart Association: www.heart.org Summary  The DASH eating plan is a healthy eating plan that has been shown to reduce high blood pressure  (hypertension). It may also reduce your risk for type 2 diabetes, heart disease, and stroke.  With the DASH eating plan,  you should limit salt (sodium) intake to 2,300 mg a day. If you have hypertension, you may need to reduce your sodium intake to 1,500 mg a day.  When on the DASH eating plan, aim to eat more fresh fruits and vegetables, whole grains, lean proteins, low-fat dairy, and heart-healthy fats.  Work with your health care provider or diet and nutrition specialist (dietitian) to adjust your eating plan to your individual calorie needs. This information is not intended to replace advice given to you by your health care provider. Make sure you discuss any questions you have with your health care provider. Document Released: 11/11/2011 Document Revised: 11/15/2016 Document Reviewed: 11/15/2016 Elsevier Interactive Patient Education  2017 [employer]  Influenza Virus Vaccine (Flucelvax) What is this medicine? INFLUENZA VIRUS VACCINE (in floo EN zuh VAHY ruhs vak SEEN) helps to reduce the risk of getting influenza also known as the flu. The vaccine only helps protect you against some strains of the flu. This medicine may be used for other purposes; ask your health care provider or pharmacist if you have questions. COMMON BRAND NAME(S): FLUCELVAX What should I tell my health care provider before I take this medicine? They need to know if you have any of these conditions: -bleeding disorder like hemophilia -fever or infection -Guillain-Barre syndrome or other neurological problems -immune system problems -infection with the human immunodeficiency virus (HIV) or AIDS -low blood platelet counts -multiple sclerosis -an unusual or allergic reaction to influenza virus vaccine, other medicines, foods, dyes or preservatives -pregnant or trying to get pregnant -breast-feeding How should I use this medicine? This vaccine is for injection into a muscle. It is given by a health care  professional. A copy of Vaccine Information Statements will be given before each vaccination. Read this sheet carefully each time. The sheet may change frequently. Talk to your pediatrician regarding the use of this medicine in children. Special care may be needed. Overdosage: If you think you've taken too much of this medicine contact a poison control center or emergency room at once. Overdosage: If you think you have taken too much of this medicine contact a poison control center or emergency room at once. NOTE: This medicine is only for you. Do not share this medicine with others. What if I miss a dose? This does not apply. What may interact with this medicine? -chemotherapy or radiation therapy -medicines that lower your immune system like etanercept, anakinra, infliximab, and adalimumab -medicines that treat or prevent blood clots like warfarin -phenytoin -steroid medicines like prednisone or cortisone -theophylline -vaccines This list may not describe all possible interactions. Give your health care provider a list of all the medicines, herbs, non-prescription drugs, or dietary supplements you use. Also tell them if you smoke, drink alcohol, or use illegal drugs. Some items may interact with your medicine. What should I watch for while using this medicine? Report any side effects that do not go away within 3 days to your doctor or health care professional. Call your health care provider if any unusual symptoms occur within 6 weeks of receiving this vaccine. You may still catch the flu, but the illness is not usually as bad. You cannot get the flu from the vaccine. The vaccine will not protect against colds or other illnesses that may cause fever. The vaccine is needed every year. What side effects may I notice from receiving this medicine? Side effects that you should report to your doctor or health care professional as soon as possible: -allergic reactions like skin  rash, itching or  hives, swelling of the face, lips, or tongue Side effects that usually do not require medical attention (Report these to your doctor or health care professional if they continue or are bothersome.): -fever -headache -muscle aches and pains -pain, tenderness, redness, or swelling at the injection site -tiredness This list may not describe all possible side effects. Call your doctor for medical advice about side effects. You may report side effects to FDA at 1-800-FDA-1088. Where should I keep my medicine? The vaccine will be given by a health care professional in a clinic, pharmacy, doctor's office, or other health care setting. You will not be given vaccine doses to store at home. NOTE: This sheet is a summary. It may not cover all possible information. If you have questions about this medicine, talk to your doctor, pharmacist, or health care provider.  2018 Elsevier/Gold Standard (2011-11-03 14:06:47)

## 2017-09-23 NOTE — Progress Notes (Signed)
Pre visit review using our clinic review tool, if applicable. No additional management support is needed unless otherwise documented below in the visit note. 

## 2017-09-23 NOTE — Progress Notes (Addendum)
Subjective:   [name] is a 61 y.o. male who presents for Medicare Annual/Subsequent preventive examination.  Review of Systems:  No ROS.  Medicare Wellness Visit. Additional risk factors are reflected in the social history.  Cardiac Risk Factors include: advanced age (>55men, >3 women);diabetes mellitus;dyslipidemia;hypertension;male gender Sleep patterns: feels rested on waking, gets up 1-2 times nightly to void and sleeps 8-9 hours nightly.    Home Safety/Smoke Alarms: Feels safe in home. Smoke alarms in place.  Living environment; residence and Firearm Safety: 1-story house/ trailer, no firearms, Lives with wife, no needs for DME, good support system. Seat Belt Safety/Bike Helmet: Wears seat belt.      Objective:    Vitals: BP 138/88   Pulse 70   Resp 20   Ht 6\' 2"  (1.88 m)   Wt 268 lb (121.6 kg)   SpO2 99%   BMI 34.41 kg/m   Body mass index is 34.41 kg/m.  Tobacco History  Smoking Status  . Former Smoker  . Packs/day: 0.25  . Years: 5.00  . Quit date: 12/06/1978  Smokeless Tobacco  . Never Used     Counseling given: Not Answered   Past Medical History:  Diagnosis Date  . BACK PAIN   . CAD in native artery    a. moderate by cardiac CT 2015/04/01.  Marland Kitchen COLONIC POLYPS, HX OF   . DEGENERATIVE JOINT DISEASE, KNEES, BILATERAL   . Depression   . Diabetes mellitus    type II  . DIVERTICULOSIS, COLON   . DVT, lower extremity ([place])    right  . Hyperlipidemia   . Hypertension   . HYPOGONADISM, MALE   . HYPOTENSION, ORTHOSTATIC   . Kidney stones   . Long term current use of anticoagulant   . Obesity   . Obstructive sleep apnea    uses cpap setting 8 to 10  . Pulmonary emboli ([place])   . Renal insufficiency    a. prior h/o, does not appear chronic.   Past Surgical History:  Procedure Laterality Date  . ankle surgury  2001 bilateral   with bone spurs and torn tendon  . EDG  04/01/07   chronic Duodenitis  . GASTRIC BYPASS    . HEEL SPUR SURGERY Bilateral  31-Mar-2004  . knee surgury  2000 & 03-31-02    cartilage damage  . LUMBAR LAMINECTOMY/DECOMPRESSION MICRODISCECTOMY  09/28/2012   Procedure: LUMBAR LAMINECTOMY/DECOMPRESSION MICRODISCECTOMY 2 LEVELS;  Surgeon: [name], MD;  Location: [facility];  Service: Orthopedics;  Laterality: Left;  Decompressive laminectomy L4-L5. Microdiscectomy L5-S1  . REPLACEMENT TOTAL KNEE  2012-03-31  . TONSILLECTOMY AND ADENOIDECTOMY  age 64 or 61  . TOTAL KNEE ARTHROPLASTY  01/10/2012   Procedure: TOTAL KNEE ARTHROPLASTY;  Surgeon: [name], MD;  Location: [facility];  Service: Orthopedics;  Laterality: Left;   Family History  Problem Relation Age of Onset  . Hypertension Mother   . Prostate cancer Father   . Diabetes Sister        died with DM 2001/03/31, 2 more sisters with diabetes  . Heart failure Sister   . Diabetes Brother   . Diabetes Sister   . Diabetes Sister    History  Sexual Activity  . Sexual activity: Not Currently    Outpatient Encounter Prescriptions as of 09/23/2017  Medication Sig  . Artificial Tear Ointment (ARTIFICIAL TEARS) ointment Place 1 drop into both eyes as needed (for dry eyes).  Marland Kitchen aspirin EC 81 MG tablet Take 81 mg  by mouth daily.  Marland Kitchen atorvastatin (LIPITOR) 80 MG tablet TAKE 1 TABLET (80 MG TOTAL) BY MOUTH DAILY. (Patient taking differently: TAKE 1 TABLET (80 MG TOTAL) BY MOUTH every evening)  . Calcium Carbonate (CALCI-CHEW PO) Take 1 tablet by mouth daily.   . chlorthalidone (HYGROTON) 25 MG tablet Take 1 tablet (25 mg total) by mouth daily.  . Cyanocobalamin (VITAMIN B-12 SL) Place 1 tablet under the tongue daily.  Marland Kitchen escitalopram (LEXAPRO) 10 MG tablet Take 1 tablet (10 mg total) by mouth daily.  . Ferrous Sulfate (IRON) 90 (18 Fe) MG TABS Take 18 mg by mouth daily.  . fluticasone (FLONASE) 50 MCG/ACT nasal spray Place 2 sprays into both nostrils daily.  Marland Kitchen lisinopril (PRINIVIL,ZESTRIL) 20 MG tablet Take 1 tablet (20 mg total) by mouth daily.  Marland Kitchen LYRICA 50 MG capsule TAKE ONE CAPSULE BY  MOUTH TWICE A DAY  . metFORMIN (GLUCOPHAGE) 500 MG tablet Take 1 tablet (500 mg total) by mouth daily with breakfast.  . Multiple Vitamin (MULTIVITAMIN WITH MINERALS) TABS tablet Take 1 tablet by mouth daily.  . nitroGLYCERIN (NITROSTAT) 0.4 MG SL tablet Place 1 tablet (0.4 mg total) under the tongue every 5 (five) minutes as needed for chest pain.  . polyethylene glycol (MIRALAX / GLYCOLAX) packet Take 17 g by mouth daily as needed for mild constipation.  . senna-docusate (SENNA PLUS) 8.6-50 MG tablet Take 1 tablet by mouth daily as needed for mild constipation. Reported on 03/22/2016  . warfarin (COUMADIN) 10 MG tablet TAKE AS DIRECTED BY ANTICOAGULATION CLINIC.   No facility-administered encounter medications on file as of 09/23/2017.     Activities of Daily Living In your present state of health, do you have any difficulty performing the following activities: 09/23/2017  Hearing? N  Vision? N  Difficulty concentrating or making decisions? N  Walking or climbing stairs? N  Dressing or bathing? N  Doing errands, shopping? N  Preparing Food and eating ? N  Using the Toilet? N  In the past six months, have you accidently leaked urine? N  Do you have problems with loss of bowel control? N  Managing your Medications? N  Managing your Finances? N  Housekeeping or managing your Housekeeping? N  Some recent data might be hidden    Patient Care Team: Biagio Borg, MD as PCP - Geoffry Paradise, MD as Consulting Physician (Physical Medicine and Rehabilitation) Eldridge Abrahams, MD as Referring Physician (Surgical Oncology) Renato Shin, MD as Consulting Physician (Endocrinology)   Assessment:    Physical assessment deferred to PCP.  Exercise Activities and Dietary recommendations Current Exercise Habits: Structured exercise class, Type of exercise: calisthenics;walking;strength training/weights;stretching, Time (Minutes): 55, Frequency (Times/Week): 5, Weekly Exercise  (Minutes/Week): 275, Intensity: Mild, Exercise limited by: None identified  Diet (meal preparation, eat out, water intake, caffeinated beverages, dairy products, fruits and vegetables): in general, a "healthy" diet  , well balanced, eats a variety of fruits and vegetables daily, limits salt, fat/cholesterol, sugar, caffeine, drinks 6-8 glasses of water daily.  Discussed weight loss tips, Diet education was attached to patient's AVS.  Goals    . Be as healthy as possible and continue to lose weight          Continue to exercise and eat healthy.    . Exercise 3x per week (30 min per time)          Hopes to stay active; will focus on keeping back strong; to fup with PT regarding exercises recommended to keep the  back strong. Golf for fun     . Stay as healthy as possible and maintain my weight     . Weight (lb) < 225 lb (102.1 kg)          Continue with healthy regiment; still seeing weight loss on current regiment; Every 2 months approx 2 lbs;       Fall Risk Fall Risk  09/23/2017 06/28/2017 06/28/2016 12/22/2015 04/22/2015  Falls in the past year? No No No No No   Depression Screen PHQ 2/9 Scores 09/23/2017 06/28/2017 06/28/2016 12/22/2015  PHQ - 2 Score 0 0 0 0    Cognitive Function MMSE - Mini Mental State Exam 09/23/2017 06/28/2016  Not completed: - (No Data)  Orientation to time 5 -  Orientation to Place 5 -  Registration 3 -  Attention/ Calculation 5 -  Recall 2 -  Language- name 2 objects 2 -  Language- repeat 1 -  Language- follow 3 step command 3 -  Language- read & follow direction 1 -  Write a sentence 1 -  Copy design 1 -  Total score 29 -        Immunization History  Administered Date(s) Administered  . Influenza Split 08/19/2011, 10/30/2012  . Influenza Whole 09/21/2005, 09/02/2008, 12/24/2009  . Influenza,inj,Quad PF,6+ Mos 09/13/2013, 09/30/2014, 10/29/2015, 11/15/2016, 09/23/2017  . Pneumococcal Conjugate-13 03/13/2014  . Pneumococcal Polysaccharide-23  03/04/2008, 06/28/2016  . Td 03/11/2009  . Zoster 10/29/2015   Screening Tests Health Maintenance  Topic Date Due  . INFLUENZA VACCINE  03/06/2018 (Originally 07/06/2017)  . HEMOGLOBIN A1C  12/29/2017  . OPHTHALMOLOGY EXAM  04/19/2018  . FOOT EXAM  06/28/2018  . TETANUS/TDAP  03/12/2019  . COLONOSCOPY  03/18/2020  . PNEUMOCOCCAL POLYSACCHARIDE VACCINE  Completed  . Hepatitis C Screening  Completed  . HIV Screening  Completed      Plan:    Continue doing brain stimulating activities (puzzles, reading, adult coloring books, staying active) to keep memory sharp.   Continue to eat heart healthy diet (full of fruits, vegetables, whole grains, lean protein, water--limit salt, fat, and sugar intake) and increase physical activity as tolerated.  I have personally reviewed and noted the following in the patient's chart:   . Medical and social history . Use of alcohol, tobacco or illicit drugs  . Current medications and supplements . Functional ability and status . Nutritional status . Physical activity . Advanced directives . List of other physicians . Vitals . Screenings to include cognitive, depression, and falls . Referrals and appointments  In addition, I have reviewed and discussed with patient certain preventive protocols, quality metrics, and best practice recommendations. A written personalized care plan for preventive services as well as general preventive health recommendations were provided to patient.     Michiel Cowboy, RN  09/23/2017  Medical screening examination/treatment/procedure(s) were performed by non-physician practitioner and as supervising physician I was immediately available for consultation/collaboration. I agree with above. Cathlean Cower, MD

## 2017-09-24 ENCOUNTER — Other Ambulatory Visit: Payer: Self-pay | Admitting: Internal Medicine

## 2017-10-13 DIAGNOSIS — E669 Obesity, unspecified: Secondary | ICD-10-CM | POA: Diagnosis not present

## 2017-10-13 DIAGNOSIS — Z6835 Body mass index (BMI) 35.0-35.9, adult: Secondary | ICD-10-CM | POA: Diagnosis not present

## 2017-10-13 DIAGNOSIS — Z9884 Bariatric surgery status: Secondary | ICD-10-CM | POA: Diagnosis not present

## 2017-10-13 DIAGNOSIS — Z713 Dietary counseling and surveillance: Secondary | ICD-10-CM | POA: Diagnosis not present

## 2017-10-14 ENCOUNTER — Ambulatory Visit (INDEPENDENT_AMBULATORY_CARE_PROVIDER_SITE_OTHER): Payer: PPO | Admitting: General Practice

## 2017-10-14 DIAGNOSIS — Z7901 Long term (current) use of anticoagulants: Secondary | ICD-10-CM

## 2017-10-14 LAB — POCT INR: INR: 2.2

## 2017-10-14 NOTE — Patient Instructions (Signed)
Pre visit review using our clinic review tool, if applicable. No additional management support is needed unless otherwise documented below in the visit note. 

## 2017-11-08 DIAGNOSIS — E119 Type 2 diabetes mellitus without complications: Secondary | ICD-10-CM | POA: Diagnosis not present

## 2017-11-08 DIAGNOSIS — H35413 Lattice degeneration of retina, bilateral: Secondary | ICD-10-CM | POA: Diagnosis not present

## 2017-11-08 DIAGNOSIS — H43811 Vitreous degeneration, right eye: Secondary | ICD-10-CM | POA: Diagnosis not present

## 2017-11-08 DIAGNOSIS — H524 Presbyopia: Secondary | ICD-10-CM | POA: Diagnosis not present

## 2017-11-08 DIAGNOSIS — H2513 Age-related nuclear cataract, bilateral: Secondary | ICD-10-CM | POA: Diagnosis not present

## 2017-11-08 DIAGNOSIS — H5213 Myopia, bilateral: Secondary | ICD-10-CM | POA: Diagnosis not present

## 2017-11-08 DIAGNOSIS — H52223 Regular astigmatism, bilateral: Secondary | ICD-10-CM | POA: Diagnosis not present

## 2017-11-11 ENCOUNTER — Ambulatory Visit: Payer: PPO | Admitting: General Practice

## 2017-11-11 DIAGNOSIS — Z7901 Long term (current) use of anticoagulants: Secondary | ICD-10-CM | POA: Diagnosis not present

## 2017-11-11 LAB — POCT INR: INR: 3.1

## 2017-11-11 NOTE — Patient Instructions (Addendum)
Pre visit review using our clinic review tool, if applicable. No additional management support is needed unless otherwise documented below in the visit note.  Take 5 mg on Saturday and then continue to take 1 tablet all days except 2 tablets on Wednesdays.  Re-check in 4 weeks.

## 2017-12-09 ENCOUNTER — Ambulatory Visit (INDEPENDENT_AMBULATORY_CARE_PROVIDER_SITE_OTHER): Payer: PPO | Admitting: General Practice

## 2017-12-09 DIAGNOSIS — Z7901 Long term (current) use of anticoagulants: Secondary | ICD-10-CM

## 2017-12-09 LAB — POCT INR: INR: 2.1

## 2017-12-09 NOTE — Patient Instructions (Addendum)
Pre visit review using our clinic review tool, if applicable. No additional management support is needed unless otherwise documented below in the visit note.  Continue to take 1 tablet daily except 2 tablets on Wednesdays.  Re-check in 4 weeks.  

## 2017-12-21 ENCOUNTER — Other Ambulatory Visit (INDEPENDENT_AMBULATORY_CARE_PROVIDER_SITE_OTHER): Payer: Self-pay

## 2017-12-21 ENCOUNTER — Ambulatory Visit (INDEPENDENT_AMBULATORY_CARE_PROVIDER_SITE_OTHER): Payer: PPO | Admitting: Internal Medicine

## 2017-12-21 ENCOUNTER — Telehealth: Payer: Self-pay

## 2017-12-21 ENCOUNTER — Encounter: Payer: Self-pay | Admitting: Internal Medicine

## 2017-12-21 ENCOUNTER — Other Ambulatory Visit: Payer: Self-pay | Admitting: Internal Medicine

## 2017-12-21 VITALS — BP 122/84 | HR 69 | Temp 97.9°F | Ht 74.0 in | Wt 265.0 lb

## 2017-12-21 DIAGNOSIS — E119 Type 2 diabetes mellitus without complications: Secondary | ICD-10-CM

## 2017-12-21 DIAGNOSIS — M79672 Pain in left foot: Secondary | ICD-10-CM | POA: Diagnosis not present

## 2017-12-21 DIAGNOSIS — I10 Essential (primary) hypertension: Secondary | ICD-10-CM | POA: Diagnosis not present

## 2017-12-21 DIAGNOSIS — Z0000 Encounter for general adult medical examination without abnormal findings: Secondary | ICD-10-CM

## 2017-12-21 LAB — URINALYSIS, ROUTINE W REFLEX MICROSCOPIC
Bilirubin Urine: NEGATIVE
Hgb urine dipstick: NEGATIVE
KETONES UR: NEGATIVE
Leukocytes, UA: NEGATIVE
Nitrite: NEGATIVE
PH: 7 (ref 5.0–8.0)
RBC / HPF: NONE SEEN (ref 0–?)
SPECIFIC GRAVITY, URINE: 1.015 (ref 1.000–1.030)
TOTAL PROTEIN, URINE-UPE24: NEGATIVE
URINE GLUCOSE: NEGATIVE
UROBILINOGEN UA: 0.2 (ref 0.0–1.0)

## 2017-12-21 LAB — CBC WITH DIFFERENTIAL/PLATELET
BASOS PCT: 0.2 % (ref 0.0–3.0)
Basophils Absolute: 0 10*3/uL (ref 0.0–0.1)
EOS PCT: 1.4 % (ref 0.0–5.0)
Eosinophils Absolute: 0.1 10*3/uL (ref 0.0–0.7)
HCT: 44.7 % (ref 39.0–52.0)
HEMOGLOBIN: 13.7 g/dL (ref 13.0–17.0)
Lymphocytes Relative: 27.3 % (ref 12.0–46.0)
Lymphs Abs: 1.8 10*3/uL (ref 0.7–4.0)
MCHC: 30.6 g/dL (ref 30.0–36.0)
MCV: 73.7 fl — ABNORMAL LOW (ref 78.0–100.0)
MONO ABS: 0.5 10*3/uL (ref 0.1–1.0)
Monocytes Relative: 8.3 % (ref 3.0–12.0)
Neutro Abs: 4.1 10*3/uL (ref 1.4–7.7)
Neutrophils Relative %: 62.8 % (ref 43.0–77.0)
Platelets: 177 10*3/uL (ref 150.0–400.0)
RBC: 6.06 Mil/uL — ABNORMAL HIGH (ref 4.22–5.81)
RDW: 15.1 % (ref 11.5–15.5)
WBC: 6.6 10*3/uL (ref 4.0–10.5)

## 2017-12-21 LAB — LIPID PANEL
CHOLESTEROL: 144 mg/dL (ref 0–200)
HDL: 41.1 mg/dL (ref >39.00)
LDL Cholesterol: 83 mg/dL (ref 0–99)
NonHDL: 103.3
TRIGLYCERIDES: 104 mg/dL (ref 0.0–149.0)
Total CHOL/HDL Ratio: 4
VLDL: 20.8 mg/dL (ref 0.0–40.0)

## 2017-12-21 LAB — MICROALBUMIN / CREATININE URINE RATIO
Creatinine,U: 103.7 mg/dL
MICROALB/CREAT RATIO: 0.7 mg/g (ref 0.0–30.0)
Microalb, Ur: <0.7 mg/dL (ref 0.0–1.9)

## 2017-12-21 LAB — BASIC METABOLIC PANEL
BUN: 24 mg/dL — ABNORMAL HIGH (ref 6–23)
CO2: 31 mEq/L (ref 19–32)
Calcium: 9.3 mg/dL (ref 8.4–10.5)
Chloride: 103 mEq/L (ref 96–112)
Creatinine, Ser: 1.07 mg/dL (ref 0.40–1.50)
GFR: 89.48 mL/min (ref >60.00)
GLUCOSE: 126 mg/dL — AB (ref 70–99)
POTASSIUM: 5 meq/L (ref 3.5–5.1)
SODIUM: 140 meq/L (ref 135–145)

## 2017-12-21 LAB — PSA: PSA: 2.02 ng/mL (ref 0.10–4.00)

## 2017-12-21 LAB — HEPATIC FUNCTION PANEL
ALT: 35 U/L (ref 0–53)
AST: 26 U/L (ref 0–37)
Albumin: 4.5 g/dL (ref 3.5–5.2)
Alkaline Phosphatase: 73 U/L (ref 39–117)
BILIRUBIN TOTAL: 0.8 mg/dL (ref 0.2–1.2)
Bilirubin, Direct: 0.1 mg/dL (ref 0.0–0.3)
Total Protein: 7.1 g/dL (ref 6.0–8.3)

## 2017-12-21 LAB — TSH: TSH: 1.54 u[IU]/mL (ref 0.35–4.50)

## 2017-12-21 LAB — HEMOGLOBIN A1C: Hgb A1c MFr Bld: 7.3 % — ABNORMAL HIGH (ref 4.6–6.5)

## 2017-12-21 MED ORDER — METFORMIN HCL 500 MG PO TABS: 1000.0000 mg | ORAL_TABLET | Freq: Every day | ORAL | 3 refills | Status: DC

## 2017-12-21 NOTE — Assessment & Plan Note (Signed)

## 2017-12-21 NOTE — Assessment & Plan Note (Signed)
stable overall by history and exam, recent data reviewed with pt, and pt to continue medical treatment as before,  to f/u any worsening symptoms or concerns Lab Results  Component Value Date   HGBA1C 6.5 06/28/2017

## 2017-12-21 NOTE — Telephone Encounter (Signed)
Pt has been informed and expressed understanding.  

## 2017-12-21 NOTE — Assessment & Plan Note (Signed)
stable overall by history and exam, recent data reviewed with pt, and pt to continue medical treatment as before,  to f/u any worsening symptoms or concerns BP Readings from Last 3 Encounters:  12/21/17 122/84  09/23/17 138/88  08/23/17 120/70

## 2017-12-21 NOTE — Telephone Encounter (Signed)
-----   Message from Biagio Borg, MD sent at 12/21/2017 12:34 PM EST ----- Left message on MyChart, pt to cont same tx except  The test results show that your current treatment is OK, except the A1c Is mildly elevated again.  It should be ok to increase the metformin to 3 pills in the AM to get better control  I will send a new prescription and you should hear from the office as well.Redmond Baseman to please inform pt, I will do rx

## 2017-12-21 NOTE — Patient Instructions (Signed)
Please continue all other medications as before, and refills have been done if requested.  Please have the pharmacy call with any other refills you may need.  Please continue your efforts at being more active, low cholesterol diet, and weight control.  You are otherwise up to date with prevention measures today.  Please keep your appointments with your specialists as you may have planned  You will be contacted regarding the referral for: orthopedic  Please go to the LAB in the Basement (turn left off the elevator) for the tests to be done today You will be contacted by phone if any changes need to be made immediately.  Otherwise, you will receive a letter about your results with an explanation, but please check with MyChart first.  Please remember to sign up for MyChart if you have not done so, as this will be important to you in the future with finding out test results, communicating by private email, and scheduling acute appointments online when needed.  Please return in 6 months, or sooner if needed, with Lab testing done 3-5 days before

## 2017-12-21 NOTE — Progress Notes (Signed)
Subjective:    Patient ID: [name], male    DOB: [birth year], 62 y.o.   MRN: [id number]  HPI    Here for wellness and f/u;  Overall doing ok;  Pt denies Chest pain, worsening SOB, DOE, wheezing, orthopnea, PND, worsening LE edema, palpitations, dizziness or syncope.  Pt denies neurological change such as new headache, facial or extremity weakness.  Pt denies polydipsia, polyuria, or low sugar symptoms. Pt states overall good compliance with treatment and medications, good tolerability, and has been trying to follow appropriate diet.  Pt denies worsening depressive symptoms, suicidal ideation or panic. No fever, night sweats, wt loss, loss of appetite, or other constitutional symptoms.  Pt states good ability with ADL's, has low fall risk, home safety reviewed and adequate, no other significant changes in hearing or vision, and only occasionally active with exercise. Overall lost about 180 lbs post gastric bypass.  Has c/o left post heel bone spurring? Trying to come back, had previous surgury for same with Dr Collier Salina, asks for referral to reassess.  No other interval hx or new complaints Past Medical History:  Diagnosis Date  . BACK PAIN   . CAD in native artery    a. moderate by cardiac CT 2016.  Marland Kitchen COLONIC POLYPS, HX OF   . DEGENERATIVE JOINT DISEASE, KNEES, BILATERAL   . Depression   . Diabetes mellitus    type II  . DIVERTICULOSIS, COLON   . DVT, lower extremity ([place])    right  . Hyperlipidemia   . Hypertension   . HYPOGONADISM, MALE   . HYPOTENSION, ORTHOSTATIC   . Kidney stones   . Long term current use of anticoagulant   . Obesity   . Obstructive sleep apnea    uses cpap setting 8 to 10  . Pulmonary emboli ([place])   . Renal insufficiency    a. prior h/o, does not appear chronic.   Past Surgical History:  Procedure Laterality Date  . ankle surgury  2001 bilateral   with bone spurs and torn tendon  . EDG  2008   chronic Duodenitis  . GASTRIC BYPASS    . HEEL SPUR  SURGERY Bilateral 2005  . knee surgury  2000 & 2003    cartilage damage  . LUMBAR LAMINECTOMY/DECOMPRESSION MICRODISCECTOMY  09/28/2012   Procedure: LUMBAR LAMINECTOMY/DECOMPRESSION MICRODISCECTOMY 2 LEVELS;  Surgeon: [name], MD;  Location: [facility];  Service: Orthopedics;  Laterality: Left;  Decompressive laminectomy L4-L5. Microdiscectomy L5-S1  . REPLACEMENT TOTAL KNEE  2013  . TONSILLECTOMY AND ADENOIDECTOMY  age 66 or 35  . TOTAL KNEE ARTHROPLASTY  01/10/2012   Procedure: TOTAL KNEE ARTHROPLASTY;  Surgeon: [name], MD;  Location: [facility];  Service: Orthopedics;  Laterality: Left;    reports that he quit smoking about 39 years ago. He has a 1.25 pack-year smoking history. he has never used smokeless tobacco. He reports that he drinks alcohol. He reports that he does not use drugs. family history includes Diabetes in his brother, sister, sister, and sister; Heart failure in his sister; Hypertension in his mother; Prostate cancer in his father. Allergies  Allergen Reactions  . Adhesive [Tape] Other (See Comments)    Painful, Please use "paper" tape  . Other     NO BLOOD -JEHOVAH'S WITNESS-SIGNED REFUSAL BUT WOULD TAKE ALBUMIN   Current Outpatient Medications on File Prior to Visit  Medication Sig Dispense Refill  . Artificial Tear Ointment (ARTIFICIAL TEARS) ointment Place 1 drop into both eyes as  needed (for dry eyes).    Marland Kitchen aspirin EC 81 MG tablet Take 81 mg by mouth daily.    Marland Kitchen atorvastatin (LIPITOR) 80 MG tablet TAKE 1 TABLET (80 MG TOTAL) BY MOUTH DAILY. (Patient taking differently: TAKE 1 TABLET (80 MG TOTAL) BY MOUTH every evening) 90 tablet 3  . Calcium Carbonate (CALCI-CHEW PO) Take 1 tablet by mouth daily.     . Cyanocobalamin (VITAMIN B-12 SL) Place 1 tablet under the tongue daily.    Marland Kitchen escitalopram (LEXAPRO) 10 MG tablet Take 1 tablet (10 mg total) by mouth daily. 90 tablet 3  . Ferrous Sulfate (IRON) 90 (18 Fe) MG TABS Take 18 mg by mouth daily.    . fluticasone  (FLONASE) 50 MCG/ACT nasal spray Place 2 sprays into both nostrils daily. 16 g 2  . LYRICA 50 MG capsule TAKE ONE CAPSULE BY MOUTH TWICE A DAY 60 capsule 5  . metFORMIN (GLUCOPHAGE) 500 MG tablet Take 1 tablet (500 mg total) by mouth daily with breakfast. 180 tablet 3  . Multiple Vitamin (MULTIVITAMIN WITH MINERALS) TABS tablet Take 1 tablet by mouth daily.    . nitroGLYCERIN (NITROSTAT) 0.4 MG SL tablet Place 1 tablet (0.4 mg total) under the tongue every 5 (five) minutes as needed for chest pain. 90 tablet 3  . polyethylene glycol (MIRALAX / GLYCOLAX) packet Take 17 g by mouth daily as needed for mild constipation.    . senna-docusate (SENNA PLUS) 8.6-50 MG tablet Take 1 tablet by mouth daily as needed for mild constipation. Reported on 03/22/2016    . warfarin (COUMADIN) 10 MG tablet TAKE AS DIRECTED BY ANTICOAGULATION CLINIC. 90 tablet 3  . chlorthalidone (HYGROTON) 25 MG tablet Take 1 tablet (25 mg total) by mouth daily. 30 tablet 11  . lisinopril (PRINIVIL,ZESTRIL) 20 MG tablet Take 1 tablet (20 mg total) by mouth daily. 90 tablet 3   No current facility-administered medications on file prior to visit.    Review of Systems Constitutional: Negative for other unusual diaphoresis, sweats, appetite or weight changes HENT: Negative for other worsening hearing loss, ear pain, facial swelling, mouth sores or neck stiffness.   Eyes: Negative for other worsening pain, redness or other visual disturbance.  Respiratory: Negative for other stridor or swelling Cardiovascular: Negative for other palpitations or other chest pain  Gastrointestinal: Negative for worsening diarrhea or loose stools, blood in stool, distention or other pain Genitourinary: Negative for hematuria, flank pain or other change in urine volume.  Musculoskeletal: Negative for myalgias or other joint swelling.  Skin: Negative for other color change, or other wound or worsening drainage.  Neurological: Negative for other syncope or  numbness. Hematological: Negative for other adenopathy or swelling Psychiatric/Behavioral: Negative for hallucinations, other worsening agitation, SI, self-injury, or new decreased concentration All other system neg per pt    Objective:   Physical Exam BP 122/84   Pulse 69   Temp 97.9 F (36.6 C) (Oral)   Ht 6\' 2"  (1.88 m)   Wt 265 lb (120.2 kg)   SpO2 99%   BMI 34.02 kg/m  VS noted,  Constitutional: Pt is oriented to person, place, and time. Appears well-developed and well-nourished, in no significant distress and comfortable Head: Normocephalic and atraumatic  Eyes: Conjunctivae and EOM are normal. Pupils are equal, round, and reactive to light Right Ear: External ear normal without discharge Left Ear: External ear normal without discharge Nose: Nose without discharge or deformity Mouth/Throat: Oropharynx is without other ulcerations and moist  Neck: Normal range  of motion. Neck supple. No JVD present. No tracheal deviation present or significant neck LA or mass Cardiovascular: Normal rate, regular rhythm, normal heart sounds and intact distal pulses.   Pulmonary/Chest: WOB normal and breath sounds without rales or wheezing  Abdominal: Soft. Bowel sounds are normal. NT. No HSM  Musculoskeletal: Normal range of motion. Exhibits no edema, left post heel with bony enlargement vs achilles insertion site chronic tendinopathy changes Lymphadenopathy: Has no other cervical adenopathy.  Neurological: Pt is alert and oriented to person, place, and time. Pt has normal reflexes. No cranial nerve deficit. Motor grossly intact, Gait intact Skin: Skin is warm and dry. No rash noted or new ulcerations Psychiatric:  Has normal mood and affect. Behavior is normal without agitation No other exam findings  ECHO summary 09-13-13 Study Conclusions Left ventricle: The cavity size was normal. Wall thickness was increased in a pattern of moderate LVH. Systolic function was vigorous. The estimated  ejection fraction was in the range of 65% to 70%. Wall motion was normal; there were no regional wall motion abnormalities.    Assessment & Plan:

## 2017-12-21 NOTE — Assessment & Plan Note (Signed)
[place] for referral to ortho for re evaluation

## 2017-12-29 ENCOUNTER — Ambulatory Visit: Payer: Self-pay | Admitting: Internal Medicine

## 2018-01-06 ENCOUNTER — Ambulatory Visit (INDEPENDENT_AMBULATORY_CARE_PROVIDER_SITE_OTHER): Payer: PPO | Admitting: General Practice

## 2018-01-06 DIAGNOSIS — Z7901 Long term (current) use of anticoagulants: Secondary | ICD-10-CM

## 2018-01-06 LAB — POCT INR: INR: 2.9

## 2018-01-06 NOTE — Patient Instructions (Addendum)
Pre visit review using our clinic review tool, if applicable. No additional management support is needed unless otherwise documented below in the visit note.  Continue to take 1 tablet daily except 2 tablets on Wednesdays.  Re-check in 6 weeks.  

## 2018-01-10 DIAGNOSIS — M79672 Pain in left foot: Secondary | ICD-10-CM | POA: Diagnosis not present

## 2018-02-03 ENCOUNTER — Other Ambulatory Visit: Payer: Self-pay | Admitting: Internal Medicine

## 2018-02-03 MED ORDER — ATORVASTATIN CALCIUM 80 MG PO TABS: ORAL_TABLET | ORAL | 3 refills | Status: DC

## 2018-02-03 NOTE — Telephone Encounter (Signed)
Copied from [place] [id number]. Topic: Quick Communication - Rx Refill/Question >> Feb 03, 2018 10:37 AM Oliver Pila B wrote: Medication: atorvastatin (LIPITOR) 80 MG tablet [[id number]]    Has the patient contacted their pharmacy? Yes.     (Agent: If no, request that the patient contact the pharmacy for the refill.)   Preferred Pharmacy (with phone number or street name): [facility]   Agent: Please be advised that RX refills may take up to 3 business days. We ask that you follow-up with your pharmacy.

## 2018-02-17 ENCOUNTER — Ambulatory Visit (INDEPENDENT_AMBULATORY_CARE_PROVIDER_SITE_OTHER): Payer: PPO | Admitting: General Practice

## 2018-02-17 DIAGNOSIS — Z7901 Long term (current) use of anticoagulants: Secondary | ICD-10-CM | POA: Diagnosis not present

## 2018-02-17 LAB — POCT INR: INR: 2.8

## 2018-02-17 NOTE — Patient Instructions (Addendum)
Pre visit review using our clinic review tool, if applicable. No additional management support is needed unless otherwise documented below in the visit note.  Continue to take 1 tablet daily except 2 tablets on Wednesdays.  Re-check in 6 weeks.  

## 2018-02-18 NOTE — Progress Notes (Signed)
Agree with management.  Stacy J Burns, MD  

## 2018-03-23 ENCOUNTER — Encounter: Payer: Self-pay | Admitting: Internal Medicine

## 2018-03-23 ENCOUNTER — Ambulatory Visit (INDEPENDENT_AMBULATORY_CARE_PROVIDER_SITE_OTHER): Payer: PPO | Admitting: Internal Medicine

## 2018-03-23 VITALS — BP 116/74 | HR 61 | Temp 98.3°F | Ht 74.0 in | Wt 260.0 lb

## 2018-03-23 DIAGNOSIS — E119 Type 2 diabetes mellitus without complications: Secondary | ICD-10-CM | POA: Diagnosis not present

## 2018-03-23 DIAGNOSIS — R6884 Jaw pain: Secondary | ICD-10-CM

## 2018-03-23 DIAGNOSIS — I10 Essential (primary) hypertension: Secondary | ICD-10-CM

## 2018-03-23 MED ORDER — PREDNISONE 10 MG PO TABS: ORAL_TABLET | ORAL | 0 refills | Status: DC

## 2018-03-23 MED ORDER — TRAMADOL HCL 50 MG PO TABS: 50.0000 mg | ORAL_TABLET | Freq: Three times a day (TID) | ORAL | 0 refills | Status: DC | PRN

## 2018-03-23 NOTE — Patient Instructions (Signed)
Please take all new medication as prescribed - the pain medication, and prednisone  You will be contacted regarding the referral for: ENT   Please continue all other medications as before, and refills have been done if requested.  Please have the pharmacy call with any other refills you may need.  Please keep your appointments with your specialists as you may have planned

## 2018-03-23 NOTE — Progress Notes (Signed)
Subjective:    Patient ID: [name], male    DOB: 1956/11/09, 62 y.o.   MRN: [id number]  HPI  Here with acute onset sharp pain mod to severe to right face starting preauricular and involving the right max sinus and angle of jaw area for 2-3 days, seemed to start after chewed some hard candy and had a pop , now worse with further talking, chewing, some better to not do this, nothing else makes better or worse.  No sinus symptoms, ST, cough, fever, chills, other HA.  Pt denies chest pain, increased sob or doe, wheezing, orthopnea, PND, increased LE swelling, palpitations, dizziness or syncope. No hx of TMJ or trigeminal neuralgia  No dental pain.   Pt denies polydipsia, polyuria Past Medical History:  Diagnosis Date  . BACK PAIN   . CAD in native artery    a. moderate by cardiac CT 2016.  Marland Kitchen COLONIC POLYPS, HX OF   . DEGENERATIVE JOINT DISEASE, KNEES, BILATERAL   . Depression   . Diabetes mellitus    type II  . DIVERTICULOSIS, COLON   . DVT, lower extremity ([place])    right  . Hyperlipidemia   . Hypertension   . HYPOGONADISM, MALE   . HYPOTENSION, ORTHOSTATIC   . Kidney stones   . Long term current use of anticoagulant   . Obesity   . Obstructive sleep apnea    uses cpap setting 8 to 10  . Pulmonary emboli ([place])   . Renal insufficiency    a. prior h/o, does not appear chronic.   Past Surgical History:  Procedure Laterality Date  . ankle surgury  2001 bilateral   with bone spurs and torn tendon  . EDG  2008   chronic Duodenitis  . GASTRIC BYPASS    . HEEL SPUR SURGERY Bilateral 2005  . knee surgury  2000 & 2003    cartilage damage  . LUMBAR LAMINECTOMY/DECOMPRESSION MICRODISCECTOMY  09/28/2012   Procedure: LUMBAR LAMINECTOMY/DECOMPRESSION MICRODISCECTOMY 2 LEVELS;  Surgeon: [name], MD;  Location: [facility];  Service: Orthopedics;  Laterality: Left;  Decompressive laminectomy L4-L5. Microdiscectomy L5-S1  . REPLACEMENT TOTAL KNEE  2013  . TONSILLECTOMY AND  ADENOIDECTOMY  age 72 or 45  . TOTAL KNEE ARTHROPLASTY  01/10/2012   Procedure: TOTAL KNEE ARTHROPLASTY;  Surgeon: [name], MD;  Location: [facility];  Service: Orthopedics;  Laterality: Left;    reports that he quit smoking about 39 years ago. He has a 1.25 pack-year smoking history. He has never used smokeless tobacco. He reports that he drinks alcohol. He reports that he does not use drugs. family history includes Diabetes in his brother, sister, sister, and sister; Heart failure in his sister; Hypertension in his mother; Prostate cancer in his father. Allergies  Allergen Reactions  . Adhesive [Tape] Other (See Comments)    Painful, Please use "paper" tape  . Other     NO BLOOD -JEHOVAH'S WITNESS-SIGNED REFUSAL BUT WOULD TAKE ALBUMIN   Current Outpatient Medications on File Prior to Visit  Medication Sig Dispense Refill  . Artificial Tear Ointment (ARTIFICIAL TEARS) ointment Place 1 drop into both eyes as needed (for dry eyes).    Marland Kitchen aspirin EC 81 MG tablet Take 81 mg by mouth daily.    Marland Kitchen atorvastatin (LIPITOR) 80 MG tablet TAKE 1 TABLET (80 MG TOTAL) BY MOUTH DAILY. 90 tablet 3  . Calcium Carbonate (CALCI-CHEW PO) Take 1 tablet by mouth daily.     . Cyanocobalamin (VITAMIN  B-12 SL) Place 1 tablet under the tongue daily.    Marland Kitchen escitalopram (LEXAPRO) 10 MG tablet Take 1 tablet (10 mg total) by mouth daily. 90 tablet 3  . Ferrous Sulfate (IRON) 90 (18 Fe) MG TABS Take 18 mg by mouth daily.    . fluticasone (FLONASE) 50 MCG/ACT nasal spray Place 2 sprays into both nostrils daily. 16 g 2  . LYRICA 50 MG capsule TAKE ONE CAPSULE BY MOUTH TWICE A DAY 60 capsule 5  . metFORMIN (GLUCOPHAGE) 500 MG tablet Take 2 tablets (1,000 mg total) by mouth daily with breakfast. 180 tablet 3  . Multiple Vitamin (MULTIVITAMIN WITH MINERALS) TABS tablet Take 1 tablet by mouth daily.    . nitroGLYCERIN (NITROSTAT) 0.4 MG SL tablet Place 1 tablet (0.4 mg total) under the tongue every 5 (five) minutes as needed for  chest pain. 90 tablet 3  . polyethylene glycol (MIRALAX / GLYCOLAX) packet Take 17 g by mouth daily as needed for mild constipation.    . senna-docusate (SENNA PLUS) 8.6-50 MG tablet Take 1 tablet by mouth daily as needed for mild constipation. Reported on 03/22/2016    . warfarin (COUMADIN) 10 MG tablet TAKE AS DIRECTED BY ANTICOAGULATION CLINIC. 90 tablet 3  . chlorthalidone (HYGROTON) 25 MG tablet Take 1 tablet (25 mg total) by mouth daily. 30 tablet 11  . lisinopril (PRINIVIL,ZESTRIL) 20 MG tablet Take 1 tablet (20 mg total) by mouth daily. 90 tablet 3   No current facility-administered medications on file prior to visit.    Review of Systems  Constitutional: Negative for other unusual diaphoresis or sweats HENT: Negative for ear discharge or swelling Eyes: Negative for other worsening visual disturbances Respiratory: Negative for stridor or other swelling  Gastrointestinal: Negative for worsening distension or other blood Genitourinary: Negative for retention or other urinary change Musculoskeletal: Negative for other MSK pain or swelling Skin: Negative for color change or other new lesions Neurological: Negative for worsening tremors and other numbness  Psychiatric/Behavioral: Negative for worsening agitation or other fatigue All other system neg per pt    Objective:   Physical Exam BP 116/74   Pulse 61   Temp 98.3 F (36.8 C) (Oral)   Ht 6\' 2"  (1.88 m)   Wt 260 lb (117.9 kg)   SpO2 97%   BMI 33.38 kg/m  VS noted, non toxic Constitutional: Pt appears in NAD HENT: Head: NCAT. Except for right preauricular/angle of jaw non discrete swelling and mild tenderness Right Ear: External ear normal.  Left Ear: External ear normal.  Eyes: . Pupils are equal, round, and reactive to light. Conjunctivae and EOM are normal Nose: without d/c or deformity Neck: Neck supple. Gross normal ROM Cardiovascular: Normal rate and regular rhythm.   Pulmonary/Chest: Effort normal and breath  sounds without rales or wheezing.  Abd:  Soft, NT, ND, + BS, no organomegaly Neurological: Pt is alert. At baseline orientation, motor grossly intact, cn 2-12 intact Skin: Skin is warm. No rashes, other new lesions, no LE edema Psychiatric: Pt behavior is normal without agitation  No other exam findings     Assessment & Plan:

## 2018-03-24 NOTE — Assessment & Plan Note (Signed)
stable overall by history and exam, recent data reviewed with pt, and pt to continue medical treatment as before,  to f/u any worsening symptoms or concerns BP Readings from Last 3 Encounters:  03/23/18 116/74  12/21/17 122/84  09/23/17 138/88

## 2018-03-24 NOTE — Assessment & Plan Note (Addendum)
Diff includes TMJ , trigeminal neuralgia but doubt dental or other etiology, for pain control, predpac asd, and ENT referral

## 2018-03-24 NOTE — Assessment & Plan Note (Signed)
stable overall by history and exam, recent data reviewed with pt, and pt to continue medical treatment as before,  to f/u any worsening symptoms or concerns Lab Results  Component Value Date   HGBA1C 7.3 (H) 12/21/2017

## 2018-03-29 ENCOUNTER — Telehealth: Payer: Self-pay | Admitting: Internal Medicine

## 2018-03-29 NOTE — Telephone Encounter (Signed)
I am not aware that dental appts require a referral  I will forward to the [facility] to ask

## 2018-03-29 NOTE — Telephone Encounter (Signed)
Copied from [place] [id number]. Topic: Referral - Request >> Mar 29, 2018  8:15 AM Robina Ade, Helene Kelp D wrote: Reason for CRM: Patient called and would like a referral so he can go see Dr. Edison Pace on [address] for dental appointment. Patient has an appt there today at 10:30 a.m. Please call patient back, thanks.

## 2018-03-29 NOTE — Telephone Encounter (Signed)
Left msg at Dr. Thomes Dinning office to see if they will need a referral from [place]

## 2018-03-31 ENCOUNTER — Ambulatory Visit (INDEPENDENT_AMBULATORY_CARE_PROVIDER_SITE_OTHER): Payer: PPO | Admitting: General Practice

## 2018-03-31 DIAGNOSIS — Z7901 Long term (current) use of anticoagulants: Secondary | ICD-10-CM | POA: Diagnosis not present

## 2018-03-31 LAB — POCT INR: INR: 2.8

## 2018-03-31 NOTE — Patient Instructions (Addendum)
Pre visit review using our clinic review tool, if applicable. No additional management support is needed unless otherwise documented below in the visit note.  Continue to take 1 tablet daily except 2 tablets on Wednesdays.  Re-check in 6 weeks.  

## 2018-04-03 NOTE — Telephone Encounter (Signed)
Called office again and they are going to check and call me back

## 2018-04-20 ENCOUNTER — Ambulatory Visit (INDEPENDENT_AMBULATORY_CARE_PROVIDER_SITE_OTHER): Payer: PPO | Admitting: Ophthalmology

## 2018-04-24 ENCOUNTER — Encounter: Payer: Self-pay | Admitting: Internal Medicine

## 2018-04-24 ENCOUNTER — Encounter (INDEPENDENT_AMBULATORY_CARE_PROVIDER_SITE_OTHER): Payer: PPO | Admitting: Ophthalmology

## 2018-04-24 DIAGNOSIS — H33301 Unspecified retinal break, right eye: Secondary | ICD-10-CM

## 2018-04-24 DIAGNOSIS — I10 Essential (primary) hypertension: Secondary | ICD-10-CM | POA: Diagnosis not present

## 2018-04-24 DIAGNOSIS — H2513 Age-related nuclear cataract, bilateral: Secondary | ICD-10-CM | POA: Diagnosis not present

## 2018-04-24 DIAGNOSIS — H43813 Vitreous degeneration, bilateral: Secondary | ICD-10-CM

## 2018-04-24 DIAGNOSIS — H35033 Hypertensive retinopathy, bilateral: Secondary | ICD-10-CM | POA: Diagnosis not present

## 2018-04-24 LAB — HM DIABETES EYE EXAM

## 2018-05-05 ENCOUNTER — Ambulatory Visit: Payer: Self-pay

## 2018-05-12 ENCOUNTER — Ambulatory Visit (INDEPENDENT_AMBULATORY_CARE_PROVIDER_SITE_OTHER): Payer: PPO | Admitting: General Practice

## 2018-05-12 ENCOUNTER — Ambulatory Visit: Payer: Self-pay

## 2018-05-12 DIAGNOSIS — Z7901 Long term (current) use of anticoagulants: Secondary | ICD-10-CM | POA: Diagnosis not present

## 2018-05-12 LAB — POCT INR: INR: 2.6 (ref 2.0–3.0)

## 2018-05-12 NOTE — Patient Instructions (Addendum)
Pre visit review using our clinic review tool, if applicable. No additional management support is needed unless otherwise documented below in the visit note.  Continue to take 1 tablet daily except 2 tablets on Wednesdays.  Re-check in 6 weeks.  

## 2018-06-20 ENCOUNTER — Ambulatory Visit: Payer: Self-pay | Admitting: Internal Medicine

## 2018-06-23 ENCOUNTER — Ambulatory Visit: Payer: Self-pay | Admitting: Family Medicine

## 2018-06-23 ENCOUNTER — Encounter: Payer: Self-pay | Admitting: Family Medicine

## 2018-06-23 ENCOUNTER — Ambulatory Visit (INDEPENDENT_AMBULATORY_CARE_PROVIDER_SITE_OTHER): Payer: PPO | Admitting: Family Medicine

## 2018-06-23 ENCOUNTER — Ambulatory Visit (INDEPENDENT_AMBULATORY_CARE_PROVIDER_SITE_OTHER): Payer: PPO | Admitting: General Practice

## 2018-06-23 DIAGNOSIS — M1711 Unilateral primary osteoarthritis, right knee: Secondary | ICD-10-CM

## 2018-06-23 DIAGNOSIS — Z7901 Long term (current) use of anticoagulants: Secondary | ICD-10-CM | POA: Diagnosis not present

## 2018-06-23 LAB — POCT INR: INR: 3.5 — AB (ref 2.0–3.0)

## 2018-06-23 MED ORDER — TRAMADOL HCL 50 MG PO TABS: 50.0000 mg | ORAL_TABLET | Freq: Three times a day (TID) | ORAL | 0 refills | Status: DC | PRN

## 2018-06-23 MED ORDER — DICLOFENAC SODIUM 2 % TD SOLN: 1.0000 "application " | Freq: Two times a day (BID) | TRANSDERMAL | 3 refills | Status: DC

## 2018-06-23 NOTE — Assessment & Plan Note (Signed)
Having mild mechanical symptoms that are newly developed. Likely a component of a degenerative meniscal tear. Does have underlying arthritis.  - counseled on supportive care - discussed different injection options and surgery. He will consider his options and let us know what he decides.

## 2018-06-23 NOTE — Patient Instructions (Addendum)
Pre visit review using our clinic review tool, if applicable. No additional management support is needed unless otherwise documented below in the visit note.  Skip coumadin tomorrow and then continue to take 1 tablet daily except 2 tablets on Wednesdays.  Re-check in 4 weeks.

## 2018-06-23 NOTE — Progress Notes (Signed)
[name] - 62 y.o. male MRN [id number]  Date of birth: August 25, 1956  SUBJECTIVE:  Including CC & ROS.  Chief Complaint  Patient presents with  . Right knee pain    [name] is a 62 y.o. male that is presenting with right knee pain. Pain has been increased the past two months. Localized in the medial portion of his knee. Pain is worse during flexion. Walking and standing exacerbate the pain. He has been wearing a compression brace daily. He received Orthovisc injections in 03/19/17, which improved his pain for awhile. Denies injury or surgeries. He has had catching from time to time.   Independent review of the right knee x-ray from 04/11/2017 shows right medial joint space narrowing and a left knee TKA.   Review of Systems  Constitutional: Negative for fever.  HENT: Negative for congestion.   Respiratory: Negative for cough.   Cardiovascular: Negative for chest pain.  Gastrointestinal: Negative for abdominal pain.  Musculoskeletal: Positive for arthralgias.  Skin: Negative for color change.  Neurological: Negative for weakness.  Hematological: Negative for adenopathy.  Psychiatric/Behavioral: Negative for agitation.    HISTORY: Past Medical, Surgical, Social, and Family History Reviewed & Updated per EMR.   Pertinent Historical Findings include:  Past Medical History:  Diagnosis Date  . BACK PAIN   . CAD in native artery    a. moderate by cardiac CT 20-Mar-2015.  Marland Kitchen COLONIC POLYPS, HX OF   . DEGENERATIVE JOINT DISEASE, KNEES, BILATERAL   . Depression   . Diabetes mellitus    type II  . DIVERTICULOSIS, COLON   . DVT, lower extremity ([place])    right  . Hyperlipidemia   . Hypertension   . HYPOGONADISM, MALE   . HYPOTENSION, ORTHOSTATIC   . Kidney stones   . Long term current use of anticoagulant   . Obesity   . Obstructive sleep apnea    uses cpap setting 8 to 10  . Pulmonary emboli ([place])   . Renal insufficiency    a. prior h/o, does not appear chronic.    Past  Surgical History:  Procedure Laterality Date  . ankle surgury  2001 bilateral   with bone spurs and torn tendon  . EDG  03/20/07   chronic Duodenitis  . GASTRIC BYPASS    . HEEL SPUR SURGERY Bilateral 03/19/04  . knee surgury  2000 & Mar 19, 2002    cartilage damage  . LUMBAR LAMINECTOMY/DECOMPRESSION MICRODISCECTOMY  09/28/2012   Procedure: LUMBAR LAMINECTOMY/DECOMPRESSION MICRODISCECTOMY 2 LEVELS;  Surgeon: [name], MD;  Location: [facility];  Service: Orthopedics;  Laterality: Left;  Decompressive laminectomy L4-L5. Microdiscectomy L5-S1  . REPLACEMENT TOTAL KNEE  03-19-12  . TONSILLECTOMY AND ADENOIDECTOMY  age 62 or 63  . TOTAL KNEE ARTHROPLASTY  01/10/2012   Procedure: TOTAL KNEE ARTHROPLASTY;  Surgeon: [name], MD;  Location: [facility];  Service: Orthopedics;  Laterality: Left;    Allergies  Allergen Reactions  . Adhesive [Tape] Other (See Comments)    Painful, Please use "paper" tape  . Other     NO BLOOD -JEHOVAH'S WITNESS-SIGNED REFUSAL BUT WOULD TAKE ALBUMIN    Family History  Problem Relation Age of Onset  . Hypertension Mother   . Prostate cancer Father   . Diabetes Sister        died with DM 03-19-01, 2 more sisters with diabetes  . Heart failure Sister   . Diabetes Brother   . Diabetes Sister   . Diabetes Sister  Social History   Socioeconomic History  . Marital status: Married    Spouse name: Not on file  . Number of children: 2  . Years of education: Not on file  . Highest education level: Not on file  Occupational History  . Occupation: back at work at [employer] since Jan. 2010    Employer: [employer]  . Financial resource strain: Not on file  . Food insecurity:    Worry: Not on file    Inability: Not on file  . Transportation needs:    Medical: Not on file    Non-medical: Not on file  Tobacco Use  . Smoking status: Former Smoker    Packs/day: 0.25    Years: 5.00    Pack years: 1.25    Last attempt to quit: 12/06/1978    Years  since quitting: 39.5  . Smokeless tobacco: Never Used  Substance and Sexual Activity  . Alcohol use: Yes    Alcohol/week: 0.0 oz    Comment: occasional  . Drug use: No  . Sexual activity: Not Currently  Lifestyle  . Physical activity:    Days per week: Not on file    Minutes per session: Not on file  . Stress: Not on file  Relationships  . Social connections:    Talks on phone: Not on file    Gets together: Not on file    Attends religious service: Not on file    Active member of club or organization: Not on file    Attends meetings of clubs or organizations: Not on file    Relationship status: Not on file  . Intimate partner violence:    Fear of current or ex partner: Not on file    Emotionally abused: Not on file    Physically abused: Not on file    Forced sexual activity: Not on file  Other Topics Concern  . Not on file  Social History Narrative   Daily caffeine use one per day   Protein drinks    Live alone   Children; a friend and sister that helps   Brother that is around [place])   HAVE A SON AND DTR IN [place]     PHYSICAL EXAM:  VS: BP 124/74 (BP Location: Left Arm, Patient Position: Sitting, Cuff Size: Normal)   Pulse 77   Ht 6\' 2"  (1.88 m)   Wt 256 lb (116.1 kg)   SpO2 98%   BMI 32.87 kg/m  Physical Exam Gen: NAD, alert, cooperative with exam, well-appearing ENT: normal lips, normal nasal mucosa,  Eye: normal EOM, normal conjunctiva and lids CV:  no edema, +2 pedal pulses   Resp: no accessory muscle use, non-labored,  Skin: no rashes, no areas of induration  Neuro: normal tone, normal sensation to touch Psych:  normal insight, alert and oriented MSK:  Right knee:  No obvious effusion  Normal flexion and extension  Catching with flexion into extension  No instability in varus or valgus testing  Normal gait  No TTP of the medial or lateral joint line  Crepitus with patellar glide  No pain with patellar compression  Neurovascularly intact        ASSESSMENT & PLAN:   Degenerative arthritis of right knee Having mild mechanical symptoms that are newly developed. Likely a component of a degenerative meniscal tear. Does have underlying arthritis.  - counseled on supportive care - discussed different injection options and surgery. He will consider his options and let us  know what he decides.

## 2018-06-23 NOTE — Patient Instructions (Signed)
Nice to meet you  Please try to ice the knee  Please try tylenol for the pain  Please follow up if you would like to try injections.

## 2018-06-28 ENCOUNTER — Ambulatory Visit: Payer: PPO | Admitting: Internal Medicine

## 2018-06-28 DIAGNOSIS — Z0289 Encounter for other administrative examinations: Secondary | ICD-10-CM

## 2018-07-04 ENCOUNTER — Telehealth: Payer: Self-pay | Admitting: Family Medicine

## 2018-07-04 NOTE — Telephone Encounter (Signed)
Copied from [place] [id number]. Topic: General - Other >> Jul 04, 2018  9:50 AM [name] wrote: Reason for CRM: Patient called to request that [name] resend his medication to the pharmacy in [place], [id number] - [address], [address]. [phone] (Phone) [phone] (Fax).  Patient does not remember the name of medication, but it is a cream for his knees that was prescribed by [name].  It was previously sent to a pharmacy in [place] and they called patient to let patient know that the insurance did not cover this medication, but there could be an alternative medication to use.  Please advise.  CB# [phone].

## 2018-07-07 MED ORDER — DICLOFENAC SODIUM 2 % TD SOLN: 1.0000 "application " | Freq: Two times a day (BID) | TRANSDERMAL | 3 refills | Status: DC

## 2018-07-07 NOTE — Telephone Encounter (Signed)
Pt called to check status of having the cream switched over to the correct pharmacy; contact pt to advise

## 2018-07-07 NOTE — Telephone Encounter (Signed)
[facility], patient notified.

## 2018-07-07 NOTE — Addendum Note (Signed)
Addended by: Verlene Mayer A on: 07/07/2018 03:39 PM   Modules accepted: Orders

## 2018-07-08 ENCOUNTER — Other Ambulatory Visit: Payer: Self-pay | Admitting: Physician Assistant

## 2018-07-12 ENCOUNTER — Encounter: Payer: Self-pay | Admitting: Internal Medicine

## 2018-07-12 ENCOUNTER — Other Ambulatory Visit (INDEPENDENT_AMBULATORY_CARE_PROVIDER_SITE_OTHER): Payer: PPO

## 2018-07-12 ENCOUNTER — Telehealth: Payer: Self-pay | Admitting: Internal Medicine

## 2018-07-12 ENCOUNTER — Ambulatory Visit (INDEPENDENT_AMBULATORY_CARE_PROVIDER_SITE_OTHER): Payer: PPO | Admitting: Internal Medicine

## 2018-07-12 VITALS — BP 90/60 | HR 87 | Ht 74.0 in | Wt 261.0 lb

## 2018-07-12 DIAGNOSIS — R972 Elevated prostate specific antigen [PSA]: Secondary | ICD-10-CM

## 2018-07-12 DIAGNOSIS — E785 Hyperlipidemia, unspecified: Secondary | ICD-10-CM | POA: Diagnosis not present

## 2018-07-12 DIAGNOSIS — E119 Type 2 diabetes mellitus without complications: Secondary | ICD-10-CM

## 2018-07-12 DIAGNOSIS — I10 Essential (primary) hypertension: Secondary | ICD-10-CM | POA: Diagnosis not present

## 2018-07-12 LAB — LIPID PANEL
CHOL/HDL RATIO: 3
Cholesterol: 115 mg/dL (ref 0–200)
HDL: 40.1 mg/dL (ref >39.00)
LDL Cholesterol: 49 mg/dL (ref 0–99)
NONHDL: 74.56
Triglycerides: 129 mg/dL (ref 0.0–149.0)
VLDL: 25.8 mg/dL (ref 0.0–40.0)

## 2018-07-12 LAB — HEPATIC FUNCTION PANEL
ALT: 24 U/L (ref 0–53)
AST: 18 U/L (ref 0–37)
Albumin: 4.3 g/dL (ref 3.5–5.2)
Alkaline Phosphatase: 65 U/L (ref 39–117)
BILIRUBIN DIRECT: 0.1 mg/dL (ref 0.0–0.3)
BILIRUBIN TOTAL: 0.4 mg/dL (ref 0.2–1.2)
Total Protein: 6.5 g/dL (ref 6.0–8.3)

## 2018-07-12 LAB — BASIC METABOLIC PANEL
BUN: 26 mg/dL — ABNORMAL HIGH (ref 6–23)
CALCIUM: 9.7 mg/dL (ref 8.4–10.5)
CO2: 27 meq/L (ref 19–32)
Chloride: 109 mEq/L (ref 96–112)
Creatinine, Ser: 1.42 mg/dL (ref 0.40–1.50)
GFR: 64.44 mL/min (ref >60.00)
GLUCOSE: 144 mg/dL — AB (ref 70–99)
Potassium: 4.9 mEq/L (ref 3.5–5.1)
SODIUM: 142 meq/L (ref 135–145)

## 2018-07-12 LAB — HEMOGLOBIN A1C: Hgb A1c MFr Bld: 7 % — ABNORMAL HIGH (ref 4.6–6.5)

## 2018-07-12 LAB — PSA: PSA: 2.23 ng/mL (ref 0.10–4.00)

## 2018-07-12 NOTE — Telephone Encounter (Signed)
Patient wanted to make sure that his atorvastatin (LIPITOR) 80 MG tablet directions were updated and the correct amount for a 90 day supply be sent to [facility].

## 2018-07-12 NOTE — Assessment & Plan Note (Signed)
Mild, asympt, for f/u psa with labs today

## 2018-07-12 NOTE — Progress Notes (Signed)
Subjective:    Patient ID: [name], male    DOB: 1956/05/27, 62 y.o.   MRN: [id number]  HPI  Here to f/u; overall doing ok,  Pt denies chest pain, increasing sob or doe, wheezing, orthopnea, PND, increased LE swelling, palpitations, dizziness or syncope.  Pt denies new neurological symptoms such as new headache, or facial or extremity weakness or numbness.  Pt denies polydipsia, polyuria, or low sugar episode.  Pt states overall good compliance with meds, mostly trying to follow appropriate diet, with wt overall stable,  but little exercise however. No other new compalints.  Denies urinary symptoms such as dysuria, frequency, urgency, flank pain, hematuria or n/v, fever, chills Wt Readings from Last 3 Encounters:  07/12/18 261 lb (118.4 kg)  06/23/18 256 lb (116.1 kg)  03/23/18 260 lb (117.9 kg)   BP Readings from Last 3 Encounters:  07/12/18 90/60  06/23/18 124/74  03/23/18 116/74   Past Medical History:  Diagnosis Date  . BACK PAIN   . CAD in native artery    a. moderate by cardiac CT 2016.  Marland Kitchen COLONIC POLYPS, HX OF   . DEGENERATIVE JOINT DISEASE, KNEES, BILATERAL   . Depression   . Diabetes mellitus    type II  . DIVERTICULOSIS, COLON   . DVT, lower extremity ([place])    right  . Hyperlipidemia   . Hypertension   . HYPOGONADISM, MALE   . HYPOTENSION, ORTHOSTATIC   . Kidney stones   . Long term current use of anticoagulant   . Obesity   . Obstructive sleep apnea    uses cpap setting 8 to 10  . Pulmonary emboli ([place])   . Renal insufficiency    a. prior h/o, does not appear chronic.   Past Surgical History:  Procedure Laterality Date  . ankle surgury  2001 bilateral   with bone spurs and torn tendon  . EDG  2008   chronic Duodenitis  . GASTRIC BYPASS    . HEEL SPUR SURGERY Bilateral 2005  . knee surgury  2000 & 2003    cartilage damage  . LUMBAR LAMINECTOMY/DECOMPRESSION MICRODISCECTOMY  09/28/2012   Procedure: LUMBAR LAMINECTOMY/DECOMPRESSION  MICRODISCECTOMY 2 LEVELS;  Surgeon: [name], MD;  Location: [facility];  Service: Orthopedics;  Laterality: Left;  Decompressive laminectomy L4-L5. Microdiscectomy L5-S1  . REPLACEMENT TOTAL KNEE  2013  . TONSILLECTOMY AND ADENOIDECTOMY  age 58 or 32  . TOTAL KNEE ARTHROPLASTY  01/10/2012   Procedure: TOTAL KNEE ARTHROPLASTY;  Surgeon: [name], MD;  Location: [facility];  Service: Orthopedics;  Laterality: Left;    reports that he quit smoking about 39 years ago. He has a 1.25 pack-year smoking history. He has never used smokeless tobacco. He reports that he drinks alcohol. He reports that he does not use drugs. family history includes Diabetes in his brother, sister, sister, and sister; Heart failure in his sister; Hypertension in his mother; Prostate cancer in his father. Allergies  Allergen Reactions  . Adhesive [Tape] Other (See Comments)    Painful, Please use "paper" tape  . Other     NO BLOOD -JEHOVAH'S WITNESS-SIGNED REFUSAL BUT WOULD TAKE ALBUMIN   Current Outpatient Medications on File Prior to Visit  Medication Sig Dispense Refill  . Artificial Tear Ointment (ARTIFICIAL TEARS) ointment Place 1 drop into both eyes as needed (for dry eyes).    Marland Kitchen aspirin EC 81 MG tablet Take 81 mg by mouth daily.    Marland Kitchen atorvastatin (LIPITOR) 80 MG  tablet TAKE 1 TABLET (80 MG TOTAL) BY MOUTH DAILY. 90 tablet 3  . Calcium Carbonate (CALCI-CHEW PO) Take 1 tablet by mouth daily.     . Cyanocobalamin (VITAMIN B-12 SL) Place 1 tablet under the tongue daily.    . Diclofenac Sodium (PENNSAID) 2 % SOLN Place 1 application onto the skin 2 (two) times daily. 1 Bottle 3  . escitalopram (LEXAPRO) 10 MG tablet Take 1 tablet (10 mg total) by mouth daily. 90 tablet 3  . Ferrous Sulfate (IRON) 90 (18 Fe) MG TABS Take 18 mg by mouth daily.    . fluticasone (FLONASE) 50 MCG/ACT nasal spray Place 2 sprays into both nostrils daily. 16 g 2  . lisinopril (PRINIVIL,ZESTRIL) 20 MG tablet Take 1 tablet (20 mg total) by  mouth daily. 30 tablet 0  . LYRICA 50 MG capsule TAKE ONE CAPSULE BY MOUTH TWICE A DAY 60 capsule 5  . metFORMIN (GLUCOPHAGE) 500 MG tablet Take 2 tablets (1,000 mg total) by mouth daily with breakfast. 180 tablet 3  . Multiple Vitamin (MULTIVITAMIN WITH MINERALS) TABS tablet Take 1 tablet by mouth daily.    . nitroGLYCERIN (NITROSTAT) 0.4 MG SL tablet Place 1 tablet (0.4 mg total) under the tongue every 5 (five) minutes as needed for chest pain. 90 tablet 3  . polyethylene glycol (MIRALAX / GLYCOLAX) packet Take 17 g by mouth daily as needed for mild constipation.    . senna-docusate (SENNA PLUS) 8.6-50 MG tablet Take 1 tablet by mouth daily as needed for mild constipation. Reported on 03/22/2016    . traMADol (ULTRAM) 50 MG tablet Take 1 tablet (50 mg total) by mouth every 8 (eight) hours as needed. 30 tablet 0  . warfarin (COUMADIN) 10 MG tablet TAKE AS DIRECTED BY ANTICOAGULATION CLINIC. 90 tablet 3  . chlorthalidone (HYGROTON) 25 MG tablet Take 1 tablet (25 mg total) by mouth daily. 30 tablet 11   No current facility-administered medications on file prior to visit.    Review of Systems  Constitutional: Negative for other unusual diaphoresis or sweats HENT: Negative for ear discharge or swelling Eyes: Negative for other worsening visual disturbances Respiratory: Negative for stridor or other swelling  Gastrointestinal: Negative for worsening distension or other blood Genitourinary: Negative for retention or other urinary change Musculoskeletal: Negative for other MSK pain or swelling Skin: Negative for color change or other new lesions Neurological: Negative for worsening tremors and other numbness  Psychiatric/Behavioral: Negative for worsening agitation or other fatigue All other system neg per pt    Objective:   Physical Exam BP 90/60 (BP Location: Left Arm, Patient Position: Sitting, Cuff Size: Large)   Pulse 87   Ht 6\' 2"  (1.88 m)   Wt 261 lb (118.4 kg)   SpO2 94%   BMI 33.51  kg/m  VS noted,  Constitutional: Pt appears in NAD HENT: Head: NCAT.  Right Ear: External ear normal.  Left Ear: External ear normal.  Eyes: . Pupils are equal, round, and reactive to light. Conjunctivae and EOM are normal Nose: without d/c or deformity Neck: Neck supple. Gross normal ROM Cardiovascular: Normal rate and regular rhythm.   Pulmonary/Chest: Effort normal and breath sounds without rales or wheezing.  Abd:  Soft, NT, ND, + BS, no organomegaly Neurological: Pt is alert. At baseline orientation, motor grossly intact Skin: Skin is warm. No rashes, other new lesions, no LE edema Psychiatric: Pt behavior is normal without agitation  No other exam findings   Lab Results  Component Value Date  WBC 6.6 12/21/2017   HGB 13.7 12/21/2017   HCT 44.7 12/21/2017   PLT 177.0 12/21/2017   GLUCOSE 126 (H) 12/21/2017   CHOL 144 12/21/2017   TRIG 104.0 12/21/2017   HDL 41.10 12/21/2017   LDLDIRECT 121.2 05/25/2011   LDLCALC 83 12/21/2017   ALT 35 12/21/2017   AST 26 12/21/2017   NA 140 12/21/2017   K 5.0 12/21/2017   CL 103 12/21/2017   CREATININE 1.07 12/21/2017   BUN 24 (H) 12/21/2017   CO2 31 12/21/2017   TSH 1.54 12/21/2017   PSA 2.02 12/21/2017   INR 3.5 (A) 06/23/2018   HGBA1C 7.3 (H) 12/21/2017   MICROALBUR <0.7 12/21/2017       Assessment & Plan:

## 2018-07-12 NOTE — Assessment & Plan Note (Signed)
Low normal today but asympt, stable overall by history and exam, recent data reviewed with pt, and pt to continue medical treatment as before,  to f/u any worsening symptoms or concerns

## 2018-07-12 NOTE — Assessment & Plan Note (Signed)
stable overall by history and exam, recent data reviewed with pt, and pt to continue medical treatment as before,  to f/u any worsening symptoms or concerns, for f/u lab today 

## 2018-07-12 NOTE — Assessment & Plan Note (Signed)
Lab Results  Component Value Date   LDLCALC 83 12/21/2017  stable overall by history and exam, recent data reviewed with pt, and pt to continue medical treatment as before,  to f/u any worsening symptoms or concerns

## 2018-07-12 NOTE — Patient Instructions (Signed)

## 2018-07-13 NOTE — Telephone Encounter (Signed)
On Feb 03 2018 he had a rx for #90 with 3 refill sent to pharmacy  So unless he changed pharmacies, it does not appear he needs a new rx

## 2018-07-18 ENCOUNTER — Ambulatory Visit (INDEPENDENT_AMBULATORY_CARE_PROVIDER_SITE_OTHER): Payer: PPO | Admitting: General Practice

## 2018-07-18 DIAGNOSIS — Z7901 Long term (current) use of anticoagulants: Secondary | ICD-10-CM

## 2018-07-18 LAB — POCT INR: INR: 2.4 (ref 2.0–3.0)

## 2018-07-18 NOTE — Patient Instructions (Addendum)
Pre visit review using our clinic review tool, if applicable. No additional management support is needed unless otherwise documented below in the visit note.  Continue to take 1 tablet daily except 2 tablets on Wednesdays.  Re-check in 4 weeks.  

## 2018-07-31 ENCOUNTER — Other Ambulatory Visit: Payer: Self-pay | Admitting: Internal Medicine

## 2018-08-02 ENCOUNTER — Other Ambulatory Visit: Payer: Self-pay | Admitting: Physician Assistant

## 2018-08-08 ENCOUNTER — Other Ambulatory Visit: Payer: Self-pay | Admitting: Cardiology

## 2018-08-12 ENCOUNTER — Other Ambulatory Visit: Payer: Self-pay | Admitting: Internal Medicine

## 2018-08-15 ENCOUNTER — Telehealth: Payer: Self-pay | Admitting: Internal Medicine

## 2018-08-15 ENCOUNTER — Ambulatory Visit (INDEPENDENT_AMBULATORY_CARE_PROVIDER_SITE_OTHER): Payer: PPO | Admitting: General Practice

## 2018-08-15 DIAGNOSIS — Z7901 Long term (current) use of anticoagulants: Secondary | ICD-10-CM

## 2018-08-15 LAB — POCT INR: INR: 2.1 (ref 2.0–3.0)

## 2018-08-15 NOTE — Telephone Encounter (Signed)
Patient was in the office to see Jenny Reichmann and asked for a refill on his LYRICA 50 MG capsule and senna-docusate (SENNA PLUS) 8.6-50 MG tablet sent to [facility].

## 2018-08-15 NOTE — Patient Instructions (Addendum)
Pre visit review using our clinic review tool, if applicable. No additional management support is needed unless otherwise documented below in the visit note.  Continue to take 1 tablet daily except 2 tablets on Wednesdays.  Re-check in 4 weeks.  

## 2018-08-16 MED ORDER — PREGABALIN 50 MG PO CAPS: 50.0000 mg | ORAL_CAPSULE | Freq: Two times a day (BID) | ORAL | 1 refills | Status: DC

## 2018-08-16 MED ORDER — SENNOSIDES-DOCUSATE SODIUM 8.6-50 MG PO TABS: 1.0000 | ORAL_TABLET | Freq: Every day | ORAL | 1 refills | Status: DC | PRN

## 2018-08-16 NOTE — Telephone Encounter (Signed)
MD approved and sent electronically to [facility]  

## 2018-08-16 NOTE — Telephone Encounter (Signed)
Done erx 

## 2018-08-16 NOTE — Telephone Encounter (Signed)
Sent senna plus pls advise on lyrica.Marland KitchenJohny Chess

## 2018-08-17 ENCOUNTER — Ambulatory Visit (INDEPENDENT_AMBULATORY_CARE_PROVIDER_SITE_OTHER): Payer: PPO | Admitting: Internal Medicine

## 2018-08-17 ENCOUNTER — Encounter: Payer: Self-pay | Admitting: Internal Medicine

## 2018-08-17 VITALS — BP 102/66 | HR 66 | Temp 97.7°F | Ht 74.0 in | Wt 260.0 lb

## 2018-08-17 DIAGNOSIS — E119 Type 2 diabetes mellitus without complications: Secondary | ICD-10-CM | POA: Diagnosis not present

## 2018-08-17 DIAGNOSIS — J069 Acute upper respiratory infection, unspecified: Secondary | ICD-10-CM | POA: Diagnosis not present

## 2018-08-17 DIAGNOSIS — I10 Essential (primary) hypertension: Secondary | ICD-10-CM | POA: Diagnosis not present

## 2018-08-17 MED ORDER — HYDROCODONE-HOMATROPINE 5-1.5 MG/5ML PO SYRP: 5.0000 mL | ORAL_SOLUTION | Freq: Four times a day (QID) | ORAL | 0 refills | Status: AC | PRN

## 2018-08-17 MED ORDER — AZITHROMYCIN 250 MG PO TABS: ORAL_TABLET | ORAL | 1 refills | Status: DC

## 2018-08-17 NOTE — Assessment & Plan Note (Signed)
stable overall by history and exam, recent data reviewed with pt, and pt to continue medical treatment as before,  to f/u any worsening symptoms or concerns  

## 2018-08-17 NOTE — Progress Notes (Signed)
Subjective:    Patient ID: [name], male    DOB: [birth year], 62 y.o.   MRN: [id number]  HPI  Here with 2-3 days acute onset fever, facial pain, pressure, headache, general weakness and malaise, and greenish d/c, with mild ST and cough, but pt denies chest pain, wheezing, increased sob or doe, orthopnea, PND, increased LE swelling, palpitations, dizziness or syncope.  Pt denies new neurological symptoms such as new headache, or facial or extremity weakness or numbness   Pt denies polydipsia, polyuria No other new complaints Past Medical History:  Diagnosis Date  . BACK PAIN   . CAD in native artery    a. moderate by cardiac CT 2016.  Marland Kitchen COLONIC POLYPS, HX OF   . DEGENERATIVE JOINT DISEASE, KNEES, BILATERAL   . Depression   . Diabetes mellitus    type II  . DIVERTICULOSIS, COLON   . DVT, lower extremity ([place])    right  . Hyperlipidemia   . Hypertension   . HYPOGONADISM, MALE   . HYPOTENSION, ORTHOSTATIC   . Kidney stones   . Long term current use of anticoagulant   . Obesity   . Obstructive sleep apnea    uses cpap setting 8 to 10  . Pulmonary emboli ([place])   . Renal insufficiency    a. prior h/o, does not appear chronic.   Past Surgical History:  Procedure Laterality Date  . ankle surgury  2001 bilateral   with bone spurs and torn tendon  . EDG  2008   chronic Duodenitis  . GASTRIC BYPASS    . HEEL SPUR SURGERY Bilateral 2005  . knee surgury  2000 & 2003    cartilage damage  . LUMBAR LAMINECTOMY/DECOMPRESSION MICRODISCECTOMY  09/28/2012   Procedure: LUMBAR LAMINECTOMY/DECOMPRESSION MICRODISCECTOMY 2 LEVELS;  Surgeon: [name], MD;  Location: [facility];  Service: Orthopedics;  Laterality: Left;  Decompressive laminectomy L4-L5. Microdiscectomy L5-S1  . REPLACEMENT TOTAL KNEE  2013  . TONSILLECTOMY AND ADENOIDECTOMY  age 37 or 46  . TOTAL KNEE ARTHROPLASTY  01/10/2012   Procedure: TOTAL KNEE ARTHROPLASTY;  Surgeon: [name], MD;  Location: [facility];  Service:  Orthopedics;  Laterality: Left;    reports that he quit smoking about 39 years ago. He has a 1.25 pack-year smoking history. He has never used smokeless tobacco. He reports that he drinks alcohol. He reports that he does not use drugs. family history includes Diabetes in his brother, sister, sister, and sister; Heart failure in his sister; Hypertension in his mother; Prostate cancer in his father. Allergies  Allergen Reactions  . Adhesive [Tape] Other (See Comments)    Painful, Please use "paper" tape  . Other     NO BLOOD -JEHOVAH'S WITNESS-SIGNED REFUSAL BUT WOULD TAKE ALBUMIN   Current Outpatient Medications on File Prior to Visit  Medication Sig Dispense Refill  . Artificial Tear Ointment (ARTIFICIAL TEARS) ointment Place 1 drop into both eyes as needed (for dry eyes).    Marland Kitchen aspirin EC 81 MG tablet Take 81 mg by mouth daily.    Marland Kitchen atorvastatin (LIPITOR) 80 MG tablet TAKE 1 TABLET (80 MG TOTAL) BY MOUTH DAILY. 90 tablet 3  . Calcium Carbonate (CALCI-CHEW PO) Take 1 tablet by mouth daily.     . chlorthalidone (HYGROTON) 25 MG tablet Take 1 tablet (25 mg total) by mouth daily. Please make overdue yearly appt with Dr. Meda Coffee before anymore refills. 1st attempt 30 tablet 0  . Cyanocobalamin (VITAMIN B-12 SL) Place 1  tablet under the tongue daily.    . Diclofenac Sodium (PENNSAID) 2 % SOLN Place 1 application onto the skin 2 (two) times daily. 1 Bottle 3  . escitalopram (LEXAPRO) 10 MG tablet Take 1 tablet (10 mg total) by mouth daily. 90 tablet 3  . Ferrous Sulfate (IRON) 90 (18 Fe) MG TABS Take 18 mg by mouth daily.    . fluticasone (FLONASE) 50 MCG/ACT nasal spray Place 2 sprays into both nostrils daily. 16 g 2  . lisinopril (PRINIVIL,ZESTRIL) 20 MG tablet Take 1 tablet (20 mg total) by mouth daily. Please make overdue yearly appt with Dr. Meda Coffee before anymore refills. 1st attempt 30 tablet 0  . metFORMIN (GLUCOPHAGE) 500 MG tablet Take 2 tablets (1,000 mg total) by mouth daily with  breakfast. 180 tablet 3  . Multiple Vitamin (MULTIVITAMIN WITH MINERALS) TABS tablet Take 1 tablet by mouth daily.    . nitroGLYCERIN (NITROSTAT) 0.4 MG SL tablet Place 1 tablet (0.4 mg total) under the tongue every 5 (five) minutes as needed for chest pain. 90 tablet 3  . polyethylene glycol (MIRALAX / GLYCOLAX) packet Take 17 g by mouth daily as needed for mild constipation.    . pregabalin (LYRICA) 50 MG capsule Take 1 capsule (50 mg total) by mouth 2 (two) times daily. 180 capsule 1  . senna-docusate (SENNA PLUS) 8.6-50 MG tablet Take 1 tablet by mouth daily as needed for mild constipation. Reported on 03/22/2016 90 tablet 1  . traMADol (ULTRAM) 50 MG tablet Take 1 tablet (50 mg total) by mouth every 8 (eight) hours as needed. 30 tablet 0  . warfarin (COUMADIN) 10 MG tablet Take 1 tablet daily except 2 tablets on Wed or Take as directed by anticoagulation clinic.  90 Day 120 tablet 1   No current facility-administered medications on file prior to visit.    Review of Systems  Constitutional: Negative for other unusual diaphoresis or sweats HENT: Negative for ear discharge or swelling Eyes: Negative for other worsening visual disturbances Respiratory: Negative for stridor or other swelling  Gastrointestinal: Negative for worsening distension or other blood Genitourinary: Negative for retention or other urinary change Musculoskeletal: Negative for other MSK pain or swelling Skin: Negative for color change or other new lesions Neurological: Negative for worsening tremors and other numbness  Psychiatric/Behavioral: Negative for worsening agitation or other fatigue All other system neg per pt    Objective:   Physical Exam BP 102/66   Pulse 66   Temp 97.7 F (36.5 C) (Oral)   Ht 6\' 2"  (1.88 m)   Wt 260 lb (117.9 kg)   SpO2 96%   BMI 33.38 kg/m  VS noted, mild ill Constitutional: Pt appears in NAD HENT: Head: NCAT.  Right Ear: External ear normal.  Left Ear: External ear normal.    Bilat tm's with mild erythema.  Max sinus areas mild tender.  Pharynx with mild erythema, no exudate Eyes: . Pupils are equal, round, and reactive to light. Conjunctivae and EOM are normal Nose: without d/c or deformity Neck: Neck supple. Gross normal ROM Cardiovascular: Normal rate and regular rhythm.   Pulmonary/Chest: Effort normal and breath sounds without rales or wheezing.  Neurological: Pt is alert. At baseline orientation, motor grossly intact Skin: Skin is warm. No rashes, other new lesions, no LE edema Psychiatric: Pt behavior is normal without agitation  No other exam findings Lab Results  Component Value Date   WBC 6.6 12/21/2017   HGB 13.7 12/21/2017   HCT 44.7 12/21/2017  PLT 177.0 12/21/2017   GLUCOSE 144 (H) 07/12/2018   CHOL 115 07/12/2018   TRIG 129.0 07/12/2018   HDL 40.10 07/12/2018   LDLDIRECT 121.2 05/25/2011   LDLCALC 49 07/12/2018   ALT 24 07/12/2018   AST 18 07/12/2018   NA 142 07/12/2018   K 4.9 07/12/2018   CL 109 07/12/2018   CREATININE 1.42 07/12/2018   BUN 26 (H) 07/12/2018   CO2 27 07/12/2018   TSH 1.54 12/21/2017   PSA 2.23 07/12/2018   INR 2.1 08/15/2018   HGBA1C 7.0 (H) 07/12/2018   MICROALBUR <0.7 12/21/2017       Assessment & Plan:

## 2018-08-17 NOTE — Assessment & Plan Note (Signed)
Mild to mod, for antibx course,  to f/u any worsening symptoms or concerns 

## 2018-08-17 NOTE — Patient Instructions (Signed)
Please take all new medication as prescribed - the antibiotic, and cough medicine as needed  Please continue all other medications as before, and refills have been done if requested.  Please have the pharmacy call with any other refills you may need.  Please continue your efforts at being more active, low cholesterol diabetic diet, and weight control  Please keep your appointments with your specialists as you may have planned

## 2018-08-29 ENCOUNTER — Other Ambulatory Visit: Payer: Self-pay | Admitting: Physician Assistant

## 2018-09-04 ENCOUNTER — Other Ambulatory Visit: Payer: Self-pay | Admitting: Pharmacist

## 2018-09-04 NOTE — Patient Outreach (Signed)
[facility]) Care Management  09/04/2018  STEPHANOS FAN [birth year] [id number]   Outreach call to Amie Critchley regarding his request for follow up from the [facility] Medication Adherence Campaign. Speak with patient. HIPAA identifiers verified and verbal consent received.  Mr. Ahart reports that he takes his metformin 500 mg - 2 tablets each morning with breakfast as directed. Denies any missed doses or barriers to adherence. Reports that his PCP did change his dose a few months ago to have him take 3 tablets/day, but he was unable to tolerate this dose due to low blood sugars. Reports that he is able to tolerate his current dose without any side effects.  Denies any further medication questions/concerns. Will close pharmacy episode at this time.  Harlow Asa, PharmD, [facility] [phone]

## 2018-09-07 ENCOUNTER — Other Ambulatory Visit: Payer: Self-pay | Admitting: Cardiology

## 2018-09-09 ENCOUNTER — Other Ambulatory Visit: Payer: Self-pay | Admitting: Physician Assistant

## 2018-09-09 ENCOUNTER — Other Ambulatory Visit: Payer: Self-pay | Admitting: Cardiology

## 2018-09-11 NOTE — Telephone Encounter (Signed)
Outpatient Medication Detail    Disp Refills Start End   chlorthalidone (HYGROTON) 25 MG tablet 15 tablet 0 09/07/2018    Sig - Route: Take 1 tablet (25 mg total) by mouth daily. Please schedule appt for anymore refills. 2nd attempt. - Oral   Sent to pharmacy as: chlorthalidone (HYGROTON) 25 MG tablet   E-Prescribing Status: Receipt confirmed by pharmacy (09/07/2018 12:06 PM EDT)   Pharmacy   [id number] - [place], [place].

## 2018-09-18 ENCOUNTER — Telehealth: Payer: Self-pay

## 2018-09-18 MED ORDER — LISINOPRIL 20 MG PO TABS: 20.0000 mg | ORAL_TABLET | Freq: Every day | ORAL | 2 refills | Status: DC

## 2018-09-18 NOTE — Telephone Encounter (Signed)
Copied from [place] [phone]. Topic: General - Other >> Sep 18, 2018  9:21 AM Carolyn Stare wrote:  Pt call to say Dr Jenny Reichmann has been the one giving him the below med and is asking for refill.   lisinopril (PRINIVIL,ZESTRIL) 20 MG tablet   [facility] [address]

## 2018-09-19 ENCOUNTER — Ambulatory Visit (INDEPENDENT_AMBULATORY_CARE_PROVIDER_SITE_OTHER): Payer: PPO | Admitting: General Practice

## 2018-09-19 DIAGNOSIS — Z23 Encounter for immunization: Secondary | ICD-10-CM | POA: Diagnosis not present

## 2018-09-19 DIAGNOSIS — Z7901 Long term (current) use of anticoagulants: Secondary | ICD-10-CM

## 2018-09-19 LAB — POCT INR: INR: 2.8 (ref 2.0–3.0)

## 2018-09-19 NOTE — Patient Instructions (Addendum)
Pre visit review using our clinic review tool, if applicable. No additional management support is needed unless otherwise documented below in the visit note.  Continue to take 1 tablet daily except 2 tablets on Wednesdays.  Re-check in 6 weeks.  

## 2018-09-22 ENCOUNTER — Other Ambulatory Visit: Payer: Self-pay | Admitting: Cardiology

## 2018-09-25 NOTE — Progress Notes (Addendum)
Subjective:   [name] is a 62 y.o. male who presents for Medicare Annual/Subsequent preventive examination.  Review of Systems:  No ROS.  Medicare Wellness Visit. Additional risk factors are reflected in the social history.  Cardiac Risk Factors include: advanced age (>74men, >25 women);diabetes mellitus;dyslipidemia;hypertension;male gender Sleep patterns: feels rested on waking, gets up 1 times nightly to void and sleeps 7-8 hours nightly.    Home Safety/Smoke Alarms: Feels safe in home. Smoke alarms in place.  Living environment; residence and Firearm Safety: 1-story house/ trailer, no firearms Lives with wife, no needs for DME, good support system. Seat Belt Safety/Bike Helmet: Wears seat belt.   PSA-  Lab Results  Component Value Date   PSA 2.23 07/12/2018   PSA 2.02 12/21/2017   PSA 0.75 06/28/2017       Objective:    Vitals: BP 116/62   Pulse (!) 56   Resp 17   Ht 6\' 2"  (1.88 m)   Wt 260 lb (117.9 kg)   SpO2 99%   BMI 33.38 kg/m   Body mass index is 33.38 kg/m.  Advanced Directives 09/26/2018 09/23/2017 06/28/2016 04/17/2016 12/21/2015 05/16/2015 04/22/2015  Does Patient Have a Medical Advance Directive? No No No No No No Yes  Does patient want to make changes to medical advance directive? No - Patient declined - - - - - Yes - information given  Would patient like information on creating a medical advance directive? - Yes (ED - Information included in AVS) Yes - Educational materials given No - patient declined information No - patient declined information - -  Pre-existing out of facility DNR order (yellow form or pink MOST form) - - - - - - -    Tobacco Social History   Tobacco Use  Smoking Status Former Smoker  . Packs/day: 0.25  . Years: 5.00  . Pack years: 1.25  . Last attempt to quit: 12/06/1978  . Years since quitting: 39.8  Smokeless Tobacco Never Used     Counseling given: Not Answered  Past Medical History:  Diagnosis Date  . BACK PAIN     . CAD in native artery    a. moderate by cardiac CT 2016.  Marland Kitchen COLONIC POLYPS, HX OF   . DEGENERATIVE JOINT DISEASE, KNEES, BILATERAL   . Depression   . Diabetes mellitus    type II  . DIVERTICULOSIS, COLON   . DVT, lower extremity ([place])    right  . Hyperlipidemia   . Hypertension   . HYPOGONADISM, MALE   . HYPOTENSION, ORTHOSTATIC   . Kidney stones   . Long term current use of anticoagulant   . Obesity   . Obstructive sleep apnea    uses cpap setting 8 to 10  . Pulmonary emboli ([place])   . Renal insufficiency    a. prior h/o, does not appear chronic.   Past Surgical History:  Procedure Laterality Date  . ankle surgury  2001 bilateral   with bone spurs and torn tendon  . EDG  2008   chronic Duodenitis  . GASTRIC BYPASS    . HEEL SPUR SURGERY Bilateral 2005  . knee surgury  2000 & 2003    cartilage damage  . LUMBAR LAMINECTOMY/DECOMPRESSION MICRODISCECTOMY  09/28/2012   Procedure: LUMBAR LAMINECTOMY/DECOMPRESSION MICRODISCECTOMY 2 LEVELS;  Surgeon: [name], MD;  Location: [facility];  Service: Orthopedics;  Laterality: Left;  Decompressive laminectomy L4-L5. Microdiscectomy L5-S1  . REPLACEMENT TOTAL KNEE  2013  . TONSILLECTOMY AND ADENOIDECTOMY  age 55 or 62  . TOTAL KNEE ARTHROPLASTY  01/10/2012   Procedure: TOTAL KNEE ARTHROPLASTY;  Surgeon: [name], MD;  Location: [facility];  Service: Orthopedics;  Laterality: Left;   Family History  Problem Relation Age of Onset  . Hypertension Mother   . Prostate cancer Father   . Diabetes Sister        died with DM 03-18-01, 2 more sisters with diabetes  . Heart failure Sister   . Diabetes Brother   . Diabetes Sister   . Diabetes Sister    Social History   Socioeconomic History  . Marital status: Married    Spouse name: Not on file  . Number of children: 2  . Years of education: Not on file  . Highest education level: Not on file  Occupational History  . Occupation: back at work at [employer] since Jan.  2010    Employer: [employer]  . Financial resource strain: Not hard at all  . Food insecurity:    Worry: Never true    Inability: Never true  . Transportation needs:    Medical: No    Non-medical: No  Tobacco Use  . Smoking status: Former Smoker    Packs/day: 0.25    Years: 5.00    Pack years: 1.25    Last attempt to quit: 12/06/1978    Years since quitting: 39.8  . Smokeless tobacco: Never Used  Substance and Sexual Activity  . Alcohol use: Yes    Alcohol/week: 0.0 standard drinks    Comment: occasional  . Drug use: No  . Sexual activity: Not Currently  Lifestyle  . Physical activity:    Days per week: 4 days    Minutes per session: 40 min  . Stress: Not at all  Relationships  . Social connections:    Talks on phone: More than three times a week    Gets together: More than three times a week    Attends religious service: Never    Active member of club or organization: Yes    Attends meetings of clubs or organizations: More than 4 times per year    Relationship status: Married  Other Topics Concern  . Not on file  Social History Narrative   Daily caffeine use one per day   Protein drinks    Children; a friend and sister that helps   Brother that is around [place])   [place]    Outpatient Encounter Medications as of 09/26/2018  Medication Sig  . Artificial Tear Ointment (ARTIFICIAL TEARS) ointment Place 1 drop into both eyes as needed (for dry eyes).  Marland Kitchen aspirin EC 81 MG tablet Take 81 mg by mouth daily.  Marland Kitchen atorvastatin (LIPITOR) 80 MG tablet TAKE 1 TABLET (80 MG TOTAL) BY MOUTH DAILY.  . Calcium Carbonate (CALCI-CHEW PO) Take 1 tablet by mouth daily.   . chlorthalidone (HYGROTON) 25 MG tablet Take 1 tablet (25 mg total) by mouth daily. Patient must call and schedule an appt for further refills. 3rd/ final attempt.  . Cyanocobalamin (VITAMIN B-12 SL) Place 1 tablet under the tongue daily.  . Diclofenac Sodium (PENNSAID) 2 % SOLN  Place 1 application onto the skin 2 (two) times daily.  Marland Kitchen escitalopram (LEXAPRO) 10 MG tablet Take 1 tablet (10 mg total) by mouth daily.  . Ferrous Sulfate (IRON) 90 (18 Fe) MG TABS Take 18 mg by mouth daily.  . fluticasone (FLONASE) 50 MCG/ACT nasal  spray Place 2 sprays into both nostrils daily.  Marland Kitchen lisinopril (PRINIVIL,ZESTRIL) 20 MG tablet Take 1 tablet (20 mg total) by mouth daily.  . metFORMIN (GLUCOPHAGE) 500 MG tablet Take 2 tablets (1,000 mg total) by mouth daily with breakfast.  . Multiple Vitamin (MULTIVITAMIN WITH MINERALS) TABS tablet Take 1 tablet by mouth daily.  . nitroGLYCERIN (NITROSTAT) 0.4 MG SL tablet Place 1 tablet (0.4 mg total) under the tongue every 5 (five) minutes as needed for chest pain.  . polyethylene glycol (MIRALAX / GLYCOLAX) packet Take 17 g by mouth daily as needed for mild constipation.  . pregabalin (LYRICA) 50 MG capsule Take 1 capsule (50 mg total) by mouth 2 (two) times daily.  Marland Kitchen senna-docusate (SENNA PLUS) 8.6-50 MG tablet Take 1 tablet by mouth daily as needed for mild constipation. Reported on 03/22/2016  . traMADol (ULTRAM) 50 MG tablet Take 1 tablet (50 mg total) by mouth every 8 (eight) hours as needed.  . warfarin (COUMADIN) 10 MG tablet Take 1 tablet daily except 2 tablets on Wed or Take as directed by anticoagulation clinic.  90 Day  . [DISCONTINUED] chlorthalidone (HYGROTON) 25 MG tablet Take 1 tablet (25 mg total) by mouth daily. Patient must call and schedule an appt for further refills. 3rd/ final attempt.  . [DISCONTINUED] azithromycin (ZITHROMAX Z-PAK) 250 MG tablet 2 tab by mouth day 1, then 1 per day (Patient not taking: Reported on 09/26/2018)   No facility-administered encounter medications on file as of 09/26/2018.     Activities of Daily Living In your present state of health, do you have any difficulty performing the following activities: 09/26/2018  Hearing? N  Vision? N  Difficulty concentrating or making decisions? N  Walking or  climbing stairs? N  Dressing or bathing? N  Doing errands, shopping? N  Preparing Food and eating ? N  Using the Toilet? N  In the past six months, have you accidently leaked urine? N  Do you have problems with loss of bowel control? N  Managing your Medications? N  Managing your Finances? N  Housekeeping or managing your Housekeeping? N  Some recent data might be hidden    Patient Care Team: Biagio Borg, MD as PCP - Geoffry Paradise, MD as Consulting Physician (Physical Medicine and Rehabilitation) Eldridge Abrahams, MD as Referring Physician (Surgical Oncology) Renato Shin, MD as Consulting Physician (Endocrinology)   Assessment:   This is a routine wellness examination for Rachid. Physical assessment deferred to PCP.   Exercise Activities and Dietary recommendations Current Exercise Habits: Home exercise routine;Structured exercise class, Type of exercise: walking;strength training/weights;calisthenics, Time (Minutes): 45, Frequency (Times/Week): 3, Weekly Exercise (Minutes/Week): 135  Diet (meal preparation, eat out, water intake, caffeinated beverages, dairy products, fruits and vegetables): in general, a "healthy" diet  , well balanced   Reviewed heart healthy and diabetic diet Encouraged patient to increase daily water and healthy fluid intake. Relevant patient education assigned to patient using Emmi.  Goals    . Be as healthy as possible and continue to lose weight     Continue to exercise and eat healthy.    . Exercise 3x per week (30 min per time)     Hopes to stay active; will focus on keeping back strong; to fup with PT regarding exercises recommended to keep the back strong. Golf for fun     . Patient Stated     I want to go to the [place], start swimming and do water workouts.     Marland Kitchen  Stay as healthy as possible and maintain my weight     . Weight (lb) < 225 lb (102.1 kg)     Continue with healthy regiment; still seeing weight loss on current  regiment; Every 2 months approx 2 lbs;        Fall Risk Fall Risk  09/26/2018 12/21/2017 09/23/2017 06/28/2017 06/28/2016  Falls in the past year? No No No No No    Depression Screen PHQ 2/9 Scores 09/26/2018 12/21/2017 09/23/2017 06/28/2017  PHQ - 2 Score 0 0 0 0    Cognitive Function MMSE - Mini Mental State Exam 09/23/2017 06/28/2016  Not completed: - (No Data)  Orientation to time 5 -  Orientation to Place 5 -  Registration 3 -  Attention/ Calculation 5 -  Recall 2 -  Language- name 2 objects 2 -  Language- repeat 1 -  Language- follow 3 step command 3 -  Language- read & follow direction 1 -  Write a sentence 1 -  Copy design 1 -  Total score 29 -        Immunization History  Administered Date(s) Administered  . Influenza Split 08/19/2011, 10/30/2012  . Influenza Whole 09/21/2005, 09/02/2008, 12/24/2009  . Influenza,inj,Quad PF,6+ Mos 09/13/2013, 09/30/2014, 10/29/2015, 11/15/2016, 09/23/2017, 09/19/2018  . Pneumococcal Conjugate-13 03/13/2014  . Pneumococcal Polysaccharide-23 03/04/2008, 06/28/2016  . Td 03/11/2009  . Zoster 10/29/2015   Screening Tests Health Maintenance  Topic Date Due  . HEMOGLOBIN A1C  01/12/2019  . TETANUS/TDAP  03/12/2019  . OPHTHALMOLOGY EXAM  04/25/2019  . FOOT EXAM  07/13/2019  . COLONOSCOPY  03/18/2020  . INFLUENZA VACCINE  Completed  . Hepatitis C Screening  Completed  . HIV Screening  Completed      Plan:     Continue doing brain stimulating activities (puzzles, reading, adult coloring books, staying active) to keep memory sharp.   Continue to eat heart healthy diet (full of fruits, vegetables, whole grains, lean protein, water--limit salt, fat, and sugar intake) and increase physical activity as tolerated.  I have personally reviewed and noted the following in the patient's chart:   . Medical and social history . Use of alcohol, tobacco or illicit drugs  . Current medications and supplements . Functional ability and  status . Nutritional status . Physical activity . Advanced directives . List of other physicians . Vitals . Screenings to include cognitive, depression, and falls . Referrals and appointments  In addition, I have reviewed and discussed with patient certain preventive protocols, quality metrics, and best practice recommendations. A written personalized care plan for preventive services as well as general preventive health recommendations were provided to patient.     Michiel Cowboy, RN  09/26/2018    Medical screening examination/treatment/procedure(s) were performed by non-physician practitioner and as supervising physician I was immediately available for consultation/collaboration. I agree with above. Binnie Rail, MD

## 2018-09-26 ENCOUNTER — Ambulatory Visit (INDEPENDENT_AMBULATORY_CARE_PROVIDER_SITE_OTHER): Payer: PPO | Admitting: *Deleted

## 2018-09-26 VITALS — BP 116/62 | HR 56 | Resp 17 | Ht 74.0 in | Wt 260.0 lb

## 2018-09-26 DIAGNOSIS — Z0000 Encounter for general adult medical examination without abnormal findings: Secondary | ICD-10-CM

## 2018-09-26 MED ORDER — CHLORTHALIDONE 25 MG PO TABS: 25.0000 mg | ORAL_TABLET | Freq: Every day | ORAL | 0 refills | Status: DC

## 2018-09-26 NOTE — Patient Instructions (Addendum)
Continue doing brain stimulating activities (puzzles, reading, adult coloring books, staying active) to keep memory sharp.   Continue to eat heart healthy diet (full of fruits, vegetables, whole grains, lean protein, water--limit salt, fat, and sugar intake) and increase physical activity as tolerated.   [name] , Thank you for taking time to come for your Medicare Wellness Visit. I appreciate your ongoing commitment to your health goals. Please review the following plan we discussed and let me know if I can assist you in the future.   These are the goals we discussed: Goals    . Be as healthy as possible and continue to lose weight     Continue to exercise and eat healthy.    . Exercise 3x per week (30 min per time)     Hopes to stay active; will focus on keeping back strong; to fup with PT regarding exercises recommended to keep the back strong. Golf for fun     . Patient Stated     I want to go to the [place], start swimming and do water workouts.     . Stay as healthy as possible and maintain my weight     . Weight (lb) < 225 lb (102.1 kg)     Continue with healthy regiment; still seeing weight loss on current regiment; Every 2 months approx 2 lbs;        This is a list of the screening recommended for you and due dates:  Health Maintenance  Topic Date Due  . Hemoglobin A1C  01/12/2019  . Tetanus Vaccine  03/12/2019  . Eye exam for diabetics  04/25/2019  . Complete foot exam   07/13/2019  . Colon Cancer Screening  03/18/2020  . Flu Shot  Completed  .  Hepatitis C: One time screening is recommended by Center for Disease Control  (CDC) for  adults born from [birth year] through 1967.   Completed  . HIV Screening  Completed    Health Maintenance, Male A healthy lifestyle and preventive care is important for your health and wellness. Ask your health care provider about what schedule of regular examinations is right for you. What should I know about weight and diet? Eat a  Healthy Diet  Eat plenty of vegetables, fruits, whole grains, low-fat dairy products, and lean protein.  Do not eat a lot of foods high in solid fats, added sugars, or salt.  Maintain a Healthy Weight Regular exercise can help you achieve or maintain a healthy weight. You should:  Do at least 150 minutes of exercise each week. The exercise should increase your heart rate and make you sweat (moderate-intensity exercise).  Do strength-training exercises at least twice a week.  Watch Your Levels of Cholesterol and Blood Lipids  Have your blood tested for lipids and cholesterol every 5 years starting at 64 years of age. If you are at high risk for heart disease, you should start having your blood tested when you are 65 years old. You may need to have your cholesterol levels checked more often if: ? Your lipid or cholesterol levels are high. ? You are older than 64 years of age. ? You are at high risk for heart disease.  What should I know about cancer screening? Many types of cancers can be detected early and may often be prevented. Lung Cancer  You should be screened every year for lung cancer if: ? You are a current smoker who has smoked for at least 30 years. ?  You are a former smoker who has quit within the past 15 years.  Talk to your health care provider about your screening options, when you should start screening, and how often you should be screened.  Colorectal Cancer  Routine colorectal cancer screening usually begins at 64 years of age and should be repeated every 5-10 years until you are 64 years old. You may need to be screened more often if early forms of precancerous polyps or small growths are found. Your health care provider may recommend screening at an earlier age if you have risk factors for colon cancer.  Your health care provider may recommend using home test kits to check for hidden blood in the stool.  A small camera at the end of a tube can be used to examine  your colon (sigmoidoscopy or colonoscopy). This checks for the earliest forms of colorectal cancer.  Prostate and Testicular Cancer  Depending on your age and overall health, your health care provider may do certain tests to screen for prostate and testicular cancer.  Talk to your health care provider about any symptoms or concerns you have about testicular or prostate cancer.  Skin Cancer  Check your skin from head to toe regularly.  Tell your health care provider about any new moles or changes in moles, especially if: ? There is a change in a mole's size, shape, or color. ? You have a mole that is larger than a pencil eraser.  Always use sunscreen. Apply sunscreen liberally and repeat throughout the day.  Protect yourself by wearing long sleeves, pants, a wide-brimmed hat, and sunglasses when outside.  What should I know about heart disease, diabetes, and high blood pressure?  If you are 16-48 years of age, have your blood pressure checked every 3-5 years. If you are 104 years of age or older, have your blood pressure checked every year. You should have your blood pressure measured twice-once when you are at a hospital or clinic, and once when you are not at a hospital or clinic. Record the average of the two measurements. To check your blood pressure when you are not at a hospital or clinic, you can use: ? An automated blood pressure machine at a pharmacy. ? A home blood pressure monitor.  Talk to your health care provider about your target blood pressure.  If you are between 55-58 years old, ask your health care provider if you should take aspirin to prevent heart disease.  Have regular diabetes screenings by checking your fasting blood sugar level. ? If you are at a normal weight and have a low risk for diabetes, have this test once every three years after the age of 29. ? If you are overweight and have a high risk for diabetes, consider being tested at a younger age or more  often.  A one-time screening for abdominal aortic aneurysm (AAA) by ultrasound is recommended for men aged 40-75 years who are current or former smokers. What should I know about preventing infection? Hepatitis B If you have a higher risk for hepatitis B, you should be screened for this virus. Talk with your health care provider to find out if you are at risk for hepatitis B infection. Hepatitis C Blood testing is recommended for:  Everyone born from [birth year] through 1967.  Anyone with known risk factors for hepatitis C.  Sexually Transmitted Diseases (STDs)  You should be screened each year for STDs including gonorrhea and chlamydia if: ? You are sexually active and are  younger than 64 years of age. ? You are older than 64 years of age and your health care provider tells you that you are at risk for this type of infection. ? Your sexual activity has changed since you were last screened and you are at an increased risk for chlamydia or gonorrhea. Ask your health care provider if you are at risk.  Talk with your health care provider about whether you are at high risk of being infected with HIV. Your health care provider may recommend a prescription medicine to help prevent HIV infection.  What else can I do?  Schedule regular health, dental, and eye exams.  Stay current with your vaccines (immunizations).  Do not use any tobacco products, such as cigarettes, chewing tobacco, and e-cigarettes. If you need help quitting, ask your health care provider.  Limit alcohol intake to no more than 2 drinks per day. One drink equals 12 ounces of beer, 5 ounces of wine, or 1 ounces of hard liquor.  Do not use street drugs.  Do not share needles.  Ask your health care provider for help if you need support or information about quitting drugs.  Tell your health care provider if you often feel depressed.  Tell your health care provider if you have ever been abused or do not feel safe at  home. This information is not intended to replace advice given to you by your health care provider. Make sure you discuss any questions you have with your health care provider. Document Released: 05/20/2008 Document Revised: 07/21/2016 Document Reviewed: 08/26/2015 Elsevier Interactive Patient Education  [employer].

## 2018-10-10 ENCOUNTER — Other Ambulatory Visit: Payer: Self-pay | Admitting: Internal Medicine

## 2018-10-31 ENCOUNTER — Ambulatory Visit (INDEPENDENT_AMBULATORY_CARE_PROVIDER_SITE_OTHER): Payer: PPO | Admitting: General Practice

## 2018-10-31 DIAGNOSIS — Z7901 Long term (current) use of anticoagulants: Secondary | ICD-10-CM

## 2018-10-31 LAB — POCT INR: INR: 2.3 (ref 2.0–3.0)

## 2018-10-31 NOTE — Patient Instructions (Addendum)
Pre visit review using our clinic review tool, if applicable. No additional management support is needed unless otherwise documented below in the visit note.  Continue to take 1 tablet daily except 2 tablets on Wednesdays.  Re-check in 6 weeks.  

## 2018-11-22 ENCOUNTER — Encounter (HOSPITAL_COMMUNITY): Payer: Self-pay | Admitting: *Deleted

## 2018-11-22 ENCOUNTER — Emergency Department (HOSPITAL_COMMUNITY)
Admission: EM | Admit: 2018-11-22 | Discharge: 2018-11-22 | Disposition: A | Discharge status: Home / Self Care | Payer: PPO | Attending: Emergency Medicine | Admitting: Emergency Medicine

## 2018-11-22 ENCOUNTER — Emergency Department (HOSPITAL_COMMUNITY): Payer: PPO

## 2018-11-22 DIAGNOSIS — E119 Type 2 diabetes mellitus without complications: Secondary | ICD-10-CM | POA: Diagnosis not present

## 2018-11-22 DIAGNOSIS — S161XXA Strain of muscle, fascia and tendon at neck level, initial encounter: Secondary | ICD-10-CM | POA: Diagnosis not present

## 2018-11-22 DIAGNOSIS — Y9389 Activity, other specified: Secondary | ICD-10-CM | POA: Diagnosis not present

## 2018-11-22 DIAGNOSIS — S199XXA Unspecified injury of neck, initial encounter: Secondary | ICD-10-CM | POA: Diagnosis not present

## 2018-11-22 DIAGNOSIS — I10 Essential (primary) hypertension: Secondary | ICD-10-CM | POA: Insufficient documentation

## 2018-11-22 DIAGNOSIS — M545 Low back pain: Secondary | ICD-10-CM | POA: Diagnosis not present

## 2018-11-22 DIAGNOSIS — R52 Pain, unspecified: Secondary | ICD-10-CM | POA: Diagnosis not present

## 2018-11-22 DIAGNOSIS — R51 Headache: Secondary | ICD-10-CM | POA: Diagnosis present

## 2018-11-22 DIAGNOSIS — Z7982 Long term (current) use of aspirin: Secondary | ICD-10-CM | POA: Insufficient documentation

## 2018-11-22 DIAGNOSIS — Y999 Unspecified external cause status: Secondary | ICD-10-CM | POA: Diagnosis not present

## 2018-11-22 DIAGNOSIS — Y92410 Unspecified street and highway as the place of occurrence of the external cause: Secondary | ICD-10-CM | POA: Diagnosis not present

## 2018-11-22 DIAGNOSIS — S0990XA Unspecified injury of head, initial encounter: Secondary | ICD-10-CM | POA: Diagnosis not present

## 2018-11-22 DIAGNOSIS — Z79899 Other long term (current) drug therapy: Secondary | ICD-10-CM | POA: Insufficient documentation

## 2018-11-22 DIAGNOSIS — S3992XA Unspecified injury of lower back, initial encounter: Secondary | ICD-10-CM | POA: Diagnosis not present

## 2018-11-22 MED ORDER — METHOCARBAMOL 500 MG PO TABS: 500.0000 mg | ORAL_TABLET | Freq: Two times a day (BID) | ORAL | 0 refills | Status: DC

## 2018-11-22 MED ORDER — ACETAMINOPHEN 325 MG PO TABS
650.0000 mg | ORAL_TABLET | Freq: Once | ORAL | Status: AC
  Administered 2018-11-22: 650 mg via ORAL
  Filled 2018-11-22: qty 2

## 2018-11-22 NOTE — ED Notes (Signed)
Pt to CT
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
## 2018-11-22 NOTE — Discharge Instructions (Addendum)
Please read attached information. If you experience any new or worsening signs or symptoms please return to the emergency room for evaluation. Please follow-up with your primary care provider or specialist as discussed. Please use medication prescribed only as directed and discontinue taking if you have any concerning signs or symptoms.   °

## 2018-11-27 ENCOUNTER — Observation Stay (HOSPITAL_COMMUNITY)
Admission: EM | Admit: 2018-11-27 | Discharge: 2018-11-28 | Disposition: A | Discharge status: Home / Self Care | Payer: PPO | Attending: Internal Medicine | Admitting: Internal Medicine

## 2018-11-27 ENCOUNTER — Encounter (HOSPITAL_COMMUNITY): Payer: Self-pay | Admitting: *Deleted

## 2018-11-27 ENCOUNTER — Other Ambulatory Visit: Payer: Self-pay

## 2018-11-27 ENCOUNTER — Emergency Department (HOSPITAL_COMMUNITY): Payer: PPO

## 2018-11-27 DIAGNOSIS — E1122 Type 2 diabetes mellitus with diabetic chronic kidney disease: Secondary | ICD-10-CM

## 2018-11-27 DIAGNOSIS — E785 Hyperlipidemia, unspecified: Secondary | ICD-10-CM | POA: Diagnosis present

## 2018-11-27 DIAGNOSIS — Z7901 Long term (current) use of anticoagulants: Secondary | ICD-10-CM | POA: Diagnosis not present

## 2018-11-27 DIAGNOSIS — M549 Dorsalgia, unspecified: Secondary | ICD-10-CM | POA: Diagnosis not present

## 2018-11-27 DIAGNOSIS — R51 Headache: Secondary | ICD-10-CM | POA: Diagnosis not present

## 2018-11-27 DIAGNOSIS — J309 Allergic rhinitis, unspecified: Secondary | ICD-10-CM

## 2018-11-27 DIAGNOSIS — I2510 Atherosclerotic heart disease of native coronary artery without angina pectoris: Secondary | ICD-10-CM | POA: Diagnosis not present

## 2018-11-27 DIAGNOSIS — I129 Hypertensive chronic kidney disease with stage 1 through stage 4 chronic kidney disease, or unspecified chronic kidney disease: Secondary | ICD-10-CM | POA: Diagnosis not present

## 2018-11-27 DIAGNOSIS — E119 Type 2 diabetes mellitus without complications: Secondary | ICD-10-CM | POA: Insufficient documentation

## 2018-11-27 DIAGNOSIS — R0789 Other chest pain: Principal | ICD-10-CM | POA: Insufficient documentation

## 2018-11-27 DIAGNOSIS — Z23 Encounter for immunization: Secondary | ICD-10-CM | POA: Insufficient documentation

## 2018-11-27 DIAGNOSIS — N179 Acute kidney failure, unspecified: Secondary | ICD-10-CM | POA: Diagnosis not present

## 2018-11-27 DIAGNOSIS — N189 Chronic kidney disease, unspecified: Secondary | ICD-10-CM | POA: Insufficient documentation

## 2018-11-27 DIAGNOSIS — I10 Essential (primary) hypertension: Secondary | ICD-10-CM | POA: Diagnosis present

## 2018-11-27 DIAGNOSIS — Z7984 Long term (current) use of oral hypoglycemic drugs: Secondary | ICD-10-CM | POA: Insufficient documentation

## 2018-11-27 DIAGNOSIS — Z96652 Presence of left artificial knee joint: Secondary | ICD-10-CM | POA: Diagnosis not present

## 2018-11-27 DIAGNOSIS — Z87891 Personal history of nicotine dependence: Secondary | ICD-10-CM | POA: Diagnosis not present

## 2018-11-27 DIAGNOSIS — Z79899 Other long term (current) drug therapy: Secondary | ICD-10-CM | POA: Diagnosis not present

## 2018-11-27 DIAGNOSIS — R079 Chest pain, unspecified: Secondary | ICD-10-CM | POA: Diagnosis not present

## 2018-11-27 DIAGNOSIS — N183 Chronic kidney disease, stage 3 (moderate): Secondary | ICD-10-CM

## 2018-11-27 DIAGNOSIS — I2699 Other pulmonary embolism without acute cor pulmonale: Secondary | ICD-10-CM | POA: Diagnosis present

## 2018-11-27 DIAGNOSIS — F32A Depression, unspecified: Secondary | ICD-10-CM | POA: Diagnosis present

## 2018-11-27 DIAGNOSIS — Z7982 Long term (current) use of aspirin: Secondary | ICD-10-CM | POA: Insufficient documentation

## 2018-11-27 DIAGNOSIS — F329 Major depressive disorder, single episode, unspecified: Secondary | ICD-10-CM | POA: Diagnosis present

## 2018-11-27 LAB — CBC
HCT: 42.3 % (ref 39.0–52.0)
Hemoglobin: 12.7 g/dL — ABNORMAL LOW (ref 13.0–17.0)
MCH: 21.9 pg — AB (ref 26.0–34.0)
MCHC: 30 g/dL (ref 30.0–36.0)
MCV: 73.1 fL — ABNORMAL LOW (ref 80.0–100.0)
Platelets: 187 10*3/uL (ref 150–400)
RBC: 5.79 MIL/uL (ref 4.22–5.81)
RDW: 15.8 % — ABNORMAL HIGH (ref 11.5–15.5)
WBC: 7.9 10*3/uL (ref 4.0–10.5)
nRBC: 0 % (ref 0.0–0.2)

## 2018-11-27 LAB — TROPONIN I: Troponin I: <0.03 ng/mL (ref <0.03)

## 2018-11-27 LAB — BASIC METABOLIC PANEL
Anion gap: 10 (ref 5–15)
BUN: 29 mg/dL — ABNORMAL HIGH (ref 8–23)
CO2: 23 mmol/L (ref 22–32)
CREATININE: 1.65 mg/dL — AB (ref 0.61–1.24)
Calcium: 9.1 mg/dL (ref 8.9–10.3)
Chloride: 106 mmol/L (ref 98–111)
GFR calc Af Amer: 50 mL/min — ABNORMAL LOW (ref >60)
GFR calc non Af Amer: 43 mL/min — ABNORMAL LOW (ref >60)
Glucose, Bld: 119 mg/dL — ABNORMAL HIGH (ref 70–99)
Potassium: 5.6 mmol/L — ABNORMAL HIGH (ref 3.5–5.1)
Sodium: 139 mmol/L (ref 135–145)

## 2018-11-27 LAB — PROTIME-INR
INR: 2.23
Prothrombin Time: 24.4 seconds — ABNORMAL HIGH (ref 11.4–15.2)
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
## 2018-11-28 ENCOUNTER — Encounter (HOSPITAL_COMMUNITY): Payer: Self-pay | Admitting: Physician Assistant

## 2018-11-28 ENCOUNTER — Observation Stay (HOSPITAL_BASED_OUTPATIENT_CLINIC_OR_DEPARTMENT_OTHER): Payer: PPO

## 2018-11-28 ENCOUNTER — Other Ambulatory Visit: Payer: Self-pay

## 2018-11-28 DIAGNOSIS — E785 Hyperlipidemia, unspecified: Secondary | ICD-10-CM

## 2018-11-28 DIAGNOSIS — Z7901 Long term (current) use of anticoagulants: Secondary | ICD-10-CM

## 2018-11-28 DIAGNOSIS — E1122 Type 2 diabetes mellitus with diabetic chronic kidney disease: Secondary | ICD-10-CM

## 2018-11-28 DIAGNOSIS — R079 Chest pain, unspecified: Secondary | ICD-10-CM

## 2018-11-28 DIAGNOSIS — N179 Acute kidney failure, unspecified: Secondary | ICD-10-CM | POA: Diagnosis not present

## 2018-11-28 DIAGNOSIS — I2782 Chronic pulmonary embolism: Secondary | ICD-10-CM | POA: Diagnosis not present

## 2018-11-28 DIAGNOSIS — I10 Essential (primary) hypertension: Secondary | ICD-10-CM | POA: Diagnosis not present

## 2018-11-28 DIAGNOSIS — N183 Chronic kidney disease, stage 3 (moderate): Secondary | ICD-10-CM

## 2018-11-28 DIAGNOSIS — E782 Mixed hyperlipidemia: Secondary | ICD-10-CM | POA: Diagnosis not present

## 2018-11-28 DIAGNOSIS — I25119 Atherosclerotic heart disease of native coronary artery with unspecified angina pectoris: Secondary | ICD-10-CM

## 2018-11-28 DIAGNOSIS — I2510 Atherosclerotic heart disease of native coronary artery without angina pectoris: Secondary | ICD-10-CM | POA: Diagnosis not present

## 2018-11-28 LAB — ECHOCARDIOGRAM COMPLETE
Height: 74 in
Weight: 3968 oz

## 2018-11-28 LAB — GLUCOSE, CAPILLARY
GLUCOSE-CAPILLARY: 135 mg/dL — AB (ref 70–99)
Glucose-Capillary: 106 mg/dL — ABNORMAL HIGH (ref 70–99)

## 2018-11-28 LAB — MRSA PCR SCREENING: MRSA by PCR: NEGATIVE

## 2018-11-28 LAB — TROPONIN I
Troponin I: <0.03 ng/mL (ref <0.03)
Troponin I: <0.03 ng/mL (ref <0.03)
Troponin I: <0.03 ng/mL (ref <0.03)

## 2018-11-28 LAB — HIV ANTIBODY (ROUTINE TESTING W REFLEX): HIV Screen 4th Generation wRfx: NONREACTIVE

## 2018-11-28 MED ORDER — WARFARIN - PHARMACIST DOSING INPATIENT: Freq: Every day | Status: DC

## 2018-11-28 MED ORDER — PREGABALIN 50 MG PO CAPS: 50.0000 mg | ORAL_CAPSULE | Freq: Every day | ORAL | Status: DC

## 2018-11-28 MED ORDER — FLUTICASONE PROPIONATE 50 MCG/ACT NA SUSP: 2.0000 | Freq: Every day | NASAL | Status: DC | PRN

## 2018-11-28 MED ORDER — ACETAMINOPHEN 325 MG PO TABS: 650.0000 mg | ORAL_TABLET | ORAL | Status: DC | PRN

## 2018-11-28 MED ORDER — METHOCARBAMOL 500 MG PO TABS: 500.0000 mg | ORAL_TABLET | Freq: Two times a day (BID) | ORAL | Status: DC | PRN

## 2018-11-28 MED ORDER — DICLOFENAC SODIUM 2 % TD SOLN: 1.0000 "application " | Freq: Two times a day (BID) | TRANSDERMAL | Status: DC | PRN

## 2018-11-28 MED ORDER — ASPIRIN 81 MG PO CHEW
324.0000 mg | CHEWABLE_TABLET | Freq: Once | ORAL | Status: AC
  Administered 2018-11-28: 324 mg via ORAL
  Filled 2018-11-28: qty 4

## 2018-11-28 MED ORDER — TRAMADOL HCL 50 MG PO TABS
50.0000 mg | ORAL_TABLET | Freq: Three times a day (TID) | ORAL | Status: DC | PRN
  Administered 2018-11-28: 50 mg via ORAL
  Filled 2018-11-28: qty 1

## 2018-11-28 MED ORDER — NITROGLYCERIN 0.4 MG SL SUBL
0.4000 mg | SUBLINGUAL_TABLET | SUBLINGUAL | Status: DC | PRN
  Administered 2018-11-28: 0.4 mg via SUBLINGUAL
  Filled 2018-11-28: qty 1

## 2018-11-28 MED ORDER — ASPIRIN EC 81 MG PO TBEC: 81.0000 mg | DELAYED_RELEASE_TABLET | Freq: Every day | ORAL | Status: DC

## 2018-11-28 MED ORDER — SENNOSIDES-DOCUSATE SODIUM 8.6-50 MG PO TABS: 1.0000 | ORAL_TABLET | Freq: Every day | ORAL | Status: DC | PRN

## 2018-11-28 MED ORDER — ATORVASTATIN CALCIUM 80 MG PO TABS: 80.0000 mg | ORAL_TABLET | Freq: Every evening | ORAL | Status: DC

## 2018-11-28 MED ORDER — ESCITALOPRAM OXALATE 10 MG PO TABS
10.0000 mg | ORAL_TABLET | Freq: Every day | ORAL | Status: DC
  Administered 2018-11-28: 10 mg via ORAL
  Filled 2018-11-28: qty 1

## 2018-11-28 MED ORDER — NITROGLYCERIN 0.4 MG SL SUBL: 0.4000 mg | SUBLINGUAL_TABLET | SUBLINGUAL | Status: DC | PRN

## 2018-11-28 MED ORDER — WARFARIN SODIUM 10 MG PO TABS: 10.0000 mg | ORAL_TABLET | Freq: Once | ORAL | Status: DC

## 2018-11-28 MED ORDER — PNEUMOCOCCAL VAC POLYVALENT 25 MCG/0.5ML IJ INJ
0.5000 mL | INJECTION | INTRAMUSCULAR | Status: AC
  Administered 2018-11-28: 0.5 mL via INTRAMUSCULAR
  Filled 2018-11-28: qty 0.5

## 2018-11-28 MED ORDER — WARFARIN SODIUM 10 MG PO TABS: 10.0000 mg | ORAL_TABLET | ORAL | Status: DC

## 2018-11-28 MED ORDER — METFORMIN HCL 500 MG PO TABS
1000.0000 mg | ORAL_TABLET | Freq: Every day | ORAL | Status: DC
  Administered 2018-11-28: 1000 mg via ORAL
  Filled 2018-11-28: qty 2

## 2018-11-28 MED ORDER — POLYETHYLENE GLYCOL 3350 17 G PO PACK: 17.0000 g | PACK | Freq: Every day | ORAL | Status: DC | PRN

## 2018-11-28 MED ORDER — TRAMADOL HCL 50 MG PO TABS: 50.0000 mg | ORAL_TABLET | Freq: Three times a day (TID) | ORAL | Status: DC | PRN

## 2018-11-28 MED ORDER — ONDANSETRON HCL 4 MG/2ML IJ SOLN: 4.0000 mg | Freq: Four times a day (QID) | INTRAMUSCULAR | Status: DC | PRN

## 2018-11-28 MED ORDER — LISINOPRIL 20 MG PO TABS
20.0000 mg | ORAL_TABLET | Freq: Every day | ORAL | Status: DC
  Administered 2018-11-28: 20 mg via ORAL
  Filled 2018-11-28: qty 1

## 2018-11-28 MED ORDER — ADULT MULTIVITAMIN W/MINERALS CH
1.0000 | ORAL_TABLET | Freq: Every day | ORAL | Status: DC
  Administered 2018-11-28: 1 via ORAL
  Filled 2018-11-28: qty 1

## 2018-11-28 MED ORDER — ENOXAPARIN SODIUM 40 MG/0.4ML ~~LOC~~ SOLN: 40.0000 mg | Freq: Every day | SUBCUTANEOUS | Status: DC

## 2018-11-28 MED ORDER — FERROUS SULFATE 325 (65 FE) MG PO TABS: 325.0000 mg | ORAL_TABLET | Freq: Every day | ORAL | Status: DC

## 2018-11-28 NOTE — Progress Notes (Signed)
  Echocardiogram 2D Echocardiogram has been performed.  [name] 11/28/2018, 2:00 PM
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
## 2018-11-28 NOTE — ED Notes (Signed)
ED Provider at bedside. 
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
## 2018-11-28 NOTE — Discharge Instructions (Signed)
Acute Pain, Adult °Acute pain is a type of pain that may last for just a few days or as long as six months. It is often related to an illness, injury, or medical procedure. Acute pain may be mild, moderate, or severe. It usually goes away once your injury has healed or you are no longer ill. °Pain can make it hard for you to do daily activities. It can cause anxiety and lead to other problems if left untreated. Treatment depends on the cause and severity of your acute pain. °Follow these instructions at home: °· Check your pain level as told by your health care provider. °· Take over-the-counter and prescription medicines only as told by your health care provider. °· If you are taking prescription pain medicine: °? Ask your health care provider about taking a stool softener or laxative to prevent constipation. °? Do not stop taking the medicine suddenly. Talk to your health care provider about how and when to discontinue prescription pain medicine. °? If your pain is severe, do not take more pills than instructed by your health care provider. °? Do not take other over-the-counter pain medicines in addition to this medicine unless told by your health care provider. °? Do not drive or operate heavy machinery while taking prescription pain medicine. °· Apply ice or heat as told by your health care provider. These may reduce swelling and pain. °· Ask your health care provider if other strategies such as distraction, relaxation, or physical therapies can help your pain. °· Keep all follow-up visits as told by your health care provider. This is important. °Contact a health care provider if: °· You have pain that is not controlled by medicine. °· Your pain does not improve or gets worse. °· You have side effects from pain medicines, such as vomiting or confusion. °Get help right away if: °· You have severe pain. °· You have trouble breathing. °· You lose consciousness. °· You have chest pain or pressure that lasts for more  than a few minutes. Along with the chest pain you may: °? Have pain or discomfort in one or both arms, your back, neck, jaw, or stomach. °? Have shortness of breath. °? Break out in a cold sweat. °? Feel nauseous. °? Become light-headed. °These symptoms may represent a serious problem that is an emergency. Do not wait to see if the symptoms will go away. Get medical help right away. Call your local emergency services (911 in the [place] Do not drive yourself to the hospital. °This information is not intended to replace advice given to you by your health care provider. Make sure you discuss any questions you have with your health care provider. °Document Released: 12/07/2015 Document Revised: 04/30/2016 Document Reviewed: 12/07/2015 °Elsevier Interactive Patient Education © 2019 [employer] ° °
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
## 2018-11-28 NOTE — ED Notes (Signed)
Report given to RN Cecilie Lowers
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
## 2018-11-30 ENCOUNTER — Telehealth: Payer: Self-pay

## 2018-11-30 ENCOUNTER — Encounter: Payer: Self-pay | Admitting: Family Medicine

## 2018-11-30 ENCOUNTER — Ambulatory Visit (INDEPENDENT_AMBULATORY_CARE_PROVIDER_SITE_OTHER): Payer: PPO | Admitting: Family Medicine

## 2018-11-30 VITALS — BP 94/48 | HR 62 | Resp 16 | Wt 261.0 lb

## 2018-11-30 DIAGNOSIS — M25552 Pain in left hip: Secondary | ICD-10-CM | POA: Diagnosis not present

## 2018-11-30 DIAGNOSIS — R079 Chest pain, unspecified: Secondary | ICD-10-CM

## 2018-11-30 MED ORDER — DICLOFENAC SODIUM 2 % TD SOLN: 1.0000 "application " | Freq: Two times a day (BID) | TRANSDERMAL | 3 refills | Status: DC

## 2018-11-30 MED ORDER — PREDNISONE 5 MG PO TABS: ORAL_TABLET | ORAL | 0 refills | Status: DC
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
## 2018-11-30 NOTE — Patient Instructions (Addendum)
Good to see you  Please try the exercises  Please try ice on the right hip  Please be seen if you have any shortness of breath  Please see me back in 2-3 weeks if no better  Happy Holidays.

## 2018-11-30 NOTE — Telephone Encounter (Signed)
Attempt to follow up with transitional care management.  No answer. Unable to leave message. Appointment previously scheduled 12/11/18 at 1:00 with primary care provider. Will follow as appropriate.

## 2018-12-01 DIAGNOSIS — M25552 Pain in left hip: Secondary | ICD-10-CM | POA: Insufficient documentation

## 2018-12-01 DIAGNOSIS — M25559 Pain in unspecified hip: Secondary | ICD-10-CM | POA: Insufficient documentation
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
## 2018-12-01 NOTE — Telephone Encounter (Signed)
Transition Care Management Follow-up Telephone Call  How have you been since you were released from the hospital? Patient stated he felt ok but still has sharpe pain to chest  When he yawns or takes deep breathe.   Do you understand why you were in the hospital? yes   Do you understand the discharge instrcutions? yes  Items Reviewed:  Medications reviewed: yes  Allergies reviewed: yes  Dietary changes reviewed: yes  Referrals reviewed: yes   Functional Questionnaire:   Activities of Daily Living (ADLs):   He states they are independent in the following: ambulation, bathing and hygiene, feeding, continence, grooming, toileting and dressing States they require assistance with the following:    Any transportation issues/concerns?: no   Any patient concerns? no   Confirmed importance and date/time of follow-up visits scheduled: yes   Confirmed with patient if condition begins to worsen call PCP or go to the ER.  Patient was given the Call-a-Nurse line [phone]: yes
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
## 2018-12-07 ENCOUNTER — Telehealth: Payer: Self-pay | Admitting: Internal Medicine

## 2018-12-07 NOTE — Telephone Encounter (Signed)
Copied from [place] [id number]. Topic: Quick Communication - See Telephone Encounter >> Dec 07, 2018  1:04 PM Blase Mess A wrote: CRM for notification. See Telephone encounter for: 12/07/18.  The patient is calling because Diclofenac Sodium (PENNSAID) 2 % SOLN [[id number]] said the pharmacy needs a prior auth from his insurance company. Please advise [phone]

## 2018-12-11 ENCOUNTER — Other Ambulatory Visit (INDEPENDENT_AMBULATORY_CARE_PROVIDER_SITE_OTHER): Payer: PPO

## 2018-12-11 ENCOUNTER — Ambulatory Visit (INDEPENDENT_AMBULATORY_CARE_PROVIDER_SITE_OTHER): Payer: PPO | Admitting: General Practice

## 2018-12-11 ENCOUNTER — Ambulatory Visit (INDEPENDENT_AMBULATORY_CARE_PROVIDER_SITE_OTHER): Payer: PPO | Admitting: Internal Medicine

## 2018-12-11 ENCOUNTER — Encounter: Payer: Self-pay | Admitting: Internal Medicine

## 2018-12-11 VITALS — BP 124/80 | HR 55 | Temp 97.9°F | Ht 74.0 in | Wt 265.0 lb

## 2018-12-11 DIAGNOSIS — N179 Acute kidney failure, unspecified: Secondary | ICD-10-CM | POA: Insufficient documentation

## 2018-12-11 DIAGNOSIS — E119 Type 2 diabetes mellitus without complications: Secondary | ICD-10-CM | POA: Diagnosis not present

## 2018-12-11 DIAGNOSIS — M79605 Pain in left leg: Secondary | ICD-10-CM | POA: Insufficient documentation

## 2018-12-11 DIAGNOSIS — R51 Headache: Secondary | ICD-10-CM

## 2018-12-11 DIAGNOSIS — R519 Headache, unspecified: Secondary | ICD-10-CM

## 2018-12-11 DIAGNOSIS — Z7901 Long term (current) use of anticoagulants: Secondary | ICD-10-CM | POA: Diagnosis not present

## 2018-12-11 DIAGNOSIS — R040 Epistaxis: Secondary | ICD-10-CM

## 2018-12-11 DIAGNOSIS — J341 Cyst and mucocele of nose and nasal sinus: Secondary | ICD-10-CM

## 2018-12-11 DIAGNOSIS — T8619 Other complication of kidney transplant: Secondary | ICD-10-CM | POA: Insufficient documentation

## 2018-12-11 DIAGNOSIS — R972 Elevated prostate specific antigen [PSA]: Secondary | ICD-10-CM | POA: Diagnosis not present

## 2018-12-11 HISTORY — DX: Cyst and mucocele of nose and nasal sinus: J34.1

## 2018-12-11 LAB — HEPATIC FUNCTION PANEL
ALBUMIN: 4.2 g/dL (ref 3.5–5.2)
ALT: 33 U/L (ref 0–53)
AST: 22 U/L (ref 0–37)
Alkaline Phosphatase: 66 U/L (ref 39–117)
Bilirubin, Direct: 0.1 mg/dL (ref 0.0–0.3)
Total Bilirubin: 0.5 mg/dL (ref 0.2–1.2)
Total Protein: 6.4 g/dL (ref 6.0–8.3)

## 2018-12-11 LAB — LIPID PANEL
Cholesterol: 132 mg/dL (ref 0–200)
HDL: 39.8 mg/dL (ref >39.00)
LDL Cholesterol: 67 mg/dL (ref 0–99)
NonHDL: 92.66
Total CHOL/HDL Ratio: 3
Triglycerides: 130 mg/dL (ref 0.0–149.0)
VLDL: 26 mg/dL (ref 0.0–40.0)

## 2018-12-11 LAB — URINALYSIS, ROUTINE W REFLEX MICROSCOPIC
Bilirubin Urine: NEGATIVE
Hgb urine dipstick: NEGATIVE
Ketones, ur: NEGATIVE
LEUKOCYTES UA: NEGATIVE
Nitrite: NEGATIVE
RBC / HPF: NONE SEEN (ref 0–?)
Specific Gravity, Urine: 1.02 (ref 1.000–1.030)
Total Protein, Urine: NEGATIVE
Urine Glucose: NEGATIVE
Urobilinogen, UA: 0.2 (ref 0.0–1.0)
pH: 6 (ref 5.0–8.0)

## 2018-12-11 LAB — HEMOGLOBIN A1C: Hgb A1c MFr Bld: 6.8 % — ABNORMAL HIGH (ref 4.6–6.5)

## 2018-12-11 LAB — BASIC METABOLIC PANEL
BUN: 22 mg/dL (ref 6–23)
CALCIUM: 9.1 mg/dL (ref 8.4–10.5)
CO2: 28 mEq/L (ref 19–32)
Chloride: 104 mEq/L (ref 96–112)
Creatinine, Ser: 1.15 mg/dL (ref 0.40–1.50)
GFR: 82.08 mL/min (ref >60.00)
Glucose, Bld: 75 mg/dL (ref 70–99)
Potassium: 4.2 mEq/L (ref 3.5–5.1)
Sodium: 140 mEq/L (ref 135–145)

## 2018-12-11 LAB — CBC WITH DIFFERENTIAL/PLATELET
Basophils Absolute: 0 10*3/uL (ref 0.0–0.1)
Basophils Relative: 0.3 % (ref 0.0–3.0)
Eosinophils Absolute: 0.1 10*3/uL (ref 0.0–0.7)
Eosinophils Relative: 1.4 % (ref 0.0–5.0)
HCT: 41.6 % (ref 39.0–52.0)
Hemoglobin: 13 g/dL (ref 13.0–17.0)
Lymphocytes Relative: 39 % (ref 12.0–46.0)
Lymphs Abs: 2.7 10*3/uL (ref 0.7–4.0)
MCHC: 31.1 g/dL (ref 30.0–36.0)
MCV: 71.4 fl — ABNORMAL LOW (ref 78.0–100.0)
Monocytes Absolute: 0.7 10*3/uL (ref 0.1–1.0)
Monocytes Relative: 9.6 % (ref 3.0–12.0)
NEUTROS ABS: 3.4 10*3/uL (ref 1.4–7.7)
Neutrophils Relative %: 49.7 % (ref 43.0–77.0)
Platelets: 209 10*3/uL (ref 150.0–400.0)
RBC: 5.83 Mil/uL — ABNORMAL HIGH (ref 4.22–5.81)
RDW: 15.6 % — AB (ref 11.5–15.5)
WBC: 6.8 10*3/uL (ref 4.0–10.5)

## 2018-12-11 LAB — POCT INR: INR: 2.8 (ref 2.0–3.0)

## 2018-12-11 LAB — MICROALBUMIN / CREATININE URINE RATIO
Creatinine,U: 111.7 mg/dL
Microalb Creat Ratio: 0.9 mg/g (ref 0.0–30.0)
Microalb, Ur: 1 mg/dL (ref 0.0–1.9)

## 2018-12-11 LAB — TSH: TSH: 1.5 u[IU]/mL (ref 0.35–4.50)

## 2018-12-11 LAB — PSA: PSA: 2.33 ng/mL (ref 0.10–4.00)

## 2018-12-11 MED ORDER — CYCLOBENZAPRINE HCL 5 MG PO TABS: 5.0000 mg | ORAL_TABLET | Freq: Three times a day (TID) | ORAL | 0 refills | Status: DC | PRN

## 2018-12-11 MED ORDER — TRAMADOL HCL 50 MG PO TABS: 50.0000 mg | ORAL_TABLET | Freq: Three times a day (TID) | ORAL | 0 refills | Status: DC | PRN

## 2018-12-11 MED ORDER — GABAPENTIN 100 MG PO CAPS: 100.0000 mg | ORAL_CAPSULE | Freq: Three times a day (TID) | ORAL | 3 refills | Status: DC

## 2018-12-11 NOTE — Assessment & Plan Note (Signed)
Also for ENT referral 

## 2018-12-11 NOTE — Assessment & Plan Note (Signed)
Unusual, for ENT referral
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
## 2018-12-11 NOTE — Patient Instructions (Signed)
Pre visit review using our clinic review tool, if applicable. No additional management support is needed unless otherwise documented below in the visit note.  Continue to take 1 tablet daily except 2 tablets on Wednesdays.  Re-check in 6 weeks.  

## 2018-12-11 NOTE — Assessment & Plan Note (Signed)
?   Mild low volume, for f/u lab today

## 2018-12-11 NOTE — Patient Instructions (Addendum)
Please take all new medication as prescribed - the tramadol for pain, flexeril as needed for muscle relaxer, and gabapentin trial for the neck had headache pain  Please continue all other medications as before, and refills have been done if requested.  Please have the pharmacy call with any other refills you may need.  Please keep your appointments with your specialists as you may have planned - Coumadin clinic tomorrow  You will be contacted regarding the referral for: ENT for the nosebleed and left sinus cyst  Please go to the LAB in the Basement (turn left off the elevator) for the tests to be done today  You will be contacted by phone if any changes need to be made immediately.  Otherwise, you will receive a letter about your results with an explanation, but please check with MyChart first.  Please remember to sign up for MyChart if you have not done so, as this will be important to you in the future with finding out test results, communicating by private email, and scheduling acute appointments online when needed.

## 2018-12-11 NOTE — Assessment & Plan Note (Addendum)
With post neck pain and bilat occipital pain, for gabapentin trial,  to f/u any worsening symptoms or concerns  Note:  Total time for pt hx, exam, review of record with pt in the room, determination of diagnoses and plan for further eval and tx is > 40 min, with over 50% spent in coordination and counseling of patient including the differential dx, tx, further evaluation and other management of HA and neck pain, nosebleed, maxillary cyst, AKI, and left thigh/groin pain

## 2018-12-11 NOTE — Assessment & Plan Note (Signed)
For tramadol prn, and flexeril prn,  to f/u any worsening symptoms or concerns

## 2018-12-12 ENCOUNTER — Ambulatory Visit: Payer: Self-pay

## 2018-12-15 ENCOUNTER — Other Ambulatory Visit: Payer: Self-pay | Admitting: Internal Medicine

## 2018-12-15 DIAGNOSIS — R51 Headache: Secondary | ICD-10-CM | POA: Diagnosis not present

## 2018-12-15 DIAGNOSIS — M542 Cervicalgia: Secondary | ICD-10-CM | POA: Diagnosis not present

## 2018-12-20 DIAGNOSIS — M542 Cervicalgia: Secondary | ICD-10-CM | POA: Diagnosis not present

## 2018-12-20 DIAGNOSIS — R51 Headache: Secondary | ICD-10-CM | POA: Diagnosis not present

## 2018-12-22 DIAGNOSIS — R51 Headache: Secondary | ICD-10-CM | POA: Diagnosis not present

## 2018-12-22 DIAGNOSIS — M542 Cervicalgia: Secondary | ICD-10-CM | POA: Diagnosis not present

## 2018-12-27 DIAGNOSIS — M542 Cervicalgia: Secondary | ICD-10-CM | POA: Diagnosis not present

## 2018-12-27 DIAGNOSIS — R51 Headache: Secondary | ICD-10-CM | POA: Diagnosis not present

## 2018-12-30 ENCOUNTER — Other Ambulatory Visit: Payer: Self-pay | Admitting: Internal Medicine

## 2019-01-03 ENCOUNTER — Other Ambulatory Visit: Payer: Self-pay | Admitting: Internal Medicine

## 2019-01-05 DIAGNOSIS — R51 Headache: Secondary | ICD-10-CM | POA: Diagnosis not present

## 2019-01-05 DIAGNOSIS — M542 Cervicalgia: Secondary | ICD-10-CM | POA: Diagnosis not present

## 2019-01-08 DIAGNOSIS — R51 Headache: Secondary | ICD-10-CM | POA: Diagnosis not present

## 2019-01-08 DIAGNOSIS — M542 Cervicalgia: Secondary | ICD-10-CM | POA: Diagnosis not present

## 2019-01-12 ENCOUNTER — Encounter: Payer: Self-pay | Admitting: Internal Medicine

## 2019-01-12 ENCOUNTER — Ambulatory Visit (INDEPENDENT_AMBULATORY_CARE_PROVIDER_SITE_OTHER): Payer: PPO | Admitting: Internal Medicine

## 2019-01-12 VITALS — BP 104/66 | HR 60 | Temp 98.5°F | Ht 74.0 in | Wt 265.0 lb

## 2019-01-12 DIAGNOSIS — M545 Low back pain, unspecified: Secondary | ICD-10-CM | POA: Insufficient documentation

## 2019-01-12 DIAGNOSIS — G8929 Other chronic pain: Secondary | ICD-10-CM

## 2019-01-12 DIAGNOSIS — N183 Chronic kidney disease, stage 3 unspecified: Secondary | ICD-10-CM

## 2019-01-12 DIAGNOSIS — M5441 Lumbago with sciatica, right side: Secondary | ICD-10-CM

## 2019-01-12 DIAGNOSIS — M5442 Lumbago with sciatica, left side: Secondary | ICD-10-CM | POA: Diagnosis not present

## 2019-01-12 DIAGNOSIS — E1122 Type 2 diabetes mellitus with diabetic chronic kidney disease: Secondary | ICD-10-CM

## 2019-01-12 DIAGNOSIS — R51 Headache: Secondary | ICD-10-CM | POA: Diagnosis not present

## 2019-01-12 DIAGNOSIS — G4489 Other headache syndrome: Secondary | ICD-10-CM | POA: Diagnosis not present

## 2019-01-12 DIAGNOSIS — Z0000 Encounter for general adult medical examination without abnormal findings: Secondary | ICD-10-CM

## 2019-01-12 DIAGNOSIS — M542 Cervicalgia: Secondary | ICD-10-CM | POA: Diagnosis not present

## 2019-01-12 HISTORY — DX: Other chronic pain: G89.29

## 2019-01-12 NOTE — Assessment & Plan Note (Signed)
For MRI head, refer neurology per pt reqeust

## 2019-01-12 NOTE — Patient Instructions (Signed)
Please continue all other medications as before, and refills have been done if requested.  Please have the pharmacy call with any other refills you may need.  Please continue your efforts at being more active, low cholesterol diet, and weight control.  You are otherwise up to date with prevention measures today.  Please keep your appointments with your specialists as you may have planned  You will be contacted regarding the referral for: MRI for the head, and the lower back, and Neurology referral  Please return in 6 months, or sooner if needed, with Lab testing done 3-5 days before

## 2019-01-12 NOTE — Progress Notes (Signed)
Subjective:    Patient ID: [name], male    DOB: 1956/07/05, 63 y.o.   MRN: [id number]  HPI  Here for wellness and f/u;  Overall doing ok;  Pt denies Chest pain, worsening SOB, DOE, wheezing, orthopnea, PND, worsening LE edema, palpitations, dizziness or syncope.  Pt denies neurological change such as new facial or extremity weakness.  Pt denies polydipsia, polyuria, or low sugar symptoms. Pt states overall good compliance with treatment and medications, good tolerability, and has been trying to follow appropriate diet.  Pt denies worsening depressive symptoms, suicidal ideation or panic. No fever, night sweats, wt loss, loss of appetite, or other constitutional symptoms.  Pt states good ability with ADL's, has low fall risk, home safety reviewed and adequate, no other significant changes in hearing or vision, and only occasionally active with exercise.  Does also still have daily recurring HA not improved, as per last visit.  Also weith persistent Lower back aching with some radiation to both legs, also not improving, but now assoc with LE weakness or numbness.  Nothing seems to make better or worse.   Past Medical History:  Diagnosis Date  . BACK PAIN   . CAD in native artery    a. moderate by cardiac CT 2016.  Marland Kitchen COLONIC POLYPS, HX OF   . DEGENERATIVE JOINT DISEASE, KNEES, BILATERAL   . Depression   . Diabetes mellitus    type II  . DIVERTICULOSIS, COLON   . DVT, lower extremity ([place])    right  . Hyperlipidemia   . Hypertension   . HYPOGONADISM, MALE   . HYPOTENSION, ORTHOSTATIC   . Kidney stones   . Long term current use of anticoagulant   . Obesity   . Obstructive sleep apnea    uses cpap setting 8 to 10  . Pulmonary emboli ([place])   . Renal insufficiency    a. prior h/o, does not appear chronic.   Past Surgical History:  Procedure Laterality Date  . ankle surgury  2001 bilateral   with bone spurs and torn tendon  . EDG  2008   chronic Duodenitis  . GASTRIC BYPASS      . HEEL SPUR SURGERY Bilateral 2005  . knee surgury  2000 & 2003    cartilage damage  . LUMBAR LAMINECTOMY/DECOMPRESSION MICRODISCECTOMY  09/28/2012   Procedure: LUMBAR LAMINECTOMY/DECOMPRESSION MICRODISCECTOMY 2 LEVELS;  Surgeon: [name], MD;  Location: [facility];  Service: Orthopedics;  Laterality: Left;  Decompressive laminectomy L4-L5. Microdiscectomy L5-S1  . REPLACEMENT TOTAL KNEE  2013  . TONSILLECTOMY AND ADENOIDECTOMY  age 37 or 21  . TOTAL KNEE ARTHROPLASTY  01/10/2012   Procedure: TOTAL KNEE ARTHROPLASTY;  Surgeon: [name], MD;  Location: [facility];  Service: Orthopedics;  Laterality: Left;    reports that he quit smoking about 40 years ago. He has a 1.25 pack-year smoking history. He has never used smokeless tobacco. He reports current alcohol use. He reports that he does not use drugs. family history includes Diabetes in his brother, sister, sister, and sister; Heart failure in his sister; Hypertension in his mother; Prostate cancer in his father. Allergies  Allergen Reactions  . Adhesive [Tape] Other (See Comments)    Painful, Please use "paper" tape  . Other     NO BLOOD -JEHOVAH'S WITNESS-SIGNED REFUSAL BUT WOULD TAKE ALBUMIN   Current Outpatient Medications on File Prior to Visit  Medication Sig Dispense Refill  . Artificial Tear Ointment (ARTIFICIAL TEARS) ointment Place 1 drop  into both eyes as needed (for dry eyes).    Marland Kitchen aspirin EC 81 MG tablet Take 81 mg by mouth daily.    Marland Kitchen atorvastatin (LIPITOR) 80 MG tablet TAKE 1 TABLET (80 MG TOTAL) BY MOUTH DAILY. (Patient taking differently: Take 80 mg by mouth every evening. ) 90 tablet 3  . Calcium Carbonate (CALCI-CHEW PO) Take 1 tablet by mouth daily.     . chlorthalidone (HYGROTON) 25 MG tablet Take 1 tablet (25 mg total) by mouth daily. 90 tablet 0  . Cyanocobalamin (VITAMIN B-12 SL) Place 1 tablet under the tongue daily.    . cyclobenzaprine (FLEXERIL) 5 MG tablet Take 1 tablet (5 mg total) by mouth 3 (three)  times daily as needed for muscle spasms. 40 tablet 0  . Diclofenac Sodium (PENNSAID) 2 % SOLN Place 1 application onto the skin 2 (two) times daily. 1 Bottle 3  . escitalopram (LEXAPRO) 10 MG tablet Take 1 tablet (10 mg total) by mouth daily. 90 tablet 3  . Ferrous Sulfate (IRON) 90 (18 Fe) MG TABS Take 18 mg by mouth daily.    . fluticasone (FLONASE) 50 MCG/ACT nasal spray Place 2 sprays into both nostrils daily as needed for allergies.    Marland Kitchen gabapentin (NEURONTIN) 100 MG capsule Take 1 capsule (100 mg total) by mouth 3 (three) times daily. 90 capsule 3  . metFORMIN (GLUCOPHAGE) 500 MG tablet Take 2 tablets (1,000 mg total) by mouth daily with breakfast. 180 tablet 3  . methocarbamol (ROBAXIN) 500 MG tablet Take 1 tablet (500 mg total) by mouth 2 (two) times daily as needed for muscle spasms.    . Multiple Vitamin (MULTIVITAMIN WITH MINERALS) TABS tablet Take 1 tablet by mouth daily.    . nitroGLYCERIN (NITROSTAT) 0.4 MG SL tablet Place 1 tablet (0.4 mg total) under the tongue every 5 (five) minutes as needed for chest pain. 90 tablet 3  . polyethylene glycol (MIRALAX / GLYCOLAX) packet Take 17 g by mouth daily as needed for mild constipation.    . predniSONE (DELTASONE) 5 MG tablet Take 6 pills for first day, 5 pills second day, 4 pills third day, 3 pills fourth day, 2 pills the fifth day, and 1 pill sixth day. 21 tablet 0  . pregabalin (LYRICA) 50 MG capsule Take 1 capsule (50 mg total) by mouth at bedtime.    . senna-docusate (SENNA PLUS) 8.6-50 MG tablet Take 1 tablet by mouth daily as needed for mild constipation. Reported on 03/22/2016 90 tablet 1  . traMADol (ULTRAM) 50 MG tablet Take 1 tablet (50 mg total) by mouth every 8 (eight) hours as needed for moderate pain. 60 tablet 0  . warfarin (COUMADIN) 10 MG tablet Take 1-2 tablets (10-20 mg total) by mouth every morning. Take 1 tablet daily except 2 tablets on Wed or Take as directed by anticoagulation clinic.  90 Day     No current  facility-administered medications on file prior to visit.    Review of Systems Constitutional: Negative for other unusual diaphoresis, sweats, appetite or weight changes HENT: Negative for other worsening hearing loss, ear pain, facial swelling, mouth sores or neck stiffness.   Eyes: Negative for other worsening pain, redness or other visual disturbance.  Respiratory: Negative for other stridor or swelling Cardiovascular: Negative for other palpitations or other chest pain  Gastrointestinal: Negative for worsening diarrhea or loose stools, blood in stool, distention or other pain Genitourinary: Negative for hematuria, flank pain or other change in urine volume.  Musculoskeletal: Negative  for myalgias or other joint swelling.  Skin: Negative for other color change, or other wound or worsening drainage.  Neurological: Negative for other syncope or numbness. Hematological: Negative for other adenopathy or swelling Psychiatric/Behavioral: Negative for hallucinations, other worsening agitation, SI, self-injury, or new decreased concentration All other system neg per pt    Objective:   Physical Exam BP 104/66   Pulse 60   Temp 98.5 F (36.9 C) (Oral)   Ht 6\' 2"  (1.88 m)   Wt 265 lb (120.2 kg)   SpO2 95%   BMI 34.02 kg/m  VS noted,  Constitutional: Pt is oriented to person, place, and time. Appears well-developed and well-nourished, in no significant distress and comfortable Head: Normocephalic and atraumatic  Eyes: Conjunctivae and EOM are normal. Pupils are equal, round, and reactive to light Right Ear: External ear normal without discharge Left Ear: External ear normal without discharge Nose: Nose without discharge or deformity Mouth/Throat: Oropharynx is without other ulcerations and moist  Neck: Normal range of motion. Neck supple. No JVD present. No tracheal deviation present or significant neck LA or mass Cardiovascular: Normal rate, regular rhythm, normal heart sounds and intact  distal pulses.   Pulmonary/Chest: WOB normal and breath sounds without rales or wheezing  Abdominal: Soft. Bowel sounds are normal. NT. No HSM  Musculoskeletal: Normal range of motion. Exhibits no edema Lymphadenopathy: Has no other cervical adenopathy.  Neurological: Pt is alert and oriented to person, place, and time. Pt has normal reflexes. No cranial nerve deficit. Motor grossly intact, Gait intact Skin: Skin is warm and dry. No rash noted or new ulcerations Psychiatric:  Has normal mood and affect. Behavior is normal without agitation No other exam findings Lab Results  Component Value Date   WBC 6.8 12/11/2018   HGB 13.0 12/11/2018   HCT 41.6 12/11/2018   PLT 209.0 12/11/2018   GLUCOSE 75 12/11/2018   CHOL 132 12/11/2018   TRIG 130.0 12/11/2018   HDL 39.80 12/11/2018   LDLDIRECT 121.2 05/25/2011   LDLCALC 67 12/11/2018   ALT 33 12/11/2018   AST 22 12/11/2018   NA 140 12/11/2018   K 4.2 12/11/2018   CL 104 12/11/2018   CREATININE 1.15 12/11/2018   BUN 22 12/11/2018   CO2 28 12/11/2018   TSH 1.50 12/11/2018   PSA 2.33 12/11/2018   INR 2.8 12/11/2018   HGBA1C 6.8 (H) 12/11/2018   MICROALBUR 1.0 12/11/2018        Assessment & Plan:

## 2019-01-12 NOTE — Assessment & Plan Note (Signed)

## 2019-01-12 NOTE — Assessment & Plan Note (Signed)
stable overall by history and exam, recent data reviewed with pt, and pt to continue medical treatment as before,  to f/u any worsening symptoms or concerns  

## 2019-01-12 NOTE — Assessment & Plan Note (Signed)
For MRI, neurology referral

## 2019-01-15 DIAGNOSIS — R51 Headache: Secondary | ICD-10-CM | POA: Diagnosis not present

## 2019-01-15 DIAGNOSIS — M542 Cervicalgia: Secondary | ICD-10-CM | POA: Diagnosis not present

## 2019-01-16 ENCOUNTER — Encounter: Payer: Self-pay | Admitting: Internal Medicine

## 2019-01-20 ENCOUNTER — Ambulatory Visit
Admission: RE | Admit: 2019-01-20 | Discharge: 2019-01-20 | Disposition: A | Discharge status: Home / Self Care | Payer: PPO | Source: Ambulatory Visit | Attending: Internal Medicine | Admitting: Internal Medicine

## 2019-01-20 DIAGNOSIS — G8929 Other chronic pain: Secondary | ICD-10-CM

## 2019-01-20 DIAGNOSIS — G4489 Other headache syndrome: Secondary | ICD-10-CM

## 2019-01-20 DIAGNOSIS — R51 Headache: Secondary | ICD-10-CM | POA: Diagnosis not present

## 2019-01-20 DIAGNOSIS — M5442 Lumbago with sciatica, left side: Principal | ICD-10-CM

## 2019-01-20 DIAGNOSIS — M5441 Lumbago with sciatica, right side: Principal | ICD-10-CM

## 2019-01-20 DIAGNOSIS — M48061 Spinal stenosis, lumbar region without neurogenic claudication: Secondary | ICD-10-CM | POA: Diagnosis not present

## 2019-01-22 ENCOUNTER — Telehealth: Payer: Self-pay

## 2019-01-22 ENCOUNTER — Other Ambulatory Visit: Payer: Self-pay | Admitting: Internal Medicine

## 2019-01-22 DIAGNOSIS — M5416 Radiculopathy, lumbar region: Secondary | ICD-10-CM

## 2019-01-22 NOTE — Telephone Encounter (Signed)
Pt has viewed results via MyChart  

## 2019-01-22 NOTE — Telephone Encounter (Signed)
-----   Message from Biagio Borg, MD sent at 01/22/2019 12:44 PM EST ----- Left message on MyChart, pt to cont same tx except  The test results show that your current treatment is OK, except there is evidence for nerve pinching that seems to correlate with your symptoms,.  We should refer you to Orthopedics for further evaluation.  You should hopefully hear from the office soon. [name] to please inform pt, I will do referral

## 2019-01-23 ENCOUNTER — Ambulatory Visit (INDEPENDENT_AMBULATORY_CARE_PROVIDER_SITE_OTHER): Payer: PPO | Admitting: General Practice

## 2019-01-23 ENCOUNTER — Other Ambulatory Visit: Payer: Self-pay | Admitting: Internal Medicine

## 2019-01-23 DIAGNOSIS — Z7901 Long term (current) use of anticoagulants: Secondary | ICD-10-CM

## 2019-01-23 LAB — POCT INR: INR: 2.3 (ref 2.0–3.0)

## 2019-01-23 NOTE — Patient Instructions (Addendum)
Pre visit review using our clinic review tool, if applicable. No additional management support is needed unless otherwise documented below in the visit note.  Continue to take 1 tablet daily except 2 tablets on Wednesdays.  Re-check in 6 weeks.  

## 2019-01-24 ENCOUNTER — Other Ambulatory Visit: Payer: Self-pay | Admitting: Internal Medicine

## 2019-02-07 ENCOUNTER — Other Ambulatory Visit: Payer: Self-pay | Admitting: Internal Medicine

## 2019-02-12 DIAGNOSIS — M545 Low back pain: Secondary | ICD-10-CM | POA: Diagnosis not present

## 2019-03-05 ENCOUNTER — Telehealth: Payer: Self-pay

## 2019-03-05 NOTE — Telephone Encounter (Signed)
I contacted the pt in regards to his 03/06/19 appt with Dr. Jannifer Franklin at 8:30 am.  I advised, Due to current COVID 19 pandemic, our office is severely reducing in office visits for at least the next 2 weeks, in order to minimize the risk to our patients and healthcare providers.   Pt was advised during this time we are offering video visit's and pt was agreeable to video visit.  Pt understands that although there may be some limitations with this type of visit, we will take all precautions to reduce any security or privacy concerns.  Pt understands that this will be treated like an in office visit and we will file with pt's insurance, and there may be a patient responsible charge related to this service.  Pt's email is [email or web address]. Pt understands that the [employer] webex software must be downloaded and operational on the device pt plans to use for the visit.

## 2019-03-06 ENCOUNTER — Ambulatory Visit (INDEPENDENT_AMBULATORY_CARE_PROVIDER_SITE_OTHER): Payer: PPO | Admitting: General Practice

## 2019-03-06 ENCOUNTER — Ambulatory Visit (INDEPENDENT_AMBULATORY_CARE_PROVIDER_SITE_OTHER): Payer: PPO | Admitting: Neurology

## 2019-03-06 ENCOUNTER — Other Ambulatory Visit: Payer: Self-pay

## 2019-03-06 ENCOUNTER — Encounter: Payer: Self-pay | Admitting: Neurology

## 2019-03-06 DIAGNOSIS — M5417 Radiculopathy, lumbosacral region: Secondary | ICD-10-CM

## 2019-03-06 DIAGNOSIS — R51 Headache: Secondary | ICD-10-CM | POA: Diagnosis not present

## 2019-03-06 DIAGNOSIS — Z7901 Long term (current) use of anticoagulants: Secondary | ICD-10-CM | POA: Diagnosis not present

## 2019-03-06 DIAGNOSIS — R519 Headache, unspecified: Secondary | ICD-10-CM

## 2019-03-06 LAB — POCT INR: INR: 2.1 (ref 2.0–3.0)

## 2019-03-06 MED ORDER — PREGABALIN 50 MG PO CAPS: ORAL_CAPSULE | ORAL | 2 refills | Status: DC
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
## 2019-03-06 NOTE — Patient Instructions (Addendum)
Pre visit review using our clinic review tool, if applicable. No additional management support is needed unless otherwise documented below in the visit note.  Continue to take 1 tablet daily except 2 tablets on Wednesdays.  Re-check in 6 weeks.

## 2019-03-15 DIAGNOSIS — M545 Low back pain: Secondary | ICD-10-CM | POA: Diagnosis not present

## 2019-04-12 ENCOUNTER — Other Ambulatory Visit: Payer: Self-pay | Admitting: Internal Medicine

## 2019-04-17 ENCOUNTER — Ambulatory Visit (INDEPENDENT_AMBULATORY_CARE_PROVIDER_SITE_OTHER): Payer: PPO | Admitting: General Practice

## 2019-04-17 ENCOUNTER — Other Ambulatory Visit: Payer: Self-pay

## 2019-04-17 DIAGNOSIS — Z7901 Long term (current) use of anticoagulants: Secondary | ICD-10-CM

## 2019-04-17 LAB — POCT INR: INR: 2.1 (ref 2.0–3.0)

## 2019-04-17 NOTE — Patient Instructions (Signed)
Pre visit review using our clinic review tool, if applicable. No additional management support is needed unless otherwise documented below in the visit note.  Continue to take 1 tablet daily except 2 tablets on Wednesdays.  Re-check in 6 weeks.

## 2019-05-02 ENCOUNTER — Encounter (INDEPENDENT_AMBULATORY_CARE_PROVIDER_SITE_OTHER): Payer: PPO | Admitting: Ophthalmology

## 2019-05-02 ENCOUNTER — Other Ambulatory Visit: Payer: Self-pay

## 2019-05-02 DIAGNOSIS — H2513 Age-related nuclear cataract, bilateral: Secondary | ICD-10-CM | POA: Diagnosis not present

## 2019-05-02 DIAGNOSIS — H33303 Unspecified retinal break, bilateral: Secondary | ICD-10-CM | POA: Diagnosis not present

## 2019-05-02 DIAGNOSIS — H35033 Hypertensive retinopathy, bilateral: Secondary | ICD-10-CM

## 2019-05-02 DIAGNOSIS — H43813 Vitreous degeneration, bilateral: Secondary | ICD-10-CM

## 2019-05-02 DIAGNOSIS — I10 Essential (primary) hypertension: Secondary | ICD-10-CM

## 2019-05-29 ENCOUNTER — Ambulatory Visit: Payer: PPO

## 2019-06-01 ENCOUNTER — Other Ambulatory Visit: Payer: Self-pay

## 2019-06-01 ENCOUNTER — Ambulatory Visit (INDEPENDENT_AMBULATORY_CARE_PROVIDER_SITE_OTHER): Payer: PPO | Admitting: General Practice

## 2019-06-01 DIAGNOSIS — Z7901 Long term (current) use of anticoagulants: Secondary | ICD-10-CM | POA: Diagnosis not present

## 2019-06-01 LAB — POCT INR: INR: 3.3 — AB (ref 2.0–3.0)

## 2019-06-01 NOTE — Patient Instructions (Addendum)
Pre visit review using our clinic review tool, if applicable. No additional management support is needed unless otherwise documented below in the visit note.  Skip coumadin tomorrow (6/27) and then continue to take 1 tablet daily except 2 tablets on Wednesdays.  Re-check in 4 weeks.
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
## 2019-06-06 ENCOUNTER — Ambulatory Visit (INDEPENDENT_AMBULATORY_CARE_PROVIDER_SITE_OTHER): Payer: PPO | Admitting: Neurology

## 2019-06-06 ENCOUNTER — Encounter: Payer: Self-pay | Admitting: Neurology

## 2019-06-06 ENCOUNTER — Other Ambulatory Visit: Payer: Self-pay

## 2019-06-06 VITALS — BP 121/72 | HR 60 | Temp 97.8°F | Ht 74.0 in | Wt 266.6 lb

## 2019-06-06 DIAGNOSIS — R519 Headache, unspecified: Secondary | ICD-10-CM

## 2019-06-06 DIAGNOSIS — R51 Headache: Secondary | ICD-10-CM | POA: Diagnosis not present

## 2019-06-06 MED ORDER — PREGABALIN 100 MG PO CAPS: ORAL_CAPSULE | ORAL | 5 refills | Status: DC

## 2019-06-06 NOTE — Patient Instructions (Signed)
We will increase your Lyrica to 100 mg capsules taking 1 capsules twice daily. If no better in 3-4 weeks, call our office.

## 2019-06-06 NOTE — Progress Notes (Signed)
I have read the note, and I agree with the clinical assessment and plan.  Giselle Brutus K Ayshia Gramlich   

## 2019-06-29 ENCOUNTER — Other Ambulatory Visit: Payer: Self-pay

## 2019-06-29 ENCOUNTER — Ambulatory Visit (INDEPENDENT_AMBULATORY_CARE_PROVIDER_SITE_OTHER): Payer: PPO | Admitting: General Practice

## 2019-06-29 DIAGNOSIS — Z7901 Long term (current) use of anticoagulants: Secondary | ICD-10-CM

## 2019-06-29 LAB — POCT INR: INR: 4.9 — AB (ref 2.0–3.0)

## 2019-06-29 NOTE — Patient Instructions (Signed)
Pre visit review using our clinic review tool, if applicable. No additional management support is needed unless otherwise documented below in the visit note.  Skip dosage tomorrow and Sunday.  On Monday change dosage and take 1 tablet daily except 1 1/2 tablets on Wednesday.  Re-check in 3 weeks.

## 2019-07-04 ENCOUNTER — Other Ambulatory Visit: Payer: Self-pay | Admitting: Internal Medicine

## 2019-07-05 ENCOUNTER — Other Ambulatory Visit: Payer: Self-pay | Admitting: Internal Medicine

## 2019-07-12 ENCOUNTER — Other Ambulatory Visit: Payer: Self-pay | Admitting: Internal Medicine

## 2019-07-13 ENCOUNTER — Other Ambulatory Visit: Payer: Self-pay

## 2019-07-13 ENCOUNTER — Encounter: Payer: Self-pay | Admitting: Internal Medicine

## 2019-07-13 ENCOUNTER — Ambulatory Visit (INDEPENDENT_AMBULATORY_CARE_PROVIDER_SITE_OTHER): Payer: PPO | Admitting: Internal Medicine

## 2019-07-13 VITALS — BP 122/82 | HR 62 | Temp 97.6°F | Ht 74.0 in | Wt 269.0 lb

## 2019-07-13 DIAGNOSIS — E538 Deficiency of other specified B group vitamins: Secondary | ICD-10-CM

## 2019-07-13 DIAGNOSIS — E782 Mixed hyperlipidemia: Secondary | ICD-10-CM

## 2019-07-13 DIAGNOSIS — Z23 Encounter for immunization: Secondary | ICD-10-CM

## 2019-07-13 DIAGNOSIS — E611 Iron deficiency: Secondary | ICD-10-CM

## 2019-07-13 DIAGNOSIS — E559 Vitamin D deficiency, unspecified: Secondary | ICD-10-CM

## 2019-07-13 DIAGNOSIS — E1122 Type 2 diabetes mellitus with diabetic chronic kidney disease: Secondary | ICD-10-CM | POA: Diagnosis not present

## 2019-07-13 DIAGNOSIS — F329 Major depressive disorder, single episode, unspecified: Secondary | ICD-10-CM

## 2019-07-13 DIAGNOSIS — N183 Chronic kidney disease, stage 3 (moderate): Secondary | ICD-10-CM | POA: Diagnosis not present

## 2019-07-13 DIAGNOSIS — I10 Essential (primary) hypertension: Secondary | ICD-10-CM | POA: Diagnosis not present

## 2019-07-13 DIAGNOSIS — F32A Depression, unspecified: Secondary | ICD-10-CM

## 2019-07-13 DIAGNOSIS — Z0000 Encounter for general adult medical examination without abnormal findings: Secondary | ICD-10-CM

## 2019-07-13 LAB — POCT GLYCOSYLATED HEMOGLOBIN (HGB A1C): Hemoglobin A1C: 7.4 % — AB (ref 4.0–5.6)

## 2019-07-13 NOTE — Assessment & Plan Note (Signed)
stable overall by history and exam, recent data reviewed with pt, and pt to continue medical treatment as before,  to f/u any worsening symptoms or concerns  

## 2019-07-13 NOTE — Progress Notes (Signed)
Subjective:    Patient ID: [name], male    DOB: [birth year], 63 y.o.   MRN: [id number]  HPI  Here to f/u; overall doing ok,  Pt denies chest pain, increasing sob or doe, wheezing, orthopnea, PND, increased LE swelling, palpitations, dizziness or syncope.  Pt denies new neurological symptoms such as new headache, or facial or extremity weakness or numbness.  Pt denies polydipsia, polyuria, or low sugar episode.  Pt states overall good compliance with meds, mostly trying to follow appropriate diet, with wt overall stable,  but little exercise however.  No new complaints  Denies worsening depressive symptoms, suicidal ideation, or panic Past Medical History:  Diagnosis Date  . BACK PAIN   . CAD in native artery    a. moderate by cardiac CT 2016.  Marland Kitchen COLONIC POLYPS, HX OF   . DEGENERATIVE JOINT DISEASE, KNEES, BILATERAL   . Depression   . Diabetes mellitus    type II  . DIVERTICULOSIS, COLON   . DVT, lower extremity ([place])    right  . Hyperlipidemia   . Hypertension   . HYPOGONADISM, MALE   . HYPOTENSION, ORTHOSTATIC   . Kidney stones   . Long term current use of anticoagulant   . Obesity   . Obstructive sleep apnea    uses cpap setting 8 to 10  . Pulmonary emboli ([place])   . Renal insufficiency    a. prior h/o, does not appear chronic.   Past Surgical History:  Procedure Laterality Date  . ankle surgury  2001 bilateral   with bone spurs and torn tendon  . EDG  2008   chronic Duodenitis  . GASTRIC BYPASS    . HEEL SPUR SURGERY Bilateral 2005  . knee surgury  2000 & 2003    cartilage damage  . LUMBAR LAMINECTOMY/DECOMPRESSION MICRODISCECTOMY  09/28/2012   Procedure: LUMBAR LAMINECTOMY/DECOMPRESSION MICRODISCECTOMY 2 LEVELS;  Surgeon: [name], MD;  Location: [facility];  Service: Orthopedics;  Laterality: Left;  Decompressive laminectomy L4-L5. Microdiscectomy L5-S1  . REPLACEMENT TOTAL KNEE  2013  . TONSILLECTOMY AND ADENOIDECTOMY  age 28 or 27  . TOTAL KNEE  ARTHROPLASTY  01/10/2012   Procedure: TOTAL KNEE ARTHROPLASTY;  Surgeon: [name], MD;  Location: [facility];  Service: Orthopedics;  Laterality: Left;    reports that he quit smoking about 40 years ago. He has a 1.25 pack-year smoking history. He has never used smokeless tobacco. He reports current alcohol use. He reports that he does not use drugs. family history includes Diabetes in his brother, sister, sister, and sister; Heart failure in his sister; Hypertension in his mother; Prostate cancer in his father. Allergies  Allergen Reactions  . Adhesive [Tape] Other (See Comments)    Painful, Please use "paper" tape  . Other     NO BLOOD -JEHOVAH'S WITNESS-SIGNED REFUSAL BUT WOULD TAKE ALBUMIN   Current Outpatient Medications on File Prior to Visit  Medication Sig Dispense Refill  . Artificial Tear Ointment (ARTIFICIAL TEARS) ointment Place 1 drop into both eyes as needed (for dry eyes).    Marland Kitchen aspirin EC 81 MG tablet Take 81 mg by mouth daily.    Marland Kitchen atorvastatin (LIPITOR) 80 MG tablet TAKE 1 TABLET BY MOUTH EVERY DAY 90 tablet 1  . Calcium Carbonate (CALCI-CHEW PO) Take 1 tablet by mouth daily.     . chlorthalidone (HYGROTON) 25 MG tablet TAKE 1 TABLET BY MOUTH EVERY DAY 90 tablet 0  . Cyanocobalamin (VITAMIN B-12 SL) Place 1  tablet under the tongue daily.    . cyclobenzaprine (FLEXERIL) 5 MG tablet Take 1 tablet (5 mg total) by mouth 3 (three) times daily as needed for muscle spasms. 40 tablet 0  . Diclofenac Sodium (PENNSAID) 2 % SOLN Place 1 application onto the skin 2 (two) times daily. 1 Bottle 3  . escitalopram (LEXAPRO) 10 MG tablet TAKE 1 TABLET BY MOUTH EVERY DAY 90 tablet 2  . Ferrous Sulfate (IRON) 90 (18 Fe) MG TABS Take 18 mg by mouth daily.    . fluticasone (FLONASE) 50 MCG/ACT nasal spray Place 2 sprays into both nostrils daily as needed for allergies.    Marland Kitchen gabapentin (NEURONTIN) 100 MG capsule Take 1 capsule (100 mg total) by mouth 3 (three) times daily. 90 capsule 3  .  metFORMIN (GLUCOPHAGE) 500 MG tablet TAKE 2 TABLETS BY MOUTH EVERY DAY WITH BREAKFAST 180 tablet 3  . methocarbamol (ROBAXIN) 500 MG tablet Take 1 tablet (500 mg total) by mouth 2 (two) times daily as needed for muscle spasms.    . Multiple Vitamin (MULTIVITAMIN WITH MINERALS) TABS tablet Take 1 tablet by mouth daily.    . nitroGLYCERIN (NITROSTAT) 0.4 MG SL tablet Place 1 tablet (0.4 mg total) under the tongue every 5 (five) minutes as needed for chest pain. 90 tablet 3  . pregabalin (LYRICA) 100 MG capsule Take 1 Capsule in the morning, 1 in the evening 60 capsule 5  . SENNA CO by Combination route. As needed for constipation.    . SENNA-PLUS 8.6-50 MG tablet TAKE 1 TABLET BY MOUTH DAILY AS NEEDED FOR MILD CONSTIPATION. REPORTED ON 03/22/2016 90 tablet 1  . traMADol (ULTRAM) 50 MG tablet Take 1 tablet (50 mg total) by mouth every 8 (eight) hours as needed for moderate pain. 60 tablet 0  . warfarin (COUMADIN) 10 MG tablet TAKE 1 TABLET DAILY EXCEPT 2 TABLETS ON WED OR TAKE AS DIRECTED BY ANTICOAGULATION CLINIC 120 tablet 1   No current facility-administered medications on file prior to visit.    Review of Systems  Constitutional: Negative for other unusual diaphoresis or sweats HENT: Negative for ear discharge or swelling Eyes: Negative for other worsening visual disturbances Respiratory: Negative for stridor or other swelling  Gastrointestinal: Negative for worsening distension or other blood Genitourinary: Negative for retention or other urinary change Musculoskeletal: Negative for other MSK pain or swelling Skin: Negative for color change or other new lesions Neurological: Negative for worsening tremors and other numbness  Psychiatric/Behavioral: Negative for worsening agitation or other fatigue All other system neg per pt    Objective:   Physical Exam BP 122/82   Pulse 62   Temp 97.6 F (36.4 C) (Oral)   Ht 6\' 2"  (1.88 m)   Wt 269 lb (122 kg)   SpO2 96%   BMI 34.54 kg/m  VS  noted,  Constitutional: Pt appears in NAD HENT: Head: NCAT.  Right Ear: External ear normal.  Left Ear: External ear normal.  Eyes: . Pupils are equal, round, and reactive to light. Conjunctivae and EOM are normal Nose: without d/c or deformity Neck: Neck supple. Gross normal ROM Cardiovascular: Normal rate and regular rhythm.   Pulmonary/Chest: Effort normal and breath sounds without rales or wheezing.  Abd:  Soft, NT, ND, + BS, no organomegaly Neurological: Pt is alert. At baseline orientation, motor grossly intact Skin: Skin is warm. No rashes, other new lesions, no LE edema Psychiatric: Pt behavior is normal without agitation , not depressed affect No other exam findings  Lab Results  Component Value Date   WBC 6.8 12/11/2018   HGB 13.0 12/11/2018   HCT 41.6 12/11/2018   PLT 209.0 12/11/2018   GLUCOSE 75 12/11/2018   CHOL 132 12/11/2018   TRIG 130.0 12/11/2018   HDL 39.80 12/11/2018   LDLDIRECT 121.2 05/25/2011   LDLCALC 67 12/11/2018   ALT 33 12/11/2018   AST 22 12/11/2018   NA 140 12/11/2018   K 4.2 12/11/2018   CL 104 12/11/2018   CREATININE 1.15 12/11/2018   BUN 22 12/11/2018   CO2 28 12/11/2018   TSH 1.50 12/11/2018   PSA 2.33 12/11/2018   INR 4.9 (A) 06/29/2019   HGBA1C 7.4 (A) 07/13/2019   MICROALBUR 1.0 12/11/2018   Contains abnormal data POCT glycosylated hemoglobin (Hb A1C) Order: [id number] Status:  Final result Visible to patient:  No (not released) Dx:  Type 2 diabetes mellitus with stage 3...  Ref Range & Units 16:07 (07/13/19) 60mo ago (12/11/18) 50yr ago (07/12/18) 92yr ago (12/21/17) 61yr ago (06/28/17) 71yr ago (12/28/16) 53yr ago (06/24/16)  Hemoglobin A1C 4.0 - 5.6 % 7.4Abnormal   6.8High  R, CM  7.0High  R, CM  7.3High  R, CM  6.5 R, CM  6.2 R, CM  6.0            Assessment & Plan:

## 2019-07-13 NOTE — Patient Instructions (Signed)
You had the Tdap tetanus shot today  Your A1c was OK today  Please continue all other medications as before, and refills have been done if requested.  Please have the pharmacy call with any other refills you may need.  Please continue your efforts at being more active, low cholesterol diet, and weight control.  You are otherwise up to date with prevention measures today.  Please keep your appointments with your specialists as you may have planned  Please return in 6 months, or sooner if needed, with Lab testing done 3-5 days before

## 2019-07-20 ENCOUNTER — Other Ambulatory Visit: Payer: Self-pay

## 2019-07-20 ENCOUNTER — Ambulatory Visit (INDEPENDENT_AMBULATORY_CARE_PROVIDER_SITE_OTHER): Payer: PPO | Admitting: General Practice

## 2019-07-20 DIAGNOSIS — Z7901 Long term (current) use of anticoagulants: Secondary | ICD-10-CM

## 2019-07-20 LAB — POCT INR: INR: 3.5 — AB (ref 2.0–3.0)

## 2019-07-20 NOTE — Patient Instructions (Addendum)
Pre visit review using our clinic review tool, if applicable. No additional management support is needed unless otherwise documented below in the visit note.  Skip dosage tomorrow and change dosage and take 1 tablet daily.  Re-check in 3 to 4 weeks.

## 2019-08-16 ENCOUNTER — Other Ambulatory Visit: Payer: Self-pay | Admitting: Internal Medicine

## 2019-08-17 ENCOUNTER — Ambulatory Visit (INDEPENDENT_AMBULATORY_CARE_PROVIDER_SITE_OTHER): Payer: PPO | Admitting: General Practice

## 2019-08-17 ENCOUNTER — Other Ambulatory Visit: Payer: Self-pay

## 2019-08-17 DIAGNOSIS — Z7901 Long term (current) use of anticoagulants: Secondary | ICD-10-CM | POA: Diagnosis not present

## 2019-08-17 LAB — POCT INR: INR: 3 (ref 2.0–3.0)

## 2019-08-17 NOTE — Patient Instructions (Addendum)
Pre visit review using our clinic review tool, if applicable. No additional management support is needed unless otherwise documented below in the visit note.  Continue to take 1 tablet daily.  Re-check in 3 to 4 weeks.

## 2019-09-14 ENCOUNTER — Other Ambulatory Visit: Payer: Self-pay

## 2019-09-14 ENCOUNTER — Ambulatory Visit (INDEPENDENT_AMBULATORY_CARE_PROVIDER_SITE_OTHER): Payer: PPO | Admitting: General Practice

## 2019-09-14 DIAGNOSIS — I2699 Other pulmonary embolism without acute cor pulmonale: Secondary | ICD-10-CM

## 2019-09-14 DIAGNOSIS — Z7901 Long term (current) use of anticoagulants: Secondary | ICD-10-CM

## 2019-09-14 LAB — POCT INR: INR: 1.5 — AB (ref 2.0–3.0)

## 2019-09-14 NOTE — Patient Instructions (Addendum)
Pre visit review using our clinic review tool, if applicable. No additional management support is needed unless otherwise documented below in the visit note.  Take 1 1/2 tablets today and tomorrow and then continue to take 1 tablet daily.  Re-check in 3 weeks. Reduce greens intake.

## 2019-09-16 NOTE — Progress Notes (Signed)
Agree with management.  Cammeron Greis J Zianne Schubring, MD  

## 2019-09-21 ENCOUNTER — Other Ambulatory Visit: Payer: Self-pay

## 2019-09-21 DIAGNOSIS — Z20822 Contact with and (suspected) exposure to covid-19: Secondary | ICD-10-CM

## 2019-09-21 DIAGNOSIS — Z20828 Contact with and (suspected) exposure to other viral communicable diseases: Secondary | ICD-10-CM | POA: Diagnosis not present

## 2019-09-23 LAB — NOVEL CORONAVIRUS, NAA: SARS-CoV-2, NAA: NOT DETECTED

## 2019-10-02 ENCOUNTER — Ambulatory Visit (INDEPENDENT_AMBULATORY_CARE_PROVIDER_SITE_OTHER): Payer: PPO | Admitting: *Deleted

## 2019-10-02 DIAGNOSIS — Z0000 Encounter for general adult medical examination without abnormal findings: Secondary | ICD-10-CM

## 2019-10-02 NOTE — Progress Notes (Addendum)
Subjective:   [name] is a 63 y.o. male who presents for Medicare Annual/Subsequent preventive examination. I connected with patient by a telephone and verified that I am speaking with the correct person using two identifiers. Patient stated full name and DOB. Patient gave permission to continue with telephonic visit. Patient's location was at home and Nurse's location was at [place] office. Participants during this visit included patient and nurse.  Review of Systems:   Cardiac Risk Factors include: advanced age (>47men, >24 women);diabetes mellitus;dyslipidemia;male gender;hypertension Sleep patterns: feels rested on waking, gets up 0-1 times nightly to void and sleeps 7-8 hours nightly.    Home Safety/Smoke Alarms: Feels safe in home. Smoke alarms in place.  Living environment; residence and Firearm Safety: 1-story house/ trailer. Lives with wife, no needs for DME, good support system Seat Belt Safety/Bike Helmet: Wears seat belt.      Objective:    Vitals: There were no vitals taken for this visit.  There is no height or weight on file to calculate BMI.  Advanced Directives 10/02/2019 11/28/2018 11/27/2018 09/26/2018 09/23/2017 06/28/2016 04/17/2016  Does Patient Have a Medical Advance Directive? No No No No No No No  Does patient want to make changes to medical advance directive? - - - No - Patient declined - - -  Would patient like information on creating a medical advance directive? Yes (ED - Information included in AVS) - No - Patient declined - Yes (ED - Information included in AVS) Yes - Educational materials given No - patient declined information  Pre-existing out of facility DNR order (yellow form or pink MOST form) - - - - - - -    Tobacco Social History   Tobacco Use  Smoking Status Former Smoker  . Packs/day: 0.25  . Years: 5.00  . Pack years: 1.25  . Quit date: 12/06/1978  . Years since quitting: 40.8  Smokeless Tobacco Never Used     Counseling given:  Not Answered  Past Medical History:  Diagnosis Date  . BACK PAIN   . CAD in native artery    a. moderate by cardiac CT 2016.  Marland Kitchen COLONIC POLYPS, HX OF   . DEGENERATIVE JOINT DISEASE, KNEES, BILATERAL   . Depression   . Diabetes mellitus    type II  . DIVERTICULOSIS, COLON   . DVT, lower extremity ([place])    right  . Hyperlipidemia   . Hypertension   . HYPOGONADISM, MALE   . HYPOTENSION, ORTHOSTATIC   . Kidney stones   . Long term current use of anticoagulant   . Obesity   . Obstructive sleep apnea    uses cpap setting 8 to 10  . Pulmonary emboli ([place])   . Renal insufficiency    a. prior h/o, does not appear chronic.   Past Surgical History:  Procedure Laterality Date  . ankle surgury  2001 bilateral   with bone spurs and torn tendon  . EDG  2008   chronic Duodenitis  . GASTRIC BYPASS    . HEEL SPUR SURGERY Bilateral 2005  . knee surgury  2000 & 2003    cartilage damage  . LUMBAR LAMINECTOMY/DECOMPRESSION MICRODISCECTOMY  09/28/2012   Procedure: LUMBAR LAMINECTOMY/DECOMPRESSION MICRODISCECTOMY 2 LEVELS;  Surgeon: [name], MD;  Location: [facility];  Service: Orthopedics;  Laterality: Left;  Decompressive laminectomy L4-L5. Microdiscectomy L5-S1  . REPLACEMENT TOTAL KNEE  2013  . TONSILLECTOMY AND ADENOIDECTOMY  age 56 or 48  . TOTAL KNEE ARTHROPLASTY  01/10/2012  Procedure: TOTAL KNEE ARTHROPLASTY;  Surgeon: [name], MD;  Location: [facility];  Service: Orthopedics;  Laterality: Left;   Family History  Problem Relation Age of Onset  . Hypertension Mother   . Prostate cancer Father   . Diabetes Sister        died with DM March 05, 2001, 2 more sisters with diabetes  . Heart failure Sister   . Diabetes Brother   . Diabetes Sister   . Diabetes Sister    Social History   Socioeconomic History  . Marital status: Married    Spouse name: Not on file  . Number of children: 2  . Years of education: Not on file  . Highest education level: High school graduate   Occupational History  . Occupation: Retired    Fish farm manager: [employer]  Social Needs  . Financial resource strain: Not hard at all  . Food insecurity    Worry: Never true    Inability: Never true  . Transportation needs    Medical: No    Non-medical: No  Tobacco Use  . Smoking status: Former Smoker    Packs/day: 0.25    Years: 5.00    Pack years: 1.25    Quit date: 12/06/1978    Years since quitting: 40.8  . Smokeless tobacco: Never Used  Substance and Sexual Activity  . Alcohol use: Yes    Alcohol/week: 0.0 standard drinks    Comment: occasional  . Drug use: No  . Sexual activity: Not Currently  Lifestyle  . Physical activity    Days per week: 4 days    Minutes per session: 40 min  . Stress: Not at all  Relationships  . Social connections    Talks on phone: More than three times a week    Gets together: More than three times a week    Attends religious service: Never    Active member of club or organization: Yes    Attends meetings of clubs or organizations: More than 4 times per year    Relationship status: Married  Other Topics Concern  . Not on file  Social History Narrative   Daily caffeine use one per day   Protein drinks    Children; a friend and sister that helps   Brother that is around [place])   Son and dtr in [place]   Lives w/ wife in [place]   Left handed     Outpatient Encounter Medications as of 10/02/2019  Medication Sig  . Artificial Tear Ointment (ARTIFICIAL TEARS) ointment Place 1 drop into both eyes as needed (for dry eyes).  Marland Kitchen aspirin EC 81 MG tablet Take 81 mg by mouth daily.  Marland Kitchen atorvastatin (LIPITOR) 80 MG tablet TAKE 1 TABLET BY MOUTH EVERY DAY  . Calcium Carbonate (CALCI-CHEW PO) Take 1 tablet by mouth daily.   . chlorthalidone (HYGROTON) 25 MG tablet TAKE 1 TABLET BY MOUTH EVERY DAY  . Cyanocobalamin (VITAMIN B-12 SL) Place 1 tablet under the tongue daily.  . cyclobenzaprine (FLEXERIL) 5 MG tablet Take 1 tablet (5 mg total) by  mouth 3 (three) times daily as needed for muscle spasms.  . Diclofenac Sodium (PENNSAID) 2 % SOLN Place 1 application onto the skin 2 (two) times daily.  Marland Kitchen escitalopram (LEXAPRO) 10 MG tablet TAKE 1 TABLET BY MOUTH EVERY DAY  . Ferrous Sulfate (IRON) 90 (18 Fe) MG TABS Take 18 mg by mouth daily.  . fluticasone (FLONASE) 50 MCG/ACT nasal spray Place 2 sprays into both nostrils daily as  needed for allergies.  Marland Kitchen gabapentin (NEURONTIN) 100 MG capsule Take 1 capsule (100 mg total) by mouth 3 (three) times daily.  . metFORMIN (GLUCOPHAGE) 500 MG tablet TAKE 2 TABLETS BY MOUTH EVERY DAY WITH BREAKFAST  . Multiple Vitamin (MULTIVITAMIN WITH MINERALS) TABS tablet Take 1 tablet by mouth daily.  . nitroGLYCERIN (NITROSTAT) 0.4 MG SL tablet Place 1 tablet (0.4 mg total) under the tongue every 5 (five) minutes as needed for chest pain.  . pregabalin (LYRICA) 100 MG capsule Take 1 Capsule in the morning, 1 in the evening  . SENNA CO by Combination route. As needed for constipation.  . SENNA-PLUS 8.6-50 MG tablet TAKE 1 TABLET BY MOUTH DAILY AS NEEDED FOR MILD CONSTIPATION. REPORTED ON 03/22/2016  . traMADol (ULTRAM) 50 MG tablet Take 1 tablet (50 mg total) by mouth every 8 (eight) hours as needed for moderate pain.  Marland Kitchen warfarin (COUMADIN) 10 MG tablet TAKE 1 TABLET DAILY EXCEPT 2 TABLETS ON WED OR TAKE AS DIRECTED BY ANTICOAGULATION CLINIC  . [DISCONTINUED] methocarbamol (ROBAXIN) 500 MG tablet Take 1 tablet (500 mg total) by mouth 2 (two) times daily as needed for muscle spasms. (Patient not taking: Reported on 10/02/2019)   No facility-administered encounter medications on file as of 10/02/2019.     Activities of Daily Living In your present state of health, do you have any difficulty performing the following activities: 10/02/2019  Hearing? N  Vision? N  Difficulty concentrating or making decisions? N  Walking or climbing stairs? N  Dressing or bathing? N  Doing errands, shopping? N  Preparing Food and  eating ? N  Using the Toilet? N  In the past six months, have you accidently leaked urine? N  Do you have problems with loss of bowel control? N  Managing your Medications? N  Managing your Finances? N  Housekeeping or managing your Housekeeping? N  Some recent data might be hidden    Patient Care Team: Biagio Borg, MD as PCP - General Meda Coffee Jamse Belfast, MD as PCP - Cardiology (Cardiology) Suella Broad, MD as Consulting Physician (Physical Medicine and Rehabilitation) Eldridge Abrahams, MD as Referring Physician (Surgical Oncology) Renato Shin, MD as Consulting Physician (Endocrinology)   Assessment:   This is a routine wellness examination for [name]. Physical assessment deferred to PCP.  Exercise Activities and Dietary recommendations Current Exercise Habits: Home exercise routine, Type of exercise: walking(gardening), Time (Minutes): 40, Frequency (Times/Week): 5, Weekly Exercise (Minutes/Week): 200, Intensity: Mild  Diet (meal preparation, eat out, water intake, caffeinated beverages, dairy products, fruits and vegetables): in general, a "healthy" diet     Reviewed heart healthy and diabetic diet. Encouraged patient to increase daily water and healthy fluid intake.  Goals    . Be as healthy as possible and continue to lose weight     Continue to exercise and eat healthy.    . Exercise 3x per week (30 min per time)     Hopes to stay active; will focus on keeping back strong; to fup with PT regarding exercises recommended to keep the back strong. Golf for fun     . Patient Stated     I want to go to the [place], start swimming and do water workouts.     . Stay as healthy as possible and maintain my weight     . Weight (lb) < 225 lb (102.1 kg)     Continue with healthy regiment; still seeing weight loss on current regiment; Every 2 months approx  2 lbs;        Fall Risk Fall Risk  10/02/2019 01/12/2019 09/26/2018 12/21/2017 09/23/2017  Falls in the past  year? 0 0 No No No  Number falls in past yr: 0 - - - -  Injury with Fall? 0 - - - -   Is the patient's home free of loose throw rugs in walkways, pet beds, electrical cords, etc?   yes      Grab bars in the bathroom? yes      Handrails on the stairs?   yes      Adequate lighting?   yes  Depression Screen PHQ 2/9 Scores 10/02/2019 01/12/2019 09/26/2018 12/21/2017  PHQ - 2 Score 0 1 0 0  PHQ- 9 Score 0 - - -    Cognitive Function MMSE - Mini Mental State Exam 09/23/2017 06/28/2016  Not completed: - (No Data)  Orientation to time 5 -  Orientation to Place 5 -  Registration 3 -  Attention/ Calculation 5 -  Recall 2 -  Language- name 2 objects 2 -  Language- repeat 1 -  Language- follow 3 step command 3 -  Language- read & follow direction 1 -  Write a sentence 1 -  Copy design 1 -  Total score 29 -       Ad8 score reviewed for issues:  Issues making decisions: no  Less interest in hobbies / activities: no  Repeats questions, stories (family complaining): no  Trouble using ordinary gadgets (microwave, computer, phone):no  Forgets the month or year: no  Mismanaging finances: no  Remembering appts: no  Daily problems with thinking and/or memory: no Ad8 score is= 0  Immunization History  Administered Date(s) Administered  . Influenza Split 08/19/2011, 10/30/2012  . Influenza Whole 09/21/2005, 09/02/2008, 12/24/2009  . Influenza,inj,Quad PF,6+ Mos 09/13/2013, 09/30/2014, 10/29/2015, 11/15/2016, 09/23/2017, 09/19/2018  . Pneumococcal Conjugate-13 03/13/2014  . Pneumococcal Polysaccharide-23 03/04/2008, 06/28/2016, 11/28/2018  . Td 03/11/2009  . Tdap 07/13/2019  . Zoster 10/29/2015   Screening Tests Health Maintenance  Topic Date Due  . INFLUENZA VACCINE  03/05/2020 (Originally 07/07/2019)  . URINE MICROALBUMIN  12/12/2019  . HEMOGLOBIN A1C  01/13/2020  . COLONOSCOPY  03/18/2020  . OPHTHALMOLOGY EXAM  05/03/2020  . FOOT EXAM  07/12/2020  . PNA vac Low Risk Adult  (2 of 2 - PPSV23) 11/29/2023  . TETANUS/TDAP  07/12/2029  . Hepatitis C Screening  Completed  . HIV Screening  Completed       Plan:    Reviewed health maintenance screenings with patient today and relevant education, vaccines, and/or referrals were provided.   I have personally reviewed and noted the following in the patient's chart:   . Medical and social history . Use of alcohol, tobacco or illicit drugs  . Current medications and supplements . Functional ability and status . Nutritional status . Physical activity . Advanced directives . List of other physicians . Screenings to include cognitive, depression, and falls . Referrals and appointments  In addition, I have reviewed and discussed with patient certain preventive protocols, quality metrics, and best practice recommendations. A written personalized care plan for preventive services as well as general preventive health recommendations were provided to patient.     Michiel Cowboy, RN  10/02/2019  Medical screening examination/treatment/procedure(s) were performed by non-physician practitioner and as supervising physician I was immediately available for consultation/collaboration. I agree with above. Cathlean Cower, MD

## 2019-10-02 NOTE — Patient Instructions (Signed)
Continue to eat heart healthy diet (full of fruits, vegetables, whole grains, lean protein, water--limit salt, fat, and sugar intake) and increase physical activity as tolerated.  Continue doing brain stimulating activities (puzzles, reading, adult coloring books, staying active) to keep memory sharp.    [name] , Thank you for taking time to come for your Medicare Wellness Visit. I appreciate your ongoing commitment to your health goals. Please review the following plan we discussed and let me know if I can assist you in the future.   These are the goals we discussed: Goals    . Be as healthy as possible and continue to lose weight     Continue stay active physically and mentally. Start to exercise routinely with my wife.        This is a list of the screening recommended for you and due dates:  Health Maintenance  Topic Date Due  . Flu Shot  03/05/2020*  . Urine Protein Check  12/12/2019  . Hemoglobin A1C  01/13/2020  . Colon Cancer Screening  03/18/2020  . Eye exam for diabetics  05/03/2020  . Complete foot exam   07/12/2020  . Pneumonia vaccines (2 of 2 - PPSV23) 11/29/2023  . Tetanus Vaccine  07/12/2029  .  Hepatitis C: One time screening is recommended by Center for Disease Control  (CDC) for  adults born from [birth year] through 1967.   Completed  . HIV Screening  Completed  *Topic was postponed. The date shown is not the original due date.    Preventive Care 74 Years and Older, Male Preventive care refers to lifestyle choices and visits with your health care provider that can promote health and wellness. This includes:  A yearly physical exam. This is also called an annual well check.  Regular dental and eye exams.  Immunizations.  Screening for certain conditions.  Healthy lifestyle choices, such as diet and exercise. What can I expect for my preventive care visit? Physical exam Your health care provider will check:  Height and weight. These may be used to  calculate body mass index (BMI), which is a measurement that tells if you are at a healthy weight.  Heart rate and blood pressure.  Your skin for abnormal spots. Counseling Your health care provider may ask you questions about:  Alcohol, tobacco, and drug use.  Emotional well-being.  Home and relationship well-being.  Sexual activity.  Eating habits.  History of falls.  Memory and ability to understand (cognition).  Work and work Statistician. What immunizations do I need?  Influenza (flu) vaccine  This is recommended every year. Tetanus, diphtheria, and pertussis (Tdap) vaccine  You may need a Td booster every 10 years. Varicella (chickenpox) vaccine  You may need this vaccine if you have not already been vaccinated. Zoster (shingles) vaccine  You may need this after age 21. Pneumococcal conjugate (PCV13) vaccine  One dose is recommended after age 65. Pneumococcal polysaccharide (PPSV23) vaccine  One dose is recommended after age 59. Measles, mumps, and rubella (MMR) vaccine  You may need at least one dose of MMR if you were born in 1957 or later. You may also need a second dose. Meningococcal conjugate (MenACWY) vaccine  You may need this if you have certain conditions. Hepatitis A vaccine  You may need this if you have certain conditions or if you travel or work in places where you may be exposed to hepatitis A. Hepatitis B vaccine  You may need this if you have certain conditions or  if you travel or work in places where you may be exposed to hepatitis B. Haemophilus influenzae type b (Hib) vaccine  You may need this if you have certain conditions. You may receive vaccines as individual doses or as more than one vaccine together in one shot (combination vaccines). Talk with your health care provider about the risks and benefits of combination vaccines. What tests do I need? Blood tests  Lipid and cholesterol levels. These may be checked every 5 years,  or more frequently depending on your overall health.  Hepatitis C test.  Hepatitis B test. Screening  Lung cancer screening. You may have this screening every year starting at age 85 if you have a 30-pack-year history of smoking and currently smoke or have quit within the past 15 years.  Colorectal cancer screening. All adults should have this screening starting at age 47 and continuing until age 15. Your health care provider may recommend screening at age 28 if you are at increased risk. You will have tests every 1-10 years, depending on your results and the type of screening test.  Prostate cancer screening. Recommendations will vary depending on your family history and other risks.  Diabetes screening. This is done by checking your blood sugar (glucose) after you have not eaten for a while (fasting). You may have this done every 1-3 years.  Abdominal aortic aneurysm (AAA) screening. You may need this if you are a current or former smoker.  Sexually transmitted disease (STD) testing. Follow these instructions at home: Eating and drinking  Eat a diet that includes fresh fruits and vegetables, whole grains, lean protein, and low-fat dairy products. Limit your intake of foods with high amounts of sugar, saturated fats, and salt.  Take vitamin and mineral supplements as recommended by your health care provider.  Do not drink alcohol if your health care provider tells you not to drink.  If you drink alcohol: ? Limit how much you have to 0-2 drinks a day. ? Be aware of how much alcohol is in your drink. In the [place] one drink equals one 12 oz bottle of beer (355 mL), one 5 oz glass of [place] (148 mL), or one 1 oz glass of hard liquor (44 mL). Lifestyle  Take daily care of your teeth and gums.  Stay active. Exercise for at least 30 minutes on 5 or more days each week.  Do not use any products that contain nicotine or tobacco, such as cigarettes, e-cigarettes, and chewing tobacco. If you  need help quitting, ask your health care provider.  If you are sexually active, practice safe sex. Use a condom or other form of protection to prevent STIs (sexually transmitted infections).  Talk with your health care provider about taking a low-dose aspirin or statin. What's next?  Visit your health care provider once a year for a well check visit.  Ask your health care provider how often you should have your eyes and teeth checked.  Stay up to date on all vaccines. This information is not intended to replace advice given to you by your health care provider. Make sure you discuss any questions you have with your health care provider. Document Released: 12/19/2015 Document Revised: 11/16/2018 Document Reviewed: 11/16/2018 Elsevier Patient Education  2020 [employer].

## 2019-10-05 ENCOUNTER — Other Ambulatory Visit: Payer: Self-pay

## 2019-10-05 ENCOUNTER — Ambulatory Visit (INDEPENDENT_AMBULATORY_CARE_PROVIDER_SITE_OTHER): Payer: PPO | Admitting: General Practice

## 2019-10-05 DIAGNOSIS — Z7901 Long term (current) use of anticoagulants: Secondary | ICD-10-CM

## 2019-10-05 DIAGNOSIS — Z23 Encounter for immunization: Secondary | ICD-10-CM | POA: Diagnosis not present

## 2019-10-05 LAB — POCT INR: INR: 2.8 (ref 2.0–3.0)

## 2019-10-05 NOTE — Patient Instructions (Signed)
Pre visit review using our clinic review tool, if applicable. No additional management support is needed unless otherwise documented below in the visit note.  Continue to take 1 tablet daily.  Re-check in 5 weeks. Reduce greens intake.

## 2019-10-05 NOTE — Progress Notes (Signed)
Medical screening examination/treatment/procedure(s) were performed by non-physician practitioner and as supervising physician I was immediately available for consultation/collaboration. I agree with above. James John, MD   

## 2019-10-16 ENCOUNTER — Other Ambulatory Visit: Payer: Self-pay

## 2019-10-16 ENCOUNTER — Encounter: Payer: Self-pay | Admitting: Neurology

## 2019-10-16 ENCOUNTER — Ambulatory Visit (INDEPENDENT_AMBULATORY_CARE_PROVIDER_SITE_OTHER): Payer: PPO | Admitting: Neurology

## 2019-10-16 VITALS — BP 122/76 | HR 70 | Temp 97.1°F | Ht 74.0 in | Wt 279.5 lb

## 2019-10-16 DIAGNOSIS — M549 Dorsalgia, unspecified: Secondary | ICD-10-CM | POA: Diagnosis not present

## 2019-10-16 DIAGNOSIS — R519 Headache, unspecified: Secondary | ICD-10-CM | POA: Diagnosis not present

## 2019-10-16 MED ORDER — PREGABALIN 150 MG PO CAPS: 150.0000 mg | ORAL_CAPSULE | Freq: Two times a day (BID) | ORAL | 1 refills | Status: DC

## 2019-10-16 NOTE — Patient Instructions (Signed)
We will go up on the Lyrica to 150 mg twice a day.  Lyrica (pregabalin) may cause drowsiness, gait instability, ankle swelling, or cognitive slowing as well as possible dizziness. If any significant side effects occur associated with this medication, please contact our office.
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
## 2019-11-01 ENCOUNTER — Other Ambulatory Visit: Payer: Self-pay | Admitting: Internal Medicine

## 2019-11-09 ENCOUNTER — Ambulatory Visit (INDEPENDENT_AMBULATORY_CARE_PROVIDER_SITE_OTHER): Payer: PPO | Admitting: General Practice

## 2019-11-09 ENCOUNTER — Other Ambulatory Visit: Payer: Self-pay

## 2019-11-09 DIAGNOSIS — Z7901 Long term (current) use of anticoagulants: Secondary | ICD-10-CM | POA: Diagnosis not present

## 2019-11-09 LAB — POCT INR: INR: 3 (ref 2.0–3.0)

## 2019-11-09 NOTE — Patient Instructions (Addendum)
Pre visit review using our clinic review tool, if applicable. No additional management support is needed unless otherwise documented below in the visit note.  Continue to take 1 tablet daily.  Re-check in 5 weeks. Reduce greens intake.

## 2019-11-09 NOTE — Progress Notes (Signed)
Medical screening examination/treatment/procedure(s) were performed by non-physician practitioner and as supervising physician I was immediately available for consultation/collaboration. I agree with above. James John, MD   

## 2019-11-12 IMAGING — CR DG CHEST 2V
2 series · 2 of 2 positions shown · non-contrast
Comparison: Chest radiograph performed 12/21/2015

CLINICAL DATA: Acute onset of generalized chest pain.

EXAM:
CHEST - 2 VIEW

[chest pa]
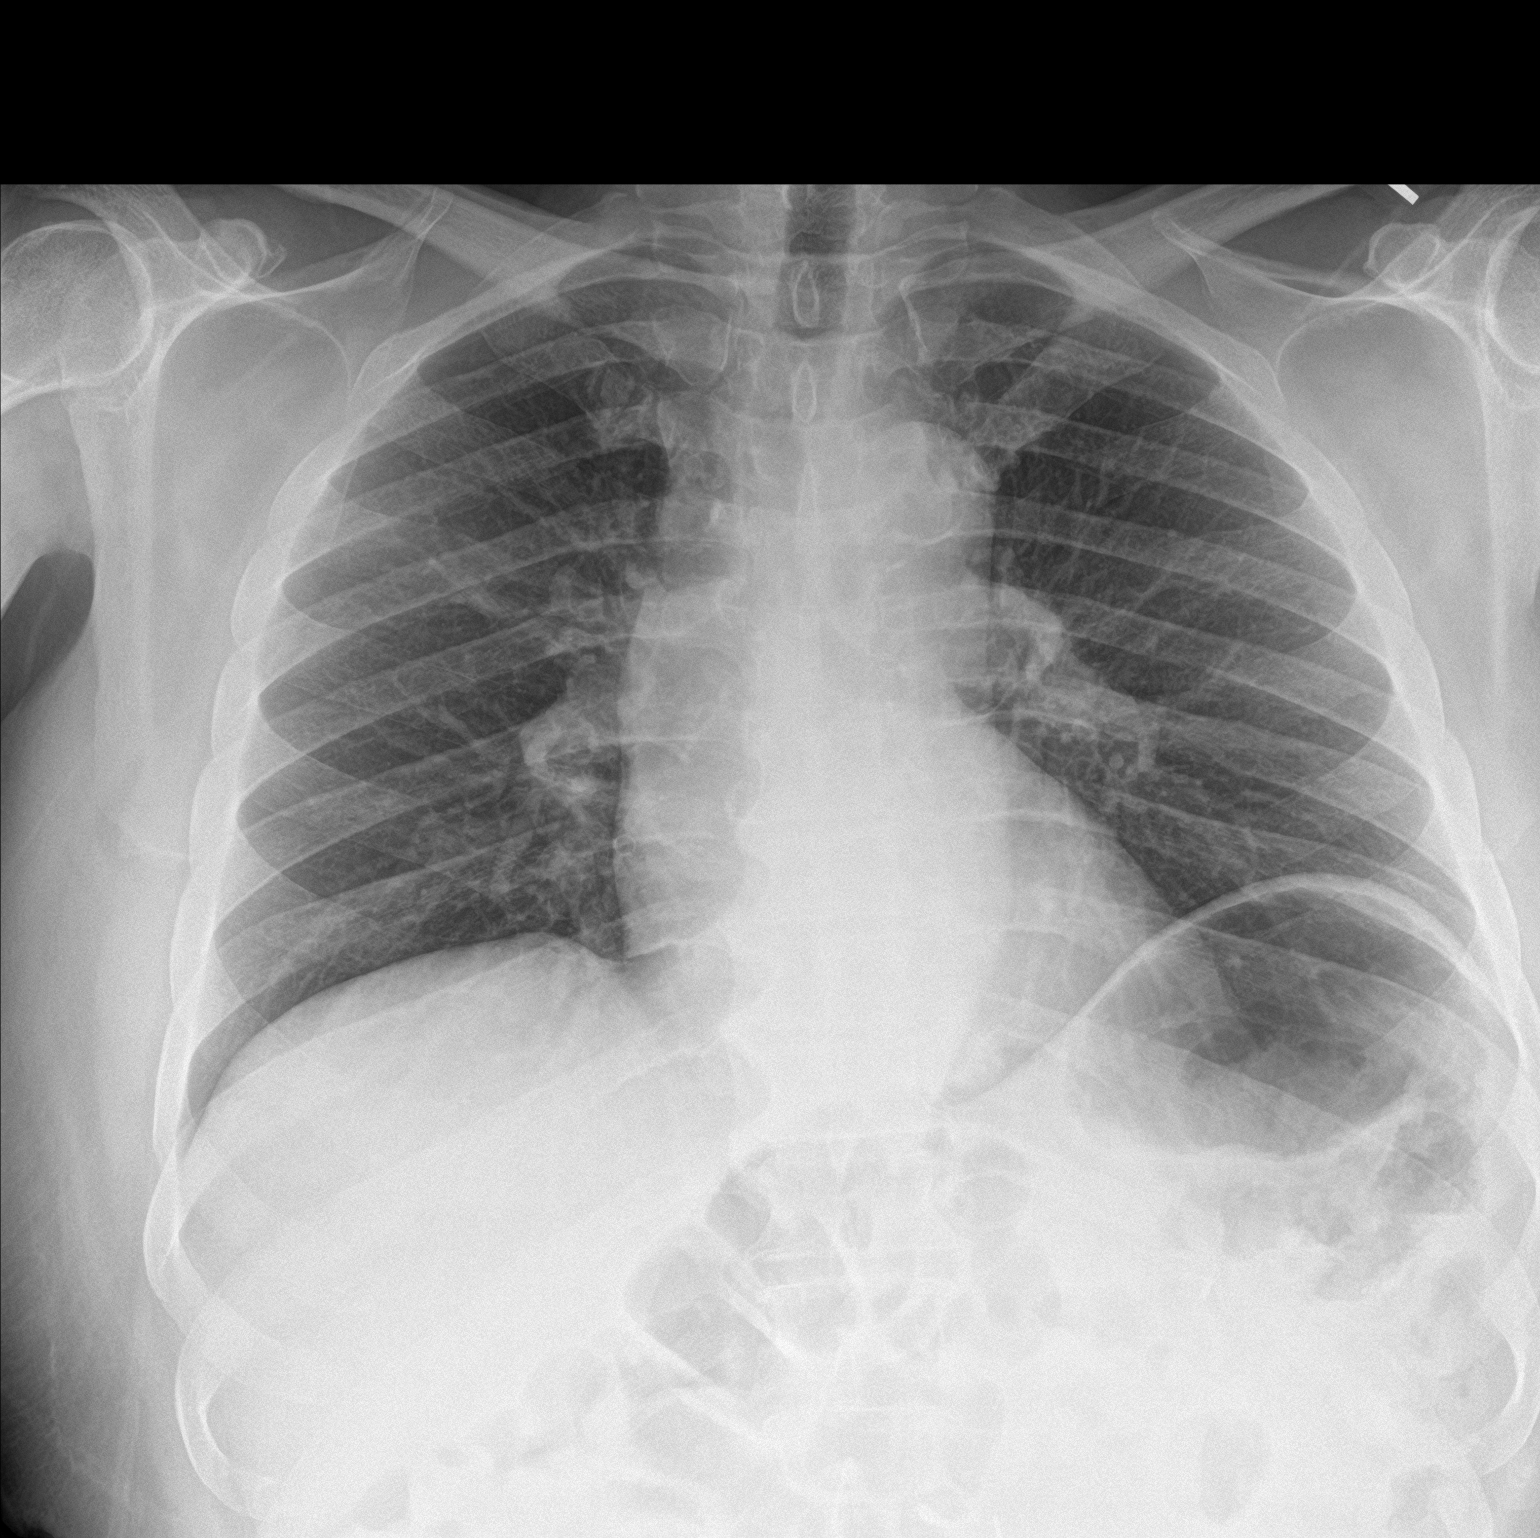

[chest lat]
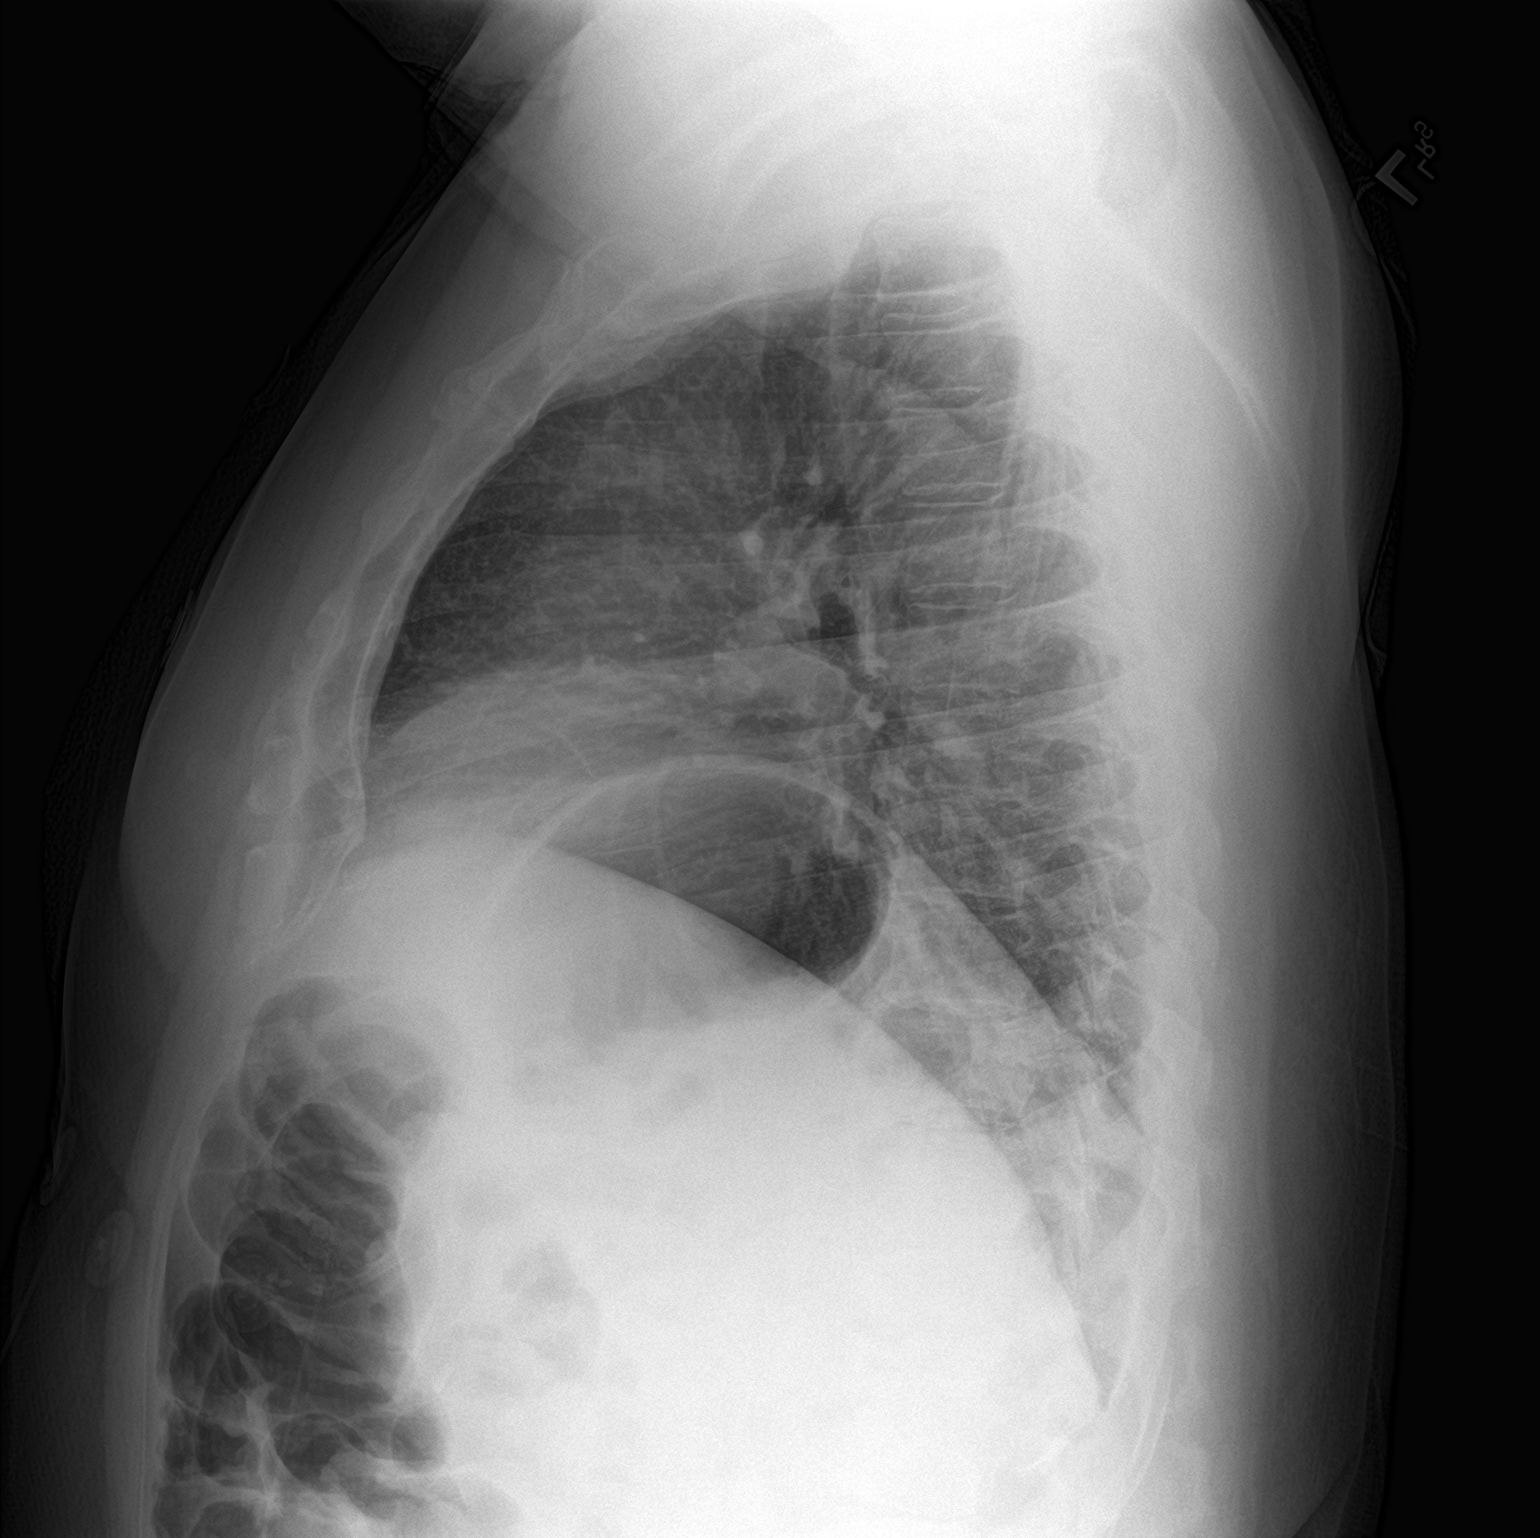

[2 of 2 positions shown; findings below may reference images not displayed]

FINDINGS: The lungs are well-aerated and clear. There is no evidence of focal
opacification, pleural effusion or pneumothorax.

The heart is normal in size; the mediastinal contour is within
normal limits. No acute osseous abnormalities are seen.
IMPRESSION: No acute cardiopulmonary process seen.

## 2019-11-22 DIAGNOSIS — Z0000 Encounter for general adult medical examination without abnormal findings: Secondary | ICD-10-CM | POA: Diagnosis not present

## 2019-12-21 ENCOUNTER — Other Ambulatory Visit: Payer: Self-pay

## 2019-12-21 ENCOUNTER — Ambulatory Visit (INDEPENDENT_AMBULATORY_CARE_PROVIDER_SITE_OTHER): Payer: PPO | Admitting: General Practice

## 2019-12-21 DIAGNOSIS — Z7901 Long term (current) use of anticoagulants: Secondary | ICD-10-CM | POA: Diagnosis not present

## 2019-12-21 LAB — POCT INR: INR: 2.1 (ref 2.0–3.0)

## 2019-12-21 NOTE — Patient Instructions (Addendum)
.  lbpcmh  Continue to take 1 tablet daily.  Re-check in 5 weeks. Reduce greens intake.

## 2019-12-21 NOTE — Progress Notes (Signed)
Medical screening examination/treatment/procedure(s) were performed by non-physician practitioner and as supervising physician I was immediately available for consultation/collaboration. I agree with above. Brigette Hopfer, MD   

## 2020-01-15 ENCOUNTER — Other Ambulatory Visit: Payer: Self-pay

## 2020-01-15 ENCOUNTER — Ambulatory Visit (INDEPENDENT_AMBULATORY_CARE_PROVIDER_SITE_OTHER): Payer: PPO | Admitting: Internal Medicine

## 2020-01-15 ENCOUNTER — Encounter: Payer: Self-pay | Admitting: Internal Medicine

## 2020-01-15 VITALS — BP 136/82 | HR 64 | Temp 98.4°F | Ht 74.0 in | Wt 285.0 lb

## 2020-01-15 DIAGNOSIS — E1122 Type 2 diabetes mellitus with diabetic chronic kidney disease: Secondary | ICD-10-CM

## 2020-01-15 DIAGNOSIS — Z0000 Encounter for general adult medical examination without abnormal findings: Secondary | ICD-10-CM | POA: Diagnosis not present

## 2020-01-15 DIAGNOSIS — E538 Deficiency of other specified B group vitamins: Secondary | ICD-10-CM

## 2020-01-15 DIAGNOSIS — N183 Chronic kidney disease, stage 3 unspecified: Secondary | ICD-10-CM | POA: Diagnosis not present

## 2020-01-15 DIAGNOSIS — E559 Vitamin D deficiency, unspecified: Secondary | ICD-10-CM | POA: Diagnosis not present

## 2020-01-15 DIAGNOSIS — E611 Iron deficiency: Secondary | ICD-10-CM | POA: Diagnosis not present

## 2020-01-15 NOTE — Patient Instructions (Signed)
Please see Sports Medicine on the First Floor about the left shoulder pain  Please make sure to get the COVID shot #2 as you have planned  You may wish to check with your insurance to see if they cover the Shingrix vaccine (for shingles) which can be done starting 2 wks later if you want this  Please continue all other medications as before, and refills have been done if requested.  Please have the pharmacy call with any other refills you may need.  Please continue your efforts at being more active, low cholesterol diet, and weight control.  You are otherwise up to date with prevention measures today.  Please keep your appointments with your specialists as you may have planned  Please go to the LAB at the blood drawing area for the tests to be done  You will be contacted by phone if any changes need to be made immediately.  Otherwise, you will receive a letter about your results with an explanation, but please check with MyChart first.  Please remember to sign up for MyChart if you have not done so, as this will be important to you in the future with finding out test results, communicating by private email, and scheduling acute appointments online when needed.  Please make an Appointment to return in 6 months, or sooner if needed

## 2020-01-15 NOTE — Assessment & Plan Note (Signed)

## 2020-01-15 NOTE — Progress Notes (Signed)
Subjective:    Patient ID: [name], male    DOB: [birth year], 64 y.o.   MRN: [id number]  HPI  Here for wellness and f/u;  Overall doing ok;  Pt denies Chest pain, worsening SOB, DOE, wheezing, orthopnea, PND, worsening LE edema, palpitations, dizziness or syncope.  Pt denies neurological change such as new headache, facial or extremity weakness.  Pt denies polydipsia, polyuria, or low sugar symptoms. Pt states overall good compliance with treatment and medications, good tolerability, and has been trying to follow appropriate diet.  Pt denies worsening depressive symptoms, suicidal ideation or panic. No fever, night sweats, wt loss, loss of appetite, or other constitutional symptoms.  Pt states good ability with ADL's, has low fall risk, home safety reviewed and adequate, no other significant changes in hearing or vision, and only occasionally active with exercise.  Gained 15 covid lbs Wt Readings from Last 3 Encounters:  01/15/20 285 lb (129.3 kg)  10/16/19 279 lb 8 oz (126.8 kg)  07/13/19 269 lb (122 kg)  S/p covid shot #1, due for #2 soon.  Also has recurrent left anterior shoulder pain, plans to see sports med Past Medical History:  Diagnosis Date  . BACK PAIN   . CAD in native artery    a. moderate by cardiac CT 2016.  Marland Kitchen COLONIC POLYPS, HX OF   . DEGENERATIVE JOINT DISEASE, KNEES, BILATERAL   . Depression   . Diabetes mellitus    type II  . DIVERTICULOSIS, COLON   . DVT, lower extremity ([place])    right  . Hyperlipidemia   . Hypertension   . HYPOGONADISM, MALE   . HYPOTENSION, ORTHOSTATIC   . Kidney stones   . Long term current use of anticoagulant   . Obesity   . Obstructive sleep apnea    uses cpap setting 8 to 10  . Pulmonary emboli ([place])   . Renal insufficiency    a. prior h/o, does not appear chronic.   Past Surgical History:  Procedure Laterality Date  . ankle surgury  2001 bilateral   with bone spurs and torn tendon  . EDG  2008   chronic Duodenitis  .  GASTRIC BYPASS    . HEEL SPUR SURGERY Bilateral 2005  . knee surgury  2000 & 2003    cartilage damage  . LUMBAR LAMINECTOMY/DECOMPRESSION MICRODISCECTOMY  09/28/2012   Procedure: LUMBAR LAMINECTOMY/DECOMPRESSION MICRODISCECTOMY 2 LEVELS;  Surgeon: [name], MD;  Location: [facility];  Service: Orthopedics;  Laterality: Left;  Decompressive laminectomy L4-L5. Microdiscectomy L5-S1  . REPLACEMENT TOTAL KNEE  2013  . TONSILLECTOMY AND ADENOIDECTOMY  age 52 or 58  . TOTAL KNEE ARTHROPLASTY  01/10/2012   Procedure: TOTAL KNEE ARTHROPLASTY;  Surgeon: [name], MD;  Location: [facility];  Service: Orthopedics;  Laterality: Left;    reports that he quit smoking about 41 years ago. He has a 1.25 pack-year smoking history. He has never used smokeless tobacco. He reports current alcohol use. He reports that he does not use drugs. family history includes Diabetes in his brother, sister, sister, and sister; Heart failure in his sister; Hypertension in his mother; Prostate cancer in his father. Allergies  Allergen Reactions  . Adhesive [Tape] Other (See Comments)    Painful, Please use "paper" tape  . Other     NO BLOOD -JEHOVAH'S WITNESS-SIGNED REFUSAL BUT WOULD TAKE ALBUMIN   Current Outpatient Medications on File Prior to Visit  Medication Sig Dispense Refill  . Artificial Tear Ointment (ARTIFICIAL  TEARS) ointment Place 1 drop into both eyes as needed (for dry eyes).    Marland Kitchen aspirin EC 81 MG tablet Take 81 mg by mouth daily.    Marland Kitchen atorvastatin (LIPITOR) 80 MG tablet TAKE 1 TABLET BY MOUTH EVERY DAY 90 tablet 1  . Calcium Carbonate (CALCI-CHEW PO) Take 1 tablet by mouth daily.     . chlorthalidone (HYGROTON) 25 MG tablet TAKE 1 TABLET BY MOUTH EVERY DAY 90 tablet 0  . Cyanocobalamin (VITAMIN B-12 SL) Place 1 tablet under the tongue daily.    . cyclobenzaprine (FLEXERIL) 5 MG tablet Take 1 tablet (5 mg total) by mouth 3 (three) times daily as needed for muscle spasms. 40 tablet 0  . Diclofenac Sodium  (PENNSAID) 2 % SOLN Place 1 application onto the skin 2 (two) times daily. 1 Bottle 3  . escitalopram (LEXAPRO) 10 MG tablet TAKE 1 TABLET BY MOUTH EVERY DAY 90 tablet 2  . Ferrous Sulfate (IRON) 90 (18 Fe) MG TABS Take 18 mg by mouth daily.    . fluticasone (FLONASE) 50 MCG/ACT nasal spray Place 2 sprays into both nostrils daily as needed for allergies.    . metFORMIN (GLUCOPHAGE) 500 MG tablet TAKE 2 TABLETS BY MOUTH EVERY DAY WITH BREAKFAST 180 tablet 1  . Multiple Vitamin (MULTIVITAMIN WITH MINERALS) TABS tablet Take 1 tablet by mouth daily.    . nitroGLYCERIN (NITROSTAT) 0.4 MG SL tablet Place 1 tablet (0.4 mg total) under the tongue every 5 (five) minutes as needed for chest pain. 90 tablet 3  . pregabalin (LYRICA) 150 MG capsule Take 1 capsule (150 mg total) by mouth 2 (two) times daily. 180 capsule 1  . SENNA CO by Combination route. As needed for constipation.    . SENNA-PLUS 8.6-50 MG tablet TAKE 1 TABLET BY MOUTH DAILY AS NEEDED FOR MILD CONSTIPATION. REPORTED ON 03/22/2016 90 tablet 1  . warfarin (COUMADIN) 10 MG tablet TAKE 1 TABLET DAILY EXCEPT 2 TABLETS ON WED OR TAKE AS DIRECTED BY ANTICOAGULATION CLINIC 120 tablet 1   No current facility-administered medications on file prior to visit.   Review of Systems All otherwise neg per pt     Objective:   Physical Exam BP 136/82   Pulse 64   Temp 98.4 F (36.9 C)   Ht 6\' 2"  (1.88 m)   Wt 285 lb (129.3 kg)   SpO2 98%   BMI 36.59 kg/m  VS noted,  Constitutional: Pt appears in NAD HENT: Head: NCAT.  Right Ear: External ear normal.  Left Ear: External ear normal.  Eyes: . Pupils are equal, round, and reactive to light. Conjunctivae and EOM are normal Nose: without d/c or deformity Neck: Neck supple. Gross normal ROM Cardiovascular: Normal rate and regular rhythm.   Pulmonary/Chest: Effort normal and breath sounds without rales or wheezing.  Abd:  Soft, NT, ND, + BS, no organomegaly Neurological: Pt is alert. At baseline  orientation, motor grossly intact Skin: Skin is warm. No rashes, other new lesions, no LE edema Psychiatric: Pt behavior is normal without agitation  All otherwise neg per pt Lab Results  Component Value Date   WBC 6.8 12/11/2018   HGB 13.0 12/11/2018   HCT 41.6 12/11/2018   PLT 209.0 12/11/2018   GLUCOSE 75 12/11/2018   CHOL 132 12/11/2018   TRIG 130.0 12/11/2018   HDL 39.80 12/11/2018   LDLDIRECT 121.2 05/25/2011   LDLCALC 67 12/11/2018   ALT 33 12/11/2018   AST 22 12/11/2018   NA 140 12/11/2018  K 4.2 12/11/2018   CL 104 12/11/2018   CREATININE 1.15 12/11/2018   BUN 22 12/11/2018   CO2 28 12/11/2018   TSH 1.50 12/11/2018   PSA 2.33 12/11/2018   INR 2.1 12/21/2019   HGBA1C 7.4 (A) 07/13/2019   MICROALBUR 1.0 12/11/2018      Assessment & Plan:

## 2020-01-15 NOTE — Assessment & Plan Note (Signed)
stable overall by history and exam, recent data reviewed with pt, and pt to continue medical treatment as before,  to f/u any worsening symptoms or concerns  

## 2020-01-16 LAB — LIPID PANEL
Cholesterol: 216 mg/dL — ABNORMAL HIGH (ref 0–200)
HDL: 40.1 mg/dL (ref >39.00)
NonHDL: 176.13
Total CHOL/HDL Ratio: 5
Triglycerides: 240 mg/dL — ABNORMAL HIGH (ref 0.0–149.0)
VLDL: 48 mg/dL — ABNORMAL HIGH (ref 0.0–40.0)

## 2020-01-16 LAB — BASIC METABOLIC PANEL
BUN: 26 mg/dL — ABNORMAL HIGH (ref 6–23)
CO2: 30 mEq/L (ref 19–32)
Calcium: 9.1 mg/dL (ref 8.4–10.5)
Chloride: 101 mEq/L (ref 96–112)
Creatinine, Ser: 1.39 mg/dL (ref 0.40–1.50)
GFR: 61.85 mL/min (ref >60.00)
Glucose, Bld: 129 mg/dL — ABNORMAL HIGH (ref 70–99)
Potassium: 4 mEq/L (ref 3.5–5.1)
Sodium: 139 mEq/L (ref 135–145)

## 2020-01-16 LAB — URINALYSIS, ROUTINE W REFLEX MICROSCOPIC
Bilirubin Urine: NEGATIVE
Hgb urine dipstick: NEGATIVE
Ketones, ur: NEGATIVE
Leukocytes,Ua: NEGATIVE
Nitrite: NEGATIVE
RBC / HPF: NONE SEEN (ref 0–?)
Specific Gravity, Urine: 1.025 (ref 1.000–1.030)
Total Protein, Urine: NEGATIVE
Urine Glucose: NEGATIVE
Urobilinogen, UA: 0.2 (ref 0.0–1.0)
pH: 5.5 (ref 5.0–8.0)

## 2020-01-16 LAB — CBC WITH DIFFERENTIAL/PLATELET
Basophils Absolute: 0.1 10*3/uL (ref 0.0–0.1)
Basophils Relative: 1.5 % (ref 0.0–3.0)
Eosinophils Absolute: 0.1 10*3/uL (ref 0.0–0.7)
Eosinophils Relative: 1.3 % (ref 0.0–5.0)
HCT: 42.3 % (ref 39.0–52.0)
Hemoglobin: 13.1 g/dL (ref 13.0–17.0)
Lymphocytes Relative: 32.3 % (ref 12.0–46.0)
Lymphs Abs: 2.2 10*3/uL (ref 0.7–4.0)
MCHC: 30.9 g/dL (ref 30.0–36.0)
MCV: 71.9 fl — ABNORMAL LOW (ref 78.0–100.0)
Monocytes Absolute: 0.5 10*3/uL (ref 0.1–1.0)
Monocytes Relative: 7.7 % (ref 3.0–12.0)
Neutro Abs: 3.8 10*3/uL (ref 1.4–7.7)
Neutrophils Relative %: 57.2 % (ref 43.0–77.0)
Platelets: 163 10*3/uL (ref 150.0–400.0)
RBC: 5.88 Mil/uL — ABNORMAL HIGH (ref 4.22–5.81)
RDW: 15.2 % (ref 11.5–15.5)
WBC: 6.7 10*3/uL (ref 4.0–10.5)

## 2020-01-16 LAB — HEPATIC FUNCTION PANEL
ALT: 20 U/L (ref 0–53)
AST: 17 U/L (ref 0–37)
Albumin: 4.2 g/dL (ref 3.5–5.2)
Alkaline Phosphatase: 75 U/L (ref 39–117)
Bilirubin, Direct: 0.1 mg/dL (ref 0.0–0.3)
Total Bilirubin: 0.3 mg/dL (ref 0.2–1.2)
Total Protein: 6.7 g/dL (ref 6.0–8.3)

## 2020-01-16 LAB — IBC PANEL
Iron: 67 ug/dL (ref 42–165)
Saturation Ratios: 18 % — ABNORMAL LOW (ref 20.0–50.0)
Transferrin: 266 mg/dL (ref 212.0–360.0)

## 2020-01-16 LAB — HEMOGLOBIN A1C: Hgb A1c MFr Bld: 8.8 % — ABNORMAL HIGH (ref 4.6–6.5)

## 2020-01-16 LAB — VITAMIN D 25 HYDROXY (VIT D DEFICIENCY, FRACTURES): VITD: 9.09 ng/mL — ABNORMAL LOW (ref 30.00–100.00)

## 2020-01-16 LAB — LDL CHOLESTEROL, DIRECT: Direct LDL: 116 mg/dL

## 2020-01-16 LAB — MICROALBUMIN / CREATININE URINE RATIO
Creatinine,U: 145.7 mg/dL
Microalb Creat Ratio: 1.5 mg/g (ref 0.0–30.0)
Microalb, Ur: 2.1 mg/dL — ABNORMAL HIGH (ref 0.0–1.9)

## 2020-01-16 LAB — TSH: TSH: 2.05 u[IU]/mL (ref 0.35–4.50)

## 2020-01-16 LAB — PSA: PSA: 2.92 ng/mL (ref 0.10–4.00)

## 2020-01-16 LAB — VITAMIN B12: Vitamin B-12: >1500 pg/mL — ABNORMAL HIGH (ref 211–911)

## 2020-01-18 ENCOUNTER — Other Ambulatory Visit: Payer: Self-pay | Admitting: Internal Medicine

## 2020-01-18 MED ORDER — METFORMIN HCL 500 MG PO TABS: 2000.0000 mg | ORAL_TABLET | Freq: Every day | ORAL | 3 refills | Status: DC

## 2020-01-18 MED ORDER — VITAMIN D (ERGOCALCIFEROL) 1.25 MG (50000 UNIT) PO CAPS: 50000.0000 [IU] | ORAL_CAPSULE | ORAL | 0 refills | Status: DC

## 2020-01-21 ENCOUNTER — Telehealth: Payer: Self-pay | Admitting: Internal Medicine

## 2020-01-21 ENCOUNTER — Ambulatory Visit: Payer: Self-pay

## 2020-01-21 ENCOUNTER — Encounter: Payer: Self-pay | Admitting: Family Medicine

## 2020-01-21 ENCOUNTER — Ambulatory Visit: Payer: PPO | Admitting: Family Medicine

## 2020-01-21 VITALS — BP 112/70 | HR 69 | Ht 74.0 in | Wt 288.6 lb

## 2020-01-21 DIAGNOSIS — M25512 Pain in left shoulder: Secondary | ICD-10-CM

## 2020-01-21 NOTE — Telephone Encounter (Signed)
Spoke with patient and went over results with him. All questions answered.

## 2020-01-21 NOTE — Progress Notes (Signed)
Subjective:    I'm seeing this patient as a consultation for:  Dr. Jenny Reichmann. Note will be routed back to referring provider/PCP.  CC: L shoulder pain  I, Molly Weber, LAT, ATC, am serving as scribe for Dr. Lynne Leader.  HPI: Pt is a 64 y/o male presenting w/ L shoulder pain x one month w/ no kown MOI.  He rates his pain at a 4/10 and describes his pain as aching.  He denies any injury.  Radiating pain: No Mechanical L shoulder symptoms: No L UE weakness: No Aggravating factors: Laying on his L side;  Functional IR; keeping L shoulder in a functional ER position for an extended period Treatments tried: IcyHot   Past medical history, Surgical history, Family history, Social history, Allergies, and medications have been entered into the medical record, reviewed.   Review of Systems: No new headache, visual changes, nausea, vomiting, diarrhea, constipation, dizziness, abdominal pain, skin rash, fevers, chills, night sweats, weight loss, swollen lymph nodes, body aches, joint swelling, muscle aches, chest pain, shortness of breath, mood changes, visual or auditory hallucinations.   Objective:    Vitals:   01/21/20 1336  BP: 112/70  Pulse: 69  SpO2: 97%   General: Well Developed, well nourished, and in no acute distress.  Neuro/Psych: Alert and oriented x3, extra-ocular muscles intact, able to move all 4 extremities, sensation grossly intact. Skin: Warm and dry, no rashes noted.  Respiratory: Not using accessory muscles, speaking in full sentences, trachea midline.  Cardiovascular: Pulses palpable, no extremity edema. Abdomen: Does not appear distended. MSK:  Left shoulder: Normal-appearing. Nontender Range of motion: Abduction full.  External rotation full.  Internal rotation to lumbar spine. Strength: Abduction 4+/5.  External rotation 4+/5.  Internal rotation 5/5. Positive Hawkins and Neer's test. Positive empty can test. Negative Yergason's and speeds test.  Contralateral  right shoulder normal-appearing nontender normal motion normal strength negative impingement and biceps tendinitis testing.  Lab and Radiology Results Diagnostic Limited MSK Ultrasound of: Left shoulder  Biceps tendon: Intact in bicipital groove normal-appearing. Subscapularis tendon intact no visible tear. Supraspinatus tendon hypoechoic fluid tracking superficial to supraspinatus tendon consistent with subacromial bursitis. Slight hypoechoic change at distal portion of supraspinatus tendon possible partial tear difficult to fully tell.  Area does not show significant change with Doppler examination. Infraspinatus tendon intact.  Superior portion of tendon shows hypoechoic change consistent with bursitis. AC joint mild effusion.  Impression: Subacromial bursitis supraspinatus tendinitis possible partial tear.   Impression and Recommendations:    Assessment and Plan: 64 y.o. male with left shoulder pain ongoing for about 1 month.  Physical exam and ultrasound exam shows subacromial bursitis and tendinopathy.  Partial tear supraspinatus tendon is a possibility on ultrasound but not confirmed. After discussion of plan with patient proceed with trial of physical therapy.  Home exercises reviewed with patient by ATC in clinic today. Plan to recheck in about a month.  Return sooner if needed.  Precautions reviewed.  If not better neck step would be injection.Marland Kitchen  PDMP not reviewed this encounter. Orders Placed This Encounter  Procedures  . [place] LIMITED JOINT SPACE STRUCTURES UP LEFT(NO LINKED CHARGES)    Order Specific Question:   Reason for Exam (SYMPTOM  OR DIAGNOSIS REQUIRED)    Answer:   L shoulder pain    Order Specific Question:   Preferred imaging location?    Answer:   [facility]  . Ambulatory referral to Physical Therapy    Referral Priority:  Routine    Referral Type:   Physical Medicine    Referral Reason:   Specialty Services Required    Requested  Specialty:   Physical Therapy   No orders of the defined types were placed in this encounter.   Discussed warning signs or symptoms. Please see discharge instructions. Patient expresses understanding.   The above documentation has been reviewed and is accurate and complete Lynne Leader

## 2020-01-21 NOTE — Telephone Encounter (Signed)
° °  Patient calling to discuss lab results Please call 

## 2020-01-21 NOTE — Patient Instructions (Addendum)
Thank you for coming in today. Attend PT.  Do the exercises Molly showed.  Recheck in about 1 month.  Return sooner if needed.   Please perform the exercise program that we have prepared for you and gone over in detail on a daily basis.  In addition to the handout you were provided you can access your program through: [email or web address]   Your unique program code is:  [email or web address]

## 2020-01-22 DIAGNOSIS — Z96651 Presence of right artificial knee joint: Secondary | ICD-10-CM | POA: Diagnosis not present

## 2020-01-22 DIAGNOSIS — M1711 Unilateral primary osteoarthritis, right knee: Secondary | ICD-10-CM | POA: Diagnosis not present

## 2020-01-25 ENCOUNTER — Other Ambulatory Visit: Payer: Self-pay | Admitting: *Deleted

## 2020-01-25 MED ORDER — CHLORTHALIDONE 25 MG PO TABS: 25.0000 mg | ORAL_TABLET | Freq: Every day | ORAL | 1 refills | Status: DC

## 2020-01-29 ENCOUNTER — Ambulatory Visit (INDEPENDENT_AMBULATORY_CARE_PROVIDER_SITE_OTHER): Payer: PPO | Admitting: General Practice

## 2020-01-29 DIAGNOSIS — I2699 Other pulmonary embolism without acute cor pulmonale: Secondary | ICD-10-CM

## 2020-01-29 DIAGNOSIS — Z7901 Long term (current) use of anticoagulants: Secondary | ICD-10-CM

## 2020-01-29 NOTE — Patient Instructions (Incomplete)
Pre visit review using our clinic review tool, if applicable. No additional management support is needed unless otherwise documented below in the visit note. 

## 2020-01-29 NOTE — Progress Notes (Signed)
Medical screening examination/treatment/procedure(s) were performed by non-physician practitioner and as supervising physician I was immediately available for consultation/collaboration. I agree with above. Romir Klimowicz, MD   

## 2020-01-30 ENCOUNTER — Other Ambulatory Visit: Payer: Self-pay | Admitting: General Practice

## 2020-01-30 ENCOUNTER — Other Ambulatory Visit: Payer: Self-pay

## 2020-01-30 DIAGNOSIS — Z7901 Long term (current) use of anticoagulants: Secondary | ICD-10-CM

## 2020-01-30 MED ORDER — WARFARIN SODIUM 10 MG PO TABS: ORAL_TABLET | ORAL | 1 refills | Status: DC

## 2020-02-01 ENCOUNTER — Other Ambulatory Visit: Payer: Self-pay

## 2020-02-01 ENCOUNTER — Ambulatory Visit (INDEPENDENT_AMBULATORY_CARE_PROVIDER_SITE_OTHER): Payer: PPO | Admitting: General Practice

## 2020-02-01 DIAGNOSIS — Z7901 Long term (current) use of anticoagulants: Secondary | ICD-10-CM

## 2020-02-01 LAB — POCT INR: INR: 2.9 (ref 2.0–3.0)

## 2020-02-01 NOTE — Progress Notes (Signed)
Medical screening examination/treatment/procedure(s) were performed by non-physician practitioner and as supervising physician I was immediately available for consultation/collaboration. I agree with above. Ermagene Saidi, MD   

## 2020-02-01 NOTE — Patient Instructions (Addendum)
Pre visit review using our clinic review tool, if applicable. No additional management support is needed unless otherwise documented below in the visit note.  Continue to take 1 tablet daily.  Re-check in 6 weeks.  

## 2020-02-12 ENCOUNTER — Other Ambulatory Visit: Payer: Self-pay

## 2020-02-12 ENCOUNTER — Ambulatory Visit: Payer: PPO | Attending: Family Medicine | Admitting: Physical Therapy

## 2020-02-12 ENCOUNTER — Encounter: Payer: Self-pay | Admitting: Physical Therapy

## 2020-02-12 DIAGNOSIS — M6281 Muscle weakness (generalized): Secondary | ICD-10-CM | POA: Insufficient documentation

## 2020-02-12 DIAGNOSIS — M25512 Pain in left shoulder: Secondary | ICD-10-CM | POA: Diagnosis not present

## 2020-02-12 NOTE — Patient Instructions (Signed)
Access Code: [email or web address] URL: [email or web address] Date: 02/12/2020 Prepared by: Jeral Pinch  Exercises Isometric Shoulder External Rotation at Wall - 5-10 reps - 10 hold - 1x daily Standing Isometric Shoulder Internal Rotation at Doorway - 5-10 reps - 10 hold - 1x daily Isometric Shoulder Extension at Wall - 5-10 reps - 10 hold - 1x daily Scapular Retraction with Resistance - 10 reps - 3 sets - 1x daily Doorway Pec Stretch at 60 Elevation - 2-3 reps - 30 hold - 1x daily

## 2020-02-12 NOTE — Therapy (Addendum)
[place] [address] [place], [place], [address] Phone: [phone]   Fax:  [phone]  Physical Therapy Evaluation  Patient Details  Name: [name] MRN: [id number] Date of Birth: 04-07-1956 Referring Provider (PT): Dr Lynne Leader   Encounter Date: 02/12/2020  PT End of Session - 02/12/20 0919    Visit Number  1    Number of Visits  6    Date for PT Re-Evaluation  03/25/20    PT Start Time  0919    PT Stop Time  1001    PT Time Calculation (min)  42 min    Activity Tolerance  Patient tolerated treatment well    Behavior During Therapy  [facility] for tasks assessed/performed       Past Medical History:  Diagnosis Date  . BACK PAIN   . CAD in native artery    a. moderate by cardiac CT 2016.  Marland Kitchen COLONIC POLYPS, HX OF   . DEGENERATIVE JOINT DISEASE, KNEES, BILATERAL   . Depression   . Diabetes mellitus    type II  . DIVERTICULOSIS, COLON   . DVT, lower extremity ([place])    right  . Hyperlipidemia   . Hypertension   . HYPOGONADISM, MALE   . HYPOTENSION, ORTHOSTATIC   . Kidney stones   . Long term current use of anticoagulant   . Obesity   . Obstructive sleep apnea    uses cpap setting 8 to 10  . Pulmonary emboli ([place])   . Renal insufficiency    a. prior h/o, does not appear chronic.    Past Surgical History:  Procedure Laterality Date  . ankle surgury  2001 bilateral   with bone spurs and torn tendon  . EDG  2008   chronic Duodenitis  . GASTRIC BYPASS    . HEEL SPUR SURGERY Bilateral 2005  . knee surgury  2000 & 2003    cartilage damage  . LUMBAR LAMINECTOMY/DECOMPRESSION MICRODISCECTOMY  09/28/2012   Procedure: LUMBAR LAMINECTOMY/DECOMPRESSION MICRODISCECTOMY 2 LEVELS;  Surgeon: [name], MD;  Location: [facility];  Service: Orthopedics;  Laterality: Left;  Decompressive laminectomy L4-L5. Microdiscectomy L5-S1  . REPLACEMENT TOTAL KNEE  2013  . TONSILLECTOMY AND ADENOIDECTOMY  age 75 or 15   . TOTAL KNEE ARTHROPLASTY  01/10/2012   Procedure: TOTAL KNEE ARTHROPLASTY;  Surgeon: [name], MD;  Location: [facility];  Service: Orthopedics;  Laterality: Left;    There were no vitals filed for this visit.   Subjective Assessment - 02/12/20 0919    Subjective  Pt reports a couple month h/o Lt shoulder pain.  He has been doing some band exercise ( red) seems to help some.    Diagnostic tests  none    Patient Stated Goals  get painfree movement    Currently in Pain?  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, driving range       Observation/Other Assessments   Focus on Therapeutic Outcomes (FOTO)   45% limited      Posture/Postural Control   Posture/Postural Control  No significant limitations      ROM / Strength   AROM / PROM / Strength  AROM;Strength      AROM   AROM Assessment Site  Shoulder;Cervical    Right/Left Shoulder  --   bilat WFL, pain endrange Lt flex, abduction , behind back   Cervical Flexion  WNL    Cervical Extension  WNL    Cervical - Right Rotation  WNL    Cervical - Left Rotation  WNL      Strength   Strength Assessment Site  Shoulder;Elbow    Right/Left Shoulder  --   Rt WNL, Lt 4-/5 with pain, except IR 5/5     Flexibility   Soft Tissue Assessment /Muscle Length  --   slight tightness bilat pecs     Palpation   Palpation comment  tender anterior Lt shoulder and AC jiont      Special Tests    Special Tests  Rotator Cuff Impingement     Rotator Cuff Impingment tests  Michel Bickers test      Hawkins-Kennedy test   Findings  Positive    Side  Left                Objective measurements completed on examination: See above findings.      [facility] Adult PT Treatment/Exercise - 02/12/20 0001      Exercises   Exercises  Shoulder      Shoulder Exercises: Standing   Row  Strengthening;Both;Theraband   3x10, VC for form   Theraband Level (Shoulder Row)  Level 2 (Red)      Shoulder Exercises: Isometric Strengthening   Extension  5X10"    External Rotation  5X10"   towel under elbow   Internal Rotation  5X10"   towel under elbow            PT Education - 02/12/20 1021    Education Details  POC and HEP    Person(s) Educated  Patient    Methods  Explanation;Demonstration;Handout    Comprehension  Verbalized understanding;Returned demonstration          PT Long Term Goals - 02/12/20 1008      PT LONG TERM GOAL #1   Title  I with advanced HEP ( 03/25/2020)    Time  6    Period  Weeks    Status  New    Target Date  03/25/20      PT LONG TERM GOAL #2   Title  improve Lt shoulder strength =/> Rt without pain  ( 03/25/2020)    Time  6    Period  Weeks    Status  New    Target Date  03/25/20      PT LONG TERM GOAL #3   Title  improved FOTO =/< 33% limited ( 03/25/2020)    Time  6    Period  Weeks    Status  New    Target Date  03/25/20      PT LONG TERM GOAL #4   Title  report pain decrease in Lt shoulder to allow him to sleep per  his previous level ( 03/25/2020)    Time  6    Period  Weeks    Status  New    Target Date  03/25/20             Plan - 02/12/20 1004    Clinical Impression Statement  64 yo male with a couple month h/o Lt shoulder pain that is now interferring with is ability to sleep and lift overhead.  He has been doing exercise with red band at home that his MD issued.  He has pain with these so HEP was modified today.  He reports pain at endrange of Lt shoulder  motion, weakness is present in almost all motions of the Lt shoudler with pain.  Posture is good.  He would benefit from PT to address the weakness and education on proper mechanics of the shoulder ot protect from further injury.    Personal Factors and Comorbidities  Comorbidity 3+    Examination-Activity Limitations  Other;Carry    Examination-Participation Restrictions  Other    Stability/Clinical Decision Making  Stable/Uncomplicated    Clinical Decision Making  Low    Rehab Potential  Excellent    PT Frequency  1x / week    PT Duration  6 weeks    PT Treatment/Interventions  Iontophoresis 4mg /ml Dexamethasone;Taping;Vasopneumatic Device;Patient/family education;Cryotherapy;Electrical Stimulation;Manual techniques;Dry needling;Therapeutic exercise    PT Next Visit Plan  scapular and shoulder stability, may need ionto to LT [facility] joint    Consulted and Agree with Plan of Care  Patient       Patient will benefit from skilled therapeutic intervention in order to improve the following deficits and impairments:  Pain, Impaired UE functional use, Decreased strength  Visit Diagnosis: Acute pain of left shoulder - Plan: PT plan of care cert/re-cert  Muscle weakness (generalized) - Plan: PT plan of care cert/re-cert     Problem List Patient Active Problem List   Diagnosis Date Noted  . Chronic bilateral low back pain with bilateral sciatica 01/12/2019  . Headache 12/11/2018  . AKI (acute kidney injury) ([place]) 12/11/2018  . Nosebleed 12/11/2018  . Maxillary sinus cyst 12/11/2018  . Left leg pain 12/11/2018  . Left hip pain 12/01/2018  . Acute upper respiratory infection 08/17/2018  . Increased prostate specific antigen (PSA) velocity 07/12/2018  . Pain in lower jaw 03/23/2018  . Pain of left heel 12/21/2017  . Long term (current) use of anticoagulants 08/17/2017  . RBBB 07/28/2017  . CAD (coronary artery disease) 06/28/2017  . Degenerative arthritis of right knee 07/15/2016  . Acute  bursitis of right shoulder 07/15/2016  . Right shoulder pain 06/24/2016  . Right knee pain 06/24/2016  . Superficial phlebitis of arm 03/18/2016  . Abdominal pain 12/23/2015  . Rash 10/29/2015  . Chest pain 03/27/2015  . Dysuria 03/27/2015  . Diabetes ([place]) 10/01/2014  . Nipple pain 03/13/2014  . Breast mass in male 03/13/2014  . Encounter for therapeutic drug monitoring 01/25/2014  . Peripheral edema 10/10/2013  . OSA (obstructive sleep apnea) 09/11/2013  . Lesion of penis 01/01/2013  . Lumbosacral radiculopathy at S1 06/14/2012  . S/P left knee replacement 01/10/2012  . Preventative health care 12/16/2011  . Back pain with radiation 09/18/2011  . Long term current use of anticoagulant 04/08/2011  . Pulmonary embolism ([place]) 02/05/2011  . HYPOTENSION, ORTHOSTATIC 01/29/2010  . CONSTIPATION 01/21/2010  . DEGENERATIVE JOINT DISEASE, KNEES, BILATERAL 11/17/2009  . SMOKER 09/12/2009  . HYPOGONADISM, MALE 03/04/2008  . OBESITY, MORBID 03/04/2008  .  Depression 10/05/2007  . DIVERTICULOSIS, COLON 10/05/2007  . RENAL INSUFFICIENCY 10/05/2007  . COLONIC POLYPS, HX OF 10/05/2007  . Hyperlipidemia 06/30/2007  . Essential hypertension 06/30/2007    Jeral Pinch PT  02/12/2020, 10:22 AM  [place], [place], [address] Phone: [phone]   Fax:  [phone]  Name: [name] MRN: [id number] Date of Birth: 1956/07/03

## 2020-02-18 ENCOUNTER — Other Ambulatory Visit: Payer: Self-pay

## 2020-02-18 ENCOUNTER — Encounter: Payer: Self-pay | Admitting: Family Medicine

## 2020-02-18 ENCOUNTER — Ambulatory Visit: Payer: PPO | Admitting: Family Medicine

## 2020-02-18 VITALS — BP 120/78 | HR 63 | Ht 74.0 in | Wt 284.2 lb

## 2020-02-18 DIAGNOSIS — M25512 Pain in left shoulder: Secondary | ICD-10-CM

## 2020-02-18 NOTE — Patient Instructions (Signed)
Thank you for coming in today. Plan to continue PT and home exercise.  Recheck with me in 1 month if not better.   Let me know sooner if not doing well.

## 2020-02-18 NOTE — Progress Notes (Signed)
   I, [name], LAT, ATC, am serving as scribe for Dr. Lynne Leader.  [name] is a 64 y.o. male who presents to [place] at [facility] today for f/u of his L shoulder pain.  He was last seen by Dr. Georgina Snell on 01/21/20 and was c/o 4/10 aching pain.  He was provided a HEP consisting of L shoulder, RC and periscapular strengthening and was referred to outpatient PT.  He has completed one PT visit.  Since his last visit, pt reports that his L shoulder is feeling slightly improved and locates the majority of his pain to his L anterior shoulder.  He states that he has a f/u appt w/ PT next week.  He rates his current improvement at 50%.   Pertinent review of systems: No fevers or chills  Relevant historical information: History of pulmonary embolism.  History diabetes   Exam:  BP 120/78 (BP Location: Left Arm, Patient Position: Sitting, Cuff Size: Large)   Pulse 63   Ht 6\' 2"  (1.88 m)   Wt 284 lb 3.2 oz (128.9 kg)   SpO2 96%   BMI 36.49 kg/m  General: Well Developed, well nourished, and in no acute distress.   MSK: Left shoulder: Normal-appearing normal motion. Strength abduction 4/5.  External rotation internal rotation strength 5/5. Mildly positive empty can and Hawkins and Neer's test.      Assessment and Plan: 64 y.o. male with left shoulder pain 50% improved with 1 month and 1 physical therapy session.  Patient has further physical therapy pending.  After discussion with patient plan to proceed with further physical therapy and recheck with me in 1 month if not better.  If not better next step is probably injection versus MRI.  We will discuss this further at next visit if needed.   Discussed warning signs or symptoms. Please see discharge instructions. Patient expresses understanding.   The above documentation has been reviewed and is accurate and complete Lynne Leader

## 2020-02-22 ENCOUNTER — Ambulatory Visit: Payer: PPO | Admitting: Physical Therapy

## 2020-02-22 ENCOUNTER — Encounter: Payer: Self-pay | Admitting: Physical Therapy

## 2020-02-22 ENCOUNTER — Other Ambulatory Visit: Payer: Self-pay

## 2020-02-22 DIAGNOSIS — M6281 Muscle weakness (generalized): Secondary | ICD-10-CM

## 2020-02-22 DIAGNOSIS — M25512 Pain in left shoulder: Secondary | ICD-10-CM

## 2020-02-22 NOTE — Therapy (Signed)
[place], [place], [address] Phone: [phone]   Fax:  [phone]  Physical Therapy Treatment  Patient Details  Name: [name] MRN: [id number] Date of Birth: October 04, 1956 Referring Provider (PT): Dr Lynne Leader   Encounter Date: 02/22/2020  PT End of Session - 02/22/20 1104    Visit Number  2    Number of Visits  6    Date for PT Re-Evaluation  03/25/20    PT Start Time  1104    PT Stop Time  1140    PT Time Calculation (min)  36 min    Activity Tolerance  Patient tolerated treatment well    Behavior During Therapy  [facility] for tasks assessed/performed       Past Medical History:  Diagnosis Date  . BACK PAIN   . CAD in native artery    a. moderate by cardiac CT 2016.  Marland Kitchen COLONIC POLYPS, HX OF   . DEGENERATIVE JOINT DISEASE, KNEES, BILATERAL   . Depression   . Diabetes mellitus    type II  . DIVERTICULOSIS, COLON   . DVT, lower extremity ([place])    right  . Hyperlipidemia   . Hypertension   . HYPOGONADISM, MALE   . HYPOTENSION, ORTHOSTATIC   . Kidney stones   . Long term current use of anticoagulant   . Obesity   . Obstructive sleep apnea    uses cpap setting 8 to 10  . Pulmonary emboli ([place])   . Renal insufficiency    a. prior h/o, does not appear chronic.    Past Surgical History:  Procedure Laterality Date  . ankle surgury  2001 bilateral   with bone spurs and torn tendon  . EDG  2008   chronic Duodenitis  . GASTRIC BYPASS    . HEEL SPUR SURGERY Bilateral 2005  . knee surgury  2000 & 2003    cartilage damage  . LUMBAR LAMINECTOMY/DECOMPRESSION MICRODISCECTOMY  09/28/2012   Procedure: LUMBAR LAMINECTOMY/DECOMPRESSION MICRODISCECTOMY 2 LEVELS;  Surgeon: [name], MD;  Location: [facility];  Service: Orthopedics;  Laterality: Left;  Decompressive laminectomy L4-L5. Microdiscectomy L5-S1  . REPLACEMENT TOTAL KNEE  2013  . TONSILLECTOMY AND ADENOIDECTOMY  age 50 or 20   . TOTAL KNEE ARTHROPLASTY  01/10/2012   Procedure: TOTAL KNEE ARTHROPLASTY;  Surgeon: [name], MD;  Location: [facility];  Service: Orthopedics;  Laterality: Left;    There were no vitals filed for this visit.  Subjective Assessment - 02/22/20 1104    Subjective  Pt reports he is doing the HEP 1-2 x a day, the isometrics are tender some times.    Patient Stated Goals  get painfree movement    Currently in Pain?  Yes    Pain Score  5     Pain Location  Arm    Pain Orientation  Left    Pain Descriptors / Indicators  Dull;Nagging    Pain Type  Acute pain                       [facility] Adult PT Treatment/Exercise - 02/22/20 0001      Shoulder Exercises: Supine   Horizontal ABduction  Strengthening;Both;10 reps;Theraband    Theraband Level (Shoulder Horizontal ABduction)  Level 2 (Red)    Horizontal ABduction Limitations  on bolster     External Rotation  Strengthening;Both;10 reps;Theraband    Theraband Level (Shoulder External Rotation)  Level 2 (Red)    External Rotation Limitations  on bolster    Flexion  Strengthening;Both;10 reps;Theraband    Theraband Level (Shoulder Flexion)  Level 2 (Red)    Flexion Limitations  on whole bolster, overhead stretch    Other Supine Exercises  SASH - red band, on bolster stopped after 3 reps on Lt d/t increased pain      Shoulder Exercises: Prone   Other Prone Exercises  LT UE T's off EOB 2x10 palm down and thumb up      Shoulder Exercises: Sidelying   External Rotation  Strengthening;Left;Weights    External Rotation Weight (lbs)  2    External Rotation Limitations  towel under elbow      Shoulder Exercises: ROM/Strengthening   UBE (Upper Arm Bike)  L2x4' alt FWD/BWD      Shoulder Exercises: Stretch   Other Shoulder Stretches  in hooklying, arms 90 degrees abduction, IR/ER flow, angel stretch and reach overhead      Modalities   Modalities  Iontophoresis      Iontophoresis   Type of Iontophoresis  Dexamethasone     Location  Lt AC joint    Dose  1.0cc    Time  74mAMp patch                  PT Long Term Goals - 02/12/20 1008      PT LONG TERM GOAL #1   Title  I with advanced HEP ( 03/25/2020)    Time  6    Period  Weeks    Status  New    Target Date  03/25/20      PT LONG TERM GOAL #2   Title  improve Lt shoulder strength =/> Rt without pain  ( 03/25/2020)    Time  6    Period  Weeks    Status  New    Target Date  03/25/20      PT LONG TERM GOAL #3   Title  improved FOTO =/< 33% limited ( 03/25/2020)    Time  6    Period  Weeks    Status  New    Target Date  03/25/20      PT LONG TERM GOAL #4   Title  report pain decrease in Lt shoulder to allow him to sleep per his previous level ( 03/25/2020)    Time  6    Period  Weeks    Status  New    Target Date  03/25/20            Plan - 02/22/20 1140    Clinical Impression Statement  This is pts first tx after the eval. Continues with tenderness at the [facility] joint,  ionto patch tried today to decrease pain and inflammation.  He fatigued with isolated ER work and with T's thumb up.  Would benefit from continued tx to sterngth scapular stability and RTC    Rehab Potential  Excellent    PT Frequency  1x / week    PT Duration  6 weeks    PT Treatment/Interventions  Iontophoresis 4mg /ml Dexamethasone;Taping;Vasopneumatic Device;Patient/family education;Cryotherapy;Electrical Stimulation;Manual techniques;Dry needling;Therapeutic exercise    PT Next Visit Plan  assess response to ionto and cont if did well, RTC and scap strength    Consulted and Agree with Plan of Care  Patient       Patient will benefit from skilled therapeutic intervention in order to improve the following deficits and impairments:  Pain, Impaired UE functional use, Decreased strength  Visit Diagnosis: Acute pain of left shoulder  Muscle weakness (generalized)     Problem List Patient Active Problem List   Diagnosis Date Noted  . Chronic bilateral  low back pain with bilateral sciatica 01/12/2019  . Headache 12/11/2018  . AKI (acute kidney injury) ([place]) 12/11/2018  . Nosebleed 12/11/2018  . Maxillary sinus cyst 12/11/2018  . Left leg pain 12/11/2018  . Left hip pain 12/01/2018  . Acute upper respiratory infection 08/17/2018  . Increased prostate specific antigen (PSA) velocity 07/12/2018  . Pain in lower jaw 03/23/2018  . Pain of left heel 12/21/2017  . Long term (current) use of anticoagulants 08/17/2017  . RBBB 07/28/2017  . CAD (coronary artery disease) 06/28/2017  . Degenerative arthritis of right knee 07/15/2016  . Acute bursitis of right shoulder 07/15/2016  . Right shoulder pain 06/24/2016  . Right knee pain 06/24/2016  . Superficial phlebitis of arm 03/18/2016  . Abdominal pain 12/23/2015  . Rash 10/29/2015  . Chest pain 03/27/2015  . Dysuria 03/27/2015  . Diabetes ([place]) 10/01/2014  . Nipple pain 03/13/2014  . Breast mass in male 03/13/2014  . Encounter for therapeutic drug monitoring 01/25/2014  . Peripheral edema 10/10/2013  . OSA (obstructive sleep apnea) 09/11/2013  . Lesion of penis 01/01/2013  . Lumbosacral radiculopathy at S1 06/14/2012  . S/P left knee replacement 01/10/2012  . Preventative health care 12/16/2011  . Back pain with radiation 09/18/2011  . Long term current use of anticoagulant 04/08/2011  . Pulmonary embolism ([place]) 02/05/2011  . HYPOTENSION, ORTHOSTATIC 01/29/2010  . CONSTIPATION 01/21/2010  . DEGENERATIVE JOINT DISEASE, KNEES, BILATERAL 11/17/2009  . SMOKER 09/12/2009  . HYPOGONADISM, MALE 03/04/2008  . OBESITY, MORBID 03/04/2008  . Depression 10/05/2007  . DIVERTICULOSIS, COLON 10/05/2007  . RENAL INSUFFICIENCY 10/05/2007  . COLONIC POLYPS, HX OF 10/05/2007  . Hyperlipidemia 06/30/2007  . Essential hypertension 06/30/2007    Boneta Lucks rPT  02/22/2020, 11:42 AM  [place], [place],  [address] Phone: [phone]   Fax:  [phone]  Name: [name] MRN: [id number] Date of Birth: 01-11-1956

## 2020-02-22 NOTE — Patient Instructions (Signed)
IONTOPHORESIS PATIENT PRECAUTIONS & CONTRAINDICATIONS:  . Redness under one or both electrodes can occur.  This characterized by a uniform redness that usually disappears within 12 hours of treatment. . Small pinhead size blisters may result in response to the drug.  Contact your physician if the problem persists more than 24 hours. . On rare occasions, iontophoresis therapy can result in temporary skin reactions such as rash, inflammation, irritation or burns.  The skin reactions may be the result of individual sensitivity to the ionic solution used, the condition of the skin at the start of treatment, reaction to the materials in the electrodes, allergies or sensitivity to dexamethasone, or a poor connection between the patch and your skin.  Discontinue using iontophoresis if you have any of these reactions and report to your therapist. . Remove the Patch or electrodes if you have any undue sensation of pain or burning during the treatment and report discomfort to your therapist. . Tell your Therapist if you have had known adverse reactions to the application of electrical current. Marland Kitchen   Approximate treatment time is 4-6 hours.  Remove the patch after 6 hours. . The Patch can be worn during normal activity, however excessive motion where the electrodes have been placed can cause poor contact between the skin and the electrode or uneven electrical current resulting in greater risk of skin irritation. Marland Kitchen Keep out of the reach of children.   . DO NOT use if you have a cardiac pacemaker or any other electrically sensitive implanted device. . DO NOT use if you have a known sensitivity to dexamethasone. . DO NOT use during Magnetic Resonance Imaging (MRI). . DO NOT use over broken or compromised skin (e.g. sunburn, cuts, or acne) due to the increased risk of skin reaction. . DO NOT SHAVE over the area to be treated:  To establish good contact between the Patch and the skin, excessive hair may be  clipped. . DO NOT place the Patch or electrodes on or over your eyes, directly over your heart, or brain. . DO NOT reuse the Patch or electrodes as this may cause burns to occur.

## 2020-02-25 ENCOUNTER — Other Ambulatory Visit: Payer: Self-pay | Admitting: Internal Medicine

## 2020-03-03 ENCOUNTER — Encounter: Payer: Self-pay | Admitting: Physical Therapy

## 2020-03-03 ENCOUNTER — Other Ambulatory Visit: Payer: Self-pay

## 2020-03-03 ENCOUNTER — Ambulatory Visit: Payer: PPO | Admitting: Physical Therapy

## 2020-03-03 DIAGNOSIS — M25512 Pain in left shoulder: Secondary | ICD-10-CM | POA: Diagnosis not present

## 2020-03-03 DIAGNOSIS — M6281 Muscle weakness (generalized): Secondary | ICD-10-CM

## 2020-03-03 NOTE — Therapy (Signed)
[place], [place], [address] Phone: [phone]   Fax:  [phone]  Physical Therapy Treatment  Patient Details  Name: [name] MRN: [id number] Date of Birth: 07/15/1956 Referring Provider (PT): Dr Lynne Leader   Encounter Date: 03/03/2020  PT End of Session - 03/03/20 1143    Visit Number  3    Date for PT Re-Evaluation  03/25/20    PT Start Time  1103    PT Stop Time  1144    PT Time Calculation (min)  41 min    Activity Tolerance  Patient tolerated treatment well    Behavior During Therapy  [facility] for tasks assessed/performed       Past Medical History:  Diagnosis Date  . BACK PAIN   . CAD in native artery    a. moderate by cardiac CT 2016.  Marland Kitchen COLONIC POLYPS, HX OF   . DEGENERATIVE JOINT DISEASE, KNEES, BILATERAL   . Depression   . Diabetes mellitus    type II  . DIVERTICULOSIS, COLON   . DVT, lower extremity ([place])    right  . Hyperlipidemia   . Hypertension   . HYPOGONADISM, MALE   . HYPOTENSION, ORTHOSTATIC   . Kidney stones   . Long term current use of anticoagulant   . Obesity   . Obstructive sleep apnea    uses cpap setting 8 to 10  . Pulmonary emboli ([place])   . Renal insufficiency    a. prior h/o, does not appear chronic.    Past Surgical History:  Procedure Laterality Date  . ankle surgury  2001 bilateral   with bone spurs and torn tendon  . EDG  2008   chronic Duodenitis  . GASTRIC BYPASS    . HEEL SPUR SURGERY Bilateral 2005  . knee surgury  2000 & 2003    cartilage damage  . LUMBAR LAMINECTOMY/DECOMPRESSION MICRODISCECTOMY  09/28/2012   Procedure: LUMBAR LAMINECTOMY/DECOMPRESSION MICRODISCECTOMY 2 LEVELS;  Surgeon: [name], MD;  Location: [facility];  Service: Orthopedics;  Laterality: Left;  Decompressive laminectomy L4-L5. Microdiscectomy L5-S1  . REPLACEMENT TOTAL KNEE  2013  . TONSILLECTOMY AND ADENOIDECTOMY  age 60 or 48  . TOTAL KNEE  ARTHROPLASTY  01/10/2012   Procedure: TOTAL KNEE ARTHROPLASTY;  Surgeon: [name], MD;  Location: [facility];  Service: Orthopedics;  Laterality: Left;    There were no vitals filed for this visit.  Subjective Assessment - 03/03/20 1106    Subjective  Doing ok overall, some tenderness.    Currently in Pain?  Yes    Pain Score  4     Pain Location  Shoulder    Pain Orientation  Left    Pain Descriptors / Indicators  Tender                       [facility] Adult PT Treatment/Exercise - 03/03/20 0001      Shoulder Exercises: Seated   Other Seated Exercises  OHP 4lb 2x10       Shoulder Exercises: Standing   Internal Rotation  Strengthening;Left;20 reps;Theraband    Theraband Level (Shoulder Internal Rotation)  Level 3 (Green)    Flexion  Weights;20 reps;Both;Strengthening    Shoulder Flexion Weight (lbs)  3    ABduction  Strengthening;Both;20 reps;Weights    Shoulder ABduction Weight (lbs)  3    Extension  Theraband;20 reps;Both    Theraband Level (Shoulder Extension)  Level 3 (Green)    Other Standing Exercises  Tricps ext 35lb 2x15      Shoulder Exercises: ROM/Strengthening   UBE (Upper Arm Bike)  L3x 6  alt FWD/BWD    Other ROM/Strengthening Exercises  Rows & Lats 25lb 2x10       Modalities   Modalities  Iontophoresis      Iontophoresis   Type of Iontophoresis  Dexamethasone    Location  Lt AC joint    Dose  1.0cc    Time  85mAMp patch                  PT Long Term Goals - 03/03/20 1112      PT LONG TERM GOAL #2   Title  improve Lt shoulder strength =/> Rt without pain  ( 03/25/2020)    Status  On-going            Plan - 03/03/20 1143    Clinical Impression Statement  Pt is progressing well with the interventions. Continues treatment with ionto patch at L AC point. He did well overall with the interventions. Some difficulty with L shoulder external rotation. Difficulty also present with seated OHP.    Personal Factors and Comorbidities   Comorbidity 3+    Examination-Activity Limitations  Other;Carry    Stability/Clinical Decision Making  Stable/Uncomplicated    Rehab Potential  Excellent    PT Frequency  1x / week    PT Duration  6 weeks    PT Treatment/Interventions  Iontophoresis 4mg /ml Dexamethasone;Taping;Vasopneumatic Device;Patient/family education;Cryotherapy;Electrical Stimulation;Manual techniques;Dry needling;Therapeutic exercise    PT Next Visit Plan  assess response to ionto and cont if did well, RTC and scap strength       Patient will benefit from skilled therapeutic intervention in order to improve the following deficits and impairments:  Pain, Impaired UE functional use, Decreased strength  Visit Diagnosis: Muscle weakness (generalized)  Acute pain of left shoulder     Problem List Patient Active Problem List   Diagnosis Date Noted  . Chronic bilateral low back pain with bilateral sciatica 01/12/2019  . Headache 12/11/2018  . AKI (acute kidney injury) ([place]) 12/11/2018  . Nosebleed 12/11/2018  . Maxillary sinus cyst 12/11/2018  . Left leg pain 12/11/2018  . Left hip pain 12/01/2018  . Acute upper respiratory infection 08/17/2018  . Increased prostate specific antigen (PSA) velocity 07/12/2018  . Pain in lower jaw 03/23/2018  . Pain of left heel 12/21/2017  . Long term (current) use of anticoagulants 08/17/2017  . RBBB 07/28/2017  . CAD (coronary artery disease) 06/28/2017  . Degenerative arthritis of right knee 07/15/2016  . Acute bursitis of right shoulder 07/15/2016  . Right shoulder pain 06/24/2016  . Right knee pain 06/24/2016  . Superficial phlebitis of arm 03/18/2016  . Abdominal pain 12/23/2015  . Rash 10/29/2015  . Chest pain 03/27/2015  . Dysuria 03/27/2015  . Diabetes ([place]) 10/01/2014  . Nipple pain 03/13/2014  . Breast mass in male 03/13/2014  . Encounter for therapeutic drug monitoring 01/25/2014  . Peripheral edema 10/10/2013  . OSA (obstructive sleep apnea) 09/11/2013   . Lesion of penis 01/01/2013  . Lumbosacral radiculopathy at S1 06/14/2012  . S/P left knee replacement 01/10/2012  . Preventative health care 12/16/2011  . Back pain with radiation 09/18/2011  . Long term current use of anticoagulant 04/08/2011  . Pulmonary embolism ([place]) 02/05/2011  . HYPOTENSION, ORTHOSTATIC 01/29/2010  . CONSTIPATION 01/21/2010  . DEGENERATIVE JOINT DISEASE, KNEES, BILATERAL 11/17/2009  .  SMOKER 09/12/2009  . HYPOGONADISM, MALE 03/04/2008  . OBESITY, MORBID 03/04/2008  . Depression 10/05/2007  . DIVERTICULOSIS, COLON 10/05/2007  . RENAL INSUFFICIENCY 10/05/2007  . COLONIC POLYPS, HX OF 10/05/2007  . Hyperlipidemia 06/30/2007  . Essential hypertension 06/30/2007    Scot Jun, PTA 03/03/2020, 11:46 AM  [place], [place], [address] Phone: [phone]   Fax:  [phone]  Name: [name] MRN: [id number] Date of Birth: 06-27-1956

## 2020-03-11 ENCOUNTER — Ambulatory Visit (INDEPENDENT_AMBULATORY_CARE_PROVIDER_SITE_OTHER): Payer: PPO | Admitting: General Practice

## 2020-03-11 ENCOUNTER — Other Ambulatory Visit: Payer: Self-pay

## 2020-03-11 DIAGNOSIS — Z7901 Long term (current) use of anticoagulants: Secondary | ICD-10-CM | POA: Diagnosis not present

## 2020-03-11 LAB — POCT INR: INR: 2.7 (ref 2.0–3.0)

## 2020-03-11 NOTE — Progress Notes (Signed)
Medical screening examination/treatment/procedure(s) were performed by non-physician practitioner and as supervising physician I was immediately available for consultation/collaboration. I agree with above. James John, MD   

## 2020-03-11 NOTE — Patient Instructions (Signed)
Pre visit review using our clinic review tool, if applicable. No additional management support is needed unless otherwise documented below in the visit note.  Continue to take 1 tablet daily.  Re-check in 6 weeks. Reduce greens intake.

## 2020-03-13 ENCOUNTER — Ambulatory Visit: Payer: PPO

## 2020-03-14 ENCOUNTER — Ambulatory Visit: Payer: PPO | Admitting: Physical Therapy

## 2020-03-14 ENCOUNTER — Other Ambulatory Visit: Payer: Self-pay

## 2020-03-17 ENCOUNTER — Ambulatory Visit: Payer: PPO | Admitting: Family Medicine

## 2020-03-17 ENCOUNTER — Other Ambulatory Visit: Payer: Self-pay

## 2020-03-17 ENCOUNTER — Ambulatory Visit (INDEPENDENT_AMBULATORY_CARE_PROVIDER_SITE_OTHER): Payer: PPO

## 2020-03-17 ENCOUNTER — Ambulatory Visit: Payer: Self-pay

## 2020-03-17 ENCOUNTER — Encounter: Payer: Self-pay | Admitting: Family Medicine

## 2020-03-17 VITALS — BP 126/84 | HR 78 | Ht 74.0 in | Wt 284.6 lb

## 2020-03-17 DIAGNOSIS — M19012 Primary osteoarthritis, left shoulder: Secondary | ICD-10-CM | POA: Diagnosis not present

## 2020-03-17 DIAGNOSIS — M25512 Pain in left shoulder: Secondary | ICD-10-CM

## 2020-03-17 NOTE — Progress Notes (Signed)
I, [name], LAT, ATC, am serving as scribe for Dr. Lynne [name].  [name] is a 64 y.o. male who presents to [place] at [facility] today for f/u of L shoulder pain.  He last saw Dr. Georgina [name] on 02/18/20 and noted ~50% improvement at that time.  He has completed 2 PT sessions.  Since his last visit, pt reports that his L shoulder pain has not changed since his last visit.  He reports having fallen backwards while in a chair and landed on his L elbow which irritated his L shoulder.  Pt states that he is interested in getting an injection.  Pain located anterior shoulder pain is worse with shoulder motion.  He denies much pain in the lateral upper arm.   Pertinent review of systems: No fevers or chills  Relevant historical information: Sleep apnea, history of pulmonary embolism.  Diabetes   Exam:  BP 126/84 (BP Location: Right Arm, Patient Position: Sitting, Cuff Size: Large)   Pulse 78   Ht 6\' 2"  (1.88 m)   Wt 284 lb 9.6 oz (129.1 kg)   SpO2 96%   BMI 36.54 kg/m  General: Well Developed, well nourished, and in no acute distress.   MSK:  Left shoulder: Normal-appearing Nontender anterior shoulder bicipital groove or AC joint. Shoulder motion intact abduction and internal rotation for pain with abduction. Strength intact abduction external rotation.  Mild pain with resisted abduction and external rotation. Positive empty can test. Mildly positive Hawkins and Neer's test. Negative Yergason's and speeds test. Pulses capillary fill and sensation are intact distally.    Lab and Radiology Results  X-ray images left shoulder obtained today personally and independently reviewed Mild DJD glenohumeral joint.  Moderate degenerative AC joint.  Bone cyst or osteopenia appearance of humeral head.  No acute fractures. Await formal radiology review  Diagnostic Limited MSK Ultrasound of: Left shoulder Biceps tendon intact normal. Subscapularis tendon  intact. Supraspinatus tendon intact with increased subacromial bursa thickness. Infraspinatus tendon intact. AC joint degenerative with mild effusion. Impression: Subacromial bursitis with AC DJD   Procedure: Real-time Ultrasound Guided Injection of left shoulder glenohumeral joint Device: Philips Affiniti 50G Images permanently stored and available for review in the ultrasound unit. Verbal informed consent obtained.  Discussed risks and benefits of procedure. Warned about infection bleeding damage to structures skin hypopigmentation and fat atrophy among others. Patient expresses understanding and agreement Time-out conducted.   Noted no overlying erythema, induration, or other signs of local infection.   Skin prepped in a sterile fashion.   Local anesthesia: Topical Ethyl chloride.   With sterile technique and under real time ultrasound guidance:  40 mg of Kenalog and 2 mL of Marcaine injected easily.   Completed without difficulty   Pain immediately resolved suggesting accurate placement of the medication.   Advised to call if fevers/chills, erythema, induration, drainage, or persistent bleeding.   Images permanently stored and available for review in the ultrasound unit.  Impression: Technically successful ultrasound guided injection.       Assessment and Plan: 64 y.o. male with left shoulder pain.  Failing typical conservative management.  Plan for glenohumeral injection today.  Patient had significant benefit in pain following injection.  Continue home exercise program and physical therapy and recheck back in about a month.  Return sooner if needed.  If all better no need for follow-up.   PDMP not reviewed this encounter. Orders Placed This Encounter  Procedures  . [place] LIMITED JOINT SPACE STRUCTURES  UP LEFT    Standing Status:   Future    Number of Occurrences:   1    Standing Expiration Date:   05/17/2021    Order Specific Question:   Reason for Exam (SYMPTOM  OR DIAGNOSIS  REQUIRED)    Answer:   Shoulder pain    Order Specific Question:   Preferred imaging location?    Answer:   [place]  . DG Shoulder Left    Standing Status:   Future    Number of Occurrences:   1    Standing Expiration Date:   05/17/2021    Order Specific Question:   Reason for Exam (SYMPTOM  OR DIAGNOSIS REQUIRED)    Answer:   eval left shoulder pain    Order Specific Question:   Preferred imaging location?    Answer:   [facility]    Order Specific Question:   Radiology Contrast Protocol - do NOT remove file path    Answer:   [email or web address]   No orders of the defined types were placed in this encounter.    Discussed warning signs or symptoms. Please see discharge instructions. Patient expresses understanding.   The above documentation has been reviewed and is accurate and complete [name]

## 2020-03-17 NOTE — Patient Instructions (Addendum)
You had a L shoulder injection today. Call or go to the ER if you develop a large red swollen joint with extreme pain or oozing puss.   Get xray today on your way out.   Continue limited PT and home exercises.   Recheck in 1 month or sooner if needed.

## 2020-03-18 NOTE — Progress Notes (Signed)
X-ray shoulder shows mild arthritis

## 2020-03-21 ENCOUNTER — Ambulatory Visit: Payer: PPO | Attending: Family Medicine | Admitting: Physical Therapy

## 2020-03-21 ENCOUNTER — Other Ambulatory Visit: Payer: Self-pay

## 2020-03-21 DIAGNOSIS — M6281 Muscle weakness (generalized): Secondary | ICD-10-CM | POA: Diagnosis not present

## 2020-03-21 DIAGNOSIS — M25512 Pain in left shoulder: Secondary | ICD-10-CM | POA: Insufficient documentation

## 2020-03-21 NOTE — Therapy (Signed)
[name], [place], [address] Phone: [phone]   Fax:  [phone]  Physical Therapy Treatment  Patient Details  Name: [name] MRN: [id number] Date of Birth: [birth year] Referring Provider (PT): Dr Lynne Leader   Encounter Date: 03/21/2020  PT End of Session - 03/21/20 1145    Visit Number  4    Number of Visits  6    Date for PT Re-Evaluation  03/25/20    PT Start Time  1105    PT Stop Time  1145    PT Time Calculation (min)  40 min    Activity Tolerance  Patient tolerated treatment well    Behavior During Therapy  [facility] for tasks assessed/performed       Past Medical History:  Diagnosis Date  . BACK PAIN   . CAD in native artery    a. moderate by cardiac CT 2016.  Marland Kitchen COLONIC POLYPS, HX OF   . DEGENERATIVE JOINT DISEASE, KNEES, BILATERAL   . Depression   . Diabetes mellitus    type II  . DIVERTICULOSIS, COLON   . DVT, lower extremity ([place])    right  . Hyperlipidemia   . Hypertension   . HYPOGONADISM, MALE   . HYPOTENSION, ORTHOSTATIC   . Kidney stones   . Long term current use of anticoagulant   . Obesity   . Obstructive sleep apnea    uses cpap setting 8 to 10  . Pulmonary emboli ([place])   . Renal insufficiency    a. prior h/o, does not appear chronic.    Past Surgical History:  Procedure Laterality Date  . ankle surgury  2001 bilateral   with bone spurs and torn tendon  . EDG  2008   chronic Duodenitis  . GASTRIC BYPASS    . HEEL SPUR SURGERY Bilateral 2005  . knee surgury  2000 & 2003    cartilage damage  . LUMBAR LAMINECTOMY/DECOMPRESSION MICRODISCECTOMY  09/28/2012   Procedure: LUMBAR LAMINECTOMY/DECOMPRESSION MICRODISCECTOMY 2 LEVELS;  Surgeon: [name], MD;  Location: [facility];  Service: Orthopedics;  Laterality: Left;  Decompressive laminectomy L4-L5. Microdiscectomy L5-S1  . REPLACEMENT TOTAL KNEE  2013  . TONSILLECTOMY AND ADENOIDECTOMY  age 58 or 68   . TOTAL KNEE ARTHROPLASTY  01/10/2012   Procedure: TOTAL KNEE ARTHROPLASTY;  Surgeon: [name], MD;  Location: [facility];  Service: Orthopedics;  Laterality: Left;    There were no vitals filed for this visit.  Subjective Assessment - 03/21/20 1104    Subjective  Doing ok, shoulder is feeling pretty decent    Currently in Pain?  Yes    Pain Score  2     Pain Location  Shoulder    Pain Orientation  Left                       [facility] Adult PT Treatment/Exercise - 03/21/20 0001      Shoulder Exercises: Standing   External Rotation  Strengthening;Left;20 reps;Theraband    Theraband Level (Shoulder External Rotation)  Level 3 (Green)    Internal Rotation  Strengthening;Left;20 reps;Theraband    Theraband Level (Shoulder Internal Rotation)  Level 3 (Green)    Flexion  Strengthening;Both;20 reps;Weights    Shoulder Flexion Weight (lbs)  3    ABduction  Strengthening;Both;20 reps;Weights    Shoulder ABduction Weight (lbs)  3    Extension  Theraband;20 reps;Both    Theraband Level (  Shoulder Extension)  Level 3 (Green)    Other Standing Exercises  triceps ext 35# 2x10    Other Standing Exercises  bicep curl 2x15      Shoulder Exercises: Therapy Ball   Flexion  Both;15 reps   touching the wall with ball      Shoulder Exercises: ROM/Strengthening   UBE (Upper Arm Bike)  L3x 6  alt FWD/BWD    Lat Pull  2.5 plate;20 reps   2 sets 10 reps   Cybex Row  2.5 plate;20 reps   2 sets 10 reps                 PT Long Term Goals - 03/21/20 1151      PT LONG TERM GOAL #1   Status  Achieved      PT LONG TERM GOAL #2   Title  improve Lt shoulder strength =/> Rt without pain  ( 03/25/2020)    Status  Achieved            Plan - 03/21/20 1147    Clinical Impression Statement  Pt maintains good overall ROM and  little pain with the scap stab exercises, he had a cotizone shot in his L shld last Friday, no complaints during treatement, pt showed no difficulty  with L shld ext rotation, he reports difficulty with this last tx session. Marland Kitchen    PT Frequency  1x / week    PT Duration  6 weeks    PT Treatment/Interventions  Iontophoresis 4mg /ml Dexamethasone;Taping;Vasopneumatic Device;Patient/family education;Cryotherapy;Electrical Stimulation;Manual techniques;Dry needling;Therapeutic exercise    PT Next Visit Plan  assess response to ionto and cont if did well, RTC and scap strength       Patient will benefit from skilled therapeutic intervention in order to improve the following deficits and impairments:     Visit Diagnosis: Muscle weakness (generalized)  Acute pain of left shoulder     Problem List Patient Active Problem List   Diagnosis Date Noted  . Chronic bilateral low back pain with bilateral sciatica 01/12/2019  . Headache 12/11/2018  . AKI (acute kidney injury) ([place]) 12/11/2018  . Nosebleed 12/11/2018  . Maxillary sinus cyst 12/11/2018  . Left leg pain 12/11/2018  . Left hip pain 12/01/2018  . Acute upper respiratory infection 08/17/2018  . Increased prostate specific antigen (PSA) velocity 07/12/2018  . Pain in lower jaw 03/23/2018  . Pain of left heel 12/21/2017  . Long term (current) use of anticoagulants 08/17/2017  . RBBB 07/28/2017  . CAD (coronary artery disease) 06/28/2017  . Degenerative arthritis of right knee 07/15/2016  . Acute bursitis of right shoulder 07/15/2016  . Right shoulder pain 06/24/2016  . Right knee pain 06/24/2016  . Superficial phlebitis of arm 03/18/2016  . Abdominal pain 12/23/2015  . Rash 10/29/2015  . Chest pain 03/27/2015  . Dysuria 03/27/2015  . Diabetes ([place]) 10/01/2014  . Nipple pain 03/13/2014  . Breast mass in male 03/13/2014  . Encounter for therapeutic drug monitoring 01/25/2014  . Peripheral edema 10/10/2013  . OSA (obstructive sleep apnea) 09/11/2013  . Lesion of penis 01/01/2013  . Lumbosacral radiculopathy at S1 06/14/2012  . S/P left knee replacement 01/10/2012  .  Preventative health care 12/16/2011  . Back pain with radiation 09/18/2011  . Long term current use of anticoagulant 04/08/2011  . Pulmonary embolism ([place]) 02/05/2011  . HYPOTENSION, ORTHOSTATIC 01/29/2010  . CONSTIPATION 01/21/2010  . DEGENERATIVE JOINT DISEASE, KNEES, BILATERAL 11/17/2009  . SMOKER 09/12/2009  . HYPOGONADISM,  MALE 03/04/2008  . OBESITY, MORBID 03/04/2008  . Depression 10/05/2007  . DIVERTICULOSIS, COLON 10/05/2007  . RENAL INSUFFICIENCY 10/05/2007  . COLONIC POLYPS, HX OF 10/05/2007  . Hyperlipidemia 06/30/2007  . Essential hypertension 06/30/2007    Clarene Essex, SPTA 03/21/2020, 11:54 AM  [place], [place], [address] Phone: [phone]   Fax:  [phone]  Name: ERASMO DIMARZIO MRN: [id number] Date of Birth: [birth year]

## 2020-03-24 ENCOUNTER — Other Ambulatory Visit: Payer: Self-pay | Admitting: Internal Medicine

## 2020-03-24 DIAGNOSIS — Z0000 Encounter for general adult medical examination without abnormal findings: Secondary | ICD-10-CM

## 2020-03-25 ENCOUNTER — Encounter: Payer: Self-pay | Admitting: Internal Medicine

## 2020-03-28 ENCOUNTER — Encounter: Payer: Self-pay | Admitting: Physical Therapy

## 2020-03-28 ENCOUNTER — Ambulatory Visit: Payer: PPO | Admitting: Physical Therapy

## 2020-03-28 ENCOUNTER — Other Ambulatory Visit: Payer: Self-pay

## 2020-03-28 DIAGNOSIS — M6281 Muscle weakness (generalized): Secondary | ICD-10-CM | POA: Diagnosis not present

## 2020-03-28 DIAGNOSIS — M25512 Pain in left shoulder: Secondary | ICD-10-CM

## 2020-03-28 NOTE — Therapy (Addendum)
[name], [place], [address] Phone: [phone]   Fax:  [phone]  Physical Therapy Treatment  Patient Details  Name: [name] MRN: [id number] Date of Birth: 04/04/1956 Referring Provider (PT): Dr Lynne Leader   Encounter Date: 03/28/2020  PT End of Session - 03/28/20 1116    Visit Number  5    Date for PT Re-Evaluation  03/25/20    PT Start Time  1100    PT Stop Time  1115    PT Time Calculation (min)  15 min    Activity Tolerance  Patient tolerated treatment well    Behavior During Therapy  [facility] for tasks assessed/performed       Past Medical History:  Diagnosis Date  . BACK PAIN   . CAD in native artery    a. moderate by cardiac CT 2016.  Marland Kitchen COLONIC POLYPS, HX OF   . DEGENERATIVE JOINT DISEASE, KNEES, BILATERAL   . Depression   . Diabetes mellitus    type II  . DIVERTICULOSIS, COLON   . DVT, lower extremity ([place])    right  . Hyperlipidemia   . Hypertension   . HYPOGONADISM, MALE   . HYPOTENSION, ORTHOSTATIC   . Kidney stones   . Long term current use of anticoagulant   . Obesity   . Obstructive sleep apnea    uses cpap setting 8 to 10  . Pulmonary emboli ([place])   . Renal insufficiency    a. prior h/o, does not appear chronic.    Past Surgical History:  Procedure Laterality Date  . ankle surgury  2001 bilateral   with bone spurs and torn tendon  . EDG  2008   chronic Duodenitis  . GASTRIC BYPASS    . HEEL SPUR SURGERY Bilateral 2005  . knee surgury  2000 & 2003    cartilage damage  . LUMBAR LAMINECTOMY/DECOMPRESSION MICRODISCECTOMY  09/28/2012   Procedure: LUMBAR LAMINECTOMY/DECOMPRESSION MICRODISCECTOMY 2 LEVELS;  Surgeon: [name], MD;  Location: [facility];  Service: Orthopedics;  Laterality: Left;  Decompressive laminectomy L4-L5. Microdiscectomy L5-S1  . REPLACEMENT TOTAL KNEE  2013  . TONSILLECTOMY AND ADENOIDECTOMY  age 15 or 30  . TOTAL KNEE  ARTHROPLASTY  01/10/2012   Procedure: TOTAL KNEE ARTHROPLASTY;  Surgeon: [name], MD;  Location: [facility];  Service: Orthopedics;  Laterality: Left;    There were no vitals filed for this visit.  Subjective Assessment - 03/28/20 1103    Subjective  "Been doing great"    Currently in Pain?  No/denies         [facility] PT Assessment - 03/28/20 0001      Strength   Overall Strength  Within functional limits for tasks performed                   [facility] Adult PT Treatment/Exercise - 03/28/20 0001      Shoulder Exercises: ROM/Strengthening   UBE (Upper Arm Bike)  L5x 6  alt FWD/BWD    Lat Pull  2.5 plate;20 reps    Cybex Row  2.5 plate;20 reps                  PT Long Term Goals - 03/28/20 1116      PT LONG TERM GOAL #1   Title  I with advanced HEP ( 03/25/2020)    Status  Achieved      PT LONG TERM GOAL #2  Title  improve Lt shoulder strength =/> Rt without pain  ( 03/25/2020)    Status  Achieved      PT LONG TERM GOAL #3   Title  improved FOTO =/< 33% limited ( 03/25/2020)    Status  Achieved      PT LONG TERM GOAL #4   Title  report pain decrease in Lt shoulder to allow him to sleep per his previous level ( 03/25/2020)    Status  Achieved            Plan - 03/28/20 1116    Clinical Impression Statement  Pt has progressed completing all goals. He reports that he feels good about doing his HEP at home and is pleased with his current functional status.    Personal Factors and Comorbidities  Comorbidity 3+    Examination-Activity Limitations  Other;Carry    Examination-Participation Restrictions  Other    Stability/Clinical Decision Making  Stable/Uncomplicated    Rehab Potential  Excellent    PT Frequency  1x / week    PT Duration  6 weeks    PT Treatment/Interventions  Iontophoresis 79m/ml Dexamethasone;Taping;Vasopneumatic Device;Patient/family education;Cryotherapy;Electrical Stimulation;Manual techniques;Dry needling;Therapeutic exercise     PT Next Visit Plan  D/C PT       Patient will benefit from skilled therapeutic intervention in order to improve the following deficits and impairments:  Pain, Impaired UE functional use, Decreased strength  Visit Diagnosis: Muscle weakness (generalized)  Acute pain of left shoulder     Problem List Patient Active Problem List   Diagnosis Date Noted  . Chronic bilateral low back pain with bilateral sciatica 01/12/2019  . Headache 12/11/2018  . [place] ([place]) [place] 12/11/2018  . Nosebleed 12/11/2018  . Maxillary sinus cyst 12/11/2018  . Left leg pain 12/11/2018  . Left hip pain 12/01/2018  . Acute upper respiratory infection 08/17/2018  . Increased prostate specific antigen (PSA) velocity 07/12/2018  . Pain in lower jaw 03/23/2018  . Pain of left heel 12/21/2017  . Long term (current) use of anticoagulants 08/17/2017  . RBBB 07/28/2017  . CAD (coronary artery disease) 06/28/2017  . Degenerative arthritis of right knee 07/15/2016  . Acute bursitis of right shoulder 07/15/2016  . Right shoulder pain 06/24/2016  . Right knee pain 06/24/2016  . Superficial phlebitis of arm 03/18/2016  . Abdominal pain 12/23/2015  . Rash 10/29/2015  . Chest pain 03/27/2015  . Dysuria 03/27/2015  . Diabetes ([place] 10/01/2014  . Nipple pain 03/13/2014  . Breast mass in male 03/13/2014  . Encounter for therapeutic drug monitoring 01/25/2014  . Peripheral edema 10/10/2013  . OSA (obstructive sleep apnea) 09/11/2013  . Lesion of penis 01/01/2013  . Lumbosacral radiculopathy at S1 06/14/2012  . S/P left knee replacement 01/10/2012  . Preventative health care 12/16/2011  . Back pain with radiation 09/18/2011  . Long term current use of anticoagulant 04/08/2011  . [place] 02/05/2011  . HYPOTENSION, ORTHOSTATIC 01/29/2010  . CONSTIPATION 01/21/2010  . DEGENERATIVE JOINT DISEASE, KNEES, BILATERAL 11/17/2009  . SMOKER 09/12/2009  . HYPOGONADISM, MALE 03/04/2008  .  OBESITY, MORBID 03/04/2008  . Depression 10/05/2007  . DIVERTICULOSIS, COLON 10/05/2007  . RENAL INSUFFICIENCY 10/05/2007  . COLONIC POLYPS, HX OF 10/05/2007  . Hyperlipidemia 06/30/2007  . Essential hypertension 06/30/2007   PHYSICAL THERAPY DISCHARGE SUMMARY  Visits from Start of Care: 5 Plan: Patient agrees to discharge.  Patient goals were met. Patient is being discharged due to meeting the stated rehab goals.  ?????  Scot Jun, PTA 03/28/2020, 11:18 AM  [place], [place], [address] Phone: [phone]   Fax:  [phone]  Name: [name] MRN: [id number] Date of Birth: [birth year]

## 2020-04-04 ENCOUNTER — Encounter: Payer: PPO | Admitting: Physical Therapy
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
## 2020-04-09 ENCOUNTER — Other Ambulatory Visit: Payer: Self-pay

## 2020-04-09 ENCOUNTER — Ambulatory Visit: Payer: PPO | Admitting: Physician Assistant

## 2020-04-09 ENCOUNTER — Encounter: Payer: Self-pay | Admitting: Physician Assistant

## 2020-04-09 VITALS — BP 120/72 | HR 67 | Ht 74.0 in | Wt 283.1 lb

## 2020-04-09 DIAGNOSIS — I4510 Unspecified right bundle-branch block: Secondary | ICD-10-CM | POA: Diagnosis not present

## 2020-04-09 DIAGNOSIS — E785 Hyperlipidemia, unspecified: Secondary | ICD-10-CM

## 2020-04-09 DIAGNOSIS — E6609 Other obesity due to excess calories: Secondary | ICD-10-CM

## 2020-04-09 DIAGNOSIS — I2510 Atherosclerotic heart disease of native coronary artery without angina pectoris: Secondary | ICD-10-CM

## 2020-04-09 DIAGNOSIS — I10 Essential (primary) hypertension: Secondary | ICD-10-CM | POA: Diagnosis not present

## 2020-04-09 DIAGNOSIS — E119 Type 2 diabetes mellitus without complications: Secondary | ICD-10-CM

## 2020-04-09 NOTE — Patient Instructions (Signed)
Medication Instructions:  Your physician recommends that you continue on your current medications as directed. Please refer to the Current Medication list given to you today.  *If you need a refill on your cardiac medications before your next appointment, please call your pharmacy*   Lab Work: None ordered  If you have labs (blood work) drawn today and your tests are completely normal, you will receive your results only by: Marland Kitchen MyChart Message (if you have MyChart) OR . A paper copy in the mail If you have any lab test that is abnormal or we need to change your treatment, we will call you to review the results.   Testing/Procedures: None ordered   Follow-Up: At [facility], you and your health needs are our priority.  As part of our continuing mission to provide you with exceptional heart care, we have created designated Provider Care Teams.  These Care Teams include your primary Cardiologist (physician) and Advanced Practice Providers (APPs -  Physician Assistants and Nurse Practitioners) who all work together to provide you with the care you need, when you need it.  We recommend signing up for the patient portal called "MyChart".  Sign up information is provided on this After Visit Summary.  MyChart is used to connect with patients for Virtual Visits (Telemedicine).  Patients are able to view lab/test results, encounter notes, upcoming appointments, etc.  Non-urgent messages can be sent to your provider as well.   To learn more about what you can do with MyChart, go to [email or web address].    Your next appointment:   12 month(s)  The format for your next appointment:   In Person  Provider:   You may see Ena Dawley, MD or one of the following Advanced Practice Providers on your designated Care Team:    Melina Copa, PA-C  [name], PA-C    Other Instructions Your provider recommends that you maintain 150 minutes per week of moderate aerobic activity.

## 2020-04-15 ENCOUNTER — Ambulatory Visit: Payer: PPO | Admitting: Neurology

## 2020-04-15 ENCOUNTER — Other Ambulatory Visit: Payer: Self-pay

## 2020-04-15 ENCOUNTER — Encounter: Payer: Self-pay | Admitting: Neurology

## 2020-04-15 VITALS — BP 119/69 | HR 61 | Temp 97.5°F | Ht 74.0 in | Wt 283.0 lb

## 2020-04-15 DIAGNOSIS — M549 Dorsalgia, unspecified: Secondary | ICD-10-CM | POA: Diagnosis not present

## 2020-04-15 DIAGNOSIS — R519 Headache, unspecified: Secondary | ICD-10-CM | POA: Diagnosis not present

## 2020-04-15 MED ORDER — PREGABALIN 100 MG PO CAPS: 100.0000 mg | ORAL_CAPSULE | Freq: Two times a day (BID) | ORAL | 1 refills | Status: DC
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
## 2020-04-15 NOTE — Progress Notes (Signed)
I have read the note, and I agree with the clinical assessment and plan.  Briseida Gittings K Chantrice Hagg   

## 2020-04-15 NOTE — Patient Instructions (Signed)
Decrease Lyrica to 100 mg twice daily  Call if you need dose adjustment  See you back in 8 months

## 2020-04-16 ENCOUNTER — Ambulatory Visit: Payer: PPO | Admitting: Family Medicine

## 2020-04-22 ENCOUNTER — Ambulatory Visit: Payer: PPO

## 2020-04-29 ENCOUNTER — Ambulatory Visit (INDEPENDENT_AMBULATORY_CARE_PROVIDER_SITE_OTHER): Payer: PPO | Admitting: General Practice

## 2020-04-29 ENCOUNTER — Other Ambulatory Visit: Payer: Self-pay

## 2020-04-29 DIAGNOSIS — Z7901 Long term (current) use of anticoagulants: Secondary | ICD-10-CM | POA: Diagnosis not present

## 2020-04-29 LAB — POCT INR: INR: 1.9 — AB (ref 2.0–3.0)

## 2020-04-29 NOTE — Patient Instructions (Addendum)
Pre visit review using our clinic review tool, if applicable. No additional management support is needed unless otherwise documented below in the visit note.  Take 1 1/2 tablets today and then continue to take 1 tablet daily.  Re-check in 4 weeks.  Reduce greens intake.

## 2020-05-07 ENCOUNTER — Other Ambulatory Visit: Payer: Self-pay

## 2020-05-07 ENCOUNTER — Encounter (INDEPENDENT_AMBULATORY_CARE_PROVIDER_SITE_OTHER): Payer: PPO | Admitting: Ophthalmology

## 2020-05-07 DIAGNOSIS — H33301 Unspecified retinal break, right eye: Secondary | ICD-10-CM

## 2020-05-07 DIAGNOSIS — H43813 Vitreous degeneration, bilateral: Secondary | ICD-10-CM | POA: Diagnosis not present

## 2020-05-07 DIAGNOSIS — I10 Essential (primary) hypertension: Secondary | ICD-10-CM | POA: Diagnosis not present

## 2020-05-07 DIAGNOSIS — H2513 Age-related nuclear cataract, bilateral: Secondary | ICD-10-CM

## 2020-05-07 DIAGNOSIS — H35033 Hypertensive retinopathy, bilateral: Secondary | ICD-10-CM

## 2020-05-13 ENCOUNTER — Ambulatory Visit: Payer: PPO | Admitting: Internal Medicine

## 2020-05-13 ENCOUNTER — Telehealth: Payer: Self-pay | Admitting: *Deleted

## 2020-05-13 ENCOUNTER — Encounter: Payer: Self-pay | Admitting: Internal Medicine

## 2020-05-13 VITALS — BP 120/78 | HR 58 | Ht 74.0 in | Wt 278.0 lb

## 2020-05-13 DIAGNOSIS — Z1211 Encounter for screening for malignant neoplasm of colon: Secondary | ICD-10-CM

## 2020-05-13 DIAGNOSIS — I2699 Other pulmonary embolism without acute cor pulmonale: Secondary | ICD-10-CM | POA: Diagnosis not present

## 2020-05-13 DIAGNOSIS — Z7901 Long term (current) use of anticoagulants: Secondary | ICD-10-CM | POA: Diagnosis not present

## 2020-05-13 NOTE — Progress Notes (Signed)
[name] 64 y.o. 1956/07/21 [id number]  Assessment & Plan:   Encounter Diagnoses  Name Primary?  . Colon cancer screening Yes  . Warfarin anticoagulation   . Pulmonary embolism on long-term anticoagulation therapy [facility])    He is an appropriate candidate for a screening colonoscopy, I recommend holding his warfarin 5 days prior.  We will coordinate with Dr. Jenny Reichmann who prescribes his warfarin to make sure this is acceptable.  The risks and benefits as well as alternatives of endoscopic procedure(s) have been discussed and reviewed. All questions answered. The patient agrees to proceed.  The patient has been apprised of the rare but real risk of recurrent thrombosis of the veins and possible pulmonary embolism during the brief period of time he is off his warfarin.  CC: Biagio Borg, MD   Subjective:   Chief Complaint: Colon cancer screening in a patient on warfarin  HPI  The patient is a 64 year old African-American man who had a colonoscopy in 2011 by Dr. Sharlett Iles prior to his retirement, which was negative for colorectal neoplasia.  He presents today for consideration of repeat colonoscopy, he takes warfarin because of a history of DVT/pulmonary embolus.  He has held warfarin previously for a brief period of times and has never required Lovenox bridging that he is aware of.  His past medical history is also significant for a laparoscopic gastric bypass surgery by Dr. Toney Rakes of [facility] with a very successful weight loss.  No problems with that at this time and no active GI symptoms.  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 Allergies  Allergen Reactions  . Adhesive [Tape] Other (See Comments)    Painful, Please use "paper" tape  . Other     NO BLOOD -JEHOVAH'S WITNESS-SIGNED REFUSAL BUT WOULD TAKE ALBUMIN   Current Meds  Medication Sig  . Artificial Tear Ointment (ARTIFICIAL TEARS) ointment Place 1 drop into both eyes as needed (for dry eyes).  Marland Kitchen  aspirin EC 81 MG tablet Take 81 mg by mouth daily.  Marland Kitchen atorvastatin (LIPITOR) 80 MG tablet TAKE 1 TABLET BY MOUTH EVERY DAY  . Calcium Carbonate (CALCI-CHEW PO) Take 1 tablet by mouth daily.   . chlorthalidone (HYGROTON) 25 MG tablet Take 1 tablet (25 mg total) by mouth daily.  . Cholecalciferol (VITAMIN D3) 125 MCG (5000 UT) CAPS Take 1 capsule by mouth daily.  . Cyanocobalamin (VITAMIN B-12 SL) Place 1 tablet under the tongue daily.  . cyclobenzaprine (FLEXERIL) 5 MG tablet Take 1 tablet (5 mg total) by mouth 3 (three) times daily as needed for muscle spasms.  . Diclofenac Sodium (PENNSAID) 2 % SOLN Place 1 application onto the skin 2 (two) times daily.  Marland Kitchen escitalopram (LEXAPRO) 10 MG tablet TAKE 1 TABLET BY MOUTH EVERY DAY  . Ferrous Sulfate (IRON) 90 (18 Fe) MG TABS Take 18 mg by mouth daily.  . fluticasone (FLONASE) 50 MCG/ACT nasal spray Place 2 sprays into both nostrils daily as needed for allergies.  . metFORMIN (GLUCOPHAGE) 500 MG tablet Take 4 tablets (2,000 mg total) by mouth daily with breakfast.  . Multiple Vitamin (MULTIVITAMIN WITH MINERALS) TABS tablet Take 1 tablet by mouth daily.  . nitroGLYCERIN (NITROSTAT) 0.4 MG SL tablet Place 1 tablet (0.4 mg total) under the tongue every 5 (five) minutes as needed for chest pain.  . pregabalin (LYRICA) 100 MG capsule Take 1 capsule (100 mg total) by mouth 2 (two) times daily.  . SENNA CO by Combination route. As needed for  constipation.  . SENNA-PLUS 8.6-50 MG tablet TAKE 1 TABLET BY MOUTH DAILY AS NEEDED FOR MILD CONSTIPATION. REPORTED ON 03/22/2016  . warfarin (COUMADIN) 10 MG tablet TAKE 1 TABLET DAILY EXCEPT 2 TABLETS ON WED OR TAKE AS DIRECTED BY ANTICOAGULATION CLINIC   Past Medical History:  Diagnosis Date  . Arthritis   . BACK PAIN   . CAD in native artery    a. moderate by cardiac CT 2016.  Marland Kitchen COLONIC POLYPS, HX OF   . DEGENERATIVE JOINT DISEASE, KNEES, BILATERAL   . Depression   . Diabetes mellitus    type II  .  DIVERTICULOSIS, COLON   . DVT, lower extremity ([place])    right  . Hyperlipidemia   . Hypertension   . HYPOGONADISM, MALE   . HYPOTENSION, ORTHOSTATIC   . Kidney stones   . Long term current use of anticoagulant   . Obesity   . Obstructive sleep apnea    uses cpap setting 8 to 10  . Pulmonary emboli ([place])   . Renal insufficiency    a. prior h/o, does not appear chronic.   Past Surgical History:  Procedure Laterality Date  . ankle surgury  2001 bilateral   with bone spurs and torn tendon  . COLONOSCOPY    . EDG  2008   chronic Duodenitis  . GASTRIC BYPASS    . HEEL SPUR SURGERY Bilateral 2005  . knee surgury  2000 & 2003    cartilage damage  . LUMBAR LAMINECTOMY/DECOMPRESSION MICRODISCECTOMY  09/28/2012   Procedure: LUMBAR LAMINECTOMY/DECOMPRESSION MICRODISCECTOMY 2 LEVELS;  Surgeon: [name], MD;  Location: [facility];  Service: Orthopedics;  Laterality: Left;  Decompressive laminectomy L4-L5. Microdiscectomy L5-S1  . Skin removal surgery     extra abdominal skin removed from his weight loss  . TONSILLECTOMY AND ADENOIDECTOMY  age 13 or 13  . TOTAL KNEE ARTHROPLASTY  01/10/2012   Procedure: TOTAL KNEE ARTHROPLASTY;  Surgeon: [name], MD;  Location: [facility];  Service: Orthopedics;  Laterality: Left;   Social History   Social History Narrative   Daily caffeine use one per day   Protein drinks    Children; a friend and sister that helps   Brother that is around Legrand [place])   Son and dtr in [place]   Lives w/ wife in [place]   Left handed    family history includes Diabetes in his brother, sister, sister, and sister; Heart failure in his sister; Hypertension in his mother; Prostate cancer in his father.   Review of Systems As per HPI, some back pain muscle pains insomnia and headaches.  All other review of systems are negative. Objective:   Physical Exam BP 120/78   Pulse (!) 58   Ht 6\' 2"  (1.88 m)   Wt 278 lb (126.1 kg)   BMI 35.69 kg/m  NAD Eyes  anicteric Lungs cta Cor NL abd soft, NT BS+ Alert and oriented x 3

## 2020-05-13 NOTE — Patient Instructions (Addendum)
If you are age 67 or older, your body mass index should be between 23-30. Your Body mass index is 35.69 kg/m. If this is out of the aforementioned range listed, please consider follow up with your Primary Care Provider.  If you are age 42 or younger, your body mass index should be between 19-25. Your Body mass index is 35.69 kg/m. If this is out of the aformentioned range listed, please consider follow up with your Primary Care Provider.   You have been scheduled for a colonoscopy. Please follow written instructions given to you at your visit today.  Please pick up your prep supplies at the pharmacy within the next 1-3 days. If you use inhalers (even only as needed), please bring them with you on the day of your procedure.  You will be contaced by our office prior to your procedure for directions on holding your Coumadin/Warfarin.  If you do not hear from our office 1 week prior to your scheduled procedure, please call [phone] to discuss.

## 2020-05-13 NOTE — Telephone Encounter (Signed)
   [name] 1956-03-21 [id number]  Dear Dr. Jenny Reichmann:  We have scheduled the above named patient for a(n) colonoscopy procedure. Our records show that (s)he is on anticoagulation therapy.  Please advise as to whether the patient may come off their therapy of Warfarin 5 days prior to their procedure which is scheduled for Wednesday 06/720.  Please route your response to Patti Martinique, [place] or fax response to [phone].  Sincerely,    [facility]

## 2020-05-13 NOTE — Telephone Encounter (Signed)
[place] for off coumadin as reqeusted  Jenny Reichmann can we also provide lovenox bridging?  thanks

## 2020-05-14 NOTE — Telephone Encounter (Signed)
[name] informed and he knows to expect a call about the bridging from Dr Gwynn Burly office.

## 2020-05-14 NOTE — Telephone Encounter (Signed)
I spoke with patient today and this will be addressed at his coag visit on 6/22.

## 2020-05-26 ENCOUNTER — Telehealth: Payer: Self-pay | Admitting: Internal Medicine

## 2020-05-26 NOTE — Telephone Encounter (Signed)
New message:   Pt is calling and states he talked to a triage nurse on Friday about taking his medications twice but was told to call her and see what the Dr advise. Please advise.

## 2020-05-27 ENCOUNTER — Ambulatory Visit (INDEPENDENT_AMBULATORY_CARE_PROVIDER_SITE_OTHER): Payer: PPO | Admitting: General Practice

## 2020-05-27 ENCOUNTER — Other Ambulatory Visit: Payer: Self-pay

## 2020-05-27 ENCOUNTER — Telehealth: Payer: Self-pay | Admitting: General Practice

## 2020-05-27 DIAGNOSIS — Z7901 Long term (current) use of anticoagulants: Secondary | ICD-10-CM | POA: Diagnosis not present

## 2020-05-27 LAB — POCT INR: INR: 2.1 (ref 2.0–3.0)

## 2020-05-27 NOTE — Patient Instructions (Addendum)
Pre visit review using our clinic review tool, if applicable. No additional management support is needed unless otherwise documented below in the visit note.  Take 1 1/2 tablets today and then continue to take 1 tablet daily.  Please follow patient instructions for colonoscopy.  7/2 - Last dose of coumadin until after procedure 7/3 - Nothing 7/4 - Lovenox in the AM and PM 7/5 - Lovenox in the AM and PM 7/6 - Lovenox in the AM only 7/7 - Procedure (No Lovenox today) 7/8 - Lovenox in the AM and PM AND 15 mg of coumadin 7/9 - Lovenox in the AM and PM AND 15 mg of coumadin 7/10 - Lovenox in the AM and PM AND 15 mg of coumadin 7/11 - Lovenox in the AM and PM AND 15 mg of coumadin 712 - Stop Lovenox and continue to take coumadin 10 mg of coumadin daily. 7/13 - Re-check INR

## 2020-05-27 NOTE — Telephone Encounter (Signed)
-----   Message from Biagio Borg, MD sent at 05/27/2020  2:52 PM EDT ----- Regarding: RE: Lovenox bridge Yes, please that would be great ----- Message ----- From: Warden Fillers, RN Sent: 05/27/2020   2:40 PM EDT To: Biagio Borg, MD Subject: Lovenox bridge                                 Dr. Jenny Reichmann,  Patient will be having a colonoscopy on 7/7 and will need to stop warfarin for 5 days.  He has a CHADS score of 5 putting him in the high risk category for complications.  OK for Lovenox bridge?  Please advise.  Thanks, Villa Herb, RN

## 2020-05-27 NOTE — Progress Notes (Signed)
Medical screening examination/treatment/procedure(s) were performed by non-physician practitioner and as supervising physician I was immediately available for consultation/collaboration. I agree with above. Lory Nowaczyk, MD   

## 2020-05-27 NOTE — Telephone Encounter (Signed)
Spoke with pt in person as he is in the office seeing Jenny Reichmann for coumadin today.Pt states he is feeling good and will call us if he would like to come in soon then August he will call the clinic.

## 2020-05-28 ENCOUNTER — Other Ambulatory Visit: Payer: Self-pay | Admitting: General Practice

## 2020-05-28 DIAGNOSIS — Z7901 Long term (current) use of anticoagulants: Secondary | ICD-10-CM

## 2020-05-28 MED ORDER — ENOXAPARIN SODIUM 120 MG/0.8ML ~~LOC~~ SOLN: 120.0000 mg | Freq: Two times a day (BID) | SUBCUTANEOUS | 0 refills | Status: DC

## 2020-06-11 ENCOUNTER — Encounter: Payer: Self-pay | Admitting: Internal Medicine

## 2020-06-11 ENCOUNTER — Telehealth: Payer: Self-pay | Admitting: Internal Medicine

## 2020-06-11 NOTE — Telephone Encounter (Signed)
Ok to try again, but no lovenox day of procedure

## 2020-06-11 NOTE — Telephone Encounter (Signed)
I have had a chance to see chart - thanks to all  Let's have him follow his resumption of warfarin as per the information he has  He will need prep instructions and another visit with Caren Griffins to plan out his anticoagulation before next attempt at colonoscopy 8/6.  Lanelle Bal - please communicate with him again today about follwing his plan to restart the warfarin as Caren Griffins laid out.  He is due for an INR at the end and will ask Caren Griffins to set up his repeat instructions

## 2020-06-11 NOTE — Telephone Encounter (Signed)
Patient called and states that he took his Lovenox injection this morning.  He was to hold this today (day of procedure).   Explained to pt that this puts him at risk for bleeding.   Per Dr. Carlean Purl patient will have to reschedule procedure to a later time.    Dr. Jenny Reichmann and Caren Griffins, Please advise pt on resuming Coumadin.  Thank you

## 2020-06-11 NOTE — Telephone Encounter (Signed)
Phoned pt and instructed him to follow Lovenox/ Coumadin instructions as given by Dr. Gwynn Burly office.  He verbalized understanding.  Also scheduled pt for PV with nurse prior to procedure to review instructions.

## 2020-06-17 ENCOUNTER — Ambulatory Visit (INDEPENDENT_AMBULATORY_CARE_PROVIDER_SITE_OTHER): Payer: PPO | Admitting: General Practice

## 2020-06-17 ENCOUNTER — Other Ambulatory Visit: Payer: Self-pay | Admitting: General Practice

## 2020-06-17 ENCOUNTER — Other Ambulatory Visit: Payer: Self-pay

## 2020-06-17 DIAGNOSIS — Z7901 Long term (current) use of anticoagulants: Secondary | ICD-10-CM | POA: Diagnosis not present

## 2020-06-17 LAB — POCT INR: INR: 1.9 — AB (ref 2.0–3.0)

## 2020-06-17 MED ORDER — ENOXAPARIN SODIUM 120 MG/0.8ML ~~LOC~~ SOLN: 120.0000 mg | Freq: Two times a day (BID) | SUBCUTANEOUS | 0 refills | Status: DC

## 2020-06-17 NOTE — Progress Notes (Signed)
Medical screening examination/treatment/procedure(s) were performed by non-physician practitioner and as supervising physician I was immediately available for consultation/collaboration. I agree with above. Makaylyn Sinyard, MD   

## 2020-06-17 NOTE — Patient Instructions (Addendum)
Pre visit review using our clinic review tool, if applicable. No additional management support is needed unless otherwise documented below in the visit note.  Take 1 1/2 tablets today and then continue to take 1 tablet daily.  Please follow patient instructions for colonoscopy.  Re-check on 8/12.   8/1 - Last dose of coumadin until after procedure 8/2 - Nothing (No coumadin and No Lovenox) 8/3 - Lovenox in the AM and PM (12 hours apart) 8/4 - Lovenox in the AM and PM 8/5 - Lovenox in the AM only (Take by 7 am) 8/6 - Procedure (DO NOT TAKE LOVENOX TODAY)!! 8/7 - Lovenox in the AM and PM AND 15 mg of coumadin 8/8 - Lovenox in the AM and PM AND 15 mg of coumadin 8/9 - Lovenox in the AM and PM AND 15 mg of coumadin 8/10 - Lovenox in the AM and PM AND 15 mg of coumadin 8/11 - Stop Lovenox and continue coumadin and continue to take 1 tablet daily  8/12 - Check INR

## 2020-07-02 ENCOUNTER — Telehealth: Payer: Self-pay | Admitting: *Deleted

## 2020-07-02 NOTE — Telephone Encounter (Signed)
Phoned pt again with no answer.  Procedure appointment cancelled and letter sent.

## 2020-07-02 NOTE — Telephone Encounter (Signed)
LM on VM for pt to call back to reschedule PV for upcoming procedure on Friday 07/11/20.

## 2020-07-05 ENCOUNTER — Other Ambulatory Visit: Payer: Self-pay | Admitting: Internal Medicine

## 2020-07-05 DIAGNOSIS — Z7901 Long term (current) use of anticoagulants: Secondary | ICD-10-CM

## 2020-07-05 NOTE — Telephone Encounter (Signed)
Please ask pt to request refills from coumadin clinic

## 2020-07-07 ENCOUNTER — Encounter: Payer: Self-pay | Admitting: Internal Medicine

## 2020-07-09 ENCOUNTER — Ambulatory Visit (INDEPENDENT_AMBULATORY_CARE_PROVIDER_SITE_OTHER): Payer: PPO | Admitting: Internal Medicine

## 2020-07-09 ENCOUNTER — Other Ambulatory Visit: Payer: Self-pay

## 2020-07-09 ENCOUNTER — Encounter: Payer: Self-pay | Admitting: Internal Medicine

## 2020-07-09 ENCOUNTER — Ambulatory Visit (INDEPENDENT_AMBULATORY_CARE_PROVIDER_SITE_OTHER): Payer: PPO

## 2020-07-09 VITALS — BP 120/80 | HR 65 | Temp 98.5°F | Ht 74.0 in | Wt 277.0 lb

## 2020-07-09 DIAGNOSIS — E559 Vitamin D deficiency, unspecified: Secondary | ICD-10-CM

## 2020-07-09 DIAGNOSIS — E782 Mixed hyperlipidemia: Secondary | ICD-10-CM | POA: Diagnosis not present

## 2020-07-09 DIAGNOSIS — R1032 Left lower quadrant pain: Secondary | ICD-10-CM

## 2020-07-09 DIAGNOSIS — I10 Essential (primary) hypertension: Secondary | ICD-10-CM | POA: Diagnosis not present

## 2020-07-09 DIAGNOSIS — M160 Bilateral primary osteoarthritis of hip: Secondary | ICD-10-CM | POA: Diagnosis not present

## 2020-07-09 DIAGNOSIS — R1013 Epigastric pain: Secondary | ICD-10-CM | POA: Diagnosis not present

## 2020-07-09 DIAGNOSIS — E1122 Type 2 diabetes mellitus with diabetic chronic kidney disease: Secondary | ICD-10-CM

## 2020-07-09 DIAGNOSIS — N183 Chronic kidney disease, stage 3 unspecified: Secondary | ICD-10-CM

## 2020-07-09 HISTORY — DX: Vitamin D deficiency, unspecified: E55.9

## 2020-07-09 MED ORDER — PANTOPRAZOLE SODIUM 40 MG PO TBEC
40.0000 mg | DELAYED_RELEASE_TABLET | Freq: Every day | ORAL | 11 refills | Status: DC
Start: 2020-07-09 — End: 2021-06-02

## 2020-07-09 NOTE — Assessment & Plan Note (Signed)
stable overall by history and exam, recent data reviewed with pt, and pt to continue medical treatment as before,  to f/u any worsening symptoms or concerns  

## 2020-07-09 NOTE — Assessment & Plan Note (Signed)
For PPI, abd u/s, labs today

## 2020-07-09 NOTE — Assessment & Plan Note (Addendum)
?   Hip djd - for film, consider sport med referral  I spent 41 minutes in preparing to see the patient by review of recent labs, imaging and procedures, obtaining and reviewing separately obtained history, communicating with the patient and family or caregiver, ordering medications, tests or procedures, and documenting clinical information in the EHR including the differential Dx, treatment, and any further evaluation and other management of left groin pain, dyspepsia, dm, hld, ht, vit d deficien

## 2020-07-09 NOTE — Progress Notes (Signed)
Subjective:    Patient ID: [name], male    DOB: January 06, 1956, 64 y.o.   MRN: [id number]  HPI  Here to f/u; overall doing ok,  Pt denies chest pain, increasing sob or doe, wheezing, orthopnea, PND, increased LE swelling, palpitations, dizziness or syncope.  Pt denies new neurological symptoms such as new headache, or facial or extremity weakness or numbness.  Pt denies polydipsia, polyuria, or low sugar episode.  Pt states overall good compliance with meds, mostly trying to follow appropriate diet, with wt overall stable,  but little exercise however. Denies worsening reflux, abd pain, dysphagia, n/v, bowel change or blood except for mild vague upper abd discomfort nausea and gurgling after eating for 2 wks. Lost wt with better diet.  Wt Readings from Last 3 Encounters:  07/09/20 277 lb (125.6 kg)  05/13/20 278 lb (126.1 kg)  04/15/20 283 lb (128.4 kg)  Has colonoscopy scheduled soon  Also c/o left groin pain worse to stand or squat for several months Past Medical History:  Diagnosis Date  . Arthritis   . BACK PAIN   . CAD in native artery    a. moderate by cardiac CT 2016.  Marland Kitchen COLONIC POLYPS, HX OF   . DEGENERATIVE JOINT DISEASE, KNEES, BILATERAL   . Depression   . Diabetes mellitus    type II  . DIVERTICULOSIS, COLON   . DVT, lower extremity ([place])    right  . Hyperlipidemia   . Hypertension   . HYPOGONADISM, MALE   . HYPOTENSION, ORTHOSTATIC   . Kidney stones   . Long term current use of anticoagulant   . Obesity   . Obstructive sleep apnea    uses cpap setting 8 to 10  . Pulmonary emboli ([place])   . Renal insufficiency    a. prior h/o, does not appear chronic.   Past Surgical History:  Procedure Laterality Date  . ankle surgury  2001 bilateral   with bone spurs and torn tendon  . COLONOSCOPY    . EDG  2008   chronic Duodenitis  . GASTRIC BYPASS    . HEEL SPUR SURGERY Bilateral 2005  . knee surgury  2000 & 2003    cartilage damage  . LUMBAR  LAMINECTOMY/DECOMPRESSION MICRODISCECTOMY  09/28/2012   Procedure: LUMBAR LAMINECTOMY/DECOMPRESSION MICRODISCECTOMY 2 LEVELS;  Surgeon: [name], MD;  Location: [facility];  Service: Orthopedics;  Laterality: Left;  Decompressive laminectomy L4-L5. Microdiscectomy L5-S1  . Skin removal surgery     extra abdominal skin removed from his weight loss  . TONSILLECTOMY AND ADENOIDECTOMY  age 22 or 43  . TOTAL KNEE ARTHROPLASTY  01/10/2012   Procedure: TOTAL KNEE ARTHROPLASTY;  Surgeon: [name], MD;  Location: [facility];  Service: Orthopedics;  Laterality: Left;    reports that he quit smoking about 41 years ago. He has a 1.25 pack-year smoking history. He has never used smokeless tobacco. He reports current alcohol use. He reports that he does not use drugs. family history includes Diabetes in his brother, sister, sister, and sister; Heart failure in his sister; Hypertension in his mother; Prostate cancer in his father. Allergies  Allergen Reactions  . Adhesive [Tape] Other (See Comments)    Painful, Please use "paper" tape  . Other     NO BLOOD -JEHOVAH'S WITNESS-SIGNED REFUSAL BUT WOULD TAKE ALBUMIN   Current Outpatient Medications on File Prior to Visit  Medication Sig Dispense Refill  . Artificial Tear Ointment (ARTIFICIAL TEARS) ointment Place 1 drop into  both eyes as needed (for dry eyes).    Marland Kitchen aspirin EC 81 MG tablet Take 81 mg by mouth daily.    Marland Kitchen atorvastatin (LIPITOR) 80 MG tablet TAKE 1 TABLET BY MOUTH EVERY DAY 90 tablet 1  . Calcium Carbonate (CALCI-CHEW PO) Take 1 tablet by mouth daily.     . chlorthalidone (HYGROTON) 25 MG tablet Take 1 tablet (25 mg total) by mouth daily. 90 tablet 1  . Cholecalciferol (VITAMIN D3) 125 MCG (5000 UT) CAPS Take 1 capsule by mouth daily.    . Cyanocobalamin (VITAMIN B-12 SL) Place 1 tablet under the tongue daily.    . cyclobenzaprine (FLEXERIL) 5 MG tablet Take 1 tablet (5 mg total) by mouth 3 (three) times daily as needed for muscle spasms. 40  tablet 0  . Diclofenac Sodium (PENNSAID) 2 % SOLN Place 1 application onto the skin 2 (two) times daily. 1 Bottle 3  . enoxaparin (LOVENOX) 120 MG/0.8ML injection Inject 0.8 mLs (120 mg total) into the skin every 12 (twelve) hours. 10.4 mL 0  . escitalopram (LEXAPRO) 10 MG tablet TAKE 1 TABLET BY MOUTH EVERY DAY 90 tablet 3  . Ferrous Sulfate (IRON) 90 (18 Fe) MG TABS Take 18 mg by mouth daily.    . fluticasone (FLONASE) 50 MCG/ACT nasal spray Place 2 sprays into both nostrils daily as needed for allergies.    . metFORMIN (GLUCOPHAGE) 500 MG tablet Take 4 tablets (2,000 mg total) by mouth daily with breakfast. 360 tablet 3  . Multiple Vitamin (MULTIVITAMIN WITH MINERALS) TABS tablet Take 1 tablet by mouth daily.    . nitroGLYCERIN (NITROSTAT) 0.4 MG SL tablet Place 1 tablet (0.4 mg total) under the tongue every 5 (five) minutes as needed for chest pain. 90 tablet 3  . pregabalin (LYRICA) 100 MG capsule Take 1 capsule (100 mg total) by mouth 2 (two) times daily. 180 capsule 1  . SENNA CO by Combination route. As needed for constipation.    . SENNA-PLUS 8.6-50 MG tablet TAKE 1 TABLET BY MOUTH DAILY AS NEEDED FOR MILD CONSTIPATION. REPORTED ON 03/22/2016 90 tablet 1  . warfarin (COUMADIN) 10 MG tablet TAKE 1 TABLET DAILY EXCEPT 2 TABLETS ON WED OR TAKE AS DIRECTED BY ANTICOAGULATION CLINIC 102 tablet 1   No current facility-administered medications on file prior to visit.   Review of Systems All otherwise neg per pt     Objective:   Physical Exam BP 120/80 (BP Location: Left Arm, Patient Position: Sitting, Cuff Size: Large)   Pulse 65   Temp 98.5 F (36.9 C) (Oral)   Ht 6\' 2"  (1.88 m)   Wt 277 lb (125.6 kg)   SpO2 95%   BMI 35.56 kg/m  VS noted,  Constitutional: Pt appears in NAD HENT: Head: NCAT.  Right Ear: External ear normal.  Left Ear: External ear normal.  Eyes: . Pupils are equal, round, and reactive to light. Conjunctivae and EOM are normal Nose: without d/c or  deformity Neck: Neck supple. Gross normal ROM Cardiovascular: Normal rate and regular rhythm.   Pulmonary/Chest: Effort normal and breath sounds without rales or wheezing.  Abd:  Soft, NT, ND, + BS, no organomegaly Neurological: Pt is alert. At baseline orientation, motor grossly intact Skin: Skin is warm. No rashes, other new lesions, no LE edema Psychiatric: Pt behavior is normal without agitation  All otherwise neg per pt Lab Results  Component Value Date   WBC 6.7 01/15/2020   HGB 13.1 01/15/2020   HCT 42.3 01/15/2020  PLT 163.0 01/15/2020   GLUCOSE 129 (H) 01/15/2020   CHOL 216 (H) 01/15/2020   TRIG 240.0 (H) 01/15/2020   HDL 40.10 01/15/2020   LDLDIRECT 116.0 01/15/2020   LDLCALC 67 12/11/2018   ALT 20 01/15/2020   AST 17 01/15/2020   NA 139 01/15/2020   K 4.0 01/15/2020   CL 101 01/15/2020   CREATININE 1.39 01/15/2020   BUN 26 (H) 01/15/2020   CO2 30 01/15/2020   TSH 2.05 01/15/2020   PSA 2.92 01/15/2020   INR 1.9 (A) 06/17/2020   HGBA1C 8.8 (H) 01/15/2020   MICROALBUR 2.1 (H) 01/15/2020      Assessment & Plan:

## 2020-07-09 NOTE — Assessment & Plan Note (Signed)
For oral replacement 

## 2020-07-09 NOTE — Patient Instructions (Signed)
Please take all new medication as prescribed - the protonix  You will be contacted regarding the referral for: ultrasound - abdomen  Please continue all other medications as before, and refills have been done if requested.  Please have the pharmacy call with any other refills you may need.  Please continue your efforts at being more active, low cholesterol diet, and weight control.  Please keep your appointments with your specialists as you may have planned  .Please go to the XRAY Department in the first floor for the x-ray testing  Please go to the LAB at the blood drawing area for the tests to be done  You will be contacted by phone if any changes need to be made immediately.  Otherwise, you will receive a letter about your results with an explanation, but please check with MyChart first.  Please remember to sign up for MyChart if you have not done so, as this will be important to you in the future with finding out test results, communicating by private email, and scheduling acute appointments online when needed.  Please make an Appointment to return in 6 months, or sooner if needed, also with Lab Appointment for testing done 3-5 days before at the [facility] (so this is for TWO appointments - please see the scheduling desk as you leave)

## 2020-07-10 LAB — LIPID PANEL
Cholesterol: 137 mg/dL (ref <200)
HDL: 44 mg/dL (ref >=40)
LDL Cholesterol (Calc): 74 mg/dL (calc)
Non-HDL Cholesterol (Calc): 93 mg/dL (calc) (ref <130)
Total CHOL/HDL Ratio: 3.1 (calc) (ref <5.0)
Triglycerides: 103 mg/dL (ref <150)

## 2020-07-10 LAB — COMPLETE METABOLIC PANEL WITH GFR
AG Ratio: 1.8 (calc) (ref 1.0–2.5)
ALT: 21 U/L (ref 9–46)
AST: 17 U/L (ref 10–35)
Albumin: 4.5 g/dL (ref 3.6–5.1)
Alkaline phosphatase (APISO): 62 U/L (ref 35–144)
BUN: 22 mg/dL (ref 7–25)
CO2: 28 mmol/L (ref 20–32)
Calcium: 9.4 mg/dL (ref 8.6–10.3)
Chloride: 104 mmol/L (ref 98–110)
Creat: 1.22 mg/dL (ref 0.70–1.25)
GFR, Est African American: 71 mL/min/{1.73_m2} (ref >=60)
GFR, Est Non African American: 61 mL/min/{1.73_m2} (ref >=60)
Globulin: 2.5 g/dL (calc) (ref 1.9–3.7)
Glucose, Bld: 109 mg/dL — ABNORMAL HIGH (ref 65–99)
Potassium: 4.3 mmol/L (ref 3.5–5.3)
Sodium: 141 mmol/L (ref 135–146)
Total Bilirubin: 0.5 mg/dL (ref 0.2–1.2)
Total Protein: 7 g/dL (ref 6.1–8.1)

## 2020-07-10 LAB — HEMOGLOBIN A1C
Hgb A1c MFr Bld: 7.5 % of total Hgb — ABNORMAL HIGH (ref <5.7)
Mean Plasma Glucose: 169 (calc)
eAG (mmol/L): 9.3 (calc)

## 2020-07-10 LAB — VITAMIN D 25 HYDROXY (VIT D DEFICIENCY, FRACTURES): Vit D, 25-Hydroxy: 34 ng/mL (ref 30–100)

## 2020-07-11 ENCOUNTER — Encounter: Payer: Self-pay | Admitting: Internal Medicine

## 2020-07-11 LAB — H. PYLORI ANTIBODY, IGG: H. pylori, IgG AbS: 0.84 Index Value — ABNORMAL HIGH (ref 0.00–0.79)

## 2020-07-17 ENCOUNTER — Other Ambulatory Visit: Payer: Self-pay | Admitting: Internal Medicine

## 2020-07-17 ENCOUNTER — Other Ambulatory Visit: Payer: Self-pay

## 2020-07-17 ENCOUNTER — Ambulatory Visit (INDEPENDENT_AMBULATORY_CARE_PROVIDER_SITE_OTHER): Payer: PPO | Admitting: General Practice

## 2020-07-17 ENCOUNTER — Encounter: Payer: Self-pay | Admitting: Internal Medicine

## 2020-07-17 DIAGNOSIS — Z7901 Long term (current) use of anticoagulants: Secondary | ICD-10-CM | POA: Diagnosis not present

## 2020-07-17 LAB — POCT INR: INR: 2.3 (ref 2.0–3.0)

## 2020-07-17 MED ORDER — GLIPIZIDE ER 2.5 MG PO TB24: 2.5000 mg | ORAL_TABLET | Freq: Every day | ORAL | 3 refills | Status: DC

## 2020-07-17 NOTE — Progress Notes (Signed)
Medical screening examination/treatment/procedure(s) were performed by non-physician practitioner and as supervising physician I was immediately available for consultation/collaboration. I agree with above. Berenice Oehlert, MD   

## 2020-07-17 NOTE — Patient Instructions (Addendum)
Pre visit review using our clinic review tool, if applicable. No additional management support is needed unless otherwise documented below in the visit note.  Continue to take 1 tablet daily.  Please follow patient instructions for colonoscopy.  Re-check on 9/7.   8/27 - Last dose of coumadin until after procedure 8/28 - Nothing (No coumadin and No Lovenox) 8/29 - Lovenox in the AM and PM 8/30 - Lovenox in the AM and PM 8/31 - Lovenox in the AM only (Please take by 7 am) 9/1 - Procedure (DO NOT TAKE LOVENOX TODAY 9/2 - Lovenox in the AM and PM and 1 1/2 tablets of coumadin  9/3 - Lovenox in the AM and PM and 1 1/2 tablets of coumadin 9/4 - Lovenox in the AM and PM and 1 1/2 tablets of coumadin 9/5 - Lovenox in the AM and PM and 1 1/2 tablets of coumadin 9/6 - Stop Lovenox and continue to take 1 tablet of coumadin daily 9/7 - Check INR

## 2020-07-21 ENCOUNTER — Ambulatory Visit (AMBULATORY_SURGERY_CENTER): Payer: Self-pay | Admitting: *Deleted

## 2020-07-21 ENCOUNTER — Encounter: Payer: Self-pay | Admitting: Internal Medicine

## 2020-07-21 ENCOUNTER — Other Ambulatory Visit: Payer: Self-pay

## 2020-07-21 VITALS — Ht 74.0 in | Wt 273.0 lb

## 2020-07-21 DIAGNOSIS — Z1211 Encounter for screening for malignant neoplasm of colon: Secondary | ICD-10-CM

## 2020-07-21 NOTE — Progress Notes (Signed)
Patient is here in-person for PV. Patient denies any allergies to eggs or soy. Patient denies any problems with anesthesia/sedation. Patient denies any oxygen use at home. Patient denies taking any diet/weight loss medications.ON blood thinner Coumadin-pt aware of coumadin clinic instructions for lovenox. Patient is not being treated for MRSA or C-diff. Patient is aware of our care-partner policy and [id number] safety protocol. EMMI education sent via [facility]. Patient states he has nausea after he eats at times, offered OV to speak with Dr.Gessner about this, pt declined. He will discuss with him the day of procedure.   COVID-19 vaccines completed per pt 01/27/20.

## 2020-07-22 ENCOUNTER — Encounter: Payer: Self-pay | Admitting: Internal Medicine

## 2020-07-22 ENCOUNTER — Ambulatory Visit
Admission: RE | Admit: 2020-07-22 | Discharge: 2020-07-22 | Disposition: A | Discharge status: Home / Self Care | Payer: PPO | Source: Ambulatory Visit | Attending: Internal Medicine | Admitting: Internal Medicine

## 2020-07-22 DIAGNOSIS — K760 Fatty (change of) liver, not elsewhere classified: Secondary | ICD-10-CM | POA: Diagnosis not present

## 2020-07-22 DIAGNOSIS — K7689 Other specified diseases of liver: Secondary | ICD-10-CM | POA: Diagnosis not present

## 2020-07-22 DIAGNOSIS — R1013 Epigastric pain: Secondary | ICD-10-CM

## 2020-07-22 DIAGNOSIS — N1330 Unspecified hydronephrosis: Secondary | ICD-10-CM | POA: Diagnosis not present

## 2020-07-22 DIAGNOSIS — N281 Cyst of kidney, acquired: Secondary | ICD-10-CM | POA: Diagnosis not present

## 2020-07-23 ENCOUNTER — Other Ambulatory Visit: Payer: Self-pay | Admitting: Internal Medicine

## 2020-07-23 DIAGNOSIS — Z9884 Bariatric surgery status: Secondary | ICD-10-CM | POA: Diagnosis not present

## 2020-07-23 DIAGNOSIS — Z713 Dietary counseling and surveillance: Secondary | ICD-10-CM | POA: Diagnosis not present

## 2020-07-23 NOTE — Telephone Encounter (Signed)
Please refill as per office routine med refill policy (all routine meds refilled for 3 mo or monthly per pt preference up to one year from last visit, then month to month grace period for 3 mo, then further med refills will have to be denied)  

## 2020-07-30 ENCOUNTER — Other Ambulatory Visit: Payer: Self-pay | Admitting: Internal Medicine

## 2020-07-30 ENCOUNTER — Ambulatory Visit: Payer: PPO | Admitting: Internal Medicine

## 2020-07-30 MED ORDER — ATORVASTATIN CALCIUM 80 MG PO TABS: 80.0000 mg | ORAL_TABLET | Freq: Every day | ORAL | 3 refills | Status: DC

## 2020-08-06 ENCOUNTER — Other Ambulatory Visit: Payer: Self-pay

## 2020-08-06 ENCOUNTER — Encounter: Payer: Self-pay | Admitting: Internal Medicine

## 2020-08-06 ENCOUNTER — Ambulatory Visit (AMBULATORY_SURGERY_CENTER): Payer: PPO | Admitting: Internal Medicine

## 2020-08-06 VITALS — BP 132/77 | HR 45 | Temp 98.6°F | Resp 17 | Ht 74.0 in | Wt 273.0 lb

## 2020-08-06 DIAGNOSIS — I2510 Atherosclerotic heart disease of native coronary artery without angina pectoris: Secondary | ICD-10-CM | POA: Diagnosis not present

## 2020-08-06 DIAGNOSIS — E119 Type 2 diabetes mellitus without complications: Secondary | ICD-10-CM | POA: Diagnosis not present

## 2020-08-06 DIAGNOSIS — Z1211 Encounter for screening for malignant neoplasm of colon: Secondary | ICD-10-CM | POA: Diagnosis not present

## 2020-08-06 DIAGNOSIS — I10 Essential (primary) hypertension: Secondary | ICD-10-CM | POA: Diagnosis not present

## 2020-08-06 DIAGNOSIS — G4733 Obstructive sleep apnea (adult) (pediatric): Secondary | ICD-10-CM | POA: Diagnosis not present

## 2020-08-06 MED ORDER — SODIUM CHLORIDE 0.9 % IV SOLN: 500.0000 mL | Freq: Once | INTRAVENOUS | Status: DC

## 2020-08-06 NOTE — Progress Notes (Signed)
Come see Dr. Carlean Purl in about a month if stomach is not doing better.  May want to follow up.

## 2020-08-06 NOTE — Patient Instructions (Addendum)
The colonoscopy was normal - no polyps or cancer seen.  Please resume blood thinners as instructed by anti-coagulation clinic (see below also - I copied what they instructed).   9/1 - Procedure (DO NOT TAKE LOVENOX TODAY 9/2 - Lovenox in the AM and PM and 1 1/2 tablets of coumadin  9/3 - Lovenox in the AM and PM and 1 1/2 tablets of coumadin 9/4 - Lovenox in the AM and PM and 1 1/2 tablets of coumadin 9/5 - Lovenox in the AM and PM and 1 1/2 tablets of coumadin 9/6 - Stop Lovenox and continue to take 1 tablet of coumadin daily 9/7 - Check INR  Generally stop routine colonoscopy after age 49 - your next would be due in 2031 and you will be 1  - will let you and Dr. Jenny Reichmann or other PCP decide whether to repeat then.  I appreciate the opportunity to care for you. Gatha Mayer, MD, FACG  YOU HAD AN ENDOSCOPIC PROCEDURE TODAY AT [facility]:   Refer to the procedure report that was given to you for any specific questions about what was found during the examination.  If the procedure report does not answer your questions, please call your gastroenterologist to clarify.  If you requested that your care partner not be given the details of your procedure findings, then the procedure report has been included in a sealed envelope for you to review at your convenience later.  YOU SHOULD EXPECT: Some feelings of bloating in the abdomen. Passage of more gas than usual.  Walking can help get rid of the air that was put into your GI tract during the procedure and reduce the bloating. If you had a lower endoscopy (such as a colonoscopy or flexible sigmoidoscopy) you may notice spotting of blood in your stool or on the toilet paper. If you underwent a bowel prep for your procedure, you may not have a normal bowel movement for a few days.  Please Note:  You might notice some irritation and congestion in your nose or some drainage.  This is from the oxygen used during your procedure.  There is  no need for concern and it should clear up in a day or so.  SYMPTOMS TO REPORT IMMEDIATELY:   Following lower endoscopy (colonoscopy or flexible sigmoidoscopy):  Excessive amounts of blood in the stool  Significant tenderness or worsening of abdominal pains  Swelling of the abdomen that is new, acute  Fever of 100F or higher  For urgent or emergent issues, a gastroenterologist can be reached at any hour by calling [phone]. Do not use MyChart messaging for urgent concerns.    DIET:  We do recommend a small meal at first, but then you may proceed to your regular diet.  Drink plenty of fluids but you should avoid alcoholic beverages for 24 hours.  ACTIVITY:  You should plan to take it easy for the rest of today and you should NOT DRIVE or use heavy machinery until tomorrow (because of the sedation medicines used during the test).    FOLLOW UP: Our staff will call the number listed on your records 48-72 hours following your procedure to check on you and address any questions or concerns that you may have regarding the information given to you following your procedure. If we do not reach you, we will leave a message.  We will attempt to reach you two times.  During this call, we will ask if you have developed  any symptoms of COVID 19. If you develop any symptoms (ie: fever, flu-like symptoms, shortness of breath, cough etc.) before then, please call [phone].  If you test positive for Covid 19 in the 2 weeks post procedure, please call and report this information to [place].    If any biopsies were taken you will be contacted by phone or by letter within the next 1-3 weeks.  Please call us at [phone] if you have not heard about the biopsies in 3 weeks.    SIGNATURES/CONFIDENTIALITY: You and/or your care partner have signed paperwork which will be entered into your electronic medical record.  These signatures attest to the fact that that the information above on your After Visit  Summary has been reviewed and is understood.  Full responsibility of the confidentiality of this discharge information lies with you and/or your care-partner.

## 2020-08-06 NOTE — Progress Notes (Signed)
Report to PACU, RN, vss, BBS= Clear.  

## 2020-08-06 NOTE — Op Note (Signed)
[facility] Patient Name: [name] Procedure Date: 08/06/2020 2:35 PM MRN: [id number] Endoscopist: Gatha Mayer , MD Age: 64 Referring MD:  Date of Birth: 1956-03-09 Gender: Male Account #: [id number] Procedure:                Colonoscopy Indications:              Screening for colorectal malignant neoplasm Medicines:                Propofol per Anesthesia, Monitored Anesthesia Care Procedure:                Pre-Anesthesia Assessment:                           - Prior to the procedure, a History and Physical                            was performed, and patient medications and                            allergies were reviewed. The patient's tolerance of                            previous anesthesia was also reviewed. The risks                            and benefits of the procedure and the sedation                            options and risks were discussed with the patient.                            All questions were answered, and informed consent                            was obtained. Prior Anticoagulants: The patient                            last took Coumadin (warfarin) 5 days and Lovenox                            (enoxaparin) 1 day prior to the procedure. ASA                            Grade Assessment: II - A patient with mild systemic                            disease. After reviewing the risks and benefits,                            the patient was deemed in satisfactory condition to                            undergo the procedure.  After obtaining informed consent, the colonoscope                            was passed under direct vision. Throughout the                            procedure, the patient's blood pressure, pulse, and                            oxygen saturations were monitored continuously. The                            Colonoscope was introduced through the anus and                            advanced to  the the cecum, identified by                            appendiceal orifice and ileocecal valve. The                            colonoscopy was performed without difficulty. The                            patient tolerated the procedure well. The quality                            of the bowel preparation was good. The bowel                            preparation used was Miralax via split dose                            instruction. The ileocecal valve, appendiceal                            orifice, and rectum were photographed. Scope In: 2:42:26 PM Scope Out: 2:56:15 PM Scope Withdrawal Time: 0 hours 10 minutes 24 seconds  Total Procedure Duration: 0 hours 13 minutes 49 seconds  Findings:                 The perianal and digital rectal examinations were                            normal.                           The entire examined colon appeared normal on direct                            and retroflexion views. Complications:            No immediate complications. Estimated Blood Loss:     Estimated blood loss: none. Impression:               - The entire examined colon is  normal on direct and                            retroflexion views.                           - No specimens collected. Recommendation:           - Patient has a contact number available for                            emergencies. The signs and symptoms of potential                            delayed complications were discussed with the                            patient. Return to normal activities tomorrow.                            Written discharge instructions were provided to the                            patient.                           - No recommendation at this time regarding repeat                            colonoscopy due to age. He will be 74 in 10 years -                            will let him decide with PCP then. Note he has a                            remote hx of a rectal hyperplastic  polyp but that                            was NOT pre-cancerous so he is average risk                            screening                           - Resume previous diet.                           - Continue present medications.                           - Resume Coumadin (warfarin) tomorrow and Lovenox                            (enoxaparin) tomorrow at prior doses. Refer to  managing physician for further adjustment of                            therapy. Gatha Mayer, MD 08/06/2020 3:08:23 PM This report has been signed electronically.

## 2020-08-06 NOTE — Progress Notes (Signed)
VS- [name]  Pt's states no medical or surgical changes since previsit or office visit.  

## 2020-08-08 ENCOUNTER — Telehealth: Payer: Self-pay | Admitting: *Deleted

## 2020-08-08 NOTE — Telephone Encounter (Signed)
°  Follow up Call-  Call back number 08/06/2020  Post procedure Call Back phone  # [phone]  Permission to leave phone message Yes  Some recent data might be hidden     Patient questions:  Do you have a fever, pain , or abdominal swelling? No. Pain Score  0 *  Have you tolerated food without any problems? Yes.    Have you been able to return to your normal activities? Yes.    Do you have any questions about your discharge instructions: Diet   No. Medications  No. Follow up visit  No.  Do you have questions or concerns about your Care? No.  Actions: * If pain score is 4 or above: No action needed, pain <4.  1. Have you developed a fever since your procedure? no  2.   Have you had an respiratory symptoms (SOB or cough) since your procedure? no  3.   Have you tested positive for COVID 19 since your procedure no  4.   Have you had any family members/close contacts diagnosed with the COVID 19 since your procedure?  no   If yes to any of these questions please route to Joylene John, RN and Joella Prince, RN

## 2020-08-12 ENCOUNTER — Ambulatory Visit (INDEPENDENT_AMBULATORY_CARE_PROVIDER_SITE_OTHER): Payer: PPO | Admitting: General Practice

## 2020-08-12 ENCOUNTER — Other Ambulatory Visit: Payer: Self-pay

## 2020-08-12 DIAGNOSIS — Z7901 Long term (current) use of anticoagulants: Secondary | ICD-10-CM

## 2020-08-12 LAB — POCT INR: INR: 1.9 — AB (ref 2.0–3.0)

## 2020-08-12 NOTE — Patient Instructions (Addendum)
Pre visit review using our clinic review tool, if applicable. No additional management support is needed unless otherwise documented below in the visit note.  Take 1 1/2 tablets today and then continue to take 1 tablet daily.  Re-check in 6 weeks.

## 2020-08-18 ENCOUNTER — Telehealth: Payer: Self-pay | Admitting: Internal Medicine

## 2020-08-18 NOTE — Telephone Encounter (Signed)
Wife called to report patient with continued abdominal pain with meals.  Wife and patient discussed with Dr. Carlean Purl at the colonoscopy and Dr. Julio Sicks would consider EGD if symptoms persist.  Dr. Carlean Purl, please review.  They are aware that if you want to proceed with EGD. Will be scheduled with a partner.

## 2020-08-18 NOTE — Telephone Encounter (Signed)
We did have a conversation after colonoscopy about epigastric pain and decided to do an EGD if symptoms persisted.  So agree with plans for EGD with one of my partners.  The patient is on warfarin will defer to partner preference as to whether or not they want to hold it prior to diagnostic EGD which I think is optional since it is a low risk procedure.

## 2020-08-18 NOTE — Telephone Encounter (Signed)
Patients wife called states he is having severe stomach pains with any diet for the past few days.

## 2020-08-19 NOTE — Telephone Encounter (Signed)
[place] for off coumadin x 5 days prior

## 2020-08-19 NOTE — Telephone Encounter (Signed)
Patient has been scheduled for an EGD with Dr. Bryan Lemma on 09/16/20.  Please advise if okay to hold Warfarin x 5 days prior?

## 2020-08-19 NOTE — Telephone Encounter (Signed)
Ordinarily I too would be ok with continuing the Coumadin through the peri-operative period for a lower risk procedure (ie, no polypectomy, esophageal dilation, etc). However, he has a hx of RYGB, and given c/o epigastric pain, if there are retained surgical staples, I would plan to remove at time of EGD, which has a slightly elevated bleeding risk. Similarly, if anastamotic stricture is source (less likely given complaints in EMR review), would plan on dilation. Therefore, please plan for request to hold Coumadin x5 days. Thanks.

## 2020-08-19 NOTE — Telephone Encounter (Signed)
Patient and his wife notified of plans and they agree with proceeding with Dr. Bryan Lemma performing EGD off Warfarin. Patient has been scheduled for EGD and pre-visit for 9/29 and 10/12.  He is advised he will be given instructions at the pre-visit on 9/29 and should not hold his Warfarin until instructed to do so.

## 2020-08-19 NOTE — Telephone Encounter (Signed)
Dr. Bryan Lemma, you are MD of the day.  Would you please review notes from Dr. Carlean Purl.  I plan to offer [name] a EGD with you.  Please advise if this should be on or off warfarin

## 2020-08-25 ENCOUNTER — Other Ambulatory Visit: Payer: Self-pay

## 2020-08-25 ENCOUNTER — Telehealth: Payer: Self-pay

## 2020-08-25 ENCOUNTER — Telehealth: Payer: Self-pay | Admitting: *Deleted

## 2020-08-25 NOTE — Telephone Encounter (Signed)
   Primary Cardiologist: Ena Dawley, MD  Chart reviewed as part of pre-operative protocol coverage. Patient was contacted 08/25/2020 in reference to pre-operative risk assessment for pending surgery as outlined below.  [name] was last seen on 04/09/20 by [name] Barrios PA-C with history of HTN, DM, moderate CAD by coronary CT managed medically, HLD, recurrent DVT/PEs, OSA, RBBB, obesity. I spoke with patient who affirms he is doing well without any new cardiac complaints. Therefore, based on ACC/AHA guidelines, the patient would be at acceptable risk for the planned procedure without further cardiovascular testing. The patient was advised that if he develops new symptoms prior to surgery to contact our office to arrange for a follow-up visit, and he verbalized understanding.  We are asked to weigh in on patient's Coumadin. He is on this for history of DVT/PE managed by his primary care, not through cardiology or for cardiac reasons, therefore would recommend GI team get in touch with PCP for clearance.  I will route this recommendation to the requesting party via Epic fax function and remove from pre-op pool. Please call with questions.  [name] Pitter, PA-C 08/25/2020, 3:35 PM

## 2020-08-25 NOTE — Telephone Encounter (Signed)
Dr.Cirigliano,  This patient has the ok to hold coumadin 5 days before EGD per TE on 08/18/2020. He had a colonoscopy on 08/06/2020 at that time he was on a Lovenox bridge. Does the patient need a Lovenox bridge for the EGD? Please advise. Thanks, Adanna Zuckerman PV

## 2020-08-25 NOTE — Telephone Encounter (Signed)
Yes, Coumadin hold with Lovenox bridge. Thanks.

## 2020-08-25 NOTE — Telephone Encounter (Signed)
[facility] Pre-operative Risk Assessment     Request for surgical clearance:     Endoscopy Procedure  What type of surgery is being performed?     EGD with DIL  When is this surgery scheduled?     09/16/20  What type of clearance is required ?   Pharmacy  Are there any medications that need to be held prior to surgery and how long? HOLD Coumadin five days prior [place] name and name of physician performing surgery?      [facility]/Dr. Bryan Lemma  What is your office phone and fax number?      Phone- [phone]  Fax- [phone] ATTN: Peter Congo, RMA  Anesthesia type (None, local, MAC, general) ?       MAC

## 2020-08-27 DIAGNOSIS — Z9884 Bariatric surgery status: Secondary | ICD-10-CM | POA: Diagnosis not present

## 2020-08-27 DIAGNOSIS — Z713 Dietary counseling and surveillance: Secondary | ICD-10-CM | POA: Diagnosis not present

## 2020-08-27 DIAGNOSIS — Z6836 Body mass index (BMI) 36.0-36.9, adult: Secondary | ICD-10-CM | POA: Diagnosis not present

## 2020-08-27 DIAGNOSIS — E669 Obesity, unspecified: Secondary | ICD-10-CM | POA: Diagnosis not present

## 2020-08-27 NOTE — Telephone Encounter (Signed)
See below , thanks

## 2020-08-27 NOTE — Telephone Encounter (Signed)
Ok for hold coumadin x 5 days prior with lovenox bridge  Jenny Reichmann-  can you authorize the bridging/ thanks

## 2020-09-01 ENCOUNTER — Other Ambulatory Visit: Payer: Self-pay | Admitting: General Practice

## 2020-09-01 ENCOUNTER — Telehealth: Payer: Self-pay | Admitting: General Practice

## 2020-09-01 DIAGNOSIS — Z7901 Long term (current) use of anticoagulants: Secondary | ICD-10-CM

## 2020-09-01 MED ORDER — ENOXAPARIN SODIUM 120 MG/0.8ML ~~LOC~~ SOLN: 120.0000 mg | Freq: Two times a day (BID) | SUBCUTANEOUS | 0 refills | Status: DC

## 2020-09-01 NOTE — Telephone Encounter (Signed)
I will follow up with patient today.  [name]

## 2020-09-01 NOTE — Telephone Encounter (Signed)
Instructions for warfarin and Lovenox pre and post procedure on 10/12.  10/7 - Last dose of warfarin until after procedure 10/8 - Nothing (No warfarin and No Lovenox) 10/9 - Lovenox in AM and PM (12 hours apart) 10/10 - Lovenox in the AM and PM 10/11 - Lovenox in the AM only 10/12 - procedure (Do not take Lovenox today) 10/13 - Lovenox in the AM and PM and take 1 1/2 tablets of warfarin 10/14 - Lovenox in the AM and PM and take 1 1/2 tablets of warfarin 10/15 - Lovenox in the AM and PM and take 1 1/2 tablets of warfarin 10/16 - Lovenox in the AM and PM and take 1 1/2 tablets of warfarin 10/17 - Stop Lovenox and resume taking 1 tablet of warfarin daily  10/19 - check INR

## 2020-09-03 ENCOUNTER — Other Ambulatory Visit: Payer: Self-pay

## 2020-09-03 ENCOUNTER — Encounter: Payer: Self-pay | Admitting: Gastroenterology

## 2020-09-03 ENCOUNTER — Ambulatory Visit (AMBULATORY_SURGERY_CENTER): Payer: PPO | Admitting: *Deleted

## 2020-09-03 VITALS — Ht 74.0 in | Wt 268.0 lb

## 2020-09-03 DIAGNOSIS — R1013 Epigastric pain: Secondary | ICD-10-CM

## 2020-09-03 NOTE — Telephone Encounter (Signed)
Spoke with patient this morning regarding holding Coumadin 5 days prior to procedure. Okay to hold five days prior per Dr. Jenny Reichmann. Patient verbalized understanding. Patient states he has not heard from their office regarding Lovenox bridge.

## 2020-09-03 NOTE — Progress Notes (Signed)
Pt completed covid vaccines 01-27-20  Pt is aware that care partner will wait in the car during procedure; if they feel like they will be too hot or cold to wait in the car; they may wait in the 4 th floor lobby. Patient is aware to bring only one care partner. We want them to wear a mask (we do not have any that we can provide them), practice social distancing, and we will check their temperatures when they get here.  I did remind the patient that their care partner needs to stay in the parking lot the entire time and have a cell phone available, we will call them when the pt is ready for discharge. Patient will wear mask into building.   No trouble with anesthesia, difficulty with intubation or hx/fam hx of malignant hyperthermia per pt   No egg or soy allergy  No home oxygen use   No medications for weight loss taken  Pt states the Coumadin Clinic has been in touch with him regarding his Coumadin and Lovenox.  I told him to follow their instructions and understanding voiced

## 2020-09-04 ENCOUNTER — Telehealth: Payer: Self-pay | Admitting: General Practice

## 2020-09-04 NOTE — Telephone Encounter (Signed)
Attempted to call patient.  [name].  Instructions for Lovenox bridge put in the mail on 9/30.

## 2020-09-08 NOTE — Telephone Encounter (Signed)
Hi Gloria,  I left messages 2 times last week for patient to call me.  I will try him again today but I dropped instructions in the mail to him last Thursday regarding the coumadin and Lovenox bridge.  I expect I will hear from him today.  Villa Herb, RN

## 2020-09-11 NOTE — Telephone Encounter (Signed)
Caren Griffins were you able to get in touch with patient? Thanks, Peter Congo, [place]

## 2020-09-11 NOTE — Telephone Encounter (Signed)
Hi [name]!  Yes,  I spoke with patient and he has the instructions.  Villa Herb, RN

## 2020-09-16 ENCOUNTER — Ambulatory Visit (AMBULATORY_SURGERY_CENTER): Payer: PPO | Admitting: Gastroenterology

## 2020-09-16 ENCOUNTER — Encounter: Payer: Self-pay | Admitting: Gastroenterology

## 2020-09-16 ENCOUNTER — Other Ambulatory Visit: Payer: Self-pay

## 2020-09-16 VITALS — BP 123/65 | HR 58 | Temp 97.1°F | Resp 19 | Ht 74.0 in | Wt 268.0 lb

## 2020-09-16 DIAGNOSIS — Z9884 Bariatric surgery status: Secondary | ICD-10-CM

## 2020-09-16 DIAGNOSIS — K3189 Other diseases of stomach and duodenum: Secondary | ICD-10-CM | POA: Diagnosis not present

## 2020-09-16 DIAGNOSIS — K319 Disease of stomach and duodenum, unspecified: Secondary | ICD-10-CM | POA: Diagnosis not present

## 2020-09-16 DIAGNOSIS — R109 Unspecified abdominal pain: Secondary | ICD-10-CM | POA: Diagnosis not present

## 2020-09-16 DIAGNOSIS — K2970 Gastritis, unspecified, without bleeding: Secondary | ICD-10-CM

## 2020-09-16 DIAGNOSIS — R1013 Epigastric pain: Secondary | ICD-10-CM

## 2020-09-16 MED ORDER — SODIUM CHLORIDE 0.9 % IV SOLN: 500.0000 mL | Freq: Once | INTRAVENOUS | Status: DC

## 2020-09-16 NOTE — Patient Instructions (Signed)
Please read handouts provided. Await pathology results. Continue present medications. Resume Lovenox today and Coumadin ( warfarin ) at prior dose tomorrow with transition per Coumadin clinic. Return to GI clinic at appointment to be scheduled.      YOU HAD AN ENDOSCOPIC PROCEDURE TODAY AT [facility]:   Refer to the procedure report that was given to you for any specific questions about what was found during the examination.  If the procedure report does not answer your questions, please call your gastroenterologist to clarify.  If you requested that your care partner not be given the details of your procedure findings, then the procedure report has been included in a sealed envelope for you to review at your convenience later.  YOU SHOULD EXPECT: Some feelings of bloating in the abdomen. Passage of more gas than usual.  Walking can help get rid of the air that was put into your GI tract during the procedure and reduce the bloating. If you had a lower endoscopy (such as a colonoscopy or flexible sigmoidoscopy) you may notice spotting of blood in your stool or on the toilet paper. If you underwent a bowel prep for your procedure, you may not have a normal bowel movement for a few days.  Please Note:  You might notice some irritation and congestion in your nose or some drainage.  This is from the oxygen used during your procedure.  There is no need for concern and it should clear up in a day or so.  SYMPTOMS TO REPORT IMMEDIATELY:    Following upper endoscopy (EGD)  Vomiting of blood or coffee ground material  New chest pain or pain under the shoulder blades  Painful or persistently difficult swallowing  New shortness of breath  Fever of 100F or higher  Black, tarry-looking stools  For urgent or emergent issues, a gastroenterologist can be reached at any hour by calling [phone]. Do not use MyChart messaging for urgent concerns.    DIET:  We do recommend a small  meal at first, but then you may proceed to your regular diet.  Drink plenty of fluids but you should avoid alcoholic beverages for 24 hours.  ACTIVITY:  You should plan to take it easy for the rest of today and you should NOT DRIVE or use heavy machinery until tomorrow (because of the sedation medicines used during the test).    FOLLOW UP: Our staff will call the number listed on your records 48-72 hours following your procedure to check on you and address any questions or concerns that you may have regarding the information given to you following your procedure. If we do not reach you, we will leave a message.  We will attempt to reach you two times.  During this call, we will ask if you have developed any symptoms of COVID 19. If you develop any symptoms (ie: fever, flu-like symptoms, shortness of breath, cough etc.) before then, please call [phone].  If you test positive for Covid 19 in the 2 weeks post procedure, please call and report this information to [place].    If any biopsies were taken you will be contacted by phone or by letter within the next 1-3 weeks.  Please call us at [phone] if you have not heard about the biopsies in 3 weeks.    SIGNATURES/CONFIDENTIALITY: You and/or your care partner have signed paperwork which will be entered into your electronic medical record.  These signatures attest to the fact that that the information  above on your After Visit Summary has been reviewed and is understood.  Full responsibility of the confidentiality of this discharge information lies with you and/or your care-partner. 

## 2020-09-16 NOTE — Op Note (Signed)
[place] Patient Name: [name] Procedure Date: 09/16/2020 9:39 AM MRN: [id number] Endoscopist: Gerrit Heck , MD Age: 64 Referring MD:  Date of Birth: [birth year] Gender: Male Account #: [id number] Procedure:                Upper GI endoscopy Indications:              Epigastric abdominal pain Medicines:                Monitored Anesthesia Care Procedure:                Pre-Anesthesia Assessment:                           - Prior to the procedure, a History and Physical                            was performed, and patient medications and                            allergies were reviewed. The patient's tolerance of                            previous anesthesia was also reviewed. The risks                            and benefits of the procedure and the sedation                            options and risks were discussed with the patient.                            All questions were answered, and informed consent                            was obtained. Prior Anticoagulants: The patient has                            taken Coumadin (warfarin), last dose was 5 days                            prior to procedure. ASA Grade Assessment: III - A                            patient with severe systemic disease. After                            reviewing the risks and benefits, the patient was                            deemed in satisfactory condition to undergo the                            procedure.  After obtaining informed consent, the endoscope was                            passed under direct vision. Throughout the                            procedure, the patient's blood pressure, pulse, and                            oxygen saturations were monitored continuously. The                            Endoscope was introduced through the mouth, and                            advanced to the jejunum. The upper GI endoscopy was                             accomplished without difficulty. The patient                            tolerated the procedure well. Scope In: Scope Out: Findings:                 The examined esophagus was normal.                           Evidence of a Roux-en-Y gastrojejunostomy was                            found. The gastrojejunal anastomosis was                            characterized by healthy appearing mucosa. This was                            traversed. There was a single surgical staple in                            place. There was otherwise no surrounding erythema,                            edema, or marginal ulceration. There was minimal                            gastritis in the gastric pouch. Biopsies were taken                            with a cold forceps for histology. Estimated blood                            loss was minimal.                           The examined jejunum was normal. The  blind limb was                            appropriately short and normal appearing mucosa. Complications:            No immediate complications. Estimated Blood Loss:     Estimated blood loss was minimal. Impression:               - Normal esophagus.                           - Roux-en-Y gastrojejunostomy with gastrojejunal                            anastomosis characterized by healthy appearing                            mucosa. Biopsied.                           - Normal examined jejunum. Recommendation:           - Patient has a contact number available for                            emergencies. The signs and symptoms of potential                            delayed complications were discussed with the                            patient. Return to normal activities tomorrow.                            Written discharge instructions were provided to the                            patient.                           - Resume previous diet.                           - Continue present  medications.                           - Await pathology results.                           - Resume Lovenox today and Coumadin (warfarin) at                            prior dose tomorrow with transition per Coumadin                            clinic.                           - Return to  GI clinic at appointment to be                            scheduled. Gerrit Heck, MD 09/16/2020 10:16:33 AM

## 2020-09-16 NOTE — Progress Notes (Signed)
A/ox3, pleased with MAC, report to RN 

## 2020-09-16 NOTE — Progress Notes (Signed)
Pt's states no medical or surgical changes since previsit or office visit. 

## 2020-09-18 ENCOUNTER — Telehealth: Payer: Self-pay

## 2020-09-18 NOTE — Telephone Encounter (Signed)
  Follow up Call-  Call back number 09/16/2020 08/06/2020  Post procedure Call Back phone  # [phone]  Permission to leave phone message Yes Yes  Some recent data might be hidden     Patient questions:  Do you have a fever, pain , or abdominal swelling? No. Pain Score  0 *  Have you tolerated food without any problems? Yes.    Have you been able to return to your normal activities? Yes.    Do you have any questions about your discharge instructions: Diet   No. Medications  No. Follow up visit  No.  Do you have questions or concerns about your Care? No.  Actions: * If pain score is 4 or above: No action needed, pain <4. 1. Have you developed a fever since your procedure? no  2.   Have you had an respiratory symptoms (SOB or cough) since your procedure? no  3.   Have you tested positive for COVID 19 since your procedure no  4.   Have you had any family members/close contacts diagnosed with the COVID 19 since your procedure?  no   If yes to any of these questions please route to Joylene John, RN and Joella Prince, RN

## 2020-09-20 ENCOUNTER — Encounter (HOSPITAL_COMMUNITY): Payer: Self-pay

## 2020-09-20 ENCOUNTER — Ambulatory Visit (HOSPITAL_COMMUNITY)
Admission: EM | Admit: 2020-09-20 | Discharge: 2020-09-20 | Disposition: A | Discharge status: Home / Self Care | Payer: PPO | Attending: Family Medicine | Admitting: Family Medicine

## 2020-09-20 ENCOUNTER — Other Ambulatory Visit: Payer: Self-pay

## 2020-09-20 DIAGNOSIS — R310 Gross hematuria: Secondary | ICD-10-CM | POA: Diagnosis not present

## 2020-09-20 DIAGNOSIS — R338 Other retention of urine: Secondary | ICD-10-CM

## 2020-09-20 DIAGNOSIS — Z7901 Long term (current) use of anticoagulants: Secondary | ICD-10-CM

## 2020-09-20 LAB — URINALYSIS, ROUTINE W REFLEX MICROSCOPIC
Bilirubin Urine: NEGATIVE
Glucose, UA: NEGATIVE mg/dL
Ketones, ur: NEGATIVE mg/dL
Leukocytes,Ua: NEGATIVE
Nitrite: NEGATIVE
Protein, ur: 100 mg/dL — AB
RBC / HPF: >50 RBC/hpf — ABNORMAL HIGH (ref 0–5)
Specific Gravity, Urine: 1.019 (ref 1.005–1.030)
pH: 5 (ref 5.0–8.0)

## 2020-09-20 MED ORDER — TAMSULOSIN HCL 0.4 MG PO CAPS
0.4000 mg | ORAL_CAPSULE | Freq: Every day | ORAL | 0 refills | Status: DC
Start: 2020-09-20 — End: 2021-02-16

## 2020-09-20 NOTE — ED Provider Notes (Signed)
[place]    CSN: [id number] Arrival date & time: 09/20/20  1035      History   Chief Complaint Chief Complaint  Patient presents with  . Hematuria    HPI [name] is a 64 y.o. male.   HPI  Patient with a complicated cardiovascular and GI medical history presents for evaluation of gross hematuria times today.  Patient awakened and urinated and reports visualizing nothing but blood with urination.  Patient is status post an endoscopy in which he had to DC Coumadin 24 h prior to procedure and he has been doing dual Lovenox and Coumadin over the course of the last 3 days.  Since his procedure his Coumadin dose has been increased to try to achieve a therapeutic level.  Patient also endorses taking an ASA 81 mg daily.  He reports that he thinks his doctor has been prescribed the 81 mg in addition to the Coumadin.  He has continued aspirin in addition to Coumadin and Lovenox.  He denies any dysuria he is having some mild left flank pain although pain is not reproducible.  He denies any nausea, vomiting, diarrhea or any abdominal pain.  Past Medical History:  Diagnosis Date  . Arthritis   . BACK PAIN   . CAD in native artery    a. moderate by cardiac CT 2016.  Marland Kitchen DEGENERATIVE JOINT DISEASE, KNEES, BILATERAL   . Depression   . Diabetes mellitus    type II  . DIVERTICULOSIS, COLON   . DVT, lower extremity ([place])    right  . Hyperlipidemia   . Hypertension   . HYPOGONADISM, MALE   . HYPOTENSION, ORTHOSTATIC   . Kidney stones   . Long term current use of anticoagulant   . Obesity   . Obstructive sleep apnea    pt does not use CPAP due to weight loss  . Pulmonary emboli ([place])   . Renal insufficiency    a. prior h/o, does not appear chronic.    Patient Active Problem List   Diagnosis Date Noted  . Dyspepsia 07/09/2020  . Left groin pain 07/09/2020  . Vitamin D deficiency 07/09/2020  . Chronic bilateral low back pain with bilateral sciatica 01/12/2019  .  Headache 12/11/2018  . AKI (acute kidney injury) ([place]) 12/11/2018  . Nosebleed 12/11/2018  . Maxillary sinus cyst 12/11/2018  . Left leg pain 12/11/2018  . Left hip pain 12/01/2018  . Acute upper respiratory infection 08/17/2018  . Increased prostate specific antigen (PSA) velocity 07/12/2018  . Pain in lower jaw 03/23/2018  . Pain of left heel 12/21/2017  . Long term (current) use of anticoagulants 08/17/2017  . RBBB 07/28/2017  . CAD (coronary artery disease) 06/28/2017  . Degenerative arthritis of right knee 07/15/2016  . Acute bursitis of right shoulder 07/15/2016  . Right shoulder pain 06/24/2016  . Right knee pain 06/24/2016  . Superficial phlebitis of arm 03/18/2016  . Abdominal pain 12/23/2015  . Rash 10/29/2015  . Chest pain 03/27/2015  . Dysuria 03/27/2015  . Diabetes ([place]) 10/01/2014  . Nipple pain 03/13/2014  . Breast mass in male 03/13/2014  . Encounter for therapeutic drug monitoring 01/25/2014  . Peripheral edema 10/10/2013  . OSA (obstructive sleep apnea) 09/11/2013  . Lesion of penis 01/01/2013  . Lumbosacral radiculopathy at S1 06/14/2012  . S/P left knee replacement 01/10/2012  . Preventative health care 12/16/2011  . Back pain with radiation 09/18/2011  . Long term current use of anticoagulant 04/08/2011  .  Pulmonary embolism ([place]) 02/05/2011  . HYPOTENSION, ORTHOSTATIC 01/29/2010  . CONSTIPATION 01/21/2010  . DEGENERATIVE JOINT DISEASE, KNEES, BILATERAL 11/17/2009  . SMOKER 09/12/2009  . HYPOGONADISM, MALE 03/04/2008  . OBESITY, MORBID 03/04/2008  . Depression 10/05/2007  . DIVERTICULOSIS, COLON 10/05/2007  . RENAL INSUFFICIENCY 10/05/2007  . COLONIC POLYPS, HX OF 10/05/2007  . Hyperlipidemia 06/30/2007  . Essential hypertension 06/30/2007    Past Surgical History:  Procedure Laterality Date  . ankle surgury  2001 bilateral   with bone spurs and torn tendon  . COLONOSCOPY  03/18/2010  . EDG  2008   chronic Duodenitis  . GASTRIC BYPASS    .  HEEL SPUR SURGERY Bilateral 2005  . knee surgury  2000 & 2003    cartilage damage  . LUMBAR LAMINECTOMY/DECOMPRESSION MICRODISCECTOMY  09/28/2012   Procedure: LUMBAR LAMINECTOMY/DECOMPRESSION MICRODISCECTOMY 2 LEVELS;  Surgeon: [name], MD;  Location: [facility];  Service: Orthopedics;  Laterality: Left;  Decompressive laminectomy L4-L5. Microdiscectomy L5-S1  . Skin removal surgery     extra abdominal skin removed from his weight loss  . TONSILLECTOMY AND ADENOIDECTOMY  age 69 or 5  . TOTAL KNEE ARTHROPLASTY  01/10/2012   Procedure: TOTAL KNEE ARTHROPLASTY;  Surgeon: [name], MD;  Location: [facility];  Service: Orthopedics;  Laterality: Left;  . UPPER GASTROINTESTINAL ENDOSCOPY  03/18/2010       Home Medications    Prior to Admission medications   Medication Sig Start Date End Date Taking? Authorizing Provider  Artificial Tear Ointment (ARTIFICIAL TEARS) ointment Place 1 drop into both eyes as needed (for dry eyes).   Yes [provider]  aspirin EC 81 MG tablet Take 81 mg by mouth daily.   Yes [provider]  atorvastatin (LIPITOR) 80 MG tablet Take 1 tablet (80 mg total) by mouth daily. 07/30/20  Yes Biagio Borg, MD  Calcium Carbonate (CALCI-CHEW PO) Take 1 tablet by mouth daily.    Yes [provider]  chlorthalidone (HYGROTON) 25 MG tablet TAKE 1 TABLET BY MOUTH EVERY DAY 07/23/20  Yes Biagio Borg, MD  Cholecalciferol (VITAMIN D3) 125 MCG (5000 UT) CAPS Take 1 capsule by mouth daily.   Yes [provider]  Cyanocobalamin (VITAMIN B-12 SL) Place 1 tablet under the tongue daily.   Yes [provider]  cyclobenzaprine (FLEXERIL) 5 MG tablet Take 1 tablet (5 mg total) by mouth 3 (three) times daily as needed for muscle spasms. 12/11/18  Yes Biagio Borg, MD  enoxaparin (LOVENOX) 120 MG/0.8ML injection Inject 0.8 mLs (120 mg total) into the skin every 12 (twelve) hours for 7 days. 09/13/20 09/20/20 Yes Biagio Borg, MD  escitalopram  (LEXAPRO) 10 MG tablet TAKE 1 TABLET BY MOUTH EVERY DAY 02/25/20  Yes Biagio Borg, MD  Ferrous Sulfate (IRON) 90 (18 Fe) MG TABS Take 18 mg by mouth daily.   Yes [provider]  fluticasone (FLONASE) 50 MCG/ACT nasal spray Place 2 sprays into both nostrils daily as needed for allergies. 11/28/18  Yes Rama, Venetia Maxon, MD  glipiZIDE (GLUCOTROL XL) 2.5 MG 24 hr tablet Take 1 tablet (2.5 mg total) by mouth daily with breakfast. 07/17/20  Yes Biagio Borg, MD  metFORMIN (GLUCOPHAGE) 500 MG tablet Take 4 tablets (2,000 mg total) by mouth daily with breakfast. 01/18/20  Yes Biagio Borg, MD  Multiple Vitamin (MULTIVITAMIN WITH MINERALS) TABS tablet Take 1 tablet by mouth daily.   Yes [provider]  nitroGLYCERIN (NITROSTAT) 0.4 MG  SL tablet Place 1 tablet (0.4 mg total) under the tongue every 5 (five) minutes as needed for chest pain. 07/30/15  Yes Dorothy Spark, MD  pantoprazole (PROTONIX) 40 MG tablet Take 1 tablet (40 mg total) by mouth daily. 07/09/20  Yes Biagio Borg, MD  pregabalin (LYRICA) 100 MG capsule Take 1 capsule (100 mg total) by mouth 2 (two) times daily. 04/15/20  Yes Suzzanne Cloud, NP  SENNA CO by Combination route. As needed for constipation.   Yes [provider]  SENNA-PLUS 8.6-50 MG tablet TAKE 1 TABLET BY MOUTH DAILY AS NEEDED FOR MILD CONSTIPATION. REPORTED ON 03/22/2016 07/05/19  Yes Biagio Borg, MD  warfarin (COUMADIN) 10 MG tablet TAKE 1 TABLET DAILY EXCEPT 2 TABLETS ON WED OR TAKE AS DIRECTED BY ANTICOAGULATION CLINIC 07/08/20  Yes Biagio Borg, MD  Diclofenac Sodium (PENNSAID) 2 % SOLN Place 1 application onto the skin 2 (two) times daily. Patient not taking: Reported on 08/06/2020 11/30/18   Rosemarie Ax, MD    Family History Family History  Problem Relation Age of Onset  . Hypertension Mother   . Prostate cancer Father   . Diabetes Sister        died with DM 2001-03-07, 2 more sisters with diabetes  . Heart failure Sister   . Diabetes  Brother   . Diabetes Sister   . Diabetes Sister   . Colon cancer Neg Hx   . Colon polyps Neg Hx   . Esophageal cancer Neg Hx   . Rectal cancer Neg Hx   . Stomach cancer Neg Hx     Social History Social History   Tobacco Use  . Smoking status: Former Smoker    Packs/day: 0.25    Years: 5.00    Pack years: 1.25    Quit date: 12/06/1978    Years since quitting: 41.8  . Smokeless tobacco: Never Used  Vaping Use  . Vaping Use: Never used  Substance Use Topics  . Alcohol use: Yes    Alcohol/week: 2.0 standard drinks    Types: 2 Standard drinks or equivalent per week  . Drug use: No     Allergies   Adhesive [tape] and Other  Review of Systems Review of Systems Pertinent negatives listed in HPI Physical Exam Triage Vital Signs ED Triage Vitals  Enc Vitals Group     BP 09/20/20 1224 124/79     Pulse Rate 09/20/20 1224 (!) 59     Resp 09/20/20 1224 16     Temp 09/20/20 1224 98.6 F (37 C)     Temp Source 09/20/20 1224 Oral     SpO2 09/20/20 1224 97 %     Weight --      Height --      Head Circumference --      Peak Flow --      Pain Score 09/20/20 1225 3     Pain Loc --      Pain Edu? --      Excl. in [place]? --    No data found.  Updated Vital Signs BP 124/79 (BP Location: Right Arm)   Pulse (!) 59   Temp 98.6 F (37 C) (Oral)   Resp 16   SpO2 97%   Visual Acuity Right Eye Distance:   Left Eye Distance:   Bilateral Distance:    Right Eye Near:   Left Eye Near:    Bilateral Near:     Physical Exam Constitutional: Patient appears  well-developed and well-nourished. No distress. HENT: Normocephalic, atraumatic, External right and left ear normal.  Eyes: Conjunctivae and EOM are normal. PERRLA, no scleral icterus. Neck: Normal ROM. Neck supple. No JVD. No tracheal deviation. No thyromegaly. CVS: RRR, S1/S2 +, no murmurs, no gallops, no carotid bruit.  Pulmonary: Effort and breath sounds normal, no stridor, rhonchi, wheezes, rales.  Abdominal: Soft. BS +,  no distension, tenderness, rebound or guarding.  Musculoskeletal: Normal range of motion. No edema and no tenderness.  Neuro: Alert. Normal reflexes, muscle tone coordination. No cranial nerve deficit. Skin: Skin is warm and excessively dry. No rash noted. Not diaphoretic. No erythema. No pallor. Psychiatric: Normal mood and affect. Behavior, judgment, thought content normal.   UC Treatments / Results  Labs (all labs ordered are listed, but only abnormal results are displayed) Labs Reviewed  CBC WITH DIFFERENTIAL/PLATELET  PROTIME-INR  COMPREHENSIVE METABOLIC PANEL  URINALYSIS, ROUTINE W REFLEX MICROSCOPIC  POCT URINALYSIS DIPSTICK, ED / UC    EKG   Radiology No results found.  Procedures Procedures (including critical care time)  Medications Ordered in UC Medications - No data to display  Initial Impression / Assessment and Plan / UC Course  I have reviewed the triage vital signs and the nursing notes.  Pertinent labs & imaging results that were available during my care of the patient were reviewed by me and considered in my medical decision making (see chart for details).   Unable to obtain in-house UA due to the gross viscosity of blood the machine would not read the urine.  UA is being sent to the hospital for a urinalysis microscopically.  Urine culture pending.  After multiple attempts by myself and nursing staff unable to complete orders for labs due to patient was a very difficult stick.  Suspect 1 of 3 differentials, bleeding is related to over anticoagulation with the increased dose of Coumadin/ASA 81 mg daily, UTI, or nephrolithiasis. Patient is advised to hold aspirin until he follows up with primary care.Treated based on symptoms with very strict precautions to return place patient on Flomax as he had had some episodes of retention while here in the clinic today.  Encouraged hydration to try and flush urine.  Patient advised if any symptoms worsen to go to the ER if  he is still having blood in urine advised to return here in the morning in an attempt to get blood work completed after he has had opportunity to hydrate.  Advised him to drink 5 large bottles of water or ten 8 ounce glasses of water.  Patient is stable for discharge vital signs are normal.  He is ambulatory a times alert and oriented x3. Final Clinical Impressions(s) / UC Diagnoses   Final diagnoses:  Gross hematuria  Anticoagulant long-term use  Acute retention of urine     Discharge Instructions     Hold aspirin today and tomorrow if you have already taken aspirin today do not take another aspirin tomorrow.  Continue Coumadin as prescribed.  Hydrate well with water today drinking a minimum of ten 8 ounce glasses.  I am also starting you on a dose of Flomax 0.4 mg take your first dose today hydrate well with water this should help increase and improve urine flow.  If you are still experiencing any gross amount of bleeding tomorrow I would like for you to return for evaluation.  Otherwise I will notify you once I receive your urinalysis from the lab next-door at the hospital.  If you start experiencing  any severe abdominal pain, low back pain, nausea, vomiting or fever or urine to be concentrated with only blood I would like for you to go immediately to the emergency department/  Also contact your primary care provider first thing on Monday morning as this requires additional work-up and you need to verify whether or not you are supposed to be taking an 81 mg aspirin.    ED Prescriptions    Medication Sig Dispense Auth. Provider   tamsulosin (FLOMAX) 0.4 MG CAPS capsule Take 1 capsule (0.4 mg total) by mouth daily after supper. 15 capsule Scot Jun, FNP     PDMP not reviewed this encounter.   Scot Jun, Lucas 09/21/20 [phone]

## 2020-09-20 NOTE — ED Triage Notes (Signed)
Pt presents today with hematuria that he noticed this morning. Has not never had this problem before. Does not have fever, chills, NVD, no other urinary sxs. Did state he had a recent endoscopy on 10/12. Does feel some discomfort in lower back. Only urinated one time today and states it was "quit a bit of blood"

## 2020-09-20 NOTE — Discharge Instructions (Addendum)
Hold aspirin today and tomorrow if you have already taken aspirin today do not take another aspirin tomorrow.  Continue Coumadin as prescribed.  Hydrate well with water today drinking a minimum of ten 8 ounce glasses.  I am also starting you on a dose of Flomax 0.4 mg take your first dose today hydrate well with water this should help increase and improve urine flow.  If you are still experiencing any gross amount of bleeding tomorrow I would like for you to return for evaluation.  Otherwise I will notify you once I receive your urinalysis from the lab next-door at the hospital.  If you start experiencing any severe abdominal pain, low back pain, nausea, vomiting or fever or urine to be concentrated with only blood I would like for you to go immediately to the emergency department/  Also contact your primary care provider first thing on Monday morning as this requires additional work-up and you need to verify whether or not you are supposed to be taking an 81 mg aspirin.

## 2020-09-20 NOTE — ED Notes (Signed)
UA was completed. Clintek machine was unable to read results due to quality of urine.

## 2020-09-20 NOTE — ED Triage Notes (Signed)
Pt also takes coumadin and aspirin daily.

## 2020-09-22 ENCOUNTER — Encounter: Payer: Self-pay | Admitting: Gastroenterology

## 2020-09-22 ENCOUNTER — Telehealth: Payer: Self-pay | Admitting: Internal Medicine

## 2020-09-22 NOTE — Telephone Encounter (Signed)
[facility] Report/Call: ---Caller states he is urinating blood this morning. Caller states he is feeling some discomfort in left flank.   See PCP within 4 hours. Patient went to ED on 10/16.

## 2020-09-22 NOTE — Telephone Encounter (Signed)
I dont see a question or request

## 2020-09-22 NOTE — Telephone Encounter (Signed)
Sent to Dr. John to advise. 

## 2020-09-23 ENCOUNTER — Ambulatory Visit (INDEPENDENT_AMBULATORY_CARE_PROVIDER_SITE_OTHER): Payer: PPO | Admitting: General Practice

## 2020-09-23 ENCOUNTER — Encounter: Payer: Self-pay | Admitting: Internal Medicine

## 2020-09-23 ENCOUNTER — Other Ambulatory Visit: Payer: Self-pay

## 2020-09-23 ENCOUNTER — Ambulatory Visit (INDEPENDENT_AMBULATORY_CARE_PROVIDER_SITE_OTHER): Payer: PPO | Admitting: Internal Medicine

## 2020-09-23 VITALS — BP 130/84 | HR 50 | Temp 97.9°F | Ht 74.0 in | Wt 265.0 lb

## 2020-09-23 DIAGNOSIS — R109 Unspecified abdominal pain: Secondary | ICD-10-CM | POA: Diagnosis not present

## 2020-09-23 DIAGNOSIS — N183 Chronic kidney disease, stage 3 unspecified: Secondary | ICD-10-CM

## 2020-09-23 DIAGNOSIS — R310 Gross hematuria: Secondary | ICD-10-CM | POA: Diagnosis not present

## 2020-09-23 DIAGNOSIS — E1122 Type 2 diabetes mellitus with diabetic chronic kidney disease: Secondary | ICD-10-CM | POA: Diagnosis not present

## 2020-09-23 DIAGNOSIS — E559 Vitamin D deficiency, unspecified: Secondary | ICD-10-CM | POA: Diagnosis not present

## 2020-09-23 DIAGNOSIS — I10 Essential (primary) hypertension: Secondary | ICD-10-CM | POA: Diagnosis not present

## 2020-09-23 DIAGNOSIS — R10A2 Flank pain, left side: Secondary | ICD-10-CM

## 2020-09-23 DIAGNOSIS — Z7901 Long term (current) use of anticoagulants: Secondary | ICD-10-CM

## 2020-09-23 DIAGNOSIS — E538 Deficiency of other specified B group vitamins: Secondary | ICD-10-CM

## 2020-09-23 LAB — VITAMIN D 25 HYDROXY (VIT D DEFICIENCY, FRACTURES): VITD: 34.9 ng/mL (ref 30.00–100.00)

## 2020-09-23 LAB — BASIC METABOLIC PANEL
BUN: 21 mg/dL (ref 6–23)
CO2: 28 mEq/L (ref 19–32)
Calcium: 9.6 mg/dL (ref 8.4–10.5)
Chloride: 99 mEq/L (ref 96–112)
Creatinine, Ser: 1.22 mg/dL (ref 0.40–1.50)
GFR: 61.07 mL/min (ref >60.00)
Glucose, Bld: 98 mg/dL (ref 70–99)
Potassium: 4.6 mEq/L (ref 3.5–5.1)
Sodium: 137 mEq/L (ref 135–145)

## 2020-09-23 LAB — POCT INR: INR: 1.8 — AB (ref 2.0–3.0)

## 2020-09-23 LAB — VITAMIN B12: Vitamin B-12: 1283 pg/mL — ABNORMAL HIGH (ref 211–911)

## 2020-09-23 NOTE — Patient Instructions (Signed)
Please continue all other medications as before, and refills have been done if requested.  Please have the pharmacy call with any other refills you may need.  Please continue your efforts at being more active, low cholesterol diet, and weight control.  You are otherwise up to date with prevention measures today.  Please keep your appointments with your specialists as you may have planned  You will be contacted regarding the referral for: CT scan for kidney stone, and the Urology  Please go to the LAB at the blood drawing area for the tests to be done - just the kidney testing today  You will be contacted by phone if any changes need to be made immediately.  Otherwise, you will receive a letter about your results with an explanation, but please check with MyChart first.  Please remember to sign up for MyChart if you have not done so, as this will be important to you in the future with finding out test results, communicating by private email, and scheduling acute appointments online when needed.

## 2020-09-23 NOTE — Progress Notes (Signed)
Medical screening examination/treatment/procedure(s) were performed by non-physician practitioner and as supervising physician I was immediately available for consultation/collaboration. I agree with above. Aaidyn San, MD   

## 2020-09-23 NOTE — Progress Notes (Signed)
Subjective:    Patient ID: [name], male    DOB: 1956/11/30, 64 y.o.   MRN: [id number]  HPI  Here to f/u after episode left flank pain and gross hematuria, pt was briefly on lovenox/coumadin/asa when woke with severe pain left flank and hematuria and frequency without fever, chills, n/v but with pain radiating to left groin and scrotal area; seen in ED oct 16 for the hematuria but the pain started just after he left, fortunately for him incidentally the pain is much improved and near resolved, as well as the frequency, did not notice passing a stone.  Currently on coumadin/asa and wondering why he is taking the asa, just kept it going as no one told him to stop.  Pt denies chest pain, increased sob or doe, wheezing, orthopnea, PND, increased LE swelling, palpitations, dizziness or syncope.  Pt denies new neurological symptoms such as new headache, or facial or extremity weakness or numbness   Pt denies polydipsia, polyuria Past Medical History:  Diagnosis Date  . Arthritis   . BACK PAIN   . CAD in native artery    a. moderate by cardiac CT 2016.  Marland Kitchen DEGENERATIVE JOINT DISEASE, KNEES, BILATERAL   . Depression   . Diabetes mellitus    type II  . DIVERTICULOSIS, COLON   . DVT, lower extremity ([place])    right  . Hyperlipidemia   . Hypertension   . HYPOGONADISM, MALE   . HYPOTENSION, ORTHOSTATIC   . Kidney stones   . Long term current use of anticoagulant   . Obesity   . Obstructive sleep apnea    pt does not use CPAP due to weight loss  . Pulmonary emboli ([place])   . Renal insufficiency    a. prior h/o, does not appear chronic.   Past Surgical History:  Procedure Laterality Date  . ankle surgury  2001 bilateral   with bone spurs and torn tendon  . COLONOSCOPY  03/18/2010  . EDG  2008   chronic Duodenitis  . GASTRIC BYPASS    . HEEL SPUR SURGERY Bilateral 2005  . knee surgury  2000 & 2003    cartilage damage  . LUMBAR LAMINECTOMY/DECOMPRESSION MICRODISCECTOMY  09/28/2012    Procedure: LUMBAR LAMINECTOMY/DECOMPRESSION MICRODISCECTOMY 2 LEVELS;  Surgeon: [name], MD;  Location: [facility];  Service: Orthopedics;  Laterality: Left;  Decompressive laminectomy L4-L5. Microdiscectomy L5-S1  . Skin removal surgery     extra abdominal skin removed from his weight loss  . TONSILLECTOMY AND ADENOIDECTOMY  age 36 or 13  . TOTAL KNEE ARTHROPLASTY  01/10/2012   Procedure: TOTAL KNEE ARTHROPLASTY;  Surgeon: [name], MD;  Location: [facility];  Service: Orthopedics;  Laterality: Left;  . UPPER GASTROINTESTINAL ENDOSCOPY  03/18/2010    reports that he quit smoking about 41 years ago. He has a 1.25 pack-year smoking history. He has never used smokeless tobacco. He reports current alcohol use of about 2.0 standard drinks of alcohol per week. He reports that he does not use drugs. family history includes Diabetes in his brother, sister, sister, and sister; Heart failure in his sister; Hypertension in his mother; Prostate cancer in his father. Allergies  Allergen Reactions  . Adhesive [Tape] Other (See Comments)    Painful, Please use "paper" tape  . Other     NO BLOOD -JEHOVAH'S WITNESS-SIGNED REFUSAL BUT WOULD TAKE ALBUMIN   Current Outpatient Medications on File Prior to Visit  Medication Sig Dispense Refill  . Artificial Tear  Ointment (ARTIFICIAL TEARS) ointment Place 1 drop into both eyes as needed (for dry eyes).    Marland Kitchen aspirin EC 81 MG tablet Take 81 mg by mouth daily.    Marland Kitchen atorvastatin (LIPITOR) 80 MG tablet Take 1 tablet (80 mg total) by mouth daily. 90 tablet 3  . Calcium Carbonate (CALCI-CHEW PO) Take 1 tablet by mouth daily.     . chlorthalidone (HYGROTON) 25 MG tablet TAKE 1 TABLET BY MOUTH EVERY DAY 90 tablet 1  . Cholecalciferol (VITAMIN D3) 125 MCG (5000 UT) CAPS Take 1 capsule by mouth daily.    . Cyanocobalamin (VITAMIN B-12 SL) Place 1 tablet under the tongue daily.    . cyclobenzaprine (FLEXERIL) 5 MG tablet Take 1 tablet (5 mg total) by mouth 3 (three)  times daily as needed for muscle spasms. 40 tablet 0  . Diclofenac Sodium (PENNSAID) 2 % SOLN Place 1 application onto the skin 2 (two) times daily. 1 Bottle 3  . escitalopram (LEXAPRO) 10 MG tablet TAKE 1 TABLET BY MOUTH EVERY DAY 90 tablet 3  . Ferrous Sulfate (IRON) 90 (18 Fe) MG TABS Take 18 mg by mouth daily.    . fluticasone (FLONASE) 50 MCG/ACT nasal spray Place 2 sprays into both nostrils daily as needed for allergies.    Marland Kitchen glipiZIDE (GLUCOTROL XL) 2.5 MG 24 hr tablet Take 1 tablet (2.5 mg total) by mouth daily with breakfast. 90 tablet 3  . metFORMIN (GLUCOPHAGE) 500 MG tablet Take 4 tablets (2,000 mg total) by mouth daily with breakfast. 360 tablet 3  . Multiple Vitamin (MULTIVITAMIN WITH MINERALS) TABS tablet Take 1 tablet by mouth daily.    . nitroGLYCERIN (NITROSTAT) 0.4 MG SL tablet Place 1 tablet (0.4 mg total) under the tongue every 5 (five) minutes as needed for chest pain. 90 tablet 3  . pantoprazole (PROTONIX) 40 MG tablet Take 1 tablet (40 mg total) by mouth daily. 30 tablet 11  . pregabalin (LYRICA) 100 MG capsule Take 1 capsule (100 mg total) by mouth 2 (two) times daily. 180 capsule 1  . SENNA CO by Combination route. As needed for constipation.    . SENNA-PLUS 8.6-50 MG tablet TAKE 1 TABLET BY MOUTH DAILY AS NEEDED FOR MILD CONSTIPATION. REPORTED ON 03/22/2016 90 tablet 1  . tamsulosin (FLOMAX) 0.4 MG CAPS capsule Take 1 capsule (0.4 mg total) by mouth daily after supper. 15 capsule 0  . warfarin (COUMADIN) 10 MG tablet TAKE 1 TABLET DAILY EXCEPT 2 TABLETS ON WED OR TAKE AS DIRECTED BY ANTICOAGULATION CLINIC 102 tablet 1  . enoxaparin (LOVENOX) 120 MG/0.8ML injection Inject 0.8 mLs (120 mg total) into the skin every 12 (twelve) hours for 7 days. 10.4 mL 0   No current facility-administered medications on file prior to visit.   Review of Systems All otherwise neg per pt    Objective:   Physical Exam BP 130/84 (BP Location: Left Arm, Patient Position: Sitting, Cuff Size:  Large)   Pulse (!) 50   Temp 97.9 F (36.6 C) (Oral)   Ht 6\' 2"  (1.88 m)   Wt 265 lb (120.2 kg)   SpO2 98%   BMI 34.02 kg/m  VS noted,  Constitutional: Pt appears in NAD HENT: Head: NCAT.  Right Ear: External ear normal.  Left Ear: External ear normal.  Eyes: . Pupils are equal, round, and reactive to light. Conjunctivae and EOM are normal Nose: without d/c or deformity Neck: Neck supple. Gross normal ROM Cardiovascular: Normal rate and regular rhythm.  Pulmonary/Chest: Effort normal and breath sounds without rales or wheezing.  Abd:  Soft, NT, ND, + BS, no organomegaly, no flank tender Neurological: Pt is alert. At baseline orientation, motor grossly intact Skin: Skin is warm. No rashes, other new lesions, no LE edema Psychiatric: Pt behavior is normal without agitation  .All otherwise neg per pt Lab Results  Component Value Date   WBC 6.7 01/15/2020   HGB 13.1 01/15/2020   HCT 42.3 01/15/2020   PLT 163.0 01/15/2020   GLUCOSE 98 09/23/2020   CHOL 137 07/09/2020   TRIG 103 07/09/2020   HDL 44 07/09/2020   LDLDIRECT 116.0 01/15/2020   LDLCALC 74 07/09/2020   ALT 21 07/09/2020   AST 17 07/09/2020   NA 137 09/23/2020   K 4.6 09/23/2020   CL 99 09/23/2020   CREATININE 1.22 09/23/2020   BUN 21 09/23/2020   CO2 28 09/23/2020   TSH 2.05 01/15/2020   PSA 2.92 01/15/2020   INR 1.8 (A) 09/23/2020   HGBA1C 7.5 (H) 07/09/2020   MICROALBUR 2.1 (H) 01/15/2020          Assessment & Plan:

## 2020-09-23 NOTE — Patient Instructions (Addendum)
Pre visit review using our clinic review tool, if applicable. No additional management support is needed unless otherwise documented below in the visit note.  Take 1 1/2 tablets today and then take 1 tablet daily except 1 1/2 tablets every Tuesday.  Re-check in 4 weeks.  Pt seeing Dr. Jenny Reichmann today about blood in his urine.

## 2020-09-27 ENCOUNTER — Other Ambulatory Visit: Payer: Self-pay | Admitting: Family Medicine

## 2020-09-28 ENCOUNTER — Encounter: Payer: Self-pay | Admitting: Internal Medicine

## 2020-09-28 DIAGNOSIS — R310 Gross hematuria: Secondary | ICD-10-CM | POA: Insufficient documentation

## 2020-09-28 NOTE — Assessment & Plan Note (Signed)
stable overall by history and exam, recent data reviewed with pt, and pt to continue medical treatment as before,  to f/u any worsening symptoms or concerns  

## 2020-09-28 NOTE — Assessment & Plan Note (Signed)
Cont oral replacement 

## 2020-09-28 NOTE — Assessment & Plan Note (Signed)
Ok continue coumadin,  Ok to stop the asa

## 2020-09-28 NOTE — Assessment & Plan Note (Addendum)
By hx has possibly passed a left renal stone, for bmp, ct urogram, and refer urology  I spent 41 minutes in preparing to see the patient by review of recent labs, imaging and procedures, obtaining and reviewing separately obtained history, communicating with the patient and family or caregiver, ordering medications, tests or procedures, and documenting clinical information in the EHR including the differential Dx, treatment, and any further evaluation and other management of left flank pain and gross hematuria, long term coumadin use, dm, htn, vit d def

## 2020-09-28 NOTE — Assessment & Plan Note (Signed)
As above likely renal stone related but will need urology eval

## 2020-10-07 ENCOUNTER — Other Ambulatory Visit: Payer: Self-pay

## 2020-10-07 ENCOUNTER — Ambulatory Visit (INDEPENDENT_AMBULATORY_CARE_PROVIDER_SITE_OTHER): Payer: PPO

## 2020-10-07 DIAGNOSIS — Z0000 Encounter for general adult medical examination without abnormal findings: Secondary | ICD-10-CM

## 2020-10-07 DIAGNOSIS — Z23 Encounter for immunization: Secondary | ICD-10-CM | POA: Diagnosis not present

## 2020-10-07 NOTE — Patient Instructions (Signed)
[name] , Thank you for taking time to come for your Medicare Wellness Visit. I appreciate your ongoing commitment to your health goals. Please review the following plan we discussed and let me know if I can assist you in the future.   Screening recommendations/referrals: Colonoscopy: 08/06/2020; due every 10 years Recommended yearly ophthalmology/optometry visit for glaucoma screening and checkup Recommended yearly dental visit for hygiene and checkup  Vaccinations: Influenza vaccine: 10/07/2020  Pneumococcal vaccine: up to date Tdap vaccine: 07/13/2019; due every 10 years Shingles vaccine: never done; will check with local pharmacy   Covid-19: up to date Zoster: 10/29/2015  Advanced directives: Advance directive discussed with you today. Even though you declined this today please call our office should you change your mind and we can give you the proper paperwork for you to fill out.  Conditions/risks identified: Yes; Reviewed health maintenance screenings with patient today and relevant education, vaccines, and/or referrals were provided. Please continue to do your personal lifestyle choices by: daily care of teeth and gums, regular physical activity (goal should be 5 days a week for 30 minutes), eat a healthy diet, avoid tobacco and drug use, limiting any alcohol intake, taking a low-dose aspirin (if not allergic or have been advised by your provider otherwise) and taking vitamins and minerals as recommended by your provider. Continue doing brain stimulating activities (puzzles, reading, adult coloring books, staying active) to keep memory sharp. Continue to eat heart healthy diet (full of fruits, vegetables, whole grains, lean protein, water--limit salt, fat, and sugar intake) and increase physical activity as tolerated.  Next appointment: Please schedule your next Medicare Wellness Visit with your Nurse Health Advisor in 1 year.  Preventive Care 21 Years and Older, Male Preventive care  refers to lifestyle choices and visits with your health care provider that can promote health and wellness. What does preventive care include?  A yearly physical exam. This is also called an annual well check.  Dental exams once or twice a year.  Routine eye exams. Ask your health care provider how often you should have your eyes checked.  Personal lifestyle choices, including:  Daily care of your teeth and gums.  Regular physical activity.  Eating a healthy diet.  Avoiding tobacco and drug use.  Limiting alcohol use.  Practicing safe sex.  Taking low doses of aspirin every day.  Taking vitamin and mineral supplements as recommended by your health care provider. What happens during an annual well check? The services and screenings done by your health care provider during your annual well check will depend on your age, overall health, lifestyle risk factors, and family history of disease. Counseling  Your health care provider may ask you questions about your:  Alcohol use.  Tobacco use.  Drug use.  Emotional well-being.  Home and relationship well-being.  Sexual activity.  Eating habits.  History of falls.  Memory and ability to understand (cognition).  Work and work Statistician. Screening  You may have the following tests or measurements:  Height, weight, and BMI.  Blood pressure.  Lipid and cholesterol levels. These may be checked every 5 years, or more frequently if you are over 49 years old.  Skin check.  Lung cancer screening. You may have this screening every year starting at age 38 if you have a 30-pack-year history of smoking and currently smoke or have quit within the past 15 years.  Fecal occult blood test (FOBT) of the stool. You may have this test every year starting at age 8.  Flexible sigmoidoscopy or colonoscopy. You may have a sigmoidoscopy every 5 years or a colonoscopy every 10 years starting at age 3.  Prostate cancer screening.  Recommendations will vary depending on your family history and other risks.  Hepatitis C blood test.  Hepatitis B blood test.  Sexually transmitted disease (STD) testing.  Diabetes screening. This is done by checking your blood sugar (glucose) after you have not eaten for a while (fasting). You may have this done every 1-3 years.  Abdominal aortic aneurysm (AAA) screening. You may need this if you are a current or former smoker.  Osteoporosis. You may be screened starting at age 46 if you are at high risk. Talk with your health care provider about your test results, treatment options, and if necessary, the need for more tests. Vaccines  Your health care provider may recommend certain vaccines, such as:  Influenza vaccine. This is recommended every year.  Tetanus, diphtheria, and acellular pertussis (Tdap, Td) vaccine. You may need a Td booster every 10 years.  Zoster vaccine. You may need this after age 69.  Pneumococcal 13-valent conjugate (PCV13) vaccine. One dose is recommended after age 31.  Pneumococcal polysaccharide (PPSV23) vaccine. One dose is recommended after age 54. Talk to your health care provider about which screenings and vaccines you need and how often you need them. This information is not intended to replace advice given to you by your health care provider. Make sure you discuss any questions you have with your health care provider. Document Released: 12/19/2015 Document Revised: 08/11/2016 Document Reviewed: 09/23/2015 Elsevier Interactive Patient Education  2017 [employer] Prevention in the Home Falls can cause injuries. They can happen to people of all ages. There are many things you can do to make your home safe and to help prevent falls. What can I do on the outside of my home?  Regularly fix the edges of walkways and driveways and fix any cracks.  Remove anything that might make you trip as you walk through a door, such as a raised step or  threshold.  Trim any bushes or trees on the path to your home.  Use bright outdoor lighting.  Clear any walking paths of anything that might make someone trip, such as rocks or tools.  Regularly check to see if handrails are loose or broken. Make sure that both sides of any steps have handrails.  Any raised decks and porches should have guardrails on the edges.  Have any leaves, snow, or ice cleared regularly.  Use sand or salt on walking paths during winter.  Clean up any spills in your garage right away. This includes oil or grease spills. What can I do in the bathroom?  Use night lights.  Install grab bars by the toilet and in the tub and shower. Do not use towel bars as grab bars.  Use non-skid mats or decals in the tub or shower.  If you need to sit down in the shower, use a plastic, non-slip stool.  Keep the floor dry. Clean up any water that spills on the floor as soon as it happens.  Remove soap buildup in the tub or shower regularly.  Attach bath mats securely with double-sided non-slip rug tape.  Do not have throw rugs and other things on the floor that can make you trip. What can I do in the bedroom?  Use night lights.  Make sure that you have a light by your bed that is easy to reach.  Do  not use any sheets or blankets that are too big for your bed. They should not hang down onto the floor.  Have a firm chair that has side arms. You can use this for support while you get dressed.  Do not have throw rugs and other things on the floor that can make you trip. What can I do in the kitchen?  Clean up any spills right away.  Avoid walking on wet floors.  Keep items that you use a lot in easy-to-reach places.  If you need to reach something above you, use a strong step stool that has a grab bar.  Keep electrical cords out of the way.  Do not use floor polish or wax that makes floors slippery. If you must use wax, use non-skid floor wax.  Do not have  throw rugs and other things on the floor that can make you trip. What can I do with my stairs?  Do not leave any items on the stairs.  Make sure that there are handrails on both sides of the stairs and use them. Fix handrails that are broken or loose. Make sure that handrails are as long as the stairways.  Check any carpeting to make sure that it is firmly attached to the stairs. Fix any carpet that is loose or worn.  Avoid having throw rugs at the top or bottom of the stairs. If you do have throw rugs, attach them to the floor with carpet tape.  Make sure that you have a light switch at the top of the stairs and the bottom of the stairs. If you do not have them, ask someone to add them for you. What else can I do to help prevent falls?  Wear shoes that:  Do not have high heels.  Have rubber bottoms.  Are comfortable and fit you well.  Are closed at the toe. Do not wear sandals.  If you use a stepladder:  Make sure that it is fully opened. Do not climb a closed stepladder.  Make sure that both sides of the stepladder are locked into place.  Ask someone to hold it for you, if possible.  Clearly mark and make sure that you can see:  Any grab bars or handrails.  First and last steps.  Where the edge of each step is.  Use tools that help you move around (mobility aids) if they are needed. These include:  Canes.  Walkers.  Scooters.  Crutches.  Turn on the lights when you go into a dark area. Replace any light bulbs as soon as they burn out.  Set up your furniture so you have a clear path. Avoid moving your furniture around.  If any of your floors are uneven, fix them.  If there are any pets around you, be aware of where they are.  Review your medicines with your doctor. Some medicines can make you feel dizzy. This can increase your chance of falling. Ask your doctor what other things that you can do to help prevent falls. This information is not intended to  replace advice given to you by your health care provider. Make sure you discuss any questions you have with your health care provider. Document Released: 09/18/2009 Document Revised: 04/29/2016 Document Reviewed: 12/27/2014 Elsevier Interactive Patient Education  2017 [employer].

## 2020-10-07 NOTE — Progress Notes (Signed)
Subjective:   [name] is a 64 y.o. male who presents for Medicare Annual/Subsequent preventive examination.  Review of Systems    No ROS. Medicare Wellness Visit. Additional risk factors are reflected in social history. Cardiac Risk Factors include: advanced age (>81men, >55 women);diabetes mellitus;dyslipidemia;family history of premature cardiovascular disease;hypertension;male gender;obesity (BMI >30kg/m2) Sleep Patterns: No sleep issues, feels rested on waking and sleeps 6-7 hours nightly. Home Safety/Smoke Alarms: Feels safe in home; uses home alarm. Smoke alarms in place. Living environment: 1-story home; Lives with spouse; no needs for DME; good family support system. Seat Belt Safety/Bike Helmet: Wears seat belt.    Objective:    Today's Vitals   10/07/20 1323 10/07/20 1351  BP: 118/81   Pulse: 61   Temp: 98.2 F (36.8 C)   SpO2: 96%   Weight: 269 lb 3.2 oz (122.1 kg)   Height: 6\' 2"  (1.88 m)   PainSc: 0-No pain 0-No pain   Body mass index is 34.56 kg/m.  Advanced Directives 10/07/2020 02/12/2020 10/02/2019 11/28/2018 11/27/2018 09/26/2018 09/23/2017  Does Patient Have a Medical Advance Directive? No No No No No No No  Does patient want to make changes to medical advance directive? - - - - - No - Patient declined -  Would patient like information on creating a medical advance directive? No - Patient declined No - Patient declined Yes (ED - Information included in AVS) - No - Patient declined - Yes (ED - Information included in AVS)  Pre-existing out of facility DNR order (yellow form or pink MOST form) - - - - - - -    Current Medications (verified) Outpatient Encounter Medications as of 10/07/2020  Medication Sig  . Artificial Tear Ointment (ARTIFICIAL TEARS) ointment Place 1 drop into both eyes as needed (for dry eyes).  Marland Kitchen aspirin EC 81 MG tablet Take 81 mg by mouth daily.  Marland Kitchen atorvastatin (LIPITOR) 80 MG tablet Take 1 tablet (80 mg total) by mouth daily.    . Calcium Carbonate (CALCI-CHEW PO) Take 1 tablet by mouth daily.   . chlorthalidone (HYGROTON) 25 MG tablet TAKE 1 TABLET BY MOUTH EVERY DAY  . Cholecalciferol (VITAMIN D3) 125 MCG (5000 UT) CAPS Take 1 capsule by mouth daily.  . Cyanocobalamin (VITAMIN B-12 SL) Place 1 tablet under the tongue daily.  . cyclobenzaprine (FLEXERIL) 5 MG tablet Take 1 tablet (5 mg total) by mouth 3 (three) times daily as needed for muscle spasms.  . Diclofenac Sodium (PENNSAID) 2 % SOLN Place 1 application onto the skin 2 (two) times daily.  Marland Kitchen enoxaparin (LOVENOX) 120 MG/0.8ML injection Inject 0.8 mLs (120 mg total) into the skin every 12 (twelve) hours for 7 days.  Marland Kitchen escitalopram (LEXAPRO) 10 MG tablet TAKE 1 TABLET BY MOUTH EVERY DAY  . Ferrous Sulfate (IRON) 90 (18 Fe) MG TABS Take 18 mg by mouth daily.  . fluticasone (FLONASE) 50 MCG/ACT nasal spray Place 2 sprays into both nostrils daily as needed for allergies.  Marland Kitchen glipiZIDE (GLUCOTROL XL) 2.5 MG 24 hr tablet Take 1 tablet (2.5 mg total) by mouth daily with breakfast.  . metFORMIN (GLUCOPHAGE) 500 MG tablet Take 4 tablets (2,000 mg total) by mouth daily with breakfast.  . Multiple Vitamin (MULTIVITAMIN WITH MINERALS) TABS tablet Take 1 tablet by mouth daily.  . nitroGLYCERIN (NITROSTAT) 0.4 MG SL tablet Place 1 tablet (0.4 mg total) under the tongue every 5 (five) minutes as needed for chest pain.  . pantoprazole (PROTONIX) 40 MG tablet Take  1 tablet (40 mg total) by mouth daily.  . pregabalin (LYRICA) 100 MG capsule Take 1 capsule (100 mg total) by mouth 2 (two) times daily.  . SENNA CO by Combination route. As needed for constipation.  . SENNA-PLUS 8.6-50 MG tablet TAKE 1 TABLET BY MOUTH DAILY AS NEEDED FOR MILD CONSTIPATION. REPORTED ON 03/22/2016  . tamsulosin (FLOMAX) 0.4 MG CAPS capsule Take 1 capsule (0.4 mg total) by mouth daily after supper.  . warfarin (COUMADIN) 10 MG tablet TAKE 1 TABLET DAILY EXCEPT 2 TABLETS ON WED OR TAKE AS DIRECTED BY  ANTICOAGULATION CLINIC   No facility-administered encounter medications on file as of 10/07/2020.    Allergies (verified) Adhesive [tape] and Other   History: Past Medical History:  Diagnosis Date  . Arthritis   . BACK PAIN   . CAD in native artery    a. moderate by cardiac CT 03/04/2015.  Marland Kitchen DEGENERATIVE JOINT DISEASE, KNEES, BILATERAL   . Depression   . Diabetes mellitus    type II  . DIVERTICULOSIS, COLON   . DVT, lower extremity ([place])    right  . Hyperlipidemia   . Hypertension   . HYPOGONADISM, MALE   . HYPOTENSION, ORTHOSTATIC   . Kidney stones   . Long term current use of anticoagulant   . Obesity   . Obstructive sleep apnea    pt does not use CPAP due to weight loss  . Pulmonary emboli ([place])   . Renal insufficiency    a. prior h/o, does not appear chronic.   Past Surgical History:  Procedure Laterality Date  . ankle surgury  2001 bilateral   with bone spurs and torn tendon  . COLONOSCOPY  03/18/2010  . EDG  03/04/07   chronic Duodenitis  . GASTRIC BYPASS    . HEEL SPUR SURGERY Bilateral 03-03-2004  . knee surgury  2000 & 03/03/2002    cartilage damage  . LUMBAR LAMINECTOMY/DECOMPRESSION MICRODISCECTOMY  09/28/2012   Procedure: LUMBAR LAMINECTOMY/DECOMPRESSION MICRODISCECTOMY 2 LEVELS;  Surgeon: [name], MD;  Location: [facility];  Service: Orthopedics;  Laterality: Left;  Decompressive laminectomy L4-L5. Microdiscectomy L5-S1  . Skin removal surgery     extra abdominal skin removed from his weight loss  . TONSILLECTOMY AND ADENOIDECTOMY  age 57 or 59  . TOTAL KNEE ARTHROPLASTY  01/10/2012   Procedure: TOTAL KNEE ARTHROPLASTY;  Surgeon: [name], MD;  Location: [facility];  Service: Orthopedics;  Laterality: Left;  . UPPER GASTROINTESTINAL ENDOSCOPY  03/18/2010   Family History  Problem Relation Age of Onset  . Hypertension Mother   . Prostate cancer Father   . Diabetes Sister        died with DM 03-03-01, 2 more sisters with diabetes  . Heart failure Sister   . Diabetes  Brother   . Diabetes Sister   . Diabetes Sister   . Colon cancer Neg Hx   . Colon polyps Neg Hx   . Esophageal cancer Neg Hx   . Rectal cancer Neg Hx   . Stomach cancer Neg Hx    Social History   Socioeconomic History  . Marital status: Married    Spouse name: Not on file  . Number of children: 2  . Years of education: Not on file  . Highest education level: High school graduate  Occupational History  . Occupation: Retired    Fish farm manager: [employer]  Tobacco Use  . Smoking status: Former Smoker    Packs/day: 0.25    Years: 5.00  Pack years: 1.25    Quit date: 12/06/1978    Years since quitting: 41.8  . Smokeless tobacco: Never Used  Vaping Use  . Vaping Use: Never used  Substance and Sexual Activity  . Alcohol use: Yes    Alcohol/week: 2.0 standard drinks    Types: 2 Standard drinks or equivalent per week  . Drug use: No  . Sexual activity: Not Currently  Other Topics Concern  . Not on file  Social History Narrative   Daily caffeine use one per day   Protein drinks    Children; a friend and sister that helps   Brother that is around [place])   Son and dtr in [place]   Lives w/ wife in [place]   Left handed     Retired from [employer]   Former smoker 2 alcoholic beverages daily no caffeine no drug use no other tobacco at this time   Social Determinants of Radio broadcast assistant Strain: Low Risk   . Difficulty of Paying Living Expenses: Not hard at all  Food Insecurity: No Food Insecurity  . Worried About Charity fundraiser in the Last Year: Never true  . Ran Out of Food in the Last Year: Never true  Transportation Needs: No Transportation Needs  . Lack of Transportation (Medical): No  . Lack of Transportation (Non-Medical): No  Physical Activity: Sufficiently Active  . Days of Exercise per Week: 5 days  . Minutes of Exercise per Session: 30 min  Stress: No Stress Concern Present  . Feeling of Stress : Not at all  Social Connections: Socially  Integrated  . Frequency of Communication with Friends and Family: More than three times a week  . Frequency of Social Gatherings with Friends and Family: More than three times a week  . Attends Religious Services: More than 4 times per year  . Active Member of Clubs or Organizations: Yes  . Attends Archivist Meetings: More than 4 times per year  . Marital Status: Married    Tobacco Counseling Counseling given: Not Answered   Clinical Intake:  Pre-visit preparation completed: Yes  Pain : No/denies pain Pain Score: 0-No pain     BMI - recorded: 34.56 Nutritional Status: BMI > 30  Obese Nutritional Risks: None Diabetes: Yes CBG done?: No Did pt. bring in CBG monitor from home?: No  How often do you need to have someone help you when you read instructions, pamphlets, or other written materials from your doctor or pharmacy?: 1 - Never What is the last grade level you completed in school?: HSG  Diabetic? yes  Interpreter Needed?: No  Information entered by :: Lisette Abu, LPN   Activities of Daily Living In your present state of health, do you have any difficulty performing the following activities: 10/07/2020  Hearing? N  Vision? N  Difficulty concentrating or making decisions? N  Walking or climbing stairs? N  Dressing or bathing? N  Doing errands, shopping? N  Preparing Food and eating ? N  Using the Toilet? N  In the past six months, have you accidently leaked urine? N  Do you have problems with loss of bowel control? N  Managing your Medications? N  Managing your Finances? N  Housekeeping or managing your Housekeeping? N  Some recent data might be hidden    Patient Care Team: Biagio Borg, MD as PCP - General Meda Coffee Jamse Belfast, MD as PCP - Cardiology (Cardiology) Suella Broad, MD as Consulting Physician (Physical Medicine  and Rehabilitation) Eldridge Abrahams, MD as Referring Physician (Surgical Oncology) Renato Shin, MD as Consulting  Physician (Endocrinology)  Indicate any recent Medical Services you may have received from other than [facility] providers in the past year (date may be approximate).     Assessment:   This is a routine wellness examination for Mannie.  Hearing/Vision screen No exam data present  Dietary issues and exercise activities discussed: Current Exercise Habits: Home exercise routine, Type of exercise: walking;Other - see comments (works on cars in garage, yard work & gardening), Time (Minutes): 30, Frequency (Times/Week): 5, Weekly Exercise (Minutes/Week): 150, Intensity: Moderate, Exercise limited by: None identified  Goals    . Be as healthy as possible and continue to lose weight     Continue stay active physically and mentally. Start to exercise routinely with my wife.       Depression Screen PHQ 2/9 Scores 10/07/2020 07/09/2020 01/15/2020 10/02/2019 01/12/2019 09/26/2018 12/21/2017  PHQ - 2 Score 0 0 0 0 1 0 0  PHQ- 9 Score - 0 - 0 - - -    Fall Risk Fall Risk  10/07/2020 01/15/2020 10/02/2019 01/12/2019 09/26/2018  Falls in the past year? 0 0 0 0 No  Number falls in past yr: 0 - 0 - -  Injury with Fall? 0 - 0 - -  Risk for fall due to : No Fall Risks - - - -  Follow up Falls evaluation completed;Education provided - - - -    Any stairs in or around the home? No  If so, are there any without handrails? No  Home free of loose throw rugs in walkways, pet beds, electrical cords, etc? Yes  Adequate lighting in your home to reduce risk of falls? Yes   ASSISTIVE DEVICES UTILIZED TO PREVENT FALLS:  Life alert? No  Use of a cane, walker or w/c? No  Grab bars in the bathroom? Yes  Shower chair or bench in shower? Yes  Elevated toilet seat or a handicapped toilet? Yes   TIMED UP AND GO:  Was the test performed? No .  Length of time to ambulate 10 feet: 0 sec.   Gait steady and fast without use of assistive device  Cognitive Function: MMSE - Mini Mental State Exam 09/23/2017 06/28/2016  Not  completed: - (No Data)  Orientation to time 5 -  Orientation to Place 5 -  Registration 3 -  Attention/ Calculation 5 -  Recall 2 -  Language- name 2 objects 2 -  Language- repeat 1 -  Language- follow 3 step command 3 -  Language- read & follow direction 1 -  Write a sentence 1 -  Copy design 1 -  Total score 29 -     6CIT Screen 10/07/2020  What Year? 0 points  What month? 0 points  What time? 0 points  Count back from 20 0 points  Months in reverse 0 points  Repeat phrase 0 points  Total Score 0    Immunizations Immunization History  Administered Date(s) Administered  . Fluad Quad(high Dose 65+) 10/05/2019  . Influenza Split 08/19/2011, 10/30/2012  . Influenza Whole 09/21/2005, 09/02/2008, 12/24/2009  . Influenza,inj,Quad PF,6+ Mos 09/13/2013, 09/30/2014, 10/29/2015, 11/15/2016, 09/23/2017, 09/19/2018  . PFIZER SARS-COV-2 Vaccination 01/03/2020, 01/27/2020  . Pneumococcal Conjugate-13 03/13/2014  . Pneumococcal Polysaccharide-23 03/04/2008, 06/28/2016, 11/28/2018  . Td 03/11/2009  . Tdap 07/13/2019  . Zoster 10/29/2015    TDAP status: Up to date Flu Vaccine status: Up to date Pneumococcal vaccine status: Up to  date Covid-19 vaccine status: Completed vaccines  Qualifies for Shingles Vaccine? Yes   Zostavax completed Yes   Shingrix Completed?: No.    Education has been provided regarding the importance of this vaccine. Patient has been advised to call insurance company to determine out of pocket expense if they have not yet received this vaccine. Advised may also receive vaccine at local pharmacy or Health Dept. Verbalized acceptance and understanding.  Screening Tests Health Maintenance  Topic Date Due  . FOOT EXAM  07/12/2020  . INFLUENZA VACCINE  10/09/2020 (Originally 07/06/2020)  . HEMOGLOBIN A1C  01/09/2021  . URINE MICROALBUMIN  01/14/2021  . OPHTHALMOLOGY EXAM  04/15/2021  . PNA vac Low Risk Adult (2 of 2 - PPSV23) 11/29/2023  . TETANUS/TDAP  07/12/2029   . COLONOSCOPY  08/06/2030  . COVID-19 Vaccine  Completed  . Hepatitis C Screening  Completed    Health Maintenance  Health Maintenance Due  Topic Date Due  . FOOT EXAM  07/12/2020    Colorectal cancer screening: Completed 08/06/2020. Repeat every 10 years  Lung Cancer Screening: (Low Dose CT Chest recommended if Age 83-80 years, 30 pack-year currently smoking OR have quit w/in 15years.) does not qualify.   Lung Cancer Screening Referral: no  Additional Screening:  Hepatitis C Screening: does qualify; Completed yes  Vision Screening: Recommended annual ophthalmology exams for early detection of glaucoma and other disorders of the eye. Is the patient up to date with their annual eye exam?  Yes  Who is the provider or what is the name of the office in which the patient attends annual eye exams? [facility] If pt is not established with a provider, would they like to be referred to a provider to establish care? No .   Dental Screening: Recommended annual dental exams for proper oral hygiene  Community Resource Referral / Chronic Care Management: CRR required this visit?  No   CCM required this visit?  No      Plan:     I have personally reviewed and noted the following in the patient's chart:   . Medical and social history . Use of alcohol, tobacco or illicit drugs  . Current medications and supplements . Functional ability and status . Nutritional status . Physical activity . Advanced directives . List of other physicians . Hospitalizations, surgeries, and ER visits in previous 12 months . Vitals . Screenings to include cognitive, depression, and falls . Referrals and appointments  In addition, I have reviewed and discussed with patient certain preventive protocols, quality metrics, and best practice recommendations. A written personalized care plan for preventive services as well as general preventive health recommendations were provided to patient.      Sheral Flow, LPN   17/05/1606   Nurse Notes: n/a

## 2020-10-08 ENCOUNTER — Ambulatory Visit
Admission: RE | Admit: 2020-10-08 | Discharge: 2020-10-08 | Disposition: A | Discharge status: Home / Self Care | Payer: PPO | Source: Ambulatory Visit | Attending: Internal Medicine | Admitting: Internal Medicine

## 2020-10-08 DIAGNOSIS — M47816 Spondylosis without myelopathy or radiculopathy, lumbar region: Secondary | ICD-10-CM | POA: Diagnosis not present

## 2020-10-08 DIAGNOSIS — R109 Unspecified abdominal pain: Secondary | ICD-10-CM | POA: Diagnosis not present

## 2020-10-08 DIAGNOSIS — I700 Atherosclerosis of aorta: Secondary | ICD-10-CM | POA: Diagnosis not present

## 2020-10-08 DIAGNOSIS — R319 Hematuria, unspecified: Secondary | ICD-10-CM | POA: Diagnosis not present

## 2020-10-09 ENCOUNTER — Encounter: Payer: Self-pay | Admitting: Internal Medicine

## 2020-10-21 ENCOUNTER — Ambulatory Visit: Payer: PPO

## 2020-10-23 ENCOUNTER — Other Ambulatory Visit: Payer: Self-pay

## 2020-10-23 ENCOUNTER — Ambulatory Visit (INDEPENDENT_AMBULATORY_CARE_PROVIDER_SITE_OTHER): Payer: PPO | Admitting: General Practice

## 2020-10-23 DIAGNOSIS — Z7901 Long term (current) use of anticoagulants: Secondary | ICD-10-CM

## 2020-10-23 LAB — POCT INR: INR: 2.1 (ref 2.0–3.0)

## 2020-10-23 NOTE — Patient Instructions (Addendum)
Pre visit review using our clinic review tool, if applicable. No additional management support is needed unless otherwise documented below in the visit note.  Take 1 1/2 tablets today and then take 1 tablet daily except 1 1/2 tablets every Tuesday.  Re-check in 4 weeks.

## 2020-10-23 NOTE — Progress Notes (Signed)
Medical screening examination/treatment/procedure(s) were performed by non-physician practitioner and as supervising physician I was immediately available for consultation/collaboration. I agree with above. Shanta Dorvil, MD   

## 2020-10-27 DIAGNOSIS — M25561 Pain in right knee: Secondary | ICD-10-CM | POA: Diagnosis not present

## 2020-10-27 DIAGNOSIS — M1711 Unilateral primary osteoarthritis, right knee: Secondary | ICD-10-CM | POA: Diagnosis not present

## 2020-10-29 DIAGNOSIS — M25572 Pain in left ankle and joints of left foot: Secondary | ICD-10-CM | POA: Diagnosis not present

## 2020-10-29 DIAGNOSIS — L84 Corns and callosities: Secondary | ICD-10-CM | POA: Diagnosis not present

## 2020-10-29 DIAGNOSIS — M79672 Pain in left foot: Secondary | ICD-10-CM | POA: Diagnosis not present

## 2020-11-04 ENCOUNTER — Telehealth: Payer: Self-pay | Admitting: General Practice

## 2020-11-04 NOTE — Telephone Encounter (Signed)
Instructions for warfarin and Lovenox pre and post knee replacement on 12/14.  12/9 - Last dose of warfarin until after surgery 12/10 - Nothing (No warfarin and No Lovenox) 12/11 - Lovenox in the AM and PM (12 hours apart) 12/12 - Lovenox in the AM and PM 12/13 - Lovenox in the AM only! (PLEASE TAKE BY 7 AM ) 12/14 - Procedure (No Lovenox today) 12/15 - Lovenox in the AM and PM and 1 1/2 tablets of warfarin (15 mg) 12/16 - Lovenox in the AM and PM and 1 1/2 tablets of warfarin (15 mg) 12/17 - Lovenox in the AM and PM and 1 1/2 tablets of warfarin (15 mg) 12/18 - Lovenox in the AM and PM and 1 1/2 tablets of warfarin (15 mg)  12/19- Stop Lovenox and continue warfarin 10 mg daily except 15 mg on Tuesdays and please have [facility] INR and report to [facility] @ [phone]. (May leave message on VM).

## 2020-11-05 ENCOUNTER — Other Ambulatory Visit: Payer: Self-pay | Admitting: General Practice

## 2020-11-05 ENCOUNTER — Encounter: Payer: Self-pay | Admitting: Internal Medicine

## 2020-11-05 DIAGNOSIS — Z7901 Long term (current) use of anticoagulants: Secondary | ICD-10-CM

## 2020-11-05 MED ORDER — ENOXAPARIN SODIUM 120 MG/0.8ML ~~LOC~~ SOLN: 120.0000 mg | Freq: Two times a day (BID) | SUBCUTANEOUS | 0 refills | Status: DC

## 2020-11-05 NOTE — Telephone Encounter (Signed)
Patient calling back asking about the blood thinners instructions.

## 2020-11-05 NOTE — Telephone Encounter (Signed)
-----   Message from Biagio Borg, MD sent at 11/04/2020  7:27 PM EST ----- Regarding: RE: Lovenox bridge Yes I agree with all ----- Message ----- From: Warden Fillers, RN Sent: 11/04/2020   3:43 PM EST To: Biagio Borg, MD Subject: Lovenox bridge                                 Dr. Jenny Reichmann,  Patient is scheduled for a knee replacement on 12/14.  Pt will need to hold warfarin for 5 days and will need a Lovenox bridge.  I will be happy to dose and order the Lovenox.  Do you approve?  Please advise.  Thanks! Villa Herb, RN

## 2020-11-05 NOTE — Telephone Encounter (Signed)
error 

## 2020-11-10 NOTE — Patient Instructions (Addendum)
DUE TO COVID-19 ONLY ONE VISITOR IS ALLOWED TO COME WITH YOU AND STAY IN THE WAITING ROOM ONLY DURING PRE OP AND PROCEDURE DAY OF SURGERY. THE 1 VISITOR  MAY VISIT WITH YOU AFTER SURGERY IN YOUR PRIVATE ROOM DURING VISITING HOURS ONLY!  YOU NEED TO HAVE A COVID 19 TEST ON__12/10_____ @_9 :40______, THIS TEST MUST BE DONE BEFORE SURGERY,  COVID TESTING SITE [place] [id number], IT IS ON THE RIGHT GOING OUT [address] APPROXIMATELY  2 MINUTES PAST [employer] ON THE RIGHT. ONCE YOUR COVID TEST IS COMPLETED,  PLEASE BEGIN THE QUARANTINE INSTRUCTIONS AS OUTLINED IN YOUR HANDOUT.                [name]   Your procedure is scheduled on: 11/18/20   Report to [facility] Main  Entrance   Report to admitting at   9:00 AM     Call this number if you have problems the morning of surgery [phone]    . BRUSH YOUR TEETH MORNING OF SURGERY AND RINSE YOUR MOUTH OUT, NO CHEWING GUM CANDY OR MINTS.   No food after midnight.    You may have clear liquid until 8:30 AM.    At 8:00 AM drink pre surgery drink  . Nothing by mouth after 8:30 AM.   Take these medicines the morning of surgery with A SIP OF WATER: Lexapro, Protonix  DO NOT TAKE ANY DIABETIC MEDICATIONS DAY OF YOUR SURGERY        How to Manage Your Diabetes Before and After Surgery  Why is it important to control my blood sugar before and after surgery? . Improving blood sugar levels before and after surgery helps healing and can limit problems. . A way of improving blood sugar control is eating a healthy diet by: o  Eating less sugar and carbohydrates o  Increasing activity/exercise o  Talking with your doctor about reaching your blood sugar goals . High blood sugars (greater than 180 mg/dL) can raise your risk of infections and slow your recovery, so you will need to focus on controlling your diabetes during the weeks before surgery. . Make sure that the doctor who takes care of  your diabetes knows about your planned surgery including the date and location.  How do I manage my blood sugar before surgery? . Check your blood sugar at least 4 times a day, starting 2 days before surgery, to make sure that the level is not too high or low. o Check your blood sugar the morning of your surgery when you wake up and every 2 hours until you get to the Short Stay unit. . If your blood sugar is less than 70 mg/dL, you will need to treat for low blood sugar: o Do not take insulin. o Treat a low blood sugar (less than 70 mg/dL) with  cup of clear juice (cranberry or apple), 4 glucose tablets, OR glucose gel. o Recheck blood sugar in 15 minutes after treatment (to make sure it is greater than 70 mg/dL). If your blood sugar is not greater than 70 mg/dL on recheck, call [phone] for further instructions. . Report your blood sugar to the short stay nurse when you get to Short Stay.  . If you are admitted to the hospital after surgery: o Your blood sugar will be checked by the staff and you will probably be given insulin after surgery (instead of oral diabetes medicines) to make sure you have good blood  sugar levels. o The goal for blood sugar control after surgery is 80-180 mg/dL.   WHAT DO I DO ABOUT MY DIABETES MEDICATION?  Marland Kitchen Do not take oral diabetes medicines (pills) the morning of surgery.                       You may not have any metal on your body including              piercings  Do not wear jewelry,  lotions, powders or deodorant              Men may shave face and neck.   Do not bring valuables to the hospital. [place].  Contacts, dentures or bridgework may not be worn into surgery.     Name and phone number of your driver:  Special Instructions: N/A              Please read over the following fact sheets you were given: _____________________________________________________________________              [facility] - Preparing for Surgery Before surgery, you can play an important role.   Because skin is not sterile, your skin needs to be as free of germs as possible.   You can reduce the number of germs on your skin by washing with CHG (chlorahexidine gluconate) soap before surgery.   CHG is an antiseptic cleaner which kills germs and bonds with the skin to continue killing germs even after washing. Please DO NOT use if you have an allergy to CHG or antibacterial soaps.  If your skin becomes reddened/irritated stop using the CHG and inform your nurse when you arrive at Short Stay.   You may shave your face/neck.  Please follow these instructions carefully:  1.  Shower with CHG Soap the night before surgery and the  morning of Surgery.  2.  If you choose to wash your hair, wash your hair first as usual with your  normal  shampoo.  3.  After you shampoo, rinse your hair and body thoroughly to remove the  shampoo.                                        4.  Use CHG as you would any other liquid soap.  You can apply chg directly  to the skin and wash                       Gently with a scrungie or clean washcloth.  5.  Apply the CHG Soap to your body ONLY FROM THE NECK DOWN.   Do not use on face/ open                           Wound or open sores. Avoid contact with eyes, ears mouth and genitals (private parts).                       Wash face,  Genitals (private parts) with your normal soap.             6.  Wash thoroughly, paying special attention to the area where your surgery  will be performed.  7.  Thoroughly rinse your body with warm water from the neck down.  8.  DO NOT shower/wash with your normal soap after using and rinsing off  the CHG Soap.             9.  Pat yourself dry with a clean towel.            10.  Wear clean pajamas.            11.  Place clean sheets on your bed the night of your first shower and do not  sleep with pets. Day of Surgery : Do not apply any  lotions/deodorants the morning of surgery.  Please wear clean clothes to the hospital/surgery center.  FAILURE TO FOLLOW THESE INSTRUCTIONS MAY RESULT IN THE CANCELLATION OF YOUR SURGERY PATIENT SIGNATURE_________________________________  NURSE SIGNATURE__________________________________   Incentive Spirometer  An incentive spirometer is a tool that can help keep your lungs clear and active. This tool measures how well you are filling your lungs with each breath. Taking long deep breaths may help reverse or decrease the chance of developing breathing (pulmonary) problems (especially infection) following:  A long period of time when you are unable to move or be active. BEFORE THE PROCEDURE   If the spirometer includes an indicator to show your best effort, your nurse or respiratory therapist will set it to a desired goal.  If possible, sit up straight or lean slightly forward. Try not to slouch.  Hold the incentive spirometer in an upright position. INSTRUCTIONS FOR USE  1. Sit on the edge of your bed if possible, or sit up as far as you can in bed or on a chair. 2. Hold the incentive spirometer in an upright position. 3. Breathe out normally. 4. Place the mouthpiece in your mouth and seal your lips tightly around it. 5. Breathe in slowly and as deeply as possible, raising the piston or the ball toward the top of the column. 6. Hold your breath for 3-5 seconds or for as long as possible. Allow the piston or ball to fall to the bottom of the column. 7. Remove the mouthpiece from your mouth and breathe out normally. 8. Rest for a few seconds and repeat Steps 1 through 7 at least 10 times every 1-2 hours when you are awake. Take your time and take a few normal breaths between deep breaths. 9. The spirometer may include an indicator to show your best effort. Use the indicator as a goal to work toward during each repetition. 10. After each set of 10 deep breaths, practice coughing to be sure  your lungs are clear. If you have an incision (the cut made at the time of surgery), support your incision when coughing by placing a pillow or rolled up towels firmly against it. Once you are able to get out of bed, walk around indoors and cough well. You may stop using the incentive spirometer when instructed by your caregiver.  RISKS AND COMPLICATIONS  Take your time so you do not get dizzy or light-headed.  If you are in pain, you may need to take or ask for pain medication before doing incentive spirometry. It is harder to take a deep breath if you are having pain. AFTER USE  Rest and breathe slowly and easily.  It can be helpful to keep track of a log of your progress. Your caregiver can provide you with a simple table to help with this. If you are using the spirometer at home, follow these  instructions: SEEK MEDICAL CARE IF:   You are having difficultly using the spirometer.  You have trouble using the spirometer as often as instructed.  Your pain medication is not giving enough relief while using the spirometer.  You develop fever of 100.5 F (38.1 C) or higher. SEEK IMMEDIATE MEDICAL CARE IF:   You cough up bloody sputum that had not been present before.  You develop fever of 102 F (38.9 C) or greater.  You develop worsening pain at or near the incision site. MAKE SURE YOU:   Understand these instructions.  Will watch your condition.  Will get help right away if you are not doing well or get worse. Document Released: 04/04/2007 Document Revised: 02/14/2012 Document Reviewed: 06/05/2007 [facility] Patient Information 2014 [employer], [place].   ________________________________________________________________________  ________________________________________________________________________

## 2020-11-11 ENCOUNTER — Encounter (HOSPITAL_COMMUNITY)
Admission: RE | Admit: 2020-11-11 | Discharge: 2020-11-11 | Disposition: A | Discharge status: Home / Self Care | Payer: PPO | Source: Ambulatory Visit | Attending: Orthopedic Surgery | Admitting: Orthopedic Surgery

## 2020-11-11 ENCOUNTER — Encounter (HOSPITAL_COMMUNITY): Payer: Self-pay

## 2020-11-11 ENCOUNTER — Other Ambulatory Visit: Payer: Self-pay

## 2020-11-11 DIAGNOSIS — Z01812 Encounter for preprocedural laboratory examination: Secondary | ICD-10-CM | POA: Insufficient documentation

## 2020-11-11 HISTORY — DX: Personal history of urinary calculi: Z87.442

## 2020-11-11 LAB — CBC
HCT: 42.3 % (ref 39.0–52.0)
Hemoglobin: 13 g/dL (ref 13.0–17.0)
MCH: 22.1 pg — ABNORMAL LOW (ref 26.0–34.0)
MCHC: 30.7 g/dL (ref 30.0–36.0)
MCV: 72.1 fL — ABNORMAL LOW (ref 80.0–100.0)
Platelets: 226 10*3/uL (ref 150–400)
RBC: 5.87 MIL/uL — ABNORMAL HIGH (ref 4.22–5.81)
RDW: 15.5 % (ref 11.5–15.5)
WBC: 7.5 10*3/uL (ref 4.0–10.5)
nRBC: 0 % (ref 0.0–0.2)

## 2020-11-11 LAB — SURGICAL PCR SCREEN
MRSA, PCR: NEGATIVE
Staphylococcus aureus: POSITIVE — AB

## 2020-11-11 LAB — COMPREHENSIVE METABOLIC PANEL
ALT: 23 U/L (ref 0–44)
AST: 20 U/L (ref 15–41)
Albumin: 4.3 g/dL (ref 3.5–5.0)
Alkaline Phosphatase: 59 U/L (ref 38–126)
Anion gap: 10 (ref 5–15)
BUN: 24 mg/dL — ABNORMAL HIGH (ref 8–23)
CO2: 29 mmol/L (ref 22–32)
Calcium: 9.3 mg/dL (ref 8.9–10.3)
Chloride: 99 mmol/L (ref 98–111)
Creatinine, Ser: 1.25 mg/dL — ABNORMAL HIGH (ref 0.61–1.24)
GFR, Estimated: >60 mL/min (ref >60)
Glucose, Bld: 139 mg/dL — ABNORMAL HIGH (ref 70–99)
Potassium: 4.5 mmol/L (ref 3.5–5.1)
Sodium: 138 mmol/L (ref 135–145)
Total Bilirubin: 0.6 mg/dL (ref 0.3–1.2)
Total Protein: 7.3 g/dL (ref 6.5–8.1)

## 2020-11-11 LAB — HEMOGLOBIN A1C
Hgb A1c MFr Bld: 6.9 % — ABNORMAL HIGH (ref 4.8–5.6)
Mean Plasma Glucose: 151.33 mg/dL

## 2020-11-11 NOTE — Telephone Encounter (Signed)
Attempted to reach patient.  Left messages of both home VM and Mobile VM.

## 2020-11-11 NOTE — Progress Notes (Signed)
COVID Vaccine Completed:Yes Date COVID Vaccine completed:01/27/20-Booster 10/23/20 COVID vaccine manufacturer: Pfizer      PCP - Dr. Maudry Mayhew Cardiologist - Dr. Liane Comber  Chest x-ray - no EKG - 04/09/20-Epic Stress Test - 2016-Epic ECHO - 11/28/18-epic Cardiac Cath - no Pacemaker/ICD device last checked:NA  Sleep Study - yes CPAP - no- no longer needs it due to a large weight loss  Fasting Blood Sugar - 120-140 Checks Blood Sugar _____ times a day. 1-2 times a week  Blood Thinner Instructions:Coumadin with lovenox bridge/ Dr. Jenny Reichmann Aspirin Instructions:Stop coumadin on 12/7 and start lovenox shots 12/8 until 1 day prior to DOS Last Dose:12/7  Anesthesia review:   Patient denies shortness of breath, fever, cough and chest pain at PAT appointment yes  Patient verbalized understanding of instructions that were given to them at the PAT appointment. Patient was also instructed that they will need to review over the PAT instructions again at home before surgery.yes Pt has no SOB with any activities. He is [facility] and refuses blood products

## 2020-11-13 ENCOUNTER — Other Ambulatory Visit: Payer: Self-pay | Admitting: General Practice

## 2020-11-13 DIAGNOSIS — Z7901 Long term (current) use of anticoagulants: Secondary | ICD-10-CM

## 2020-11-13 MED ORDER — ENOXAPARIN SODIUM 120 MG/0.8ML ~~LOC~~ SOLN: 120.0000 mg | Freq: Two times a day (BID) | SUBCUTANEOUS | 0 refills | Status: DC

## 2020-11-14 ENCOUNTER — Other Ambulatory Visit (HOSPITAL_COMMUNITY)
Admission: RE | Admit: 2020-11-14 | Discharge: 2020-11-14 | Disposition: A | Discharge status: Home / Self Care | Payer: PPO | Source: Ambulatory Visit | Attending: Orthopedic Surgery | Admitting: Orthopedic Surgery

## 2020-11-14 DIAGNOSIS — Z20822 Contact with and (suspected) exposure to covid-19: Secondary | ICD-10-CM | POA: Insufficient documentation

## 2020-11-14 DIAGNOSIS — Z01812 Encounter for preprocedural laboratory examination: Secondary | ICD-10-CM | POA: Diagnosis not present

## 2020-11-14 LAB — SARS CORONAVIRUS 2 (TAT 6-24 HRS): SARS Coronavirus 2: NEGATIVE

## 2020-11-16 NOTE — H&P (Signed)
TOTAL KNEE ADMISSION H&P  Patient is being admitted for right total knee arthroplasty.  Subjective:  Chief Complaint:  Right knee OA / pain  HPI: [name], 64 y.o. male, has a history of pain and functional disability in the right knee due to arthritis and has failed non-surgical conservative treatments for greater than 12 weeks to include NSAID's and/or analgesics, corticosteriod injections and activity modification.  Onset of symptoms was gradual, starting 3 years ago with gradually worsening course since that time. The patient noted prior procedures on the knee to include  arthroscopy on the right knee(s).  Patient currently rates pain in the right knee(s) at 10 out of 10 with activity. Patient has night pain, worsening of pain with activity and weight bearing, pain that interferes with activities of daily living, pain with passive range of motion, crepitus and joint swelling.  Patient has evidence of periarticular osteophytes and joint space narrowing by imaging studies. There is no active infection.  Risks, benefits and expectations were discussed with the patient.  Risks including but not limited to the risk of anesthesia, blood clots, nerve damage, blood vessel damage, failure of the prosthesis, infection and up to and including death.  Patient understand the risks, benefits and expectations and wishes to proceed with surgery.   D/C Plans:       Home   Post-op Meds:       No Rx given   Tranexamic Acid:      To be given - IV   Decadron:      Is to be given  FYI:      ASA  Norco  DME:    Rx sent for - RW & 3-N-1  PT:   OPPT    Pharmacy: [facility] [place].    Patient Active Problem List   Diagnosis Date Noted  . Gross hematuria 09/28/2020  . Left flank pain 09/23/2020  . Dyspepsia 07/09/2020  . Left groin pain 07/09/2020  . Vitamin D deficiency 07/09/2020  . Chronic bilateral low back pain with bilateral sciatica 01/12/2019  . Headache 12/11/2018  . AKI (acute kidney  injury) ([place]) 12/11/2018  . Nosebleed 12/11/2018  . Maxillary sinus cyst 12/11/2018  . Left leg pain 12/11/2018  . Left hip pain 12/01/2018  . Acute upper respiratory infection 08/17/2018  . Increased prostate specific antigen (PSA) velocity 07/12/2018  . Pain in lower jaw 03/23/2018  . Pain of left heel 12/21/2017  . Long term (current) use of anticoagulants 08/17/2017  . RBBB 07/28/2017  . CAD (coronary artery disease) 06/28/2017  . Degenerative arthritis of right knee 07/15/2016  . Acute bursitis of right shoulder 07/15/2016  . Right shoulder pain 06/24/2016  . Right knee pain 06/24/2016  . Superficial phlebitis of arm 03/18/2016  . Abdominal pain 12/23/2015  . Rash 10/29/2015  . Chest pain 03/27/2015  . Dysuria 03/27/2015  . Diabetes ([place]) 10/01/2014  . Nipple pain 03/13/2014  . Breast mass in male 03/13/2014  . Encounter for therapeutic drug monitoring 01/25/2014  . Peripheral edema 10/10/2013  . OSA (obstructive sleep apnea) 09/11/2013  . Lesion of penis 01/01/2013  . Lumbosacral radiculopathy at S1 06/14/2012  . S/P left knee replacement 01/10/2012  . Preventative health care 12/16/2011  . Back pain with radiation 09/18/2011  . Long term current use of anticoagulant 04/08/2011  . Pulmonary embolism ([place]) 02/05/2011  . HYPOTENSION, ORTHOSTATIC 01/29/2010  . CONSTIPATION 01/21/2010  . DEGENERATIVE JOINT DISEASE, KNEES, BILATERAL 11/17/2009  . SMOKER 09/12/2009  .  HYPOGONADISM, MALE 03/04/2008  . OBESITY, MORBID 03/04/2008  . Depression 10/05/2007  . DIVERTICULOSIS, COLON 10/05/2007  . RENAL INSUFFICIENCY 10/05/2007  . COLONIC POLYPS, HX OF 10/05/2007  . Hyperlipidemia 06/30/2007  . Essential hypertension 06/30/2007   Past Medical History:  Diagnosis Date  . Arthritis   . BACK PAIN   . CAD in native artery    a. moderate by cardiac CT 2016.  Marland Kitchen DEGENERATIVE JOINT DISEASE, KNEES, BILATERAL   . Depression   . Diabetes mellitus    type II  . DIVERTICULOSIS,  COLON   . DVT, lower extremity ([place])    right  . History of kidney stones   . Hyperlipidemia   . Hypertension   . HYPOGONADISM, MALE   . HYPOTENSION, ORTHOSTATIC   . Kidney stones   . Long term current use of anticoagulant   . Obesity   . Pulmonary emboli ([place])   . Renal insufficiency    a. prior h/o, does not appear chronic.    Past Surgical History:  Procedure Laterality Date  . ankle surgury  2001 bilateral   with bone spurs and torn tendon  . COLONOSCOPY  03/18/2010  . EDG  2008   chronic Duodenitis  . GASTRIC BYPASS    . HEEL SPUR SURGERY Bilateral 2005  . knee surgury  2000 & 2003    cartilage damage  . LUMBAR LAMINECTOMY/DECOMPRESSION MICRODISCECTOMY  09/28/2012   Procedure: LUMBAR LAMINECTOMY/DECOMPRESSION MICRODISCECTOMY 2 LEVELS;  Surgeon: [name], MD;  Location: [facility];  Service: Orthopedics;  Laterality: Left;  Decompressive laminectomy L4-L5. Microdiscectomy L5-S1  . Skin removal surgery     extra abdominal skin removed from his weight loss  . TONSILLECTOMY AND ADENOIDECTOMY  age 60 or 17  . TOTAL KNEE ARTHROPLASTY  01/10/2012   Procedure: TOTAL KNEE ARTHROPLASTY;  Surgeon: [name], MD;  Location: [facility];  Service: Orthopedics;  Laterality: Left;  . UPPER GASTROINTESTINAL ENDOSCOPY  03/18/2010    No current facility-administered medications for this encounter.   Current Outpatient Medications  Medication Sig Dispense Refill Last Dose  . Artificial Tear Ointment (ARTIFICIAL TEARS) ointment Place 1 drop into both eyes every 4 (four) hours as needed (dry eyes).      Marland Kitchen atorvastatin (LIPITOR) 80 MG tablet Take 1 tablet (80 mg total) by mouth daily. (Patient taking differently: Take 80 mg by mouth daily with supper. ) 90 tablet 3   . Calcium Carbonate (CALCI-CHEW PO) Take 1 tablet by mouth daily.      . chlorthalidone (HYGROTON) 25 MG tablet TAKE 1 TABLET BY MOUTH EVERY DAY (Patient taking differently: Take 25 mg by mouth daily. ) 90 tablet 1   .  Cholecalciferol (VITAMIN D3) 125 MCG (5000 UT) CAPS Take 5,000 Units by mouth daily.      . Cyanocobalamin (VITAMIN B-12 SL) Place 1 tablet under the tongue daily.     Marland Kitchen escitalopram (LEXAPRO) 10 MG tablet TAKE 1 TABLET BY MOUTH EVERY DAY (Patient taking differently: Take 10 mg by mouth daily. ) 90 tablet 3   . Ferrous Sulfate (IRON PO) Take 1 tablet by mouth daily with breakfast.     . fluticasone (FLONASE) 50 MCG/ACT nasal spray Place 2 sprays into both nostrils daily as needed for allergies.     Marland Kitchen glipiZIDE (GLUCOTROL XL) 2.5 MG 24 hr tablet Take 1 tablet (2.5 mg total) by mouth daily with breakfast. 90 tablet 3   . metFORMIN (GLUCOPHAGE) 500 MG tablet Take 4 tablets (2,000 mg  total) by mouth daily with breakfast. (Patient taking differently: Take 1,000 mg by mouth daily with breakfast. ) 360 tablet 3   . Multiple Vitamin (MULTIVITAMIN WITH MINERALS) TABS tablet Take 1 tablet by mouth daily.     . nitroGLYCERIN (NITROSTAT) 0.4 MG SL tablet Place 1 tablet (0.4 mg total) under the tongue every 5 (five) minutes as needed for chest pain. 90 tablet 3   . pantoprazole (PROTONIX) 40 MG tablet Take 1 tablet (40 mg total) by mouth daily. 30 tablet 11   . SENNA CO Take 1 tablet by mouth daily. As needed for constipation.      Marland Kitchen warfarin (COUMADIN) 10 MG tablet TAKE 1 TABLET DAILY EXCEPT 2 TABLETS ON WED OR TAKE AS DIRECTED BY ANTICOAGULATION CLINIC (Patient taking differently: Take 10-15 mg by mouth See admin instructions. Take 10mg  (1 tablet) daily except on Tuesdays take 15mg  (1.5 tablets) OR TAKE AS DIRECTED BY ANTICOAGULATION CLINIC) 102 tablet 1   . enoxaparin (LOVENOX) 120 MG/0.8ML injection Inject 0.8 mLs (120 mg total) into the skin every 12 (twelve) hours for 2 doses. 1.6 mL 0   . tamsulosin (FLOMAX) 0.4 MG CAPS capsule Take 1 capsule (0.4 mg total) by mouth daily after supper. (Patient not taking: Reported on 11/05/2020) 15 capsule 0 Completed Course at Unknown time   Allergies  Allergen Reactions   . Adhesive [Tape] Other (See Comments)    Painful, Please use "paper" tape  . Other     NO BLOOD -JEHOVAH'S WITNESS-SIGNED REFUSAL BUT WOULD TAKE ALBUMIN    Social History   Tobacco Use  . Smoking status: Former Smoker    Packs/day: 0.25    Years: 5.00    Pack years: 1.25    Quit date: 12/06/1978    Years since quitting: 41.9  . Smokeless tobacco: Never Used  Substance Use Topics  . Alcohol use: Yes    Alcohol/week: 2.0 standard drinks    Types: 2 Standard drinks or equivalent per week    Family History  Problem Relation Age of Onset  . Hypertension Mother   . Prostate cancer Father   . Diabetes Sister        died with DM 2001/03/22, 2 more sisters with diabetes  . Heart failure Sister   . Diabetes Brother   . Diabetes Sister   . Diabetes Sister   . Colon cancer Neg Hx   . Colon polyps Neg Hx   . Esophageal cancer Neg Hx   . Rectal cancer Neg Hx   . Stomach cancer Neg Hx      Review of Systems  Constitutional: Negative.   HENT: Negative.   Eyes: Negative.   Respiratory: Negative.   Cardiovascular: Negative.   Gastrointestinal: Positive for heartburn.  Genitourinary: Negative.   Musculoskeletal: Positive for joint pain.  Skin: Negative.   Neurological: Negative.   Endo/Heme/Allergies: Negative.   Psychiatric/Behavioral: Negative.      Objective:  Physical Exam Constitutional:      Appearance: He is well-developed.  HENT:     Head: Normocephalic.  Eyes:     Pupils: Pupils are equal, round, and reactive to light.  Neck:     Thyroid: No thyromegaly.     Vascular: No JVD.     Trachea: No tracheal deviation.  Cardiovascular:     Rate and Rhythm: Normal rate and regular rhythm.     Pulses: Intact distal pulses.  Pulmonary:     Effort: Pulmonary effort is normal. No respiratory distress.  Breath sounds: Normal breath sounds. No wheezing.  Abdominal:     Palpations: Abdomen is soft.     Tenderness: There is no abdominal tenderness. There is no guarding.   Musculoskeletal:     Cervical back: Neck supple.     Right knee: Swelling and bony tenderness present. No erythema or ecchymosis. Decreased range of motion. Tenderness present.  Lymphadenopathy:     Cervical: No cervical adenopathy.  Skin:    General: Skin is warm and dry.  Neurological:     Mental Status: He is alert and oriented to person, place, and time.  Psychiatric:        Mood and Affect: Mood and affect normal.       Labs:  Estimated body mass index is 34.61 kg/m as calculated from the following:   Height as of 11/11/20: 6\' 2"  (1.88 m).   Weight as of 11/11/20: 122.3 kg.   Imaging Review Plain radiographs demonstrate severe degenerative joint disease of the right knee(s).  The bone quality appears to be good for age and reported activity level.      Assessment/Plan:  End stage arthritis, right knee   The patient history, physical examination, clinical judgment of the provider and imaging studies are consistent with end stage degenerative joint disease of the right knee(s) and total knee arthroplasty is deemed medically necessary. The treatment options including medical management, injection therapy arthroscopy and arthroplasty were discussed at length. The risks and benefits of total knee arthroplasty were presented and reviewed. The risks due to aseptic loosening, infection, stiffness, patella tracking problems, thromboembolic complications and other imponderables were discussed. The patient acknowledged the explanation, agreed to proceed with the plan and consent was signed. Patient is being admitted for treatment for surgery, pain control, PT, OT, prophylactic antibiotics, VTE prophylaxis, progressive ambulation and ADL's and discharge planning. The patient is planning to be discharged home.     Patient's anticipated LOS is less than 2 midnights, meeting these requirements: - Lives within 1 hour of care - Has a competent adult at home to recover with post-op  recover - NO history of  - Chronic pain requiring opiods  - Heart failure  - Heart attack  - Stroke  - DVT/VTE  - Cardiac arrhythmia  - Respiratory Failure/COPD  - Renal failure  - Anemia  - Advanced Liver disease

## 2020-11-17 MED ORDER — DEXTROSE 5 % IV SOLN
3.0000 g | INTRAVENOUS | Status: AC
  Administered 2020-11-18: 3 g via INTRAVENOUS
  Filled 2020-11-17: qty 3

## 2020-11-18 ENCOUNTER — Encounter (HOSPITAL_COMMUNITY)
Admission: RE | Disposition: A | Discharge status: Home / Self Care | Payer: Self-pay | Source: Ambulatory Visit | Attending: Orthopedic Surgery

## 2020-11-18 ENCOUNTER — Observation Stay (HOSPITAL_COMMUNITY)
Admission: RE | Admit: 2020-11-18 | Discharge: 2020-11-19 | Disposition: A | Discharge status: Home / Self Care | Payer: PPO | Source: Ambulatory Visit | Attending: Orthopedic Surgery | Admitting: Orthopedic Surgery

## 2020-11-18 ENCOUNTER — Encounter (HOSPITAL_COMMUNITY): Payer: Self-pay | Admitting: Orthopedic Surgery

## 2020-11-18 ENCOUNTER — Ambulatory Visit (HOSPITAL_COMMUNITY): Payer: PPO | Admitting: Registered Nurse

## 2020-11-18 ENCOUNTER — Other Ambulatory Visit: Payer: Self-pay

## 2020-11-18 DIAGNOSIS — Z7984 Long term (current) use of oral hypoglycemic drugs: Secondary | ICD-10-CM | POA: Diagnosis not present

## 2020-11-18 DIAGNOSIS — Z79899 Other long term (current) drug therapy: Secondary | ICD-10-CM | POA: Diagnosis not present

## 2020-11-18 DIAGNOSIS — M659 Synovitis and tenosynovitis, unspecified: Secondary | ICD-10-CM | POA: Diagnosis not present

## 2020-11-18 DIAGNOSIS — Z96651 Presence of right artificial knee joint: Secondary | ICD-10-CM

## 2020-11-18 DIAGNOSIS — Z96652 Presence of left artificial knee joint: Secondary | ICD-10-CM | POA: Diagnosis not present

## 2020-11-18 DIAGNOSIS — I10 Essential (primary) hypertension: Secondary | ICD-10-CM | POA: Diagnosis not present

## 2020-11-18 DIAGNOSIS — M1711 Unilateral primary osteoarthritis, right knee: Principal | ICD-10-CM | POA: Diagnosis present

## 2020-11-18 DIAGNOSIS — I2510 Atherosclerotic heart disease of native coronary artery without angina pectoris: Secondary | ICD-10-CM | POA: Insufficient documentation

## 2020-11-18 DIAGNOSIS — Z86010 Personal history of colonic polyps: Secondary | ICD-10-CM | POA: Insufficient documentation

## 2020-11-18 DIAGNOSIS — Z87891 Personal history of nicotine dependence: Secondary | ICD-10-CM | POA: Diagnosis not present

## 2020-11-18 DIAGNOSIS — E119 Type 2 diabetes mellitus without complications: Secondary | ICD-10-CM | POA: Diagnosis not present

## 2020-11-18 DIAGNOSIS — I4510 Unspecified right bundle-branch block: Secondary | ICD-10-CM | POA: Diagnosis not present

## 2020-11-18 DIAGNOSIS — Z7901 Long term (current) use of anticoagulants: Secondary | ICD-10-CM | POA: Insufficient documentation

## 2020-11-18 DIAGNOSIS — G8918 Other acute postprocedural pain: Secondary | ICD-10-CM | POA: Diagnosis not present

## 2020-11-18 DIAGNOSIS — M25561 Pain in right knee: Secondary | ICD-10-CM | POA: Diagnosis present

## 2020-11-18 HISTORY — PX: TOTAL KNEE ARTHROPLASTY: SHX125

## 2020-11-18 LAB — GLUCOSE, CAPILLARY
Glucose-Capillary: 150 mg/dL — ABNORMAL HIGH (ref 70–99)
Glucose-Capillary: 145 mg/dL — ABNORMAL HIGH (ref 70–99)
Glucose-Capillary: 218 mg/dL — ABNORMAL HIGH (ref 70–99)
Glucose-Capillary: 315 mg/dL — ABNORMAL HIGH (ref 70–99)

## 2020-11-18 LAB — APTT: aPTT: 32 seconds (ref 24–36)

## 2020-11-18 SURGERY — ARTHROPLASTY, KNEE, TOTAL
Anesthesia: Spinal | Site: Knee | Laterality: Right

## 2020-11-18 MED ORDER — MAGNESIUM CITRATE PO SOLN: 1.0000 | Freq: Once | ORAL | Status: DC | PRN

## 2020-11-18 MED ORDER — ONDANSETRON HCL 4 MG/2ML IJ SOLN: 4.0000 mg | Freq: Once | INTRAMUSCULAR | Status: DC | PRN

## 2020-11-18 MED ORDER — MIDAZOLAM HCL 2 MG/2ML IJ SOLN
1.0000 mg | Freq: Once | INTRAMUSCULAR | Status: AC
  Administered 2020-11-18 (×2): 1 mg via INTRAVENOUS
  Filled 2020-11-18: qty 2

## 2020-11-18 MED ORDER — DIPHENHYDRAMINE HCL 12.5 MG/5ML PO ELIX: 12.5000 mg | ORAL_SOLUTION | ORAL | Status: DC | PRN

## 2020-11-18 MED ORDER — ATORVASTATIN CALCIUM 40 MG PO TABS
80.0000 mg | ORAL_TABLET | Freq: Every day | ORAL | Status: DC
  Administered 2020-11-18: 80 mg via ORAL
  Filled 2020-11-18: qty 2

## 2020-11-18 MED ORDER — ROPIVACAINE HCL 7.5 MG/ML IJ SOLN
INTRAMUSCULAR | Status: DC | PRN
  Administered 2020-11-18: 20 mL via PERINEURAL

## 2020-11-18 MED ORDER — CHLORHEXIDINE GLUCONATE 0.12 % MT SOLN
15.0000 mL | Freq: Once | OROMUCOSAL | Status: AC
  Administered 2020-11-18: 15 mL via OROMUCOSAL

## 2020-11-18 MED ORDER — TRANEXAMIC ACID-NACL 1000-0.7 MG/100ML-% IV SOLN
1000.0000 mg | Freq: Once | INTRAVENOUS | Status: AC
  Administered 2020-11-18: 1000 mg via INTRAVENOUS
  Filled 2020-11-18: qty 100

## 2020-11-18 MED ORDER — NITROGLYCERIN 0.4 MG SL SUBL: 0.4000 mg | SUBLINGUAL_TABLET | SUBLINGUAL | Status: DC | PRN

## 2020-11-18 MED ORDER — CEFAZOLIN SODIUM-DEXTROSE 2-4 GM/100ML-% IV SOLN
2.0000 g | Freq: Four times a day (QID) | INTRAVENOUS | Status: AC
  Administered 2020-11-18 – 2020-11-19 (×2): 2 g via INTRAVENOUS
  Filled 2020-11-18 (×2): qty 100

## 2020-11-18 MED ORDER — ONDANSETRON HCL 4 MG PO TABS: 4.0000 mg | ORAL_TABLET | Freq: Four times a day (QID) | ORAL | Status: DC | PRN

## 2020-11-18 MED ORDER — 0.9 % SODIUM CHLORIDE (POUR BTL) OPTIME
TOPICAL | Status: DC | PRN
  Administered 2020-11-18: 1000 mL

## 2020-11-18 MED ORDER — BUPIVACAINE-EPINEPHRINE (PF) 0.25% -1:200000 IJ SOLN
INTRAMUSCULAR | Status: AC
  Filled 2020-11-18: qty 30

## 2020-11-18 MED ORDER — PHENOL 1.4 % MT LIQD: 1.0000 | OROMUCOSAL | Status: DC | PRN

## 2020-11-18 MED ORDER — ACETAMINOPHEN 325 MG PO TABS: 325.0000 mg | ORAL_TABLET | Freq: Four times a day (QID) | ORAL | Status: DC | PRN

## 2020-11-18 MED ORDER — STERILE WATER FOR IRRIGATION IR SOLN
Status: DC | PRN
Start: 2020-11-18 — End: 2020-11-18
  Administered 2020-11-18: 2000 mL

## 2020-11-18 MED ORDER — METOCLOPRAMIDE HCL 5 MG PO TABS: 5.0000 mg | ORAL_TABLET | Freq: Three times a day (TID) | ORAL | Status: DC | PRN

## 2020-11-18 MED ORDER — POVIDONE-IODINE 10 % EX SWAB
2.0000 "application " | Freq: Once | CUTANEOUS | Status: AC
  Administered 2020-11-18: 2 via TOPICAL

## 2020-11-18 MED ORDER — ONDANSETRON HCL 4 MG/2ML IJ SOLN
INTRAMUSCULAR | Status: DC | PRN
  Administered 2020-11-18: 4 mg via INTRAVENOUS

## 2020-11-18 MED ORDER — HYDROCODONE-ACETAMINOPHEN 7.5-325 MG PO TABS
1.0000 | ORAL_TABLET | ORAL | Status: DC | PRN
  Administered 2020-11-19 (×2): 2 via ORAL
  Filled 2020-11-18 (×2): qty 2

## 2020-11-18 MED ORDER — BUPIVACAINE-EPINEPHRINE (PF) 0.25% -1:200000 IJ SOLN
INTRAMUSCULAR | Status: DC | PRN
  Administered 2020-11-18: 30 mL

## 2020-11-18 MED ORDER — FENTANYL CITRATE (PF) 100 MCG/2ML IJ SOLN
50.0000 ug | Freq: Once | INTRAMUSCULAR | Status: AC
  Administered 2020-11-18: 50 ug via INTRAVENOUS
  Filled 2020-11-18: qty 2

## 2020-11-18 MED ORDER — DEXAMETHASONE SODIUM PHOSPHATE 10 MG/ML IJ SOLN
10.0000 mg | Freq: Once | INTRAMUSCULAR | Status: AC
  Administered 2020-11-18: 8 mg via INTRAVENOUS

## 2020-11-18 MED ORDER — FERROUS SULFATE 325 (65 FE) MG PO TABS
325.0000 mg | ORAL_TABLET | Freq: Two times a day (BID) | ORAL | Status: DC
  Administered 2020-11-18 – 2020-11-19 (×2): 325 mg via ORAL
  Filled 2020-11-18 (×2): qty 1

## 2020-11-18 MED ORDER — METOCLOPRAMIDE HCL 5 MG/ML IJ SOLN: 5.0000 mg | Freq: Three times a day (TID) | INTRAMUSCULAR | Status: DC | PRN

## 2020-11-18 MED ORDER — PROPOFOL 1000 MG/100ML IV EMUL
INTRAVENOUS | Status: AC
  Filled 2020-11-18: qty 100

## 2020-11-18 MED ORDER — SODIUM CHLORIDE 0.9 % IR SOLN
Status: DC | PRN
  Administered 2020-11-18: 1000 mL

## 2020-11-18 MED ORDER — PROPOFOL 10 MG/ML IV BOLUS
INTRAVENOUS | Status: DC | PRN
  Administered 2020-11-18 (×2): 20 mg via INTRAVENOUS

## 2020-11-18 MED ORDER — EPHEDRINE SULFATE-NACL 50-0.9 MG/10ML-% IV SOSY
PREFILLED_SYRINGE | INTRAVENOUS | Status: DC | PRN
  Administered 2020-11-18: 5 mg via INTRAVENOUS
  Administered 2020-11-18: 10 mg via INTRAVENOUS
  Administered 2020-11-18: 5 mg via INTRAVENOUS

## 2020-11-18 MED ORDER — BISACODYL 10 MG RE SUPP: 10.0000 mg | Freq: Every day | RECTAL | Status: DC | PRN

## 2020-11-18 MED ORDER — METHOCARBAMOL 500 MG PO TABS
500.0000 mg | ORAL_TABLET | Freq: Four times a day (QID) | ORAL | Status: DC | PRN
  Administered 2020-11-18 – 2020-11-19 (×2): 500 mg via ORAL
  Filled 2020-11-18 (×2): qty 1

## 2020-11-18 MED ORDER — FLUTICASONE PROPIONATE 50 MCG/ACT NA SUSP
2.0000 | Freq: Every day | NASAL | Status: DC | PRN
  Filled 2020-11-18: qty 16

## 2020-11-18 MED ORDER — HYDROMORPHONE HCL 1 MG/ML IJ SOLN: 0.2500 mg | INTRAMUSCULAR | Status: DC | PRN

## 2020-11-18 MED ORDER — HYDROMORPHONE HCL 1 MG/ML IJ SOLN
0.5000 mg | INTRAMUSCULAR | Status: DC | PRN
  Administered 2020-11-19: 1 mg via INTRAVENOUS
  Filled 2020-11-18: qty 1

## 2020-11-18 MED ORDER — ORAL CARE MOUTH RINSE: 15.0000 mL | Freq: Once | OROMUCOSAL | Status: AC

## 2020-11-18 MED ORDER — PROPOFOL 500 MG/50ML IV EMUL
INTRAVENOUS | Status: DC | PRN
  Administered 2020-11-18: 40 ug/kg/min via INTRAVENOUS

## 2020-11-18 MED ORDER — SODIUM CHLORIDE (PF) 0.9 % IJ SOLN
INTRAMUSCULAR | Status: AC
  Filled 2020-11-18: qty 50

## 2020-11-18 MED ORDER — TRANEXAMIC ACID-NACL 1000-0.7 MG/100ML-% IV SOLN
1000.0000 mg | INTRAVENOUS | Status: AC
  Administered 2020-11-18: 1000 mg via INTRAVENOUS
  Filled 2020-11-18: qty 100

## 2020-11-18 MED ORDER — GLIPIZIDE ER 2.5 MG PO TB24
2.5000 mg | ORAL_TABLET | Freq: Every day | ORAL | Status: DC
  Administered 2020-11-19: 2.5 mg via ORAL
  Filled 2020-11-18: qty 1

## 2020-11-18 MED ORDER — ONDANSETRON HCL 4 MG/2ML IJ SOLN
INTRAMUSCULAR | Status: AC
  Filled 2020-11-18: qty 2

## 2020-11-18 MED ORDER — DEXAMETHASONE SODIUM PHOSPHATE 10 MG/ML IJ SOLN
INTRAMUSCULAR | Status: AC
  Filled 2020-11-18: qty 1

## 2020-11-18 MED ORDER — LACTATED RINGERS IV SOLN: INTRAVENOUS | Status: DC

## 2020-11-18 MED ORDER — SODIUM CHLORIDE (PF) 0.9 % IJ SOLN
INTRAMUSCULAR | Status: DC | PRN
  Administered 2020-11-18: 30 mL

## 2020-11-18 MED ORDER — MENTHOL 3 MG MT LOZG: 1.0000 | LOZENGE | OROMUCOSAL | Status: DC | PRN

## 2020-11-18 MED ORDER — ENOXAPARIN SODIUM 120 MG/0.8ML ~~LOC~~ SOLN
120.0000 mg | Freq: Two times a day (BID) | SUBCUTANEOUS | Status: DC
  Administered 2020-11-19: 120 mg via SUBCUTANEOUS
  Filled 2020-11-18 (×2): qty 0.8

## 2020-11-18 MED ORDER — ALUM & MAG HYDROXIDE-SIMETH 200-200-20 MG/5ML PO SUSP: 15.0000 mL | ORAL | Status: DC | PRN

## 2020-11-18 MED ORDER — DEXAMETHASONE SODIUM PHOSPHATE 10 MG/ML IJ SOLN
10.0000 mg | Freq: Once | INTRAMUSCULAR | Status: AC
  Administered 2020-11-19: 10 mg via INTRAVENOUS
  Filled 2020-11-18: qty 1

## 2020-11-18 MED ORDER — METHOCARBAMOL 1000 MG/10ML IJ SOLN
500.0000 mg | Freq: Four times a day (QID) | INTRAVENOUS | Status: DC | PRN
  Filled 2020-11-18: qty 5

## 2020-11-18 MED ORDER — HYDROCODONE-ACETAMINOPHEN 5-325 MG PO TABS
1.0000 | ORAL_TABLET | ORAL | Status: DC | PRN
  Administered 2020-11-18 – 2020-11-19 (×3): 2 via ORAL
  Filled 2020-11-18 (×3): qty 2

## 2020-11-18 MED ORDER — BUPIVACAINE IN DEXTROSE 0.75-8.25 % IT SOLN
INTRATHECAL | Status: DC | PRN
  Administered 2020-11-18: 2 mL via INTRATHECAL

## 2020-11-18 MED ORDER — KETOROLAC TROMETHAMINE 30 MG/ML IJ SOLN
INTRAMUSCULAR | Status: AC
  Filled 2020-11-18: qty 1

## 2020-11-18 MED ORDER — CHLORTHALIDONE 25 MG PO TABS
25.0000 mg | ORAL_TABLET | Freq: Every day | ORAL | Status: DC
  Administered 2020-11-19: 25 mg via ORAL
  Filled 2020-11-18: qty 1

## 2020-11-18 MED ORDER — DOCUSATE SODIUM 100 MG PO CAPS
100.0000 mg | ORAL_CAPSULE | Freq: Two times a day (BID) | ORAL | Status: DC
Start: 2020-11-18 — End: 2020-11-19
  Administered 2020-11-18 – 2020-11-19 (×2): 100 mg via ORAL
  Filled 2020-11-18 (×2): qty 1

## 2020-11-18 MED ORDER — EPHEDRINE 5 MG/ML INJ
INTRAVENOUS | Status: AC
  Filled 2020-11-18: qty 10

## 2020-11-18 MED ORDER — ESCITALOPRAM OXALATE 10 MG PO TABS
10.0000 mg | ORAL_TABLET | Freq: Every day | ORAL | Status: DC
  Administered 2020-11-19: 10 mg via ORAL
  Filled 2020-11-18: qty 1

## 2020-11-18 MED ORDER — SODIUM CHLORIDE 0.9 % IV SOLN: INTRAVENOUS | Status: DC

## 2020-11-18 MED ORDER — ONDANSETRON HCL 4 MG/2ML IJ SOLN: 4.0000 mg | Freq: Four times a day (QID) | INTRAMUSCULAR | Status: DC | PRN

## 2020-11-18 MED ORDER — INSULIN ASPART 100 UNIT/ML ~~LOC~~ SOLN
0.0000 [IU] | Freq: Three times a day (TID) | SUBCUTANEOUS | Status: DC
  Administered 2020-11-18: 5 [IU] via SUBCUTANEOUS
  Administered 2020-11-19: 2 [IU] via SUBCUTANEOUS
  Administered 2020-11-19: 3 [IU] via SUBCUTANEOUS

## 2020-11-18 MED ORDER — KETOROLAC TROMETHAMINE 30 MG/ML IJ SOLN
INTRAMUSCULAR | Status: DC | PRN
  Administered 2020-11-18: 30 mg via INTRA_ARTICULAR

## 2020-11-18 MED ORDER — POLYETHYLENE GLYCOL 3350 17 G PO PACK
17.0000 g | PACK | Freq: Two times a day (BID) | ORAL | Status: DC
  Administered 2020-11-18 – 2020-11-19 (×2): 17 g via ORAL
  Filled 2020-11-18 (×2): qty 1

## 2020-11-18 MED ORDER — PANTOPRAZOLE SODIUM 40 MG PO TBEC
40.0000 mg | DELAYED_RELEASE_TABLET | Freq: Every day | ORAL | Status: DC
  Administered 2020-11-18 – 2020-11-19 (×2): 40 mg via ORAL
  Filled 2020-11-18 (×2): qty 1

## 2020-11-18 MED ORDER — METFORMIN HCL 500 MG PO TABS
1000.0000 mg | ORAL_TABLET | Freq: Every day | ORAL | Status: DC
  Administered 2020-11-19: 1000 mg via ORAL
  Filled 2020-11-18: qty 2

## 2020-11-18 SURGICAL SUPPLY — 62 items
ATTUNE MED ANAT PAT 41 KNEE (Knees) ×2 IMPLANT
ATTUNE PS FEM RT SZ 6 CEM KNEE (Femur) ×2 IMPLANT
ATTUNE PSRP INSR SZ6 8 KNEE (Insert) ×2 IMPLANT
BAG SPEC THK2 15X12 ZIP CLS (MISCELLANEOUS)
BAG ZIPLOCK 12X15 (MISCELLANEOUS) IMPLANT
BASE TIBIAL ROT PLAT SZ 7 KNEE (Knees) ×1 IMPLANT
BLADE SAW SGTL 11.0X1.19X90.0M (BLADE) IMPLANT
BLADE SAW SGTL 13.0X1.19X90.0M (BLADE) ×2 IMPLANT
BLADE SURG SZ10 CARB STEEL (BLADE) ×4 IMPLANT
BNDG ELASTIC 6X5.8 VLCR STR LF (GAUZE/BANDAGES/DRESSINGS) ×2 IMPLANT
BOWL SMART MIX CTS (DISPOSABLE) ×2 IMPLANT
BSPLAT TIB 7 CMNT ROT PLAT STR (Knees) ×1 IMPLANT
CEMENT HV SMART SET (Cement) ×4 IMPLANT
COVER SURGICAL LIGHT HANDLE (MISCELLANEOUS) ×2 IMPLANT
COVER WAND RF STERILE (DRAPES) IMPLANT
CUFF TOURN SGL QUICK 34 (TOURNIQUET CUFF) ×2
CUFF TRNQT CYL 34X4.125X (TOURNIQUET CUFF) ×1 IMPLANT
DECANTER SPIKE VIAL GLASS SM (MISCELLANEOUS) ×4 IMPLANT
DERMABOND ADVANCED (GAUZE/BANDAGES/DRESSINGS) ×1
DERMABOND ADVANCED .7 DNX12 (GAUZE/BANDAGES/DRESSINGS) ×1 IMPLANT
DRAPE U-SHAPE 47X51 STRL (DRAPES) ×2 IMPLANT
DRESSING AQUACEL AG SP 3.5X10 (GAUZE/BANDAGES/DRESSINGS) ×1 IMPLANT
DRSG AQUACEL AG ADV 3.5X10 (GAUZE/BANDAGES/DRESSINGS) ×2 IMPLANT
DRSG AQUACEL AG SP 3.5X10 (GAUZE/BANDAGES/DRESSINGS) ×2
DURAPREP 26ML APPLICATOR (WOUND CARE) ×4 IMPLANT
ELECT REM PT RETURN 15FT ADLT (MISCELLANEOUS) ×2 IMPLANT
GLOVE BIO SURGEON STRL SZ 6 (GLOVE) ×2 IMPLANT
GLOVE BIOGEL PI IND STRL 6.5 (GLOVE) ×1 IMPLANT
GLOVE BIOGEL PI IND STRL 7.5 (GLOVE) ×1 IMPLANT
GLOVE BIOGEL PI IND STRL 8.5 (GLOVE) ×1 IMPLANT
GLOVE BIOGEL PI INDICATOR 6.5 (GLOVE) ×1
GLOVE BIOGEL PI INDICATOR 7.5 (GLOVE) ×1
GLOVE BIOGEL PI INDICATOR 8.5 (GLOVE) ×1
GLOVE ECLIPSE 8.0 STRL XLNG CF (GLOVE) ×2 IMPLANT
GLOVE ORTHO TXT STRL SZ7.5 (GLOVE) ×2 IMPLANT
GOWN STRL REUS W/ TWL LRG LVL3 (GOWN DISPOSABLE) ×1 IMPLANT
GOWN STRL REUS W/TWL 2XL LVL3 (GOWN DISPOSABLE) ×2 IMPLANT
GOWN STRL REUS W/TWL LRG LVL3 (GOWN DISPOSABLE) ×4 IMPLANT
HANDPIECE INTERPULSE COAX TIP (DISPOSABLE) ×2
HOLDER FOLEY CATH W/STRAP (MISCELLANEOUS) IMPLANT
KIT TURNOVER KIT A (KITS) IMPLANT
MANIFOLD NEPTUNE II (INSTRUMENTS) ×2 IMPLANT
NDL SAFETY ECLIPSE 18X1.5 (NEEDLE) IMPLANT
NEEDLE HYPO 18GX1.5 SHARP (NEEDLE)
NS IRRIG 1000ML POUR BTL (IV SOLUTION) ×2 IMPLANT
PACK TOTAL KNEE CUSTOM (KITS) ×2 IMPLANT
PENCIL SMOKE EVACUATOR (MISCELLANEOUS) IMPLANT
PIN DRILL FIX HALF THREAD (BIT) ×2 IMPLANT
PIN FIX SIGMA LCS THRD HI (PIN) ×2 IMPLANT
PROTECTOR NERVE ULNAR (MISCELLANEOUS) ×2 IMPLANT
SET HNDPC FAN SPRY TIP SCT (DISPOSABLE) ×1 IMPLANT
SET PAD KNEE POSITIONER (MISCELLANEOUS) ×2 IMPLANT
SUT MNCRL AB 4-0 PS2 18 (SUTURE) ×2 IMPLANT
SUT STRATAFIX PDS+ 0 24IN (SUTURE) ×2 IMPLANT
SUT VIC AB 1 CT1 36 (SUTURE) ×2 IMPLANT
SUT VIC AB 2-0 CT1 27 (SUTURE) ×6
SUT VIC AB 2-0 CT1 TAPERPNT 27 (SUTURE) ×3 IMPLANT
SYR 3ML LL SCALE MARK (SYRINGE) ×2 IMPLANT
TIBIAL BASE ROT PLAT SZ 7 KNEE (Knees) ×2 IMPLANT
TRAY FOLEY MTR SLVR 16FR STAT (SET/KITS/TRAYS/PACK) ×2 IMPLANT
WATER STERILE IRR 1000ML POUR (IV SOLUTION) ×4 IMPLANT
WRAP KNEE MAXI GEL POST OP (GAUZE/BANDAGES/DRESSINGS) ×2 IMPLANT

## 2020-11-18 NOTE — Transfer of Care (Addendum)
Immediate Anesthesia Transfer of Care Note  Patient: [name]  Procedure(s) Performed: TOTAL KNEE ARTHROPLASTY (Right Knee)  Patient Location: PACU  Anesthesia Type:MAC and Spinal  Level of Consciousness: awake, alert , oriented and patient cooperative  Airway & Oxygen Therapy: Patient Spontanous Breathing and Patient connected to face mask oxygen  Post-op Assessment: Report given to RN and Post -op Vital signs reviewed and stable  Post vital signs: Reviewed and stable  Last Vitals:  Vitals Value Taken Time  BP 122/74 11/18/20 1423  Temp    Pulse 51 11/18/20 1425  Resp 14 11/18/20 1425  SpO2 100 % 11/18/20 1425  Vitals shown include unvalidated device data.  Last Pain:  Vitals:   11/18/20 1017  TempSrc:   PainSc: 5       Patients Stated Pain Goal: 4 (94/85/46 2703)  Complications: No complications documented.

## 2020-11-18 NOTE — Anesthesia Preprocedure Evaluation (Addendum)
Anesthesia Evaluation  Patient identified by MRN, date of birth, ID band Patient awake    Reviewed: Allergy & Precautions, NPO status , Patient's Chart, lab work & pertinent test results, reviewed documented beta blocker date and time   History of Anesthesia Complications (+) AWARENESS UNDER ANESTHESIA  Airway Mallampati: II  TM Distance: >3 FB Neck ROM: Full    Dental no notable dental hx. (+) Teeth Intact, Caps, Partial Lower, Dental Advisory Given, Missing,    Pulmonary sleep apnea and Continuous Positive Airway Pressure Ventilation , former smoker, PE   Pulmonary exam normal breath sounds clear to auscultation       Cardiovascular hypertension, Pt. on medications + CAD  Normal cardiovascular exam+ dysrhythmias  Rhythm:Regular Rate:Normal  EKG  NSR, RBBB pattern  Echo 11/28/2018 Left ventricle: The cavity size was normal. There was mild concentric hypertrophy. Systolic function was normal. The estimated ejection fraction was in the range of 60% to 65%. Wall motion was normal; there were no regional wall motion abnormalities. Doppler parameters are consistent with abnormal left ventricular relaxation (grade 1 diastolic dysfunction). Doppler parameters are consistent with indeterminate ventricular filling pressure.  - Aortic valve: Transvalvular velocity was within the normal range.  There was no stenosis. There was no regurgitation. Valve area  (VTI): 2.67 cm^2. Valve area (Vmean): 3.07 cm^2.  - Aorta: Ascending aortic diameter: 41 mm (S).  - Ascending aorta: The ascending aorta was mildly dilated.  - Mitral valve: Transvalvular velocity was within the normal range. There was no evidence for stenosis. There was no regurgitation.  - Right ventricle: The cavity size was normal. Wall thickness was normal. Systolic function was normal.  - Tricuspid valve: There was no regurgitation.    Neuro/Psych  Headaches, PSYCHIATRIC  DISORDERS Depression  Neuromuscular disease    GI/Hepatic negative GI ROS, Neg liver ROS,   Endo/Other  diabetes, Well Controlled, Type 2, Oral Hypoglycemic Agents  Renal/GU Renal diseaseHx/o renal calculi  negative genitourinary   Musculoskeletal  (+) Arthritis , Osteoarthritis,  OA right knee   Abdominal (+) + obese,   Peds  Hematology  (+) REFUSES BLOOD PRODUCTS, JEHOVAH'S WITNESSChronic anticoagulation- Coumadin- last dose 12/8 Enoxaparin bridge- last dose 12/13 PTT normal this am   Anesthesia Other Findings   Reproductive/Obstetrics                           Anesthesia Physical Anesthesia Plan  ASA: III  Anesthesia Plan: Spinal   Post-op Pain Management:  Regional for Post-op pain   Induction: Intravenous  PONV Risk Score and Plan: 2 and Ondansetron, Propofol infusion and Treatment may vary due to age or medical condition  Airway Management Planned: Natural Airway, Simple Face Mask and Nasal Cannula  Additional Equipment:   Intra-op Plan:   Post-operative Plan:   Informed Consent: I have reviewed the patients History and Physical, chart, labs and discussed the procedure including the risks, benefits and alternatives for the proposed anesthesia with the patient or authorized representative who has indicated his/her understanding and acceptance.     Dental advisory given  Plan Discussed with: CRNA and Anesthesiologist  Anesthesia Plan Comments:         Anesthesia Quick Evaluation

## 2020-11-18 NOTE — Anesthesia Procedure Notes (Signed)
Spinal  Patient location during procedure: OR Start time: 11/18/2020 12:16 PM End time: 11/18/2020 12:20 PM Staffing Resident/CRNA: Victoriano Lain, CRNA Preanesthetic Checklist Completed: patient identified, IV checked, site marked, risks and benefits discussed, surgical consent, monitors and equipment checked, pre-op evaluation and timeout performed Spinal Block Patient position: sitting Prep: DuraPrep and site prepped and draped Patient monitoring: heart rate, continuous pulse ox and blood pressure Approach: midline Location: L3-4 Injection technique: single-shot Needle Needle type: Pencan  Needle gauge: 24 G Needle length: 10 cm Assessment Sensory level: T4 Additional Notes Pt placed in sitting position for spinal placement. Spinal kit expiration date checked and verified. One attempt, + clear CSF, -heme. Pt tolerated well.

## 2020-11-18 NOTE — Discharge Instructions (Signed)

## 2020-11-18 NOTE — Op Note (Signed)
NAME:  [name]                      MEDICAL RECORD NO.:  [id number]                             FACILITY:  [facility]      PHYSICIAN:  Pietro Cassis. Alvan Dame, M.D.  DATE OF BIRTH:  16-Nov-1956      DATE OF PROCEDURE:  11/18/2020                                     OPERATIVE REPORT         PREOPERATIVE DIAGNOSIS:  Right knee osteoarthritis.      POSTOPERATIVE DIAGNOSIS:  Right knee osteoarthritis.      FINDINGS:  The patient was noted to have complete loss of cartilage and   bone-on-bone arthritis with associated osteophytes in the medial and patellofemoral compartments of   the knee with a significant synovitis and associated effusion.  The patient had failed months of conservative treatment including medications, injection therapy, activity modification.     PROCEDURE:  Right total knee replacement.      COMPONENTS USED:  DePuy Attune rotating platform posterior stabilized knee   system, a size 6 femur, 7 tibia, size 8 mm PS AOX insert, and 41 anatomic patellar   button.      SURGEON:  Pietro Cassis. Alvan Dame, M.D.      ASSISTANT:  Danae Orleans, PA-C.      ANESTHESIA:  Spinal.      SPECIMENS:  None.      COMPLICATION:  None.      DRAINS:  None.  EBL: <150cc      TOURNIQUET TIME:   Total Tourniquet Time Documented: Thigh (Right) - 39 minutes Total: Thigh (Right) - 39 minutes  .      The patient was stable to the recovery room.      INDICATION FOR PROCEDURE:  [name] is a 64 y.o. male patient of   mine.  The patient had been seen, evaluated, and treated for months conservatively in the   office with medication, activity modification, and injections.  The patient had   radiographic changes of bone-on-bone arthritis with endplate sclerosis and osteophytes noted.  Based on the radiographic changes and failed conservative measures, the patient   decided to proceed with definitive treatment, total knee replacement.  Risks of infection, DVT, component failure, need for  revision surgery, neurovascular injury were reviewed in the office setting.  The postop course was reviewed stressing the efforts to maximize post-operative satisfaction and function.  Consent was obtained for benefit of pain   relief.      PROCEDURE IN DETAIL:  The patient was brought to the operative theater.   Once adequate anesthesia, preoperative antibiotics, 3 gm of Ancef,1 gm of Tranexamic Acid, and 10 mg of Decadron administered, the patient was positioned supine with a right thigh tourniquet placed.  The  right lower extremity was prepped and draped in sterile fashion.  A time-   out was performed identifying the patient, planned procedure, and the appropriate extremity.      The right lower extremity was placed in the [facility] leg holder.  The leg was   exsanguinated, tourniquet elevated to 250 mmHg.  A midline incision was   made followed by  median parapatellar arthrotomy.  Following initial   exposure, attention was first directed to the patella.  Precut   measurement was noted to be 26 mm.  I resected down to 14-15 mm and used a   41 anatomic patellar button to restore patellar height as well as cover the cut surface.      The lug holes were drilled and a metal shim was placed to protect the   patella from retractors and saw blade during the procedure.      At this point, attention was now directed to the femur.  The femoral   canal was opened with a drill, irrigated to try to prevent fat emboli.  An   intramedullary rod was passed at 5 degrees valgus, 9 mm of bone was   resected off the distal femur.  Following this resection, the tibia was   subluxated anteriorly.  Using the extramedullary guide, 2 mm of bone was resected off   the proximal medial tibia.  We confirmed the gap would be   stable medially and laterally with a size 6 spacer block as well as confirmed that the tibial cut was perpendicular in the coronal plane, checking with an alignment rod.      Once this was done,  I sized the femur to be a size 6 in the anterior-   posterior dimension, chose a standard component based on medial and   lateral dimension.  The size 6 rotation block was then pinned in   position anterior referenced using the C-clamp to set rotation.  The   anterior, posterior, and  chamfer cuts were made without difficulty nor   notching making certain that I was along the anterior cortex to help   with flexion gap stability.      The final box cut was made off the lateral aspect of distal femur.      At this point, the tibia was sized to be a size 7.  The size 7 tray was   then pinned in position through the medial third of the tubercle,   drilled, and keel punched.  Trial reduction was now carried with a 6 femur,  7 tibia, a size 8 mm PS insert, and the 41 anatomic patella botton.  The knee was brought to full extension with good flexion stability with the patella   tracking through the trochlea without application of pressure.  Given   all these findings the trial components removed.  Final components were   opened and cement was mixed.  The knee was irrigated with normal saline solution and pulse lavage.  The synovial lining was   then injected with 30 cc of 0.25% Marcaine with epinephrine, 1 cc of Toradol and 30 cc of NS for a total of 61 cc.     Final implants were then cemented onto cleaned and dried cut surfaces of bone with the knee brought to extension with a size 8 mm PS trial insert.      Once the cement had fully cured, excess cement was removed   throughout the knee.  I confirmed that I was satisfied with the range of   motion and stability, and the final size 8 mm PS AOX insert was chosen.  It was   placed into the knee.      The tourniquet had been let down at 38 minutes.  No significant   hemostasis was required.  The extensor mechanism was then reapproximated using #1 Vicryl and #1 Stratafix  sutures with the knee   in flexion.  The   remaining wound was closed with  2-0 Vicryl and running 4-0 Monocryl.   The knee was cleaned, dried, dressed sterilely using Dermabond and   Aquacel dressing.  The patient was then   brought to recovery room in stable condition, tolerating the procedure   well.   Please note that Physician Assistant, Danae Orleans, PA-C was present for the entirety of the case, and was utilized for pre-operative positioning, peri-operative retractor management, general facilitation of the procedure and for primary wound closure at the end of the case.              Pietro Cassis Alvan Dame, M.D.    11/18/2020 1:57 PM

## 2020-11-18 NOTE — Progress Notes (Signed)
Assisted Dr.Michael Foster with Right Knee Adductor Canal block. Side rails up, monitors on throughout procedure. See vital signs in flow sheet. Tolerated Procedure well.  

## 2020-11-18 NOTE — Anesthesia Postprocedure Evaluation (Signed)
Anesthesia Post Note  Patient: CHANDAN FLY  Procedure(s) Performed: TOTAL KNEE ARTHROPLASTY (Right Knee)     Patient location during evaluation: PACU Anesthesia Type: Spinal Level of consciousness: oriented and awake and alert Pain management: pain level controlled Vital Signs Assessment: post-procedure vital signs reviewed and stable Respiratory status: spontaneous breathing, respiratory function stable and nonlabored ventilation Cardiovascular status: blood pressure returned to baseline and stable Postop Assessment: no headache, no backache, no apparent nausea or vomiting, spinal receding and patient able to bend at knees Anesthetic complications: no   No complications documented.  Last Vitals:  Vitals:   11/18/20 1517 11/18/20 1620  BP: 120/75 121/70  Pulse: (!) 51 (!) 50  Resp: 16 17  Temp: (!) 36.3 C (!) 36.4 C  SpO2: 100% 100%    Last Pain:  Vitals:   11/18/20 1620  TempSrc: Oral  PainSc:                  Keerstin Bjelland A.

## 2020-11-18 NOTE — Anesthesia Procedure Notes (Addendum)
Anesthesia Regional Block: Adductor canal block   Pre-Anesthetic Checklist: ,, timeout performed, Correct Patient, Correct Site, Correct Laterality, Correct Procedure, Correct Position, site marked, Risks and benefits discussed,  Surgical consent,  Pre-op evaluation,  At surgeon's request and post-op pain management  Laterality: Right  Prep: chloraprep       Needles:  Injection technique: Single-shot  Needle Type: Echogenic Stimulator Needle     Needle Length: 9cm  Needle Gauge: 21   Needle insertion depth: 7 cm   Additional Needles:   Procedures:,,,, ultrasound used (permanent image in chart),,,,  Narrative:  Start time: 11/18/2020 11:58 AM End time: 11/18/2020 12:03 PM Injection made incrementally with aspirations every 5 mL.  Performed by: Personally  Anesthesiologist: Josephine Igo, MD  Additional Notes: Timeout performed. Patient sedated. Relevant anatomy ID'd using [place]. Incremental 2-17ml injection of LA with frequent aspiration. Patient tolerated procedure well.        Right Adductor Canal Block

## 2020-11-18 NOTE — Progress Notes (Signed)
PT Cancellation Note  Patient Details Name: [name] MRN: [id number] DOB: 12/30/1955   Cancelled Treatment:    Reason Eval/Treat Not Completed: Patient not medically ready (Pt with limited LE strength secondary to spinal block. Will follow up tomorrow for PT eval/treat when pt is able to participate.)  [name] PT, [place]  Office [phone] Pager [phone]  11/18/2020 5:28 PM

## 2020-11-18 NOTE — Progress Notes (Signed)
ANTICOAGULATION CONSULT NOTE - Initial Consult  Pharmacy Consult for Warfarin Indication: Hx of pulmonary embolus  Allergies  Allergen Reactions  . Adhesive [Tape] Other (See Comments)    Painful, Please use "paper" tape  . Other     NO BLOOD -JEHOVAH'S WITNESS-SIGNED REFUSAL BUT WOULD TAKE ALBUMIN    Patient Measurements: Height: 6\' 2"  (188 cm) Weight: 122.3 kg (269 lb 9 oz) IBW/kg (Calculated) : 82.2  Vital Signs: Temp: 98.3 F (36.8 C) (12/14 1004) Temp Source: Oral (12/14 1004) BP: 134/78 (12/14 1204) Pulse Rate: 69 (12/14 1204)  Labs: Recent Labs    11/18/20 1013  APTT 32    Estimated Creatinine Clearance: 80.7 mL/min (A) (by C-G formula based on SCr of 1.25 mg/dL (H)).   Medical History: Past Medical History:  Diagnosis Date  . Arthritis   . BACK PAIN   . CAD in native artery    a. moderate by cardiac CT 2016.  Marland Kitchen DEGENERATIVE JOINT DISEASE, KNEES, BILATERAL   . Depression   . Diabetes mellitus    type II  . DIVERTICULOSIS, COLON   . DVT, lower extremity ([place])    right  . History of kidney stones   . Hyperlipidemia   . Hypertension   . HYPOGONADISM, MALE   . HYPOTENSION, ORTHOSTATIC   . Kidney stones   . Long term current use of anticoagulant   . Obesity   . Pulmonary emboli ([place])   . Renal insufficiency    a. prior h/o, does not appear chronic.    Medications:  Scheduled:   Infusions:  . lactated ringers 10 mL/hr at 11/18/20 1210    Assessment: 25 yoM admitted on 12/14 for total knee arthroplasty.  PMH includes PE on chronic warfarin anticoagulation. PTA warfarin dose is 10mg  daily except for 15 mg on Tuesdays  Cindy Boyd,RN who manages the patient's coumadin, wrote a progress note on 11/04/20 with the following plan: 12/9 - Last dose of warfarin until after surgery 12/10 - Nothing (No warfarin and No Lovenox) 12/11 - Lovenox in the AM and PM (12 hours apart) 12/12 - Lovenox in the AM and PM 12/13 - Lovenox in the AM only! (PLEASE  TAKE BY 7 AM ) 12/14 - Procedure (No Lovenox today) 12/15 - Lovenox in the AM and PM and 1 1/2 tablets of warfarin (15 mg) 12/16 - Lovenox in the AM and PM and 1 1/2 tablets of warfarin (15 mg) 12/17 - Lovenox in the AM and PM and 1 1/2 tablets of warfarin (15 mg) 12/18 - Lovenox in the AM and PM and 1 1/2 tablets of warfarin (15 mg) 12/19- Stop Lovenox and continue warfarin 10 mg daily except 15 mg on Tuesdays and please have [place] check INR and report to [facility]   Goal of Therapy:  INR 2-3 Monitor platelets by anticoagulation protocol: Yes   Plan:  No warfarin today on POD#0 Daily PT/INR. Monitor for signs and symptoms of bleeding. Pharmacy will continue to monitor.   Gretta Arab PharmD, BCPS Clinical Pharmacist [facility] main pharmacy [phone] 11/18/2020 1:04 PM

## 2020-11-18 NOTE — Interval H&P Note (Signed)
History and Physical Interval Note:  11/18/2020 9:58 AM  [name]  has presented today for surgery, with the diagnosis of Right knee osteoarthritis.  The various methods of treatment have been discussed with the patient and family. After consideration of risks, benefits and other options for treatment, the patient has consented to  Procedure(s) with comments: TOTAL KNEE ARTHROPLASTY (Right) - 70 mins as a surgical intervention.  The patient's history has been reviewed, patient examined, no change in status, stable for surgery.  I have reviewed the patient's chart and labs.  Questions were answered to the patient's satisfaction.     [name]

## 2020-11-19 DIAGNOSIS — M1711 Unilateral primary osteoarthritis, right knee: Secondary | ICD-10-CM | POA: Diagnosis not present

## 2020-11-19 DIAGNOSIS — Z96651 Presence of right artificial knee joint: Secondary | ICD-10-CM | POA: Diagnosis not present

## 2020-11-19 LAB — BASIC METABOLIC PANEL
Anion gap: 10 (ref 5–15)
BUN: 30 mg/dL — ABNORMAL HIGH (ref 8–23)
CO2: 23 mmol/L (ref 22–32)
Calcium: 8.4 mg/dL — ABNORMAL LOW (ref 8.9–10.3)
Chloride: 102 mmol/L (ref 98–111)
Creatinine, Ser: 1.31 mg/dL — ABNORMAL HIGH (ref 0.61–1.24)
GFR, Estimated: >60 mL/min (ref >60)
Glucose, Bld: 202 mg/dL — ABNORMAL HIGH (ref 70–99)
Potassium: 4.5 mmol/L (ref 3.5–5.1)
Sodium: 135 mmol/L (ref 135–145)

## 2020-11-19 LAB — GLUCOSE, CAPILLARY
Glucose-Capillary: 133 mg/dL — ABNORMAL HIGH (ref 70–99)
Glucose-Capillary: 187 mg/dL — ABNORMAL HIGH (ref 70–99)

## 2020-11-19 LAB — CBC
HCT: 35.3 % — ABNORMAL LOW (ref 39.0–52.0)
Hemoglobin: 10.8 g/dL — ABNORMAL LOW (ref 13.0–17.0)
MCH: 22.4 pg — ABNORMAL LOW (ref 26.0–34.0)
MCHC: 30.6 g/dL (ref 30.0–36.0)
MCV: 73.1 fL — ABNORMAL LOW (ref 80.0–100.0)
Platelets: 168 10*3/uL (ref 150–400)
RBC: 4.83 MIL/uL (ref 4.22–5.81)
RDW: 15 % (ref 11.5–15.5)
WBC: 11.7 10*3/uL — ABNORMAL HIGH (ref 4.0–10.5)
nRBC: 0 % (ref 0.0–0.2)

## 2020-11-19 LAB — PROTIME-INR
INR: 1.1 (ref 0.8–1.2)
Prothrombin Time: 13.7 seconds (ref 11.4–15.2)

## 2020-11-19 MED ORDER — DOCUSATE SODIUM 100 MG PO CAPS: 100.0000 mg | ORAL_CAPSULE | Freq: Two times a day (BID) | ORAL | 0 refills | Status: DC

## 2020-11-19 MED ORDER — FERROUS SULFATE 325 (65 FE) MG PO TABS: 325.0000 mg | ORAL_TABLET | Freq: Three times a day (TID) | ORAL | 0 refills | Status: DC

## 2020-11-19 MED ORDER — POLYETHYLENE GLYCOL 3350 17 G PO PACK: 17.0000 g | PACK | Freq: Two times a day (BID) | ORAL | 0 refills | Status: DC

## 2020-11-19 MED ORDER — HYDROCODONE-ACETAMINOPHEN 7.5-325 MG PO TABS: 1.0000 | ORAL_TABLET | ORAL | 0 refills | Status: DC | PRN

## 2020-11-19 MED ORDER — METHOCARBAMOL 500 MG PO TABS: 500.0000 mg | ORAL_TABLET | Freq: Four times a day (QID) | ORAL | 0 refills | Status: DC | PRN

## 2020-11-19 MED ORDER — WARFARIN SODIUM 5 MG PO TABS: 15.0000 mg | ORAL_TABLET | Freq: Once | ORAL | Status: DC

## 2020-11-19 MED ORDER — WARFARIN - PHARMACIST DOSING INPATIENT: Freq: Every day | Status: DC

## 2020-11-19 MED ORDER — CHLORHEXIDINE GLUCONATE CLOTH 2 % EX PADS
6.0000 | MEDICATED_PAD | Freq: Every day | CUTANEOUS | Status: DC
  Administered 2020-11-19: 6 via TOPICAL

## 2020-11-19 NOTE — Progress Notes (Signed)
Physical Therapy Treatment Patient Details Name: [name] MRN: [id number] DOB: March 06, 1956 Today's Date: 11/19/2020    History of Present Illness 63 y.o. male admitted 11/18/20 for R TKA. PMH includes L TKA, ankle surgery, lumbar laminectomy.    PT Comments    Pt completed stair training and demonstrates good understanding of TKA HEP. He is ready to DC home from PT standpoint.   Follow Up Recommendations  Follow surgeon's recommendation for DC plan and follow-up therapies     Equipment Recommendations  Rolling walker with 5" wheels;3in1 (PT)    Recommendations for Other Services       Precautions / Restrictions Precautions Precautions: Knee Precaution Booklet Issued: Yes (comment) Precaution Comments: reviewed no pillow under knee Restrictions Weight Bearing Restrictions: No RLE Weight Bearing: Weight bearing as tolerated    Mobility  Bed Mobility Overal bed mobility: Modified Independent             General bed mobility comments: up in recliner  Transfers Overall transfer level: Needs assistance Equipment used: Rolling walker (2 wheeled) Transfers: Sit to/from Stand Sit to Stand: Supervision         General transfer comment: good hand placement  Ambulation/Gait Ambulation/Gait assistance: Supervision Gait Distance (Feet): 40 Feet Assistive device: Rolling walker (2 wheeled) Gait Pattern/deviations: Step-to pattern;Decreased stride length Gait velocity: decr   General Gait Details: no loss of balance, distance limited by  pain   Stairs Stairs: Yes Stairs assistance: Min guard Stair Management: One rail Left;Forwards;Step to pattern;With cane Number of Stairs: 5 General stair comments: VCs sequencing   Wheelchair Mobility    Modified Rankin (Stroke Patients Only)       Balance Overall balance assessment: Modified Independent                                          Cognition Arousal/Alertness:  Awake/alert Behavior During Therapy: WFL for tasks assessed/performed Overall Cognitive Status: Within Functional Limits for tasks assessed                                        Exercises Total Joint Exercises Ankle Circles/Pumps: Both;10 reps;Supine;AROM Quad Sets: AROM;Both;5 reps;Supine Short Arc Quad: AAROM;Right;5 reps;Supine Heel Slides: AAROM;Right;Supine;5 reps Hip ABduction/ADduction: AAROM;Right;5 reps;Supine Straight Leg Raises: AAROM;Right;5 reps;Supine Long Arc Quad: AROM;Right;5 reps;Seated Knee Flexion: AAROM;Right;5 reps;Seated Goniometric ROM: AAROM R knee ~0-50*    General Comments        Pertinent Vitals/Pain Pain Assessment: 0-10 Pain Score: 7  Pain Location: R knee with activity Pain Descriptors / Indicators: Sore Pain Intervention(s): Limited activity within patient's tolerance;Monitored during session;Premedicated before session;Ice applied    Home Living Family/patient expects to be discharged to:: Private residence Living Arrangements: Spouse/significant other Available Help at Discharge: Family Type of Home: House Home Access: Stairs to enter Entrance Stairs-Rails: Right;Left Home Layout: One level Home Equipment: None      Prior Function Level of Independence: Independent          PT Goals (current goals can now be found in the care plan section) Acute Rehab PT Goals Patient Stated Goal: walk in parks PT Goal Formulation: With patient/family Time For Goal Achievement: 11/26/20 Potential to Achieve Goals: Good Progress towards PT goals: Progressing toward goals    Frequency    7X/week  PT Plan      Co-evaluation              AM-PAC PT "6 Clicks" Mobility   Outcome Measure  Help needed turning from your back to your side while in a flat bed without using bedrails?: None Help needed moving from lying on your back to sitting on the side of a flat bed without using bedrails?: A Little Help needed  moving to and from a bed to a chair (including a wheelchair)?: None Help needed standing up from a chair using your arms (e.g., wheelchair or bedside chair)?: None Help needed to walk in hospital room?: None Help needed climbing 3-5 steps with a railing? : A Little 6 Click Score: 22    End of Session Equipment Utilized During Treatment: Gait belt Activity Tolerance: Patient tolerated treatment well Patient left: in chair;with call bell/phone within reach;with family/visitor present Nurse Communication: Mobility status PT Visit Diagnosis: Muscle weakness (generalized) (M62.81);Difficulty in walking, not elsewhere classified (R26.2);Pain Pain - Right/Left: Right Pain - part of body: Knee     Time: 8185-9093 PT Time Calculation (min) (ACUTE ONLY): 29 min  Charges:  $Gait Training: 8-22 mins $Therapeutic Exercise: 8-22 mins                     [name] PT 11/19/2020  Acute Rehabilitation Services Pager [phone] Office [phone]

## 2020-11-19 NOTE — Evaluation (Signed)
Physical Therapy Evaluation Patient Details Name: [name] MRN: [id number] DOB: [birth year] Today's Date: 11/19/2020   History of Present Illness  64 y.o. male admitted 11/18/20 for R TKA. PMH includes L TKA, ankle surgery, lumbar laminectomy.  Clinical Impression  Pt is s/p TKA resulting in the deficits listed below (see PT Problem List). Pt ambulated 120' with RW, distance limited by 8/10 pain in R knee. Will see pt this afternoon for stair training and instruction in HEP. Good progress expected.  Pt will benefit from skilled PT to increase their independence and safety with mobility to allow discharge to the venue listed below.      Follow Up Recommendations Follow surgeons recommendation for DC plan and follow-up therapies    Equipment Recommendations  Rolling walker with 5" wheels;3in1 (PT)    Recommendations for Other Services       Precautions / Restrictions Precautions Precautions: Knee Precaution Booklet Issued: Yes (comment) Precaution Comments: reviewed no pillow under knee Restrictions Weight Bearing Restrictions: No RLE Weight Bearing: Weight bearing as tolerated      Mobility  Bed Mobility Overal bed mobility: Modified Independent             General bed mobility comments: used rail, HOB up    Transfers Overall transfer level: Needs assistance Equipment used: Rolling walker (2 wheeled) Transfers: Sit to/from Stand Sit to Stand: Min assist         General transfer comment: VCs hand placement, min A to power up  Ambulation/Gait Ambulation/Gait assistance: Min guard Gait Distance (Feet): 120 Feet Assistive device: Rolling walker (2 wheeled) Gait Pattern/deviations: Step-to pattern;Decreased stride length Gait velocity: decr   General Gait Details: VCs sequencing, no loss of balance, distance limited by 8/10 pain  Stairs            Wheelchair Mobility    Modified Rankin (Stroke Patients Only)       Balance Overall balance  assessment: Modified Independent                                           Pertinent Vitals/Pain Pain Assessment: 0-10 Pain Score: 8  Pain Location: R knee Pain Descriptors / Indicators: Sore Pain Intervention(s): Limited activity within patient's tolerance;Monitored during session;Premedicated before session;Ice applied;Patient requesting pain meds-RN notified    Home Living Family/patient expects to be discharged to:: Private residence Living Arrangements: Spouse/significant other Available Help at Discharge: Family Type of Home: House Home Access: Stairs to enter Entrance Stairs-Rails: Psychiatric nurse of Steps: 4 Home Layout: One level Home Equipment: None      Prior Function Level of Independence: Independent               Hand Dominance        Extremity/Trunk Assessment   Upper Extremity Assessment Upper Extremity Assessment: Overall WFL for tasks assessed    Lower Extremity Assessment Lower Extremity Assessment: RLE deficits/detail RLE Deficits / Details: 0-50* AAROM R knee RLE Sensation: WNL RLE Coordination: WNL    Cervical / Trunk Assessment Cervical / Trunk Assessment: Normal  Communication   Communication: No difficulties  Cognition Arousal/Alertness: Awake/alert Behavior During Therapy: WFL for tasks assessed/performed Overall Cognitive Status: Within Functional Limits for tasks assessed  General Comments      Exercises Total Joint Exercises Ankle Circles/Pumps: AROM;Both;10 reps;Supine Quad Sets: AROM;Both;5 reps;Supine   Assessment/Plan    PT Assessment Patient needs continued PT services  PT Problem List Decreased strength;Decreased range of motion;Decreased activity tolerance;Decreased mobility;Pain       PT Treatment Interventions DME instruction;Gait training;Stair training;Therapeutic exercise;Therapeutic activities;Patient/family  education    PT Goals (Current goals can be found in the Care Plan section)  Acute Rehab PT Goals Patient Stated Goal: walk in parks PT Goal Formulation: With patient/family Time For Goal Achievement: 11/26/20 Potential to Achieve Goals: Good    Frequency 7X/week   Barriers to discharge        Co-evaluation               AM-PAC PT "6 Clicks" Mobility  Outcome Measure Help needed turning from your back to your side while in a flat bed without using bedrails?: A Little Help needed moving from lying on your back to sitting on the side of a flat bed without using bedrails?: A Little Help needed moving to and from a bed to a chair (including a wheelchair)?: A Little Help needed standing up from a chair using your arms (e.g., wheelchair or bedside chair)?: A Little Help needed to walk in hospital room?: A Little Help needed climbing 3-5 steps with a railing? : A Lot 6 Click Score: 17    End of Session Equipment Utilized During Treatment: Gait belt Activity Tolerance: Patient tolerated treatment well Patient left: in chair;with call bell/phone within reach;with family/visitor present Nurse Communication: Mobility status PT Visit Diagnosis: Muscle weakness (generalized) (M62.81);Difficulty in walking, not elsewhere classified (R26.2);Pain Pain - Right/Left: Right Pain - part of body: Knee    Time: 1975-8832 PT Time Calculation (min) (ACUTE ONLY): 30 min   Charges:   PT Evaluation $PT Eval Low Complexity: 1 Low PT Treatments $Gait Training: 8-22 mins        Blondell Reveal Kistler PT 11/19/2020  Acute Rehabilitation Services Pager [phone] Office [phone]

## 2020-11-19 NOTE — TOC Transition Note (Signed)
Transition of Care [facility]) - CM/SW Discharge Note   Patient Details  Name: [name] MRN: [id number] Date of Birth: 1956-04-19  Transition of [facility]) CM/SW Contact:  Lia Hopping, [place] Phone Number: 11/19/2020, 10:23 AM   Clinical Narrative:    Prearranged Therapy Plan: OPPT [employer] delivered a RW and 3 in1 to the patient bedside.   Final next level of care: OP Rehab Barriers to Discharge: Barriers Resolved   Patient Goals and CMS Choice        Discharge Placement                       Discharge Plan and Services                DME Arranged: 3-N-1,Walker rolling DME Agency: [employer] Date DME Agency Contacted: 11/19/20 Time DME Agency Contacted: [id number] Representative spoke with at DME Agency: [place] ([place]) Interventions     Readmission Risk Interventions No flowsheet data found.

## 2020-11-19 NOTE — Progress Notes (Signed)
     Subjective: 1 Day Post-Op Procedure(s) (LRB): TOTAL KNEE ARTHROPLASTY (Right)   Seen by Dr. Alvan Dame. Patient reports pain as mild, pain controlled. No reported events throughout the night.  Dr. Alvan Dame discussed the procedure, findings and expectations moving forward.  Ready to be discharged home, if they do well with PT.  Follow up in the clinic in 2 weeks.  Knows to call with any questions or concerns.     Patient's anticipated LOS is less than 2 midnights, meeting these requirements: - Lives within 1 hour of care - Has a competent adult at home to recover with post-op recover - NO history of  - Chronic pain requiring opiods  - Heart failure  - Heart attack  - Stroke  - Cardiac arrhythmia  - Respiratory Failure/COPD  - Renal failure  - Anemia  - Advanced Liver disease       Objective:   VITALS:   Vitals:   11/19/20 0137 11/19/20 0517  BP: 115/78 (!) 113/94  Pulse: (!) 49 (!) 45  Resp: 16 16  Temp: 97.8 F (36.6 C) 97.6 F (36.4 C)  SpO2: 96% 99%    Dorsiflexion/Plantar flexion intact Incision: dressing C/D/I No cellulitis present Compartment soft  LABS Recent Labs    11/19/20 0318  HGB 10.8*  HCT 35.3*  WBC 11.7*  PLT 168    Recent Labs    11/19/20 0318  NA 135  K 4.5  BUN 30*  CREATININE 1.31*  GLUCOSE 202*     Assessment/Plan: 1 Day Post-Op Procedure(s) (LRB): TOTAL KNEE ARTHROPLASTY (Right) Foley cath d/c'ed Advance diet Up with therapy D/C IV fluids Discharge home with home health  Follow up in 2 weeks at [facility] Follow up with OLIN,Erendira Crabtree D in 2 weeks.  Contact information:  [facility] [address], [address] [place] [phone]    Obese (BMI 30-39.9) Estimated body mass index is 34.61 kg/m as calculated from the following:   Height as of this encounter: 6\' 2"  (1.88 m).   Weight as of this encounter: 122.3 kg. Patient also counseled that weight may inhibit the healing process Patient  counseled that losing weight will help with future health issues        Danae Orleans PA-C  [facility]  [place] [address], [address], [place], [place] [address] Phone: [phone] [email or web address] Facebook  [employer]

## 2020-11-19 NOTE — Progress Notes (Signed)
ANTICOAGULATION CONSULT NOTE - Initial Consult  Pharmacy Consult for Warfarin Indication: Hx of pulmonary embolus  Allergies  Allergen Reactions  . Adhesive [Tape] Other (See Comments)    Painful, Please use "paper" tape  . Other     NO BLOOD -JEHOVAH'S WITNESS-SIGNED REFUSAL BUT WOULD TAKE ALBUMIN    Patient Measurements: Height: 6\' 2"  (188 cm) Weight: 122.3 kg (269 lb 9 oz) IBW/kg (Calculated) : 82.2  Vital Signs: Temp: 97.6 F (36.4 C) (12/15 0517) Temp Source: Oral (12/15 0517) BP: 113/94 (12/15 0517) Pulse Rate: 45 (12/15 0517)  Labs: Recent Labs    11/18/20 1013 11/19/20 0318  HGB  --  10.8*  HCT  --  35.3*  PLT  --  168  APTT 32  --   LABPROT  --  13.7  INR  --  1.1  CREATININE  --  1.31*    Estimated Creatinine Clearance: 77 mL/min (A) (by C-G formula based on SCr of 1.31 mg/dL (H)).   Medical History: Past Medical History:  Diagnosis Date  . Arthritis   . BACK PAIN   . CAD in native artery    a. moderate by cardiac CT 2016.  Marland Kitchen DEGENERATIVE JOINT DISEASE, KNEES, BILATERAL   . Depression   . Diabetes mellitus    type II  . DIVERTICULOSIS, COLON   . DVT, lower extremity ([place])    right  . History of kidney stones   . Hyperlipidemia   . Hypertension   . HYPOGONADISM, MALE   . HYPOTENSION, ORTHOSTATIC   . Kidney stones   . Long term current use of anticoagulant   . Obesity   . Pulmonary emboli ([place])   . Renal insufficiency    a. prior h/o, does not appear chronic.    Medications:  Scheduled:  . atorvastatin  80 mg Oral Q supper  . Chlorhexidine Gluconate Cloth  6 each Topical Daily  . chlorthalidone  25 mg Oral Daily  . dexamethasone (DECADRON) injection  10 mg Intravenous Once  . docusate sodium  100 mg Oral BID  . enoxaparin  120 mg Subcutaneous BID  . escitalopram  10 mg Oral Daily  . ferrous sulfate  325 mg Oral BID WC  . glipiZIDE  2.5 mg Oral Q breakfast  . insulin aspart  0-15 Units Subcutaneous TID WC  . metFORMIN  1,000 mg  Oral Q breakfast  . pantoprazole  40 mg Oral Daily  . polyethylene glycol  17 g Oral BID   Infusions:  . sodium chloride 75 mL/hr at 11/19/20 0242  . methocarbamol (ROBAXIN) IV      Assessment: 62 yoM admitted on 12/14 for total knee arthroplasty.  PMH includes PE on chronic warfarin anticoagulation. PTA warfarin dose is 10mg  daily except for 15 mg on Tuesdays  Cindy Boyd,RN who manages the patient's coumadin, wrote a progress note on 11/04/20 with the following plan: 12/9 - Last dose of warfarin until after surgery 12/10 - Nothing (No warfarin and No Lovenox) 12/11 - Lovenox in the AM and PM (12 hours apart) 12/12 - Lovenox in the AM and PM 12/13 - Lovenox in the AM only! (PLEASE TAKE BY 7 AM ) 12/14 - Procedure (No Lovenox today) 12/15 - Lovenox in the AM and PM and 1 1/2 tablets of warfarin (15 mg) 12/16 - Lovenox in the AM and PM and 1 1/2 tablets of warfarin (15 mg) 12/17 - Lovenox in the AM and PM and 1 1/2 tablets of warfarin (15 mg) 12/18 -  Lovenox in the AM and PM and 1 1/2 tablets of warfarin (15 mg) 12/19- Stop Lovenox and continue warfarin 10 mg daily except 15 mg on Tuesdays and please have [place] check INR and report to [facility]   Today, 11/19/20  Hgb 10.8, down after procedure  Plt 168, down after procedure  INR 1.1   Goal of Therapy:  INR 2-3 Monitor platelets by anticoagulation protocol: Yes   Plan:   Warfarin 15 mg PO x 1   Enoxaparin 120 mg SQ q12h   Daily PT/INR.  Monitor for signs and symptoms of bleeding.  Pharmacy will continue to monitor.  Royetta Asal, PharmD, BCPS 11/19/2020 7:45 AM

## 2020-11-20 ENCOUNTER — Encounter (HOSPITAL_COMMUNITY): Payer: Self-pay | Admitting: Orthopedic Surgery

## 2020-11-20 ENCOUNTER — Ambulatory Visit: Payer: PPO

## 2020-11-20 NOTE — Discharge Summary (Signed)
Patient ID: [name] MRN: [id number] DOB/AGE: Apr 19, 1956 64 y.o.  Admit date: 11/18/2020 Discharge date: 11/20/2020  Admission Diagnoses:  Principal Problem:   Osteoarthritis of right knee Active Problems:   S/P right TKA   Discharge Diagnoses:  Same  Past Medical History:  Diagnosis Date   Arthritis    BACK PAIN    CAD in native artery    a. moderate by cardiac CT 2016.   DEGENERATIVE JOINT DISEASE, KNEES, BILATERAL    Depression    Diabetes mellitus    type II   DIVERTICULOSIS, COLON    DVT, lower extremity ([place])    right   History of kidney stones    Hyperlipidemia    Hypertension    HYPOGONADISM, MALE    HYPOTENSION, ORTHOSTATIC    Kidney stones    Long term current use of anticoagulant    Obesity    Pulmonary emboli ([facility])    Renal insufficiency    a. prior h/o, does not appear chronic.    Surgeries: Procedure(s): RIGHT TOTAL KNEE ARTHROPLASTY on 11/18/2020   Consultants: N/A  Discharged Condition: Improved  Hospital Course: [name] is an 64 y.o. male who was admitted 11/18/2020 for operative treatment ofOsteoarthritis of right knee. Patient has severe unremitting pain that affects sleep, daily activities, and work/hobbies. After pre-op clearance the patient was taken to the operating room on 11/18/2020 and underwent  Procedure(s): RIGHT TOTAL KNEE ARTHROPLASTY.    Patient was given perioperative antibiotics:  Anti-infectives (From admission, onward)   Start     Dose/Rate Route Frequency Ordered Stop   11/18/20 1800  ceFAZolin (ANCEF) IVPB 2g/100 mL premix        2 g 200 mL/hr over 30 Minutes Intravenous Every 6 hours 11/18/20 1513 11/19/20 0035   11/18/20 0600  ceFAZolin (ANCEF) 3 g in dextrose 5 % 50 mL IVPB        3 g 100 mL/hr over 30 Minutes Intravenous On call to O.R. 11/17/20 9924 11/18/20 1251       Patient was given sequential compression devices, early ambulation, and chemoprophylaxis to prevent  DVT.  Patient benefited maximally from hospital stay and there were no complications.    Recent vital signs: No data found.   Recent laboratory studies:  Recent Labs    11/19/20 0318  WBC 11.7*  HGB 10.8*  HCT 35.3*  PLT 168  NA 135  K 4.5  CL 102  CO2 23  BUN 30*  CREATININE 1.31*  GLUCOSE 202*  INR 1.1  CALCIUM 8.4*     Discharge Medications:   Allergies as of 11/19/2020      Reactions   Adhesive [tape] Other (See Comments)   Painful, Please use "paper" tape   Other    NO BLOOD -JEHOVAH'S WITNESS-SIGNED REFUSAL BUT WOULD TAKE ALBUMIN      Medication List    TAKE these medications   artificial tears ointment Place 1 drop into both eyes every 4 (four) hours as needed (dry eyes).   atorvastatin 80 MG tablet Commonly known as: LIPITOR Take 1 tablet (80 mg total) by mouth daily. What changed: when to take this   CALCI-CHEW PO Take 1 tablet by mouth daily.   chlorthalidone 25 MG tablet Commonly known as: HYGROTON TAKE 1 TABLET BY MOUTH EVERY DAY   docusate sodium 100 MG capsule Commonly known as: Colace Take 1 capsule (100 mg total) by mouth 2 (two) times daily.   enoxaparin 120 MG/0.8ML injection Commonly known as:  LOVENOX Inject 0.8 mLs (120 mg total) into the skin every 12 (twelve) hours for 2 doses.   escitalopram 10 MG tablet Commonly known as: LEXAPRO TAKE 1 TABLET BY MOUTH EVERY DAY   ferrous sulfate 325 (65 FE) MG tablet Commonly known as: FerrouSul Take 1 tablet (325 mg total) by mouth 3 (three) times daily with meals for 14 days.   fluticasone 50 MCG/ACT nasal spray Commonly known as: FLONASE Place 2 sprays into both nostrils daily as needed for allergies.   glipiZIDE 2.5 MG 24 hr tablet Commonly known as: GLUCOTROL XL Take 1 tablet (2.5 mg total) by mouth daily with breakfast.   HYDROcodone-acetaminophen 7.5-325 MG tablet Commonly known as: Norco Take 1-2 tablets by mouth every 4 (four) hours as needed for moderate pain.   IRON  PO Take 1 tablet by mouth daily with breakfast.   metFORMIN 500 MG tablet Commonly known as: GLUCOPHAGE Take 4 tablets (2,000 mg total) by mouth daily with breakfast. What changed: how much to take   methocarbamol 500 MG tablet Commonly known as: Robaxin Take 1 tablet (500 mg total) by mouth every 6 (six) hours as needed for muscle spasms.   multivitamin with minerals Tabs tablet Take 1 tablet by mouth daily.   nitroGLYCERIN 0.4 MG SL tablet Commonly known as: NITROSTAT Place 1 tablet (0.4 mg total) under the tongue every 5 (five) minutes as needed for chest pain.   pantoprazole 40 MG tablet Commonly known as: Protonix Take 1 tablet (40 mg total) by mouth daily.   polyethylene glycol 17 g packet Commonly known as: MIRALAX / GLYCOLAX Take 17 g by mouth 2 (two) times daily.   SENNA CO Take 1 tablet by mouth daily. As needed for constipation.   tamsulosin 0.4 MG Caps capsule Commonly known as: FLOMAX Take 1 capsule (0.4 mg total) by mouth daily after supper.   VITAMIN B-12 SL Place 1 tablet under the tongue daily.   Vitamin D3 125 MCG (5000 UT) Caps Take 5,000 Units by mouth daily.   warfarin 10 MG tablet Commonly known as: COUMADIN Take as directed. If you are unsure how to take this medication, talk to your nurse or doctor. Original instructions: TAKE 1 TABLET DAILY EXCEPT 2 TABLETS ON WED OR TAKE AS DIRECTED BY ANTICOAGULATION CLINIC What changed: See the new instructions.            Discharge Care Instructions  (From admission, onward)         Start     Ordered   11/19/20 0000  Change dressing       Comments: Maintain surgical dressing until follow up in the clinic. If the edges start to pull up, may reinforce with tape. If the dressing is no longer working, may remove and cover with gauze and tape, but must keep the area dry and clean.  Call with any questions or concerns.   11/19/20 0924          Diagnostic Studies: No results found.  Disposition:  HOME  Discharge Instructions    Call MD / Call 911   Complete by: As directed    If you experience chest pain or shortness of breath, CALL 911 and be transported to the hospital emergency room.  If you develope a fever above 101 F, pus (white drainage) or increased drainage or redness at the wound, or calf pain, call your surgeon's office.   Change dressing   Complete by: As directed    Maintain surgical dressing until  follow up in the clinic. If the edges start to pull up, may reinforce with tape. If the dressing is no longer working, may remove and cover with gauze and tape, but must keep the area dry and clean.  Call with any questions or concerns.   Constipation Prevention   Complete by: As directed    Drink plenty of fluids.  Prune juice may be helpful.  You may use a stool softener, such as Colace (over the counter) 100 mg twice a day.  Use MiraLax (over the counter) for constipation as needed.   Diet - low sodium heart healthy   Complete by: As directed    Discharge instructions   Complete by: As directed    Maintain surgical dressing until follow up in the clinic. If the edges start to pull up, may reinforce with tape. If the dressing is no longer working, may remove and cover with gauze and tape, but must keep the area dry and clean.  Follow up in 2 weeks at [facility]. Call with any questions or concerns.   Increase activity slowly as tolerated   Complete by: As directed    Weight bearing as tolerated with assist device (walker, cane, etc) as directed, use it as long as suggested by your surgeon or therapist, typically at least 4-6 weeks.   TED hose   Complete by: As directed    Use stockings (TED hose) for 2 weeks on both leg(s).  You may remove them at night for sleeping.       Follow-up Information    Paralee Cancel, MD. Schedule an appointment as soon as possible for a visit in 2 weeks.   Specialty: Orthopedic Surgery Contact information: [address] [place]  [place] [address] [phone]                Signed: Lucille Passy [facility] 11/20/2020, 2:44 PM

## 2020-11-24 DIAGNOSIS — M25561 Pain in right knee: Secondary | ICD-10-CM | POA: Diagnosis not present

## 2020-11-27 DIAGNOSIS — M25561 Pain in right knee: Secondary | ICD-10-CM | POA: Diagnosis not present

## 2020-12-01 DIAGNOSIS — M25561 Pain in right knee: Secondary | ICD-10-CM | POA: Diagnosis not present

## 2020-12-02 ENCOUNTER — Ambulatory Visit (INDEPENDENT_AMBULATORY_CARE_PROVIDER_SITE_OTHER): Payer: PPO | Admitting: General Practice

## 2020-12-02 ENCOUNTER — Other Ambulatory Visit: Payer: Self-pay

## 2020-12-02 DIAGNOSIS — Z7901 Long term (current) use of anticoagulants: Secondary | ICD-10-CM

## 2020-12-02 LAB — POCT INR: INR: 3.2 — AB (ref 2.0–3.0)

## 2020-12-02 NOTE — Patient Instructions (Addendum)
Pre visit review using our clinic review tool, if applicable. No additional management support is needed unless otherwise documented below in the visit note.  Skip dosage tomorrow (12/29) and then continue to take 1 tablet daily except 1 1/2 tablets every Tuesday.  Re-check in 4 weeks.

## 2020-12-04 ENCOUNTER — Telehealth (INDEPENDENT_AMBULATORY_CARE_PROVIDER_SITE_OTHER): Payer: PPO | Admitting: Family Medicine

## 2020-12-04 DIAGNOSIS — R5383 Other fatigue: Secondary | ICD-10-CM

## 2020-12-04 DIAGNOSIS — R251 Tremor, unspecified: Secondary | ICD-10-CM | POA: Diagnosis not present

## 2020-12-04 NOTE — Progress Notes (Signed)
Virtual Visit via Telephone Note  I connected with [name] on 12/04/20 at 12:20 PM EST by telephone and verified that I am speaking with the correct person using two identifiers.   I discussed the limitations, risks, security and privacy concerns of performing an evaluation and management service by telephone and the availability of in person appointments. I also discussed with the patient that there may be a patient responsible charge related to this service. The patient expressed understanding and agreed to proceed.  Location patient: home, [place] Location provider: work or home office Participants present for the call: patient, provider, wife Patient did not have a visit with me in the prior 7 days to address this/these issue(s).   History of Present Illness:  Acute telemedicine visit for fatigue and weakness: -Onset: reports 11/18/20 he had surgery on the knee and right after that starting a bunch of pain medications and then started having these symptoms  -Symptoms include: "low energy" whenever up and around, report "I feel shaky" - he is not sure if this from the knee or the medications, saw his surgeon yesterday and they told him that it just takes time to recover after surgery -Denies: CP, SOB, fevers, NVD, falls, HA, cough, resp symptoms, palpitations, inability to ambulate or move - just feels tire, is tolerating oral intake -Has tried:  -Pertinent past medical history: complicated PMH w/ hx diabetes, RI, OSA, HTN, PE on coumadin (supratherapeutic on recent check w/ his coumadin clinic), CAD, RBBB -Pertinent medication allergies:see chart    Observations/Objective: Patient sounds cheerful and well on the phone. I do not appreciate any SOB. Speech and thought processing are grossly intact. Patient reported vitals:  Assessment and Plan:  Fatigue, unspecified type  Shakiness  -we discussed possible serious and likely etiologies, options for evaluation and workup,  limitations of telemedicine visit vs in person visit, treatment, treatment risks and precautions. Pt prefers to treat via telemedicine empirically rather than in person at this moment.  Complicated past medical history, recent surgery, on significant doses of narcotics and now with concerning symptoms.  Did discuss this could possibly simply be from recovery from surgery and side effects to his medications, however advised close follow-up in person to do a good exam and possibly labs to rule out other potential causes of his symptoms.  He agrees to contact his PCP office after this visit to schedule close follow-up in person.  In the interim advised staying hydrated, good diet, caution with ambulation to prevent falls, and seeking immediate in person care elsewhere if PCP office is not available or if any worsening.    Advised of options for inperson care in case PCP office not available. Did let the patient know that I only do telemedicine shifts for [place] on Tuesdays and Thursdays and advised a follow up visit with PCP or at an [facility] if has further questions or concerns.   Follow Up Instructions:  I did not refer this patient for an OV with me in the next 24 hours for this/these issue(s).  I discussed the assessment and treatment plan with the patient. The patient was provided an opportunity to ask questions and all were answered. The patient agreed with the plan and demonstrated an understanding of the instructions.   I spent 17 minutes on the date of this visit in the care of this patient. See summary of tasks completed to properly care for this patient in the detailed notes above which included previsit review of some records, review  of PMH, medications, allergies, evaluation of the patient per conversation and instructing patient on testing and care options.     Terressa Koyanagi, DO

## 2020-12-12 ENCOUNTER — Encounter: Payer: Self-pay | Admitting: Internal Medicine

## 2020-12-12 ENCOUNTER — Other Ambulatory Visit: Payer: Self-pay

## 2020-12-12 ENCOUNTER — Ambulatory Visit (INDEPENDENT_AMBULATORY_CARE_PROVIDER_SITE_OTHER): Payer: HMO | Admitting: Internal Medicine

## 2020-12-12 VITALS — BP 128/84 | HR 82 | Temp 98.2°F | Ht 74.0 in | Wt 256.0 lb

## 2020-12-12 DIAGNOSIS — N183 Chronic kidney disease, stage 3 unspecified: Secondary | ICD-10-CM

## 2020-12-12 DIAGNOSIS — Z96651 Presence of right artificial knee joint: Secondary | ICD-10-CM | POA: Diagnosis not present

## 2020-12-12 DIAGNOSIS — E1122 Type 2 diabetes mellitus with diabetic chronic kidney disease: Secondary | ICD-10-CM | POA: Diagnosis not present

## 2020-12-12 DIAGNOSIS — D62 Acute posthemorrhagic anemia: Secondary | ICD-10-CM | POA: Diagnosis not present

## 2020-12-12 DIAGNOSIS — I10 Essential (primary) hypertension: Secondary | ICD-10-CM

## 2020-12-12 DIAGNOSIS — E782 Mixed hyperlipidemia: Secondary | ICD-10-CM

## 2020-12-12 LAB — HEPATIC FUNCTION PANEL
ALT: 19 U/L (ref 0–53)
AST: 15 U/L (ref 0–37)
Albumin: 4.2 g/dL (ref 3.5–5.2)
Alkaline Phosphatase: 83 U/L (ref 39–117)
Bilirubin, Direct: 0.2 mg/dL (ref 0.0–0.3)
Total Bilirubin: 0.8 mg/dL (ref 0.2–1.2)
Total Protein: 7.4 g/dL (ref 6.0–8.3)

## 2020-12-12 LAB — CBC WITH DIFFERENTIAL/PLATELET
Basophils Absolute: 0 10*3/uL (ref 0.0–0.1)
Basophils Relative: 0.4 % (ref 0.0–3.0)
Eosinophils Absolute: 0.1 10*3/uL (ref 0.0–0.7)
Eosinophils Relative: 1.2 % (ref 0.0–5.0)
HCT: 34.5 % — ABNORMAL LOW (ref 39.0–52.0)
Hemoglobin: 10.8 g/dL — ABNORMAL LOW (ref 13.0–17.0)
Lymphocytes Relative: 20 % (ref 12.0–46.0)
Lymphs Abs: 2.1 10*3/uL (ref 0.7–4.0)
MCHC: 31.4 g/dL (ref 30.0–36.0)
MCV: 70.8 fl — ABNORMAL LOW (ref 78.0–100.0)
Monocytes Absolute: 0.8 10*3/uL (ref 0.1–1.0)
Monocytes Relative: 7.2 % (ref 3.0–12.0)
Neutro Abs: 7.6 10*3/uL (ref 1.4–7.7)
Neutrophils Relative %: 71.2 % (ref 43.0–77.0)
Platelets: 418 10*3/uL — ABNORMAL HIGH (ref 150.0–400.0)
RBC: 4.87 Mil/uL (ref 4.22–5.81)
RDW: 16.2 % — ABNORMAL HIGH (ref 11.5–15.5)
WBC: 10.7 10*3/uL — ABNORMAL HIGH (ref 4.0–10.5)

## 2020-12-12 LAB — IBC PANEL
Iron: 60 ug/dL (ref 42–165)
Saturation Ratios: 18.9 % — ABNORMAL LOW (ref 20.0–50.0)
Transferrin: 227 mg/dL (ref 212.0–360.0)

## 2020-12-12 LAB — LIPID PANEL
Cholesterol: 134 mg/dL (ref 0–200)
HDL: 36.7 mg/dL — ABNORMAL LOW (ref >39.00)
LDL Cholesterol: 74 mg/dL (ref 0–99)
NonHDL: 96.88
Total CHOL/HDL Ratio: 4
Triglycerides: 115 mg/dL (ref 0.0–149.0)
VLDL: 23 mg/dL (ref 0.0–40.0)

## 2020-12-12 LAB — BASIC METABOLIC PANEL
BUN: 26 mg/dL — ABNORMAL HIGH (ref 6–23)
CO2: 30 mEq/L (ref 19–32)
Calcium: 9.8 mg/dL (ref 8.4–10.5)
Chloride: 98 mEq/L (ref 96–112)
Creatinine, Ser: 1.43 mg/dL (ref 0.40–1.50)
GFR: 50.89 mL/min — ABNORMAL LOW (ref >60.00)
Glucose, Bld: 179 mg/dL — ABNORMAL HIGH (ref 70–99)
Potassium: 4 mEq/L (ref 3.5–5.1)
Sodium: 135 mEq/L (ref 135–145)

## 2020-12-12 LAB — HEMOGLOBIN A1C: Hgb A1c MFr Bld: 7.3 % — ABNORMAL HIGH (ref 4.6–6.5)

## 2020-12-12 NOTE — Patient Instructions (Signed)
Please continue all other medications as before, and refills have been done if requested.  Please have the pharmacy call with any other refills you may need.  Please continue your efforts at being more active, low cholesterol diet, and weight control  Please keep your appointments with your specialists as you may have planned  Please go to the LAB at the blood drawing area for the tests to be done  You will be contacted by phone if any changes need to be made immediately.  Otherwise, you will receive a letter about your results with an explanation, but please check with MyChart first.  Please remember to sign up for MyChart if you have not done so, as this will be important to you in the future with finding out test results, communicating by private email, and scheduling acute appointments online when needed.  Please make an Appointment to return in 3 months

## 2020-12-12 NOTE — Progress Notes (Signed)
Established Patient Office Visit  Subjective:  Patient ID: [name], male    DOB: 13-Feb-1956  Age: 65 y.o. MRN: [id number]      Chief Complaint: (concise statement describing the symptom, problem, condition, diagnosis, physician recommended return, or other factor as reason for encounter) follow up HTN, HLD and hyperglycemia and right knee pain       HPI:  [name] is a 65 y.o. male here to f/u above; Pt denies chest pain, increased sob or doe, wheezing, orthopnea, PND, increased LE swelling, palpitations, dizziness or syncope.  Pt denies new neurological symptoms such as new headache, or facial or extremity weakness or numbness   Pt denies polydipsia, polyuria, or low sugar symptoms such as weakness or confusion improved with po intake.  Pt states overall good compliance with meds, trying to follow lower cholesterol, diabetic diet, wt overall stable but little exercise however due to right knee pain.  S/p 2 wks right knee TKA dec 15.  No low sugars but lost significant wt post surgury with low appetite, low energy, low stamina and endurance.  After standing to get to the next room, seems to just want to flop down. Pepto bismol may help somewhat.  Has been started PT outpatient therapy. Last Hg 10.8, trying to drink fluids. No fever.  Pain was severe the first week, Couldn't get the pain controlled despite several changes per ortho, along with muscle relaxer, stool softner, iron supplements.  Has been through the ringer, feeling low, not sure about depression.  No falls post hopsn .  Left knee was much easier in the past, had same orthopedic, went home instead of rehab like the left knee episode.  No overt bleeding back on coumadin. lastINR at 3 last wk.  CBGs 89 - low 100s. Wt Readings from Last 3 Encounters:  12/12/20 256 lb (116.1 kg)  11/18/20 269 lb 9 oz (122.3 kg)  11/11/20 269 lb 9 oz (122.3 kg)   BP Readings from Last 3 Encounters:  12/12/20 128/84  11/19/20 (!) 152/77   11/11/20 135/81         Past Medical History:  Diagnosis Date  . Arthritis   . BACK PAIN   . CAD in native artery    a. moderate by cardiac CT 2016.  Marland Kitchen DEGENERATIVE JOINT DISEASE, KNEES, BILATERAL   . Depression   . Diabetes mellitus    type II  . DIVERTICULOSIS, COLON   . DVT, lower extremity ([place])    right  . History of kidney stones   . Hyperlipidemia   . Hypertension   . HYPOGONADISM, MALE   . HYPOTENSION, ORTHOSTATIC   . Kidney stones   . Long term current use of anticoagulant   . Obesity   . Pulmonary emboli ([place])   . Renal insufficiency    a. prior h/o, does not appear chronic.   Past Surgical History:  Procedure Laterality Date  . ankle surgury  2001 bilateral   with bone spurs and torn tendon  . COLONOSCOPY  03/18/2010  . EDG  2008   chronic Duodenitis  . GASTRIC BYPASS    . HEEL SPUR SURGERY Bilateral 2005  . knee surgury  2000 & 2003    cartilage damage  . LUMBAR LAMINECTOMY/DECOMPRESSION MICRODISCECTOMY  09/28/2012   Procedure: LUMBAR LAMINECTOMY/DECOMPRESSION MICRODISCECTOMY 2 LEVELS;  Surgeon: [name], MD;  Location: [facility];  Service: Orthopedics;  Laterality: Left;  Decompressive laminectomy L4-L5. Microdiscectomy L5-S1  . Skin removal surgery  extra abdominal skin removed from his weight loss  . TONSILLECTOMY AND ADENOIDECTOMY  age 63 or 29  . TOTAL KNEE ARTHROPLASTY  01/10/2012   Procedure: TOTAL KNEE ARTHROPLASTY;  Surgeon: [name], MD;  Location: [facility];  Service: Orthopedics;  Laterality: Left;  . TOTAL KNEE ARTHROPLASTY Right 11/18/2020   Procedure: TOTAL KNEE ARTHROPLASTY;  Surgeon: Paralee Cancel, MD;  Location: [facility];  Service: Orthopedics;  Laterality: Right;  70 mins  . UPPER GASTROINTESTINAL ENDOSCOPY  03/18/2010    reports that he quit smoking about 42 years ago. He has a 1.25 pack-year smoking history. He has never used smokeless tobacco. He reports current alcohol use of about 2.0 standard drinks of alcohol per week.  He reports that he does not use drugs. family history includes Diabetes in his brother, sister, sister, and sister; Heart failure in his sister; Hypertension in his mother; Prostate cancer in his father. Allergies  Allergen Reactions  . Adhesive [Tape] Other (See Comments)    Painful, Please use "paper" tape  . Other     NO BLOOD -JEHOVAH'S WITNESS-SIGNED REFUSAL BUT WOULD TAKE ALBUMIN   Current Outpatient Medications on File Prior to Visit  Medication Sig Dispense Refill  . Artificial Tear Ointment (ARTIFICIAL TEARS) ointment Place 1 drop into both eyes every 4 (four) hours as needed (dry eyes).     Marland Kitchen atorvastatin (LIPITOR) 80 MG tablet Take 1 tablet (80 mg total) by mouth daily. (Patient taking differently: Take 80 mg by mouth daily with supper.) 90 tablet 3  . Calcium Carbonate (CALCI-CHEW PO) Take 1 tablet by mouth daily.     . chlorthalidone (HYGROTON) 25 MG tablet TAKE 1 TABLET BY MOUTH EVERY DAY (Patient taking differently: Take 25 mg by mouth daily.) 90 tablet 1  . Cholecalciferol (VITAMIN D3) 125 MCG (5000 UT) CAPS Take 5,000 Units by mouth daily.     . Cyanocobalamin (VITAMIN B-12 SL) Place 1 tablet under the tongue daily.    Marland Kitchen docusate sodium (COLACE) 100 MG capsule Take 1 capsule (100 mg total) by mouth 2 (two) times daily. 28 capsule 0  . escitalopram (LEXAPRO) 10 MG tablet TAKE 1 TABLET BY MOUTH EVERY DAY (Patient taking differently: Take 10 mg by mouth daily.) 90 tablet 3  . Ferrous Sulfate (IRON PO) Take 1 tablet by mouth daily with breakfast.    . fluticasone (FLONASE) 50 MCG/ACT nasal spray Place 2 sprays into both nostrils daily as needed for allergies.    Marland Kitchen glipiZIDE (GLUCOTROL XL) 2.5 MG 24 hr tablet Take 1 tablet (2.5 mg total) by mouth daily with breakfast. 90 tablet 3  . HYDROcodone-acetaminophen (NORCO) 7.5-325 MG tablet Take 1-2 tablets by mouth every 4 (four) hours as needed for moderate pain. 60 tablet 0  . metFORMIN (GLUCOPHAGE) 500 MG tablet Take 4 tablets (2,000  mg total) by mouth daily with breakfast. (Patient taking differently: Take 1,000 mg by mouth daily with breakfast.) 360 tablet 3  . methocarbamol (ROBAXIN) 500 MG tablet Take 1 tablet (500 mg total) by mouth every 6 (six) hours as needed for muscle spasms. 40 tablet 0  . Multiple Vitamin (MULTIVITAMIN WITH MINERALS) TABS tablet Take 1 tablet by mouth daily.    . nitroGLYCERIN (NITROSTAT) 0.4 MG SL tablet Place 1 tablet (0.4 mg total) under the tongue every 5 (five) minutes as needed for chest pain. 90 tablet 3  . pantoprazole (PROTONIX) 40 MG tablet Take 1 tablet (40 mg total) by mouth daily. 30 tablet 11  . polyethylene  glycol (MIRALAX / GLYCOLAX) 17 g packet Take 17 g by mouth 2 (two) times daily. 28 packet 0  . SENNA CO Take 1 tablet by mouth daily. As needed for constipation.    . tamsulosin (FLOMAX) 0.4 MG CAPS capsule Take 1 capsule (0.4 mg total) by mouth daily after supper. 15 capsule 0  . warfarin (COUMADIN) 10 MG tablet TAKE 1 TABLET DAILY EXCEPT 2 TABLETS ON WED OR TAKE AS DIRECTED BY ANTICOAGULATION CLINIC (Patient taking differently: Take 10-15 mg by mouth See admin instructions. Take 10mg  (1 tablet) daily except on Tuesdays take 15mg  (1.5 tablets) OR TAKE AS DIRECTED BY ANTICOAGULATION CLINIC) 102 tablet 1  . ferrous sulfate (FERROUSUL) 325 (65 FE) MG tablet Take 1 tablet (325 mg total) by mouth 3 (three) times daily with meals for 14 days. 42 tablet 0   No current facility-administered medications on file prior to visit.        ROS:  All others reviewed and negative.  Objective        PE:  BP 128/84   Pulse 82   Temp 98.2 F (36.8 C) (Oral)   Ht 6\' 2"  (1.88 m)   Wt 256 lb (116.1 kg)   SpO2 97%   BMI 32.87 kg/m                 Constitutional: Pt appears in NAD               HENT: Head: NCAT.                Right Ear: External ear normal.                 Left Ear: External ear normal.                Eyes: . Pupils are equal, round, and reactive to light. Conjunctivae and  EOM are normal               Nose: without d/c or deformity               Neck: Neck supple. Gross normal ROM               Cardiovascular: Normal rate and regular rhythm.                 Pulmonary/Chest: Effort normal and breath sounds without rales or wheezing.                Abd:  Soft, NT, ND, + BS, no organomegaly               Neurological: Pt is alert. At baseline orientation, motor grossly intact               Skin: Skin is warm. No rashes, no other new lesions, LE edema - trace biat               Psychiatric: Pt behavior is normal without agitation   Assessment/Plan:  [name] is a 65 y.o. Black or African American [2] male with  has a past medical history of Arthritis, BACK PAIN, CAD in native artery, DEGENERATIVE JOINT DISEASE, KNEES, BILATERAL, Depression, Diabetes mellitus, DIVERTICULOSIS, COLON, DVT, lower extremity ([place]), History of kidney stones, Hyperlipidemia, Hypertension, HYPOGONADISM, MALE, HYPOTENSION, ORTHOSTATIC, Kidney stones, Long term current use of anticoagulant, Obesity, Pulmonary emboli ([place]), and Renal insufficiency.   Assessment Plan  See problem oriented assessment and plan Labs reviewed for each problem: Lab Results  Component  Value Date   WBC 10.7 (H) 12/12/2020   HGB 10.8 (L) 12/12/2020   HCT 34.5 (L) 12/12/2020   PLT 418.0 (H) 12/12/2020   GLUCOSE 179 (H) 12/12/2020   CHOL 134 12/12/2020   TRIG 115.0 12/12/2020   HDL 36.70 (L) 12/12/2020   LDLDIRECT 116.0 01/15/2020   LDLCALC 74 12/12/2020   ALT 19 12/12/2020   AST 15 12/12/2020   NA 135 12/12/2020   K 4.0 12/12/2020   CL 98 12/12/2020   CREATININE 1.43 12/12/2020   BUN 26 (H) 12/12/2020   CO2 30 12/12/2020   TSH 2.05 01/15/2020   PSA 2.92 01/15/2020   INR 3.2 (A) 12/02/2020   HGBA1C 7.3 (H) 12/12/2020   MICROALBUR 2.1 (H) 01/15/2020    Micro: none  Cardiac tracings I have personally interpreted today:  none  Pertinent Radiological findings (summarize): none    Health  Maintenance Due  Topic Date Due  . FOOT EXAM  07/12/2020  . URINE MICROALBUMIN  01/14/2021    There are no preventive care reminders to display for this patient.  Lab Results  Component Value Date   TSH 2.05 01/15/2020   Lab Results  Component Value Date   WBC 10.7 (H) 12/12/2020   HGB 10.8 (L) 12/12/2020   HCT 34.5 (L) 12/12/2020   MCV 70.8 (L) 12/12/2020   PLT 418.0 (H) 12/12/2020   Lab Results  Component Value Date   NA 135 12/12/2020   K 4.0 12/12/2020   CO2 30 12/12/2020   GLUCOSE 179 (H) 12/12/2020   BUN 26 (H) 12/12/2020   CREATININE 1.43 12/12/2020   BILITOT 0.8 12/12/2020   ALKPHOS 83 12/12/2020   AST 15 12/12/2020   ALT 19 12/12/2020   PROT 7.4 12/12/2020   ALBUMIN 4.2 12/12/2020   CALCIUM 9.8 12/12/2020   ANIONGAP 10 11/19/2020   GFR 50.89 (L) 12/12/2020   Lab Results  Component Value Date   CHOL 134 12/12/2020   Lab Results  Component Value Date   HDL 36.70 (L) 12/12/2020   Lab Results  Component Value Date   LDLCALC 74 12/12/2020   Lab Results  Component Value Date   TRIG 115.0 12/12/2020   Lab Results  Component Value Date   CHOLHDL 4 12/12/2020   Lab Results  Component Value Date   HGBA1C 7.3 (H) 12/12/2020      Assessment & Plan:   Problem List Items Addressed This Visit      Medium   Hyperlipidemia    Lab Results  Component Value Date   LDLCALC 74 12/12/2020  stable, cont lipitor 80      Essential hypertension    BP Readings from Last 3 Encounters:  12/12/20 128/84  11/19/20 (!) 152/77  11/11/20 135/81  stable, cont current tx with cholorthalidone      Diabetes ([place]) - Primary    Lab Results  Component Value Date   HGBA1C 7.3 (H) 12/12/2020  stable, cont glipizide, metformin      Relevant Orders   Hemoglobin A1c (Completed)   Lipid panel (Completed)   Hepatic function panel (Completed)   CBC with Differential/Platelet (Completed)   Basic metabolic panel (Completed)   Acute blood loss anemia    S/p knee  repalcement for f/u cbc      Relevant Orders   IBC panel (Completed)     Low   S/P right TKA    Pt with persistent pain worse overall than prior experience with left knee replacement, somewhat improved but with  concomitant low mood and signficant loss, to cont ortho f/u, pain control         No orders of the defined types were placed in this encounter.   Follow-up: Return in about 3 months (around 03/12/2021).     Cathlean Cower, MD 12/14/2020 5:22 PM [facility]

## 2020-12-13 ENCOUNTER — Encounter: Payer: Self-pay | Admitting: Internal Medicine

## 2020-12-14 ENCOUNTER — Encounter: Payer: Self-pay | Admitting: Internal Medicine

## 2020-12-14 DIAGNOSIS — D62 Acute posthemorrhagic anemia: Secondary | ICD-10-CM | POA: Insufficient documentation

## 2020-12-14 NOTE — Assessment & Plan Note (Signed)
S/p knee repalcement for f/u cbc

## 2020-12-14 NOTE — Assessment & Plan Note (Signed)
Pt with persistent pain worse overall than prior experience with left knee replacement, somewhat improved but with concomitant low mood and signficant loss, to cont ortho f/u, pain control

## 2020-12-14 NOTE — Assessment & Plan Note (Signed)
Lab Results  Component Value Date   LDLCALC 74 12/12/2020  stable, cont lipitor 80

## 2020-12-14 NOTE — Assessment & Plan Note (Signed)
Lab Results  Component Value Date   HGBA1C 7.3 (H) 12/12/2020  stable, cont glipizide, metformin

## 2020-12-14 NOTE — Assessment & Plan Note (Signed)
BP Readings from Last 3 Encounters:  12/12/20 128/84  11/19/20 (!) 152/77  11/11/20 135/81  stable, cont current tx with cholorthalidone

## 2020-12-16 ENCOUNTER — Ambulatory Visit: Payer: PPO | Admitting: Neurology

## 2020-12-16 DIAGNOSIS — M25561 Pain in right knee: Secondary | ICD-10-CM | POA: Diagnosis not present

## 2020-12-19 DIAGNOSIS — M25561 Pain in right knee: Secondary | ICD-10-CM | POA: Diagnosis not present

## 2020-12-30 ENCOUNTER — Other Ambulatory Visit: Payer: Self-pay

## 2020-12-30 ENCOUNTER — Ambulatory Visit (INDEPENDENT_AMBULATORY_CARE_PROVIDER_SITE_OTHER): Payer: HMO | Admitting: General Practice

## 2020-12-30 DIAGNOSIS — Z7901 Long term (current) use of anticoagulants: Secondary | ICD-10-CM

## 2020-12-30 LAB — POCT INR: INR: 2.1 (ref 2.0–3.0)

## 2020-12-30 NOTE — Patient Instructions (Addendum)
Pre visit review using our clinic review tool, if applicable. No additional management support is needed unless otherwise documented below in the visit note.  Continue to take 1 tablet daily except 1 1/2 tablets every Tuesday.  Re-check in 4 weeks.  

## 2020-12-30 NOTE — Progress Notes (Signed)
Medical screening examination/treatment/procedure(s) were performed by non-physician practitioner and as supervising physician I was immediately available for consultation/collaboration. I agree with above. James John, MD   

## 2021-01-01 ENCOUNTER — Other Ambulatory Visit: Payer: Self-pay | Admitting: Internal Medicine

## 2021-01-01 DIAGNOSIS — Z7901 Long term (current) use of anticoagulants: Secondary | ICD-10-CM

## 2021-01-01 NOTE — Telephone Encounter (Signed)
Please ask pt to request coumadin refill per coumadin clinic

## 2021-01-02 DIAGNOSIS — M25561 Pain in right knee: Secondary | ICD-10-CM | POA: Diagnosis not present

## 2021-01-06 DIAGNOSIS — M25561 Pain in right knee: Secondary | ICD-10-CM | POA: Diagnosis not present

## 2021-01-11 ENCOUNTER — Other Ambulatory Visit: Payer: Self-pay | Admitting: Internal Medicine

## 2021-01-11 NOTE — Telephone Encounter (Signed)
Please refill as per office routine med refill policy (all routine meds refilled for 3 mo or monthly per pt preference up to one year from last visit, then month to month grace period for 3 mo, then further med refills will have to be denied)  

## 2021-01-12 ENCOUNTER — Other Ambulatory Visit: Payer: PPO

## 2021-01-13 DIAGNOSIS — M25561 Pain in right knee: Secondary | ICD-10-CM | POA: Diagnosis not present

## 2021-01-14 DIAGNOSIS — Z96651 Presence of right artificial knee joint: Secondary | ICD-10-CM | POA: Diagnosis not present

## 2021-01-14 DIAGNOSIS — Z471 Aftercare following joint replacement surgery: Secondary | ICD-10-CM | POA: Diagnosis not present

## 2021-01-16 ENCOUNTER — Ambulatory Visit: Payer: PPO | Admitting: Internal Medicine

## 2021-01-16 DIAGNOSIS — M25561 Pain in right knee: Secondary | ICD-10-CM | POA: Diagnosis not present

## 2021-01-20 DIAGNOSIS — M25561 Pain in right knee: Secondary | ICD-10-CM | POA: Diagnosis not present

## 2021-01-27 ENCOUNTER — Ambulatory Visit (INDEPENDENT_AMBULATORY_CARE_PROVIDER_SITE_OTHER): Payer: HMO | Admitting: General Practice

## 2021-01-27 ENCOUNTER — Other Ambulatory Visit: Payer: Self-pay

## 2021-01-27 DIAGNOSIS — Z7901 Long term (current) use of anticoagulants: Secondary | ICD-10-CM | POA: Diagnosis not present

## 2021-01-27 DIAGNOSIS — M25561 Pain in right knee: Secondary | ICD-10-CM | POA: Diagnosis not present

## 2021-01-27 LAB — POCT INR: INR: 2.7 (ref 2.0–3.0)

## 2021-01-27 NOTE — Progress Notes (Signed)
Medical screening examination/treatment/procedure(s) were performed by non-physician practitioner and as supervising physician I was immediately available for consultation/collaboration. I agree with above. Khushi Zupko, MD   

## 2021-01-27 NOTE — Patient Instructions (Addendum)
Pre visit review using our clinic review tool, if applicable. No additional management support is needed unless otherwise documented below in the visit note.  Continue to take 1 tablet daily except 1 1/2 tablets every Tuesday.  Re-check in 6 weeks.

## 2021-01-30 DIAGNOSIS — M25561 Pain in right knee: Secondary | ICD-10-CM | POA: Diagnosis not present

## 2021-02-04 DIAGNOSIS — M25561 Pain in right knee: Secondary | ICD-10-CM | POA: Diagnosis not present

## 2021-02-06 DIAGNOSIS — M25561 Pain in right knee: Secondary | ICD-10-CM | POA: Diagnosis not present

## 2021-02-09 ENCOUNTER — Telehealth: Payer: Self-pay | Admitting: Cardiology

## 2021-02-09 NOTE — Progress Notes (Signed)
Cardiology Office Note:    Date:  02/10/2021   ID:  [name], DOB 08-Oct-1956, MRN [id number]  PCP:  Biagio Borg, MD   [place]  Cardiologist:  Ena Dawley, MD  Advanced Practice Provider:  No care team member to display Electrophysiologist:  None   Referring MD: Biagio Borg, MD    History of Present Illness:    [name] is a 65 y.o. male with a hx of HTN, DM, CAD with Coronary CTA 07/2015 demonstrating a calcium score 1186, diffuse moderate CAD with a long segment of calcified plaque in the proximal to mid LAD associated with 44 to 69% stenosis, obesity, HLD, recurrent DVT's and PE on chronic anticoagulation who was previously followed by Dr. Meda Coffee who now presents to clinic as an urgent visit for chest pain.  Last saw [name] Barrios PA-C on 04/09/20 where he was doing well. No medication changes.  Patient called clinic on 02/09/21 with intermittent chest pain with associated nausea that had been ongoing since the day prior. He took his SL-NTG with improvement. He now presents to clinic for further evaluation.  Today, the patient states that he developed sharp, pulsing pains in his chest when drinking a beer a couple of nights ago with associated diaphoresis. He took a SL-NTG. He then began to feel very nauseas. Thought it may have been related to reflux but the pain was different than his typical GERD and he never had severe nausea and diaphoresis with reflux before.Symptoms started to ease after about 85minutes after taking the SL-NTG before abating. Had a dull sensation in the center of his chest that has occurred intermittently since that time. Prior to this episode, he was up and walking around without chest discomfort. No recent exertional chest pain, SOB, fatigue, but admits that he has not been overly active. No lightheadedness, dizziness or syncope.   Past Medical History:  Diagnosis Date  . Arthritis   . BACK PAIN   . CAD in native  artery    a. moderate by cardiac CT 2016.  Marland Kitchen DEGENERATIVE JOINT DISEASE, KNEES, BILATERAL   . Depression   . Diabetes mellitus    type II  . DIVERTICULOSIS, COLON   . DVT, lower extremity ([place])    right  . History of kidney stones   . Hyperlipidemia   . Hypertension   . HYPOGONADISM, MALE   . HYPOTENSION, ORTHOSTATIC   . Kidney stones   . Long term current use of anticoagulant   . Obesity   . Pulmonary emboli ([place])   . Renal insufficiency    a. prior h/o, does not appear chronic.    Past Surgical History:  Procedure Laterality Date  . ankle surgury  2001 bilateral   with bone spurs and torn tendon  . COLONOSCOPY  03/18/2010  . EDG  2008   chronic Duodenitis  . GASTRIC BYPASS    . HEEL SPUR SURGERY Bilateral 2005  . knee surgury  2000 & 2003    cartilage damage  . LUMBAR LAMINECTOMY/DECOMPRESSION MICRODISCECTOMY  09/28/2012   Procedure: LUMBAR LAMINECTOMY/DECOMPRESSION MICRODISCECTOMY 2 LEVELS;  Surgeon: [name], MD;  Location: [facility];  Service: Orthopedics;  Laterality: Left;  Decompressive laminectomy L4-L5. Microdiscectomy L5-S1  . Skin removal surgery     extra abdominal skin removed from his weight loss  . TONSILLECTOMY AND ADENOIDECTOMY  age 72 or 15  . TOTAL KNEE ARTHROPLASTY  01/10/2012   Procedure: TOTAL KNEE ARTHROPLASTY;  Surgeon: [name], MD;  Location: [facility];  Service: Orthopedics;  Laterality: Left;  . TOTAL KNEE ARTHROPLASTY Right 11/18/2020   Procedure: TOTAL KNEE ARTHROPLASTY;  Surgeon: Paralee Cancel, MD;  Location: [facility];  Service: Orthopedics;  Laterality: Right;  70 mins  . UPPER GASTROINTESTINAL ENDOSCOPY  03/18/2010    Current Medications: Current Meds  Medication Sig  . Artificial Tear Ointment (ARTIFICIAL TEARS) ointment Place 1 drop into both eyes every 4 (four) hours as needed (dry eyes).   Marland Kitchen aspirin EC 81 MG tablet Take 1 tablet (81 mg total) by mouth daily. Swallow whole.  Marland Kitchen atorvastatin (LIPITOR) 80 MG tablet Take 1 tablet  (80 mg total) by mouth daily.  . Calcium Carbonate (CALCI-CHEW PO) Take 1 tablet by mouth daily.   . chlorthalidone (HYGROTON) 25 MG tablet TAKE 1 TABLET BY MOUTH EVERY DAY  . Cholecalciferol (VITAMIN D3) 125 MCG (5000 UT) CAPS Take 5,000 Units by mouth daily.   . Cyanocobalamin (VITAMIN B-12 SL) Place 1 tablet under the tongue daily.  Marland Kitchen docusate sodium (COLACE) 100 MG capsule Take 1 capsule (100 mg total) by mouth 2 (two) times daily.  Marland Kitchen escitalopram (LEXAPRO) 10 MG tablet TAKE 1 TABLET BY MOUTH EVERY DAY  . Ferrous Sulfate (IRON PO) Take 1 tablet by mouth daily with breakfast.  . fluticasone (FLONASE) 50 MCG/ACT nasal spray Place 2 sprays into both nostrils daily as needed for allergies.  Marland Kitchen glipiZIDE (GLUCOTROL XL) 2.5 MG 24 hr tablet Take 1 tablet (2.5 mg total) by mouth daily with breakfast.  . HYDROcodone-acetaminophen (NORCO) 7.5-325 MG tablet Take 1-2 tablets by mouth every 4 (four) hours as needed for moderate pain.  . isosorbide mononitrate (IMDUR) 30 MG 24 hr tablet Take 1 tablet (30 mg total) by mouth daily.  . metFORMIN (GLUCOPHAGE) 500 MG tablet Take 4 tablets (2,000 mg total) by mouth daily with breakfast.  . methocarbamol (ROBAXIN) 500 MG tablet Take 1 tablet (500 mg total) by mouth every 6 (six) hours as needed for muscle spasms.  . Multiple Vitamin (MULTIVITAMIN WITH MINERALS) TABS tablet Take 1 tablet by mouth daily.  . nitroGLYCERIN (NITROSTAT) 0.4 MG SL tablet Place 1 tablet (0.4 mg total) under the tongue every 5 (five) minutes as needed for chest pain.  . pantoprazole (PROTONIX) 40 MG tablet Take 1 tablet (40 mg total) by mouth daily.  . polyethylene glycol (MIRALAX / GLYCOLAX) 17 g packet Take 17 g by mouth 2 (two) times daily.  . SENNA CO Take 1 tablet by mouth daily. As needed for constipation.  . tamsulosin (FLOMAX) 0.4 MG CAPS capsule Take 1 capsule (0.4 mg total) by mouth daily after supper.  . warfarin (COUMADIN) 10 MG tablet Take 1 tablet daily except take 1 1/2  tablets on Wed or Take as directed by anticoagulation clinic     Allergies:   Adhesive [tape] and Other   Social History   Socioeconomic History  . Marital status: Married    Spouse name: Not on file  . Number of children: 2  . Years of education: Not on file  . Highest education level: High school graduate  Occupational History  . Occupation: Retired    Fish farm manager: [employer]  Tobacco Use  . Smoking status: Former Smoker    Packs/day: 0.25    Years: 5.00    Pack years: 1.25    Quit date: 12/06/1978    Years since quitting: 42.2  . Smokeless tobacco: Never Used  Vaping Use  . Vaping Use:  Never used  Substance and Sexual Activity  . Alcohol use: Yes    Alcohol/week: 2.0 standard drinks    Types: 2 Standard drinks or equivalent per week  . Drug use: No  . Sexual activity: Not Currently  Other Topics Concern  . Not on file  Social History Narrative   Daily caffeine use one per day   Protein drinks    Children; a friend and sister that helps   Brother that is around [place])   Son and dtr in [place]   Lives w/ wife in [place]   Left handed     Retired from [employer]   Former smoker 2 alcoholic beverages daily no caffeine no drug use no other tobacco at this time   Social Determinants of Radio broadcast assistant Strain: Low Risk   . Difficulty of Paying Living Expenses: Not hard at all  Food Insecurity: No Food Insecurity  . Worried About Charity fundraiser in the Last Year: Never true  . Ran Out of Food in the Last Year: Never true  Transportation Needs: No Transportation Needs  . Lack of Transportation (Medical): No  . Lack of Transportation (Non-Medical): No  Physical Activity: Sufficiently Active  . Days of Exercise per Week: 5 days  . Minutes of Exercise per Session: 30 min  Stress: No Stress Concern Present  . Feeling of Stress : Not at all  Social Connections: Socially Integrated  . Frequency of Communication with Friends and Family: More than three  times a week  . Frequency of Social Gatherings with Friends and Family: More than three times a week  . Attends Religious Services: More than 4 times per year  . Active Member of Clubs or Organizations: Yes  . Attends Archivist Meetings: More than 4 times per year  . Marital Status: Married     Family History: The patient's family history includes Diabetes in his brother, sister, sister, and sister; Heart failure in his sister; Hypertension in his mother; Prostate cancer in his father. There is no history of Colon cancer, Colon polyps, Esophageal cancer, Rectal cancer, or Stomach cancer.  ROS:   Please see the history of present illness.    Review of Systems  Constitutional: Positive for diaphoresis and malaise/fatigue. Negative for chills and fever.  HENT: Negative for hearing loss and sore throat.   Eyes: Negative for blurred vision and redness.  Respiratory: Negative for shortness of breath.   Cardiovascular: Positive for chest pain. Negative for palpitations, orthopnea, claudication, leg swelling and PND.  Gastrointestinal: Positive for nausea. Negative for melena and vomiting.  Genitourinary: Negative for dysuria and flank pain.  Musculoskeletal: Positive for joint pain. Negative for falls.  Neurological: Negative for dizziness and loss of consciousness.  Endo/Heme/Allergies: Negative for polydipsia.  Psychiatric/Behavioral: Negative for substance abuse.    EKGs/Labs/Other Studies Reviewed:    The following studies were reviewed today: Coronary CTA 07/30/2015 IMPRESSION: 1. Coronary calcium score of 1186. This was 17 percentile for age and sex matched control.  2. Normal coronary origin.  3. There is diffuse moderate CAD, with a long segment of calcified plaque in the proximal to mid LAD associated with 50-69% stenosis. No obstructive plaque was seen.  An aggressive medical therapy is recommended.  TTE 11/28/2018: Study Conclusions  - Left ventricle: The  cavity size was normal. There was mild  concentric hypertrophy. Systolic function was normal. The  estimated ejection fraction was in the range of 60% to 65%. Wall  motion  was normal; there were no regional wall motion  abnormalities. Doppler parameters are consistent with abnormal  left ventricular relaxation (grade 1 diastolic dysfunction).  Doppler parameters are consistent with indeterminate ventricular  filling pressure.  - Aortic valve: Transvalvular velocity was within the normal range.  There was no stenosis. There was no regurgitation. Valve area  (VTI): 2.67 cm^2. Valve area (Vmean): 3.07 cm^2.  - Aorta: Ascending aortic diameter: 41 mm (S).  - Ascending aorta: The ascending aorta was mildly dilated.  - Mitral valve: Transvalvular velocity was within the normal range.  There was no evidence for stenosis. There was no regurgitation.  - Right ventricle: The cavity size was normal. Wall thickness was  normal. Systolic function was normal.  - Tricuspid valve: There was no regurgitation.  EKG:  EKG is ordered today.  The ekg ordered today demonstrates NSR, RBBB HR 67  Recent Labs: 12/12/2020: ALT 19; BUN 26; Creatinine, Ser 1.43; Hemoglobin 10.8; Platelets 418.0; Potassium 4.0; Sodium 135  Recent Lipid Panel    Component Value Date/Time   CHOL 134 12/12/2020 1602   TRIG 115.0 12/12/2020 1602   HDL 36.70 (L) 12/12/2020 1602   CHOLHDL 4 12/12/2020 1602   VLDL 23.0 12/12/2020 1602   LDLCALC 74 12/12/2020 1602   LDLCALC 74 07/09/2020 1033   LDLDIRECT 116.0 01/15/2020 1624     Risk Assessment/Calculations:       Physical Exam:    VS:  BP 126/86   Pulse 67   Ht 6\' 2"  (1.88 m)   Wt 262 lb 3.2 oz (118.9 kg)   SpO2 96%   BMI 33.66 kg/m     Wt Readings from Last 3 Encounters:  02/10/21 262 lb 3.2 oz (118.9 kg)  12/12/20 256 lb (116.1 kg)  11/18/20 269 lb 9 oz (122.3 kg)     GEN:  Well nourished, well developed in no acute distress HEENT:  Normal NECK: No JVD; No carotid bruits CARDIAC: RRR, no murmurs, rubs, gallops RESPIRATORY:  Clear to auscultation without rales, wheezing or rhonchi  ABDOMEN: Soft, non-tender, non-distended MUSCULOSKELETAL:  No edema; No deformity  SKIN: Warm and dry NEUROLOGIC:  Alert and oriented x 3 PSYCHIATRIC:  Normal affect   ASSESSMENT:    1. Coronary artery disease involving native coronary artery of native heart with unstable angina pectoris ([place])   2. Pre-procedure lab exam   3. Essential hypertension   4. Hyperlipidemia, unspecified hyperlipidemia type   5. RBBB   6. Type 2 diabetes mellitus without complication, without long-term current use of insulin (HCC)   7. Chest pain, unspecified type    PLAN:    In order of problems listed above:  #Chest Pain: #Known CAD: Coronary CTA in 2016 with calcium score 1186 which is 48 percentile for age and sex match control with 46 to 69% proximal to mid LAD. Now presenting with episode of chest pain that developed after drinking a beer. Pain described as a "sharp throbbing" in his chest that came on at rest. Associated with diaphoresis and significant nausea. While not typical anginal symptoms, it is concerning that the patient had the constellation of chest pain, diaphoresis and nausea that was different from his typical reflux. Also had recurrent intermittent chest discomfort the next day. Given history of known moderate CAD on coronary CTA and symptoms, will proceed with coronary angiography -Plan for coronary angiography -Hold warfarin for 5 days prior to cath -Check TTE -Start ASA 81mg  daily -Continue lipitor 80mg  daily -Start imdur 30mg  daily -Continue SL  NTG -Discussed that if symptoms recur and do not respond to SL-NTG, he needs to call EMS immediately to go to the hospital  #Recurrent DVTs/PE: -Hold warfarin 5 days prior to cath; start aspirin as above -Resume post-cath  #DMII: Managed by PCP. -Continue metformin and  glipizide  #HTN: Controlled.  -Will try to add BB pending cath  #HLD: -Continue lipitor 40mg  daily   Shared Decision Making/Informed Consent The risks [stroke (1 in 1000), death (1 in 1000), kidney failure [usually temporary] (1 in 500), bleeding (1 in 200), allergic reaction [possibly serious] (1 in 200)], benefits (diagnostic support and management of coronary artery disease) and alternatives of a cardiac catheterization were discussed in detail with [name] and he is willing to proceed.   Medication Adjustments/Labs and Tests Ordered: Current medicines are reviewed at length with the patient today.  Concerns regarding medicines are outlined above.  Orders Placed This Encounter  Procedures  . CBC  . Basic metabolic panel  . EKG 12-Lead  . ECHOCARDIOGRAM COMPLETE   Meds ordered this encounter  Medications  . isosorbide mononitrate (IMDUR) 30 MG 24 hr tablet    Sig: Take 1 tablet (30 mg total) by mouth daily.    Dispense:  90 tablet    Refill:  3  . aspirin EC 81 MG tablet    Sig: Take 1 tablet (81 mg total) by mouth daily. Swallow whole.    Dispense:  90 tablet    Refill:  3    Patient Instructions  Medication Instructions:  Start Imdur 30 mg daily Your physician recommends that you continue on your current medications as directed. Please refer to the Current Medication list given to you today. *If you need a refill on your cardiac medications before your next appointment, please call your pharmacy*   Lab Work: CBC, BMET If you have labs (blood work) drawn today and your tests are completely normal, you will receive your results only by: Marland Kitchen MyChart Message (if you have MyChart) OR . A paper copy in the mail If you have any lab test that is abnormal or we need to change your treatment, we will call you to review the results.   Testing/Procedures:    [facility] [place], [address] [place] [address] Dept: [place]: [phone]  [name]  02/10/2021  You are scheduled for a  Cath on 02/16/21 with Dr. Saunders Revel at 7:30 am.  1. Please arrive at the [facility] (Main Entrance A) at [facility]: [place], [place] [address] at 5:30am (This time is two hours before your procedure to ensure your preparation). Free valet parking service is available.   Special note: Every effort is made to have your procedure done on time. Please understand that emergencies sometimes delay scheduled procedures.  2. Diet: Do not eat solid foods after midnight.  The patient may have clear liquids until 5am upon the day of the procedure.  3. Labs: You will need to have blood drawn today BMet, CBC; You do not need to be fasting.  4. Medication instructions in preparation for your procedure:   *For reference purposes while preparing patient instructions.   Delete this med list prior to printing instructions for patient.*  Stop taking Coumadin (Warfarin) on Wednesday, March 9.    Do not take Diabetes Med Glucophage (Metformin) on the day of the procedure and HOLD 48 HOURS AFTER THE PROCEDURE.  On the morning of your procedure, take your Aspirin and any morning medicines NOT listed above.  You may use sips of water.  5. Plan for one night stay--bring personal belongings. 6. Bring a current list of your medications and current insurance cards. 7. You MUST have a responsible person to drive you home. 8. Someone MUST be with you the first 24 hours after you arrive home or your discharge will be delayed. 9. Please wear clothes that are easy to get on and off and wear slip-on shoes.  Thank you for allowing [place] to care for you!   -- [facility] Your physician has requested that you have an echocardiogram. Echocardiography is a painless test that uses sound waves to create images of your heart. It provides your  doctor with information about the size and shape of your heart and how well your heart's chambers and valves are working. This procedure takes approximately one hour. There are no restrictions for this procedure.    Follow-Up: At [facility], you and your health needs are our priority.  As part of our continuing mission to provide you with exceptional heart care, we have created designated Provider Care Teams.  These Care Teams include your primary Cardiologist (physician) and Advanced Practice Providers (APPs -  Physician Assistants and Nurse Practitioners) who all work together to provide you with the care you need, when you need it.     Your next appointment:   Will have appt  Dr. Gwyndolyn Kaufman after Cath procedure  Other Instructions  Covid screen 02/14/2021  10:30; before Noon [place]    Signed, Freada Bergeron, MD  02/10/2021 12:44 PM    [facility]

## 2021-02-09 NOTE — H&P (View-Only) (Signed)
Cardiology Office Note:    Date:  02/10/2021   ID:  [name], DOB 08-18-1956, MRN [id number]  PCP:  Biagio Borg, MD   [place]  Cardiologist:  Ena Dawley, MD  Advanced Practice Provider:  No care team member to display Electrophysiologist:  None   Referring MD: Biagio Borg, MD    History of Present Illness:    [name] is a 65 y.o. male with a hx of HTN, DM, CAD with Coronary CTA 07/2015 demonstrating a calcium score 1186, diffuse moderate CAD with a long segment of calcified plaque in the proximal to mid LAD associated with 59 to 69% stenosis, obesity, HLD, recurrent DVT's and PE on chronic anticoagulation who was previously followed by Dr. Meda Coffee who now presents to clinic as an urgent visit for chest pain.  Last saw [name] Barrios PA-C on 04/09/20 where he was doing well. No medication changes.  Patient called clinic on 02/09/21 with intermittent chest pain with associated nausea that had been ongoing since the day prior. He took his SL-NTG with improvement. He now presents to clinic for further evaluation.  Today, the patient states that he developed sharp, pulsing pains in his chest when drinking a beer a couple of nights ago with associated diaphoresis. He took a SL-NTG. He then began to feel very nauseas. Thought it may have been related to reflux but the pain was different than his typical GERD and he never had severe nausea and diaphoresis with reflux before.Symptoms started to ease after about 51minutes after taking the SL-NTG before abating. Had a dull sensation in the center of his chest that has occurred intermittently since that time. Prior to this episode, he was up and walking around without chest discomfort. No recent exertional chest pain, SOB, fatigue, but admits that he has not been overly active. No lightheadedness, dizziness or syncope.   Past Medical History:  Diagnosis Date  . Arthritis   . BACK PAIN   . CAD in native  artery    a. moderate by cardiac CT 2016.  Marland Kitchen DEGENERATIVE JOINT DISEASE, KNEES, BILATERAL   . Depression   . Diabetes mellitus    type II  . DIVERTICULOSIS, COLON   . DVT, lower extremity ([place])    right  . History of kidney stones   . Hyperlipidemia   . Hypertension   . HYPOGONADISM, MALE   . HYPOTENSION, ORTHOSTATIC   . Kidney stones   . Long term current use of anticoagulant   . Obesity   . Pulmonary emboli ([place])   . Renal insufficiency    a. prior h/o, does not appear chronic.    Past Surgical History:  Procedure Laterality Date  . ankle surgury  2001 bilateral   with bone spurs and torn tendon  . COLONOSCOPY  03/18/2010  . EDG  2008   chronic Duodenitis  . GASTRIC BYPASS    . HEEL SPUR SURGERY Bilateral 2005  . knee surgury  2000 & 2003    cartilage damage  . LUMBAR LAMINECTOMY/DECOMPRESSION MICRODISCECTOMY  09/28/2012   Procedure: LUMBAR LAMINECTOMY/DECOMPRESSION MICRODISCECTOMY 2 LEVELS;  Surgeon: [name], MD;  Location: [facility];  Service: Orthopedics;  Laterality: Left;  Decompressive laminectomy L4-L5. Microdiscectomy L5-S1  . Skin removal surgery     extra abdominal skin removed from his weight loss  . TONSILLECTOMY AND ADENOIDECTOMY  age 57 or 8  . TOTAL KNEE ARTHROPLASTY  01/10/2012   Procedure: TOTAL KNEE ARTHROPLASTY;  Surgeon: [name], MD;  Location: [facility];  Service: Orthopedics;  Laterality: Left;  . TOTAL KNEE ARTHROPLASTY Right 11/18/2020   Procedure: TOTAL KNEE ARTHROPLASTY;  Surgeon: Paralee Cancel, MD;  Location: [facility];  Service: Orthopedics;  Laterality: Right;  70 mins  . UPPER GASTROINTESTINAL ENDOSCOPY  03/18/2010    Current Medications: Current Meds  Medication Sig  . Artificial Tear Ointment (ARTIFICIAL TEARS) ointment Place 1 drop into both eyes every 4 (four) hours as needed (dry eyes).   Marland Kitchen aspirin EC 81 MG tablet Take 1 tablet (81 mg total) by mouth daily. Swallow whole.  Marland Kitchen atorvastatin (LIPITOR) 80 MG tablet Take 1 tablet  (80 mg total) by mouth daily.  . Calcium Carbonate (CALCI-CHEW PO) Take 1 tablet by mouth daily.   . chlorthalidone (HYGROTON) 25 MG tablet TAKE 1 TABLET BY MOUTH EVERY DAY  . Cholecalciferol (VITAMIN D3) 125 MCG (5000 UT) CAPS Take 5,000 Units by mouth daily.   . Cyanocobalamin (VITAMIN B-12 SL) Place 1 tablet under the tongue daily.  Marland Kitchen docusate sodium (COLACE) 100 MG capsule Take 1 capsule (100 mg total) by mouth 2 (two) times daily.  Marland Kitchen escitalopram (LEXAPRO) 10 MG tablet TAKE 1 TABLET BY MOUTH EVERY DAY  . Ferrous Sulfate (IRON PO) Take 1 tablet by mouth daily with breakfast.  . fluticasone (FLONASE) 50 MCG/ACT nasal spray Place 2 sprays into both nostrils daily as needed for allergies.  Marland Kitchen glipiZIDE (GLUCOTROL XL) 2.5 MG 24 hr tablet Take 1 tablet (2.5 mg total) by mouth daily with breakfast.  . HYDROcodone-acetaminophen (NORCO) 7.5-325 MG tablet Take 1-2 tablets by mouth every 4 (four) hours as needed for moderate pain.  . isosorbide mononitrate (IMDUR) 30 MG 24 hr tablet Take 1 tablet (30 mg total) by mouth daily.  . metFORMIN (GLUCOPHAGE) 500 MG tablet Take 4 tablets (2,000 mg total) by mouth daily with breakfast.  . methocarbamol (ROBAXIN) 500 MG tablet Take 1 tablet (500 mg total) by mouth every 6 (six) hours as needed for muscle spasms.  . Multiple Vitamin (MULTIVITAMIN WITH MINERALS) TABS tablet Take 1 tablet by mouth daily.  . nitroGLYCERIN (NITROSTAT) 0.4 MG SL tablet Place 1 tablet (0.4 mg total) under the tongue every 5 (five) minutes as needed for chest pain.  . pantoprazole (PROTONIX) 40 MG tablet Take 1 tablet (40 mg total) by mouth daily.  . polyethylene glycol (MIRALAX / GLYCOLAX) 17 g packet Take 17 g by mouth 2 (two) times daily.  . SENNA CO Take 1 tablet by mouth daily. As needed for constipation.  . tamsulosin (FLOMAX) 0.4 MG CAPS capsule Take 1 capsule (0.4 mg total) by mouth daily after supper.  . warfarin (COUMADIN) 10 MG tablet Take 1 tablet daily except take 1 1/2  tablets on Wed or Take as directed by anticoagulation clinic     Allergies:   Adhesive [tape] and Other   Social History   Socioeconomic History  . Marital status: Married    Spouse name: Not on file  . Number of children: 2  . Years of education: Not on file  . Highest education level: High school graduate  Occupational History  . Occupation: Retired    Fish farm manager: [employer]  Tobacco Use  . Smoking status: Former Smoker    Packs/day: 0.25    Years: 5.00    Pack years: 1.25    Quit date: 12/06/1978    Years since quitting: 42.2  . Smokeless tobacco: Never Used  Vaping Use  . Vaping Use:  Never used  Substance and Sexual Activity  . Alcohol use: Yes    Alcohol/week: 2.0 standard drinks    Types: 2 Standard drinks or equivalent per week  . Drug use: No  . Sexual activity: Not Currently  Other Topics Concern  . Not on file  Social History Narrative   Daily caffeine use one per day   Protein drinks    Children; a friend and sister that helps   Brother that is around [place])   Son and dtr in [place]   Lives w/ wife in [place]   Left handed     Retired from [employer]   Former smoker 2 alcoholic beverages daily no caffeine no drug use no other tobacco at this time   Social Determinants of Radio broadcast assistant Strain: Low Risk   . Difficulty of Paying Living Expenses: Not hard at all  Food Insecurity: No Food Insecurity  . Worried About Charity fundraiser in the Last Year: Never true  . Ran Out of Food in the Last Year: Never true  Transportation Needs: No Transportation Needs  . Lack of Transportation (Medical): No  . Lack of Transportation (Non-Medical): No  Physical Activity: Sufficiently Active  . Days of Exercise per Week: 5 days  . Minutes of Exercise per Session: 30 min  Stress: No Stress Concern Present  . Feeling of Stress : Not at all  Social Connections: Socially Integrated  . Frequency of Communication with Friends and Family: More than three  times a week  . Frequency of Social Gatherings with Friends and Family: More than three times a week  . Attends Religious Services: More than 4 times per year  . Active Member of Clubs or Organizations: Yes  . Attends Archivist Meetings: More than 4 times per year  . Marital Status: Married     Family History: The patient's family history includes Diabetes in his brother, sister, sister, and sister; Heart failure in his sister; Hypertension in his mother; Prostate cancer in his father. There is no history of Colon cancer, Colon polyps, Esophageal cancer, Rectal cancer, or Stomach cancer.  ROS:   Please see the history of present illness.    Review of Systems  Constitutional: Positive for diaphoresis and malaise/fatigue. Negative for chills and fever.  HENT: Negative for hearing loss and sore throat.   Eyes: Negative for blurred vision and redness.  Respiratory: Negative for shortness of breath.   Cardiovascular: Positive for chest pain. Negative for palpitations, orthopnea, claudication, leg swelling and PND.  Gastrointestinal: Positive for nausea. Negative for melena and vomiting.  Genitourinary: Negative for dysuria and flank pain.  Musculoskeletal: Positive for joint pain. Negative for falls.  Neurological: Negative for dizziness and loss of consciousness.  Endo/Heme/Allergies: Negative for polydipsia.  Psychiatric/Behavioral: Negative for substance abuse.    EKGs/Labs/Other Studies Reviewed:    The following studies were reviewed today: Coronary CTA 07/30/2015 IMPRESSION: 1. Coronary calcium score of 1186. This was 56 percentile for age and sex matched control.  2. Normal coronary origin.  3. There is diffuse moderate CAD, with a long segment of calcified plaque in the proximal to mid LAD associated with 50-69% stenosis. No obstructive plaque was seen.  An aggressive medical therapy is recommended.  TTE 11/28/2018: Study Conclusions  - Left ventricle: The  cavity size was normal. There was mild  concentric hypertrophy. Systolic function was normal. The  estimated ejection fraction was in the range of 60% to 65%. Wall  motion  was normal; there were no regional wall motion  abnormalities. Doppler parameters are consistent with abnormal  left ventricular relaxation (grade 1 diastolic dysfunction).  Doppler parameters are consistent with indeterminate ventricular  filling pressure.  - Aortic valve: Transvalvular velocity was within the normal range.  There was no stenosis. There was no regurgitation. Valve area  (VTI): 2.67 cm^2. Valve area (Vmean): 3.07 cm^2.  - Aorta: Ascending aortic diameter: 41 mm (S).  - Ascending aorta: The ascending aorta was mildly dilated.  - Mitral valve: Transvalvular velocity was within the normal range.  There was no evidence for stenosis. There was no regurgitation.  - Right ventricle: The cavity size was normal. Wall thickness was  normal. Systolic function was normal.  - Tricuspid valve: There was no regurgitation.  EKG:  EKG is ordered today.  The ekg ordered today demonstrates NSR, RBBB HR 67  Recent Labs: 12/12/2020: ALT 19; BUN 26; Creatinine, Ser 1.43; Hemoglobin 10.8; Platelets 418.0; Potassium 4.0; Sodium 135  Recent Lipid Panel    Component Value Date/Time   CHOL 134 12/12/2020 1602   TRIG 115.0 12/12/2020 1602   HDL 36.70 (L) 12/12/2020 1602   CHOLHDL 4 12/12/2020 1602   VLDL 23.0 12/12/2020 1602   LDLCALC 74 12/12/2020 1602   LDLCALC 74 07/09/2020 1033   LDLDIRECT 116.0 01/15/2020 1624     Risk Assessment/Calculations:       Physical Exam:    VS:  BP 126/86   Pulse 67   Ht 6\' 2"  (1.88 m)   Wt 262 lb 3.2 oz (118.9 kg)   SpO2 96%   BMI 33.66 kg/m     Wt Readings from Last 3 Encounters:  02/10/21 262 lb 3.2 oz (118.9 kg)  12/12/20 256 lb (116.1 kg)  11/18/20 269 lb 9 oz (122.3 kg)     GEN:  Well nourished, well developed in no acute distress HEENT:  Normal NECK: No JVD; No carotid bruits CARDIAC: RRR, no murmurs, rubs, gallops RESPIRATORY:  Clear to auscultation without rales, wheezing or rhonchi  ABDOMEN: Soft, non-tender, non-distended MUSCULOSKELETAL:  No edema; No deformity  SKIN: Warm and dry NEUROLOGIC:  Alert and oriented x 3 PSYCHIATRIC:  Normal affect   ASSESSMENT:    1. Coronary artery disease involving native coronary artery of native heart with unstable angina pectoris ([place])   2. Pre-procedure lab exam   3. Essential hypertension   4. Hyperlipidemia, unspecified hyperlipidemia type   5. RBBB   6. Type 2 diabetes mellitus without complication, without long-term current use of insulin (HCC)   7. Chest pain, unspecified type    PLAN:    In order of problems listed above:  #Chest Pain: #Known CAD: Coronary CTA in 2016 with calcium score 1186 which is 32 percentile for age and sex match control with 6 to 69% proximal to mid LAD. Now presenting with episode of chest pain that developed after drinking a beer. Pain described as a "sharp throbbing" in his chest that came on at rest. Associated with diaphoresis and significant nausea. While not typical anginal symptoms, it is concerning that the patient had the constellation of chest pain, diaphoresis and nausea that was different from his typical reflux. Also had recurrent intermittent chest discomfort the next day. Given history of known moderate CAD on coronary CTA and symptoms, will proceed with coronary angiography -Plan for coronary angiography -Hold warfarin for 5 days prior to cath -Check TTE -Start ASA 81mg  daily -Continue lipitor 80mg  daily -Start imdur 30mg  daily -Continue SL  NTG -Discussed that if symptoms recur and do not respond to SL-NTG, he needs to call EMS immediately to go to the hospital  #Recurrent DVTs/PE: -Hold warfarin 5 days prior to cath; start aspirin as above -Resume post-cath  #DMII: Managed by PCP. -Continue metformin and  glipizide  #HTN: Controlled.  -Will try to add BB pending cath  #HLD: -Continue lipitor 40mg  daily   Shared Decision Making/Informed Consent The risks [stroke (1 in 1000), death (1 in 1000), kidney failure [usually temporary] (1 in 500), bleeding (1 in 200), allergic reaction [possibly serious] (1 in 200)], benefits (diagnostic support and management of coronary artery disease) and alternatives of a cardiac catheterization were discussed in detail with [name] and he is willing to proceed.   Medication Adjustments/Labs and Tests Ordered: Current medicines are reviewed at length with the patient today.  Concerns regarding medicines are outlined above.  Orders Placed This Encounter  Procedures  . CBC  . Basic metabolic panel  . EKG 12-Lead  . ECHOCARDIOGRAM COMPLETE   Meds ordered this encounter  Medications  . isosorbide mononitrate (IMDUR) 30 MG 24 hr tablet    Sig: Take 1 tablet (30 mg total) by mouth daily.    Dispense:  90 tablet    Refill:  3  . aspirin EC 81 MG tablet    Sig: Take 1 tablet (81 mg total) by mouth daily. Swallow whole.    Dispense:  90 tablet    Refill:  3    Patient Instructions  Medication Instructions:  Start Imdur 30 mg daily Your physician recommends that you continue on your current medications as directed. Please refer to the Current Medication list given to you today. *If you need a refill on your cardiac medications before your next appointment, please call your pharmacy*   Lab Work: CBC, BMET If you have labs (blood work) drawn today and your tests are completely normal, you will receive your results only by: Marland Kitchen MyChart Message (if you have MyChart) OR . A paper copy in the mail If you have any lab test that is abnormal or we need to change your treatment, we will call you to review the results.   Testing/Procedures:    [place] OFFICE [place], [address] [place] [address] Dept: [place]: [phone]  [name]  02/10/2021  You are scheduled for a  Cath on 02/16/21 with Dr. Saunders Revel at 7:30 am.  1. Please arrive at the [facility] (Main Entrance A) at [facility]: [place], [place] [address] at 5:30am (This time is two hours before your procedure to ensure your preparation). Free valet parking service is available.   Special note: Every effort is made to have your procedure done on time. Please understand that emergencies sometimes delay scheduled procedures.  2. Diet: Do not eat solid foods after midnight.  The patient may have clear liquids until 5am upon the day of the procedure.  3. Labs: You will need to have blood drawn today BMet, CBC; You do not need to be fasting.  4. Medication instructions in preparation for your procedure:   *For reference purposes while preparing patient instructions.   Delete this med list prior to printing instructions for patient.*  Stop taking Coumadin (Warfarin) on Wednesday, March 9.    Do not take Diabetes Med Glucophage (Metformin) on the day of the procedure and HOLD 48 HOURS AFTER THE PROCEDURE.  On the morning of your procedure, take your Aspirin and any morning medicines NOT listed above.  You may use sips of water.  5. Plan for one night stay--bring personal belongings. 6. Bring a current list of your medications and current insurance cards. 7. You MUST have a responsible person to drive you home. 8. Someone MUST be with you the first 24 hours after you arrive home or your discharge will be delayed. 9. Please wear clothes that are easy to get on and off and wear slip-on shoes.  Thank you for allowing [place] to care for you!   -- [place] [facility] Your physician has requested that you have an echocardiogram. Echocardiography is a painless test that uses sound waves to create images of your heart. It provides your  doctor with information about the size and shape of your heart and how well your heart's chambers and valves are working. This procedure takes approximately one hour. There are no restrictions for this procedure.    Follow-Up: At [facility], you and your health needs are our priority.  As part of our continuing mission to provide you with exceptional heart care, we have created designated Provider Care Teams.  These Care Teams include your primary Cardiologist (physician) and Advanced Practice Providers (APPs -  Physician Assistants and Nurse Practitioners) who all work together to provide you with the care you need, when you need it.     Your next appointment:   Will have appt  Dr. Gwyndolyn Kaufman after Cath procedure  Other Instructions  Covid screen 02/14/2021  10:30; before Noon [place]    Signed, Freada Bergeron, MD  02/10/2021 12:44 PM    [place]

## 2021-02-09 NOTE — Telephone Encounter (Signed)
Thank you! Good work getting him in urgently!

## 2021-02-09 NOTE — Telephone Encounter (Signed)
Pt c/o of Chest Pain: STAT if CP now or developed within 24 hours  1. Are you having CP right now? Yes   2. Are you experiencing any other symptoms (ex. SOB, nausea, vomiting, sweating)? Nausea.  3. How long have you been experiencing CP? Since yesterday afternoon  4. Is your CP continuous or coming and going? Coming and going   5. Have you taken Nitroglycerin? Yes  ?

## 2021-02-09 NOTE — Telephone Encounter (Signed)
Pt is calling to make an appt for sometime this week with Dr. Meda Coffee, or newly assigned Cardiologist, Dr. Johney Frame. Pt states he had an episode of chest pain yesterday, and resorted to taking one nitroglycerin, which mostly resolved his chest pain. Pt states yesterday he had chest pain, nausea, and diaphoresis.  He states he did not have sob, doe, palpitations, vomiting, referred pain, pre-syncopal or syncopal episodes.  Pt states he took one SL Nitro and his chest pain went from an 8 to a 2.   Pt states he is asymptomatic from a cardiac perspective today, but is concerned given his chest pain episode, cardiac hx, and known CAD. He had no recent BP/HR readings to provide me at this time.  Pt had a cardiac CT done back in 2016, where he had an elevated calcium score of 1186, diffuse moderate CAD with a long segment of calcified plaque in the proximal to mid LAD 50-69% stenosis.  Pt also has long-standing history of HTN, and seen in our BP clinic.  Pt last seen by Estella Husk PA-C back in 04/2020 and last saw Dr. Meda Coffee back in 07/2015.  Scheduler made the pt an appt to see Dr. Johney Frame for tomorrow 3/8 at 1020.  He is aware to arrive 15 mins prior to this appt.  Advised the pt to continue his current regimen, monitor cardiac symptoms, and utilize SL nitro as needed.  Pt education provided on correct administration of Nitro use.  ED precautions provided to the pt, if symptoms were to persist or worsen between now and his office visit with [place] tomorrow.  Pt education provided on what S/S warrant immediate medical attention.  Informed the pt that I will route this message to both Dr. Meda Coffee and Dr. Johney Frame, to make them aware of this plan. Pt verbalized understanding and agrees with this plan.

## 2021-02-10 ENCOUNTER — Other Ambulatory Visit: Payer: Self-pay

## 2021-02-10 ENCOUNTER — Encounter: Payer: Self-pay | Admitting: Cardiology

## 2021-02-10 ENCOUNTER — Ambulatory Visit (INDEPENDENT_AMBULATORY_CARE_PROVIDER_SITE_OTHER): Payer: HMO | Admitting: Cardiology

## 2021-02-10 VITALS — BP 126/86 | HR 67 | Ht 74.0 in | Wt 262.2 lb

## 2021-02-10 DIAGNOSIS — I4510 Unspecified right bundle-branch block: Secondary | ICD-10-CM

## 2021-02-10 DIAGNOSIS — I25110 Atherosclerotic heart disease of native coronary artery with unstable angina pectoris: Secondary | ICD-10-CM

## 2021-02-10 DIAGNOSIS — E119 Type 2 diabetes mellitus without complications: Secondary | ICD-10-CM

## 2021-02-10 DIAGNOSIS — Z01812 Encounter for preprocedural laboratory examination: Secondary | ICD-10-CM | POA: Diagnosis not present

## 2021-02-10 DIAGNOSIS — R079 Chest pain, unspecified: Secondary | ICD-10-CM | POA: Diagnosis not present

## 2021-02-10 DIAGNOSIS — E785 Hyperlipidemia, unspecified: Secondary | ICD-10-CM | POA: Diagnosis not present

## 2021-02-10 DIAGNOSIS — I10 Essential (primary) hypertension: Secondary | ICD-10-CM

## 2021-02-10 LAB — BASIC METABOLIC PANEL
BUN/Creatinine Ratio: 22 (ref 10–24)
BUN: 28 mg/dL — ABNORMAL HIGH (ref 8–27)
CO2: 23 mmol/L (ref 20–29)
Calcium: 9.7 mg/dL (ref 8.6–10.2)
Chloride: 100 mmol/L (ref 96–106)
Creatinine, Ser: 1.3 mg/dL — ABNORMAL HIGH (ref 0.76–1.27)
Glucose: 107 mg/dL — ABNORMAL HIGH (ref 65–99)
Potassium: 4.7 mmol/L (ref 3.5–5.2)
Sodium: 137 mmol/L (ref 134–144)
eGFR: 60 mL/min/{1.73_m2} (ref >59)

## 2021-02-10 LAB — CBC
Hematocrit: 43.9 % (ref 37.5–51.0)
Hemoglobin: 13 g/dL (ref 13.0–17.7)
MCH: 21.6 pg — ABNORMAL LOW (ref 26.6–33.0)
MCHC: 29.6 g/dL — ABNORMAL LOW (ref 31.5–35.7)
MCV: 73 fL — ABNORMAL LOW (ref 79–97)
Platelets: 285 10*3/uL (ref 150–450)
RBC: 6.03 x10E6/uL — ABNORMAL HIGH (ref 4.14–5.80)
RDW: 15.9 % — ABNORMAL HIGH (ref 11.6–15.4)
WBC: 8.6 10*3/uL (ref 3.4–10.8)

## 2021-02-10 MED ORDER — ISOSORBIDE MONONITRATE ER 30 MG PO TB24: 30.0000 mg | ORAL_TABLET | Freq: Every day | ORAL | 3 refills | Status: DC

## 2021-02-10 MED ORDER — ASPIRIN EC 81 MG PO TBEC: 81.0000 mg | DELAYED_RELEASE_TABLET | Freq: Every day | ORAL | 3 refills | Status: DC

## 2021-02-10 NOTE — Patient Instructions (Addendum)
Medication Instructions:  Start Imdur 30 mg daily Your physician recommends that you continue on your current medications as directed. Please refer to the Current Medication list given to you today. *If you need a refill on your cardiac medications before your next appointment, please call your pharmacy*   Lab Work: CBC, BMET If you have labs (blood work) drawn today and your tests are completely normal, you will receive your results only by: Marland Kitchen MyChart Message (if you have MyChart) OR . A paper copy in the mail If you have any lab test that is abnormal or we need to change your treatment, we will call you to review the results.   Testing/Procedures:    [place] OFFICE [place], [address]  [place] [address] Dept: [place]: [phone]  NOSSON WENDER  02/10/2021  You are scheduled for a  Cath on 02/16/21 with Dr. Saunders Revel at 7:30 am.  1. Please arrive at the [facility] (Main Entrance A) at [facility]: [place], [place] [address] at 5:30am (This time is two hours before your procedure to ensure your preparation). Free valet parking service is available.   Special note: Every effort is made to have your procedure done on time. Please understand that emergencies sometimes delay scheduled procedures.  2. Diet: Do not eat solid foods after midnight.  The patient may have clear liquids until 5am upon the day of the procedure.  3. Labs: You will need to have blood drawn today BMet, CBC; You do not need to be fasting.  4. Medication instructions in preparation for your procedure:   *For reference purposes while preparing patient instructions.   Delete this med list prior to printing instructions for patient.*  Stop taking Coumadin (Warfarin) on Wednesday, March 9.    Do not take Diabetes Med Glucophage (Metformin) on the day of the procedure and HOLD 48 HOURS AFTER  THE PROCEDURE.    On the morning of your procedure, take your Aspirin and any morning medicines NOT listed above.  You may use sips of water.  5. Plan for one night stay--bring personal belongings. 6. Bring a current list of your medications and current insurance cards. 7. You MUST have a responsible person to drive you home. 8. Someone MUST be with you the first 24 hours after you arrive home or your discharge will be delayed. 9. Please wear clothes that are easy to get on and off and wear slip-on shoes.  Thank you for allowing [place] to care for you!   -- [place] [facility] Your physician has requested that you have an echocardiogram. Echocardiography is a painless test that uses sound waves to create images of your heart. It provides your doctor with information about the size and shape of your heart and how well your heart's chambers and valves are working. This procedure takes approximately one hour. There are no restrictions for this procedure.    Follow-Up: At [facility], you and your health needs are our priority.  As part of our continuing mission to provide you with exceptional heart care, we have created designated Provider Care Teams.  These Care Teams include your primary Cardiologist (physician) and Advanced Practice Providers (APPs -  Physician Assistants and Nurse Practitioners) who all work together to provide you with the care you need, when you need it.     Your next appointment:   Will have appt  Dr.  Gwyndolyn Kaufman after Cath procedure  Other Instructions  Covid screen 02/14/2021  10:30; before Noon [place], [place]

## 2021-02-11 ENCOUNTER — Telehealth: Payer: Self-pay | Admitting: General Practice

## 2021-02-11 ENCOUNTER — Telehealth: Payer: Self-pay

## 2021-02-11 ENCOUNTER — Other Ambulatory Visit: Payer: Self-pay | Admitting: General Practice

## 2021-02-11 DIAGNOSIS — Z7901 Long term (current) use of anticoagulants: Secondary | ICD-10-CM

## 2021-02-11 MED ORDER — ENOXAPARIN SODIUM 120 MG/0.8ML ~~LOC~~ SOLN: 1.0000 mg/kg | Freq: Two times a day (BID) | SUBCUTANEOUS | 0 refills | Status: DC

## 2021-02-11 NOTE — Telephone Encounter (Signed)
Anticoagulation Note:  Instructions for Coumadin and Lovenox pre and post procedure on 3/14.  3/9 - Last dose of coumadin until after procedure 3/10 - Nothing (No coumadin and No Lovenox) 3/11 - Lovenox in and AM and PM (12 hours apart) 3/12 - Lovenox in the AM and PM 3/13 - Lovenox in the AM only (Take by 7 am) 3/14- Procedure (DO NOT TAKE LOVENOX TODAY) 3/15 - Lovenox in the AM and PM and 1 1/2 tablets of coumadin 3/16 - Lovenox in the AM and PM and 1 1/2 tablets of coumadin 3/17 - Lovenox in the AM and PM and 1 1/2 tablets of coumadin 3/18 - Lovenox in the AM and PM and 1 1/2 tablets of coumadin 3/19 - Stop Lovenox and continue to take prior dosage of coumadin  3/22 - Check INR

## 2021-02-11 NOTE — Telephone Encounter (Signed)
[name] Bergeron, MD  [facility] Triage Blood counts, electrolytes and kidney function look stable. Will follow-up his cath results. Pt given the above result notes and has no questions/concerns at this time.

## 2021-02-11 NOTE — Telephone Encounter (Signed)
-----   Message from Biagio Borg, MD sent at 02/10/2021  5:56 PM EST ----- Regarding: RE: Lovenox bridge Yes please for lovenox ----- Message ----- From: Warden Fillers, RN Sent: 02/10/2021   3:30 PM EST To: Biagio Borg, MD Subject: Lovenox bridge                                 Dr. Jenny Reichmann,  Patient will be having a cardiac catheterization on 3/14 and will need to stop warfarin for 5 days. Pt scores a 2 on the CHADS scale however he has had multiple PE's with the last in 2012. Do you recommend a Lovenox bridge?  Please advise.  Thanks, Villa Herb, RN

## 2021-02-11 NOTE — Telephone Encounter (Signed)
-----   Message from Binnie Rail, MD sent at 02/11/2021  4:31 PM EST ----- Regarding: RE: Lovenox bridge Good - cardio agreed with bridge too.   ----- Message ----- From: Warden Fillers, RN Sent: 02/11/2021   7:59 AM EST To: Binnie Rail, MD Subject: RE: Lovenox bridge                             I agree.  I also heard back from Dr. Jenny Reichmann and he says yes to Lovenox. ----- Message ----- From: Binnie Rail, MD Sent: 02/10/2021   5:00 PM EST To: Warden Fillers, RN Subject: RE: Lovenox bridge                             He just saw cardio today - she did not mention a bridge in her note - I just sent her a message to see what she thinks.  I would rather be safe........   ----- Message ----- From: Warden Fillers, RN Sent: 02/10/2021   4:55 PM EST To: Binnie Rail, MD Subject: Lovenox bridge                                 Dr. Quay Burow  Patient is scheduled for a cardiac catheterization on 3/14.  He will need to stop coumadin for 5 days. He as been bridged with Lovenox in the past while stopping warfarin. CHADS score is 2.  He has had multiple PE's with the last being in 2012.  Would you recommend a Lovenox bridge?  Please advise.  Thanks, Villa Herb

## 2021-02-12 ENCOUNTER — Telehealth: Payer: Self-pay | Admitting: *Deleted

## 2021-02-12 NOTE — Telephone Encounter (Signed)
Pt contacted pre-catheterization scheduled at [facility] [place] for: Monday February 16, 2021 7:30 AM Verified arrival time and place: [facility]) at: 5:30 AM   No solid food after midnight prior to cath, clear liquids until 5 AM day of procedure.  Hold: Coumadin-none 02/12/21 until post procedure/Lovenox bridge-pt states he has instructions  Glipizide-AM of procedure Metformin-day of procedure and 48 hours post procedure Chlorthalidone-AM of procedure  Except hold medications AM meds can be  taken pre-cath with sips of water including: ASA 81 mg   Confirmed patient has responsible adult to drive home post procedure and be with patient first 24 hours after arriving home: yes  You are allowed ONE visitor in the waiting room during the time you are at the hospital for your procedure. Both you and your visitor must wear a mask once you enter the hospital.   Reviewed procedure/mask/visitor instructions with patient.

## 2021-02-12 NOTE — Telephone Encounter (Signed)
I sent a message to someone with the anticoagulation clinic, but yes, he will need a bridge.

## 2021-02-13 ENCOUNTER — Other Ambulatory Visit: Payer: Self-pay | Admitting: Internal Medicine

## 2021-02-13 DIAGNOSIS — Z7901 Long term (current) use of anticoagulants: Secondary | ICD-10-CM

## 2021-02-14 ENCOUNTER — Other Ambulatory Visit (HOSPITAL_COMMUNITY)
Admission: RE | Admit: 2021-02-14 | Discharge: 2021-02-14 | Disposition: A | Discharge status: Home / Self Care | Payer: HMO | Source: Ambulatory Visit | Attending: Cardiovascular Disease | Admitting: Cardiovascular Disease

## 2021-02-14 DIAGNOSIS — Z20822 Contact with and (suspected) exposure to covid-19: Secondary | ICD-10-CM | POA: Diagnosis not present

## 2021-02-14 DIAGNOSIS — Z01812 Encounter for preprocedural laboratory examination: Secondary | ICD-10-CM | POA: Diagnosis not present

## 2021-02-14 LAB — SARS CORONAVIRUS 2 (TAT 6-24 HRS): SARS Coronavirus 2: NEGATIVE

## 2021-02-16 ENCOUNTER — Other Ambulatory Visit: Payer: Self-pay

## 2021-02-16 ENCOUNTER — Ambulatory Visit (HOSPITAL_COMMUNITY)
Admission: RE | Disposition: A | Discharge status: Home / Self Care | Payer: Self-pay | Source: Home / Self Care | Attending: Cardiovascular Disease

## 2021-02-16 ENCOUNTER — Ambulatory Visit (HOSPITAL_COMMUNITY)
Admission: RE | Admit: 2021-02-16 | Discharge: 2021-02-16 | Disposition: A | Discharge status: Home / Self Care | Payer: HMO | Attending: Cardiovascular Disease | Admitting: Cardiovascular Disease

## 2021-02-16 DIAGNOSIS — Z87891 Personal history of nicotine dependence: Secondary | ICD-10-CM | POA: Diagnosis not present

## 2021-02-16 DIAGNOSIS — I2584 Coronary atherosclerosis due to calcified coronary lesion: Secondary | ICD-10-CM | POA: Diagnosis not present

## 2021-02-16 DIAGNOSIS — I2510 Atherosclerotic heart disease of native coronary artery without angina pectoris: Secondary | ICD-10-CM | POA: Diagnosis not present

## 2021-02-16 DIAGNOSIS — R079 Chest pain, unspecified: Secondary | ICD-10-CM | POA: Insufficient documentation

## 2021-02-16 DIAGNOSIS — Z7901 Long term (current) use of anticoagulants: Secondary | ICD-10-CM | POA: Diagnosis not present

## 2021-02-16 DIAGNOSIS — Z7984 Long term (current) use of oral hypoglycemic drugs: Secondary | ICD-10-CM | POA: Insufficient documentation

## 2021-02-16 DIAGNOSIS — Z7982 Long term (current) use of aspirin: Secondary | ICD-10-CM | POA: Insufficient documentation

## 2021-02-16 HISTORY — PX: LEFT HEART CATH AND CORONARY ANGIOGRAPHY: CATH118249

## 2021-02-16 LAB — GLUCOSE, CAPILLARY
Glucose-Capillary: 142 mg/dL — ABNORMAL HIGH (ref 70–99)
Glucose-Capillary: 121 mg/dL — ABNORMAL HIGH (ref 70–99)

## 2021-02-16 LAB — PROTIME-INR
INR: 1.2 (ref 0.8–1.2)
Prothrombin Time: 14.5 seconds (ref 11.4–15.2)

## 2021-02-16 SURGERY — LEFT HEART CATH AND CORONARY ANGIOGRAPHY
Anesthesia: LOCAL

## 2021-02-16 MED ORDER — FENTANYL CITRATE (PF) 100 MCG/2ML IJ SOLN
INTRAMUSCULAR | Status: DC | PRN
  Administered 2021-02-16: 25 ug via INTRAVENOUS

## 2021-02-16 MED ORDER — ACETAMINOPHEN 325 MG PO TABS: 650.0000 mg | ORAL_TABLET | ORAL | Status: DC | PRN

## 2021-02-16 MED ORDER — MIDAZOLAM HCL 2 MG/2ML IJ SOLN
INTRAMUSCULAR | Status: AC
  Filled 2021-02-16: qty 2

## 2021-02-16 MED ORDER — VERAPAMIL HCL 2.5 MG/ML IV SOLN
INTRAVENOUS | Status: AC
  Filled 2021-02-16: qty 2

## 2021-02-16 MED ORDER — ASPIRIN 81 MG PO CHEW: 81.0000 mg | CHEWABLE_TABLET | ORAL | Status: DC

## 2021-02-16 MED ORDER — ONDANSETRON HCL 4 MG/2ML IJ SOLN: 4.0000 mg | Freq: Four times a day (QID) | INTRAMUSCULAR | Status: DC | PRN

## 2021-02-16 MED ORDER — HEPARIN (PORCINE) IN NACL 1000-0.9 UT/500ML-% IV SOLN
INTRAVENOUS | Status: DC | PRN
  Administered 2021-02-16: 500 mL

## 2021-02-16 MED ORDER — SODIUM CHLORIDE 0.9 % WEIGHT BASED INFUSION: 1.0000 mL/kg/h | INTRAVENOUS | Status: DC

## 2021-02-16 MED ORDER — LIDOCAINE HCL (PF) 1 % IJ SOLN
INTRAMUSCULAR | Status: DC | PRN
  Administered 2021-02-16: 2 mL

## 2021-02-16 MED ORDER — SODIUM CHLORIDE 0.9% FLUSH
3.0000 mL | INTRAVENOUS | Status: DC | PRN
Start: 2021-02-16 — End: 2021-02-16

## 2021-02-16 MED ORDER — VERAPAMIL HCL 2.5 MG/ML IV SOLN
INTRAVENOUS | Status: DC | PRN
  Administered 2021-02-16: 10 mL via INTRA_ARTERIAL

## 2021-02-16 MED ORDER — MIDAZOLAM HCL 2 MG/2ML IJ SOLN
INTRAMUSCULAR | Status: DC | PRN
  Administered 2021-02-16: 2 mg via INTRAVENOUS

## 2021-02-16 MED ORDER — LABETALOL HCL 5 MG/ML IV SOLN: 10.0000 mg | INTRAVENOUS | Status: DC | PRN

## 2021-02-16 MED ORDER — SODIUM CHLORIDE 0.9% FLUSH: 3.0000 mL | INTRAVENOUS | Status: DC | PRN

## 2021-02-16 MED ORDER — SODIUM CHLORIDE 0.9 % IV SOLN: 250.0000 mL | INTRAVENOUS | Status: DC | PRN

## 2021-02-16 MED ORDER — HYDRALAZINE HCL 20 MG/ML IJ SOLN: 10.0000 mg | INTRAMUSCULAR | Status: DC | PRN

## 2021-02-16 MED ORDER — FENTANYL CITRATE (PF) 100 MCG/2ML IJ SOLN
INTRAMUSCULAR | Status: AC
  Filled 2021-02-16: qty 2

## 2021-02-16 MED ORDER — LIDOCAINE HCL (PF) 1 % IJ SOLN
INTRAMUSCULAR | Status: AC
  Filled 2021-02-16: qty 30

## 2021-02-16 MED ORDER — HEPARIN SODIUM (PORCINE) 1000 UNIT/ML IJ SOLN
INTRAMUSCULAR | Status: DC | PRN
  Administered 2021-02-16: 5000 [IU] via INTRAVENOUS

## 2021-02-16 MED ORDER — SODIUM CHLORIDE 0.9 % WEIGHT BASED INFUSION
3.0000 mL/kg/h | INTRAVENOUS | Status: AC
  Administered 2021-02-16: 3 mL/kg/h via INTRAVENOUS

## 2021-02-16 MED ORDER — SODIUM CHLORIDE 0.9% FLUSH: 3.0000 mL | Freq: Two times a day (BID) | INTRAVENOUS | Status: DC

## 2021-02-16 MED ORDER — IOHEXOL 350 MG/ML SOLN
INTRAVENOUS | Status: DC | PRN
  Administered 2021-02-16: 50 mL

## 2021-02-16 MED ORDER — HEPARIN SODIUM (PORCINE) 1000 UNIT/ML IJ SOLN
INTRAMUSCULAR | Status: AC
  Filled 2021-02-16: qty 1

## 2021-02-16 MED ORDER — HEPARIN (PORCINE) IN NACL 1000-0.9 UT/500ML-% IV SOLN
INTRAVENOUS | Status: AC
  Filled 2021-02-16: qty 1500

## 2021-02-16 SURGICAL SUPPLY — 9 items
CATH 5FR JL3.5 JR4 ANG PIG MP (CATHETERS) ×2 IMPLANT
DEVICE RAD COMP TR BAND LRG (VASCULAR PRODUCTS) ×2 IMPLANT
GLIDESHEATH SLEND SS 6F .021 (SHEATH) ×2 IMPLANT
GUIDEWIRE INQWIRE 1.5J.035X260 (WIRE) ×1 IMPLANT
INQWIRE 1.5J .035X260CM (WIRE) ×2
KIT HEART LEFT (KITS) ×2 IMPLANT
PACK CARDIAC CATHETERIZATION (CUSTOM PROCEDURE TRAY) ×2 IMPLANT
TRANSDUCER W/STOPCOCK (MISCELLANEOUS) ×2 IMPLANT
TUBING CIL FLEX 10 FLL-RA (TUBING) ×2 IMPLANT

## 2021-02-16 NOTE — Discharge Instructions (Signed)
Radial Site Care  This sheet gives you information about how to care for yourself after your procedure. Your health care provider may also give you more specific instructions. If you have problems or questions, contact your health care provider. What can I expect after the procedure? After the procedure, it is common to have:  Bruising and tenderness at the catheter insertion area. Follow these instructions at home: Medicines  Take over-the-counter and prescription medicines only as told by your health care provider. Insertion site care  Follow instructions from your health care provider about how to take care of your insertion site. Make sure you: ? Wash your hands with soap and water before you change your bandage (dressing). If soap and water are not available, use hand sanitizer. ? Change your dressing as told by your health care provider. ? Leave stitches (sutures), skin glue, or adhesive strips in place. These skin closures may need to stay in place for 2 weeks or longer. If adhesive strip edges start to loosen and curl up, you may trim the loose edges. Do not remove adhesive strips completely unless your health care provider tells you to do that.  Check your insertion site every day for signs of infection. Check for: ? Redness, swelling, or pain. ? Fluid or blood. ? Pus or a bad smell. ? Warmth.  Do not take baths, swim, or use a hot tub until your health care provider approves.  You may shower 24-48 hours after the procedure, or as directed by your health care provider. ? Remove the dressing and gently wash the site with plain soap and water. ? Pat the area dry with a clean towel. ? Do not rub the site. That could cause bleeding.  Do not apply powder or lotion to the site. Activity  For 24 hours after the procedure, or as directed by your health care provider: ? Do not flex or bend the affected arm. ? Do not push or pull heavy objects with the affected arm. ? Do not drive  yourself home from the hospital or clinic. You may drive 24 hours after the procedure unless your health care provider tells you not to. ? Do not operate machinery or power tools.  Do not lift anything that is heavier than 10 lb (4.5 kg), or the limit that you are told, until your health care provider says that it is safe.  Ask your health care provider when it is okay to: ? Return to work or school. ? Resume usual physical activities or sports. ? Resume sexual activity.   General instructions  If the catheter site starts to bleed, raise your arm and put firm pressure on the site. If the bleeding does not stop, get help right away. This is a medical emergency.  If you went home on the same day as your procedure, a responsible adult should be with you for the first 24 hours after you arrive home.  Keep all follow-up visits as told by your health care provider. This is important. Contact a health care provider if:  You have a fever.  You have redness, swelling, or yellow drainage around your insertion site. Get help right away if:  You have unusual pain at the radial site.  The catheter insertion area swells very fast.  The insertion area is bleeding, and the bleeding does not stop when you hold steady pressure on the area.  Your arm or hand becomes pale, cool, tingly, or numb. These symptoms may represent a serious   problem that is an emergency. Do not wait to see if the symptoms will go away. Get medical help right away. Call your local emergency services (911 in the [place] Do not drive yourself to the hospital. Summary  After the procedure, it is common to have bruising and tenderness at the site.  Follow instructions from your health care provider about how to take care of your radial site wound. Check the wound every day for signs of infection.  Do not lift anything that is heavier than 10 lb (4.5 kg), or the limit that you are told, until your health care provider says that it  is safe. This information is not intended to replace advice given to you by your health care provider. Make sure you discuss any questions you have with your health care provider. Document Revised: 12/28/2017 Document Reviewed: 12/28/2017 Elsevier Patient Education  2021 [employer]  

## 2021-02-16 NOTE — Interval H&P Note (Signed)
History and Physical Interval Note:  02/16/2021 7:39 AM  [name]  has presented today for surgery, with the diagnosis of cad.  The various methods of treatment have been discussed with the patient and family. After consideration of risks, benefits and other options for treatment, the patient has consented to  Procedure(s): LEFT HEART CATH AND CORONARY ANGIOGRAPHY (N/A) as a surgical intervention.  The patient's history has been reviewed, patient examined, no change in status, stable for surgery.  I have reviewed the patient's chart and labs.  Questions were answered to the patient's satisfaction.     Sherren Mocha

## 2021-02-17 ENCOUNTER — Encounter (HOSPITAL_COMMUNITY): Payer: Self-pay | Admitting: Cardiovascular Disease

## 2021-02-17 MED FILL — Heparin Sod (Porcine)-NaCl IV Soln 1000 Unit/500ML-0.9%: INTRAVENOUS | Qty: 500 | Status: AC

## 2021-02-18 ENCOUNTER — Telehealth: Payer: Self-pay | Admitting: Internal Medicine

## 2021-02-18 DIAGNOSIS — R300 Dysuria: Secondary | ICD-10-CM

## 2021-02-18 NOTE — Telephone Encounter (Signed)
Patient has been having some frequent urination and some pain and is wondering if he can go to the lab and give a speciman so he can get some medication.

## 2021-02-18 NOTE — Telephone Encounter (Signed)
Ok urine studies ordered  Ok to go to the [facility] as there is no appt needed there

## 2021-02-19 ENCOUNTER — Other Ambulatory Visit: Payer: Self-pay

## 2021-02-19 ENCOUNTER — Other Ambulatory Visit (INDEPENDENT_AMBULATORY_CARE_PROVIDER_SITE_OTHER): Payer: HMO

## 2021-02-19 ENCOUNTER — Encounter: Payer: Self-pay | Admitting: Internal Medicine

## 2021-02-19 ENCOUNTER — Other Ambulatory Visit: Payer: Self-pay | Admitting: Internal Medicine

## 2021-02-19 DIAGNOSIS — R300 Dysuria: Secondary | ICD-10-CM | POA: Diagnosis not present

## 2021-02-19 LAB — URINALYSIS, ROUTINE W REFLEX MICROSCOPIC
Bilirubin Urine: NEGATIVE
Ketones, ur: NEGATIVE
Nitrite: NEGATIVE
Specific Gravity, Urine: 1.025 (ref 1.000–1.030)
Urine Glucose: NEGATIVE
Urobilinogen, UA: 0.2 (ref 0.0–1.0)
pH: 5.5 (ref 5.0–8.0)

## 2021-02-19 MED ORDER — CIPROFLOXACIN HCL 500 MG PO TABS: 500.0000 mg | ORAL_TABLET | Freq: Two times a day (BID) | ORAL | 0 refills | Status: AC

## 2021-02-19 NOTE — Telephone Encounter (Signed)
Patient notified to go to [facility] for Urine study.

## 2021-02-20 ENCOUNTER — Other Ambulatory Visit: Payer: Self-pay | Admitting: Internal Medicine

## 2021-02-21 ENCOUNTER — Encounter: Payer: Self-pay | Admitting: Internal Medicine

## 2021-02-21 LAB — URINE CULTURE

## 2021-02-25 NOTE — Progress Notes (Unsigned)
Cardiology Office Note:    Date:  02/27/2021   ID:  [name], [name] Jan 15, 1956, MRN [id number]  PCP:  Biagio Borg, MD   [place]  Cardiologist:  Ena Dawley, MD  Advanced Practice Provider:  No care team member to display Electrophysiologist:  None    Referring MD: Biagio Borg, MD    History of Present Illness:    [name] is a 65 y.o. male with a hx of HTN, DM, CAD with Coronary CTA 07/2015 demonstrating a calcium score 1186, diffuse moderate CAD with a long segment of calcified plaque in the proximal to mid LAD associated with 94 to 69% stenosis, obesity, HLD, recurrent DVT's and PE on chronic anticoagulation who was previously followed by Dr. Meda Coffee who now presents to clinic for follow-up.  Last seen in clinic on 02/10/21 as an urgent visit for chest pain,nausea and diaphoresis. We recommended coronary angiography which he underwent on 02/16/21. Cath showed moderate, non-obstructive CAD.  The patient states that he feels well today. No chest pain or shortness of breath. Remains active and feels well. Hoping to stop his imdur given his cath results. No HA, lightheadedness, dizziness, LE edema or orthopnea. AC has been resumed following cath.   Past Medical History:  Diagnosis Date  . Arthritis   . BACK PAIN   . CAD (coronary artery disease) 06/28/2017  . CAD in native artery    a. moderate by cardiac CT 2016.  Marland Kitchen Chronic bilateral low back pain with bilateral sciatica 01/12/2019  . Degenerative arthritis of right knee 07/15/2016   Injected in 07/15/2016 Orthovisc injection started 10/07/2016 DepoMedrol 40 inj on 04/11/17, 25 cc aspirated Orthovisc injection in 05/30/2017    . DEGENERATIVE JOINT DISEASE, KNEES, BILATERAL   . Depression   . Diabetes ([place]) 10/01/2014  . Diabetes mellitus    type II  . DIVERTICULOSIS, COLON   . DVT, lower extremity ([place])    right  . History of kidney stones   . Hyperlipidemia   . Hypertension   .  HYPOGONADISM, MALE   . HYPOTENSION, ORTHOSTATIC   . Kidney stones   . Long term (current) use of anticoagulants 08/17/2017  . Long term current use of anticoagulant   . Lumbosacral radiculopathy at S1 06/14/2012  . Maxillary sinus cyst 12/11/2018  . Obesity   . OBESITY, MORBID 03/04/2008   Qualifier: Diagnosis of  By: Jenny Reichmann MD, [place] Oris   . OSA (obstructive sleep apnea) 09/11/2013  . Pulmonary emboli ([place])   . Pulmonary embolism ([place]) 02/05/2011  . RBBB 07/28/2017  . Renal insufficiency    a. prior h/o, does not appear chronic.  . Superficial phlebitis of arm 03/18/2016  . Vitamin D deficiency 07/09/2020    Past Surgical History:  Procedure Laterality Date  . ankle surgury  2001 bilateral   with bone spurs and torn tendon  . COLONOSCOPY  03/18/2010  . EDG  2008   chronic Duodenitis  . GASTRIC BYPASS    . HEEL SPUR SURGERY Bilateral 2005  . knee surgury  2000 & 2003    cartilage damage  . LEFT HEART CATH AND CORONARY ANGIOGRAPHY N/A 02/16/2021   Procedure: LEFT HEART CATH AND CORONARY ANGIOGRAPHY;  Surgeon: Sherren Mocha, MD;  Location: [facility];  Service: Cardiovascular;  Laterality: N/A;  . LUMBAR LAMINECTOMY/DECOMPRESSION MICRODISCECTOMY  09/28/2012   Procedure: LUMBAR LAMINECTOMY/DECOMPRESSION MICRODISCECTOMY 2 LEVELS;  Surgeon: [name], MD;  Location: [facility];  Service: Orthopedics;  Laterality: Left;  Decompressive laminectomy L4-L5. Microdiscectomy L5-S1  . Skin removal surgery     extra abdominal skin removed from his weight loss  . TONSILLECTOMY AND ADENOIDECTOMY  age 60 or 34  . TOTAL KNEE ARTHROPLASTY  01/10/2012   Procedure: TOTAL KNEE ARTHROPLASTY;  Surgeon: [name], MD;  Location: [facility];  Service: Orthopedics;  Laterality: Left;  . TOTAL KNEE ARTHROPLASTY Right 11/18/2020   Procedure: TOTAL KNEE ARTHROPLASTY;  Surgeon: Paralee Cancel, MD;  Location: [facility];  Service: Orthopedics;  Laterality: Right;  70 mins  . UPPER GASTROINTESTINAL ENDOSCOPY   03/18/2010    Current Medications: Current Meds  Medication Sig  . Artificial Tear Ointment (ARTIFICIAL TEARS) ointment Place 1 drop into both eyes every 4 (four) hours as needed (dry eyes).   Marland Kitchen atorvastatin (LIPITOR) 80 MG tablet Take 1 tablet (80 mg total) by mouth daily.  . chlorthalidone (HYGROTON) 25 MG tablet TAKE 1 TABLET BY MOUTH EVERY DAY  . Cholecalciferol (VITAMIN D3) 125 MCG (5000 UT) CAPS Take 5,000 Units by mouth daily.   . ciprofloxacin (CIPRO) 500 MG tablet Take 1 tablet (500 mg total) by mouth 2 (two) times daily for 10 days.  . Cyanocobalamin (VITAMIN B-12) 5000 MCG SUBL Place 5,000 mcg under the tongue daily.  Marland Kitchen enoxaparin (LOVENOX) 120 MG/0.8ML injection INJECT 0.8 MLS (120 MG TOTAL) INTO THE SKIN EVERY 12 (TWELVE) HOURS FOR 13 DOSES.  Marland Kitchen escitalopram (LEXAPRO) 10 MG tablet TAKE 1 TABLET BY MOUTH EVERY DAY  . ferrous sulfate 325 (65 FE) MG tablet Take 325 mg by mouth daily with breakfast.  . fluticasone (FLONASE) 50 MCG/ACT nasal spray Place 2 sprays into both nostrils daily as needed for allergies.  Marland Kitchen glipiZIDE (GLUCOTROL XL) 2.5 MG 24 hr tablet Take 1 tablet (2.5 mg total) by mouth daily with breakfast.  . metFORMIN (GLUCOPHAGE) 500 MG tablet Take 4 tablets (2,000 mg total) by mouth daily with breakfast. (Patient taking differently: Take 1,000 mg by mouth daily with breakfast.)  . Multiple Vitamin (MULTIVITAMIN WITH MINERALS) TABS tablet Take 1 tablet by mouth daily at 12 noon.  . nitroGLYCERIN (NITROSTAT) 0.4 MG SL tablet Place 1 tablet (0.4 mg total) under the tongue every 5 (five) minutes as needed for chest pain.  . pantoprazole (PROTONIX) 40 MG tablet Take 1 tablet (40 mg total) by mouth daily.  Marland Kitchen warfarin (COUMADIN) 10 MG tablet Take 1 tablet daily except take 1 1/2 tablets on Wed or Take as directed by anticoagulation clinic (Patient taking differently: Take 10-15 mg by mouth See admin instructions. Take 1.5 tablets (15 mg) by mouth on Tuesdays, then take 1 tablet (10  mg) by mouth on all other days.)  . [DISCONTINUED] isosorbide mononitrate (IMDUR) 30 MG 24 hr tablet Take 1 tablet (30 mg total) by mouth daily.     Allergies:   Adhesive [tape] and Other   Social History   Socioeconomic History  . Marital status: Married    Spouse name: Not on file  . Number of children: 2  . Years of education: Not on file  . Highest education level: High school graduate  Occupational History  . Occupation: Retired    Fish farm manager: [employer]  Tobacco Use  . Smoking status: Former Smoker    Packs/day: 0.25    Years: 5.00    Pack years: 1.25    Quit date: 12/06/1978    Years since quitting: 42.2  . Smokeless tobacco: Never Used  Vaping Use  . Vaping Use: Never used  Substance and Sexual Activity  . Alcohol use: Yes    Alcohol/week: 2.0 standard drinks    Types: 2 Standard drinks or equivalent per week  . Drug use: No  . Sexual activity: Not Currently  Other Topics Concern  . Not on file  Social History Narrative   Daily caffeine use one per day   Protein drinks    Children; a friend and sister that helps   Brother that is around [place])   Son and dtr in [place]   Lives w/ wife in [place]   Left handed     Retired from [employer]   Former smoker 2 alcoholic beverages daily no caffeine no drug use no other tobacco at this time   Social Determinants of Radio broadcast assistant Strain: Low Risk   . Difficulty of Paying Living Expenses: Not hard at all  Food Insecurity: No Food Insecurity  . Worried About Charity fundraiser in the Last Year: Never true  . Ran Out of Food in the Last Year: Never true  Transportation Needs: No Transportation Needs  . Lack of Transportation (Medical): No  . Lack of Transportation (Non-Medical): No  Physical Activity: Sufficiently Active  . Days of Exercise per Week: 5 days  . Minutes of Exercise per Session: 30 min  Stress: No Stress Concern Present  . Feeling of Stress : Not at all  Social Connections:  Socially Integrated  . Frequency of Communication with Friends and Family: More than three times a week  . Frequency of Social Gatherings with Friends and Family: More than three times a week  . Attends Religious Services: More than 4 times per year  . Active Member of Clubs or Organizations: Yes  . Attends Archivist Meetings: More than 4 times per year  . Marital Status: Married     Family History: The patient's family history includes Diabetes in his brother, sister, sister, and sister; Heart failure in his sister; Hypertension in his mother; Prostate cancer in his father. There is no history of Colon cancer, Colon polyps, Esophageal cancer, Rectal cancer, or Stomach cancer.  ROS:   Please see the history of present illness.    Review of Systems  Constitutional: Negative for chills and fever.  HENT: Negative for hearing loss.   Eyes: Negative for blurred vision and redness.  Respiratory: Negative for shortness of breath.   Cardiovascular: Negative for chest pain, palpitations, orthopnea, claudication, leg swelling and PND.  Gastrointestinal: Negative for melena, nausea and vomiting.  Genitourinary: Negative for dysuria and flank pain.  Musculoskeletal: Negative for falls and myalgias.  Neurological: Negative for dizziness and loss of consciousness.  Psychiatric/Behavioral: Negative for substance abuse.    EKGs/Labs/Other Studies Reviewed:    The following studies were reviewed today: Cath 02/16/21: 1. Moderate calcific, nonobstructive proximal/mid LAD stenosis estimated at 50% 2. Mild nonobstructive left main, RCA, ramus intermedius, and LCx stenosis with no significant lesions in any of those vessels 3. Normal LVEDP  Recommend:  Ongoing medical therapy - LAD stenosis appears 50% non-obstructive, similar to report of LAD on 2016 coronary CTA  Resume warfarin today as instructed by anticoag clinic  Resume lovenox tomorrow morning if bridging   Coronary CTA  07/30/2015 IMPRESSION: 1. Coronary calcium score of 1186. This was 85 percentile for age and sex matched control.  2. Normal coronary origin.  3. There is diffuse moderate CAD, with a long segment of calcified plaque in the proximal to mid LAD associated with 50-69% stenosis.  No obstructive plaque was seen.  An aggressive medical therapy is recommended.  TTE 11/28/2018: Study Conclusions  - Left ventricle: The cavity size was normal. There was mild  concentric hypertrophy. Systolic function was normal. The  estimated ejection fraction was in the range of 60% to 65%. Wall  motion was normal; there were no regional wall motion  abnormalities. Doppler parameters are consistent with abnormal  left ventricular relaxation (grade 1 diastolic dysfunction).  Doppler parameters are consistent with indeterminate ventricular  filling pressure.  - Aortic valve: Transvalvular velocity was within the normal range.  There was no stenosis. There was no regurgitation. Valve area  (VTI): 2.67 cm^2. Valve area (Vmean): 3.07 cm^2.  - Aorta: Ascending aortic diameter: 41 mm (S).  - Ascending aorta: The ascending aorta was mildly dilated.  - Mitral valve: Transvalvular velocity was within the normal range.  There was no evidence for stenosis. There was no regurgitation.  - Right ventricle: The cavity size was normal. Wall thickness was  normal. Systolic function was normal.  - Tricuspid valve: There was no regurgitation.   Recent Labs: 12/12/2020: ALT 19 02/10/2021: BUN 28; Creatinine, Ser 1.30; Hemoglobin 13.0; Platelets 285; Potassium 4.7; Sodium 137  Recent Lipid Panel    Component Value Date/Time   CHOL 134 12/12/2020 1602   TRIG 115.0 12/12/2020 1602   HDL 36.70 (L) 12/12/2020 1602   CHOLHDL 4 12/12/2020 1602   VLDL 23.0 12/12/2020 1602   LDLCALC 74 12/12/2020 1602   LDLCALC 74 07/09/2020 1033   LDLDIRECT 116.0 01/15/2020 1624     Physical Exam:    VS:  BP  110/80 (BP Location: Left Arm, Patient Position: Sitting, Cuff Size: Normal)   Pulse 72   Ht 6\' 2"  (1.88 m)   Wt 256 lb (116.1 kg)   SpO2 97%   BMI 32.87 kg/m     Wt Readings from Last 3 Encounters:  02/27/21 256 lb (116.1 kg)  02/16/21 255 lb (115.7 kg)  02/10/21 262 lb 3.2 oz (118.9 kg)     GEN:  Well nourished, well developed in no acute distress HEENT: Normal NECK: No JVD; No carotid bruits CARDIAC: RRR, no murmurs, rubs, gallops RESPIRATORY:  Clear to auscultation without rales, wheezing or rhonchi  ABDOMEN: Soft, non-tender, non-distended MUSCULOSKELETAL:  No edema; No deformity  SKIN: Warm and dry NEUROLOGIC:  Alert and oriented x 3 PSYCHIATRIC:  Normal affect   ASSESSMENT:    1. Coronary artery disease involving native coronary artery of native heart without angina pectoris   2. Hyperlipidemia, unspecified hyperlipidemia type   3. Type 2 diabetes mellitus without complication, without long-term current use of insulin ([place])   4. Essential hypertension    PLAN:    In order of problems listed above:  #Moderate CAD: Coronary CTA in 2016 with calcium score 1186 which is 67 percentile for age and sex match control with 2 to 69% proximal to mid LAD. With recent episode of chest pain prompting coronary angiography which showed moderate disease. Plan for continued medical management. -Follow-up TTE -Hold ASA as patient on warfarin -Continue lipitor 80mg  daily -Stop imdur given reassuring cath results  #Recurrent DVTs/PE: -Resume on warfarin -Management per [facility] clinic  #DMII: Managed by PCP. -Continue metformin and glipizide  #HTN: Controlled.  -Continue chlorthalidone 25mg  daily -Consider adding BB at next visit pending BP and HR  #HLD: -Continue lipitor 40mg  daily     Medication Adjustments/Labs and Tests Ordered: Current medicines are reviewed at length with the patient today.  Concerns regarding medicines are outlined above.  No orders of the  defined types were placed in this encounter.  No orders of the defined types were placed in this encounter.   Patient Instructions  Medication Instructions:  Your physician has recommended you make the following change in your medication: Stop Imdur and Aspirin *If you need a refill on your cardiac medications before your next appointment, please call your pharmacy*   Lab Work: none If you have labs (blood work) drawn today and your tests are completely normal, you will receive your results only by: Marland Kitchen MyChart Message (if you have MyChart) OR . A paper copy in the mail If you have any lab test that is abnormal or we need to change your treatment, we will call you to review the results.   Testing/Procedures: none   Follow-Up: At [facility], you and your health needs are our priority.  As part of our continuing mission to provide you with exceptional heart care, we have created designated Provider Care Teams.  These Care Teams include your primary Cardiologist (physician) and Advanced Practice Providers (APPs -  Physician Assistants and Nurse Practitioners) who all work together to provide you with the care you need, when you need it.  We recommend signing up for the patient portal called "MyChart".  Sign up information is provided on this After Visit Summary.  MyChart is used to connect with patients for Virtual Visits (Telemedicine).  Patients are able to view lab/test results, encounter notes, upcoming appointments, etc.  Non-urgent messages can be sent to your provider as well.   To learn more about what you can do with MyChart, go to [email or web address].    Your next appointment:   6 month(s)  The format for your next appointment:   In Person  Provider:   You will see one of the following Advanced Practice Providers on your designated Care Team:    Richardson Dopp, PA-C  Robbie Lis, [place]   Other Instructions      Signed, Freada Bergeron, MD  02/27/2021  11:11 AM    [place]

## 2021-02-27 ENCOUNTER — Telehealth: Payer: Self-pay | Admitting: Internal Medicine

## 2021-02-27 ENCOUNTER — Ambulatory Visit: Payer: HMO | Admitting: Cardiology

## 2021-02-27 ENCOUNTER — Encounter: Payer: Self-pay | Admitting: Cardiology

## 2021-02-27 ENCOUNTER — Other Ambulatory Visit: Payer: Self-pay

## 2021-02-27 VITALS — BP 110/80 | HR 72 | Ht 74.0 in | Wt 256.0 lb

## 2021-02-27 DIAGNOSIS — I2510 Atherosclerotic heart disease of native coronary artery without angina pectoris: Secondary | ICD-10-CM | POA: Diagnosis not present

## 2021-02-27 DIAGNOSIS — E785 Hyperlipidemia, unspecified: Secondary | ICD-10-CM | POA: Diagnosis not present

## 2021-02-27 DIAGNOSIS — E1122 Type 2 diabetes mellitus with diabetic chronic kidney disease: Secondary | ICD-10-CM

## 2021-02-27 DIAGNOSIS — N1831 Chronic kidney disease, stage 3a: Secondary | ICD-10-CM

## 2021-02-27 DIAGNOSIS — I10 Essential (primary) hypertension: Secondary | ICD-10-CM

## 2021-02-27 DIAGNOSIS — N183 Chronic kidney disease, stage 3 unspecified: Secondary | ICD-10-CM

## 2021-02-27 DIAGNOSIS — E119 Type 2 diabetes mellitus without complications: Secondary | ICD-10-CM | POA: Diagnosis not present

## 2021-02-27 DIAGNOSIS — Z0000 Encounter for general adult medical examination without abnormal findings: Secondary | ICD-10-CM

## 2021-02-27 DIAGNOSIS — E538 Deficiency of other specified B group vitamins: Secondary | ICD-10-CM

## 2021-02-27 DIAGNOSIS — E559 Vitamin D deficiency, unspecified: Secondary | ICD-10-CM

## 2021-02-27 NOTE — Patient Instructions (Signed)
Medication Instructions:  Your physician has recommended you make the following change in your medication: Stop Imdur and Aspirin *If you need a refill on your cardiac medications before your next appointment, please call your pharmacy*   Lab Work: none If you have labs (blood work) drawn today and your tests are completely normal, you will receive your results only by: Marland Kitchen MyChart Message (if you have MyChart) OR . A paper copy in the mail If you have any lab test that is abnormal or we need to change your treatment, we will call you to review the results.   Testing/Procedures: none   Follow-Up: At [facility], you and your health needs are our priority.  As part of our continuing mission to provide you with exceptional heart care, we have created designated Provider Care Teams.  These Care Teams include your primary Cardiologist (physician) and Advanced Practice Providers (APPs -  Physician Assistants and Nurse Practitioners) who all work together to provide you with the care you need, when you need it.  We recommend signing up for the patient portal called "MyChart".  Sign up information is provided on this After Visit Summary.  MyChart is used to connect with patients for Virtual Visits (Telemedicine).  Patients are able to view lab/test results, encounter notes, upcoming appointments, etc.  Non-urgent messages can be sent to your provider as well.   To learn more about what you can do with MyChart, go to [email or web address].    Your next appointment:   6 month(s)  The format for your next appointment:   In Person  Provider:   You will see one of the following Advanced Practice Providers on your designated Care Team:    Richardson Dopp, PA-C  Robbie Lis, [place]   Other Instructions

## 2021-02-27 NOTE — Telephone Encounter (Signed)
Pt has appt with Dr. Jenny Reichmann 4/8, appt with lab 4/5. I do not see any active lab orders for this pt.

## 2021-02-28 NOTE — Telephone Encounter (Signed)
done

## 2021-03-05 ENCOUNTER — Other Ambulatory Visit (HOSPITAL_COMMUNITY): Payer: HMO

## 2021-03-06 ENCOUNTER — Ambulatory Visit (HOSPITAL_COMMUNITY): Payer: HMO | Attending: Cardiology

## 2021-03-06 ENCOUNTER — Other Ambulatory Visit: Payer: Self-pay

## 2021-03-06 DIAGNOSIS — I25110 Atherosclerotic heart disease of native coronary artery with unstable angina pectoris: Secondary | ICD-10-CM | POA: Insufficient documentation

## 2021-03-06 LAB — ECHOCARDIOGRAM COMPLETE
AR max vel: 4.13 cm2
AV Area VTI: 4.15 cm2
AV Area mean vel: 3.98 cm2
AV Mean grad: 8 mmHg
AV Peak grad: 15.5 mmHg
Ao pk vel: 1.97 m/s
Area-P 1/2: 1.94 cm2
S' Lateral: 2.3 cm

## 2021-03-09 ENCOUNTER — Telehealth: Payer: Self-pay

## 2021-03-09 DIAGNOSIS — I714 Abdominal aortic aneurysm, without rupture, unspecified: Secondary | ICD-10-CM

## 2021-03-09 NOTE — Telephone Encounter (Signed)
Pt advised his results of his Echo and agrees to a CT to further evaluate his Aorta.

## 2021-03-09 NOTE — Telephone Encounter (Signed)
-----   Message from Freada Bergeron, MD sent at 03/09/2021 12:06 PM EDT ----- The pumping function of his heart is normal and his valves look good. His aorta is moderately dilated. To better assess this, can we get a CTA of the aorta to see the size? Thank you so much!

## 2021-03-10 ENCOUNTER — Other Ambulatory Visit: Payer: Self-pay

## 2021-03-10 ENCOUNTER — Other Ambulatory Visit (INDEPENDENT_AMBULATORY_CARE_PROVIDER_SITE_OTHER): Payer: HMO

## 2021-03-10 ENCOUNTER — Ambulatory Visit (INDEPENDENT_AMBULATORY_CARE_PROVIDER_SITE_OTHER): Payer: HMO | Admitting: General Practice

## 2021-03-10 DIAGNOSIS — Z0000 Encounter for general adult medical examination without abnormal findings: Secondary | ICD-10-CM | POA: Diagnosis not present

## 2021-03-10 DIAGNOSIS — E538 Deficiency of other specified B group vitamins: Secondary | ICD-10-CM

## 2021-03-10 DIAGNOSIS — E1122 Type 2 diabetes mellitus with diabetic chronic kidney disease: Secondary | ICD-10-CM

## 2021-03-10 DIAGNOSIS — E559 Vitamin D deficiency, unspecified: Secondary | ICD-10-CM | POA: Diagnosis not present

## 2021-03-10 DIAGNOSIS — N183 Chronic kidney disease, stage 3 unspecified: Secondary | ICD-10-CM | POA: Diagnosis not present

## 2021-03-10 DIAGNOSIS — Z7901 Long term (current) use of anticoagulants: Secondary | ICD-10-CM

## 2021-03-10 DIAGNOSIS — N1831 Chronic kidney disease, stage 3a: Secondary | ICD-10-CM | POA: Diagnosis not present

## 2021-03-10 LAB — CBC WITH DIFFERENTIAL/PLATELET
Basophils Absolute: 0 10*3/uL (ref 0.0–0.1)
Basophils Relative: 0.2 % (ref 0.0–3.0)
Eosinophils Absolute: 0.1 10*3/uL (ref 0.0–0.7)
Eosinophils Relative: 1.3 % (ref 0.0–5.0)
HCT: 38 % — ABNORMAL LOW (ref 39.0–52.0)
Hemoglobin: 12 g/dL — ABNORMAL LOW (ref 13.0–17.0)
Lymphocytes Relative: 32.6 % (ref 12.0–46.0)
Lymphs Abs: 2.2 10*3/uL (ref 0.7–4.0)
MCHC: 31.5 g/dL (ref 30.0–36.0)
MCV: 68.9 fl — ABNORMAL LOW (ref 78.0–100.0)
Monocytes Absolute: 0.6 10*3/uL (ref 0.1–1.0)
Monocytes Relative: 8.7 % (ref 3.0–12.0)
Neutro Abs: 3.8 10*3/uL (ref 1.4–7.7)
Neutrophils Relative %: 57.2 % (ref 43.0–77.0)
Platelets: 257 10*3/uL (ref 150.0–400.0)
RBC: 5.52 Mil/uL (ref 4.22–5.81)
RDW: 16.1 % — ABNORMAL HIGH (ref 11.5–15.5)
WBC: 6.7 10*3/uL (ref 4.0–10.5)

## 2021-03-10 LAB — URINALYSIS, ROUTINE W REFLEX MICROSCOPIC
Bilirubin Urine: NEGATIVE
Hgb urine dipstick: NEGATIVE
Ketones, ur: NEGATIVE
Leukocytes,Ua: NEGATIVE
Nitrite: NEGATIVE
RBC / HPF: NONE SEEN (ref 0–?)
Specific Gravity, Urine: 1.025 (ref 1.000–1.030)
Total Protein, Urine: NEGATIVE
Urine Glucose: NEGATIVE
Urobilinogen, UA: 0.2 (ref 0.0–1.0)
pH: 5.5 (ref 5.0–8.0)

## 2021-03-10 LAB — BASIC METABOLIC PANEL
BUN: 26 mg/dL — ABNORMAL HIGH (ref 6–23)
CO2: 30 mEq/L (ref 19–32)
Calcium: 9.3 mg/dL (ref 8.4–10.5)
Chloride: 103 mEq/L (ref 96–112)
Creatinine, Ser: 1.26 mg/dL (ref 0.40–1.50)
GFR: 59.14 mL/min — ABNORMAL LOW (ref >60.00)
Glucose, Bld: 100 mg/dL — ABNORMAL HIGH (ref 70–99)
Potassium: 4.4 mEq/L (ref 3.5–5.1)
Sodium: 141 mEq/L (ref 135–145)

## 2021-03-10 LAB — MICROALBUMIN / CREATININE URINE RATIO
Creatinine,U: 141.6 mg/dL
Microalb Creat Ratio: 1.1 mg/g (ref 0.0–30.0)
Microalb, Ur: 1.6 mg/dL (ref 0.0–1.9)

## 2021-03-10 LAB — HEPATIC FUNCTION PANEL
ALT: 23 U/L (ref 0–53)
AST: 17 U/L (ref 0–37)
Albumin: 4.2 g/dL (ref 3.5–5.2)
Alkaline Phosphatase: 65 U/L (ref 39–117)
Bilirubin, Direct: 0.1 mg/dL (ref 0.0–0.3)
Total Bilirubin: 0.5 mg/dL (ref 0.2–1.2)
Total Protein: 6.6 g/dL (ref 6.0–8.3)

## 2021-03-10 LAB — LIPID PANEL
Cholesterol: 93 mg/dL (ref 0–200)
HDL: 30.2 mg/dL — ABNORMAL LOW (ref >39.00)
LDL Cholesterol: 46 mg/dL (ref 0–99)
NonHDL: 62.87
Total CHOL/HDL Ratio: 3
Triglycerides: 83 mg/dL (ref 0.0–149.0)
VLDL: 16.6 mg/dL (ref 0.0–40.0)

## 2021-03-10 LAB — VITAMIN D 25 HYDROXY (VIT D DEFICIENCY, FRACTURES): VITD: 45.09 ng/mL (ref 30.00–100.00)

## 2021-03-10 LAB — HEMOGLOBIN A1C: Hgb A1c MFr Bld: 7.8 % — ABNORMAL HIGH (ref 4.6–6.5)

## 2021-03-10 LAB — POCT INR: INR: 3.3 — AB (ref 2.0–3.0)

## 2021-03-10 LAB — VITAMIN B12: Vitamin B-12: >1506 pg/mL — ABNORMAL HIGH (ref 211–911)

## 2021-03-10 LAB — PHOSPHORUS: Phosphorus: 3.5 mg/dL (ref 2.3–4.6)

## 2021-03-10 LAB — TSH: TSH: 1.54 u[IU]/mL (ref 0.35–4.50)

## 2021-03-10 LAB — PSA: PSA: 4.02 ng/mL — ABNORMAL HIGH (ref 0.10–4.00)

## 2021-03-10 NOTE — Patient Instructions (Signed)
Pre visit review using our clinic review tool, if applicable. No additional management support is needed unless otherwise documented below in the visit note.  Skip coumadin tomorrow (4/6) and then continue to take 1 tablet daily except 1 1/2 tablets every Tuesday.  Re-check in 3 weeks.

## 2021-03-10 NOTE — Progress Notes (Signed)
Medical screening examination/treatment/procedure(s) were performed by non-physician practitioner and as supervising physician I was immediately available for consultation/collaboration. I agree with above. Jalynne Persico, MD   

## 2021-03-13 ENCOUNTER — Other Ambulatory Visit: Payer: Self-pay

## 2021-03-13 ENCOUNTER — Encounter: Payer: Self-pay | Admitting: Internal Medicine

## 2021-03-13 ENCOUNTER — Ambulatory Visit (INDEPENDENT_AMBULATORY_CARE_PROVIDER_SITE_OTHER): Payer: HMO | Admitting: Internal Medicine

## 2021-03-13 VITALS — BP 120/78 | HR 60 | Ht 74.0 in | Wt 258.0 lb

## 2021-03-13 DIAGNOSIS — E1122 Type 2 diabetes mellitus with diabetic chronic kidney disease: Secondary | ICD-10-CM | POA: Diagnosis not present

## 2021-03-13 DIAGNOSIS — Z0000 Encounter for general adult medical examination without abnormal findings: Secondary | ICD-10-CM

## 2021-03-13 DIAGNOSIS — R972 Elevated prostate specific antigen [PSA]: Secondary | ICD-10-CM

## 2021-03-13 DIAGNOSIS — N1831 Chronic kidney disease, stage 3a: Secondary | ICD-10-CM | POA: Diagnosis not present

## 2021-03-13 DIAGNOSIS — E538 Deficiency of other specified B group vitamins: Secondary | ICD-10-CM

## 2021-03-13 DIAGNOSIS — N183 Chronic kidney disease, stage 3 unspecified: Secondary | ICD-10-CM | POA: Diagnosis not present

## 2021-03-13 DIAGNOSIS — E559 Vitamin D deficiency, unspecified: Secondary | ICD-10-CM | POA: Diagnosis not present

## 2021-03-13 NOTE — Patient Instructions (Signed)
Please work on your diet for the sugar  Please check with your insurance to see if the Shingrix shot is covered  Please try OTC metamucil to see if the stomach is improved, o/w you may need follow up with Dr Gareth Morgan to cut back on the B12 to Mon- Wed-Fri  Please take OTC  Vitamin D3 at 2000 units per day, indefinitely.  Your PSA will need to be checked at your next visit  Please continue all other medications as before, and refills have been done if requested.  Please have the pharmacy call with any other refills you may need.  Please continue your efforts at being more active, low cholesterol diet, and weight control.  You are otherwise up to date with prevention measures today.  Please keep your appointments with your specialists as you may have planned  Please make an Appointment to return in 6 months, or sooner if needed, also with Lab Appointment for testing done 3-5 days before at the [facility] (so this is for TWO appointments - please see the scheduling desk as you leave)  Due to the ongoing Covid 19 pandemic, our lab now requires an appointment for any labs done at our office.  If you need labs done and do not have an appointment, please call our office ahead of time to schedule before presenting to the lab for your testing.

## 2021-03-13 NOTE — Progress Notes (Signed)
Patient ID: [name], male   DOB: 1956/05/14, 65 y.o.   MRN: [id number]         Chief Complaint:: wellness exam and Follow-up  dm, s/p gastric bypass, low vit D and b12, increased PSA       HPI:  [name] is a 65 y.o. male here for wellness exam; due for eye exam soon with Dr Rodena Piety.  O/w up to date with preventive referrals and immunizations                        Also s/p right knee TKR and doing much better with pain, had some situational depression he thinks during the rehab but mostly resolved now.  Pt denies chest pain, increased sob or doe, wheezing, orthopnea, PND, increased LE swelling, palpitations, dizziness or syncope.   Pt denies polydipsia, polyuria,   Pt states overall good compliance with meds, but has been less trying to follow lower cholesterol, diabetic diet, wt overall stable but little exercise however.   Denies new focal neuro s/s.   Pt denies fever, wt loss, night sweats, loss of appetite, or other constitutional symptoms  Is taking vit d and b12.  Had recent UTI now Denies urinary symptoms such as dysuria, frequency, urgency, flank pain, hematuria or n/v, fever, chills.  Does have intermittent gastric rumbling and loose stools with eating sometimes s/p roux n y gastric bypass, plans to fu with Dr Coralyn Helling.     Wt Readings from Last 3 Encounters:  03/13/21 258 lb (117 kg)  02/27/21 256 lb (116.1 kg)  02/16/21 255 lb (115.7 kg)   BP Readings from Last 3 Encounters:  03/13/21 120/78  02/27/21 110/80  02/16/21 110/67   Immunization History  Administered Date(s) Administered  . Fluad Quad(high Dose 65+) 10/05/2019, 10/07/2020  . Influenza Split 08/19/2011, 10/30/2012  . Influenza Whole 09/21/2005, 09/02/2008, 12/24/2009  . Influenza,inj,Quad PF,6+ Mos 09/13/2013, 09/30/2014, 10/29/2015, 11/15/2016, 09/23/2017, 09/19/2018  . PFIZER(Purple Top)SARS-COV-2 Vaccination 01/03/2020, 01/27/2020, 10/31/2020  . Pneumococcal Conjugate-13 03/13/2014  .  Pneumococcal Polysaccharide-23 03/04/2008, 06/28/2016, 11/28/2018  . Td 03/11/2009  . Tdap 07/13/2019  . Zoster 10/29/2015   There are no preventive care reminders to display for this patient.    Past Medical History:  Diagnosis Date  . Arthritis   . BACK PAIN   . CAD (coronary artery disease) 06/28/2017  . CAD in native artery    a. moderate by cardiac CT 2016.  Marland Kitchen Chronic bilateral low back pain with bilateral sciatica 01/12/2019  . Degenerative arthritis of right knee 07/15/2016   Injected in 07/15/2016 Orthovisc injection started 10/07/2016 DepoMedrol 40 inj on 04/11/17, 25 cc aspirated Orthovisc injection in 05/30/2017    . DEGENERATIVE JOINT DISEASE, KNEES, BILATERAL   . Depression   . Diabetes ([place]) 10/01/2014  . Diabetes mellitus    type II  . DIVERTICULOSIS, COLON   . DVT, lower extremity ([place])    right  . History of kidney stones   . Hyperlipidemia   . Hypertension   . HYPOGONADISM, MALE   . HYPOTENSION, ORTHOSTATIC   . Kidney stones   . Long term (current) use of anticoagulants 08/17/2017  . Long term current use of anticoagulant   . Lumbosacral radiculopathy at S1 06/14/2012  . Maxillary sinus cyst 12/11/2018  . Obesity   . OBESITY, MORBID 03/04/2008   Qualifier: Diagnosis of  By: Jenny Reichmann MD, Hunt Oris   . OSA (obstructive sleep apnea) 09/11/2013  . Pulmonary  emboli ([place])   . Pulmonary embolism ([place]) 02/05/2011  . RBBB 07/28/2017  . Renal insufficiency    a. prior h/o, does not appear chronic.  . Superficial phlebitis of arm 03/18/2016  . Vitamin D deficiency 07/09/2020   Past Surgical History:  Procedure Laterality Date  . ankle surgury  2001 bilateral   with bone spurs and torn tendon  . COLONOSCOPY  03/18/2010  . EDG  2008   chronic Duodenitis  . GASTRIC BYPASS    . HEEL SPUR SURGERY Bilateral 2005  . knee surgury  2000 & 2003    cartilage damage  . LEFT HEART CATH AND CORONARY ANGIOGRAPHY N/A 02/16/2021   Procedure: LEFT HEART CATH AND CORONARY ANGIOGRAPHY;   Surgeon: Sherren Mocha, MD;  Location: [facility];  Service: Cardiovascular;  Laterality: N/A;  . LUMBAR LAMINECTOMY/DECOMPRESSION MICRODISCECTOMY  09/28/2012   Procedure: LUMBAR LAMINECTOMY/DECOMPRESSION MICRODISCECTOMY 2 LEVELS;  Surgeon: [name], MD;  Location: [facility];  Service: Orthopedics;  Laterality: Left;  Decompressive laminectomy L4-L5. Microdiscectomy L5-S1  . Skin removal surgery     extra abdominal skin removed from his weight loss  . TONSILLECTOMY AND ADENOIDECTOMY  age 6 or 63  . TOTAL KNEE ARTHROPLASTY  01/10/2012   Procedure: TOTAL KNEE ARTHROPLASTY;  Surgeon: [name], MD;  Location: [facility];  Service: Orthopedics;  Laterality: Left;  . TOTAL KNEE ARTHROPLASTY Right 11/18/2020   Procedure: TOTAL KNEE ARTHROPLASTY;  Surgeon: Paralee Cancel, MD;  Location: [facility];  Service: Orthopedics;  Laterality: Right;  70 mins  . UPPER GASTROINTESTINAL ENDOSCOPY  03/18/2010    reports that he quit smoking about 42 years ago. He has a 1.25 pack-year smoking history. He has never used smokeless tobacco. He reports current alcohol use of about 2.0 standard drinks of alcohol per week. He reports that he does not use drugs. family history includes Diabetes in his brother, sister, sister, and sister; Heart failure in his sister; Hypertension in his mother; Prostate cancer in his father. Allergies  Allergen Reactions  . Adhesive [Tape] Other (See Comments)    Painful, Please use "paper" tape  . Other     NO BLOOD -JEHOVAH'S WITNESS-SIGNED REFUSAL BUT WOULD TAKE ALBUMIN   Current Outpatient Medications on File Prior to Visit  Medication Sig Dispense Refill  . Artificial Tear Ointment (ARTIFICIAL TEARS) ointment Place 1 drop into both eyes every 4 (four) hours as needed (dry eyes).     Marland Kitchen atorvastatin (LIPITOR) 80 MG tablet Take 1 tablet (80 mg total) by mouth daily. 90 tablet 3  . chlorthalidone (HYGROTON) 25 MG tablet TAKE 1 TABLET BY MOUTH EVERY DAY 90 tablet 1  .  Cholecalciferol (VITAMIN D3) 125 MCG (5000 UT) CAPS Take 5,000 Units by mouth daily.     . Cyanocobalamin (VITAMIN B-12) 5000 MCG SUBL Place 5,000 mcg under the tongue daily.    Marland Kitchen escitalopram (LEXAPRO) 10 MG tablet TAKE 1 TABLET BY MOUTH EVERY DAY 90 tablet 1  . ferrous sulfate 325 (65 FE) MG tablet Take 325 mg by mouth daily with breakfast.    . fluticasone (FLONASE) 50 MCG/ACT nasal spray Place 2 sprays into both nostrils daily as needed for allergies.    Marland Kitchen glipiZIDE (GLUCOTROL XL) 2.5 MG 24 hr tablet Take 1 tablet (2.5 mg total) by mouth daily with breakfast. 90 tablet 3  . metFORMIN (GLUCOPHAGE) 500 MG tablet Take 4 tablets (2,000 mg total) by mouth daily with breakfast. (Patient taking differently: Take 1,000 mg by mouth daily  with breakfast.) 360 tablet 3  . Multiple Vitamin (MULTIVITAMIN WITH MINERALS) TABS tablet Take 1 tablet by mouth daily at 12 noon.    . nitroGLYCERIN (NITROSTAT) 0.4 MG SL tablet Place 1 tablet (0.4 mg total) under the tongue every 5 (five) minutes as needed for chest pain. 90 tablet 3  . pantoprazole (PROTONIX) 40 MG tablet Take 1 tablet (40 mg total) by mouth daily. 30 tablet 11  . warfarin (COUMADIN) 10 MG tablet Take 1 tablet daily except take 1 1/2 tablets on Wed or Take as directed by anticoagulation clinic (Patient taking differently: Take 10-15 mg by mouth See admin instructions. Take 1.5 tablets (15 mg) by mouth on Tuesdays, then take 1 tablet (10 mg) by mouth on all other days.) 105 tablet 1  . enoxaparin (LOVENOX) 120 MG/0.8ML injection INJECT 0.8 MLS (120 MG TOTAL) INTO THE SKIN EVERY 12 (TWELVE) HOURS FOR 13 DOSES. 1560 mL 0   No current facility-administered medications on file prior to visit.        ROS:  All others reviewed and negative.  Objective        PE:  BP 120/78 (BP Location: Left Arm, Patient Position: Sitting, Cuff Size: Large)   Pulse 60   Ht 6\' 2"  (1.88 m)   Wt 258 lb (117 kg)   SpO2 96%   BMI 33.13 kg/m                  Constitutional: Pt appears in NAD               HENT: Head: NCAT.                Right Ear: External ear normal.                 Left Ear: External ear normal.                Eyes: . Pupils are equal, round, and reactive to light. Conjunctivae and EOM are normal               Nose: without d/c or deformity               Neck: Neck supple. Gross normal ROM               Cardiovascular: Normal rate and regular rhythm.                 Pulmonary/Chest: Effort normal and breath sounds without rales or wheezing.                Abd:  Soft, NT, ND, + BS, no organomegaly               Neurological: Pt is alert. At baseline orientation, motor grossly intact               Skin: Skin is warm. No rashes, no other new lesions, LE edema - trace bilat               Psychiatric: Pt behavior is normal without agitation   Micro: none  Cardiac tracings I have personally interpreted today:  none  Pertinent Radiological findings (summarize): none   Lab Results  Component Value Date   WBC 6.7 03/10/2021   HGB 12.0 (L) 03/10/2021   HCT 38.0 (L) 03/10/2021   PLT 257.0 03/10/2021   GLUCOSE 100 (H) 03/10/2021   CHOL 93 03/10/2021   TRIG 83.0 03/10/2021   HDL 30.20 (L) 03/10/2021  LDLDIRECT 116.0 01/15/2020   LDLCALC 46 03/10/2021   ALT 23 03/10/2021   AST 17 03/10/2021   NA 141 03/10/2021   K 4.4 03/10/2021   CL 103 03/10/2021   CREATININE 1.26 03/10/2021   BUN 26 (H) 03/10/2021   CO2 30 03/10/2021   TSH 1.54 03/10/2021   PSA 4.02 (H) 03/10/2021   INR 3.3 (A) 03/10/2021   HGBA1C 7.8 (H) 03/10/2021   MICROALBUR 1.6 03/10/2021   Assessment/Plan:  SHANTA DORVIL is a 65 y.o. Black or African American [2] male with  has a past medical history of Arthritis, BACK PAIN, CAD (coronary artery disease) (06/28/2017), CAD in native artery, Chronic bilateral low back pain with bilateral sciatica (01/12/2019), Degenerative arthritis of right knee (07/15/2016), DEGENERATIVE JOINT DISEASE, KNEES, BILATERAL,  Depression, Diabetes ([place]) (10/01/2014), Diabetes mellitus, DIVERTICULOSIS, COLON, DVT, lower extremity ([place]), History of kidney stones, Hyperlipidemia, Hypertension, HYPOGONADISM, MALE, HYPOTENSION, ORTHOSTATIC, Kidney stones, Long term (current) use of anticoagulants (08/17/2017), Long term current use of anticoagulant, Lumbosacral radiculopathy at S1 (06/14/2012), Maxillary sinus cyst (12/11/2018), Obesity, OBESITY, MORBID (03/04/2008), OSA (obstructive sleep apnea) (09/11/2013), Pulmonary emboli ([place]), Pulmonary embolism ([place]) (02/05/2011), RBBB (07/28/2017), Renal insufficiency, Superficial phlebitis of arm (03/18/2016), and Vitamin D deficiency (07/09/2020).  Preventative health care Age and sex appropriate education and counseling updated with regular exercise and diet Referrals for preventative services - none needed Immunizations addressed - none needed Smoking counseling  - none needed Evidence for depression or other mood disorder - none significant Most recent labs reviewed. I have personally reviewed and have noted: 1) the patient's medical and social history 2) The patient's current medications and supplements 3) The patient's height, weight, and BMI have been recorded in the chart   Vitamin D deficiency Last vitamin D Lab Results  Component Value Date   VD25OH 45.09 03/10/2021   Stable, cont oral replacement   B12 deficiency Lab Results  Component Value Date   VITAMINB12 >1506 (H) 03/10/2021   Stable, cont oral replacement but ok to reduce to mon- we - fri  - b12 1000 mcg qd   Elevated PSA S/p recent UTI, will plan to repeat in 6 mo, asympt  Diabetes ([place]) With mild worsening a1c with less rigorous diet recently, pt state no med change needed, will work on better diet Lab Results  Component Value Date   HGBA1C 7.8 (H) 03/10/2021     CKD (chronic kidney disease) stage 3, GFR 30-59 ml/min ([place]) Lab Results  Component Value Date   CREATININE 1.26 03/10/2021   Stable  overall, cont to avoid nephrotoxins   Followup: Return in about 6 months (around 09/12/2021).  Cathlean Cower, MD 03/14/2021 4:18 AM [facility]

## 2021-03-14 ENCOUNTER — Encounter: Payer: Self-pay | Admitting: Internal Medicine

## 2021-03-14 DIAGNOSIS — N183 Chronic kidney disease, stage 3 unspecified: Secondary | ICD-10-CM | POA: Insufficient documentation

## 2021-03-14 LAB — PTH, INTACT AND CALCIUM
Calcium: 8.9 mg/dL (ref 8.6–10.3)
PTH: 73 pg/mL (ref 16–77)

## 2021-03-14 NOTE — Assessment & Plan Note (Signed)
S/p recent UTI, will plan to repeat in 6 mo, asympt

## 2021-03-14 NOTE — Assessment & Plan Note (Signed)

## 2021-03-14 NOTE — Assessment & Plan Note (Signed)
Lab Results  Component Value Date   CREATININE 1.26 03/10/2021   Stable overall, cont to avoid nephrotoxins

## 2021-03-14 NOTE — Assessment & Plan Note (Signed)
Last vitamin D Lab Results  Component Value Date   VD25OH 45.09 03/10/2021   Stable, cont oral replacement

## 2021-03-14 NOTE — Assessment & Plan Note (Signed)
With mild worsening a1c with less rigorous diet recently, pt state no med change needed, will work on better diet Lab Results  Component Value Date   HGBA1C 7.8 (H) 03/10/2021

## 2021-03-14 NOTE — Assessment & Plan Note (Signed)
Lab Results  Component Value Date   VITAMINB12 >1506 (H) 03/10/2021   Stable, cont oral replacement but ok to reduce to mon- we - fri  - b12 1000 mcg qd

## 2021-03-17 DIAGNOSIS — H52223 Regular astigmatism, bilateral: Secondary | ICD-10-CM | POA: Diagnosis not present

## 2021-03-17 DIAGNOSIS — H35413 Lattice degeneration of retina, bilateral: Secondary | ICD-10-CM | POA: Diagnosis not present

## 2021-03-17 DIAGNOSIS — H5213 Myopia, bilateral: Secondary | ICD-10-CM | POA: Diagnosis not present

## 2021-03-17 DIAGNOSIS — H2513 Age-related nuclear cataract, bilateral: Secondary | ICD-10-CM | POA: Diagnosis not present

## 2021-03-17 DIAGNOSIS — H43813 Vitreous degeneration, bilateral: Secondary | ICD-10-CM | POA: Diagnosis not present

## 2021-03-17 DIAGNOSIS — H35033 Hypertensive retinopathy, bilateral: Secondary | ICD-10-CM | POA: Diagnosis not present

## 2021-03-17 DIAGNOSIS — H524 Presbyopia: Secondary | ICD-10-CM | POA: Diagnosis not present

## 2021-03-17 LAB — HM DIABETES EYE EXAM

## 2021-03-23 ENCOUNTER — Other Ambulatory Visit: Payer: Self-pay

## 2021-03-23 ENCOUNTER — Ambulatory Visit (INDEPENDENT_AMBULATORY_CARE_PROVIDER_SITE_OTHER)
Admission: RE | Admit: 2021-03-23 | Discharge: 2021-03-23 | Disposition: A | Discharge status: Home / Self Care | Payer: HMO | Source: Ambulatory Visit | Attending: Cardiology | Admitting: Cardiology

## 2021-03-23 ENCOUNTER — Telehealth: Payer: Self-pay | Admitting: Internal Medicine

## 2021-03-23 ENCOUNTER — Encounter: Payer: Self-pay | Admitting: Internal Medicine

## 2021-03-23 DIAGNOSIS — I708 Atherosclerosis of other arteries: Secondary | ICD-10-CM | POA: Diagnosis not present

## 2021-03-23 DIAGNOSIS — I700 Atherosclerosis of aorta: Secondary | ICD-10-CM | POA: Diagnosis not present

## 2021-03-23 DIAGNOSIS — I2510 Atherosclerotic heart disease of native coronary artery without angina pectoris: Secondary | ICD-10-CM | POA: Diagnosis not present

## 2021-03-23 DIAGNOSIS — I714 Abdominal aortic aneurysm, without rupture, unspecified: Secondary | ICD-10-CM

## 2021-03-23 DIAGNOSIS — I712 Thoracic aortic aneurysm, without rupture: Secondary | ICD-10-CM | POA: Diagnosis not present

## 2021-03-23 MED ORDER — IOHEXOL 350 MG/ML SOLN
100.0000 mL | Freq: Once | INTRAVENOUS | Status: AC | PRN
  Administered 2021-03-23: 100 mL via INTRAVENOUS

## 2021-03-23 NOTE — Telephone Encounter (Signed)
error 

## 2021-03-23 NOTE — Telephone Encounter (Signed)
Patient dropped off a release of physical activity form.   Form has been completed &Placed in providers box to review and sign.

## 2021-03-24 ENCOUNTER — Telehealth: Payer: Self-pay | Admitting: *Deleted

## 2021-03-24 DIAGNOSIS — I714 Abdominal aortic aneurysm, without rupture, unspecified: Secondary | ICD-10-CM

## 2021-03-24 DIAGNOSIS — Z0189 Encounter for other specified special examinations: Secondary | ICD-10-CM

## 2021-03-24 NOTE — Telephone Encounter (Signed)
Pt made aware of CT Angio Chest Aorta results and to have this repeated in one year, per Dr. Johney Frame.  Informed the pt that I will go ahead and place the order for him to have this repeated in one year, and send our CT Scheduler a message to call him back and schedule this.  Pt verbalized understanding and agrees with this plan.

## 2021-03-24 NOTE — Telephone Encounter (Signed)
Form has been signed, Copy sent to scan.   Patient informed and will pick up original.

## 2021-03-24 NOTE — Telephone Encounter (Signed)
-----   Message from Freada Bergeron, MD sent at 03/24/2021  1:05 PM EDT ----- His CT scan shows that his aorta is mildly dilated at 4.1cm. We will continue to monitor yearly with imaging but no changes needed now.

## 2021-03-31 ENCOUNTER — Ambulatory Visit (INDEPENDENT_AMBULATORY_CARE_PROVIDER_SITE_OTHER): Payer: HMO | Admitting: General Practice

## 2021-03-31 ENCOUNTER — Other Ambulatory Visit: Payer: Self-pay

## 2021-03-31 DIAGNOSIS — Z7901 Long term (current) use of anticoagulants: Secondary | ICD-10-CM

## 2021-03-31 LAB — POCT INR: INR: 4.8 — AB (ref 2.0–3.0)

## 2021-03-31 NOTE — Patient Instructions (Addendum)
Pre visit review using our clinic review tool, if applicable. No additional management support is needed unless otherwise documented below in the visit note.  Skip coumadin tomorrow and Thursday (4/27 and 4/28) and then change dosage and take  1 tablet daily .  Re-check by in 2 weeks.  Add in some green vegetables (2 to 3 servings per week.).

## 2021-03-31 NOTE — Progress Notes (Signed)
Medical screening examination/treatment/procedure(s) were performed by non-physician practitioner and as supervising physician I was immediately available for consultation/collaboration. I agree with above. Emyah Roznowski, MD   

## 2021-04-02 DIAGNOSIS — R972 Elevated prostate specific antigen [PSA]: Secondary | ICD-10-CM | POA: Diagnosis not present

## 2021-04-02 DIAGNOSIS — R310 Gross hematuria: Secondary | ICD-10-CM | POA: Diagnosis not present

## 2021-04-02 DIAGNOSIS — R350 Frequency of micturition: Secondary | ICD-10-CM | POA: Diagnosis not present

## 2021-04-14 ENCOUNTER — Other Ambulatory Visit: Payer: Self-pay

## 2021-04-14 ENCOUNTER — Ambulatory Visit (INDEPENDENT_AMBULATORY_CARE_PROVIDER_SITE_OTHER): Payer: HMO | Admitting: General Practice

## 2021-04-14 DIAGNOSIS — Z5181 Encounter for therapeutic drug level monitoring: Secondary | ICD-10-CM | POA: Diagnosis not present

## 2021-04-14 LAB — POCT INR: INR: 2.8 (ref 2.0–3.0)

## 2021-04-14 NOTE — Progress Notes (Signed)
Medical screening examination/treatment/procedure(s) were performed by non-physician practitioner and as supervising physician I was immediately available for consultation/collaboration. I agree with above. Vadie Principato, MD   

## 2021-04-14 NOTE — Patient Instructions (Signed)
Pre visit review using our clinic review tool, if applicable. No additional management support is needed unless otherwise documented below in the visit note.  Continue to take 1 tablet daily .  Re-check in 4 weeks.  Add in some green vegetables (2 to 3 servings per week.).

## 2021-04-28 ENCOUNTER — Other Ambulatory Visit: Payer: Self-pay | Admitting: Internal Medicine

## 2021-04-28 DIAGNOSIS — Z7901 Long term (current) use of anticoagulants: Secondary | ICD-10-CM

## 2021-05-01 MED ORDER — WARFARIN SODIUM 10 MG PO TABS: ORAL_TABLET | ORAL | 1 refills | Status: DC

## 2021-05-01 NOTE — Telephone Encounter (Signed)
Pt compliant with coumadin management. Sent in refill. 

## 2021-05-10 ENCOUNTER — Ambulatory Visit (HOSPITAL_COMMUNITY)
Admission: EM | Admit: 2021-05-10 | Discharge: 2021-05-10 | Disposition: A | Discharge status: Home / Self Care | Payer: HMO | Attending: Student | Admitting: Student

## 2021-05-10 ENCOUNTER — Other Ambulatory Visit: Payer: Self-pay

## 2021-05-10 ENCOUNTER — Encounter (HOSPITAL_COMMUNITY): Payer: Self-pay | Admitting: Emergency Medicine

## 2021-05-10 DIAGNOSIS — H1132 Conjunctival hemorrhage, left eye: Secondary | ICD-10-CM

## 2021-05-10 DIAGNOSIS — H11152 Pinguecula, left eye: Secondary | ICD-10-CM | POA: Diagnosis not present

## 2021-05-10 MED ORDER — ERYTHROMYCIN 5 MG/GM OP OINT: TOPICAL_OINTMENT | OPHTHALMIC | 0 refills | Status: AC

## 2021-05-10 MED ORDER — TETRACAINE HCL 0.5 % OP SOLN
OPHTHALMIC | Status: AC
  Filled 2021-05-10: qty 4

## 2021-05-10 MED ORDER — FLUORESCEIN SODIUM 1 MG OP STRP
ORAL_STRIP | OPHTHALMIC | Status: AC
  Filled 2021-05-10: qty 1

## 2021-05-10 NOTE — ED Provider Notes (Signed)
[name]    CSN: [id number] Arrival date & time: 05/10/21  1751      History   Chief Complaint Chief Complaint  Patient presents with  . Eye Problem    Left    HPI [name] is a 65 y.o. male presenting with L eye issue x1 hour.  Medical history noncontributory.  He wears glasses but not contacts.  He did forgot his glasses today.  States he noticed some redness on the medial aspect of his left thigh about 1 hour ago.  When he was observing [name] also noticed a bump over the area.  This concerned him and so he presented straight to the urgent care.  He is feeling well otherwise, denies eye pain, eye pain with movement, eye crusting in the morning, vision changes, blurred vision, flashes of light in field of vision, curtains of light in field of vision.  Denies trauma.  He does spend a lot of time outside.  Denies photophobia, foreign body sensation  eye crusting in the morning, eye pain, eye pain with movement, injury to eye, vision changes, double vision, excessive tearing, burning eyes   HPI  Past Medical History:  Diagnosis Date  . Arthritis   . BACK PAIN   . CAD (coronary artery disease) 06/28/2017  . CAD in native artery    a. moderate by cardiac CT 2016.  Marland Kitchen Chronic bilateral low back pain with bilateral sciatica 01/12/2019  . Degenerative arthritis of right knee 07/15/2016   Injected in 07/15/2016 Orthovisc injection started 10/07/2016 DepoMedrol 40 inj on 04/11/17, 25 cc aspirated Orthovisc injection in 05/30/2017    . DEGENERATIVE JOINT DISEASE, KNEES, BILATERAL   . Depression   . Diabetes ([place]) 10/01/2014  . Diabetes mellitus    type II  . DIVERTICULOSIS, COLON   . DVT, lower extremity ([place])    right  . History of kidney stones   . Hyperlipidemia   . Hypertension   . HYPOGONADISM, MALE   . HYPOTENSION, ORTHOSTATIC   . Kidney stones   . Long term (current) use of anticoagulants 08/17/2017  . Long term current use of anticoagulant   . Lumbosacral  radiculopathy at S1 06/14/2012  . Maxillary sinus cyst 12/11/2018  . Obesity   . OBESITY, MORBID 03/04/2008   Qualifier: Diagnosis of  By: Jenny Reichmann MD, Hunt Oris   . OSA (obstructive sleep apnea) 09/11/2013  . Pulmonary emboli ([place])   . Pulmonary embolism ([place]) 02/05/2011  . RBBB 07/28/2017  . Renal insufficiency    a. prior h/o, does not appear chronic.  . Superficial phlebitis of arm 03/18/2016  . Vitamin D deficiency 07/09/2020    Patient Active Problem List   Diagnosis Date Noted  . CKD (chronic kidney disease) stage 3, GFR 30-59 ml/min (HCC) 03/14/2021  . Elevated PSA 03/13/2021  . B12 deficiency 03/13/2021  . Acute blood loss anemia 12/14/2020  . Osteoarthritis of right knee 11/18/2020  . S/P right TKA 11/18/2020  . Gross hematuria 09/28/2020  . Left flank pain 09/23/2020  . Dyspepsia 07/09/2020  . Left groin pain 07/09/2020  . Vitamin D deficiency 07/09/2020  . Chronic bilateral low back pain with bilateral sciatica 01/12/2019  . Chronic low back pain 01/12/2019  . Headache 12/11/2018  . AKI (acute kidney injury) ([place]) 12/11/2018  . Nosebleed 12/11/2018  . Maxillary sinus cyst 12/11/2018  . Left leg pain 12/11/2018  . Acute tubular injury of transplanted kidney ([place]) 12/11/2018  . Lower limb pain, inferior, left 12/11/2018  .  Left hip pain 12/01/2018  . Hip pain 12/01/2018  . Acute upper respiratory infection 08/17/2018  . Increased prostate specific antigen (PSA) velocity 07/12/2018  . Pain in lower jaw 03/23/2018  . Pain of left heel 12/21/2017  . RBBB 07/28/2017  . CAD (coronary artery disease) 06/28/2017  . Coronary arteriosclerosis 06/28/2017  . Degenerative arthritis of right knee 07/15/2016  . Bursitis of shoulder 07/15/2016  . Right shoulder pain 06/24/2016  . Right knee pain 06/24/2016  . Superficial phlebitis of arm 03/18/2016  . Abdominal pain 12/23/2015  . Rash 10/29/2015  . Eruption cyst 10/29/2015  . Chest pain at rest 03/27/2015  . Dysuria 03/27/2015  .  Diabetes ([place]) 10/01/2014  . S/P gastric bypass 08/07/2014  . Supratherapeutic INR 08/07/2014  . No blood products 07/17/2014  . Nipple pain 03/13/2014  . Breast mass in male 03/13/2014  . Pain of breast 03/13/2014  . Breast lump 03/13/2014  . Encounter for therapeutic drug monitoring 01/25/2014  . Esophageal reflux 12/19/2013  . Diabetes mellitus type 2 in obese ([place]) 12/19/2013  . Peripheral edema 10/10/2013  . OSA (obstructive sleep apnea) 09/11/2013  . Lesion of penis 01/01/2013  . Lumbosacral radiculopathy at S1 06/14/2012  . S/P left knee replacement 01/10/2012  . Preventative health care 12/16/2011  . Back pain with radiation 09/18/2011  . Backache with radiation 09/18/2011  . Long term current use of anticoagulant 04/08/2011  . Pulmonary embolism ([place]) 02/05/2011  . HYPOTENSION, ORTHOSTATIC 01/29/2010  . CONSTIPATION 01/21/2010  . DEGENERATIVE JOINT DISEASE, KNEES, BILATERAL 11/17/2009  . SMOKER 09/12/2009  . HYPOGONADISM, MALE 03/04/2008  . OBESITY, MORBID 03/04/2008  . Depression 10/05/2007  . DIVERTICULOSIS, COLON 10/05/2007  . RENAL INSUFFICIENCY 10/05/2007  . COLONIC POLYPS, HX OF 10/05/2007  . Hyperlipidemia 06/30/2007  . Essential hypertension 06/30/2007  . History of pulmonary embolus ([place]) 06/13/2006    Past Surgical History:  Procedure Laterality Date  . ankle surgury  2001 bilateral   with bone spurs and torn tendon  . COLONOSCOPY  03/18/2010  . EDG  2008   chronic Duodenitis  . GASTRIC BYPASS    . HEEL SPUR SURGERY Bilateral 2005  . knee surgury  2000 & 2003    cartilage damage  . LEFT HEART CATH AND CORONARY ANGIOGRAPHY N/A 02/16/2021   Procedure: LEFT HEART CATH AND CORONARY ANGIOGRAPHY;  Surgeon: Sherren Mocha, MD;  Location: [facility];  Service: Cardiovascular;  Laterality: N/A;  . LUMBAR LAMINECTOMY/DECOMPRESSION MICRODISCECTOMY  09/28/2012   Procedure: LUMBAR LAMINECTOMY/DECOMPRESSION MICRODISCECTOMY 2 LEVELS;  Surgeon: [name], MD;  Location: [facility];  Service: Orthopedics;  Laterality: Left;  Decompressive laminectomy L4-L5. Microdiscectomy L5-S1  . Skin removal surgery     extra abdominal skin removed from his weight loss  . TONSILLECTOMY AND ADENOIDECTOMY  age 63 or 55  . TOTAL KNEE ARTHROPLASTY  01/10/2012   Procedure: TOTAL KNEE ARTHROPLASTY;  Surgeon: [name], MD;  Location: [facility];  Service: Orthopedics;  Laterality: Left;  . TOTAL KNEE ARTHROPLASTY Right 11/18/2020   Procedure: TOTAL KNEE ARTHROPLASTY;  Surgeon: Paralee Cancel, MD;  Location: [facility];  Service: Orthopedics;  Laterality: Right;  70 mins  . UPPER GASTROINTESTINAL ENDOSCOPY  03/18/2010       Home Medications    Prior to Admission medications   Medication Sig Start Date End Date Taking? Authorizing Provider  erythromycin ophthalmic ointment Place a 1/2 inch ribbon of ointment into the lower eyelid x7 days 05/10/21 05/17/21 Yes Hazel Sams, PA-C  Artificial Tear Ointment (ARTIFICIAL TEARS) ointment Place 1 drop into both eyes every 4 (four) hours as needed (dry eyes).     [provider]  atorvastatin (LIPITOR) 80 MG tablet Take 1 tablet (80 mg total) by mouth daily. 07/30/20   Biagio Borg, MD  chlorthalidone (HYGROTON) 25 MG tablet TAKE 1 TABLET BY MOUTH EVERY DAY 04/28/21   Biagio Borg, MD  Cholecalciferol (VITAMIN D3) 125 MCG (5000 UT) CAPS Take 5,000 Units by mouth daily.     [provider]  Cyanocobalamin (VITAMIN B-12) 5000 MCG SUBL Place 5,000 mcg under the tongue daily.    [provider]  enoxaparin (LOVENOX) 120 MG/0.8ML injection INJECT 0.8 MLS (120 MG TOTAL) INTO THE SKIN EVERY 12 (TWELVE) HOURS FOR 13 DOSES. 02/13/21 02/20/21  Biagio Borg, MD  escitalopram (LEXAPRO) 10 MG tablet TAKE 1 TABLET BY MOUTH EVERY DAY 02/20/21   Biagio Borg, MD  ferrous sulfate 325 (65 FE) MG tablet Take 325 mg by mouth daily with breakfast.    [provider]  fluticasone (FLONASE) 50 MCG/ACT nasal  spray Place 2 sprays into both nostrils daily as needed for allergies. 11/28/18   Rama, Venetia Maxon, MD  glipiZIDE (GLUCOTROL XL) 2.5 MG 24 hr tablet TAKE 1 TABLET (2.5 MG TOTAL) BY MOUTH DAILY WITH BREAKFAST. 04/28/21   Biagio Borg, MD  metFORMIN (GLUCOPHAGE) 500 MG tablet Take 4 tablets (2,000 mg total) by mouth daily with breakfast. Patient taking differently: Take 1,000 mg by mouth daily with breakfast. 01/18/20   Biagio Borg, MD  Multiple Vitamin (MULTIVITAMIN WITH MINERALS) TABS tablet Take 1 tablet by mouth daily at 12 noon.    [provider]  nitroGLYCERIN (NITROSTAT) 0.4 MG SL tablet Place 1 tablet (0.4 mg total) under the tongue every 5 (five) minutes as needed for chest pain. 07/30/15   Dorothy Spark, MD  pantoprazole (PROTONIX) 40 MG tablet Take 1 tablet (40 mg total) by mouth daily. 07/09/20   Biagio Borg, MD  warfarin (COUMADIN) 10 MG tablet Take 1 tablet daily except take 1 1/2 tablets on Wed or Take as directed by anticoagulation clinic 05/01/21   Biagio Borg, MD    Family History Family History  Problem Relation Age of Onset  . Hypertension Mother   . Prostate cancer Father   . Diabetes Sister        died with DM 03-18-01, 2 more sisters with diabetes  . Heart failure Sister   . Diabetes Brother   . Diabetes Sister   . Diabetes Sister   . Colon cancer Neg Hx   . Colon polyps Neg Hx   . Esophageal cancer Neg Hx   . Rectal cancer Neg Hx   . Stomach cancer Neg Hx     Social History Social History   Tobacco Use  . Smoking status: Former Smoker    Packs/day: 0.25    Years: 5.00    Pack years: 1.25    Quit date: 12/06/1978    Years since quitting: 42.4  . Smokeless tobacco: Never Used  Vaping Use  . Vaping Use: Never used  Substance Use Topics  . Alcohol use: Yes    Alcohol/week: 2.0 standard drinks    Types: 2 Standard drinks or equivalent per week  . Drug use: No     Allergies   Adhesive [tape] and Other   Review of Systems Review of  Systems  Eyes: Positive for redness. Negative for photophobia, pain,  discharge, itching and visual disturbance.  All other systems reviewed and are negative.    Physical Exam Triage Vital Signs ED Triage Vitals  Enc Vitals Group     BP 05/10/21 1808 124/74     Pulse Rate 05/10/21 1808 70     Resp 05/10/21 1808 16     Temp 05/10/21 1808 98.7 F (37.1 C)     Temp Source 05/10/21 1808 Oral     SpO2 05/10/21 1808 97 %     Weight --      Height --      Head Circumference --      Peak Flow --      Pain Score 05/10/21 1806 0     Pain Loc --      Pain Edu? --      Excl. in [place]? --    No data found.  Updated Vital Signs BP 124/74 (BP Location: Right Arm)   Pulse 70   Temp 98.7 F (37.1 C) (Oral)   Resp 16   SpO2 97%   Visual Acuity Right Eye Distance: 20/40 Left Eye Distance: 20/30 Bilateral Distance: 20/30  Right Eye Near:   Left Eye Near:    Bilateral Near:     Physical Exam Vitals reviewed.  Constitutional:      Appearance: Normal appearance.  HENT:     Head: Normocephalic and atraumatic.     Right Ear: Tympanic membrane, ear canal and external ear normal. There is no impacted cerumen.     Left Ear: Tympanic membrane, ear canal and external ear normal. There is no impacted cerumen.     Nose: Nose normal. No congestion.     Mouth/Throat:     Pharynx: Oropharynx is clear. No posterior oropharyngeal erythema.  Eyes:     General: Lids are normal. Lids are everted, no foreign bodies appreciated. Vision grossly intact. Gaze aligned appropriately. No visual field deficit.       Right eye: No foreign body, discharge or hordeolum.        Left eye: No foreign body, discharge or hordeolum.     Extraocular Movements: Extraocular movements intact.     Right eye: Normal extraocular motion and no nystagmus.     Left eye: Normal extraocular motion and no nystagmus.     Conjunctiva/sclera:     Right eye: Right conjunctiva is not injected. No chemosis, exudate or hemorrhage.     Left eye: Left conjunctiva is not injected. Hemorrhage present. No chemosis or exudate.    Pupils: Pupils are equal, round, and reactive to light.     Visual Fields: Right eye visual fields normal and left eye visual fields normal.     Comments: PERRLA, EOMI without pain. No orbital tenderness.  Left eye with subconjunctival hemorrhage to medial aspect.  There is also a pinguecula present overlying this.  Visual acuity intact.   Cardiovascular:     Rate and Rhythm: Normal rate and regular rhythm.     Heart sounds: Normal heart sounds.  Pulmonary:     Effort: Pulmonary effort is normal.     Breath sounds: Normal breath sounds.  Neurological:     General: No focal deficit present.     Mental Status: He is alert.  Psychiatric:        Mood and Affect: Mood normal.        Behavior: Behavior normal.        Thought Content: Thought content normal.        Judgment:  Judgment normal.      UC Treatments / Results  Labs (all labs ordered are listed, but only abnormal results are displayed) Labs Reviewed - No data to display  EKG   Radiology No results found.  Procedures Procedures (including critical care time)  Medications Ordered in UC Medications - No data to display  Initial Impression / Assessment and Plan / UC Course  I have reviewed the triage vital signs and the nursing notes.  Pertinent labs & imaging results that were available during my care of the patient were reviewed by me and considered in my medical decision making (see chart for details).     This patient is a 65 year old male presenting with subconjunctival hemorrhage and concurrent pinguecula. He does spend a lot of time outside. Denies trauma.  Suspect that the pinguecula was present and he only noticed due to the new hemorrhage.  Denies recent straining, cough. Visual acuity intact. wears glasses, not contacts. Forgot his glasses today.  He already has a follow-up with his ophthalmologist scheduled for  tomorrow.  Reassurance provided.  Will cover with erythromycin ointment.  Strict ED return precautions discussed.  Final Clinical Impressions(s) / UC Diagnoses   Final diagnoses:  Subconjunctival hemorrhage of left eye  Pinguecula of left eye     Discharge Instructions     -Erythromycin ointment -Follow-up with ophthalmology at their earliest convenience. Information below. -Seek additional immediate medical attention if you develop new symptoms like severe eye pain, vision changes, eye pain with movement, vision loss, flashes of light in your field of vision. Head to the ED with these symptoms.     ED Prescriptions    Medication Sig Dispense Auth. Provider   erythromycin ophthalmic ointment Place a 1/2 inch ribbon of ointment into the lower eyelid x7 days 3.5 g Hazel Sams, PA-C     PDMP not reviewed this encounter.   Hazel Sams, PA-C 05/10/21 [phone]

## 2021-05-10 NOTE — ED Triage Notes (Signed)
Pt presents with left eye pain. States started approx 1 hour ago. Has redness and "bump" inside the red. Denies any visual changes.

## 2021-05-10 NOTE — Discharge Instructions (Addendum)
-  Erythromycin ointment -Follow-up with ophthalmology at their earliest convenience. Information below. -Seek additional immediate medical attention if you develop new symptoms like severe eye pain, vision changes, eye pain with movement, vision loss, flashes of light in your field of vision. Head to the ED with these symptoms.

## 2021-05-11 ENCOUNTER — Encounter (INDEPENDENT_AMBULATORY_CARE_PROVIDER_SITE_OTHER): Payer: HMO | Admitting: Ophthalmology

## 2021-05-11 ENCOUNTER — Telehealth: Payer: Self-pay | Admitting: Internal Medicine

## 2021-05-11 DIAGNOSIS — H43813 Vitreous degeneration, bilateral: Secondary | ICD-10-CM

## 2021-05-11 DIAGNOSIS — H33301 Unspecified retinal break, right eye: Secondary | ICD-10-CM | POA: Diagnosis not present

## 2021-05-11 DIAGNOSIS — I10 Essential (primary) hypertension: Secondary | ICD-10-CM | POA: Diagnosis not present

## 2021-05-11 DIAGNOSIS — H35033 Hypertensive retinopathy, bilateral: Secondary | ICD-10-CM

## 2021-05-11 LAB — HM DIABETES EYE EXAM

## 2021-05-11 NOTE — Telephone Encounter (Signed)
[facility] FYI 6.5.22:  --Caller states his eye felt like he had a hair in it. It is irritating him, and it wouldn't come out. He went to the bathroom and he has a bump next to hear duct. He does not know what it is. It is red and it is making his eye hurt some, and he is getting a headache on his left side. Eye is very red. No fever  Advised to see PCP within 4 hours.

## 2021-05-12 ENCOUNTER — Ambulatory Visit (INDEPENDENT_AMBULATORY_CARE_PROVIDER_SITE_OTHER): Payer: HMO | Admitting: General Practice

## 2021-05-12 ENCOUNTER — Other Ambulatory Visit: Payer: Self-pay

## 2021-05-12 DIAGNOSIS — Z7901 Long term (current) use of anticoagulants: Secondary | ICD-10-CM | POA: Diagnosis not present

## 2021-05-12 LAB — POCT INR: INR: 2.7 (ref 2.0–3.0)

## 2021-05-12 NOTE — Progress Notes (Signed)
Medical screening examination/treatment/procedure(s) were performed by non-physician practitioner and as supervising physician I was immediately available for consultation/collaboration. I agree with above. Ajani Rineer, MD   

## 2021-05-12 NOTE — Patient Instructions (Addendum)
Pre visit review using our clinic review tool, if applicable. No additional management support is needed unless otherwise documented below in the visit note.  Continue to take 1 tablet daily .  Re-check in 6 weeks.  Add in some green vegetables (2 to 3 servings per week.).

## 2021-05-27 DIAGNOSIS — R972 Elevated prostate specific antigen [PSA]: Secondary | ICD-10-CM | POA: Diagnosis not present

## 2021-06-02 ENCOUNTER — Other Ambulatory Visit: Payer: Self-pay | Admitting: Internal Medicine

## 2021-06-02 NOTE — Telephone Encounter (Signed)
Please refill as per office routine med refill policy (all routine meds refilled for 3 mo or monthly per pt preference up to one year from last visit, then month to month grace period for 3 mo, then further med refills will have to be denied)  

## 2021-06-09 DIAGNOSIS — R350 Frequency of micturition: Secondary | ICD-10-CM | POA: Diagnosis not present

## 2021-06-09 DIAGNOSIS — R972 Elevated prostate specific antigen [PSA]: Secondary | ICD-10-CM | POA: Diagnosis not present

## 2021-06-12 ENCOUNTER — Telehealth: Payer: Self-pay | Admitting: Pharmacist

## 2021-06-12 MED ORDER — ATORVASTATIN CALCIUM 80 MG PO TABS: 80.0000 mg | ORAL_TABLET | Freq: Every day | ORAL | 2 refills | Status: DC

## 2021-06-12 NOTE — Telephone Encounter (Signed)
Patient is requesting refill for atorvastatin (LIPITOR) 80 MG tablet   Preferred pharmacy:  [id number] - [place], [place] [place] [address] Phone: [phone] Fax: [phone]

## 2021-06-23 ENCOUNTER — Ambulatory Visit (INDEPENDENT_AMBULATORY_CARE_PROVIDER_SITE_OTHER): Payer: HMO | Admitting: General Practice

## 2021-06-23 ENCOUNTER — Other Ambulatory Visit: Payer: Self-pay

## 2021-06-23 DIAGNOSIS — Z7901 Long term (current) use of anticoagulants: Secondary | ICD-10-CM

## 2021-06-23 LAB — POCT INR: INR: 2.9 (ref 2.0–3.0)

## 2021-06-23 NOTE — Patient Instructions (Addendum)
Pre visit review using our clinic review tool, if applicable. No additional management support is needed unless otherwise documented below in the visit note.  Continue to take 1 tablet daily .  Re-check in 6 weeks.  Add in some green vegetables (2 to 3 servings per week.).

## 2021-06-23 NOTE — Progress Notes (Signed)
Medical screening examination/treatment/procedure(s) were performed by non-physician practitioner and as supervising physician I was immediately available for consultation/collaboration. I agree with above. Tegan Burnside, MD   

## 2021-08-04 ENCOUNTER — Ambulatory Visit (INDEPENDENT_AMBULATORY_CARE_PROVIDER_SITE_OTHER): Payer: HMO

## 2021-08-04 ENCOUNTER — Other Ambulatory Visit: Payer: Self-pay

## 2021-08-04 DIAGNOSIS — Z7901 Long term (current) use of anticoagulants: Secondary | ICD-10-CM | POA: Diagnosis not present

## 2021-08-04 LAB — POCT INR: INR: 2.9 (ref 2.0–3.0)

## 2021-08-04 NOTE — Progress Notes (Signed)
Medical screening examination/treatment/procedure(s) were performed by non-physician practitioner and as supervising physician I was immediately available for consultation/collaboration. I agree with above. Tyresse Jayson, MD   

## 2021-08-04 NOTE — Patient Instructions (Addendum)
Pre visit review using our clinic review tool, if applicable. No additional management support is needed unless otherwise documented below in the visit note.  Continue to take 1 tablet daily .  Re-check in 6 weeks.  Add in some green vegetables (2 to 3 servings per week.).

## 2021-08-06 ENCOUNTER — Other Ambulatory Visit: Payer: Self-pay

## 2021-08-06 ENCOUNTER — Telehealth (INDEPENDENT_AMBULATORY_CARE_PROVIDER_SITE_OTHER): Payer: HMO | Admitting: Family Medicine

## 2021-08-06 ENCOUNTER — Other Ambulatory Visit: Payer: Self-pay | Admitting: Internal Medicine

## 2021-08-06 ENCOUNTER — Encounter: Payer: Self-pay | Admitting: Family Medicine

## 2021-08-06 VITALS — Temp 98.6°F

## 2021-08-06 DIAGNOSIS — Z20822 Contact with and (suspected) exposure to covid-19: Secondary | ICD-10-CM | POA: Diagnosis not present

## 2021-08-06 DIAGNOSIS — Z7901 Long term (current) use of anticoagulants: Secondary | ICD-10-CM

## 2021-08-06 DIAGNOSIS — U071 COVID-19: Secondary | ICD-10-CM

## 2021-08-06 MED ORDER — MOLNUPIRAVIR EUA 200MG CAPSULE: 4.0000 | ORAL_CAPSULE | Freq: Two times a day (BID) | ORAL | 0 refills | Status: AC

## 2021-08-06 MED ORDER — BENZONATATE 100 MG PO CAPS: 100.0000 mg | ORAL_CAPSULE | Freq: Three times a day (TID) | ORAL | 0 refills | Status: DC | PRN

## 2021-08-06 NOTE — Patient Instructions (Addendum)
HOME CARE TIPS:  -I sent the medication(s) we discussed to your pharmacy: Meds ordered this encounter  Medications   molnupiravir EUA 200 mg CAPS    Sig: Take 4 capsules (800 mg total) by mouth 2 (two) times daily for 5 days.    Dispense:  40 capsule    Refill:  0   benzonatate (TESSALON PERLES) 100 MG capsule    Sig: Take 1 capsule (100 mg total) by mouth 3 (three) times daily as needed.    Dispense:  20 capsule    Refill:  0     -I sent in the [facility] treatment or referral you requested per our discussion. Please see the information provided below and discuss further with the pharmacist/treatment team.   -If taking an antiviral, there is a chance of rebound illness after finishing your treatment. If you become sick again please isolate for an additional 5 days.    -can use tylenol if needed for fevers, aches and pains per instructions  -can use nasal saline a few times per day if you have nasal congestion  -stay hydrated, drink plenty of fluids and eat small healthy meals - avoid dairy   -follow up with your doctor in 2-3 days unless improving and feeling better  -stay home while sick, except to seek medical care. If you have COVID19, ideally it would be best to stay home for a full 10 days since the onset of symptoms PLUS one day of no fever and feeling better. Wear a good mask that fits snugly (such as N95 or KN95) if around others to reduce the risk of transmission.  It was nice to meet you today, and I really hope you are feeling better soon. I help [facility] out with telemedicine visits on Tuesdays and Thursdays and am available for visits on those days. If you have any concerns or questions following this visit please schedule a follow up visit with your Primary Care doctor or seek care at a local urgent care clinic to avoid delays in care.    Seek in person care or schedule a follow up video visit promptly if your symptoms worsen, new concerns arise or you are not  improving with treatment. Call 911 and/or seek emergency care if your symptoms are severe or life threatening.    Fact Sheet for Patients And Caregivers Emergency Use Authorization (EUA) Of LAGEVRIOT (molnupiravir) capsules For Coronavirus Disease 2019 (COVID-19)  What is the most important information I should know about LAGEVRIO? LAGEVRIO may cause serious side effects, including: ? LAGEVRIO may cause harm to your unborn baby. It is not known if LAGEVRIO will harm your baby if you take LAGEVRIO during pregnancy. o LAGEVRIO is not recommended for use in pregnancy. o LAGEVRIO has not been studied in pregnancy. LAGEVRIO was studied in pregnant animals only. When LAGEVRIO was given to pregnant animals, LAGEVRIO caused harm to their unborn babies. o You and your healthcare provider may decide that you should take LAGEVRIO during pregnancy if there are no other COVID-19 treatment options approved or authorized by the FDA that are accessible or clinically appropriate for you. o If you and your healthcare provider decide that you should take LAGEVRIO during pregnancy, you and your healthcare provider should discuss the known and potential benefits and the potential risks of taking LAGEVRIO during pregnancy. For individuals who are able to become pregnant: ? You should use a reliable method of birth control (contraception) consistently and correctly during treatment with LAGEVRIO and for 4 days  after the last dose of LAGEVRIO. Talk to your healthcare provider about reliable birth control methods. ? Before starting treatment with [facility] your healthcare provider may do a pregnancy test to see if you are pregnant before starting treatment with LAGEVRIO. ? Tell your healthcare provider right away if you become pregnant or think you may be pregnant during treatment with LAGEVRIO. Pregnancy Surveillance Program: ? There is a pregnancy surveillance program for individuals who take LAGEVRIO  during pregnancy. The purpose of this program is to collect information about the health of you and your baby. Talk to your healthcare provider about how to take part in this program. ? If you take LAGEVRIO during pregnancy and you agree to participate in the pregnancy surveillance program and allow your healthcare provider to share your information with [place], then your healthcare provider will report your use of [place] during pregnancy to [email or web address]. by calling [phone] or [email or web address]. For individuals who are sexually active with partners who are able to become pregnant: ? It is not known if LAGEVRIO can affect sperm. While the risk is regarded as low, animal studies to fully assess the potential for LAGEVRIO to affect the babies of males treated with LAGEVRIO have not been completed. A reliable method of birth control (contraception) should be used consistently and correctly during treatment with LAGEVRIO and for at least 3 months after the last dose. The risk to sperm beyond 3 months is not known. Studies to understand the risk to sperm beyond 3 months are ongoing. Talk to your healthcare provider about reliable birth control methods. Talk to your healthcare provider if you have questions or concerns about how LAGEVRIO may affect sperm. You are being given this fact sheet because your healthcare provider believes it is necessary to provide you with LAGEVRIO for the treatment of adults with mild-to-moderate coronavirus disease 2019 (COVID-19) with positive results of direct SARS-CoV-2 viral testing, and who are at high risk for progression to severe COVID-19 including hospitalization or death, and for whom other COVID-19 treatment options approved or authorized by the FDA are not accessible or clinically appropriate. The U.S. Food and Drug Administration (FDA) has issued an Emergency Use Authorization (EUA) to make LAGEVRIO available  during the COVID-19 pandemic (for more details about an EUA please see "What is an Emergency Use Authorization?" at the end of this document). LAGEVRIO is not an FDA-approved medicine in the [place]. Read this Fact Sheet for information about LAGEVRIO. Talk to your healthcare provider about your options if you have any questions. It is your choice to take LAGEVRIO.  What is COVID-19? COVID-19 is caused by a virus called a coronavirus. You can get COVID-19 through close contact with another person who has the virus. COVID-19 illnesses have ranged from very mild-to-severe, including illness resulting in death. While information so far suggests that most COVID-19 illness is mild, serious illness can happen and may cause some of your other medical conditions to become worse. Older people and people of all ages with severe, long lasting (chronic) medical conditions like heart disease, lung disease and diabetes, for example seem to be at higher risk of being hospitalized for COVID-19.  What is LAGEVRIO? LAGEVRIO is an investigational medicine used to treat mild-to-moderate COVID-19 in adults: ? with positive results of direct SARS-CoV-2 viral testing, and ? who are at high risk for progression to severe COVID-19 including hospitalization or death, and for whom other COVID-19 treatment options approved or authorized by the  FDA are not accessible or clinically appropriate. The FDA has authorized the emergency use of LAGEVRIO for the treatment of mild-tomoderate COVID-19 in adults under an EUA. For more information on EUA, see the "What is an Emergency Use Authorization (EUA)?" section at the end of this Fact Sheet. LAGEVRIO is not authorized: ? for use in people less than 40 years of age. ? for prevention of COVID-19. ? for people needing hospitalization for COVID-19. ? for use for longer than 5 consecutive days.  What should I tell my healthcare provider before I take LAGEVRIO? Tell  your healthcare provider if you: ? Have any allergies ? Are breastfeeding or plan to breastfeed ? Have any serious illnesses ? Are taking any medicines (prescription, over-the-counter, vitamins, or herbal products).  How do I take LAGEVRIO? ? Take LAGEVRIO exactly as your healthcare provider tells you to take it. ? Take 4 capsules of LAGEVRIO every 12 hours (for example, at 8 am and at 8 pm) ? Take LAGEVRIO for 5 days. It is important that you complete the full 5 days of treatment with LAGEVRIO. Do not stop taking LAGEVRIO before you complete the full 5 days of treatment, even if you feel better. ? Take LAGEVRIO with or without food. ? You should stay in isolation for as long as your healthcare provider tells you to. Talk to your healthcare provider if you are not sure about how to properly isolate while you have COVID-19. ? Swallow LAGEVRIO capsules whole. Do not open, break, or crush the capsules. If you cannot swallow capsules whole, tell your healthcare provider. ? What to do if you miss a dose: o If it has been less than 10 hours since the missed dose, take it as soon as you remember o If it has been more than 10 hours since the missed dose, skip the missed dose and take your dose at the next scheduled time. ? Do not double the dose of LAGEVRIO to make up for a missed dose.  What are the important possible side effects of LAGEVRIO? ? See, "What is the most important information I should know about LAGEVRIO?" ? Allergic Reactions. Allergic reactions can happen in people taking LAGEVRIO, even after only 1 dose. Stop taking LAGEVRIO and call your healthcare provider right away if you get any of the following symptoms of an allergic reaction: o hives o rapid heartbeat o trouble swallowing or breathing o swelling of the mouth, lips, or face o throat tightness o hoarseness o skin rash The most common side effects of LAGEVRIO are: ? diarrhea ? nausea ? dizziness These are not  all the possible side effects of LAGEVRIO. Not many people have taken LAGEVRIO. Serious and unexpected side effects may happen. This medicine is still being studied, so it is possible that all of the risks are not known at this time.  What other treatment choices are there?  Veklury (remdesivir) is FDA-approved as an intravenous (IV) infusion for the treatment of mildto-moderate TKPTW-65 in certain adults and children. Talk with your doctor to see if [place] is appropriate for you. Like LAGEVRIO, FDA may also allow for the emergency use of other medicines to treat people with COVID-19. Go to LacrosseProperties.si for more information. It is your choice to be treated or not to be treated with LAGEVRIO. Should you decide not to take it, it will not change your standard medical care.  What if I am breastfeeding? Breastfeeding is not recommended during treatment with LAGEVRIO and for 4 days after  the last dose of LAGEVRIO. If you are breastfeeding or plan to breastfeed, talk to your healthcare provider about your options and specific situation before taking LAGEVRIO.  How do I report side effects with LAGEVRIO? Contact your healthcare provider if you have any side effects that bother you or do not go away. Report side effects to FDA MedWatch at SmoothHits.hu or call 1-800-FDA-1088 (1- 210-796-9279).  How should I store [place]? ? Store LAGEVRIO capsules at room temperature between 62F to 77F (20C to 25C). ? Keep LAGEVRIO and all medicines out of the reach of children and pets. How can I learn more about COVID-19? ? Ask your healthcare provider. ? Visit SeekRooms.co.uk ? Contact your local or state public health department. ? Call [place] at [phone] (toll free in the [place] ? Visit [email or web address].com  What Is an Emergency Use Authorization (EUA)? The  [place] FDA has made [place] available under an emergency access mechanism called an Emergency Use Authorization (EUA) The EUA is supported by a Presenter, broadcasting Health and Human Service (HHS) declaration that circumstances exist to justify emergency use of drugs and biological products during the COVID-19 pandemic. LAGEVRIO for the treatment of mild-to-moderate COVID-19 in adults with positive results of direct SARS-CoV-2 viral testing, who are at high risk for progression to severe COVID-19, including hospitalization or death, and for whom alternative COVID-19 treatment options approved or authorized by FDA are not accessible or clinically appropriate, has not undergone the same type of review as an FDA-approved product. In issuing an EUA under the ZDGUY-40 public health emergency, the FDA has determined, among other things, that based on the total amount of scientific evidence available including data from adequate and well-controlled clinical trials, if available, it is reasonable to believe that the product may be effective for diagnosing, treating, or preventing COVID-19, or a serious or life-threatening disease or condition caused by COVID-19; that the known and potential benefits of the product, when used to diagnose, treat, or prevent such disease or condition, outweigh the known and potential risks of such product; and that there are no adequate, approved, and available alternatives.  All of these criteria must be met to allow for the product to be used in the treatment of patients during the COVID-19 pandemic. The EUA for LAGEVRIO is in effect for the duration of the COVID-19 declaration justifying emergency use of LAGEVRIO, unless terminated or revoked (after which LAGEVRIO may no longer be used under the EUA). For patent information: http://rogers.info/ Copyright  2021-2022 [employer] [place], [place] and its affiliates. All rights  reserved. [id number] Revised: March 2022

## 2021-08-06 NOTE — Telephone Encounter (Signed)
[place] for refill lexapro - done erx  Please ask pt to call coumadin clinic for coumadin refills

## 2021-08-06 NOTE — Progress Notes (Signed)
Virtual Visit via Video Note  I connected with [name]  on 08/06/21 at  4:20 PM EDT by a video enabled telemedicine application and verified that I am speaking with the correct person using two identifiers.  Location patient: home, [place] Location provider:work or home office Persons participating in the virtual visit: patient, provider  I discussed the limitations of evaluation and management by telemedicine and the availability of in person appointments. The patient expressed understanding and agreed to proceed.   HPI:  Acute telemedicine visit for Covid19: -Onset: 2 days ago -Symptoms include: cough, sore throat, headache -Denies: fevers, CP, SOB, NVD, inability to eat/drink/get out of bed -Pertinent past medical history: see below -Pertinent medication allergies:  Allergies  Allergen Reactions   Adhesive [Tape] Other (See Comments)    Painful, Please use "paper" tape   Other     NO BLOOD -JEHOVAH'S WITNESS-SIGNED REFUSAL BUT WOULD TAKE ALBUMIN  -COVID-19 vaccine status: has had 2 doses and 2 boosters   ROS: See pertinent positives and negatives per HPI.  Past Medical History:  Diagnosis Date   Arthritis    BACK PAIN    CAD (coronary artery disease) 06/28/2017   CAD in native artery    a. moderate by cardiac CT 2016.   Chronic bilateral low back pain with bilateral sciatica 01/12/2019   Degenerative arthritis of right knee 07/15/2016   Injected in 07/15/2016 Orthovisc injection started 10/07/2016 DepoMedrol 40 inj on 04/11/17, 25 cc aspirated Orthovisc injection in 05/30/2017     DEGENERATIVE JOINT DISEASE, KNEES, BILATERAL    Depression    Diabetes ([place]) 10/01/2014   Diabetes mellitus    type II   DIVERTICULOSIS, COLON    DVT, lower extremity ([place])    right   History of kidney stones    Hyperlipidemia    Hypertension    HYPOGONADISM, MALE    HYPOTENSION, ORTHOSTATIC    Kidney stones    Long term (current) use of anticoagulants 08/17/2017   Long term current use of  anticoagulant    Lumbosacral radiculopathy at S1 06/14/2012   Maxillary sinus cyst 12/11/2018   Obesity    OBESITY, MORBID 03/04/2008   Qualifier: Diagnosis of  By: Jenny Reichmann MD, Hunt Oris    OSA (obstructive sleep apnea) 09/11/2013   Pulmonary emboli (HCC)    Pulmonary embolism ([place]) 02/05/2011   RBBB 07/28/2017   Renal insufficiency    a. prior h/o, does not appear chronic.   Superficial phlebitis of arm 03/18/2016   Vitamin D deficiency 07/09/2020    Past Surgical History:  Procedure Laterality Date   ankle surgury  2001 bilateral   with bone spurs and torn tendon   COLONOSCOPY  03/18/2010   EDG  2008   chronic Duodenitis   GASTRIC BYPASS     HEEL SPUR SURGERY Bilateral 2005   knee surgury  2000 & 2003    cartilage damage   LEFT HEART CATH AND CORONARY ANGIOGRAPHY N/A 02/16/2021   Procedure: LEFT HEART CATH AND CORONARY ANGIOGRAPHY;  Surgeon: Sherren Mocha, MD;  Location: [facility];  Service: Cardiovascular;  Laterality: N/A;   LUMBAR LAMINECTOMY/DECOMPRESSION MICRODISCECTOMY  09/28/2012   Procedure: LUMBAR LAMINECTOMY/DECOMPRESSION MICRODISCECTOMY 2 LEVELS;  Surgeon: [name], MD;  Location: [facility];  Service: Orthopedics;  Laterality: Left;  Decompressive laminectomy L4-L5. Microdiscectomy L5-S1   Skin removal surgery     extra abdominal skin removed from his weight loss   TONSILLECTOMY AND ADENOIDECTOMY  age 65 or 6   TOTAL KNEE  ARTHROPLASTY  01/10/2012   Procedure: TOTAL KNEE ARTHROPLASTY;  Surgeon: [name], MD;  Location: [facility];  Service: Orthopedics;  Laterality: Left;   TOTAL KNEE ARTHROPLASTY Right 11/18/2020   Procedure: TOTAL KNEE ARTHROPLASTY;  Surgeon: Paralee Cancel, MD;  Location: [facility];  Service: Orthopedics;  Laterality: Right;  70 mins   UPPER GASTROINTESTINAL ENDOSCOPY  03/18/2010     Current Outpatient Medications:    Artificial Tear Ointment (ARTIFICIAL TEARS) ointment, Place 1 drop into both eyes every 4 (four) hours as needed (dry eyes). ,  Disp: , Rfl:    atorvastatin (LIPITOR) 80 MG tablet, Take 1 tablet (80 mg total) by mouth daily., Disp: 90 tablet, Rfl: 2   benzonatate (TESSALON PERLES) 100 MG capsule, Take 1 capsule (100 mg total) by mouth 3 (three) times daily as needed., Disp: 20 capsule, Rfl: 0   chlorthalidone (HYGROTON) 25 MG tablet, TAKE 1 TABLET BY MOUTH EVERY DAY, Disp: 90 tablet, Rfl: 1   Cholecalciferol (VITAMIN D3) 125 MCG (5000 UT) CAPS, Take 5,000 Units by mouth daily. , Disp: , Rfl:    Cyanocobalamin (VITAMIN B-12) 5000 MCG SUBL, Place 5,000 mcg under the tongue 3 (three) times a week., Disp: , Rfl:    escitalopram (LEXAPRO) 10 MG tablet, TAKE 1 TABLET BY MOUTH EVERY DAY, Disp: 90 tablet, Rfl: 2   fluticasone (FLONASE) 50 MCG/ACT nasal spray, Place 2 sprays into both nostrils daily as needed for allergies., Disp: , Rfl:    glipiZIDE (GLUCOTROL XL) 2.5 MG 24 hr tablet, TAKE 1 TABLET (2.5 MG TOTAL) BY MOUTH DAILY WITH BREAKFAST., Disp: 90 tablet, Rfl: 3   metFORMIN (GLUCOPHAGE) 500 MG tablet, TAKE 4 TABLETS BY MOUTH DAILY WITH BREAKFAST, Disp: 360 tablet, Rfl: 3   molnupiravir EUA 200 mg CAPS, Take 4 capsules (800 mg total) by mouth 2 (two) times daily for 5 days., Disp: 40 capsule, Rfl: 0   Multiple Vitamin (MULTIVITAMIN WITH MINERALS) TABS tablet, Take 1 tablet by mouth daily at 12 noon., Disp: , Rfl:    nitroGLYCERIN (NITROSTAT) 0.4 MG SL tablet, Place 1 tablet (0.4 mg total) under the tongue every 5 (five) minutes as needed for chest pain., Disp: 90 tablet, Rfl: 3   pantoprazole (PROTONIX) 40 MG tablet, TAKE 1 TABLET BY MOUTH EVERY DAY, Disp: 90 tablet, Rfl: 3   warfarin (COUMADIN) 10 MG tablet, Take 1 tablet daily except take 1 1/2 tablets on Wed or Take as directed by anticoagulation clinic, Disp: 105 tablet, Rfl: 1  EXAM:  VITALS per patient if applicable:  GENERAL: alert, oriented, appears well and in no acute distress  HEENT: atraumatic, conjunttiva clear, no obvious abnormalities on inspection of  external nose and ears  NECK: normal movements of the head and neck  LUNGS: on inspection no signs of respiratory distress, breathing rate appears normal, no obvious gross SOB, gasping or wheezing  CV: no obvious cyanosis  MS: moves all visible extremities without noticeable abnormality  PSYCH/NEURO: pleasant and cooperative, no obvious depression or anxiety, speech and thought processing grossly intact  ASSESSMENT AND PLAN:  Discussed the following assessment and plan:  COVID-19   Discussed treatment options (infusions and oral options and risk of drug interactions), ideal treatment window, potential complications, isolation and precautions for COVID-19.  Discussed possibility of rebound with antivirals and the need to reisolate if it should occur for 5 days. Checked for/reviewed any labs done in the last 90 days with GFR listed in HPI if available. After lengthy discussion, the patient opted  for treatment with molnupiravir due to being higher risk for complications of covid or severe disease and other factors. He prefers to avoid potential interactions/medication changes w/ Paxlovid. Discussed EUA status of this drug and the fact that there is preliminary limited knowledge of risks/interactions/side effects per EUA document vs possible benefits and precautions. This information was shared with patient during the visit and also was provided in patient instructions. Also, advised that patient discuss risks/interactions and use with pharmacist/treatment team as well. The patient did want a prescription for cough, Tessalon Rx sent.  Other symptomatic care measures summarized in patient instructions.  Advised to seek prompt in person care if worsening, new symptoms arise, or if is not improving with treatment. Discussed options for inperson care if PCP office not available. Did let this patient know that I only do telemedicine on Tuesdays and Thursdays for [place]. Advised to schedule follow up  visit with PCP or [facility] if any further questions or concerns to avoid delays in care.   I discussed the assessment and treatment plan with the patient. The patient was provided an opportunity to ask questions and all were answered. The patient agreed with the plan and demonstrated an understanding of the instructions.     Lucretia Kern, DO

## 2021-08-19 DIAGNOSIS — U071 COVID-19: Secondary | ICD-10-CM | POA: Diagnosis not present

## 2021-08-19 DIAGNOSIS — Z20822 Contact with and (suspected) exposure to covid-19: Secondary | ICD-10-CM | POA: Diagnosis not present

## 2021-08-20 ENCOUNTER — Telehealth (INDEPENDENT_AMBULATORY_CARE_PROVIDER_SITE_OTHER): Payer: HMO | Admitting: Internal Medicine

## 2021-08-20 DIAGNOSIS — E559 Vitamin D deficiency, unspecified: Secondary | ICD-10-CM

## 2021-08-20 DIAGNOSIS — E1122 Type 2 diabetes mellitus with diabetic chronic kidney disease: Secondary | ICD-10-CM

## 2021-08-20 DIAGNOSIS — N183 Chronic kidney disease, stage 3 unspecified: Secondary | ICD-10-CM | POA: Diagnosis not present

## 2021-08-20 DIAGNOSIS — M5441 Lumbago with sciatica, right side: Secondary | ICD-10-CM

## 2021-08-20 DIAGNOSIS — U071 COVID-19: Secondary | ICD-10-CM | POA: Diagnosis not present

## 2021-08-20 DIAGNOSIS — M5417 Radiculopathy, lumbosacral region: Secondary | ICD-10-CM

## 2021-08-20 DIAGNOSIS — G8929 Other chronic pain: Secondary | ICD-10-CM | POA: Diagnosis not present

## 2021-08-20 MED ORDER — NIRMATRELVIR/RITONAVIR (PAXLOVID)TABLET: 3.0000 | ORAL_TABLET | Freq: Two times a day (BID) | ORAL | 0 refills | Status: AC

## 2021-08-20 MED ORDER — PREDNISONE 10 MG PO TABS: ORAL_TABLET | ORAL | 0 refills | Status: DC

## 2021-08-20 MED ORDER — HYDROCODONE BIT-HOMATROP MBR 5-1.5 MG/5ML PO SOLN: 5.0000 mL | Freq: Four times a day (QID) | ORAL | 0 refills | Status: AC | PRN

## 2021-08-26 ENCOUNTER — Encounter: Payer: Self-pay | Admitting: Internal Medicine

## 2021-08-26 DIAGNOSIS — U071 COVID-19: Secondary | ICD-10-CM | POA: Insufficient documentation

## 2021-08-26 NOTE — Assessment & Plan Note (Signed)
Last vitamin D Lab Results  Component Value Date   VD25OH 45.09 03/10/2021   Stable, cont oral replacement

## 2021-08-26 NOTE — Progress Notes (Signed)
Patient ID: [name], male   DOB: [birth year], 65 y.o.   MRN: [id number]  Virtual Visit via Video Note  I connected with [name] on sept 15 2022 at  1:20 PM EDT by a video enabled telemedicine application and verified that I am speaking with the correct person using two identifiers.  Location of all participants today Patient: at home Provider: at office   I discussed the limitations of evaluation and management by telemedicine and the availability of in person appointments. The patient expressed understanding and agreed to proceed.  History of Present Illness: Here to f/u with c/o first becoming covid + sept 1 , seen by virtual visit with cough, ST, HA only, tx with molnupiravir and seemed symptomatically improved for several days, but then began to feel symptoms return and now covid + again sept 14, with same symptoms.  Pt denies chest pain, increased sob or doe, wheezing, orthopnea, PND, increased LE swelling, palpitations, dizziness or syncope.  No n/v, diarrhea, abd pains.  Pt continues to have recurring LBP without change in severity, bowel or bladder change, fever, wt loss, gait change or falls, but does have mild worsening right sciatica in the past week.   Pt denies fever, wt loss, night sweats, loss of appetite, or other constitutional symptoms   Pt is taking Vit D Past Medical History:  Diagnosis Date   Arthritis    BACK PAIN    CAD (coronary artery disease) 06/28/2017   CAD in native artery    a. moderate by cardiac CT 2016.   Chronic bilateral low back pain with bilateral sciatica 01/12/2019   Degenerative arthritis of right knee 07/15/2016   Injected in 07/15/2016 Orthovisc injection started 10/07/2016 DepoMedrol 40 inj on 04/11/17, 25 cc aspirated Orthovisc injection in 05/30/2017     DEGENERATIVE JOINT DISEASE, KNEES, BILATERAL    Depression    Diabetes ([place]) 10/01/2014   Diabetes mellitus    type II   DIVERTICULOSIS, COLON    DVT, lower extremity ([place])    right    History of kidney stones    Hyperlipidemia    Hypertension    HYPOGONADISM, MALE    HYPOTENSION, ORTHOSTATIC    Kidney stones    Long term (current) use of anticoagulants 08/17/2017   Long term current use of anticoagulant    Lumbosacral radiculopathy at S1 06/14/2012   Maxillary sinus cyst 12/11/2018   Obesity    OBESITY, MORBID 03/04/2008   Qualifier: Diagnosis of  By: Jenny Reichmann MD, Hunt Oris    OSA (obstructive sleep apnea) 09/11/2013   Pulmonary emboli (HCC)    Pulmonary embolism ([place]) 02/05/2011   RBBB 07/28/2017   Renal insufficiency    a. prior h/o, does not appear chronic.   Superficial phlebitis of arm 03/18/2016   Vitamin D deficiency 07/09/2020   Past Surgical History:  Procedure Laterality Date   ankle surgury  2001 bilateral   with bone spurs and torn tendon   COLONOSCOPY  03/18/2010   EDG  2008   chronic Duodenitis   GASTRIC BYPASS     HEEL SPUR SURGERY Bilateral 2005   knee surgury  2000 & 2003    cartilage damage   LEFT HEART CATH AND CORONARY ANGIOGRAPHY N/A 02/16/2021   Procedure: LEFT HEART CATH AND CORONARY ANGIOGRAPHY;  Surgeon: Sherren Mocha, MD;  Location: [facility];  Service: Cardiovascular;  Laterality: N/A;   LUMBAR LAMINECTOMY/DECOMPRESSION MICRODISCECTOMY  09/28/2012   Procedure: LUMBAR LAMINECTOMY/DECOMPRESSION MICRODISCECTOMY 2 LEVELS;  Surgeon:  [name], MD;  Location: [facility];  Service: Orthopedics;  Laterality: Left;  Decompressive laminectomy L4-L5. Microdiscectomy L5-S1   Skin removal surgery     extra abdominal skin removed from his weight loss   TONSILLECTOMY AND ADENOIDECTOMY  age 55 or 6   TOTAL KNEE ARTHROPLASTY  01/10/2012   Procedure: TOTAL KNEE ARTHROPLASTY;  Surgeon: [name], MD;  Location: [facility];  Service: Orthopedics;  Laterality: Left;   TOTAL KNEE ARTHROPLASTY Right 11/18/2020   Procedure: TOTAL KNEE ARTHROPLASTY;  Surgeon: Paralee Cancel, MD;  Location: [facility];  Service: Orthopedics;  Laterality: Right;  70 mins    UPPER GASTROINTESTINAL ENDOSCOPY  03/18/2010    reports that he quit smoking about 42 years ago. His smoking use included cigarettes. He has a 1.25 pack-year smoking history. He has never used smokeless tobacco. He reports current alcohol use of about 2.0 standard drinks per week. He reports that he does not use drugs. family history includes Diabetes in his brother, sister, sister, and sister; Heart failure in his sister; Hypertension in his mother; Prostate cancer in his father. Allergies  Allergen Reactions   Adhesive [Tape] Other (See Comments)    Painful, Please use "paper" tape   Other     NO BLOOD -JEHOVAH'S WITNESS-SIGNED REFUSAL BUT WOULD TAKE ALBUMIN   Current Outpatient Medications on File Prior to Visit  Medication Sig Dispense Refill   Artificial Tear Ointment (ARTIFICIAL TEARS) ointment Place 1 drop into both eyes every 4 (four) hours as needed (dry eyes).      atorvastatin (LIPITOR) 80 MG tablet Take 1 tablet (80 mg total) by mouth daily. 90 tablet 2   benzonatate (TESSALON PERLES) 100 MG capsule Take 1 capsule (100 mg total) by mouth 3 (three) times daily as needed. 20 capsule 0   chlorthalidone (HYGROTON) 25 MG tablet TAKE 1 TABLET BY MOUTH EVERY DAY 90 tablet 1   Cholecalciferol (VITAMIN D3) 125 MCG (5000 UT) CAPS Take 5,000 Units by mouth daily.      Cyanocobalamin (VITAMIN B-12) 5000 MCG SUBL Place 5,000 mcg under the tongue 3 (three) times a week.     escitalopram (LEXAPRO) 10 MG tablet TAKE 1 TABLET BY MOUTH EVERY DAY 90 tablet 2   fluticasone (FLONASE) 50 MCG/ACT nasal spray Place 2 sprays into both nostrils daily as needed for allergies.     glipiZIDE (GLUCOTROL XL) 2.5 MG 24 hr tablet TAKE 1 TABLET (2.5 MG TOTAL) BY MOUTH DAILY WITH BREAKFAST. 90 tablet 3   metFORMIN (GLUCOPHAGE) 500 MG tablet TAKE 4 TABLETS BY MOUTH DAILY WITH BREAKFAST 360 tablet 3   Multiple Vitamin (MULTIVITAMIN WITH MINERALS) TABS tablet Take 1 tablet by mouth daily at 12 noon.     nitroGLYCERIN  (NITROSTAT) 0.4 MG SL tablet Place 1 tablet (0.4 mg total) under the tongue every 5 (five) minutes as needed for chest pain. 90 tablet 3   pantoprazole (PROTONIX) 40 MG tablet TAKE 1 TABLET BY MOUTH EVERY DAY 90 tablet 3   warfarin (COUMADIN) 10 MG tablet Take 1 tablet daily except take 1 1/2 tablets on Wed or Take as directed by anticoagulation clinic 105 tablet 1   No current facility-administered medications on file prior to visit.    Observations/Objective: Alert, NAD, appropriate mood and affect, resps normal, cn 2-12 intact, moves all 4s, no visible rash or swelling Lab Results  Component Value Date   WBC 6.7 03/10/2021   HGB 12.0 (L) 03/10/2021   HCT 38.0 (L) 03/10/2021  PLT 257.0 03/10/2021   GLUCOSE 100 (H) 03/10/2021   CHOL 93 03/10/2021   TRIG 83.0 03/10/2021   HDL 30.20 (L) 03/10/2021   LDLDIRECT 116.0 01/15/2020   LDLCALC 46 03/10/2021   ALT 23 03/10/2021   AST 17 03/10/2021   NA 141 03/10/2021   K 4.4 03/10/2021   CL 103 03/10/2021   CREATININE 1.26 03/10/2021   BUN 26 (H) 03/10/2021   CO2 30 03/10/2021   TSH 1.54 03/10/2021   PSA 4.02 (H) 03/10/2021   INR 2.9 08/04/2021   HGBA1C 7.8 (H) 03/10/2021   MICROALBUR 1.6 03/10/2021   Assessment and Plan: See notes  Follow Up Instructions: See notes   I discussed the assessment and treatment plan with the patient. The patient was provided an opportunity to ask questions and all were answered. The patient agreed with the plan and demonstrated an understanding of the instructions.   The patient was advised to call back or seek an in-person evaluation if the symptoms worsen or if the condition fails to improve as anticipated.  Cathlean Cower, MD

## 2021-08-26 NOTE — Assessment & Plan Note (Signed)
Lab Results  Component Value Date   HGBA1C 7.8 (H) 03/10/2021   Mild uncontrolled, pt to continue current medical treatment glucotrol, metformin and follow cbgs closely on prednisone

## 2021-08-26 NOTE — Patient Instructions (Signed)
Please take all new medication as prescribed 

## 2021-08-26 NOTE — Assessment & Plan Note (Signed)
With flare of right sciatica - for predpac asd,  to f/u any worsening symptoms or concerns

## 2021-08-26 NOTE — Assessment & Plan Note (Signed)
Apparently recurrent or rebounded - for paxlovid, cough med prn,  to f/u any worsening symptoms or concerns

## 2021-09-01 DIAGNOSIS — R350 Frequency of micturition: Secondary | ICD-10-CM | POA: Diagnosis not present

## 2021-09-06 ENCOUNTER — Other Ambulatory Visit: Payer: Self-pay | Admitting: Internal Medicine

## 2021-09-08 DIAGNOSIS — R972 Elevated prostate specific antigen [PSA]: Secondary | ICD-10-CM | POA: Diagnosis not present

## 2021-09-11 ENCOUNTER — Other Ambulatory Visit: Payer: Self-pay

## 2021-09-11 ENCOUNTER — Other Ambulatory Visit (INDEPENDENT_AMBULATORY_CARE_PROVIDER_SITE_OTHER): Payer: HMO

## 2021-09-11 DIAGNOSIS — N183 Chronic kidney disease, stage 3 unspecified: Secondary | ICD-10-CM | POA: Diagnosis not present

## 2021-09-11 DIAGNOSIS — N1831 Chronic kidney disease, stage 3a: Secondary | ICD-10-CM

## 2021-09-11 DIAGNOSIS — E538 Deficiency of other specified B group vitamins: Secondary | ICD-10-CM

## 2021-09-11 DIAGNOSIS — R972 Elevated prostate specific antigen [PSA]: Secondary | ICD-10-CM

## 2021-09-11 DIAGNOSIS — E1122 Type 2 diabetes mellitus with diabetic chronic kidney disease: Secondary | ICD-10-CM | POA: Diagnosis not present

## 2021-09-11 DIAGNOSIS — E559 Vitamin D deficiency, unspecified: Secondary | ICD-10-CM

## 2021-09-11 LAB — LIPID PANEL
Cholesterol: 116 mg/dL (ref 0–200)
HDL: 47.2 mg/dL (ref >39.00)
LDL Cholesterol: 58 mg/dL (ref 0–99)
NonHDL: 69.22
Total CHOL/HDL Ratio: 2
Triglycerides: 58 mg/dL (ref 0.0–149.0)
VLDL: 11.6 mg/dL (ref 0.0–40.0)

## 2021-09-11 LAB — BASIC METABOLIC PANEL
BUN: 30 mg/dL — ABNORMAL HIGH (ref 6–23)
CO2: 28 mEq/L (ref 19–32)
Calcium: 9.2 mg/dL (ref 8.4–10.5)
Chloride: 105 mEq/L (ref 96–112)
Creatinine, Ser: 1.41 mg/dL (ref 0.40–1.50)
GFR: 51.49 mL/min — ABNORMAL LOW (ref >60.00)
Glucose, Bld: 94 mg/dL (ref 70–99)
Potassium: 4.6 mEq/L (ref 3.5–5.1)
Sodium: 141 mEq/L (ref 135–145)

## 2021-09-11 LAB — HEPATIC FUNCTION PANEL
ALT: 22 U/L (ref 0–53)
AST: 18 U/L (ref 0–37)
Albumin: 4.2 g/dL (ref 3.5–5.2)
Alkaline Phosphatase: 63 U/L (ref 39–117)
Bilirubin, Direct: 0.2 mg/dL (ref 0.0–0.3)
Total Bilirubin: 0.7 mg/dL (ref 0.2–1.2)
Total Protein: 6.6 g/dL (ref 6.0–8.3)

## 2021-09-11 LAB — PHOSPHORUS: Phosphorus: 3.7 mg/dL (ref 2.3–4.6)

## 2021-09-11 LAB — HEMOGLOBIN A1C: Hgb A1c MFr Bld: 7.2 % — ABNORMAL HIGH (ref 4.6–6.5)

## 2021-09-11 LAB — VITAMIN B12: Vitamin B-12: >1550 pg/mL — ABNORMAL HIGH (ref 211–911)

## 2021-09-11 LAB — PSA: PSA: 5.05 ng/mL — ABNORMAL HIGH (ref 0.10–4.00)

## 2021-09-11 LAB — VITAMIN D 25 HYDROXY (VIT D DEFICIENCY, FRACTURES): VITD: 52.28 ng/mL (ref 30.00–100.00)

## 2021-09-12 LAB — PTH, INTACT AND CALCIUM
Calcium: 9.4 mg/dL (ref 8.6–10.3)
PTH: 101 pg/mL — ABNORMAL HIGH (ref 16–77)

## 2021-09-14 ENCOUNTER — Encounter: Payer: Self-pay | Admitting: Internal Medicine

## 2021-09-14 ENCOUNTER — Other Ambulatory Visit: Payer: Self-pay

## 2021-09-14 ENCOUNTER — Ambulatory Visit (INDEPENDENT_AMBULATORY_CARE_PROVIDER_SITE_OTHER): Payer: HMO | Admitting: Internal Medicine

## 2021-09-14 VITALS — BP 142/66 | HR 60 | Temp 98.8°F | Ht 74.0 in | Wt 262.0 lb

## 2021-09-14 DIAGNOSIS — R972 Elevated prostate specific antigen [PSA]: Secondary | ICD-10-CM

## 2021-09-14 DIAGNOSIS — Z7901 Long term (current) use of anticoagulants: Secondary | ICD-10-CM

## 2021-09-14 DIAGNOSIS — I10 Essential (primary) hypertension: Secondary | ICD-10-CM | POA: Diagnosis not present

## 2021-09-14 DIAGNOSIS — E538 Deficiency of other specified B group vitamins: Secondary | ICD-10-CM | POA: Diagnosis not present

## 2021-09-14 DIAGNOSIS — N183 Chronic kidney disease, stage 3 unspecified: Secondary | ICD-10-CM | POA: Diagnosis not present

## 2021-09-14 DIAGNOSIS — E782 Mixed hyperlipidemia: Secondary | ICD-10-CM | POA: Diagnosis not present

## 2021-09-14 DIAGNOSIS — N1831 Chronic kidney disease, stage 3a: Secondary | ICD-10-CM | POA: Diagnosis not present

## 2021-09-14 DIAGNOSIS — E1122 Type 2 diabetes mellitus with diabetic chronic kidney disease: Secondary | ICD-10-CM | POA: Diagnosis not present

## 2021-09-14 DIAGNOSIS — Z23 Encounter for immunization: Secondary | ICD-10-CM | POA: Diagnosis not present

## 2021-09-14 DIAGNOSIS — E559 Vitamin D deficiency, unspecified: Secondary | ICD-10-CM | POA: Diagnosis not present

## 2021-09-14 NOTE — Progress Notes (Signed)
Patient ID: [name], male   DOB: 02/25/1956, 65 y.o.   MRN: [id number]        Chief Complaint: follow up HTN, HLD and hyperglycemia, ckd, elevated psa       HPI:  [name] is a 65 y.o. male here overall doing ok, Pt denies chest pain, increased sob or doe, wheezing, orthopnea, PND, increased LE swelling, palpitations, dizziness or syncope.   Pt denies polydipsia, polyuria, or new focal neuro s/s.   Pt denies fever, wt loss, night sweats, loss of appetite, or other constitutional symptoms  Denies urinary symptoms such as dysuria, frequency, urgency, flank pain, hematuria or n/v, fever, chills, but psa has been rising, has biopsy pending in approx 2 wks.  Bp at home < 140/90, does not want oveall med changes today       Wt Readings from Last 3 Encounters:  09/14/21 262 lb (118.8 kg)  03/13/21 258 lb (117 kg)  02/27/21 256 lb (116.1 kg)   BP Readings from Last 3 Encounters:  09/14/21 (!) 142/66  05/10/21 124/74  03/13/21 120/78         Past Medical History:  Diagnosis Date   Arthritis    BACK PAIN    CAD (coronary artery disease) 06/28/2017   CAD in native artery    a. moderate by cardiac CT 2016.   Chronic bilateral low back pain with bilateral sciatica 01/12/2019   Degenerative arthritis of right knee 07/15/2016   Injected in 07/15/2016 Orthovisc injection started 10/07/2016 DepoMedrol 40 inj on 04/11/17, 25 cc aspirated Orthovisc injection in 05/30/2017     DEGENERATIVE JOINT DISEASE, KNEES, BILATERAL    Depression    Diabetes ([place]) 10/01/2014   Diabetes mellitus    type II   DIVERTICULOSIS, COLON    DVT, lower extremity ([place])    right   History of kidney stones    Hyperlipidemia    Hypertension    HYPOGONADISM, MALE    HYPOTENSION, ORTHOSTATIC    Kidney stones    Long term (current) use of anticoagulants 08/17/2017   Long term current use of anticoagulant    Lumbosacral radiculopathy at S1 06/14/2012   Maxillary sinus cyst 12/11/2018   Obesity    OBESITY, MORBID  03/04/2008   Qualifier: Diagnosis of  By: Jenny Reichmann MD, Hunt Oris    OSA (obstructive sleep apnea) 09/11/2013   Pulmonary emboli (HCC)    Pulmonary embolism ([place]) 02/05/2011   RBBB 07/28/2017   Renal insufficiency    a. prior h/o, does not appear chronic.   Superficial phlebitis of arm 03/18/2016   Vitamin D deficiency 07/09/2020   Past Surgical History:  Procedure Laterality Date   ankle surgury  2001 bilateral   with bone spurs and torn tendon   COLONOSCOPY  03/18/2010   EDG  2008   chronic Duodenitis   GASTRIC BYPASS     HEEL SPUR SURGERY Bilateral 2005   knee surgury  2000 & 2003    cartilage damage   LEFT HEART CATH AND CORONARY ANGIOGRAPHY N/A 02/16/2021   Procedure: LEFT HEART CATH AND CORONARY ANGIOGRAPHY;  Surgeon: Sherren Mocha, MD;  Location: [facility];  Service: Cardiovascular;  Laterality: N/A;   LUMBAR LAMINECTOMY/DECOMPRESSION MICRODISCECTOMY  09/28/2012   Procedure: LUMBAR LAMINECTOMY/DECOMPRESSION MICRODISCECTOMY 2 LEVELS;  Surgeon: [name], MD;  Location: [facility];  Service: Orthopedics;  Laterality: Left;  Decompressive laminectomy L4-L5. Microdiscectomy L5-S1   Skin removal surgery     extra abdominal skin removed from  his weight loss   TONSILLECTOMY AND ADENOIDECTOMY  age 70 or 25   TOTAL KNEE ARTHROPLASTY  01/10/2012   Procedure: TOTAL KNEE ARTHROPLASTY;  Surgeon: [name] Pole, MD;  Location: [facility];  Service: Orthopedics;  Laterality: Left;   TOTAL KNEE ARTHROPLASTY Right 11/18/2020   Procedure: TOTAL KNEE ARTHROPLASTY;  Surgeon: Paralee Cancel, MD;  Location: [facility];  Service: Orthopedics;  Laterality: Right;  70 mins   UPPER GASTROINTESTINAL ENDOSCOPY  03/18/2010    reports that he quit smoking about 42 years ago. His smoking use included cigarettes. He has a 1.25 pack-year smoking history. He has never used smokeless tobacco. He reports current alcohol use of about 2.0 standard drinks per week. He reports that he does not use drugs. family history  includes Diabetes in his brother, sister, sister, and sister; Heart failure in his sister; Hypertension in his mother; Prostate cancer in his father. Allergies  Allergen Reactions   Adhesive [Tape] Other (See Comments)    Painful, Please use "paper" tape   Other     NO BLOOD -JEHOVAH'S WITNESS-SIGNED REFUSAL BUT WOULD TAKE ALBUMIN   Current Outpatient Medications on File Prior to Visit  Medication Sig Dispense Refill   Artificial Tear Ointment (ARTIFICIAL TEARS) ointment Place 1 drop into both eyes every 4 (four) hours as needed (dry eyes).      atorvastatin (LIPITOR) 80 MG tablet Take 1 tablet (80 mg total) by mouth daily. 90 tablet 2   benzonatate (TESSALON PERLES) 100 MG capsule Take 1 capsule (100 mg total) by mouth 3 (three) times daily as needed. 20 capsule 0   chlorthalidone (HYGROTON) 25 MG tablet TAKE 1 TABLET BY MOUTH EVERY DAY 90 tablet 1   Cholecalciferol (VITAMIN D3) 125 MCG (5000 UT) CAPS Take 5,000 Units by mouth daily.      Cyanocobalamin (VITAMIN B-12) 5000 MCG SUBL Place 5,000 mcg under the tongue 3 (three) times a week.     escitalopram (LEXAPRO) 10 MG tablet TAKE 1 TABLET BY MOUTH EVERY DAY 90 tablet 2   fluticasone (FLONASE) 50 MCG/ACT nasal spray Place 2 sprays into both nostrils daily as needed for allergies.     glipiZIDE (GLUCOTROL XL) 2.5 MG 24 hr tablet TAKE 1 TABLET (2.5 MG TOTAL) BY MOUTH DAILY WITH BREAKFAST. 90 tablet 3   metFORMIN (GLUCOPHAGE) 500 MG tablet TAKE 4 TABLETS BY MOUTH DAILY WITH BREAKFAST 360 tablet 3   Multiple Vitamin (MULTIVITAMIN WITH MINERALS) TABS tablet Take 1 tablet by mouth daily at 12 noon.     nitroGLYCERIN (NITROSTAT) 0.4 MG SL tablet Place 1 tablet (0.4 mg total) under the tongue every 5 (five) minutes as needed for chest pain. 90 tablet 3   pantoprazole (PROTONIX) 40 MG tablet TAKE 1 TABLET BY MOUTH EVERY DAY 90 tablet 3   predniSONE (DELTASONE) 10 MG tablet 3 TABS BY MOUTH PER DAY FOR 3 DAYS,2TABS PER DAY FOR 3 DAYS,1TAB PER DAY FOR 3  DAYS 18 tablet 0   warfarin (COUMADIN) 10 MG tablet Take 1 tablet daily except take 1 1/2 tablets on Wed or Take as directed by anticoagulation clinic 105 tablet 1   No current facility-administered medications on file prior to visit.        ROS:  All others reviewed and negative.  Objective        PE:  BP (!) 142/66 (BP Location: Right Arm, Patient Position: Sitting, Cuff Size: Large)   Pulse 60   Temp 98.8 F (37.1 C) (Oral)   Ht  6\' 2"  (1.88 m)   Wt 262 lb (118.8 kg)   SpO2 97%   BMI 33.64 kg/m                 Constitutional: Pt appears in NAD               HENT: Head: NCAT.                Right Ear: External ear normal.                 Left Ear: External ear normal.                Eyes: . Pupils are equal, round, and reactive to light. Conjunctivae and EOM are normal               Nose: without d/c or deformity               Neck: Neck supple. Gross normal ROM               Cardiovascular: Normal rate and regular rhythm.                 Pulmonary/Chest: Effort normal and breath sounds without rales or wheezing.                Abd:  Soft, NT, ND, + BS, no organomegaly               Neurological: Pt is alert. At baseline orientation, motor grossly intact               Skin: Skin is warm. No rashes, no other new lesions, LE edema - none               Psychiatric: Pt behavior is normal without agitation   Micro: none  Cardiac tracings I have personally interpreted today:  none  Pertinent Radiological findings (summarize): none   Lab Results  Component Value Date   WBC 6.7 03/10/2021   HGB 12.0 (L) 03/10/2021   HCT 38.0 (L) 03/10/2021   PLT 257.0 03/10/2021   GLUCOSE 94 09/11/2021   CHOL 116 09/11/2021   TRIG 58.0 09/11/2021   HDL 47.20 09/11/2021   LDLDIRECT 116.0 01/15/2020   LDLCALC 58 09/11/2021   ALT 22 09/11/2021   AST 18 09/11/2021   NA 141 09/11/2021   K 4.6 09/11/2021   CL 105 09/11/2021   CREATININE 1.41 09/11/2021   BUN 30 (H) 09/11/2021   CO2 28  09/11/2021   TSH 1.54 03/10/2021   PSA 5.05 (H) 09/11/2021   INR 1.7 (A) 09/15/2021   HGBA1C 7.2 (H) 09/11/2021   MICROALBUR 1.6 03/10/2021   Assessment/Plan:  FORRESTER BLANDO is a 65 y.o. Black or African American [2] male with  has a past medical history of Arthritis, BACK PAIN, CAD (coronary artery disease) (06/28/2017), CAD in native artery, Chronic bilateral low back pain with bilateral sciatica (01/12/2019), Degenerative arthritis of right knee (07/15/2016), DEGENERATIVE JOINT DISEASE, KNEES, BILATERAL, Depression, Diabetes ([place]) (10/01/2014), Diabetes mellitus, DIVERTICULOSIS, COLON, DVT, lower extremity ([place]), History of kidney stones, Hyperlipidemia, Hypertension, HYPOGONADISM, MALE, HYPOTENSION, ORTHOSTATIC, Kidney stones, Long term (current) use of anticoagulants (08/17/2017), Long term current use of anticoagulant, Lumbosacral radiculopathy at S1 (06/14/2012), Maxillary sinus cyst (12/11/2018), Obesity, OBESITY, MORBID (03/04/2008), OSA (obstructive sleep apnea) (09/11/2013), Pulmonary emboli ([place]), Pulmonary embolism ([place]) (02/05/2011), RBBB (07/28/2017), Renal insufficiency, Superficial phlebitis of arm (03/18/2016), and Vitamin D deficiency (07/09/2020).  Hyperlipidemia Lab Results  Component  Value Date   LDLCALC 58 09/11/2021   Stable, pt to continue current statin lipitor 80   Essential hypertension BP Readings from Last 3 Encounters:  09/14/21 (!) 142/66  05/10/21 124/74  03/13/21 120/78   Mild uncontrolled, pt to continue medical treatment hct  - delcines med change today   Long term current use of anticoagulant For INR today per pt request, followed per coumadin clinic  Diabetes [facility]) Lab Results  Component Value Date   HGBA1C 7.2 (H) 09/11/2021   Mild uncontrolled,, pt to continue current medical treatment glucotrol, metformin, declines change today   Increased prostate specific antigen (PSA) velocity Lab Results  Component Value Date   PSA 5.05 (H) 09/11/2021   PSA  4.02 (H) 03/10/2021   PSA 2.92 01/15/2020   Worsening, pt already for prostate bx soon approx 2 wks  CKD (chronic kidney disease) stage 3, GFR 30-59 ml/min (HCC) Lab Results  Component Value Date   CREATININE 1.41 09/11/2021   Stable overall, cont to avoid nephrotoxins  Followup: Return in about 6 months (around 03/15/2022).  Cathlean Cower, MD 09/15/2021 10:25 PM [facility]

## 2021-09-14 NOTE — Patient Instructions (Signed)
Please continue all other medications as before, and refills have been done if requested.  Please have the pharmacy call with any other refills you may need.  Please continue your efforts at being more active, low cholesterol diet, and weight control.  Please keep your appointments with your specialists as you may have planned  Please make an Appointment to return in 6 months, or sooner if needed, also with Lab Appointment for testing done 3-5 days before at the [place] (so this is for TWO appointments - please see the scheduling desk as you leave)  Due to the ongoing Covid 19 pandemic, our lab now requires an appointment for any labs done at our office.  If you need labs done and do not have an appointment, please call our office ahead of time to schedule before presenting to the lab for your testing.

## 2021-09-15 ENCOUNTER — Encounter: Payer: Self-pay | Admitting: Internal Medicine

## 2021-09-15 ENCOUNTER — Ambulatory Visit (INDEPENDENT_AMBULATORY_CARE_PROVIDER_SITE_OTHER): Payer: HMO

## 2021-09-15 DIAGNOSIS — Z7901 Long term (current) use of anticoagulants: Secondary | ICD-10-CM

## 2021-09-15 LAB — POCT INR: INR: 1.7 — AB (ref 2.0–3.0)

## 2021-09-15 NOTE — Patient Instructions (Addendum)
Pre visit review using our clinic review tool, if applicable. No additional management support is needed unless otherwise documented below in the visit note.  Increase dose today to take 1 1/2  tablets and then continue to take 1 tablet daily .  Re-check in 6 weeks.

## 2021-09-15 NOTE — Assessment & Plan Note (Signed)
BP Readings from Last 3 Encounters:  09/14/21 (!) 142/66  05/10/21 124/74  03/13/21 120/78   Mild uncontrolled, pt to continue medical treatment hct  - delcines med change today

## 2021-09-15 NOTE — Assessment & Plan Note (Signed)
Lab Results  Component Value Date   LDLCALC 58 09/11/2021   Stable, pt to continue current statin lipitor 80

## 2021-09-15 NOTE — Assessment & Plan Note (Signed)
Lab Results  Component Value Date   PSA 5.05 (H) 09/11/2021   PSA 4.02 (H) 03/10/2021   PSA 2.92 01/15/2020   Worsening, pt already for prostate bx soon approx 2 wks

## 2021-09-15 NOTE — Assessment & Plan Note (Signed)
For INR today per pt request, followed per coumadin clinic

## 2021-09-15 NOTE — Assessment & Plan Note (Signed)
Lab Results  Component Value Date   HGBA1C 7.2 (H) 09/11/2021   Mild uncontrolled,, pt to continue current medical treatment glucotrol, metformin, declines change today

## 2021-09-15 NOTE — Assessment & Plan Note (Signed)
Lab Results  Component Value Date   CREATININE 1.41 09/11/2021   Stable overall, cont to avoid nephrotoxins

## 2021-10-16 ENCOUNTER — Telehealth: Payer: Self-pay | Admitting: Internal Medicine

## 2021-10-16 DIAGNOSIS — Z7901 Long term (current) use of anticoagulants: Secondary | ICD-10-CM

## 2021-10-16 NOTE — Telephone Encounter (Signed)
Pt. Requesting to come off of Coumadin clinic, for procedure 11.28.2022. Wants to know if he can continue to take Lovenox.   Please advoise.    Callback #- [phone]

## 2021-10-16 NOTE — Telephone Encounter (Signed)
Pt reports he is having a prostate biopsy and they have asked for a 5 day hold of coumadin. Advised pt there will be a schedule for his coumadin and lovenox and we can go over it at his apt on 11/22. Pt would like lovenox sent to [facility] on [address]   CrCl 84.85 Wt: 118kg  11/23: Last dose of coumadin, no lovenox 11/24: No coumadin, no lovenox 11/25: No coumadin, lovenox in AM and PM 11/26: No coumadin, lovenox in AM and PM 11/27: No coumadin, lovenox in AM ONLY  11/28: Procedure; NO COUMADIN, NO LOVENOX  11/29: Take 1 1/2 tablets coumadin, lovenox in AM and PM 11/30Take 1 1/2 tablets coumadin, lovenox in AM and PM 12/1: Take 1 1/2 tablets coumadin, lovenox in AM and PM 12/2: Take 1 1/2 tablets coumadin, lovenox in AM and PM 12/3: Take 10 mg coumadin, lovenox in AM and PM 12/4: Take 10 mg coumadin, lovenox in AM and PM 12/5: Take 10 mg coumadin, lovenox in AM and PM 12/6: Recheck INR; hold lovenox and hold coumadin until after INR check

## 2021-10-16 NOTE — Telephone Encounter (Signed)
Yes, ok for 5 days hold prior to biopsy

## 2021-10-20 ENCOUNTER — Other Ambulatory Visit: Payer: Self-pay | Admitting: Internal Medicine

## 2021-10-20 DIAGNOSIS — Z7901 Long term (current) use of anticoagulants: Secondary | ICD-10-CM

## 2021-10-20 MED ORDER — ENOXAPARIN SODIUM 120 MG/0.8ML IJ SOSY: 120.0000 mg | PREFILLED_SYRINGE | Freq: Two times a day (BID) | INTRAMUSCULAR | 0 refills | Status: DC

## 2021-10-20 NOTE — Progress Notes (Signed)
Zach Parthena Fergeson Gay [address] [place] Phone: [phone] Subjective:   IVilma Meckel, am serving as a scribe for Dr. Hulan Saas. This visit occurred during the SARS-CoV-2 public health emergency.  Safety protocols were in place, including screening questions prior to the visit, additional usage of staff PPE, and extensive cleaning of exam room while observing appropriate contact time as indicated for disinfecting solutions.   I'm seeing this patient by the request  of:  Biagio Borg, MD  CC: Left hip pain  [id number]  TAHJI Palisade is a 65 y.o. male coming in with complaint of L hip pain. Last seen in 2018 for knee pain. Patient states left gluteal near ischial tuberosity. Started in May or June, hurts more during the night than in the day. Pain from spot to mid thigh sometimes. Sitting is when pain is felt the most.       Past Medical History:  Diagnosis Date   Arthritis    BACK PAIN    CAD (coronary artery disease) 06/28/2017   CAD in native artery    a. moderate by cardiac CT 2016.   Chronic bilateral low back pain with bilateral sciatica 01/12/2019   Degenerative arthritis of right knee 07/15/2016   Injected in 07/15/2016 Orthovisc injection started 10/07/2016 DepoMedrol 40 inj on 04/11/17, 25 cc aspirated Orthovisc injection in 05/30/2017     DEGENERATIVE JOINT DISEASE, KNEES, BILATERAL    Depression    Diabetes ([place]) 10/01/2014   Diabetes mellitus    type II   DIVERTICULOSIS, COLON    DVT, lower extremity ([place])    right   History of kidney stones    Hyperlipidemia    Hypertension    HYPOGONADISM, MALE    HYPOTENSION, ORTHOSTATIC    Kidney stones    Long term (current) use of anticoagulants 08/17/2017   Long term current use of anticoagulant    Lumbosacral radiculopathy at S1 06/14/2012   Maxillary sinus cyst 12/11/2018   Obesity    OBESITY, MORBID 03/04/2008   Qualifier: Diagnosis of  By: Jenny Reichmann MD, Hunt Oris    OSA  (obstructive sleep apnea) 09/11/2013   Pulmonary emboli (HCC)    Pulmonary embolism ([place]) 02/05/2011   RBBB 07/28/2017   Renal insufficiency    a. prior h/o, does not appear chronic.   Superficial phlebitis of arm 03/18/2016   Vitamin D deficiency 07/09/2020   Past Surgical History:  Procedure Laterality Date   ankle surgury  2001 bilateral   with bone spurs and torn tendon   COLONOSCOPY  03/18/2010   EDG  2008   chronic Duodenitis   GASTRIC BYPASS     HEEL SPUR SURGERY Bilateral 2005   knee surgury  2000 & 2003    cartilage damage   LEFT HEART CATH AND CORONARY ANGIOGRAPHY N/A 02/16/2021   Procedure: LEFT HEART CATH AND CORONARY ANGIOGRAPHY;  Surgeon: Sherren Mocha, MD;  Location: [facility];  Service: Cardiovascular;  Laterality: N/A;   LUMBAR LAMINECTOMY/DECOMPRESSION MICRODISCECTOMY  09/28/2012   Procedure: LUMBAR LAMINECTOMY/DECOMPRESSION MICRODISCECTOMY 2 LEVELS;  Surgeon: [name], MD;  Location: [facility];  Service: Orthopedics;  Laterality: Left;  Decompressive laminectomy L4-L5. Microdiscectomy L5-S1   Skin removal surgery     extra abdominal skin removed from his weight loss   TONSILLECTOMY AND ADENOIDECTOMY  age 36 or 6   TOTAL KNEE ARTHROPLASTY  01/10/2012   Procedure: TOTAL KNEE ARTHROPLASTY;  Surgeon: [name], MD;  Location:  [facility];  Service: Orthopedics;  Laterality: Left;   TOTAL KNEE ARTHROPLASTY Right 11/18/2020   Procedure: TOTAL KNEE ARTHROPLASTY;  Surgeon: Paralee Cancel, MD;  Location: [facility];  Service: Orthopedics;  Laterality: Right;  70 mins   UPPER GASTROINTESTINAL ENDOSCOPY  03/18/2010   Social History   Socioeconomic History   Marital status: Married    Spouse name: Not on file   Number of children: 2   Years of education: Not on file   Highest education level: High school graduate  Occupational History   Occupation: Retired    Fish farm manager: Banker  Tobacco Use   Smoking status: Former    Packs/day: 0.25    Years: 5.00    Pack  years: 1.25    Types: Cigarettes    Quit date: 12/06/1978    Years since quitting: 42.9   Smokeless tobacco: Never  Vaping Use   Vaping Use: Never used  Substance and Sexual Activity   Alcohol use: Yes    Alcohol/week: 2.0 standard drinks    Types: 2 Standard drinks or equivalent per week   Drug use: No   Sexual activity: Not Currently  Other Topics Concern   Not on file  Social History Narrative   Daily caffeine use one per day   Protein drinks    Children; a friend and sister that helps   Brother that is around [place])   Son and dtr in [place]   Lives w/ wife in [place]   Left handed     Retired from [employer]   Former smoker 2 alcoholic beverages daily no caffeine no drug use no other tobacco at this time   Social Determinants of Radio broadcast assistant Strain: Not on file  Food Insecurity: Not on file  Transportation Needs: Not on file  Physical Activity: Not on file  Stress: Not on file  Social Connections: Not on file   Allergies  Allergen Reactions   Adhesive [Tape] Other (See Comments)    Painful, Please use "paper" tape   Other     NO BLOOD -JEHOVAH'S WITNESS-SIGNED REFUSAL BUT WOULD TAKE ALBUMIN   Family History  Problem Relation Age of Onset   Hypertension Mother    Prostate cancer Father    Diabetes Sister        died with DM 03/09/2001, 2 more sisters with diabetes   Heart failure Sister    Diabetes Brother    Diabetes Sister    Diabetes Sister    Colon cancer Neg Hx    Colon polyps Neg Hx    Esophageal cancer Neg Hx    Rectal cancer Neg Hx    Stomach cancer Neg Hx     Current Outpatient Medications (Endocrine & Metabolic):    glipiZIDE (GLUCOTROL XL) 2.5 MG 24 hr tablet, TAKE 1 TABLET (2.5 MG TOTAL) BY MOUTH DAILY WITH BREAKFAST.   metFORMIN (GLUCOPHAGE) 500 MG tablet, TAKE 4 TABLETS BY MOUTH DAILY WITH BREAKFAST   predniSONE (DELTASONE) 10 MG tablet, 3 TABS BY MOUTH PER DAY FOR 3 DAYS,2TABS PER DAY FOR 3 DAYS,1TAB PER DAY FOR 3  DAYS  Current Outpatient Medications (Cardiovascular):    atorvastatin (LIPITOR) 80 MG tablet, Take 1 tablet (80 mg total) by mouth daily.   chlorthalidone (HYGROTON) 25 MG tablet, TAKE 1 TABLET BY MOUTH EVERY DAY   nitroGLYCERIN (NITROSTAT) 0.4 MG SL tablet, Place 1 tablet (0.4 mg total) under the tongue every 5 (five) minutes as needed for chest pain.  Current Outpatient Medications (Respiratory):  benzonatate (TESSALON PERLES) 100 MG capsule, Take 1 capsule (100 mg total) by mouth 3 (three) times daily as needed.   fluticasone (FLONASE) 50 MCG/ACT nasal spray, Place 2 sprays into both nostrils daily as needed for allergies.   Current Outpatient Medications (Hematological):    Cyanocobalamin (VITAMIN B-12) 5000 MCG SUBL, Place 5,000 mcg under the tongue 3 (three) times a week.   enoxaparin (LOVENOX) 120 MG/0.8ML injection, Inject 0.8 mLs (120 mg total) into the skin every 12 (twelve) hours. As directed by anticoagulation clinic.   warfarin (COUMADIN) 10 MG tablet, Take 1 tablet daily or as directed by anticoagulation clinic  Current Outpatient Medications (Other):    gabapentin (NEURONTIN) 100 MG capsule, Take 2 capsules (200 mg total) by mouth at bedtime.   Artificial Tear Ointment (ARTIFICIAL TEARS) ointment, Place 1 drop into both eyes every 4 (four) hours as needed (dry eyes).    Cholecalciferol (VITAMIN D3) 125 MCG (5000 UT) CAPS, Take 5,000 Units by mouth daily.    escitalopram (LEXAPRO) 10 MG tablet, TAKE 1 TABLET BY MOUTH EVERY DAY   Multiple Vitamin (MULTIVITAMIN WITH MINERALS) TABS tablet, Take 1 tablet by mouth daily at 12 noon.   pantoprazole (PROTONIX) 40 MG tablet, TAKE 1 TABLET BY MOUTH EVERY DAY   Reviewed prior external information including notes and imaging from  primary care provider this includes most recent messages with patient having to bridge for a prostate biopsy with Lovenox. As well as notes that were available from care everywhere and other healthcare  systems.  Past medical history, social, surgical and family history all reviewed in electronic medical record.  No pertanent information unless stated regarding to the chief complaint.   Review of Systems:  No headache, visual changes, nausea, vomiting, diarrhea, constipation, dizziness, abdominal pain, skin rash, fevers, chills, night sweats, weight loss, swollen lymph nodes, joint swelling, chest pain, shortness of breath, mood changes. POSITIVE muscle aches, body aches  Objective  Blood pressure 118/68, pulse 63, height 6\' 2"  (1.88 m), weight 264 lb (119.7 kg), SpO2 96 %.   General: No apparent distress alert and oriented x3 mood and affect normal, dressed appropriately.  HEENT: Pupils equal, extraocular movements intact  Respiratory: Patient's speak in full sentences and does not appear short of breath  Cardiovascular: No lower extremity edema, non tender, no erythema  Gait normal with good balance and coordination.  MSK: Patient does have a positive straight leg with the left side.  Fairly severe with radicular symptoms going down the S1 distribution.  Patient is severely tender to palpation in the gluteal area on the left compared to the right.  Worsening pain in the back as well as the leg with extension but not with flexion.  Patient does not have significant amount of pain in the hamstring with resisted flexion of the knee.    Impression and Recommendations:     The above documentation has been reviewed and is accurate and complete Lyndal Pulley, DO

## 2021-10-20 NOTE — Telephone Encounter (Signed)
Pt compliant with coumadin management. Sent in refill. 

## 2021-10-20 NOTE — Telephone Encounter (Signed)
For the chlorthalidone - Please refill as per office routine med refill policy (all routine meds to be refilled for 3 mo or monthly (per pt preference) up to one year from last visit, then month to month grace period for 3 mo, then further med refills will have to be denied)  Please ask pt to request coumadin refill per his coumadin clinic

## 2021-10-20 NOTE — Telephone Encounter (Signed)
Advised pt dosing has been completed. Pt requested lovenox be sent in now. Advised it will be sent in and if anything is needed before apt on 11/22 to contact the office. Pt verbalized understanding.   Sent in lovenox script.

## 2021-10-21 ENCOUNTER — Other Ambulatory Visit: Payer: Self-pay

## 2021-10-21 ENCOUNTER — Ambulatory Visit: Payer: HMO | Admitting: Family Medicine

## 2021-10-21 DIAGNOSIS — M5417 Radiculopathy, lumbosacral region: Secondary | ICD-10-CM

## 2021-10-21 MED ORDER — GABAPENTIN 100 MG PO CAPS: 200.0000 mg | ORAL_CAPSULE | Freq: Every day | ORAL | 0 refills | Status: DC

## 2021-10-21 NOTE — Patient Instructions (Incomplete)
11/16: Last dose of coumadin, no lovenox 11/17: No coumadin, no lovenox 11/18: No coumadin, take lovenox in AM and PM 11/19: No coumadin, take lovenox in AM and PM 11/20: No coumadin, take lovenox in AM ONLY   11/21: Procedure; NO COUMADIN, NO LOVENOX   11/22: Take 1 1/2 tablets coumadin, take lovenox in AM and PM 11/23: Take 1 1/2 tablets coumadin, take lovenox in AM and PM 11/24: Take 1 1/2 tablets coumadin, take lovenox in AM and PM 11/25: Take 1 1/2 tablets coumadin, take lovenox in AM and PM 11/26: Take 1 tablet coumadin, take lovenox in AM and PM 11/27: Take 1 tablet coumadin, take lovenox in AM and PM 11/28: Take 1 tablet coumadin, take lovenox in AM and PM 11/29: Recheck INR; hold lovenox and hold coumadin until after INR check

## 2021-10-21 NOTE — Telephone Encounter (Signed)
Faxed new dosing schedule to Urology with attention Pattie.

## 2021-10-21 NOTE — Assessment & Plan Note (Signed)
Patient does have signs and symptoms that is consistent with potentially recurrent lumbosacral radicular symptoms.  Does correspond with an S1 impingement.  Differential does include the potential hamstring pathology as well.  Patient is tender to palpation in the piriformis area as well.  At this point would like to start on gabapentin and see how patient responds.  We will get x-rays on another date secondary to our machines being down the patient is doing worked up for elevated PSA and is having a biopsy in the near future.  Patient will follow up with me again in 6 to 8 weeks.

## 2021-10-21 NOTE — Patient Instructions (Addendum)
Come back tomorrow for xray Gabapentin 200mg  at night Do prescribed exercises at least 3x a week Thigh compression sleeve daily Heel lift See you again in 6 weeks, if pain worsens let us know

## 2021-10-21 NOTE — Progress Notes (Signed)
Prostate biopsy rescheduled for 11/21.  11/16: Last dose of coumadin, no lovenox 11/17: No coumadin, no lovenox 11/18: No coumadin, take lovenox in AM and PM 11/19: No coumadin, take lovenox in AM and PM 11/20: No coumadin, take lovenox in AM ONLY   11/21: Procedure; NO COUMADIN, NO LOVENOX   11/22: Take 1 1/2 tablets coumadin, take lovenox in AM and PM 11/23: Take 1 1/2 tablets coumadin, take lovenox in AM and PM 11/24: Take 1 1/2 tablets coumadin, take lovenox in AM and PM 11/25: Take 1 1/2 tablets coumadin, take lovenox in AM and PM 11/26: Take 1 tablet coumadin, take lovenox in AM and PM 11/27: Take 1 tablet coumadin, take lovenox in AM and PM 11/28: Take 1 tablet coumadin, take lovenox in AM and PM 11/29: Recheck INR; hold lovenox and hold coumadin until after INR check

## 2021-10-21 NOTE — Telephone Encounter (Signed)
Pattie from Dr. Louis Meckel office calling in  [place] she needs something faxed over to their office confirming the hold on the coumadin prior to biopsy for patient records  Phone number where she can be reached [phone] ext [phone]  Please fax to [phone]

## 2021-10-21 NOTE — Telephone Encounter (Addendum)
Pt reports the prostate biopsy was changed to Monday, 11/21. Adjusted dates on dosing and lovenox bridge. Pt will be in tomorrow for education and instructions for lovenox bridge and coumadin dosing.  Advised pt that today will be his last dose of coumadin until after the biopsy. Pt verbalized understanding and will be in tomorrow.  Updated dosing below:    11/16: Last dose of coumadin, no lovenox 11/17: No coumadin, no lovenox 11/18: No coumadin, take lovenox in AM and PM 11/19: No coumadin, take lovenox in AM and PM 11/20: No coumadin, take lovenox in AM ONLY  11/21: Procedure; NO COUMADIN, NO LOVENOX   11/22: Take 1 1/2 tablets coumadin, take lovenox in AM and PM 11/23: Take 1 1/2 tablets coumadin, take lovenox in AM and PM 11/24: Take 1 1/2 tablets coumadin, take lovenox in AM and PM 11/25: Take 1 1/2 tablets coumadin, take lovenox in AM and PM 11/26: Take 1 tablet coumadin, take lovenox in AM and PM 11/27: Take 1 tablet coumadin, take lovenox in AM and PM 11/28: Take 1 tablet coumadin, take lovenox in AM and PM 11/29: Recheck INR; hold lovenox and hold coumadin until after INR check

## 2021-10-22 ENCOUNTER — Other Ambulatory Visit: Payer: Self-pay

## 2021-10-22 ENCOUNTER — Ambulatory Visit (INDEPENDENT_AMBULATORY_CARE_PROVIDER_SITE_OTHER)
Admission: RE | Admit: 2021-10-22 | Discharge: 2021-10-22 | Disposition: A | Discharge status: Home / Self Care | Payer: HMO | Source: Ambulatory Visit | Attending: Family Medicine | Admitting: Family Medicine

## 2021-10-22 ENCOUNTER — Ambulatory Visit (INDEPENDENT_AMBULATORY_CARE_PROVIDER_SITE_OTHER): Payer: HMO

## 2021-10-22 DIAGNOSIS — Z7901 Long term (current) use of anticoagulants: Secondary | ICD-10-CM

## 2021-10-22 DIAGNOSIS — M545 Low back pain, unspecified: Secondary | ICD-10-CM | POA: Diagnosis not present

## 2021-10-22 LAB — POCT INR: INR: 2.5 (ref 2.0–3.0)

## 2021-10-22 NOTE — Progress Notes (Signed)
Patient ID: [name], male   DOB: 12-19-1955, 65 y.o.   MRN: [id number]  Medical screening examination/treatment/procedure(s) were performed by non-physician practitioner and as supervising physician I was immediately available for consultation/collaboration.  I agree with above. Cathlean Cower, MD

## 2021-10-26 DIAGNOSIS — C61 Malignant neoplasm of prostate: Secondary | ICD-10-CM | POA: Diagnosis not present

## 2021-10-26 DIAGNOSIS — R972 Elevated prostate specific antigen [PSA]: Secondary | ICD-10-CM | POA: Diagnosis not present

## 2021-10-27 ENCOUNTER — Ambulatory Visit: Payer: HMO

## 2021-10-28 ENCOUNTER — Ambulatory Visit (INDEPENDENT_AMBULATORY_CARE_PROVIDER_SITE_OTHER): Payer: HMO

## 2021-10-28 ENCOUNTER — Telehealth: Payer: Self-pay

## 2021-10-28 DIAGNOSIS — Z7901 Long term (current) use of anticoagulants: Secondary | ICD-10-CM | POA: Diagnosis not present

## 2021-10-28 LAB — POCT INR: INR: 1.2 — AB (ref 2.0–3.0)

## 2021-10-28 NOTE — Patient Instructions (Addendum)
Pre visit review using our clinic review tool, if applicable. No additional management support is needed unless otherwise documented below in the visit note.  Hold warfarin tomorrow morning and hold lovenox tonight. Advised if hematuria worsens to hold lovenox and warfarin until hematuria resolves. Advised restart warfarin and lovenox when hematuria resolves or is showing signs of getting better.

## 2021-10-28 NOTE — Telephone Encounter (Signed)
Pt c/o hematuria worsening since restarting his warfarin/lovenox after the prostate biopsy, which is why he was placed on a lovenox bridge. Reports he has contacted urology and they said there would be some hematuria from the procedure but that it is probably getting worse because of the warfarin/lovenox.  Advised pt should come in for INR check. Pt did take lovenox inj this morning but has not taken any warfarin today. Pt will come to the clinic now for INR check.  Will adjust medication as needed dependent on INR result.

## 2021-10-28 NOTE — Progress Notes (Signed)
Pt had prostate biopsy on 11/21 and was placed on a lovenox bridge by the coumadin clinic for the procedure. Pt called today to report he is having hematuria since 11/21 and it is getting darker red. Advised to come in for INR check.  Pt in today for INR check, which is 1.2 after restarting his warfarin yesterday at the 1 1/2 dosing instructions. Pt's wife came to the apt with the pt. Pt reports taking his warfarin this morning, and that he always takes it in the morning, so dose could not be held today. Pt also took lovenox this morning. Pt also c/o dysuria and frequency. Advised pt these are all side effects of a prostate biopsy but to hold warfarin tomorrow and hold lovenox tonight. Advised pt on risks of holding warfarin and lovenox. Advised if hematuria worsens to hold lovenox and warfarin until hematuria resolves. Advised to increase water intake and if any difficulty or inability to urinate for more than 8 hours go immediately to the ER. Advised pt of ER precautions. Advised pt and his wife on importance of holding warfarin if hematuria worsens, but to go to an UC or ER if worsening.  Pt has already contacted urology and they advised it is from restarting the warfarin. They advised to contact the coumadin clinic.

## 2021-11-02 ENCOUNTER — Observation Stay (HOSPITAL_COMMUNITY): Payer: HMO | Admitting: Anesthesiology

## 2021-11-02 ENCOUNTER — Inpatient Hospital Stay (HOSPITAL_COMMUNITY)
Admission: EM | Admit: 2021-11-02 | Discharge: 2021-11-08 | DRG: 908 | Disposition: A | Discharge status: Home / Self Care | Payer: HMO | Attending: Internal Medicine | Admitting: Internal Medicine

## 2021-11-02 ENCOUNTER — Observation Stay (HOSPITAL_COMMUNITY): Payer: HMO

## 2021-11-02 ENCOUNTER — Other Ambulatory Visit: Payer: Self-pay

## 2021-11-02 ENCOUNTER — Encounter (HOSPITAL_COMMUNITY): Payer: Self-pay

## 2021-11-02 ENCOUNTER — Encounter (HOSPITAL_COMMUNITY)
Admission: EM | Disposition: A | Discharge status: Home / Self Care | Payer: Self-pay | Source: Home / Self Care | Attending: Internal Medicine

## 2021-11-02 DIAGNOSIS — Z7984 Long term (current) use of oral hypoglycemic drugs: Secondary | ICD-10-CM | POA: Diagnosis not present

## 2021-11-02 DIAGNOSIS — Z9884 Bariatric surgery status: Secondary | ICD-10-CM | POA: Diagnosis not present

## 2021-11-02 DIAGNOSIS — K922 Gastrointestinal hemorrhage, unspecified: Secondary | ICD-10-CM | POA: Diagnosis not present

## 2021-11-02 DIAGNOSIS — E669 Obesity, unspecified: Secondary | ICD-10-CM | POA: Diagnosis present

## 2021-11-02 DIAGNOSIS — Z7901 Long term (current) use of anticoagulants: Secondary | ICD-10-CM | POA: Diagnosis not present

## 2021-11-02 DIAGNOSIS — N179 Acute kidney failure, unspecified: Secondary | ICD-10-CM | POA: Diagnosis present

## 2021-11-02 DIAGNOSIS — IMO0001 Reserved for inherently not codable concepts without codable children: Secondary | ICD-10-CM

## 2021-11-02 DIAGNOSIS — K91841 Postprocedural hemorrhage and hematoma of a digestive system organ or structure following other procedure: Principal | ICD-10-CM | POA: Diagnosis present

## 2021-11-02 DIAGNOSIS — Z20822 Contact with and (suspected) exposure to covid-19: Secondary | ICD-10-CM | POA: Diagnosis present

## 2021-11-02 DIAGNOSIS — Z87891 Personal history of nicotine dependence: Secondary | ICD-10-CM | POA: Diagnosis not present

## 2021-11-02 DIAGNOSIS — I10 Essential (primary) hypertension: Secondary | ICD-10-CM | POA: Diagnosis present

## 2021-11-02 DIAGNOSIS — E538 Deficiency of other specified B group vitamins: Secondary | ICD-10-CM

## 2021-11-02 DIAGNOSIS — F32A Depression, unspecified: Secondary | ICD-10-CM | POA: Diagnosis present

## 2021-11-02 DIAGNOSIS — R935 Abnormal findings on diagnostic imaging of other abdominal regions, including retroperitoneum: Secondary | ICD-10-CM | POA: Diagnosis not present

## 2021-11-02 DIAGNOSIS — K8020 Calculus of gallbladder without cholecystitis without obstruction: Secondary | ICD-10-CM | POA: Diagnosis not present

## 2021-11-02 DIAGNOSIS — D62 Acute posthemorrhagic anemia: Secondary | ICD-10-CM | POA: Diagnosis present

## 2021-11-02 DIAGNOSIS — E119 Type 2 diabetes mellitus without complications: Secondary | ICD-10-CM | POA: Diagnosis present

## 2021-11-02 DIAGNOSIS — R079 Chest pain, unspecified: Secondary | ICD-10-CM | POA: Clinically undetermined

## 2021-11-02 DIAGNOSIS — T45515A Adverse effect of anticoagulants, initial encounter: Secondary | ICD-10-CM | POA: Diagnosis present

## 2021-11-02 DIAGNOSIS — E785 Hyperlipidemia, unspecified: Secondary | ICD-10-CM | POA: Diagnosis present

## 2021-11-02 DIAGNOSIS — Z531 Procedure and treatment not carried out because of patient's decision for reasons of belief and group pressure: Secondary | ICD-10-CM | POA: Diagnosis present

## 2021-11-02 DIAGNOSIS — K921 Melena: Secondary | ICD-10-CM

## 2021-11-02 DIAGNOSIS — Z86718 Personal history of other venous thrombosis and embolism: Secondary | ICD-10-CM

## 2021-11-02 DIAGNOSIS — Z86711 Personal history of pulmonary embolism: Secondary | ICD-10-CM | POA: Diagnosis not present

## 2021-11-02 DIAGNOSIS — I2510 Atherosclerotic heart disease of native coronary artery without angina pectoris: Secondary | ICD-10-CM | POA: Diagnosis present

## 2021-11-02 DIAGNOSIS — R310 Gross hematuria: Secondary | ICD-10-CM | POA: Diagnosis not present

## 2021-11-02 DIAGNOSIS — Z8042 Family history of malignant neoplasm of prostate: Secondary | ICD-10-CM

## 2021-11-02 DIAGNOSIS — Y848 Other medical procedures as the cause of abnormal reaction of the patient, or of later complication, without mention of misadventure at the time of the procedure: Secondary | ICD-10-CM | POA: Diagnosis present

## 2021-11-02 DIAGNOSIS — Z96651 Presence of right artificial knee joint: Secondary | ICD-10-CM | POA: Diagnosis present

## 2021-11-02 DIAGNOSIS — C61 Malignant neoplasm of prostate: Secondary | ICD-10-CM | POA: Diagnosis present

## 2021-11-02 DIAGNOSIS — R319 Hematuria, unspecified: Secondary | ICD-10-CM | POA: Diagnosis not present

## 2021-11-02 DIAGNOSIS — K219 Gastro-esophageal reflux disease without esophagitis: Secondary | ICD-10-CM | POA: Diagnosis not present

## 2021-11-02 DIAGNOSIS — K625 Hemorrhage of anus and rectum: Secondary | ICD-10-CM | POA: Diagnosis not present

## 2021-11-02 DIAGNOSIS — M79602 Pain in left arm: Secondary | ICD-10-CM | POA: Diagnosis not present

## 2021-11-02 DIAGNOSIS — I82612 Acute embolism and thrombosis of superficial veins of left upper extremity: Secondary | ICD-10-CM | POA: Diagnosis present

## 2021-11-02 DIAGNOSIS — Z833 Family history of diabetes mellitus: Secondary | ICD-10-CM

## 2021-11-02 DIAGNOSIS — E559 Vitamin D deficiency, unspecified: Secondary | ICD-10-CM | POA: Diagnosis present

## 2021-11-02 DIAGNOSIS — E1169 Type 2 diabetes mellitus with other specified complication: Secondary | ICD-10-CM | POA: Diagnosis not present

## 2021-11-02 DIAGNOSIS — D6832 Hemorrhagic disorder due to extrinsic circulating anticoagulants: Secondary | ICD-10-CM | POA: Diagnosis present

## 2021-11-02 DIAGNOSIS — M7989 Other specified soft tissue disorders: Secondary | ICD-10-CM | POA: Diagnosis not present

## 2021-11-02 DIAGNOSIS — I2699 Other pulmonary embolism without acute cor pulmonale: Secondary | ICD-10-CM | POA: Diagnosis present

## 2021-11-02 DIAGNOSIS — R32 Unspecified urinary incontinence: Secondary | ICD-10-CM | POA: Diagnosis present

## 2021-11-02 DIAGNOSIS — Z79899 Other long term (current) drug therapy: Secondary | ICD-10-CM

## 2021-11-02 DIAGNOSIS — D649 Anemia, unspecified: Secondary | ICD-10-CM | POA: Diagnosis not present

## 2021-11-02 DIAGNOSIS — E782 Mixed hyperlipidemia: Secondary | ICD-10-CM | POA: Diagnosis not present

## 2021-11-02 HISTORY — PX: IR US GUIDE VASC ACCESS RIGHT: IMG2390

## 2021-11-02 HISTORY — PX: IR ANGIOGRAM VISCERAL SELECTIVE: IMG657

## 2021-11-02 HISTORY — PX: IR ANGIOGRAM PELVIS SELECTIVE OR SUPRASELECTIVE: IMG661

## 2021-11-02 HISTORY — PX: IR ANGIOGRAM SELECTIVE EACH ADDITIONAL VESSEL: IMG667

## 2021-11-02 HISTORY — PX: IR EMBO ART  VEN HEMORR LYMPH EXTRAV  INC GUIDE ROADMAPPING: IMG5450

## 2021-11-02 LAB — COMPREHENSIVE METABOLIC PANEL
ALT: 34 U/L (ref 0–44)
AST: 43 U/L — ABNORMAL HIGH (ref 15–41)
Albumin: 4 g/dL (ref 3.5–5.0)
Alkaline Phosphatase: 48 U/L (ref 38–126)
Anion gap: 9 (ref 5–15)
BUN: 31 mg/dL — ABNORMAL HIGH (ref 8–23)
CO2: 25 mmol/L (ref 22–32)
Calcium: 8.9 mg/dL (ref 8.9–10.3)
Chloride: 105 mmol/L (ref 98–111)
Creatinine, Ser: 1.29 mg/dL — ABNORMAL HIGH (ref 0.61–1.24)
GFR, Estimated: >60 mL/min (ref >60)
Glucose, Bld: 101 mg/dL — ABNORMAL HIGH (ref 70–99)
Potassium: 4.9 mmol/L (ref 3.5–5.1)
Sodium: 139 mmol/L (ref 135–145)
Total Bilirubin: 0.5 mg/dL (ref 0.3–1.2)
Total Protein: 6.8 g/dL (ref 6.5–8.1)

## 2021-11-02 LAB — HEMOGLOBIN AND HEMATOCRIT, BLOOD
HCT: 30.9 % — ABNORMAL LOW (ref 39.0–52.0)
HCT: 26 % — ABNORMAL LOW (ref 39.0–52.0)
HCT: 24 % — ABNORMAL LOW (ref 39.0–52.0)
Hemoglobin: 9.5 g/dL — ABNORMAL LOW (ref 13.0–17.0)
Hemoglobin: 8 g/dL — ABNORMAL LOW (ref 13.0–17.0)
Hemoglobin: 7.5 g/dL — ABNORMAL LOW (ref 13.0–17.0)

## 2021-11-02 LAB — GLUCOSE, CAPILLARY
Glucose-Capillary: 197 mg/dL — ABNORMAL HIGH (ref 70–99)
Glucose-Capillary: 207 mg/dL — ABNORMAL HIGH (ref 70–99)

## 2021-11-02 LAB — IRON AND TIBC
Iron: 102 ug/dL (ref 45–182)
Saturation Ratios: 33 % (ref 17.9–39.5)
TIBC: 306 ug/dL (ref 250–450)
UIBC: 204 ug/dL

## 2021-11-02 LAB — PROTIME-INR
INR: 1.5 — ABNORMAL HIGH (ref 0.8–1.2)
Prothrombin Time: 18.4 seconds — ABNORMAL HIGH (ref 11.4–15.2)

## 2021-11-02 LAB — TROPONIN I (HIGH SENSITIVITY)
Troponin I (High Sensitivity): 3 ng/L (ref <18)
Troponin I (High Sensitivity): 4 ng/L (ref <18)

## 2021-11-02 LAB — URINALYSIS, ROUTINE W REFLEX MICROSCOPIC
Bilirubin Urine: NEGATIVE
Glucose, UA: NEGATIVE mg/dL
Ketones, ur: NEGATIVE mg/dL
Leukocytes,Ua: NEGATIVE
Nitrite: NEGATIVE
Protein, ur: 30 mg/dL — AB
RBC / HPF: >50 RBC/hpf — ABNORMAL HIGH (ref 0–5)
Specific Gravity, Urine: 1.018 (ref 1.005–1.030)
pH: 5 (ref 5.0–8.0)

## 2021-11-02 LAB — CBC WITH DIFFERENTIAL/PLATELET
Abs Immature Granulocytes: 0.01 10*3/uL (ref 0.00–0.07)
Basophils Absolute: 0 10*3/uL (ref 0.0–0.1)
Basophils Relative: 0 %
Eosinophils Absolute: 0.2 10*3/uL (ref 0.0–0.5)
Eosinophils Relative: 3 %
HCT: 37.7 % — ABNORMAL LOW (ref 39.0–52.0)
Hemoglobin: 11.5 g/dL — ABNORMAL LOW (ref 13.0–17.0)
Immature Granulocytes: 0 %
Lymphocytes Relative: 38 %
Lymphs Abs: 2.3 10*3/uL (ref 0.7–4.0)
MCH: 22 pg — ABNORMAL LOW (ref 26.0–34.0)
MCHC: 30.5 g/dL (ref 30.0–36.0)
MCV: 72.2 fL — ABNORMAL LOW (ref 80.0–100.0)
Monocytes Absolute: 0.6 10*3/uL (ref 0.1–1.0)
Monocytes Relative: 10 %
Neutro Abs: 2.9 10*3/uL (ref 1.7–7.7)
Neutrophils Relative %: 49 %
Platelets: 191 10*3/uL (ref 150–400)
RBC: 5.22 MIL/uL (ref 4.22–5.81)
RDW: 16.9 % — ABNORMAL HIGH (ref 11.5–15.5)
WBC: 6 10*3/uL (ref 4.0–10.5)
nRBC: 0 % (ref 0.0–0.2)

## 2021-11-02 LAB — MRSA NEXT GEN BY PCR, NASAL: MRSA by PCR Next Gen: NOT DETECTED

## 2021-11-02 LAB — POC OCCULT BLOOD, ED: Fecal Occult Bld: POSITIVE — AB

## 2021-11-02 LAB — RESP PANEL BY RT-PCR (FLU A&B, COVID) ARPGX2
Influenza A by PCR: NEGATIVE
Influenza B by PCR: NEGATIVE
SARS Coronavirus 2 by RT PCR: NEGATIVE

## 2021-11-02 LAB — HIV ANTIBODY (ROUTINE TESTING W REFLEX): HIV Screen 4th Generation wRfx: NONREACTIVE

## 2021-11-02 LAB — CBG MONITORING, ED: Glucose-Capillary: 132 mg/dL — ABNORMAL HIGH (ref 70–99)

## 2021-11-02 SURGERY — CANCELLED PROCEDURE
Anesthesia: Monitor Anesthesia Care

## 2021-11-02 MED ORDER — POLYVINYL ALCOHOL 1.4 % OP SOLN
1.0000 [drp] | OPHTHALMIC | Status: DC | PRN
  Filled 2021-11-02: qty 15

## 2021-11-02 MED ORDER — VITAMIN K1 10 MG/ML IJ SOLN
5.0000 mg | Freq: Once | INTRAVENOUS | Status: AC
  Administered 2021-11-02: 11:00:00 5 mg via INTRAVENOUS
  Filled 2021-11-02: qty 0.5

## 2021-11-02 MED ORDER — GELATIN ABSORBABLE 12-7 MM EX MISC
CUTANEOUS | Status: AC
  Filled 2021-11-02: qty 1

## 2021-11-02 MED ORDER — FLEET ENEMA 7-19 GM/118ML RE ENEM: 1.0000 | ENEMA | Freq: Once | RECTAL | Status: DC

## 2021-11-02 MED ORDER — LIDOCAINE HCL (PF) 1 % IJ SOLN
INTRAMUSCULAR | Status: AC | PRN
  Administered 2021-11-02: 10 mL via INTRADERMAL

## 2021-11-02 MED ORDER — IOHEXOL 300 MG/ML  SOLN
100.0000 mL | Freq: Once | INTRAMUSCULAR | Status: AC | PRN
  Administered 2021-11-02: 15:00:00 50 mL via INTRA_ARTERIAL

## 2021-11-02 MED ORDER — LIDOCAINE HCL 1 % IJ SOLN
INTRAMUSCULAR | Status: AC
  Filled 2021-11-02: qty 20

## 2021-11-02 MED ORDER — CHLORTHALIDONE 25 MG PO TABS
25.0000 mg | ORAL_TABLET | Freq: Every day | ORAL | Status: DC
  Filled 2021-11-02: qty 1

## 2021-11-02 MED ORDER — IOHEXOL 300 MG/ML  SOLN
100.0000 mL | Freq: Once | INTRAMUSCULAR | Status: AC | PRN
  Administered 2021-11-02: 15:00:00 25 mL via INTRA_ARTERIAL

## 2021-11-02 MED ORDER — HYDRALAZINE HCL 20 MG/ML IJ SOLN: 10.0000 mg | Freq: Three times a day (TID) | INTRAMUSCULAR | Status: DC | PRN

## 2021-11-02 MED ORDER — SODIUM CHLORIDE 0.9 % IV BOLUS
500.0000 mL | Freq: Once | INTRAVENOUS | Status: AC
  Administered 2021-11-02: 12:00:00 500 mL via INTRAVENOUS

## 2021-11-02 MED ORDER — ATORVASTATIN CALCIUM 40 MG PO TABS
80.0000 mg | ORAL_TABLET | Freq: Every day | ORAL | Status: DC
  Administered 2021-11-02 – 2021-11-07 (×5): 80 mg via ORAL
  Filled 2021-11-02 (×5): qty 2

## 2021-11-02 MED ORDER — GABAPENTIN 100 MG PO CAPS
200.0000 mg | ORAL_CAPSULE | Freq: Every day | ORAL | Status: DC
  Administered 2021-11-02 – 2021-11-07 (×5): 200 mg via ORAL
  Filled 2021-11-02 (×5): qty 2

## 2021-11-02 MED ORDER — ONDANSETRON HCL 4 MG/2ML IJ SOLN
4.0000 mg | Freq: Three times a day (TID) | INTRAMUSCULAR | Status: DC | PRN
  Administered 2021-11-02 (×2): 4 mg via INTRAVENOUS
  Filled 2021-11-02 (×2): qty 2

## 2021-11-02 MED ORDER — FENTANYL CITRATE (PF) 100 MCG/2ML IJ SOLN
INTRAMUSCULAR | Status: AC
  Filled 2021-11-02: qty 2

## 2021-11-02 MED ORDER — SODIUM CHLORIDE (PF) 0.9 % IJ SOLN
INTRAMUSCULAR | Status: AC
  Filled 2021-11-02: qty 50

## 2021-11-02 MED ORDER — MIDAZOLAM HCL 2 MG/2ML IJ SOLN
INTRAMUSCULAR | Status: AC
  Filled 2021-11-02: qty 2

## 2021-11-02 MED ORDER — GELATIN ABSORBABLE 12-7 MM EX MISC
CUTANEOUS | Status: AC | PRN
  Administered 2021-11-02: 1

## 2021-11-02 MED ORDER — SODIUM CHLORIDE 0.9 % IV SOLN
INTRAVENOUS | Status: DC
Start: 2021-11-02 — End: 2021-11-05

## 2021-11-02 MED ORDER — IOHEXOL 350 MG/ML SOLN
100.0000 mL | Freq: Once | INTRAVENOUS | Status: AC | PRN
  Administered 2021-11-02: 11:00:00 100 mL via INTRAVENOUS

## 2021-11-02 MED ORDER — MORPHINE SULFATE (PF) 2 MG/ML IV SOLN
1.0000 mg | INTRAVENOUS | Status: DC | PRN
  Administered 2021-11-02 – 2021-11-04 (×2): 1 mg via INTRAVENOUS
  Filled 2021-11-02 (×2): qty 1

## 2021-11-02 MED ORDER — PANTOPRAZOLE SODIUM 40 MG IV SOLR
40.0000 mg | Freq: Two times a day (BID) | INTRAVENOUS | Status: DC
  Administered 2021-11-02 – 2021-11-07 (×12): 40 mg via INTRAVENOUS
  Filled 2021-11-02 (×13): qty 40

## 2021-11-02 MED ORDER — ESCITALOPRAM OXALATE 10 MG PO TABS
10.0000 mg | ORAL_TABLET | Freq: Every day | ORAL | Status: DC
  Administered 2021-11-03: 10 mg via ORAL
  Filled 2021-11-02: qty 1

## 2021-11-02 SURGICAL SUPPLY — 14 items

## 2021-11-02 NOTE — Progress Notes (Signed)
I stopped by and spoke with the patient about his pathology report from his prostate cancer biopsy. I provided information for him and his wife concerning his diagnosis. The patient has favorable, intermediate risk prostate cancer I briefly described for him the significance of this. He has an excellent prognosis. He needs to consider his treatment options, and then follow up with me in clinic for further discussion once he's recovered  from his current hospitalization. given his favorable, intermediate risk cancer, there's no urgency to initiate treatment, and we can discuss it in more detail once he has recovered and is able to come to clinic.

## 2021-11-02 NOTE — ED Provider Notes (Addendum)
[facility] DEPT Provider Note   CSN: [id number] Arrival date & time: 11/02/21  0240     History Chief Complaint  Patient presents with   Rectal Bleeding    [name] is a 65 y.o. male with medical history significant for diabetes, hypertension, hyperlipidemia, history of pulmonary embolisms for which he takes warfarin 10 mg 1 time a day (since 2008).  Patient states on 11/21 he underwent a biopsy of his prostate due to elevated PSA levels.  Patient states that he was placed on Lovenox bridge by his Coumadin clinic at this time.  Patient states that he has experienced hematuria since this procedure on 11/21, but was told that this was normal by his urology office.  Patient states beginning around 5:00PM on Sunday (yesterday) he had 6-7 episodes of bright red blood in his stool with some clots as well he noted. Patient adds that he called his urology office with this information and they advised him to present to ED for further workup. Patient denies any dizziness, weakness, abdominal pain, nausea, vomiting, diarrhea, fevers, shortness of breath, chest pain, excessive use of NSAIDs.   Rectal Bleeding Associated symptoms: no dizziness, no fever, no light-headedness and no vomiting       Past Medical History:  Diagnosis Date   Arthritis    BACK PAIN    CAD (coronary artery disease) 06/28/2017   CAD in native artery    a. moderate by cardiac CT 2016.   Chronic bilateral low back pain with bilateral sciatica 01/12/2019   Degenerative arthritis of right knee 07/15/2016   Injected in 07/15/2016 Orthovisc injection started 10/07/2016 DepoMedrol 40 inj on 04/11/17, 25 cc aspirated Orthovisc injection in 05/30/2017     DEGENERATIVE JOINT DISEASE, KNEES, BILATERAL    Depression    Diabetes ([place]) 10/01/2014   Diabetes mellitus    type II   DIVERTICULOSIS, COLON    DVT, lower extremity ([place])    right   History of kidney stones    Hyperlipidemia     Hypertension    HYPOGONADISM, MALE    HYPOTENSION, ORTHOSTATIC    Kidney stones    Long term (current) use of anticoagulants 08/17/2017   Long term current use of anticoagulant    Lumbosacral radiculopathy at S1 06/14/2012   Maxillary sinus cyst 12/11/2018   Obesity    OBESITY, MORBID 03/04/2008   Qualifier: Diagnosis of  By: Jenny Reichmann MD, Hunt Oris    OSA (obstructive sleep apnea) 09/11/2013   Pulmonary emboli (HCC)    Pulmonary embolism ([place]) 02/05/2011   RBBB 07/28/2017   Renal insufficiency    a. prior h/o, does not appear chronic.   Superficial phlebitis of arm 03/18/2016   Vitamin D deficiency 07/09/2020    Patient Active Problem List   Diagnosis Date Noted   COVID-19 virus infection 08/26/2021   CKD (chronic kidney disease) stage 3, GFR 30-59 ml/min (HCC) 03/14/2021   Elevated PSA 03/13/2021   B12 deficiency 03/13/2021   Acute blood loss anemia 12/14/2020   Osteoarthritis of right knee 11/18/2020   S/P right TKA 11/18/2020   Gross hematuria 09/28/2020   Left flank pain 09/23/2020   Dyspepsia 07/09/2020   Left groin pain 07/09/2020   Vitamin D deficiency 07/09/2020   Chronic bilateral low back pain with bilateral sciatica 01/12/2019   Chronic low back pain 01/12/2019   Headache 12/11/2018   AKI (acute kidney injury) ([place]) 12/11/2018   Nosebleed 12/11/2018   Maxillary sinus cyst 12/11/2018  Left leg pain 12/11/2018   Acute tubular injury of transplanted kidney ([place]) 12/11/2018   Lower limb pain, inferior, left 12/11/2018   Left hip pain 12/01/2018   Hip pain 12/01/2018   Acute upper respiratory infection 08/17/2018   Increased prostate specific antigen (PSA) velocity 07/12/2018   Pain in lower jaw 03/23/2018   Pain of left heel 12/21/2017   RBBB 07/28/2017   CAD (coronary artery disease) 06/28/2017   Coronary arteriosclerosis 06/28/2017   Degenerative arthritis of right knee 07/15/2016   Bursitis of shoulder 07/15/2016   Right shoulder pain 06/24/2016   Right knee pain  06/24/2016   Superficial phlebitis of arm 03/18/2016   Abdominal pain 12/23/2015   Rash 10/29/2015   Eruption cyst 10/29/2015   Chest pain at rest 03/27/2015   Dysuria 03/27/2015   Diabetes ([place]) 10/01/2014   S/P gastric bypass 08/07/2014   Supratherapeutic INR 08/07/2014   No blood products 07/17/2014   Nipple pain 03/13/2014   Breast mass in male 03/13/2014   Pain of breast 03/13/2014   Breast lump 03/13/2014   Encounter for therapeutic drug monitoring 01/25/2014   Esophageal reflux 12/19/2013   Diabetes mellitus type 2 in obese ([place]) 12/19/2013   Peripheral edema 10/10/2013   OSA (obstructive sleep apnea) 09/11/2013   Lesion of penis 01/01/2013   Lumbosacral radiculopathy at S1 06/14/2012   S/P left knee replacement 01/10/2012   Preventative health care 12/16/2011   Back pain with radiation 09/18/2011   Backache with radiation 09/18/2011   Long term current use of anticoagulant 04/08/2011   Pulmonary embolism ([place]) 02/05/2011   HYPOTENSION, ORTHOSTATIC 01/29/2010   CONSTIPATION 01/21/2010   DEGENERATIVE JOINT DISEASE, KNEES, BILATERAL 11/17/2009   SMOKER 09/12/2009   HYPOGONADISM, MALE 03/04/2008   OBESITY, MORBID 03/04/2008   Depression 10/05/2007   DIVERTICULOSIS, COLON 10/05/2007   COLONIC POLYPS, HX OF 10/05/2007   Hyperlipidemia 06/30/2007   Essential hypertension 06/30/2007   History of pulmonary embolus (PE) 06/13/2006    Past Surgical History:  Procedure Laterality Date   ankle surgury  2001 bilateral   with bone spurs and torn tendon   COLONOSCOPY  03/18/2010   EDG  2008   chronic Duodenitis   GASTRIC BYPASS     HEEL SPUR SURGERY Bilateral 2005   knee surgury  2000 & 2003    cartilage damage   LEFT HEART CATH AND CORONARY ANGIOGRAPHY N/A 02/16/2021   Procedure: LEFT HEART CATH AND CORONARY ANGIOGRAPHY;  Surgeon: Sherren Mocha, MD;  Location: [facility];  Service: Cardiovascular;  Laterality: N/A;   LUMBAR LAMINECTOMY/DECOMPRESSION  MICRODISCECTOMY  09/28/2012   Procedure: LUMBAR LAMINECTOMY/DECOMPRESSION MICRODISCECTOMY 2 LEVELS;  Surgeon: [name], MD;  Location: [facility];  Service: Orthopedics;  Laterality: Left;  Decompressive laminectomy L4-L5. Microdiscectomy L5-S1   Skin removal surgery     extra abdominal skin removed from his weight loss   TONSILLECTOMY AND ADENOIDECTOMY  age 51 or 6   TOTAL KNEE ARTHROPLASTY  01/10/2012   Procedure: TOTAL KNEE ARTHROPLASTY;  Surgeon: [name], MD;  Location: [facility];  Service: Orthopedics;  Laterality: Left;   TOTAL KNEE ARTHROPLASTY Right 11/18/2020   Procedure: TOTAL KNEE ARTHROPLASTY;  Surgeon: Paralee Cancel, MD;  Location: [facility];  Service: Orthopedics;  Laterality: Right;  70 mins   UPPER GASTROINTESTINAL ENDOSCOPY  03/18/2010       Family History  Problem Relation Age of Onset   Hypertension Mother    Prostate cancer Father    Diabetes Sister  died with DM 2002, 2 more sisters with diabetes   Heart failure Sister    Diabetes Brother    Diabetes Sister    Diabetes Sister    Colon cancer Neg Hx    Colon polyps Neg Hx    Esophageal cancer Neg Hx    Rectal cancer Neg Hx    Stomach cancer Neg Hx     Social History   Tobacco Use   Smoking status: Former    Packs/day: 0.25    Years: 5.00    Pack years: 1.25    Types: Cigarettes    Quit date: 12/06/1978    Years since quitting: 42.9   Smokeless tobacco: Never  Vaping Use   Vaping Use: Never used  Substance Use Topics   Alcohol use: Yes    Alcohol/week: 2.0 standard drinks    Types: 2 Standard drinks or equivalent per week   Drug use: No    Home Medications Prior to Admission medications   Medication Sig Start Date End Date Taking? Authorizing Provider  Artificial Tear Ointment (ARTIFICIAL TEARS) ointment Place 1 drop into both eyes every 4 (four) hours as needed (dry eyes).    Yes [provider]  atorvastatin (LIPITOR) 80 MG tablet Take 1 tablet (80 mg total) by mouth daily.  06/12/21  Yes Biagio Borg, MD  chlorthalidone (HYGROTON) 25 MG tablet TAKE 1 TABLET BY MOUTH EVERY DAY Patient taking differently: Take 25 mg by mouth daily. 10/20/21  Yes Biagio Borg, MD  Cholecalciferol (VITAMIN D3) 125 MCG (5000 UT) CAPS Take 5,000 Units by mouth daily.    Yes [provider]  Cyanocobalamin (VITAMIN B-12) 5000 MCG SUBL Place 5,000 mcg under the tongue 3 (three) times a week.   Yes [provider]  enoxaparin (LOVENOX) 120 MG/0.8ML injection Inject 0.8 mLs (120 mg total) into the skin every 12 (twelve) hours. As directed by anticoagulation clinic. 10/20/21  Yes Biagio Borg, MD  escitalopram (LEXAPRO) 10 MG tablet TAKE 1 TABLET BY MOUTH EVERY DAY Patient taking differently: Take 10 mg by mouth daily. 08/06/21  Yes Biagio Borg, MD  gabapentin (NEURONTIN) 100 MG capsule Take 2 capsules (200 mg total) by mouth at bedtime. 10/21/21  Yes Hulan Saas M, DO  glipiZIDE (GLUCOTROL XL) 2.5 MG 24 hr tablet TAKE 1 TABLET (2.5 MG TOTAL) BY MOUTH DAILY WITH BREAKFAST. 04/28/21  Yes Biagio Borg, MD  metFORMIN (GLUCOPHAGE) 500 MG tablet TAKE 4 TABLETS BY MOUTH DAILY WITH BREAKFAST Patient taking differently: Take 2,000 mg by mouth daily with breakfast. 06/02/21  Yes Biagio Borg, MD  Multiple Vitamin (MULTIVITAMIN WITH MINERALS) TABS tablet Take 1 tablet by mouth daily at 12 noon.   Yes [provider]  nitroGLYCERIN (NITROSTAT) 0.4 MG SL tablet Place 1 tablet (0.4 mg total) under the tongue every 5 (five) minutes as needed for chest pain. 07/30/15  Yes Dorothy Spark, MD  pantoprazole (PROTONIX) 40 MG tablet TAKE 1 TABLET BY MOUTH EVERY DAY Patient taking differently: 40 mg daily. 06/02/21  Yes Biagio Borg, MD  warfarin (COUMADIN) 10 MG tablet Take 1 tablet daily or as directed by anticoagulation clinic 10/20/21  Yes Biagio Borg, MD  fluticasone [facility]) 50 MCG/ACT nasal spray Place 2 sprays into both nostrils daily as needed for allergies. Patient not  taking: Reported on 11/02/2021 11/28/18   Rama, Venetia Maxon, MD    Allergies    Adhesive [tape] and Other  Review of Systems  Review of Systems  Constitutional:  Negative for fever.  Respiratory:  Negative for shortness of breath.   Cardiovascular:  Negative for chest pain.  Gastrointestinal:  Positive for blood in stool and hematochezia. Negative for diarrhea, nausea and vomiting.  Genitourinary:  Positive for frequency and hematuria. Negative for difficulty urinating and penile discharge.  Neurological:  Negative for dizziness, weakness and light-headedness.  All other systems reviewed and are negative.  Physical Exam Updated Vital Signs BP 134/86 (BP Location: Left Arm)   Pulse (!) 51   Temp 98.1 F (36.7 C) (Oral)   Resp (!) 24   Ht 6\' 2"  (1.88 m)   Wt 113.4 kg   SpO2 99%   BMI 32.10 kg/m   Physical Exam Vitals and nursing note reviewed. Exam conducted with a chaperone present.  Constitutional:      General: He is not in acute distress.    Appearance: He is not toxic-appearing.  HENT:     Head: Normocephalic and atraumatic.  Cardiovascular:     Rate and Rhythm: Regular rhythm. Bradycardia present.  Pulmonary:     Effort: Pulmonary effort is normal. No respiratory distress.     Breath sounds: Normal breath sounds. No wheezing.  Abdominal:     General: Abdomen is flat.     Palpations: Abdomen is soft.     Tenderness: There is no abdominal tenderness.  Genitourinary:    Rectum: Guaiac result positive. No anal fissure, external hemorrhoid or internal hemorrhoid.  Skin:    General: Skin is warm and dry.     Capillary Refill: Capillary refill takes less than 2 seconds.     Coloration: Skin is not jaundiced or pale.  Neurological:     Mental Status: He is alert and oriented to person, place, and time.  Psychiatric:        Behavior: Behavior normal.    ED Results / Procedures / Treatments   Labs (all labs ordered are listed, but only abnormal results are  displayed) Labs Reviewed  CBC WITH DIFFERENTIAL/PLATELET - Abnormal; Notable for the following components:      Result Value   Hemoglobin 11.5 (*)    HCT 37.7 (*)    MCV 72.2 (*)    MCH 22.0 (*)    RDW 16.9 (*)    All other components within normal limits  COMPREHENSIVE METABOLIC PANEL - Abnormal; Notable for the following components:   Glucose, Bld 101 (*)    BUN 31 (*)    Creatinine, Ser 1.29 (*)    AST 43 (*)    All other components within normal limits  URINALYSIS, ROUTINE W REFLEX MICROSCOPIC - Abnormal; Notable for the following components:   APPearance CLOUDY (*)    Hgb urine dipstick LARGE (*)    Protein, ur 30 (*)    RBC / HPF >50 (*)    Bacteria, UA MANY (*)    All other components within normal limits  POC OCCULT BLOOD, ED - Abnormal; Notable for the following components:   Fecal Occult Bld POSITIVE (*)    All other components within normal limits  CBG MONITORING, ED - Abnormal; Notable for the following components:   Glucose-Capillary 132 (*)    All other components within normal limits  RESP PANEL BY RT-PCR (FLU A&B, COVID) [id number]  PROTIME-INR    EKG None  Radiology No results found.  Procedures Procedures   Medications Ordered in ED Medications - No data to display  ED Course  I have reviewed the  triage vital signs and the nursing notes.  Pertinent labs & imaging results that were available during my care of the patient were reviewed by me and considered in my medical decision making (see chart for details).  Clinical Course as of 11/02/21 0650  Mon Nov 02, 2021  0517 BUN(!): 31 [CG]    Clinical Course User Index [CG] Azucena Cecil, [place]   MDM Rules/Calculators/A&P                          65 year old male presents for 6 episodes of bright red blood per rectum since 5 PM on 11/27.  On exam, patient is nontoxic-appearing, nonhypoxic, alert and oriented x4 in no apparent distress.  Patient recently underwent biopsy of his prostate on 11/21.  Patient is currently on warfarin.   Labs ordered and reviewed by me include CBC, CMP, INR, hemeoccult and UA.  Patient CBC has slight decrease in hemoglobin based on chart review.   Patient CMP has result of elevated creatinine and BUN but on chart review these value are in line with patient baseline.  Patient hemeoccult is grossly positive on visual inspection. There is scant BRBPR on rectal exam.  Patient UA is being analyzed.   Awaiting patient INR.  Current plan is to consult urology and have patient admitted in order to trend hemoglobin and ensure that Hgb remains stable.  Patient case discussed with Dr. Abner Greenspan [facility]) who agreed with plan to admit patient for further trending of bloodwork. Patient case will also be discussed with [facility] and hospitalist team. Patient has been made aware of current plan and is agreeable.   Final Clinical Impression(s) / ED Diagnoses Final diagnoses:  Hematochezia    Rx / DC Orders ED Discharge Orders     None        Azucena Cecil, PA-C 11/02/21 0650    Azucena Cecil, PA 11/02/21 [id number]    Merrily Pew, MD 11/02/21 [phone]

## 2021-11-02 NOTE — ED Provider Notes (Signed)
Appreciate consultation from [facility] Dr. Marylyn Ishihara who agrees to see and admit pt for serial trending of Hgb.    BP 134/86 (BP Location: Left Arm)   Pulse (!) 51   Temp 98.1 F (36.7 C) (Oral)   Resp (!) 24   Ht 6\' 2"  (1.88 m)   Wt 113.4 kg   SpO2 99%   BMI 32.10 kg/m   Results for orders placed or performed during the hospital encounter of 11/02/21  CBC with Differential  Result Value Ref Range   WBC 6.0 4.0 - 10.5 K/uL   RBC 5.22 4.22 - 5.81 MIL/uL   Hemoglobin 11.5 (L) 13.0 - 17.0 g/dL   HCT 37.7 (L) 39.0 - 52.0 %   MCV 72.2 (L) 80.0 - 100.0 fL   MCH 22.0 (L) 26.0 - 34.0 pg   MCHC 30.5 30.0 - 36.0 g/dL   RDW 16.9 (H) 11.5 - 15.5 %   Platelets 191 150 - 400 K/uL   nRBC 0.0 0.0 - 0.2 %   Neutrophils Relative % 49 %   Neutro Abs 2.9 1.7 - 7.7 K/uL   Lymphocytes Relative 38 %   Lymphs Abs 2.3 0.7 - 4.0 K/uL   Monocytes Relative 10 %   Monocytes Absolute 0.6 0.1 - 1.0 K/uL   Eosinophils Relative 3 %   Eosinophils Absolute 0.2 0.0 - 0.5 K/uL   Basophils Relative 0 %   Basophils Absolute 0.0 0.0 - 0.1 K/uL   WBC Morphology MORPHOLOGY UNREMARKABLE    Immature Granulocytes 0 %   Abs Immature Granulocytes 0.01 0.00 - 0.07 K/uL  Comprehensive metabolic panel  Result Value Ref Range   Sodium 139 135 - 145 mmol/L   Potassium 4.9 3.5 - 5.1 mmol/L   Chloride 105 98 - 111 mmol/L   CO2 25 22 - 32 mmol/L   Glucose, Bld 101 (H) 70 - 99 mg/dL   BUN 31 (H) 8 - 23 mg/dL   Creatinine, Ser 1.29 (H) 0.61 - 1.24 mg/dL   Calcium 8.9 8.9 - 10.3 mg/dL   Total Protein 6.8 6.5 - 8.1 g/dL   Albumin 4.0 3.5 - 5.0 g/dL   AST 43 (H) 15 - 41 U/L   ALT 34 0 - 44 U/L   Alkaline Phosphatase 48 38 - 126 U/L   Total Bilirubin 0.5 0.3 - 1.2 mg/dL   GFR, Estimated >60 >60 mL/min   Anion gap 9 5 - 15  Urinalysis, Routine w reflex microscopic  Result Value Ref Range   Color, Urine YELLOW YELLOW   APPearance CLOUDY (A) CLEAR   Specific Gravity, Urine 1.018 1.005 - 1.030   pH 5.0 5.0 - 8.0    Glucose, UA NEGATIVE NEGATIVE mg/dL   Hgb urine dipstick LARGE (A) NEGATIVE   Bilirubin Urine NEGATIVE NEGATIVE   Ketones, ur NEGATIVE NEGATIVE mg/dL   Protein, ur 30 (A) NEGATIVE mg/dL   Nitrite NEGATIVE NEGATIVE   Leukocytes,Ua NEGATIVE NEGATIVE   RBC / HPF >50 (H) 0 - 5 RBC/hpf   Bacteria, UA MANY (A) NONE SEEN   Squamous Epithelial / LPF 0-5 0 - 5  POC occult blood, ED Provider will collect  Result Value Ref Range   Fecal Occult Bld POSITIVE (A) NEGATIVE  CBG monitoring, ED  Result Value Ref Range   Glucose-Capillary 132 (H) 70 - 99 mg/dL   DG Lumbar Spine 2-3 Views  Result Date: 10/22/2021 CLINICAL DATA:  Lumbar spine pain. EXAM: LUMBAR SPINE - 2-3 VIEW COMPARISON:  Lumbar spine  x-ray 11/22/2017 FINDINGS: Alignment is anatomic. There is no evidence for acute fracture. There is moderate disc space narrowing at L4-L5 and L5-S1 which is similar to the prior study. There are atherosclerotic calcifications of the aorta. There is a surgical clip in the left abdomen. There is a large amount of stool in the distal colon and rectum. IMPRESSION: Stable moderate degenerative changes at L4-L5 and L5-S1. Large amount of stool in the distal colon and rectum. Electronically Signed   By: Ronney Asters M.D.   On: 10/22/2021 20:43      Domenic Moras, PA-C 11/02/21 [id number]    Valarie Merino, MD 11/03/21 [phone]

## 2021-11-02 NOTE — H&P (Signed)
History and Physical    [name] OEU:[id number] DOB: [birth year] DOA: 11/02/2021  PCP: Biagio Borg, MD  Patient coming from: Home  Chief Complaint: rectal bleeding  HPI: [name] is a 65 y.o. male with medical history significant of DM2, HTN, PE on coumadin. Presenting with rectal bleeding. His symptoms started around 1700hrs yesterday. He noticed a large amount of BRBPR with his stool He did not have any abdominal pain or cramping. He has not had any CP, dyspnea, lightheadedness or dizziness. He did have some nausea. He reports that he had a recent prostate Bx; and has noticed some hematuria. He reports that he's been on a lovenox bridge w/ his coumadin. He denies NSAID use. His bloody BM continued every one to two hours throughout the evening. When his symptoms did not slow, he decided to come to the ED for help. He denies any other aggravating or alleviating factors.   ED Course: Noted to have a stable Hgb. Noted to be FOBT positive and have hematuria. Urology and [facility] were consulted. [facility] was called for admission.   Review of Systems:  Denies CP, palpitations, dyspnea, lightheadedness, dizziness, V/D, fever. Reports some N. Review of systems is otherwise negative for all not mentioned in HPI.   PMHx Past Medical History:  Diagnosis Date   Arthritis    BACK PAIN    CAD (coronary artery disease) 06/28/2017   CAD in native artery    a. moderate by cardiac CT 2016.   Chronic bilateral low back pain with bilateral sciatica 01/12/2019   Degenerative arthritis of right knee 07/15/2016   Injected in 07/15/2016 Orthovisc injection started 10/07/2016 DepoMedrol 40 inj on 04/11/17, 25 cc aspirated Orthovisc injection in 05/30/2017     DEGENERATIVE JOINT DISEASE, KNEES, BILATERAL    Depression    Diabetes ([place]) 10/01/2014   Diabetes mellitus    type II   DIVERTICULOSIS, COLON    DVT, lower extremity ([place])    right   History of kidney stones    Hyperlipidemia    Hypertension     HYPOGONADISM, MALE    HYPOTENSION, ORTHOSTATIC    Kidney stones    Long term (current) use of anticoagulants 08/17/2017   Long term current use of anticoagulant    Lumbosacral radiculopathy at S1 06/14/2012   Maxillary sinus cyst 12/11/2018   Obesity    OBESITY, MORBID 03/04/2008   Qualifier: Diagnosis of  By: Jenny Reichmann MD, Hunt Oris    OSA (obstructive sleep apnea) 09/11/2013   Pulmonary emboli ([facility])    Pulmonary embolism ([place]) 02/05/2011   RBBB 07/28/2017   Renal insufficiency    a. prior h/o, does not appear chronic.   Superficial phlebitis of arm 03/18/2016   Vitamin D deficiency 07/09/2020    PSHx Past Surgical History:  Procedure Laterality Date   ankle surgury  2001 bilateral   with bone spurs and torn tendon   COLONOSCOPY  03/18/2010   EDG  2008   chronic Duodenitis   GASTRIC BYPASS     HEEL SPUR SURGERY Bilateral 2005   knee surgury  2000 & 2003    cartilage damage   LEFT HEART CATH AND CORONARY ANGIOGRAPHY N/A 02/16/2021   Procedure: LEFT HEART CATH AND CORONARY ANGIOGRAPHY;  Surgeon: Sherren Mocha, MD;  Location: [facility];  Service: Cardiovascular;  Laterality: N/A;   LUMBAR LAMINECTOMY/DECOMPRESSION MICRODISCECTOMY  09/28/2012   Procedure: LUMBAR LAMINECTOMY/DECOMPRESSION MICRODISCECTOMY 2 LEVELS;  Surgeon: [name], MD;  Location: [facility];  Service: Orthopedics;  Laterality: Left;  Decompressive laminectomy L4-L5. Microdiscectomy L5-S1   Skin removal surgery     extra abdominal skin removed from his weight loss   TONSILLECTOMY AND ADENOIDECTOMY  age 74 or 6   TOTAL KNEE ARTHROPLASTY  01/10/2012   Procedure: TOTAL KNEE ARTHROPLASTY;  Surgeon: [name], MD;  Location: [facility];  Service: Orthopedics;  Laterality: Left;   TOTAL KNEE ARTHROPLASTY Right 11/18/2020   Procedure: TOTAL KNEE ARTHROPLASTY;  Surgeon: Paralee Cancel, MD;  Location: [facility];  Service: Orthopedics;  Laterality: Right;  70 mins   UPPER GASTROINTESTINAL ENDOSCOPY  03/18/2010    SocHx   reports that he quit smoking about 42 years ago. His smoking use included cigarettes. He has a 1.25 pack-year smoking history. He has never used smokeless tobacco. He reports current alcohol use of about 2.0 standard drinks per week. He reports that he does not use drugs.  Allergies  Allergen Reactions   Adhesive [Tape] Other (See Comments)    Painful, Please use "paper" tape   Other     NO BLOOD -JEHOVAH'S WITNESS-SIGNED REFUSAL BUT WOULD TAKE ALBUMIN    FamHx Family History  Problem Relation Age of Onset   Hypertension Mother    Prostate cancer Father    Diabetes Sister        died with DM 2001-03-05, 2 more sisters with diabetes   Heart failure Sister    Diabetes Brother    Diabetes Sister    Diabetes Sister    Colon cancer Neg Hx    Colon polyps Neg Hx    Esophageal cancer Neg Hx    Rectal cancer Neg Hx    Stomach cancer Neg Hx     Prior to Admission medications   Medication Sig Start Date End Date Taking? Authorizing Provider  Artificial Tear Ointment (ARTIFICIAL TEARS) ointment Place 1 drop into both eyes every 4 (four) hours as needed (dry eyes).    Yes [provider]  atorvastatin (LIPITOR) 80 MG tablet Take 1 tablet (80 mg total) by mouth daily. 06/12/21  Yes Biagio Borg, MD  chlorthalidone (HYGROTON) 25 MG tablet TAKE 1 TABLET BY MOUTH EVERY DAY Patient taking differently: Take 25 mg by mouth daily. 10/20/21  Yes Biagio Borg, MD  Cholecalciferol (VITAMIN D3) 125 MCG (5000 UT) CAPS Take 5,000 Units by mouth daily.    Yes [provider]  Cyanocobalamin (VITAMIN B-12) 5000 MCG SUBL Place 5,000 mcg under the tongue 3 (three) times a week.   Yes [provider]  enoxaparin (LOVENOX) 120 MG/0.8ML injection Inject 0.8 mLs (120 mg total) into the skin every 12 (twelve) hours. As directed by anticoagulation clinic. 10/20/21  Yes Biagio Borg, MD  escitalopram (LEXAPRO) 10 MG tablet TAKE 1 TABLET BY MOUTH EVERY DAY Patient taking differently: Take 10 mg  by mouth daily. 08/06/21  Yes Biagio Borg, MD  gabapentin (NEURONTIN) 100 MG capsule Take 2 capsules (200 mg total) by mouth at bedtime. 10/21/21  Yes Hulan Saas M, DO  glipiZIDE (GLUCOTROL XL) 2.5 MG 24 hr tablet TAKE 1 TABLET (2.5 MG TOTAL) BY MOUTH DAILY WITH BREAKFAST. 04/28/21  Yes Biagio Borg, MD  metFORMIN (GLUCOPHAGE) 500 MG tablet TAKE 4 TABLETS BY MOUTH DAILY WITH BREAKFAST Patient taking differently: Take 2,000 mg by mouth daily with breakfast. 06/02/21  Yes Biagio Borg, MD  Multiple Vitamin (MULTIVITAMIN WITH MINERALS) TABS tablet Take 1 tablet by mouth daily at 12 noon.   Yes [provider]  nitroGLYCERIN (NITROSTAT) 0.4 MG SL tablet Place 1 tablet (0.4 mg total) under the tongue every 5 (five) minutes as needed for chest pain. 07/30/15  Yes Dorothy Spark, MD  pantoprazole (PROTONIX) 40 MG tablet TAKE 1 TABLET BY MOUTH EVERY DAY Patient taking differently: 40 mg daily. 06/02/21  Yes Biagio Borg, MD  warfarin (COUMADIN) 10 MG tablet Take 1 tablet daily or as directed by anticoagulation clinic 10/20/21  Yes Biagio Borg, MD  fluticasone [facility]) 50 MCG/ACT nasal spray Place 2 sprays into both nostrils daily as needed for allergies. Patient not taking: Reported on 11/02/2021 11/28/18   Rama, Venetia Maxon, MD    Physical Exam: Vitals:   11/02/21 0247 11/02/21 0405 11/02/21 0512 11/02/21 0623  BP: 135/83 122/78 124/83 134/86  Pulse: 62 (!) 54 (!) 57 (!) 51  Resp: 16 16 16  (!) 24  Temp: 98.1 F (36.7 C)     TempSrc: Oral     SpO2: 98% 97% 98% 99%  Weight: 113.4 kg     Height: 6\' 2"  (1.88 m)       General: 65 y.o. male resting in bed in NAD Eyes: PERRL, normal sclera ENMT: Nares patent w/o discharge, orophaynx clear, dentition normal, ears w/o discharge/lesions/ulcers Neck: Supple, trachea midline Cardiovascular: RRR, +S1, S2, no m/g/r, equal pulses throughout Respiratory: CTABL, no w/r/r, normal WOB GI: BS+, NDNT, no masses noted, no organomegaly  noted MSK: No e/c/c Neuro: A&O x 3, no focal deficits Psyc: Appropriate interaction and affect, calm/cooperative  Labs on Admission: I have personally reviewed following labs and imaging studies  CBC: Recent Labs  Lab 11/02/21 0340  WBC 6.0  NEUTROABS 2.9  HGB 11.5*  HCT 37.7*  MCV 72.2*  PLT 878   Basic Metabolic Panel: Recent Labs  Lab 11/02/21 0340  NA 139  K 4.9  CL 105  CO2 25  GLUCOSE 101*  BUN 31*  CREATININE 1.29*  CALCIUM 8.9   GFR: Estimated Creatinine Clearance: 74.4 mL/min (A) (by C-G formula based on SCr of 1.29 mg/dL (H)). Liver Function Tests: Recent Labs  Lab 11/02/21 0340  AST 43*  ALT 34  ALKPHOS 48  BILITOT 0.5  PROT 6.8  ALBUMIN 4.0   No results for input(s): LIPASE, AMYLASE in the last 168 hours. No results for input(s): AMMONIA in the last 168 hours. Coagulation Profile: Recent Labs  Lab 10/28/21 0000  INR 1.2*   Cardiac Enzymes: No results for input(s): CKTOTAL, CKMB, CKMBINDEX, TROPONINI in the last 168 hours. BNP (last 3 results) No results for input(s): PROBNP in the last 8760 hours. HbA1C: No results for input(s): HGBA1C in the last 72 hours. CBG: Recent Labs  Lab 11/02/21 0636  GLUCAP 132*   Lipid Profile: No results for input(s): CHOL, HDL, LDLCALC, TRIG, CHOLHDL, LDLDIRECT in the last 72 hours. Thyroid Function Tests: No results for input(s): TSH, T4TOTAL, FREET4, T3FREE, THYROIDAB in the last 72 hours. Anemia Panel: No results for input(s): VITAMINB12, FOLATE, FERRITIN, TIBC, IRON, RETICCTPCT in the last 72 hours. Urine analysis:    Component Value Date/Time   COLORURINE YELLOW 11/02/2021 0615   APPEARANCEUR CLOUDY (A) 11/02/2021 0615   LABSPEC 1.018 11/02/2021 0615   PHURINE 5.0 11/02/2021 0615   GLUCOSEU NEGATIVE 11/02/2021 0615   GLUCOSEU NEGATIVE 03/10/2021 0900   HGBUR LARGE (A) 11/02/2021 0615   BILIRUBINUR NEGATIVE 11/02/2021 0615   BILIRUBINUR negative 09/11/2013 1815   KETONESUR NEGATIVE  11/02/2021 0615   PROTEINUR 30 (A) 11/02/2021 0615  UROBILINOGEN 0.2 03/10/2021 0900   NITRITE NEGATIVE 11/02/2021 0615   LEUKOCYTESUR NEGATIVE 11/02/2021 0615    Radiological Exams on Admission: No results found.  EKG: None obtained in ED  Assessment/Plan GIB Microcytic anemia     - place in obs, tele     - trend q6h H&H     - holding coumadin for now; last INR is 1.5     - [facility] consulted, appreciate assistance; keep NPO until seen by GI     - he has declined pRBCs transfusion; he is ok w/ iron infusion     - vitals are stable; other than rectal bleed, he is asymptomatic at this time     - check iron studies  Hematuria     - recent prostate Bx     - urology onboard, appreciate assistance, defer further w/u to them  Hx of PE; on coumadin     - last INR is 1.5     - hold coumadin for now   DM2     - A1c, SSI, glucose checks     - he's NPO for now, but when off NPO status will have DM diet  HTN     - resume home regimen when off NPO status     - PRNs available for now  DVT prophylaxis: SCDs  Code Status: FULL  Family Communication: w/ wife at bedside  Consults called: EDPA spoke with urology (Dr. Abner Greenspan) and [facility] (Dr. Ardis Hughs)   Status is: Observation  The patient remains OBS appropriate and will d/c before 2 midnights.  Jonnie Finner DO [facility]  If 7PM-7AM, please contact night-coverage [email or web address]  11/02/2021, 7:16 AM

## 2021-11-02 NOTE — Procedures (Signed)
Interventional Radiology Procedure Note  Procedure:  1) Abdominal aortogram 2) Selective angiography of the IMA, superior rectal artery, bilateral internal iliac arteries, bilateral inferior rectal arteries 3) Gelfoam embolization of right inferior rectal artery  Findings: Please refer to procedural dictation for full description.  No active extravasation visualized.  Irregularity of distal right inferior rectal branches, so prophylactic gelfoam embolization performed.  Right CFA access, 6 Fr Angioseal closure.  Complications: None immediate  Estimated Blood Loss: < 5 mL  Recommendations: Strict 4 hour bedrest - flat for 2 hours (until 17:00) followed by head of bed up to 30 degrees for 2 hours (17:00-19:00). IR will follow along.     Ruthann Cancer, MD Pager: [phone]

## 2021-11-02 NOTE — Sedation Documentation (Signed)
Angioseal placed by Dr Serafina Royals.

## 2021-11-02 NOTE — Consult Note (Addendum)
Consultation  Referring Provider:  [facility]/ Marylyn Ishihara DO Primary Care Physician:  Biagio Borg, MD Primary Gastroenterologist:  Dr.Gessner  Reason for Consultation:  GI bleed  HPI: [name] is a 65 y.o. male, Who presented to the emergency room early this morning after onset of acute rectal bleeding last evening.  Patient says that he woke up from sleep with incontinence of bright red blood.  He had 4-5 more episodes of bright red blood per rectum prior to arrival in the emergency room and has had an additional 4-5 episodes since arrival.  No acute abdominal pain or cramping, no associated nausea or vomiting, thus far has been hemodynamically stable, denies dizziness or lightheadedness, no shortness of breath or chest pain. Baseline hemoglobin around 13, hemoglobin 12 on arrival and 11.5 at last check/MCV 72. Patient generally on chronic Coumadin after a pulmonary embolism in 2008.  He had undergone a prostate biopsy on 10/26/2021, and had been off Coumadin for that and was bridging with Lovenox.  Over the past 3 days he has been on Coumadin and Lovenox. Last dose of Coumadin yesterday morning, last dose of Lovenox 8 PM last night. INR 1.5/pro time 18.4  UA on admit positive for greater than 50 RBCs and many bacteria. He has been evaluated by urology in the emergency room, degree of hematuria is to be expected post prostate biopsy.  On rectal exam, red blood on exam glove, nontender  Patient is status post Roux-en-Y gastric bypass for obesity 2014.  He says he did have an episode of GI bleeding shortly after his surgery but has not had any other history of GI bleeding. Known to Dr. Carlean Purl with screening colonoscopy in September 2021 normal, and EGD October 2021 showed a healthy-appearing Roux-en-Y anastomosis, there was a single staple in place and noted mild gastritis.  This was done for complaints of epigastric pain.   Patient is a Sales promotion account executive Witness, and will not take blood  transfusion.  Past Medical History:  Diagnosis Date   Arthritis    BACK PAIN    CAD (coronary artery disease) 06/28/2017   CAD in native artery    a. moderate by cardiac CT 2016.   Chronic bilateral low back pain with bilateral sciatica 01/12/2019   Degenerative arthritis of right knee 07/15/2016   Injected in 07/15/2016 Orthovisc injection started 10/07/2016 DepoMedrol 40 inj on 04/11/17, 25 cc aspirated Orthovisc injection in 05/30/2017     DEGENERATIVE JOINT DISEASE, KNEES, BILATERAL    Depression    Diabetes ([place]) 10/01/2014   Diabetes mellitus    type II   DIVERTICULOSIS, COLON    DVT, lower extremity ([place])    right   History of kidney stones    Hyperlipidemia    Hypertension    HYPOGONADISM, MALE    HYPOTENSION, ORTHOSTATIC    Kidney stones    Long term (current) use of anticoagulants 08/17/2017   Long term current use of anticoagulant    Lumbosacral radiculopathy at S1 06/14/2012   Maxillary sinus cyst 12/11/2018   Obesity    OBESITY, MORBID 03/04/2008   Qualifier: Diagnosis of  By: Jenny Reichmann MD, Hunt Oris    OSA (obstructive sleep apnea) 09/11/2013   Pulmonary emboli (HCC)    Pulmonary embolism ([place]) 02/05/2011   RBBB 07/28/2017   Renal insufficiency    a. prior h/o, does not appear chronic.   Superficial phlebitis of arm 03/18/2016   Vitamin D deficiency 07/09/2020    Past Surgical History:  Procedure Laterality  Date   ankle surgury  2001 bilateral   with bone spurs and torn tendon   COLONOSCOPY  03/18/2010   EDG  2008   chronic Duodenitis   GASTRIC BYPASS     HEEL SPUR SURGERY Bilateral 2005   knee surgury  2000 & 2003    cartilage damage   LEFT HEART CATH AND CORONARY ANGIOGRAPHY N/A 02/16/2021   Procedure: LEFT HEART CATH AND CORONARY ANGIOGRAPHY;  Surgeon: Sherren Mocha, MD;  Location: [facility];  Service: Cardiovascular;  Laterality: N/A;   LUMBAR LAMINECTOMY/DECOMPRESSION MICRODISCECTOMY  09/28/2012   Procedure: LUMBAR LAMINECTOMY/DECOMPRESSION  MICRODISCECTOMY 2 LEVELS;  Surgeon: [name], MD;  Location: [facility];  Service: Orthopedics;  Laterality: Left;  Decompressive laminectomy L4-L5. Microdiscectomy L5-S1   Skin removal surgery     extra abdominal skin removed from his weight loss   TONSILLECTOMY AND ADENOIDECTOMY  age 57 or 6   TOTAL KNEE ARTHROPLASTY  01/10/2012   Procedure: TOTAL KNEE ARTHROPLASTY;  Surgeon: [name], MD;  Location: [facility];  Service: Orthopedics;  Laterality: Left;   TOTAL KNEE ARTHROPLASTY Right 11/18/2020   Procedure: TOTAL KNEE ARTHROPLASTY;  Surgeon: Paralee Cancel, MD;  Location: [facility];  Service: Orthopedics;  Laterality: Right;  70 mins   UPPER GASTROINTESTINAL ENDOSCOPY  03/18/2010    Prior to Admission medications   Medication Sig Start Date End Date Taking? Authorizing Provider  Artificial Tear Ointment (ARTIFICIAL TEARS) ointment Place 1 drop into both eyes every 4 (four) hours as needed (dry eyes).    Yes [provider]  atorvastatin (LIPITOR) 80 MG tablet Take 1 tablet (80 mg total) by mouth daily. 06/12/21  Yes Biagio Borg, MD  chlorthalidone (HYGROTON) 25 MG tablet TAKE 1 TABLET BY MOUTH EVERY DAY Patient taking differently: Take 25 mg by mouth daily. 10/20/21  Yes Biagio Borg, MD  Cholecalciferol (VITAMIN D3) 125 MCG (5000 UT) CAPS Take 5,000 Units by mouth daily.    Yes [provider]  Cyanocobalamin (VITAMIN B-12) 5000 MCG SUBL Place 5,000 mcg under the tongue 3 (three) times a week.   Yes [provider]  enoxaparin (LOVENOX) 120 MG/0.8ML injection Inject 0.8 mLs (120 mg total) into the skin every 12 (twelve) hours. As directed by anticoagulation clinic. 10/20/21  Yes Biagio Borg, MD  escitalopram (LEXAPRO) 10 MG tablet TAKE 1 TABLET BY MOUTH EVERY DAY Patient taking differently: Take 10 mg by mouth daily. 08/06/21  Yes Biagio Borg, MD  gabapentin (NEURONTIN) 100 MG capsule Take 2 capsules (200 mg total) by mouth at bedtime. 10/21/21  Yes Hulan Saas M, DO  glipiZIDE (GLUCOTROL XL) 2.5 MG 24 hr tablet TAKE 1 TABLET (2.5 MG TOTAL) BY MOUTH DAILY WITH BREAKFAST. 04/28/21  Yes Biagio Borg, MD  metFORMIN (GLUCOPHAGE) 500 MG tablet TAKE 4 TABLETS BY MOUTH DAILY WITH BREAKFAST Patient taking differently: Take 2,000 mg by mouth daily with breakfast. 06/02/21  Yes Biagio Borg, MD  Multiple Vitamin (MULTIVITAMIN WITH MINERALS) TABS tablet Take 1 tablet by mouth daily at 12 noon.   Yes [provider]  nitroGLYCERIN (NITROSTAT) 0.4 MG SL tablet Place 1 tablet (0.4 mg total) under the tongue every 5 (five) minutes as needed for chest pain. 07/30/15  Yes Dorothy Spark, MD  pantoprazole (PROTONIX) 40 MG tablet TAKE 1 TABLET BY MOUTH EVERY DAY Patient taking differently: 40 mg daily. 06/02/21  Yes Biagio Borg, MD  warfarin (COUMADIN) 10 MG tablet Take 1 tablet  daily or as directed by anticoagulation clinic 10/20/21  Yes Biagio Borg, MD  fluticasone [facility]) 50 MCG/ACT nasal spray Place 2 sprays into both nostrils daily as needed for allergies. Patient not taking: Reported on 11/02/2021 11/28/18   Rama, Venetia Maxon, MD    Current Facility-Administered Medications  Medication Dose Route Frequency Provider Last Rate Last Admin   artificial tears ointment 1 drop  1 drop Both Eyes Q4H PRN Marylyn Ishihara, Tyrone A, DO       atorvastatin (LIPITOR) tablet 80 mg  80 mg Oral QHS Kyle, Tyrone A, DO       [START ON 11/03/2021] chlorthalidone (HYGROTON) tablet 25 mg  25 mg Oral Daily Kyle, Tyrone A, DO       [START ON 11/03/2021] escitalopram (LEXAPRO) tablet 10 mg  10 mg Oral Daily Kyle, Tyrone A, DO       gabapentin (NEURONTIN) capsule 200 mg  200 mg Oral QHS Kyle, Tyrone A, DO       hydrALAZINE (APRESOLINE) injection 10 mg  10 mg Intravenous Q8H PRN Marylyn Ishihara, Tyrone A, DO       pantoprazole (PROTONIX) injection 40 mg  40 mg Intravenous Q12H Kyle, Tyrone A, DO       Current Outpatient Medications  Medication Sig Dispense Refill   Artificial Tear  Ointment (ARTIFICIAL TEARS) ointment Place 1 drop into both eyes every 4 (four) hours as needed (dry eyes).      atorvastatin (LIPITOR) 80 MG tablet Take 1 tablet (80 mg total) by mouth daily. 90 tablet 2   chlorthalidone (HYGROTON) 25 MG tablet TAKE 1 TABLET BY MOUTH EVERY DAY (Patient taking differently: Take 25 mg by mouth daily.) 90 tablet 3   Cholecalciferol (VITAMIN D3) 125 MCG (5000 UT) CAPS Take 5,000 Units by mouth daily.      Cyanocobalamin (VITAMIN B-12) 5000 MCG SUBL Place 5,000 mcg under the tongue 3 (three) times a week.     enoxaparin (LOVENOX) 120 MG/0.8ML injection Inject 0.8 mLs (120 mg total) into the skin every 12 (twelve) hours. As directed by anticoagulation clinic. 15.2 mL 0   escitalopram (LEXAPRO) 10 MG tablet TAKE 1 TABLET BY MOUTH EVERY DAY (Patient taking differently: Take 10 mg by mouth daily.) 90 tablet 2   gabapentin (NEURONTIN) 100 MG capsule Take 2 capsules (200 mg total) by mouth at bedtime. 60 capsule 0   glipiZIDE (GLUCOTROL XL) 2.5 MG 24 hr tablet TAKE 1 TABLET (2.5 MG TOTAL) BY MOUTH DAILY WITH BREAKFAST. 90 tablet 3   metFORMIN (GLUCOPHAGE) 500 MG tablet TAKE 4 TABLETS BY MOUTH DAILY WITH BREAKFAST (Patient taking differently: Take 2,000 mg by mouth daily with breakfast.) 360 tablet 3   Multiple Vitamin (MULTIVITAMIN WITH MINERALS) TABS tablet Take 1 tablet by mouth daily at 12 noon.     nitroGLYCERIN (NITROSTAT) 0.4 MG SL tablet Place 1 tablet (0.4 mg total) under the tongue every 5 (five) minutes as needed for chest pain. 90 tablet 3   pantoprazole (PROTONIX) 40 MG tablet TAKE 1 TABLET BY MOUTH EVERY DAY (Patient taking differently: 40 mg daily.) 90 tablet 3   warfarin (COUMADIN) 10 MG tablet Take 1 tablet daily or as directed by anticoagulation clinic 105 tablet 1   fluticasone (FLONASE) 50 MCG/ACT nasal spray Place 2 sprays into both nostrils daily as needed for allergies. (Patient not taking: Reported on 11/02/2021)      Allergies as of 11/02/2021 - Review  Complete 11/02/2021  Allergen Reaction Noted   Adhesive [tape] Other (See Comments)  03/22/2016   Other  09/26/2012    Family History  Problem Relation Age of Onset   Hypertension Mother    Prostate cancer Father    Diabetes Sister        died with DM 03/16/2001, 2 more sisters with diabetes   Heart failure Sister    Diabetes Brother    Diabetes Sister    Diabetes Sister    Colon cancer Neg Hx    Colon polyps Neg Hx    Esophageal cancer Neg Hx    Rectal cancer Neg Hx    Stomach cancer Neg Hx     Social History   Socioeconomic History   Marital status: Married    Spouse name: Not on file   Number of children: 2   Years of education: Not on file   Highest education level: High school graduate  Occupational History   Occupation: Retired    Fish farm manager: Banker  Tobacco Use   Smoking status: Former    Packs/day: 0.25    Years: 5.00    Pack years: 1.25    Types: Cigarettes    Quit date: 12/06/1978    Years since quitting: 42.9   Smokeless tobacco: Never  Vaping Use   Vaping Use: Never used  Substance and Sexual Activity   Alcohol use: Yes    Alcohol/week: 2.0 standard drinks    Types: 2 Standard drinks or equivalent per week   Drug use: No   Sexual activity: Not Currently  Other Topics Concern   Not on file  Social History Narrative   Daily caffeine use one per day   Protein drinks    Children; a friend and sister that helps   Brother that is around [name])   Son and dtr in [place]   Lives w/ wife in [place]   Left handed     Retired from [employer]   Former smoker 2 alcoholic beverages daily no caffeine no drug use no other tobacco at this time   Social Determinants of Radio broadcast assistant Strain: Not on file  Food Insecurity: Not on file  Transportation Needs: Not on file  Physical Activity: Not on file  Stress: Not on file  Social Connections: Not on file  Intimate Partner Violence: Not on file    Review of Systems: Pertinent positive and  negative review of systems were noted in the above HPI section.  All other review of systems was otherwise negative.   Physical Exam: Vital signs in last 24 hours: Temp:  [98.1 F (36.7 C)] 98.1 F (36.7 C) (11/28 0247) Pulse Rate:  [51-62] 51 (11/28 0623) Resp:  [16-24] 24 (11/28 0623) BP: (122-135)/(78-86) 134/86 (11/28 0623) SpO2:  [97 %-99 %] 99 % (11/28 0623) Weight:  [113.4 kg] 113.4 kg (11/28 0247)   General:   Alert,  Well-developed, well-nourished, older African-American male pleasant and cooperative in NAD.  Wife at bedside Head:  Normocephalic and atraumatic. Eyes:  Sclera clear, no icterus.   Conjunctiva pink. Ears:  Normal auditory acuity. Nose:  No deformity, discharge,  or lesions. Mouth:  No deformity or lesions.   Neck:  Supple; no masses or thyromegaly. Lungs:  Clear throughout to auscultation.   No wheezes, crackles, or rhonchi.  Heart:  Regular rate and rhythm; no murmurs, clicks, rubs,  or gallops. Abdomen:  Soft, very mild tenderness in the periumbilical area BS active,nonpalp mass or hsm.   Rectal: Not repeated, done by urology this a.m. with bright red blood on glove  Msk:  Symmetrical without gross deformities. . Pulses:  Normal pulses noted. Extremities:  Without clubbing or edema. Neurologic:  Alert and  oriented x4;  grossly normal neurologically. Skin:  Intact without significant lesions or rashes.. Psych:  Alert and cooperative. Normal mood and affect.  Intake/Output from previous day: No intake/output data recorded. Intake/Output this shift: No intake/output data recorded.  Lab Results: Recent Labs    11/02/21 0340  WBC 6.0  HGB 11.5*  HCT 37.7*  PLT 191   BMET Recent Labs    11/02/21 0340  NA 139  K 4.9  CL 105  CO2 25  GLUCOSE 101*  BUN 31*  CREATININE 1.29*  CALCIUM 8.9   LFT Recent Labs    11/02/21 0340  PROT 6.8  ALBUMIN 4.0  AST 43*  ALT 34  ALKPHOS 48  BILITOT 0.5   PT/INR Recent Labs    11/02/21 0638   LABPROT 18.4*  INR 1.5*     IMPRESSION:  #60 65 year old African-American male with acute GI bleed in setting of Coumadin and Lovenox.  INR 1.5 on admission. Multiple episodes of bright red blood per rectum since onset last evening.  Patient hemodynamically stable  Etiology of bleeding is not clear, BUN is mildly elevated raising possibility of upper GI source i.e. anastomotic ulceration in setting of Roux-en-Y gastric bypass  Consider bleed secondary to prostate biopsy site  No diverticuli noted at colonoscopy 1 year ago  #2 mild anemia secondary to above #3 status post Roux-en-Y gastric bypass 2014 for obesity #4 early stage prostate cancer-new diagnosis #5 adult onset diabetes mellitus #6.  Hypertension #7.  Hyperlipidemia #8 Jehovah's Witness-no blood transfusions  Plan; keep n.p.o. Serial hemoglobins IV vitamin K 5 mg now IV PPI twice daily Stop Lovenox Hold Coumadin Have scheduled for stat CT abdomen pelvis with angio-if positive will need to proceed to IR for embolization If negative will plan for EGD and flexible sigmoidoscopy with Dr. Henrene Pastor later this afternoon.  Procedures were discussed in detail with the patient including indications risks and benefits and he is agreeable to proceed.  GI will follow with you    Amy Esterwood PA-C 11/02/2021, 9:18 AM  GI ATTENDING  History, laboratories, x-rays, prior endoscopy reports reviewed.  Agree with comprehensive consultation note as outlined above.  The patient presents with acute lower GI bleeding.  This in the setting of recent prostate biopsies and chronic anticoagulation.  He does have a history of Roux-en-Y gastric bypass surgery.  He is also up-to-date on colorectal neoplasia screening with unremarkable complete colonoscopy last year.  I am suspicious that he is bleeding from his biopsy site.  He has been set up for CT angiography.  Further plans to be determined thereafter.  ADDENDUM: CT angiogram is positive for  acute bleeding in the rectum.  This is certainly from recent biopsies.  In any event, IR planning embolization therapy.  Urology aware.  We will follow.  Thanks.  Docia Chuck. Geri Seminole., M.D. [facility] Division of Gastroenterology

## 2021-11-02 NOTE — Sedation Documentation (Signed)
Patient is resting comfortably, snoring lightly, in NAD. 

## 2021-11-02 NOTE — ED Triage Notes (Signed)
Pt states that he had a prostate biopsy on Tuesday and last night he started bleeding from his rectum x 6 times. Pt reports being on a blood thinner and says that he stopped taking it as ordered before and after the procedure. Pt is currently back on it.

## 2021-11-02 NOTE — Anesthesia Preprocedure Evaluation (Deleted)
Anesthesia Evaluation  Patient identified by MRN, date of birth, ID band Patient awake    Reviewed: Allergy & Precautions, H&P , NPO status , Patient's Chart, lab work & pertinent test results  Airway Mallampati: II  TM Distance: >3 FB Neck ROM: Full    Dental no notable dental hx.    Pulmonary neg pulmonary ROS, former smoker,    Pulmonary exam normal breath sounds clear to auscultation       Cardiovascular hypertension, Pt. on medications + CAD  Normal cardiovascular exam Rhythm:Regular Rate:Normal     Neuro/Psych negative neurological ROS  negative psych ROS   GI/Hepatic negative GI ROS, Neg liver ROS,   Endo/Other  diabetes, Type 2  Renal/GU negative Renal ROS  negative genitourinary   Musculoskeletal negative musculoskeletal ROS (+)   Abdominal   Peds negative pediatric ROS (+)  Hematology  (+) anemia , On coumadin   Anesthesia Other Findings   Reproductive/Obstetrics negative OB ROS                             Anesthesia Physical Anesthesia Plan  ASA: 3  Anesthesia Plan: MAC   Post-op Pain Management: Minimal or no pain anticipated   Induction: Intravenous  PONV Risk Score and Plan: 1 and Propofol infusion and Treatment may vary due to age or medical condition  Airway Management Planned: Simple Face Mask  Additional Equipment:   Intra-op Plan:   Post-operative Plan: Extubation in OR  Informed Consent: I have reviewed the patients History and Physical, chart, labs and discussed the procedure including the risks, benefits and alternatives for the proposed anesthesia with the patient or authorized representative who has indicated his/her understanding and acceptance.     Dental advisory given  Plan Discussed with: CRNA and Surgeon  Anesthesia Plan Comments:         Anesthesia Quick Evaluation

## 2021-11-02 NOTE — Progress Notes (Signed)
Notified by nursing that patient had an episode of non-radiating substernal chest pain prior to NS bolus. I saw the patient in IR lab. He reported a temporary substernal chest pain lasting a minute or so. It has since resolved. It is non-reproducible on exam. EKG was obtained, which shows flipped t's in V3 (a change from the last EKG we have on record from 02/2021). Trp was negative. Will cycle another trp and check an echo. Monitor for now.

## 2021-11-02 NOTE — Consult Note (Addendum)
Urology Consult   Physician requesting consult: Laurena Spies, MD  Reason for consult: Hematochezia, hematuria  History of Present Illness: [name] is a 65 y.o. with a past medical history significant for diabetes, hypertension, hyperlipidemia, history of pulmonary embolism on Coumadin 10 mg daily since 2008.  He underwent a transrectal ultrasound-guided prostate biopsy on 10/26/2021 by Dr. Louis Meckel for an elevated PSA of 6.75.  Pathology resulted [id number] Gleason score 3+4 = 7 in 2/12 cores and Gleason score 3+3 = 6 and 3/12 cores with 5/12 cores positive (5-50%).   Patient states that since the biopsy, he had some mild hematuria that has improved.  He notes that his urine is mostly yellow however does have a slight pink tinge.  He does state that he initially did well with bowel movements however last night on 11/27, around 5 PM, experienced 7 episodes of bright red blood per rectum as well as blood in his stool.  He did note some clots.  He denies dizziness, chest pain, shortness of breath, weakness, abdominal pain, nausea emesis or fevers.    Past Medical History:  Diagnosis Date   Arthritis    BACK PAIN    CAD (coronary artery disease) 06/28/2017   CAD in native artery    a. moderate by cardiac CT 2016.   Chronic bilateral low back pain with bilateral sciatica 01/12/2019   Degenerative arthritis of right knee 07/15/2016   Injected in 07/15/2016 Orthovisc injection started 10/07/2016 DepoMedrol 40 inj on 04/11/17, 25 cc aspirated Orthovisc injection in 05/30/2017     DEGENERATIVE JOINT DISEASE, KNEES, BILATERAL    Depression    Diabetes ([place]) 10/01/2014   Diabetes mellitus    type II   DIVERTICULOSIS, COLON    DVT, lower extremity ([place])    right   History of kidney stones    Hyperlipidemia    Hypertension    HYPOGONADISM, MALE    HYPOTENSION, ORTHOSTATIC    Kidney stones    Long term (current) use of anticoagulants 08/17/2017   Long term current use of anticoagulant     Lumbosacral radiculopathy at S1 06/14/2012   Maxillary sinus cyst 12/11/2018   Obesity    OBESITY, MORBID 03/04/2008   Qualifier: Diagnosis of  By: Jenny Reichmann MD, Hunt Oris    OSA (obstructive sleep apnea) 09/11/2013   Pulmonary emboli ([facility])    Pulmonary embolism ([place]) 02/05/2011   RBBB 07/28/2017   Renal insufficiency    a. prior h/o, does not appear chronic.   Superficial phlebitis of arm 03/18/2016   Vitamin D deficiency 07/09/2020    Past Surgical History:  Procedure Laterality Date   ankle surgury  2001 bilateral   with bone spurs and torn tendon   COLONOSCOPY  03/18/2010   EDG  2008   chronic Duodenitis   GASTRIC BYPASS     HEEL SPUR SURGERY Bilateral 2005   knee surgury  2000 & 2003    cartilage damage   LEFT HEART CATH AND CORONARY ANGIOGRAPHY N/A 02/16/2021   Procedure: LEFT HEART CATH AND CORONARY ANGIOGRAPHY;  Surgeon: Sherren Mocha, MD;  Location: [facility];  Service: Cardiovascular;  Laterality: N/A;   LUMBAR LAMINECTOMY/DECOMPRESSION MICRODISCECTOMY  09/28/2012   Procedure: LUMBAR LAMINECTOMY/DECOMPRESSION MICRODISCECTOMY 2 LEVELS;  Surgeon: [name], MD;  Location: [facility];  Service: Orthopedics;  Laterality: Left;  Decompressive laminectomy L4-L5. Microdiscectomy L5-S1   Skin removal surgery     extra abdominal skin removed from his weight loss   TONSILLECTOMY AND ADENOIDECTOMY  age 63 or 52   TOTAL KNEE ARTHROPLASTY  01/10/2012   Procedure: TOTAL KNEE ARTHROPLASTY;  Surgeon: [name], MD;  Location: [facility];  Service: Orthopedics;  Laterality: Left;   TOTAL KNEE ARTHROPLASTY Right 11/18/2020   Procedure: TOTAL KNEE ARTHROPLASTY;  Surgeon: Paralee Cancel, MD;  Location: [facility];  Service: Orthopedics;  Laterality: Right;  70 mins   UPPER GASTROINTESTINAL ENDOSCOPY  03/18/2010    Medications:  Home meds:  No current facility-administered medications on file prior to encounter.   Current Outpatient Medications on File Prior to Encounter  Medication Sig  Dispense Refill   Artificial Tear Ointment (ARTIFICIAL TEARS) ointment Place 1 drop into both eyes every 4 (four) hours as needed (dry eyes).      atorvastatin (LIPITOR) 80 MG tablet Take 1 tablet (80 mg total) by mouth daily. 90 tablet 2   chlorthalidone (HYGROTON) 25 MG tablet TAKE 1 TABLET BY MOUTH EVERY DAY (Patient taking differently: Take 25 mg by mouth daily.) 90 tablet 3   Cholecalciferol (VITAMIN D3) 125 MCG (5000 UT) CAPS Take 5,000 Units by mouth daily.      Cyanocobalamin (VITAMIN B-12) 5000 MCG SUBL Place 5,000 mcg under the tongue 3 (three) times a week.     enoxaparin (LOVENOX) 120 MG/0.8ML injection Inject 0.8 mLs (120 mg total) into the skin every 12 (twelve) hours. As directed by anticoagulation clinic. 15.2 mL 0   escitalopram (LEXAPRO) 10 MG tablet TAKE 1 TABLET BY MOUTH EVERY DAY (Patient taking differently: Take 10 mg by mouth daily.) 90 tablet 2   gabapentin (NEURONTIN) 100 MG capsule Take 2 capsules (200 mg total) by mouth at bedtime. 60 capsule 0   glipiZIDE (GLUCOTROL XL) 2.5 MG 24 hr tablet TAKE 1 TABLET (2.5 MG TOTAL) BY MOUTH DAILY WITH BREAKFAST. 90 tablet 3   metFORMIN (GLUCOPHAGE) 500 MG tablet TAKE 4 TABLETS BY MOUTH DAILY WITH BREAKFAST (Patient taking differently: Take 2,000 mg by mouth daily with breakfast.) 360 tablet 3   Multiple Vitamin (MULTIVITAMIN WITH MINERALS) TABS tablet Take 1 tablet by mouth daily at 12 noon.     nitroGLYCERIN (NITROSTAT) 0.4 MG SL tablet Place 1 tablet (0.4 mg total) under the tongue every 5 (five) minutes as needed for chest pain. 90 tablet 3   pantoprazole (PROTONIX) 40 MG tablet TAKE 1 TABLET BY MOUTH EVERY DAY (Patient taking differently: 40 mg daily.) 90 tablet 3   warfarin (COUMADIN) 10 MG tablet Take 1 tablet daily or as directed by anticoagulation clinic 105 tablet 1   fluticasone (FLONASE) 50 MCG/ACT nasal spray Place 2 sprays into both nostrils daily as needed for allergies. (Patient not taking: Reported on 11/02/2021)        Scheduled Meds: Continuous Infusions: PRN Meds:.  Allergies:  Allergies  Allergen Reactions   Adhesive [Tape] Other (See Comments)    Painful, Please use "paper" tape   Other     NO BLOOD -JEHOVAH'S WITNESS-SIGNED REFUSAL BUT WOULD TAKE ALBUMIN    Family History  Problem Relation Age of Onset   Hypertension Mother    Prostate cancer Father    Diabetes Sister        died with DM 03/04/01, 2 more sisters with diabetes   Heart failure Sister    Diabetes Brother    Diabetes Sister    Diabetes Sister    Colon cancer Neg Hx    Colon polyps Neg Hx    Esophageal cancer Neg Hx    Rectal cancer Neg Hx  Stomach cancer Neg Hx     Social History:  reports that he quit smoking about 42 years ago. His smoking use included cigarettes. He has a 1.25 pack-year smoking history. He has never used smokeless tobacco. He reports current alcohol use of about 2.0 standard drinks per week. He reports that he does not use drugs.  ROS: A complete review of systems was performed.  All systems are negative except for pertinent findings as noted.  Physical Exam:  Vital signs in last 24 hours: Temp:  [98.1 F (36.7 C)] 98.1 F (36.7 C) (11/28 0247) Pulse Rate:  [51-62] 51 (11/28 0623) Resp:  [16-24] 24 (11/28 0623) BP: (122-135)/(78-86) 134/86 (11/28 0623) SpO2:  [97 %-99 %] 99 % (11/28 0623) Weight:  [113.4 kg] 113.4 kg (11/28 0247) Constitutional:  Alert and oriented, No acute distress Cardiovascular: Regular rate and rhythm Respiratory: Normal respiratory effort, Lungs clear bilaterally GI: Abdomen is soft, nontender, nondistended, no abdominal masses Genitourinary: No CVAT. Normal male phallus, testes are descended bilaterally and non-tender and without masses, scrotum is normal in appearance without lesions or masses, perineum is normal on inspection. Rectal: Normal sphincter tone, no rectal masses, prostate is non tender and without nodularity. Prostate size is estimated to be 40 cc.  There is bright red blood on the glove after removal. No masses or clots palpated. Neurologic: Grossly intact, no focal deficits Psychiatric: Normal mood and affect  Laboratory Data:  Recent Labs    11/02/21 0340  WBC 6.0  HGB 11.5*  HCT 37.7*  PLT 191    Recent Labs    11/02/21 0340  NA 139  K 4.9  CL 105  GLUCOSE 101*  BUN 31*  CALCIUM 8.9  CREATININE 1.29*     Results for orders placed or performed during the hospital encounter of 11/02/21 (from the past 24 hour(s))  CBC with Differential     Status: Abnormal   Collection Time: 11/02/21  3:40 AM  Result Value Ref Range   WBC 6.0 4.0 - 10.5 K/uL   RBC 5.22 4.22 - 5.81 MIL/uL   Hemoglobin 11.5 (L) 13.0 - 17.0 g/dL   HCT 37.7 (L) 39.0 - 52.0 %   MCV 72.2 (L) 80.0 - 100.0 fL   MCH 22.0 (L) 26.0 - 34.0 pg   MCHC 30.5 30.0 - 36.0 g/dL   RDW 16.9 (H) 11.5 - 15.5 %   Platelets 191 150 - 400 K/uL   nRBC 0.0 0.0 - 0.2 %   Neutrophils Relative % 49 %   Neutro Abs 2.9 1.7 - 7.7 K/uL   Lymphocytes Relative 38 %   Lymphs Abs 2.3 0.7 - 4.0 K/uL   Monocytes Relative 10 %   Monocytes Absolute 0.6 0.1 - 1.0 K/uL   Eosinophils Relative 3 %   Eosinophils Absolute 0.2 0.0 - 0.5 K/uL   Basophils Relative 0 %   Basophils Absolute 0.0 0.0 - 0.1 K/uL   WBC Morphology MORPHOLOGY UNREMARKABLE    Immature Granulocytes 0 %   Abs Immature Granulocytes 0.01 0.00 - 0.07 K/uL  Comprehensive metabolic panel     Status: Abnormal   Collection Time: 11/02/21  3:40 AM  Result Value Ref Range   Sodium 139 135 - 145 mmol/L   Potassium 4.9 3.5 - 5.1 mmol/L   Chloride 105 98 - 111 mmol/L   CO2 25 22 - 32 mmol/L   Glucose, Bld 101 (H) 70 - 99 mg/dL   BUN 31 (H) 8 - 23 mg/dL  Creatinine, Ser 1.29 (H) 0.61 - 1.24 mg/dL   Calcium 8.9 8.9 - 10.3 mg/dL   Total Protein 6.8 6.5 - 8.1 g/dL   Albumin 4.0 3.5 - 5.0 g/dL   AST 43 (H) 15 - 41 U/L   ALT 34 0 - 44 U/L   Alkaline Phosphatase 48 38 - 126 U/L   Total Bilirubin 0.5 0.3 - 1.2 mg/dL    GFR, Estimated >60 >60 mL/min   Anion gap 9 5 - 15  POC occult blood, ED Provider will collect     Status: Abnormal   Collection Time: 11/02/21  6:15 AM  Result Value Ref Range   Fecal Occult Bld POSITIVE (A) NEGATIVE  Urinalysis, Routine w reflex microscopic     Status: Abnormal   Collection Time: 11/02/21  6:15 AM  Result Value Ref Range   Color, Urine YELLOW YELLOW   APPearance CLOUDY (A) CLEAR   Specific Gravity, Urine 1.018 1.005 - 1.030   pH 5.0 5.0 - 8.0   Glucose, UA NEGATIVE NEGATIVE mg/dL   Hgb urine dipstick LARGE (A) NEGATIVE   Bilirubin Urine NEGATIVE NEGATIVE   Ketones, ur NEGATIVE NEGATIVE mg/dL   Protein, ur 30 (A) NEGATIVE mg/dL   Nitrite NEGATIVE NEGATIVE   Leukocytes,Ua NEGATIVE NEGATIVE   RBC / HPF >50 (H) 0 - 5 RBC/hpf   Bacteria, UA MANY (A) NONE SEEN   Squamous Epithelial / LPF 0-5 0 - 5  CBG monitoring, ED     Status: Abnormal   Collection Time: 11/02/21  6:36 AM  Result Value Ref Range   Glucose-Capillary 132 (H) 70 - 99 mg/dL   No results found for this or any previous visit (from the past 240 hour(s)).  Renal Function: Recent Labs    11/02/21 0340  CREATININE 1.29*   Estimated Creatinine Clearance: 74.4 mL/min (A) (by C-G formula based on SCr of 1.29 mg/dL (H)).  Radiologic Imaging: No results found.  I independently reviewed the above imaging studies.  Impression/Recommendation Hematochezia in setting of anticoagulation after recent TRUS prostate biopsy on 10/26/2021  Hematuria: in setting of anticoagulation after recent TRUS prostate biopsy on 10/26/2021 [id number] favorable intermediate risk Gleason score 3+4=7 prostate cancer  -Rectal exam did demonstrate a small amount of bright red blood on the glove however no active bleeding is seen.  I reassured patient that blood in the urine and stool is very common after transrectal ultrasound-guided prostate biopsy particularly in the setting of anticoagulation.   -Hemoglobin is appropriate at  11.5 and looks to be stable from 12 in 03/2021 -At this time, I do not think that he requires any invasive procedures.  However given the amount of hematochezia that he is described, I do recommend observation to ensure that he does not continue to bleed.  If he does continue to bleed, recommend GI consultation. -Hold coumadin given persistent bleeding -I did discuss pathology with patient and presented options.  I will have him follow-up with Dr. Louis Meckel to further discuss.  Matt R. Kairos Panetta MD 11/02/2021, 6:59 AM  [facility]  Pager: [phone]   CC: Laurena Spies, MD

## 2021-11-02 NOTE — Significant Event (Signed)
Rapid Response Event Note   Reason for Call :  Chest Pain  Initial Focused Assessment:  Pt resting in bed with cool cloth on forehead, eyes closed, and lights off. Pt oriented x4. Pt states he had chest discomfort described as pressure with a 4/10 intensity. Pt identifies the pressure location as medial chest substernal/ upper abdomen. Pt states he has never expereineced this discomfort before this hospitalization but the pain has come and gone with activity/movement since this admission. Pt states discomfort is accompanied by nausea, sweating, and feeling like he's going to pass out. Vital signs remained stable throughout rapid. Chest discomfort had resolved within minutes without any intervention.     Interventions:  EKG, troponin obtained. MD notified of event. Instructed bedside RN to call rapid response back with any further issues or concerns.  Plan of Care:  Pt remains in 1520 and to go to IR for procedure.   Event Summary:   MD Notified: Marylyn Ishihara, MD   Wray Kearns, RN

## 2021-11-02 NOTE — H&P (Signed)
Chief Complaint: Patient was seen in consultation today for image guided mesenteric/pelvic angiogram with possible embolization at the request of Nicoletta Ba, [place]  Referring Physician(s): Nicoletta Ba, [place]  Supervising Physician: Ruthann Cancer  Patient Status: [facility] - In-pt  History of Present Illness: [name] is a 65 y.o. male with PMH of CAD, DM type II, DVT, HLD, HTN, OSA, PE on anticoagulation and renal insufficiency.  To the emergency room on 11/02/2021 complaining of acute rectal bleeding that started previous evening.  Patient states he woke up from sleep and was incontinent of bright red blood from the rectum.  Patient had prostate biopsy on 10/26/2021 and had discontinued his Coumadin but was bridging with Lovenox.  For the past 3 days he has been on Coumadin and Lovenox.  Last dose of Coumadin was yesterday morning last dose of Lovenox was 8 PM last night.  INR 1.5 with pro time of 18.4.  Patient was referred by Dianna Limbo Ward, PA for image guided mesenteric/pelvic angiogram with possible embolization for GI bleed. Past Medical History:  Diagnosis Date   Arthritis    BACK PAIN    CAD (coronary artery disease) 06/28/2017   CAD in native artery    a. moderate by cardiac CT 2016.   Chronic bilateral low back pain with bilateral sciatica 01/12/2019   Degenerative arthritis of right knee 07/15/2016   Injected in 07/15/2016 Orthovisc injection started 10/07/2016 DepoMedrol 40 inj on 04/11/17, 25 cc aspirated Orthovisc injection in 05/30/2017     DEGENERATIVE JOINT DISEASE, KNEES, BILATERAL    Depression    Diabetes ([place]) 10/01/2014   Diabetes mellitus    type II   DIVERTICULOSIS, COLON    DVT, lower extremity ([place])    right   History of kidney stones    Hyperlipidemia    Hypertension    HYPOGONADISM, MALE    HYPOTENSION, ORTHOSTATIC    Kidney stones    Long term (current) use of anticoagulants 08/17/2017   Long term current use of anticoagulant    Lumbosacral  radiculopathy at S1 06/14/2012   Maxillary sinus cyst 12/11/2018   Obesity    OBESITY, MORBID 03/04/2008   Qualifier: Diagnosis of  By: Jenny Reichmann MD, Hunt Oris    OSA (obstructive sleep apnea) 09/11/2013   Pulmonary emboli (HCC)    Pulmonary embolism ([place]) 02/05/2011   RBBB 07/28/2017   Renal insufficiency    a. prior h/o, does not appear chronic.   Superficial phlebitis of arm 03/18/2016   Vitamin D deficiency 07/09/2020    Past Surgical History:  Procedure Laterality Date   ankle surgury  2001 bilateral   with bone spurs and torn tendon   COLONOSCOPY  03/18/2010   EDG  2008   chronic Duodenitis   GASTRIC BYPASS     HEEL SPUR SURGERY Bilateral 2005   knee surgury  2000 & 2003    cartilage damage   LEFT HEART CATH AND CORONARY ANGIOGRAPHY N/A 02/16/2021   Procedure: LEFT HEART CATH AND CORONARY ANGIOGRAPHY;  Surgeon: Sherren Mocha, MD;  Location: [place] [facility];  Service: Cardiovascular;  Laterality: N/A;   LUMBAR LAMINECTOMY/DECOMPRESSION MICRODISCECTOMY  09/28/2012   Procedure: LUMBAR LAMINECTOMY/DECOMPRESSION MICRODISCECTOMY 2 LEVELS;  Surgeon: [name], MD;  Location: [facility];  Service: Orthopedics;  Laterality: Left;  Decompressive laminectomy L4-L5. Microdiscectomy L5-S1   Skin removal surgery     extra abdominal skin removed from his weight loss   TONSILLECTOMY AND ADENOIDECTOMY  age 51 or 23  TOTAL KNEE ARTHROPLASTY  01/10/2012   Procedure: TOTAL KNEE ARTHROPLASTY;  Surgeon: [name], MD;  Location: [facility];  Service: Orthopedics;  Laterality: Left;   TOTAL KNEE ARTHROPLASTY Right 11/18/2020   Procedure: TOTAL KNEE ARTHROPLASTY;  Surgeon: Paralee Cancel, MD;  Location: [facility];  Service: Orthopedics;  Laterality: Right;  70 mins   UPPER GASTROINTESTINAL ENDOSCOPY  03/18/2010    Allergies: Adhesive [tape] and Other  Medications: Prior to Admission medications   Medication Sig Start Date End Date Taking? Authorizing Provider  Artificial Tear Ointment (ARTIFICIAL TEARS)  ointment Place 1 drop into both eyes every 4 (four) hours as needed (dry eyes).    Yes [provider]  atorvastatin (LIPITOR) 80 MG tablet Take 1 tablet (80 mg total) by mouth daily. 06/12/21  Yes Biagio Borg, MD  chlorthalidone (HYGROTON) 25 MG tablet TAKE 1 TABLET BY MOUTH EVERY DAY Patient taking differently: Take 25 mg by mouth daily. 10/20/21  Yes Biagio Borg, MD  Cholecalciferol (VITAMIN D3) 125 MCG (5000 UT) CAPS Take 5,000 Units by mouth daily.    Yes [provider]  Cyanocobalamin (VITAMIN B-12) 5000 MCG SUBL Place 5,000 mcg under the tongue 3 (three) times a week.   Yes [provider]  enoxaparin (LOVENOX) 120 MG/0.8ML injection Inject 0.8 mLs (120 mg total) into the skin every 12 (twelve) hours. As directed by anticoagulation clinic. 10/20/21  Yes Biagio Borg, MD  escitalopram (LEXAPRO) 10 MG tablet TAKE 1 TABLET BY MOUTH EVERY DAY Patient taking differently: Take 10 mg by mouth daily. 08/06/21  Yes Biagio Borg, MD  gabapentin (NEURONTIN) 100 MG capsule Take 2 capsules (200 mg total) by mouth at bedtime. 10/21/21  Yes Hulan Saas M, DO  glipiZIDE (GLUCOTROL XL) 2.5 MG 24 hr tablet TAKE 1 TABLET (2.5 MG TOTAL) BY MOUTH DAILY WITH BREAKFAST. 04/28/21  Yes Biagio Borg, MD  metFORMIN (GLUCOPHAGE) 500 MG tablet TAKE 4 TABLETS BY MOUTH DAILY WITH BREAKFAST Patient taking differently: Take 2,000 mg by mouth daily with breakfast. 06/02/21  Yes Biagio Borg, MD  Multiple Vitamin (MULTIVITAMIN WITH MINERALS) TABS tablet Take 1 tablet by mouth daily at 12 noon.   Yes [provider]  nitroGLYCERIN (NITROSTAT) 0.4 MG SL tablet Place 1 tablet (0.4 mg total) under the tongue every 5 (five) minutes as needed for chest pain. 07/30/15  Yes Dorothy Spark, MD  pantoprazole (PROTONIX) 40 MG tablet TAKE 1 TABLET BY MOUTH EVERY DAY Patient taking differently: 40 mg daily. 06/02/21  Yes Biagio Borg, MD  warfarin (COUMADIN) 10 MG tablet Take 1 tablet daily or  as directed by anticoagulation clinic 10/20/21  Yes Biagio Borg, MD  fluticasone [facility]) 50 MCG/ACT nasal spray Place 2 sprays into both nostrils daily as needed for allergies. Patient not taking: Reported on 11/02/2021 11/28/18   Rama, Venetia Maxon, MD     Family History  Problem Relation Age of Onset   Hypertension Mother    Prostate cancer Father    Diabetes Sister        died with DM 03/17/2001, 2 more sisters with diabetes   Heart failure Sister    Diabetes Brother    Diabetes Sister    Diabetes Sister    Colon cancer Neg Hx    Colon polyps Neg Hx    Esophageal cancer Neg Hx    Rectal cancer Neg Hx    Stomach cancer Neg Hx     Social History  Socioeconomic History   Marital status: Married    Spouse name: Not on file   Number of children: 2   Years of education: Not on file   Highest education level: High school graduate  Occupational History   Occupation: Retired    Fish farm manager: [employer]  Tobacco Use   Smoking status: Former    Packs/day: 0.25    Years: 5.00    Pack years: 1.25    Types: Cigarettes    Quit date: 12/06/1978    Years since quitting: 42.9   Smokeless tobacco: Never  Vaping Use   Vaping Use: Never used  Substance and Sexual Activity   Alcohol use: Yes    Alcohol/week: 2.0 standard drinks    Types: 2 Standard drinks or equivalent per week   Drug use: No   Sexual activity: Not Currently  Other Topics Concern   Not on file  Social History Narrative   Daily caffeine use one per day   Protein drinks    Children; a friend and sister that helps   Brother that is around [place])   Son and dtr in [place]   Lives w/ wife in [place]   Left handed     Retired from [employer]   Former smoker 2 alcoholic beverages daily no caffeine no drug use no other tobacco at this time   Social Determinants of Radio broadcast assistant Strain: Not on file  Food Insecurity: Not on file  Transportation Needs: Not on file  Physical Activity: Not on file   Stress: Not on file  Social Connections: Not on file     Review of Systems: A 12 point ROS discussed and pertinent positives are indicated in the HPI above.  All other systems are negative.  Review of Systems  Constitutional:  Negative for chills and fever.  HENT:  Negative for nosebleeds.   Respiratory:  Positive for shortness of breath. Negative for cough.   Cardiovascular:  Negative for chest pain and leg swelling.  Gastrointestinal:  Positive for anal bleeding, blood in stool and nausea. Negative for abdominal pain and vomiting.  Genitourinary:  Positive for hematuria.       Post prostate biopsy  Neurological:  Negative for dizziness, light-headedness and headaches.   Vital Signs: BP 96/72 (BP Location: Left Arm)   Pulse (!) 58   Temp (!) 97.5 F (36.4 C) (Oral)   Resp 18   Ht 6\' 2"  (1.88 m)   Wt 250 lb (113.4 kg)   SpO2 100%   BMI 32.10 kg/m   Physical Exam Constitutional:      Appearance: He is ill-appearing.  HENT:     Head: Normocephalic and atraumatic.     Mouth/Throat:     Mouth: Mucous membranes are dry.     Pharynx: Oropharynx is clear.  Cardiovascular:     Rate and Rhythm: Normal rate and regular rhythm.     Pulses: Normal pulses.     Heart sounds: Normal heart sounds. No murmur heard.   No friction rub. No gallop.  Pulmonary:     Effort: Pulmonary effort is normal. No respiratory distress.     Breath sounds: Normal breath sounds. No stridor. No wheezing, rhonchi or rales.  Abdominal:     General: Bowel sounds are normal. There is no distension.     Palpations: Abdomen is soft.     Tenderness: There is no abdominal tenderness. There is no guarding.  Musculoskeletal:     Right lower leg: No edema.  Left lower leg: No edema.  Skin:    General: Skin is warm and dry.  Neurological:     Mental Status: He is alert and oriented to person, place, and time.  Psychiatric:        Mood and Affect: Mood normal.        Behavior: Behavior normal.         Thought Content: Thought content normal.        Judgment: Judgment normal.    Imaging: DG Lumbar Spine 2-3 Views  Result Date: 10/22/2021 CLINICAL DATA:  Lumbar spine pain. EXAM: LUMBAR SPINE - 2-3 VIEW COMPARISON:  Lumbar spine x-ray 11/22/2017 FINDINGS: Alignment is anatomic. There is no evidence for acute fracture. There is moderate disc space narrowing at L4-L5 and L5-S1 which is similar to the prior study. There are atherosclerotic calcifications of the aorta. There is a surgical clip in the left abdomen. There is a large amount of stool in the distal colon and rectum. IMPRESSION: Stable moderate degenerative changes at L4-L5 and L5-S1. Large amount of stool in the distal colon and rectum. Electronically Signed   By: Ronney Asters M.D.   On: 10/22/2021 20:43   CT Angio Abd/Pel w/ and/or w/o  Result Date: 11/02/2021 CLINICAL DATA:  Acute lower GI bleeding, status post prostate biopsy last week. EXAM: CT ANGIOGRAPHY ABDOMEN AND PELVIS WITH CONTRAST AND WITHOUT CONTRAST TECHNIQUE: Multidetector CT imaging of the abdomen and pelvis was performed using the standard protocol during bolus administration of intravenous contrast. Multiplanar reconstructed images and MIPs were obtained and reviewed to evaluate the vascular anatomy. CONTRAST:  130mL OMNIPAQUE IOHEXOL 350 MG/ML SOLN COMPARISON:  10/08/2020 FINDINGS: VASCULAR Aorta: Minor aortic atherosclerosis. Negative for aneurysm, dissection or occlusive process. No retroperitoneal hemorrhage or hematoma. Celiac: Minor atherosclerotic origin but remains patent including its branches SMA: Minor atherosclerotic origin but remains patent including its branches Renals: Main renal arteries remain patent. Minor atherosclerotic changes. No accessory renal artery. IMA: Remains patent off the distal aorta anteriorly including its branches. Lower rectum active contrast extravasation on the arterial phase which progresses on the delayed portal venous phase imaging  compatible with acute active lower rectal GI bleeding. Inflow: Iliac atherosclerosis noted but no inflow disease or occlusion. Mild iliac tortuosity. Common, internal and external iliac arteries all remain patent. Proximal Outflow: Common femoral, proximal profunda femoral, proximal superficial femoral arteries all remain patent. Veins: No veno-occlusive process. Review of the MIP images confirms the above findings. NON-VASCULAR Lower chest: No acute abnormality. Hepatobiliary: No focal hepatic abnormality. Small left hepatic lobe noted. No biliary obstruction pattern dilatation. Patent portal and hepatic veins. Small gallstones noted. Common bile duct nondilated. Pancreas: Unremarkable. No pancreatic ductal dilatation or surrounding inflammatory changes. Spleen: Normal in size without focal abnormality. Adrenals/Urinary Tract: Adrenal glands are unremarkable. Kidneys are normal, without renal calculi, focal lesion, or hydronephrosis. Bladder is unremarkable. Stomach/Bowel: Postop changes from gastric bypass. Negative for bowel obstruction, significant dilatation, ileus, or free air. Appendix not visualized. No free fluid, fluid collection, abscess or ascites. Lymphatic: No bulky adenopathy. Reproductive: No other significant finding by CT. Other: No abdominal wall hernia or abnormality. No abdominopelvic ascites. Musculoskeletal: Degenerative changes throughout the spine. Multilevel lower lumbar facet arthropathy. No acute osseous finding. IMPRESSION: VASCULAR Positive CTA for acute lower rectal GI bleeding. These results were called by telephone at the time of interpretation on 11/02/2021 at 11:16 am to provider AMY ESTERWOOD , who verbally acknowledged these results. NON-VASCULAR Remote gastric bypass surgery Incidental cholelithiasis Electronically Signed  By: Eugenie Filler M.D.   On: 11/02/2021 11:21    Labs:  CBC: Recent Labs    12/12/20 1602 02/10/21 1205 03/10/21 0900 11/02/21 0340  WBC 10.7* 8.6 6.7  6.0  HGB 10.8* 13.0 12.0* 11.5*  HCT 34.5* 43.9 38.0* 37.7*  PLT 418.0* 285 257.0 191    COAGS: Recent Labs    11/18/20 1013 11/19/20 0318 09/15/21 0000 10/22/21 0000 10/28/21 0000 11/02/21 0638  INR  --    < > 1.7* 2.5 1.2* 1.5*  APTT 32  --   --   --   --   --    < > = values in this interval not displayed.    BMP: Recent Labs    11/11/20 0836 11/19/20 0318 12/12/20 1602 02/10/21 1205 03/10/21 0900 09/11/21 0812 11/02/21 0340  NA 138 135   < > 137 141 141 139  K 4.5 4.5   < > 4.7 4.4 4.6 4.9  CL 99 102   < > 100 103 105 105  CO2 29 23   < > 23 30 28 25   GLUCOSE 139* 202*   < > 107* 100* 94 101*  BUN 24* 30*   < > 28* 26* 30* 31*  CALCIUM 9.3 8.4*   < > 9.7 8.9  9.3 9.4  9.2 8.9  CREATININE 1.25* 1.31*   < > 1.30* 1.26 1.41 1.29*  GFRNONAA >60 >60  --   --   --   --  >60   < > = values in this interval not displayed.    LIVER FUNCTION TESTS: Recent Labs    12/12/20 1602 03/10/21 0900 09/11/21 0812 11/02/21 0340  BILITOT 0.8 0.5 0.7 0.5  AST 15 17 18  43*  ALT 19 23 22  34  ALKPHOS 83 65 63 48  PROT 7.4 6.6 6.6 6.8  ALBUMIN 4.2 4.2 4.2 4.0    TUMOR MARKERS: No results for input(s): AFPTM, CEA, CA199, CHROMGRNA in the last 8760 hours.  Assessment and Plan: History of CAD, DM type II, DVT, HLD, HTN, OSA, PE on anticoagulation and renal insufficiency.  To the emergency room on 11/02/2021 complaining of acute rectal bleeding that started previous evening.  Patient states he woke up from sleep and was incontinent of bright red blood from the rectum.  Patient had prostate biopsy on 10/26/2021 and had discontinued his Coumadin but was bridging with Lovenox.  For the past 3 days he has been on Coumadin and Lovenox.  Last dose of Coumadin was yesterday morning last dose of Lovenox was 8 PM last night.  INR 1.5 with pro time of 18.4.  Patient was referred by Dianna Limbo Ward, PA for image guided mesenteric/pelvic angiogram with possible embolization for GI  bleed.  Patient resting in bed with cool washcloth over face.  His wife is at bedside.  He is alert and oriented x3, calm and pleasant.  He is in no distress. Has received vitamin K infusion per order. Patient reports his last Coumadin and Lovenox were yesterday.  He states he is n.p.o. since yesterday as well.  Patient's hemoglobin today is 11.5.  Patient's INR 1.5 Patient's vital signs are stable  The Risks and benefits of which guided mesenteric/pelvic angiogram with possible embolization were discussed with the patient and his wife including, but not limited to bleeding, infection, vascular injury, post operative pain, or contrast induced renal failure.  This procedure involves the use of X-rays and because of the nature of the planned procedure, it  is possible that we will have prolonged use of X-ray fluoroscopy.  Potential radiation risks to you include (but are not limited to) the following: - A slightly elevated risk for cancer several years later in life. This risk is typically less than 0.5% percent. This risk is low in comparison to the normal incidence of human cancer, which is 33% for women and 50% for men according to the [place]. - Radiation induced injury can include skin redness, resembling a rash, tissue breakdown / ulcers and hair loss (which can be temporary or permanent).   The likelihood of either of these occurring depends on the difficulty of the procedure and whether you are sensitive to radiation due to previous procedures, disease, or genetic conditions.   IF your procedure requires a prolonged use of radiation, you will be notified and given written instructions for further action.  It is your responsibility to monitor the irradiated area for the 2 weeks following the procedure and to notify your physician if you are concerned that you have suffered a radiation induced injury.    All of the patient's and his wife's questions were answered, patient is  agreeable to proceed. Consent signed and in chart.   Thank you for this interesting consult.  I greatly enjoyed meeting Latavious T Dutch and look forward to participating in their care.  A copy of this report was sent to the requesting provider on this date.  Electronically Signed: Tyson Alias, NP 11/02/2021, 12:22 PM   I spent a total of 30 minutes in face to face in clinical consultation, greater than 50% of which was counseling/coordinating care for image guided mesenteric/pelvic angiogram with possible embolization for GI bleed.

## 2021-11-03 ENCOUNTER — Inpatient Hospital Stay (HOSPITAL_COMMUNITY): Payer: HMO | Admitting: Anesthesiology

## 2021-11-03 ENCOUNTER — Encounter (HOSPITAL_COMMUNITY): Payer: Self-pay | Admitting: Internal Medicine

## 2021-11-03 ENCOUNTER — Encounter (HOSPITAL_COMMUNITY)
Admission: EM | Disposition: A | Discharge status: Home / Self Care | Payer: Self-pay | Source: Home / Self Care | Attending: Internal Medicine

## 2021-11-03 ENCOUNTER — Inpatient Hospital Stay (HOSPITAL_COMMUNITY): Payer: HMO

## 2021-11-03 ENCOUNTER — Telehealth: Payer: Self-pay | Admitting: Internal Medicine

## 2021-11-03 ENCOUNTER — Ambulatory Visit: Payer: HMO

## 2021-11-03 DIAGNOSIS — R079 Chest pain, unspecified: Secondary | ICD-10-CM | POA: Diagnosis not present

## 2021-11-03 DIAGNOSIS — F32A Depression, unspecified: Secondary | ICD-10-CM | POA: Diagnosis present

## 2021-11-03 DIAGNOSIS — K91841 Postprocedural hemorrhage and hematoma of a digestive system organ or structure following other procedure: Secondary | ICD-10-CM | POA: Diagnosis present

## 2021-11-03 DIAGNOSIS — Z833 Family history of diabetes mellitus: Secondary | ICD-10-CM | POA: Diagnosis not present

## 2021-11-03 DIAGNOSIS — K625 Hemorrhage of anus and rectum: Secondary | ICD-10-CM | POA: Diagnosis not present

## 2021-11-03 DIAGNOSIS — E1169 Type 2 diabetes mellitus with other specified complication: Secondary | ICD-10-CM

## 2021-11-03 DIAGNOSIS — E782 Mixed hyperlipidemia: Secondary | ICD-10-CM

## 2021-11-03 DIAGNOSIS — Z96651 Presence of right artificial knee joint: Secondary | ICD-10-CM | POA: Diagnosis present

## 2021-11-03 DIAGNOSIS — Z7984 Long term (current) use of oral hypoglycemic drugs: Secondary | ICD-10-CM | POA: Diagnosis not present

## 2021-11-03 DIAGNOSIS — E119 Type 2 diabetes mellitus without complications: Secondary | ICD-10-CM | POA: Diagnosis present

## 2021-11-03 DIAGNOSIS — Z86711 Personal history of pulmonary embolism: Secondary | ICD-10-CM

## 2021-11-03 DIAGNOSIS — Z7901 Long term (current) use of anticoagulants: Secondary | ICD-10-CM | POA: Diagnosis not present

## 2021-11-03 DIAGNOSIS — K922 Gastrointestinal hemorrhage, unspecified: Secondary | ICD-10-CM | POA: Diagnosis not present

## 2021-11-03 DIAGNOSIS — Z9884 Bariatric surgery status: Secondary | ICD-10-CM | POA: Diagnosis not present

## 2021-11-03 DIAGNOSIS — I82612 Acute embolism and thrombosis of superficial veins of left upper extremity: Secondary | ICD-10-CM | POA: Diagnosis present

## 2021-11-03 DIAGNOSIS — D62 Acute posthemorrhagic anemia: Secondary | ICD-10-CM

## 2021-11-03 DIAGNOSIS — Z86718 Personal history of other venous thrombosis and embolism: Secondary | ICD-10-CM | POA: Diagnosis not present

## 2021-11-03 DIAGNOSIS — Z8042 Family history of malignant neoplasm of prostate: Secondary | ICD-10-CM | POA: Diagnosis not present

## 2021-11-03 DIAGNOSIS — K921 Melena: Secondary | ICD-10-CM | POA: Diagnosis present

## 2021-11-03 DIAGNOSIS — Y848 Other medical procedures as the cause of abnormal reaction of the patient, or of later complication, without mention of misadventure at the time of the procedure: Secondary | ICD-10-CM | POA: Diagnosis present

## 2021-11-03 DIAGNOSIS — E669 Obesity, unspecified: Secondary | ICD-10-CM

## 2021-11-03 DIAGNOSIS — Z87891 Personal history of nicotine dependence: Secondary | ICD-10-CM | POA: Diagnosis not present

## 2021-11-03 DIAGNOSIS — D6832 Hemorrhagic disorder due to extrinsic circulating anticoagulants: Secondary | ICD-10-CM | POA: Diagnosis present

## 2021-11-03 DIAGNOSIS — I10 Essential (primary) hypertension: Secondary | ICD-10-CM

## 2021-11-03 DIAGNOSIS — K219 Gastro-esophageal reflux disease without esophagitis: Secondary | ICD-10-CM

## 2021-11-03 DIAGNOSIS — C61 Malignant neoplasm of prostate: Secondary | ICD-10-CM | POA: Diagnosis present

## 2021-11-03 DIAGNOSIS — M79602 Pain in left arm: Secondary | ICD-10-CM | POA: Diagnosis not present

## 2021-11-03 DIAGNOSIS — E538 Deficiency of other specified B group vitamins: Secondary | ICD-10-CM | POA: Diagnosis not present

## 2021-11-03 DIAGNOSIS — Z20822 Contact with and (suspected) exposure to covid-19: Secondary | ICD-10-CM | POA: Diagnosis present

## 2021-11-03 DIAGNOSIS — R310 Gross hematuria: Secondary | ICD-10-CM

## 2021-11-03 DIAGNOSIS — N179 Acute kidney failure, unspecified: Secondary | ICD-10-CM | POA: Diagnosis present

## 2021-11-03 DIAGNOSIS — E559 Vitamin D deficiency, unspecified: Secondary | ICD-10-CM | POA: Diagnosis present

## 2021-11-03 DIAGNOSIS — I2510 Atherosclerotic heart disease of native coronary artery without angina pectoris: Secondary | ICD-10-CM | POA: Diagnosis present

## 2021-11-03 DIAGNOSIS — E785 Hyperlipidemia, unspecified: Secondary | ICD-10-CM | POA: Diagnosis present

## 2021-11-03 DIAGNOSIS — R935 Abnormal findings on diagnostic imaging of other abdominal regions, including retroperitoneum: Secondary | ICD-10-CM | POA: Diagnosis not present

## 2021-11-03 DIAGNOSIS — M7989 Other specified soft tissue disorders: Secondary | ICD-10-CM | POA: Diagnosis not present

## 2021-11-03 HISTORY — PX: FLEXIBLE SIGMOIDOSCOPY: SHX5431

## 2021-11-03 LAB — ECHOCARDIOGRAM COMPLETE
AR max vel: 3.96 cm2
AV Area VTI: 4.49 cm2
AV Area mean vel: 4.21 cm2
AV Mean grad: 10 mmHg
AV Peak grad: 17.5 mmHg
Ao pk vel: 2.09 m/s
Area-P 1/2: 3.36 cm2
Calc EF: 65.5 %
Height: 74 in
S' Lateral: 2.5 cm
Single Plane A2C EF: 66.6 %
Single Plane A4C EF: 64.1 %
Weight: 4000 oz

## 2021-11-03 LAB — CBC
HCT: 22.2 % — ABNORMAL LOW (ref 39.0–52.0)
Hemoglobin: 6.8 g/dL — CL (ref 13.0–17.0)
MCH: 22.4 pg — ABNORMAL LOW (ref 26.0–34.0)
MCHC: 30.6 g/dL (ref 30.0–36.0)
MCV: 73.3 fL — ABNORMAL LOW (ref 80.0–100.0)
Platelets: 146 10*3/uL — ABNORMAL LOW (ref 150–400)
RBC: 3.03 MIL/uL — ABNORMAL LOW (ref 4.22–5.81)
RDW: 16.6 % — ABNORMAL HIGH (ref 11.5–15.5)
WBC: 7.8 10*3/uL (ref 4.0–10.5)
nRBC: 0 % (ref 0.0–0.2)

## 2021-11-03 LAB — GLUCOSE, CAPILLARY
Glucose-Capillary: 142 mg/dL — ABNORMAL HIGH (ref 70–99)
Glucose-Capillary: 153 mg/dL — ABNORMAL HIGH (ref 70–99)
Glucose-Capillary: 160 mg/dL — ABNORMAL HIGH (ref 70–99)
Glucose-Capillary: 189 mg/dL — ABNORMAL HIGH (ref 70–99)

## 2021-11-03 LAB — COMPREHENSIVE METABOLIC PANEL
ALT: 27 U/L (ref 0–44)
AST: 28 U/L (ref 15–41)
Albumin: 3.1 g/dL — ABNORMAL LOW (ref 3.5–5.0)
Alkaline Phosphatase: 37 U/L — ABNORMAL LOW (ref 38–126)
Anion gap: 6 (ref 5–15)
BUN: 36 mg/dL — ABNORMAL HIGH (ref 8–23)
CO2: 21 mmol/L — ABNORMAL LOW (ref 22–32)
Calcium: 8.1 mg/dL — ABNORMAL LOW (ref 8.9–10.3)
Chloride: 107 mmol/L (ref 98–111)
Creatinine, Ser: 1.67 mg/dL — ABNORMAL HIGH (ref 0.61–1.24)
GFR, Estimated: 45 mL/min — ABNORMAL LOW (ref >60)
Glucose, Bld: 168 mg/dL — ABNORMAL HIGH (ref 70–99)
Potassium: 4.5 mmol/L (ref 3.5–5.1)
Sodium: 134 mmol/L — ABNORMAL LOW (ref 135–145)
Total Bilirubin: 0.5 mg/dL (ref 0.3–1.2)
Total Protein: 4.9 g/dL — ABNORMAL LOW (ref 6.5–8.1)

## 2021-11-03 LAB — PROTIME-INR
INR: 1.2 (ref 0.8–1.2)
Prothrombin Time: 15.6 seconds — ABNORMAL HIGH (ref 11.4–15.2)

## 2021-11-03 LAB — HEMOGLOBIN AND HEMATOCRIT, BLOOD
HCT: 19.5 % — ABNORMAL LOW (ref 39.0–52.0)
HCT: 16.9 % — ABNORMAL LOW (ref 39.0–52.0)
Hemoglobin: 6 g/dL — CL (ref 13.0–17.0)
Hemoglobin: 5.2 g/dL — CL (ref 13.0–17.0)

## 2021-11-03 LAB — FERRITIN: Ferritin: 19 ng/mL — ABNORMAL LOW (ref 24–336)

## 2021-11-03 LAB — SODIUM, URINE, RANDOM: Sodium, Ur: 67 mmol/L

## 2021-11-03 LAB — CREATININE, URINE, RANDOM: Creatinine, Urine: 119.64 mg/dL

## 2021-11-03 SURGERY — SIGMOIDOSCOPY, FLEXIBLE
Anesthesia: Monitor Anesthesia Care

## 2021-11-03 MED ORDER — LACTATED RINGERS IV SOLN: INTRAVENOUS | Status: DC | PRN

## 2021-11-03 MED ORDER — EPHEDRINE SULFATE-NACL 50-0.9 MG/10ML-% IV SOSY
PREFILLED_SYRINGE | INTRAVENOUS | Status: DC | PRN
  Administered 2021-11-03: 15 mg via INTRAVENOUS
  Administered 2021-11-03: 10 mg via INTRAVENOUS

## 2021-11-03 MED ORDER — VITAMIN B-12 1000 MCG PO TABS
ORAL_TABLET | ORAL | Status: AC
  Administered 2021-11-04: 1000 ug via ORAL
  Filled 2021-11-03: qty 1

## 2021-11-03 MED ORDER — INSULIN ASPART 100 UNIT/ML IJ SOLN
0.0000 [IU] | Freq: Four times a day (QID) | INTRAMUSCULAR | Status: DC
  Administered 2021-11-03 – 2021-11-07 (×10): 1 [IU] via SUBCUTANEOUS
  Administered 2021-11-07: 2 [IU] via SUBCUTANEOUS
  Administered 2021-11-07 (×2): 1 [IU] via SUBCUTANEOUS

## 2021-11-03 MED ORDER — INSULIN ASPART 100 UNIT/ML IJ SOLN
0.0000 [IU] | Freq: Four times a day (QID) | INTRAMUSCULAR | Status: DC
  Administered 2021-11-03: 2 [IU] via SUBCUTANEOUS

## 2021-11-03 MED ORDER — PROPOFOL 10 MG/ML IV BOLUS
INTRAVENOUS | Status: DC | PRN
  Administered 2021-11-03 (×2): 40 mg via INTRAVENOUS

## 2021-11-03 MED ORDER — ONDANSETRON HCL 4 MG/2ML IJ SOLN: 4.0000 mg | Freq: Once | INTRAMUSCULAR | Status: DC | PRN

## 2021-11-03 MED ORDER — PHENYLEPHRINE HCL-NACL 20-0.9 MG/250ML-% IV SOLN
INTRAVENOUS | Status: DC | PRN
  Administered 2021-11-03: 25 ug/min via INTRAVENOUS

## 2021-11-03 MED ORDER — SODIUM CHLORIDE 0.9 % IV BOLUS
1000.0000 mL | Freq: Once | INTRAVENOUS | Status: AC
  Administered 2021-11-03: 1000 mL via INTRAVENOUS

## 2021-11-03 MED ORDER — LACTATED RINGERS IV SOLN: INTRAVENOUS | Status: DC

## 2021-11-03 MED ORDER — ALBUMIN HUMAN 5 % IV SOLN: INTRAVENOUS | Status: DC | PRN

## 2021-11-03 MED ORDER — SODIUM CHLORIDE 0.9 % IV SOLN
125.0000 mg | Freq: Every day | INTRAVENOUS | Status: AC
  Administered 2021-11-03 – 2021-11-04 (×2): 125 mg via INTRAVENOUS
  Filled 2021-11-03 (×2): qty 10

## 2021-11-03 MED ORDER — FOLIC ACID 1 MG PO TABS
1.0000 mg | ORAL_TABLET | Freq: Every day | ORAL | Status: DC
  Administered 2021-11-03 – 2021-11-08 (×5): 1 mg via ORAL
  Filled 2021-11-03 (×5): qty 1

## 2021-11-03 MED ORDER — SODIUM CHLORIDE 0.9 % IV SOLN
125.0000 mg | Freq: Once | INTRAVENOUS | Status: DC
  Filled 2021-11-03: qty 10

## 2021-11-03 MED ORDER — PHENYLEPHRINE 40 MCG/ML (10ML) SYRINGE FOR IV PUSH (FOR BLOOD PRESSURE SUPPORT)
PREFILLED_SYRINGE | INTRAVENOUS | Status: DC | PRN
  Administered 2021-11-03: 160 ug via INTRAVENOUS

## 2021-11-03 MED ORDER — SODIUM CHLORIDE 0.9 % IV BOLUS: 500.0000 mL | Freq: Once | INTRAVENOUS | Status: DC

## 2021-11-03 MED ORDER — PROPOFOL 500 MG/50ML IV EMUL
INTRAVENOUS | Status: DC | PRN
  Administered 2021-11-03: 75 ug/kg/min via INTRAVENOUS

## 2021-11-03 MED ORDER — FOLIC ACID 1 MG PO TABS
ORAL_TABLET | ORAL | Status: AC
  Administered 2021-11-04: 1 mg via ORAL
  Filled 2021-11-03: qty 1

## 2021-11-03 MED ORDER — LIDOCAINE HCL (CARDIAC) PF 100 MG/5ML IV SOSY
PREFILLED_SYRINGE | INTRAVENOUS | Status: DC | PRN
  Administered 2021-11-03: 40 mg via INTRATRACHEAL

## 2021-11-03 MED ORDER — INSULIN ASPART 100 UNIT/ML IJ SOLN: 0.0000 [IU] | Freq: Three times a day (TID) | INTRAMUSCULAR | Status: DC

## 2021-11-03 MED ORDER — VITAMIN B-12 1000 MCG PO TABS
1000.0000 ug | ORAL_TABLET | Freq: Every day | ORAL | Status: DC
  Administered 2021-11-03 – 2021-11-08 (×5): 1000 ug via ORAL
  Filled 2021-11-03 (×5): qty 1

## 2021-11-03 MED ORDER — FENTANYL CITRATE (PF) 100 MCG/2ML IJ SOLN: 25.0000 ug | INTRAMUSCULAR | Status: DC | PRN

## 2021-11-03 NOTE — Transfer of Care (Signed)
Immediate Anesthesia Transfer of Care Note  Patient: [name]  Procedure(s) Performed: FLEXIBLE SIGMOIDOSCOPY  Patient Location: PACU  Anesthesia Type:MAC  Level of Consciousness: awake and alert   Airway & Oxygen Therapy: Patient Spontanous Breathing and Patient connected to face mask oxygen  Post-op Assessment: Report given to RN and Post -op Vital signs reviewed and stable  Post vital signs: Reviewed and stable  Last Vitals:  Vitals Value Taken Time  BP 103/43 11/03/21 2018  Temp    Pulse 69 11/03/21 2020  Resp 17 11/03/21 2020  SpO2 97 % 11/03/21 2020  Vitals shown include unvalidated device data.  Last Pain:  Vitals:   11/03/21 1917  TempSrc: Oral  PainSc: 0-No pain      Patients Stated Pain Goal: 2 (15/72/62 0355)  Complications: No notable events documented.

## 2021-11-03 NOTE — Progress Notes (Signed)
Date and time results received: 11/03/21, 0605  Test: Hgb Critical Value: 6.9  Name of Provider Notified: Clarene Essex  Orders Received? Or Actions Taken?:  Pt is a Jehovah Witness

## 2021-11-03 NOTE — Telephone Encounter (Signed)
Patient's spouse [name] requesting a call back  Phone [phone]

## 2021-11-03 NOTE — H&P (View-Only) (Signed)
Our service was called urgently by internal medicine at approximately 5 PM today for recurrent lower GI bleeding in this patient.  He had a prostate biopsy last week and was admitted early yesterday with profuse bright red blood per rectum and acute blood loss anemia. CT angiogram confirmed bleeding from the rectum, patient went to IR and had Gelfoam embolization of the right inferior rectal artery (no active extravasation was visualized).  There was no recurrent bleeding until late this afternoon, approximately 4 PM according to the patient's nurse.  He passed a large amount of fresh clotted blood, but has remained hemodynamically stable with normal blood pressure and heart rate throughout (including vital signs taken just now the time of my evaluation)  He currently denies chest pain, dyspnea, abdominal or rectal pain.  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  Hemoglobin late yesterday was 6.8, 6.1 earlier today, and a recheck this evening 6.8.  On exam his cardiac rhythm is regular without appreciable murmur, lungs are clear bilaterally with good air entry and no respiratory distress, abdomen is soft nondistended nontender  He has recurrent bleeding from his prostate biopsy site, perhaps a vessel within the rectal wall or the prostate itself.  Therapeutic options appear limited at this point.  It is not clear that the source will be visualized or amenable to endoscopic intervention, but a sigmoidoscopy is warranted to evaluate.  If source unable to be localized and is not amenable to endoscopic therapy, and patient would need repeat evaluation by interventional radiology.  Sigmoidoscopy was discussed in detail along with risks and benefits and he was agreeable.  The benefits and risks of the planned procedure were described in detail with the patient or (when appropriate) their health care proxy.  Risks were outlined as including, but not limited to,  bleeding, infection, perforation, adverse medication reaction leading to cardiac or pulmonary decompensation, pancreatitis (if ERCP).  The limitation of incomplete mucosal visualization was also discussed.  No guarantees or warranties were given.  He was also aware that his severe anemia without transfusion increases the sedation related risks of this procedure including, but not limited to, end-organ damage, stroke, heart attack and death.  Consent obtained in the presence of his nurse and our endoscopy nurse.  His wife [name] and a close friend were at the bedside throughout my visit, all questions were answered to their satisfaction.  Our service also contacted the interventional radiology physician on-call ( [place]) to discuss the case and make them aware of this patient's situation.  They concurred with sigmoidoscopy, after which I will communicate findings to them.  Wilfrid Lund, MD [facility]

## 2021-11-03 NOTE — Telephone Encounter (Signed)
Contacted [name]'s wife, [name], who was with [name] at the hospital. Spoke with [name] and [name] and they wanted to know if this nurse knew what they should do going forward. Advised [name] is in good hands in the hospital and to consult with the hospital physicians for progress forward. [name] reports fatigue and continued decrease in hemoglobin. He reports they do think they stopped the bleeding in the colon form the prostate biopsy but they think he has bleeding somewhere else also but are not sure where. He was to have another procedure, endoscopy the [name] thinks, this morning but it has been cancelled due to low hemoglobin. [name] said he will contact the coumadin clinic when he is d/c for instructions at that time. Advised if anything is needed to contact this office. [name] and his wife verbalized understanding.

## 2021-11-03 NOTE — Progress Notes (Signed)
PROGRESS NOTE    RYLAN BERNARD  OQH:[id number] DOB: Dec 07, 1955 DOA: 11/02/2021 PCP: Biagio Borg, MD    Chief Complaint  Patient presents with   Rectal Bleeding    Brief Narrative:  Patient is a pleasant 65 year old gentleman(Jeh[employer]'s Witness) history of type 2 diabetes, hypertension, PE/dvt (2007) on anticoagulation with Coumadin, recent transrectal ultrasound-guided prostate biopsy 10/26/2021 due to elevated PSA presenting to the ED with a large amount of bright red blood per rectum, some lightheadedness and dizziness, some hematuria.  Patient noted to have recent being on Lovenox bridge with Coumadin postprocedure.  Patient denied any NSAID use.  Patient had continued bloody bowel movements.  Patient seen in the ED, GI and urology consulted.  Patient underwent CT angiogram abdomen and pelvis concerning for lower rectal bleed.  IR consulted and patient underwent mesenteric/pelvic angiogram with embolization.  Hemoglobin trended down to 6.0 11/03/2021.  Hematology oncology consulted as well.   Assessment & Plan:   Principal Problem:   GIB (gastrointestinal bleeding) Active Problems:   Acute blood loss anemia   Hyperlipidemia   Depression   Essential hypertension   Pulmonary embolism (HCC)   Gross hematuria   Esophageal reflux   History of pulmonary embolus (PE)   Diabetes mellitus type 2 in obese (HCC)   Chest pain   #1 acute lower GI bleed/acute blood loss anemia -Secondary to lower rectal bleed as noted on CT angiogram abdomen and pelvis. -Hemoglobin 11.5 on admission down to 6.8 early on this morning and down to 6.0 around midday. -Patient is status post mesenteric angiogram with IR embolization of distal right inferior rectal artery 11/02/2021 due to major hemorrhage. -Patient noted to have few clots on pad during the night no bowel movement early on this morning however this afternoon noted to have clots and maroon-colored stool per RN. -Patient on IV fluids, systolic  blood pressure stable in the 120s, pulse in the 70s. 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 -Hematology/oncology consulted and recommending supportive care with IV iron, vitamin T46, folic acid. -IV iron ordered. -IV PPI -Per hematology/oncology due to recent diagnosis of prostate cancer EPO is a relative contraindication. -Hematology/oncology also recommending discontinuation of anticoagulation with Coumadin as patient's provoked PE/DVT almost 15 years ago with no recurrent PE or DVT. -Due to concern for ongoing bleed repeat H&H stat now and every 8 hours. -GI alerted of concern for ongoing bleed this afternoon and patient currently made NPO. -GI in the process of informing IR. -IV fluids, supportive care.  2.  History of bilateral PE -Patient noted to have a provoked PE/DVT in 2007 on anticoagulation since then with no recurrent PE or DVT. -In light of ongoing acute lower GI bleed hematology recommending discontinuation of anticoagulation with Coumadin.  3.  New diagnosis of intermediate risk prostate cancer/gross hematuria -Seen by urology during this hospitalization and recommending outpatient follow-up to discuss treatment options.  4.  Chest pain -Patient noted to have chest pain yesterday which has since resolved. -Chest pain likely secondary to problem #1 with anemia. -2D echo ordered with EF of 65 to 70%, NWMA, grade 1 diastolic dysfunction. -No further cardiac work-up needed at this time.  5.  Diabetes mellitus type 2 -Hemoglobin A1c 7.2 (09/11/2021) -CBG 153. -Hold home dose oral hypoglycemic agents -SSI.  6. hypertension -Patient with soft/borderline blood pressure. -Discontinue Hygroton.  7.  Hyperlipidemia -Continue statin.  8.  Obesity -Lifestyle modification -Outpatient follow-up.  9.  Depression -Continue antidepressant  medications.  10.  GERD -PPI.  DVT prophylaxis: SCDs Code Status: Full Family  Communication: Updated patient, wife, family at bedside. Disposition:   Status is: Inpatient  Remains inpatient appropriate because: Severity of illness       Consultants:  Gastroenterology: Dr. Henrene Pastor 11/02/2021 Hematology/oncology: Dr. Burr Medico 11/03/2021 Urology: Dr. Abner Greenspan 11/02/2021  Procedures:  CT angiogram abdomen and pelvis 11/02/2021 Ultrasound-guided vascular access of right common femoral artery/abdominal aortogram/selective catheterization angiography of the inferior mesenteric artery, superior rectal artery, bilateral internal iliac arteries, bilateral inferior rectal arteries, Gelfoam embolization of distal right inferior rectal artery per IR, Dr.Suttle.  11/02/2021  Antimicrobials:  None   Subjective: Laying in bed.  Family at bedside.  Denies any chest pain.  No shortness of breath.  Stated he had some bleeding last night but not early this morning.  Patient denies any further hematuria.  It was noted per RN this evening around 445p patient with some dark clots noted.  Went and assessed patient patient denies any chest pain, no shortness of breath, no lightheadedness.  Patient feels it is old blood.  Patient not tachycardic heart rates in the 70s  Objective: Vitals:   11/03/21 0900 11/03/21 1211 11/03/21 1211 11/03/21 1702  BP: 111/62 (!) 113/57  120/66  Pulse: 78 93 82 73  Resp: 16 18    Temp: 98.9 F (37.2 C)  98.6 F (37 C)   TempSrc: Oral  Oral   SpO2: 100% 100% 100%   Weight:      Height:        Intake/Output Summary (Last 24 hours) at 11/03/2021 1726 Last data filed at 11/02/2021 2354 Gross per 24 hour  Intake --  Output 525 ml  Net -525 ml   Filed Weights   11/02/21 0247  Weight: 113.4 kg    Examination:  General exam: Appears calm and comfortable  Respiratory system: Clear to auscultation. Respiratory effort normal. Cardiovascular system: S1 & S2 heard, RRR. No JVD, murmurs, rubs, gallops or clicks. No pedal edema. Gastrointestinal system:  Abdomen is nondistended, soft and nontender. No organomegaly or masses felt. Normal bowel sounds heard. Central nervous system: Alert and oriented. No focal neurological deficits. Extremities: Symmetric 5 x 5 power. Skin: No rashes, lesions or ulcers Psychiatry: Judgement and insight appear normal. Mood & affect appropriate.     Data Reviewed: I have personally reviewed following labs and imaging studies  CBC: Recent Labs  Lab 11/02/21 0340 11/02/21 1224 11/02/21 1712 11/02/21 2215 11/03/21 0455 11/03/21 1142  WBC 6.0  --   --   --  7.8  --   NEUTROABS 2.9  --   --   --   --   --   HGB 11.5* 9.5* 8.0* 7.5* 6.8* 6.0*  HCT 37.7* 30.9* 26.0* 24.0* 22.2* 19.5*  MCV 72.2*  --   --   --  73.3*  --   PLT 191  --   --   --  146*  --     Basic Metabolic Panel: Recent Labs  Lab 11/02/21 0340 11/03/21 0455  NA 139 134*  K 4.9 4.5  CL 105 107  CO2 25 21*  GLUCOSE 101* 168*  BUN 31* 36*  CREATININE 1.29* 1.67*  CALCIUM 8.9 8.1*    GFR: Estimated Creatinine Clearance: 57.5 mL/min (A) (by C-G formula based on SCr of 1.67 mg/dL (H)).  Liver Function Tests: Recent Labs  Lab 11/02/21 0340 11/03/21 0455  AST 43* 28  ALT 34 27  ALKPHOS 48 37*  BILITOT 0.5 0.5  PROT 6.8 4.9*  ALBUMIN 4.0 3.1*    CBG: Recent Labs  Lab 11/02/21 0636 11/02/21 1202 11/02/21 2150 11/03/21 1205  GLUCAP 132* 197* 207* 153*     Recent Results (from the past 240 hour(s))  Resp Panel by RT-PCR (Flu A&B, Covid) Nasopharyngeal Swab     Status: None   Collection Time: 11/02/21  7:06 AM   Specimen: Nasopharyngeal Swab; Nasopharyngeal(NP) swabs in vial transport medium  Result Value Ref Range Status   SARS Coronavirus 2 by RT PCR NEGATIVE NEGATIVE Final    Comment: (NOTE) SARS-CoV-2 target nucleic acids are NOT DETECTED.  The SARS-CoV-2 RNA is generally detectable in upper respiratory specimens during the acute phase of infection. The lowest concentration of SARS-CoV-2 viral copies this  assay can detect is 138 copies/mL. A negative result does not preclude SARS-Cov-2 infection and should not be used as the sole basis for treatment or other patient management decisions. A negative result may occur with  improper specimen collection/handling, submission of specimen other than nasopharyngeal swab, presence of viral mutation(s) within the areas targeted by this assay, and inadequate number of viral copies(<138 copies/mL). A negative result must be combined with clinical observations, patient history, and epidemiological information. The expected result is Negative.  Fact Sheet for Patients:  EntrepreneurPulse.com.au  Fact Sheet for Healthcare Providers:  IncredibleEmployment.be  This test is no t yet approved or cleared by the [place] FDA and  has been authorized for detection and/or diagnosis of SARS-CoV-2 by FDA under an Emergency Use Authorization (EUA). This EUA will remain  in effect (meaning this test can be used) for the duration of the COVID-19 declaration under Section 564(b)(1) of the Act, 21 U.S.C.section 360bbb-3(b)(1), unless the authorization is terminated  or revoked sooner.       Influenza A by PCR NEGATIVE NEGATIVE Final   Influenza B by PCR NEGATIVE NEGATIVE Final    Comment: (NOTE) The Xpert Xpress SARS-CoV-2/FLU/RSV plus assay is intended as an aid in the diagnosis of influenza from Nasopharyngeal swab specimens and should not be used as a sole basis for treatment. Nasal washings and aspirates are unacceptable for Xpert Xpress SARS-CoV-2/FLU/RSV testing.  Fact Sheet for Patients: EntrepreneurPulse.com.au  Fact Sheet for Healthcare Providers: IncredibleEmployment.be  This test is not yet approved or cleared by the [place] FDA and has been authorized for detection and/or diagnosis of SARS-CoV-2 by FDA under an Emergency Use Authorization (EUA). This EUA will  remain in effect (meaning this test can be used) for the duration of the COVID-19 declaration under Section 564(b)(1) of the Act, 21 U.S.C. section 360bbb-3(b)(1), unless the authorization is terminated or revoked.  Performed at [facility], [place] [address] [place], [place] [address]   MRSA Next Gen by PCR, Nasal     Status: None   Collection Time: 11/02/21 12:53 PM   Specimen: Nasal Mucosa; Nasal Swab  Result Value Ref Range Status   MRSA by PCR Next Gen NOT DETECTED NOT DETECTED Final    Comment: (NOTE) The GeneXpert MRSA Assay (FDA approved for NASAL specimens only), is one component of a comprehensive MRSA colonization surveillance program. It is not intended to diagnose MRSA infection nor to guide or monitor treatment for MRSA infections. Test performance is not FDA approved in patients less than 51 years old. Performed at [facility], [place] [address], [place], [place] [address]          Radiology Studies: IR Angiogram Visceral Selective  Result Date: 11/02/2021 INDICATION: 65 year old male status post recent transrectal prostate biopsy presenting with hematochezia, anemia, and CT arteriogram evidence of acute hemorrhage from the rectum. EXAM: 1. Ultrasound-guided vascular access of the right common femoral artery. 2. Abdominal aortogram. 3. Selective catheterization and angiography of the inferior mesenteric artery, superior rectal artery, bilateral internal iliac arteries, and bilateral inferior rectal arteries 4. Gel-Foam embolization of the distal right inferior rectal artery MEDICATIONS: None. ANESTHESIA/SEDATION: Moderate (conscious) sedation was employed during this procedure. A total of Versed 0 mg and Fentanyl 0 mcg was administered intravenously. Moderate Sedation Time: 0 minutes. The patient's level of consciousness and vital signs were monitored continuously by radiology nursing throughout the procedure under my direct  supervision. CONTRAST:  60mL OMNIPAQUE IOHEXOL 300 MG/ML SOLN, 37mL OMNIPAQUE IOHEXOL 300 MG/ML SOLN, 22mL OMNIPAQUE IOHEXOL 300 MG/ML SOLN FLUOROSCOPY TIME:  Fluoroscopy Time: 31 minutes 12 seconds (2,778 mGy). COMPLICATIONS: None immediate. PROCEDURE: Informed consent was obtained from the patient following explanation of the procedure, risks, benefits and alternatives. The patient understands, agrees and consents for the procedure. All questions were addressed. A time out was performed prior to the initiation of the procedure. Maximal barrier sterile technique utilized including caps, mask, sterile gowns, sterile gloves, large sterile drape, hand hygiene, and Betadine prep. The right groin was prepped and draped in standard fashion. Preprocedure ultrasound evaluation demonstrated patency of the right common femoral artery. A permanent image was captured and stored in the record. Subdermal Local anesthesia was provided at the planned needle entry site with 1% lidocaine. A small skin nick was made. Under direct ultrasound visualization, the right common femoral artery was accessed with standard micropuncture technique. A limited right lower extremity angiogram was performed which demonstrated adequate puncture site for closure device use. A J wire was directed to the abdominal aorta and exchanged for a 5 French, 11 cm vascular sheath. A RIM catheter was introduced and was attempted to be used to engage the IMA however was unsuccessful. Abdominal aortogram was then performed in a steep obliquity with a pigtail flush catheter to demonstrate the ostium of the IMA. The pigtail catheter was then exchanged for a Mickelson catheter which successfully engaged the IMA ostium. A Cook cantata microcatheter and a synchro soft microwire were then inserted into the proximal IMA. IMA angiogram was then performed which demonstrated patency of the superior rectal artery and its branches. There is mild hyperemia in the rectum  without definite evidence of active extravasation. The microcatheter was removed and the Mickelson catheter was redirected to select the left common iliac artery. The catheter was exchanged over a Glidewire for a C2 catheter. The C2 catheter was uses likely internal iliac artery. Internal iliac angiogram was performed which demonstrated patent anterior and posterior proximal branches and in inferior rectal artery arising from the proximal anterior division. The microcatheter and wire were then used to select the inferior rectal artery proximally. Angiogram was performed which demonstrated no evidence of active extravasation. A Waltman loop was formed and the C2 catheter was used to select the right internal iliac artery. Angiogram of the right internal iliac artery demonstrated patent anterior posterior division within inferior rectal artery arising from the proximal aspect of the anterior division. The microcatheter and wire were used to select the inferior rectal artery. Angiogram demonstrated a possible focus of extravasation arising from a more superior branch of the distal inferior rectal artery. The distal branch was selected with the microcatheter. Additional angiogram was performed which demonstrated no evidence of active extravasation  spilling into the rectum, however there was hyperemia and irregularity of the vessels in this region. Therefore, Gel-Foam embolization was performed using approximately 1 mL of Gel-Foam slurry. The catheter was retracted slightly and additional angiogram was performed which demonstrated complete embolization of the distal inferior rectal artery and its branches. No persistent hyperemia was observed. The microcatheter was removed. The C2 catheter was advanced to un formed a Waltman loop and removed. A 6 French Angio-Seal was then placed and deployed without complication or change in pulses. The patient tolerated the procedure well and remained stable throughout the procedure.  The patient was transferred to the floor in stable condition. IMPRESSION: 1. Possible focal extravasation versus hyperemia about a distal branch of the right inferior rectal artery. 2. Successful Gel-Foam slurry embolization of the distal right inferior rectal artery. Ruthann Cancer, MD [facility] Electronically Signed   By: Ruthann Cancer M.D.   On: 11/02/2021 16:47   IR Angiogram Pelvis Selective Or Supraselective  Result Date: 11/02/2021 INDICATION: 65 year old male status post recent transrectal prostate biopsy presenting with hematochezia, anemia, and CT arteriogram evidence of acute hemorrhage from the rectum. EXAM: 1. Ultrasound-guided vascular access of the right common femoral artery. 2. Abdominal aortogram. 3. Selective catheterization and angiography of the inferior mesenteric artery, superior rectal artery, bilateral internal iliac arteries, and bilateral inferior rectal arteries 4. Gel-Foam embolization of the distal right inferior rectal artery MEDICATIONS: None. ANESTHESIA/SEDATION: Moderate (conscious) sedation was employed during this procedure. A total of Versed 0 mg and Fentanyl 0 mcg was administered intravenously. Moderate Sedation Time: 0 minutes. The patient's level of consciousness and vital signs were monitored continuously by radiology nursing throughout the procedure under my direct supervision. CONTRAST:  73mL OMNIPAQUE IOHEXOL 300 MG/ML SOLN, 39mL OMNIPAQUE IOHEXOL 300 MG/ML SOLN, 62mL OMNIPAQUE IOHEXOL 300 MG/ML SOLN FLUOROSCOPY TIME:  Fluoroscopy Time: 31 minutes 12 seconds (2,778 mGy). COMPLICATIONS: None immediate. PROCEDURE: Informed consent was obtained from the patient following explanation of the procedure, risks, benefits and alternatives. The patient understands, agrees and consents for the procedure. All questions were addressed. A time out was performed prior to the initiation of the procedure. Maximal barrier  sterile technique utilized including caps, mask, sterile gowns, sterile gloves, large sterile drape, hand hygiene, and Betadine prep. The right groin was prepped and draped in standard fashion. Preprocedure ultrasound evaluation demonstrated patency of the right common femoral artery. A permanent image was captured and stored in the record. Subdermal Local anesthesia was provided at the planned needle entry site with 1% lidocaine. A small skin nick was made. Under direct ultrasound visualization, the right common femoral artery was accessed with standard micropuncture technique. A limited right lower extremity angiogram was performed which demonstrated adequate puncture site for closure device use. A J wire was directed to the abdominal aorta and exchanged for a 5 French, 11 cm vascular sheath. A RIM catheter was introduced and was attempted to be used to engage the IMA however was unsuccessful. Abdominal aortogram was then performed in a steep obliquity with a pigtail flush catheter to demonstrate the ostium of the IMA. The pigtail catheter was then exchanged for a Mickelson catheter which successfully engaged the IMA ostium. A Cook cantata microcatheter and a synchro soft microwire were then inserted into the proximal IMA. IMA angiogram was then performed which demonstrated patency of the superior rectal artery and its branches. There is mild hyperemia in the rectum without definite evidence of active extravasation. The microcatheter was removed and the  Mickelson catheter was redirected to select the left common iliac artery. The catheter was exchanged over a Glidewire for a C2 catheter. The C2 catheter was uses likely internal iliac artery. Internal iliac angiogram was performed which demonstrated patent anterior and posterior proximal branches and in inferior rectal artery arising from the proximal anterior division. The microcatheter and wire were then used to select the inferior rectal artery proximally.  Angiogram was performed which demonstrated no evidence of active extravasation. A Waltman loop was formed and the C2 catheter was used to select the right internal iliac artery. Angiogram of the right internal iliac artery demonstrated patent anterior posterior division within inferior rectal artery arising from the proximal aspect of the anterior division. The microcatheter and wire were used to select the inferior rectal artery. Angiogram demonstrated a possible focus of extravasation arising from a more superior branch of the distal inferior rectal artery. The distal branch was selected with the microcatheter. Additional angiogram was performed which demonstrated no evidence of active extravasation spilling into the rectum, however there was hyperemia and irregularity of the vessels in this region. Therefore, Gel-Foam embolization was performed using approximately 1 mL of Gel-Foam slurry. The catheter was retracted slightly and additional angiogram was performed which demonstrated complete embolization of the distal inferior rectal artery and its branches. No persistent hyperemia was observed. The microcatheter was removed. The C2 catheter was advanced to un formed a Waltman loop and removed. A 6 French Angio-Seal was then placed and deployed without complication or change in pulses. The patient tolerated the procedure well and remained stable throughout the procedure. The patient was transferred to the floor in stable condition. IMPRESSION: 1. Possible focal extravasation versus hyperemia about a distal branch of the right inferior rectal artery. 2. Successful Gel-Foam slurry embolization of the distal right inferior rectal artery. Ruthann Cancer, MD [facility] Electronically Signed   By: Ruthann Cancer M.D.   On: 11/02/2021 16:47   IR Angiogram Selective Each Additional Vessel  Result Date: 11/02/2021 INDICATION: 65 year old male status post recent  transrectal prostate biopsy presenting with hematochezia, anemia, and CT arteriogram evidence of acute hemorrhage from the rectum. EXAM: 1. Ultrasound-guided vascular access of the right common femoral artery. 2. Abdominal aortogram. 3. Selective catheterization and angiography of the inferior mesenteric artery, superior rectal artery, bilateral internal iliac arteries, and bilateral inferior rectal arteries 4. Gel-Foam embolization of the distal right inferior rectal artery MEDICATIONS: None. ANESTHESIA/SEDATION: Moderate (conscious) sedation was employed during this procedure. A total of Versed 0 mg and Fentanyl 0 mcg was administered intravenously. Moderate Sedation Time: 0 minutes. The patient's level of consciousness and vital signs were monitored continuously by radiology nursing throughout the procedure under my direct supervision. CONTRAST:  33mL OMNIPAQUE IOHEXOL 300 MG/ML SOLN, 51mL OMNIPAQUE IOHEXOL 300 MG/ML SOLN, 40mL OMNIPAQUE IOHEXOL 300 MG/ML SOLN FLUOROSCOPY TIME:  Fluoroscopy Time: 31 minutes 12 seconds (2,778 mGy). COMPLICATIONS: None immediate. PROCEDURE: Informed consent was obtained from the patient following explanation of the procedure, risks, benefits and alternatives. The patient understands, agrees and consents for the procedure. All questions were addressed. A time out was performed prior to the initiation of the procedure. Maximal barrier sterile technique utilized including caps, mask, sterile gowns, sterile gloves, large sterile drape, hand hygiene, and Betadine prep. The right groin was prepped and draped in standard fashion. Preprocedure ultrasound evaluation demonstrated patency of the right common femoral artery. A permanent image was captured and stored in the record. Subdermal Local anesthesia was provided  at the planned needle entry site with 1% lidocaine. A small skin nick was made. Under direct ultrasound visualization, the right common femoral artery was accessed with  standard micropuncture technique. A limited right lower extremity angiogram was performed which demonstrated adequate puncture site for closure device use. A J wire was directed to the abdominal aorta and exchanged for a 5 French, 11 cm vascular sheath. A RIM catheter was introduced and was attempted to be used to engage the IMA however was unsuccessful. Abdominal aortogram was then performed in a steep obliquity with a pigtail flush catheter to demonstrate the ostium of the IMA. The pigtail catheter was then exchanged for a Mickelson catheter which successfully engaged the IMA ostium. A Cook cantata microcatheter and a synchro soft microwire were then inserted into the proximal IMA. IMA angiogram was then performed which demonstrated patency of the superior rectal artery and its branches. There is mild hyperemia in the rectum without definite evidence of active extravasation. The microcatheter was removed and the Mickelson catheter was redirected to select the left common iliac artery. The catheter was exchanged over a Glidewire for a C2 catheter. The C2 catheter was uses likely internal iliac artery. Internal iliac angiogram was performed which demonstrated patent anterior and posterior proximal branches and in inferior rectal artery arising from the proximal anterior division. The microcatheter and wire were then used to select the inferior rectal artery proximally. Angiogram was performed which demonstrated no evidence of active extravasation. A Waltman loop was formed and the C2 catheter was used to select the right internal iliac artery. Angiogram of the right internal iliac artery demonstrated patent anterior posterior division within inferior rectal artery arising from the proximal aspect of the anterior division. The microcatheter and wire were used to select the inferior rectal artery. Angiogram demonstrated a possible focus of extravasation arising from a more superior branch of the distal inferior rectal  artery. The distal branch was selected with the microcatheter. Additional angiogram was performed which demonstrated no evidence of active extravasation spilling into the rectum, however there was hyperemia and irregularity of the vessels in this region. Therefore, Gel-Foam embolization was performed using approximately 1 mL of Gel-Foam slurry. The catheter was retracted slightly and additional angiogram was performed which demonstrated complete embolization of the distal inferior rectal artery and its branches. No persistent hyperemia was observed. The microcatheter was removed. The C2 catheter was advanced to un formed a Waltman loop and removed. A 6 French Angio-Seal was then placed and deployed without complication or change in pulses. The patient tolerated the procedure well and remained stable throughout the procedure. The patient was transferred to the floor in stable condition. IMPRESSION: 1. Possible focal extravasation versus hyperemia about a distal branch of the right inferior rectal artery. 2. Successful Gel-Foam slurry embolization of the distal right inferior rectal artery. Ruthann Cancer, MD [facility] Radiology Electronically Signed   By: Ruthann Cancer M.D.   On: 11/02/2021 16:47   IR Angiogram Selective Each Additional Vessel  Result Date: 11/02/2021 INDICATION: 65 year old male status post recent transrectal prostate biopsy presenting with hematochezia, anemia, and CT arteriogram evidence of acute hemorrhage from the rectum. EXAM: 1. Ultrasound-guided vascular access of the right common femoral artery. 2. Abdominal aortogram. 3. Selective catheterization and angiography of the inferior mesenteric artery, superior rectal artery, bilateral internal iliac arteries, and bilateral inferior rectal arteries 4. Gel-Foam embolization of the distal right inferior rectal artery MEDICATIONS: None. ANESTHESIA/SEDATION: Moderate (conscious) sedation was  employed during  this procedure. A total of Versed 0 mg and Fentanyl 0 mcg was administered intravenously. Moderate Sedation Time: 0 minutes. The patient's level of consciousness and vital signs were monitored continuously by radiology nursing throughout the procedure under my direct supervision. CONTRAST:  3mL OMNIPAQUE IOHEXOL 300 MG/ML SOLN, 35mL OMNIPAQUE IOHEXOL 300 MG/ML SOLN, 30mL OMNIPAQUE IOHEXOL 300 MG/ML SOLN FLUOROSCOPY TIME:  Fluoroscopy Time: 31 minutes 12 seconds (2,778 mGy). COMPLICATIONS: None immediate. PROCEDURE: Informed consent was obtained from the patient following explanation of the procedure, risks, benefits and alternatives. The patient understands, agrees and consents for the procedure. All questions were addressed. A time out was performed prior to the initiation of the procedure. Maximal barrier sterile technique utilized including caps, mask, sterile gowns, sterile gloves, large sterile drape, hand hygiene, and Betadine prep. The right groin was prepped and draped in standard fashion. Preprocedure ultrasound evaluation demonstrated patency of the right common femoral artery. A permanent image was captured and stored in the record. Subdermal Local anesthesia was provided at the planned needle entry site with 1% lidocaine. A small skin nick was made. Under direct ultrasound visualization, the right common femoral artery was accessed with standard micropuncture technique. A limited right lower extremity angiogram was performed which demonstrated adequate puncture site for closure device use. A J wire was directed to the abdominal aorta and exchanged for a 5 French, 11 cm vascular sheath. A RIM catheter was introduced and was attempted to be used to engage the IMA however was unsuccessful. Abdominal aortogram was then performed in a steep obliquity with a pigtail flush catheter to demonstrate the ostium of the IMA. The pigtail catheter was then exchanged for a Mickelson catheter which  successfully engaged the IMA ostium. A Cook cantata microcatheter and a synchro soft microwire were then inserted into the proximal IMA. IMA angiogram was then performed which demonstrated patency of the superior rectal artery and its branches. There is mild hyperemia in the rectum without definite evidence of active extravasation. The microcatheter was removed and the Mickelson catheter was redirected to select the left common iliac artery. The catheter was exchanged over a Glidewire for a C2 catheter. The C2 catheter was uses likely internal iliac artery. Internal iliac angiogram was performed which demonstrated patent anterior and posterior proximal branches and in inferior rectal artery arising from the proximal anterior division. The microcatheter and wire were then used to select the inferior rectal artery proximally. Angiogram was performed which demonstrated no evidence of active extravasation. A Waltman loop was formed and the C2 catheter was used to select the right internal iliac artery. Angiogram of the right internal iliac artery demonstrated patent anterior posterior division within inferior rectal artery arising from the proximal aspect of the anterior division. The microcatheter and wire were used to select the inferior rectal artery. Angiogram demonstrated a possible focus of extravasation arising from a more superior branch of the distal inferior rectal artery. The distal branch was selected with the microcatheter. Additional angiogram was performed which demonstrated no evidence of active extravasation spilling into the rectum, however there was hyperemia and irregularity of the vessels in this region. Therefore, Gel-Foam embolization was performed using approximately 1 mL of Gel-Foam slurry. The catheter was retracted slightly and additional angiogram was performed which demonstrated complete embolization of the distal inferior rectal artery and its branches. No persistent hyperemia was observed.  The microcatheter was removed. The C2 catheter was advanced to un formed a Waltman loop and removed. A 6 French Angio-Seal was then placed and  deployed without complication or change in pulses. The patient tolerated the procedure well and remained stable throughout the procedure. The patient was transferred to the floor in stable condition. IMPRESSION: 1. Possible focal extravasation versus hyperemia about a distal branch of the right inferior rectal artery. 2. Successful Gel-Foam slurry embolization of the distal right inferior rectal artery. Ruthann Cancer, MD [facility] Radiology Electronically Signed   By: Ruthann Cancer M.D.   On: 11/02/2021 16:47   IR Angiogram Selective Each Additional Vessel  Result Date: 11/02/2021 INDICATION: 65 year old male status post recent transrectal prostate biopsy presenting with hematochezia, anemia, and CT arteriogram evidence of acute hemorrhage from the rectum. EXAM: 1. Ultrasound-guided vascular access of the right common femoral artery. 2. Abdominal aortogram. 3. Selective catheterization and angiography of the inferior mesenteric artery, superior rectal artery, bilateral internal iliac arteries, and bilateral inferior rectal arteries 4. Gel-Foam embolization of the distal right inferior rectal artery MEDICATIONS: None. ANESTHESIA/SEDATION: Moderate (conscious) sedation was employed during this procedure. A total of Versed 0 mg and Fentanyl 0 mcg was administered intravenously. Moderate Sedation Time: 0 minutes. The patient's level of consciousness and vital signs were monitored continuously by radiology nursing throughout the procedure under my direct supervision. CONTRAST:  108mL OMNIPAQUE IOHEXOL 300 MG/ML SOLN, 33mL OMNIPAQUE IOHEXOL 300 MG/ML SOLN, 7mL OMNIPAQUE IOHEXOL 300 MG/ML SOLN FLUOROSCOPY TIME:  Fluoroscopy Time: 31 minutes 12 seconds (2,778 mGy). COMPLICATIONS: None immediate. PROCEDURE: Informed consent was  obtained from the patient following explanation of the procedure, risks, benefits and alternatives. The patient understands, agrees and consents for the procedure. All questions were addressed. A time out was performed prior to the initiation of the procedure. Maximal barrier sterile technique utilized including caps, mask, sterile gowns, sterile gloves, large sterile drape, hand hygiene, and Betadine prep. The right groin was prepped and draped in standard fashion. Preprocedure ultrasound evaluation demonstrated patency of the right common femoral artery. A permanent image was captured and stored in the record. Subdermal Local anesthesia was provided at the planned needle entry site with 1% lidocaine. A small skin nick was made. Under direct ultrasound visualization, the right common femoral artery was accessed with standard micropuncture technique. A limited right lower extremity angiogram was performed which demonstrated adequate puncture site for closure device use. A J wire was directed to the abdominal aorta and exchanged for a 5 French, 11 cm vascular sheath. A RIM catheter was introduced and was attempted to be used to engage the IMA however was unsuccessful. Abdominal aortogram was then performed in a steep obliquity with a pigtail flush catheter to demonstrate the ostium of the IMA. The pigtail catheter was then exchanged for a Mickelson catheter which successfully engaged the IMA ostium. A Cook cantata microcatheter and a synchro soft microwire were then inserted into the proximal IMA. IMA angiogram was then performed which demonstrated patency of the superior rectal artery and its branches. There is mild hyperemia in the rectum without definite evidence of active extravasation. The microcatheter was removed and the Mickelson catheter was redirected to select the left common iliac artery. The catheter was exchanged over a Glidewire for a C2 catheter. The C2 catheter was uses likely internal iliac artery.  Internal iliac angiogram was performed which demonstrated patent anterior and posterior proximal branches and in inferior rectal artery arising from the proximal anterior division. The microcatheter and wire were then used to select the inferior rectal artery proximally. Angiogram was performed which demonstrated no evidence of active extravasation. A Waltman  loop was formed and the C2 catheter was used to select the right internal iliac artery. Angiogram of the right internal iliac artery demonstrated patent anterior posterior division within inferior rectal artery arising from the proximal aspect of the anterior division. The microcatheter and wire were used to select the inferior rectal artery. Angiogram demonstrated a possible focus of extravasation arising from a more superior branch of the distal inferior rectal artery. The distal branch was selected with the microcatheter. Additional angiogram was performed which demonstrated no evidence of active extravasation spilling into the rectum, however there was hyperemia and irregularity of the vessels in this region. Therefore, Gel-Foam embolization was performed using approximately 1 mL of Gel-Foam slurry. The catheter was retracted slightly and additional angiogram was performed which demonstrated complete embolization of the distal inferior rectal artery and its branches. No persistent hyperemia was observed. The microcatheter was removed. The C2 catheter was advanced to un formed a Waltman loop and removed. A 6 French Angio-Seal was then placed and deployed without complication or change in pulses. The patient tolerated the procedure well and remained stable throughout the procedure. The patient was transferred to the floor in stable condition. IMPRESSION: 1. Possible focal extravasation versus hyperemia about a distal branch of the right inferior rectal artery. 2. Successful Gel-Foam slurry embolization of the distal right inferior rectal artery. Ruthann Cancer,  MD [facility] Radiology Electronically Signed   By: Ruthann Cancer M.D.   On: 11/02/2021 16:47   IR US Guide Vasc Access Right  Result Date: 11/02/2021 INDICATION: 65 year old male status post recent transrectal prostate biopsy presenting with hematochezia, anemia, and CT arteriogram evidence of acute hemorrhage from the rectum. EXAM: 1. Ultrasound-guided vascular access of the right common femoral artery. 2. Abdominal aortogram. 3. Selective catheterization and angiography of the inferior mesenteric artery, superior rectal artery, bilateral internal iliac arteries, and bilateral inferior rectal arteries 4. Gel-Foam embolization of the distal right inferior rectal artery MEDICATIONS: None. ANESTHESIA/SEDATION: Moderate (conscious) sedation was employed during this procedure. A total of Versed 0 mg and Fentanyl 0 mcg was administered intravenously. Moderate Sedation Time: 0 minutes. The patient's level of consciousness and vital signs were monitored continuously by radiology nursing throughout the procedure under my direct supervision. CONTRAST:  8mL OMNIPAQUE IOHEXOL 300 MG/ML SOLN, 65mL OMNIPAQUE IOHEXOL 300 MG/ML SOLN, 62mL OMNIPAQUE IOHEXOL 300 MG/ML SOLN FLUOROSCOPY TIME:  Fluoroscopy Time: 31 minutes 12 seconds (2,778 mGy). COMPLICATIONS: None immediate. PROCEDURE: Informed consent was obtained from the patient following explanation of the procedure, risks, benefits and alternatives. The patient understands, agrees and consents for the procedure. All questions were addressed. A time out was performed prior to the initiation of the procedure. Maximal barrier sterile technique utilized including caps, mask, sterile gowns, sterile gloves, large sterile drape, hand hygiene, and Betadine prep. The right groin was prepped and draped in standard fashion. Preprocedure ultrasound evaluation demonstrated patency of the right common femoral artery. A permanent image  was captured and stored in the record. Subdermal Local anesthesia was provided at the planned needle entry site with 1% lidocaine. A small skin nick was made. Under direct ultrasound visualization, the right common femoral artery was accessed with standard micropuncture technique. A limited right lower extremity angiogram was performed which demonstrated adequate puncture site for closure device use. A J wire was directed to the abdominal aorta and exchanged for a 5 French, 11 cm vascular sheath. A RIM catheter was introduced and was attempted to be used to engage the IMA  however was unsuccessful. Abdominal aortogram was then performed in a steep obliquity with a pigtail flush catheter to demonstrate the ostium of the IMA. The pigtail catheter was then exchanged for a Mickelson catheter which successfully engaged the IMA ostium. A Cook cantata microcatheter and a synchro soft microwire were then inserted into the proximal IMA. IMA angiogram was then performed which demonstrated patency of the superior rectal artery and its branches. There is mild hyperemia in the rectum without definite evidence of active extravasation. The microcatheter was removed and the Mickelson catheter was redirected to select the left common iliac artery. The catheter was exchanged over a Glidewire for a C2 catheter. The C2 catheter was uses likely internal iliac artery. Internal iliac angiogram was performed which demonstrated patent anterior and posterior proximal branches and in inferior rectal artery arising from the proximal anterior division. The microcatheter and wire were then used to select the inferior rectal artery proximally. Angiogram was performed which demonstrated no evidence of active extravasation. A Waltman loop was formed and the C2 catheter was used to select the right internal iliac artery. Angiogram of the right internal iliac artery demonstrated patent anterior posterior division within inferior rectal artery arising  from the proximal aspect of the anterior division. The microcatheter and wire were used to select the inferior rectal artery. Angiogram demonstrated a possible focus of extravasation arising from a more superior branch of the distal inferior rectal artery. The distal branch was selected with the microcatheter. Additional angiogram was performed which demonstrated no evidence of active extravasation spilling into the rectum, however there was hyperemia and irregularity of the vessels in this region. Therefore, Gel-Foam embolization was performed using approximately 1 mL of Gel-Foam slurry. The catheter was retracted slightly and additional angiogram was performed which demonstrated complete embolization of the distal inferior rectal artery and its branches. No persistent hyperemia was observed. The microcatheter was removed. The C2 catheter was advanced to un formed a Waltman loop and removed. A 6 French Angio-Seal was then placed and deployed without complication or change in pulses. The patient tolerated the procedure well and remained stable throughout the procedure. The patient was transferred to the floor in stable condition. IMPRESSION: 1. Possible focal extravasation versus hyperemia about a distal branch of the right inferior rectal artery. 2. Successful Gel-Foam slurry embolization of the distal right inferior rectal artery. Ruthann Cancer, MD [facility] Radiology Electronically Signed   By: Ruthann Cancer M.D.   On: 11/02/2021 16:47   ECHOCARDIOGRAM COMPLETE  Result Date: 11/03/2021    ECHOCARDIOGRAM REPORT   Patient Name:   JARVIN OGREN Date of Exam: 11/03/2021 Medical Rec #:  [id number]       Height:       74.0 in Accession #:    [id number]      Weight:       250.0 lb Date of Birth:  1956/09/05       BSA:          2.390 m Patient Age:    67 years        BP:           118/73 mmHg Patient Gender: M               HR:           82 bpm. Exam Location:   Inpatient Procedure: 2D Echo, Color Doppler and Cardiac Doppler Indications:    Chest pain  History:  Patient has prior history of Echocardiogram examinations. Risk                 Factors:Hypertension, Diabetes and Sleep Apnea.  Sonographer:    Jyl Heinz Referring Phys: [id number] [place]  1. Left ventricular ejection fraction, by estimation, is 65 to 70%. The left ventricle has normal function. The left ventricle has no regional wall motion abnormalities. There is mild concentric left ventricular hypertrophy. Left ventricular diastolic parameters are consistent with Grade I diastolic dysfunction (impaired relaxation).  2. Right ventricular systolic function is normal. The right ventricular size is normal. Tricuspid regurgitation signal is inadequate for assessing PA pressure.  3. The mitral valve is grossly normal. No evidence of mitral valve regurgitation. No evidence of mitral stenosis.  4. The aortic valve is tricuspid. Aortic valve regurgitation is not visualized. No aortic stenosis is present.  5. Aortic dilatation noted. There is mild dilatation of the aortic root, measuring 40 mm. Conclusion(s)/Recommendation(s): Normal biventricular function without evidence of hemodynamically significant valvular heart disease. FINDINGS  Left Ventricle: Left ventricular ejection fraction, by estimation, is 65 to 70%. The left ventricle has normal function. The left ventricle has no regional wall motion abnormalities. The left ventricular internal cavity size was normal in size. There is  mild concentric left ventricular hypertrophy. Left ventricular diastolic parameters are consistent with Grade I diastolic dysfunction (impaired relaxation). Right Ventricle: The right ventricular size is normal. No increase in right ventricular wall thickness. Right ventricular systolic function is normal. Tricuspid regurgitation signal is inadequate for assessing PA pressure. Left Atrium: Left atrial size was  normal in size. Right Atrium: Right atrial size was normal in size. Pericardium: There is no evidence of pericardial effusion. Mitral Valve: The mitral valve is grossly normal. Mild mitral annular calcification. No evidence of mitral valve regurgitation. No evidence of mitral valve stenosis. Tricuspid Valve: The tricuspid valve is grossly normal. Tricuspid valve regurgitation is trivial. No evidence of tricuspid stenosis. Aortic Valve: The aortic valve is tricuspid. Aortic valve regurgitation is not visualized. No aortic stenosis is present. Aortic valve mean gradient measures 10.0 mmHg. Aortic valve peak gradient measures 17.5 mmHg. Aortic valve area, by VTI measures 4.49 cm. Pulmonic Valve: The pulmonic valve was grossly normal. Pulmonic valve regurgitation is not visualized. No evidence of pulmonic stenosis. Aorta: Aortic dilatation noted. There is mild dilatation of the aortic root, measuring 40 mm. Venous: The inferior vena cava was not well visualized. IAS/Shunts: The atrial septum is grossly normal.  LEFT VENTRICLE PLAX 2D LVIDd:         4.10 cm     Diastology LVIDs:         2.50 cm     LV e' medial:    8.55 cm/s LV PW:         1.30 cm     LV E/e' medial:  10.2 LV IVS:        1.30 cm     LV e' lateral:   8.38 cm/s LVOT diam:     2.40 cm     LV E/e' lateral: 10.4 LV SV:         159 LV SV Index:   67 LVOT Area:     4.52 cm  LV Volumes (MOD) LV vol d, MOD A2C: 97.2 ml LV vol d, MOD A4C: 92.8 ml LV vol s, MOD A2C: 32.5 ml LV vol s, MOD A4C: 33.3 ml LV SV MOD A2C:     64.7 ml LV SV MOD  A4C:     92.8 ml LV SV MOD BP:      63.0 ml RIGHT VENTRICLE RV Basal diam:  2.90 cm RV Mid diam:    2.60 cm RV S prime:     12.60 cm/s TAPSE (M-mode): 2.5 cm LEFT ATRIUM             Index        RIGHT ATRIUM           Index LA diam:        3.20 cm 1.34 cm/m   RA Area:     18.70 cm LA Vol (A2C):   47.4 ml 19.84 ml/m  RA Volume:   54.50 ml  22.81 ml/m LA Vol (A4C):   36.8 ml 15.40 ml/m LA Biplane Vol: 43.5 ml 18.20 ml/m   AORTIC VALVE AV Area (Vmax):    3.96 cm AV Area (Vmean):   4.21 cm AV Area (VTI):     4.49 cm AV Vmax:           209.00 cm/s AV Vmean:          152.500 cm/s AV VTI:            0.355 m AV Peak Grad:      17.5 mmHg AV Mean Grad:      10.0 mmHg LVOT Vmax:         183.00 cm/s LVOT Vmean:        142.000 cm/s LVOT VTI:          0.352 m LVOT/AV VTI ratio: 0.99  AORTA Ao Root diam: 4.00 cm Ao Asc diam:  3.40 cm MITRAL VALVE MV Area (PHT): 3.36 cm     SHUNTS MV Decel Time: 226 msec     Systemic VTI:  0.35 m MV E velocity: 86.90 cm/s   Systemic Diam: 2.40 cm MV A velocity: 128.00 cm/s MV E/A ratio:  0.68 Eleonore Chiquito MD Electronically signed by Eleonore Chiquito MD Signature Date/Time: 11/03/2021/3:38:33 PM    Final    IR EMBO ART  VEN HEMORR LYMPH EXTRAV  INC GUIDE ROADMAPPING  Result Date: 11/02/2021 INDICATION: 65 year old male status post recent transrectal prostate biopsy presenting with hematochezia, anemia, and CT arteriogram evidence of acute hemorrhage from the rectum. EXAM: 1. Ultrasound-guided vascular access of the right common femoral artery. 2. Abdominal aortogram. 3. Selective catheterization and angiography of the inferior mesenteric artery, superior rectal artery, bilateral internal iliac arteries, and bilateral inferior rectal arteries 4. Gel-Foam embolization of the distal right inferior rectal artery MEDICATIONS: None. ANESTHESIA/SEDATION: Moderate (conscious) sedation was employed during this procedure. A total of Versed 0 mg and Fentanyl 0 mcg was administered intravenously. Moderate Sedation Time: 0 minutes. The patient's level of consciousness and vital signs were monitored continuously by radiology nursing throughout the procedure under my direct supervision. CONTRAST:  85mL OMNIPAQUE IOHEXOL 300 MG/ML SOLN, 73mL OMNIPAQUE IOHEXOL 300 MG/ML SOLN, 80mL OMNIPAQUE IOHEXOL 300 MG/ML SOLN FLUOROSCOPY TIME:  Fluoroscopy Time: 31 minutes 12 seconds (2,778 mGy). COMPLICATIONS: None immediate. PROCEDURE:  Informed consent was obtained from the patient following explanation of the procedure, risks, benefits and alternatives. The patient understands, agrees and consents for the procedure. All questions were addressed. A time out was performed prior to the initiation of the procedure. Maximal barrier sterile technique utilized including caps, mask, sterile gowns, sterile gloves, large sterile drape, hand hygiene, and Betadine prep. The right groin was prepped and draped in standard fashion. Preprocedure ultrasound evaluation demonstrated patency of the right  common femoral artery. A permanent image was captured and stored in the record. Subdermal Local anesthesia was provided at the planned needle entry site with 1% lidocaine. A small skin nick was made. Under direct ultrasound visualization, the right common femoral artery was accessed with standard micropuncture technique. A limited right lower extremity angiogram was performed which demonstrated adequate puncture site for closure device use. A J wire was directed to the abdominal aorta and exchanged for a 5 French, 11 cm vascular sheath. A RIM catheter was introduced and was attempted to be used to engage the IMA however was unsuccessful. Abdominal aortogram was then performed in a steep obliquity with a pigtail flush catheter to demonstrate the ostium of the IMA. The pigtail catheter was then exchanged for a Mickelson catheter which successfully engaged the IMA ostium. A Cook cantata microcatheter and a synchro soft microwire were then inserted into the proximal IMA. IMA angiogram was then performed which demonstrated patency of the superior rectal artery and its branches. There is mild hyperemia in the rectum without definite evidence of active extravasation. The microcatheter was removed and the Mickelson catheter was redirected to select the left common iliac artery. The catheter was exchanged over a Glidewire for a C2 catheter. The C2 catheter was uses likely  internal iliac artery. Internal iliac angiogram was performed which demonstrated patent anterior and posterior proximal branches and in inferior rectal artery arising from the proximal anterior division. The microcatheter and wire were then used to select the inferior rectal artery proximally. Angiogram was performed which demonstrated no evidence of active extravasation. A Waltman loop was formed and the C2 catheter was used to select the right internal iliac artery. Angiogram of the right internal iliac artery demonstrated patent anterior posterior division within inferior rectal artery arising from the proximal aspect of the anterior division. The microcatheter and wire were used to select the inferior rectal artery. Angiogram demonstrated a possible focus of extravasation arising from a more superior branch of the distal inferior rectal artery. The distal branch was selected with the microcatheter. Additional angiogram was performed which demonstrated no evidence of active extravasation spilling into the rectum, however there was hyperemia and irregularity of the vessels in this region. Therefore, Gel-Foam embolization was performed using approximately 1 mL of Gel-Foam slurry. The catheter was retracted slightly and additional angiogram was performed which demonstrated complete embolization of the distal inferior rectal artery and its branches. No persistent hyperemia was observed. The microcatheter was removed. The C2 catheter was advanced to un formed a Waltman loop and removed. A 6 French Angio-Seal was then placed and deployed without complication or change in pulses. The patient tolerated the procedure well and remained stable throughout the procedure. The patient was transferred to the floor in stable condition. IMPRESSION: 1. Possible focal extravasation versus hyperemia about a distal branch of the right inferior rectal artery. 2. Successful Gel-Foam slurry embolization of the distal right inferior  rectal artery. Ruthann Cancer, MD [facility] Radiology Electronically Signed   By: Ruthann Cancer M.D.   On: 11/02/2021 16:47   CT Angio Abd/Pel w/ and/or w/o  Result Date: 11/02/2021 CLINICAL DATA:  Acute lower GI bleeding, status post prostate biopsy last week. EXAM: CT ANGIOGRAPHY ABDOMEN AND PELVIS WITH CONTRAST AND WITHOUT CONTRAST TECHNIQUE: Multidetector CT imaging of the abdomen and pelvis was performed using the standard protocol during bolus administration of intravenous contrast. Multiplanar reconstructed images and MIPs were obtained and reviewed to evaluate the vascular anatomy. CONTRAST:  159mL OMNIPAQUE IOHEXOL 350 MG/ML SOLN COMPARISON:  10/08/2020 FINDINGS: VASCULAR Aorta: Minor aortic atherosclerosis. Negative for aneurysm, dissection or occlusive process. No retroperitoneal hemorrhage or hematoma. Celiac: Minor atherosclerotic origin but remains patent including its branches SMA: Minor atherosclerotic origin but remains patent including its branches Renals: Main renal arteries remain patent. Minor atherosclerotic changes. No accessory renal artery. IMA: Remains patent off the distal aorta anteriorly including its branches. Lower rectum active contrast extravasation on the arterial phase which progresses on the delayed portal venous phase imaging compatible with acute active lower rectal GI bleeding. Inflow: Iliac atherosclerosis noted but no inflow disease or occlusion. Mild iliac tortuosity. Common, internal and external iliac arteries all remain patent. Proximal Outflow: Common femoral, proximal profunda femoral, proximal superficial femoral arteries all remain patent. Veins: No veno-occlusive process. Review of the MIP images confirms the above findings. NON-VASCULAR Lower chest: No acute abnormality. Hepatobiliary: No focal hepatic abnormality. Small left hepatic lobe noted. No biliary obstruction pattern dilatation. Patent portal and hepatic  veins. Small gallstones noted. Common bile duct nondilated. Pancreas: Unremarkable. No pancreatic ductal dilatation or surrounding inflammatory changes. Spleen: Normal in size without focal abnormality. Adrenals/Urinary Tract: Adrenal glands are unremarkable. Kidneys are normal, without renal calculi, focal lesion, or hydronephrosis. Bladder is unremarkable. Stomach/Bowel: Postop changes from gastric bypass. Negative for bowel obstruction, significant dilatation, ileus, or free air. Appendix not visualized. No free fluid, fluid collection, abscess or ascites. Lymphatic: No bulky adenopathy. Reproductive: No other significant finding by CT. Other: No abdominal wall hernia or abnormality. No abdominopelvic ascites. Musculoskeletal: Degenerative changes throughout the spine. Multilevel lower lumbar facet arthropathy. No acute osseous finding. IMPRESSION: VASCULAR Positive CTA for acute lower rectal GI bleeding. These results were called by telephone at the time of interpretation on 11/02/2021 at 11:16 am to provider AMY ESTERWOOD , who verbally acknowledged these results. NON-VASCULAR Remote gastric bypass surgery Incidental cholelithiasis Electronically Signed   By: Jerilynn Mages.  Shick M.D.   On: 11/02/2021 11:21        Scheduled Meds:  atorvastatin  80 mg Oral QHS   escitalopram  10 mg Oral Daily   folic acid  1 mg Oral Daily   gabapentin  200 mg Oral QHS   insulin aspart  0-9 Units Subcutaneous TID WC   pantoprazole (PROTONIX) IV  40 mg Intravenous Q12H   sodium phosphate  1 enema Rectal Once   vitamin B-12  1,000 mcg Oral Daily   Continuous Infusions:  sodium chloride 125 mL/hr at 11/03/21 1201   ferric gluconate (FERRLECIT) IVPB 125 mg (11/03/21 1540)     LOS: 0 days    Time spent: 40 minutes    Irine Seal, MD [facility]   To contact the attending provider between 7A-7P or the covering provider during after hours 7P-7A, please log into the web site [email or web address] and access using  universal [place] password for that web site. If you do not have the password, please call the hospital operator.  11/03/2021, 5:26 PM

## 2021-11-03 NOTE — Progress Notes (Signed)
Our service was called urgently by internal medicine at approximately 5 PM today for recurrent lower GI bleeding in this patient.  He had a prostate biopsy last week and was admitted early yesterday with profuse bright red blood per rectum and acute blood loss anemia. CT angiogram confirmed bleeding from the rectum, patient went to IR and had Gelfoam embolization of the right inferior rectal artery (no active extravasation was visualized).  There was no recurrent bleeding until late this afternoon, approximately 4 PM according to the patient's nurse.  He passed a large amount of fresh clotted blood, but has remained hemodynamically stable with normal blood pressure and heart rate throughout (including vital signs taken just now the time of my evaluation)  He currently denies chest pain, dyspnea, abdominal or rectal pain.  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  Hemoglobin late yesterday was 6.8, 6.1 earlier today, and a recheck this evening 6.8.  On exam his cardiac rhythm is regular without appreciable murmur, lungs are clear bilaterally with good air entry and no respiratory distress, abdomen is soft nondistended nontender  He has recurrent bleeding from his prostate biopsy site, perhaps a vessel within the rectal wall or the prostate itself.  Therapeutic options appear limited at this point.  It is not clear that the source will be visualized or amenable to endoscopic intervention, but a sigmoidoscopy is warranted to evaluate.  If source unable to be localized and is not amenable to endoscopic therapy, and patient would need repeat evaluation by interventional radiology.  Sigmoidoscopy was discussed in detail along with risks and benefits and he was agreeable.  The benefits and risks of the planned procedure were described in detail with the patient or (when appropriate) their health care proxy.  Risks were outlined as including, but not limited to,  bleeding, infection, perforation, adverse medication reaction leading to cardiac or pulmonary decompensation, pancreatitis (if ERCP).  The limitation of incomplete mucosal visualization was also discussed.  No guarantees or warranties were given.  He was also aware that his severe anemia without transfusion increases the sedation related risks of this procedure including, but not limited to, end-organ damage, stroke, heart attack and death.  Consent obtained in the presence of his nurse and our endoscopy nurse.  His wife [name] and a close friend were at the bedside throughout my visit, all questions were answered to their satisfaction.  Our service also contacted the interventional radiology physician on-call ( Mir) to discuss the case and make them aware of this patient's situation.  They concurred with sigmoidoscopy, after which I will communicate findings to them.  Wilfrid Lund, MD [facility]

## 2021-11-03 NOTE — Anesthesia Preprocedure Evaluation (Addendum)
Anesthesia Evaluation  Patient identified by MRN, date of birth, ID band Patient awake    Reviewed: Allergy & Precautions, NPO status , Patient's Chart, lab work & pertinent test results  Airway Mallampati: II  TM Distance: >3 FB Neck ROM: Full    Dental  (+) Dental Advisory Given, Implants   Pulmonary sleep apnea , former smoker, PE   Pulmonary exam normal breath sounds clear to auscultation       Cardiovascular hypertension, + CAD  Normal cardiovascular exam+ dysrhythmias (RBBB)  Rhythm:Regular Rate:Normal     Neuro/Psych  Headaches, PSYCHIATRIC DISORDERS Depression  Neuromuscular disease    GI/Hepatic Neg liver ROS, GERD  ,  Endo/Other  diabetes, Type 2, Oral Hypoglycemic Agents  Renal/GU Renal InsufficiencyRenal disease   Prostate cancer     Musculoskeletal  (+) Arthritis ,   Abdominal   Peds  Hematology  (+) Blood dyscrasia, anemia ,   Anesthesia Other Findings Day of surgery medications reviewed with the patient.  Reproductive/Obstetrics                            Anesthesia Physical Anesthesia Plan  ASA: 3 and emergent  Anesthesia Plan: MAC   Post-op Pain Management:    Induction: Intravenous  PONV Risk Score and Plan: 1 and Propofol infusion and Treatment may vary due to age or medical condition  Airway Management Planned: Natural Airway and Nasal Cannula  Additional Equipment:   Intra-op Plan:   Post-operative Plan:   Informed Consent: I have reviewed the patients History and Physical, chart, labs and discussed the procedure including the risks, benefits and alternatives for the proposed anesthesia with the patient or authorized representative who has indicated his/her understanding and acceptance.     Dental advisory given  Plan Discussed with: CRNA  Anesthesia Plan Comments:        Anesthesia Quick Evaluation

## 2021-11-03 NOTE — Progress Notes (Signed)
Pt's R groin site is clean, dry, and intact. Pt reports no pain. Resting comfortably

## 2021-11-03 NOTE — Interval H&P Note (Signed)
History and Physical Interval Note:  11/03/2021 7:43 PM  [name]  has presented today for surgery, with the diagnosis of GI bleed/rectal.  The various methods of treatment have been discussed with the patient and family. After consideration of risks, benefits and other options for treatment, the patient has consented to  Procedure(s): FLEXIBLE SIGMOIDOSCOPY (N/A) as a surgical intervention.  The patient's history has been reviewed, patient examined, no change in status, stable for surgery.  I have reviewed the patient's chart and labs.  Questions were answered to the patient's satisfaction.     [name]

## 2021-11-03 NOTE — Op Note (Signed)
[facility] Patient Name: [name] Procedure Date: 11/03/2021 MRN: [id number] Attending MD: Estill Cotta. Loletha Carrow , MD Date of Birth: 31-May-1956 CSN: [id number] Age: 65 Admit Type: Inpatient Procedure:                Flexible Sigmoidoscopy Indications:              Hematochezia                           bleeding after prostate Bx - underwent IR                            embolization yesterday. Passed large amount clotted                            blood today, remained hemodynamically stable, Hgb                            stable from yesterday Providers:                Estill Cotta. Loletha Carrow, MD, Particia Nearing, RN, Cherylynn Ridges, Technician Referring MD:             [facility] (D. Grandville Silos) Medicines:                Monitored Anesthesia Care Complications:            No immediate complications. Estimated Blood Loss:     Estimated blood loss: none. Procedure:                Pre-Anesthesia Assessment:                           - Prior to the procedure, a History and Physical                            was performed, and patient medications and                            allergies were reviewed. The patient's tolerance of                            previous anesthesia was also reviewed. The risks                            and benefits of the procedure and the sedation                            options and risks were discussed with the patient.                            All questions were answered, and informed consent                            was obtained. Prior Anticoagulants: The patient  has                            taken Coumadin (warfarin), last dose was 1 day                            prior to procedure. ASA Grade Assessment: IV - A                            patient with severe systemic disease that is a                            constant threat to life. After reviewing the risks                            and benefits, the  patient was deemed in                            satisfactory condition to undergo the procedure.                           After obtaining informed consent, the scope was                            passed under direct vision. The [id number]-[id number] ([id number])                            Olympus endoscope was introduced through the anus                            and advanced to the the sigmoid colon. The flexible                            sigmoidoscopy was accomplished without difficulty.                            The patient tolerated the procedure well. The                            quality of the bowel preparation was poor.                            Technical problems precluded photographs (which                            were of poor quality in any case due to retained                            blood). Scope In: Scope Out: Findings:      The perianal and digital rectal examinations were normal.      Copious clots and adherent black blood was found in the rectum and in       the sigmoid colon. Lavage of the area was performed using a large  amount, resulting in incomplete clearance with continued poor       visualization. No active bleeding source was seen, no biopsy site       identified. Impression:               - Preparation of the colon was poor.                           - Blood in the rectum and in the sigmoid colon.                           - No specimens collected. Moderate Sedation:      MAC sedation used Recommendation:           - Return patient to hospital ward for ongoing care.                           - NPO.                           - CBC in AM                           If patient passes bright red blood overnight,                            obtain repeat CBC. If bleeding accompanied by                            hypotension or tachycardia, call IR physician on                            call.                           Case discussed with patient's family and IR  on-call                            physician (Mir). Procedure Code(s):        --- Professional ---                           [phone], Sigmoidoscopy, flexible; diagnostic,                            including collection of specimen(s) by brushing or                            washing, when performed (separate procedure) Diagnosis Code(s):        --- Professional ---                           K62.5, Hemorrhage of anus and rectum                           K92.2, Gastrointestinal hemorrhage, unspecified  K92.1, Melena (includes Hematochezia) CPT copyright 2019 [employer]. All rights reserved. The codes documented in this report are preliminary and upon coder review may  be revised to meet current compliance requirements. Lola Czerwonka L. Loletha Carrow, MD 11/03/2021 8:38:52 PM This report has been signed electronically. Number of Addenda: 0

## 2021-11-03 NOTE — Progress Notes (Addendum)
Patient ID: [name], male   DOB: August 11, 1956, 65 y.o.   MRN: [id number]    Progress Note   Subjective   Day # 2  CC; acute GI bleed/bright red blood per rectum  Hemoglobin 11.5 on admission yesterday, down to 6.8 this a.m. BUN 36/creatinine 1.67  Status post IR embolization of distal right inferior rectal artery yesterday secondary to major hemorrhage  Patient had a few clots on the pad during the night, no bowel movements or further bleeding since procedure Family and minister at bedside this morning, lots of questions. Patient feels very fatigued, no complaints of abdominal pain rectal pain or back pain.   Blood pressure stable overnight systolic 607 range    Objective   Vital signs in last 24 hours: Temp:  [96.9 F (36.1 C)-98.9 F (37.2 C)] 98.9 F (37.2 C) (11/29 0900) Pulse Rate:  [42-94] 78 (11/29 0900) Resp:  [14-23] 16 (11/29 0900) BP: (65-128)/(46-83) 111/62 (11/29 0900) SpO2:  [99 %-100 %] 100 % (11/29 0900) Last BM Date: 11/02/21 General:    Older African-American in NAD, pale Heart:  Regular rate and rhythm; no murmurs Lungs: Respirations even and unlabored, lungs CTA bilaterally Abdomen:  Soft, nontender and nondistended. Normal bowel sounds. Extremities:  Without edema. Neurologic:  Alert and oriented,  grossly normal neurologically. Psych:  Cooperative. Normal mood and affect.  Intake/Output from previous day: 11/28 0701 - 11/29 0700 In: 1324.1 [I.V.:457.4; IV Piggyback:866.7] Out: 525 [Urine:525] Intake/Output this shift: No intake/output data recorded.  Lab Results: Recent Labs    11/02/21 0340 11/02/21 1224 11/02/21 1712 11/02/21 2215 11/03/21 0455  WBC 6.0  --   --   --  7.8  HGB 11.5*   < > 8.0* 7.5* 6.8*  HCT 37.7*   < > 26.0* 24.0* 22.2*  PLT 191  --   --   --  146*   < > = values in this interval not displayed.   BMET Recent Labs    11/02/21 0340 11/03/21 0455  NA 139 134*  K 4.9 4.5  CL 105 107  CO2 25 21*  GLUCOSE  101* 168*  BUN 31* 36*  CREATININE 1.29* 1.67*  CALCIUM 8.9 8.1*   LFT Recent Labs    11/03/21 0455  PROT 4.9*  ALBUMIN 3.1*  AST 28  ALT 27  ALKPHOS 37*  BILITOT 0.5   PT/INR Recent Labs    11/02/21 0638  LABPROT 18.4*  INR 1.5*    Studies/Results: IR Angiogram Visceral Selective  Result Date: 11/02/2021 INDICATION: 65 year old male status post recent transrectal prostate biopsy presenting with hematochezia, anemia, and CT arteriogram evidence of acute hemorrhage from the rectum. EXAM: 1. Ultrasound-guided vascular access of the right common femoral artery. 2. Abdominal aortogram. 3. Selective catheterization and angiography of the inferior mesenteric artery, superior rectal artery, bilateral internal iliac arteries, and bilateral inferior rectal arteries 4. Gel-Foam embolization of the distal right inferior rectal artery MEDICATIONS: None. ANESTHESIA/SEDATION: Moderate (conscious) sedation was employed during this procedure. A total of Versed 0 mg and Fentanyl 0 mcg was administered intravenously. Moderate Sedation Time: 0 minutes. The patient's level of consciousness and vital signs were monitored continuously by radiology nursing throughout the procedure under my direct supervision. CONTRAST:  56mL OMNIPAQUE IOHEXOL 300 MG/ML SOLN, 81mL OMNIPAQUE IOHEXOL 300 MG/ML SOLN, 20mL OMNIPAQUE IOHEXOL 300 MG/ML SOLN FLUOROSCOPY TIME:  Fluoroscopy Time: 31 minutes 12 seconds (2,778 mGy). COMPLICATIONS: None immediate. PROCEDURE: Informed consent was obtained from the patient following explanation of the procedure,  risks, benefits and alternatives. The patient understands, agrees and consents for the procedure. All questions were addressed. A time out was performed prior to the initiation of the procedure. Maximal barrier sterile technique utilized including caps, mask, sterile gowns, sterile gloves, large sterile drape, hand hygiene, and Betadine prep. The right groin was prepped and draped in  standard fashion. Preprocedure ultrasound evaluation demonstrated patency of the right common femoral artery. A permanent image was captured and stored in the record. Subdermal Local anesthesia was provided at the planned needle entry site with 1% lidocaine. A small skin nick was made. Under direct ultrasound visualization, the right common femoral artery was accessed with standard micropuncture technique. A limited right lower extremity angiogram was performed which demonstrated adequate puncture site for closure device use. A J wire was directed to the abdominal aorta and exchanged for a 5 French, 11 cm vascular sheath. A RIM catheter was introduced and was attempted to be used to engage the IMA however was unsuccessful. Abdominal aortogram was then performed in a steep obliquity with a pigtail flush catheter to demonstrate the ostium of the IMA. The pigtail catheter was then exchanged for a Mickelson catheter which successfully engaged the IMA ostium. A Cook cantata microcatheter and a synchro soft microwire were then inserted into the proximal IMA. IMA angiogram was then performed which demonstrated patency of the superior rectal artery and its branches. There is mild hyperemia in the rectum without definite evidence of active extravasation. The microcatheter was removed and the Mickelson catheter was redirected to select the left common iliac artery. The catheter was exchanged over a Glidewire for a C2 catheter. The C2 catheter was uses likely internal iliac artery. Internal iliac angiogram was performed which demonstrated patent anterior and posterior proximal branches and in inferior rectal artery arising from the proximal anterior division. The microcatheter and wire were then used to select the inferior rectal artery proximally. Angiogram was performed which demonstrated no evidence of active extravasation. A Waltman loop was formed and the C2 catheter was used to select the right internal iliac artery.  Angiogram of the right internal iliac artery demonstrated patent anterior posterior division within inferior rectal artery arising from the proximal aspect of the anterior division. The microcatheter and wire were used to select the inferior rectal artery. Angiogram demonstrated a possible focus of extravasation arising from a more superior branch of the distal inferior rectal artery. The distal branch was selected with the microcatheter. Additional angiogram was performed which demonstrated no evidence of active extravasation spilling into the rectum, however there was hyperemia and irregularity of the vessels in this region. Therefore, Gel-Foam embolization was performed using approximately 1 mL of Gel-Foam slurry. The catheter was retracted slightly and additional angiogram was performed which demonstrated complete embolization of the distal inferior rectal artery and its branches. No persistent hyperemia was observed. The microcatheter was removed. The C2 catheter was advanced to un formed a Waltman loop and removed. A 6 French Angio-Seal was then placed and deployed without complication or change in pulses. The patient tolerated the procedure well and remained stable throughout the procedure. The patient was transferred to the floor in stable condition. IMPRESSION: 1. Possible focal extravasation versus hyperemia about a distal branch of the right inferior rectal artery. 2. Successful Gel-Foam slurry embolization of the distal right inferior rectal artery. Ruthann Cancer, MD [facility] Radiology Electronically Signed   By: Ruthann Cancer M.D.   On: 11/02/2021 16:47   IR Angiogram Pelvis Selective  Or Supraselective  Result Date: 11/02/2021 INDICATION: 65 year old male status post recent transrectal prostate biopsy presenting with hematochezia, anemia, and CT arteriogram evidence of acute hemorrhage from the rectum. EXAM: 1. Ultrasound-guided vascular access of  the right common femoral artery. 2. Abdominal aortogram. 3. Selective catheterization and angiography of the inferior mesenteric artery, superior rectal artery, bilateral internal iliac arteries, and bilateral inferior rectal arteries 4. Gel-Foam embolization of the distal right inferior rectal artery MEDICATIONS: None. ANESTHESIA/SEDATION: Moderate (conscious) sedation was employed during this procedure. A total of Versed 0 mg and Fentanyl 0 mcg was administered intravenously. Moderate Sedation Time: 0 minutes. The patient's level of consciousness and vital signs were monitored continuously by radiology nursing throughout the procedure under my direct supervision. CONTRAST:  25mL OMNIPAQUE IOHEXOL 300 MG/ML SOLN, 51mL OMNIPAQUE IOHEXOL 300 MG/ML SOLN, 45mL OMNIPAQUE IOHEXOL 300 MG/ML SOLN FLUOROSCOPY TIME:  Fluoroscopy Time: 31 minutes 12 seconds (2,778 mGy). COMPLICATIONS: None immediate. PROCEDURE: Informed consent was obtained from the patient following explanation of the procedure, risks, benefits and alternatives. The patient understands, agrees and consents for the procedure. All questions were addressed. A time out was performed prior to the initiation of the procedure. Maximal barrier sterile technique utilized including caps, mask, sterile gowns, sterile gloves, large sterile drape, hand hygiene, and Betadine prep. The right groin was prepped and draped in standard fashion. Preprocedure ultrasound evaluation demonstrated patency of the right common femoral artery. A permanent image was captured and stored in the record. Subdermal Local anesthesia was provided at the planned needle entry site with 1% lidocaine. A small skin nick was made. Under direct ultrasound visualization, the right common femoral artery was accessed with standard micropuncture technique. A limited right lower extremity angiogram was performed which demonstrated adequate puncture site for closure device use. A J wire was directed to the  abdominal aorta and exchanged for a 5 French, 11 cm vascular sheath. A RIM catheter was introduced and was attempted to be used to engage the IMA however was unsuccessful. Abdominal aortogram was then performed in a steep obliquity with a pigtail flush catheter to demonstrate the ostium of the IMA. The pigtail catheter was then exchanged for a Mickelson catheter which successfully engaged the IMA ostium. A Cook cantata microcatheter and a synchro soft microwire were then inserted into the proximal IMA. IMA angiogram was then performed which demonstrated patency of the superior rectal artery and its branches. There is mild hyperemia in the rectum without definite evidence of active extravasation. The microcatheter was removed and the Mickelson catheter was redirected to select the left common iliac artery. The catheter was exchanged over a Glidewire for a C2 catheter. The C2 catheter was uses likely internal iliac artery. Internal iliac angiogram was performed which demonstrated patent anterior and posterior proximal branches and in inferior rectal artery arising from the proximal anterior division. The microcatheter and wire were then used to select the inferior rectal artery proximally. Angiogram was performed which demonstrated no evidence of active extravasation. A Waltman loop was formed and the C2 catheter was used to select the right internal iliac artery. Angiogram of the right internal iliac artery demonstrated patent anterior posterior division within inferior rectal artery arising from the proximal aspect of the anterior division. The microcatheter and wire were used to select the inferior rectal artery. Angiogram demonstrated a possible focus of extravasation arising from a more superior branch of the distal inferior rectal artery. The distal branch was selected with the microcatheter. Additional angiogram was performed which demonstrated no evidence  of active extravasation spilling into the rectum,  however there was hyperemia and irregularity of the vessels in this region. Therefore, Gel-Foam embolization was performed using approximately 1 mL of Gel-Foam slurry. The catheter was retracted slightly and additional angiogram was performed which demonstrated complete embolization of the distal inferior rectal artery and its branches. No persistent hyperemia was observed. The microcatheter was removed. The C2 catheter was advanced to un formed a Waltman loop and removed. A 6 French Angio-Seal was then placed and deployed without complication or change in pulses. The patient tolerated the procedure well and remained stable throughout the procedure. The patient was transferred to the floor in stable condition. IMPRESSION: 1. Possible focal extravasation versus hyperemia about a distal branch of the right inferior rectal artery. 2. Successful Gel-Foam slurry embolization of the distal right inferior rectal artery. Ruthann Cancer, MD [facility] Radiology Electronically Signed   By: Ruthann Cancer M.D.   On: 11/02/2021 16:47   IR Angiogram Selective Each Additional Vessel  Result Date: 11/02/2021 INDICATION: 65 year old male status post recent transrectal prostate biopsy presenting with hematochezia, anemia, and CT arteriogram evidence of acute hemorrhage from the rectum. EXAM: 1. Ultrasound-guided vascular access of the right common femoral artery. 2. Abdominal aortogram. 3. Selective catheterization and angiography of the inferior mesenteric artery, superior rectal artery, bilateral internal iliac arteries, and bilateral inferior rectal arteries 4. Gel-Foam embolization of the distal right inferior rectal artery MEDICATIONS: None. ANESTHESIA/SEDATION: Moderate (conscious) sedation was employed during this procedure. A total of Versed 0 mg and Fentanyl 0 mcg was administered intravenously. Moderate Sedation Time: 0 minutes. The patient's level of consciousness and  vital signs were monitored continuously by radiology nursing throughout the procedure under my direct supervision. CONTRAST:  27mL OMNIPAQUE IOHEXOL 300 MG/ML SOLN, 2mL OMNIPAQUE IOHEXOL 300 MG/ML SOLN, 73mL OMNIPAQUE IOHEXOL 300 MG/ML SOLN FLUOROSCOPY TIME:  Fluoroscopy Time: 31 minutes 12 seconds (2,778 mGy). COMPLICATIONS: None immediate. PROCEDURE: Informed consent was obtained from the patient following explanation of the procedure, risks, benefits and alternatives. The patient understands, agrees and consents for the procedure. All questions were addressed. A time out was performed prior to the initiation of the procedure. Maximal barrier sterile technique utilized including caps, mask, sterile gowns, sterile gloves, large sterile drape, hand hygiene, and Betadine prep. The right groin was prepped and draped in standard fashion. Preprocedure ultrasound evaluation demonstrated patency of the right common femoral artery. A permanent image was captured and stored in the record. Subdermal Local anesthesia was provided at the planned needle entry site with 1% lidocaine. A small skin nick was made. Under direct ultrasound visualization, the right common femoral artery was accessed with standard micropuncture technique. A limited right lower extremity angiogram was performed which demonstrated adequate puncture site for closure device use. A J wire was directed to the abdominal aorta and exchanged for a 5 French, 11 cm vascular sheath. A RIM catheter was introduced and was attempted to be used to engage the IMA however was unsuccessful. Abdominal aortogram was then performed in a steep obliquity with a pigtail flush catheter to demonstrate the ostium of the IMA. The pigtail catheter was then exchanged for a Mickelson catheter which successfully engaged the IMA ostium. A Cook cantata microcatheter and a synchro soft microwire were then inserted into the proximal IMA. IMA angiogram was then performed which  demonstrated patency of the superior rectal artery and its branches. There is mild hyperemia in the rectum without definite evidence of active extravasation. The microcatheter  was removed and the Mickelson catheter was redirected to select the left common iliac artery. The catheter was exchanged over a Glidewire for a C2 catheter. The C2 catheter was uses likely internal iliac artery. Internal iliac angiogram was performed which demonstrated patent anterior and posterior proximal branches and in inferior rectal artery arising from the proximal anterior division. The microcatheter and wire were then used to select the inferior rectal artery proximally. Angiogram was performed which demonstrated no evidence of active extravasation. A Waltman loop was formed and the C2 catheter was used to select the right internal iliac artery. Angiogram of the right internal iliac artery demonstrated patent anterior posterior division within inferior rectal artery arising from the proximal aspect of the anterior division. The microcatheter and wire were used to select the inferior rectal artery. Angiogram demonstrated a possible focus of extravasation arising from a more superior branch of the distal inferior rectal artery. The distal branch was selected with the microcatheter. Additional angiogram was performed which demonstrated no evidence of active extravasation spilling into the rectum, however there was hyperemia and irregularity of the vessels in this region. Therefore, Gel-Foam embolization was performed using approximately 1 mL of Gel-Foam slurry. The catheter was retracted slightly and additional angiogram was performed which demonstrated complete embolization of the distal inferior rectal artery and its branches. No persistent hyperemia was observed. The microcatheter was removed. The C2 catheter was advanced to un formed a Waltman loop and removed. A 6 French Angio-Seal was then placed and deployed without complication or  change in pulses. The patient tolerated the procedure well and remained stable throughout the procedure. The patient was transferred to the floor in stable condition. IMPRESSION: 1. Possible focal extravasation versus hyperemia about a distal branch of the right inferior rectal artery. 2. Successful Gel-Foam slurry embolization of the distal right inferior rectal artery. Ruthann Cancer, MD [facility] Radiology Electronically Signed   By: Ruthann Cancer M.D.   On: 11/02/2021 16:47   IR Angiogram Selective Each Additional Vessel  Result Date: 11/02/2021 INDICATION: 65 year old male status post recent transrectal prostate biopsy presenting with hematochezia, anemia, and CT arteriogram evidence of acute hemorrhage from the rectum. EXAM: 1. Ultrasound-guided vascular access of the right common femoral artery. 2. Abdominal aortogram. 3. Selective catheterization and angiography of the inferior mesenteric artery, superior rectal artery, bilateral internal iliac arteries, and bilateral inferior rectal arteries 4. Gel-Foam embolization of the distal right inferior rectal artery MEDICATIONS: None. ANESTHESIA/SEDATION: Moderate (conscious) sedation was employed during this procedure. A total of Versed 0 mg and Fentanyl 0 mcg was administered intravenously. Moderate Sedation Time: 0 minutes. The patient's level of consciousness and vital signs were monitored continuously by radiology nursing throughout the procedure under my direct supervision. CONTRAST:  73mL OMNIPAQUE IOHEXOL 300 MG/ML SOLN, 50mL OMNIPAQUE IOHEXOL 300 MG/ML SOLN, 4mL OMNIPAQUE IOHEXOL 300 MG/ML SOLN FLUOROSCOPY TIME:  Fluoroscopy Time: 31 minutes 12 seconds (2,778 mGy). COMPLICATIONS: None immediate. PROCEDURE: Informed consent was obtained from the patient following explanation of the procedure, risks, benefits and alternatives. The patient understands, agrees and consents for the procedure. All questions  were addressed. A time out was performed prior to the initiation of the procedure. Maximal barrier sterile technique utilized including caps, mask, sterile gowns, sterile gloves, large sterile drape, hand hygiene, and Betadine prep. The right groin was prepped and draped in standard fashion. Preprocedure ultrasound evaluation demonstrated patency of the right common femoral artery. A permanent image was captured and stored in the record. Subdermal  Local anesthesia was provided at the planned needle entry site with 1% lidocaine. A small skin nick was made. Under direct ultrasound visualization, the right common femoral artery was accessed with standard micropuncture technique. A limited right lower extremity angiogram was performed which demonstrated adequate puncture site for closure device use. A J wire was directed to the abdominal aorta and exchanged for a 5 French, 11 cm vascular sheath. A RIM catheter was introduced and was attempted to be used to engage the IMA however was unsuccessful. Abdominal aortogram was then performed in a steep obliquity with a pigtail flush catheter to demonstrate the ostium of the IMA. The pigtail catheter was then exchanged for a Mickelson catheter which successfully engaged the IMA ostium. A Cook cantata microcatheter and a synchro soft microwire were then inserted into the proximal IMA. IMA angiogram was then performed which demonstrated patency of the superior rectal artery and its branches. There is mild hyperemia in the rectum without definite evidence of active extravasation. The microcatheter was removed and the Mickelson catheter was redirected to select the left common iliac artery. The catheter was exchanged over a Glidewire for a C2 catheter. The C2 catheter was uses likely internal iliac artery. Internal iliac angiogram was performed which demonstrated patent anterior and posterior proximal branches and in inferior rectal artery arising from the proximal anterior  division. The microcatheter and wire were then used to select the inferior rectal artery proximally. Angiogram was performed which demonstrated no evidence of active extravasation. A Waltman loop was formed and the C2 catheter was used to select the right internal iliac artery. Angiogram of the right internal iliac artery demonstrated patent anterior posterior division within inferior rectal artery arising from the proximal aspect of the anterior division. The microcatheter and wire were used to select the inferior rectal artery. Angiogram demonstrated a possible focus of extravasation arising from a more superior branch of the distal inferior rectal artery. The distal branch was selected with the microcatheter. Additional angiogram was performed which demonstrated no evidence of active extravasation spilling into the rectum, however there was hyperemia and irregularity of the vessels in this region. Therefore, Gel-Foam embolization was performed using approximately 1 mL of Gel-Foam slurry. The catheter was retracted slightly and additional angiogram was performed which demonstrated complete embolization of the distal inferior rectal artery and its branches. No persistent hyperemia was observed. The microcatheter was removed. The C2 catheter was advanced to un formed a Waltman loop and removed. A 6 French Angio-Seal was then placed and deployed without complication or change in pulses. The patient tolerated the procedure well and remained stable throughout the procedure. The patient was transferred to the floor in stable condition. IMPRESSION: 1. Possible focal extravasation versus hyperemia about a distal branch of the right inferior rectal artery. 2. Successful Gel-Foam slurry embolization of the distal right inferior rectal artery. Ruthann Cancer, MD [facility] Radiology Electronically Signed   By: Ruthann Cancer M.D.   On: 11/02/2021 16:47   IR Angiogram Selective  Each Additional Vessel  Result Date: 11/02/2021 INDICATION: 65 year old male status post recent transrectal prostate biopsy presenting with hematochezia, anemia, and CT arteriogram evidence of acute hemorrhage from the rectum. EXAM: 1. Ultrasound-guided vascular access of the right common femoral artery. 2. Abdominal aortogram. 3. Selective catheterization and angiography of the inferior mesenteric artery, superior rectal artery, bilateral internal iliac arteries, and bilateral inferior rectal arteries 4. Gel-Foam embolization of the distal right inferior rectal artery MEDICATIONS: None. ANESTHESIA/SEDATION: Moderate (conscious) sedation  was employed during this procedure. A total of Versed 0 mg and Fentanyl 0 mcg was administered intravenously. Moderate Sedation Time: 0 minutes. The patient's level of consciousness and vital signs were monitored continuously by radiology nursing throughout the procedure under my direct supervision. CONTRAST:  45mL OMNIPAQUE IOHEXOL 300 MG/ML SOLN, 70mL OMNIPAQUE IOHEXOL 300 MG/ML SOLN, 38mL OMNIPAQUE IOHEXOL 300 MG/ML SOLN FLUOROSCOPY TIME:  Fluoroscopy Time: 31 minutes 12 seconds (2,778 mGy). COMPLICATIONS: None immediate. PROCEDURE: Informed consent was obtained from the patient following explanation of the procedure, risks, benefits and alternatives. The patient understands, agrees and consents for the procedure. All questions were addressed. A time out was performed prior to the initiation of the procedure. Maximal barrier sterile technique utilized including caps, mask, sterile gowns, sterile gloves, large sterile drape, hand hygiene, and Betadine prep. The right groin was prepped and draped in standard fashion. Preprocedure ultrasound evaluation demonstrated patency of the right common femoral artery. A permanent image was captured and stored in the record. Subdermal Local anesthesia was provided at the planned needle entry site with 1% lidocaine. A small skin nick was  made. Under direct ultrasound visualization, the right common femoral artery was accessed with standard micropuncture technique. A limited right lower extremity angiogram was performed which demonstrated adequate puncture site for closure device use. A J wire was directed to the abdominal aorta and exchanged for a 5 French, 11 cm vascular sheath. A RIM catheter was introduced and was attempted to be used to engage the IMA however was unsuccessful. Abdominal aortogram was then performed in a steep obliquity with a pigtail flush catheter to demonstrate the ostium of the IMA. The pigtail catheter was then exchanged for a Mickelson catheter which successfully engaged the IMA ostium. A Cook cantata microcatheter and a synchro soft microwire were then inserted into the proximal IMA. IMA angiogram was then performed which demonstrated patency of the superior rectal artery and its branches. There is mild hyperemia in the rectum without definite evidence of active extravasation. The microcatheter was removed and the Mickelson catheter was redirected to select the left common iliac artery. The catheter was exchanged over a Glidewire for a C2 catheter. The C2 catheter was uses likely internal iliac artery. Internal iliac angiogram was performed which demonstrated patent anterior and posterior proximal branches and in inferior rectal artery arising from the proximal anterior division. The microcatheter and wire were then used to select the inferior rectal artery proximally. Angiogram was performed which demonstrated no evidence of active extravasation. A Waltman loop was formed and the C2 catheter was used to select the right internal iliac artery. Angiogram of the right internal iliac artery demonstrated patent anterior posterior division within inferior rectal artery arising from the proximal aspect of the anterior division. The microcatheter and wire were used to select the inferior rectal artery. Angiogram demonstrated a  possible focus of extravasation arising from a more superior branch of the distal inferior rectal artery. The distal branch was selected with the microcatheter. Additional angiogram was performed which demonstrated no evidence of active extravasation spilling into the rectum, however there was hyperemia and irregularity of the vessels in this region. Therefore, Gel-Foam embolization was performed using approximately 1 mL of Gel-Foam slurry. The catheter was retracted slightly and additional angiogram was performed which demonstrated complete embolization of the distal inferior rectal artery and its branches. No persistent hyperemia was observed. The microcatheter was removed. The C2 catheter was advanced to un formed a Waltman loop and removed. A 6 French Angio-Seal was  then placed and deployed without complication or change in pulses. The patient tolerated the procedure well and remained stable throughout the procedure. The patient was transferred to the floor in stable condition. IMPRESSION: 1. Possible focal extravasation versus hyperemia about a distal branch of the right inferior rectal artery. 2. Successful Gel-Foam slurry embolization of the distal right inferior rectal artery. Ruthann Cancer, MD [facility] Radiology Electronically Signed   By: Ruthann Cancer M.D.   On: 11/02/2021 16:47   IR US Guide Vasc Access Right  Result Date: 11/02/2021 INDICATION: 65 year old male status post recent transrectal prostate biopsy presenting with hematochezia, anemia, and CT arteriogram evidence of acute hemorrhage from the rectum. EXAM: 1. Ultrasound-guided vascular access of the right common femoral artery. 2. Abdominal aortogram. 3. Selective catheterization and angiography of the inferior mesenteric artery, superior rectal artery, bilateral internal iliac arteries, and bilateral inferior rectal arteries 4. Gel-Foam embolization of the distal right inferior rectal  artery MEDICATIONS: None. ANESTHESIA/SEDATION: Moderate (conscious) sedation was employed during this procedure. A total of Versed 0 mg and Fentanyl 0 mcg was administered intravenously. Moderate Sedation Time: 0 minutes. The patient's level of consciousness and vital signs were monitored continuously by radiology nursing throughout the procedure under my direct supervision. CONTRAST:  52mL OMNIPAQUE IOHEXOL 300 MG/ML SOLN, 23mL OMNIPAQUE IOHEXOL 300 MG/ML SOLN, 80mL OMNIPAQUE IOHEXOL 300 MG/ML SOLN FLUOROSCOPY TIME:  Fluoroscopy Time: 31 minutes 12 seconds (2,778 mGy). COMPLICATIONS: None immediate. PROCEDURE: Informed consent was obtained from the patient following explanation of the procedure, risks, benefits and alternatives. The patient understands, agrees and consents for the procedure. All questions were addressed. A time out was performed prior to the initiation of the procedure. Maximal barrier sterile technique utilized including caps, mask, sterile gowns, sterile gloves, large sterile drape, hand hygiene, and Betadine prep. The right groin was prepped and draped in standard fashion. Preprocedure ultrasound evaluation demonstrated patency of the right common femoral artery. A permanent image was captured and stored in the record. Subdermal Local anesthesia was provided at the planned needle entry site with 1% lidocaine. A small skin nick was made. Under direct ultrasound visualization, the right common femoral artery was accessed with standard micropuncture technique. A limited right lower extremity angiogram was performed which demonstrated adequate puncture site for closure device use. A J wire was directed to the abdominal aorta and exchanged for a 5 French, 11 cm vascular sheath. A RIM catheter was introduced and was attempted to be used to engage the IMA however was unsuccessful. Abdominal aortogram was then performed in a steep obliquity with a pigtail flush catheter to demonstrate the ostium of the  IMA. The pigtail catheter was then exchanged for a Mickelson catheter which successfully engaged the IMA ostium. A Cook cantata microcatheter and a synchro soft microwire were then inserted into the proximal IMA. IMA angiogram was then performed which demonstrated patency of the superior rectal artery and its branches. There is mild hyperemia in the rectum without definite evidence of active extravasation. The microcatheter was removed and the Mickelson catheter was redirected to select the left common iliac artery. The catheter was exchanged over a Glidewire for a C2 catheter. The C2 catheter was uses likely internal iliac artery. Internal iliac angiogram was performed which demonstrated patent anterior and posterior proximal branches and in inferior rectal artery arising from the proximal anterior division. The microcatheter and wire were then used to select the inferior rectal artery proximally. Angiogram was performed which demonstrated no evidence of active  extravasation. A Waltman loop was formed and the C2 catheter was used to select the right internal iliac artery. Angiogram of the right internal iliac artery demonstrated patent anterior posterior division within inferior rectal artery arising from the proximal aspect of the anterior division. The microcatheter and wire were used to select the inferior rectal artery. Angiogram demonstrated a possible focus of extravasation arising from a more superior branch of the distal inferior rectal artery. The distal branch was selected with the microcatheter. Additional angiogram was performed which demonstrated no evidence of active extravasation spilling into the rectum, however there was hyperemia and irregularity of the vessels in this region. Therefore, Gel-Foam embolization was performed using approximately 1 mL of Gel-Foam slurry. The catheter was retracted slightly and additional angiogram was performed which demonstrated complete embolization of the distal  inferior rectal artery and its branches. No persistent hyperemia was observed. The microcatheter was removed. The C2 catheter was advanced to un formed a Waltman loop and removed. A 6 French Angio-Seal was then placed and deployed without complication or change in pulses. The patient tolerated the procedure well and remained stable throughout the procedure. The patient was transferred to the floor in stable condition. IMPRESSION: 1. Possible focal extravasation versus hyperemia about a distal branch of the right inferior rectal artery. 2. Successful Gel-Foam slurry embolization of the distal right inferior rectal artery. Ruthann Cancer, MD [facility] Radiology Electronically Signed   By: Ruthann Cancer M.D.   On: 11/02/2021 16:47   IR EMBO ART  VEN HEMORR LYMPH EXTRAV  INC GUIDE ROADMAPPING  Result Date: 11/02/2021 INDICATION: 65 year old male status post recent transrectal prostate biopsy presenting with hematochezia, anemia, and CT arteriogram evidence of acute hemorrhage from the rectum. EXAM: 1. Ultrasound-guided vascular access of the right common femoral artery. 2. Abdominal aortogram. 3. Selective catheterization and angiography of the inferior mesenteric artery, superior rectal artery, bilateral internal iliac arteries, and bilateral inferior rectal arteries 4. Gel-Foam embolization of the distal right inferior rectal artery MEDICATIONS: None. ANESTHESIA/SEDATION: Moderate (conscious) sedation was employed during this procedure. A total of Versed 0 mg and Fentanyl 0 mcg was administered intravenously. Moderate Sedation Time: 0 minutes. The patient's level of consciousness and vital signs were monitored continuously by radiology nursing throughout the procedure under my direct supervision. CONTRAST:  67mL OMNIPAQUE IOHEXOL 300 MG/ML SOLN, 34mL OMNIPAQUE IOHEXOL 300 MG/ML SOLN, 69mL OMNIPAQUE IOHEXOL 300 MG/ML SOLN FLUOROSCOPY TIME:  Fluoroscopy Time: 31  minutes 12 seconds (2,778 mGy). COMPLICATIONS: None immediate. PROCEDURE: Informed consent was obtained from the patient following explanation of the procedure, risks, benefits and alternatives. The patient understands, agrees and consents for the procedure. All questions were addressed. A time out was performed prior to the initiation of the procedure. Maximal barrier sterile technique utilized including caps, mask, sterile gowns, sterile gloves, large sterile drape, hand hygiene, and Betadine prep. The right groin was prepped and draped in standard fashion. Preprocedure ultrasound evaluation demonstrated patency of the right common femoral artery. A permanent image was captured and stored in the record. Subdermal Local anesthesia was provided at the planned needle entry site with 1% lidocaine. A small skin nick was made. Under direct ultrasound visualization, the right common femoral artery was accessed with standard micropuncture technique. A limited right lower extremity angiogram was performed which demonstrated adequate puncture site for closure device use. A J wire was directed to the abdominal aorta and exchanged for a 5 French, 11 cm vascular sheath. A RIM catheter was introduced and  was attempted to be used to engage the IMA however was unsuccessful. Abdominal aortogram was then performed in a steep obliquity with a pigtail flush catheter to demonstrate the ostium of the IMA. The pigtail catheter was then exchanged for a Mickelson catheter which successfully engaged the IMA ostium. A Cook cantata microcatheter and a synchro soft microwire were then inserted into the proximal IMA. IMA angiogram was then performed which demonstrated patency of the superior rectal artery and its branches. There is mild hyperemia in the rectum without definite evidence of active extravasation. The microcatheter was removed and the Mickelson catheter was redirected to select the left common iliac artery. The catheter was  exchanged over a Glidewire for a C2 catheter. The C2 catheter was uses likely internal iliac artery. Internal iliac angiogram was performed which demonstrated patent anterior and posterior proximal branches and in inferior rectal artery arising from the proximal anterior division. The microcatheter and wire were then used to select the inferior rectal artery proximally. Angiogram was performed which demonstrated no evidence of active extravasation. A Waltman loop was formed and the C2 catheter was used to select the right internal iliac artery. Angiogram of the right internal iliac artery demonstrated patent anterior posterior division within inferior rectal artery arising from the proximal aspect of the anterior division. The microcatheter and wire were used to select the inferior rectal artery. Angiogram demonstrated a possible focus of extravasation arising from a more superior branch of the distal inferior rectal artery. The distal branch was selected with the microcatheter. Additional angiogram was performed which demonstrated no evidence of active extravasation spilling into the rectum, however there was hyperemia and irregularity of the vessels in this region. Therefore, Gel-Foam embolization was performed using approximately 1 mL of Gel-Foam slurry. The catheter was retracted slightly and additional angiogram was performed which demonstrated complete embolization of the distal inferior rectal artery and its branches. No persistent hyperemia was observed. The microcatheter was removed. The C2 catheter was advanced to un formed a Waltman loop and removed. A 6 French Angio-Seal was then placed and deployed without complication or change in pulses. The patient tolerated the procedure well and remained stable throughout the procedure. The patient was transferred to the floor in stable condition. IMPRESSION: 1. Possible focal extravasation versus hyperemia about a distal branch of the right inferior rectal artery.  2. Successful Gel-Foam slurry embolization of the distal right inferior rectal artery. Ruthann Cancer, MD [facility] Radiology Electronically Signed   By: Ruthann Cancer M.D.   On: 11/02/2021 16:47   CT Angio Abd/Pel w/ and/or w/o  Result Date: 11/02/2021 CLINICAL DATA:  Acute lower GI bleeding, status post prostate biopsy last week. EXAM: CT ANGIOGRAPHY ABDOMEN AND PELVIS WITH CONTRAST AND WITHOUT CONTRAST TECHNIQUE: Multidetector CT imaging of the abdomen and pelvis was performed using the standard protocol during bolus administration of intravenous contrast. Multiplanar reconstructed images and MIPs were obtained and reviewed to evaluate the vascular anatomy. CONTRAST:  187mL OMNIPAQUE IOHEXOL 350 MG/ML SOLN COMPARISON:  10/08/2020 FINDINGS: VASCULAR Aorta: Minor aortic atherosclerosis. Negative for aneurysm, dissection or occlusive process. No retroperitoneal hemorrhage or hematoma. Celiac: Minor atherosclerotic origin but remains patent including its branches SMA: Minor atherosclerotic origin but remains patent including its branches Renals: Main renal arteries remain patent. Minor atherosclerotic changes. No accessory renal artery. IMA: Remains patent off the distal aorta anteriorly including its branches. Lower rectum active contrast extravasation on the arterial phase which progresses on the delayed portal venous phase imaging compatible  with acute active lower rectal GI bleeding. Inflow: Iliac atherosclerosis noted but no inflow disease or occlusion. Mild iliac tortuosity. Common, internal and external iliac arteries all remain patent. Proximal Outflow: Common femoral, proximal profunda femoral, proximal superficial femoral arteries all remain patent. Veins: No veno-occlusive process. Review of the MIP images confirms the above findings. NON-VASCULAR Lower chest: No acute abnormality. Hepatobiliary: No focal hepatic abnormality. Small left hepatic lobe  noted. No biliary obstruction pattern dilatation. Patent portal and hepatic veins. Small gallstones noted. Common bile duct nondilated. Pancreas: Unremarkable. No pancreatic ductal dilatation or surrounding inflammatory changes. Spleen: Normal in size without focal abnormality. Adrenals/Urinary Tract: Adrenal glands are unremarkable. Kidneys are normal, without renal calculi, focal lesion, or hydronephrosis. Bladder is unremarkable. Stomach/Bowel: Postop changes from gastric bypass. Negative for bowel obstruction, significant dilatation, ileus, or free air. Appendix not visualized. No free fluid, fluid collection, abscess or ascites. Lymphatic: No bulky adenopathy. Reproductive: No other significant finding by CT. Other: No abdominal wall hernia or abnormality. No abdominopelvic ascites. Musculoskeletal: Degenerative changes throughout the spine. Multilevel lower lumbar facet arthropathy. No acute osseous finding. IMPRESSION: VASCULAR Positive CTA for acute lower rectal GI bleeding. These results were called by telephone at the time of interpretation on 11/02/2021 at 11:16 am to provider AMY ESTERWOOD , who verbally acknowledged these results. NON-VASCULAR Remote gastric bypass surgery Incidental cholelithiasis Electronically Signed   By: Jerilynn Mages.  Shick M.D.   On: 11/02/2021 11:21       Assessment / Plan:    #67 65 year old African-American male with acute major lower GI hemorrhage, at site of recent transrectal prostate biopsy, done 10/26/2021. Patient required IR Gelfoam slurry embolization yesterday afternoon of distal right inferior rectal artery.  No active bleeding overnight, hemoglobin has drifted to 6.8.  Suspect this is secondary to equilibration after acute major bleed.  #2 acute severe anemia-secondary to above/acute blood loss-patient is a [employer] and declines blood transfusion He is interested in iron infusion or other blood stimulating product  #3 new diagnosis of early stage prostate  CA #4 AKI-receiving bolus this morning  Plan; full liquid diet Continue to trend hemoglobin Will ask hematology to consult-, with single episode of pulmonary embolism in 2008 which occurred post ankle surgery, not certain that he needs to stay on lifelong Coumadin, and perhaps could discontinue Would also like hematology advice regarding synthetic products to stimulate RBC. He is not iron deficient and I do not think that iron infusion is indicated. Long discussion with patient and family today regarding details of events procedures etc. yesterday.  Fortunately he has stabilized.  I explained that there is no blood product short of blood transfusion that will improve his blood counts in the short-term.     Principal Problem:   GIB (gastrointestinal bleeding)     LOS: 0 days   Amy Esterwood PA-C 11/03/2021, 10:18 AM  GI ATTENDING  Interval history data reviewed.  Agree with interval progress note as outlined above.  Appreciate IR assistance.  Bleeding related to urologic intervention has ceased.  Agree with addressing the need for long-term anticoagulation as suggested.  Continue to monitor.  Docia Chuck. Geri Seminole., M.D. [facility] Division of Gastroenterology

## 2021-11-03 NOTE — Anesthesia Procedure Notes (Signed)
Procedure Name: MAC Date/Time: 11/03/2021 7:36 PM Performed by: Cynda Familia, CRNA Pre-anesthesia Checklist: Patient identified, Emergency Drugs available, Suction available, Patient being monitored and Timeout performed Patient Re-evaluated:Patient Re-evaluated prior to induction Oxygen Delivery Method: Simple face mask Placement Confirmation: positive ETCO2 and breath sounds checked- equal and bilateral Dental Injury: Teeth and Oropharynx as per pre-operative assessment  Comments: Chipped front teeth and permanent upper bridge

## 2021-11-03 NOTE — Consult Note (Addendum)
[facility]  [phone] [phone]    INITIAL HEMATOLOGY CONSULTATION  Referring MD:  Nicoletta Ba, PA-C  Reason for Referral: Anemia secondary to rectal bleeding following prostate biopsy, Jehovah's Witness, history of pulmonary embolism  HPI: [name] is a 65 year old male with a past medical history significant for diabetes mellitus, hypertension, history of PE on Coumadin, history of Roux-en-Y gastric bypass in 2014. He presented to the emergency department with rectal bleeding.  He was having a large amount of bright red blood per rectum.  He had a recent prostate biopsy and has been having some hematuria.  The patient has been on Coumadin since his initial diagnosis of PE around 2008.  The Coumadin was held for several days prior to his prostate biopsy and he was bridged with Lovenox.  For the 3 days prior to admission, he was on both Coumadin and Lovenox.  Coumadin and Lovenox currently on hold. On admission, his hemoglobin was 11.5, MCV 72.2, BUN 31, creatinine 1.29, PT 18.4, INR 1.5, iron, TIBC, percent saturation were all normal.  His hemoglobin has dropped down to 6.8 this morning.  The patient declines blood transfusions due to religious reasons.  He underwent embolization of the distal right inferior rectal artery by IR late yesterday afternoon.  He received a dose of vitamin K 5 mg IV on 11/02/2021.  I met with the patient in his hospital room.  He had several family numbers at the bedside.  He had some blood clots on his pad overnight but no further bleeding noted this morning.  He denies any other bleeding such as epistaxis, hemoptysis, hematemesis, and his hematuria has resolved.  He is not currently having any headaches, dizziness, chest pain, shortness of breath, abdominal pain.  The patient tells me that he had bilateral PE in 2007.  States that this developed following ankle surgery when he was more sedentary.  He has been on Coumadin since  this time.  He has not had any other clot such as DVT or recurrent PE.  He states that he had a sister that had a blood clot but no other family members with VTE.  I informed with the patient that he is not excepting of blood products due to religious reasons.  Hematology was asked see the patient for recommendations regarding anticoagulation management and management of his anemia.  Past Medical History:  Diagnosis Date   Arthritis    BACK PAIN    CAD (coronary artery disease) 06/28/2017   CAD in native artery    a. moderate by cardiac CT 2016.   Chronic bilateral low back pain with bilateral sciatica 01/12/2019   Degenerative arthritis of right knee 07/15/2016   Injected in 07/15/2016 Orthovisc injection started 10/07/2016 DepoMedrol 40 inj on 04/11/17, 25 cc aspirated Orthovisc injection in 05/30/2017     DEGENERATIVE JOINT DISEASE, KNEES, BILATERAL    Depression    Diabetes ([place]) 10/01/2014   Diabetes mellitus    type II   DIVERTICULOSIS, COLON    DVT, lower extremity ([place])    right   History of kidney stones    Hyperlipidemia    Hypertension    HYPOGONADISM, MALE    HYPOTENSION, ORTHOSTATIC    Kidney stones    Long term (current) use of anticoagulants 08/17/2017   Long term current use of anticoagulant    Lumbosacral radiculopathy at S1 06/14/2012   Maxillary sinus cyst 12/11/2018   Obesity    OBESITY, MORBID 03/04/2008   Qualifier:  Diagnosis of  By: Jenny Reichmann MD, Hunt Oris    OSA (obstructive sleep apnea) 09/11/2013   Pulmonary emboli (HCC)    Pulmonary embolism ([place]) 02/05/2011   RBBB 07/28/2017   Renal insufficiency    a. prior h/o, does not appear chronic.   Superficial phlebitis of arm 03/18/2016   Vitamin D deficiency 07/09/2020  :     Past Surgical History:  Procedure Laterality Date   ankle surgury  2001 bilateral   with bone spurs and torn tendon   COLONOSCOPY  03/18/2010   EDG  2008   chronic Duodenitis   GASTRIC BYPASS     HEEL SPUR SURGERY Bilateral 2005   IR  ANGIOGRAM PELVIS SELECTIVE OR SUPRASELECTIVE  11/02/2021   IR ANGIOGRAM SELECTIVE EACH ADDITIONAL VESSEL  11/02/2021   IR ANGIOGRAM SELECTIVE EACH ADDITIONAL VESSEL  11/02/2021   IR ANGIOGRAM SELECTIVE EACH ADDITIONAL VESSEL  11/02/2021   IR ANGIOGRAM VISCERAL SELECTIVE  11/02/2021   IR EMBO ART  VEN HEMORR LYMPH EXTRAV  INC GUIDE ROADMAPPING  11/02/2021   IR US GUIDE VASC ACCESS RIGHT  11/02/2021   knee surgury  2000 & 2003    cartilage damage   LEFT HEART CATH AND CORONARY ANGIOGRAPHY N/A 02/16/2021   Procedure: LEFT HEART CATH AND CORONARY ANGIOGRAPHY;  Surgeon: Sherren Mocha, MD;  Location: [facility];  Service: Cardiovascular;  Laterality: N/A;   LUMBAR LAMINECTOMY/DECOMPRESSION MICRODISCECTOMY  09/28/2012   Procedure: LUMBAR LAMINECTOMY/DECOMPRESSION MICRODISCECTOMY 2 LEVELS;  Surgeon: [name], MD;  Location: [facility];  Service: Orthopedics;  Laterality: Left;  Decompressive laminectomy L4-L5. Microdiscectomy L5-S1   Skin removal surgery     extra abdominal skin removed from his weight loss   TONSILLECTOMY AND ADENOIDECTOMY  age 64 or 6   TOTAL KNEE ARTHROPLASTY  01/10/2012   Procedure: TOTAL KNEE ARTHROPLASTY;  Surgeon: [name], MD;  Location: [facility];  Service: Orthopedics;  Laterality: Left;   TOTAL KNEE ARTHROPLASTY Right 11/18/2020   Procedure: TOTAL KNEE ARTHROPLASTY;  Surgeon: Paralee Cancel, MD;  Location: [facility];  Service: Orthopedics;  Laterality: Right;  70 mins   UPPER GASTROINTESTINAL ENDOSCOPY  03/18/2010  :   CURRENT MEDS: Current Facility-Administered Medications  Medication Dose Route Frequency Provider Last Rate Last Admin   0.9 %  sodium chloride infusion   Intravenous Continuous Esterwood, Amy S, PA-C 125 mL/hr at 11/02/21 1757 New Bag at 11/02/21 1757   atorvastatin (LIPITOR) tablet 80 mg  80 mg Oral QHS Kyle, Tyrone A, DO   80 mg at 11/02/21 2047   escitalopram (LEXAPRO) tablet 10 mg  10 mg Oral Daily Kyle, Tyrone A, DO       gabapentin  (NEURONTIN) capsule 200 mg  200 mg Oral QHS Kyle, Tyrone A, DO   200 mg at 11/02/21 2047   hydrALAZINE (APRESOLINE) injection 10 mg  10 mg Intravenous Q8H PRN Marylyn Ishihara, Tyrone A, DO       insulin aspart (novoLOG) injection 0-9 Units  0-9 Units Subcutaneous Q6H Eugenie Filler, MD       morphine 2 MG/ML injection 1 mg  1 mg Intravenous Q4H PRN Marylyn Ishihara, Tyrone A, DO   1 mg at 11/02/21 1622   ondansetron (ZOFRAN) injection 4 mg  4 mg Intravenous Q8H PRN Marylyn Ishihara, Tyrone A, DO   4 mg at 11/02/21 2046   pantoprazole (PROTONIX) injection 40 mg  40 mg Intravenous Q12H Kyle, Tyrone A, DO   40 mg at 11/02/21 2047   polyvinyl alcohol (LIQUIFILM  TEARS) 1.4 % ophthalmic solution 1 drop  1 drop Both Eyes Q4H PRN Marylyn Ishihara, Tyrone A, DO       sodium phosphate (FLEET) 7-19 GM/118ML enema 1 enema  1 enema Rectal Once Esterwood, Amy S, PA-C           Allergies  Allergen Reactions   Adhesive [Tape] Other (See Comments)    Painful, Please use "paper" tape   Other     NO BLOOD -JEHOVAH'S WITNESS-SIGNED REFUSAL BUT WOULD TAKE ALBUMIN  :   Family History  Problem Relation Age of Onset   Hypertension Mother    Prostate [name] Father    Diabetes Sister        died with DM 26-Jan-2001, 2 more sisters with diabetes   Heart failure Sister    Diabetes Brother    Diabetes Sister    Diabetes Sister    Colon [name] Neg Hx    Colon polyps Neg Hx    Esophageal [name] Neg Hx    Rectal [name] Neg Hx    Stomach [name] Neg Hx   :   Social History   Socioeconomic History   Marital status: Married    Spouse name: Not on file   Number of children: 2   Years of education: Not on file   Highest education level: High school graduate  Occupational History   Occupation: Retired    Fish farm manager: [employer]  Tobacco Use   Smoking status: Former    Packs/day: 0.25    Years: 5.00    Pack years: 1.25    Types: Cigarettes    Quit date: 12/06/1978    Years since quitting: 42.9   Smokeless tobacco: Never  Vaping Use   Vaping Use: Never  used  Substance and Sexual Activity   Alcohol use: Yes    Alcohol/week: 2.0 standard drinks    Types: 2 Standard drinks or equivalent per week   Drug use: No   Sexual activity: Not Currently  Other Topics Concern   Not on file  Social History Narrative   Daily caffeine use one per day   Protein drinks    Children; a friend and sister that helps   Brother that is around Legrand [place])   Son and dtr in [place]   Lives w/ wife in [place]   Left handed     Retired from [employer]   Former smoker 2 alcoholic beverages daily no caffeine no drug use no other tobacco at this time   Social Determinants of Radio broadcast assistant Strain: Not on file  Food Insecurity: Not on file  Transportation Needs: Not on file  Physical Activity: Not on file  Stress: Not on file  Social Connections: Not on file  Intimate Partner Violence: Not on file  :  REVIEW OF SYSTEMS:  A comprehensive 14 point review of systems was negative except as noted in the HPI.    Exam: Patient Vitals for the past 24 hrs:  BP Temp Temp src Pulse Resp SpO2 Height  11/03/21 0900 111/62 98.9 F (37.2 C) Oral 78 16 100 % --  11/03/21 0250 118/73 98.2 F (36.8 C) -- 87 16 99 % --  11/03/21 0133 111/68 98.2 F (36.8 C) -- 73 14 100 % --  11/02/21 2246 95/63 98.7 F (37.1 C) -- 94 14 100 % --  11/02/21 01/27/2144 (!) 94/59 (!) 96.9 F (36.1 C) -- 91 14 100 % --  11/02/21 January 26, 2034 90/60 (!) 97 F (36.1 C) -- 93 16  100 % --  11/02/21 1955 115/74 97.8 F (36.6 C) -- 89 14 100 % --  11/02/21 1753 (!) 93/59 -- -- 92 -- 100 % --  11/02/21 1633 90/60 -- -- 68 18 99 % --  11/02/21 1607 113/66 -- -- 75 14 100 % --  11/02/21 1536 112/66 97.9 F (36.6 C) -- 84 16 100 % --  11/02/21 1500 108/68 -- -- 81 18 100 % --  11/02/21 1455 102/72 -- -- 82 19 100 % --  11/02/21 1450 103/61 -- -- 82 19 100 % --  11/02/21 1445 103/67 -- -- 80 (!) 22 100 % --  11/02/21 1440 103/72 -- -- 78 17 100 % --  11/02/21 1435 109/69 -- -- 82 17 100 %  --  11/02/21 1430 107/71 -- -- 73 17 100 % --  11/02/21 1425 103/69 -- -- 74 18 99 % --  11/02/21 1420 95/66 -- -- 73 (!) 23 99 % --  11/02/21 1415 94/60 -- -- 74 20 99 % --  11/02/21 1410 97/65 -- -- 74 19 100 % --  11/02/21 1405 110/70 -- -- 71 18 99 % --  11/02/21 1400 101/70 -- -- 64 15 100 % --  11/02/21 1355 97/68 -- -- (!) 59 17 100 % --  11/02/21 1350 92/67 -- -- 61 17 100 % --  11/02/21 1345 104/61 -- -- 60 18 100 % --  11/02/21 1341 (!) 84/51 -- -- 61 20 100 % --  11/02/21 1335 96/67 -- -- 72 18 100 % --  11/02/21 1320 94/67 -- -- -- -- -- --  11/02/21 1317 (!) 79/54 -- -- -- -- -- --  11/02/21 1315 (!) 65/46 -- -- 71 15 100 % --  11/02/21 1119 96/72 (!) 97.5 F (36.4 C) Oral (!) 58 18 100 % 6' 2" (1.88 m)    General:  well-nourished in no acute distress.   Eyes: Conjunctiva pale, no scleral icterus ENT:  There were no oropharyngeal lesions.   Lymphatics:  Negative cervical, supraclavicular or axillary adenopathy.   Respiratory: lungs were clear bilaterally without wheezing or crackles.   Cardiovascular:  Regular rate and rhythm, S1/S2, without murmur, rub or gallop.  There was no pedal edema.   GI:  abdomen was soft, flat, nontender, nondistended, without organomegaly.     Skin exam was without ecchymosis, petechiae.   Neuro exam was nonfocal.  Patient was alert and oriented.  Attention was good.   Language was appropriate.  Mood was normal without depression.  Speech was not pressured.  Thought content was not tangential.    LABS:  Lab Results  Component Value Date   WBC 7.8 11/03/2021   HGB 6.8 (LL) 11/03/2021   HCT 22.2 (L) 11/03/2021   PLT 146 (L) 11/03/2021   GLUCOSE 168 (H) 11/03/2021   CHOL 116 09/11/2021   TRIG 58.0 09/11/2021   HDL 47.20 09/11/2021   LDLDIRECT 116.0 01/15/2020   LDLCALC 58 09/11/2021   ALT 27 11/03/2021   AST 28 11/03/2021   NA 134 (L) 11/03/2021   K 4.5 11/03/2021   CL 107 11/03/2021   CREATININE 1.67 (H) 11/03/2021   BUN 36 (H)  11/03/2021   CO2 21 (L) 11/03/2021   PSA 5.05 (H) 09/11/2021   INR 1.5 (H) 11/02/2021   HGBA1C 7.2 (H) 09/11/2021   MICROALBUR 1.6 03/10/2021    DG Lumbar Spine 2-3 Views  Result Date: 10/22/2021 CLINICAL DATA:  Lumbar spine pain. EXAM: LUMBAR SPINE -  2-3 VIEW COMPARISON:  Lumbar spine x-ray 11/22/2017 FINDINGS: Alignment is anatomic. There is no evidence for acute fracture. There is moderate disc space narrowing at L4-L5 and L5-S1 which is similar to the prior study. There are atherosclerotic calcifications of the aorta. There is a surgical clip in the left abdomen. There is a large amount of stool in the distal colon and rectum. IMPRESSION: Stable moderate degenerative changes at L4-L5 and L5-S1. Large amount of stool in the distal colon and rectum. Electronically Signed   By: Ronney Asters M.D.   On: 10/22/2021 20:43   IR Angiogram Visceral Selective  Result Date: 11/02/2021 INDICATION: 65 year old male status post recent transrectal prostate biopsy presenting with hematochezia, anemia, and CT arteriogram evidence of acute hemorrhage from the rectum. EXAM: 1. Ultrasound-guided vascular access of the right common femoral artery. 2. Abdominal aortogram. 3. Selective catheterization and angiography of the inferior mesenteric artery, superior rectal artery, bilateral internal iliac arteries, and bilateral inferior rectal arteries 4. Gel-Foam embolization of the distal right inferior rectal artery MEDICATIONS: None. ANESTHESIA/SEDATION: Moderate (conscious) sedation was employed during this procedure. A total of Versed 0 mg and Fentanyl 0 mcg was administered intravenously. Moderate Sedation Time: 0 minutes. The patient's level of consciousness and vital signs were monitored continuously by radiology nursing throughout the procedure under my direct supervision. CONTRAST:  19m OMNIPAQUE IOHEXOL 300 MG/ML SOLN, 563mOMNIPAQUE IOHEXOL 300 MG/ML SOLN, 2538mMNIPAQUE IOHEXOL 300 MG/ML SOLN FLUOROSCOPY  TIME:  Fluoroscopy Time: 31 minutes 12 seconds (2,778 mGy). COMPLICATIONS: None immediate. PROCEDURE: Informed consent was obtained from the patient following explanation of the procedure, risks, benefits and alternatives. The patient understands, agrees and consents for the procedure. All questions were addressed. A time out was performed prior to the initiation of the procedure. Maximal barrier sterile technique utilized including caps, mask, sterile gowns, sterile gloves, large sterile drape, hand hygiene, and Betadine prep. The right groin was prepped and draped in standard fashion. Preprocedure ultrasound evaluation demonstrated patency of the right common femoral artery. A permanent image was captured and stored in the record. Subdermal Local anesthesia was provided at the planned needle entry site with 1% lidocaine. A small skin nick was made. Under direct ultrasound visualization, the right common femoral artery was accessed with standard micropuncture technique. A limited right lower extremity angiogram was performed which demonstrated adequate puncture site for closure device use. A J wire was directed to the abdominal aorta and exchanged for a 5 French, 11 cm vascular sheath. A RIM catheter was introduced and was attempted to be used to engage the IMA however was unsuccessful. Abdominal aortogram was then performed in a steep obliquity with a pigtail flush catheter to demonstrate the ostium of the IMA. The pigtail catheter was then exchanged for a Mickelson catheter which successfully engaged the IMA ostium. A Cook cantata microcatheter and a synchro soft microwire were then inserted into the proximal IMA. IMA angiogram was then performed which demonstrated patency of the superior rectal artery and its branches. There is mild hyperemia in the rectum without definite evidence of active extravasation. The microcatheter was removed and the Mickelson catheter was redirected to select the left common iliac  artery. The catheter was exchanged over a Glidewire for a C2 catheter. The C2 catheter was uses likely internal iliac artery. Internal iliac angiogram was performed which demonstrated patent anterior and posterior proximal branches and in inferior rectal artery arising from the proximal anterior division. The microcatheter and wire were then used to select the inferior rectal  artery proximally. Angiogram was performed which demonstrated no evidence of active extravasation. A Waltman loop was formed and the C2 catheter was used to select the right internal iliac artery. Angiogram of the right internal iliac artery demonstrated patent anterior posterior division within inferior rectal artery arising from the proximal aspect of the anterior division. The microcatheter and wire were used to select the inferior rectal artery. Angiogram demonstrated a possible focus of extravasation arising from a more superior branch of the distal inferior rectal artery. The distal branch was selected with the microcatheter. Additional angiogram was performed which demonstrated no evidence of active extravasation spilling into the rectum, however there was hyperemia and irregularity of the vessels in this region. Therefore, Gel-Foam embolization was performed using approximately 1 mL of Gel-Foam slurry. The catheter was retracted slightly and additional angiogram was performed which demonstrated complete embolization of the distal inferior rectal artery and its branches. No persistent hyperemia was observed. The microcatheter was removed. The C2 catheter was advanced to un formed a Waltman loop and removed. A 6 French Angio-Seal was then placed and deployed without complication or change in pulses. The patient tolerated the procedure well and remained stable throughout the procedure. The patient was transferred to the floor in stable condition. IMPRESSION: 1. Possible focal extravasation versus hyperemia about a distal branch of the  right inferior rectal artery. 2. Successful Gel-Foam slurry embolization of the distal right inferior rectal artery. [name] Cancer, MD [facility] Electronically Signed   By: [name] M.D.   On: 11/02/2021 16:47   IR Angiogram Pelvis Selective Or Supraselective  Result Date: 11/02/2021 INDICATION: 65 year old male status post recent transrectal prostate biopsy presenting with hematochezia, anemia, and CT arteriogram evidence of acute hemorrhage from the rectum. EXAM: 1. Ultrasound-guided vascular access of the right common femoral artery. 2. Abdominal aortogram. 3. Selective catheterization and angiography of the inferior mesenteric artery, superior rectal artery, bilateral internal iliac arteries, and bilateral inferior rectal arteries 4. Gel-Foam embolization of the distal right inferior rectal artery MEDICATIONS: None. ANESTHESIA/SEDATION: Moderate (conscious) sedation was employed during this procedure. A total of Versed 0 mg and Fentanyl 0 mcg was administered intravenously. Moderate Sedation Time: 0 minutes. The patient's level of consciousness and vital signs were monitored continuously by radiology nursing throughout the procedure under my direct supervision. CONTRAST:  28m OMNIPAQUE IOHEXOL 300 MG/ML SOLN, 563mOMNIPAQUE IOHEXOL 300 MG/ML SOLN, 2529mMNIPAQUE IOHEXOL 300 MG/ML SOLN FLUOROSCOPY TIME:  Fluoroscopy Time: 31 minutes 12 seconds (2,778 mGy). COMPLICATIONS: None immediate. PROCEDURE: Informed consent was obtained from the patient following explanation of the procedure, risks, benefits and alternatives. The patient understands, agrees and consents for the procedure. All questions were addressed. A time out was performed prior to the initiation of the procedure. Maximal barrier sterile technique utilized including caps, mask, sterile gowns, sterile gloves, large sterile drape, hand hygiene, and Betadine prep. The right groin was  prepped and draped in standard fashion. Preprocedure ultrasound evaluation demonstrated patency of the right common femoral artery. A permanent image was captured and stored in the record. Subdermal Local anesthesia was provided at the planned needle entry site with 1% lidocaine. A small skin nick was made. Under direct ultrasound visualization, the right common femoral artery was accessed with standard micropuncture technique. A limited right lower extremity angiogram was performed which demonstrated adequate puncture site for closure device use. A J wire was directed to the abdominal aorta and exchanged for a 5 French, 11 cm vascular sheath. A  RIM catheter was introduced and was attempted to be used to engage the IMA however was unsuccessful. Abdominal aortogram was then performed in a steep obliquity with a pigtail flush catheter to demonstrate the ostium of the IMA. The pigtail catheter was then exchanged for a Mickelson catheter which successfully engaged the IMA ostium. A Cook cantata microcatheter and a synchro soft microwire were then inserted into the proximal IMA. IMA angiogram was then performed which demonstrated patency of the superior rectal artery and its branches. There is mild hyperemia in the rectum without definite evidence of active extravasation. The microcatheter was removed and the Mickelson catheter was redirected to select the left common iliac artery. The catheter was exchanged over a Glidewire for a C2 catheter. The C2 catheter was uses likely internal iliac artery. Internal iliac angiogram was performed which demonstrated patent anterior and posterior proximal branches and in inferior rectal artery arising from the proximal anterior division. The microcatheter and wire were then used to select the inferior rectal artery proximally. Angiogram was performed which demonstrated no evidence of active extravasation. A Waltman loop was formed and the C2 catheter was used to select the right  internal iliac artery. Angiogram of the right internal iliac artery demonstrated patent anterior posterior division within inferior rectal artery arising from the proximal aspect of the anterior division. The microcatheter and wire were used to select the inferior rectal artery. Angiogram demonstrated a possible focus of extravasation arising from a more superior branch of the distal inferior rectal artery. The distal branch was selected with the microcatheter. Additional angiogram was performed which demonstrated no evidence of active extravasation spilling into the rectum, however there was hyperemia and irregularity of the vessels in this region. Therefore, Gel-Foam embolization was performed using approximately 1 mL of Gel-Foam slurry. The catheter was retracted slightly and additional angiogram was performed which demonstrated complete embolization of the distal inferior rectal artery and its branches. No persistent hyperemia was observed. The microcatheter was removed. The C2 catheter was advanced to un formed a Waltman loop and removed. A 6 French Angio-Seal was then placed and deployed without complication or change in pulses. The patient tolerated the procedure well and remained stable throughout the procedure. The patient was transferred to the floor in stable condition. IMPRESSION: 1. Possible focal extravasation versus hyperemia about a distal branch of the right inferior rectal artery. 2. Successful Gel-Foam slurry embolization of the distal right inferior rectal artery. [name] Cancer, MD [facility] Radiology Electronically Signed   By: [name] M.D.   On: 11/02/2021 16:47   IR Angiogram Selective Each Additional Vessel  Result Date: 11/02/2021 INDICATION: 65 year old male status post recent transrectal prostate biopsy presenting with hematochezia, anemia, and CT arteriogram evidence of acute hemorrhage from the rectum. EXAM: 1.  Ultrasound-guided vascular access of the right common femoral artery. 2. Abdominal aortogram. 3. Selective catheterization and angiography of the inferior mesenteric artery, superior rectal artery, bilateral internal iliac arteries, and bilateral inferior rectal arteries 4. Gel-Foam embolization of the distal right inferior rectal artery MEDICATIONS: None. ANESTHESIA/SEDATION: Moderate (conscious) sedation was employed during this procedure. A total of Versed 0 mg and Fentanyl 0 mcg was administered intravenously. Moderate Sedation Time: 0 minutes. The patient's level of consciousness and vital signs were monitored continuously by radiology nursing throughout the procedure under my direct supervision. CONTRAST:  38m OMNIPAQUE IOHEXOL 300 MG/ML SOLN, 568mOMNIPAQUE IOHEXOL 300 MG/ML SOLN, 2533mMNIPAQUE IOHEXOL 300 MG/ML SOLN FLUOROSCOPY TIME:  Fluoroscopy Time: 31 minutes  12 seconds (2,778 mGy). COMPLICATIONS: None immediate. PROCEDURE: Informed consent was obtained from the patient following explanation of the procedure, risks, benefits and alternatives. The patient understands, agrees and consents for the procedure. All questions were addressed. A time out was performed prior to the initiation of the procedure. Maximal barrier sterile technique utilized including caps, mask, sterile gowns, sterile gloves, large sterile drape, hand hygiene, and Betadine prep. The right groin was prepped and draped in standard fashion. Preprocedure ultrasound evaluation demonstrated patency of the right common femoral artery. A permanent image was captured and stored in the record. Subdermal Local anesthesia was provided at the planned needle entry site with 1% lidocaine. A small skin nick was made. Under direct ultrasound visualization, the right common femoral artery was accessed with standard micropuncture technique. A limited right lower extremity angiogram was performed which demonstrated adequate puncture site for closure  device use. A J wire was directed to the abdominal aorta and exchanged for a 5 French, 11 cm vascular sheath. A RIM catheter was introduced and was attempted to be used to engage the IMA however was unsuccessful. Abdominal aortogram was then performed in a steep obliquity with a pigtail flush catheter to demonstrate the ostium of the IMA. The pigtail catheter was then exchanged for a Mickelson catheter which successfully engaged the IMA ostium. A Cook cantata microcatheter and a synchro soft microwire were then inserted into the proximal IMA. IMA angiogram was then performed which demonstrated patency of the superior rectal artery and its branches. There is mild hyperemia in the rectum without definite evidence of active extravasation. The microcatheter was removed and the Mickelson catheter was redirected to select the left common iliac artery. The catheter was exchanged over a Glidewire for a C2 catheter. The C2 catheter was uses likely internal iliac artery. Internal iliac angiogram was performed which demonstrated patent anterior and posterior proximal branches and in inferior rectal artery arising from the proximal anterior division. The microcatheter and wire were then used to select the inferior rectal artery proximally. Angiogram was performed which demonstrated no evidence of active extravasation. A Waltman loop was formed and the C2 catheter was used to select the right internal iliac artery. Angiogram of the right internal iliac artery demonstrated patent anterior posterior division within inferior rectal artery arising from the proximal aspect of the anterior division. The microcatheter and wire were used to select the inferior rectal artery. Angiogram demonstrated a possible focus of extravasation arising from a more superior branch of the distal inferior rectal artery. The distal branch was selected with the microcatheter. Additional angiogram was performed which demonstrated no evidence of active  extravasation spilling into the rectum, however there was hyperemia and irregularity of the vessels in this region. Therefore, Gel-Foam embolization was performed using approximately 1 mL of Gel-Foam slurry. The catheter was retracted slightly and additional angiogram was performed which demonstrated complete embolization of the distal inferior rectal artery and its branches. No persistent hyperemia was observed. The microcatheter was removed. The C2 catheter was advanced to un formed a Waltman loop and removed. A 6 French Angio-Seal was then placed and deployed without complication or change in pulses. The patient tolerated the procedure well and remained stable throughout the procedure. The patient was transferred to the floor in stable condition. IMPRESSION: 1. Possible focal extravasation versus hyperemia about a distal branch of the right inferior rectal artery. 2. Successful Gel-Foam slurry embolization of the distal right inferior rectal artery. [name] Cancer, MD [facility] Radiology  Electronically Signed   By: [name] M.D.   On: 11/02/2021 16:47   IR Angiogram Selective Each Additional Vessel  Result Date: 11/02/2021 INDICATION: 65 year old male status post recent transrectal prostate biopsy presenting with hematochezia, anemia, and CT arteriogram evidence of acute hemorrhage from the rectum. EXAM: 1. Ultrasound-guided vascular access of the right common femoral artery. 2. Abdominal aortogram. 3. Selective catheterization and angiography of the inferior mesenteric artery, superior rectal artery, bilateral internal iliac arteries, and bilateral inferior rectal arteries 4. Gel-Foam embolization of the distal right inferior rectal artery MEDICATIONS: None. ANESTHESIA/SEDATION: Moderate (conscious) sedation was employed during this procedure. A total of Versed 0 mg and Fentanyl 0 mcg was administered intravenously. Moderate Sedation Time: 0 minutes. The  patient's level of consciousness and vital signs were monitored continuously by radiology nursing throughout the procedure under my direct supervision. CONTRAST:  64m OMNIPAQUE IOHEXOL 300 MG/ML SOLN, 529mOMNIPAQUE IOHEXOL 300 MG/ML SOLN, 2557mMNIPAQUE IOHEXOL 300 MG/ML SOLN FLUOROSCOPY TIME:  Fluoroscopy Time: 31 minutes 12 seconds (2,778 mGy). COMPLICATIONS: None immediate. PROCEDURE: Informed consent was obtained from the patient following explanation of the procedure, risks, benefits and alternatives. The patient understands, agrees and consents for the procedure. All questions were addressed. A time out was performed prior to the initiation of the procedure. Maximal barrier sterile technique utilized including caps, mask, sterile gowns, sterile gloves, large sterile drape, hand hygiene, and Betadine prep. The right groin was prepped and draped in standard fashion. Preprocedure ultrasound evaluation demonstrated patency of the right common femoral artery. A permanent image was captured and stored in the record. Subdermal Local anesthesia was provided at the planned needle entry site with 1% lidocaine. A small skin nick was made. Under direct ultrasound visualization, the right common femoral artery was accessed with standard micropuncture technique. A limited right lower extremity angiogram was performed which demonstrated adequate puncture site for closure device use. A J wire was directed to the abdominal aorta and exchanged for a 5 French, 11 cm vascular sheath. A RIM catheter was introduced and was attempted to be used to engage the IMA however was unsuccessful. Abdominal aortogram was then performed in a steep obliquity with a pigtail flush catheter to demonstrate the ostium of the IMA. The pigtail catheter was then exchanged for a Mickelson catheter which successfully engaged the IMA ostium. A Cook cantata microcatheter and a synchro soft microwire were then inserted into the proximal IMA. IMA angiogram  was then performed which demonstrated patency of the superior rectal artery and its branches. There is mild hyperemia in the rectum without definite evidence of active extravasation. The microcatheter was removed and the Mickelson catheter was redirected to select the left common iliac artery. The catheter was exchanged over a Glidewire for a C2 catheter. The C2 catheter was uses likely internal iliac artery. Internal iliac angiogram was performed which demonstrated patent anterior and posterior proximal branches and in inferior rectal artery arising from the proximal anterior division. The microcatheter and wire were then used to select the inferior rectal artery proximally. Angiogram was performed which demonstrated no evidence of active extravasation. A Waltman loop was formed and the C2 catheter was used to select the right internal iliac artery. Angiogram of the right internal iliac artery demonstrated patent anterior posterior division within inferior rectal artery arising from the proximal aspect of the anterior division. The microcatheter and wire were used to select the inferior rectal artery. Angiogram demonstrated a possible focus of extravasation arising from a more superior branch of the  distal inferior rectal artery. The distal branch was selected with the microcatheter. Additional angiogram was performed which demonstrated no evidence of active extravasation spilling into the rectum, however there was hyperemia and irregularity of the vessels in this region. Therefore, Gel-Foam embolization was performed using approximately 1 mL of Gel-Foam slurry. The catheter was retracted slightly and additional angiogram was performed which demonstrated complete embolization of the distal inferior rectal artery and its branches. No persistent hyperemia was observed. The microcatheter was removed. The C2 catheter was advanced to un formed a Waltman loop and removed. A 6 French Angio-Seal was then placed and  deployed without complication or change in pulses. The patient tolerated the procedure well and remained stable throughout the procedure. The patient was transferred to the floor in stable condition. IMPRESSION: 1. Possible focal extravasation versus hyperemia about a distal branch of the right inferior rectal artery. 2. Successful Gel-Foam slurry embolization of the distal right inferior rectal artery. [name] Cancer, MD [facility] Radiology Electronically Signed   By: [name] M.D.   On: 11/02/2021 16:47   IR Angiogram Selective Each Additional Vessel  Result Date: 11/02/2021 INDICATION: 65 year old male status post recent transrectal prostate biopsy presenting with hematochezia, anemia, and CT arteriogram evidence of acute hemorrhage from the rectum. EXAM: 1. Ultrasound-guided vascular access of the right common femoral artery. 2. Abdominal aortogram. 3. Selective catheterization and angiography of the inferior mesenteric artery, superior rectal artery, bilateral internal iliac arteries, and bilateral inferior rectal arteries 4. Gel-Foam embolization of the distal right inferior rectal artery MEDICATIONS: None. ANESTHESIA/SEDATION: Moderate (conscious) sedation was employed during this procedure. A total of Versed 0 mg and Fentanyl 0 mcg was administered intravenously. Moderate Sedation Time: 0 minutes. The patient's level of consciousness and vital signs were monitored continuously by radiology nursing throughout the procedure under my direct supervision. CONTRAST:  31m OMNIPAQUE IOHEXOL 300 MG/ML SOLN, 56mOMNIPAQUE IOHEXOL 300 MG/ML SOLN, 256mMNIPAQUE IOHEXOL 300 MG/ML SOLN FLUOROSCOPY TIME:  Fluoroscopy Time: 31 minutes 12 seconds (2,778 mGy). COMPLICATIONS: None immediate. PROCEDURE: Informed consent was obtained from the patient following explanation of the procedure, risks, benefits and alternatives. The patient understands, agrees and consents  for the procedure. All questions were addressed. A time out was performed prior to the initiation of the procedure. Maximal barrier sterile technique utilized including caps, mask, sterile gowns, sterile gloves, large sterile drape, hand hygiene, and Betadine prep. The right groin was prepped and draped in standard fashion. Preprocedure ultrasound evaluation demonstrated patency of the right common femoral artery. A permanent image was captured and stored in the record. Subdermal Local anesthesia was provided at the planned needle entry site with 1% lidocaine. A small skin nick was made. Under direct ultrasound visualization, the right common femoral artery was accessed with standard micropuncture technique. A limited right lower extremity angiogram was performed which demonstrated adequate puncture site for closure device use. A J wire was directed to the abdominal aorta and exchanged for a 5 French, 11 cm vascular sheath. A RIM catheter was introduced and was attempted to be used to engage the IMA however was unsuccessful. Abdominal aortogram was then performed in a steep obliquity with a pigtail flush catheter to demonstrate the ostium of the IMA. The pigtail catheter was then exchanged for a Mickelson catheter which successfully engaged the IMA ostium. A Cook cantata microcatheter and a synchro soft microwire were then inserted into the proximal IMA. IMA angiogram was then performed which demonstrated patency of the superior rectal  artery and its branches. There is mild hyperemia in the rectum without definite evidence of active extravasation. The microcatheter was removed and the Mickelson catheter was redirected to select the left common iliac artery. The catheter was exchanged over a Glidewire for a C2 catheter. The C2 catheter was uses likely internal iliac artery. Internal iliac angiogram was performed which demonstrated patent anterior and posterior proximal branches and in inferior rectal artery arising  from the proximal anterior division. The microcatheter and wire were then used to select the inferior rectal artery proximally. Angiogram was performed which demonstrated no evidence of active extravasation. A Waltman loop was formed and the C2 catheter was used to select the right internal iliac artery. Angiogram of the right internal iliac artery demonstrated patent anterior posterior division within inferior rectal artery arising from the proximal aspect of the anterior division. The microcatheter and wire were used to select the inferior rectal artery. Angiogram demonstrated a possible focus of extravasation arising from a more superior branch of the distal inferior rectal artery. The distal branch was selected with the microcatheter. Additional angiogram was performed which demonstrated no evidence of active extravasation spilling into the rectum, however there was hyperemia and irregularity of the vessels in this region. Therefore, Gel-Foam embolization was performed using approximately 1 mL of Gel-Foam slurry. The catheter was retracted slightly and additional angiogram was performed which demonstrated complete embolization of the distal inferior rectal artery and its branches. No persistent hyperemia was observed. The microcatheter was removed. The C2 catheter was advanced to un formed a Waltman loop and removed. A 6 French Angio-Seal was then placed and deployed without complication or change in pulses. The patient tolerated the procedure well and remained stable throughout the procedure. The patient was transferred to the floor in stable condition. IMPRESSION: 1. Possible focal extravasation versus hyperemia about a distal branch of the right inferior rectal artery. 2. Successful Gel-Foam slurry embolization of the distal right inferior rectal artery. [name] Cancer, MD [facility] Radiology Electronically Signed   By: [name] M.D.   On: 11/02/2021 16:47    IR US Guide Vasc Access Right  Result Date: 11/02/2021 INDICATION: 65 year old male status post recent transrectal prostate biopsy presenting with hematochezia, anemia, and CT arteriogram evidence of acute hemorrhage from the rectum. EXAM: 1. Ultrasound-guided vascular access of the right common femoral artery. 2. Abdominal aortogram. 3. Selective catheterization and angiography of the inferior mesenteric artery, superior rectal artery, bilateral internal iliac arteries, and bilateral inferior rectal arteries 4. Gel-Foam embolization of the distal right inferior rectal artery MEDICATIONS: None. ANESTHESIA/SEDATION: Moderate (conscious) sedation was employed during this procedure. A total of Versed 0 mg and Fentanyl 0 mcg was administered intravenously. Moderate Sedation Time: 0 minutes. The patient's level of consciousness and vital signs were monitored continuously by radiology nursing throughout the procedure under my direct supervision. CONTRAST:  1m OMNIPAQUE IOHEXOL 300 MG/ML SOLN, 542mOMNIPAQUE IOHEXOL 300 MG/ML SOLN, 2547mMNIPAQUE IOHEXOL 300 MG/ML SOLN FLUOROSCOPY TIME:  Fluoroscopy Time: 31 minutes 12 seconds (2,778 mGy). COMPLICATIONS: None immediate. PROCEDURE: Informed consent was obtained from the patient following explanation of the procedure, risks, benefits and alternatives. The patient understands, agrees and consents for the procedure. All questions were addressed. A time out was performed prior to the initiation of the procedure. Maximal barrier sterile technique utilized including caps, mask, sterile gowns, sterile gloves, large sterile drape, hand hygiene, and Betadine prep. The right groin was prepped and draped in standard fashion. Preprocedure ultrasound evaluation  demonstrated patency of the right common femoral artery. A permanent image was captured and stored in the record. Subdermal Local anesthesia was provided at the planned needle entry site with 1% lidocaine. A small skin  nick was made. Under direct ultrasound visualization, the right common femoral artery was accessed with standard micropuncture technique. A limited right lower extremity angiogram was performed which demonstrated adequate puncture site for closure device use. A J wire was directed to the abdominal aorta and exchanged for a 5 French, 11 cm vascular sheath. A RIM catheter was introduced and was attempted to be used to engage the IMA however was unsuccessful. Abdominal aortogram was then performed in a steep obliquity with a pigtail flush catheter to demonstrate the ostium of the IMA. The pigtail catheter was then exchanged for a Mickelson catheter which successfully engaged the IMA ostium. A Cook cantata microcatheter and a synchro soft microwire were then inserted into the proximal IMA. IMA angiogram was then performed which demonstrated patency of the superior rectal artery and its branches. There is mild hyperemia in the rectum without definite evidence of active extravasation. The microcatheter was removed and the Mickelson catheter was redirected to select the left common iliac artery. The catheter was exchanged over a Glidewire for a C2 catheter. The C2 catheter was uses likely internal iliac artery. Internal iliac angiogram was performed which demonstrated patent anterior and posterior proximal branches and in inferior rectal artery arising from the proximal anterior division. The microcatheter and wire were then used to select the inferior rectal artery proximally. Angiogram was performed which demonstrated no evidence of active extravasation. A Waltman loop was formed and the C2 catheter was used to select the right internal iliac artery. Angiogram of the right internal iliac artery demonstrated patent anterior posterior division within inferior rectal artery arising from the proximal aspect of the anterior division. The microcatheter and wire were used to select the inferior rectal artery. Angiogram  demonstrated a possible focus of extravasation arising from a more superior branch of the distal inferior rectal artery. The distal branch was selected with the microcatheter. Additional angiogram was performed which demonstrated no evidence of active extravasation spilling into the rectum, however there was hyperemia and irregularity of the vessels in this region. Therefore, Gel-Foam embolization was performed using approximately 1 mL of Gel-Foam slurry. The catheter was retracted slightly and additional angiogram was performed which demonstrated complete embolization of the distal inferior rectal artery and its branches. No persistent hyperemia was observed. The microcatheter was removed. The C2 catheter was advanced to un formed a Waltman loop and removed. A 6 French Angio-Seal was then placed and deployed without complication or change in pulses. The patient tolerated the procedure well and remained stable throughout the procedure. The patient was transferred to the floor in stable condition. IMPRESSION: 1. Possible focal extravasation versus hyperemia about a distal branch of the right inferior rectal artery. 2. Successful Gel-Foam slurry embolization of the distal right inferior rectal artery. [name] Cancer, MD [facility] Radiology Electronically Signed   By: [name] M.D.   On: 11/02/2021 16:47   IR EMBO ART  VEN HEMORR LYMPH EXTRAV  INC GUIDE ROADMAPPING  Result Date: 11/02/2021 INDICATION: 65 year old male status post recent transrectal prostate biopsy presenting with hematochezia, anemia, and CT arteriogram evidence of acute hemorrhage from the rectum. EXAM: 1. Ultrasound-guided vascular access of the right common femoral artery. 2. Abdominal aortogram. 3. Selective catheterization and angiography of the inferior mesenteric artery, superior rectal artery,  bilateral internal iliac arteries, and bilateral inferior rectal arteries 4. Gel-Foam  embolization of the distal right inferior rectal artery MEDICATIONS: None. ANESTHESIA/SEDATION: Moderate (conscious) sedation was employed during this procedure. A total of Versed 0 mg and Fentanyl 0 mcg was administered intravenously. Moderate Sedation Time: 0 minutes. The patient's level of consciousness and vital signs were monitored continuously by radiology nursing throughout the procedure under my direct supervision. CONTRAST:  79m OMNIPAQUE IOHEXOL 300 MG/ML SOLN, 588mOMNIPAQUE IOHEXOL 300 MG/ML SOLN, 2588mMNIPAQUE IOHEXOL 300 MG/ML SOLN FLUOROSCOPY TIME:  Fluoroscopy Time: 31 minutes 12 seconds (2,778 mGy). COMPLICATIONS: None immediate. PROCEDURE: Informed consent was obtained from the patient following explanation of the procedure, risks, benefits and alternatives. The patient understands, agrees and consents for the procedure. All questions were addressed. A time out was performed prior to the initiation of the procedure. Maximal barrier sterile technique utilized including caps, mask, sterile gowns, sterile gloves, large sterile drape, hand hygiene, and Betadine prep. The right groin was prepped and draped in standard fashion. Preprocedure ultrasound evaluation demonstrated patency of the right common femoral artery. A permanent image was captured and stored in the record. Subdermal Local anesthesia was provided at the planned needle entry site with 1% lidocaine. A small skin nick was made. Under direct ultrasound visualization, the right common femoral artery was accessed with standard micropuncture technique. A limited right lower extremity angiogram was performed which demonstrated adequate puncture site for closure device use. A J wire was directed to the abdominal aorta and exchanged for a 5 French, 11 cm vascular sheath. A RIM catheter was introduced and was attempted to be used to engage the IMA however was unsuccessful. Abdominal aortogram was then performed in a steep obliquity with a pigtail  flush catheter to demonstrate the ostium of the IMA. The pigtail catheter was then exchanged for a Mickelson catheter which successfully engaged the IMA ostium. A Cook cantata microcatheter and a synchro soft microwire were then inserted into the proximal IMA. IMA angiogram was then performed which demonstrated patency of the superior rectal artery and its branches. There is mild hyperemia in the rectum without definite evidence of active extravasation. The microcatheter was removed and the Mickelson catheter was redirected to select the left common iliac artery. The catheter was exchanged over a Glidewire for a C2 catheter. The C2 catheter was uses likely internal iliac artery. Internal iliac angiogram was performed which demonstrated patent anterior and posterior proximal branches and in inferior rectal artery arising from the proximal anterior division. The microcatheter and wire were then used to select the inferior rectal artery proximally. Angiogram was performed which demonstrated no evidence of active extravasation. A Waltman loop was formed and the C2 catheter was used to select the right internal iliac artery. Angiogram of the right internal iliac artery demonstrated patent anterior posterior division within inferior rectal artery arising from the proximal aspect of the anterior division. The microcatheter and wire were used to select the inferior rectal artery. Angiogram demonstrated a possible focus of extravasation arising from a more superior branch of the distal inferior rectal artery. The distal branch was selected with the microcatheter. Additional angiogram was performed which demonstrated no evidence of active extravasation spilling into the rectum, however there was hyperemia and irregularity of the vessels in this region. Therefore, Gel-Foam embolization was performed using approximately 1 mL of Gel-Foam slurry. The catheter was retracted slightly and additional angiogram was performed which  demonstrated complete embolization of the distal inferior rectal artery and its branches. No  persistent hyperemia was observed. The microcatheter was removed. The C2 catheter was advanced to un formed a Waltman loop and removed. A 6 French Angio-Seal was then placed and deployed without complication or change in pulses. The patient tolerated the procedure well and remained stable throughout the procedure. The patient was transferred to the floor in stable condition. IMPRESSION: 1. Possible focal extravasation versus hyperemia about a distal branch of the right inferior rectal artery. 2. Successful Gel-Foam slurry embolization of the distal right inferior rectal artery. [name] Cancer, MD [facility] Electronically Signed   By: [name] M.D.   On: 11/02/2021 16:47   CT Angio Abd/Pel w/ and/or w/o  Result Date: 11/02/2021 CLINICAL DATA:  Acute lower GI bleeding, status post prostate biopsy last week. EXAM: CT ANGIOGRAPHY ABDOMEN AND PELVIS WITH CONTRAST AND WITHOUT CONTRAST TECHNIQUE: Multidetector CT imaging of the abdomen and pelvis was performed using the standard protocol during bolus administration of intravenous contrast. Multiplanar reconstructed images and MIPs were obtained and reviewed to evaluate the vascular anatomy. CONTRAST:  168m OMNIPAQUE IOHEXOL 350 MG/ML SOLN COMPARISON:  10/08/2020 FINDINGS: VASCULAR Aorta: Minor aortic atherosclerosis. Negative for aneurysm, dissection or occlusive process. No retroperitoneal hemorrhage or hematoma. Celiac: Minor atherosclerotic origin but remains patent including its branches SMA: Minor atherosclerotic origin but remains patent including its branches Renals: Main renal arteries remain patent. Minor atherosclerotic changes. No accessory renal artery. IMA: Remains patent off the distal aorta anteriorly including its branches. Lower rectum active contrast extravasation on the arterial phase which  progresses on the delayed portal venous phase imaging compatible with acute active lower rectal GI bleeding. Inflow: Iliac atherosclerosis noted but no inflow disease or occlusion. Mild iliac tortuosity. Common, internal and external iliac arteries all remain patent. Proximal Outflow: Common femoral, proximal profunda femoral, proximal superficial femoral arteries all remain patent. Veins: No veno-occlusive process. Review of the MIP images confirms the above findings. NON-VASCULAR Lower chest: No acute abnormality. Hepatobiliary: No focal hepatic abnormality. Small left hepatic lobe noted. No biliary obstruction pattern dilatation. Patent portal and hepatic veins. Small gallstones noted. Common bile duct nondilated. Pancreas: Unremarkable. No pancreatic ductal dilatation or surrounding inflammatory changes. Spleen: Normal in size without focal abnormality. Adrenals/Urinary Tract: Adrenal glands are unremarkable. Kidneys are normal, without renal calculi, focal lesion, or hydronephrosis. Bladder is unremarkable. Stomach/Bowel: Postop changes from gastric bypass. Negative for bowel obstruction, significant dilatation, ileus, or free air. Appendix not visualized. No free fluid, fluid collection, abscess or ascites. Lymphatic: No bulky adenopathy. Reproductive: No other significant finding by CT. Other: No abdominal wall hernia or abnormality. No abdominopelvic ascites. Musculoskeletal: Degenerative changes throughout the spine. Multilevel lower lumbar facet arthropathy. No acute osseous finding. IMPRESSION: VASCULAR Positive CTA for acute lower rectal GI bleeding. These results were called by telephone at the time of interpretation on 11/02/2021 at 11:16 am to provider AMY ESTERWOOD , who verbally acknowledged these results. NON-VASCULAR Remote gastric bypass surgery Incidental cholelithiasis Electronically Signed   By: MJerilynn Mages  Shick M.D.   On: 11/02/2021 11:21     ASSESSMENT AND PLAN:  This is a 65year old male with  a history of bilateral PE in 2007 who has been on Coumadin since that time without recurrent DVT or PE, significant anemia secondary to rectal bleeding following prostate biopsy (does not accept blood products secondary to religious reasons), and new diagnosis of prostate [name] (biopsy results not available to me in epic-see note from urology dated 11/02/2021).  1.  History of bilateral PE in  2007 2.  Anemia secondary to rectal bleeding 3.  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  [employer]  -The patient has a history of what sounds like a provoked PE almost 15 years ago.  He has been on Coumadin since then.  Given that this was a provoked event and he has no recurrent PE or DVT, would recommend discontinuation of Coumadin. -The patient developed rectal bleeding following prostate biopsy.  This was likely exacerbated by him being on anticoagulation.  Had a colonoscopy about a year ago which was normal.  He is status post embolization by IR.  Bleeding seems to stop at this time.  Hemoglobin has dropped significantly since admission.  He declines blood transfusions due to religious reasons.  Recommend supportive care with IV iron, vitamin I45, and folic acid. -We discussed proceeding with EPO but given his recent diagnosis of prostate [name], this is a relative contraindication.  Risks of EPO will be discussed further with him by Dr. Burr Medico when seen later today. -The patient will follow-up with urology as an outpatient for management of his prostate [name].  Thank you for this referral.  Mikey Bussing, DNP, AGPCNP-BC, AOCNP   Addendum  I have seen the patient, examined him. I agree with the assessment and and plan and have edited the notes.   65 yo male with PMH of provoked PE 15 years ago, on Coumadin, recently diagnosed prostate [name], s/p transrectal prostate biopsy on 10/26/21, which revealed [id number] Gleason score 7 prostate [name]. He was admitted with significant  rectal bleeding and Hg dropped to 6.8 this morning s/p IR arterial embolization last night.  Patient is a Jehovah witness, and firmly declined blood transfusion.  His iron level was normal yesterday, I added ferritin which came back low at 19.  He has chronic microcytosis and mild intermittent anemia, likely related to small intermittent GI bleeding from Coumadin.  I recommend IV Feraheme 517m once today and second dose in my office next week.  I discussed the role of ESA, and potential side effect, including increased risk of tumor progression and shorten survival in patients with active malignancy.  Due to his untreated prostate [name], I will reserve ESA as a last resort if he does not respond to iv iron, or if his HB continue to drop to below 6.0 especially below 5. Pt agrees with iv iron. Will also start him on folic acid and BY09to maximize his red blood cell production.  We will f/u his lab while he is in hospital, and will schedule his f/u with me in clinic upon discharge.   YTruitt Merle 11/03/2021

## 2021-11-04 ENCOUNTER — Encounter (HOSPITAL_COMMUNITY): Payer: Self-pay | Admitting: Internal Medicine

## 2021-11-04 ENCOUNTER — Inpatient Hospital Stay (HOSPITAL_COMMUNITY): Payer: HMO

## 2021-11-04 DIAGNOSIS — R935 Abnormal findings on diagnostic imaging of other abdominal regions, including retroperitoneum: Secondary | ICD-10-CM

## 2021-11-04 DIAGNOSIS — IMO0001 Reserved for inherently not codable concepts without codable children: Secondary | ICD-10-CM

## 2021-11-04 DIAGNOSIS — E538 Deficiency of other specified B group vitamins: Secondary | ICD-10-CM

## 2021-11-04 DIAGNOSIS — D62 Acute posthemorrhagic anemia: Secondary | ICD-10-CM | POA: Diagnosis not present

## 2021-11-04 DIAGNOSIS — Z531 Procedure and treatment not carried out because of patient's decision for reasons of belief and group pressure: Secondary | ICD-10-CM

## 2021-11-04 DIAGNOSIS — E1169 Type 2 diabetes mellitus with other specified complication: Secondary | ICD-10-CM | POA: Diagnosis not present

## 2021-11-04 DIAGNOSIS — K922 Gastrointestinal hemorrhage, unspecified: Secondary | ICD-10-CM | POA: Diagnosis not present

## 2021-11-04 DIAGNOSIS — K921 Melena: Secondary | ICD-10-CM | POA: Diagnosis not present

## 2021-11-04 HISTORY — PX: IR ANGIOGRAM PELVIS SELECTIVE OR SUPRASELECTIVE: IMG661

## 2021-11-04 HISTORY — PX: IR US GUIDE VASC ACCESS RIGHT: IMG2390

## 2021-11-04 HISTORY — PX: IR ANGIOGRAM SELECTIVE EACH ADDITIONAL VESSEL: IMG667

## 2021-11-04 HISTORY — PX: IR ANGIOGRAM VISCERAL SELECTIVE: IMG657

## 2021-11-04 HISTORY — PX: IR EMBO ART  VEN HEMORR LYMPH EXTRAV  INC GUIDE ROADMAPPING: IMG5450

## 2021-11-04 LAB — BASIC METABOLIC PANEL
Anion gap: 1 — ABNORMAL LOW (ref 5–15)
BUN: 25 mg/dL — ABNORMAL HIGH (ref 8–23)
CO2: 24 mmol/L (ref 22–32)
Calcium: 7.6 mg/dL — ABNORMAL LOW (ref 8.9–10.3)
Chloride: 110 mmol/L (ref 98–111)
Creatinine, Ser: 1.26 mg/dL — ABNORMAL HIGH (ref 0.61–1.24)
GFR, Estimated: >60 mL/min (ref >60)
Glucose, Bld: 117 mg/dL — ABNORMAL HIGH (ref 70–99)
Potassium: 4 mmol/L (ref 3.5–5.1)
Sodium: 135 mmol/L (ref 135–145)

## 2021-11-04 LAB — HEMOGLOBIN AND HEMATOCRIT, BLOOD
HCT: 15.5 % — ABNORMAL LOW (ref 39.0–52.0)
HCT: 17.1 % — ABNORMAL LOW (ref 39.0–52.0)
HCT: 15.5 % — ABNORMAL LOW (ref 39.0–52.0)
Hemoglobin: 4.9 g/dL — CL (ref 13.0–17.0)
Hemoglobin: 5.4 g/dL — CL (ref 13.0–17.0)
Hemoglobin: 4.9 g/dL — CL (ref 13.0–17.0)

## 2021-11-04 LAB — GLUCOSE, CAPILLARY
Glucose-Capillary: 117 mg/dL — ABNORMAL HIGH (ref 70–99)
Glucose-Capillary: 127 mg/dL — ABNORMAL HIGH (ref 70–99)
Glucose-Capillary: 150 mg/dL — ABNORMAL HIGH (ref 70–99)
Glucose-Capillary: 132 mg/dL — ABNORMAL HIGH (ref 70–99)

## 2021-11-04 MED ORDER — IOHEXOL 300 MG/ML  SOLN
100.0000 mL | Freq: Once | INTRAMUSCULAR | Status: AC | PRN
  Administered 2021-11-04: 30 mL via INTRA_ARTERIAL

## 2021-11-04 MED ORDER — IOHEXOL 300 MG/ML  SOLN
100.0000 mL | Freq: Once | INTRAMUSCULAR | Status: AC | PRN
  Administered 2021-11-04: 40 mL via INTRA_ARTERIAL

## 2021-11-04 MED ORDER — LIDOCAINE HCL (PF) 1 % IJ SOLN
INTRAMUSCULAR | Status: AC | PRN
  Administered 2021-11-04: 10 mL via INTRADERMAL

## 2021-11-04 MED ORDER — GELATIN ABSORBABLE 12-7 MM EX MISC
CUTANEOUS | Status: AC | PRN
  Administered 2021-11-04: 1

## 2021-11-04 MED ORDER — FENTANYL CITRATE (PF) 100 MCG/2ML IJ SOLN
INTRAMUSCULAR | Status: AC
  Filled 2021-11-04: qty 2

## 2021-11-04 MED ORDER — MIDAZOLAM HCL 2 MG/2ML IJ SOLN
INTRAMUSCULAR | Status: AC | PRN
  Administered 2021-11-04: .5 mg via INTRAVENOUS

## 2021-11-04 MED ORDER — FENTANYL CITRATE (PF) 100 MCG/2ML IJ SOLN
INTRAMUSCULAR | Status: AC | PRN
  Administered 2021-11-04: 25 ug via INTRAVENOUS

## 2021-11-04 MED ORDER — LIDOCAINE HCL 1 % IJ SOLN
INTRAMUSCULAR | Status: AC
  Filled 2021-11-04: qty 20

## 2021-11-04 MED ORDER — GELATIN ABSORBABLE 12-7 MM EX MISC
CUTANEOUS | Status: AC
  Filled 2021-11-04: qty 1

## 2021-11-04 MED ORDER — MIDAZOLAM HCL 2 MG/2ML IJ SOLN
INTRAMUSCULAR | Status: AC
  Filled 2021-11-04: qty 2

## 2021-11-04 MED ORDER — MIDAZOLAM HCL 2 MG/2ML IJ SOLN
INTRAMUSCULAR | Status: AC | PRN
  Administered 2021-11-04: 1 mg via INTRAVENOUS

## 2021-11-04 MED ORDER — OXYCODONE-ACETAMINOPHEN 5-325 MG PO TABS
1.0000 | ORAL_TABLET | ORAL | Status: DC | PRN
Start: 2021-11-04 — End: 2021-11-08
  Administered 2021-11-04 – 2021-11-06 (×5): 2 via ORAL
  Filled 2021-11-04 (×2): qty 1
  Filled 2021-11-04: qty 2
  Filled 2021-11-04: qty 1
  Filled 2021-11-04 (×2): qty 2
  Filled 2021-11-04: qty 1

## 2021-11-04 MED ORDER — DARBEPOETIN ALFA 200 MCG/0.4ML IJ SOSY
200.0000 ug | PREFILLED_SYRINGE | Freq: Once | INTRAMUSCULAR | Status: AC
  Administered 2021-11-04: 200 ug via SUBCUTANEOUS
  Filled 2021-11-04 (×2): qty 0.4

## 2021-11-04 NOTE — Progress Notes (Signed)
PROGRESS NOTE    [name]  DVV:[id number] DOB: 1956/09/18 DOA: 11/02/2021 PCP: Biagio Borg, MD   Chief Complaint  Patient presents with   Rectal Bleeding    Brief Narrative:  65 year old gentleman(Jehovah's Witness), prior history of hypertension, hyperlipidemia, pulmonary embolism on Coumadin and Lovenox, type 2 diabetes, prostate cancer, GERD, recently underwent transrectal ultrasound-guided prostate biopsy on 10/26/2021 secondary to elevated PSA presents to ED with significant bright red blood per rectum, some lightheadedness and dizziness.  Patient denies any NSAID use.   Patient underwent reversal with vitamin K in the ER embolization on 11/02/2021.  Patient continues to have bright red blood per rectum.  GI was consulted.  IR consulted and patient underwent mesenteric and pelvic angiogram with particle embolization of superior rectal artery with Gelfoam embolization of anterior division of the right internal iliac artery.  Urology, oncology, gastroenterology, general surgery, IR on board and appreciate recommendations. Patient seen with family at bedside, denies any chest pain, shortness of breath, nausea vomiting or abdominal pain at this time    Assessment & Plan:   Principal Problem:   GIB (gastrointestinal bleeding) Active Problems:   Hyperlipidemia   Depression   Essential hypertension   Pulmonary embolism (HCC)   Gross hematuria   Acute blood loss anemia   Esophageal reflux   History of pulmonary embolus (PE)   Diabetes mellitus type 2 in obese (HCC)   Chest pain   Hematochezia  Acute GI bleed/acute blood loss anemia Probably secondary to lower rectal bleed as noted on the CT angiogram of the abdomen and pelvis. Hemoglobin around 11.5 on admission down to 4.9 this morning.  Patient is s/p mesenteric and pelvic angiogram with particle embolization of superior rectal artery and Gelfoam embolization of the anterior addition of the right internal iliac  artery. Patient is Jehovah's Witness and will not receive any blood products at this time. Hematology on board and recommending supportive care with IV iron, folic acid and G26 supplements. Hematology also recommending discontinuation of anticoagulation with Coumadin as patient's provoked PE/DVT was almost 15 years ago, without any recurrent clots. Continue to monitor and transfer the patient to progressive care.   History of bilateral PE Patient noted to have provoked PE/DVT in 2007 and was started on anticoagulation. In light of the ongoing lower GI bleed, hematology recommending discontinuation of anticoagulation with Coumadin. Patient is agreeable to the plan.    New diagnosis of intermediate risk prostate cancer Urology on board and recommend outpatient follow-up.    Chest pain Appears to have resolved. 2D echocardiogram showed left ventricular ejection fraction of 70% with no regional wall motion abnormalities, grade 1 diastolic dysfunction present.     Type 2 diabetes mellitus Hemoglobin A1c around 7.2 in October 2022 Continue with sliding scale insulin.    Essential hypertension Blood pressure parameters are appear to be optimal    Hyperlipidemia Continue with statin   Depression Patient was on Lexapro which is being held for now.     GERD Continue with PPI     DVT prophylaxis: (SCDs Code Status: Full code Family Communication: Family at bedside Disposition:   Status is: Inpatient  Remains inpatient appropriate because: Severe anemia work-up       Consultants:  Gastroenterology General surgery IR Dr. Serafina Royals  Procedures: IR EMBOLIZATION.   Antimicrobials: none.    Subjective: No new complaints.   Objective: Vitals:   11/04/21 0920 11/04/21 0925 11/04/21 0930 11/04/21 1027  BP: 113/71 124/77  120/64  Pulse: 70  72 77 69  Resp: 18 19 (!) 21 16  Temp:    98.4 F (36.9 C)  TempSrc:      SpO2: 99% 99% 95% 97%  Weight:       Height:        Intake/Output Summary (Last 24 hours) at 11/04/2021 1253 Last data filed at 11/04/2021 1159 Gross per 24 hour  Intake 2394.47 ml  Output 2325 ml  Net 69.47 ml   Filed Weights   11/02/21 0247 11/03/21 1917 11/04/21 0453  Weight: 113.4 kg 113.4 kg 122 kg    Examination:  General exam: Appears calm and comfortable  Respiratory system: Clear to auscultation. Respiratory effort normal. Cardiovascular system: S1 & S2 heard, RRR. No JVD, murmurs, No pedal edema. Gastrointestinal system: Abdomen is nondistended, soft and nontender.. Normal bowel sounds heard. Central nervous system: Alert and oriented. No focal neurological deficits. Extremities: Symmetric 5 x 5 power. Skin: No rashes, lesions or ulcers Psychiatry: Judgement and insight appear normal. Mood & affect appropriate.     Data Reviewed: I have personally reviewed following labs and imaging studies  CBC: Recent Labs  Lab 11/02/21 0340 11/02/21 1224 11/03/21 0455 11/03/21 1142 11/03/21 1737 11/03/21 2137 11/04/21 0501  WBC 6.0  --  7.8  --   --   --   --   NEUTROABS 2.9  --   --   --   --   --   --   HGB 11.5*   < > 6.8* 6.0* 6.6* 5.2* 4.9*  HCT 37.7*   < > 22.2* 19.5* 21.3* 16.9* 15.5*  MCV 72.2*  --  73.3*  --   --   --   --   PLT 191  --  146*  --   --   --   --    < > = values in this interval not displayed.    Basic Metabolic Panel: Recent Labs  Lab 11/02/21 0340 11/03/21 0455 11/04/21 0501  NA 139 134* 135  K 4.9 4.5 4.0  CL 105 107 110  CO2 25 21* 24  GLUCOSE 101* 168* 117*  BUN 31* 36* 25*  CREATININE 1.29* 1.67* 1.26*  CALCIUM 8.9 8.1* 7.6*    GFR: Estimated Creatinine Clearance: 78.9 mL/min (A) (by C-G formula based on SCr of 1.26 mg/dL (H)).  Liver Function Tests: Recent Labs  Lab 11/02/21 0340 11/03/21 0455  AST 43* 28  ALT 34 27  ALKPHOS 48 37*  BILITOT 0.5 0.5  PROT 6.8 4.9*  ALBUMIN 4.0 3.1*    CBG: Recent Labs  Lab 11/03/21 1811 11/03/21 2052  11/03/21 2337 11/04/21 0455 11/04/21 1205  GLUCAP 189* 160* 142* 117* 127*     Recent Results (from the past 240 hour(s))  Resp Panel by RT-PCR (Flu A&B, Covid) Nasopharyngeal Swab     Status: None   Collection Time: 11/02/21  7:06 AM   Specimen: Nasopharyngeal Swab; Nasopharyngeal(NP) swabs in vial transport medium  Result Value Ref Range Status   SARS Coronavirus 2 by RT PCR NEGATIVE NEGATIVE Final    Comment: (NOTE) SARS-CoV-2 target nucleic acids are NOT DETECTED.  The SARS-CoV-2 RNA is generally detectable in upper respiratory specimens during the acute phase of infection. The lowest concentration of SARS-CoV-2 viral copies this assay can detect is 138 copies/mL. A negative result does not preclude SARS-Cov-2 infection and should not be used as the sole basis for treatment or other patient management decisions. A negative result may occur with  improper specimen collection/handling,  submission of specimen other than nasopharyngeal swab, presence of viral mutation(s) within the areas targeted by this assay, and inadequate number of viral copies(<138 copies/mL). A negative result must be combined with clinical observations, patient history, and epidemiological information. The expected result is Negative.  Fact Sheet for Patients:  EntrepreneurPulse.com.au  Fact Sheet for Healthcare Providers:  IncredibleEmployment.be  This test is no t yet approved or cleared by the [place] FDA and  has been authorized for detection and/or diagnosis of SARS-CoV-2 by FDA under an Emergency Use Authorization (EUA). This EUA will remain  in effect (meaning this test can be used) for the duration of the COVID-19 declaration under Section 564(b)(1) of the Act, 21 U.S.C.section 360bbb-3(b)(1), unless the authorization is terminated  or revoked sooner.       Influenza A by PCR NEGATIVE NEGATIVE Final   Influenza B by PCR NEGATIVE NEGATIVE Final     Comment: (NOTE) The Xpert Xpress SARS-CoV-2/FLU/RSV plus assay is intended as an aid in the diagnosis of influenza from Nasopharyngeal swab specimens and should not be used as a sole basis for treatment. Nasal washings and aspirates are unacceptable for Xpert Xpress SARS-CoV-2/FLU/RSV testing.  Fact Sheet for Patients: EntrepreneurPulse.com.au  Fact Sheet for Healthcare Providers: IncredibleEmployment.be  This test is not yet approved or cleared by the [place] FDA and has been authorized for detection and/or diagnosis of SARS-CoV-2 by FDA under an Emergency Use Authorization (EUA). This EUA will remain in effect (meaning this test can be used) for the duration of the COVID-19 declaration under Section 564(b)(1) of the Act, 21 U.S.C. section 360bbb-3(b)(1), unless the authorization is terminated or revoked.  Performed at [facility], [place] [address] [place], [place] [address]   MRSA Next Gen by PCR, Nasal     Status: None   Collection Time: 11/02/21 12:53 PM   Specimen: Nasal Mucosa; Nasal Swab  Result Value Ref Range Status   MRSA by PCR Next Gen NOT DETECTED NOT DETECTED Final    Comment: (NOTE) The GeneXpert MRSA Assay (FDA approved for NASAL specimens only), is one component of a comprehensive MRSA colonization surveillance program. It is not intended to diagnose MRSA infection nor to guide or monitor treatment for MRSA infections. Test performance is not FDA approved in patients less than 97 years old. Performed at [facility], [place] [address] [place],  [address]          Radiology Studies: IR Angiogram Visceral Selective  Result Date: 11/02/2021 INDICATION: 65 year old male status post recent transrectal prostate biopsy presenting with hematochezia, anemia, and CT arteriogram evidence of acute hemorrhage from the rectum. EXAM: 1. Ultrasound-guided vascular access of the right  common femoral artery. 2. Abdominal aortogram. 3. Selective catheterization and angiography of the inferior mesenteric artery, superior rectal artery, bilateral internal iliac arteries, and bilateral inferior rectal arteries 4. Gel-Foam embolization of the distal right inferior rectal artery MEDICATIONS: None. ANESTHESIA/SEDATION: Moderate (conscious) sedation was employed during this procedure. A total of Versed 0 mg and Fentanyl 0 mcg was administered intravenously. Moderate Sedation Time: 0 minutes. The patient's level of consciousness and vital signs were monitored continuously by radiology nursing throughout the procedure under my direct supervision. CONTRAST:  62mL OMNIPAQUE IOHEXOL 300 MG/ML SOLN, 35mL OMNIPAQUE IOHEXOL 300 MG/ML SOLN, 75mL OMNIPAQUE IOHEXOL 300 MG/ML SOLN FLUOROSCOPY TIME:  Fluoroscopy Time: 31 minutes 12 seconds (2,778 mGy). COMPLICATIONS: None immediate. PROCEDURE: Informed consent was obtained from the patient following explanation of the procedure, risks, benefits and alternatives.  The patient understands, agrees and consents for the procedure. All questions were addressed. A time out was performed prior to the initiation of the procedure. Maximal barrier sterile technique utilized including caps, mask, sterile gowns, sterile gloves, large sterile drape, hand hygiene, and Betadine prep. The right groin was prepped and draped in standard fashion. Preprocedure ultrasound evaluation demonstrated patency of the right common femoral artery. A permanent image was captured and stored in the record. Subdermal Local anesthesia was provided at the planned needle entry site with 1% lidocaine. A small skin nick was made. Under direct ultrasound visualization, the right common femoral artery was accessed with standard micropuncture technique. A limited right lower extremity angiogram was performed which demonstrated adequate puncture site for closure device use. A J wire was directed to the abdominal  aorta and exchanged for a 5 French, 11 cm vascular sheath. A RIM catheter was introduced and was attempted to be used to engage the IMA however was unsuccessful. Abdominal aortogram was then performed in a steep obliquity with a pigtail flush catheter to demonstrate the ostium of the IMA. The pigtail catheter was then exchanged for a Mickelson catheter which successfully engaged the IMA ostium. A Cook cantata microcatheter and a synchro soft microwire were then inserted into the proximal IMA. IMA angiogram was then performed which demonstrated patency of the superior rectal artery and its branches. There is mild hyperemia in the rectum without definite evidence of active extravasation. The microcatheter was removed and the Mickelson catheter was redirected to select the left common iliac artery. The catheter was exchanged over a Glidewire for a C2 catheter. The C2 catheter was uses likely internal iliac artery. Internal iliac angiogram was performed which demonstrated patent anterior and posterior proximal branches and in inferior rectal artery arising from the proximal anterior division. The microcatheter and wire were then used to select the inferior rectal artery proximally. Angiogram was performed which demonstrated no evidence of active extravasation. A Waltman loop was formed and the C2 catheter was used to select the right internal iliac artery. Angiogram of the right internal iliac artery demonstrated patent anterior posterior division within inferior rectal artery arising from the proximal aspect of the anterior division. The microcatheter and wire were used to select the inferior rectal artery. Angiogram demonstrated a possible focus of extravasation arising from a more superior branch of the distal inferior rectal artery. The distal branch was selected with the microcatheter. Additional angiogram was performed which demonstrated no evidence of active extravasation spilling into the rectum, however there  was hyperemia and irregularity of the vessels in this region. Therefore, Gel-Foam embolization was performed using approximately 1 mL of Gel-Foam slurry. The catheter was retracted slightly and additional angiogram was performed which demonstrated complete embolization of the distal inferior rectal artery and its branches. No persistent hyperemia was observed. The microcatheter was removed. The C2 catheter was advanced to un formed a Waltman loop and removed. A 6 French Angio-Seal was then placed and deployed without complication or change in pulses. The patient tolerated the procedure well and remained stable throughout the procedure. The patient was transferred to the floor in stable condition. IMPRESSION: 1. Possible focal extravasation versus hyperemia about a distal branch of the right inferior rectal artery. 2. Successful Gel-Foam slurry embolization of the distal right inferior rectal artery. Ruthann Cancer, MD [facility] Radiology Electronically Signed   By: Ruthann Cancer M.D.   On: 11/02/2021 16:47   IR Angiogram Pelvis Selective Or Supraselective  Result  Date: 11/02/2021 INDICATION: 65 year old male status post recent transrectal prostate biopsy presenting with hematochezia, anemia, and CT arteriogram evidence of acute hemorrhage from the rectum. EXAM: 1. Ultrasound-guided vascular access of the right common femoral artery. 2. Abdominal aortogram. 3. Selective catheterization and angiography of the inferior mesenteric artery, superior rectal artery, bilateral internal iliac arteries, and bilateral inferior rectal arteries 4. Gel-Foam embolization of the distal right inferior rectal artery MEDICATIONS: None. ANESTHESIA/SEDATION: Moderate (conscious) sedation was employed during this procedure. A total of Versed 0 mg and Fentanyl 0 mcg was administered intravenously. Moderate Sedation Time: 0 minutes. The patient's level of consciousness and vital signs  were monitored continuously by radiology nursing throughout the procedure under my direct supervision. CONTRAST:  17mL OMNIPAQUE IOHEXOL 300 MG/ML SOLN, 11mL OMNIPAQUE IOHEXOL 300 MG/ML SOLN, 26mL OMNIPAQUE IOHEXOL 300 MG/ML SOLN FLUOROSCOPY TIME:  Fluoroscopy Time: 31 minutes 12 seconds (2,778 mGy). COMPLICATIONS: None immediate. PROCEDURE: Informed consent was obtained from the patient following explanation of the procedure, risks, benefits and alternatives. The patient understands, agrees and consents for the procedure. All questions were addressed. A time out was performed prior to the initiation of the procedure. Maximal barrier sterile technique utilized including caps, mask, sterile gowns, sterile gloves, large sterile drape, hand hygiene, and Betadine prep. The right groin was prepped and draped in standard fashion. Preprocedure ultrasound evaluation demonstrated patency of the right common femoral artery. A permanent image was captured and stored in the record. Subdermal Local anesthesia was provided at the planned needle entry site with 1% lidocaine. A small skin nick was made. Under direct ultrasound visualization, the right common femoral artery was accessed with standard micropuncture technique. A limited right lower extremity angiogram was performed which demonstrated adequate puncture site for closure device use. A J wire was directed to the abdominal aorta and exchanged for a 5 French, 11 cm vascular sheath. A RIM catheter was introduced and was attempted to be used to engage the IMA however was unsuccessful. Abdominal aortogram was then performed in a steep obliquity with a pigtail flush catheter to demonstrate the ostium of the IMA. The pigtail catheter was then exchanged for a Mickelson catheter which successfully engaged the IMA ostium. A Cook cantata microcatheter and a synchro soft microwire were then inserted into the proximal IMA. IMA angiogram was then performed which demonstrated patency of  the superior rectal artery and its branches. There is mild hyperemia in the rectum without definite evidence of active extravasation. The microcatheter was removed and the Mickelson catheter was redirected to select the left common iliac artery. The catheter was exchanged over a Glidewire for a C2 catheter. The C2 catheter was uses likely internal iliac artery. Internal iliac angiogram was performed which demonstrated patent anterior and posterior proximal branches and in inferior rectal artery arising from the proximal anterior division. The microcatheter and wire were then used to select the inferior rectal artery proximally. Angiogram was performed which demonstrated no evidence of active extravasation. A Waltman loop was formed and the C2 catheter was used to select the right internal iliac artery. Angiogram of the right internal iliac artery demonstrated patent anterior posterior division within inferior rectal artery arising from the proximal aspect of the anterior division. The microcatheter and wire were used to select the inferior rectal artery. Angiogram demonstrated a possible focus of extravasation arising from a more superior branch of the distal inferior rectal artery. The distal branch was selected with the microcatheter. Additional angiogram was performed which demonstrated no evidence of active extravasation spilling  into the rectum, however there was hyperemia and irregularity of the vessels in this region. Therefore, Gel-Foam embolization was performed using approximately 1 mL of Gel-Foam slurry. The catheter was retracted slightly and additional angiogram was performed which demonstrated complete embolization of the distal inferior rectal artery and its branches. No persistent hyperemia was observed. The microcatheter was removed. The C2 catheter was advanced to un formed a Waltman loop and removed. A 6 French Angio-Seal was then placed and deployed without complication or change in pulses. The  patient tolerated the procedure well and remained stable throughout the procedure. The patient was transferred to the floor in stable condition. IMPRESSION: 1. Possible focal extravasation versus hyperemia about a distal branch of the right inferior rectal artery. 2. Successful Gel-Foam slurry embolization of the distal right inferior rectal artery. Ruthann Cancer, MD [facility] Radiology Electronically Signed   By: Ruthann Cancer M.D.   On: 11/02/2021 16:47   IR Angiogram Selective Each Additional Vessel  Result Date: 11/02/2021 INDICATION: 65 year old male status post recent transrectal prostate biopsy presenting with hematochezia, anemia, and CT arteriogram evidence of acute hemorrhage from the rectum. EXAM: 1. Ultrasound-guided vascular access of the right common femoral artery. 2. Abdominal aortogram. 3. Selective catheterization and angiography of the inferior mesenteric artery, superior rectal artery, bilateral internal iliac arteries, and bilateral inferior rectal arteries 4. Gel-Foam embolization of the distal right inferior rectal artery MEDICATIONS: None. ANESTHESIA/SEDATION: Moderate (conscious) sedation was employed during this procedure. A total of Versed 0 mg and Fentanyl 0 mcg was administered intravenously. Moderate Sedation Time: 0 minutes. The patient's level of consciousness and vital signs were monitored continuously by radiology nursing throughout the procedure under my direct supervision. CONTRAST:  54mL OMNIPAQUE IOHEXOL 300 MG/ML SOLN, 8mL OMNIPAQUE IOHEXOL 300 MG/ML SOLN, 27mL OMNIPAQUE IOHEXOL 300 MG/ML SOLN FLUOROSCOPY TIME:  Fluoroscopy Time: 31 minutes 12 seconds (2,778 mGy). COMPLICATIONS: None immediate. PROCEDURE: Informed consent was obtained from the patient following explanation of the procedure, risks, benefits and alternatives. The patient understands, agrees and consents for the procedure. All questions were addressed. A time  out was performed prior to the initiation of the procedure. Maximal barrier sterile technique utilized including caps, mask, sterile gowns, sterile gloves, large sterile drape, hand hygiene, and Betadine prep. The right groin was prepped and draped in standard fashion. Preprocedure ultrasound evaluation demonstrated patency of the right common femoral artery. A permanent image was captured and stored in the record. Subdermal Local anesthesia was provided at the planned needle entry site with 1% lidocaine. A small skin nick was made. Under direct ultrasound visualization, the right common femoral artery was accessed with standard micropuncture technique. A limited right lower extremity angiogram was performed which demonstrated adequate puncture site for closure device use. A J wire was directed to the abdominal aorta and exchanged for a 5 French, 11 cm vascular sheath. A RIM catheter was introduced and was attempted to be used to engage the IMA however was unsuccessful. Abdominal aortogram was then performed in a steep obliquity with a pigtail flush catheter to demonstrate the ostium of the IMA. The pigtail catheter was then exchanged for a Mickelson catheter which successfully engaged the IMA ostium. A Cook cantata microcatheter and a synchro soft microwire were then inserted into the proximal IMA. IMA angiogram was then performed which demonstrated patency of the superior rectal artery and its branches. There is mild hyperemia in the rectum without definite evidence of active extravasation. The microcatheter was removed and the [facility]  catheter was redirected to select the left common iliac artery. The catheter was exchanged over a Glidewire for a C2 catheter. The C2 catheter was uses likely internal iliac artery. Internal iliac angiogram was performed which demonstrated patent anterior and posterior proximal branches and in inferior rectal artery arising from the proximal anterior division. The microcatheter  and wire were then used to select the inferior rectal artery proximally. Angiogram was performed which demonstrated no evidence of active extravasation. A Waltman loop was formed and the C2 catheter was used to select the right internal iliac artery. Angiogram of the right internal iliac artery demonstrated patent anterior posterior division within inferior rectal artery arising from the proximal aspect of the anterior division. The microcatheter and wire were used to select the inferior rectal artery. Angiogram demonstrated a possible focus of extravasation arising from a more superior branch of the distal inferior rectal artery. The distal branch was selected with the microcatheter. Additional angiogram was performed which demonstrated no evidence of active extravasation spilling into the rectum, however there was hyperemia and irregularity of the vessels in this region. Therefore, Gel-Foam embolization was performed using approximately 1 mL of Gel-Foam slurry. The catheter was retracted slightly and additional angiogram was performed which demonstrated complete embolization of the distal inferior rectal artery and its branches. No persistent hyperemia was observed. The microcatheter was removed. The C2 catheter was advanced to un formed a Waltman loop and removed. A 6 French Angio-Seal was then placed and deployed without complication or change in pulses. The patient tolerated the procedure well and remained stable throughout the procedure. The patient was transferred to the floor in stable condition. IMPRESSION: 1. Possible focal extravasation versus hyperemia about a distal branch of the right inferior rectal artery. 2. Successful Gel-Foam slurry embolization of the distal right inferior rectal artery. Ruthann Cancer, MD [facility] Radiology Electronically Signed   By: Ruthann Cancer M.D.   On: 11/02/2021 16:47   IR Angiogram Selective Each Additional  Vessel  Result Date: 11/02/2021 INDICATION: 65 year old male status post recent transrectal prostate biopsy presenting with hematochezia, anemia, and CT arteriogram evidence of acute hemorrhage from the rectum. EXAM: 1. Ultrasound-guided vascular access of the right common femoral artery. 2. Abdominal aortogram. 3. Selective catheterization and angiography of the inferior mesenteric artery, superior rectal artery, bilateral internal iliac arteries, and bilateral inferior rectal arteries 4. Gel-Foam embolization of the distal right inferior rectal artery MEDICATIONS: None. ANESTHESIA/SEDATION: Moderate (conscious) sedation was employed during this procedure. A total of Versed 0 mg and Fentanyl 0 mcg was administered intravenously. Moderate Sedation Time: 0 minutes. The patient's level of consciousness and vital signs were monitored continuously by radiology nursing throughout the procedure under my direct supervision. CONTRAST:  56mL OMNIPAQUE IOHEXOL 300 MG/ML SOLN, 84mL OMNIPAQUE IOHEXOL 300 MG/ML SOLN, 36mL OMNIPAQUE IOHEXOL 300 MG/ML SOLN FLUOROSCOPY TIME:  Fluoroscopy Time: 31 minutes 12 seconds (2,778 mGy). COMPLICATIONS: None immediate. PROCEDURE: Informed consent was obtained from the patient following explanation of the procedure, risks, benefits and alternatives. The patient understands, agrees and consents for the procedure. All questions were addressed. A time out was performed prior to the initiation of the procedure. Maximal barrier sterile technique utilized including caps, mask, sterile gowns, sterile gloves, large sterile drape, hand hygiene, and Betadine prep. The right groin was prepped and draped in standard fashion. Preprocedure ultrasound evaluation demonstrated patency of the right common femoral artery. A permanent image was captured and stored in the record. Subdermal Local anesthesia was provided at  the planned needle entry site with 1% lidocaine. A small skin nick was made. Under direct  ultrasound visualization, the right common femoral artery was accessed with standard micropuncture technique. A limited right lower extremity angiogram was performed which demonstrated adequate puncture site for closure device use. A J wire was directed to the abdominal aorta and exchanged for a 5 French, 11 cm vascular sheath. A RIM catheter was introduced and was attempted to be used to engage the IMA however was unsuccessful. Abdominal aortogram was then performed in a steep obliquity with a pigtail flush catheter to demonstrate the ostium of the IMA. The pigtail catheter was then exchanged for a Mickelson catheter which successfully engaged the IMA ostium. A Cook cantata microcatheter and a synchro soft microwire were then inserted into the proximal IMA. IMA angiogram was then performed which demonstrated patency of the superior rectal artery and its branches. There is mild hyperemia in the rectum without definite evidence of active extravasation. The microcatheter was removed and the Mickelson catheter was redirected to select the left common iliac artery. The catheter was exchanged over a Glidewire for a C2 catheter. The C2 catheter was uses likely internal iliac artery. Internal iliac angiogram was performed which demonstrated patent anterior and posterior proximal branches and in inferior rectal artery arising from the proximal anterior division. The microcatheter and wire were then used to select the inferior rectal artery proximally. Angiogram was performed which demonstrated no evidence of active extravasation. A Waltman loop was formed and the C2 catheter was used to select the right internal iliac artery. Angiogram of the right internal iliac artery demonstrated patent anterior posterior division within inferior rectal artery arising from the proximal aspect of the anterior division. The microcatheter and wire were used to select the inferior rectal artery. Angiogram demonstrated a possible focus of  extravasation arising from a more superior branch of the distal inferior rectal artery. The distal branch was selected with the microcatheter. Additional angiogram was performed which demonstrated no evidence of active extravasation spilling into the rectum, however there was hyperemia and irregularity of the vessels in this region. Therefore, Gel-Foam embolization was performed using approximately 1 mL of Gel-Foam slurry. The catheter was retracted slightly and additional angiogram was performed which demonstrated complete embolization of the distal inferior rectal artery and its branches. No persistent hyperemia was observed. The microcatheter was removed. The C2 catheter was advanced to un formed a Waltman loop and removed. A 6 French Angio-Seal was then placed and deployed without complication or change in pulses. The patient tolerated the procedure well and remained stable throughout the procedure. The patient was transferred to the floor in stable condition. IMPRESSION: 1. Possible focal extravasation versus hyperemia about a distal branch of the right inferior rectal artery. 2. Successful Gel-Foam slurry embolization of the distal right inferior rectal artery. Ruthann Cancer, MD [facility] Radiology Electronically Signed   By: Ruthann Cancer M.D.   On: 11/02/2021 16:47   IR Angiogram Selective Each Additional Vessel  Result Date: 11/02/2021 INDICATION: 65 year old male status post recent transrectal prostate biopsy presenting with hematochezia, anemia, and CT arteriogram evidence of acute hemorrhage from the rectum. EXAM: 1. Ultrasound-guided vascular access of the right common femoral artery. 2. Abdominal aortogram. 3. Selective catheterization and angiography of the inferior mesenteric artery, superior rectal artery, bilateral internal iliac arteries, and bilateral inferior rectal arteries 4. Gel-Foam embolization of the distal right inferior rectal artery  MEDICATIONS: None. ANESTHESIA/SEDATION: Moderate (conscious) sedation was employed during this  procedure. A total of Versed 0 mg and Fentanyl 0 mcg was administered intravenously. Moderate Sedation Time: 0 minutes. The patient's level of consciousness and vital signs were monitored continuously by radiology nursing throughout the procedure under my direct supervision. CONTRAST:  53mL OMNIPAQUE IOHEXOL 300 MG/ML SOLN, 53mL OMNIPAQUE IOHEXOL 300 MG/ML SOLN, 47mL OMNIPAQUE IOHEXOL 300 MG/ML SOLN FLUOROSCOPY TIME:  Fluoroscopy Time: 31 minutes 12 seconds (2,778 mGy). COMPLICATIONS: None immediate. PROCEDURE: Informed consent was obtained from the patient following explanation of the procedure, risks, benefits and alternatives. The patient understands, agrees and consents for the procedure. All questions were addressed. A time out was performed prior to the initiation of the procedure. Maximal barrier sterile technique utilized including caps, mask, sterile gowns, sterile gloves, large sterile drape, hand hygiene, and Betadine prep. The right groin was prepped and draped in standard fashion. Preprocedure ultrasound evaluation demonstrated patency of the right common femoral artery. A permanent image was captured and stored in the record. Subdermal Local anesthesia was provided at the planned needle entry site with 1% lidocaine. A small skin nick was made. Under direct ultrasound visualization, the right common femoral artery was accessed with standard micropuncture technique. A limited right lower extremity angiogram was performed which demonstrated adequate puncture site for closure device use. A J wire was directed to the abdominal aorta and exchanged for a 5 French, 11 cm vascular sheath. A RIM catheter was introduced and was attempted to be used to engage the IMA however was unsuccessful. Abdominal aortogram was then performed in a steep obliquity with a pigtail flush catheter to demonstrate the ostium of the IMA.  The pigtail catheter was then exchanged for a Mickelson catheter which successfully engaged the IMA ostium. A Cook cantata microcatheter and a synchro soft microwire were then inserted into the proximal IMA. IMA angiogram was then performed which demonstrated patency of the superior rectal artery and its branches. There is mild hyperemia in the rectum without definite evidence of active extravasation. The microcatheter was removed and the Mickelson catheter was redirected to select the left common iliac artery. The catheter was exchanged over a Glidewire for a C2 catheter. The C2 catheter was uses likely internal iliac artery. Internal iliac angiogram was performed which demonstrated patent anterior and posterior proximal branches and in inferior rectal artery arising from the proximal anterior division. The microcatheter and wire were then used to select the inferior rectal artery proximally. Angiogram was performed which demonstrated no evidence of active extravasation. A Waltman loop was formed and the C2 catheter was used to select the right internal iliac artery. Angiogram of the right internal iliac artery demonstrated patent anterior posterior division within inferior rectal artery arising from the proximal aspect of the anterior division. The microcatheter and wire were used to select the inferior rectal artery. Angiogram demonstrated a possible focus of extravasation arising from a more superior branch of the distal inferior rectal artery. The distal branch was selected with the microcatheter. Additional angiogram was performed which demonstrated no evidence of active extravasation spilling into the rectum, however there was hyperemia and irregularity of the vessels in this region. Therefore, Gel-Foam embolization was performed using approximately 1 mL of Gel-Foam slurry. The catheter was retracted slightly and additional angiogram was performed which demonstrated complete embolization of the distal  inferior rectal artery and its branches. No persistent hyperemia was observed. The microcatheter was removed. The C2 catheter was advanced to un formed a Waltman loop and removed. A 6 French Angio-Seal was then placed and deployed  without complication or change in pulses. The patient tolerated the procedure well and remained stable throughout the procedure. The patient was transferred to the floor in stable condition. IMPRESSION: 1. Possible focal extravasation versus hyperemia about a distal branch of the right inferior rectal artery. 2. Successful Gel-Foam slurry embolization of the distal right inferior rectal artery. Ruthann Cancer, MD [facility] Radiology Electronically Signed   By: Ruthann Cancer M.D.   On: 11/02/2021 16:47   IR US Guide Vasc Access Right  Result Date: 11/02/2021 INDICATION: 65 year old male status post recent transrectal prostate biopsy presenting with hematochezia, anemia, and CT arteriogram evidence of acute hemorrhage from the rectum. EXAM: 1. Ultrasound-guided vascular access of the right common femoral artery. 2. Abdominal aortogram. 3. Selective catheterization and angiography of the inferior mesenteric artery, superior rectal artery, bilateral internal iliac arteries, and bilateral inferior rectal arteries 4. Gel-Foam embolization of the distal right inferior rectal artery MEDICATIONS: None. ANESTHESIA/SEDATION: Moderate (conscious) sedation was employed during this procedure. A total of Versed 0 mg and Fentanyl 0 mcg was administered intravenously. Moderate Sedation Time: 0 minutes. The patient's level of consciousness and vital signs were monitored continuously by radiology nursing throughout the procedure under my direct supervision. CONTRAST:  52mL OMNIPAQUE IOHEXOL 300 MG/ML SOLN, 24mL OMNIPAQUE IOHEXOL 300 MG/ML SOLN, 26mL OMNIPAQUE IOHEXOL 300 MG/ML SOLN FLUOROSCOPY TIME:  Fluoroscopy Time: 31 minutes 12 seconds (2,778 mGy).  COMPLICATIONS: None immediate. PROCEDURE: Informed consent was obtained from the patient following explanation of the procedure, risks, benefits and alternatives. The patient understands, agrees and consents for the procedure. All questions were addressed. A time out was performed prior to the initiation of the procedure. Maximal barrier sterile technique utilized including caps, mask, sterile gowns, sterile gloves, large sterile drape, hand hygiene, and Betadine prep. The right groin was prepped and draped in standard fashion. Preprocedure ultrasound evaluation demonstrated patency of the right common femoral artery. A permanent image was captured and stored in the record. Subdermal Local anesthesia was provided at the planned needle entry site with 1% lidocaine. A small skin nick was made. Under direct ultrasound visualization, the right common femoral artery was accessed with standard micropuncture technique. A limited right lower extremity angiogram was performed which demonstrated adequate puncture site for closure device use. A J wire was directed to the abdominal aorta and exchanged for a 5 French, 11 cm vascular sheath. A RIM catheter was introduced and was attempted to be used to engage the IMA however was unsuccessful. Abdominal aortogram was then performed in a steep obliquity with a pigtail flush catheter to demonstrate the ostium of the IMA. The pigtail catheter was then exchanged for a Mickelson catheter which successfully engaged the IMA ostium. A Cook cantata microcatheter and a synchro soft microwire were then inserted into the proximal IMA. IMA angiogram was then performed which demonstrated patency of the superior rectal artery and its branches. There is mild hyperemia in the rectum without definite evidence of active extravasation. The microcatheter was removed and the Mickelson catheter was redirected to select the left common iliac artery. The catheter was exchanged over a Glidewire for a C2  catheter. The C2 catheter was uses likely internal iliac artery. Internal iliac angiogram was performed which demonstrated patent anterior and posterior proximal branches and in inferior rectal artery arising from the proximal anterior division. The microcatheter and wire were then used to select the inferior rectal artery proximally. Angiogram was performed which demonstrated no evidence of active extravasation. A Waltman loop  was formed and the C2 catheter was used to select the right internal iliac artery. Angiogram of the right internal iliac artery demonstrated patent anterior posterior division within inferior rectal artery arising from the proximal aspect of the anterior division. The microcatheter and wire were used to select the inferior rectal artery. Angiogram demonstrated a possible focus of extravasation arising from a more superior branch of the distal inferior rectal artery. The distal branch was selected with the microcatheter. Additional angiogram was performed which demonstrated no evidence of active extravasation spilling into the rectum, however there was hyperemia and irregularity of the vessels in this region. Therefore, Gel-Foam embolization was performed using approximately 1 mL of Gel-Foam slurry. The catheter was retracted slightly and additional angiogram was performed which demonstrated complete embolization of the distal inferior rectal artery and its branches. No persistent hyperemia was observed. The microcatheter was removed. The C2 catheter was advanced to un formed a Waltman loop and removed. A 6 French Angio-Seal was then placed and deployed without complication or change in pulses. The patient tolerated the procedure well and remained stable throughout the procedure. The patient was transferred to the floor in stable condition. IMPRESSION: 1. Possible focal extravasation versus hyperemia about a distal branch of the right inferior rectal artery. 2. Successful Gel-Foam slurry  embolization of the distal right inferior rectal artery. Ruthann Cancer, MD [facility] Radiology Electronically Signed   By: Ruthann Cancer M.D.   On: 11/02/2021 16:47   ECHOCARDIOGRAM COMPLETE  Result Date: 11/03/2021    ECHOCARDIOGRAM REPORT   Patient Name:   JOTHAM AHN Date of Exam: 11/03/2021 Medical Rec #:  [id number]       Height:       74.0 in Accession #:    [id number]      Weight:       250.0 lb Date of Birth:  1956/06/15       BSA:          2.390 m Patient Age:    33 years        BP:           118/73 mmHg Patient Gender: M               HR:           82 bpm. Exam Location:  Inpatient Procedure: 2D Echo, Color Doppler and Cardiac Doppler Indications:    Chest pain  History:        Patient has prior history of Echocardiogram examinations. Risk                 Factors:Hypertension, Diabetes and Sleep Apnea.  Sonographer:    Jyl Heinz Referring Phys: [id number] Speed  1. Left ventricular ejection fraction, by estimation, is 65 to 70%. The left ventricle has normal function. The left ventricle has no regional wall motion abnormalities. There is mild concentric left ventricular hypertrophy. Left ventricular diastolic parameters are consistent with Grade I diastolic dysfunction (impaired relaxation).  2. Right ventricular systolic function is normal. The right ventricular size is normal. Tricuspid regurgitation signal is inadequate for assessing PA pressure.  3. The mitral valve is grossly normal. No evidence of mitral valve regurgitation. No evidence of mitral stenosis.  4. The aortic valve is tricuspid. Aortic valve regurgitation is not visualized. No aortic stenosis is present.  5. Aortic dilatation noted. There is mild dilatation of the aortic root, measuring 40 mm. Conclusion(s)/Recommendation(s): Normal biventricular function without evidence of  hemodynamically significant valvular heart disease. FINDINGS  Left Ventricle: Left  ventricular ejection fraction, by estimation, is 65 to 70%. The left ventricle has normal function. The left ventricle has no regional wall motion abnormalities. The left ventricular internal cavity size was normal in size. There is  mild concentric left ventricular hypertrophy. Left ventricular diastolic parameters are consistent with Grade I diastolic dysfunction (impaired relaxation). Right Ventricle: The right ventricular size is normal. No increase in right ventricular wall thickness. Right ventricular systolic function is normal. Tricuspid regurgitation signal is inadequate for assessing PA pressure. Left Atrium: Left atrial size was normal in size. Right Atrium: Right atrial size was normal in size. Pericardium: There is no evidence of pericardial effusion. Mitral Valve: The mitral valve is grossly normal. Mild mitral annular calcification. No evidence of mitral valve regurgitation. No evidence of mitral valve stenosis. Tricuspid Valve: The tricuspid valve is grossly normal. Tricuspid valve regurgitation is trivial. No evidence of tricuspid stenosis. Aortic Valve: The aortic valve is tricuspid. Aortic valve regurgitation is not visualized. No aortic stenosis is present. Aortic valve mean gradient measures 10.0 mmHg. Aortic valve peak gradient measures 17.5 mmHg. Aortic valve area, by VTI measures 4.49 cm. Pulmonic Valve: The pulmonic valve was grossly normal. Pulmonic valve regurgitation is not visualized. No evidence of pulmonic stenosis. Aorta: Aortic dilatation noted. There is mild dilatation of the aortic root, measuring 40 mm. Venous: The inferior vena cava was not well visualized. IAS/Shunts: The atrial septum is grossly normal.  LEFT VENTRICLE PLAX 2D LVIDd:         4.10 cm     Diastology LVIDs:         2.50 cm     LV e' medial:    8.55 cm/s LV PW:         1.30 cm     LV E/e' medial:  10.2 LV IVS:        1.30 cm     LV e' lateral:   8.38 cm/s LVOT diam:     2.40 cm     LV E/e' lateral: 10.4 LV SV:          159 LV SV Index:   67 LVOT Area:     4.52 cm  LV Volumes (MOD) LV vol d, MOD A2C: 97.2 ml LV vol d, MOD A4C: 92.8 ml LV vol s, MOD A2C: 32.5 ml LV vol s, MOD A4C: 33.3 ml LV SV MOD A2C:     64.7 ml LV SV MOD A4C:     92.8 ml LV SV MOD BP:      63.0 ml RIGHT VENTRICLE RV Basal diam:  2.90 cm RV Mid diam:    2.60 cm RV S prime:     12.60 cm/s TAPSE (M-mode): 2.5 cm LEFT ATRIUM             Index        RIGHT ATRIUM           Index LA diam:        3.20 cm 1.34 cm/m   RA Area:     18.70 cm LA Vol (A2C):   47.4 ml 19.84 ml/m  RA Volume:   54.50 ml  22.81 ml/m LA Vol (A4C):   36.8 ml 15.40 ml/m LA Biplane Vol: 43.5 ml 18.20 ml/m  AORTIC VALVE AV Area (Vmax):    3.96 cm AV Area (Vmean):   4.21 cm AV Area (VTI):     4.49 cm AV Vmax:  209.00 cm/s AV Vmean:          152.500 cm/s AV VTI:            0.355 m AV Peak Grad:      17.5 mmHg AV Mean Grad:      10.0 mmHg LVOT Vmax:         183.00 cm/s LVOT Vmean:        142.000 cm/s LVOT VTI:          0.352 m LVOT/AV VTI ratio: 0.99  AORTA Ao Root diam: 4.00 cm Ao Asc diam:  3.40 cm MITRAL VALVE MV Area (PHT): 3.36 cm     SHUNTS MV Decel Time: 226 msec     Systemic VTI:  0.35 m MV E velocity: 86.90 cm/s   Systemic Diam: 2.40 cm MV A velocity: 128.00 cm/s MV E/A ratio:  0.68 Eleonore Chiquito MD Electronically signed by Eleonore Chiquito MD Signature Date/Time: 11/03/2021/3:38:33 PM    Final    IR EMBO ART  VEN HEMORR LYMPH EXTRAV  INC GUIDE ROADMAPPING  Result Date: 11/02/2021 INDICATION: 65 year old male status post recent transrectal prostate biopsy presenting with hematochezia, anemia, and CT arteriogram evidence of acute hemorrhage from the rectum. EXAM: 1. Ultrasound-guided vascular access of the right common femoral artery. 2. Abdominal aortogram. 3. Selective catheterization and angiography of the inferior mesenteric artery, superior rectal artery, bilateral internal iliac arteries, and bilateral inferior rectal arteries 4. Gel-Foam embolization of the  distal right inferior rectal artery MEDICATIONS: None. ANESTHESIA/SEDATION: Moderate (conscious) sedation was employed during this procedure. A total of Versed 0 mg and Fentanyl 0 mcg was administered intravenously. Moderate Sedation Time: 0 minutes. The patient's level of consciousness and vital signs were monitored continuously by radiology nursing throughout the procedure under my direct supervision. CONTRAST:  39mL OMNIPAQUE IOHEXOL 300 MG/ML SOLN, 59mL OMNIPAQUE IOHEXOL 300 MG/ML SOLN, 65mL OMNIPAQUE IOHEXOL 300 MG/ML SOLN FLUOROSCOPY TIME:  Fluoroscopy Time: 31 minutes 12 seconds (2,778 mGy). COMPLICATIONS: None immediate. PROCEDURE: Informed consent was obtained from the patient following explanation of the procedure, risks, benefits and alternatives. The patient understands, agrees and consents for the procedure. All questions were addressed. A time out was performed prior to the initiation of the procedure. Maximal barrier sterile technique utilized including caps, mask, sterile gowns, sterile gloves, large sterile drape, hand hygiene, and Betadine prep. The right groin was prepped and draped in standard fashion. Preprocedure ultrasound evaluation demonstrated patency of the right common femoral artery. A permanent image was captured and stored in the record. Subdermal Local anesthesia was provided at the planned needle entry site with 1% lidocaine. A small skin nick was made. Under direct ultrasound visualization, the right common femoral artery was accessed with standard micropuncture technique. A limited right lower extremity angiogram was performed which demonstrated adequate puncture site for closure device use. A J wire was directed to the abdominal aorta and exchanged for a 5 French, 11 cm vascular sheath. A RIM catheter was introduced and was attempted to be used to engage the IMA however was unsuccessful. Abdominal aortogram was then performed in a steep obliquity with a pigtail flush catheter to  demonstrate the ostium of the IMA. The pigtail catheter was then exchanged for a Mickelson catheter which successfully engaged the IMA ostium. A Cook cantata microcatheter and a synchro soft microwire were then inserted into the proximal IMA. IMA angiogram was then performed which demonstrated patency of the superior rectal artery and its branches. There is mild hyperemia in the rectum without  definite evidence of active extravasation. The microcatheter was removed and the Mickelson catheter was redirected to select the left common iliac artery. The catheter was exchanged over a Glidewire for a C2 catheter. The C2 catheter was uses likely internal iliac artery. Internal iliac angiogram was performed which demonstrated patent anterior and posterior proximal branches and in inferior rectal artery arising from the proximal anterior division. The microcatheter and wire were then used to select the inferior rectal artery proximally. Angiogram was performed which demonstrated no evidence of active extravasation. A Waltman loop was formed and the C2 catheter was used to select the right internal iliac artery. Angiogram of the right internal iliac artery demonstrated patent anterior posterior division within inferior rectal artery arising from the proximal aspect of the anterior division. The microcatheter and wire were used to select the inferior rectal artery. Angiogram demonstrated a possible focus of extravasation arising from a more superior branch of the distal inferior rectal artery. The distal branch was selected with the microcatheter. Additional angiogram was performed which demonstrated no evidence of active extravasation spilling into the rectum, however there was hyperemia and irregularity of the vessels in this region. Therefore, Gel-Foam embolization was performed using approximately 1 mL of Gel-Foam slurry. The catheter was retracted slightly and additional angiogram was performed which demonstrated complete  embolization of the distal inferior rectal artery and its branches. No persistent hyperemia was observed. The microcatheter was removed. The C2 catheter was advanced to un formed a Waltman loop and removed. A 6 French Angio-Seal was then placed and deployed without complication or change in pulses. The patient tolerated the procedure well and remained stable throughout the procedure. The patient was transferred to the floor in stable condition. IMPRESSION: 1. Possible focal extravasation versus hyperemia about a distal branch of the right inferior rectal artery. 2. Successful Gel-Foam slurry embolization of the distal right inferior rectal artery. Ruthann Cancer, MD [facility] Radiology Electronically Signed   By: Ruthann Cancer M.D.   On: 11/02/2021 16:47        Scheduled Meds:  atorvastatin  80 mg Oral QHS   folic acid  1 mg Oral Daily   gabapentin  200 mg Oral QHS   insulin aspart  0-9 Units Subcutaneous Q6H   lidocaine       pantoprazole (PROTONIX) IV  40 mg Intravenous Q12H   sodium phosphate  1 enema Rectal Once   vitamin B-12  1,000 mcg Oral Daily   Continuous Infusions:  sodium chloride 125 mL/hr at 11/03/21 2137   lactated ringers 10 mL/hr at 11/03/21 1919     LOS: 1 day        Hosie Poisson, MD [facility]   To contact the attending provider between 7A-7P or the covering provider during after hours 7P-7A, please log into the web site [email or web address] and access using universal [place] password for that web site. If you do not have the password, please call the hospital operator.  11/04/2021, 12:53 PM

## 2021-11-04 NOTE — Sedation Documentation (Signed)
Patient is resting comfortably, snoring lightly, VSS, in NAD.

## 2021-11-04 NOTE — Anesthesia Postprocedure Evaluation (Signed)
Anesthesia Post Note  Patient: [name]  Procedure(s) Performed: [place]     Patient location during evaluation: PACU Anesthesia Type: MAC Level of consciousness: awake and alert Pain management: pain level controlled Vital Signs Assessment: post-procedure vital signs reviewed and stable Respiratory status: spontaneous breathing, nonlabored ventilation and respiratory function stable Cardiovascular status: stable and blood pressure returned to baseline Postop Assessment: no apparent nausea or vomiting Anesthetic complications: no   No notable events documented.  Last Vitals:  Vitals:   11/04/21 1027 11/04/21 1615  BP: 120/64 131/62  Pulse: 69 69  Resp: 16 18  Temp: 36.9 C 37.8 C  SpO2: 97% 100%    Last Pain:  Vitals:   11/04/21 1850  TempSrc:   PainSc: Asleep                 Catalina Gravel

## 2021-11-04 NOTE — Progress Notes (Addendum)
Dr. Loletha Carrow called to follow up on patient current hemoglobin of 4.9, BP 103/62, HR 71, MAP 73. Provider notes IR will be contacted. Patients denies feelings of dizziness and lightheadedness. Tele monitor intact, pt instructed to remain in bed. Pt is Jehovah witness and refuses blood products. Will continue to monitor patient.

## 2021-11-04 NOTE — Procedures (Signed)
Interventional Radiology Procedure Note  Procedure:  1) Mesenteric and pelvic angiogram 2) Particle embolization of the superior rectal artery 3) Gelfoam embolization of the anterior division of the right internal iliac artery  Findings: Please refer to procedural dictation for full description.  No active extravasation visualized from superior rectal or bilateral inferior rectal arteries.  Prophylactic particle (500-700 micron) embolization of the proximal superior rectal artery.  Left inferior rectal artery difficult to visualize.  Prophylatic gelfoam slurry embolization of the anterior division of the right internal iliac artery.  6 Fr right CFA Angioseal closure.  Complications: None immediate.  Estimated Blood Loss: 10 mL  Recommendations: Strict 4 hour bedrest (head of bed flat until 11:30, up to 30 degrees until 13:30).   Continue to monitor hemodynamics. IR will follow along.     Ruthann Cancer, MD Pager: [phone]

## 2021-11-04 NOTE — Progress Notes (Signed)
Chart check indicates Hgb 5.2 with 1030pm draw.  I spoke to patient's nurse just now, no call was made to medicine, GI or IR physicians with result.  Patient's SBP reportedly 103 with pulse in 70's last check about 5am.  AM Hgb reportedly 4.9 just resulted (nurse rec'd a call from lab)  This indicates patient continues to bleed.  I spoke with Dr. Dwaine Gale from IR, he will make plans for angiogram this AM.  He also requested notification of surgical team in the event that IR intervention unsuccessful.  I spoke to Dr. Nedra Hai, who will alert [facility] daytime consult physician (White).  - H. Loletha Carrow, MD

## 2021-11-04 NOTE — Progress Notes (Addendum)
Patient ID: [name], male   DOB: 03/03/1956, 65 y.o.   MRN: [id number]    Progress Note   Subjective  Day # 3 CC; acute GI bleed/bright red blood per rectum  Patient had rebleeding last evening/also passage of old clots  Flexible sigmoidoscopy-blood in the rectum and sigmoid colon, copious clots and adherent black blood, no active bleeding source identified, no biopsy site identified  Hemoglobin 6.6 last p.m. down to 5.2, then 4.9 early this a.m. INR 1.2/pro time 15.6  IR has reevaluated-has just returned to the floor post mesenteric and pelvic angiogram with particle embolization of the superior rectal artery and Gelfoam embolization of the anterior division of the right internal iliac artery.  There was no active extravasation at the time of the procedure.  Patient is resting comfortably, no complaints of pain, no chest pain or shortness of breath Asking about Epogen   Objective   Vital signs in last 24 hours: Temp:  [98.6 F (37 C)-98.9 F (37.2 C)] 98.7 F (37.1 C) (11/30 0453) Pulse Rate:  [69-93] 69 (11/30 0845) Resp:  [16-25] 16 (11/30 0845) BP: (103-128)/(43-72) 109/69 (11/30 0845) SpO2:  [94 %-100 %] 99 % (11/30 0845) Weight:  [113.4 kg-122 kg] 122 kg (11/30 0453) Last BM Date: 11/03/21 General:   Older African-American male in NAD Heart:  Regular rate and rhythm; no murmurs Lungs: Respirations even and unlabored, lungs CTA bilaterally Abdomen:  Soft, nontender and nondistended. Normal bowel sounds. Extremities:  Without edema. Neurologic:  Alert and oriented,  grossly normal neurologically. Psych:  Cooperative. Normal mood and affect.  Intake/Output from previous day: 11/29 0701 - 11/30 0700 In: 2394.5 [I.V.:2144.5; IV Piggyback:250] Out: 1600 [Urine:1600] Intake/Output this shift: No intake/output data recorded.  Lab Results: Recent Labs    11/02/21 0340 11/02/21 1224 11/03/21 0455 11/03/21 1142 11/03/21 1737 11/03/21 2137 11/04/21 0501  WBC  6.0  --  7.8  --   --   --   --   HGB 11.5*   < > 6.8*   < > 6.6* 5.2* 4.9*  HCT 37.7*   < > 22.2*   < > 21.3* 16.9* 15.5*  PLT 191  --  146*  --   --   --   --    < > = values in this interval not displayed.   BMET Recent Labs    11/02/21 0340 11/03/21 0455 11/04/21 0501  NA 139 134* 135  K 4.9 4.5 4.0  CL 105 107 110  CO2 25 21* 24  GLUCOSE 101* 168* 117*  BUN 31* 36* 25*  CREATININE 1.29* 1.67* 1.26*  CALCIUM 8.9 8.1* 7.6*   LFT Recent Labs    11/03/21 0455  PROT 4.9*  ALBUMIN 3.1*  AST 28  ALT 27  ALKPHOS 37*  BILITOT 0.5   PT/INR Recent Labs    11/02/21 0638 11/03/21 2137  LABPROT 18.4* 15.6*  INR 1.5* 1.2    Studies/Results: IR Angiogram Visceral Selective  Result Date: 11/02/2021 INDICATION: 65 year old male status post recent transrectal prostate biopsy presenting with hematochezia, anemia, and CT arteriogram evidence of acute hemorrhage from the rectum. EXAM: 1. Ultrasound-guided vascular access of the right common femoral artery. 2. Abdominal aortogram. 3. Selective catheterization and angiography of the inferior mesenteric artery, superior rectal artery, bilateral internal iliac arteries, and bilateral inferior rectal arteries 4. Gel-Foam embolization of the distal right inferior rectal artery MEDICATIONS: None. ANESTHESIA/SEDATION: Moderate (conscious) sedation was employed during this procedure. A total of Versed 0 mg and  Fentanyl 0 mcg was administered intravenously. Moderate Sedation Time: 0 minutes. The patient's level of consciousness and vital signs were monitored continuously by radiology nursing throughout the procedure under my direct supervision. CONTRAST:  83mL OMNIPAQUE IOHEXOL 300 MG/ML SOLN, 71mL OMNIPAQUE IOHEXOL 300 MG/ML SOLN, 50mL OMNIPAQUE IOHEXOL 300 MG/ML SOLN FLUOROSCOPY TIME:  Fluoroscopy Time: 31 minutes 12 seconds (2,778 mGy). COMPLICATIONS: None immediate. PROCEDURE: Informed consent was obtained from the patient following explanation  of the procedure, risks, benefits and alternatives. The patient understands, agrees and consents for the procedure. All questions were addressed. A time out was performed prior to the initiation of the procedure. Maximal barrier sterile technique utilized including caps, mask, sterile gowns, sterile gloves, large sterile drape, hand hygiene, and Betadine prep. The right groin was prepped and draped in standard fashion. Preprocedure ultrasound evaluation demonstrated patency of the right common femoral artery. A permanent image was captured and stored in the record. Subdermal Local anesthesia was provided at the planned needle entry site with 1% lidocaine. A small skin nick was made. Under direct ultrasound visualization, the right common femoral artery was accessed with standard micropuncture technique. A limited right lower extremity angiogram was performed which demonstrated adequate puncture site for closure device use. A J wire was directed to the abdominal aorta and exchanged for a 5 French, 11 cm vascular sheath. A RIM catheter was introduced and was attempted to be used to engage the IMA however was unsuccessful. Abdominal aortogram was then performed in a steep obliquity with a pigtail flush catheter to demonstrate the ostium of the IMA. The pigtail catheter was then exchanged for a Mickelson catheter which successfully engaged the IMA ostium. A Cook cantata microcatheter and a synchro soft microwire were then inserted into the proximal IMA. IMA angiogram was then performed which demonstrated patency of the superior rectal artery and its branches. There is mild hyperemia in the rectum without definite evidence of active extravasation. The microcatheter was removed and the Mickelson catheter was redirected to select the left common iliac artery. The catheter was exchanged over a Glidewire for a C2 catheter. The C2 catheter was uses likely internal iliac artery. Internal iliac angiogram was performed which  demonstrated patent anterior and posterior proximal branches and in inferior rectal artery arising from the proximal anterior division. The microcatheter and wire were then used to select the inferior rectal artery proximally. Angiogram was performed which demonstrated no evidence of active extravasation. A Waltman loop was formed and the C2 catheter was used to select the right internal iliac artery. Angiogram of the right internal iliac artery demonstrated patent anterior posterior division within inferior rectal artery arising from the proximal aspect of the anterior division. The microcatheter and wire were used to select the inferior rectal artery. Angiogram demonstrated a possible focus of extravasation arising from a more superior branch of the distal inferior rectal artery. The distal branch was selected with the microcatheter. Additional angiogram was performed which demonstrated no evidence of active extravasation spilling into the rectum, however there was hyperemia and irregularity of the vessels in this region. Therefore, Gel-Foam embolization was performed using approximately 1 mL of Gel-Foam slurry. The catheter was retracted slightly and additional angiogram was performed which demonstrated complete embolization of the distal inferior rectal artery and its branches. No persistent hyperemia was observed. The microcatheter was removed. The C2 catheter was advanced to un formed a Waltman loop and removed. A 6 French Angio-Seal was then placed and deployed without complication or change in pulses. The patient  tolerated the procedure well and remained stable throughout the procedure. The patient was transferred to the floor in stable condition. IMPRESSION: 1. Possible focal extravasation versus hyperemia about a distal branch of the right inferior rectal artery. 2. Successful Gel-Foam slurry embolization of the distal right inferior rectal artery. Ruthann Cancer, MD [facility] Radiology Electronically Signed   By: Ruthann Cancer M.D.   On: 11/02/2021 16:47   IR Angiogram Pelvis Selective Or Supraselective  Result Date: 11/02/2021 INDICATION: 65 year old male status post recent transrectal prostate biopsy presenting with hematochezia, anemia, and CT arteriogram evidence of acute hemorrhage from the rectum. EXAM: 1. Ultrasound-guided vascular access of the right common femoral artery. 2. Abdominal aortogram. 3. Selective catheterization and angiography of the inferior mesenteric artery, superior rectal artery, bilateral internal iliac arteries, and bilateral inferior rectal arteries 4. Gel-Foam embolization of the distal right inferior rectal artery MEDICATIONS: None. ANESTHESIA/SEDATION: Moderate (conscious) sedation was employed during this procedure. A total of Versed 0 mg and Fentanyl 0 mcg was administered intravenously. Moderate Sedation Time: 0 minutes. The patient's level of consciousness and vital signs were monitored continuously by radiology nursing throughout the procedure under my direct supervision. CONTRAST:  46mL OMNIPAQUE IOHEXOL 300 MG/ML SOLN, 46mL OMNIPAQUE IOHEXOL 300 MG/ML SOLN, 20mL OMNIPAQUE IOHEXOL 300 MG/ML SOLN FLUOROSCOPY TIME:  Fluoroscopy Time: 31 minutes 12 seconds (2,778 mGy). COMPLICATIONS: None immediate. PROCEDURE: Informed consent was obtained from the patient following explanation of the procedure, risks, benefits and alternatives. The patient understands, agrees and consents for the procedure. All questions were addressed. A time out was performed prior to the initiation of the procedure. Maximal barrier sterile technique utilized including caps, mask, sterile gowns, sterile gloves, large sterile drape, hand hygiene, and Betadine prep. The right groin was prepped and draped in standard fashion. Preprocedure ultrasound evaluation demonstrated patency of the right common femoral artery. A permanent image was captured and stored  in the record. Subdermal Local anesthesia was provided at the planned needle entry site with 1% lidocaine. A small skin nick was made. Under direct ultrasound visualization, the right common femoral artery was accessed with standard micropuncture technique. A limited right lower extremity angiogram was performed which demonstrated adequate puncture site for closure device use. A J wire was directed to the abdominal aorta and exchanged for a 5 French, 11 cm vascular sheath. A RIM catheter was introduced and was attempted to be used to engage the IMA however was unsuccessful. Abdominal aortogram was then performed in a steep obliquity with a pigtail flush catheter to demonstrate the ostium of the IMA. The pigtail catheter was then exchanged for a Mickelson catheter which successfully engaged the IMA ostium. A Cook cantata microcatheter and a synchro soft microwire were then inserted into the proximal IMA. IMA angiogram was then performed which demonstrated patency of the superior rectal artery and its branches. There is mild hyperemia in the rectum without definite evidence of active extravasation. The microcatheter was removed and the Mickelson catheter was redirected to select the left common iliac artery. The catheter was exchanged over a Glidewire for a C2 catheter. The C2 catheter was uses likely internal iliac artery. Internal iliac angiogram was performed which demonstrated patent anterior and posterior proximal branches and in inferior rectal artery arising from the proximal anterior division. The microcatheter and wire were then used to select the inferior rectal artery proximally. Angiogram was performed which demonstrated no evidence of active extravasation. A Waltman loop was formed and the C2 catheter was used  to select the right internal iliac artery. Angiogram of the right internal iliac artery demonstrated patent anterior posterior division within inferior rectal artery arising from the proximal aspect  of the anterior division. The microcatheter and wire were used to select the inferior rectal artery. Angiogram demonstrated a possible focus of extravasation arising from a more superior branch of the distal inferior rectal artery. The distal branch was selected with the microcatheter. Additional angiogram was performed which demonstrated no evidence of active extravasation spilling into the rectum, however there was hyperemia and irregularity of the vessels in this region. Therefore, Gel-Foam embolization was performed using approximately 1 mL of Gel-Foam slurry. The catheter was retracted slightly and additional angiogram was performed which demonstrated complete embolization of the distal inferior rectal artery and its branches. No persistent hyperemia was observed. The microcatheter was removed. The C2 catheter was advanced to un formed a Waltman loop and removed. A 6 French Angio-Seal was then placed and deployed without complication or change in pulses. The patient tolerated the procedure well and remained stable throughout the procedure. The patient was transferred to the floor in stable condition. IMPRESSION: 1. Possible focal extravasation versus hyperemia about a distal branch of the right inferior rectal artery. 2. Successful Gel-Foam slurry embolization of the distal right inferior rectal artery. Ruthann Cancer, MD [facility] Radiology Specialists [facility] Radiology Electronically Signed   By: Ruthann Cancer M.D.   On: 11/02/2021 16:47   IR Angiogram Selective Each Additional Vessel  Result Date: 11/02/2021 INDICATION: 65 year old male status post recent transrectal prostate biopsy presenting with hematochezia, anemia, and CT arteriogram evidence of acute hemorrhage from the rectum. EXAM: 1. Ultrasound-guided vascular access of the right common femoral artery. 2. Abdominal aortogram. 3. Selective catheterization and angiography of the inferior mesenteric artery, superior rectal  artery, bilateral internal iliac arteries, and bilateral inferior rectal arteries 4. Gel-Foam embolization of the distal right inferior rectal artery MEDICATIONS: None. ANESTHESIA/SEDATION: Moderate (conscious) sedation was employed during this procedure. A total of Versed 0 mg and Fentanyl 0 mcg was administered intravenously. Moderate Sedation Time: 0 minutes. The patient's level of consciousness and vital signs were monitored continuously by radiology nursing throughout the procedure under my direct supervision. CONTRAST:  61mL OMNIPAQUE IOHEXOL 300 MG/ML SOLN, 32mL OMNIPAQUE IOHEXOL 300 MG/ML SOLN, 26mL OMNIPAQUE IOHEXOL 300 MG/ML SOLN FLUOROSCOPY TIME:  Fluoroscopy Time: 31 minutes 12 seconds (2,778 mGy). COMPLICATIONS: None immediate. PROCEDURE: Informed consent was obtained from the patient following explanation of the procedure, risks, benefits and alternatives. The patient understands, agrees and consents for the procedure. All questions were addressed. A time out was performed prior to the initiation of the procedure. Maximal barrier sterile technique utilized including caps, mask, sterile gowns, sterile gloves, large sterile drape, hand hygiene, and Betadine prep. The right groin was prepped and draped in standard fashion. Preprocedure ultrasound evaluation demonstrated patency of the right common femoral artery. A permanent image was captured and stored in the record. Subdermal Local anesthesia was provided at the planned needle entry site with 1% lidocaine. A small skin nick was made. Under direct ultrasound visualization, the right common femoral artery was accessed with standard micropuncture technique. A limited right lower extremity angiogram was performed which demonstrated adequate puncture site for closure device use. A J wire was directed to the abdominal aorta and exchanged for a 5 French, 11 cm vascular sheath. A RIM catheter was introduced and was attempted to be used to engage the IMA  however was unsuccessful. Abdominal aortogram was then performed  in a steep obliquity with a pigtail flush catheter to demonstrate the ostium of the IMA. The pigtail catheter was then exchanged for a Mickelson catheter which successfully engaged the IMA ostium. A Cook cantata microcatheter and a synchro soft microwire were then inserted into the proximal IMA. IMA angiogram was then performed which demonstrated patency of the superior rectal artery and its branches. There is mild hyperemia in the rectum without definite evidence of active extravasation. The microcatheter was removed and the Mickelson catheter was redirected to select the left common iliac artery. The catheter was exchanged over a Glidewire for a C2 catheter. The C2 catheter was uses likely internal iliac artery. Internal iliac angiogram was performed which demonstrated patent anterior and posterior proximal branches and in inferior rectal artery arising from the proximal anterior division. The microcatheter and wire were then used to select the inferior rectal artery proximally. Angiogram was performed which demonstrated no evidence of active extravasation. A Waltman loop was formed and the C2 catheter was used to select the right internal iliac artery. Angiogram of the right internal iliac artery demonstrated patent anterior posterior division within inferior rectal artery arising from the proximal aspect of the anterior division. The microcatheter and wire were used to select the inferior rectal artery. Angiogram demonstrated a possible focus of extravasation arising from a more superior branch of the distal inferior rectal artery. The distal branch was selected with the microcatheter. Additional angiogram was performed which demonstrated no evidence of active extravasation spilling into the rectum, however there was hyperemia and irregularity of the vessels in this region. Therefore, Gel-Foam embolization was performed using approximately 1 mL of  Gel-Foam slurry. The catheter was retracted slightly and additional angiogram was performed which demonstrated complete embolization of the distal inferior rectal artery and its branches. No persistent hyperemia was observed. The microcatheter was removed. The C2 catheter was advanced to un formed a Waltman loop and removed. A 6 French Angio-Seal was then placed and deployed without complication or change in pulses. The patient tolerated the procedure well and remained stable throughout the procedure. The patient was transferred to the floor in stable condition. IMPRESSION: 1. Possible focal extravasation versus hyperemia about a distal branch of the right inferior rectal artery. 2. Successful Gel-Foam slurry embolization of the distal right inferior rectal artery. Ruthann Cancer, MD [facility] Inter[facility] Radiology Specialists [facility] Radiology Electronically Signed   By: Ruthann Cancer M.D.   On: 11/02/2021 16:47   IR Angiogram Selective Each Additional Vessel  Result Date: 11/02/2021 INDICATION: 65 year old male status post recent transrectal prostate biopsy presenting with hematochezia, anemia, and CT arteriogram evidence of acute hemorrhage from the rectum. EXAM: 1. Ultrasound-guided vascular access of the right common femoral artery. 2. Abdominal aortogram. 3. Selective catheterization and angiography of the inferior mesenteric artery, superior rectal artery, bilateral internal iliac arteries, and bilateral inferior rectal arteries 4. Gel-Foam embolization of the distal right inferior rectal artery MEDICATIONS: None. ANESTHESIA/SEDATION: Moderate (conscious) sedation was employed during this procedure. A total of Versed 0 mg and Fentanyl 0 mcg was administered intravenously. Moderate Sedation Time: 0 minutes. The patient's level of consciousness and vital signs were monitored continuously by radiology nursing throughout the procedure under my direct supervision. CONTRAST:  57mL OMNIPAQUE IOHEXOL 300  MG/ML SOLN, 6mL OMNIPAQUE IOHEXOL 300 MG/ML SOLN, 16mL OMNIPAQUE IOHEXOL 300 MG/ML SOLN FLUOROSCOPY TIME:  Fluoroscopy Time: 31 minutes 12 seconds (2,778 mGy). COMPLICATIONS: None immediate. PROCEDURE: Informed consent was obtained from the patient following explanation of the procedure, risks, benefits  and alternatives. The patient understands, agrees and consents for the procedure. All questions were addressed. A time out was performed prior to the initiation of the procedure. Maximal barrier sterile technique utilized including caps, mask, sterile gowns, sterile gloves, large sterile drape, hand hygiene, and Betadine prep. The right groin was prepped and draped in standard fashion. Preprocedure ultrasound evaluation demonstrated patency of the right common femoral artery. A permanent image was captured and stored in the record. Subdermal Local anesthesia was provided at the planned needle entry site with 1% lidocaine. A small skin nick was made. Under direct ultrasound visualization, the right common femoral artery was accessed with standard micropuncture technique. A limited right lower extremity angiogram was performed which demonstrated adequate puncture site for closure device use. A J wire was directed to the abdominal aorta and exchanged for a 5 French, 11 cm vascular sheath. A RIM catheter was introduced and was attempted to be used to engage the IMA however was unsuccessful. Abdominal aortogram was then performed in a steep obliquity with a pigtail flush catheter to demonstrate the ostium of the IMA. The pigtail catheter was then exchanged for a Mickelson catheter which successfully engaged the IMA ostium. A Cook cantata microcatheter and a synchro soft microwire were then inserted into the proximal IMA. IMA angiogram was then performed which demonstrated patency of the superior rectal artery and its branches. There is mild hyperemia in the rectum without definite evidence of active extravasation. The  microcatheter was removed and the Mickelson catheter was redirected to select the left common iliac artery. The catheter was exchanged over a Glidewire for a C2 catheter. The C2 catheter was uses likely internal iliac artery. Internal iliac angiogram was performed which demonstrated patent anterior and posterior proximal branches and in inferior rectal artery arising from the proximal anterior division. The microcatheter and wire were then used to select the inferior rectal artery proximally. Angiogram was performed which demonstrated no evidence of active extravasation. A Waltman loop was formed and the C2 catheter was used to select the right internal iliac artery. Angiogram of the right internal iliac artery demonstrated patent anterior posterior division within inferior rectal artery arising from the proximal aspect of the anterior division. The microcatheter and wire were used to select the inferior rectal artery. Angiogram demonstrated a possible focus of extravasation arising from a more superior branch of the distal inferior rectal artery. The distal branch was selected with the microcatheter. Additional angiogram was performed which demonstrated no evidence of active extravasation spilling into the rectum, however there was hyperemia and irregularity of the vessels in this region. Therefore, Gel-Foam embolization was performed using approximately 1 mL of Gel-Foam slurry. The catheter was retracted slightly and additional angiogram was performed which demonstrated complete embolization of the distal inferior rectal artery and its branches. No persistent hyperemia was observed. The microcatheter was removed. The C2 catheter was advanced to un formed a Waltman loop and removed. A 6 French Angio-Seal was then placed and deployed without complication or change in pulses. The patient tolerated the procedure well and remained stable throughout the procedure. The patient was transferred to the floor in stable  condition. IMPRESSION: 1. Possible focal extravasation versus hyperemia about a distal branch of the right inferior rectal artery. 2. Successful Gel-Foam slurry embolization of the distal right inferior rectal artery. Ruthann Cancer, MD [facility] Interventional Radiology Specialists [facility] Radiology Electronically Signed   By: Ruthann Cancer M.D.   On: 11/02/2021 16:47   IR Angiogram Selective Each Additional Vessel  Result Date: 11/02/2021 INDICATION: 65 year old male status post recent transrectal prostate biopsy presenting with hematochezia, anemia, and CT arteriogram evidence of acute hemorrhage from the rectum. EXAM: 1. Ultrasound-guided vascular access of the right common femoral artery. 2. Abdominal aortogram. 3. Selective catheterization and angiography of the inferior mesenteric artery, superior rectal artery, bilateral internal iliac arteries, and bilateral inferior rectal arteries 4. Gel-Foam embolization of the distal right inferior rectal artery MEDICATIONS: None. ANESTHESIA/SEDATION: Moderate (conscious) sedation was employed during this procedure. A total of Versed 0 mg and Fentanyl 0 mcg was administered intravenously. Moderate Sedation Time: 0 minutes. The patient's level of consciousness and vital signs were monitored continuously by radiology nursing throughout the procedure under my direct supervision. CONTRAST:  66mL OMNIPAQUE IOHEXOL 300 MG/ML SOLN, 4mL OMNIPAQUE IOHEXOL 300 MG/ML SOLN, 91mL OMNIPAQUE IOHEXOL 300 MG/ML SOLN FLUOROSCOPY TIME:  Fluoroscopy Time: 31 minutes 12 seconds (2,778 mGy). COMPLICATIONS: None immediate. PROCEDURE: Informed consent was obtained from the patient following explanation of the procedure, risks, benefits and alternatives. The patient understands, agrees and consents for the procedure. All questions were addressed. A time out was performed prior to the initiation of the procedure. Maximal barrier sterile technique utilized including caps, mask, sterile  gowns, sterile gloves, large sterile drape, hand hygiene, and Betadine prep. The right groin was prepped and draped in standard fashion. Preprocedure ultrasound evaluation demonstrated patency of the right common femoral artery. A permanent image was captured and stored in the record. Subdermal Local anesthesia was provided at the planned needle entry site with 1% lidocaine. A small skin nick was made. Under direct ultrasound visualization, the right common femoral artery was accessed with standard micropuncture technique. A limited right lower extremity angiogram was performed which demonstrated adequate puncture site for closure device use. A J wire was directed to the abdominal aorta and exchanged for a 5 French, 11 cm vascular sheath. A RIM catheter was introduced and was attempted to be used to engage the IMA however was unsuccessful. Abdominal aortogram was then performed in a steep obliquity with a pigtail flush catheter to demonstrate the ostium of the IMA. The pigtail catheter was then exchanged for a Mickelson catheter which successfully engaged the IMA ostium. A Cook cantata microcatheter and a synchro soft microwire were then inserted into the proximal IMA. IMA angiogram was then performed which demonstrated patency of the superior rectal artery and its branches. There is mild hyperemia in the rectum without definite evidence of active extravasation. The microcatheter was removed and the Mickelson catheter was redirected to select the left common iliac artery. The catheter was exchanged over a Glidewire for a C2 catheter. The C2 catheter was uses likely internal iliac artery. Internal iliac angiogram was performed which demonstrated patent anterior and posterior proximal branches and in inferior rectal artery arising from the proximal anterior division. The microcatheter and wire were then used to select the inferior rectal artery proximally. Angiogram was performed which demonstrated no evidence of  active extravasation. A Waltman loop was formed and the C2 catheter was used to select the right internal iliac artery. Angiogram of the right internal iliac artery demonstrated patent anterior posterior division within inferior rectal artery arising from the proximal aspect of the anterior division. The microcatheter and wire were used to select the inferior rectal artery. Angiogram demonstrated a possible focus of extravasation arising from a more superior branch of the distal inferior rectal artery. The distal branch was selected with the microcatheter. Additional angiogram was performed which demonstrated no evidence of active extravasation  spilling into the rectum, however there was hyperemia and irregularity of the vessels in this region. Therefore, Gel-Foam embolization was performed using approximately 1 mL of Gel-Foam slurry. The catheter was retracted slightly and additional angiogram was performed which demonstrated complete embolization of the distal inferior rectal artery and its branches. No persistent hyperemia was observed. The microcatheter was removed. The C2 catheter was advanced to un formed a Waltman loop and removed. A 6 French Angio-Seal was then placed and deployed without complication or change in pulses. The patient tolerated the procedure well and remained stable throughout the procedure. The patient was transferred to the floor in stable condition. IMPRESSION: 1. Possible focal extravasation versus hyperemia about a distal branch of the right inferior rectal artery. 2. Successful Gel-Foam slurry embolization of the distal right inferior rectal artery. Ruthann Cancer, MD [facility] Radiology Electronically Signed   By: Ruthann Cancer M.D.   On: 11/02/2021 16:47   IR US Guide Vasc Access Right  Result Date: 11/02/2021 INDICATION: 65 year old male status post recent transrectal prostate biopsy presenting with hematochezia, anemia, and CT  arteriogram evidence of acute hemorrhage from the rectum. EXAM: 1. Ultrasound-guided vascular access of the right common femoral artery. 2. Abdominal aortogram. 3. Selective catheterization and angiography of the inferior mesenteric artery, superior rectal artery, bilateral internal iliac arteries, and bilateral inferior rectal arteries 4. Gel-Foam embolization of the distal right inferior rectal artery MEDICATIONS: None. ANESTHESIA/SEDATION: Moderate (conscious) sedation was employed during this procedure. A total of Versed 0 mg and Fentanyl 0 mcg was administered intravenously. Moderate Sedation Time: 0 minutes. The patient's level of consciousness and vital signs were monitored continuously by radiology nursing throughout the procedure under my direct supervision. CONTRAST:  53mL OMNIPAQUE IOHEXOL 300 MG/ML SOLN, 57mL OMNIPAQUE IOHEXOL 300 MG/ML SOLN, 36mL OMNIPAQUE IOHEXOL 300 MG/ML SOLN FLUOROSCOPY TIME:  Fluoroscopy Time: 31 minutes 12 seconds (2,778 mGy). COMPLICATIONS: None immediate. PROCEDURE: Informed consent was obtained from the patient following explanation of the procedure, risks, benefits and alternatives. The patient understands, agrees and consents for the procedure. All questions were addressed. A time out was performed prior to the initiation of the procedure. Maximal barrier sterile technique utilized including caps, mask, sterile gowns, sterile gloves, large sterile drape, hand hygiene, and Betadine prep. The right groin was prepped and draped in standard fashion. Preprocedure ultrasound evaluation demonstrated patency of the right common femoral artery. A permanent image was captured and stored in the record. Subdermal Local anesthesia was provided at the planned needle entry site with 1% lidocaine. A small skin nick was made. Under direct ultrasound visualization, the right common femoral artery was accessed with standard micropuncture technique. A limited right lower extremity angiogram was  performed which demonstrated adequate puncture site for closure device use. A J wire was directed to the abdominal aorta and exchanged for a 5 French, 11 cm vascular sheath. A RIM catheter was introduced and was attempted to be used to engage the IMA however was unsuccessful. Abdominal aortogram was then performed in a steep obliquity with a pigtail flush catheter to demonstrate the ostium of the IMA. The pigtail catheter was then exchanged for a Mickelson catheter which successfully engaged the IMA ostium. A Cook cantata microcatheter and a synchro soft microwire were then inserted into the proximal IMA. IMA angiogram was then performed which demonstrated patency of the superior rectal artery and its branches. There is mild hyperemia in the rectum without definite evidence of active extravasation. The microcatheter was removed and the  Mickelson catheter was redirected to select the left common iliac artery. The catheter was exchanged over a Glidewire for a C2 catheter. The C2 catheter was uses likely internal iliac artery. Internal iliac angiogram was performed which demonstrated patent anterior and posterior proximal branches and in inferior rectal artery arising from the proximal anterior division. The microcatheter and wire were then used to select the inferior rectal artery proximally. Angiogram was performed which demonstrated no evidence of active extravasation. A Waltman loop was formed and the C2 catheter was used to select the right internal iliac artery. Angiogram of the right internal iliac artery demonstrated patent anterior posterior division within inferior rectal artery arising from the proximal aspect of the anterior division. The microcatheter and wire were used to select the inferior rectal artery. Angiogram demonstrated a possible focus of extravasation arising from a more superior branch of the distal inferior rectal artery. The distal branch was selected with the microcatheter. Additional  angiogram was performed which demonstrated no evidence of active extravasation spilling into the rectum, however there was hyperemia and irregularity of the vessels in this region. Therefore, Gel-Foam embolization was performed using approximately 1 mL of Gel-Foam slurry. The catheter was retracted slightly and additional angiogram was performed which demonstrated complete embolization of the distal inferior rectal artery and its branches. No persistent hyperemia was observed. The microcatheter was removed. The C2 catheter was advanced to un formed a Waltman loop and removed. A 6 French Angio-Seal was then placed and deployed without complication or change in pulses. The patient tolerated the procedure well and remained stable throughout the procedure. The patient was transferred to the floor in stable condition. IMPRESSION: 1. Possible focal extravasation versus hyperemia about a distal branch of the right inferior rectal artery. 2. Successful Gel-Foam slurry embolization of the distal right inferior rectal artery. Ruthann Cancer, MD [facility] Radiology Electronically Signed   By: Ruthann Cancer M.D.   On: 11/02/2021 16:47   ECHOCARDIOGRAM COMPLETE  Result Date: 11/03/2021    ECHOCARDIOGRAM REPORT   Patient Name:   [name] Date of Exam: 11/03/2021 Medical Rec #:  [id number]       Height:       74.0 in Accession #:    [id number]      Weight:       250.0 lb Date of Birth:  1956-09-05       BSA:          2.390 m Patient Age:    40 years        BP:           118/73 mmHg Patient Gender: M               HR:           82 bpm. Exam Location:  Inpatient Procedure: 2D Echo, Color Doppler and Cardiac Doppler Indications:    Chest pain  History:        Patient has prior history of Echocardiogram examinations. Risk                 Factors:Hypertension, Diabetes and Sleep Apnea.  Sonographer:    Jyl Heinz Referring Phys: [id number] [place]  1. Left  ventricular ejection fraction, by estimation, is 65 to 70%. The left ventricle has normal function. The left ventricle has no regional wall motion abnormalities. There is mild concentric left ventricular hypertrophy. Left ventricular diastolic parameters are consistent with Grade I diastolic dysfunction (impaired relaxation).  2. Right ventricular systolic function is normal. The right ventricular size is normal. Tricuspid regurgitation signal is inadequate for assessing PA pressure.  3. The mitral valve is grossly normal. No evidence of mitral valve regurgitation. No evidence of mitral stenosis.  4. The aortic valve is tricuspid. Aortic valve regurgitation is not visualized. No aortic stenosis is present.  5. Aortic dilatation noted. There is mild dilatation of the aortic root, measuring 40 mm. Conclusion(s)/Recommendation(s): Normal biventricular function without evidence of hemodynamically significant valvular heart disease. FINDINGS  Left Ventricle: Left ventricular ejection fraction, by estimation, is 65 to 70%. The left ventricle has normal function. The left ventricle has no regional wall motion abnormalities. The left ventricular internal cavity size was normal in size. There is  mild concentric left ventricular hypertrophy. Left ventricular diastolic parameters are consistent with Grade I diastolic dysfunction (impaired relaxation). Right Ventricle: The right ventricular size is normal. No increase in right ventricular wall thickness. Right ventricular systolic function is normal. Tricuspid regurgitation signal is inadequate for assessing PA pressure. Left Atrium: Left atrial size was normal in size. Right Atrium: Right atrial size was normal in size. Pericardium: There is no evidence of pericardial effusion. Mitral Valve: The mitral valve is grossly normal. Mild mitral annular calcification. No evidence of mitral valve regurgitation. No evidence of mitral valve stenosis. Tricuspid Valve: The tricuspid  valve is grossly normal. Tricuspid valve regurgitation is trivial. No evidence of tricuspid stenosis. Aortic Valve: The aortic valve is tricuspid. Aortic valve regurgitation is not visualized. No aortic stenosis is present. Aortic valve mean gradient measures 10.0 mmHg. Aortic valve peak gradient measures 17.5 mmHg. Aortic valve area, by VTI measures 4.49 cm. Pulmonic Valve: The pulmonic valve was grossly normal. Pulmonic valve regurgitation is not visualized. No evidence of pulmonic stenosis. Aorta: Aortic dilatation noted. There is mild dilatation of the aortic root, measuring 40 mm. Venous: The inferior vena cava was not well visualized. IAS/Shunts: The atrial septum is grossly normal.  LEFT VENTRICLE PLAX 2D LVIDd:         4.10 cm     Diastology LVIDs:         2.50 cm     LV e' medial:    8.55 cm/s LV PW:         1.30 cm     LV E/e' medial:  10.2 LV IVS:        1.30 cm     LV e' lateral:   8.38 cm/s LVOT diam:     2.40 cm     LV E/e' lateral: 10.4 LV SV:         159 LV SV Index:   67 LVOT Area:     4.52 cm  LV Volumes (MOD) LV vol d, MOD A2C: 97.2 ml LV vol d, MOD A4C: 92.8 ml LV vol s, MOD A2C: 32.5 ml LV vol s, MOD A4C: 33.3 ml LV SV MOD A2C:     64.7 ml LV SV MOD A4C:     92.8 ml LV SV MOD BP:      63.0 ml RIGHT VENTRICLE RV Basal diam:  2.90 cm RV Mid diam:    2.60 cm RV S prime:     12.60 cm/s TAPSE (M-mode): 2.5 cm LEFT ATRIUM             Index        RIGHT ATRIUM           Index LA diam:  3.20 cm 1.34 cm/m   RA Area:     18.70 cm LA Vol (A2C):   47.4 ml 19.84 ml/m  RA Volume:   54.50 ml  22.81 ml/m LA Vol (A4C):   36.8 ml 15.40 ml/m LA Biplane Vol: 43.5 ml 18.20 ml/m  AORTIC VALVE AV Area (Vmax):    3.96 cm AV Area (Vmean):   4.21 cm AV Area (VTI):     4.49 cm AV Vmax:           209.00 cm/s AV Vmean:          152.500 cm/s AV VTI:            0.355 m AV Peak Grad:      17.5 mmHg AV Mean Grad:      10.0 mmHg LVOT Vmax:         183.00 cm/s LVOT Vmean:        142.000 cm/s LVOT VTI:           0.352 m LVOT/AV VTI ratio: 0.99  AORTA Ao Root diam: 4.00 cm Ao Asc diam:  3.40 cm MITRAL VALVE MV Area (PHT): 3.36 cm     SHUNTS MV Decel Time: 226 msec     Systemic VTI:  0.35 m MV E velocity: 86.90 cm/s   Systemic Diam: 2.40 cm MV A velocity: 128.00 cm/s MV E/A ratio:  0.68 Eleonore Chiquito MD Electronically signed by Eleonore Chiquito MD Signature Date/Time: 11/03/2021/3:38:33 PM    Final    IR EMBO ART  VEN HEMORR LYMPH EXTRAV  INC GUIDE ROADMAPPING  Result Date: 11/02/2021 INDICATION: 65 year old male status post recent transrectal prostate biopsy presenting with hematochezia, anemia, and CT arteriogram evidence of acute hemorrhage from the rectum. EXAM: 1. Ultrasound-guided vascular access of the right common femoral artery. 2. Abdominal aortogram. 3. Selective catheterization and angiography of the inferior mesenteric artery, superior rectal artery, bilateral internal iliac arteries, and bilateral inferior rectal arteries 4. Gel-Foam embolization of the distal right inferior rectal artery MEDICATIONS: None. ANESTHESIA/SEDATION: Moderate (conscious) sedation was employed during this procedure. A total of Versed 0 mg and Fentanyl 0 mcg was administered intravenously. Moderate Sedation Time: 0 minutes. The patient's level of consciousness and vital signs were monitored continuously by radiology nursing throughout the procedure under my direct supervision. CONTRAST:  47mL OMNIPAQUE IOHEXOL 300 MG/ML SOLN, 75mL OMNIPAQUE IOHEXOL 300 MG/ML SOLN, 37mL OMNIPAQUE IOHEXOL 300 MG/ML SOLN FLUOROSCOPY TIME:  Fluoroscopy Time: 31 minutes 12 seconds (2,778 mGy). COMPLICATIONS: None immediate. PROCEDURE: Informed consent was obtained from the patient following explanation of the procedure, risks, benefits and alternatives. The patient understands, agrees and consents for the procedure. All questions were addressed. A time out was performed prior to the initiation of the procedure. Maximal barrier sterile technique utilized  including caps, mask, sterile gowns, sterile gloves, large sterile drape, hand hygiene, and Betadine prep. The right groin was prepped and draped in standard fashion. Preprocedure ultrasound evaluation demonstrated patency of the right common femoral artery. A permanent image was captured and stored in the record. Subdermal Local anesthesia was provided at the planned needle entry site with 1% lidocaine. A small skin nick was made. Under direct ultrasound visualization, the right common femoral artery was accessed with standard micropuncture technique. A limited right lower extremity angiogram was performed which demonstrated adequate puncture site for closure device use. A J wire was directed to the abdominal aorta and exchanged for a 5 French, 11 cm vascular sheath. A RIM catheter was introduced and was  attempted to be used to engage the IMA however was unsuccessful. Abdominal aortogram was then performed in a steep obliquity with a pigtail flush catheter to demonstrate the ostium of the IMA. The pigtail catheter was then exchanged for a Mickelson catheter which successfully engaged the IMA ostium. A Cook cantata microcatheter and a synchro soft microwire were then inserted into the proximal IMA. IMA angiogram was then performed which demonstrated patency of the superior rectal artery and its branches. There is mild hyperemia in the rectum without definite evidence of active extravasation. The microcatheter was removed and the Mickelson catheter was redirected to select the left common iliac artery. The catheter was exchanged over a Glidewire for a C2 catheter. The C2 catheter was uses likely internal iliac artery. Internal iliac angiogram was performed which demonstrated patent anterior and posterior proximal branches and in inferior rectal artery arising from the proximal anterior division. The microcatheter and wire were then used to select the inferior rectal artery proximally. Angiogram was performed which  demonstrated no evidence of active extravasation. A Waltman loop was formed and the C2 catheter was used to select the right internal iliac artery. Angiogram of the right internal iliac artery demonstrated patent anterior posterior division within inferior rectal artery arising from the proximal aspect of the anterior division. The microcatheter and wire were used to select the inferior rectal artery. Angiogram demonstrated a possible focus of extravasation arising from a more superior branch of the distal inferior rectal artery. The distal branch was selected with the microcatheter. Additional angiogram was performed which demonstrated no evidence of active extravasation spilling into the rectum, however there was hyperemia and irregularity of the vessels in this region. Therefore, Gel-Foam embolization was performed using approximately 1 mL of Gel-Foam slurry. The catheter was retracted slightly and additional angiogram was performed which demonstrated complete embolization of the distal inferior rectal artery and its branches. No persistent hyperemia was observed. The microcatheter was removed. The C2 catheter was advanced to un formed a Waltman loop and removed. A 6 French Angio-Seal was then placed and deployed without complication or change in pulses. The patient tolerated the procedure well and remained stable throughout the procedure. The patient was transferred to the floor in stable condition. IMPRESSION: 1. Possible focal extravasation versus hyperemia about a distal branch of the right inferior rectal artery. 2. Successful Gel-Foam slurry embolization of the distal right inferior rectal artery. Ruthann Cancer, MD [facility] Radiology Electronically Signed   By: Ruthann Cancer M.D.   On: 11/02/2021 16:47   CT Angio Abd/Pel w/ and/or w/o  Result Date: 11/02/2021 CLINICAL DATA:  Acute lower GI bleeding, status post prostate biopsy last week. EXAM: CT  ANGIOGRAPHY ABDOMEN AND PELVIS WITH CONTRAST AND WITHOUT CONTRAST TECHNIQUE: Multidetector CT imaging of the abdomen and pelvis was performed using the standard protocol during bolus administration of intravenous contrast. Multiplanar reconstructed images and MIPs were obtained and reviewed to evaluate the vascular anatomy. CONTRAST:  131mL OMNIPAQUE IOHEXOL 350 MG/ML SOLN COMPARISON:  10/08/2020 FINDINGS: VASCULAR Aorta: Minor aortic atherosclerosis. Negative for aneurysm, dissection or occlusive process. No retroperitoneal hemorrhage or hematoma. Celiac: Minor atherosclerotic origin but remains patent including its branches SMA: Minor atherosclerotic origin but remains patent including its branches Renals: Main renal arteries remain patent. Minor atherosclerotic changes. No accessory renal artery. IMA: Remains patent off the distal aorta anteriorly including its branches. Lower rectum active contrast extravasation on the arterial phase which progresses on the delayed portal venous phase imaging compatible with  acute active lower rectal GI bleeding. Inflow: Iliac atherosclerosis noted but no inflow disease or occlusion. Mild iliac tortuosity. Common, internal and external iliac arteries all remain patent. Proximal Outflow: Common femoral, proximal profunda femoral, proximal superficial femoral arteries all remain patent. Veins: No veno-occlusive process. Review of the MIP images confirms the above findings. NON-VASCULAR Lower chest: No acute abnormality. Hepatobiliary: No focal hepatic abnormality. Small left hepatic lobe noted. No biliary obstruction pattern dilatation. Patent portal and hepatic veins. Small gallstones noted. Common bile duct nondilated. Pancreas: Unremarkable. No pancreatic ductal dilatation or surrounding inflammatory changes. Spleen: Normal in size without focal abnormality. Adrenals/Urinary Tract: Adrenal glands are unremarkable. Kidneys are normal, without renal calculi, focal lesion, or  hydronephrosis. Bladder is unremarkable. Stomach/Bowel: Postop changes from gastric bypass. Negative for bowel obstruction, significant dilatation, ileus, or free air. Appendix not visualized. No free fluid, fluid collection, abscess or ascites. Lymphatic: No bulky adenopathy. Reproductive: No other significant finding by CT. Other: No abdominal wall hernia or abnormality. No abdominopelvic ascites. Musculoskeletal: Degenerative changes throughout the spine. Multilevel lower lumbar facet arthropathy. No acute osseous finding. IMPRESSION: VASCULAR Positive CTA for acute lower rectal GI bleeding. These results were called by telephone at the time of interpretation on 11/02/2021 at 11:16 am to provider AMY ESTERWOOD , who verbally acknowledged these results. NON-VASCULAR Remote gastric bypass surgery Incidental cholelithiasis Electronically Signed   By: Jerilynn Mages.  Shick M.D.   On: 11/02/2021 11:21       Assessment / Plan:    #50 65 year old male admitted 2 days ago with acute lower GI hemorrhage, major felt to be at the site of recent transrectal prostate biopsy which was done on 10/26/2021. Patient has had episode of rebleeding, continued oozing since last night, and hemoglobin down to 4.9  He has just undergone repeat mesenteric and pelvic angiography with particle embolization of the superior rectal artery and [place] embolization of the anterior division of the right internal iliac artery.  He has remained hemodynamically stable  #2 new diagnosis of early stage prostate cancer #3 profound anemia secondary to above-unwilling to receive blood transfusions/[employer] He has been seen by hematology and is receiving IV iron. Will notify Dr. Burr Medico of drop in hemoglobin-consider Epogen, though with a relative contraindication with his prostate cancer.   Principal Problem:   GIB (gastrointestinal bleeding) Active Problems:   Hyperlipidemia   Depression   Essential hypertension   Pulmonary embolism  (HCC)   Gross hematuria   Acute blood loss anemia   Esophageal reflux   History of pulmonary embolus (PE)   Diabetes mellitus type 2 in obese ([place])   Chest pain   Hematochezia    LOS: 1 day   Amy Esterwood PA-C 11/04/2021, 8:54 AM  GI ATTENDING  Interval history, events, interventions, data reviewed.  Agree with interval progress note.  No active bleeding at the time of second intervention from IR.  Additional empiric embolization.  Problems exacerbated by inability to get blood products.  Continue to monitor closely.  Docia Chuck. Geri Seminole., M.D. [facility] Division of Gastroenterology

## 2021-11-04 NOTE — Progress Notes (Signed)
Stopped by and saw the patient today it appears that the bleeding is stopped.  It is unclear how or why this all materialized.  His biopsy was 6 days prior to his initial bleeding event.  His INR was never fully therapeutic on Coumadin.  We discussed the risk associated with epogen and progressive prostate cancer.  I do not think that the risk outweighs the benefit of improving his hemoglobin over time.  As such, I recommended that this be started.  Appreciate everyone's efforts in helping stop his rectal bleeding following his prostate biopsy.

## 2021-11-04 NOTE — Progress Notes (Signed)
RN contacted Dr. Karleen Hampshire per pt request for pain medications that last longer than IV Morphine as this has been a very temporary relief for him according to him and his wife. Wife at the bedside also requests Epogen injections that were recommended by Dr. Burr Medico which was also brought to Dr. Petra Kuba attention. This RN will await MD orders to be placed in the chart.

## 2021-11-04 NOTE — Consult Note (Signed)
[name] [birth year]  [id number].    Requesting MD: Dr. Wilfrid Lund Chief Complaint/Reason for Consult: LGI bleed s/p prostate biopsy  HPI:  This is a 65 yo male [place] witness, DM, CAD, on coumadin for DVT/PE 28-Feb-2006) who underwent a prostate biopsy for elevated PSA several days ago by Dr. Louis Meckel.  He presented to the ED with a large amount of BRBPR with some hematuria as well as some lightheadedness.  History mostly obtained from the chart as when I saw him he was prepped for his IR procedure.  He has not been restarted on his Coumadin but was on a Lovenox bridge at home.  Initial management was pelvic angiogram with gelfoam embolization by IR on 11/28 of the right inferior rectal artery.  Unfortunately the patient continued to bleed.  His last hgb was 4.9.  he underwent a colonoscopy last night with no definitive bleeding source identified.  IR has been reconsulted as well as [place] for further recommendations.  Urology did see him yesterday, but not sure if they have been made aware of persistent bleeding issues.  ROS: ROS: Please see HPI, otherwise limited ROS otherwise negative.  Family History  Problem Relation Age of Onset   Hypertension Mother    Prostate cancer Father    Diabetes Sister        died with DM Feb 28, 2001, 2 more sisters with diabetes   Heart failure Sister    Diabetes Brother    Diabetes Sister    Diabetes Sister    Colon cancer Neg Hx    Colon polyps Neg Hx    Esophageal cancer Neg Hx    Rectal cancer Neg Hx    Stomach cancer Neg Hx     Past Medical History:  Diagnosis Date   Arthritis    BACK PAIN    CAD (coronary artery disease) 06/28/2017   CAD in native artery    a. moderate by cardiac CT 2015/03/01.   Chronic bilateral low back pain with bilateral sciatica 01/12/2019   Degenerative arthritis of right knee 07/15/2016   Injected in 07/15/2016 Orthovisc injection started 10/07/2016 DepoMedrol 40 inj on 04/11/17, 25 cc aspirated Orthovisc injection in  05/30/2017     DEGENERATIVE JOINT DISEASE, KNEES, BILATERAL    Depression    Diabetes ([place]) 10/01/2014   Diabetes mellitus    type II   DIVERTICULOSIS, COLON    DVT, lower extremity ([place])    right   History of kidney stones    Hyperlipidemia    Hypertension    HYPOGONADISM, MALE    HYPOTENSION, ORTHOSTATIC    Kidney stones    Long term (current) use of anticoagulants 08/17/2017   Long term current use of anticoagulant    Lumbosacral radiculopathy at S1 06/14/2012   Maxillary sinus cyst 12/11/2018   Obesity    OBESITY, MORBID 03/04/2008   Qualifier: Diagnosis of  By: Jenny Reichmann MD, [place] Oris    OSA (obstructive sleep apnea) 09/11/2013   Pulmonary emboli (HCC)    Pulmonary embolism ([place]) 02/05/2011   RBBB 07/28/2017   Renal insufficiency    a. prior h/o, does not appear chronic.   Superficial phlebitis of arm 03/18/2016   Vitamin D deficiency 07/09/2020    Past Surgical History:  Procedure Laterality Date   ankle surgury  2001 bilateral   with bone spurs and torn tendon   COLONOSCOPY  03/18/2010   EDG  01-Mar-2007   chronic Duodenitis   GASTRIC BYPASS  HEEL SPUR SURGERY Bilateral 2005   IR ANGIOGRAM PELVIS SELECTIVE OR SUPRASELECTIVE  11/02/2021   IR ANGIOGRAM SELECTIVE EACH ADDITIONAL VESSEL  11/02/2021   IR ANGIOGRAM SELECTIVE EACH ADDITIONAL VESSEL  11/02/2021   IR ANGIOGRAM SELECTIVE EACH ADDITIONAL VESSEL  11/02/2021   IR ANGIOGRAM VISCERAL SELECTIVE  11/02/2021   IR EMBO ART  VEN HEMORR LYMPH EXTRAV  INC GUIDE ROADMAPPING  11/02/2021   IR US GUIDE VASC ACCESS RIGHT  11/02/2021   knee surgury  2000 & 2003    cartilage damage   LEFT HEART CATH AND CORONARY ANGIOGRAPHY N/A 02/16/2021   Procedure: LEFT HEART CATH AND CORONARY ANGIOGRAPHY;  Surgeon: Sherren Mocha, MD;  Location: [facility];  Service: Cardiovascular;  Laterality: N/A;   LUMBAR LAMINECTOMY/DECOMPRESSION MICRODISCECTOMY  09/28/2012   Procedure: LUMBAR LAMINECTOMY/DECOMPRESSION MICRODISCECTOMY 2 LEVELS;  Surgeon:  [name], MD;  Location: [facility];  Service: Orthopedics;  Laterality: Left;  Decompressive laminectomy L4-L5. Microdiscectomy L5-S1   Skin removal surgery     extra abdominal skin removed from his weight loss   TONSILLECTOMY AND ADENOIDECTOMY  age 84 or 6   TOTAL KNEE ARTHROPLASTY  01/10/2012   Procedure: TOTAL KNEE ARTHROPLASTY;  Surgeon: [name], MD;  Location: [facility];  Service: Orthopedics;  Laterality: Left;   TOTAL KNEE ARTHROPLASTY Right 11/18/2020   Procedure: TOTAL KNEE ARTHROPLASTY;  Surgeon: Paralee Cancel, MD;  Location: [facility];  Service: Orthopedics;  Laterality: Right;  70 mins   UPPER GASTROINTESTINAL ENDOSCOPY  03/18/2010    Social History:  reports that he quit smoking about 42 years ago. His smoking use included cigarettes. He has a 1.25 pack-year smoking history. He has never used smokeless tobacco. He reports current alcohol use of about 2.0 standard drinks per week. He reports that he does not use drugs.  Allergies:  Allergies  Allergen Reactions   Adhesive [Tape] Other (See Comments)    Painful, Please use "paper" tape   Other     NO BLOOD -JEHOVAH'S WITNESS-SIGNED REFUSAL BUT WOULD TAKE ALBUMIN    Medications Prior to Admission  Medication Sig Dispense Refill   Artificial Tear Ointment (ARTIFICIAL TEARS) ointment Place 1 drop into both eyes every 4 (four) hours as needed (dry eyes).      atorvastatin (LIPITOR) 80 MG tablet Take 1 tablet (80 mg total) by mouth daily. 90 tablet 2   chlorthalidone (HYGROTON) 25 MG tablet TAKE 1 TABLET BY MOUTH EVERY DAY (Patient taking differently: Take 25 mg by mouth daily.) 90 tablet 3   Cholecalciferol (VITAMIN D3) 125 MCG (5000 UT) CAPS Take 5,000 Units by mouth daily.      Cyanocobalamin (VITAMIN B-12) 5000 MCG SUBL Place 5,000 mcg under the tongue 3 (three) times a week.     enoxaparin (LOVENOX) 120 MG/0.8ML injection Inject 0.8 mLs (120 mg total) into the skin every 12 (twelve) hours. As directed by anticoagulation  clinic. 15.2 mL 0   escitalopram (LEXAPRO) 10 MG tablet TAKE 1 TABLET BY MOUTH EVERY DAY (Patient taking differently: Take 10 mg by mouth daily.) 90 tablet 2   gabapentin (NEURONTIN) 100 MG capsule Take 2 capsules (200 mg total) by mouth at bedtime. 60 capsule 0   glipiZIDE (GLUCOTROL XL) 2.5 MG 24 hr tablet TAKE 1 TABLET (2.5 MG TOTAL) BY MOUTH DAILY WITH BREAKFAST. 90 tablet 3   metFORMIN (GLUCOPHAGE) 500 MG tablet TAKE 4 TABLETS BY MOUTH DAILY WITH BREAKFAST (Patient taking differently: Take 2,000 mg by mouth daily with breakfast.) 360 tablet  3   Multiple Vitamin (MULTIVITAMIN WITH MINERALS) TABS tablet Take 1 tablet by mouth daily at 12 noon.     nitroGLYCERIN (NITROSTAT) 0.4 MG SL tablet Place 1 tablet (0.4 mg total) under the tongue every 5 (five) minutes as needed for chest pain. 90 tablet 3   pantoprazole (PROTONIX) 40 MG tablet TAKE 1 TABLET BY MOUTH EVERY DAY (Patient taking differently: 40 mg daily.) 90 tablet 3   warfarin (COUMADIN) 10 MG tablet Take 1 tablet daily or as directed by anticoagulation clinic 105 tablet 1   fluticasone (FLONASE) 50 MCG/ACT nasal spray Place 2 sprays into both nostrils daily as needed for allergies. (Patient not taking: Reported on 11/02/2021)       Physical Exam: Blood pressure 114/67, pulse 72, temperature 98.7 F (37.1 C), temperature source Oral, resp. rate (!) 21, height 6\' 2"  (1.88 m), weight 122 kg, SpO2 99 %. General: pleasant, WD, WN black male who is laying in bed in NAD HEENT: head is normocephalic, atraumatic.  Sclera are noninjected.  PERRL.  Ears and nose without any masses or lesions.  Mouth is pale pink and dry Rectal: normal rectal tone.  No obvious hemorrhoids or masses.  Rectal Gel-foam cylinder was placed in rectum without difficulty.  Blood was present on glove from this, but more dark maroon as opposed to BRB Skin: warm and dry with no masses, lesions, or rashes Psych: A&Ox3 with an appropriate affect.   Results for orders placed  or performed during the hospital encounter of 11/02/21 (from the past 48 hour(s))  HIV Antibody (routine testing w rflx)     Status: None   Collection Time: 11/02/21  9:34 AM  Result Value Ref Range   HIV Screen 4th Generation wRfx Non Reactive Non Reactive    Comment: Performed at [facility], [address] [place], [place] [address]  Iron and TIBC     Status: None   Collection Time: 11/02/21  9:34 AM  Result Value Ref Range   Iron 102 45 - 182 ug/dL   TIBC 306 250 - 450 ug/dL   Saturation Ratios 33 17.9 - 39.5 %   UIBC 204 ug/dL    Comment: Performed at [facility], [place] [address] [place], [place] [address]  Ferritin     Status: Abnormal   Collection Time: 11/02/21  9:34 AM  Result Value Ref Range   Ferritin 19 (L) 24 - 336 ng/mL    Comment: Performed at [facility], [place] [address] [place], [place] [address]  Glucose, capillary     Status: Abnormal   Collection Time: 11/02/21 12:02 PM  Result Value Ref Range   Glucose-Capillary 197 (H) 70 - 99 mg/dL    Comment: Glucose reference range applies only to samples taken after fasting for at least 8 hours.   Comment 1 Notify RN   Hemoglobin and hematocrit, blood     Status: Abnormal   Collection Time: 11/02/21 12:24 PM  Result Value Ref Range   Hemoglobin 9.5 (L) 13.0 - 17.0 g/dL   HCT 30.9 (L) 39.0 - 52.0 %    Comment: Performed at [facility], [place] [address], [place], [place] [address]  MRSA Next Gen by PCR, Nasal     Status: None   Collection Time: 11/02/21 12:53 PM   Specimen: Nasal Mucosa; Nasal Swab  Result Value Ref Range   MRSA by PCR Next Gen NOT DETECTED NOT DETECTED    Comment: (NOTE) The  GeneXpert MRSA Assay (FDA approved for NASAL specimens only), is one component of a comprehensive MRSA colonization surveillance program. It is not intended to diagnose MRSA infection nor to guide or monitor treatment for MRSA infections. Test performance is not FDA  approved in patients less than 30 years old. Performed at [facility], [place] [address], [place], [place] [address]   Troponin I (High Sensitivity)     Status: None   Collection Time: 11/02/21  1:00 PM  Result Value Ref Range   Troponin I (High Sensitivity) 3 <18 ng/L    Comment: (NOTE) Elevated high sensitivity troponin I (hsTnI) values and significant  changes across serial measurements may suggest ACS but many other  chronic and acute conditions are known to elevate hsTnI results.  Refer to the "Links" section for chest pain algorithms and additional  guidance. Performed at [facility], [place] [address] [place], [place] [address]   Hemoglobin and hematocrit, blood     Status: Abnormal   Collection Time: 11/02/21  5:12 PM  Result Value Ref Range   Hemoglobin 8.0 (L) 13.0 - 17.0 g/dL   HCT 26.0 (L) 39.0 - 52.0 %    Comment: Performed at [facility], [place] [address], [place], [place] [address]  Troponin I (High Sensitivity)     Status: None   Collection Time: 11/02/21  5:12 PM  Result Value Ref Range   Troponin I (High Sensitivity) 4 <18 ng/L    Comment: (NOTE) Elevated high sensitivity troponin I (hsTnI) values and significant  changes across serial measurements may suggest ACS but many other  chronic and acute conditions are known to elevate hsTnI results.  Refer to the "Links" section for chest pain algorithms and additional  guidance. Performed at [facility], [place] [address] [place], [place] [address]   Glucose, capillary     Status: Abnormal   Collection Time: 11/02/21  9:50 PM  Result Value Ref Range   Glucose-Capillary 207 (H) 70 - 99 mg/dL    Comment: Glucose reference range applies only to samples taken after fasting for at least 8 hours.   Comment 1 Notify RN    Comment 2 Document in Chart   Hemoglobin and hematocrit, blood     Status: Abnormal   Collection Time: 11/02/21 10:15 PM   Result Value Ref Range   Hemoglobin 7.5 (L) 13.0 - 17.0 g/dL   HCT 24.0 (L) 39.0 - 52.0 %    Comment: Performed at [facility], [place] [address] [place], [place] [address]  Comprehensive metabolic panel     Status: Abnormal   Collection Time: 11/03/21  4:55 AM  Result Value Ref Range   Sodium 134 (L) 135 - 145 mmol/L   Potassium 4.5 3.5 - 5.1 mmol/L   Chloride 107 98 - 111 mmol/L   CO2 21 (L) 22 - 32 mmol/L   Glucose, Bld 168 (H) 70 - 99 mg/dL    Comment: Glucose reference range applies only to samples taken after fasting for at least 8 hours.   BUN 36 (H) 8 - 23 mg/dL   Creatinine, Ser 1.67 (H) 0.61 - 1.24 mg/dL   Calcium 8.1 (L) 8.9 - 10.3 mg/dL   Total Protein 4.9 (L) 6.5 - 8.1 g/dL   Albumin 3.1 (L) 3.5 - 5.0 g/dL   AST 28 15 - 41 U/L   ALT 27 0 - 44 U/L   Alkaline Phosphatase 37 (L) 38 - 126 U/L  Total Bilirubin 0.5 0.3 - 1.2 mg/dL   GFR, Estimated 45 (L) >60 mL/min    Comment: (NOTE) Calculated using the CKD-EPI Creatinine Equation (2021)    Anion gap 6 5 - 15    Comment: Performed at [facility], [place] [address], [place], [place] [address]  CBC     Status: Abnormal   Collection Time: 11/03/21  4:55 AM  Result Value Ref Range   WBC 7.8 4.0 - 10.5 K/uL   RBC 3.03 (L) 4.22 - 5.81 MIL/uL   Hemoglobin 6.8 (LL) 13.0 - 17.0 g/dL    Comment: Reticulocyte Hemoglobin testing may be clinically indicated, consider ordering this additional test [id number] THIS CRITICAL RESULT HAS VERIFIED AND BEEN CALLED TO MELISSA RN BY SARA VANHOORNE ON 11 29 2022 AT 0848, AND HAS BEEN READ BACK.     HCT 22.2 (L) 39.0 - 52.0 %   MCV 73.3 (L) 80.0 - 100.0 fL   MCH 22.4 (L) 26.0 - 34.0 pg   MCHC 30.6 30.0 - 36.0 g/dL   RDW 16.6 (H) 11.5 - 15.5 %   Platelets 146 (L) 150 - 400 K/uL   nRBC 0.0 0.0 - 0.2 %    Comment: Performed at [facility], [place] [address] [place], [place] [address]  Sodium, urine, random     Status: None    Collection Time: 11/03/21 11:19 AM  Result Value Ref Range   Sodium, Ur 67 mmol/L    Comment: Performed at [facility], [place] [address], [place], [place] [address]  Creatinine, urine, random     Status: None   Collection Time: 11/03/21 11:19 AM  Result Value Ref Range   Creatinine, Urine 119.64 mg/dL    Comment: Performed at [facility], [place] [address] [place], [place] [address]  Hemoglobin and hematocrit, blood     Status: Abnormal   Collection Time: 11/03/21 11:42 AM  Result Value Ref Range   Hemoglobin 6.0 (LL) 13.0 - 17.0 g/dL    Comment: CRITICAL VALUE NOTED.  VALUE IS CONSISTENT WITH PREVIOUSLY REPORTED AND CALLED VALUE.   HCT 19.5 (L) 39.0 - 52.0 %    Comment: Performed at [facility], [place] [address], [place], [place] [address]  Glucose, capillary     Status: Abnormal   Collection Time: 11/03/21 12:05 PM  Result Value Ref Range   Glucose-Capillary 153 (H) 70 - 99 mg/dL    Comment: Glucose reference range applies only to samples taken after fasting for at least 8 hours.   Comment 1 Notify RN   Hemoglobin and hematocrit, blood     Status: Abnormal   Collection Time: 11/03/21  5:37 PM  Result Value Ref Range   Hemoglobin 6.6 (LL) 13.0 - 17.0 g/dL   HCT 21.3 (L) 39.0 - 52.0 %    Comment: Performed at [facility], [place] [address] [place], [place] [address]  Glucose, capillary     Status: Abnormal   Collection Time: 11/03/21  6:11 PM  Result Value Ref Range   Glucose-Capillary 189 (H) 70 - 99 mg/dL    Comment: Glucose reference range applies only to samples taken after fasting for at least 8 hours.   Comment 1 Notify RN   Glucose, capillary     Status: Abnormal   Collection Time: 11/03/21  8:52 PM  Result Value Ref Range   Glucose-Capillary 160 (H) 70 - 99 mg/dL    Comment: Glucose reference range applies only  to samples taken after fasting for at least 8 hours.  Hemoglobin and hematocrit, blood      Status: Abnormal   Collection Time: 11/03/21  9:37 PM  Result Value Ref Range   Hemoglobin 5.2 (LL) 13.0 - 17.0 g/dL    Comment: CRITICAL VALUE NOTED.  VALUE IS CONSISTENT WITH PREVIOUSLY REPORTED AND CALLED VALUE. REPEATED TO VERIFY    HCT 16.9 (L) 39.0 - 52.0 %    Comment: Performed at [facility], [place] [address] [place], [place] [address]  Protime-INR     Status: Abnormal   Collection Time: 11/03/21  9:37 PM  Result Value Ref Range   Prothrombin Time 15.6 (H) 11.4 - 15.2 seconds   INR 1.2 0.8 - 1.2    Comment: (NOTE) INR goal varies based on device and disease states. Performed at [facility], [place] [address] [place], [place] [address]   Glucose, capillary     Status: Abnormal   Collection Time: 11/03/21 11:37 PM  Result Value Ref Range   Glucose-Capillary 142 (H) 70 - 99 mg/dL    Comment: Glucose reference range applies only to samples taken after fasting for at least 8 hours.  Glucose, capillary     Status: Abnormal   Collection Time: 11/04/21  4:55 AM  Result Value Ref Range   Glucose-Capillary 117 (H) 70 - 99 mg/dL    Comment: Glucose reference range applies only to samples taken after fasting for at least 8 hours.  Basic metabolic panel     Status: Abnormal   Collection Time: 11/04/21  5:01 AM  Result Value Ref Range   Sodium 135 135 - 145 mmol/L   Potassium 4.0 3.5 - 5.1 mmol/L   Chloride 110 98 - 111 mmol/L   CO2 24 22 - 32 mmol/L   Glucose, Bld 117 (H) 70 - 99 mg/dL    Comment: Glucose reference range applies only to samples taken after fasting for at least 8 hours.   BUN 25 (H) 8 - 23 mg/dL   Creatinine, Ser 1.26 (H) 0.61 - 1.24 mg/dL   Calcium 7.6 (L) 8.9 - 10.3 mg/dL   GFR, Estimated >60 >60 mL/min    Comment: (NOTE) Calculated using the CKD-EPI Creatinine Equation (2021)    Anion gap 1 (L) 5 - 15    Comment: Performed at [facility], [place] [address] [place], [place] [address]  Hemoglobin and  hematocrit, blood     Status: Abnormal   Collection Time: 11/04/21  5:01 AM  Result Value Ref Range   Hemoglobin 4.9 (LL) 13.0 - 17.0 g/dL    Comment: CRITICAL VALUE NOTED.  VALUE IS CONSISTENT WITH PREVIOUSLY REPORTED AND CALLED VALUE. REPEATED TO VERIFY    HCT 15.5 (L) 39.0 - 52.0 %    Comment: Performed at [facility], [place] [address], [place], [place] [address]   IR Angiogram Visceral Selective  Result Date: 11/02/2021 INDICATION: 65 year old male status post recent transrectal prostate biopsy presenting with hematochezia, anemia, and CT arteriogram evidence of acute hemorrhage from the rectum. EXAM: 1. Ultrasound-guided vascular access of the right common femoral artery. 2. Abdominal aortogram. 3. Selective catheterization and angiography of the inferior mesenteric artery, superior rectal artery, bilateral internal iliac arteries, and bilateral inferior rectal arteries 4. Gel-Foam embolization of the distal right inferior rectal artery MEDICATIONS: None. ANESTHESIA/SEDATION: Moderate (conscious) sedation was employed during this procedure. A total of Versed 0 mg and Fentanyl 0 mcg was administered intravenously. Moderate Sedation  Time: 0 minutes. The patient's level of consciousness and vital signs were monitored continuously by radiology nursing throughout the procedure under my direct supervision. CONTRAST:  53mL OMNIPAQUE IOHEXOL 300 MG/ML SOLN, 56mL OMNIPAQUE IOHEXOL 300 MG/ML SOLN, 58mL OMNIPAQUE IOHEXOL 300 MG/ML SOLN FLUOROSCOPY TIME:  Fluoroscopy Time: 31 minutes 12 seconds (2,778 mGy). COMPLICATIONS: None immediate. PROCEDURE: Informed consent was obtained from the patient following explanation of the procedure, risks, benefits and alternatives. The patient understands, agrees and consents for the procedure. All questions were addressed. A time out was performed prior to the initiation of the procedure. Maximal barrier sterile technique utilized including caps, mask,  sterile gowns, sterile gloves, large sterile drape, hand hygiene, and Betadine prep. The right groin was prepped and draped in standard fashion. Preprocedure ultrasound evaluation demonstrated patency of the right common femoral artery. A permanent image was captured and stored in the record. Subdermal Local anesthesia was provided at the planned needle entry site with 1% lidocaine. A small skin nick was made. Under direct ultrasound visualization, the right common femoral artery was accessed with standard micropuncture technique. A limited right lower extremity angiogram was performed which demonstrated adequate puncture site for closure device use. A J wire was directed to the abdominal aorta and exchanged for a 5 French, 11 cm vascular sheath. A RIM catheter was introduced and was attempted to be used to engage the IMA however was unsuccessful. Abdominal aortogram was then performed in a steep obliquity with a pigtail flush catheter to demonstrate the ostium of the IMA. The pigtail catheter was then exchanged for a Mickelson catheter which successfully engaged the IMA ostium. A Cook cantata microcatheter and a synchro soft microwire were then inserted into the proximal IMA. IMA angiogram was then performed which demonstrated patency of the superior rectal artery and its branches. There is mild hyperemia in the rectum without definite evidence of active extravasation. The microcatheter was removed and the Mickelson catheter was redirected to select the left common iliac artery. The catheter was exchanged over a Glidewire for a C2 catheter. The C2 catheter was uses likely internal iliac artery. Internal iliac angiogram was performed which demonstrated patent anterior and posterior proximal branches and in inferior rectal artery arising from the proximal anterior division. The microcatheter and wire were then used to select the inferior rectal artery proximally. Angiogram was performed which demonstrated no evidence  of active extravasation. A Waltman loop was formed and the C2 catheter was used to select the right internal iliac artery. Angiogram of the right internal iliac artery demonstrated patent anterior posterior division within inferior rectal artery arising from the proximal aspect of the anterior division. The microcatheter and wire were used to select the inferior rectal artery. Angiogram demonstrated a possible focus of extravasation arising from a more superior branch of the distal inferior rectal artery. The distal branch was selected with the microcatheter. Additional angiogram was performed which demonstrated no evidence of active extravasation spilling into the rectum, however there was hyperemia and irregularity of the vessels in this region. Therefore, Gel-Foam embolization was performed using approximately 1 mL of Gel-Foam slurry. The catheter was retracted slightly and additional angiogram was performed which demonstrated complete embolization of the distal inferior rectal artery and its branches. No persistent hyperemia was observed. The microcatheter was removed. The C2 catheter was advanced to un formed a Waltman loop and removed. A 6 French Angio-Seal was then placed and deployed without complication or change in pulses. The patient tolerated the procedure well and remained stable throughout  the procedure. The patient was transferred to the floor in stable condition. IMPRESSION: 1. Possible focal extravasation versus hyperemia about a distal branch of the right inferior rectal artery. 2. Successful Gel-Foam slurry embolization of the distal right inferior rectal artery. Ruthann Cancer, MD [facility] Electronically Signed   By: Ruthann Cancer M.D.   On: 11/02/2021 16:47   IR Angiogram Pelvis Selective Or Supraselective  Result Date: 11/02/2021 INDICATION: 65 year old male status post recent transrectal prostate biopsy presenting with  hematochezia, anemia, and CT arteriogram evidence of acute hemorrhage from the rectum. EXAM: 1. Ultrasound-guided vascular access of the right common femoral artery. 2. Abdominal aortogram. 3. Selective catheterization and angiography of the inferior mesenteric artery, superior rectal artery, bilateral internal iliac arteries, and bilateral inferior rectal arteries 4. Gel-Foam embolization of the distal right inferior rectal artery MEDICATIONS: None. ANESTHESIA/SEDATION: Moderate (conscious) sedation was employed during this procedure. A total of Versed 0 mg and Fentanyl 0 mcg was administered intravenously. Moderate Sedation Time: 0 minutes. The patient's level of consciousness and vital signs were monitored continuously by radiology nursing throughout the procedure under my direct supervision. CONTRAST:  18mL OMNIPAQUE IOHEXOL 300 MG/ML SOLN, 15mL OMNIPAQUE IOHEXOL 300 MG/ML SOLN, 15mL OMNIPAQUE IOHEXOL 300 MG/ML SOLN FLUOROSCOPY TIME:  Fluoroscopy Time: 31 minutes 12 seconds (2,778 mGy). COMPLICATIONS: None immediate. PROCEDURE: Informed consent was obtained from the patient following explanation of the procedure, risks, benefits and alternatives. The patient understands, agrees and consents for the procedure. All questions were addressed. A time out was performed prior to the initiation of the procedure. Maximal barrier sterile technique utilized including caps, mask, sterile gowns, sterile gloves, large sterile drape, hand hygiene, and Betadine prep. The right groin was prepped and draped in standard fashion. Preprocedure ultrasound evaluation demonstrated patency of the right common femoral artery. A permanent image was captured and stored in the record. Subdermal Local anesthesia was provided at the planned needle entry site with 1% lidocaine. A small skin nick was made. Under direct ultrasound visualization, the right common femoral artery was accessed with standard micropuncture technique. A limited right  lower extremity angiogram was performed which demonstrated adequate puncture site for closure device use. A J wire was directed to the abdominal aorta and exchanged for a 5 French, 11 cm vascular sheath. A RIM catheter was introduced and was attempted to be used to engage the IMA however was unsuccessful. Abdominal aortogram was then performed in a steep obliquity with a pigtail flush catheter to demonstrate the ostium of the IMA. The pigtail catheter was then exchanged for a Mickelson catheter which successfully engaged the IMA ostium. A Cook cantata microcatheter and a synchro soft microwire were then inserted into the proximal IMA. IMA angiogram was then performed which demonstrated patency of the superior rectal artery and its branches. There is mild hyperemia in the rectum without definite evidence of active extravasation. The microcatheter was removed and the Mickelson catheter was redirected to select the left common iliac artery. The catheter was exchanged over a Glidewire for a C2 catheter. The C2 catheter was uses likely internal iliac artery. Internal iliac angiogram was performed which demonstrated patent anterior and posterior proximal branches and in inferior rectal artery arising from the proximal anterior division. The microcatheter and wire were then used to select the inferior rectal artery proximally. Angiogram was performed which demonstrated no evidence of active extravasation. A Waltman loop was formed and the C2 catheter was used to select the right internal iliac artery. Angiogram  of the right internal iliac artery demonstrated patent anterior posterior division within inferior rectal artery arising from the proximal aspect of the anterior division. The microcatheter and wire were used to select the inferior rectal artery. Angiogram demonstrated a possible focus of extravasation arising from a more superior branch of the distal inferior rectal artery. The distal branch was selected with the  microcatheter. Additional angiogram was performed which demonstrated no evidence of active extravasation spilling into the rectum, however there was hyperemia and irregularity of the vessels in this region. Therefore, Gel-Foam embolization was performed using approximately 1 mL of Gel-Foam slurry. The catheter was retracted slightly and additional angiogram was performed which demonstrated complete embolization of the distal inferior rectal artery and its branches. No persistent hyperemia was observed. The microcatheter was removed. The C2 catheter was advanced to un formed a Waltman loop and removed. A 6 French Angio-Seal was then placed and deployed without complication or change in pulses. The patient tolerated the procedure well and remained stable throughout the procedure. The patient was transferred to the floor in stable condition. IMPRESSION: 1. Possible focal extravasation versus hyperemia about a distal branch of the right inferior rectal artery. 2. Successful Gel-Foam slurry embolization of the distal right inferior rectal artery. Ruthann Cancer, MD [facility] Radiology Electronically Signed   By: Ruthann Cancer M.D.   On: 11/02/2021 16:47   IR Angiogram Selective Each Additional Vessel  Result Date: 11/02/2021 INDICATION: 65 year old male status post recent transrectal prostate biopsy presenting with hematochezia, anemia, and CT arteriogram evidence of acute hemorrhage from the rectum. EXAM: 1. Ultrasound-guided vascular access of the right common femoral artery. 2. Abdominal aortogram. 3. Selective catheterization and angiography of the inferior mesenteric artery, superior rectal artery, bilateral internal iliac arteries, and bilateral inferior rectal arteries 4. Gel-Foam embolization of the distal right inferior rectal artery MEDICATIONS: None. ANESTHESIA/SEDATION: Moderate (conscious) sedation was employed during this procedure. A total of Versed 0  mg and Fentanyl 0 mcg was administered intravenously. Moderate Sedation Time: 0 minutes. The patient's level of consciousness and vital signs were monitored continuously by radiology nursing throughout the procedure under my direct supervision. CONTRAST:  49mL OMNIPAQUE IOHEXOL 300 MG/ML SOLN, 72mL OMNIPAQUE IOHEXOL 300 MG/ML SOLN, 12mL OMNIPAQUE IOHEXOL 300 MG/ML SOLN FLUOROSCOPY TIME:  Fluoroscopy Time: 31 minutes 12 seconds (2,778 mGy). COMPLICATIONS: None immediate. PROCEDURE: Informed consent was obtained from the patient following explanation of the procedure, risks, benefits and alternatives. The patient understands, agrees and consents for the procedure. All questions were addressed. A time out was performed prior to the initiation of the procedure. Maximal barrier sterile technique utilized including caps, mask, sterile gowns, sterile gloves, large sterile drape, hand hygiene, and Betadine prep. The right groin was prepped and draped in standard fashion. Preprocedure ultrasound evaluation demonstrated patency of the right common femoral artery. A permanent image was captured and stored in the record. Subdermal Local anesthesia was provided at the planned needle entry site with 1% lidocaine. A small skin nick was made. Under direct ultrasound visualization, the right common femoral artery was accessed with standard micropuncture technique. A limited right lower extremity angiogram was performed which demonstrated adequate puncture site for closure device use. A J wire was directed to the abdominal aorta and exchanged for a 5 French, 11 cm vascular sheath. A RIM catheter was introduced and was attempted to be used to engage the IMA however was unsuccessful. Abdominal aortogram was then performed in a steep obliquity with a pigtail flush catheter  to demonstrate the ostium of the IMA. The pigtail catheter was then exchanged for a Mickelson catheter which successfully engaged the IMA ostium. A Cook cantata  microcatheter and a synchro soft microwire were then inserted into the proximal IMA. IMA angiogram was then performed which demonstrated patency of the superior rectal artery and its branches. There is mild hyperemia in the rectum without definite evidence of active extravasation. The microcatheter was removed and the Mickelson catheter was redirected to select the left common iliac artery. The catheter was exchanged over a Glidewire for a C2 catheter. The C2 catheter was uses likely internal iliac artery. Internal iliac angiogram was performed which demonstrated patent anterior and posterior proximal branches and in inferior rectal artery arising from the proximal anterior division. The microcatheter and wire were then used to select the inferior rectal artery proximally. Angiogram was performed which demonstrated no evidence of active extravasation. A Waltman loop was formed and the C2 catheter was used to select the right internal iliac artery. Angiogram of the right internal iliac artery demonstrated patent anterior posterior division within inferior rectal artery arising from the proximal aspect of the anterior division. The microcatheter and wire were used to select the inferior rectal artery. Angiogram demonstrated a possible focus of extravasation arising from a more superior branch of the distal inferior rectal artery. The distal branch was selected with the microcatheter. Additional angiogram was performed which demonstrated no evidence of active extravasation spilling into the rectum, however there was hyperemia and irregularity of the vessels in this region. Therefore, Gel-Foam embolization was performed using approximately 1 mL of Gel-Foam slurry. The catheter was retracted slightly and additional angiogram was performed which demonstrated complete embolization of the distal inferior rectal artery and its branches. No persistent hyperemia was observed. The microcatheter was removed. The C2 catheter was  advanced to un formed a Waltman loop and removed. A 6 French Angio-Seal was then placed and deployed without complication or change in pulses. The patient tolerated the procedure well and remained stable throughout the procedure. The patient was transferred to the floor in stable condition. IMPRESSION: 1. Possible focal extravasation versus hyperemia about a distal branch of the right inferior rectal artery. 2. Successful Gel-Foam slurry embolization of the distal right inferior rectal artery. Ruthann Cancer, MD [facility] Radiology Electronically Signed   By: Ruthann Cancer M.D.   On: 11/02/2021 16:47   IR Angiogram Selective Each Additional Vessel  Result Date: 11/02/2021 INDICATION: 65 year old male status post recent transrectal prostate biopsy presenting with hematochezia, anemia, and CT arteriogram evidence of acute hemorrhage from the rectum. EXAM: 1. Ultrasound-guided vascular access of the right common femoral artery. 2. Abdominal aortogram. 3. Selective catheterization and angiography of the inferior mesenteric artery, superior rectal artery, bilateral internal iliac arteries, and bilateral inferior rectal arteries 4. Gel-Foam embolization of the distal right inferior rectal artery MEDICATIONS: None. ANESTHESIA/SEDATION: Moderate (conscious) sedation was employed during this procedure. A total of Versed 0 mg and Fentanyl 0 mcg was administered intravenously. Moderate Sedation Time: 0 minutes. The patient's level of consciousness and vital signs were monitored continuously by radiology nursing throughout the procedure under my direct supervision. CONTRAST:  48mL OMNIPAQUE IOHEXOL 300 MG/ML SOLN, 19mL OMNIPAQUE IOHEXOL 300 MG/ML SOLN, 54mL OMNIPAQUE IOHEXOL 300 MG/ML SOLN FLUOROSCOPY TIME:  Fluoroscopy Time: 31 minutes 12 seconds (2,778 mGy). COMPLICATIONS: None immediate. PROCEDURE: Informed consent was obtained from the patient following explanation of  the procedure, risks, benefits and alternatives. The patient understands, agrees and consents  for the procedure. All questions were addressed. A time out was performed prior to the initiation of the procedure. Maximal barrier sterile technique utilized including caps, mask, sterile gowns, sterile gloves, large sterile drape, hand hygiene, and Betadine prep. The right groin was prepped and draped in standard fashion. Preprocedure ultrasound evaluation demonstrated patency of the right common femoral artery. A permanent image was captured and stored in the record. Subdermal Local anesthesia was provided at the planned needle entry site with 1% lidocaine. A small skin nick was made. Under direct ultrasound visualization, the right common femoral artery was accessed with standard micropuncture technique. A limited right lower extremity angiogram was performed which demonstrated adequate puncture site for closure device use. A J wire was directed to the abdominal aorta and exchanged for a 5 French, 11 cm vascular sheath. A RIM catheter was introduced and was attempted to be used to engage the IMA however was unsuccessful. Abdominal aortogram was then performed in a steep obliquity with a pigtail flush catheter to demonstrate the ostium of the IMA. The pigtail catheter was then exchanged for a Mickelson catheter which successfully engaged the IMA ostium. A Cook cantata microcatheter and a synchro soft microwire were then inserted into the proximal IMA. IMA angiogram was then performed which demonstrated patency of the superior rectal artery and its branches. There is mild hyperemia in the rectum without definite evidence of active extravasation. The microcatheter was removed and the Mickelson catheter was redirected to select the left common iliac artery. The catheter was exchanged over a Glidewire for a C2 catheter. The C2 catheter was uses likely internal iliac artery. Internal iliac angiogram was performed which  demonstrated patent anterior and posterior proximal branches and in inferior rectal artery arising from the proximal anterior division. The microcatheter and wire were then used to select the inferior rectal artery proximally. Angiogram was performed which demonstrated no evidence of active extravasation. A Waltman loop was formed and the C2 catheter was used to select the right internal iliac artery. Angiogram of the right internal iliac artery demonstrated patent anterior posterior division within inferior rectal artery arising from the proximal aspect of the anterior division. The microcatheter and wire were used to select the inferior rectal artery. Angiogram demonstrated a possible focus of extravasation arising from a more superior branch of the distal inferior rectal artery. The distal branch was selected with the microcatheter. Additional angiogram was performed which demonstrated no evidence of active extravasation spilling into the rectum, however there was hyperemia and irregularity of the vessels in this region. Therefore, Gel-Foam embolization was performed using approximately 1 mL of Gel-Foam slurry. The catheter was retracted slightly and additional angiogram was performed which demonstrated complete embolization of the distal inferior rectal artery and its branches. No persistent hyperemia was observed. The microcatheter was removed. The C2 catheter was advanced to un formed a Waltman loop and removed. A 6 French Angio-Seal was then placed and deployed without complication or change in pulses. The patient tolerated the procedure well and remained stable throughout the procedure. The patient was transferred to the floor in stable condition. IMPRESSION: 1. Possible focal extravasation versus hyperemia about a distal branch of the right inferior rectal artery. 2. Successful Gel-Foam slurry embolization of the distal right inferior rectal artery. Ruthann Cancer, MD [facility] Inter[facility] Radiology  Specialists [facility] Radiology Electronically Signed   By: Ruthann Cancer M.D.   On: 11/02/2021 16:47   IR Angiogram Selective Each Additional Vessel  Result Date: 11/02/2021 INDICATION: 65 year old male status  post recent transrectal prostate biopsy presenting with hematochezia, anemia, and CT arteriogram evidence of acute hemorrhage from the rectum. EXAM: 1. Ultrasound-guided vascular access of the right common femoral artery. 2. Abdominal aortogram. 3. Selective catheterization and angiography of the inferior mesenteric artery, superior rectal artery, bilateral internal iliac arteries, and bilateral inferior rectal arteries 4. Gel-Foam embolization of the distal right inferior rectal artery MEDICATIONS: None. ANESTHESIA/SEDATION: Moderate (conscious) sedation was employed during this procedure. A total of Versed 0 mg and Fentanyl 0 mcg was administered intravenously. Moderate Sedation Time: 0 minutes. The patient's level of consciousness and vital signs were monitored continuously by radiology nursing throughout the procedure under my direct supervision. CONTRAST:  24mL OMNIPAQUE IOHEXOL 300 MG/ML SOLN, 8mL OMNIPAQUE IOHEXOL 300 MG/ML SOLN, 77mL OMNIPAQUE IOHEXOL 300 MG/ML SOLN FLUOROSCOPY TIME:  Fluoroscopy Time: 31 minutes 12 seconds (2,778 mGy). COMPLICATIONS: None immediate. PROCEDURE: Informed consent was obtained from the patient following explanation of the procedure, risks, benefits and alternatives. The patient understands, agrees and consents for the procedure. All questions were addressed. A time out was performed prior to the initiation of the procedure. Maximal barrier sterile technique utilized including caps, mask, sterile gowns, sterile gloves, large sterile drape, hand hygiene, and Betadine prep. The right groin was prepped and draped in standard fashion. Preprocedure ultrasound evaluation demonstrated patency of the right common femoral artery. A permanent image was captured and stored in  the record. Subdermal Local anesthesia was provided at the planned needle entry site with 1% lidocaine. A small skin nick was made. Under direct ultrasound visualization, the right common femoral artery was accessed with standard micropuncture technique. A limited right lower extremity angiogram was performed which demonstrated adequate puncture site for closure device use. A J wire was directed to the abdominal aorta and exchanged for a 5 French, 11 cm vascular sheath. A RIM catheter was introduced and was attempted to be used to engage the IMA however was unsuccessful. Abdominal aortogram was then performed in a steep obliquity with a pigtail flush catheter to demonstrate the ostium of the IMA. The pigtail catheter was then exchanged for a Mickelson catheter which successfully engaged the IMA ostium. A Cook cantata microcatheter and a synchro soft microwire were then inserted into the proximal IMA. IMA angiogram was then performed which demonstrated patency of the superior rectal artery and its branches. There is mild hyperemia in the rectum without definite evidence of active extravasation. The microcatheter was removed and the Mickelson catheter was redirected to select the left common iliac artery. The catheter was exchanged over a Glidewire for a C2 catheter. The C2 catheter was uses likely internal iliac artery. Internal iliac angiogram was performed which demonstrated patent anterior and posterior proximal branches and in inferior rectal artery arising from the proximal anterior division. The microcatheter and wire were then used to select the inferior rectal artery proximally. Angiogram was performed which demonstrated no evidence of active extravasation. A Waltman loop was formed and the C2 catheter was used to select the right internal iliac artery. Angiogram of the right internal iliac artery demonstrated patent anterior posterior division within inferior rectal artery arising from the proximal aspect of  the anterior division. The microcatheter and wire were used to select the inferior rectal artery. Angiogram demonstrated a possible focus of extravasation arising from a more superior branch of the distal inferior rectal artery. The distal branch was selected with the microcatheter. Additional angiogram was performed which demonstrated no evidence of active extravasation spilling into the rectum, however there was  hyperemia and irregularity of the vessels in this region. Therefore, Gel-Foam embolization was performed using approximately 1 mL of Gel-Foam slurry. The catheter was retracted slightly and additional angiogram was performed which demonstrated complete embolization of the distal inferior rectal artery and its branches. No persistent hyperemia was observed. The microcatheter was removed. The C2 catheter was advanced to un formed a Waltman loop and removed. A 6 French Angio-Seal was then placed and deployed without complication or change in pulses. The patient tolerated the procedure well and remained stable throughout the procedure. The patient was transferred to the floor in stable condition. IMPRESSION: 1. Possible focal extravasation versus hyperemia about a distal branch of the right inferior rectal artery. 2. Successful Gel-Foam slurry embolization of the distal right inferior rectal artery. Ruthann Cancer, MD [facility]iology [facility] Radiology Electronically Signed   By: Ruthann Cancer M.D.   On: 11/02/2021 16:47   IR US Guide Vasc Access Right  Result Date: 11/02/2021 INDICATION: 65 year old male status post recent transrectal prostate biopsy presenting with hematochezia, anemia, and CT arteriogram evidence of acute hemorrhage from the rectum. EXAM: 1. Ultrasound-guided vascular access of the right common femoral artery. 2. Abdominal aortogram. 3. Selective catheterization and angiography of the inferior mesenteric artery, superior rectal artery, bilateral  internal iliac arteries, and bilateral inferior rectal arteries 4. Gel-Foam embolization of the distal right inferior rectal artery MEDICATIONS: None. ANESTHESIA/SEDATION: Moderate (conscious) sedation was employed during this procedure. A total of Versed 0 mg and Fentanyl 0 mcg was administered intravenously. Moderate Sedation Time: 0 minutes. The patient's level of consciousness and vital signs were monitored continuously by radiology nursing throughout the procedure under my direct supervision. CONTRAST:  93mL OMNIPAQUE IOHEXOL 300 MG/ML SOLN, 80mL OMNIPAQUE IOHEXOL 300 MG/ML SOLN, 64mL OMNIPAQUE IOHEXOL 300 MG/ML SOLN FLUOROSCOPY TIME:  Fluoroscopy Time: 31 minutes 12 seconds (2,778 mGy). COMPLICATIONS: None immediate. PROCEDURE: Informed consent was obtained from the patient following explanation of the procedure, risks, benefits and alternatives. The patient understands, agrees and consents for the procedure. All questions were addressed. A time out was performed prior to the initiation of the procedure. Maximal barrier sterile technique utilized including caps, mask, sterile gowns, sterile gloves, large sterile drape, hand hygiene, and Betadine prep. The right groin was prepped and draped in standard fashion. Preprocedure ultrasound evaluation demonstrated patency of the right common femoral artery. A permanent image was captured and stored in the record. Subdermal Local anesthesia was provided at the planned needle entry site with 1% lidocaine. A small skin nick was made. Under direct ultrasound visualization, the right common femoral artery was accessed with standard micropuncture technique. A limited right lower extremity angiogram was performed which demonstrated adequate puncture site for closure device use. A J wire was directed to the abdominal aorta and exchanged for a 5 French, 11 cm vascular sheath. A RIM catheter was introduced and was attempted to be used to engage the IMA however was  unsuccessful. Abdominal aortogram was then performed in a steep obliquity with a pigtail flush catheter to demonstrate the ostium of the IMA. The pigtail catheter was then exchanged for a Mickelson catheter which successfully engaged the IMA ostium. A Cook cantata microcatheter and a synchro soft microwire were then inserted into the proximal IMA. IMA angiogram was then performed which demonstrated patency of the superior rectal artery and its branches. There is mild hyperemia in the rectum without definite evidence of active extravasation. The microcatheter was removed and the Mickelson catheter was redirected to select the  left common iliac artery. The catheter was exchanged over a Glidewire for a C2 catheter. The C2 catheter was uses likely internal iliac artery. Internal iliac angiogram was performed which demonstrated patent anterior and posterior proximal branches and in inferior rectal artery arising from the proximal anterior division. The microcatheter and wire were then used to select the inferior rectal artery proximally. Angiogram was performed which demonstrated no evidence of active extravasation. A Waltman loop was formed and the C2 catheter was used to select the right internal iliac artery. Angiogram of the right internal iliac artery demonstrated patent anterior posterior division within inferior rectal artery arising from the proximal aspect of the anterior division. The microcatheter and wire were used to select the inferior rectal artery. Angiogram demonstrated a possible focus of extravasation arising from a more superior branch of the distal inferior rectal artery. The distal branch was selected with the microcatheter. Additional angiogram was performed which demonstrated no evidence of active extravasation spilling into the rectum, however there was hyperemia and irregularity of the vessels in this region. Therefore, Gel-Foam embolization was performed using approximately 1 mL of Gel-Foam  slurry. The catheter was retracted slightly and additional angiogram was performed which demonstrated complete embolization of the distal inferior rectal artery and its branches. No persistent hyperemia was observed. The microcatheter was removed. The C2 catheter was advanced to un formed a Waltman loop and removed. A 6 French Angio-Seal was then placed and deployed without complication or change in pulses. The patient tolerated the procedure well and remained stable throughout the procedure. The patient was transferred to the floor in stable condition. IMPRESSION: 1. Possible focal extravasation versus hyperemia about a distal branch of the right inferior rectal artery. 2. Successful Gel-Foam slurry embolization of the distal right inferior rectal artery. Ruthann Cancer, MD [facility] Electronically Signed   By: Ruthann Cancer M.D.   On: 11/02/2021 16:47   ECHOCARDIOGRAM COMPLETE  Result Date: 11/03/2021    ECHOCARDIOGRAM REPORT   Patient Name:   [name] Date of Exam: 11/03/2021 Medical Rec #:  [id number]       Height:       74.0 in Accession #:    [id number]      Weight:       250.0 lb Date of Birth:  05/25/1956       BSA:          2.390 m Patient Age:    69 years        BP:           118/73 mmHg Patient Gender: M               HR:           82 bpm. Exam Location:  Inpatient Procedure: 2D Echo, Color Doppler and Cardiac Doppler Indications:    Chest pain  History:        Patient has prior history of Echocardiogram examinations. Risk                 Factors:Hypertension, Diabetes and Sleep Apnea.  Sonographer:    Jyl Heinz Referring Phys: [id number] [place]  1. Left ventricular ejection fraction, by estimation, is 65 to 70%. The left ventricle has normal function. The left ventricle has no regional wall motion abnormalities. There is mild concentric left ventricular hypertrophy. Left ventricular diastolic parameters are  consistent with Grade I diastolic dysfunction (impaired relaxation).  2. Right ventricular systolic function is  normal. The right ventricular size is normal. Tricuspid regurgitation signal is inadequate for assessing PA pressure.  3. The mitral valve is grossly normal. No evidence of mitral valve regurgitation. No evidence of mitral stenosis.  4. The aortic valve is tricuspid. Aortic valve regurgitation is not visualized. No aortic stenosis is present.  5. Aortic dilatation noted. There is mild dilatation of the aortic root, measuring 40 mm. Conclusion(s)/Recommendation(s): Normal biventricular function without evidence of hemodynamically significant valvular heart disease. FINDINGS  Left Ventricle: Left ventricular ejection fraction, by estimation, is 65 to 70%. The left ventricle has normal function. The left ventricle has no regional wall motion abnormalities. The left ventricular internal cavity size was normal in size. There is  mild concentric left ventricular hypertrophy. Left ventricular diastolic parameters are consistent with Grade I diastolic dysfunction (impaired relaxation). Right Ventricle: The right ventricular size is normal. No increase in right ventricular wall thickness. Right ventricular systolic function is normal. Tricuspid regurgitation signal is inadequate for assessing PA pressure. Left Atrium: Left atrial size was normal in size. Right Atrium: Right atrial size was normal in size. Pericardium: There is no evidence of pericardial effusion. Mitral Valve: The mitral valve is grossly normal. Mild mitral annular calcification. No evidence of mitral valve regurgitation. No evidence of mitral valve stenosis. Tricuspid Valve: The tricuspid valve is grossly normal. Tricuspid valve regurgitation is trivial. No evidence of tricuspid stenosis. Aortic Valve: The aortic valve is tricuspid. Aortic valve regurgitation is not visualized. No aortic stenosis is present. Aortic valve mean gradient measures  10.0 mmHg. Aortic valve peak gradient measures 17.5 mmHg. Aortic valve area, by VTI measures 4.49 cm. Pulmonic Valve: The pulmonic valve was grossly normal. Pulmonic valve regurgitation is not visualized. No evidence of pulmonic stenosis. Aorta: Aortic dilatation noted. There is mild dilatation of the aortic root, measuring 40 mm. Venous: The inferior vena cava was not well visualized. IAS/Shunts: The atrial septum is grossly normal.  LEFT VENTRICLE PLAX 2D LVIDd:         4.10 cm     Diastology LVIDs:         2.50 cm     LV e' medial:    8.55 cm/s LV PW:         1.30 cm     LV E/e' medial:  10.2 LV IVS:        1.30 cm     LV e' lateral:   8.38 cm/s LVOT diam:     2.40 cm     LV E/e' lateral: 10.4 LV SV:         159 LV SV Index:   67 LVOT Area:     4.52 cm  LV Volumes (MOD) LV vol d, MOD A2C: 97.2 ml LV vol d, MOD A4C: 92.8 ml LV vol s, MOD A2C: 32.5 ml LV vol s, MOD A4C: 33.3 ml LV SV MOD A2C:     64.7 ml LV SV MOD A4C:     92.8 ml LV SV MOD BP:      63.0 ml RIGHT VENTRICLE RV Basal diam:  2.90 cm RV Mid diam:    2.60 cm RV S prime:     12.60 cm/s TAPSE (M-mode): 2.5 cm LEFT ATRIUM             Index        RIGHT ATRIUM           Index LA diam:        3.20 cm 1.34 cm/m  RA Area:     18.70 cm LA Vol (A2C):   47.4 ml 19.84 ml/m  RA Volume:   54.50 ml  22.81 ml/m LA Vol (A4C):   36.8 ml 15.40 ml/m LA Biplane Vol: 43.5 ml 18.20 ml/m  AORTIC VALVE AV Area (Vmax):    3.96 cm AV Area (Vmean):   4.21 cm AV Area (VTI):     4.49 cm AV Vmax:           209.00 cm/s AV Vmean:          152.500 cm/s AV VTI:            0.355 m AV Peak Grad:      17.5 mmHg AV Mean Grad:      10.0 mmHg LVOT Vmax:         183.00 cm/s LVOT Vmean:        142.000 cm/s LVOT VTI:          0.352 m LVOT/AV VTI ratio: 0.99  AORTA Ao Root diam: 4.00 cm Ao Asc diam:  3.40 cm MITRAL VALVE MV Area (PHT): 3.36 cm     SHUNTS MV Decel Time: 226 msec     Systemic VTI:  0.35 m MV E velocity: 86.90 cm/s   Systemic Diam: 2.40 cm MV A velocity: 128.00 cm/s MV  E/A ratio:  0.68 Eleonore Chiquito MD Electronically signed by Eleonore Chiquito MD Signature Date/Time: 11/03/2021/3:38:33 PM    Final    IR EMBO ART  VEN HEMORR LYMPH EXTRAV  INC GUIDE ROADMAPPING  Result Date: 11/02/2021 INDICATION: 65 year old male status post recent transrectal prostate biopsy presenting with hematochezia, anemia, and CT arteriogram evidence of acute hemorrhage from the rectum. EXAM: 1. Ultrasound-guided vascular access of the right common femoral artery. 2. Abdominal aortogram. 3. Selective catheterization and angiography of the inferior mesenteric artery, superior rectal artery, bilateral internal iliac arteries, and bilateral inferior rectal arteries 4. Gel-Foam embolization of the distal right inferior rectal artery MEDICATIONS: None. ANESTHESIA/SEDATION: Moderate (conscious) sedation was employed during this procedure. A total of Versed 0 mg and Fentanyl 0 mcg was administered intravenously. Moderate Sedation Time: 0 minutes. The patient's level of consciousness and vital signs were monitored continuously by radiology nursing throughout the procedure under my direct supervision. CONTRAST:  34mL OMNIPAQUE IOHEXOL 300 MG/ML SOLN, 63mL OMNIPAQUE IOHEXOL 300 MG/ML SOLN, 77mL OMNIPAQUE IOHEXOL 300 MG/ML SOLN FLUOROSCOPY TIME:  Fluoroscopy Time: 31 minutes 12 seconds (2,778 mGy). COMPLICATIONS: None immediate. PROCEDURE: Informed consent was obtained from the patient following explanation of the procedure, risks, benefits and alternatives. The patient understands, agrees and consents for the procedure. All questions were addressed. A time out was performed prior to the initiation of the procedure. Maximal barrier sterile technique utilized including caps, mask, sterile gowns, sterile gloves, large sterile drape, hand hygiene, and Betadine prep. The right groin was prepped and draped in standard fashion. Preprocedure ultrasound evaluation demonstrated patency of the right common femoral artery. A  permanent image was captured and stored in the record. Subdermal Local anesthesia was provided at the planned needle entry site with 1% lidocaine. A small skin nick was made. Under direct ultrasound visualization, the right common femoral artery was accessed with standard micropuncture technique. A limited right lower extremity angiogram was performed which demonstrated adequate puncture site for closure device use. A J wire was directed to the abdominal aorta and exchanged for a 5 French, 11 cm vascular sheath. A RIM catheter was introduced and was attempted to be used to engage  the IMA however was unsuccessful. Abdominal aortogram was then performed in a steep obliquity with a pigtail flush catheter to demonstrate the ostium of the IMA. The pigtail catheter was then exchanged for a Mickelson catheter which successfully engaged the IMA ostium. A Cook cantata microcatheter and a synchro soft microwire were then inserted into the proximal IMA. IMA angiogram was then performed which demonstrated patency of the superior rectal artery and its branches. There is mild hyperemia in the rectum without definite evidence of active extravasation. The microcatheter was removed and the Mickelson catheter was redirected to select the left common iliac artery. The catheter was exchanged over a Glidewire for a C2 catheter. The C2 catheter was uses likely internal iliac artery. Internal iliac angiogram was performed which demonstrated patent anterior and posterior proximal branches and in inferior rectal artery arising from the proximal anterior division. The microcatheter and wire were then used to select the inferior rectal artery proximally. Angiogram was performed which demonstrated no evidence of active extravasation. A Waltman loop was formed and the C2 catheter was used to select the right internal iliac artery. Angiogram of the right internal iliac artery demonstrated patent anterior posterior division within inferior rectal  artery arising from the proximal aspect of the anterior division. The microcatheter and wire were used to select the inferior rectal artery. Angiogram demonstrated a possible focus of extravasation arising from a more superior branch of the distal inferior rectal artery. The distal branch was selected with the microcatheter. Additional angiogram was performed which demonstrated no evidence of active extravasation spilling into the rectum, however there was hyperemia and irregularity of the vessels in this region. Therefore, Gel-Foam embolization was performed using approximately 1 mL of Gel-Foam slurry. The catheter was retracted slightly and additional angiogram was performed which demonstrated complete embolization of the distal inferior rectal artery and its branches. No persistent hyperemia was observed. The microcatheter was removed. The C2 catheter was advanced to un formed a Waltman loop and removed. A 6 French Angio-Seal was then placed and deployed without complication or change in pulses. The patient tolerated the procedure well and remained stable throughout the procedure. The patient was transferred to the floor in stable condition. IMPRESSION: 1. Possible focal extravasation versus hyperemia about a distal branch of the right inferior rectal artery. 2. Successful Gel-Foam slurry embolization of the distal right inferior rectal artery. Ruthann Cancer, MD [facility] Electronically Signed   By: Ruthann Cancer M.D.   On: 11/02/2021 16:47   CT Angio Abd/Pel w/ and/or w/o  Result Date: 11/02/2021 CLINICAL DATA:  Acute lower GI bleeding, status post prostate biopsy last week. EXAM: CT ANGIOGRAPHY ABDOMEN AND PELVIS WITH CONTRAST AND WITHOUT CONTRAST TECHNIQUE: Multidetector CT imaging of the abdomen and pelvis was performed using the standard protocol during bolus administration of intravenous contrast. Multiplanar reconstructed images and MIPs were  obtained and reviewed to evaluate the vascular anatomy. CONTRAST:  151mL OMNIPAQUE IOHEXOL 350 MG/ML SOLN COMPARISON:  10/08/2020 FINDINGS: VASCULAR Aorta: Minor aortic atherosclerosis. Negative for aneurysm, dissection or occlusive process. No retroperitoneal hemorrhage or hematoma. Celiac: Minor atherosclerotic origin but remains patent including its branches SMA: Minor atherosclerotic origin but remains patent including its branches Renals: Main renal arteries remain patent. Minor atherosclerotic changes. No accessory renal artery. IMA: Remains patent off the distal aorta anteriorly including its branches. Lower rectum active contrast extravasation on the arterial phase which progresses on the delayed portal venous phase imaging compatible with acute active lower rectal GI bleeding.  Inflow: Iliac atherosclerosis noted but no inflow disease or occlusion. Mild iliac tortuosity. Common, internal and external iliac arteries all remain patent. Proximal Outflow: Common femoral, proximal profunda femoral, proximal superficial femoral arteries all remain patent. Veins: No veno-occlusive process. Review of the MIP images confirms the above findings. NON-VASCULAR Lower chest: No acute abnormality. Hepatobiliary: No focal hepatic abnormality. Small left hepatic lobe noted. No biliary obstruction pattern dilatation. Patent portal and hepatic veins. Small gallstones noted. Common bile duct nondilated. Pancreas: Unremarkable. No pancreatic ductal dilatation or surrounding inflammatory changes. Spleen: Normal in size without focal abnormality. Adrenals/Urinary Tract: Adrenal glands are unremarkable. Kidneys are normal, without renal calculi, focal lesion, or hydronephrosis. Bladder is unremarkable. Stomach/Bowel: Postop changes from gastric bypass. Negative for bowel obstruction, significant dilatation, ileus, or free air. Appendix not visualized. No free fluid, fluid collection, abscess or ascites. Lymphatic: No bulky  adenopathy. Reproductive: No other significant finding by CT. Other: No abdominal wall hernia or abnormality. No abdominopelvic ascites. Musculoskeletal: Degenerative changes throughout the spine. Multilevel lower lumbar facet arthropathy. No acute osseous finding. IMPRESSION: VASCULAR Positive CTA for acute lower rectal GI bleeding. These results were called by telephone at the time of interpretation on 11/02/2021 at 11:16 am to provider AMY ESTERWOOD , who verbally acknowledged these results. NON-VASCULAR Remote gastric bypass surgery Incidental cholelithiasis Electronically Signed   By: Jerilynn Mages.  Shick M.D.   On: 11/02/2021 11:21      Assessment/Plan LGIB secondary to prostate biopsy -the patient has profound anemia secondary to his bleed, but also secondary to his Jehovah's witness state and refusal of blood products. -his vitals are currently stable -will place gel-foam rocket up rectum to help with coagulation.  Not much surgically we can offer for this at this point.  It is a possibility that if needed we could stitch the site in the rectum for which the biopsy was done to decrease blood loss through the rectum, but this isn't a guarantee it would fix the problem -he is currently in IR with plans for embolization of superior and bilateral rectal arteries.  This will increase his risk of ulceration and/or ischemia to his rectum, but currently the best shot we have at controlling his hemorrhage.   -we will follow, but will also defer to urology as well for opinions and thoughts on how to proceed as well as they have likely seen this more than general surgery.   FEN - NPO VTE - on hold ID - per medicine  ABL anemia CAD DM Jehovah's witness H/O PE/DVT  AKI - improving, per medicine  Henreitta Cea, [facility] 11/04/2021, 8:31 AM Please see Amion for pager number during day hours 7:00am-4:30pm or 7:00am -11:30am on weekends

## 2021-11-04 NOTE — Consult Note (Signed)
Chief Complaint: Acute lower gastrointestinal hemorrhage with profound anemia  Referring Physician(s): Hosie Poisson, MD  History of Present Illness: [name] is a 65 y.o. male,status post recent transrectal prostate biopsy complicated post-procedurally by acute rectal hemorrhage with CTA evidence of active extravasation.  He underwent mesenteric angiogram and right inferior rectal gelfoam embolization on 11/02/21 by me, at which time no active extravasation was visualized.  Due to progressively worsening anemia, now Hb 4.9, he underwent colonoscopic evaluation and no active hemorrhage was visualized.  He has continued to bleed and anemia is worsening.  He remains hemodynamically stable - normotensive and normocardic.  He was previously on coumadin however this has remained held, current INR 1.2.  Past Medical History:  Diagnosis Date   Arthritis    BACK PAIN    CAD (coronary artery disease) 06/28/2017   CAD in native artery    a. moderate by cardiac CT 2016.   Chronic bilateral low back pain with bilateral sciatica 01/12/2019   Degenerative arthritis of right knee 07/15/2016   Injected in 07/15/2016 Orthovisc injection started 10/07/2016 DepoMedrol 40 inj on 04/11/17, 25 cc aspirated Orthovisc injection in 05/30/2017     DEGENERATIVE JOINT DISEASE, KNEES, BILATERAL    Depression    Diabetes ([place]) 10/01/2014   Diabetes mellitus    type II   DIVERTICULOSIS, COLON    DVT, lower extremity ([place])    right   History of kidney stones    Hyperlipidemia    Hypertension    HYPOGONADISM, MALE    HYPOTENSION, ORTHOSTATIC    Kidney stones    Long term (current) use of anticoagulants 08/17/2017   Long term current use of anticoagulant    Lumbosacral radiculopathy at S1 06/14/2012   Maxillary sinus cyst 12/11/2018   Obesity    OBESITY, MORBID 03/04/2008   Qualifier: Diagnosis of  By: Jenny Reichmann MD, Hunt Oris    OSA (obstructive sleep apnea) 09/11/2013   Pulmonary emboli (H[facility])    Pulmonary  embolism ([place]) 02/05/2011   RBBB 07/28/2017   Renal insufficiency    a. prior h/o, does not appear chronic.   Superficial phlebitis of arm 03/18/2016   Vitamin D deficiency 07/09/2020    Past Surgical History:  Procedure Laterality Date   ankle surgury  2001 bilateral   with bone spurs and torn tendon   COLONOSCOPY  03/18/2010   EDG  2008   chronic Duodenitis   GASTRIC BYPASS     HEEL SPUR SURGERY Bilateral 2005   IR ANGIOGRAM PELVIS SELECTIVE OR SUPRASELECTIVE  11/02/2021   IR ANGIOGRAM SELECTIVE EACH ADDITIONAL VESSEL  11/02/2021   IR ANGIOGRAM SELECTIVE EACH ADDITIONAL VESSEL  11/02/2021   IR ANGIOGRAM SELECTIVE EACH ADDITIONAL VESSEL  11/02/2021   IR ANGIOGRAM VISCERAL SELECTIVE  11/02/2021   IR EMBO ART  VEN HEMORR LYMPH EXTRAV  INC GUIDE ROADMAPPING  11/02/2021   IR US GUIDE VASC ACCESS RIGHT  11/02/2021   knee surgury  2000 & 2003    cartilage damage   LEFT HEART CATH AND CORONARY ANGIOGRAPHY N/A 02/16/2021   Procedure: LEFT HEART CATH AND CORONARY ANGIOGRAPHY;  Surgeon: Sherren Mocha, MD;  Location: [facility];  Service: Cardiovascular;  Laterality: N/A;   LUMBAR LAMINECTOMY/DECOMPRESSION MICRODISCECTOMY  09/28/2012   Procedure: LUMBAR LAMINECTOMY/DECOMPRESSION MICRODISCECTOMY 2 LEVELS;  Surgeon: [name], MD;  Location: [facility];  Service: Orthopedics;  Laterality: Left;  Decompressive laminectomy L4-L5. Microdiscectomy L5-S1   Skin removal surgery     extra abdominal skin removed  from his weight loss   TONSILLECTOMY AND ADENOIDECTOMY  age 39 or 6   TOTAL KNEE ARTHROPLASTY  01/10/2012   Procedure: TOTAL KNEE ARTHROPLASTY;  Surgeon: [name], MD;  Location: [facility];  Service: Orthopedics;  Laterality: Left;   TOTAL KNEE ARTHROPLASTY Right 11/18/2020   Procedure: TOTAL KNEE ARTHROPLASTY;  Surgeon: Paralee Cancel, MD;  Location: [facility];  Service: Orthopedics;  Laterality: Right;  70 mins   UPPER GASTROINTESTINAL ENDOSCOPY  03/18/2010    Allergies: Adhesive  [tape] and Other  Medications: Prior to Admission medications   Medication Sig Start Date End Date Taking? Authorizing Provider  Artificial Tear Ointment (ARTIFICIAL TEARS) ointment Place 1 drop into both eyes every 4 (four) hours as needed (dry eyes).    Yes [provider]  atorvastatin (LIPITOR) 80 MG tablet Take 1 tablet (80 mg total) by mouth daily. 06/12/21  Yes Biagio Borg, MD  chlorthalidone (HYGROTON) 25 MG tablet TAKE 1 TABLET BY MOUTH EVERY DAY Patient taking differently: Take 25 mg by mouth daily. 10/20/21  Yes Biagio Borg, MD  Cholecalciferol (VITAMIN D3) 125 MCG (5000 UT) CAPS Take 5,000 Units by mouth daily.    Yes [provider]  Cyanocobalamin (VITAMIN B-12) 5000 MCG SUBL Place 5,000 mcg under the tongue 3 (three) times a week.   Yes [provider]  enoxaparin (LOVENOX) 120 MG/0.8ML injection Inject 0.8 mLs (120 mg total) into the skin every 12 (twelve) hours. As directed by anticoagulation clinic. 10/20/21  Yes Biagio Borg, MD  escitalopram (LEXAPRO) 10 MG tablet TAKE 1 TABLET BY MOUTH EVERY DAY Patient taking differently: Take 10 mg by mouth daily. 08/06/21  Yes Biagio Borg, MD  gabapentin (NEURONTIN) 100 MG capsule Take 2 capsules (200 mg total) by mouth at bedtime. 10/21/21  Yes Hulan Saas M, DO  glipiZIDE (GLUCOTROL XL) 2.5 MG 24 hr tablet TAKE 1 TABLET (2.5 MG TOTAL) BY MOUTH DAILY WITH BREAKFAST. 04/28/21  Yes Biagio Borg, MD  metFORMIN (GLUCOPHAGE) 500 MG tablet TAKE 4 TABLETS BY MOUTH DAILY WITH BREAKFAST Patient taking differently: Take 2,000 mg by mouth daily with breakfast. 06/02/21  Yes Biagio Borg, MD  Multiple Vitamin (MULTIVITAMIN WITH MINERALS) TABS tablet Take 1 tablet by mouth daily at 12 noon.   Yes [provider]  nitroGLYCERIN (NITROSTAT) 0.4 MG SL tablet Place 1 tablet (0.4 mg total) under the tongue every 5 (five) minutes as needed for chest pain. 07/30/15  Yes Dorothy Spark, MD  pantoprazole (PROTONIX)  40 MG tablet TAKE 1 TABLET BY MOUTH EVERY DAY Patient taking differently: 40 mg daily. 06/02/21  Yes Biagio Borg, MD  warfarin (COUMADIN) 10 MG tablet Take 1 tablet daily or as directed by anticoagulation clinic 10/20/21  Yes Biagio Borg, MD  fluticasone [facility]) 50 MCG/ACT nasal spray Place 2 sprays into both nostrils daily as needed for allergies. Patient not taking: Reported on 11/02/2021 11/28/18   Rama, Venetia Maxon, MD     Family History  Problem Relation Age of Onset   Hypertension Mother    Prostate cancer Father    Diabetes Sister        died with DM 2001-03-04, 2 more sisters with diabetes   Heart failure Sister    Diabetes Brother    Diabetes Sister    Diabetes Sister    Colon cancer Neg Hx    Colon polyps Neg Hx    Esophageal cancer Neg Hx    Rectal cancer Neg  Hx    Stomach cancer Neg Hx     Social History   Socioeconomic History   Marital status: Married    Spouse name: Not on file   Number of children: 2   Years of education: Not on file   Highest education level: High school graduate  Occupational History   Occupation: Retired    Fish farm manager: [employer]  Tobacco Use   Smoking status: Former    Packs/day: 0.25    Years: 5.00    Pack years: 1.25    Types: Cigarettes    Quit date: 12/06/1978    Years since quitting: 42.9   Smokeless tobacco: Never  Vaping Use   Vaping Use: Never used  Substance and Sexual Activity   Alcohol use: Yes    Alcohol/week: 2.0 standard drinks    Types: 2 Standard drinks or equivalent per week   Drug use: No   Sexual activity: Not Currently  Other Topics Concern   Not on file  Social History Narrative   Daily caffeine use one per day   Protein drinks    Children; a friend and sister that helps   Brother that is around [place])   Son and dtr in [place]   Lives w/ wife in [place]   Left handed     Retired from [employer]   Former smoker 2 alcoholic beverages daily no caffeine no drug use no other tobacco at this time    Social Determinants of Radio broadcast assistant Strain: Not on file  Food Insecurity: Not on file  Transportation Needs: Not on file  Physical Activity: Not on file  Stress: Not on file  Social Connections: Not on file    Review of Systems: A 12 point ROS discussed and pertinent positives are indicated in the HPI above.  All other systems are negative.  Vital Signs: BP 103/62 (BP Location: Left Arm)   Pulse 71   Temp 98.7 F (37.1 C) (Oral)   Resp 20   Ht 6\' 2"  (1.88 m)   Wt 122 kg   SpO2 95%   BMI 34.53 kg/m   Physical Exam Constitutional:      General: He is not in acute distress. HENT:     Head: Normocephalic.     Mouth/Throat:     Mouth: Mucous membranes are moist.     Comments: MP2 Cardiovascular:     Rate and Rhythm: Normal rate and regular rhythm.  Pulmonary:     Effort: No respiratory distress.     Breath sounds: Normal breath sounds.  Abdominal:     General: There is no distension.  Musculoskeletal:     Right lower leg: No edema.     Left lower leg: No edema.  Skin:    General: Skin is warm and dry.  Neurological:     Mental Status: He is alert and oriented to person, place, and time.    Imaging: No new recent pertinent imaging.  Labs:  CBC: Recent Labs    02/10/21 1205 03/10/21 0900 11/02/21 0340 11/02/21 1224 11/03/21 0455 11/03/21 1142 11/03/21 1737 11/03/21 2137 11/04/21 0501  WBC 8.6 6.7 6.0  --  7.8  --   --   --   --   HGB 13.0 12.0* 11.5*   < > 6.8* 6.0* 6.6* 5.2* 4.9*  HCT 43.9 38.0* 37.7*   < > 22.2* 19.5* 21.3* 16.9* 15.5*  PLT 285 257.0 191  --  146*  --   --   --   --    < > =  values in this interval not displayed.    COAGS: Recent Labs    11/18/20 1013 11/19/20 0318 10/22/21 0000 10/28/21 0000 11/02/21 0638 11/03/21 2137  INR  --    < > 2.5 1.2* 1.5* 1.2  APTT 32  --   --   --   --   --    < > = values in this interval not displayed.    BMP: Recent Labs    11/11/20 0836 11/19/20 0318 12/12/20 1602  03/10/21 0900 09/11/21 0812 11/02/21 0340 11/03/21 0455  NA 138 135   < > 141 141 139 134*  K 4.5 4.5   < > 4.4 4.6 4.9 4.5  CL 99 102   < > 103 105 105 107  CO2 29 23   < > 30 28 25  21*  GLUCOSE 139* 202*   < > 100* 94 101* 168*  BUN 24* 30*   < > 26* 30* 31* 36*  CALCIUM 9.3 8.4*   < > 8.9  9.3 9.4  9.2 8.9 8.1*  CREATININE 1.25* 1.31*   < > 1.26 1.41 1.29* 1.67*  GFRNONAA >60 >60  --   --   --  >60 45*   < > = values in this interval not displayed.    LIVER FUNCTION TESTS: Recent Labs    03/10/21 0900 09/11/21 0812 11/02/21 0340 11/03/21 0455  BILITOT 0.5 0.7 0.5 0.5  AST 17 18 43* 28  ALT 23 22 34 27  ALKPHOS 65 63 48 37*  PROT 6.6 6.6 6.8 4.9*  ALBUMIN 4.2 4.2 4.0 3.1*    TUMOR MARKERS: No results for input(s): AFPTM, CEA, CA199, CHROMGRNA in the last 8760 hours.  Assessment and Plan: 65 year old male with rectal hemorrhage status post recent transrectal prostate biopsy and status post right inferior rectal gelfoam embolization on 11/02/21 with recurrent hemorrhage and profound anemia.  He refuses blood transfusion as he is Jehovah's Witness.  Currently hemodynamically stable.  Plan for mesenteric angiogram and possible embolization.  Thank you for this interesting consult.  I greatly enjoyed meeting [name] and look forward to participating in their care.  A copy of this report was sent to the requesting provider on this date.  Electronically Signed: Suzette Battiest, MD 11/04/2021, 7:37 AM   I spent a total of 20 Minutes  in face to face in clinical consultation, greater than 50% of which was counseling/coordinating care for acute rectal hemorrhage.

## 2021-11-05 ENCOUNTER — Encounter (HOSPITAL_COMMUNITY): Payer: Self-pay | Admitting: Gastroenterology

## 2021-11-05 DIAGNOSIS — E538 Deficiency of other specified B group vitamins: Secondary | ICD-10-CM | POA: Diagnosis not present

## 2021-11-05 DIAGNOSIS — D62 Acute posthemorrhagic anemia: Secondary | ICD-10-CM | POA: Diagnosis not present

## 2021-11-05 DIAGNOSIS — K922 Gastrointestinal hemorrhage, unspecified: Secondary | ICD-10-CM | POA: Diagnosis not present

## 2021-11-05 DIAGNOSIS — E1169 Type 2 diabetes mellitus with other specified complication: Secondary | ICD-10-CM | POA: Diagnosis not present

## 2021-11-05 DIAGNOSIS — K921 Melena: Secondary | ICD-10-CM | POA: Diagnosis not present

## 2021-11-05 DIAGNOSIS — R935 Abnormal findings on diagnostic imaging of other abdominal regions, including retroperitoneum: Secondary | ICD-10-CM | POA: Diagnosis not present

## 2021-11-05 LAB — HEMOGLOBIN AND HEMATOCRIT, BLOOD
HCT: 21.6 % — ABNORMAL LOW (ref 39.0–52.0)
HCT: 16.5 % — ABNORMAL LOW (ref 39.0–52.0)
Hemoglobin: 6.7 g/dL — CL (ref 13.0–17.0)
Hemoglobin: 5.2 g/dL — CL (ref 13.0–17.0)

## 2021-11-05 LAB — GLUCOSE, CAPILLARY
Glucose-Capillary: 120 mg/dL — ABNORMAL HIGH (ref 70–99)
Glucose-Capillary: 185 mg/dL — ABNORMAL HIGH (ref 70–99)
Glucose-Capillary: 132 mg/dL — ABNORMAL HIGH (ref 70–99)
Glucose-Capillary: 128 mg/dL — ABNORMAL HIGH (ref 70–99)

## 2021-11-05 MED ORDER — LIP MEDEX EX OINT
TOPICAL_OINTMENT | CUTANEOUS | Status: DC | PRN
  Filled 2021-11-05: qty 7

## 2021-11-05 MED ORDER — SODIUM CHLORIDE 0.9 % IV SOLN
510.0000 mg | Freq: Once | INTRAVENOUS | Status: AC
  Administered 2021-11-05: 510 mg via INTRAVENOUS
  Filled 2021-11-05: qty 17

## 2021-11-05 NOTE — Plan of Care (Signed)
  Problem: Nutrition: Goal: Adequate nutrition will be maintained Outcome: Progressing   Problem: Coping: Goal: Level of anxiety will decrease Outcome: Progressing   

## 2021-11-05 NOTE — Progress Notes (Addendum)
[facility] Progress Note  CC:  Acute lower GI  bleed   Subjective: He feels well this morning.  He is sitting up in his bed.  His wife is at the bedside.  He is tolerating a clear liquid diet.  No nausea or vomiting.  No upper or lower abdominal pain.  No rectal bleeding.  No bowel movement but he is passing gas per the rectum.  Objective:  Vital signs in last 24 hours: Temp:  [98.4 F (36.9 C)-100.1 F (37.8 C)] 98.7 F (37.1 C) (12/01 0429) Pulse Rate:  [64-77] 65 (12/01 0429) Resp:  [15-21] 18 (12/01 0429) BP: (103-131)/(57-77) 103/58 (12/01 0429) SpO2:  [95 %-100 %] 98 % (12/01 0429) Last BM Date: 11/03/21  General:   Alert 65 year old male in no acute distress. Heart: Regular rate and rhythm, no murmurs. Pulm: Breath sounds clear throughout. Abdomen: Soft, nondistended.  Nontender.  Positive bowel sounds to all 4 quadrants.  Right femoral angiogram drsg site intact. Extremities: No edema. Neurologic:  Alert and  oriented x4. Grossly normal neurologically. Psych:  Alert and cooperative. Normal mood and affect.  Intake/Output from previous day: 11/30 0701 - 12/01 0700 In: 2083.6 [P.O.:830; I.V.:1253.6] Out: 3329 [Urine:2455] Intake/Output this shift: No intake/output data recorded.  Lab Results: Recent Labs    11/03/21 0455 11/03/21 1142 11/04/21 1315 11/04/21 1843 11/05/21 0050  WBC 7.8  --   --   --   --   HGB 6.8*   < > 5.4* 4.9* 6.7*  HCT 22.2*   < > 17.1* 15.5* 21.6*  PLT 146*  --   --   --   --    < > = values in this interval not displayed.   BMET Recent Labs    11/03/21 0455 11/04/21 0501  NA 134* 135  K 4.5 4.0  CL 107 110  CO2 21* 24  GLUCOSE 168* 117*  BUN 36* 25*  CREATININE 1.67* 1.26*  CALCIUM 8.1* 7.6*   LFT Recent Labs    11/03/21 0455  PROT 4.9*  ALBUMIN 3.1*  AST 28  ALT 27  ALKPHOS 37*  BILITOT 0.5   PT/INR Recent Labs    11/03/21 2137  LABPROT 15.6*  INR 1.2   Hepatitis Panel No results for  input(s): HEPBSAG, HCVAB, HEPAIGM, HEPBIGM in the last 72 hours.  IR Angiogram Visceral Selective  Result Date: 11/04/2021 INDICATION: 65 year old male with rectal hemorrhage status post recent transrectal prostate biopsy and status post right inferior rectal gelfoam embolization on 11/02/21 with recurrent hemorrhage and profound anemia. He refuses blood transfusion as he is Jehovah's Witness. Currently hemodynamically stable. EXAM: 1. Ultrasound-guided vascular access of the right common femoral artery. 2. Selective catheterization angiography of the inferior mesenteric artery, superior rectal artery, and anterior divisions of the bilateral internal iliac arteries 3. Particle embolization of the superior rectal artery. 4. Gel-Foam slurry embolization of the anterior division of the right internal iliac artery. MEDICATIONS: None. ANESTHESIA/SEDATION: Moderate (conscious) sedation was employed during this procedure. A total of Versed 2 mg and Fentanyl 100 mcg was administered intravenously. Moderate Sedation Time: 76 minutes. The patient's level of consciousness and vital signs were monitored continuously by radiology nursing throughout the procedure under my direct supervision. CONTRAST:  64mL OMNIPAQUE IOHEXOL 300 MG/ML SOLN, 72mL OMNIPAQUE IOHEXOL 300 MG/ML SOLN, 64mL OMNIPAQUE IOHEXOL 300 MG/ML SOLN FLUOROSCOPY TIME:  Fluoroscopy Time: 29 minutes 30 seconds (1,318 mGy). COMPLICATIONS: None immediate. PROCEDURE: Informed consent was obtained from the patient  following explanation of the procedure, risks, benefits and alternatives. The patient understands, agrees and consents for the procedure. All questions were addressed. A time out was performed prior to the initiation of the procedure. Maximal barrier sterile technique utilized including caps, mask, sterile gowns, sterile gloves, large sterile drape, hand hygiene, and Betadine prep. The right groin was prepped and draped in standard fashion. Preprocedure  ultrasound evaluation demonstrated patency of the right common femoral artery. A permanent image was captured and stored in the record. Subdermal Local anesthesia was provided at the planned needle entry site with 1% lidocaine. A small skin nick was made. Under direct ultrasound visualization, the right common femoral artery is accessed with standard micropuncture technique. A limited right lower extremity angiogram was performed which demonstrated adequate puncture site for closure device use. A J wire was directed to the abdominal aorta exchanged for a 5 French, 11 cm vascular sheath. A Mickelson catheter was then used to select the inferior mesenteric artery ostium. Inferior mesenteric angiogram was performed which demonstrated patency of the IMA. A Cook cantata microcatheter and a synchro soft microwire were then inserted into the proximal IMA and directed to the superior rectal artery. Angiogram of the superior rectal artery was performed which demonstrated perfusion of the bilateral rectum without any evidence of active extravasation. There was a small focal focus of hyperemia about the rectum which could represent etiology of hemorrhage. With the catheter parked at the bifurcation of the proximal superior rectal artery, particle embolization was performed using 500-700 micron embospheres. Near complete stasis was achieved. The microcatheter was removed and the Mickelson catheter was redirected to select the left common iliac artery. The catheter was exchanged over Glidewire for a C2 catheter. The C2 catheter was used to select the internal iliac artery. Internal iliac angiogram was performed which demonstrated patent anterior and posterior proximal branches and an inferior rectal artery arising from the proximal anterior division. Attempts at cannulating the inferior rectal artery were made with the microcatheter and microwire, however vessel spasm occurred in the inferior rectal artery was no longer visible.  The microcatheter was removed and a Waltman loop was formed to redirect the C2 catheter into the right internal iliac artery. Angiogram of the right internal iliac artery demonstrated patent anterior posterior division with the inferior rectal artery arising from the proximal aspect of the anterior division. The microcatheter and wire used to select the anterior division of the internal iliac artery. Position was confirmed with limited angiogram. A Gel-Foam slurry was made and embolization of the anterior division of the right internal iliac artery was performed. Upon completion of embolization, there is sluggish pulsatile antegrade flow with pruning of the distal branches including the inferior rectal artery. The microcatheter was removed and the C2 catheter was redirected to the common iliac artery and removed over a J wire. A 6 French Angio-Seal was then placed and deployed without complication or change in pulses. The patient tolerated the procedure well and remained stable throughout the procedure. The patient was transferred to the floor in stable condition. IMPRESSION: 1. Rectal hyperemia without evidence of active extravasation. 2. Prophylactic superior rectal artery particle embolization and Gelfoam embolization of the anterior division of the right common iliac artery Ruthann Cancer, MD [facility] Radiology Electronically Signed   By: Ruthann Cancer M.D.   On: 11/04/2021 13:14   IR Angiogram Pelvis Selective Or Supraselective  Result Date: 11/04/2021 INDICATION: 65 year old male with rectal hemorrhage status post recent transrectal  prostate biopsy and status post right inferior rectal gelfoam embolization on 11/02/21 with recurrent hemorrhage and profound anemia. He refuses blood transfusion as he is Jehovah's Witness. Currently hemodynamically stable. EXAM: 1. Ultrasound-guided vascular access of the right common femoral artery. 2. Selective  catheterization angiography of the inferior mesenteric artery, superior rectal artery, and anterior divisions of the bilateral internal iliac arteries 3. Particle embolization of the superior rectal artery. 4. Gel-Foam slurry embolization of the anterior division of the right internal iliac artery. MEDICATIONS: None. ANESTHESIA/SEDATION: Moderate (conscious) sedation was employed during this procedure. A total of Versed 2 mg and Fentanyl 100 mcg was administered intravenously. Moderate Sedation Time: 76 minutes. The patient's level of consciousness and vital signs were monitored continuously by radiology nursing throughout the procedure under my direct supervision. CONTRAST:  51mL OMNIPAQUE IOHEXOL 300 MG/ML SOLN, 94mL OMNIPAQUE IOHEXOL 300 MG/ML SOLN, 62mL OMNIPAQUE IOHEXOL 300 MG/ML SOLN FLUOROSCOPY TIME:  Fluoroscopy Time: 29 minutes 30 seconds (1,318 mGy). COMPLICATIONS: None immediate. PROCEDURE: Informed consent was obtained from the patient following explanation of the procedure, risks, benefits and alternatives. The patient understands, agrees and consents for the procedure. All questions were addressed. A time out was performed prior to the initiation of the procedure. Maximal barrier sterile technique utilized including caps, mask, sterile gowns, sterile gloves, large sterile drape, hand hygiene, and Betadine prep. The right groin was prepped and draped in standard fashion. Preprocedure ultrasound evaluation demonstrated patency of the right common femoral artery. A permanent image was captured and stored in the record. Subdermal Local anesthesia was provided at the planned needle entry site with 1% lidocaine. A small skin nick was made. Under direct ultrasound visualization, the right common femoral artery is accessed with standard micropuncture technique. A limited right lower extremity angiogram was performed which demonstrated adequate puncture site for closure device use. A J wire was directed to the  abdominal aorta exchanged for a 5 French, 11 cm vascular sheath. A Mickelson catheter was then used to select the inferior mesenteric artery ostium. Inferior mesenteric angiogram was performed which demonstrated patency of the IMA. A Cook cantata microcatheter and a synchro soft microwire were then inserted into the proximal IMA and directed to the superior rectal artery. Angiogram of the superior rectal artery was performed which demonstrated perfusion of the bilateral rectum without any evidence of active extravasation. There was a small focal focus of hyperemia about the rectum which could represent etiology of hemorrhage. With the catheter parked at the bifurcation of the proximal superior rectal artery, particle embolization was performed using 500-700 micron embospheres. Near complete stasis was achieved. The microcatheter was removed and the Mickelson catheter was redirected to select the left common iliac artery. The catheter was exchanged over Glidewire for a C2 catheter. The C2 catheter was used to select the internal iliac artery. Internal iliac angiogram was performed which demonstrated patent anterior and posterior proximal branches and an inferior rectal artery arising from the proximal anterior division. Attempts at cannulating the inferior rectal artery were made with the microcatheter and microwire, however vessel spasm occurred in the inferior rectal artery was no longer visible. The microcatheter was removed and a Waltman loop was formed to redirect the C2 catheter into the right internal iliac artery. Angiogram of the right internal iliac artery demonstrated patent anterior posterior division with the inferior rectal artery arising from the proximal aspect of the anterior division. The microcatheter and wire used to select the anterior division of the internal iliac artery. Position was confirmed with limited  angiogram. A Gel-Foam slurry was made and embolization of the anterior division of the  right internal iliac artery was performed. Upon completion of embolization, there is sluggish pulsatile antegrade flow with pruning of the distal branches including the inferior rectal artery. The microcatheter was removed and the C2 catheter was redirected to the common iliac artery and removed over a J wire. A 6 French Angio-Seal was then placed and deployed without complication or change in pulses. The patient tolerated the procedure well and remained stable throughout the procedure. The patient was transferred to the floor in stable condition. IMPRESSION: 1. Rectal hyperemia without evidence of active extravasation. 2. Prophylactic superior rectal artery particle embolization and Gelfoam embolization of the anterior division of the right common iliac artery Ruthann Cancer, MD [facility] Radiology Electronically Signed   By: Ruthann Cancer M.D.   On: 11/04/2021 13:14   IR Angiogram Selective Each Additional Vessel  Result Date: 11/04/2021 INDICATION: 65 year old male with rectal hemorrhage status post recent transrectal prostate biopsy and status post right inferior rectal gelfoam embolization on 11/02/21 with recurrent hemorrhage and profound anemia. He refuses blood transfusion as he is Jehovah's Witness. Currently hemodynamically stable. EXAM: 1. Ultrasound-guided vascular access of the right common femoral artery. 2. Selective catheterization angiography of the inferior mesenteric artery, superior rectal artery, and anterior divisions of the bilateral internal iliac arteries 3. Particle embolization of the superior rectal artery. 4. Gel-Foam slurry embolization of the anterior division of the right internal iliac artery. MEDICATIONS: None. ANESTHESIA/SEDATION: Moderate (conscious) sedation was employed during this procedure. A total of Versed 2 mg and Fentanyl 100 mcg was administered intravenously. Moderate Sedation Time: 76 minutes. The patient's level of  consciousness and vital signs were monitored continuously by radiology nursing throughout the procedure under my direct supervision. CONTRAST:  19mL OMNIPAQUE IOHEXOL 300 MG/ML SOLN, 66mL OMNIPAQUE IOHEXOL 300 MG/ML SOLN, 93mL OMNIPAQUE IOHEXOL 300 MG/ML SOLN FLUOROSCOPY TIME:  Fluoroscopy Time: 29 minutes 30 seconds (1,318 mGy). COMPLICATIONS: None immediate. PROCEDURE: Informed consent was obtained from the patient following explanation of the procedure, risks, benefits and alternatives. The patient understands, agrees and consents for the procedure. All questions were addressed. A time out was performed prior to the initiation of the procedure. Maximal barrier sterile technique utilized including caps, mask, sterile gowns, sterile gloves, large sterile drape, hand hygiene, and Betadine prep. The right groin was prepped and draped in standard fashion. Preprocedure ultrasound evaluation demonstrated patency of the right common femoral artery. A permanent image was captured and stored in the record. Subdermal Local anesthesia was provided at the planned needle entry site with 1% lidocaine. A small skin nick was made. Under direct ultrasound visualization, the right common femoral artery is accessed with standard micropuncture technique. A limited right lower extremity angiogram was performed which demonstrated adequate puncture site for closure device use. A J wire was directed to the abdominal aorta exchanged for a 5 French, 11 cm vascular sheath. A Mickelson catheter was then used to select the inferior mesenteric artery ostium. Inferior mesenteric angiogram was performed which demonstrated patency of the IMA. A Cook cantata microcatheter and a synchro soft microwire were then inserted into the proximal IMA and directed to the superior rectal artery. Angiogram of the superior rectal artery was performed which demonstrated perfusion of the bilateral rectum without any evidence of active extravasation. There was a  small focal focus of hyperemia about the rectum which could represent etiology of hemorrhage. With the catheter parked at  the bifurcation of the proximal superior rectal artery, particle embolization was performed using 500-700 micron embospheres. Near complete stasis was achieved. The microcatheter was removed and the Mickelson catheter was redirected to select the left common iliac artery. The catheter was exchanged over Glidewire for a C2 catheter. The C2 catheter was used to select the internal iliac artery. Internal iliac angiogram was performed which demonstrated patent anterior and posterior proximal branches and an inferior rectal artery arising from the proximal anterior division. Attempts at cannulating the inferior rectal artery were made with the microcatheter and microwire, however vessel spasm occurred in the inferior rectal artery was no longer visible. The microcatheter was removed and a Waltman loop was formed to redirect the C2 catheter into the right internal iliac artery. Angiogram of the right internal iliac artery demonstrated patent anterior posterior division with the inferior rectal artery arising from the proximal aspect of the anterior division. The microcatheter and wire used to select the anterior division of the internal iliac artery. Position was confirmed with limited angiogram. A Gel-Foam slurry was made and embolization of the anterior division of the right internal iliac artery was performed. Upon completion of embolization, there is sluggish pulsatile antegrade flow with pruning of the distal branches including the inferior rectal artery. The microcatheter was removed and the C2 catheter was redirected to the common iliac artery and removed over a J wire. A 6 French Angio-Seal was then placed and deployed without complication or change in pulses. The patient tolerated the procedure well and remained stable throughout the procedure. The patient was transferred to the floor in  stable condition. IMPRESSION: 1. Rectal hyperemia without evidence of active extravasation. 2. Prophylactic superior rectal artery particle embolization and Gelfoam embolization of the anterior division of the right common iliac artery Ruthann Cancer, MD [facility] Radiology Electronically Signed   By: Ruthann Cancer M.D.   On: 11/04/2021 13:14   IR Angiogram Selective Each Additional Vessel  Result Date: 11/04/2021 INDICATION: 65 year old male with rectal hemorrhage status post recent transrectal prostate biopsy and status post right inferior rectal gelfoam embolization on 11/02/21 with recurrent hemorrhage and profound anemia. He refuses blood transfusion as he is Jehovah's Witness. Currently hemodynamically stable. EXAM: 1. Ultrasound-guided vascular access of the right common femoral artery. 2. Selective catheterization angiography of the inferior mesenteric artery, superior rectal artery, and anterior divisions of the bilateral internal iliac arteries 3. Particle embolization of the superior rectal artery. 4. Gel-Foam slurry embolization of the anterior division of the right internal iliac artery. MEDICATIONS: None. ANESTHESIA/SEDATION: Moderate (conscious) sedation was employed during this procedure. A total of Versed 2 mg and Fentanyl 100 mcg was administered intravenously. Moderate Sedation Time: 76 minutes. The patient's level of consciousness and vital signs were monitored continuously by radiology nursing throughout the procedure under my direct supervision. CONTRAST:  69mL OMNIPAQUE IOHEXOL 300 MG/ML SOLN, 69mL OMNIPAQUE IOHEXOL 300 MG/ML SOLN, 47mL OMNIPAQUE IOHEXOL 300 MG/ML SOLN FLUOROSCOPY TIME:  Fluoroscopy Time: 29 minutes 30 seconds (1,318 mGy). COMPLICATIONS: None immediate. PROCEDURE: Informed consent was obtained from the patient following explanation of the procedure, risks, benefits and alternatives. The patient understands, agrees and  consents for the procedure. All questions were addressed. A time out was performed prior to the initiation of the procedure. Maximal barrier sterile technique utilized including caps, mask, sterile gowns, sterile gloves, large sterile drape, hand hygiene, and Betadine prep. The right groin was prepped and draped in standard fashion. Preprocedure ultrasound evaluation demonstrated patency  of the right common femoral artery. A permanent image was captured and stored in the record. Subdermal Local anesthesia was provided at the planned needle entry site with 1% lidocaine. A small skin nick was made. Under direct ultrasound visualization, the right common femoral artery is accessed with standard micropuncture technique. A limited right lower extremity angiogram was performed which demonstrated adequate puncture site for closure device use. A J wire was directed to the abdominal aorta exchanged for a 5 French, 11 cm vascular sheath. A Mickelson catheter was then used to select the inferior mesenteric artery ostium. Inferior mesenteric angiogram was performed which demonstrated patency of the IMA. A Cook cantata microcatheter and a synchro soft microwire were then inserted into the proximal IMA and directed to the superior rectal artery. Angiogram of the superior rectal artery was performed which demonstrated perfusion of the bilateral rectum without any evidence of active extravasation. There was a small focal focus of hyperemia about the rectum which could represent etiology of hemorrhage. With the catheter parked at the bifurcation of the proximal superior rectal artery, particle embolization was performed using 500-700 micron embospheres. Near complete stasis was achieved. The microcatheter was removed and the Mickelson catheter was redirected to select the left common iliac artery. The catheter was exchanged over Glidewire for a C2 catheter. The C2 catheter was used to select the internal iliac artery. Internal iliac  angiogram was performed which demonstrated patent anterior and posterior proximal branches and an inferior rectal artery arising from the proximal anterior division. Attempts at cannulating the inferior rectal artery were made with the microcatheter and microwire, however vessel spasm occurred in the inferior rectal artery was no longer visible. The microcatheter was removed and a Waltman loop was formed to redirect the C2 catheter into the right internal iliac artery. Angiogram of the right internal iliac artery demonstrated patent anterior posterior division with the inferior rectal artery arising from the proximal aspect of the anterior division. The microcatheter and wire used to select the anterior division of the internal iliac artery. Position was confirmed with limited angiogram. A Gel-Foam slurry was made and embolization of the anterior division of the right internal iliac artery was performed. Upon completion of embolization, there is sluggish pulsatile antegrade flow with pruning of the distal branches including the inferior rectal artery. The microcatheter was removed and the C2 catheter was redirected to the common iliac artery and removed over a J wire. A 6 French Angio-Seal was then placed and deployed without complication or change in pulses. The patient tolerated the procedure well and remained stable throughout the procedure. The patient was transferred to the floor in stable condition. IMPRESSION: 1. Rectal hyperemia without evidence of active extravasation. 2. Prophylactic superior rectal artery particle embolization and Gelfoam embolization of the anterior division of the right common iliac artery Ruthann Cancer, MD [facility] Radiology Electronically Signed   By: Ruthann Cancer M.D.   On: 11/04/2021 13:14   IR Angiogram Selective Each Additional Vessel  Result Date: 11/04/2021 INDICATION: 65 year old male with rectal hemorrhage status post  recent transrectal prostate biopsy and status post right inferior rectal gelfoam embolization on 11/02/21 with recurrent hemorrhage and profound anemia. He refuses blood transfusion as he is Jehovah's Witness. Currently hemodynamically stable. EXAM: 1. Ultrasound-guided vascular access of the right common femoral artery. 2. Selective catheterization angiography of the inferior mesenteric artery, superior rectal artery, and anterior divisions of the bilateral internal iliac arteries 3. Particle embolization of the superior rectal  artery. 4. Gel-Foam slurry embolization of the anterior division of the right internal iliac artery. MEDICATIONS: None. ANESTHESIA/SEDATION: Moderate (conscious) sedation was employed during this procedure. A total of Versed 2 mg and Fentanyl 100 mcg was administered intravenously. Moderate Sedation Time: 76 minutes. The patient's level of consciousness and vital signs were monitored continuously by radiology nursing throughout the procedure under my direct supervision. CONTRAST:  70mL OMNIPAQUE IOHEXOL 300 MG/ML SOLN, 48mL OMNIPAQUE IOHEXOL 300 MG/ML SOLN, 62mL OMNIPAQUE IOHEXOL 300 MG/ML SOLN FLUOROSCOPY TIME:  Fluoroscopy Time: 29 minutes 30 seconds (1,318 mGy). COMPLICATIONS: None immediate. PROCEDURE: Informed consent was obtained from the patient following explanation of the procedure, risks, benefits and alternatives. The patient understands, agrees and consents for the procedure. All questions were addressed. A time out was performed prior to the initiation of the procedure. Maximal barrier sterile technique utilized including caps, mask, sterile gowns, sterile gloves, large sterile drape, hand hygiene, and Betadine prep. The right groin was prepped and draped in standard fashion. Preprocedure ultrasound evaluation demonstrated patency of the right common femoral artery. A permanent image was captured and stored in the record. Subdermal Local anesthesia was provided at the planned  needle entry site with 1% lidocaine. A small skin nick was made. Under direct ultrasound visualization, the right common femoral artery is accessed with standard micropuncture technique. A limited right lower extremity angiogram was performed which demonstrated adequate puncture site for closure device use. A J wire was directed to the abdominal aorta exchanged for a 5 French, 11 cm vascular sheath. A Mickelson catheter was then used to select the inferior mesenteric artery ostium. Inferior mesenteric angiogram was performed which demonstrated patency of the IMA. A Cook cantata microcatheter and a synchro soft microwire were then inserted into the proximal IMA and directed to the superior rectal artery. Angiogram of the superior rectal artery was performed which demonstrated perfusion of the bilateral rectum without any evidence of active extravasation. There was a small focal focus of hyperemia about the rectum which could represent etiology of hemorrhage. With the catheter parked at the bifurcation of the proximal superior rectal artery, particle embolization was performed using 500-700 micron embospheres. Near complete stasis was achieved. The microcatheter was removed and the Mickelson catheter was redirected to select the left common iliac artery. The catheter was exchanged over Glidewire for a C2 catheter. The C2 catheter was used to select the internal iliac artery. Internal iliac angiogram was performed which demonstrated patent anterior and posterior proximal branches and an inferior rectal artery arising from the proximal anterior division. Attempts at cannulating the inferior rectal artery were made with the microcatheter and microwire, however vessel spasm occurred in the inferior rectal artery was no longer visible. The microcatheter was removed and a Waltman loop was formed to redirect the C2 catheter into the right internal iliac artery. Angiogram of the right internal iliac artery demonstrated patent  anterior posterior division with the inferior rectal artery arising from the proximal aspect of the anterior division. The microcatheter and wire used to select the anterior division of the internal iliac artery. Position was confirmed with limited angiogram. A Gel-Foam slurry was made and embolization of the anterior division of the right internal iliac artery was performed. Upon completion of embolization, there is sluggish pulsatile antegrade flow with pruning of the distal branches including the inferior rectal artery. The microcatheter was removed and the C2 catheter was redirected to the common iliac artery and removed over a J wire. A 6 French Angio-Seal was then placed  and deployed without complication or change in pulses. The patient tolerated the procedure well and remained stable throughout the procedure. The patient was transferred to the floor in stable condition. IMPRESSION: 1. Rectal hyperemia without evidence of active extravasation. 2. Prophylactic superior rectal artery particle embolization and Gelfoam embolization of the anterior division of the right common iliac artery Ruthann Cancer, MD [facility] Radiology Electronically Signed   By: Ruthann Cancer M.D.   On: 11/04/2021 13:14   IR Angiogram Selective Each Additional Vessel  Result Date: 11/04/2021 INDICATION: 65 year old male with rectal hemorrhage status post recent transrectal prostate biopsy and status post right inferior rectal gelfoam embolization on 11/02/21 with recurrent hemorrhage and profound anemia. He refuses blood transfusion as he is Jehovah's Witness. Currently hemodynamically stable. EXAM: 1. Ultrasound-guided vascular access of the right common femoral artery. 2. Selective catheterization angiography of the inferior mesenteric artery, superior rectal artery, and anterior divisions of the bilateral internal iliac arteries 3. Particle embolization of the superior rectal  artery. 4. Gel-Foam slurry embolization of the anterior division of the right internal iliac artery. MEDICATIONS: None. ANESTHESIA/SEDATION: Moderate (conscious) sedation was employed during this procedure. A total of Versed 2 mg and Fentanyl 100 mcg was administered intravenously. Moderate Sedation Time: 76 minutes. The patient's level of consciousness and vital signs were monitored continuously by radiology nursing throughout the procedure under my direct supervision. CONTRAST:  34mL OMNIPAQUE IOHEXOL 300 MG/ML SOLN, 39mL OMNIPAQUE IOHEXOL 300 MG/ML SOLN, 62mL OMNIPAQUE IOHEXOL 300 MG/ML SOLN FLUOROSCOPY TIME:  Fluoroscopy Time: 29 minutes 30 seconds (1,318 mGy). COMPLICATIONS: None immediate. PROCEDURE: Informed consent was obtained from the patient following explanation of the procedure, risks, benefits and alternatives. The patient understands, agrees and consents for the procedure. All questions were addressed. A time out was performed prior to the initiation of the procedure. Maximal barrier sterile technique utilized including caps, mask, sterile gowns, sterile gloves, large sterile drape, hand hygiene, and Betadine prep. The right groin was prepped and draped in standard fashion. Preprocedure ultrasound evaluation demonstrated patency of the right common femoral artery. A permanent image was captured and stored in the record. Subdermal Local anesthesia was provided at the planned needle entry site with 1% lidocaine. A small skin nick was made. Under direct ultrasound visualization, the right common femoral artery is accessed with standard micropuncture technique. A limited right lower extremity angiogram was performed which demonstrated adequate puncture site for closure device use. A J wire was directed to the abdominal aorta exchanged for a 5 French, 11 cm vascular sheath. A Mickelson catheter was then used to select the inferior mesenteric artery ostium. Inferior mesenteric angiogram was performed which  demonstrated patency of the IMA. A Cook cantata microcatheter and a synchro soft microwire were then inserted into the proximal IMA and directed to the superior rectal artery. Angiogram of the superior rectal artery was performed which demonstrated perfusion of the bilateral rectum without any evidence of active extravasation. There was a small focal focus of hyperemia about the rectum which could represent etiology of hemorrhage. With the catheter parked at the bifurcation of the proximal superior rectal artery, particle embolization was performed using 500-700 micron embospheres. Near complete stasis was achieved. The microcatheter was removed and the Mickelson catheter was redirected to select the left common iliac artery. The catheter was exchanged over Glidewire for a C2 catheter. The C2 catheter was used to select the internal iliac artery. Internal iliac angiogram was performed which demonstrated patent anterior and posterior  proximal branches and an inferior rectal artery arising from the proximal anterior division. Attempts at cannulating the inferior rectal artery were made with the microcatheter and microwire, however vessel spasm occurred in the inferior rectal artery was no longer visible. The microcatheter was removed and a Waltman loop was formed to redirect the C2 catheter into the right internal iliac artery. Angiogram of the right internal iliac artery demonstrated patent anterior posterior division with the inferior rectal artery arising from the proximal aspect of the anterior division. The microcatheter and wire used to select the anterior division of the internal iliac artery. Position was confirmed with limited angiogram. A Gel-Foam slurry was made and embolization of the anterior division of the right internal iliac artery was performed. Upon completion of embolization, there is sluggish pulsatile antegrade flow with pruning of the distal branches including the inferior rectal artery. The  microcatheter was removed and the C2 catheter was redirected to the common iliac artery and removed over a J wire. A 6 French Angio-Seal was then placed and deployed without complication or change in pulses. The patient tolerated the procedure well and remained stable throughout the procedure. The patient was transferred to the floor in stable condition. IMPRESSION: 1. Rectal hyperemia without evidence of active extravasation. 2. Prophylactic superior rectal artery particle embolization and Gelfoam embolization of the anterior division of the right common iliac artery Ruthann Cancer, MD [facility] Radiology Electronically Signed   By: Ruthann Cancer M.D.   On: 11/04/2021 13:14   IR US Guide Vasc Access Right  Result Date: 11/04/2021 INDICATION: 65 year old male with rectal hemorrhage status post recent transrectal prostate biopsy and status post right inferior rectal gelfoam embolization on 11/02/21 with recurrent hemorrhage and profound anemia. He refuses blood transfusion as he is Jehovah's Witness. Currently hemodynamically stable. EXAM: 1. Ultrasound-guided vascular access of the right common femoral artery. 2. Selective catheterization angiography of the inferior mesenteric artery, superior rectal artery, and anterior divisions of the bilateral internal iliac arteries 3. Particle embolization of the superior rectal artery. 4. Gel-Foam slurry embolization of the anterior division of the right internal iliac artery. MEDICATIONS: None. ANESTHESIA/SEDATION: Moderate (conscious) sedation was employed during this procedure. A total of Versed 2 mg and Fentanyl 100 mcg was administered intravenously. Moderate Sedation Time: 76 minutes. The patient's level of consciousness and vital signs were monitored continuously by radiology nursing throughout the procedure under my direct supervision. CONTRAST:  53mL OMNIPAQUE IOHEXOL 300 MG/ML SOLN, 91mL OMNIPAQUE IOHEXOL 300  MG/ML SOLN, 51mL OMNIPAQUE IOHEXOL 300 MG/ML SOLN FLUOROSCOPY TIME:  Fluoroscopy Time: 29 minutes 30 seconds (1,318 mGy). COMPLICATIONS: None immediate. PROCEDURE: Informed consent was obtained from the patient following explanation of the procedure, risks, benefits and alternatives. The patient understands, agrees and consents for the procedure. All questions were addressed. A time out was performed prior to the initiation of the procedure. Maximal barrier sterile technique utilized including caps, mask, sterile gowns, sterile gloves, large sterile drape, hand hygiene, and Betadine prep. The right groin was prepped and draped in standard fashion. Preprocedure ultrasound evaluation demonstrated patency of the right common femoral artery. A permanent image was captured and stored in the record. Subdermal Local anesthesia was provided at the planned needle entry site with 1% lidocaine. A small skin nick was made. Under direct ultrasound visualization, the right common femoral artery is accessed with standard micropuncture technique. A limited right lower extremity angiogram was performed which demonstrated adequate puncture site for closure device use. A J  wire was directed to the abdominal aorta exchanged for a 5 French, 11 cm vascular sheath. A Mickelson catheter was then used to select the inferior mesenteric artery ostium. Inferior mesenteric angiogram was performed which demonstrated patency of the IMA. A Cook cantata microcatheter and a synchro soft microwire were then inserted into the proximal IMA and directed to the superior rectal artery. Angiogram of the superior rectal artery was performed which demonstrated perfusion of the bilateral rectum without any evidence of active extravasation. There was a small focal focus of hyperemia about the rectum which could represent etiology of hemorrhage. With the catheter parked at the bifurcation of the proximal superior rectal artery, particle embolization was  performed using 500-700 micron embospheres. Near complete stasis was achieved. The microcatheter was removed and the Mickelson catheter was redirected to select the left common iliac artery. The catheter was exchanged over Glidewire for a C2 catheter. The C2 catheter was used to select the internal iliac artery. Internal iliac angiogram was performed which demonstrated patent anterior and posterior proximal branches and an inferior rectal artery arising from the proximal anterior division. Attempts at cannulating the inferior rectal artery were made with the microcatheter and microwire, however vessel spasm occurred in the inferior rectal artery was no longer visible. The microcatheter was removed and a Waltman loop was formed to redirect the C2 catheter into the right internal iliac artery. Angiogram of the right internal iliac artery demonstrated patent anterior posterior division with the inferior rectal artery arising from the proximal aspect of the anterior division. The microcatheter and wire used to select the anterior division of the internal iliac artery. Position was confirmed with limited angiogram. A Gel-Foam slurry was made and embolization of the anterior division of the right internal iliac artery was performed. Upon completion of embolization, there is sluggish pulsatile antegrade flow with pruning of the distal branches including the inferior rectal artery. The microcatheter was removed and the C2 catheter was redirected to the common iliac artery and removed over a J wire. A 6 French Angio-Seal was then placed and deployed without complication or change in pulses. The patient tolerated the procedure well and remained stable throughout the procedure. The patient was transferred to the floor in stable condition. IMPRESSION: 1. Rectal hyperemia without evidence of active extravasation. 2. Prophylactic superior rectal artery particle embolization and Gelfoam embolization of the anterior division of the  right common iliac artery Ruthann Cancer, MD [facility] Radiology Electronically Signed   By: Ruthann Cancer M.D.   On: 11/04/2021 13:14   ECHOCARDIOGRAM COMPLETE  Result Date: 11/03/2021    ECHOCARDIOGRAM REPORT   Patient Name:   TREMAINE EARWOOD Date of Exam: 11/03/2021 Medical Rec #:  [id number]       Height:       74.0 in Accession #:    [id number]      Weight:       250.0 lb Date of Birth:  [birth year]       BSA:          2.390 m Patient Age:    55 years        BP:           118/73 mmHg Patient Gender: M               HR:           82 bpm. Exam Location:  Inpatient Procedure: 2D Echo, Color Doppler and Cardiac Doppler Indications:    Chest  pain  History:        Patient has prior history of Echocardiogram examinations. Risk                 Factors:Hypertension, Diabetes and Sleep Apnea.  Sonographer:    Jyl Heinz Referring Phys: [id number] [place]  1. Left ventricular ejection fraction, by estimation, is 65 to 70%. The left ventricle has normal function. The left ventricle has no regional wall motion abnormalities. There is mild concentric left ventricular hypertrophy. Left ventricular diastolic parameters are consistent with Grade I diastolic dysfunction (impaired relaxation).  2. Right ventricular systolic function is normal. The right ventricular size is normal. Tricuspid regurgitation signal is inadequate for assessing PA pressure.  3. The mitral valve is grossly normal. No evidence of mitral valve regurgitation. No evidence of mitral stenosis.  4. The aortic valve is tricuspid. Aortic valve regurgitation is not visualized. No aortic stenosis is present.  5. Aortic dilatation noted. There is mild dilatation of the aortic root, measuring 40 mm. Conclusion(s)/Recommendation(s): Normal biventricular function without evidence of hemodynamically significant valvular heart disease. FINDINGS  Left Ventricle: Left ventricular ejection fraction, by  estimation, is 65 to 70%. The left ventricle has normal function. The left ventricle has no regional wall motion abnormalities. The left ventricular internal cavity size was normal in size. There is  mild concentric left ventricular hypertrophy. Left ventricular diastolic parameters are consistent with Grade I diastolic dysfunction (impaired relaxation). Right Ventricle: The right ventricular size is normal. No increase in right ventricular wall thickness. Right ventricular systolic function is normal. Tricuspid regurgitation signal is inadequate for assessing PA pressure. Left Atrium: Left atrial size was normal in size. Right Atrium: Right atrial size was normal in size. Pericardium: There is no evidence of pericardial effusion. Mitral Valve: The mitral valve is grossly normal. Mild mitral annular calcification. No evidence of mitral valve regurgitation. No evidence of mitral valve stenosis. Tricuspid Valve: The tricuspid valve is grossly normal. Tricuspid valve regurgitation is trivial. No evidence of tricuspid stenosis. Aortic Valve: The aortic valve is tricuspid. Aortic valve regurgitation is not visualized. No aortic stenosis is present. Aortic valve mean gradient measures 10.0 mmHg. Aortic valve peak gradient measures 17.5 mmHg. Aortic valve area, by VTI measures 4.49 cm. Pulmonic Valve: The pulmonic valve was grossly normal. Pulmonic valve regurgitation is not visualized. No evidence of pulmonic stenosis. Aorta: Aortic dilatation noted. There is mild dilatation of the aortic root, measuring 40 mm. Venous: The inferior vena cava was not well visualized. IAS/Shunts: The atrial septum is grossly normal.  LEFT VENTRICLE PLAX 2D LVIDd:         4.10 cm     Diastology LVIDs:         2.50 cm     LV e' medial:    8.55 cm/s LV PW:         1.30 cm     LV E/e' medial:  10.2 LV IVS:        1.30 cm     LV e' lateral:   8.38 cm/s LVOT diam:     2.40 cm     LV E/e' lateral: 10.4 LV SV:         159 LV SV Index:   67 LVOT  Area:     4.52 cm  LV Volumes (MOD) LV vol d, MOD A2C: 97.2 ml LV vol d, MOD A4C: 92.8 ml LV vol s, MOD A2C: 32.5 ml LV vol s, MOD A4C: 33.3 ml LV SV  MOD A2C:     64.7 ml LV SV MOD A4C:     92.8 ml LV SV MOD BP:      63.0 ml RIGHT VENTRICLE RV Basal diam:  2.90 cm RV Mid diam:    2.60 cm RV S prime:     12.60 cm/s TAPSE (M-mode): 2.5 cm LEFT ATRIUM             Index        RIGHT ATRIUM           Index LA diam:        3.20 cm 1.34 cm/m   RA Area:     18.70 cm LA Vol (A2C):   47.4 ml 19.84 ml/m  RA Volume:   54.50 ml  22.81 ml/m LA Vol (A4C):   36.8 ml 15.40 ml/m LA Biplane Vol: 43.5 ml 18.20 ml/m  AORTIC VALVE AV Area (Vmax):    3.96 cm AV Area (Vmean):   4.21 cm AV Area (VTI):     4.49 cm AV Vmax:           209.00 cm/s AV Vmean:          152.500 cm/s AV VTI:            0.355 m AV Peak Grad:      17.5 mmHg AV Mean Grad:      10.0 mmHg LVOT Vmax:         183.00 cm/s LVOT Vmean:        142.000 cm/s LVOT VTI:          0.352 m LVOT/AV VTI ratio: 0.99  AORTA Ao Root diam: 4.00 cm Ao Asc diam:  3.40 cm MITRAL VALVE MV Area (PHT): 3.36 cm     SHUNTS MV Decel Time: 226 msec     Systemic VTI:  0.35 m MV E velocity: 86.90 cm/s   Systemic Diam: 2.40 cm MV A velocity: 128.00 cm/s MV E/A ratio:  0.68 Eleonore Chiquito MD Electronically signed by Eleonore Chiquito MD Signature Date/Time: 11/03/2021/3:38:33 PM    Final    IR EMBO ART  VEN HEMORR LYMPH EXTRAV  INC GUIDE ROADMAPPING  Result Date: 11/04/2021 INDICATION: 65 year old male with rectal hemorrhage status post recent transrectal prostate biopsy and status post right inferior rectal gelfoam embolization on 11/02/21 with recurrent hemorrhage and profound anemia. He refuses blood transfusion as he is Jehovah's Witness. Currently hemodynamically stable. EXAM: 1. Ultrasound-guided vascular access of the right common femoral artery. 2. Selective catheterization angiography of the inferior mesenteric artery, superior rectal artery, and anterior divisions of the bilateral  internal iliac arteries 3. Particle embolization of the superior rectal artery. 4. Gel-Foam slurry embolization of the anterior division of the right internal iliac artery. MEDICATIONS: None. ANESTHESIA/SEDATION: Moderate (conscious) sedation was employed during this procedure. A total of Versed 2 mg and Fentanyl 100 mcg was administered intravenously. Moderate Sedation Time: 76 minutes. The patient's level of consciousness and vital signs were monitored continuously by radiology nursing throughout the procedure under my direct supervision. CONTRAST:  23mL OMNIPAQUE IOHEXOL 300 MG/ML SOLN, 28mL OMNIPAQUE IOHEXOL 300 MG/ML SOLN, 2mL OMNIPAQUE IOHEXOL 300 MG/ML SOLN FLUOROSCOPY TIME:  Fluoroscopy Time: 29 minutes 30 seconds (1,318 mGy). COMPLICATIONS: None immediate. PROCEDURE: Informed consent was obtained from the patient following explanation of the procedure, risks, benefits and alternatives. The patient understands, agrees and consents for the procedure. All questions were addressed. A time out was performed prior to the initiation of the procedure. Maximal  barrier sterile technique utilized including caps, mask, sterile gowns, sterile gloves, large sterile drape, hand hygiene, and Betadine prep. The right groin was prepped and draped in standard fashion. Preprocedure ultrasound evaluation demonstrated patency of the right common femoral artery. A permanent image was captured and stored in the record. Subdermal Local anesthesia was provided at the planned needle entry site with 1% lidocaine. A small skin nick was made. Under direct ultrasound visualization, the right common femoral artery is accessed with standard micropuncture technique. A limited right lower extremity angiogram was performed which demonstrated adequate puncture site for closure device use. A J wire was directed to the abdominal aorta exchanged for a 5 French, 11 cm vascular sheath. A Mickelson catheter was then used to select the inferior  mesenteric artery ostium. Inferior mesenteric angiogram was performed which demonstrated patency of the IMA. A Cook cantata microcatheter and a synchro soft microwire were then inserted into the proximal IMA and directed to the superior rectal artery. Angiogram of the superior rectal artery was performed which demonstrated perfusion of the bilateral rectum without any evidence of active extravasation. There was a small focal focus of hyperemia about the rectum which could represent etiology of hemorrhage. With the catheter parked at the bifurcation of the proximal superior rectal artery, particle embolization was performed using 500-700 micron embospheres. Near complete stasis was achieved. The microcatheter was removed and the Mickelson catheter was redirected to select the left common iliac artery. The catheter was exchanged over Glidewire for a C2 catheter. The C2 catheter was used to select the internal iliac artery. Internal iliac angiogram was performed which demonstrated patent anterior and posterior proximal branches and an inferior rectal artery arising from the proximal anterior division. Attempts at cannulating the inferior rectal artery were made with the microcatheter and microwire, however vessel spasm occurred in the inferior rectal artery was no longer visible. The microcatheter was removed and a Waltman loop was formed to redirect the C2 catheter into the right internal iliac artery. Angiogram of the right internal iliac artery demonstrated patent anterior posterior division with the inferior rectal artery arising from the proximal aspect of the anterior division. The microcatheter and wire used to select the anterior division of the internal iliac artery. Position was confirmed with limited angiogram. A Gel-Foam slurry was made and embolization of the anterior division of the right internal iliac artery was performed. Upon completion of embolization, there is sluggish pulsatile antegrade flow with  pruning of the distal branches including the inferior rectal artery. The microcatheter was removed and the C2 catheter was redirected to the common iliac artery and removed over a J wire. A 6 French Angio-Seal was then placed and deployed without complication or change in pulses. The patient tolerated the procedure well and remained stable throughout the procedure. The patient was transferred to the floor in stable condition. IMPRESSION: 1. Rectal hyperemia without evidence of active extravasation. 2. Prophylactic superior rectal artery particle embolization and Gelfoam embolization of the anterior division of the right common iliac artery Ruthann Cancer, MD [facility] Radiology Electronically Signed   By: Ruthann Cancer M.D.   On: 11/04/2021 13:14   IR EMBO ART  VEN HEMORR LYMPH EXTRAV  INC GUIDE ROADMAPPING  Result Date: 11/04/2021 INDICATION: 65 year old male with rectal hemorrhage status post recent transrectal prostate biopsy and status post right inferior rectal gelfoam embolization on 11/02/21 with recurrent hemorrhage and profound anemia. He refuses blood transfusion as he is Jehovah's Witness. Currently hemodynamically  stable. EXAM: 1. Ultrasound-guided vascular access of the right common femoral artery. 2. Selective catheterization angiography of the inferior mesenteric artery, superior rectal artery, and anterior divisions of the bilateral internal iliac arteries 3. Particle embolization of the superior rectal artery. 4. Gel-Foam slurry embolization of the anterior division of the right internal iliac artery. MEDICATIONS: None. ANESTHESIA/SEDATION: Moderate (conscious) sedation was employed during this procedure. A total of Versed 2 mg and Fentanyl 100 mcg was administered intravenously. Moderate Sedation Time: 76 minutes. The patient's level of consciousness and vital signs were monitored continuously by radiology nursing throughout the procedure under  my direct supervision. CONTRAST:  81mL OMNIPAQUE IOHEXOL 300 MG/ML SOLN, 51mL OMNIPAQUE IOHEXOL 300 MG/ML SOLN, 30mL OMNIPAQUE IOHEXOL 300 MG/ML SOLN FLUOROSCOPY TIME:  Fluoroscopy Time: 29 minutes 30 seconds (1,318 mGy). COMPLICATIONS: None immediate. PROCEDURE: Informed consent was obtained from the patient following explanation of the procedure, risks, benefits and alternatives. The patient understands, agrees and consents for the procedure. All questions were addressed. A time out was performed prior to the initiation of the procedure. Maximal barrier sterile technique utilized including caps, mask, sterile gowns, sterile gloves, large sterile drape, hand hygiene, and Betadine prep. The right groin was prepped and draped in standard fashion. Preprocedure ultrasound evaluation demonstrated patency of the right common femoral artery. A permanent image was captured and stored in the record. Subdermal Local anesthesia was provided at the planned needle entry site with 1% lidocaine. A small skin nick was made. Under direct ultrasound visualization, the right common femoral artery is accessed with standard micropuncture technique. A limited right lower extremity angiogram was performed which demonstrated adequate puncture site for closure device use. A J wire was directed to the abdominal aorta exchanged for a 5 French, 11 cm vascular sheath. A Mickelson catheter was then used to select the inferior mesenteric artery ostium. Inferior mesenteric angiogram was performed which demonstrated patency of the IMA. A Cook cantata microcatheter and a synchro soft microwire were then inserted into the proximal IMA and directed to the superior rectal artery. Angiogram of the superior rectal artery was performed which demonstrated perfusion of the bilateral rectum without any evidence of active extravasation. There was a small focal focus of hyperemia about the rectum which could represent etiology of hemorrhage. With the catheter  parked at the bifurcation of the proximal superior rectal artery, particle embolization was performed using 500-700 micron embospheres. Near complete stasis was achieved. The microcatheter was removed and the Mickelson catheter was redirected to select the left common iliac artery. The catheter was exchanged over Glidewire for a C2 catheter. The C2 catheter was used to select the internal iliac artery. Internal iliac angiogram was performed which demonstrated patent anterior and posterior proximal branches and an inferior rectal artery arising from the proximal anterior division. Attempts at cannulating the inferior rectal artery were made with the microcatheter and microwire, however vessel spasm occurred in the inferior rectal artery was no longer visible. The microcatheter was removed and a Waltman loop was formed to redirect the C2 catheter into the right internal iliac artery. Angiogram of the right internal iliac artery demonstrated patent anterior posterior division with the inferior rectal artery arising from the proximal aspect of the anterior division. The microcatheter and wire used to select the anterior division of the internal iliac artery. Position was confirmed with limited angiogram. A Gel-Foam slurry was made and embolization of the anterior division of the right internal iliac artery was performed. Upon completion of embolization, there is sluggish pulsatile antegrade  flow with pruning of the distal branches including the inferior rectal artery. The microcatheter was removed and the C2 catheter was redirected to the common iliac artery and removed over a J wire. A 6 French Angio-Seal was then placed and deployed without complication or change in pulses. The patient tolerated the procedure well and remained stable throughout the procedure. The patient was transferred to the floor in stable condition. IMPRESSION: 1. Rectal hyperemia without evidence of active extravasation. 2. Prophylactic superior  rectal artery particle embolization and Gelfoam embolization of the anterior division of the right common iliac artery Ruthann Cancer, MD [facility] Radiology Electronically Signed   By: Ruthann Cancer M.D.   On: 11/04/2021 13:14    Assessment / Plan:  65) 65 year old male admitted to the hospital 11/02/2021 with an acute lower GI hemorrhage likely from recent transrectal prostate biopsy with profound anemia while on Coumadin with Lovenox bridge. Admission INR 1.5 -> received Vitamin K. Admission Hg 11.5 -> 9.5 -> 6.7  -> 4.9 on 11/29.  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 Abd/pelvic CTA was + for acute lower rectal GI bleeding. S/P coil embolization of the right inferior rectal artery 11/28  by IR. He had recurrent active lower GI bleeding and underwent an urgent flex sig by Dr. Loletha Carrow on 12/29 which showed blood in the rectum and sigmoid, the area was lavaged without identifying the source of bleeding. IR was reconsulted and he underwent a prophylactic protocol embolization of the proximal superior rectal artery and prophylactic gelfoam slurry embolization of the anterior division of the right internal iliac artery was completed. Hg 4.9 -> today Hg 6.7.  Received IV iron 11/29. No further hematochezia since 2nd IR intervention.  He remains hemodynamically stable. -Clear liquid diet, if no active bleeding then may advance to full liquid diet later today -Continue to monitor the patient for active GI bleeding -H/H Q 8 hrs next 24 hrs -Continue pantoprazole 40 mg IV twice daily for now -Further recommendations per Dr. Henrene Pastor   2) Prostate cancer, recently diagnosed s/p prostate biopsy 111/21/2022 -Urology following with recommendations to initiate Epogen  -Continue follow-up with oncologist Dr.Feng  Principal Problem:   GIB (gastrointestinal bleeding) Active Problems:   Hyperlipidemia   Depression   Essential  hypertension   Pulmonary embolism (HCC)   Gross hematuria   Acute blood loss anemia   Esophageal reflux   History of pulmonary embolus (PE)   Diabetes mellitus type 2 in obese (HCC)   Chest pain   Hematochezia   Abnormal CT of the abdomen   Refusal of blood transfusions as patient is Jehovah's Witness     LOS: 2 days   Noralyn Pick  11/05/2021, 09:26AM  GI ATTENDING  Interval history data reviewed.  Agree with interval progress note as outlined above.  Fortunately, he has stopped bleeding.  Hopefully will not rebleed.  Continue to monitor.  No additional recommendations in addition to those as outlined above.  Agree with advancing diet.  May go home tomorrow if not having any difficulties.  Docia Chuck. Geri Seminole., M.D. [facility] Division of Gastroenterology

## 2021-11-05 NOTE — Progress Notes (Signed)
PROGRESS NOTE    [name]  RUE:[id number] DOB: [birth year] DOA: 11/02/2021 PCP: Biagio Borg, MD   Chief Complaint  Patient presents with   Rectal Bleeding    Brief Narrative:  66 year old gentleman(Jehovah's Witness), prior history of hypertension, hyperlipidemia, pulmonary embolism on Coumadin and Lovenox, type 2 diabetes, prostate cancer, GERD, recently underwent transrectal ultrasound-guided prostate biopsy on 10/26/2021 secondary to elevated PSA presents to ED with significant bright red blood per rectum, some lightheadedness and dizziness.  Patient denies any NSAID use.   Patient underwent reversal with vitamin K in the ER embolization on 11/02/2021.  Patient continues to have bright red blood per rectum.  GI was consulted.  IR consulted and patient underwent mesenteric and pelvic angiogram with particle embolization of superior rectal artery with Gelfoam embolization of anterior division of the right internal iliac artery.  Urology, oncology, gastroenterology, general surgery, IR on board and appreciate recommendations. Patient seen with family at bedside, denies any chest pain, shortness of breath, nausea vomiting or abdominal pain at this time.  No new rectal bleeding at this time.     Assessment & Plan:   Principal Problem:   GIB (gastrointestinal bleeding) Active Problems:   Hyperlipidemia   Depression   Essential hypertension   Pulmonary embolism (HCC)   Gross hematuria   Acute blood loss anemia   Esophageal reflux   History of pulmonary embolus (PE)   Diabetes mellitus type 2 in obese (HCC)   Chest pain   Hematochezia   Abnormal CT of the abdomen   Refusal of blood transfusions as patient is Jehovah's Witness  Acute GI bleed/acute blood loss anemia Probably secondary to lower rectal bleed as noted on the CT angiogram of the abdomen and pelvis. Hemoglobin around 11.5 on admission dropped to 4.9, improved to 6.7 today.   Patient is s/p mesenteric and  pelvic angiogram with particle embolization of superior rectal artery and Gelfoam embolization of the anterior addition of the right internal iliac artery. Patient is Jehovah's Witness and will not receive any blood products at this time. Hematology on board and recommending supportive care with IV iron, folic acid and J47 supplements. Hematology also recommending discontinuation of anticoagulation with Coumadin as patient's provoked PE/DVT was almost 15 years ago, without any recurrent clots.  He also received a dose of Epogen on 11/04/21. No rectal bleeding overnight.  Continue to monitor and transfer the patient to progressive care.   History of bilateral PE Patient noted to have provoked PE/DVT in 2007 and was started on anticoagulation. In light of the ongoing lower GI bleed, hematology recommending discontinuation of anticoagulation with Coumadin. Patient is agreeable to the plan.    New diagnosis of intermediate risk prostate cancer Urology on board and recommend outpatient follow-up. Pain control.     Chest pain Appears to have resolved. 2D echocardiogram showed left ventricular ejection fraction of 70% with no regional wall motion abnormalities, grade 1 diastolic dysfunction present.     Type 2 diabetes mellitus Hemoglobin A1c around 7.2 in October 2022 Continue with sliding scale insulin. CBG (last 3)  Recent Labs    11/04/21 1756 11/04/21 2253 11/05/21 0602  GLUCAP 150* 132* 120*       Essential hypertension Blood pressure parameters are appear to be optimal. No changes.     Hyperlipidemia Continue with statin   Depression Patient was on Lexapro which is being held for now.   GERD Continue with PPI     DVT prophylaxis: (SCDs Code Status: Full  code Family Communication: Family at bedside Disposition:   Status is: Inpatient  Remains inpatient appropriate because: Severe anemia work-up       Consultants:  Gastroenterology General  surgery IR Dr. Serafina Royals  Procedures: IR EMBOLIZATION.   Antimicrobials: none.    Subjective: No chest pain or sob, no nausea, vomiting .   Objective: Vitals:   11/04/21 1615 11/04/21 2015 11/04/21 2258 11/05/21 0429  BP: 131/62 110/63 (!) 106/57 (!) 103/58  Pulse: 69 70 64 65  Resp: 18 18 18 18   Temp: 100.1 F (37.8 C) 99 F (37.2 C) 98.5 F (36.9 C) 98.7 F (37.1 C)  TempSrc: Oral Oral Oral Oral  SpO2: 100% 99% 96% 98%  Weight:      Height:        Intake/Output Summary (Last 24 hours) at 11/05/2021 1120 Last data filed at 11/05/2021 0500 Gross per 24 hour  Intake 2083.64 ml  Output 2455 ml  Net -371.36 ml    Filed Weights   11/02/21 0247 11/03/21 1917 11/04/21 0453  Weight: 113.4 kg 113.4 kg 122 kg    Examination:  General exam: Appears calm and comfortable  Respiratory system: Clear to auscultation. Respiratory effort normal. Cardiovascular system: S1 & S2 heard, RRR. No JVD,  No pedal edema. Gastrointestinal system: Abdomen is nondistended, soft and nontender.  Normal bowel sounds heard. Central nervous system: Alert and oriented. No focal neurological deficits. Extremities: Symmetric 5 x 5 power. Skin: No rashes, lesions or ulcers Psychiatry: Mood & affect appropriate.      Data Reviewed: I have personally reviewed following labs and imaging studies  CBC: Recent Labs  Lab 11/02/21 0340 11/02/21 1224 11/03/21 0455 11/03/21 1142 11/03/21 2137 11/04/21 0501 11/04/21 1315 11/04/21 1843 11/05/21 0050  WBC 6.0  --  7.8  --   --   --   --   --   --   NEUTROABS 2.9  --   --   --   --   --   --   --   --   HGB 11.5*   < > 6.8*   < > 5.2* 4.9* 5.4* 4.9* 6.7*  HCT 37.7*   < > 22.2*   < > 16.9* 15.5* 17.1* 15.5* 21.6*  MCV 72.2*  --  73.3*  --   --   --   --   --   --   PLT 191  --  146*  --   --   --   --   --   --    < > = values in this interval not displayed.     Basic Metabolic Panel: Recent Labs  Lab 11/02/21 0340 11/03/21 0455  11/04/21 0501  NA 139 134* 135  K 4.9 4.5 4.0  CL 105 107 110  CO2 25 21* 24  GLUCOSE 101* 168* 117*  BUN 31* 36* 25*  CREATININE 1.29* 1.67* 1.26*  CALCIUM 8.9 8.1* 7.6*     GFR: Estimated Creatinine Clearance: 78.9 mL/min (A) (by C-G formula based on SCr of 1.26 mg/dL (H)).  Liver Function Tests: Recent Labs  Lab 11/02/21 0340 11/03/21 0455  AST 43* 28  ALT 34 27  ALKPHOS 48 37*  BILITOT 0.5 0.5  PROT 6.8 4.9*  ALBUMIN 4.0 3.1*     CBG: Recent Labs  Lab 11/04/21 0455 11/04/21 1205 11/04/21 1756 11/04/21 2253 11/05/21 0602  GLUCAP 117* 127* 150* 132* 120*      Recent Results (from the past 240 hour(s))  Resp Panel by RT-PCR (Flu A&B, Covid) Nasopharyngeal Swab     Status: None   Collection Time: 11/02/21  7:06 AM   Specimen: Nasopharyngeal Swab; Nasopharyngeal(NP) swabs in vial transport medium  Result Value Ref Range Status   SARS Coronavirus 2 by RT PCR NEGATIVE NEGATIVE Final    Comment: (NOTE) SARS-CoV-2 target nucleic acids are NOT DETECTED.  The SARS-CoV-2 RNA is generally detectable in upper respiratory specimens during the acute phase of infection. The lowest concentration of SARS-CoV-2 viral copies this assay can detect is 138 copies/mL. A negative result does not preclude SARS-Cov-2 infection and should not be used as the sole basis for treatment or other patient management decisions. A negative result may occur with  improper specimen collection/handling, submission of specimen other than nasopharyngeal swab, presence of viral mutation(s) within the areas targeted by this assay, and inadequate number of viral copies(<138 copies/mL). A negative result must be combined with clinical observations, patient history, and epidemiological information. The expected result is Negative.  Fact Sheet for Patients:  EntrepreneurPulse.com.au  Fact Sheet for Healthcare Providers:  IncredibleEmployment.be  This test  is no t yet approved or cleared by the [place] FDA and  has been authorized for detection and/or diagnosis of SARS-CoV-2 by FDA under an Emergency Use Authorization (EUA). This EUA will remain  in effect (meaning this test can be used) for the duration of the COVID-19 declaration under Section 564(b)(1) of the Act, 21 U.S.C.section 360bbb-3(b)(1), unless the authorization is terminated  or revoked sooner.       Influenza A by PCR NEGATIVE NEGATIVE Final   Influenza B by PCR NEGATIVE NEGATIVE Final    Comment: (NOTE) The Xpert Xpress SARS-CoV-2/FLU/RSV plus assay is intended as an aid in the diagnosis of influenza from Nasopharyngeal swab specimens and should not be used as a sole basis for treatment. Nasal washings and aspirates are unacceptable for Xpert Xpress SARS-CoV-2/FLU/RSV testing.  Fact Sheet for Patients: EntrepreneurPulse.com.au  Fact Sheet for Healthcare Providers: IncredibleEmployment.be  This test is not yet approved or cleared by the [place] FDA and has been authorized for detection and/or diagnosis of SARS-CoV-2 by FDA under an Emergency Use Authorization (EUA). This EUA will remain in effect (meaning this test can be used) for the duration of the COVID-19 declaration under Section 564(b)(1) of the Act, 21 U.S.C. section 360bbb-3(b)(1), unless the authorization is terminated or revoked.  Performed at [facility], [place] [address] [place], [place] [address]   MRSA Next Gen by PCR, Nasal     Status: None   Collection Time: 11/02/21 12:53 PM   Specimen: Nasal Mucosa; Nasal Swab  Result Value Ref Range Status   MRSA by PCR Next Gen NOT DETECTED NOT DETECTED Final    Comment: (NOTE) The GeneXpert MRSA Assay (FDA approved for NASAL specimens only), is one component of a comprehensive MRSA colonization surveillance program. It is not intended to diagnose MRSA infection nor to guide or monitor  treatment for MRSA infections. Test performance is not FDA approved in patients less than 76 years old. Performed at [facility], [place] [address] [place], [place] [address]           Radiology Studies: IR Angiogram Visceral Selective  Result Date: 11/04/2021 INDICATION: 65 year old male with rectal hemorrhage status post recent transrectal prostate biopsy and status post right inferior rectal gelfoam embolization on 11/02/21 with recurrent hemorrhage and profound anemia. He refuses blood transfusion as he is Jehovah's Witness. Currently hemodynamically  stable. EXAM: 1. Ultrasound-guided vascular access of the right common femoral artery. 2. Selective catheterization angiography of the inferior mesenteric artery, superior rectal artery, and anterior divisions of the bilateral internal iliac arteries 3. Particle embolization of the superior rectal artery. 4. Gel-Foam slurry embolization of the anterior division of the right internal iliac artery. MEDICATIONS: None. ANESTHESIA/SEDATION: Moderate (conscious) sedation was employed during this procedure. A total of Versed 2 mg and Fentanyl 100 mcg was administered intravenously. Moderate Sedation Time: 76 minutes. The patient's level of consciousness and vital signs were monitored continuously by radiology nursing throughout the procedure under my direct supervision. CONTRAST:  89mL OMNIPAQUE IOHEXOL 300 MG/ML SOLN, 8mL OMNIPAQUE IOHEXOL 300 MG/ML SOLN, 79mL OMNIPAQUE IOHEXOL 300 MG/ML SOLN FLUOROSCOPY TIME:  Fluoroscopy Time: 29 minutes 30 seconds (1,318 mGy). COMPLICATIONS: None immediate. PROCEDURE: Informed consent was obtained from the patient following explanation of the procedure, risks, benefits and alternatives. The patient understands, agrees and consents for the procedure. All questions were addressed. A time out was performed prior to the initiation of the procedure. Maximal barrier sterile technique utilized including  caps, mask, sterile gowns, sterile gloves, large sterile drape, hand hygiene, and Betadine prep. The right groin was prepped and draped in standard fashion. Preprocedure ultrasound evaluation demonstrated patency of the right common femoral artery. A permanent image was captured and stored in the record. Subdermal Local anesthesia was provided at the planned needle entry site with 1% lidocaine. A small skin nick was made. Under direct ultrasound visualization, the right common femoral artery is accessed with standard micropuncture technique. A limited right lower extremity angiogram was performed which demonstrated adequate puncture site for closure device use. A J wire was directed to the abdominal aorta exchanged for a 5 French, 11 cm vascular sheath. A Mickelson catheter was then used to select the inferior mesenteric artery ostium. Inferior mesenteric angiogram was performed which demonstrated patency of the IMA. A Cook cantata microcatheter and a synchro soft microwire were then inserted into the proximal IMA and directed to the superior rectal artery. Angiogram of the superior rectal artery was performed which demonstrated perfusion of the bilateral rectum without any evidence of active extravasation. There was a small focal focus of hyperemia about the rectum which could represent etiology of hemorrhage. With the catheter parked at the bifurcation of the proximal superior rectal artery, particle embolization was performed using 500-700 micron embospheres. Near complete stasis was achieved. The microcatheter was removed and the Mickelson catheter was redirected to select the left common iliac artery. The catheter was exchanged over Glidewire for a C2 catheter. The C2 catheter was used to select the internal iliac artery. Internal iliac angiogram was performed which demonstrated patent anterior and posterior proximal branches and an inferior rectal artery arising from the proximal anterior division. Attempts at  cannulating the inferior rectal artery were made with the microcatheter and microwire, however vessel spasm occurred in the inferior rectal artery was no longer visible. The microcatheter was removed and a Waltman loop was formed to redirect the C2 catheter into the right internal iliac artery. Angiogram of the right internal iliac artery demonstrated patent anterior posterior division with the inferior rectal artery arising from the proximal aspect of the anterior division. The microcatheter and wire used to select the anterior division of the internal iliac artery. Position was confirmed with limited angiogram. A Gel-Foam slurry was made and embolization of the anterior division of the right internal iliac artery was performed. Upon completion of embolization, there is sluggish pulsatile antegrade  flow with pruning of the distal branches including the inferior rectal artery. The microcatheter was removed and the C2 catheter was redirected to the common iliac artery and removed over a J wire. A 6 French Angio-Seal was then placed and deployed without complication or change in pulses. The patient tolerated the procedure well and remained stable throughout the procedure. The patient was transferred to the floor in stable condition. IMPRESSION: 1. Rectal hyperemia without evidence of active extravasation. 2. Prophylactic superior rectal artery particle embolization and Gelfoam embolization of the anterior division of the right common iliac artery Ruthann Cancer, MD [facility] Radiology Electronically Signed   By: Ruthann Cancer M.D.   On: 11/04/2021 13:14   IR Angiogram Pelvis Selective Or Supraselective  Result Date: 11/04/2021 INDICATION: 65 year old male with rectal hemorrhage status post recent transrectal prostate biopsy and status post right inferior rectal gelfoam embolization on 11/02/21 with recurrent hemorrhage and profound anemia. He refuses blood  transfusion as he is Jehovah's Witness. Currently hemodynamically stable. EXAM: 1. Ultrasound-guided vascular access of the right common femoral artery. 2. Selective catheterization angiography of the inferior mesenteric artery, superior rectal artery, and anterior divisions of the bilateral internal iliac arteries 3. Particle embolization of the superior rectal artery. 4. Gel-Foam slurry embolization of the anterior division of the right internal iliac artery. MEDICATIONS: None. ANESTHESIA/SEDATION: Moderate (conscious) sedation was employed during this procedure. A total of Versed 2 mg and Fentanyl 100 mcg was administered intravenously. Moderate Sedation Time: 76 minutes. The patient's level of consciousness and vital signs were monitored continuously by radiology nursing throughout the procedure under my direct supervision. CONTRAST:  28mL OMNIPAQUE IOHEXOL 300 MG/ML SOLN, 51mL OMNIPAQUE IOHEXOL 300 MG/ML SOLN, 27mL OMNIPAQUE IOHEXOL 300 MG/ML SOLN FLUOROSCOPY TIME:  Fluoroscopy Time: 29 minutes 30 seconds (1,318 mGy). COMPLICATIONS: None immediate. PROCEDURE: Informed consent was obtained from the patient following explanation of the procedure, risks, benefits and alternatives. The patient understands, agrees and consents for the procedure. All questions were addressed. A time out was performed prior to the initiation of the procedure. Maximal barrier sterile technique utilized including caps, mask, sterile gowns, sterile gloves, large sterile drape, hand hygiene, and Betadine prep. The right groin was prepped and draped in standard fashion. Preprocedure ultrasound evaluation demonstrated patency of the right common femoral artery. A permanent image was captured and stored in the record. Subdermal Local anesthesia was provided at the planned needle entry site with 1% lidocaine. A small skin nick was made. Under direct ultrasound visualization, the right common femoral artery is accessed with standard  micropuncture technique. A limited right lower extremity angiogram was performed which demonstrated adequate puncture site for closure device use. A J wire was directed to the abdominal aorta exchanged for a 5 French, 11 cm vascular sheath. A Mickelson catheter was then used to select the inferior mesenteric artery ostium. Inferior mesenteric angiogram was performed which demonstrated patency of the IMA. A Cook cantata microcatheter and a synchro soft microwire were then inserted into the proximal IMA and directed to the superior rectal artery. Angiogram of the superior rectal artery was performed which demonstrated perfusion of the bilateral rectum without any evidence of active extravasation. There was a small focal focus of hyperemia about the rectum which could represent etiology of hemorrhage. With the catheter parked at the bifurcation of the proximal superior rectal artery, particle embolization was performed using 500-700 micron embospheres. Near complete stasis was achieved. The microcatheter was removed and the Mickelson catheter was  redirected to select the left common iliac artery. The catheter was exchanged over Glidewire for a C2 catheter. The C2 catheter was used to select the internal iliac artery. Internal iliac angiogram was performed which demonstrated patent anterior and posterior proximal branches and an inferior rectal artery arising from the proximal anterior division. Attempts at cannulating the inferior rectal artery were made with the microcatheter and microwire, however vessel spasm occurred in the inferior rectal artery was no longer visible. The microcatheter was removed and a Waltman loop was formed to redirect the C2 catheter into the right internal iliac artery. Angiogram of the right internal iliac artery demonstrated patent anterior posterior division with the inferior rectal artery arising from the proximal aspect of the anterior division. The microcatheter and wire used to select  the anterior division of the internal iliac artery. Position was confirmed with limited angiogram. A Gel-Foam slurry was made and embolization of the anterior division of the right internal iliac artery was performed. Upon completion of embolization, there is sluggish pulsatile antegrade flow with pruning of the distal branches including the inferior rectal artery. The microcatheter was removed and the C2 catheter was redirected to the common iliac artery and removed over a J wire. A 6 French Angio-Seal was then placed and deployed without complication or change in pulses. The patient tolerated the procedure well and remained stable throughout the procedure. The patient was transferred to the floor in stable condition. IMPRESSION: 1. Rectal hyperemia without evidence of active extravasation. 2. Prophylactic superior rectal artery particle embolization and Gelfoam embolization of the anterior division of the right common iliac artery Ruthann Cancer, MD [facility] Radiology Electronically Signed   By: Ruthann Cancer M.D.   On: 11/04/2021 13:14   IR Angiogram Selective Each Additional Vessel  Result Date: 11/04/2021 INDICATION: 65 year old male with rectal hemorrhage status post recent transrectal prostate biopsy and status post right inferior rectal gelfoam embolization on 11/02/21 with recurrent hemorrhage and profound anemia. He refuses blood transfusion as he is Jehovah's Witness. Currently hemodynamically stable. EXAM: 1. Ultrasound-guided vascular access of the right common femoral artery. 2. Selective catheterization angiography of the inferior mesenteric artery, superior rectal artery, and anterior divisions of the bilateral internal iliac arteries 3. Particle embolization of the superior rectal artery. 4. Gel-Foam slurry embolization of the anterior division of the right internal iliac artery. MEDICATIONS: None. ANESTHESIA/SEDATION: Moderate (conscious)  sedation was employed during this procedure. A total of Versed 2 mg and Fentanyl 100 mcg was administered intravenously. Moderate Sedation Time: 76 minutes. The patient's level of consciousness and vital signs were monitored continuously by radiology nursing throughout the procedure under my direct supervision. CONTRAST:  48mL OMNIPAQUE IOHEXOL 300 MG/ML SOLN, 82mL OMNIPAQUE IOHEXOL 300 MG/ML SOLN, 39mL OMNIPAQUE IOHEXOL 300 MG/ML SOLN FLUOROSCOPY TIME:  Fluoroscopy Time: 29 minutes 30 seconds (1,318 mGy). COMPLICATIONS: None immediate. PROCEDURE: Informed consent was obtained from the patient following explanation of the procedure, risks, benefits and alternatives. The patient understands, agrees and consents for the procedure. All questions were addressed. A time out was performed prior to the initiation of the procedure. Maximal barrier sterile technique utilized including caps, mask, sterile gowns, sterile gloves, large sterile drape, hand hygiene, and Betadine prep. The right groin was prepped and draped in standard fashion. Preprocedure ultrasound evaluation demonstrated patency of the right common femoral artery. A permanent image was captured and stored in the record. Subdermal Local anesthesia was provided at the planned needle entry site with 1% lidocaine.  A small skin nick was made. Under direct ultrasound visualization, the right common femoral artery is accessed with standard micropuncture technique. A limited right lower extremity angiogram was performed which demonstrated adequate puncture site for closure device use. A J wire was directed to the abdominal aorta exchanged for a 5 French, 11 cm vascular sheath. A Mickelson catheter was then used to select the inferior mesenteric artery ostium. Inferior mesenteric angiogram was performed which demonstrated patency of the IMA. A Cook cantata microcatheter and a synchro soft microwire were then inserted into the proximal IMA and directed to the superior  rectal artery. Angiogram of the superior rectal artery was performed which demonstrated perfusion of the bilateral rectum without any evidence of active extravasation. There was a small focal focus of hyperemia about the rectum which could represent etiology of hemorrhage. With the catheter parked at the bifurcation of the proximal superior rectal artery, particle embolization was performed using 500-700 micron embospheres. Near complete stasis was achieved. The microcatheter was removed and the Mickelson catheter was redirected to select the left common iliac artery. The catheter was exchanged over Glidewire for a C2 catheter. The C2 catheter was used to select the internal iliac artery. Internal iliac angiogram was performed which demonstrated patent anterior and posterior proximal branches and an inferior rectal artery arising from the proximal anterior division. Attempts at cannulating the inferior rectal artery were made with the microcatheter and microwire, however vessel spasm occurred in the inferior rectal artery was no longer visible. The microcatheter was removed and a Waltman loop was formed to redirect the C2 catheter into the right internal iliac artery. Angiogram of the right internal iliac artery demonstrated patent anterior posterior division with the inferior rectal artery arising from the proximal aspect of the anterior division. The microcatheter and wire used to select the anterior division of the internal iliac artery. Position was confirmed with limited angiogram. A Gel-Foam slurry was made and embolization of the anterior division of the right internal iliac artery was performed. Upon completion of embolization, there is sluggish pulsatile antegrade flow with pruning of the distal branches including the inferior rectal artery. The microcatheter was removed and the C2 catheter was redirected to the common iliac artery and removed over a J wire. A 6 French Angio-Seal was then placed and  deployed without complication or change in pulses. The patient tolerated the procedure well and remained stable throughout the procedure. The patient was transferred to the floor in stable condition. IMPRESSION: 1. Rectal hyperemia without evidence of active extravasation. 2. Prophylactic superior rectal artery particle embolization and Gelfoam embolization of the anterior division of the right common iliac artery Ruthann Cancer, MD [facility] Radiology Electronically Signed   By: Ruthann Cancer M.D.   On: 11/04/2021 13:14   IR Angiogram Selective Each Additional Vessel  Result Date: 11/04/2021 INDICATION: 65 year old male with rectal hemorrhage status post recent transrectal prostate biopsy and status post right inferior rectal gelfoam embolization on 11/02/21 with recurrent hemorrhage and profound anemia. He refuses blood transfusion as he is Jehovah's Witness. Currently hemodynamically stable. EXAM: 1. Ultrasound-guided vascular access of the right common femoral artery. 2. Selective catheterization angiography of the inferior mesenteric artery, superior rectal artery, and anterior divisions of the bilateral internal iliac arteries 3. Particle embolization of the superior rectal artery. 4. Gel-Foam slurry embolization of the anterior division of the right internal iliac artery. MEDICATIONS: None. ANESTHESIA/SEDATION: Moderate (conscious) sedation was employed during this procedure. A total of Versed  2 mg and Fentanyl 100 mcg was administered intravenously. Moderate Sedation Time: 76 minutes. The patient's level of consciousness and vital signs were monitored continuously by radiology nursing throughout the procedure under my direct supervision. CONTRAST:  23mL OMNIPAQUE IOHEXOL 300 MG/ML SOLN, 34mL OMNIPAQUE IOHEXOL 300 MG/ML SOLN, 43mL OMNIPAQUE IOHEXOL 300 MG/ML SOLN FLUOROSCOPY TIME:  Fluoroscopy Time: 29 minutes 30 seconds (1,318 mGy). COMPLICATIONS: None  immediate. PROCEDURE: Informed consent was obtained from the patient following explanation of the procedure, risks, benefits and alternatives. The patient understands, agrees and consents for the procedure. All questions were addressed. A time out was performed prior to the initiation of the procedure. Maximal barrier sterile technique utilized including caps, mask, sterile gowns, sterile gloves, large sterile drape, hand hygiene, and Betadine prep. The right groin was prepped and draped in standard fashion. Preprocedure ultrasound evaluation demonstrated patency of the right common femoral artery. A permanent image was captured and stored in the record. Subdermal Local anesthesia was provided at the planned needle entry site with 1% lidocaine. A small skin nick was made. Under direct ultrasound visualization, the right common femoral artery is accessed with standard micropuncture technique. A limited right lower extremity angiogram was performed which demonstrated adequate puncture site for closure device use. A J wire was directed to the abdominal aorta exchanged for a 5 French, 11 cm vascular sheath. A Mickelson catheter was then used to select the inferior mesenteric artery ostium. Inferior mesenteric angiogram was performed which demonstrated patency of the IMA. A Cook cantata microcatheter and a synchro soft microwire were then inserted into the proximal IMA and directed to the superior rectal artery. Angiogram of the superior rectal artery was performed which demonstrated perfusion of the bilateral rectum without any evidence of active extravasation. There was a small focal focus of hyperemia about the rectum which could represent etiology of hemorrhage. With the catheter parked at the bifurcation of the proximal superior rectal artery, particle embolization was performed using 500-700 micron embospheres. Near complete stasis was achieved. The microcatheter was removed and the Mickelson catheter was  redirected to select the left common iliac artery. The catheter was exchanged over Glidewire for a C2 catheter. The C2 catheter was used to select the internal iliac artery. Internal iliac angiogram was performed which demonstrated patent anterior and posterior proximal branches and an inferior rectal artery arising from the proximal anterior division. Attempts at cannulating the inferior rectal artery were made with the microcatheter and microwire, however vessel spasm occurred in the inferior rectal artery was no longer visible. The microcatheter was removed and a Waltman loop was formed to redirect the C2 catheter into the right internal iliac artery. Angiogram of the right internal iliac artery demonstrated patent anterior posterior division with the inferior rectal artery arising from the proximal aspect of the anterior division. The microcatheter and wire used to select the anterior division of the internal iliac artery. Position was confirmed with limited angiogram. A Gel-Foam slurry was made and embolization of the anterior division of the right internal iliac artery was performed. Upon completion of embolization, there is sluggish pulsatile antegrade flow with pruning of the distal branches including the inferior rectal artery. The microcatheter was removed and the C2 catheter was redirected to the common iliac artery and removed over a J wire. A 6 French Angio-Seal was then placed and deployed without complication or change in pulses. The patient tolerated the procedure well and remained stable throughout the procedure. The patient was transferred to the floor in stable condition.  IMPRESSION: 1. Rectal hyperemia without evidence of active extravasation. 2. Prophylactic superior rectal artery particle embolization and Gelfoam embolization of the anterior division of the right common iliac artery Ruthann Cancer, MD [facility] Radiology Electronically Signed    By: Ruthann Cancer M.D.   On: 11/04/2021 13:14   IR Angiogram Selective Each Additional Vessel  Result Date: 11/04/2021 INDICATION: 64 year old male with rectal hemorrhage status post recent transrectal prostate biopsy and status post right inferior rectal gelfoam embolization on 11/02/21 with recurrent hemorrhage and profound anemia. He refuses blood transfusion as he is Jehovah's Witness. Currently hemodynamically stable. EXAM: 1. Ultrasound-guided vascular access of the right common femoral artery. 2. Selective catheterization angiography of the inferior mesenteric artery, superior rectal artery, and anterior divisions of the bilateral internal iliac arteries 3. Particle embolization of the superior rectal artery. 4. Gel-Foam slurry embolization of the anterior division of the right internal iliac artery. MEDICATIONS: None. ANESTHESIA/SEDATION: Moderate (conscious) sedation was employed during this procedure. A total of Versed 2 mg and Fentanyl 100 mcg was administered intravenously. Moderate Sedation Time: 76 minutes. The patient's level of consciousness and vital signs were monitored continuously by radiology nursing throughout the procedure under my direct supervision. CONTRAST:  58mL OMNIPAQUE IOHEXOL 300 MG/ML SOLN, 79mL OMNIPAQUE IOHEXOL 300 MG/ML SOLN, 69mL OMNIPAQUE IOHEXOL 300 MG/ML SOLN FLUOROSCOPY TIME:  Fluoroscopy Time: 29 minutes 30 seconds (1,318 mGy). COMPLICATIONS: None immediate. PROCEDURE: Informed consent was obtained from the patient following explanation of the procedure, risks, benefits and alternatives. The patient understands, agrees and consents for the procedure. All questions were addressed. A time out was performed prior to the initiation of the procedure. Maximal barrier sterile technique utilized including caps, mask, sterile gowns, sterile gloves, large sterile drape, hand hygiene, and Betadine prep. The right groin was prepped and draped in standard fashion. Preprocedure  ultrasound evaluation demonstrated patency of the right common femoral artery. A permanent image was captured and stored in the record. Subdermal Local anesthesia was provided at the planned needle entry site with 1% lidocaine. A small skin nick was made. Under direct ultrasound visualization, the right common femoral artery is accessed with standard micropuncture technique. A limited right lower extremity angiogram was performed which demonstrated adequate puncture site for closure device use. A J wire was directed to the abdominal aorta exchanged for a 5 French, 11 cm vascular sheath. A Mickelson catheter was then used to select the inferior mesenteric artery ostium. Inferior mesenteric angiogram was performed which demonstrated patency of the IMA. A Cook cantata microcatheter and a synchro soft microwire were then inserted into the proximal IMA and directed to the superior rectal artery. Angiogram of the superior rectal artery was performed which demonstrated perfusion of the bilateral rectum without any evidence of active extravasation. There was a small focal focus of hyperemia about the rectum which could represent etiology of hemorrhage. With the catheter parked at the bifurcation of the proximal superior rectal artery, particle embolization was performed using 500-700 micron embospheres. Near complete stasis was achieved. The microcatheter was removed and the Mickelson catheter was redirected to select the left common iliac artery. The catheter was exchanged over Glidewire for a C2 catheter. The C2 catheter was used to select the internal iliac artery. Internal iliac angiogram was performed which demonstrated patent anterior and posterior proximal branches and an inferior rectal artery arising from the proximal anterior division. Attempts at cannulating the inferior rectal artery were made with the microcatheter and microwire, however vessel spasm  occurred in the inferior rectal artery was no longer visible.  The microcatheter was removed and a Waltman loop was formed to redirect the C2 catheter into the right internal iliac artery. Angiogram of the right internal iliac artery demonstrated patent anterior posterior division with the inferior rectal artery arising from the proximal aspect of the anterior division. The microcatheter and wire used to select the anterior division of the internal iliac artery. Position was confirmed with limited angiogram. A Gel-Foam slurry was made and embolization of the anterior division of the right internal iliac artery was performed. Upon completion of embolization, there is sluggish pulsatile antegrade flow with pruning of the distal branches including the inferior rectal artery. The microcatheter was removed and the C2 catheter was redirected to the common iliac artery and removed over a J wire. A 6 French Angio-Seal was then placed and deployed without complication or change in pulses. The patient tolerated the procedure well and remained stable throughout the procedure. The patient was transferred to the floor in stable condition. IMPRESSION: 1. Rectal hyperemia without evidence of active extravasation. 2. Prophylactic superior rectal artery particle embolization and Gelfoam embolization of the anterior division of the right common iliac artery Ruthann Cancer, MD [facility] Radiology Electronically Signed   By: Ruthann Cancer M.D.   On: 11/04/2021 13:14   IR Angiogram Selective Each Additional Vessel  Result Date: 11/04/2021 INDICATION: 65 year old male with rectal hemorrhage status post recent transrectal prostate biopsy and status post right inferior rectal gelfoam embolization on 11/02/21 with recurrent hemorrhage and profound anemia. He refuses blood transfusion as he is Jehovah's Witness. Currently hemodynamically stable. EXAM: 1. Ultrasound-guided vascular access of the right common femoral artery. 2. Selective  catheterization angiography of the inferior mesenteric artery, superior rectal artery, and anterior divisions of the bilateral internal iliac arteries 3. Particle embolization of the superior rectal artery. 4. Gel-Foam slurry embolization of the anterior division of the right internal iliac artery. MEDICATIONS: None. ANESTHESIA/SEDATION: Moderate (conscious) sedation was employed during this procedure. A total of Versed 2 mg and Fentanyl 100 mcg was administered intravenously. Moderate Sedation Time: 76 minutes. The patient's level of consciousness and vital signs were monitored continuously by radiology nursing throughout the procedure under my direct supervision. CONTRAST:  48mL OMNIPAQUE IOHEXOL 300 MG/ML SOLN, 47mL OMNIPAQUE IOHEXOL 300 MG/ML SOLN, 69mL OMNIPAQUE IOHEXOL 300 MG/ML SOLN FLUOROSCOPY TIME:  Fluoroscopy Time: 29 minutes 30 seconds (1,318 mGy). COMPLICATIONS: None immediate. PROCEDURE: Informed consent was obtained from the patient following explanation of the procedure, risks, benefits and alternatives. The patient understands, agrees and consents for the procedure. All questions were addressed. A time out was performed prior to the initiation of the procedure. Maximal barrier sterile technique utilized including caps, mask, sterile gowns, sterile gloves, large sterile drape, hand hygiene, and Betadine prep. The right groin was prepped and draped in standard fashion. Preprocedure ultrasound evaluation demonstrated patency of the right common femoral artery. A permanent image was captured and stored in the record. Subdermal Local anesthesia was provided at the planned needle entry site with 1% lidocaine. A small skin nick was made. Under direct ultrasound visualization, the right common femoral artery is accessed with standard micropuncture technique. A limited right lower extremity angiogram was performed which demonstrated adequate puncture site for closure device use. A J wire was directed to the  abdominal aorta exchanged for a 5 French, 11 cm vascular sheath. A Mickelson catheter was then used to select the inferior mesenteric artery ostium.  Inferior mesenteric angiogram was performed which demonstrated patency of the IMA. A Cook cantata microcatheter and a synchro soft microwire were then inserted into the proximal IMA and directed to the superior rectal artery. Angiogram of the superior rectal artery was performed which demonstrated perfusion of the bilateral rectum without any evidence of active extravasation. There was a small focal focus of hyperemia about the rectum which could represent etiology of hemorrhage. With the catheter parked at the bifurcation of the proximal superior rectal artery, particle embolization was performed using 500-700 micron embospheres. Near complete stasis was achieved. The microcatheter was removed and the Mickelson catheter was redirected to select the left common iliac artery. The catheter was exchanged over Glidewire for a C2 catheter. The C2 catheter was used to select the internal iliac artery. Internal iliac angiogram was performed which demonstrated patent anterior and posterior proximal branches and an inferior rectal artery arising from the proximal anterior division. Attempts at cannulating the inferior rectal artery were made with the microcatheter and microwire, however vessel spasm occurred in the inferior rectal artery was no longer visible. The microcatheter was removed and a Waltman loop was formed to redirect the C2 catheter into the right internal iliac artery. Angiogram of the right internal iliac artery demonstrated patent anterior posterior division with the inferior rectal artery arising from the proximal aspect of the anterior division. The microcatheter and wire used to select the anterior division of the internal iliac artery. Position was confirmed with limited angiogram. A Gel-Foam slurry was made and embolization of the anterior division of the  right internal iliac artery was performed. Upon completion of embolization, there is sluggish pulsatile antegrade flow with pruning of the distal branches including the inferior rectal artery. The microcatheter was removed and the C2 catheter was redirected to the common iliac artery and removed over a J wire. A 6 French Angio-Seal was then placed and deployed without complication or change in pulses. The patient tolerated the procedure well and remained stable throughout the procedure. The patient was transferred to the floor in stable condition. IMPRESSION: 1. Rectal hyperemia without evidence of active extravasation. 2. Prophylactic superior rectal artery particle embolization and Gelfoam embolization of the anterior division of the right common iliac artery Ruthann Cancer, MD [facility] Radiology Electronically Signed   By: Ruthann Cancer M.D.   On: 11/04/2021 13:14   IR US Guide Vasc Access Right  Result Date: 11/04/2021 INDICATION: 65 year old male with rectal hemorrhage status post recent transrectal prostate biopsy and status post right inferior rectal gelfoam embolization on 11/02/21 with recurrent hemorrhage and profound anemia. He refuses blood transfusion as he is Jehovah's Witness. Currently hemodynamically stable. EXAM: 1. Ultrasound-guided vascular access of the right common femoral artery. 2. Selective catheterization angiography of the inferior mesenteric artery, superior rectal artery, and anterior divisions of the bilateral internal iliac arteries 3. Particle embolization of the superior rectal artery. 4. Gel-Foam slurry embolization of the anterior division of the right internal iliac artery. MEDICATIONS: None. ANESTHESIA/SEDATION: Moderate (conscious) sedation was employed during this procedure. A total of Versed 2 mg and Fentanyl 100 mcg was administered intravenously. Moderate Sedation Time: 76 minutes. The patient's level of consciousness  and vital signs were monitored continuously by radiology nursing throughout the procedure under my direct supervision. CONTRAST:  42mL OMNIPAQUE IOHEXOL 300 MG/ML SOLN, 62mL OMNIPAQUE IOHEXOL 300 MG/ML SOLN, 67mL OMNIPAQUE IOHEXOL 300 MG/ML SOLN FLUOROSCOPY TIME:  Fluoroscopy Time: 29 minutes 30 seconds (1,318 mGy). COMPLICATIONS: None immediate.  PROCEDURE: Informed consent was obtained from the patient following explanation of the procedure, risks, benefits and alternatives. The patient understands, agrees and consents for the procedure. All questions were addressed. A time out was performed prior to the initiation of the procedure. Maximal barrier sterile technique utilized including caps, mask, sterile gowns, sterile gloves, large sterile drape, hand hygiene, and Betadine prep. The right groin was prepped and draped in standard fashion. Preprocedure ultrasound evaluation demonstrated patency of the right common femoral artery. A permanent image was captured and stored in the record. Subdermal Local anesthesia was provided at the planned needle entry site with 1% lidocaine. A small skin nick was made. Under direct ultrasound visualization, the right common femoral artery is accessed with standard micropuncture technique. A limited right lower extremity angiogram was performed which demonstrated adequate puncture site for closure device use. A J wire was directed to the abdominal aorta exchanged for a 5 French, 11 cm vascular sheath. A Mickelson catheter was then used to select the inferior mesenteric artery ostium. Inferior mesenteric angiogram was performed which demonstrated patency of the IMA. A Cook cantata microcatheter and a synchro soft microwire were then inserted into the proximal IMA and directed to the superior rectal artery. Angiogram of the superior rectal artery was performed which demonstrated perfusion of the bilateral rectum without any evidence of active extravasation. There was a small focal  focus of hyperemia about the rectum which could represent etiology of hemorrhage. With the catheter parked at the bifurcation of the proximal superior rectal artery, particle embolization was performed using 500-700 micron embospheres. Near complete stasis was achieved. The microcatheter was removed and the Mickelson catheter was redirected to select the left common iliac artery. The catheter was exchanged over Glidewire for a C2 catheter. The C2 catheter was used to select the internal iliac artery. Internal iliac angiogram was performed which demonstrated patent anterior and posterior proximal branches and an inferior rectal artery arising from the proximal anterior division. Attempts at cannulating the inferior rectal artery were made with the microcatheter and microwire, however vessel spasm occurred in the inferior rectal artery was no longer visible. The microcatheter was removed and a Waltman loop was formed to redirect the C2 catheter into the right internal iliac artery. Angiogram of the right internal iliac artery demonstrated patent anterior posterior division with the inferior rectal artery arising from the proximal aspect of the anterior division. The microcatheter and wire used to select the anterior division of the internal iliac artery. Position was confirmed with limited angiogram. A Gel-Foam slurry was made and embolization of the anterior division of the right internal iliac artery was performed. Upon completion of embolization, there is sluggish pulsatile antegrade flow with pruning of the distal branches including the inferior rectal artery. The microcatheter was removed and the C2 catheter was redirected to the common iliac artery and removed over a J wire. A 6 French Angio-Seal was then placed and deployed without complication or change in pulses. The patient tolerated the procedure well and remained stable throughout the procedure. The patient was transferred to the floor in stable condition.  IMPRESSION: 1. Rectal hyperemia without evidence of active extravasation. 2. Prophylactic superior rectal artery particle embolization and Gelfoam embolization of the anterior division of the right common iliac artery Ruthann Cancer, MD [facility] Radiology Electronically Signed   By: Ruthann Cancer M.D.   On: 11/04/2021 13:14   ECHOCARDIOGRAM COMPLETE  Result Date: 11/03/2021    ECHOCARDIOGRAM REPORT   Patient  Name:   [name] Cozby Date of Exam: 11/03/2021 Medical Rec #:  [id number]       Height:       74.0 in Accession #:    [id number]      Weight:       250.0 lb Date of Birth:  1956-06-25       BSA:          2.390 m Patient Age:    51 years        BP:           118/73 mmHg Patient Gender: M               HR:           82 bpm. Exam Location:  Inpatient Procedure: 2D Echo, Color Doppler and Cardiac Doppler Indications:    Chest pain  History:        Patient has prior history of Echocardiogram examinations. Risk                 Factors:Hypertension, Diabetes and Sleep Apnea.  Sonographer:    Jyl Heinz Referring Phys: [id number] Yoder  1. Left ventricular ejection fraction, by estimation, is 65 to 70%. The left ventricle has normal function. The left ventricle has no regional wall motion abnormalities. There is mild concentric left ventricular hypertrophy. Left ventricular diastolic parameters are consistent with Grade I diastolic dysfunction (impaired relaxation).  2. Right ventricular systolic function is normal. The right ventricular size is normal. Tricuspid regurgitation signal is inadequate for assessing PA pressure.  3. The mitral valve is grossly normal. No evidence of mitral valve regurgitation. No evidence of mitral stenosis.  4. The aortic valve is tricuspid. Aortic valve regurgitation is not visualized. No aortic stenosis is present.  5. Aortic dilatation noted. There is mild dilatation of the aortic root, measuring 40 mm.  Conclusion(s)/Recommendation(s): Normal biventricular function without evidence of hemodynamically significant valvular heart disease. FINDINGS  Left Ventricle: Left ventricular ejection fraction, by estimation, is 65 to 70%. The left ventricle has normal function. The left ventricle has no regional wall motion abnormalities. The left ventricular internal cavity size was normal in size. There is  mild concentric left ventricular hypertrophy. Left ventricular diastolic parameters are consistent with Grade I diastolic dysfunction (impaired relaxation). Right Ventricle: The right ventricular size is normal. No increase in right ventricular wall thickness. Right ventricular systolic function is normal. Tricuspid regurgitation signal is inadequate for assessing PA pressure. Left Atrium: Left atrial size was normal in size. Right Atrium: Right atrial size was normal in size. Pericardium: There is no evidence of pericardial effusion. Mitral Valve: The mitral valve is grossly normal. Mild mitral annular calcification. No evidence of mitral valve regurgitation. No evidence of mitral valve stenosis. Tricuspid Valve: The tricuspid valve is grossly normal. Tricuspid valve regurgitation is trivial. No evidence of tricuspid stenosis. Aortic Valve: The aortic valve is tricuspid. Aortic valve regurgitation is not visualized. No aortic stenosis is present. Aortic valve mean gradient measures 10.0 mmHg. Aortic valve peak gradient measures 17.5 mmHg. Aortic valve area, by VTI measures 4.49 cm. Pulmonic Valve: The pulmonic valve was grossly normal. Pulmonic valve regurgitation is not visualized. No evidence of pulmonic stenosis. Aorta: Aortic dilatation noted. There is mild dilatation of the aortic root, measuring 40 mm. Venous: The inferior vena cava was not well visualized. IAS/Shunts: The atrial septum is grossly normal.  LEFT VENTRICLE PLAX 2D LVIDd:         4.10 cm  Diastology LVIDs:         2.50 cm     LV e' medial:    8.55  cm/s LV PW:         1.30 cm     LV E/e' medial:  10.2 LV IVS:        1.30 cm     LV e' lateral:   8.38 cm/s LVOT diam:     2.40 cm     LV E/e' lateral: 10.4 LV SV:         159 LV SV Index:   67 LVOT Area:     4.52 cm  LV Volumes (MOD) LV vol d, MOD A2C: 97.2 ml LV vol d, MOD A4C: 92.8 ml LV vol s, MOD A2C: 32.5 ml LV vol s, MOD A4C: 33.3 ml LV SV MOD A2C:     64.7 ml LV SV MOD A4C:     92.8 ml LV SV MOD BP:      63.0 ml RIGHT VENTRICLE RV Basal diam:  2.90 cm RV Mid diam:    2.60 cm RV S prime:     12.60 cm/s TAPSE (M-mode): 2.5 cm LEFT ATRIUM             Index        RIGHT ATRIUM           Index LA diam:        3.20 cm 1.34 cm/m   RA Area:     18.70 cm LA Vol (A2C):   47.4 ml 19.84 ml/m  RA Volume:   54.50 ml  22.81 ml/m LA Vol (A4C):   36.8 ml 15.40 ml/m LA Biplane Vol: 43.5 ml 18.20 ml/m  AORTIC VALVE AV Area (Vmax):    3.96 cm AV Area (Vmean):   4.21 cm AV Area (VTI):     4.49 cm AV Vmax:           209.00 cm/s AV Vmean:          152.500 cm/s AV VTI:            0.355 m AV Peak Grad:      17.5 mmHg AV Mean Grad:      10.0 mmHg LVOT Vmax:         183.00 cm/s LVOT Vmean:        142.000 cm/s LVOT VTI:          0.352 m LVOT/AV VTI ratio: 0.99  AORTA Ao Root diam: 4.00 cm Ao Asc diam:  3.40 cm MITRAL VALVE MV Area (PHT): 3.36 cm     SHUNTS MV Decel Time: 226 msec     Systemic VTI:  0.35 m MV E velocity: 86.90 cm/s   Systemic Diam: 2.40 cm MV A velocity: 128.00 cm/s MV E/A ratio:  0.68 Eleonore Chiquito MD Electronically signed by Eleonore Chiquito MD Signature Date/Time: 11/03/2021/3:38:33 PM    Final    IR EMBO ART  VEN HEMORR LYMPH EXTRAV  INC GUIDE ROADMAPPING  Result Date: 11/04/2021 INDICATION: 65 year old male with rectal hemorrhage status post recent transrectal prostate biopsy and status post right inferior rectal gelfoam embolization on 11/02/21 with recurrent hemorrhage and profound anemia. He refuses blood transfusion as he is [employer]hov[employer]'s [employer]. Currently hemodynamically stable. EXAM: 1.  Ultrasound-guided vascular access of the right common femoral artery. 2. Selective catheterization angiography of the inferior mesenteric artery, superior rectal artery, and anterior divisions of the bilateral internal iliac arteries 3. Particle embolization of the superior  rectal artery. 4. Gel-Foam slurry embolization of the anterior division of the right internal iliac artery. MEDICATIONS: None. ANESTHESIA/SEDATION: Moderate (conscious) sedation was employed during this procedure. A total of Versed 2 mg and Fentanyl 100 mcg was administered intravenously. Moderate Sedation Time: 76 minutes. The patient's level of consciousness and vital signs were monitored continuously by radiology nursing throughout the procedure under my direct supervision. CONTRAST:  49mL OMNIPAQUE IOHEXOL 300 MG/ML SOLN, 14mL OMNIPAQUE IOHEXOL 300 MG/ML SOLN, 28mL OMNIPAQUE IOHEXOL 300 MG/ML SOLN FLUOROSCOPY TIME:  Fluoroscopy Time: 29 minutes 30 seconds (1,318 mGy). COMPLICATIONS: None immediate. PROCEDURE: Informed consent was obtained from the patient following explanation of the procedure, risks, benefits and alternatives. The patient understands, agrees and consents for the procedure. All questions were addressed. A time out was performed prior to the initiation of the procedure. Maximal barrier sterile technique utilized including caps, mask, sterile gowns, sterile gloves, large sterile drape, hand hygiene, and Betadine prep. The right groin was prepped and draped in standard fashion. Preprocedure ultrasound evaluation demonstrated patency of the right common femoral artery. A permanent image was captured and stored in the record. Subdermal Local anesthesia was provided at the planned needle entry site with 1% lidocaine. A small skin nick was made. Under direct ultrasound visualization, the right common femoral artery is accessed with standard micropuncture technique. A limited right lower extremity angiogram was performed which  demonstrated adequate puncture site for closure device use. A J wire was directed to the abdominal aorta exchanged for a 5 French, 11 cm vascular sheath. A Mickelson catheter was then used to select the inferior mesenteric artery ostium. Inferior mesenteric angiogram was performed which demonstrated patency of the IMA. A Cook cantata microcatheter and a synchro soft microwire were then inserted into the proximal IMA and directed to the superior rectal artery. Angiogram of the superior rectal artery was performed which demonstrated perfusion of the bilateral rectum without any evidence of active extravasation. There was a small focal focus of hyperemia about the rectum which could represent etiology of hemorrhage. With the catheter parked at the bifurcation of the proximal superior rectal artery, particle embolization was performed using 500-700 micron embospheres. Near complete stasis was achieved. The microcatheter was removed and the Mickelson catheter was redirected to select the left common iliac artery. The catheter was exchanged over Glidewire for a C2 catheter. The C2 catheter was used to select the internal iliac artery. Internal iliac angiogram was performed which demonstrated patent anterior and posterior proximal branches and an inferior rectal artery arising from the proximal anterior division. Attempts at cannulating the inferior rectal artery were made with the microcatheter and microwire, however vessel spasm occurred in the inferior rectal artery was no longer visible. The microcatheter was removed and a Waltman loop was formed to redirect the C2 catheter into the right internal iliac artery. Angiogram of the right internal iliac artery demonstrated patent anterior posterior division with the inferior rectal artery arising from the proximal aspect of the anterior division. The microcatheter and wire used to select the anterior division of the internal iliac artery. Position was confirmed with limited  angiogram. A Gel-Foam slurry was made and embolization of the anterior division of the right internal iliac artery was performed. Upon completion of embolization, there is sluggish pulsatile antegrade flow with pruning of the distal branches including the inferior rectal artery. The microcatheter was removed and the C2 catheter was redirected to the common iliac artery and removed over a J wire. A 6 French Angio-Seal was then  placed and deployed without complication or change in pulses. The patient tolerated the procedure well and remained stable throughout the procedure. The patient was transferred to the floor in stable condition. IMPRESSION: 1. Rectal hyperemia without evidence of active extravasation. 2. Prophylactic superior rectal artery particle embolization and Gelfoam embolization of the anterior division of the right common iliac artery Ruthann Cancer, MD [facility] Electronically Signed   By: Ruthann Cancer M.D.   On: 11/04/2021 13:14   IR EMBO ART  VEN HEMORR LYMPH EXTRAV  INC GUIDE ROADMAPPING  Result Date: 11/04/2021 INDICATION: 65 year old male with rectal hemorrhage status post recent transrectal prostate biopsy and status post right inferior rectal gelfoam embolization on 11/02/21 with recurrent hemorrhage and profound anemia. He refuses blood transfusion as he is Jehov[employer]'s Witness. Currently hemodynamically stable. EXAM: 1. Ultrasound-guided vascular access of the right common femoral artery. 2. Selective catheterization angiography of the inferior mesenteric artery, superior rectal artery, and anterior divisions of the bilateral internal iliac arteries 3. Particle embolization of the superior rectal artery. 4. Gel-Foam slurry embolization of the anterior division of the right internal iliac artery. MEDICATIONS: None. ANESTHESIA/SEDATION: Moderate (conscious) sedation was employed during this procedure. A total of Versed 2 mg and  Fentanyl 100 mcg was administered intravenously. Moderate Sedation Time: 76 minutes. The patient's level of consciousness and vital signs were monitored continuously by radiology nursing throughout the procedure under my direct supervision. CONTRAST:  40mL OMNIPAQUE IOHEXOL 300 MG/ML SOLN, 97mL OMNIPAQUE IOHEXOL 300 MG/ML SOLN, 62mL OMNIPAQUE IOHEXOL 300 MG/ML SOLN FLUOROSCOPY TIME:  Fluoroscopy Time: 29 minutes 30 seconds (1,318 mGy). COMPLICATIONS: None immediate. PROCEDURE: Informed consent was obtained from the patient following explanation of the procedure, risks, benefits and alternatives. The patient understands, agrees and consents for the procedure. All questions were addressed. A time out was performed prior to the initiation of the procedure. Maximal barrier sterile technique utilized including caps, mask, sterile gowns, sterile gloves, large sterile drape, hand hygiene, and Betadine prep. The right groin was prepped and draped in standard fashion. Preprocedure ultrasound evaluation demonstrated patency of the right common femoral artery. A permanent image was captured and stored in the record. Subdermal Local anesthesia was provided at the planned needle entry site with 1% lidocaine. A small skin nick was made. Under direct ultrasound visualization, the right common femoral artery is accessed with standard micropuncture technique. A limited right lower extremity angiogram was performed which demonstrated adequate puncture site for closure device use. A J wire was directed to the abdominal aorta exchanged for a 5 French, 11 cm vascular sheath. A Mickelson catheter was then used to select the inferior mesenteric artery ostium. Inferior mesenteric angiogram was performed which demonstrated patency of the IMA. A Cook cantata microcatheter and a synchro soft microwire were then inserted into the proximal IMA and directed to the superior rectal artery. Angiogram of the superior rectal artery was performed  which demonstrated perfusion of the bilateral rectum without any evidence of active extravasation. There was a small focal focus of hyperemia about the rectum which could represent etiology of hemorrhage. With the catheter parked at the bifurcation of the proximal superior rectal artery, particle embolization was performed using 500-700 micron embospheres. Near complete stasis was achieved. The microcatheter was removed and the Mickelson catheter was redirected to select the left common iliac artery. The catheter was exchanged over Glidewire for a C2 catheter. The C2 catheter was used to select the internal iliac artery. Internal iliac angiogram was  performed which demonstrated patent anterior and posterior proximal branches and an inferior rectal artery arising from the proximal anterior division. Attempts at cannulating the inferior rectal artery were made with the microcatheter and microwire, however vessel spasm occurred in the inferior rectal artery was no longer visible. The microcatheter was removed and a Waltman loop was formed to redirect the C2 catheter into the right internal iliac artery. Angiogram of the right internal iliac artery demonstrated patent anterior posterior division with the inferior rectal artery arising from the proximal aspect of the anterior division. The microcatheter and wire used to select the anterior division of the internal iliac artery. Position was confirmed with limited angiogram. A Gel-Foam slurry was made and embolization of the anterior division of the right internal iliac artery was performed. Upon completion of embolization, there is sluggish pulsatile antegrade flow with pruning of the distal branches including the inferior rectal artery. The microcatheter was removed and the C2 catheter was redirected to the common iliac artery and removed over a J wire. A 6 French Angio-Seal was then placed and deployed without complication or change in pulses. The patient tolerated the  procedure well and remained stable throughout the procedure. The patient was transferred to the floor in stable condition. IMPRESSION: 1. Rectal hyperemia without evidence of active extravasation. 2. Prophylactic superior rectal artery particle embolization and Gelfoam embolization of the anterior division of the right common iliac artery Ruthann Cancer, MD [facility] Radiology Electronically Signed   By: Ruthann Cancer M.D.   On: 11/04/2021 13:14        Scheduled Meds:  atorvastatin  80 mg Oral QHS   folic acid  1 mg Oral Daily   gabapentin  200 mg Oral QHS   insulin aspart  0-9 Units Subcutaneous Q6H   pantoprazole (PROTONIX) IV  40 mg Intravenous Q12H   sodium phosphate  1 enema Rectal Once   vitamin B-12  1,000 mcg Oral Daily   Continuous Infusions:  sodium chloride 50 mL/hr at 11/05/21 1007   ferumoxytol     lactated ringers 10 mL/hr at 11/03/21 1919     LOS: 2 days        Hosie Poisson, MD [facility]   To contact the attending provider between 7A-7P or the covering provider during after hours 7P-7A, please log into the web site [email or web address] and access using universal [facility] password for that web site. If you do not have the password, please call the hospital operator.  11/05/2021, 11:20 AM

## 2021-11-05 NOTE — Progress Notes (Signed)
2 Days Post-Op  Subjective: Feels well.  No pain.  No further bleeding.  No BM, just flatus.  Tolerating CLD   ROS: See above, otherwise other systems negative  Objective: Vital signs in last 24 hours: Temp:  [98.4 F (36.9 C)-100.1 F (37.8 C)] 98.7 F (37.1 C) (12/01 0429) Pulse Rate:  [64-70] 65 (12/01 0429) Resp:  [16-18] 18 (12/01 0429) BP: (103-131)/(57-64) 103/58 (12/01 0429) SpO2:  [96 %-100 %] 98 % (12/01 0429) Last BM Date: 11/03/21  Intake/Output from previous day: 11/30 0701 - 12/01 0700 In: 2083.6 [P.O.:830; I.V.:1253.6] Out: 4098 [Urine:2455] Intake/Output this shift: No intake/output data recorded.  PE: Abd: soft, NT, Nd, +BS  Lab Results:  Recent Labs    11/03/21 0455 11/03/21 1142 11/04/21 1843 11/05/21 0050  WBC 7.8  --   --   --   HGB 6.8*   < > 4.9* 6.7*  HCT 22.2*   < > 15.5* 21.6*  PLT 146*  --   --   --    < > = values in this interval not displayed.   BMET Recent Labs    11/03/21 0455 11/04/21 0501  NA 134* 135  K 4.5 4.0  CL 107 110  CO2 21* 24  GLUCOSE 168* 117*  BUN 36* 25*  CREATININE 1.67* 1.26*  CALCIUM 8.1* 7.6*   PT/INR Recent Labs    11/03/21 2137  LABPROT 15.6*  INR 1.2   CMP     Component Value Date/Time   NA 135 11/04/2021 0501   NA 137 02/10/2021 1205   K 4.0 11/04/2021 0501   CL 110 11/04/2021 0501   CO2 24 11/04/2021 0501   GLUCOSE 117 (H) 11/04/2021 0501   BUN 25 (H) 11/04/2021 0501   BUN 28 (H) 02/10/2021 1205   CREATININE 1.26 (H) 11/04/2021 0501   CREATININE 1.22 07/09/2020 1032   CALCIUM 7.6 (L) 11/04/2021 0501   PROT 4.9 (L) 11/03/2021 0455   ALBUMIN 3.1 (L) 11/03/2021 0455   AST 28 11/03/2021 0455   ALT 27 11/03/2021 0455   ALKPHOS 37 (L) 11/03/2021 0455   BILITOT 0.5 11/03/2021 0455   GFRNONAA >60 11/04/2021 0501   GFRNONAA 61 07/09/2020 1032   GFRAA 71 07/09/2020 1032   Lipase     Component Value Date/Time   LIPASE 18 12/21/2015 2050       Studies/Results: IR Angiogram  Visceral Selective  Result Date: 11/04/2021 INDICATION: 65 year old male with rectal hemorrhage status post recent transrectal prostate biopsy and status post right inferior rectal gelfoam embolization on 11/02/21 with recurrent hemorrhage and profound anemia. He refuses blood transfusion as he is Jehovah's Witness. Currently hemodynamically stable. EXAM: 1. Ultrasound-guided vascular access of the right common femoral artery. 2. Selective catheterization angiography of the inferior mesenteric artery, superior rectal artery, and anterior divisions of the bilateral internal iliac arteries 3. Particle embolization of the superior rectal artery. 4. Gel-Foam slurry embolization of the anterior division of the right internal iliac artery. MEDICATIONS: None. ANESTHESIA/SEDATION: Moderate (conscious) sedation was employed during this procedure. A total of Versed 2 mg and Fentanyl 100 mcg was administered intravenously. Moderate Sedation Time: 76 minutes. The patient's level of consciousness and vital signs were monitored continuously by radiology nursing throughout the procedure under my direct supervision. CONTRAST:  82mL OMNIPAQUE IOHEXOL 300 MG/ML SOLN, 54mL OMNIPAQUE IOHEXOL 300 MG/ML SOLN, 21mL OMNIPAQUE IOHEXOL 300 MG/ML SOLN FLUOROSCOPY TIME:  Fluoroscopy Time: 29 minutes 30 seconds (1,318 mGy). COMPLICATIONS: None immediate. PROCEDURE: Informed  consent was obtained from the patient following explanation of the procedure, risks, benefits and alternatives. The patient understands, agrees and consents for the procedure. All questions were addressed. A time out was performed prior to the initiation of the procedure. Maximal barrier sterile technique utilized including caps, mask, sterile gowns, sterile gloves, large sterile drape, hand hygiene, and Betadine prep. The right groin was prepped and draped in standard fashion. Preprocedure ultrasound evaluation demonstrated patency of the right common femoral artery. A  permanent image was captured and stored in the record. Subdermal Local anesthesia was provided at the planned needle entry site with 1% lidocaine. A small skin nick was made. Under direct ultrasound visualization, the right common femoral artery is accessed with standard micropuncture technique. A limited right lower extremity angiogram was performed which demonstrated adequate puncture site for closure device use. A J wire was directed to the abdominal aorta exchanged for a 5 French, 11 cm vascular sheath. A Mickelson catheter was then used to select the inferior mesenteric artery ostium. Inferior mesenteric angiogram was performed which demonstrated patency of the IMA. A Cook cantata microcatheter and a synchro soft microwire were then inserted into the proximal IMA and directed to the superior rectal artery. Angiogram of the superior rectal artery was performed which demonstrated perfusion of the bilateral rectum without any evidence of active extravasation. There was a small focal focus of hyperemia about the rectum which could represent etiology of hemorrhage. With the catheter parked at the bifurcation of the proximal superior rectal artery, particle embolization was performed using 500-700 micron embospheres. Near complete stasis was achieved. The microcatheter was removed and the Mickelson catheter was redirected to select the left common iliac artery. The catheter was exchanged over Glidewire for a C2 catheter. The C2 catheter was used to select the internal iliac artery. Internal iliac angiogram was performed which demonstrated patent anterior and posterior proximal branches and an inferior rectal artery arising from the proximal anterior division. Attempts at cannulating the inferior rectal artery were made with the microcatheter and microwire, however vessel spasm occurred in the inferior rectal artery was no longer visible. The microcatheter was removed and a Waltman loop was formed to redirect the C2  catheter into the right internal iliac artery. Angiogram of the right internal iliac artery demonstrated patent anterior posterior division with the inferior rectal artery arising from the proximal aspect of the anterior division. The microcatheter and wire used to select the anterior division of the internal iliac artery. Position was confirmed with limited angiogram. A Gel-Foam slurry was made and embolization of the anterior division of the right internal iliac artery was performed. Upon completion of embolization, there is sluggish pulsatile antegrade flow with pruning of the distal branches including the inferior rectal artery. The microcatheter was removed and the C2 catheter was redirected to the common iliac artery and removed over a J wire. A 6 French Angio-Seal was then placed and deployed without complication or change in pulses. The patient tolerated the procedure well and remained stable throughout the procedure. The patient was transferred to the floor in stable condition. IMPRESSION: 1. Rectal hyperemia without evidence of active extravasation. 2. Prophylactic superior rectal artery particle embolization and Gelfoam embolization of the anterior division of the right common iliac artery Ruthann Cancer, MD [facility] Electronically Signed   By: Ruthann Cancer M.D.   On: 11/04/2021 13:14   IR Angiogram Pelvis Selective Or Supraselective  Result Date: 11/04/2021 INDICATION: 65 year old male with  rectal hemorrhage status post recent transrectal prostate biopsy and status post right inferior rectal gelfoam embolization on 11/02/21 with recurrent hemorrhage and profound anemia. He refuses blood transfusion as he is Jehovah's Witness. Currently hemodynamically stable. EXAM: 1. Ultrasound-guided vascular access of the right common femoral artery. 2. Selective catheterization angiography of the inferior mesenteric artery, superior rectal artery, and  anterior divisions of the bilateral internal iliac arteries 3. Particle embolization of the superior rectal artery. 4. Gel-Foam slurry embolization of the anterior division of the right internal iliac artery. MEDICATIONS: None. ANESTHESIA/SEDATION: Moderate (conscious) sedation was employed during this procedure. A total of Versed 2 mg and Fentanyl 100 mcg was administered intravenously. Moderate Sedation Time: 76 minutes. The patient's level of consciousness and vital signs were monitored continuously by radiology nursing throughout the procedure under my direct supervision. CONTRAST:  54mL OMNIPAQUE IOHEXOL 300 MG/ML SOLN, 5mL OMNIPAQUE IOHEXOL 300 MG/ML SOLN, 47mL OMNIPAQUE IOHEXOL 300 MG/ML SOLN FLUOROSCOPY TIME:  Fluoroscopy Time: 29 minutes 30 seconds (1,318 mGy). COMPLICATIONS: None immediate. PROCEDURE: Informed consent was obtained from the patient following explanation of the procedure, risks, benefits and alternatives. The patient understands, agrees and consents for the procedure. All questions were addressed. A time out was performed prior to the initiation of the procedure. Maximal barrier sterile technique utilized including caps, mask, sterile gowns, sterile gloves, large sterile drape, hand hygiene, and Betadine prep. The right groin was prepped and draped in standard fashion. Preprocedure ultrasound evaluation demonstrated patency of the right common femoral artery. A permanent image was captured and stored in the record. Subdermal Local anesthesia was provided at the planned needle entry site with 1% lidocaine. A small skin nick was made. Under direct ultrasound visualization, the right common femoral artery is accessed with standard micropuncture technique. A limited right lower extremity angiogram was performed which demonstrated adequate puncture site for closure device use. A J wire was directed to the abdominal aorta exchanged for a 5 French, 11 cm vascular sheath. A Mickelson catheter was  then used to select the inferior mesenteric artery ostium. Inferior mesenteric angiogram was performed which demonstrated patency of the IMA. A Cook cantata microcatheter and a synchro soft microwire were then inserted into the proximal IMA and directed to the superior rectal artery. Angiogram of the superior rectal artery was performed which demonstrated perfusion of the bilateral rectum without any evidence of active extravasation. There was a small focal focus of hyperemia about the rectum which could represent etiology of hemorrhage. With the catheter parked at the bifurcation of the proximal superior rectal artery, particle embolization was performed using 500-700 micron embospheres. Near complete stasis was achieved. The microcatheter was removed and the Mickelson catheter was redirected to select the left common iliac artery. The catheter was exchanged over Glidewire for a C2 catheter. The C2 catheter was used to select the internal iliac artery. Internal iliac angiogram was performed which demonstrated patent anterior and posterior proximal branches and an inferior rectal artery arising from the proximal anterior division. Attempts at cannulating the inferior rectal artery were made with the microcatheter and microwire, however vessel spasm occurred in the inferior rectal artery was no longer visible. The microcatheter was removed and a Waltman loop was formed to redirect the C2 catheter into the right internal iliac artery. Angiogram of the right internal iliac artery demonstrated patent anterior posterior division with the inferior rectal artery arising from the proximal aspect of the anterior division. The microcatheter and wire used to select the anterior division of the internal iliac  artery. Position was confirmed with limited angiogram. A Gel-Foam slurry was made and embolization of the anterior division of the right internal iliac artery was performed. Upon completion of embolization, there is  sluggish pulsatile antegrade flow with pruning of the distal branches including the inferior rectal artery. The microcatheter was removed and the C2 catheter was redirected to the common iliac artery and removed over a J wire. A 6 French Angio-Seal was then placed and deployed without complication or change in pulses. The patient tolerated the procedure well and remained stable throughout the procedure. The patient was transferred to the floor in stable condition. IMPRESSION: 1. Rectal hyperemia without evidence of active extravasation. 2. Prophylactic superior rectal artery particle embolization and Gelfoam embolization of the anterior division of the right common iliac artery Ruthann Cancer, MD [facility] Radiology Electronically Signed   By: Ruthann Cancer M.D.   On: 11/04/2021 13:14   IR Angiogram Selective Each Additional Vessel  Result Date: 11/04/2021 INDICATION: 65 year old male with rectal hemorrhage status post recent transrectal prostate biopsy and status post right inferior rectal gelfoam embolization on 11/02/21 with recurrent hemorrhage and profound anemia. He refuses blood transfusion as he is Jehovah's Witness. Currently hemodynamically stable. EXAM: 1. Ultrasound-guided vascular access of the right common femoral artery. 2. Selective catheterization angiography of the inferior mesenteric artery, superior rectal artery, and anterior divisions of the bilateral internal iliac arteries 3. Particle embolization of the superior rectal artery. 4. Gel-Foam slurry embolization of the anterior division of the right internal iliac artery. MEDICATIONS: None. ANESTHESIA/SEDATION: Moderate (conscious) sedation was employed during this procedure. A total of Versed 2 mg and Fentanyl 100 mcg was administered intravenously. Moderate Sedation Time: 76 minutes. The patient's level of consciousness and vital signs were monitored continuously by radiology nursing  throughout the procedure under my direct supervision. CONTRAST:  33mL OMNIPAQUE IOHEXOL 300 MG/ML SOLN, 36mL OMNIPAQUE IOHEXOL 300 MG/ML SOLN, 42mL OMNIPAQUE IOHEXOL 300 MG/ML SOLN FLUOROSCOPY TIME:  Fluoroscopy Time: 29 minutes 30 seconds (1,318 mGy). COMPLICATIONS: None immediate. PROCEDURE: Informed consent was obtained from the patient following explanation of the procedure, risks, benefits and alternatives. The patient understands, agrees and consents for the procedure. All questions were addressed. A time out was performed prior to the initiation of the procedure. Maximal barrier sterile technique utilized including caps, mask, sterile gowns, sterile gloves, large sterile drape, hand hygiene, and Betadine prep. The right groin was prepped and draped in standard fashion. Preprocedure ultrasound evaluation demonstrated patency of the right common femoral artery. A permanent image was captured and stored in the record. Subdermal Local anesthesia was provided at the planned needle entry site with 1% lidocaine. A small skin nick was made. Under direct ultrasound visualization, the right common femoral artery is accessed with standard micropuncture technique. A limited right lower extremity angiogram was performed which demonstrated adequate puncture site for closure device use. A J wire was directed to the abdominal aorta exchanged for a 5 French, 11 cm vascular sheath. A Mickelson catheter was then used to select the inferior mesenteric artery ostium. Inferior mesenteric angiogram was performed which demonstrated patency of the IMA. A Cook cantata microcatheter and a synchro soft microwire were then inserted into the proximal IMA and directed to the superior rectal artery. Angiogram of the superior rectal artery was performed which demonstrated perfusion of the bilateral rectum without any evidence of active extravasation. There was a small focal focus of hyperemia about the rectum which could represent etiology  of  hemorrhage. With the catheter parked at the bifurcation of the proximal superior rectal artery, particle embolization was performed using 500-700 micron embospheres. Near complete stasis was achieved. The microcatheter was removed and the Mickelson catheter was redirected to select the left common iliac artery. The catheter was exchanged over Glidewire for a C2 catheter. The C2 catheter was used to select the internal iliac artery. Internal iliac angiogram was performed which demonstrated patent anterior and posterior proximal branches and an inferior rectal artery arising from the proximal anterior division. Attempts at cannulating the inferior rectal artery were made with the microcatheter and microwire, however vessel spasm occurred in the inferior rectal artery was no longer visible. The microcatheter was removed and a Waltman loop was formed to redirect the C2 catheter into the right internal iliac artery. Angiogram of the right internal iliac artery demonstrated patent anterior posterior division with the inferior rectal artery arising from the proximal aspect of the anterior division. The microcatheter and wire used to select the anterior division of the internal iliac artery. Position was confirmed with limited angiogram. A Gel-Foam slurry was made and embolization of the anterior division of the right internal iliac artery was performed. Upon completion of embolization, there is sluggish pulsatile antegrade flow with pruning of the distal branches including the inferior rectal artery. The microcatheter was removed and the C2 catheter was redirected to the common iliac artery and removed over a J wire. A 6 French Angio-Seal was then placed and deployed without complication or change in pulses. The patient tolerated the procedure well and remained stable throughout the procedure. The patient was transferred to the floor in stable condition. IMPRESSION: 1. Rectal hyperemia without evidence of active  extravasation. 2. Prophylactic superior rectal artery particle embolization and Gelfoam embolization of the anterior division of the right common iliac artery Ruthann Cancer, MD [facility] Radiology Electronically Signed   By: Ruthann Cancer M.D.   On: 11/04/2021 13:14   IR Angiogram Selective Each Additional Vessel  Result Date: 11/04/2021 INDICATION: 65 year old male with rectal hemorrhage status post recent transrectal prostate biopsy and status post right inferior rectal gelfoam embolization on 11/02/21 with recurrent hemorrhage and profound anemia. He refuses blood transfusion as he is Jehovah's Witness. Currently hemodynamically stable. EXAM: 1. Ultrasound-guided vascular access of the right common femoral artery. 2. Selective catheterization angiography of the inferior mesenteric artery, superior rectal artery, and anterior divisions of the bilateral internal iliac arteries 3. Particle embolization of the superior rectal artery. 4. Gel-Foam slurry embolization of the anterior division of the right internal iliac artery. MEDICATIONS: None. ANESTHESIA/SEDATION: Moderate (conscious) sedation was employed during this procedure. A total of Versed 2 mg and Fentanyl 100 mcg was administered intravenously. Moderate Sedation Time: 76 minutes. The patient's level of consciousness and vital signs were monitored continuously by radiology nursing throughout the procedure under my direct supervision. CONTRAST:  2mL OMNIPAQUE IOHEXOL 300 MG/ML SOLN, 67mL OMNIPAQUE IOHEXOL 300 MG/ML SOLN, 12mL OMNIPAQUE IOHEXOL 300 MG/ML SOLN FLUOROSCOPY TIME:  Fluoroscopy Time: 29 minutes 30 seconds (1,318 mGy). COMPLICATIONS: None immediate. PROCEDURE: Informed consent was obtained from the patient following explanation of the procedure, risks, benefits and alternatives. The patient understands, agrees and consents for the procedure. All questions were addressed. A time out was performed  prior to the initiation of the procedure. Maximal barrier sterile technique utilized including caps, mask, sterile gowns, sterile gloves, large sterile drape, hand hygiene, and Betadine prep. The right groin was prepped and draped in standard  fashion. Preprocedure ultrasound evaluation demonstrated patency of the right common femoral artery. A permanent image was captured and stored in the record. Subdermal Local anesthesia was provided at the planned needle entry site with 1% lidocaine. A small skin nick was made. Under direct ultrasound visualization, the right common femoral artery is accessed with standard micropuncture technique. A limited right lower extremity angiogram was performed which demonstrated adequate puncture site for closure device use. A J wire was directed to the abdominal aorta exchanged for a 5 French, 11 cm vascular sheath. A Mickelson catheter was then used to select the inferior mesenteric artery ostium. Inferior mesenteric angiogram was performed which demonstrated patency of the IMA. A Cook cantata microcatheter and a synchro soft microwire were then inserted into the proximal IMA and directed to the superior rectal artery. Angiogram of the superior rectal artery was performed which demonstrated perfusion of the bilateral rectum without any evidence of active extravasation. There was a small focal focus of hyperemia about the rectum which could represent etiology of hemorrhage. With the catheter parked at the bifurcation of the proximal superior rectal artery, particle embolization was performed using 500-700 micron embospheres. Near complete stasis was achieved. The microcatheter was removed and the Mickelson catheter was redirected to select the left common iliac artery. The catheter was exchanged over Glidewire for a C2 catheter. The C2 catheter was used to select the internal iliac artery. Internal iliac angiogram was performed which demonstrated patent anterior and posterior proximal  branches and an inferior rectal artery arising from the proximal anterior division. Attempts at cannulating the inferior rectal artery were made with the microcatheter and microwire, however vessel spasm occurred in the inferior rectal artery was no longer visible. The microcatheter was removed and a Waltman loop was formed to redirect the C2 catheter into the right internal iliac artery. Angiogram of the right internal iliac artery demonstrated patent anterior posterior division with the inferior rectal artery arising from the proximal aspect of the anterior division. The microcatheter and wire used to select the anterior division of the internal iliac artery. Position was confirmed with limited angiogram. A Gel-Foam slurry was made and embolization of the anterior division of the right internal iliac artery was performed. Upon completion of embolization, there is sluggish pulsatile antegrade flow with pruning of the distal branches including the inferior rectal artery. The microcatheter was removed and the C2 catheter was redirected to the common iliac artery and removed over a J wire. A 6 French Angio-Seal was then placed and deployed without complication or change in pulses. The patient tolerated the procedure well and remained stable throughout the procedure. The patient was transferred to the floor in stable condition. IMPRESSION: 1. Rectal hyperemia without evidence of active extravasation. 2. Prophylactic superior rectal artery particle embolization and Gelfoam embolization of the anterior division of the right common iliac artery Ruthann Cancer, MD [facility] Radiology Electronically Signed   By: Ruthann Cancer M.D.   On: 11/04/2021 13:14   IR Angiogram Selective Each Additional Vessel  Result Date: 11/04/2021 INDICATION: 65 year old male with rectal hemorrhage status post recent transrectal prostate biopsy and status post right inferior rectal gelfoam  embolization on 11/02/21 with recurrent hemorrhage and profound anemia. He refuses blood transfusion as he is Jehovah's Witness. Currently hemodynamically stable. EXAM: 1. Ultrasound-guided vascular access of the right common femoral artery. 2. Selective catheterization angiography of the inferior mesenteric artery, superior rectal artery, and anterior divisions of the bilateral internal iliac arteries 3.  Particle embolization of the superior rectal artery. 4. Gel-Foam slurry embolization of the anterior division of the right internal iliac artery. MEDICATIONS: None. ANESTHESIA/SEDATION: Moderate (conscious) sedation was employed during this procedure. A total of Versed 2 mg and Fentanyl 100 mcg was administered intravenously. Moderate Sedation Time: 76 minutes. The patient's level of consciousness and vital signs were monitored continuously by radiology nursing throughout the procedure under my direct supervision. CONTRAST:  80mL OMNIPAQUE IOHEXOL 300 MG/ML SOLN, 68mL OMNIPAQUE IOHEXOL 300 MG/ML SOLN, 66mL OMNIPAQUE IOHEXOL 300 MG/ML SOLN FLUOROSCOPY TIME:  Fluoroscopy Time: 29 minutes 30 seconds (1,318 mGy). COMPLICATIONS: None immediate. PROCEDURE: Informed consent was obtained from the patient following explanation of the procedure, risks, benefits and alternatives. The patient understands, agrees and consents for the procedure. All questions were addressed. A time out was performed prior to the initiation of the procedure. Maximal barrier sterile technique utilized including caps, mask, sterile gowns, sterile gloves, large sterile drape, hand hygiene, and Betadine prep. The right groin was prepped and draped in standard fashion. Preprocedure ultrasound evaluation demonstrated patency of the right common femoral artery. A permanent image was captured and stored in the record. Subdermal Local anesthesia was provided at the planned needle entry site with 1% lidocaine. A small skin nick was made. Under direct  ultrasound visualization, the right common femoral artery is accessed with standard micropuncture technique. A limited right lower extremity angiogram was performed which demonstrated adequate puncture site for closure device use. A J wire was directed to the abdominal aorta exchanged for a 5 French, 11 cm vascular sheath. A Mickelson catheter was then used to select the inferior mesenteric artery ostium. Inferior mesenteric angiogram was performed which demonstrated patency of the IMA. A Cook cantata microcatheter and a synchro soft microwire were then inserted into the proximal IMA and directed to the superior rectal artery. Angiogram of the superior rectal artery was performed which demonstrated perfusion of the bilateral rectum without any evidence of active extravasation. There was a small focal focus of hyperemia about the rectum which could represent etiology of hemorrhage. With the catheter parked at the bifurcation of the proximal superior rectal artery, particle embolization was performed using 500-700 micron embospheres. Near complete stasis was achieved. The microcatheter was removed and the Mickelson catheter was redirected to select the left common iliac artery. The catheter was exchanged over Glidewire for a C2 catheter. The C2 catheter was used to select the internal iliac artery. Internal iliac angiogram was performed which demonstrated patent anterior and posterior proximal branches and an inferior rectal artery arising from the proximal anterior division. Attempts at cannulating the inferior rectal artery were made with the microcatheter and microwire, however vessel spasm occurred in the inferior rectal artery was no longer visible. The microcatheter was removed and a Waltman loop was formed to redirect the C2 catheter into the right internal iliac artery. Angiogram of the right internal iliac artery demonstrated patent anterior posterior division with the inferior rectal artery arising from the  proximal aspect of the anterior division. The microcatheter and wire used to select the anterior division of the internal iliac artery. Position was confirmed with limited angiogram. A Gel-Foam slurry was made and embolization of the anterior division of the right internal iliac artery was performed. Upon completion of embolization, there is sluggish pulsatile antegrade flow with pruning of the distal branches including the inferior rectal artery. The microcatheter was removed and the C2 catheter was redirected to the common iliac artery and removed over a J wire. A  6 [place] Angio-Seal was then placed and deployed without complication or change in pulses. The patient tolerated the procedure well and remained stable throughout the procedure. The patient was transferred to the floor in stable condition. IMPRESSION: 1. Rectal hyperemia without evidence of active extravasation. 2. Prophylactic superior rectal artery particle embolization and Gelfoam embolization of the anterior division of the right common iliac artery Ruthann Cancer, MD [facility] Radiology Electronically Signed   By: Ruthann Cancer M.D.   On: 11/04/2021 13:14   IR Angiogram Selective Each Additional Vessel  Result Date: 11/04/2021 INDICATION: 65 year old male with rectal hemorrhage status post recent transrectal prostate biopsy and status post right inferior rectal gelfoam embolization on 11/02/21 with recurrent hemorrhage and profound anemia. He refuses blood transfusion as he is Jehovah's Witness. Currently hemodynamically stable. EXAM: 1. Ultrasound-guided vascular access of the right common femoral artery. 2. Selective catheterization angiography of the inferior mesenteric artery, superior rectal artery, and anterior divisions of the bilateral internal iliac arteries 3. Particle embolization of the superior rectal artery. 4. Gel-Foam slurry embolization of the anterior division of the right  internal iliac artery. MEDICATIONS: None. ANESTHESIA/SEDATION: Moderate (conscious) sedation was employed during this procedure. A total of Versed 2 mg and Fentanyl 100 mcg was administered intravenously. Moderate Sedation Time: 76 minutes. The patient's level of consciousness and vital signs were monitored continuously by radiology nursing throughout the procedure under my direct supervision. CONTRAST:  57mL OMNIPAQUE IOHEXOL 300 MG/ML SOLN, 49mL OMNIPAQUE IOHEXOL 300 MG/ML SOLN, 43mL OMNIPAQUE IOHEXOL 300 MG/ML SOLN FLUOROSCOPY TIME:  Fluoroscopy Time: 29 minutes 30 seconds (1,318 mGy). COMPLICATIONS: None immediate. PROCEDURE: Informed consent was obtained from the patient following explanation of the procedure, risks, benefits and alternatives. The patient understands, agrees and consents for the procedure. All questions were addressed. A time out was performed prior to the initiation of the procedure. Maximal barrier sterile technique utilized including caps, mask, sterile gowns, sterile gloves, large sterile drape, hand hygiene, and Betadine prep. The right groin was prepped and draped in standard fashion. Preprocedure ultrasound evaluation demonstrated patency of the right common femoral artery. A permanent image was captured and stored in the record. Subdermal Local anesthesia was provided at the planned needle entry site with 1% lidocaine. A small skin nick was made. Under direct ultrasound visualization, the right common femoral artery is accessed with standard micropuncture technique. A limited right lower extremity angiogram was performed which demonstrated adequate puncture site for closure device use. A J wire was directed to the abdominal aorta exchanged for a 5 French, 11 cm vascular sheath. A Mickelson catheter was then used to select the inferior mesenteric artery ostium. Inferior mesenteric angiogram was performed which demonstrated patency of the IMA. A Cook cantata microcatheter and a synchro  soft microwire were then inserted into the proximal IMA and directed to the superior rectal artery. Angiogram of the superior rectal artery was performed which demonstrated perfusion of the bilateral rectum without any evidence of active extravasation. There was a small focal focus of hyperemia about the rectum which could represent etiology of hemorrhage. With the catheter parked at the bifurcation of the proximal superior rectal artery, particle embolization was performed using 500-700 micron embospheres. Near complete stasis was achieved. The microcatheter was removed and the Mickelson catheter was redirected to select the left common iliac artery. The catheter was exchanged over Glidewire for a C2 catheter. The C2 catheter was used to select the internal iliac artery. Internal iliac angiogram was performed  which demonstrated patent anterior and posterior proximal branches and an inferior rectal artery arising from the proximal anterior division. Attempts at cannulating the inferior rectal artery were made with the microcatheter and microwire, however vessel spasm occurred in the inferior rectal artery was no longer visible. The microcatheter was removed and a Waltman loop was formed to redirect the C2 catheter into the right internal iliac artery. Angiogram of the right internal iliac artery demonstrated patent anterior posterior division with the inferior rectal artery arising from the proximal aspect of the anterior division. The microcatheter and wire used to select the anterior division of the internal iliac artery. Position was confirmed with limited angiogram. A Gel-Foam slurry was made and embolization of the anterior division of the right internal iliac artery was performed. Upon completion of embolization, there is sluggish pulsatile antegrade flow with pruning of the distal branches including the inferior rectal artery. The microcatheter was removed and the C2 catheter was redirected to the common  iliac artery and removed over a J wire. A 6 [place] Angio-Seal was then placed and deployed without complication or change in pulses. The patient tolerated the procedure well and remained stable throughout the procedure. The patient was transferred to the floor in stable condition. IMPRESSION: 1. Rectal hyperemia without evidence of active extravasation. 2. Prophylactic superior rectal artery particle embolization and Gelfoam embolization of the anterior division of the right common iliac artery Ruthann Cancer, MD [facility] Radiology Electronically Signed   By: Ruthann Cancer M.D.   On: 11/04/2021 13:14   IR US Guide Vasc Access Right  Result Date: 11/04/2021 INDICATION: 65 year old male with rectal hemorrhage status post recent transrectal prostate biopsy and status post right inferior rectal gelfoam embolization on 11/02/21 with recurrent hemorrhage and profound anemia. He refuses blood transfusion as he is Jehovah's Witness. Currently hemodynamically stable. EXAM: 1. Ultrasound-guided vascular access of the right common femoral artery. 2. Selective catheterization angiography of the inferior mesenteric artery, superior rectal artery, and anterior divisions of the bilateral internal iliac arteries 3. Particle embolization of the superior rectal artery. 4. Gel-Foam slurry embolization of the anterior division of the right internal iliac artery. MEDICATIONS: None. ANESTHESIA/SEDATION: Moderate (conscious) sedation was employed during this procedure. A total of Versed 2 mg and Fentanyl 100 mcg was administered intravenously. Moderate Sedation Time: 76 minutes. The patient's level of consciousness and vital signs were monitored continuously by radiology nursing throughout the procedure under my direct supervision. CONTRAST:  9mL OMNIPAQUE IOHEXOL 300 MG/ML SOLN, 104mL OMNIPAQUE IOHEXOL 300 MG/ML SOLN, 64mL OMNIPAQUE IOHEXOL 300 MG/ML SOLN FLUOROSCOPY TIME:   Fluoroscopy Time: 29 minutes 30 seconds (1,318 mGy). COMPLICATIONS: None immediate. PROCEDURE: Informed consent was obtained from the patient following explanation of the procedure, risks, benefits and alternatives. The patient understands, agrees and consents for the procedure. All questions were addressed. A time out was performed prior to the initiation of the procedure. Maximal barrier sterile technique utilized including caps, mask, sterile gowns, sterile gloves, large sterile drape, hand hygiene, and Betadine prep. The right groin was prepped and draped in standard fashion. Preprocedure ultrasound evaluation demonstrated patency of the right common femoral artery. A permanent image was captured and stored in the record. Subdermal Local anesthesia was provided at the planned needle entry site with 1% lidocaine. A small skin nick was made. Under direct ultrasound visualization, the right common femoral artery is accessed with standard micropuncture technique. A limited right lower extremity angiogram was performed which demonstrated adequate puncture site  for closure device use. A J wire was directed to the abdominal aorta exchanged for a 5 French, 11 cm vascular sheath. A Mickelson catheter was then used to select the inferior mesenteric artery ostium. Inferior mesenteric angiogram was performed which demonstrated patency of the IMA. A Cook cantata microcatheter and a synchro soft microwire were then inserted into the proximal IMA and directed to the superior rectal artery. Angiogram of the superior rectal artery was performed which demonstrated perfusion of the bilateral rectum without any evidence of active extravasation. There was a small focal focus of hyperemia about the rectum which could represent etiology of hemorrhage. With the catheter parked at the bifurcation of the proximal superior rectal artery, particle embolization was performed using 500-700 micron embospheres. Near complete stasis was  achieved. The microcatheter was removed and the Mickelson catheter was redirected to select the left common iliac artery. The catheter was exchanged over Glidewire for a C2 catheter. The C2 catheter was used to select the internal iliac artery. Internal iliac angiogram was performed which demonstrated patent anterior and posterior proximal branches and an inferior rectal artery arising from the proximal anterior division. Attempts at cannulating the inferior rectal artery were made with the microcatheter and microwire, however vessel spasm occurred in the inferior rectal artery was no longer visible. The microcatheter was removed and a Waltman loop was formed to redirect the C2 catheter into the right internal iliac artery. Angiogram of the right internal iliac artery demonstrated patent anterior posterior division with the inferior rectal artery arising from the proximal aspect of the anterior division. The microcatheter and wire used to select the anterior division of the internal iliac artery. Position was confirmed with limited angiogram. A Gel-Foam slurry was made and embolization of the anterior division of the right internal iliac artery was performed. Upon completion of embolization, there is sluggish pulsatile antegrade flow with pruning of the distal branches including the inferior rectal artery. The microcatheter was removed and the C2 catheter was redirected to the common iliac artery and removed over a J wire. A 6 [place] Angio-Seal was then placed and deployed without complication or change in pulses. The patient tolerated the procedure well and remained stable throughout the procedure. The patient was transferred to the floor in stable condition. IMPRESSION: 1. Rectal hyperemia without evidence of active extravasation. 2. Prophylactic superior rectal artery particle embolization and Gelfoam embolization of the anterior division of the right common iliac artery Ruthann Cancer, MD [facility] Radiology Electronically Signed   By: Ruthann Cancer M.D.   On: 11/04/2021 13:14   ECHOCARDIOGRAM COMPLETE  Result Date: 11/03/2021    ECHOCARDIOGRAM REPORT   Patient Name:   [name] Date of Exam: 11/03/2021 Medical Rec #:  [id number]       Height:       74.0 in Accession #:    [id number]      Weight:       250.0 lb Date of Birth:  03/20/1956       BSA:          2.390 m Patient Age:    65 years        BP:           118/73 mmHg Patient Gender: M               HR:           82 bpm. Exam Location:  Inpatient Procedure: 2D Echo, Color Doppler and Cardiac  Doppler Indications:    Chest pain  History:        Patient has prior history of Echocardiogram examinations. Risk                 Factors:Hypertension, Diabetes and Sleep Apnea.  Sonographer:    Jyl Heinz Referring Phys: [id number] [place]  1. Left ventricular ejection fraction, by estimation, is 65 to 70%. The left ventricle has normal function. The left ventricle has no regional wall motion abnormalities. There is mild concentric left ventricular hypertrophy. Left ventricular diastolic parameters are consistent with Grade I diastolic dysfunction (impaired relaxation).  2. Right ventricular systolic function is normal. The right ventricular size is normal. Tricuspid regurgitation signal is inadequate for assessing PA pressure.  3. The mitral valve is grossly normal. No evidence of mitral valve regurgitation. No evidence of mitral stenosis.  4. The aortic valve is tricuspid. Aortic valve regurgitation is not visualized. No aortic stenosis is present.  5. Aortic dilatation noted. There is mild dilatation of the aortic root, measuring 40 mm. Conclusion(s)/Recommendation(s): Normal biventricular function without evidence of hemodynamically significant valvular heart disease. FINDINGS  Left Ventricle: Left ventricular ejection fraction, by estimation, is 65 to 70%. The left ventricle has normal  function. The left ventricle has no regional wall motion abnormalities. The left ventricular internal cavity size was normal in size. There is  mild concentric left ventricular hypertrophy. Left ventricular diastolic parameters are consistent with Grade I diastolic dysfunction (impaired relaxation). Right Ventricle: The right ventricular size is normal. No increase in right ventricular wall thickness. Right ventricular systolic function is normal. Tricuspid regurgitation signal is inadequate for assessing PA pressure. Left Atrium: Left atrial size was normal in size. Right Atrium: Right atrial size was normal in size. Pericardium: There is no evidence of pericardial effusion. Mitral Valve: The mitral valve is grossly normal. Mild mitral annular calcification. No evidence of mitral valve regurgitation. No evidence of mitral valve stenosis. Tricuspid Valve: The tricuspid valve is grossly normal. Tricuspid valve regurgitation is trivial. No evidence of tricuspid stenosis. Aortic Valve: The aortic valve is tricuspid. Aortic valve regurgitation is not visualized. No aortic stenosis is present. Aortic valve mean gradient measures 10.0 mmHg. Aortic valve peak gradient measures 17.5 mmHg. Aortic valve area, by VTI measures 4.49 cm. Pulmonic Valve: The pulmonic valve was grossly normal. Pulmonic valve regurgitation is not visualized. No evidence of pulmonic stenosis. Aorta: Aortic dilatation noted. There is mild dilatation of the aortic root, measuring 40 mm. Venous: The inferior vena cava was not well visualized. IAS/Shunts: The atrial septum is grossly normal.  LEFT VENTRICLE PLAX 2D LVIDd:         4.10 cm     Diastology LVIDs:         2.50 cm     LV e' medial:    8.55 cm/s LV PW:         1.30 cm     LV E/e' medial:  10.2 LV IVS:        1.30 cm     LV e' lateral:   8.38 cm/s LVOT diam:     2.40 cm     LV E/e' lateral: 10.4 LV SV:         159 LV SV Index:   67 LVOT Area:     4.52 cm  LV Volumes (MOD) LV vol d, MOD A2C:  97.2 ml LV vol d, MOD A4C: 92.8 ml LV vol s, MOD A2C: 32.5 ml LV vol s,  MOD A4C: 33.3 ml LV SV MOD A2C:     64.7 ml LV SV MOD A4C:     92.8 ml LV SV MOD BP:      63.0 ml RIGHT VENTRICLE RV Basal diam:  2.90 cm RV Mid diam:    2.60 cm RV S prime:     12.60 cm/s TAPSE (M-mode): 2.5 cm LEFT ATRIUM             Index        RIGHT ATRIUM           Index LA diam:        3.20 cm 1.34 cm/m   RA Area:     18.70 cm LA Vol (A2C):   47.4 ml 19.84 ml/m  RA Volume:   54.50 ml  22.81 ml/m LA Vol (A4C):   36.8 ml 15.40 ml/m LA Biplane Vol: 43.5 ml 18.20 ml/m  AORTIC VALVE AV Area (Vmax):    3.96 cm AV Area (Vmean):   4.21 cm AV Area (VTI):     4.49 cm AV Vmax:           209.00 cm/s AV Vmean:          152.500 cm/s AV VTI:            0.355 m AV Peak Grad:      17.5 mmHg AV Mean Grad:      10.0 mmHg LVOT Vmax:         183.00 cm/s LVOT Vmean:        142.000 cm/s LVOT VTI:          0.352 m LVOT/AV VTI ratio: 0.99  AORTA Ao Root diam: 4.00 cm Ao Asc diam:  3.40 cm MITRAL VALVE MV Area (PHT): 3.36 cm     SHUNTS MV Decel Time: 226 msec     Systemic VTI:  0.35 m MV E velocity: 86.90 cm/s   Systemic Diam: 2.40 cm MV A velocity: 128.00 cm/s MV E/A ratio:  0.68 Eleonore Chiquito MD Electronically signed by Eleonore Chiquito MD Signature Date/Time: 11/03/2021/3:38:33 PM    Final    IR EMBO ART  VEN HEMORR LYMPH EXTRAV  INC GUIDE ROADMAPPING  Result Date: 11/04/2021 INDICATION: 65 year old male with rectal hemorrhage status post recent transrectal prostate biopsy and status post right inferior rectal gelfoam embolization on 11/02/21 with recurrent hemorrhage and profound anemia. He refuses blood transfusion as he is Jehovah's Witness. Currently hemodynamically stable. EXAM: 1. Ultrasound-guided vascular access of the right common femoral artery. 2. Selective catheterization angiography of the inferior mesenteric artery, superior rectal artery, and anterior divisions of the bilateral internal iliac arteries 3. Particle embolization of the  superior rectal artery. 4. Gel-Foam slurry embolization of the anterior division of the right internal iliac artery. MEDICATIONS: None. ANESTHESIA/SEDATION: Moderate (conscious) sedation was employed during this procedure. A total of Versed 2 mg and Fentanyl 100 mcg was administered intravenously. Moderate Sedation Time: 76 minutes. The patient's level of consciousness and vital signs were monitored continuously by radiology nursing throughout the procedure under my direct supervision. CONTRAST:  65mL OMNIPAQUE IOHEXOL 300 MG/ML SOLN, 51mL OMNIPAQUE IOHEXOL 300 MG/ML SOLN, 12mL OMNIPAQUE IOHEXOL 300 MG/ML SOLN FLUOROSCOPY TIME:  Fluoroscopy Time: 29 minutes 30 seconds (1,318 mGy). COMPLICATIONS: None immediate. PROCEDURE: Informed consent was obtained from the patient following explanation of the procedure, risks, benefits and alternatives. The patient understands, agrees and consents for the procedure. All questions were addressed. A time out was performed prior to  the initiation of the procedure. Maximal barrier sterile technique utilized including caps, mask, sterile gowns, sterile gloves, large sterile drape, hand hygiene, and Betadine prep. The right groin was prepped and draped in standard fashion. Preprocedure ultrasound evaluation demonstrated patency of the right common femoral artery. A permanent image was captured and stored in the record. Subdermal Local anesthesia was provided at the planned needle entry site with 1% lidocaine. A small skin nick was made. Under direct ultrasound visualization, the right common femoral artery is accessed with standard micropuncture technique. A limited right lower extremity angiogram was performed which demonstrated adequate puncture site for closure device use. A J wire was directed to the abdominal aorta exchanged for a 5 French, 11 cm vascular sheath. A Mickelson catheter was then used to select the inferior mesenteric artery ostium. Inferior mesenteric angiogram was  performed which demonstrated patency of the IMA. A Cook cantata microcatheter and a synchro soft microwire were then inserted into the proximal IMA and directed to the superior rectal artery. Angiogram of the superior rectal artery was performed which demonstrated perfusion of the bilateral rectum without any evidence of active extravasation. There was a small focal focus of hyperemia about the rectum which could represent etiology of hemorrhage. With the catheter parked at the bifurcation of the proximal superior rectal artery, particle embolization was performed using 500-700 micron embospheres. Near complete stasis was achieved. The microcatheter was removed and the Mickelson catheter was redirected to select the left common iliac artery. The catheter was exchanged over Glidewire for a C2 catheter. The C2 catheter was used to select the internal iliac artery. Internal iliac angiogram was performed which demonstrated patent anterior and posterior proximal branches and an inferior rectal artery arising from the proximal anterior division. Attempts at cannulating the inferior rectal artery were made with the microcatheter and microwire, however vessel spasm occurred in the inferior rectal artery was no longer visible. The microcatheter was removed and a Waltman loop was formed to redirect the C2 catheter into the right internal iliac artery. Angiogram of the right internal iliac artery demonstrated patent anterior posterior division with the inferior rectal artery arising from the proximal aspect of the anterior division. The microcatheter and wire used to select the anterior division of the internal iliac artery. Position was confirmed with limited angiogram. A Gel-Foam slurry was made and embolization of the anterior division of the right internal iliac artery was performed. Upon completion of embolization, there is sluggish pulsatile antegrade flow with pruning of the distal branches including the inferior rectal  artery. The microcatheter was removed and the C2 catheter was redirected to the common iliac artery and removed over a J wire. A 6 French Angio-Seal was then placed and deployed without complication or change in pulses. The patient tolerated the procedure well and remained stable throughout the procedure. The patient was transferred to the floor in stable condition. IMPRESSION: 1. Rectal hyperemia without evidence of active extravasation. 2. Prophylactic superior rectal artery particle embolization and Gelfoam embolization of the anterior division of the right common iliac artery Ruthann Cancer, MD [facility] Radiology Electronically Signed   By: Ruthann Cancer M.D.   On: 11/04/2021 13:14   IR EMBO ART  VEN HEMORR LYMPH EXTRAV  INC GUIDE ROADMAPPING  Result Date: 11/04/2021 INDICATION: 65 year old male with rectal hemorrhage status post recent transrectal prostate biopsy and status post right inferior rectal gelfoam embolization on 11/02/21 with recurrent hemorrhage and profound anemia. He refuses blood transfusion as  he is Jehovah's Witness. Currently hemodynamically stable. EXAM: 1. Ultrasound-guided vascular access of the right common femoral artery. 2. Selective catheterization angiography of the inferior mesenteric artery, superior rectal artery, and anterior divisions of the bilateral internal iliac arteries 3. Particle embolization of the superior rectal artery. 4. Gel-Foam slurry embolization of the anterior division of the right internal iliac artery. MEDICATIONS: None. ANESTHESIA/SEDATION: Moderate (conscious) sedation was employed during this procedure. A total of Versed 2 mg and Fentanyl 100 mcg was administered intravenously. Moderate Sedation Time: 76 minutes. The patient's level of consciousness and vital signs were monitored continuously by radiology nursing throughout the procedure under my direct supervision. CONTRAST:  49mL OMNIPAQUE IOHEXOL 300  MG/ML SOLN, 59mL OMNIPAQUE IOHEXOL 300 MG/ML SOLN, 47mL OMNIPAQUE IOHEXOL 300 MG/ML SOLN FLUOROSCOPY TIME:  Fluoroscopy Time: 29 minutes 30 seconds (1,318 mGy). COMPLICATIONS: None immediate. PROCEDURE: Informed consent was obtained from the patient following explanation of the procedure, risks, benefits and alternatives. The patient understands, agrees and consents for the procedure. All questions were addressed. A time out was performed prior to the initiation of the procedure. Maximal barrier sterile technique utilized including caps, mask, sterile gowns, sterile gloves, large sterile drape, hand hygiene, and Betadine prep. The right groin was prepped and draped in standard fashion. Preprocedure ultrasound evaluation demonstrated patency of the right common femoral artery. A permanent image was captured and stored in the record. Subdermal Local anesthesia was provided at the planned needle entry site with 1% lidocaine. A small skin nick was made. Under direct ultrasound visualization, the right common femoral artery is accessed with standard micropuncture technique. A limited right lower extremity angiogram was performed which demonstrated adequate puncture site for closure device use. A J wire was directed to the abdominal aorta exchanged for a 5 French, 11 cm vascular sheath. A Mickelson catheter was then used to select the inferior mesenteric artery ostium. Inferior mesenteric angiogram was performed which demonstrated patency of the IMA. A Cook cantata microcatheter and a synchro soft microwire were then inserted into the proximal IMA and directed to the superior rectal artery. Angiogram of the superior rectal artery was performed which demonstrated perfusion of the bilateral rectum without any evidence of active extravasation. There was a small focal focus of hyperemia about the rectum which could represent etiology of hemorrhage. With the catheter parked at the bifurcation of the proximal superior rectal  artery, particle embolization was performed using 500-700 micron embospheres. Near complete stasis was achieved. The microcatheter was removed and the Mickelson catheter was redirected to select the left common iliac artery. The catheter was exchanged over Glidewire for a C2 catheter. The C2 catheter was used to select the internal iliac artery. Internal iliac angiogram was performed which demonstrated patent anterior and posterior proximal branches and an inferior rectal artery arising from the proximal anterior division. Attempts at cannulating the inferior rectal artery were made with the microcatheter and microwire, however vessel spasm occurred in the inferior rectal artery was no longer visible. The microcatheter was removed and a Waltman loop was formed to redirect the C2 catheter into the right internal iliac artery. Angiogram of the right internal iliac artery demonstrated patent anterior posterior division with the inferior rectal artery arising from the proximal aspect of the anterior division. The microcatheter and wire used to select the anterior division of the internal iliac artery. Position was confirmed with limited angiogram. A Gel-Foam slurry was made and embolization of the anterior division of the right internal iliac artery was performed. Upon completion of  embolization, there is sluggish pulsatile antegrade flow with pruning of the distal branches including the inferior rectal artery. The microcatheter was removed and the C2 catheter was redirected to the common iliac artery and removed over a J wire. A 6 French Angio-Seal was then placed and deployed without complication or change in pulses. The patient tolerated the procedure well and remained stable throughout the procedure. The patient was transferred to the floor in stable condition. IMPRESSION: 1. Rectal hyperemia without evidence of active extravasation. 2. Prophylactic superior rectal artery particle embolization and Gelfoam  embolization of the anterior division of the right common iliac artery Ruthann Cancer, MD [facility] Radiology Electronically Signed   By: Ruthann Cancer M.D.   On: 11/04/2021 13:14    Anti-infectives: Anti-infectives (From admission, onward)    None        Assessment/Plan LGIB secondary to prostate biopsy -s/p IR embo x 2 -no further bleeding noted -given anemia, would likely limit blood draws to daily unless he begins to actively bleed again -may advance diet form our standpoint -no surgical needs at this time -defer further care to other rounding teams.  We will sign off at this time.   FEN - CLD VTE - on hold ID - none currently needed   LOS: 2 days    Henreitta Cea , [facility] Surgery 11/05/2021, 9:56 AM Please see Amion for pager number during day hours 7:00am-4:30pm or 7:00am -11:30am on weekends

## 2021-11-06 ENCOUNTER — Other Ambulatory Visit: Payer: Self-pay | Admitting: Hematology

## 2021-11-06 ENCOUNTER — Telehealth: Payer: Self-pay | Admitting: Hematology

## 2021-11-06 DIAGNOSIS — D62 Acute posthemorrhagic anemia: Secondary | ICD-10-CM

## 2021-11-06 DIAGNOSIS — K922 Gastrointestinal hemorrhage, unspecified: Secondary | ICD-10-CM | POA: Diagnosis not present

## 2021-11-06 DIAGNOSIS — R935 Abnormal findings on diagnostic imaging of other abdominal regions, including retroperitoneum: Secondary | ICD-10-CM | POA: Diagnosis not present

## 2021-11-06 DIAGNOSIS — E538 Deficiency of other specified B group vitamins: Secondary | ICD-10-CM | POA: Diagnosis not present

## 2021-11-06 DIAGNOSIS — K921 Melena: Secondary | ICD-10-CM | POA: Diagnosis not present

## 2021-11-06 DIAGNOSIS — E1169 Type 2 diabetes mellitus with other specified complication: Secondary | ICD-10-CM | POA: Diagnosis not present

## 2021-11-06 LAB — BASIC METABOLIC PANEL
Anion gap: 3 — ABNORMAL LOW (ref 5–15)
BUN: 10 mg/dL (ref 8–23)
CO2: 26 mmol/L (ref 22–32)
Calcium: 8.3 mg/dL — ABNORMAL LOW (ref 8.9–10.3)
Chloride: 105 mmol/L (ref 98–111)
Creatinine, Ser: 1.19 mg/dL (ref 0.61–1.24)
GFR, Estimated: >60 mL/min (ref >60)
Glucose, Bld: 129 mg/dL — ABNORMAL HIGH (ref 70–99)
Potassium: 3.6 mmol/L (ref 3.5–5.1)
Sodium: 134 mmol/L — ABNORMAL LOW (ref 135–145)

## 2021-11-06 LAB — GLUCOSE, CAPILLARY
Glucose-Capillary: 142 mg/dL — ABNORMAL HIGH (ref 70–99)
Glucose-Capillary: 144 mg/dL — ABNORMAL HIGH (ref 70–99)
Glucose-Capillary: 95 mg/dL (ref 70–99)

## 2021-11-06 LAB — CBC
HCT: 16.3 % — ABNORMAL LOW (ref 39.0–52.0)
Hemoglobin: 5.1 g/dL — CL (ref 13.0–17.0)
MCH: 22.7 pg — ABNORMAL LOW (ref 26.0–34.0)
MCHC: 31.3 g/dL (ref 30.0–36.0)
MCV: 72.4 fL — ABNORMAL LOW (ref 80.0–100.0)
Platelets: 148 10*3/uL — ABNORMAL LOW (ref 150–400)
RBC: 2.25 MIL/uL — ABNORMAL LOW (ref 4.22–5.81)
RDW: 16.5 % — ABNORMAL HIGH (ref 11.5–15.5)
WBC: 7.3 10*3/uL (ref 4.0–10.5)
nRBC: 1.7 % — ABNORMAL HIGH (ref 0.0–0.2)

## 2021-11-06 LAB — HEMOGLOBIN AND HEMATOCRIT, BLOOD
HCT: 21.3 % — ABNORMAL LOW (ref 39.0–52.0)
Hemoglobin: 6.6 g/dL — CL (ref 13.0–17.0)

## 2021-11-06 MED ORDER — DIPHENHYDRAMINE-ZINC ACETATE 2-0.1 % EX CREA
TOPICAL_CREAM | Freq: Once | CUTANEOUS | Status: AC
  Filled 2021-11-06: qty 28

## 2021-11-06 NOTE — Care Management Important Message (Signed)
Important Message  Patient Details IM Letter given to the Patient. Name: NAIF ALABI MRN: [id number] Date of Birth: [birth year]   Medicare Important Message Given:  Yes     [name] Salen 11/06/2021, 11:45 AM

## 2021-11-06 NOTE — Progress Notes (Signed)
Pt had BM this morning. He did not flush per RN request, but tissue paper covered BM so RN did not have good observation. Pt reported blood and perhaps a clot in stool but significantly less than when the bloody stools began. RN observed blood specks on inside toilet rim. Pt has had no dizziness or weakness while getting up.

## 2021-11-06 NOTE — Progress Notes (Signed)
PROGRESS NOTE    [name]  GXQ:[id number] DOB: [birth year] DOA: 11/02/2021 PCP: Biagio Borg, MD   Chief Complaint  Patient presents with   Rectal Bleeding    Brief Narrative:  65 year old gentleman(Jehovah's Witness), prior history of hypertension, hyperlipidemia, pulmonary embolism on Coumadin and Lovenox, type 2 diabetes, prostate cancer, GERD, recently underwent transrectal ultrasound-guided prostate biopsy on 10/26/2021 secondary to elevated PSA presents to ED with significant bright red blood per rectum, some lightheadedness and dizziness.  Patient denies any NSAID use.   Patient underwent reversal with vitamin K in the ER embolization on 11/02/2021.  Patient continues to have bright red blood per rectum.  GI was consulted.  IR consulted and patient underwent mesenteric and pelvic angiogram with particle embolization of superior rectal artery with Gelfoam embolization of anterior division of the right internal iliac artery.  Urology, oncology, gastroenterology, general surgery, IR on board and appreciate recommendations. Pt seen and examined at bedside .  But had a small BM with some old blood clots.  Pt reports some back pain.     Assessment & Plan:   Principal Problem:   GIB (gastrointestinal bleeding) Active Problems:   Hyperlipidemia   Depression   Essential hypertension   Pulmonary embolism (HCC)   Gross hematuria   Acute blood loss anemia   Esophageal reflux   History of pulmonary embolus (PE)   Diabetes mellitus type 2 in obese (HCC)   Chest pain   Hematochezia   Abnormal CT of the abdomen   Refusal of blood transfusions as patient is Jehovah's Witness  Acute GI bleed/acute blood loss anemia Probably secondary to lower rectal bleed as noted on the CT angiogram of the abdomen and pelvis. Hemoglobin around 11.5 on admission dropped to 4.9, improved to 6.7 , dropped to 5.2 to 5.1.   Patient is s/p mesenteric and pelvic angiogram with particle embolization  of superior rectal artery and Gelfoam embolization of the anterior addition of the right internal iliac artery. Patient is Jehovah's Witness and will not receive any blood products at this time. Hematology on board and recommending supportive care with IV iron, folic acid and X44 supplements. Hematology also recommending discontinuation of anticoagulation with Coumadin as patient's provoked PE/DVT was almost 15 years ago, without any recurrent clots.  He also received a dose of Epogen on 11/04/21.  Advanced diet as tolerated. Currently on soft diet. Recheck hemoglobin in am.     History of bilateral PE Patient noted to have provoked PE/DVT in 2007 and was started on anticoagulation. In light of the ongoing lower GI bleed, hematology recommending discontinuation of anticoagulation with Coumadin. Patient is agreeable to the plan.    New diagnosis of intermediate risk prostate cancer Urology on board and recommend outpatient follow-up. Pain control.     Chest pain Appears to have resolved. 2D echocardiogram showed left ventricular ejection fraction of 70% with no regional wall motion abnormalities, grade 1 diastolic dysfunction present.     Type 2 diabetes mellitus Hemoglobin A1c around 7.2 in October 2022 Continue with sliding scale insulin. CBG (last 3)  Recent Labs    11/05/21 2303 11/06/21 0541 11/06/21 1121  GLUCAP 128* 142* 144*   No changes in meds.      Essential hypertension Blood pressure parameters  are optimal.     Hyperlipidemia Continue with statin   Depression Patient was on Lexapro which is being held for now.   GERD Continue with PPI     DVT prophylaxis: SCDs Code Status:  Full code Family Communication: Family at bedside Disposition:   Status is: Inpatient  Remains inpatient appropriate because:  I f pt tolerated diet and no rectal bleeding he can be discharged tomorrow.        Consultants:  Gastroenterology General  surgery IR Dr. Serafina Royals  Procedures: IR EMBOLIZATION.   Antimicrobials: none.    Subjective: Bm with some old blood clots. Asymptomatic.   Objective: Vitals:   11/05/21 1501 11/05/21 2121 11/06/21 0543 11/06/21 1137  BP: 122/62 113/65 104/70 106/65  Pulse: 75 71 (!) 56 62  Resp: 16 16 18 16   Temp: 98.7 F (37.1 C) 98.5 F (36.9 C) 97.7 F (36.5 C) 98.4 F (36.9 C)  TempSrc: Oral Oral Oral Oral  SpO2: 100% 100% 94% 97%  Weight:      Height:        Intake/Output Summary (Last 24 hours) at 11/06/2021 1153 Last data filed at 11/06/2021 0650 Gross per 24 hour  Intake 1087.86 ml  Output 1560 ml  Net -472.14 ml    Filed Weights   11/02/21 0247 11/03/21 1917 11/04/21 0453  Weight: 113.4 kg 113.4 kg 122 kg    Examination:  General exam: Appears calm and comfortable  Respiratory system: Clear to auscultation. Respiratory effort normal. Cardiovascular system: S1 & S2 heard, RRR. No JVD,  No pedal edema. Gastrointestinal system: Abdomen is nondistended, soft and nontender. . Normal bowel sounds heard. Central nervous system: Alert and oriented. No focal neurological deficits. Extremities: Symmetric 5 x 5 power. Skin: No rashes, lesions or ulcers Psychiatry:  Mood & affect appropriate.       Data Reviewed: I have personally reviewed following labs and imaging studies  CBC: Recent Labs  Lab 11/02/21 0340 11/02/21 1224 11/03/21 0455 11/03/21 1142 11/04/21 1315 11/04/21 1843 11/05/21 0050 11/05/21 1055 11/06/21 0528  WBC 6.0  --  7.8  --   --   --   --   --  7.3  NEUTROABS 2.9  --   --   --   --   --   --   --   --   HGB 11.5*   < > 6.8*   < > 5.4* 4.9* 6.7* 5.2* 5.1*  HCT 37.7*   < > 22.2*   < > 17.1* 15.5* 21.6* 16.5* 16.3*  MCV 72.2*  --  73.3*  --   --   --   --   --  72.4*  PLT 191  --  146*  --   --   --   --   --  148*   < > = values in this interval not displayed.     Basic Metabolic Panel: Recent Labs  Lab 11/02/21 0340 11/03/21 0455  11/04/21 0501 11/06/21 0528  NA 139 134* 135 134*  K 4.9 4.5 4.0 3.6  CL 105 107 110 105  CO2 25 21* 24 26  GLUCOSE 101* 168* 117* 129*  BUN 31* 36* 25* 10  CREATININE 1.29* 1.67* 1.26* 1.19  CALCIUM 8.9 8.1* 7.6* 8.3*     GFR: Estimated Creatinine Clearance: 83.6 mL/min (by C-G formula based on SCr of 1.19 mg/dL).  Liver Function Tests: Recent Labs  Lab 11/02/21 0340 11/03/21 0455  AST 43* 28  ALT 34 27  ALKPHOS 48 37*  BILITOT 0.5 0.5  PROT 6.8 4.9*  ALBUMIN 4.0 3.1*     CBG: Recent Labs  Lab 11/05/21 1132 11/05/21 1801 11/05/21 2303 11/06/21 0541 11/06/21 1121  GLUCAP 185* 132* 128*  142* 144*      Recent Results (from the past 240 hour(s))  Resp Panel by RT-PCR (Flu A&B, Covid) Nasopharyngeal Swab     Status: None   Collection Time: 11/02/21  7:06 AM   Specimen: Nasopharyngeal Swab; Nasopharyngeal(NP) swabs in vial transport medium  Result Value Ref Range Status   SARS Coronavirus 2 by RT PCR NEGATIVE NEGATIVE Final    Comment: (NOTE) SARS-CoV-2 target nucleic acids are NOT DETECTED.  The SARS-CoV-2 RNA is generally detectable in upper respiratory specimens during the acute phase of infection. The lowest concentration of SARS-CoV-2 viral copies this assay can detect is 138 copies/mL. A negative result does not preclude SARS-Cov-2 infection and should not be used as the sole basis for treatment or other patient management decisions. A negative result may occur with  improper specimen collection/handling, submission of specimen other than nasopharyngeal swab, presence of viral mutation(s) within the areas targeted by this assay, and inadequate number of viral copies(<138 copies/mL). A negative result must be combined with clinical observations, patient history, and epidemiological information. The expected result is Negative.  Fact Sheet for Patients:  EntrepreneurPulse.com.au  Fact Sheet for Healthcare Providers:   IncredibleEmployment.be  This test is no t yet approved or cleared by the [place] FDA and  has been authorized for detection and/or diagnosis of SARS-CoV-2 by FDA under an Emergency Use Authorization (EUA). This EUA will remain  in effect (meaning this test can be used) for the duration of the COVID-19 declaration under Section 564(b)(1) of the Act, 21 U.S.C.section 360bbb-3(b)(1), unless the authorization is terminated  or revoked sooner.       Influenza A by PCR NEGATIVE NEGATIVE Final   Influenza B by PCR NEGATIVE NEGATIVE Final    Comment: (NOTE) The Xpert Xpress SARS-CoV-2/FLU/RSV plus assay is intended as an aid in the diagnosis of influenza from Nasopharyngeal swab specimens and should not be used as a sole basis for treatment. Nasal washings and aspirates are unacceptable for Xpert Xpress SARS-CoV-2/FLU/RSV testing.  Fact Sheet for Patients: EntrepreneurPulse.com.au  Fact Sheet for Healthcare Providers: IncredibleEmployment.be  This test is not yet approved or cleared by the [place] FDA and has been authorized for detection and/or diagnosis of SARS-CoV-2 by FDA under an Emergency Use Authorization (EUA). This EUA will remain in effect (meaning this test can be used) for the duration of the COVID-19 declaration under Section 564(b)(1) of the Act, 21 U.S.C. section 360bbb-3(b)(1), unless the authorization is terminated or revoked.  Performed at [facility], [place] [address], [place], [place] [address]   MRSA Next Gen by PCR, Nasal     Status: None   Collection Time: 11/02/21 12:53 PM   Specimen: Nasal Mucosa; Nasal Swab  Result Value Ref Range Status   MRSA by PCR Next Gen NOT DETECTED NOT DETECTED Final    Comment: (NOTE) The GeneXpert MRSA Assay (FDA approved for NASAL specimens only), is one component of a comprehensive MRSA colonization surveillance program. It is not intended  to diagnose MRSA infection nor to guide or monitor treatment for MRSA infections. Test performance is not FDA approved in patients less than 26 years old. Performed at [facility], [place] [address], [place], [place] [address]           Radiology Studies: No results found.      Scheduled Meds:  atorvastatin  80 mg Oral QHS   folic acid  1 mg Oral Daily   gabapentin  200 mg Oral QHS  insulin aspart  0-9 Units Subcutaneous Q6H   pantoprazole (PROTONIX) IV  40 mg Intravenous Q12H   sodium phosphate  1 enema Rectal Once   vitamin B-12  1,000 mcg Oral Daily   Continuous Infusions:  lactated ringers 10 mL/hr at 11/03/21 1919     LOS: 3 days        Hosie Poisson, MD [facility]   To contact the attending provider between 7A-7P or the covering provider during after hours 7P-7A, please log into the web site [email or web address] and access using universal [place] password for that web site. If you do not have the password, please call the hospital operator.  11/06/2021, 11:53 AM

## 2021-11-06 NOTE — Progress Notes (Addendum)
[facility] Progress Note  CC:  Acute lower GI  bleed     Subjective: He is tolerating a full liquid diet.  No abdominal pain no nausea or vomiting.  No chest pain, shortness of breath or dizziness. He passed a formed bowel movement this morning with 2 small darker red dime sized blood clots and a scant amount of bright red blood on the toilet tissue without recurrence.  His wife is at the bedside.   Objective:  Vital signs in last 24 hours: Temp:  [97.7 F (36.5 C)-98.7 F (37.1 C)] 97.7 F (36.5 C) (12/02 0543) Pulse Rate:  [56-75] 56 (12/02 0543) Resp:  [16-18] 18 (12/02 0543) BP: (104-122)/(62-70) 104/70 (12/02 0543) SpO2:  [94 %-100 %] 94 % (12/02 0543) Last BM Date: 11/03/21 General:  Alert 65 year old male no acute distress. Heart: Regular rate and rhythm, no murmurs. Pulm: Breath sounds clear throughout Abdomen: Soft, nondistended.  Nontender.  Positive bowel sounds all 4 quadrants. Extremities:  Without edema. Neurologic:  Alert and  oriented x4;  grossly normal neurologically. Psych:  Alert and cooperative. Normal mood and affect.  Intake/Output from previous day: 12/01 0701 - 12/02 0700 In: 1087.9 [I.V.:1014.5; IV Piggyback:73.3] Out: 1560 [Urine:1560] Intake/Output this shift: No intake/output data recorded.  Lab Results: Recent Labs    11/05/21 0050 11/05/21 1055 11/06/21 0528  WBC  --   --  7.3  HGB 6.7* 5.2* 5.1*  HCT 21.6* 16.5* 16.3*  PLT  --   --  148*   BMET Recent Labs    11/04/21 0501 11/06/21 0528  NA 135 134*  K 4.0 3.6  CL 110 105  CO2 24 26  GLUCOSE 117* 129*  BUN 25* 10  CREATININE 1.26* 1.19  CALCIUM 7.6* 8.3*   LFT No results for input(s): PROT, ALBUMIN, AST, ALT, ALKPHOS, BILITOT, BILIDIR, IBILI in the last 72 hours. PT/INR Recent Labs    11/03/21 2137  LABPROT 15.6*  INR 1.2   Hepatitis Panel No results for input(s): HEPBSAG, HCVAB, HEPAIGM, HEPBIGM in the last 72 hours.  No results  found.  Assessment / Plan:    11) 65 year old male admitted to the hospital 11/02/2021 with acute lower GI bleed likely from recent transrectal prostate biopsy with profound anemia while on Coumadin and Lovenox bridge. Admission INR 1.5 -> received Vitamin K. Admission Hg 11.5 -> 9.5 -> 6.7  -> 4.9 on 11/29.  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 Abd/pelvic CTA was + for acute lower rectal bleeding. S/P coil embolization of the right inferior rectal artery 11/28  by IR. He had recurrent active lower GI bleeding and underwent an urgent flex sig 12/29 which showed blood in the rectum and sigmoid, the area was lavaged without identifying the source of bleeding. IR was reconsulted and he underwent a prophylactic protocol embolization of the proximal superior rectal artery and prophylactic gelfoam slurry embolization of the anterior division of the right internal iliac artery was completed. Hg 4.9 -> Hg 6.7 -> Hg 5.2 -> today Hg down to 5.1. Received IV iron 11/29. He passed a BM with 2 dime size dark red blood clots and a scant amount of red blood on the toilet tissue this morning. He remains hemodynamically stable. -Full liquid diet, advance to soft diet later this afternoon if no further rectal bleeding occurs -Switch Pantoprazole 40 mg IV twice daily to p.o. twice daily -Continue to monitor patient closely for active GI bleed -CBC  in am -Further recommendations per Dr. Henrene Pastor   2) Prostate cancer, recently diagnosed s/p prostate biopsy 111/21/2022 -Urology following with recommendations to initiate Epogen  -Continue follow-up with oncologist Dr.Feng    Principal Problem:   GIB (gastrointestinal bleeding) Active Problems:   Hyperlipidemia   Depression   Essential hypertension   Pulmonary embolism ([place])   Gross hematuria   Acute blood loss anemia   Esophageal reflux   History of pulmonary embolus (PE)   Diabetes mellitus type 2 in obese (HCC)   Chest  pain   Hematochezia   Abnormal CT of the abdomen   Refusal of blood transfusions as patient is Jehovah's Witness     LOS: 3 days   [name]  11/06/2021, 10:21 AM  GI ATTENDING  Interval history data reviewed.  Patient personally seen and examined.  Agree with interval progress note as outlined friend at bedside.  Diminutive clot of blood with loose nonbloody stool this morning.  Otherwise states he feels well.  On clear liquids.  Once more substantive foods.  Hemoglobin 5.1.  Will advance diet.  If he is doing well tomorrow, okay for discharge.  Hopefully after interventional radiology embolization and off anticoagulation he will do well.  If you have any questions or problems, Dr. Benson Norway is on-call this weekend for group.  We will sign off at this time.  He does not need outpatient GI follow-up.  He should follow-up with his urologist.  Thanks  Docia Chuck. Geri Seminole., M.D. [facility] Division of Gastroenterology

## 2021-11-06 NOTE — Telephone Encounter (Signed)
Scheduled per sch msg. Called and left msg. Patient will get printout upon discharge

## 2021-11-06 NOTE — Progress Notes (Addendum)
HEMATOLOGY-ONCOLOGY PROGRESS NOTE  SUBJECTIVE: Reported a darker stool with blood clots and a scant amount of bright red blood on the toilet tissue overnight.  Otherwise, no other bleeding reported.  He denies dizziness, headaches, chest pain, shortness of breath.  REVIEW OF SYSTEMS:   Constitutional: Denies fevers, chills  Eyes: Denies blurriness of vision Ears, nose, mouth, throat, and face: Denies mucositis or sore throat Respiratory: Denies cough, dyspnea or wheezes Cardiovascular: Denies palpitation, chest discomfort Gastrointestinal:  Denies nausea, heartburn or change in bowel habits Skin: Denies abnormal skin rashes Lymphatics: Denies new lymphadenopathy or easy bruising Neurological:Denies numbness, tingling or new weaknesses Behavioral/Psych: Mood is stable, no new changes  Extremities: No lower extremity edema All other systems were reviewed with the patient and are negative.  I have reviewed the past medical history, past surgical history, social history and family history with the patient and they are unchanged from previous note.   PHYSICAL EXAMINATION:  Vitals:   11/05/21 2121 11/06/21 0543  BP: 113/65 104/70  Pulse: 71 (!) 56  Resp: 16 18  Temp: 98.5 F (36.9 C) 97.7 F (36.5 C)  SpO2: 100% 94%   Filed Weights   11/02/21 0247 11/03/21 1917 11/04/21 0453  Weight: 113.4 kg 113.4 kg 122 kg    Intake/Output from previous day: 12/01 0701 - 12/02 0700 In: 1087.9 [I.V.:1014.5; IV Piggyback:73.3] Out: 1560 [Urine:1560]  GENERAL:alert, no distress and comfortable SKIN: skin color, texture, turgor are normal, no rashes or significant lesions EYES: normal, Conjunctiva are pink and non-injected, sclera clear LUNGS: clear to auscultation and percussion with normal breathing effort HEART: regular rate & rhythm and no murmurs and no lower extremity edema ABDOMEN:abdomen soft, non-tender and normal bowel sounds NEURO: alert & oriented x 3 with fluent speech, no focal  motor/sensory deficits  LABORATORY DATA:  I have reviewed the data as listed CMP Latest Ref Rng & Units 11/06/2021 11/04/2021 11/03/2021  Glucose 70 - 99 mg/dL 129(H) 117(H) 168(H)  BUN 8 - 23 mg/dL 10 25(H) 36(H)  Creatinine 0.61 - 1.24 mg/dL 1.19 1.26(H) 1.67(H)  Sodium 135 - 145 mmol/L 134(L) 135 134(L)  Potassium 3.5 - 5.1 mmol/L 3.6 4.0 4.5  Chloride 98 - 111 mmol/L 105 110 107  CO2 22 - 32 mmol/L 26 24 21(L)  Calcium 8.9 - 10.3 mg/dL 8.3(L) 7.6(L) 8.1(L)  Total Protein 6.5 - 8.1 g/dL - - 4.9(L)  Total Bilirubin 0.3 - 1.2 mg/dL - - 0.5  Alkaline Phos 38 - 126 U/L - - 37(L)  AST 15 - 41 U/L - - 28  ALT 0 - 44 U/L - - 27    Lab Results  Component Value Date   WBC 7.3 11/06/2021   HGB 5.1 (LL) 11/06/2021   HCT 16.3 (L) 11/06/2021   MCV 72.4 (L) 11/06/2021   PLT 148 (L) 11/06/2021   NEUTROABS 2.9 11/02/2021    DG Lumbar Spine 2-3 Views  Result Date: 10/22/2021 CLINICAL DATA:  Lumbar spine pain. EXAM: LUMBAR SPINE - 2-3 VIEW COMPARISON:  Lumbar spine x-ray 11/22/2017 FINDINGS: Alignment is anatomic. There is no evidence for acute fracture. There is moderate disc space narrowing at L4-L5 and L5-S1 which is similar to the prior study. There are atherosclerotic calcifications of the aorta. There is a surgical clip in the left abdomen. There is a large amount of stool in the distal colon and rectum. IMPRESSION: Stable moderate degenerative changes at L4-L5 and L5-S1. Large amount of stool in the distal colon and rectum. Electronically Signed  By: Ronney Asters M.D.   On: 10/22/2021 20:43   IR Angiogram Visceral Selective  Result Date: 11/04/2021 INDICATION: 65 year old male with rectal hemorrhage status post recent transrectal prostate biopsy and status post right inferior rectal gelfoam embolization on 11/02/21 with recurrent hemorrhage and profound anemia. He refuses blood transfusion as he is Jehovah's Witness. Currently hemodynamically stable. EXAM: 1. Ultrasound-guided vascular  access of the right common femoral artery. 2. Selective catheterization angiography of the inferior mesenteric artery, superior rectal artery, and anterior divisions of the bilateral internal iliac arteries 3. Particle embolization of the superior rectal artery. 4. Gel-Foam slurry embolization of the anterior division of the right internal iliac artery. MEDICATIONS: None. ANESTHESIA/SEDATION: Moderate (conscious) sedation was employed during this procedure. A total of Versed 2 mg and Fentanyl 100 mcg was administered intravenously. Moderate Sedation Time: 76 minutes. The patient's level of consciousness and vital signs were monitored continuously by radiology nursing throughout the procedure under my direct supervision. CONTRAST:  70mL OMNIPAQUE IOHEXOL 300 MG/ML SOLN, 58mL OMNIPAQUE IOHEXOL 300 MG/ML SOLN, 82mL OMNIPAQUE IOHEXOL 300 MG/ML SOLN FLUOROSCOPY TIME:  Fluoroscopy Time: 29 minutes 30 seconds (1,318 mGy). COMPLICATIONS: None immediate. PROCEDURE: Informed consent was obtained from the patient following explanation of the procedure, risks, benefits and alternatives. The patient understands, agrees and consents for the procedure. All questions were addressed. A time out was performed prior to the initiation of the procedure. Maximal barrier sterile technique utilized including caps, mask, sterile gowns, sterile gloves, large sterile drape, hand hygiene, and Betadine prep. The right groin was prepped and draped in standard fashion. Preprocedure ultrasound evaluation demonstrated patency of the right common femoral artery. A permanent image was captured and stored in the record. Subdermal Local anesthesia was provided at the planned needle entry site with 1% lidocaine. A small skin nick was made. Under direct ultrasound visualization, the right common femoral artery is accessed with standard micropuncture technique. A limited right lower extremity angiogram was performed which demonstrated adequate puncture  site for closure device use. A J wire was directed to the abdominal aorta exchanged for a 5 French, 11 cm vascular sheath. A Mickelson catheter was then used to select the inferior mesenteric artery ostium. Inferior mesenteric angiogram was performed which demonstrated patency of the IMA. A Cook cantata microcatheter and a synchro soft microwire were then inserted into the proximal IMA and directed to the superior rectal artery. Angiogram of the superior rectal artery was performed which demonstrated perfusion of the bilateral rectum without any evidence of active extravasation. There was a small focal focus of hyperemia about the rectum which could represent etiology of hemorrhage. With the catheter parked at the bifurcation of the proximal superior rectal artery, particle embolization was performed using 500-700 micron embospheres. Near complete stasis was achieved. The microcatheter was removed and the Mickelson catheter was redirected to select the left common iliac artery. The catheter was exchanged over Glidewire for a C2 catheter. The C2 catheter was used to select the internal iliac artery. Internal iliac angiogram was performed which demonstrated patent anterior and posterior proximal branches and an inferior rectal artery arising from the proximal anterior division. Attempts at cannulating the inferior rectal artery were made with the microcatheter and microwire, however vessel spasm occurred in the inferior rectal artery was no longer visible. The microcatheter was removed and a Waltman loop was formed to redirect the C2 catheter into the right internal iliac artery. Angiogram of the right internal iliac artery demonstrated patent anterior posterior division with the  inferior rectal artery arising from the proximal aspect of the anterior division. The microcatheter and wire used to select the anterior division of the internal iliac artery. Position was confirmed with limited angiogram. A Gel-Foam slurry  was made and embolization of the anterior division of the right internal iliac artery was performed. Upon completion of embolization, there is sluggish pulsatile antegrade flow with pruning of the distal branches including the inferior rectal artery. The microcatheter was removed and the C2 catheter was redirected to the common iliac artery and removed over a J wire. A 6 French Angio-Seal was then placed and deployed without complication or change in pulses. The patient tolerated the procedure well and remained stable throughout the procedure. The patient was transferred to the floor in stable condition. IMPRESSION: 1. Rectal hyperemia without evidence of active extravasation. 2. Prophylactic superior rectal artery particle embolization and Gelfoam embolization of the anterior division of the right common iliac artery Ruthann Cancer, MD [facility] Radiology Electronically Signed   By: Ruthann Cancer M.D.   On: 11/04/2021 13:14   IR Angiogram Visceral Selective  Result Date: 11/02/2021 INDICATION: 65 year old male status post recent transrectal prostate biopsy presenting with hematochezia, anemia, and CT arteriogram evidence of acute hemorrhage from the rectum. EXAM: 1. Ultrasound-guided vascular access of the right common femoral artery. 2. Abdominal aortogram. 3. Selective catheterization and angiography of the inferior mesenteric artery, superior rectal artery, bilateral internal iliac arteries, and bilateral inferior rectal arteries 4. Gel-Foam embolization of the distal right inferior rectal artery MEDICATIONS: None. ANESTHESIA/SEDATION: Moderate (conscious) sedation was employed during this procedure. A total of Versed 0 mg and Fentanyl 0 mcg was administered intravenously. Moderate Sedation Time: 0 minutes. The patient's level of consciousness and vital signs were monitored continuously by radiology nursing throughout the procedure under my direct  supervision. CONTRAST:  33mL OMNIPAQUE IOHEXOL 300 MG/ML SOLN, 75mL OMNIPAQUE IOHEXOL 300 MG/ML SOLN, 6mL OMNIPAQUE IOHEXOL 300 MG/ML SOLN FLUOROSCOPY TIME:  Fluoroscopy Time: 31 minutes 12 seconds (2,778 mGy). COMPLICATIONS: None immediate. PROCEDURE: Informed consent was obtained from the patient following explanation of the procedure, risks, benefits and alternatives. The patient understands, agrees and consents for the procedure. All questions were addressed. A time out was performed prior to the initiation of the procedure. Maximal barrier sterile technique utilized including caps, mask, sterile gowns, sterile gloves, large sterile drape, hand hygiene, and Betadine prep. The right groin was prepped and draped in standard fashion. Preprocedure ultrasound evaluation demonstrated patency of the right common femoral artery. A permanent image was captured and stored in the record. Subdermal Local anesthesia was provided at the planned needle entry site with 1% lidocaine. A small skin nick was made. Under direct ultrasound visualization, the right common femoral artery was accessed with standard micropuncture technique. A limited right lower extremity angiogram was performed which demonstrated adequate puncture site for closure device use. A J wire was directed to the abdominal aorta and exchanged for a 5 French, 11 cm vascular sheath. A RIM catheter was introduced and was attempted to be used to engage the IMA however was unsuccessful. Abdominal aortogram was then performed in a steep obliquity with a pigtail flush catheter to demonstrate the ostium of the IMA. The pigtail catheter was then exchanged for a Mickelson catheter which successfully engaged the IMA ostium. A Cook cantata microcatheter and a synchro soft microwire were then inserted into the proximal IMA. IMA angiogram was then performed which demonstrated patency of the superior rectal artery and its branches.  There is mild hyperemia in the rectum  without definite evidence of active extravasation. The microcatheter was removed and the Mickelson catheter was redirected to select the left common iliac artery. The catheter was exchanged over a Glidewire for a C2 catheter. The C2 catheter was uses likely internal iliac artery. Internal iliac angiogram was performed which demonstrated patent anterior and posterior proximal branches and in inferior rectal artery arising from the proximal anterior division. The microcatheter and wire were then used to select the inferior rectal artery proximally. Angiogram was performed which demonstrated no evidence of active extravasation. A Waltman loop was formed and the C2 catheter was used to select the right internal iliac artery. Angiogram of the right internal iliac artery demonstrated patent anterior posterior division within inferior rectal artery arising from the proximal aspect of the anterior division. The microcatheter and wire were used to select the inferior rectal artery. Angiogram demonstrated a possible focus of extravasation arising from a more superior branch of the distal inferior rectal artery. The distal branch was selected with the microcatheter. Additional angiogram was performed which demonstrated no evidence of active extravasation spilling into the rectum, however there was hyperemia and irregularity of the vessels in this region. Therefore, Gel-Foam embolization was performed using approximately 1 mL of Gel-Foam slurry. The catheter was retracted slightly and additional angiogram was performed which demonstrated complete embolization of the distal inferior rectal artery and its branches. No persistent hyperemia was observed. The microcatheter was removed. The C2 catheter was advanced to un formed a Waltman loop and removed. A 6 French Angio-Seal was then placed and deployed without complication or change in pulses. The patient tolerated the procedure well and remained stable throughout the procedure.  The patient was transferred to the floor in stable condition. IMPRESSION: 1. Possible focal extravasation versus hyperemia about a distal branch of the right inferior rectal artery. 2. Successful Gel-Foam slurry embolization of the distal right inferior rectal artery. Ruthann Cancer, MD [facility] Radiology Electronically Signed   By: Ruthann Cancer M.D.   On: 11/02/2021 16:47   IR Angiogram Pelvis Selective Or Supraselective  Result Date: 11/04/2021 INDICATION: 65 year old male with rectal hemorrhage status post recent transrectal prostate biopsy and status post right inferior rectal gelfoam embolization on 11/02/21 with recurrent hemorrhage and profound anemia. He refuses blood transfusion as he is Jehovah's Witness. Currently hemodynamically stable. EXAM: 1. Ultrasound-guided vascular access of the right common femoral artery. 2. Selective catheterization angiography of the inferior mesenteric artery, superior rectal artery, and anterior divisions of the bilateral internal iliac arteries 3. Particle embolization of the superior rectal artery. 4. Gel-Foam slurry embolization of the anterior division of the right internal iliac artery. MEDICATIONS: None. ANESTHESIA/SEDATION: Moderate (conscious) sedation was employed during this procedure. A total of Versed 2 mg and Fentanyl 100 mcg was administered intravenously. Moderate Sedation Time: 76 minutes. The patient's level of consciousness and vital signs were monitored continuously by radiology nursing throughout the procedure under my direct supervision. CONTRAST:  75mL OMNIPAQUE IOHEXOL 300 MG/ML SOLN, 69mL OMNIPAQUE IOHEXOL 300 MG/ML SOLN, 53mL OMNIPAQUE IOHEXOL 300 MG/ML SOLN FLUOROSCOPY TIME:  Fluoroscopy Time: 29 minutes 30 seconds (1,318 mGy). COMPLICATIONS: None immediate. PROCEDURE: Informed consent was obtained from the patient following explanation of the procedure, risks, benefits and alternatives. The  patient understands, agrees and consents for the procedure. All questions were addressed. A time out was performed prior to the initiation of the procedure. Maximal barrier sterile technique utilized including caps, mask, sterile  gowns, sterile gloves, large sterile drape, hand hygiene, and Betadine prep. The right groin was prepped and draped in standard fashion. Preprocedure ultrasound evaluation demonstrated patency of the right common femoral artery. A permanent image was captured and stored in the record. Subdermal Local anesthesia was provided at the planned needle entry site with 1% lidocaine. A small skin nick was made. Under direct ultrasound visualization, the right common femoral artery is accessed with standard micropuncture technique. A limited right lower extremity angiogram was performed which demonstrated adequate puncture site for closure device use. A J wire was directed to the abdominal aorta exchanged for a 5 French, 11 cm vascular sheath. A Mickelson catheter was then used to select the inferior mesenteric artery ostium. Inferior mesenteric angiogram was performed which demonstrated patency of the IMA. A Cook cantata microcatheter and a synchro soft microwire were then inserted into the proximal IMA and directed to the superior rectal artery. Angiogram of the superior rectal artery was performed which demonstrated perfusion of the bilateral rectum without any evidence of active extravasation. There was a small focal focus of hyperemia about the rectum which could represent etiology of hemorrhage. With the catheter parked at the bifurcation of the proximal superior rectal artery, particle embolization was performed using 500-700 micron embospheres. Near complete stasis was achieved. The microcatheter was removed and the Mickelson catheter was redirected to select the left common iliac artery. The catheter was exchanged over Glidewire for a C2 catheter. The C2 catheter was used to select the  internal iliac artery. Internal iliac angiogram was performed which demonstrated patent anterior and posterior proximal branches and an inferior rectal artery arising from the proximal anterior division. Attempts at cannulating the inferior rectal artery were made with the microcatheter and microwire, however vessel spasm occurred in the inferior rectal artery was no longer visible. The microcatheter was removed and a Waltman loop was formed to redirect the C2 catheter into the right internal iliac artery. Angiogram of the right internal iliac artery demonstrated patent anterior posterior division with the inferior rectal artery arising from the proximal aspect of the anterior division. The microcatheter and wire used to select the anterior division of the internal iliac artery. Position was confirmed with limited angiogram. A Gel-Foam slurry was made and embolization of the anterior division of the right internal iliac artery was performed. Upon completion of embolization, there is sluggish pulsatile antegrade flow with pruning of the distal branches including the inferior rectal artery. The microcatheter was removed and the C2 catheter was redirected to the common iliac artery and removed over a J wire. A 6 French Angio-Seal was then placed and deployed without complication or change in pulses. The patient tolerated the procedure well and remained stable throughout the procedure. The patient was transferred to the floor in stable condition. IMPRESSION: 1. Rectal hyperemia without evidence of active extravasation. 2. Prophylactic superior rectal artery particle embolization and Gelfoam embolization of the anterior division of the right common iliac artery Ruthann Cancer, MD [facility] Radiology Electronically Signed   By: Ruthann Cancer M.D.   On: 11/04/2021 13:14   IR Angiogram Pelvis Selective Or Supraselective  Result Date: 11/02/2021 INDICATION: 65 year old  male status post recent transrectal prostate biopsy presenting with hematochezia, anemia, and CT arteriogram evidence of acute hemorrhage from the rectum. EXAM: 1. Ultrasound-guided vascular access of the right common femoral artery. 2. Abdominal aortogram. 3. Selective catheterization and angiography of the inferior mesenteric artery, superior rectal artery, bilateral internal iliac arteries, and  bilateral inferior rectal arteries 4. Gel-Foam embolization of the distal right inferior rectal artery MEDICATIONS: None. ANESTHESIA/SEDATION: Moderate (conscious) sedation was employed during this procedure. A total of Versed 0 mg and Fentanyl 0 mcg was administered intravenously. Moderate Sedation Time: 0 minutes. The patient's level of consciousness and vital signs were monitored continuously by radiology nursing throughout the procedure under my direct supervision. CONTRAST:  31mL OMNIPAQUE IOHEXOL 300 MG/ML SOLN, 81mL OMNIPAQUE IOHEXOL 300 MG/ML SOLN, 19mL OMNIPAQUE IOHEXOL 300 MG/ML SOLN FLUOROSCOPY TIME:  Fluoroscopy Time: 31 minutes 12 seconds (2,778 mGy). COMPLICATIONS: None immediate. PROCEDURE: Informed consent was obtained from the patient following explanation of the procedure, risks, benefits and alternatives. The patient understands, agrees and consents for the procedure. All questions were addressed. A time out was performed prior to the initiation of the procedure. Maximal barrier sterile technique utilized including caps, mask, sterile gowns, sterile gloves, large sterile drape, hand hygiene, and Betadine prep. The right groin was prepped and draped in standard fashion. Preprocedure ultrasound evaluation demonstrated patency of the right common femoral artery. A permanent image was captured and stored in the record. Subdermal Local anesthesia was provided at the planned needle entry site with 1% lidocaine. A small skin nick was made. Under direct ultrasound visualization, the right common femoral artery  was accessed with standard micropuncture technique. A limited right lower extremity angiogram was performed which demonstrated adequate puncture site for closure device use. A J wire was directed to the abdominal aorta and exchanged for a 5 French, 11 cm vascular sheath. A RIM catheter was introduced and was attempted to be used to engage the IMA however was unsuccessful. Abdominal aortogram was then performed in a steep obliquity with a pigtail flush catheter to demonstrate the ostium of the IMA. The pigtail catheter was then exchanged for a Mickelson catheter which successfully engaged the IMA ostium. A Cook cantata microcatheter and a synchro soft microwire were then inserted into the proximal IMA. IMA angiogram was then performed which demonstrated patency of the superior rectal artery and its branches. There is mild hyperemia in the rectum without definite evidence of active extravasation. The microcatheter was removed and the Mickelson catheter was redirected to select the left common iliac artery. The catheter was exchanged over a Glidewire for a C2 catheter. The C2 catheter was uses likely internal iliac artery. Internal iliac angiogram was performed which demonstrated patent anterior and posterior proximal branches and in inferior rectal artery arising from the proximal anterior division. The microcatheter and wire were then used to select the inferior rectal artery proximally. Angiogram was performed which demonstrated no evidence of active extravasation. A Waltman loop was formed and the C2 catheter was used to select the right internal iliac artery. Angiogram of the right internal iliac artery demonstrated patent anterior posterior division within inferior rectal artery arising from the proximal aspect of the anterior division. The microcatheter and wire were used to select the inferior rectal artery. Angiogram demonstrated a possible focus of extravasation arising from a more superior branch of the  distal inferior rectal artery. The distal branch was selected with the microcatheter. Additional angiogram was performed which demonstrated no evidence of active extravasation spilling into the rectum, however there was hyperemia and irregularity of the vessels in this region. Therefore, Gel-Foam embolization was performed using approximately 1 mL of Gel-Foam slurry. The catheter was retracted slightly and additional angiogram was performed which demonstrated complete embolization of the distal inferior rectal artery and its branches. No persistent hyperemia was observed. The microcatheter  was removed. The C2 catheter was advanced to un formed a Waltman loop and removed. A 6 French Angio-Seal was then placed and deployed without complication or change in pulses. The patient tolerated the procedure well and remained stable throughout the procedure. The patient was transferred to the floor in stable condition. IMPRESSION: 1. Possible focal extravasation versus hyperemia about a distal branch of the right inferior rectal artery. 2. Successful Gel-Foam slurry embolization of the distal right inferior rectal artery. Ruthann Cancer, MD [facility] Radiology Electronically Signed   By: Ruthann Cancer M.D.   On: 11/02/2021 16:47   IR Angiogram Selective Each Additional Vessel  Result Date: 11/04/2021 INDICATION: 65 year old male with rectal hemorrhage status post recent transrectal prostate biopsy and status post right inferior rectal gelfoam embolization on 11/02/21 with recurrent hemorrhage and profound anemia. He refuses blood transfusion as he is Jehovah's Witness. Currently hemodynamically stable. EXAM: 1. Ultrasound-guided vascular access of the right common femoral artery. 2. Selective catheterization angiography of the inferior mesenteric artery, superior rectal artery, and anterior divisions of the bilateral internal iliac arteries 3. Particle embolization of the  superior rectal artery. 4. Gel-Foam slurry embolization of the anterior division of the right internal iliac artery. MEDICATIONS: None. ANESTHESIA/SEDATION: Moderate (conscious) sedation was employed during this procedure. A total of Versed 2 mg and Fentanyl 100 mcg was administered intravenously. Moderate Sedation Time: 76 minutes. The patient's level of consciousness and vital signs were monitored continuously by radiology nursing throughout the procedure under my direct supervision. CONTRAST:  21mL OMNIPAQUE IOHEXOL 300 MG/ML SOLN, 75mL OMNIPAQUE IOHEXOL 300 MG/ML SOLN, 110mL OMNIPAQUE IOHEXOL 300 MG/ML SOLN FLUOROSCOPY TIME:  Fluoroscopy Time: 29 minutes 30 seconds (1,318 mGy). COMPLICATIONS: None immediate. PROCEDURE: Informed consent was obtained from the patient following explanation of the procedure, risks, benefits and alternatives. The patient understands, agrees and consents for the procedure. All questions were addressed. A time out was performed prior to the initiation of the procedure. Maximal barrier sterile technique utilized including caps, mask, sterile gowns, sterile gloves, large sterile drape, hand hygiene, and Betadine prep. The right groin was prepped and draped in standard fashion. Preprocedure ultrasound evaluation demonstrated patency of the right common femoral artery. A permanent image was captured and stored in the record. Subdermal Local anesthesia was provided at the planned needle entry site with 1% lidocaine. A small skin nick was made. Under direct ultrasound visualization, the right common femoral artery is accessed with standard micropuncture technique. A limited right lower extremity angiogram was performed which demonstrated adequate puncture site for closure device use. A J wire was directed to the abdominal aorta exchanged for a 5 French, 11 cm vascular sheath. A Mickelson catheter was then used to select the inferior mesenteric artery ostium. Inferior mesenteric angiogram was  performed which demonstrated patency of the IMA. A Cook cantata microcatheter and a synchro soft microwire were then inserted into the proximal IMA and directed to the superior rectal artery. Angiogram of the superior rectal artery was performed which demonstrated perfusion of the bilateral rectum without any evidence of active extravasation. There was a small focal focus of hyperemia about the rectum which could represent etiology of hemorrhage. With the catheter parked at the bifurcation of the proximal superior rectal artery, particle embolization was performed using 500-700 micron embospheres. Near complete stasis was achieved. The microcatheter was removed and the Mickelson catheter was redirected to select the left common iliac artery. The catheter was exchanged over Glidewire for a C2 catheter.  The C2 catheter was used to select the internal iliac artery. Internal iliac angiogram was performed which demonstrated patent anterior and posterior proximal branches and an inferior rectal artery arising from the proximal anterior division. Attempts at cannulating the inferior rectal artery were made with the microcatheter and microwire, however vessel spasm occurred in the inferior rectal artery was no longer visible. The microcatheter was removed and a Waltman loop was formed to redirect the C2 catheter into the right internal iliac artery. Angiogram of the right internal iliac artery demonstrated patent anterior posterior division with the inferior rectal artery arising from the proximal aspect of the anterior division. The microcatheter and wire used to select the anterior division of the internal iliac artery. Position was confirmed with limited angiogram. A Gel-Foam slurry was made and embolization of the anterior division of the right internal iliac artery was performed. Upon completion of embolization, there is sluggish pulsatile antegrade flow with pruning of the distal branches including the inferior rectal  artery. The microcatheter was removed and the C2 catheter was redirected to the common iliac artery and removed over a J wire. A 6 French Angio-Seal was then placed and deployed without complication or change in pulses. The patient tolerated the procedure well and remained stable throughout the procedure. The patient was transferred to the floor in stable condition. IMPRESSION: 1. Rectal hyperemia without evidence of active extravasation. 2. Prophylactic superior rectal artery particle embolization and Gelfoam embolization of the anterior division of the right common iliac artery Ruthann Cancer, MD [facility] Radiology Electronically Signed   By: Ruthann Cancer M.D.   On: 11/04/2021 13:14   IR Angiogram Selective Each Additional Vessel  Result Date: 11/04/2021 INDICATION: 65 year old male with rectal hemorrhage status post recent transrectal prostate biopsy and status post right inferior rectal gelfoam embolization on 11/02/21 with recurrent hemorrhage and profound anemia. He refuses blood transfusion as he is Jehovah's Witness. Currently hemodynamically stable. EXAM: 1. Ultrasound-guided vascular access of the right common femoral artery. 2. Selective catheterization angiography of the inferior mesenteric artery, superior rectal artery, and anterior divisions of the bilateral internal iliac arteries 3. Particle embolization of the superior rectal artery. 4. Gel-Foam slurry embolization of the anterior division of the right internal iliac artery. MEDICATIONS: None. ANESTHESIA/SEDATION: Moderate (conscious) sedation was employed during this procedure. A total of Versed 2 mg and Fentanyl 100 mcg was administered intravenously. Moderate Sedation Time: 76 minutes. The patient's level of consciousness and vital signs were monitored continuously by radiology nursing throughout the procedure under my direct supervision. CONTRAST:  52mL OMNIPAQUE IOHEXOL 300 MG/ML SOLN,  57mL OMNIPAQUE IOHEXOL 300 MG/ML SOLN, 30mL OMNIPAQUE IOHEXOL 300 MG/ML SOLN FLUOROSCOPY TIME:  Fluoroscopy Time: 29 minutes 30 seconds (1,318 mGy). COMPLICATIONS: None immediate. PROCEDURE: Informed consent was obtained from the patient following explanation of the procedure, risks, benefits and alternatives. The patient understands, agrees and consents for the procedure. All questions were addressed. A time out was performed prior to the initiation of the procedure. Maximal barrier sterile technique utilized including caps, mask, sterile gowns, sterile gloves, large sterile drape, hand hygiene, and Betadine prep. The right groin was prepped and draped in standard fashion. Preprocedure ultrasound evaluation demonstrated patency of the right common femoral artery. A permanent image was captured and stored in the record. Subdermal Local anesthesia was provided at the planned needle entry site with 1% lidocaine. A small skin nick was made. Under direct ultrasound visualization, the right common femoral artery is accessed with  standard micropuncture technique. A limited right lower extremity angiogram was performed which demonstrated adequate puncture site for closure device use. A J wire was directed to the abdominal aorta exchanged for a 5 French, 11 cm vascular sheath. A Mickelson catheter was then used to select the inferior mesenteric artery ostium. Inferior mesenteric angiogram was performed which demonstrated patency of the IMA. A Cook cantata microcatheter and a synchro soft microwire were then inserted into the proximal IMA and directed to the superior rectal artery. Angiogram of the superior rectal artery was performed which demonstrated perfusion of the bilateral rectum without any evidence of active extravasation. There was a small focal focus of hyperemia about the rectum which could represent etiology of hemorrhage. With the catheter parked at the bifurcation of the proximal superior rectal artery,  particle embolization was performed using 500-700 micron embospheres. Near complete stasis was achieved. The microcatheter was removed and the Mickelson catheter was redirected to select the left common iliac artery. The catheter was exchanged over Glidewire for a C2 catheter. The C2 catheter was used to select the internal iliac artery. Internal iliac angiogram was performed which demonstrated patent anterior and posterior proximal branches and an inferior rectal artery arising from the proximal anterior division. Attempts at cannulating the inferior rectal artery were made with the microcatheter and microwire, however vessel spasm occurred in the inferior rectal artery was no longer visible. The microcatheter was removed and a Waltman loop was formed to redirect the C2 catheter into the right internal iliac artery. Angiogram of the right internal iliac artery demonstrated patent anterior posterior division with the inferior rectal artery arising from the proximal aspect of the anterior division. The microcatheter and wire used to select the anterior division of the internal iliac artery. Position was confirmed with limited angiogram. A Gel-Foam slurry was made and embolization of the anterior division of the right internal iliac artery was performed. Upon completion of embolization, there is sluggish pulsatile antegrade flow with pruning of the distal branches including the inferior rectal artery. The microcatheter was removed and the C2 catheter was redirected to the common iliac artery and removed over a J wire. A 6 French Angio-Seal was then placed and deployed without complication or change in pulses. The patient tolerated the procedure well and remained stable throughout the procedure. The patient was transferred to the floor in stable condition. IMPRESSION: 1. Rectal hyperemia without evidence of active extravasation. 2. Prophylactic superior rectal artery particle embolization and Gelfoam embolization of  the anterior division of the right common iliac artery Ruthann Cancer, MD [facility] Radiology Electronically Signed   By: Ruthann Cancer M.D.   On: 11/04/2021 13:14   IR Angiogram Selective Each Additional Vessel  Result Date: 11/04/2021 INDICATION: 65 year old male with rectal hemorrhage status post recent transrectal prostate biopsy and status post right inferior rectal gelfoam embolization on 11/02/21 with recurrent hemorrhage and profound anemia. He refuses blood transfusion as he is Jehovah's Witness. Currently hemodynamically stable. EXAM: 1. Ultrasound-guided vascular access of the right common femoral artery. 2. Selective catheterization angiography of the inferior mesenteric artery, superior rectal artery, and anterior divisions of the bilateral internal iliac arteries 3. Particle embolization of the superior rectal artery. 4. Gel-Foam slurry embolization of the anterior division of the right internal iliac artery. MEDICATIONS: None. ANESTHESIA/SEDATION: Moderate (conscious) sedation was employed during this procedure. A total of Versed 2 mg and Fentanyl 100 mcg was administered intravenously. Moderate Sedation Time: 76 minutes. The patient's level of  consciousness and vital signs were monitored continuously by radiology nursing throughout the procedure under my direct supervision. CONTRAST:  65mL OMNIPAQUE IOHEXOL 300 MG/ML SOLN, 40mL OMNIPAQUE IOHEXOL 300 MG/ML SOLN, 58mL OMNIPAQUE IOHEXOL 300 MG/ML SOLN FLUOROSCOPY TIME:  Fluoroscopy Time: 29 minutes 30 seconds (1,318 mGy). COMPLICATIONS: None immediate. PROCEDURE: Informed consent was obtained from the patient following explanation of the procedure, risks, benefits and alternatives. The patient understands, agrees and consents for the procedure. All questions were addressed. A time out was performed prior to the initiation of the procedure. Maximal barrier sterile technique utilized including caps,  mask, sterile gowns, sterile gloves, large sterile drape, hand hygiene, and Betadine prep. The right groin was prepped and draped in standard fashion. Preprocedure ultrasound evaluation demonstrated patency of the right common femoral artery. A permanent image was captured and stored in the record. Subdermal Local anesthesia was provided at the planned needle entry site with 1% lidocaine. A small skin nick was made. Under direct ultrasound visualization, the right common femoral artery is accessed with standard micropuncture technique. A limited right lower extremity angiogram was performed which demonstrated adequate puncture site for closure device use. A J wire was directed to the abdominal aorta exchanged for a 5 French, 11 cm vascular sheath. A Mickelson catheter was then used to select the inferior mesenteric artery ostium. Inferior mesenteric angiogram was performed which demonstrated patency of the IMA. A Cook cantata microcatheter and a synchro soft microwire were then inserted into the proximal IMA and directed to the superior rectal artery. Angiogram of the superior rectal artery was performed which demonstrated perfusion of the bilateral rectum without any evidence of active extravasation. There was a small focal focus of hyperemia about the rectum which could represent etiology of hemorrhage. With the catheter parked at the bifurcation of the proximal superior rectal artery, particle embolization was performed using 500-700 micron embospheres. Near complete stasis was achieved. The microcatheter was removed and the Mickelson catheter was redirected to select the left common iliac artery. The catheter was exchanged over Glidewire for a C2 catheter. The C2 catheter was used to select the internal iliac artery. Internal iliac angiogram was performed which demonstrated patent anterior and posterior proximal branches and an inferior rectal artery arising from the proximal anterior division. Attempts at  cannulating the inferior rectal artery were made with the microcatheter and microwire, however vessel spasm occurred in the inferior rectal artery was no longer visible. The microcatheter was removed and a Waltman loop was formed to redirect the C2 catheter into the right internal iliac artery. Angiogram of the right internal iliac artery demonstrated patent anterior posterior division with the inferior rectal artery arising from the proximal aspect of the anterior division. The microcatheter and wire used to select the anterior division of the internal iliac artery. Position was confirmed with limited angiogram. A Gel-Foam slurry was made and embolization of the anterior division of the right internal iliac artery was performed. Upon completion of embolization, there is sluggish pulsatile antegrade flow with pruning of the distal branches including the inferior rectal artery. The microcatheter was removed and the C2 catheter was redirected to the common iliac artery and removed over a J wire. A 6 French Angio-Seal was then placed and deployed without complication or change in pulses. The patient tolerated the procedure well and remained stable throughout the procedure. The patient was transferred to the floor in stable condition. IMPRESSION: 1. Rectal hyperemia without evidence of active extravasation. 2. Prophylactic superior rectal artery particle embolization and Gelfoam  embolization of the anterior division of the right common iliac artery Ruthann Cancer, MD [facility] Radiology Electronically Signed   By: Ruthann Cancer M.D.   On: 11/04/2021 13:14   IR Angiogram Selective Each Additional Vessel  Result Date: 11/04/2021 INDICATION: 65 year old male with rectal hemorrhage status post recent transrectal prostate biopsy and status post right inferior rectal gelfoam embolization on 11/02/21 with recurrent hemorrhage and profound anemia. He refuses blood  transfusion as he is Jehovah's Witness. Currently hemodynamically stable. EXAM: 1. Ultrasound-guided vascular access of the right common femoral artery. 2. Selective catheterization angiography of the inferior mesenteric artery, superior rectal artery, and anterior divisions of the bilateral internal iliac arteries 3. Particle embolization of the superior rectal artery. 4. Gel-Foam slurry embolization of the anterior division of the right internal iliac artery. MEDICATIONS: None. ANESTHESIA/SEDATION: Moderate (conscious) sedation was employed during this procedure. A total of Versed 2 mg and Fentanyl 100 mcg was administered intravenously. Moderate Sedation Time: 76 minutes. The patient's level of consciousness and vital signs were monitored continuously by radiology nursing throughout the procedure under my direct supervision. CONTRAST:  54mL OMNIPAQUE IOHEXOL 300 MG/ML SOLN, 44mL OMNIPAQUE IOHEXOL 300 MG/ML SOLN, 75mL OMNIPAQUE IOHEXOL 300 MG/ML SOLN FLUOROSCOPY TIME:  Fluoroscopy Time: 29 minutes 30 seconds (1,318 mGy). COMPLICATIONS: None immediate. PROCEDURE: Informed consent was obtained from the patient following explanation of the procedure, risks, benefits and alternatives. The patient understands, agrees and consents for the procedure. All questions were addressed. A time out was performed prior to the initiation of the procedure. Maximal barrier sterile technique utilized including caps, mask, sterile gowns, sterile gloves, large sterile drape, hand hygiene, and Betadine prep. The right groin was prepped and draped in standard fashion. Preprocedure ultrasound evaluation demonstrated patency of the right common femoral artery. A permanent image was captured and stored in the record. Subdermal Local anesthesia was provided at the planned needle entry site with 1% lidocaine. A small skin nick was made. Under direct ultrasound visualization, the right common femoral artery is accessed with standard  micropuncture technique. A limited right lower extremity angiogram was performed which demonstrated adequate puncture site for closure device use. A J wire was directed to the abdominal aorta exchanged for a 5 French, 11 cm vascular sheath. A Mickelson catheter was then used to select the inferior mesenteric artery ostium. Inferior mesenteric angiogram was performed which demonstrated patency of the IMA. A Cook cantata microcatheter and a synchro soft microwire were then inserted into the proximal IMA and directed to the superior rectal artery. Angiogram of the superior rectal artery was performed which demonstrated perfusion of the bilateral rectum without any evidence of active extravasation. There was a small focal focus of hyperemia about the rectum which could represent etiology of hemorrhage. With the catheter parked at the bifurcation of the proximal superior rectal artery, particle embolization was performed using 500-700 micron embospheres. Near complete stasis was achieved. The microcatheter was removed and the Mickelson catheter was redirected to select the left common iliac artery. The catheter was exchanged over Glidewire for a C2 catheter. The C2 catheter was used to select the internal iliac artery. Internal iliac angiogram was performed which demonstrated patent anterior and posterior proximal branches and an inferior rectal artery arising from the proximal anterior division. Attempts at cannulating the inferior rectal artery were made with the microcatheter and microwire, however vessel spasm occurred in the inferior rectal artery was no longer visible. The microcatheter was removed and a Waltman loop  was formed to redirect the C2 catheter into the right internal iliac artery. Angiogram of the right internal iliac artery demonstrated patent anterior posterior division with the inferior rectal artery arising from the proximal aspect of the anterior division. The microcatheter and wire used to select  the anterior division of the internal iliac artery. Position was confirmed with limited angiogram. A Gel-Foam slurry was made and embolization of the anterior division of the right internal iliac artery was performed. Upon completion of embolization, there is sluggish pulsatile antegrade flow with pruning of the distal branches including the inferior rectal artery. The microcatheter was removed and the C2 catheter was redirected to the common iliac artery and removed over a J wire. A 6 French Angio-Seal was then placed and deployed without complication or change in pulses. The patient tolerated the procedure well and remained stable throughout the procedure. The patient was transferred to the floor in stable condition. IMPRESSION: 1. Rectal hyperemia without evidence of active extravasation. 2. Prophylactic superior rectal artery particle embolization and Gelfoam embolization of the anterior division of the right common iliac artery Ruthann Cancer, MD [facility] Radiology Electronically Signed   By: Ruthann Cancer M.D.   On: 11/04/2021 13:14   IR Angiogram Selective Each Additional Vessel  Result Date: 11/02/2021 INDICATION: 65 year old male status post recent transrectal prostate biopsy presenting with hematochezia, anemia, and CT arteriogram evidence of acute hemorrhage from the rectum. EXAM: 1. Ultrasound-guided vascular access of the right common femoral artery. 2. Abdominal aortogram. 3. Selective catheterization and angiography of the inferior mesenteric artery, superior rectal artery, bilateral internal iliac arteries, and bilateral inferior rectal arteries 4. Gel-Foam embolization of the distal right inferior rectal artery MEDICATIONS: None. ANESTHESIA/SEDATION: Moderate (conscious) sedation was employed during this procedure. A total of Versed 0 mg and Fentanyl 0 mcg was administered intravenously. Moderate Sedation Time: 0 minutes. The patient's level of  consciousness and vital signs were monitored continuously by radiology nursing throughout the procedure under my direct supervision. CONTRAST:  27mL OMNIPAQUE IOHEXOL 300 MG/ML SOLN, 4mL OMNIPAQUE IOHEXOL 300 MG/ML SOLN, 87mL OMNIPAQUE IOHEXOL 300 MG/ML SOLN FLUOROSCOPY TIME:  Fluoroscopy Time: 31 minutes 12 seconds (2,778 mGy). COMPLICATIONS: None immediate. PROCEDURE: Informed consent was obtained from the patient following explanation of the procedure, risks, benefits and alternatives. The patient understands, agrees and consents for the procedure. All questions were addressed. A time out was performed prior to the initiation of the procedure. Maximal barrier sterile technique utilized including caps, mask, sterile gowns, sterile gloves, large sterile drape, hand hygiene, and Betadine prep. The right groin was prepped and draped in standard fashion. Preprocedure ultrasound evaluation demonstrated patency of the right common femoral artery. A permanent image was captured and stored in the record. Subdermal Local anesthesia was provided at the planned needle entry site with 1% lidocaine. A small skin nick was made. Under direct ultrasound visualization, the right common femoral artery was accessed with standard micropuncture technique. A limited right lower extremity angiogram was performed which demonstrated adequate puncture site for closure device use. A J wire was directed to the abdominal aorta and exchanged for a 5 French, 11 cm vascular sheath. A RIM catheter was introduced and was attempted to be used to engage the IMA however was unsuccessful. Abdominal aortogram was then performed in a steep obliquity with a pigtail flush catheter to demonstrate the ostium of the IMA. The pigtail catheter was then exchanged for a Mickelson catheter which successfully engaged the IMA ostium. A Cook cantata  microcatheter and a synchro soft microwire were then inserted into the proximal IMA. IMA angiogram was then performed  which demonstrated patency of the superior rectal artery and its branches. There is mild hyperemia in the rectum without definite evidence of active extravasation. The microcatheter was removed and the Mickelson catheter was redirected to select the left common iliac artery. The catheter was exchanged over a Glidewire for a C2 catheter. The C2 catheter was uses likely internal iliac artery. Internal iliac angiogram was performed which demonstrated patent anterior and posterior proximal branches and in inferior rectal artery arising from the proximal anterior division. The microcatheter and wire were then used to select the inferior rectal artery proximally. Angiogram was performed which demonstrated no evidence of active extravasation. A Waltman loop was formed and the C2 catheter was used to select the right internal iliac artery. Angiogram of the right internal iliac artery demonstrated patent anterior posterior division within inferior rectal artery arising from the proximal aspect of the anterior division. The microcatheter and wire were used to select the inferior rectal artery. Angiogram demonstrated a possible focus of extravasation arising from a more superior branch of the distal inferior rectal artery. The distal branch was selected with the microcatheter. Additional angiogram was performed which demonstrated no evidence of active extravasation spilling into the rectum, however there was hyperemia and irregularity of the vessels in this region. Therefore, Gel-Foam embolization was performed using approximately 1 mL of Gel-Foam slurry. The catheter was retracted slightly and additional angiogram was performed which demonstrated complete embolization of the distal inferior rectal artery and its branches. No persistent hyperemia was observed. The microcatheter was removed. The C2 catheter was advanced to un formed a Waltman loop and removed. A 6 French Angio-Seal was then placed and deployed without  complication or change in pulses. The patient tolerated the procedure well and remained stable throughout the procedure. The patient was transferred to the floor in stable condition. IMPRESSION: 1. Possible focal extravasation versus hyperemia about a distal branch of the right inferior rectal artery. 2. Successful Gel-Foam slurry embolization of the distal right inferior rectal artery. Ruthann Cancer, MD [facility] Radiology Electronically Signed   By: Ruthann Cancer M.D.   On: 11/02/2021 16:47   IR Angiogram Selective Each Additional Vessel  Result Date: 11/02/2021 INDICATION: 65 year old male status post recent transrectal prostate biopsy presenting with hematochezia, anemia, and CT arteriogram evidence of acute hemorrhage from the rectum. EXAM: 1. Ultrasound-guided vascular access of the right common femoral artery. 2. Abdominal aortogram. 3. Selective catheterization and angiography of the inferior mesenteric artery, superior rectal artery, bilateral internal iliac arteries, and bilateral inferior rectal arteries 4. Gel-Foam embolization of the distal right inferior rectal artery MEDICATIONS: None. ANESTHESIA/SEDATION: Moderate (conscious) sedation was employed during this procedure. A total of Versed 0 mg and Fentanyl 0 mcg was administered intravenously. Moderate Sedation Time: 0 minutes. The patient's level of consciousness and vital signs were monitored continuously by radiology nursing throughout the procedure under my direct supervision. CONTRAST:  23mL OMNIPAQUE IOHEXOL 300 MG/ML SOLN, 37mL OMNIPAQUE IOHEXOL 300 MG/ML SOLN, 52mL OMNIPAQUE IOHEXOL 300 MG/ML SOLN FLUOROSCOPY TIME:  Fluoroscopy Time: 31 minutes 12 seconds (2,778 mGy). COMPLICATIONS: None immediate. PROCEDURE: Informed consent was obtained from the patient following explanation of the procedure, risks, benefits and alternatives. The patient understands, agrees and consents for the  procedure. All questions were addressed. A time out was performed prior to the initiation of the procedure. Maximal barrier sterile technique utilized including caps,  mask, sterile gowns, sterile gloves, large sterile drape, hand hygiene, and Betadine prep. The right groin was prepped and draped in standard fashion. Preprocedure ultrasound evaluation demonstrated patency of the right common femoral artery. A permanent image was captured and stored in the record. Subdermal Local anesthesia was provided at the planned needle entry site with 1% lidocaine. A small skin nick was made. Under direct ultrasound visualization, the right common femoral artery was accessed with standard micropuncture technique. A limited right lower extremity angiogram was performed which demonstrated adequate puncture site for closure device use. A J wire was directed to the abdominal aorta and exchanged for a 5 French, 11 cm vascular sheath. A RIM catheter was introduced and was attempted to be used to engage the IMA however was unsuccessful. Abdominal aortogram was then performed in a steep obliquity with a pigtail flush catheter to demonstrate the ostium of the IMA. The pigtail catheter was then exchanged for a Mickelson catheter which successfully engaged the IMA ostium. A Cook cantata microcatheter and a synchro soft microwire were then inserted into the proximal IMA. IMA angiogram was then performed which demonstrated patency of the superior rectal artery and its branches. There is mild hyperemia in the rectum without definite evidence of active extravasation. The microcatheter was removed and the Mickelson catheter was redirected to select the left common iliac artery. The catheter was exchanged over a Glidewire for a C2 catheter. The C2 catheter was uses likely internal iliac artery. Internal iliac angiogram was performed which demonstrated patent anterior and posterior proximal branches and in inferior rectal artery arising from the  proximal anterior division. The microcatheter and wire were then used to select the inferior rectal artery proximally. Angiogram was performed which demonstrated no evidence of active extravasation. A Waltman loop was formed and the C2 catheter was used to select the right internal iliac artery. Angiogram of the right internal iliac artery demonstrated patent anterior posterior division within inferior rectal artery arising from the proximal aspect of the anterior division. The microcatheter and wire were used to select the inferior rectal artery. Angiogram demonstrated a possible focus of extravasation arising from a more superior branch of the distal inferior rectal artery. The distal branch was selected with the microcatheter. Additional angiogram was performed which demonstrated no evidence of active extravasation spilling into the rectum, however there was hyperemia and irregularity of the vessels in this region. Therefore, Gel-Foam embolization was performed using approximately 1 mL of Gel-Foam slurry. The catheter was retracted slightly and additional angiogram was performed which demonstrated complete embolization of the distal inferior rectal artery and its branches. No persistent hyperemia was observed. The microcatheter was removed. The C2 catheter was advanced to un formed a Waltman loop and removed. A 6 French Angio-Seal was then placed and deployed without complication or change in pulses. The patient tolerated the procedure well and remained stable throughout the procedure. The patient was transferred to the floor in stable condition. IMPRESSION: 1. Possible focal extravasation versus hyperemia about a distal branch of the right inferior rectal artery. 2. Successful Gel-Foam slurry embolization of the distal right inferior rectal artery. Ruthann Cancer, MD [facility] Radiology Electronically Signed   By: Ruthann Cancer M.D.   On: 11/02/2021 16:47   IR  Angiogram Selective Each Additional Vessel  Result Date: 11/02/2021 INDICATION: 65 year old male status post recent transrectal prostate biopsy presenting with hematochezia, anemia, and CT arteriogram evidence of acute hemorrhage from the rectum. EXAM: 1. Ultrasound-guided vascular access of the  right common femoral artery. 2. Abdominal aortogram. 3. Selective catheterization and angiography of the inferior mesenteric artery, superior rectal artery, bilateral internal iliac arteries, and bilateral inferior rectal arteries 4. Gel-Foam embolization of the distal right inferior rectal artery MEDICATIONS: None. ANESTHESIA/SEDATION: Moderate (conscious) sedation was employed during this procedure. A total of Versed 0 mg and Fentanyl 0 mcg was administered intravenously. Moderate Sedation Time: 0 minutes. The patient's level of consciousness and vital signs were monitored continuously by radiology nursing throughout the procedure under my direct supervision. CONTRAST:  67mL OMNIPAQUE IOHEXOL 300 MG/ML SOLN, 38mL OMNIPAQUE IOHEXOL 300 MG/ML SOLN, 51mL OMNIPAQUE IOHEXOL 300 MG/ML SOLN FLUOROSCOPY TIME:  Fluoroscopy Time: 31 minutes 12 seconds (2,778 mGy). COMPLICATIONS: None immediate. PROCEDURE: Informed consent was obtained from the patient following explanation of the procedure, risks, benefits and alternatives. The patient understands, agrees and consents for the procedure. All questions were addressed. A time out was performed prior to the initiation of the procedure. Maximal barrier sterile technique utilized including caps, mask, sterile gowns, sterile gloves, large sterile drape, hand hygiene, and Betadine prep. The right groin was prepped and draped in standard fashion. Preprocedure ultrasound evaluation demonstrated patency of the right common femoral artery. A permanent image was captured and stored in the record. Subdermal Local anesthesia was provided at the planned needle entry site with 1% lidocaine. A  small skin nick was made. Under direct ultrasound visualization, the right common femoral artery was accessed with standard micropuncture technique. A limited right lower extremity angiogram was performed which demonstrated adequate puncture site for closure device use. A J wire was directed to the abdominal aorta and exchanged for a 5 French, 11 cm vascular sheath. A RIM catheter was introduced and was attempted to be used to engage the IMA however was unsuccessful. Abdominal aortogram was then performed in a steep obliquity with a pigtail flush catheter to demonstrate the ostium of the IMA. The pigtail catheter was then exchanged for a Mickelson catheter which successfully engaged the IMA ostium. A Cook cantata microcatheter and a synchro soft microwire were then inserted into the proximal IMA. IMA angiogram was then performed which demonstrated patency of the superior rectal artery and its branches. There is mild hyperemia in the rectum without definite evidence of active extravasation. The microcatheter was removed and the Mickelson catheter was redirected to select the left common iliac artery. The catheter was exchanged over a Glidewire for a C2 catheter. The C2 catheter was uses likely internal iliac artery. Internal iliac angiogram was performed which demonstrated patent anterior and posterior proximal branches and in inferior rectal artery arising from the proximal anterior division. The microcatheter and wire were then used to select the inferior rectal artery proximally. Angiogram was performed which demonstrated no evidence of active extravasation. A Waltman loop was formed and the C2 catheter was used to select the right internal iliac artery. Angiogram of the right internal iliac artery demonstrated patent anterior posterior division within inferior rectal artery arising from the proximal aspect of the anterior division. The microcatheter and wire were used to select the inferior rectal artery.  Angiogram demonstrated a possible focus of extravasation arising from a more superior branch of the distal inferior rectal artery. The distal branch was selected with the microcatheter. Additional angiogram was performed which demonstrated no evidence of active extravasation spilling into the rectum, however there was hyperemia and irregularity of the vessels in this region. Therefore, Gel-Foam embolization was performed using approximately 1 mL of Gel-Foam slurry. The catheter was retracted slightly  and additional angiogram was performed which demonstrated complete embolization of the distal inferior rectal artery and its branches. No persistent hyperemia was observed. The microcatheter was removed. The C2 catheter was advanced to un formed a Waltman loop and removed. A 6 French Angio-Seal was then placed and deployed without complication or change in pulses. The patient tolerated the procedure well and remained stable throughout the procedure. The patient was transferred to the floor in stable condition. IMPRESSION: 1. Possible focal extravasation versus hyperemia about a distal branch of the right inferior rectal artery. 2. Successful Gel-Foam slurry embolization of the distal right inferior rectal artery. Ruthann Cancer, MD [facility] Radiology Electronically Signed   By: Ruthann Cancer M.D.   On: 11/02/2021 16:47   IR US Guide Vasc Access Right  Result Date: 11/04/2021 INDICATION: 65 year old male with rectal hemorrhage status post recent transrectal prostate biopsy and status post right inferior rectal gelfoam embolization on 11/02/21 with recurrent hemorrhage and profound anemia. He refuses blood transfusion as he is [employer]'s [employer]. Currently hemodynamically stable. EXAM: 1. Ultrasound-guided vascular access of the right common femoral artery. 2. Selective catheterization angiography of the inferior mesenteric artery, superior rectal artery, and anterior  divisions of the bilateral internal iliac arteries 3. Particle embolization of the superior rectal artery. 4. Gel-Foam slurry embolization of the anterior division of the right internal iliac artery. MEDICATIONS: None. ANESTHESIA/SEDATION: Moderate (conscious) sedation was employed during this procedure. A total of Versed 2 mg and Fentanyl 100 mcg was administered intravenously. Moderate Sedation Time: 76 minutes. The patient's level of consciousness and vital signs were monitored continuously by radiology nursing throughout the procedure under my direct supervision. CONTRAST:  62mL OMNIPAQUE IOHEXOL 300 MG/ML SOLN, 2mL OMNIPAQUE IOHEXOL 300 MG/ML SOLN, 55mL OMNIPAQUE IOHEXOL 300 MG/ML SOLN FLUOROSCOPY TIME:  Fluoroscopy Time: 29 minutes 30 seconds (1,318 mGy). COMPLICATIONS: None immediate. PROCEDURE: Informed consent was obtained from the patient following explanation of the procedure, risks, benefits and alternatives. The patient understands, agrees and consents for the procedure. All questions were addressed. A time out was performed prior to the initiation of the procedure. Maximal barrier sterile technique utilized including caps, mask, sterile gowns, sterile gloves, large sterile drape, hand hygiene, and Betadine prep. The right groin was prepped and draped in standard fashion. Preprocedure ultrasound evaluation demonstrated patency of the right common femoral artery. A permanent image was captured and stored in the record. Subdermal Local anesthesia was provided at the planned needle entry site with 1% lidocaine. A small skin nick was made. Under direct ultrasound visualization, the right common femoral artery is accessed with standard micropuncture technique. A limited right lower extremity angiogram was performed which demonstrated adequate puncture site for closure device use. A J wire was directed to the abdominal aorta exchanged for a 5 French, 11 cm vascular sheath. A Mickelson catheter was then used  to select the inferior mesenteric artery ostium. Inferior mesenteric angiogram was performed which demonstrated patency of the IMA. A Cook cantata microcatheter and a synchro soft microwire were then inserted into the proximal IMA and directed to the superior rectal artery. Angiogram of the superior rectal artery was performed which demonstrated perfusion of the bilateral rectum without any evidence of active extravasation. There was a small focal focus of hyperemia about the rectum which could represent etiology of hemorrhage. With the catheter parked at the bifurcation of the proximal superior rectal artery, particle embolization was performed using 500-700 micron embospheres. Near complete stasis was achieved. The microcatheter  was removed and the Mickelson catheter was redirected to select the left common iliac artery. The catheter was exchanged over Glidewire for a C2 catheter. The C2 catheter was used to select the internal iliac artery. Internal iliac angiogram was performed which demonstrated patent anterior and posterior proximal branches and an inferior rectal artery arising from the proximal anterior division. Attempts at cannulating the inferior rectal artery were made with the microcatheter and microwire, however vessel spasm occurred in the inferior rectal artery was no longer visible. The microcatheter was removed and a Waltman loop was formed to redirect the C2 catheter into the right internal iliac artery. Angiogram of the right internal iliac artery demonstrated patent anterior posterior division with the inferior rectal artery arising from the proximal aspect of the anterior division. The microcatheter and wire used to select the anterior division of the internal iliac artery. Position was confirmed with limited angiogram. A Gel-Foam slurry was made and embolization of the anterior division of the right internal iliac artery was performed. Upon completion of embolization, there is sluggish  pulsatile antegrade flow with pruning of the distal branches including the inferior rectal artery. The microcatheter was removed and the C2 catheter was redirected to the common iliac artery and removed over a J wire. A 6 French Angio-Seal was then placed and deployed without complication or change in pulses. The patient tolerated the procedure well and remained stable throughout the procedure. The patient was transferred to the floor in stable condition. IMPRESSION: 1. Rectal hyperemia without evidence of active extravasation. 2. Prophylactic superior rectal artery particle embolization and Gelfoam embolization of the anterior division of the right common iliac artery Ruthann Cancer, MD [facility] Radiology Electronically Signed   By: Ruthann Cancer M.D.   On: 11/04/2021 13:14   IR US Guide Vasc Access Right  Result Date: 11/02/2021 INDICATION: 65 year old male status post recent transrectal prostate biopsy presenting with hematochezia, anemia, and CT arteriogram evidence of acute hemorrhage from the rectum. EXAM: 1. Ultrasound-guided vascular access of the right common femoral artery. 2. Abdominal aortogram. 3. Selective catheterization and angiography of the inferior mesenteric artery, superior rectal artery, bilateral internal iliac arteries, and bilateral inferior rectal arteries 4. Gel-Foam embolization of the distal right inferior rectal artery MEDICATIONS: None. ANESTHESIA/SEDATION: Moderate (conscious) sedation was employed during this procedure. A total of Versed 0 mg and Fentanyl 0 mcg was administered intravenously. Moderate Sedation Time: 0 minutes. The patient's level of consciousness and vital signs were monitored continuously by radiology nursing throughout the procedure under my direct supervision. CONTRAST:  77mL OMNIPAQUE IOHEXOL 300 MG/ML SOLN, 52mL OMNIPAQUE IOHEXOL 300 MG/ML SOLN, 55mL OMNIPAQUE IOHEXOL 300 MG/ML SOLN FLUOROSCOPY TIME:   Fluoroscopy Time: 31 minutes 12 seconds (2,778 mGy). COMPLICATIONS: None immediate. PROCEDURE: Informed consent was obtained from the patient following explanation of the procedure, risks, benefits and alternatives. The patient understands, agrees and consents for the procedure. All questions were addressed. A time out was performed prior to the initiation of the procedure. Maximal barrier sterile technique utilized including caps, mask, sterile gowns, sterile gloves, large sterile drape, hand hygiene, and Betadine prep. The right groin was prepped and draped in standard fashion. Preprocedure ultrasound evaluation demonstrated patency of the right common femoral artery. A permanent image was captured and stored in the record. Subdermal Local anesthesia was provided at the planned needle entry site with 1% lidocaine. A small skin nick was made. Under direct ultrasound visualization, the right common femoral artery was accessed with standard micropuncture  technique. A limited right lower extremity angiogram was performed which demonstrated adequate puncture site for closure device use. A J wire was directed to the abdominal aorta and exchanged for a 5 French, 11 cm vascular sheath. A RIM catheter was introduced and was attempted to be used to engage the IMA however was unsuccessful. Abdominal aortogram was then performed in a steep obliquity with a pigtail flush catheter to demonstrate the ostium of the IMA. The pigtail catheter was then exchanged for a Mickelson catheter which successfully engaged the IMA ostium. A Cook cantata microcatheter and a synchro soft microwire were then inserted into the proximal IMA. IMA angiogram was then performed which demonstrated patency of the superior rectal artery and its branches. There is mild hyperemia in the rectum without definite evidence of active extravasation. The microcatheter was removed and the Mickelson catheter was redirected to select the left common iliac artery.  The catheter was exchanged over a Glidewire for a C2 catheter. The C2 catheter was uses likely internal iliac artery. Internal iliac angiogram was performed which demonstrated patent anterior and posterior proximal branches and in inferior rectal artery arising from the proximal anterior division. The microcatheter and wire were then used to select the inferior rectal artery proximally. Angiogram was performed which demonstrated no evidence of active extravasation. A Waltman loop was formed and the C2 catheter was used to select the right internal iliac artery. Angiogram of the right internal iliac artery demonstrated patent anterior posterior division within inferior rectal artery arising from the proximal aspect of the anterior division. The microcatheter and wire were used to select the inferior rectal artery. Angiogram demonstrated a possible focus of extravasation arising from a more superior branch of the distal inferior rectal artery. The distal branch was selected with the microcatheter. Additional angiogram was performed which demonstrated no evidence of active extravasation spilling into the rectum, however there was hyperemia and irregularity of the vessels in this region. Therefore, Gel-Foam embolization was performed using approximately 1 mL of Gel-Foam slurry. The catheter was retracted slightly and additional angiogram was performed which demonstrated complete embolization of the distal inferior rectal artery and its branches. No persistent hyperemia was observed. The microcatheter was removed. The C2 catheter was advanced to un formed a Waltman loop and removed. A 6 French Angio-Seal was then placed and deployed without complication or change in pulses. The patient tolerated the procedure well and remained stable throughout the procedure. The patient was transferred to the floor in stable condition. IMPRESSION: 1. Possible focal extravasation versus hyperemia about a distal branch of the right  inferior rectal artery. 2. Successful Gel-Foam slurry embolization of the distal right inferior rectal artery. Ruthann Cancer, MD [facility] Radiology Electronically Signed   By: Ruthann Cancer M.D.   On: 11/02/2021 16:47   ECHOCARDIOGRAM COMPLETE  Result Date: 11/03/2021    ECHOCARDIOGRAM REPORT   Patient Name:   [name] Date of Exam: 11/03/2021 Medical Rec #:  [id number]       Height:       74.0 in Accession #:    [id number]      Weight:       250.0 lb Date of Birth:  1956/05/29       BSA:          2.390 m Patient Age:    89 years        BP:           118/73 mmHg Patient Gender: M  HR:           82 bpm. Exam Location:  Inpatient Procedure: 2D Echo, Color Doppler and Cardiac Doppler Indications:    Chest pain  History:        Patient has prior history of Echocardiogram examinations. Risk                 Factors:Hypertension, Diabetes and Sleep Apnea.  Sonographer:    Jyl Heinz Referring Phys: [id number] Kaplan  1. Left ventricular ejection fraction, by estimation, is 65 to 70%. The left ventricle has normal function. The left ventricle has no regional wall motion abnormalities. There is mild concentric left ventricular hypertrophy. Left ventricular diastolic parameters are consistent with Grade I diastolic dysfunction (impaired relaxation).  2. Right ventricular systolic function is normal. The right ventricular size is normal. Tricuspid regurgitation signal is inadequate for assessing PA pressure.  3. The mitral valve is grossly normal. No evidence of mitral valve regurgitation. No evidence of mitral stenosis.  4. The aortic valve is tricuspid. Aortic valve regurgitation is not visualized. No aortic stenosis is present.  5. Aortic dilatation noted. There is mild dilatation of the aortic root, measuring 40 mm. Conclusion(s)/Recommendation(s): Normal biventricular function without evidence of hemodynamically significant  valvular heart disease. FINDINGS  Left Ventricle: Left ventricular ejection fraction, by estimation, is 65 to 70%. The left ventricle has normal function. The left ventricle has no regional wall motion abnormalities. The left ventricular internal cavity size was normal in size. There is  mild concentric left ventricular hypertrophy. Left ventricular diastolic parameters are consistent with Grade I diastolic dysfunction (impaired relaxation). Right Ventricle: The right ventricular size is normal. No increase in right ventricular wall thickness. Right ventricular systolic function is normal. Tricuspid regurgitation signal is inadequate for assessing PA pressure. Left Atrium: Left atrial size was normal in size. Right Atrium: Right atrial size was normal in size. Pericardium: There is no evidence of pericardial effusion. Mitral Valve: The mitral valve is grossly normal. Mild mitral annular calcification. No evidence of mitral valve regurgitation. No evidence of mitral valve stenosis. Tricuspid Valve: The tricuspid valve is grossly normal. Tricuspid valve regurgitation is trivial. No evidence of tricuspid stenosis. Aortic Valve: The aortic valve is tricuspid. Aortic valve regurgitation is not visualized. No aortic stenosis is present. Aortic valve mean gradient measures 10.0 mmHg. Aortic valve peak gradient measures 17.5 mmHg. Aortic valve area, by VTI measures 4.49 cm. Pulmonic Valve: The pulmonic valve was grossly normal. Pulmonic valve regurgitation is not visualized. No evidence of pulmonic stenosis. Aorta: Aortic dilatation noted. There is mild dilatation of the aortic root, measuring 40 mm. Venous: The inferior vena cava was not well visualized. IAS/Shunts: The atrial septum is grossly normal.  LEFT VENTRICLE PLAX 2D LVIDd:         4.10 cm     Diastology LVIDs:         2.50 cm     LV e' medial:    8.55 cm/s LV PW:         1.30 cm     LV E/e' medial:  10.2 LV IVS:        1.30 cm     LV e' lateral:   8.38 cm/s LVOT  diam:     2.40 cm     LV E/e' lateral: 10.4 LV SV:         159 LV SV Index:   67 LVOT Area:     4.52 cm  LV Volumes (MOD)  LV vol d, MOD A2C: 97.2 ml LV vol d, MOD A4C: 92.8 ml LV vol s, MOD A2C: 32.5 ml LV vol s, MOD A4C: 33.3 ml LV SV MOD A2C:     64.7 ml LV SV MOD A4C:     92.8 ml LV SV MOD BP:      63.0 ml RIGHT VENTRICLE RV Basal diam:  2.90 cm RV Mid diam:    2.60 cm RV S prime:     12.60 cm/s TAPSE (M-mode): 2.5 cm LEFT ATRIUM             Index        RIGHT ATRIUM           Index LA diam:        3.20 cm 1.34 cm/m   RA Area:     18.70 cm LA Vol (A2C):   47.4 ml 19.84 ml/m  RA Volume:   54.50 ml  22.81 ml/m LA Vol (A4C):   36.8 ml 15.40 ml/m LA Biplane Vol: 43.5 ml 18.20 ml/m  AORTIC VALVE AV Area (Vmax):    3.96 cm AV Area (Vmean):   4.21 cm AV Area (VTI):     4.49 cm AV Vmax:           209.00 cm/s AV Vmean:          152.500 cm/s AV VTI:            0.355 m AV Peak Grad:      17.5 mmHg AV Mean Grad:      10.0 mmHg LVOT Vmax:         183.00 cm/s LVOT Vmean:        142.000 cm/s LVOT VTI:          0.352 m LVOT/AV VTI ratio: 0.99  AORTA Ao Root diam: 4.00 cm Ao Asc diam:  3.40 cm MITRAL VALVE MV Area (PHT): 3.36 cm     SHUNTS MV Decel Time: 226 msec     Systemic VTI:  0.35 m MV E velocity: 86.90 cm/s   Systemic Diam: 2.40 cm MV A velocity: 128.00 cm/s MV E/A ratio:  0.68 Eleonore Chiquito MD Electronically signed by Eleonore Chiquito MD Signature Date/Time: 11/03/2021/3:38:33 PM    Final    IR EMBO ART  VEN HEMORR LYMPH EXTRAV  INC GUIDE ROADMAPPING  Result Date: 11/04/2021 INDICATION: 65 year old male with rectal hemorrhage status post recent transrectal prostate biopsy and status post right inferior rectal gelfoam embolization on 11/02/21 with recurrent hemorrhage and profound anemia. He refuses blood transfusion as he is Jehovah's Witness. Currently hemodynamically stable. EXAM: 1. Ultrasound-guided vascular access of the right common femoral artery. 2. Selective catheterization angiography of the  inferior mesenteric artery, superior rectal artery, and anterior divisions of the bilateral internal iliac arteries 3. Particle embolization of the superior rectal artery. 4. Gel-Foam slurry embolization of the anterior division of the right internal iliac artery. MEDICATIONS: None. ANESTHESIA/SEDATION: Moderate (conscious) sedation was employed during this procedure. A total of Versed 2 mg and Fentanyl 100 mcg was administered intravenously. Moderate Sedation Time: 76 minutes. The patient's level of consciousness and vital signs were monitored continuously by radiology nursing throughout the procedure under my direct supervision. CONTRAST:  79mL OMNIPAQUE IOHEXOL 300 MG/ML SOLN, 36mL OMNIPAQUE IOHEXOL 300 MG/ML SOLN, 74mL OMNIPAQUE IOHEXOL 300 MG/ML SOLN FLUOROSCOPY TIME:  Fluoroscopy Time: 29 minutes 30 seconds (1,318 mGy). COMPLICATIONS: None immediate. PROCEDURE: Informed consent was obtained from the patient following explanation of the procedure,  risks, benefits and alternatives. The patient understands, agrees and consents for the procedure. All questions were addressed. A time out was performed prior to the initiation of the procedure. Maximal barrier sterile technique utilized including caps, mask, sterile gowns, sterile gloves, large sterile drape, hand hygiene, and Betadine prep. The right groin was prepped and draped in standard fashion. Preprocedure ultrasound evaluation demonstrated patency of the right common femoral artery. A permanent image was captured and stored in the record. Subdermal Local anesthesia was provided at the planned needle entry site with 1% lidocaine. A small skin nick was made. Under direct ultrasound visualization, the right common femoral artery is accessed with standard micropuncture technique. A limited right lower extremity angiogram was performed which demonstrated adequate puncture site for closure device use. A J wire was directed to the abdominal aorta exchanged for a 5  French, 11 cm vascular sheath. A Mickelson catheter was then used to select the inferior mesenteric artery ostium. Inferior mesenteric angiogram was performed which demonstrated patency of the IMA. A Cook cantata microcatheter and a synchro soft microwire were then inserted into the proximal IMA and directed to the superior rectal artery. Angiogram of the superior rectal artery was performed which demonstrated perfusion of the bilateral rectum without any evidence of active extravasation. There was a small focal focus of hyperemia about the rectum which could represent etiology of hemorrhage. With the catheter parked at the bifurcation of the proximal superior rectal artery, particle embolization was performed using 500-700 micron embospheres. Near complete stasis was achieved. The microcatheter was removed and the Mickelson catheter was redirected to select the left common iliac artery. The catheter was exchanged over Glidewire for a C2 catheter. The C2 catheter was used to select the internal iliac artery. Internal iliac angiogram was performed which demonstrated patent anterior and posterior proximal branches and an inferior rectal artery arising from the proximal anterior division. Attempts at cannulating the inferior rectal artery were made with the microcatheter and microwire, however vessel spasm occurred in the inferior rectal artery was no longer visible. The microcatheter was removed and a Waltman loop was formed to redirect the C2 catheter into the right internal iliac artery. Angiogram of the right internal iliac artery demonstrated patent anterior posterior division with the inferior rectal artery arising from the proximal aspect of the anterior division. The microcatheter and wire used to select the anterior division of the internal iliac artery. Position was confirmed with limited angiogram. A Gel-Foam slurry was made and embolization of the anterior division of the right internal iliac artery was  performed. Upon completion of embolization, there is sluggish pulsatile antegrade flow with pruning of the distal branches including the inferior rectal artery. The microcatheter was removed and the C2 catheter was redirected to the common iliac artery and removed over a J wire. A 6 French Angio-Seal was then placed and deployed without complication or change in pulses. The patient tolerated the procedure well and remained stable throughout the procedure. The patient was transferred to the floor in stable condition. IMPRESSION: 1. Rectal hyperemia without evidence of active extravasation. 2. Prophylactic superior rectal artery particle embolization and Gelfoam embolization of the anterior division of the right common iliac artery Ruthann Cancer, MD [facility] Radiology Electronically Signed   By: Ruthann Cancer M.D.   On: 11/04/2021 13:14   IR EMBO ART  VEN HEMORR LYMPH EXTRAV  INC GUIDE ROADMAPPING  Result Date: 11/04/2021 INDICATION: 65 year old male with rectal hemorrhage status post recent  transrectal prostate biopsy and status post right inferior rectal gelfoam embolization on 11/02/21 with recurrent hemorrhage and profound anemia. He refuses blood transfusion as he is Jehovah's Witness. Currently hemodynamically stable. EXAM: 1. Ultrasound-guided vascular access of the right common femoral artery. 2. Selective catheterization angiography of the inferior mesenteric artery, superior rectal artery, and anterior divisions of the bilateral internal iliac arteries 3. Particle embolization of the superior rectal artery. 4. Gel-Foam slurry embolization of the anterior division of the right internal iliac artery. MEDICATIONS: None. ANESTHESIA/SEDATION: Moderate (conscious) sedation was employed during this procedure. A total of Versed 2 mg and Fentanyl 100 mcg was administered intravenously. Moderate Sedation Time: 76 minutes. The patient's level of consciousness and  vital signs were monitored continuously by radiology nursing throughout the procedure under my direct supervision. CONTRAST:  43mL OMNIPAQUE IOHEXOL 300 MG/ML SOLN, 57mL OMNIPAQUE IOHEXOL 300 MG/ML SOLN, 46mL OMNIPAQUE IOHEXOL 300 MG/ML SOLN FLUOROSCOPY TIME:  Fluoroscopy Time: 29 minutes 30 seconds (1,318 mGy). COMPLICATIONS: None immediate. PROCEDURE: Informed consent was obtained from the patient following explanation of the procedure, risks, benefits and alternatives. The patient understands, agrees and consents for the procedure. All questions were addressed. A time out was performed prior to the initiation of the procedure. Maximal barrier sterile technique utilized including caps, mask, sterile gowns, sterile gloves, large sterile drape, hand hygiene, and Betadine prep. The right groin was prepped and draped in standard fashion. Preprocedure ultrasound evaluation demonstrated patency of the right common femoral artery. A permanent image was captured and stored in the record. Subdermal Local anesthesia was provided at the planned needle entry site with 1% lidocaine. A small skin nick was made. Under direct ultrasound visualization, the right common femoral artery is accessed with standard micropuncture technique. A limited right lower extremity angiogram was performed which demonstrated adequate puncture site for closure device use. A J wire was directed to the abdominal aorta exchanged for a 5 French, 11 cm vascular sheath. A Mickelson catheter was then used to select the inferior mesenteric artery ostium. Inferior mesenteric angiogram was performed which demonstrated patency of the IMA. A Cook cantata microcatheter and a synchro soft microwire were then inserted into the proximal IMA and directed to the superior rectal artery. Angiogram of the superior rectal artery was performed which demonstrated perfusion of the bilateral rectum without any evidence of active extravasation. There was a small focal focus  of hyperemia about the rectum which could represent etiology of hemorrhage. With the catheter parked at the bifurcation of the proximal superior rectal artery, particle embolization was performed using 500-700 micron embospheres. Near complete stasis was achieved. The microcatheter was removed and the Mickelson catheter was redirected to select the left common iliac artery. The catheter was exchanged over Glidewire for a C2 catheter. The C2 catheter was used to select the internal iliac artery. Internal iliac angiogram was performed which demonstrated patent anterior and posterior proximal branches and an inferior rectal artery arising from the proximal anterior division. Attempts at cannulating the inferior rectal artery were made with the microcatheter and microwire, however vessel spasm occurred in the inferior rectal artery was no longer visible. The microcatheter was removed and a Waltman loop was formed to redirect the C2 catheter into the right internal iliac artery. Angiogram of the right internal iliac artery demonstrated patent anterior posterior division with the inferior rectal artery arising from the proximal aspect of the anterior division. The microcatheter and wire used to select the anterior division of the internal iliac artery. Position was confirmed with  limited angiogram. A Gel-Foam slurry was made and embolization of the anterior division of the right internal iliac artery was performed. Upon completion of embolization, there is sluggish pulsatile antegrade flow with pruning of the distal branches including the inferior rectal artery. The microcatheter was removed and the C2 catheter was redirected to the common iliac artery and removed over a J wire. A 6 French Angio-Seal was then placed and deployed without complication or change in pulses. The patient tolerated the procedure well and remained stable throughout the procedure. The patient was transferred to the floor in stable condition.  IMPRESSION: 1. Rectal hyperemia without evidence of active extravasation. 2. Prophylactic superior rectal artery particle embolization and Gelfoam embolization of the anterior division of the right common iliac artery Ruthann Cancer, MD [facility] Radiology Electronically Signed   By: Ruthann Cancer M.D.   On: 11/04/2021 13:14   IR EMBO ART  VEN HEMORR LYMPH EXTRAV  INC GUIDE ROADMAPPING  Result Date: 11/02/2021 INDICATION: 65 year old male status post recent transrectal prostate biopsy presenting with hematochezia, anemia, and CT arteriogram evidence of acute hemorrhage from the rectum. EXAM: 1. Ultrasound-guided vascular access of the right common femoral artery. 2. Abdominal aortogram. 3. Selective catheterization and angiography of the inferior mesenteric artery, superior rectal artery, bilateral internal iliac arteries, and bilateral inferior rectal arteries 4. Gel-Foam embolization of the distal right inferior rectal artery MEDICATIONS: None. ANESTHESIA/SEDATION: Moderate (conscious) sedation was employed during this procedure. A total of Versed 0 mg and Fentanyl 0 mcg was administered intravenously. Moderate Sedation Time: 0 minutes. The patient's level of consciousness and vital signs were monitored continuously by radiology nursing throughout the procedure under my direct supervision. CONTRAST:  75mL OMNIPAQUE IOHEXOL 300 MG/ML SOLN, 61mL OMNIPAQUE IOHEXOL 300 MG/ML SOLN, 18mL OMNIPAQUE IOHEXOL 300 MG/ML SOLN FLUOROSCOPY TIME:  Fluoroscopy Time: 31 minutes 12 seconds (2,778 mGy). COMPLICATIONS: None immediate. PROCEDURE: Informed consent was obtained from the patient following explanation of the procedure, risks, benefits and alternatives. The patient understands, agrees and consents for the procedure. All questions were addressed. A time out was performed prior to the initiation of the procedure. Maximal barrier sterile technique utilized including caps,  mask, sterile gowns, sterile gloves, large sterile drape, hand hygiene, and Betadine prep. The right groin was prepped and draped in standard fashion. Preprocedure ultrasound evaluation demonstrated patency of the right common femoral artery. A permanent image was captured and stored in the record. Subdermal Local anesthesia was provided at the planned needle entry site with 1% lidocaine. A small skin nick was made. Under direct ultrasound visualization, the right common femoral artery was accessed with standard micropuncture technique. A limited right lower extremity angiogram was performed which demonstrated adequate puncture site for closure device use. A J wire was directed to the abdominal aorta and exchanged for a 5 French, 11 cm vascular sheath. A RIM catheter was introduced and was attempted to be used to engage the IMA however was unsuccessful. Abdominal aortogram was then performed in a steep obliquity with a pigtail flush catheter to demonstrate the ostium of the IMA. The pigtail catheter was then exchanged for a Mickelson catheter which successfully engaged the IMA ostium. A Cook cantata microcatheter and a synchro soft microwire were then inserted into the proximal IMA. IMA angiogram was then performed which demonstrated patency of the superior rectal artery and its branches. There is mild hyperemia in the rectum without definite evidence of active extravasation. The microcatheter was removed and the Mickelson catheter was redirected  to select the left common iliac artery. The catheter was exchanged over a Glidewire for a C2 catheter. The C2 catheter was uses likely internal iliac artery. Internal iliac angiogram was performed which demonstrated patent anterior and posterior proximal branches and in inferior rectal artery arising from the proximal anterior division. The microcatheter and wire were then used to select the inferior rectal artery proximally. Angiogram was performed which demonstrated no  evidence of active extravasation. A Waltman loop was formed and the C2 catheter was used to select the right internal iliac artery. Angiogram of the right internal iliac artery demonstrated patent anterior posterior division within inferior rectal artery arising from the proximal aspect of the anterior division. The microcatheter and wire were used to select the inferior rectal artery. Angiogram demonstrated a possible focus of extravasation arising from a more superior branch of the distal inferior rectal artery. The distal branch was selected with the microcatheter. Additional angiogram was performed which demonstrated no evidence of active extravasation spilling into the rectum, however there was hyperemia and irregularity of the vessels in this region. Therefore, Gel-Foam embolization was performed using approximately 1 mL of Gel-Foam slurry. The catheter was retracted slightly and additional angiogram was performed which demonstrated complete embolization of the distal inferior rectal artery and its branches. No persistent hyperemia was observed. The microcatheter was removed. The C2 catheter was advanced to un formed a Waltman loop and removed. A 6 French Angio-Seal was then placed and deployed without complication or change in pulses. The patient tolerated the procedure well and remained stable throughout the procedure. The patient was transferred to the floor in stable condition. IMPRESSION: 1. Possible focal extravasation versus hyperemia about a distal branch of the right inferior rectal artery. 2. Successful Gel-Foam slurry embolization of the distal right inferior rectal artery. Ruthann Cancer, MD [facility] Radiology Electronically Signed   By: Ruthann Cancer M.D.   On: 11/02/2021 16:47   CT Angio Abd/Pel w/ and/or w/o  Result Date: 11/02/2021 CLINICAL DATA:  Acute lower GI bleeding, status post prostate biopsy last week. EXAM: CT ANGIOGRAPHY ABDOMEN  AND PELVIS WITH CONTRAST AND WITHOUT CONTRAST TECHNIQUE: Multidetector CT imaging of the abdomen and pelvis was performed using the standard protocol during bolus administration of intravenous contrast. Multiplanar reconstructed images and MIPs were obtained and reviewed to evaluate the vascular anatomy. CONTRAST:  131mL OMNIPAQUE IOHEXOL 350 MG/ML SOLN COMPARISON:  10/08/2020 FINDINGS: VASCULAR Aorta: Minor aortic atherosclerosis. Negative for aneurysm, dissection or occlusive process. No retroperitoneal hemorrhage or hematoma. Celiac: Minor atherosclerotic origin but remains patent including its branches SMA: Minor atherosclerotic origin but remains patent including its branches Renals: Main renal arteries remain patent. Minor atherosclerotic changes. No accessory renal artery. IMA: Remains patent off the distal aorta anteriorly including its branches. Lower rectum active contrast extravasation on the arterial phase which progresses on the delayed portal venous phase imaging compatible with acute active lower rectal GI bleeding. Inflow: Iliac atherosclerosis noted but no inflow disease or occlusion. Mild iliac tortuosity. Common, internal and external iliac arteries all remain patent. Proximal Outflow: Common femoral, proximal profunda femoral, proximal superficial femoral arteries all remain patent. Veins: No veno-occlusive process. Review of the MIP images confirms the above findings. NON-VASCULAR Lower chest: No acute abnormality. Hepatobiliary: No focal hepatic abnormality. Small left hepatic lobe noted. No biliary obstruction pattern dilatation. Patent portal and hepatic veins. Small gallstones noted. Common bile duct nondilated. Pancreas: Unremarkable. No pancreatic ductal dilatation or surrounding inflammatory changes. Spleen: Normal in size without  focal abnormality. Adrenals/Urinary Tract: Adrenal glands are unremarkable. Kidneys are normal, without renal calculi, focal lesion, or hydronephrosis. Bladder  is unremarkable. Stomach/Bowel: Postop changes from gastric bypass. Negative for bowel obstruction, significant dilatation, ileus, or free air. Appendix not visualized. No free fluid, fluid collection, abscess or ascites. Lymphatic: No bulky adenopathy. Reproductive: No other significant finding by CT. Other: No abdominal wall hernia or abnormality. No abdominopelvic ascites. Musculoskeletal: Degenerative changes throughout the spine. Multilevel lower lumbar facet arthropathy. No acute osseous finding. IMPRESSION: VASCULAR Positive CTA for acute lower rectal GI bleeding. These results were called by telephone at the time of interpretation on 11/02/2021 at 11:16 am to provider AMY ESTERWOOD , who verbally acknowledged these results. NON-VASCULAR Remote gastric bypass surgery Incidental cholelithiasis Electronically Signed   By: Jerilynn Mages.  Shick M.D.   On: 11/02/2021 11:21    ASSESSMENT AND PLAN: This is a 65 year old male with a history of bilateral PE in 2007 who has been on Coumadin since that time without recurrent DVT or PE, significant anemia secondary to rectal bleeding following prostate biopsy (does not accept blood products secondary to religious reasons), and new diagnosis of prostate cancer.   1.  History of bilateral PE in 2007 2.  Anemia secondary to rectal bleeding 3.  New diagnosis of intermediate risk prostate cancer 4.  Jehovah's Witness   -We recommend against resuming anticoagulation. -Rectal bleeding has resolved and hemoglobin is stable at this time.  He is status post embolization, Feraheme, and Aranesp 200 mcg subcu x1.  He is also taking oral vitamin Z66 and folic acid.  Recommend that he continue Q94 and folic acid. -His hemoglobin is stable at this time and he is not having any recurrent bleeding.  Would be okay from our standpoint to discharge over the weekend if his hemoglobin remained stable without recurrent bleeding.  He is currently asymptomatic from his anemia. -The patient will  follow-up with urology as an outpatient for management of his prostate cancer.  Message sent to cancer center scheduler to arrange for outpatient follow-up with lab work in approximately 1 week.  Future Appointments  Date Time Provider [place]  11/11/2021  8:30 AM [facility] None  11/11/2021  9:00 AM Alla Feeling, NP [facility] None  12/02/2021  9:00 AM Lyndal Pulley, DO [facility] None  03/23/2022 10:30 AM [facility] 1 [facility]  05/10/2022  1:45 PM Hayden Pedro, MD [facility] None      LOS: 3 days   Mikey Bussing, DNP, AGPCNP-BC, AOCNP 11/06/21  Addendum I have seen the patient, examined him. I agree with the assessment and and plan and have edited the notes.   Pt is doing well overall, walked with PT today in hallway without a significant dyspnea, chest pain, or dizziness.  He had a 2 small bowel movement with blood clots today.  No other bleeding.  Labs reviewed, hemoglobin stable.  He has received a total of 760 mg IV iron, and 1 dose of Aranesp 200 mcg.  No additional medical treatment for his anemia at this point.  I plan to see him back in my office in 2 weeks, to see if he needs more IV iron.   Truitt Merle  11/06/2021

## 2021-11-06 NOTE — Evaluation (Signed)
Physical Therapy Evaluation Patient Details Name: SIAOSI ALTER MRN: [id number] DOB: 1956-04-30 Today's Date: 11/06/2021       Clinical Impression    Pt is a 65 y.o. male with below listed HPI. Pt performed all mobility with supervision for safety-MOD I. Pt with low Hgb and HCT levels, does not accept blood products due to religious reasons. Discussed pt status and management plan with RN prior to session and pt denying any symptoms throughout. Pt ambulated total of ~357ft with supervision and chair follow provided for safety, no LOB observed. Pt expressed comfort with current mobility level and agreeable no further PT services needed during acute stay. Pt will have assist from his wife upon d/c who was present throughout session. Educated pt and wife on energy conservation and awareness to onset of symptoms to maximize safety. Pt is currently at baseline level of mobility. No skilled PT needs identified at this time. PT will sign off.         Recommendations for follow up therapy are one component of a multi-disciplinary discharge planning process, led by the attending physician.  Recommendations may be updated based on patient status, additional functional criteria and insurance authorization.    11/06/21 1000  PT Visit Information  Last PT Received On 11/06/21  Assistance Needed +1  History of Present Illness Pt is a 65 y.o. male admitted with acute lower GI bleed likely from recent transrectal prostate biopsy with profound anemia. Patient is a Sales promotion account executive Witness, will not accept blood products secondary to religious reasons. Received IV iron on 11/29. PMH significant for bil PE,  new diagnosis of prostate cx, OA, CAD, depression, DM, DVT, HLD, HTN, and B TKA.  Precautions  Precautions Fall  Precaution Comments Pt is Jehovah's winess will not accept blood products. Hgb and Hct levels low, pt asymptomatic throughout session.  Restrictions  Weight Bearing Restrictions No  Home Living   Family/patient expects to be discharged to: Private residence  Living Arrangements Spouse/significant other  Available Help at Discharge Family;Friend(s)  Type of [place] to enter  Entrance Stairs-Number of Steps 4  Entrance Stairs-Rails Can reach both  [place] One level  Bathroom Chartered certified accountant  (comfort rise)  Architectural technologist Yes  Home Equipment Cane - single point;Standard Environmental consultant;Shower seat;Grab bars - toilet;Grab bars - tub/shower  Additional Comments likes to fix old cars.  Prior Function  Prior Level of Function  Independent/Modified Independent;Driving  Mobility Comments denies history of falls  Communication  Communication No difficulties  Pain Assessment  Pain Assessment No/denies pain  Cognition  Arousal/Alertness Awake/alert  Behavior During Therapy WFL for tasks assessed/performed  Overall Cognitive Status Within Functional Limits for tasks assessed  Upper Extremity Assessment  Upper Extremity Assessment Overall WFL for tasks assessed  Lower Extremity Assessment  Lower Extremity Assessment Overall WFL for tasks assessed  Cervical / Trunk Assessment  Cervical / Trunk Assessment Normal  Bed Mobility  Overal bed mobility Independent  General bed mobility comments HOB slightly elevated  Transfers  Overall transfer level Modified independent  Equipment used None  Transfers Sit to/from Stand  Sit to Stand Supervision  General transfer comment supervision for safety  Ambulation/Gait  Ambulation/Gait assistance Supervision  Gait Distance (Feet) 300 Feet  Assistive device None  Gait Pattern/deviations Step-through pattern  General Gait Details chair follow provided for safety due to pt's low lab values. Pt denying symptoms throughout session. reported mild cramping/fatigue sensation in Bil hips/buttock area following mobility.Vitals  following ambulation: 137/73, 79bpm  Gait velocity decreased  Stairs  Yes  Stairs assistance Supervision  Stair Management One rail Left;Forwards  Number of Stairs 10  General stair comments safe stair negotiation demonstrated.  recriprocal pattern with no LOB observed. Supervision provided for safety, pt steady with use of single handrail.  Balance  Overall balance assessment No apparent balance deficits (not formally assessed)  PT - End of Session  Equipment Utilized During Treatment Gait belt  Activity Tolerance Patient tolerated treatment well  Patient left in bed;with call bell/phone within reach;with family/visitor present  Nurse Communication Mobility status  PT Assessment  PT Recommendation/Assessment Patient does not need any further PT services  PT Visit Diagnosis Unsteadiness on feet (R26.81)  No Skilled PT Patient is supervision for all activity/mobility;Patient will have necessary level of assist by caregiver at discharge  AM-PAC PT "6 Clicks" Mobility Outcome Measure (Version 2)  Help needed turning from your back to your side while in a flat bed without using bedrails? 4  Help needed moving from lying on your back to sitting on the side of a flat bed without using bedrails? 4  Help needed moving to and from a bed to a chair (including a wheelchair)? 3  Help needed standing up from a chair using your arms (e.g., wheelchair or bedside chair)? 3  Help needed to walk in hospital room? 3  Help needed climbing 3-5 steps with a railing?  3  6 Click Score 20  Consider Recommendation of Discharge To: Home with no services  Progressive Mobility  What is the highest level of mobility based on the progressive mobility assessment? Level 5 (Walks with assist in room/hall) - Balance while stepping forward/back and can walk in room with assist - Complete  Mobility Ambulated with assistance in hallway  PT Recommendation  Follow Up Recommendations No PT follow up  Assistance recommended at discharge Intermittent Supervision/Assistance  Functional Status  Assessment Patient has not had a recent decline in their functional status  PT equipment None recommended by PT  Acute Rehab PT Goals  Patient Stated Goal Go home  PT Goal Formulation With patient/family  Time For Goal Achievement 11/20/21  Potential to Achieve Goals Good  PT Time Calculation  PT Start Time (ACUTE ONLY) 1002  PT Stop Time (ACUTE ONLY) 1023  PT Time Calculation (min) (ACUTE ONLY) 21 min  PT General Charges  $$ ACUTE PT VISIT 1 Visit  PT Evaluation  $PT Eval Low Complexity 1 Low  Written Expression  Dominant Hand Left        Festus Barren PT, DPT  Acute Rehabilitation Services  Office [phone]   11/06/2021, 2:00 PM

## 2021-11-07 DIAGNOSIS — K922 Gastrointestinal hemorrhage, unspecified: Secondary | ICD-10-CM | POA: Diagnosis not present

## 2021-11-07 DIAGNOSIS — D62 Acute posthemorrhagic anemia: Secondary | ICD-10-CM | POA: Diagnosis not present

## 2021-11-07 LAB — HEMOGLOBIN AND HEMATOCRIT, BLOOD
HCT: 18.8 % — ABNORMAL LOW (ref 39.0–52.0)
Hemoglobin: 5.7 g/dL — CL (ref 13.0–17.0)

## 2021-11-07 LAB — GLUCOSE, CAPILLARY
Glucose-Capillary: 122 mg/dL — ABNORMAL HIGH (ref 70–99)
Glucose-Capillary: 123 mg/dL — ABNORMAL HIGH (ref 70–99)
Glucose-Capillary: 127 mg/dL — ABNORMAL HIGH (ref 70–99)
Glucose-Capillary: 135 mg/dL — ABNORMAL HIGH (ref 70–99)
Glucose-Capillary: 172 mg/dL — ABNORMAL HIGH (ref 70–99)

## 2021-11-07 NOTE — Progress Notes (Signed)
PROGRESS NOTE    [name]  NGE:[id number] DOB: 1956/01/09 DOA: 11/02/2021 PCP: Biagio Borg, MD   Chief Complaint  Patient presents with   Rectal Bleeding    Brief Narrative:  65 year old gentleman(Jehovah's Witness), prior history of hypertension, hyperlipidemia, pulmonary embolism on Coumadin and Lovenox, type 2 diabetes, prostate cancer, GERD, recently underwent transrectal ultrasound-guided prostate biopsy on 10/26/2021 secondary to elevated PSA presents to ED with significant bright red blood per rectum, some lightheadedness and dizziness.  Patient denies any NSAID use.   Patient underwent reversal with vitamin K in the ER embolization on 11/02/2021.  Patient continues to have bright red blood per rectum.  GI was consulted.  IR consulted and patient underwent mesenteric and pelvic angiogram with particle embolization of superior rectal artery with Gelfoam embolization of anterior division of the right internal iliac artery.  Urology, oncology, gastroenterology, general surgery, IR on board and appreciate recommendations. Pt seen at bedside. Pt has no BM yet. His hemoglobin is improving and trending up to 5.7.     Assessment & Plan:   Principal Problem:   GIB (gastrointestinal bleeding) Active Problems:   Hyperlipidemia   Depression   Essential hypertension   Pulmonary embolism (HCC)   Gross hematuria   Acute blood loss anemia   Esophageal reflux   History of pulmonary embolus (PE)   Diabetes mellitus type 2 in obese (HCC)   Chest pain   Hematochezia   Abnormal CT of the abdomen   Refusal of blood transfusions as patient is Jehovah's Witness  Acute GI bleed/acute blood loss anemia Probably secondary to lower rectal bleed as noted on the CT angiogram of the abdomen and pelvis. Hemoglobin around 11.5 on admission dropped to 4.9, improved to 6.7 , dropped to 5.2 to 5.1.   Patient is s/p mesenteric and pelvic angiogram with particle embolization of superior rectal  artery and Gelfoam embolization of the anterior addition of the right internal iliac artery. Patient is Jehovah's Witness and will not receive any blood products at this time. Hematology on board and recommending supportive care with IV iron, folic acid and M01 supplements. Hematology also recommending discontinuation of anticoagulation with Coumadin as patient's provoked PE/DVT was almost 15 years ago, without any recurrent clots.  He also received a dose of Epogen on 11/04/21.  Advanced diet as tolerated. Currently on soft diet.  Hemoglobin improvijg and trending upward to 5.7, no BM yet.  Pt wants to have a normal BM prior to going home.     History of bilateral PE Patient noted to have provoked PE/DVT in 2007 and was started on anticoagulation. In light of the ongoing lower GI bleed, hematology recommending discontinuation of anticoagulation with Coumadin. Patient is agreeable to the plan.    New diagnosis of intermediate risk prostate cancer Urology on board and recommend outpatient follow-up. Pain control.     Chest pain Appears to have resolved. 2D echocardiogram showed left ventricular ejection fraction of 70% with no regional wall motion abnormalities, grade 1 diastolic dysfunction present.     Type 2 diabetes mellitus Hemoglobin A1c around 7.2 in October 2022 Continue with sliding scale insulin. CBG (last 3)  Recent Labs    11/07/21 0005 11/07/21 0548 11/07/21 1205  GLUCAP 127* 123* 135*   No changes in meds.      Essential hypertension Blood pressure parameters are optimal.     Hyperlipidemia Continue with statin   Depression Patient was on Lexapro which is being held for now.   GERD  Continue with PPI     DVT prophylaxis: SCDs Code Status: Full code Family Communication: Family at bedside Disposition:   Status is: Inpatient  Remains inpatient appropriate because:  I f pt tolerated diet and no rectal bleeding he can be discharged  tomorrow.        Consultants:  Gastroenterology General surgery IR Dr. Serafina Royals  Procedures: IR EMBOLIZATION.   Antimicrobials: none.    Subjective: No BM y et.   Objective: Vitals:   11/06/21 1137 11/06/21 1959 11/06/21 2221 11/07/21 0622  BP: 106/65 110/63 110/65 112/68  Pulse: 62 68 72 61  Resp: 16 20 18 20   Temp: 98.4 F (36.9 C) 98.7 F (37.1 C) 98.4 F (36.9 C) 98.5 F (36.9 C)  TempSrc: Oral Oral Oral Oral  SpO2: 97% 100% 99% 100%  Weight:      Height:        Intake/Output Summary (Last 24 hours) at 11/07/2021 1517 Last data filed at 11/07/2021 1300 Gross per 24 hour  Intake 600 ml  Output 2100 ml  Net -1500 ml    Filed Weights   11/02/21 0247 11/03/21 1917 11/04/21 0453  Weight: 113.4 kg 113.4 kg 122 kg    Examination:  General exam: Appears calm and comfortable  Respiratory system: Clear to auscultation. Respiratory effort normal. Cardiovascular system: S1 & S2 heard, RRR. No JVD, murmurs, rubs, gallops or clicks. No pedal edema. Gastrointestinal system: Abdomen is nondistended, soft and nontender. No organomegaly or masses felt. Normal bowel sounds heard. Central nervous system: Alert and oriented. No focal neurological deficits. Extremities: Symmetric 5 x 5 power. Skin: No rashes, lesions or ulcers Psychiatry: Judgement and insight appear normal. Mood & affect appropriate.        Data Reviewed: I have personally reviewed following labs and imaging studies  CBC: Recent Labs  Lab 11/02/21 0340 11/02/21 1224 11/03/21 0455 11/03/21 1142 11/04/21 1843 11/05/21 0050 11/05/21 1055 11/06/21 0528 11/07/21 1005  WBC 6.0  --  7.8  --   --   --   --  7.3  --   NEUTROABS 2.9  --   --   --   --   --   --   --   --   HGB 11.5*   < > 6.8*   < > 4.9* 6.7* 5.2* 5.1* 5.7*  HCT 37.7*   < > 22.2*   < > 15.5* 21.6* 16.5* 16.3* 18.8*  MCV 72.2*  --  73.3*  --   --   --   --  72.4*  --   PLT 191  --  146*  --   --   --   --  148*  --    < > = values  in this interval not displayed.     Basic Metabolic Panel: Recent Labs  Lab 11/02/21 0340 11/03/21 0455 11/04/21 0501 11/06/21 0528  NA 139 134* 135 134*  K 4.9 4.5 4.0 3.6  CL 105 107 110 105  CO2 25 21* 24 26  GLUCOSE 101* 168* 117* 129*  BUN 31* 36* 25* 10  CREATININE 1.29* 1.67* 1.26* 1.19  CALCIUM 8.9 8.1* 7.6* 8.3*     GFR: Estimated Creatinine Clearance: 83.6 mL/min (by C-G formula based on SCr of 1.19 mg/dL).  Liver Function Tests: Recent Labs  Lab 11/02/21 0340 11/03/21 0455  AST 43* 28  ALT 34 27  ALKPHOS 48 37*  BILITOT 0.5 0.5  PROT 6.8 4.9*  ALBUMIN 4.0 3.1*  CBG: Recent Labs  Lab 11/06/21 1121 11/06/21 1708 11/07/21 0005 11/07/21 0548 11/07/21 1205  GLUCAP 144* 95 127* 123* 135*      Recent Results (from the past 240 hour(s))  Resp Panel by RT-PCR (Flu A&B, Covid) Nasopharyngeal Swab     Status: None   Collection Time: 11/02/21  7:06 AM   Specimen: Nasopharyngeal Swab; Nasopharyngeal(NP) swabs in vial transport medium  Result Value Ref Range Status   SARS Coronavirus 2 by RT PCR NEGATIVE NEGATIVE Final    Comment: (NOTE) SARS-CoV-2 target nucleic acids are NOT DETECTED.  The SARS-CoV-2 RNA is generally detectable in upper respiratory specimens during the acute phase of infection. The lowest concentration of SARS-CoV-2 viral copies this assay can detect is 138 copies/mL. A negative result does not preclude SARS-Cov-2 infection and should not be used as the sole basis for treatment or other patient management decisions. A negative result may occur with  improper specimen collection/handling, submission of specimen other than nasopharyngeal swab, presence of viral mutation(s) within the areas targeted by this assay, and inadequate number of viral copies(<138 copies/mL). A negative result must be combined with clinical observations, patient history, and epidemiological information. The expected result is Negative.  Fact Sheet for  Patients:  EntrepreneurPulse.com.au  Fact Sheet for Healthcare Providers:  IncredibleEmployment.be  This test is no t yet approved or cleared by the [place] FDA and  has been authorized for detection and/or diagnosis of SARS-CoV-2 by FDA under an Emergency Use Authorization (EUA). This EUA will remain  in effect (meaning this test can be used) for the duration of the COVID-19 declaration under Section 564(b)(1) of the Act, 21 U.S.C.section 360bbb-3(b)(1), unless the authorization is terminated  or revoked sooner.       Influenza A by PCR NEGATIVE NEGATIVE Final   Influenza B by PCR NEGATIVE NEGATIVE Final    Comment: (NOTE) The Xpert Xpress SARS-CoV-2/FLU/RSV plus assay is intended as an aid in the diagnosis of influenza from Nasopharyngeal swab specimens and should not be used as a sole basis for treatment. Nasal washings and aspirates are unacceptable for Xpert Xpress SARS-CoV-2/FLU/RSV testing.  Fact Sheet for Patients: EntrepreneurPulse.com.au  Fact Sheet for Healthcare Providers: IncredibleEmployment.be  This test is not yet approved or cleared by the [place] FDA and has been authorized for detection and/or diagnosis of SARS-CoV-2 by FDA under an Emergency Use Authorization (EUA). This EUA will remain in effect (meaning this test can be used) for the duration of the COVID-19 declaration under Section 564(b)(1) of the Act, 21 U.S.C. section 360bbb-3(b)(1), unless the authorization is terminated or revoked.  Performed at [facility], [place] [address], [place], [place] [address]   MRSA Next Gen by PCR, Nasal     Status: None   Collection Time: 11/02/21 12:53 PM   Specimen: Nasal Mucosa; Nasal Swab  Result Value Ref Range Status   MRSA by PCR Next Gen NOT DETECTED NOT DETECTED Final    Comment: (NOTE) The GeneXpert MRSA Assay (FDA approved for NASAL specimens only), is  one component of a comprehensive MRSA colonization surveillance program. It is not intended to diagnose MRSA infection nor to guide or monitor treatment for MRSA infections. Test performance is not FDA approved in patients less than 48 years old. Performed at [facility], [place] [address], [place], [place] [address]           Radiology Studies: No results found.      Scheduled Meds:  atorvastatin  80  mg Oral QHS   folic acid  1 mg Oral Daily   gabapentin  200 mg Oral QHS   insulin aspart  0-9 Units Subcutaneous Q6H   pantoprazole (PROTONIX) IV  40 mg Intravenous Q12H   sodium phosphate  1 enema Rectal Once   vitamin B-12  1,000 mcg Oral Daily   Continuous Infusions:  lactated ringers 10 mL/hr at 11/03/21 1919     LOS: 4 days        [name] Poisson, MD [facility]   To contact the attending provider between 7A-7P or the covering provider during after hours 7P-7A, please log into the web site [email or web address] and access using universal [place] password for that web site. If you do not have the password, please call the hospital operator.  11/07/2021, 3:17 PM

## 2021-11-08 ENCOUNTER — Inpatient Hospital Stay (HOSPITAL_COMMUNITY): Payer: HMO

## 2021-11-08 DIAGNOSIS — M7989 Other specified soft tissue disorders: Secondary | ICD-10-CM

## 2021-11-08 DIAGNOSIS — M79602 Pain in left arm: Secondary | ICD-10-CM

## 2021-11-08 DIAGNOSIS — D62 Acute posthemorrhagic anemia: Secondary | ICD-10-CM | POA: Diagnosis not present

## 2021-11-08 DIAGNOSIS — K922 Gastrointestinal hemorrhage, unspecified: Secondary | ICD-10-CM | POA: Diagnosis not present

## 2021-11-08 LAB — GLUCOSE, CAPILLARY
Glucose-Capillary: 113 mg/dL — ABNORMAL HIGH (ref 70–99)
Glucose-Capillary: 138 mg/dL — ABNORMAL HIGH (ref 70–99)

## 2021-11-08 MED ORDER — FOLIC ACID 1 MG PO TABS: 1.0000 mg | ORAL_TABLET | Freq: Every day | ORAL | Status: DC

## 2021-11-08 MED ORDER — CYANOCOBALAMIN 1000 MCG PO TABS: 1000.0000 ug | ORAL_TABLET | Freq: Every day | ORAL | 1 refills | Status: AC

## 2021-11-08 MED ORDER — PANTOPRAZOLE SODIUM 40 MG PO TBEC: 40.0000 mg | DELAYED_RELEASE_TABLET | Freq: Two times a day (BID) | ORAL | 3 refills | Status: DC

## 2021-11-08 NOTE — Discharge Summary (Signed)
Physician Discharge Summary  [name] YBO:[id number] DOB: Feb 19, 1956 DOA: 11/02/2021  PCP: Biagio Borg, MD  Admit date: 11/02/2021 Discharge date: 11/08/2021  Admitted From: HOME.  Disposition:  HOME.   Recommendations for Outpatient Follow-up:  Follow up with PCP in 1-2 weeks Please obtain BMP/CBC in one week Please follow up WITH Urology as scheduled.  Please follow up with Dr Burr Medico as recommended on 11/11/21    Discharge Condition: guarded.  CODE STATUS:full code.  Diet recommendation: Heart Healthy    Brief/Interim Summary: 65 year-old gentleman(Jehovah's Witness), prior history of hypertension, hyperlipidemia, pulmonary embolism on Coumadin and Lovenox, type 2 diabetes, prostate cancer, GERD, recently underwent transrectal ultrasound-guided prostate biopsy on 10/26/2021 secondary to elevated PSA presents to ED with significant bright red blood per rectum, some lightheadedness and dizziness.  Patient denies any NSAID use.   Patient underwent reversal with vitamin K in the ER embolization on 11/02/2021.  Patient continues to have bright red blood per rectum.  GI was consulted.  IR consulted and patient underwent mesenteric and pelvic angiogram with particle embolization of superior rectal artery with Gelfoam embolization of anterior division of the right internal iliac artery.   Urology, oncology, gastroenterology, general surgery, IR on board and appreciate recommendations.  Discharge Diagnoses:  Principal Problem:   GIB (gastrointestinal bleeding) Active Problems:   Hyperlipidemia   Depression   Essential hypertension   Pulmonary embolism (HCC)   Gross hematuria   Acute blood loss anemia   Esophageal reflux   History of pulmonary embolus (PE)   Diabetes mellitus type 2 in obese (HCC)   Chest pain   Hematochezia   Abnormal CT of the abdomen   Refusal of blood transfusions as patient is Jehovah's Witness  Acute GI bleed/acute blood loss anemia Probably secondary  to lower rectal bleed as noted on the CT angiogram of the abdomen and pelvis. Hemoglobin around 11.5 on admission dropped to 4.9, improved to 6.7 , dropped to 5.2 to 5.1.   Patient is s/p mesenteric and pelvic angiogram with particle embolization of superior rectal artery and Gelfoam embolization of the anterior addition of the right internal iliac artery. Patient is Jehovah's Witness and will not receive any blood products at this time. Hematology on board and recommending supportive care with IV iron, folic acid and I77 supplements. Hematology also recommending discontinuation of anticoagulation with Coumadin as patient's provoked PE/DVT was almost 15 years ago, without any recurrent clots.  He also received a dose of Epogen on 11/04/21.  Advanced diet as tolerated. Currently on soft diet.  Hemoglobin improvijg and trending upward to 5.7, . Pt had a normal BM prior to going home.  Recommend outpatient follow up with dr Burr Medico o 12/7 and check cbc then.        History of bilateral PE Patient noted to have provoked PE/DVT in 2007 and was started on anticoagulation. In light of the ongoing lower GI bleed, hematology recommending discontinuation of anticoagulation with Coumadin. Patient is agreeable to the plan.       New diagnosis of intermediate risk prostate cancer Urology on board and recommend outpatient follow-up. Pain control.        Chest pain Appears to have resolved. 2D echocardiogram showed left ventricular ejection fraction of 70% with no regional wall motion abnormalities, grade 1 diastolic dysfunction present.         Type 2 diabetes mellitus Hemoglobin A1c around 7.2 in October 2022          Essential hypertension Blood  pressure parameters are optimal.        Hyperlipidemia Continue with statin     Depression Patient was on Lexapro which is being held for now.     GERD Continue with PPI      Discharge Instructions  Discharge Instructions      Diet - low sodium heart healthy   Complete by: As directed    Increase activity slowly   Complete by: As directed    No wound care   Complete by: As directed       Allergies as of 11/08/2021       Reactions   Adhesive [tape] Other (See Comments)   Painful, Please use "paper" tape   Other    NO BLOOD -JEHOVAH'S WITNESS-SIGNED REFUSAL BUT WOULD TAKE ALBUMIN        Medication List     STOP taking these medications    chlorthalidone 25 MG tablet Commonly known as: HYGROTON   enoxaparin 120 MG/0.8ML injection Commonly known as: LOVENOX   escitalopram 10 MG tablet Commonly known as: LEXAPRO   fluticasone 50 MCG/ACT nasal spray Commonly known as: FLONASE   warfarin 10 MG tablet Commonly known as: COUMADIN       TAKE these medications    artificial tears ointment Place 1 drop into both eyes every 4 (four) hours as needed (dry eyes).   atorvastatin 80 MG tablet Commonly known as: LIPITOR Take 1 tablet (80 mg total) by mouth daily.   folic acid 1 MG tablet Commonly known as: FOLVITE Take 1 tablet (1 mg total) by mouth daily.   gabapentin 100 MG capsule Commonly known as: NEURONTIN Take 2 capsules (200 mg total) by mouth at bedtime.   glipiZIDE 2.5 MG 24 hr tablet Commonly known as: GLUCOTROL XL TAKE 1 TABLET (2.5 MG TOTAL) BY MOUTH DAILY WITH BREAKFAST.   metFORMIN 500 MG tablet Commonly known as: GLUCOPHAGE TAKE 4 TABLETS BY MOUTH DAILY WITH BREAKFAST What changed: See the new instructions.   multivitamin with minerals Tabs tablet Take 1 tablet by mouth daily at 12 noon.   nitroGLYCERIN 0.4 MG SL tablet Commonly known as: NITROSTAT Place 1 tablet (0.4 mg total) under the tongue every 5 (five) minutes as needed for chest pain.   pantoprazole 40 MG tablet Commonly known as: PROTONIX Take 1 tablet (40 mg total) by mouth 2 (two) times daily before a meal. What changed: when to take this   Vitamin B-12 5000 MCG Subl Place 5,000 mcg under the tongue 3  (three) times a week. What changed: Another medication with the same name was added. Make sure you understand how and when to take each.   cyanocobalamin 1000 MCG tablet Take 1 tablet (1,000 mcg total) by mouth daily. What changed: You were already taking a medication with the same name, and this prescription was added. Make sure you understand how and when to take each.   Vitamin D3 125 MCG (5000 UT) Caps Take 5,000 Units by mouth daily.        Follow-up Information     Biagio Borg, MD. Schedule an appointment as soon as possible for a visit in 1 week(s).   Specialties: Internal Medicine, Radiology Contact information: [place] [address] [phone]         Dorothy Spark, MD .   Specialty: Cardiology Contact information: [address] [place] [address] [phone]  Allergies  Allergen Reactions   Adhesive [Tape] Other (See Comments)    Painful, Please use "paper" tape   Other     NO BLOOD -JEHOVAH'S WITNESS-SIGNED REFUSAL BUT WOULD TAKE ALBUMIN    Consultations: Gastroenterology General surgery IR Dr. Serafina Royals  hematology. Dr Burr Medico.   Procedures/Studies: DG Lumbar Spine 2-3 Views  Result Date: 10/22/2021 CLINICAL DATA:  Lumbar spine pain. EXAM: LUMBAR SPINE - 2-3 VIEW COMPARISON:  Lumbar spine x-ray 11/22/2017 FINDINGS: Alignment is anatomic. There is no evidence for acute fracture. There is moderate disc space narrowing at L4-L5 and L5-S1 which is similar to the prior study. There are atherosclerotic calcifications of the aorta. There is a surgical clip in the left abdomen. There is a large amount of stool in the distal colon and rectum. IMPRESSION: Stable moderate degenerative changes at L4-L5 and L5-S1. Large amount of stool in the distal colon and rectum. Electronically Signed   By: Ronney Asters M.D.   On: 10/22/2021 20:43   IR Angiogram Visceral Selective  Result Date:  11/04/2021 INDICATION: 65 year old male with rectal hemorrhage status post recent transrectal prostate biopsy and status post right inferior rectal gelfoam embolization on 11/02/21 with recurrent hemorrhage and profound anemia. He refuses blood transfusion as he is Jehovah's Witness. Currently hemodynamically stable. EXAM: 1. Ultrasound-guided vascular access of the right common femoral artery. 2. Selective catheterization angiography of the inferior mesenteric artery, superior rectal artery, and anterior divisions of the bilateral internal iliac arteries 3. Particle embolization of the superior rectal artery. 4. Gel-Foam slurry embolization of the anterior division of the right internal iliac artery. MEDICATIONS: None. ANESTHESIA/SEDATION: Moderate (conscious) sedation was employed during this procedure. A total of Versed 2 mg and Fentanyl 100 mcg was administered intravenously. Moderate Sedation Time: 76 minutes. The patient's level of consciousness and vital signs were monitored continuously by radiology nursing throughout the procedure under my direct supervision. CONTRAST:  73mL OMNIPAQUE IOHEXOL 300 MG/ML SOLN, 64mL OMNIPAQUE IOHEXOL 300 MG/ML SOLN, 30mL OMNIPAQUE IOHEXOL 300 MG/ML SOLN FLUOROSCOPY TIME:  Fluoroscopy Time: 29 minutes 30 seconds (1,318 mGy). COMPLICATIONS: None immediate. PROCEDURE: Informed consent was obtained from the patient following explanation of the procedure, risks, benefits and alternatives. The patient understands, agrees and consents for the procedure. All questions were addressed. A time out was performed prior to the initiation of the procedure. Maximal barrier sterile technique utilized including caps, mask, sterile gowns, sterile gloves, large sterile drape, hand hygiene, and Betadine prep. The right groin was prepped and draped in standard fashion. Preprocedure ultrasound evaluation demonstrated patency of the right common femoral artery. A permanent image was captured and  stored in the record. Subdermal Local anesthesia was provided at the planned needle entry site with 1% lidocaine. A small skin nick was made. Under direct ultrasound visualization, the right common femoral artery is accessed with standard micropuncture technique. A limited right lower extremity angiogram was performed which demonstrated adequate puncture site for closure device use. A J wire was directed to the abdominal aorta exchanged for a 5 French, 11 cm vascular sheath. A Mickelson catheter was then used to select the inferior mesenteric artery ostium. Inferior mesenteric angiogram was performed which demonstrated patency of the IMA. A Cook cantata microcatheter and a synchro soft microwire were then inserted into the proximal IMA and directed to the superior rectal artery. Angiogram of the superior rectal artery was performed which demonstrated perfusion of the bilateral rectum without any evidence of active extravasation. There was a small focal focus of hyperemia  about the rectum which could represent etiology of hemorrhage. With the catheter parked at the bifurcation of the proximal superior rectal artery, particle embolization was performed using 500-700 micron embospheres. Near complete stasis was achieved. The microcatheter was removed and the Mickelson catheter was redirected to select the left common iliac artery. The catheter was exchanged over Glidewire for a C2 catheter. The C2 catheter was used to select the internal iliac artery. Internal iliac angiogram was performed which demonstrated patent anterior and posterior proximal branches and an inferior rectal artery arising from the proximal anterior division. Attempts at cannulating the inferior rectal artery were made with the microcatheter and microwire, however vessel spasm occurred in the inferior rectal artery was no longer visible. The microcatheter was removed and a Waltman loop was formed to redirect the C2 catheter into the right internal  iliac artery. Angiogram of the right internal iliac artery demonstrated patent anterior posterior division with the inferior rectal artery arising from the proximal aspect of the anterior division. The microcatheter and wire used to select the anterior division of the internal iliac artery. Position was confirmed with limited angiogram. A Gel-Foam slurry was made and embolization of the anterior division of the right internal iliac artery was performed. Upon completion of embolization, there is sluggish pulsatile antegrade flow with pruning of the distal branches including the inferior rectal artery. The microcatheter was removed and the C2 catheter was redirected to the common iliac artery and removed over a J wire. A 6 French Angio-Seal was then placed and deployed without complication or change in pulses. The patient tolerated the procedure well and remained stable throughout the procedure. The patient was transferred to the floor in stable condition. IMPRESSION: 1. Rectal hyperemia without evidence of active extravasation. 2. Prophylactic superior rectal artery particle embolization and Gelfoam embolization of the anterior division of the right common iliac artery Ruthann Cancer, MD [facility] Radiology Electronically Signed   By: Ruthann Cancer M.D.   On: 11/04/2021 13:14   IR Angiogram Visceral Selective  Result Date: 11/02/2021 INDICATION: 65 year old male status post recent transrectal prostate biopsy presenting with hematochezia, anemia, and CT arteriogram evidence of acute hemorrhage from the rectum. EXAM: 1. Ultrasound-guided vascular access of the right common femoral artery. 2. Abdominal aortogram. 3. Selective catheterization and angiography of the inferior mesenteric artery, superior rectal artery, bilateral internal iliac arteries, and bilateral inferior rectal arteries 4. Gel-Foam embolization of the distal right inferior rectal artery MEDICATIONS:  None. ANESTHESIA/SEDATION: Moderate (conscious) sedation was employed during this procedure. A total of Versed 0 mg and Fentanyl 0 mcg was administered intravenously. Moderate Sedation Time: 0 minutes. The patient's level of consciousness and vital signs were monitored continuously by radiology nursing throughout the procedure under my direct supervision. CONTRAST:  34mL OMNIPAQUE IOHEXOL 300 MG/ML SOLN, 32mL OMNIPAQUE IOHEXOL 300 MG/ML SOLN, 30mL OMNIPAQUE IOHEXOL 300 MG/ML SOLN FLUOROSCOPY TIME:  Fluoroscopy Time: 31 minutes 12 seconds (2,778 mGy). COMPLICATIONS: None immediate. PROCEDURE: Informed consent was obtained from the patient following explanation of the procedure, risks, benefits and alternatives. The patient understands, agrees and consents for the procedure. All questions were addressed. A time out was performed prior to the initiation of the procedure. Maximal barrier sterile technique utilized including caps, mask, sterile gowns, sterile gloves, large sterile drape, hand hygiene, and Betadine prep. The right groin was prepped and draped in standard fashion. Preprocedure ultrasound evaluation demonstrated patency of the right common femoral artery. A permanent image was captured and stored in the  record. Subdermal Local anesthesia was provided at the planned needle entry site with 1% lidocaine. A small skin nick was made. Under direct ultrasound visualization, the right common femoral artery was accessed with standard micropuncture technique. A limited right lower extremity angiogram was performed which demonstrated adequate puncture site for closure device use. A J wire was directed to the abdominal aorta and exchanged for a 5 French, 11 cm vascular sheath. A RIM catheter was introduced and was attempted to be used to engage the IMA however was unsuccessful. Abdominal aortogram was then performed in a steep obliquity with a pigtail flush catheter to demonstrate the ostium of the IMA. The pigtail  catheter was then exchanged for a Mickelson catheter which successfully engaged the IMA ostium. A Cook cantata microcatheter and a synchro soft microwire were then inserted into the proximal IMA. IMA angiogram was then performed which demonstrated patency of the superior rectal artery and its branches. There is mild hyperemia in the rectum without definite evidence of active extravasation. The microcatheter was removed and the Mickelson catheter was redirected to select the left common iliac artery. The catheter was exchanged over a Glidewire for a C2 catheter. The C2 catheter was uses likely internal iliac artery. Internal iliac angiogram was performed which demonstrated patent anterior and posterior proximal branches and in inferior rectal artery arising from the proximal anterior division. The microcatheter and wire were then used to select the inferior rectal artery proximally. Angiogram was performed which demonstrated no evidence of active extravasation. A Waltman loop was formed and the C2 catheter was used to select the right internal iliac artery. Angiogram of the right internal iliac artery demonstrated patent anterior posterior division within inferior rectal artery arising from the proximal aspect of the anterior division. The microcatheter and wire were used to select the inferior rectal artery. Angiogram demonstrated a possible focus of extravasation arising from a more superior branch of the distal inferior rectal artery. The distal branch was selected with the microcatheter. Additional angiogram was performed which demonstrated no evidence of active extravasation spilling into the rectum, however there was hyperemia and irregularity of the vessels in this region. Therefore, Gel-Foam embolization was performed using approximately 1 mL of Gel-Foam slurry. The catheter was retracted slightly and additional angiogram was performed which demonstrated complete embolization of the distal inferior rectal  artery and its branches. No persistent hyperemia was observed. The microcatheter was removed. The C2 catheter was advanced to un formed a Waltman loop and removed. A 6 French Angio-Seal was then placed and deployed without complication or change in pulses. The patient tolerated the procedure well and remained stable throughout the procedure. The patient was transferred to the floor in stable condition. IMPRESSION: 1. Possible focal extravasation versus hyperemia about a distal branch of the right inferior rectal artery. 2. Successful Gel-Foam slurry embolization of the distal right inferior rectal artery. Ruthann Cancer, MD [facility] Radiology Electronically Signed   By: Ruthann Cancer M.D.   On: 11/02/2021 16:47   IR Angiogram Pelvis Selective Or Supraselective  Result Date: 11/04/2021 INDICATION: 65 year old male with rectal hemorrhage status post recent transrectal prostate biopsy and status post right inferior rectal gelfoam embolization on 11/02/21 with recurrent hemorrhage and profound anemia. He refuses blood transfusion as he is Jehovah's Witness. Currently hemodynamically stable. EXAM: 1. Ultrasound-guided vascular access of the right common femoral artery. 2. Selective catheterization angiography of the inferior mesenteric artery, superior rectal artery, and anterior divisions of the bilateral internal iliac  arteries 3. Particle embolization of the superior rectal artery. 4. Gel-Foam slurry embolization of the anterior division of the right internal iliac artery. MEDICATIONS: None. ANESTHESIA/SEDATION: Moderate (conscious) sedation was employed during this procedure. A total of Versed 2 mg and Fentanyl 100 mcg was administered intravenously. Moderate Sedation Time: 76 minutes. The patient's level of consciousness and vital signs were monitored continuously by radiology nursing throughout the procedure under my direct supervision. CONTRAST:  4mL  OMNIPAQUE IOHEXOL 300 MG/ML SOLN, 69mL OMNIPAQUE IOHEXOL 300 MG/ML SOLN, 90mL OMNIPAQUE IOHEXOL 300 MG/ML SOLN FLUOROSCOPY TIME:  Fluoroscopy Time: 29 minutes 30 seconds (1,318 mGy). COMPLICATIONS: None immediate. PROCEDURE: Informed consent was obtained from the patient following explanation of the procedure, risks, benefits and alternatives. The patient understands, agrees and consents for the procedure. All questions were addressed. A time out was performed prior to the initiation of the procedure. Maximal barrier sterile technique utilized including caps, mask, sterile gowns, sterile gloves, large sterile drape, hand hygiene, and Betadine prep. The right groin was prepped and draped in standard fashion. Preprocedure ultrasound evaluation demonstrated patency of the right common femoral artery. A permanent image was captured and stored in the record. Subdermal Local anesthesia was provided at the planned needle entry site with 1% lidocaine. A small skin nick was made. Under direct ultrasound visualization, the right common femoral artery is accessed with standard micropuncture technique. A limited right lower extremity angiogram was performed which demonstrated adequate puncture site for closure device use. A J wire was directed to the abdominal aorta exchanged for a 5 French, 11 cm vascular sheath. A Mickelson catheter was then used to select the inferior mesenteric artery ostium. Inferior mesenteric angiogram was performed which demonstrated patency of the IMA. A Cook cantata microcatheter and a synchro soft microwire were then inserted into the proximal IMA and directed to the superior rectal artery. Angiogram of the superior rectal artery was performed which demonstrated perfusion of the bilateral rectum without any evidence of active extravasation. There was a small focal focus of hyperemia about the rectum which could represent etiology of hemorrhage. With the catheter parked at the bifurcation of the  proximal superior rectal artery, particle embolization was performed using 500-700 micron embospheres. Near complete stasis was achieved. The microcatheter was removed and the Mickelson catheter was redirected to select the left common iliac artery. The catheter was exchanged over Glidewire for a C2 catheter. The C2 catheter was used to select the internal iliac artery. Internal iliac angiogram was performed which demonstrated patent anterior and posterior proximal branches and an inferior rectal artery arising from the proximal anterior division. Attempts at cannulating the inferior rectal artery were made with the microcatheter and microwire, however vessel spasm occurred in the inferior rectal artery was no longer visible. The microcatheter was removed and a Waltman loop was formed to redirect the C2 catheter into the right internal iliac artery. Angiogram of the right internal iliac artery demonstrated patent anterior posterior division with the inferior rectal artery arising from the proximal aspect of the anterior division. The microcatheter and wire used to select the anterior division of the internal iliac artery. Position was confirmed with limited angiogram. A Gel-Foam slurry was made and embolization of the anterior division of the right internal iliac artery was performed. Upon completion of embolization, there is sluggish pulsatile antegrade flow with pruning of the distal branches including the inferior rectal artery. The microcatheter was removed and the C2 catheter was redirected to the common iliac artery and removed over a J  wire. A 6 French Angio-Seal was then placed and deployed without complication or change in pulses. The patient tolerated the procedure well and remained stable throughout the procedure. The patient was transferred to the floor in stable condition. IMPRESSION: 1. Rectal hyperemia without evidence of active extravasation. 2. Prophylactic superior rectal artery particle  embolization and Gelfoam embolization of the anterior division of the right common iliac artery Ruthann Cancer, MD [facility] Radiology Electronically Signed   By: Ruthann Cancer M.D.   On: 11/04/2021 13:14   IR Angiogram Pelvis Selective Or Supraselective  Result Date: 11/02/2021 INDICATION: 65 year old male status post recent transrectal prostate biopsy presenting with hematochezia, anemia, and CT arteriogram evidence of acute hemorrhage from the rectum. EXAM: 1. Ultrasound-guided vascular access of the right common femoral artery. 2. Abdominal aortogram. 3. Selective catheterization and angiography of the inferior mesenteric artery, superior rectal artery, bilateral internal iliac arteries, and bilateral inferior rectal arteries 4. Gel-Foam embolization of the distal right inferior rectal artery MEDICATIONS: None. ANESTHESIA/SEDATION: Moderate (conscious) sedation was employed during this procedure. A total of Versed 0 mg and Fentanyl 0 mcg was administered intravenously. Moderate Sedation Time: 0 minutes. The patient's level of consciousness and vital signs were monitored continuously by radiology nursing throughout the procedure under my direct supervision. CONTRAST:  93mL OMNIPAQUE IOHEXOL 300 MG/ML SOLN, 61mL OMNIPAQUE IOHEXOL 300 MG/ML SOLN, 63mL OMNIPAQUE IOHEXOL 300 MG/ML SOLN FLUOROSCOPY TIME:  Fluoroscopy Time: 31 minutes 12 seconds (2,778 mGy). COMPLICATIONS: None immediate. PROCEDURE: Informed consent was obtained from the patient following explanation of the procedure, risks, benefits and alternatives. The patient understands, agrees and consents for the procedure. All questions were addressed. A time out was performed prior to the initiation of the procedure. Maximal barrier sterile technique utilized including caps, mask, sterile gowns, sterile gloves, large sterile drape, hand hygiene, and Betadine prep. The right groin was prepped and draped in  standard fashion. Preprocedure ultrasound evaluation demonstrated patency of the right common femoral artery. A permanent image was captured and stored in the record. Subdermal Local anesthesia was provided at the planned needle entry site with 1% lidocaine. A small skin nick was made. Under direct ultrasound visualization, the right common femoral artery was accessed with standard micropuncture technique. A limited right lower extremity angiogram was performed which demonstrated adequate puncture site for closure device use. A J wire was directed to the abdominal aorta and exchanged for a 5 French, 11 cm vascular sheath. A RIM catheter was introduced and was attempted to be used to engage the IMA however was unsuccessful. Abdominal aortogram was then performed in a steep obliquity with a pigtail flush catheter to demonstrate the ostium of the IMA. The pigtail catheter was then exchanged for a Mickelson catheter which successfully engaged the IMA ostium. A Cook cantata microcatheter and a synchro soft microwire were then inserted into the proximal IMA. IMA angiogram was then performed which demonstrated patency of the superior rectal artery and its branches. There is mild hyperemia in the rectum without definite evidence of active extravasation. The microcatheter was removed and the Mickelson catheter was redirected to select the left common iliac artery. The catheter was exchanged over a Glidewire for a C2 catheter. The C2 catheter was uses likely internal iliac artery. Internal iliac angiogram was performed which demonstrated patent anterior and posterior proximal branches and in inferior rectal artery arising from the proximal anterior division. The microcatheter and wire were then used to select the inferior rectal artery proximally. Angiogram was performed  which demonstrated no evidence of active extravasation. A Waltman loop was formed and the C2 catheter was used to select the right internal iliac artery.  Angiogram of the right internal iliac artery demonstrated patent anterior posterior division within inferior rectal artery arising from the proximal aspect of the anterior division. The microcatheter and wire were used to select the inferior rectal artery. Angiogram demonstrated a possible focus of extravasation arising from a more superior branch of the distal inferior rectal artery. The distal branch was selected with the microcatheter. Additional angiogram was performed which demonstrated no evidence of active extravasation spilling into the rectum, however there was hyperemia and irregularity of the vessels in this region. Therefore, Gel-Foam embolization was performed using approximately 1 mL of Gel-Foam slurry. The catheter was retracted slightly and additional angiogram was performed which demonstrated complete embolization of the distal inferior rectal artery and its branches. No persistent hyperemia was observed. The microcatheter was removed. The C2 catheter was advanced to un formed a Waltman loop and removed. A 6 French Angio-Seal was then placed and deployed without complication or change in pulses. The patient tolerated the procedure well and remained stable throughout the procedure. The patient was transferred to the floor in stable condition. IMPRESSION: 1. Possible focal extravasation versus hyperemia about a distal branch of the right inferior rectal artery. 2. Successful Gel-Foam slurry embolization of the distal right inferior rectal artery. Ruthann Cancer, MD [facility] Electronically Signed   By: Ruthann Cancer M.D.   On: 11/02/2021 16:47   IR Angiogram Selective Each Additional Vessel  Result Date: 11/04/2021 INDICATION: 65 year old male with rectal hemorrhage status post recent transrectal prostate biopsy and status post right inferior rectal gelfoam embolization on 11/02/21 with recurrent hemorrhage and profound anemia. He refuses  blood transfusion as he is Jehovah's Witness. Currently hemodynamically stable. EXAM: 1. Ultrasound-guided vascular access of the right common femoral artery. 2. Selective catheterization angiography of the inferior mesenteric artery, superior rectal artery, and anterior divisions of the bilateral internal iliac arteries 3. Particle embolization of the superior rectal artery. 4. Gel-Foam slurry embolization of the anterior division of the right internal iliac artery. MEDICATIONS: None. ANESTHESIA/SEDATION: Moderate (conscious) sedation was employed during this procedure. A total of Versed 2 mg and Fentanyl 100 mcg was administered intravenously. Moderate Sedation Time: 76 minutes. The patient's level of consciousness and vital signs were monitored continuously by radiology nursing throughout the procedure under my direct supervision. CONTRAST:  21mL OMNIPAQUE IOHEXOL 300 MG/ML SOLN, 85mL OMNIPAQUE IOHEXOL 300 MG/ML SOLN, 54mL OMNIPAQUE IOHEXOL 300 MG/ML SOLN FLUOROSCOPY TIME:  Fluoroscopy Time: 29 minutes 30 seconds (1,318 mGy). COMPLICATIONS: None immediate. PROCEDURE: Informed consent was obtained from the patient following explanation of the procedure, risks, benefits and alternatives. The patient understands, agrees and consents for the procedure. All questions were addressed. A time out was performed prior to the initiation of the procedure. Maximal barrier sterile technique utilized including caps, mask, sterile gowns, sterile gloves, large sterile drape, hand hygiene, and Betadine prep. The right groin was prepped and draped in standard fashion. Preprocedure ultrasound evaluation demonstrated patency of the right common femoral artery. A permanent image was captured and stored in the record. Subdermal Local anesthesia was provided at the planned needle entry site with 1% lidocaine. A small skin nick was made. Under direct ultrasound visualization, the right common femoral artery is accessed with standard  micropuncture technique. A limited right lower extremity angiogram was performed which demonstrated adequate puncture site for  closure device use. A J wire was directed to the abdominal aorta exchanged for a 5 French, 11 cm vascular sheath. A Mickelson catheter was then used to select the inferior mesenteric artery ostium. Inferior mesenteric angiogram was performed which demonstrated patency of the IMA. A Cook cantata microcatheter and a synchro soft microwire were then inserted into the proximal IMA and directed to the superior rectal artery. Angiogram of the superior rectal artery was performed which demonstrated perfusion of the bilateral rectum without any evidence of active extravasation. There was a small focal focus of hyperemia about the rectum which could represent etiology of hemorrhage. With the catheter parked at the bifurcation of the proximal superior rectal artery, particle embolization was performed using 500-700 micron embospheres. Near complete stasis was achieved. The microcatheter was removed and the Mickelson catheter was redirected to select the left common iliac artery. The catheter was exchanged over Glidewire for a C2 catheter. The C2 catheter was used to select the internal iliac artery. Internal iliac angiogram was performed which demonstrated patent anterior and posterior proximal branches and an inferior rectal artery arising from the proximal anterior division. Attempts at cannulating the inferior rectal artery were made with the microcatheter and microwire, however vessel spasm occurred in the inferior rectal artery was no longer visible. The microcatheter was removed and a Waltman loop was formed to redirect the C2 catheter into the right internal iliac artery. Angiogram of the right internal iliac artery demonstrated patent anterior posterior division with the inferior rectal artery arising from the proximal aspect of the anterior division. The microcatheter and wire used to select  the anterior division of the internal iliac artery. Position was confirmed with limited angiogram. A Gel-Foam slurry was made and embolization of the anterior division of the right internal iliac artery was performed. Upon completion of embolization, there is sluggish pulsatile antegrade flow with pruning of the distal branches including the inferior rectal artery. The microcatheter was removed and the C2 catheter was redirected to the common iliac artery and removed over a J wire. A 6 French Angio-Seal was then placed and deployed without complication or change in pulses. The patient tolerated the procedure well and remained stable throughout the procedure. The patient was transferred to the floor in stable condition. IMPRESSION: 1. Rectal hyperemia without evidence of active extravasation. 2. Prophylactic superior rectal artery particle embolization and Gelfoam embolization of the anterior division of the right common iliac artery Ruthann Cancer, MD [facility] Rad[facility] Electronically Signed   By: Ruthann Cancer M.D.   On: 11/04/2021 13:14   IR Angiogram Selective Each Additional Vessel  Result Date: 11/04/2021 INDICATION: 65 year old male with rectal hemorrhage status post recent transrectal prostate biopsy and status post right inferior rectal gelfoam embolization on 11/02/21 with recurrent hemorrhage and profound anemia. He refuses blood transfusion as he is Jehovah's Witness. Currently hemodynamically stable. EXAM: 1. Ultrasound-guided vascular access of the right common femoral artery. 2. Selective catheterization angiography of the inferior mesenteric artery, superior rectal artery, and anterior divisions of the bilateral internal iliac arteries 3. Particle embolization of the superior rectal artery. 4. Gel-Foam slurry embolization of the anterior division of the right internal iliac artery. MEDICATIONS: None. ANESTHESIA/SEDATION: Moderate (conscious)  sedation was employed during this procedure. A total of Versed 2 mg and Fentanyl 100 mcg was administered intravenously. Moderate Sedation Time: 76 minutes. The patient's level of consciousness and vital signs were monitored continuously by radiology nursing throughout the procedure under my direct supervision.  CONTRAST:  61mL OMNIPAQUE IOHEXOL 300 MG/ML SOLN, 70mL OMNIPAQUE IOHEXOL 300 MG/ML SOLN, 14mL OMNIPAQUE IOHEXOL 300 MG/ML SOLN FLUOROSCOPY TIME:  Fluoroscopy Time: 29 minutes 30 seconds (1,318 mGy). COMPLICATIONS: None immediate. PROCEDURE: Informed consent was obtained from the patient following explanation of the procedure, risks, benefits and alternatives. The patient understands, agrees and consents for the procedure. All questions were addressed. A time out was performed prior to the initiation of the procedure. Maximal barrier sterile technique utilized including caps, mask, sterile gowns, sterile gloves, large sterile drape, hand hygiene, and Betadine prep. The right groin was prepped and draped in standard fashion. Preprocedure ultrasound evaluation demonstrated patency of the right common femoral artery. A permanent image was captured and stored in the record. Subdermal Local anesthesia was provided at the planned needle entry site with 1% lidocaine. A small skin nick was made. Under direct ultrasound visualization, the right common femoral artery is accessed with standard micropuncture technique. A limited right lower extremity angiogram was performed which demonstrated adequate puncture site for closure device use. A J wire was directed to the abdominal aorta exchanged for a 5 French, 11 cm vascular sheath. A Mickelson catheter was then used to select the inferior mesenteric artery ostium. Inferior mesenteric angiogram was performed which demonstrated patency of the IMA. A Cook cantata microcatheter and a synchro soft microwire were then inserted into the proximal IMA and directed to the superior  rectal artery. Angiogram of the superior rectal artery was performed which demonstrated perfusion of the bilateral rectum without any evidence of active extravasation. There was a small focal focus of hyperemia about the rectum which could represent etiology of hemorrhage. With the catheter parked at the bifurcation of the proximal superior rectal artery, particle embolization was performed using 500-700 micron embospheres. Near complete stasis was achieved. The microcatheter was removed and the Mickelson catheter was redirected to select the left common iliac artery. The catheter was exchanged over Glidewire for a C2 catheter. The C2 catheter was used to select the internal iliac artery. Internal iliac angiogram was performed which demonstrated patent anterior and posterior proximal branches and an inferior rectal artery arising from the proximal anterior division. Attempts at cannulating the inferior rectal artery were made with the microcatheter and microwire, however vessel spasm occurred in the inferior rectal artery was no longer visible. The microcatheter was removed and a Waltman loop was formed to redirect the C2 catheter into the right internal iliac artery. Angiogram of the right internal iliac artery demonstrated patent anterior posterior division with the inferior rectal artery arising from the proximal aspect of the anterior division. The microcatheter and wire used to select the anterior division of the internal iliac artery. Position was confirmed with limited angiogram. A Gel-Foam slurry was made and embolization of the anterior division of the right internal iliac artery was performed. Upon completion of embolization, there is sluggish pulsatile antegrade flow with pruning of the distal branches including the inferior rectal artery. The microcatheter was removed and the C2 catheter was redirected to the common iliac artery and removed over a J wire. A 6 French Angio-Seal was then placed and  deployed without complication or change in pulses. The patient tolerated the procedure well and remained stable throughout the procedure. The patient was transferred to the floor in stable condition. IMPRESSION: 1. Rectal hyperemia without evidence of active extravasation. 2. Prophylactic superior rectal artery particle embolization and Gelfoam embolization of the anterior division of the right common iliac artery Ruthann Cancer, MD [facility] Radiology Electronically Signed   By: Ruthann Cancer M.D.   On: 11/04/2021 13:14   IR Angiogram Selective Each Additional Vessel  Result Date: 11/04/2021 INDICATION: 65 year old male with rectal hemorrhage status post recent transrectal prostate biopsy and status post right inferior rectal gelfoam embolization on 11/02/21 with recurrent hemorrhage and profound anemia. He refuses blood transfusion as he is Jehovah's Witness. Currently hemodynamically stable. EXAM: 1. Ultrasound-guided vascular access of the right common femoral artery. 2. Selective catheterization angiography of the inferior mesenteric artery, superior rectal artery, and anterior divisions of the bilateral internal iliac arteries 3. Particle embolization of the superior rectal artery. 4. Gel-Foam slurry embolization of the anterior division of the right internal iliac artery. MEDICATIONS: None. ANESTHESIA/SEDATION: Moderate (conscious) sedation was employed during this procedure. A total of Versed 2 mg and Fentanyl 100 mcg was administered intravenously. Moderate Sedation Time: 76 minutes. The patient's level of consciousness and vital signs were monitored continuously by radiology nursing throughout the procedure under my direct supervision. CONTRAST:  68mL OMNIPAQUE IOHEXOL 300 MG/ML SOLN, 97mL OMNIPAQUE IOHEXOL 300 MG/ML SOLN, 53mL OMNIPAQUE IOHEXOL 300 MG/ML SOLN FLUOROSCOPY TIME:  Fluoroscopy Time: 29 minutes 30 seconds (1,318 mGy). COMPLICATIONS: None  immediate. PROCEDURE: Informed consent was obtained from the patient following explanation of the procedure, risks, benefits and alternatives. The patient understands, agrees and consents for the procedure. All questions were addressed. A time out was performed prior to the initiation of the procedure. Maximal barrier sterile technique utilized including caps, mask, sterile gowns, sterile gloves, large sterile drape, hand hygiene, and Betadine prep. The right groin was prepped and draped in standard fashion. Preprocedure ultrasound evaluation demonstrated patency of the right common femoral artery. A permanent image was captured and stored in the record. Subdermal Local anesthesia was provided at the planned needle entry site with 1% lidocaine. A small skin nick was made. Under direct ultrasound visualization, the right common femoral artery is accessed with standard micropuncture technique. A limited right lower extremity angiogram was performed which demonstrated adequate puncture site for closure device use. A J wire was directed to the abdominal aorta exchanged for a 5 French, 11 cm vascular sheath. A Mickelson catheter was then used to select the inferior mesenteric artery ostium. Inferior mesenteric angiogram was performed which demonstrated patency of the IMA. A Cook cantata microcatheter and a synchro soft microwire were then inserted into the proximal IMA and directed to the superior rectal artery. Angiogram of the superior rectal artery was performed which demonstrated perfusion of the bilateral rectum without any evidence of active extravasation. There was a small focal focus of hyperemia about the rectum which could represent etiology of hemorrhage. With the catheter parked at the bifurcation of the proximal superior rectal artery, particle embolization was performed using 500-700 micron embospheres. Near complete stasis was achieved. The microcatheter was removed and the Mickelson catheter was  redirected to select the left common iliac artery. The catheter was exchanged over Glidewire for a C2 catheter. The C2 catheter was used to select the internal iliac artery. Internal iliac angiogram was performed which demonstrated patent anterior and posterior proximal branches and an inferior rectal artery arising from the proximal anterior division. Attempts at cannulating the inferior rectal artery were made with the microcatheter and microwire, however vessel spasm occurred in the inferior rectal artery was no longer visible. The microcatheter was removed and a Waltman loop was formed to redirect the C2 catheter into the right internal iliac artery. Angiogram of the right  internal iliac artery demonstrated patent anterior posterior division with the inferior rectal artery arising from the proximal aspect of the anterior division. The microcatheter and wire used to select the anterior division of the internal iliac artery. Position was confirmed with limited angiogram. A Gel-Foam slurry was made and embolization of the anterior division of the right internal iliac artery was performed. Upon completion of embolization, there is sluggish pulsatile antegrade flow with pruning of the distal branches including the inferior rectal artery. The microcatheter was removed and the C2 catheter was redirected to the common iliac artery and removed over a J wire. A 6 French Angio-Seal was then placed and deployed without complication or change in pulses. The patient tolerated the procedure well and remained stable throughout the procedure. The patient was transferred to the floor in stable condition. IMPRESSION: 1. Rectal hyperemia without evidence of active extravasation. 2. Prophylactic superior rectal artery particle embolization and Gelfoam embolization of the anterior division of the right common iliac artery Ruthann Cancer, MD [facility] Radiology Electronically Signed    By: Ruthann Cancer M.D.   On: 11/04/2021 13:14   IR Angiogram Selective Each Additional Vessel  Result Date: 11/04/2021 INDICATION: 65 year old male with rectal hemorrhage status post recent transrectal prostate biopsy and status post right inferior rectal gelfoam embolization on 11/02/21 with recurrent hemorrhage and profound anemia. He refuses blood transfusion as he is Jehovah's Witness. Currently hemodynamically stable. EXAM: 1. Ultrasound-guided vascular access of the right common femoral artery. 2. Selective catheterization angiography of the inferior mesenteric artery, superior rectal artery, and anterior divisions of the bilateral internal iliac arteries 3. Particle embolization of the superior rectal artery. 4. Gel-Foam slurry embolization of the anterior division of the right internal iliac artery. MEDICATIONS: None. ANESTHESIA/SEDATION: Moderate (conscious) sedation was employed during this procedure. A total of Versed 2 mg and Fentanyl 100 mcg was administered intravenously. Moderate Sedation Time: 76 minutes. The patient's level of consciousness and vital signs were monitored continuously by radiology nursing throughout the procedure under my direct supervision. CONTRAST:  12mL OMNIPAQUE IOHEXOL 300 MG/ML SOLN, 36mL OMNIPAQUE IOHEXOL 300 MG/ML SOLN, 56mL OMNIPAQUE IOHEXOL 300 MG/ML SOLN FLUOROSCOPY TIME:  Fluoroscopy Time: 29 minutes 30 seconds (1,318 mGy). COMPLICATIONS: None immediate. PROCEDURE: Informed consent was obtained from the patient following explanation of the procedure, risks, benefits and alternatives. The patient understands, agrees and consents for the procedure. All questions were addressed. A time out was performed prior to the initiation of the procedure. Maximal barrier sterile technique utilized including caps, mask, sterile gowns, sterile gloves, large sterile drape, hand hygiene, and Betadine prep. The right groin was prepped and draped in standard fashion. Preprocedure  ultrasound evaluation demonstrated patency of the right common femoral artery. A permanent image was captured and stored in the record. Subdermal Local anesthesia was provided at the planned needle entry site with 1% lidocaine. A small skin nick was made. Under direct ultrasound visualization, the right common femoral artery is accessed with standard micropuncture technique. A limited right lower extremity angiogram was performed which demonstrated adequate puncture site for closure device use. A J wire was directed to the abdominal aorta exchanged for a 5 French, 11 cm vascular sheath. A Mickelson catheter was then used to select the inferior mesenteric artery ostium. Inferior mesenteric angiogram was performed which demonstrated patency of the IMA. A Cook cantata microcatheter and a synchro soft microwire were then inserted into the proximal IMA and directed to the superior rectal artery. Angiogram of  the superior rectal artery was performed which demonstrated perfusion of the bilateral rectum without any evidence of active extravasation. There was a small focal focus of hyperemia about the rectum which could represent etiology of hemorrhage. With the catheter parked at the bifurcation of the proximal superior rectal artery, particle embolization was performed using 500-700 micron embospheres. Near complete stasis was achieved. The microcatheter was removed and the Mickelson catheter was redirected to select the left common iliac artery. The catheter was exchanged over Glidewire for a C2 catheter. The C2 catheter was used to select the internal iliac artery. Internal iliac angiogram was performed which demonstrated patent anterior and posterior proximal branches and an inferior rectal artery arising from the proximal anterior division. Attempts at cannulating the inferior rectal artery were made with the microcatheter and microwire, however vessel spasm occurred in the inferior rectal artery was no longer visible.  The microcatheter was removed and a Waltman loop was formed to redirect the C2 catheter into the right internal iliac artery. Angiogram of the right internal iliac artery demonstrated patent anterior posterior division with the inferior rectal artery arising from the proximal aspect of the anterior division. The microcatheter and wire used to select the anterior division of the internal iliac artery. Position was confirmed with limited angiogram. A Gel-Foam slurry was made and embolization of the anterior division of the right internal iliac artery was performed. Upon completion of embolization, there is sluggish pulsatile antegrade flow with pruning of the distal branches including the inferior rectal artery. The microcatheter was removed and the C2 catheter was redirected to the common iliac artery and removed over a J wire. A 6 French Angio-Seal was then placed and deployed without complication or change in pulses. The patient tolerated the procedure well and remained stable throughout the procedure. The patient was transferred to the floor in stable condition. IMPRESSION: 1. Rectal hyperemia without evidence of active extravasation. 2. Prophylactic superior rectal artery particle embolization and Gelfoam embolization of the anterior division of the right common iliac artery Ruthann Cancer, MD [facility] Radiology Electronically Signed   By: Ruthann Cancer M.D.   On: 11/04/2021 13:14   IR Angiogram Selective Each Additional Vessel  Result Date: 11/02/2021 INDICATION: 65 year old male status post recent transrectal prostate biopsy presenting with hematochezia, anemia, and CT arteriogram evidence of acute hemorrhage from the rectum. EXAM: 1. Ultrasound-guided vascular access of the right common femoral artery. 2. Abdominal aortogram. 3. Selective catheterization and angiography of the inferior mesenteric artery, superior rectal artery, bilateral internal iliac  arteries, and bilateral inferior rectal arteries 4. Gel-Foam embolization of the distal right inferior rectal artery MEDICATIONS: None. ANESTHESIA/SEDATION: Moderate (conscious) sedation was employed during this procedure. A total of Versed 0 mg and Fentanyl 0 mcg was administered intravenously. Moderate Sedation Time: 0 minutes. The patient's level of consciousness and vital signs were monitored continuously by radiology nursing throughout the procedure under my direct supervision. CONTRAST:  6mL OMNIPAQUE IOHEXOL 300 MG/ML SOLN, 10mL OMNIPAQUE IOHEXOL 300 MG/ML SOLN, 8mL OMNIPAQUE IOHEXOL 300 MG/ML SOLN FLUOROSCOPY TIME:  Fluoroscopy Time: 31 minutes 12 seconds (2,778 mGy). COMPLICATIONS: None immediate. PROCEDURE: Informed consent was obtained from the patient following explanation of the procedure, risks, benefits and alternatives. The patient understands, agrees and consents for the procedure. All questions were addressed. A time out was performed prior to the initiation of the procedure. Maximal barrier sterile technique utilized including caps, mask, sterile gowns, sterile gloves, large sterile drape, hand hygiene, and Betadine prep. The  right groin was prepped and draped in standard fashion. Preprocedure ultrasound evaluation demonstrated patency of the right common femoral artery. A permanent image was captured and stored in the record. Subdermal Local anesthesia was provided at the planned needle entry site with 1% lidocaine. A small skin nick was made. Under direct ultrasound visualization, the right common femoral artery was accessed with standard micropuncture technique. A limited right lower extremity angiogram was performed which demonstrated adequate puncture site for closure device use. A J wire was directed to the abdominal aorta and exchanged for a 5 French, 11 cm vascular sheath. A RIM catheter was introduced and was attempted to be used to engage the IMA however was unsuccessful. Abdominal  aortogram was then performed in a steep obliquity with a pigtail flush catheter to demonstrate the ostium of the IMA. The pigtail catheter was then exchanged for a Mickelson catheter which successfully engaged the IMA ostium. A Cook cantata microcatheter and a synchro soft microwire were then inserted into the proximal IMA. IMA angiogram was then performed which demonstrated patency of the superior rectal artery and its branches. There is mild hyperemia in the rectum without definite evidence of active extravasation. The microcatheter was removed and the Mickelson catheter was redirected to select the left common iliac artery. The catheter was exchanged over a Glidewire for a C2 catheter. The C2 catheter was uses likely internal iliac artery. Internal iliac angiogram was performed which demonstrated patent anterior and posterior proximal branches and in inferior rectal artery arising from the proximal anterior division. The microcatheter and wire were then used to select the inferior rectal artery proximally. Angiogram was performed which demonstrated no evidence of active extravasation. A Waltman loop was formed and the C2 catheter was used to select the right internal iliac artery. Angiogram of the right internal iliac artery demonstrated patent anterior posterior division within inferior rectal artery arising from the proximal aspect of the anterior division. The microcatheter and wire were used to select the inferior rectal artery. Angiogram demonstrated a possible focus of extravasation arising from a more superior branch of the distal inferior rectal artery. The distal branch was selected with the microcatheter. Additional angiogram was performed which demonstrated no evidence of active extravasation spilling into the rectum, however there was hyperemia and irregularity of the vessels in this region. Therefore, Gel-Foam embolization was performed using approximately 1 mL of Gel-Foam slurry. The catheter was  retracted slightly and additional angiogram was performed which demonstrated complete embolization of the distal inferior rectal artery and its branches. No persistent hyperemia was observed. The microcatheter was removed. The C2 catheter was advanced to un formed a Waltman loop and removed. A 6 French Angio-Seal was then placed and deployed without complication or change in pulses. The patient tolerated the procedure well and remained stable throughout the procedure. The patient was transferred to the floor in stable condition. IMPRESSION: 1. Possible focal extravasation versus hyperemia about a distal branch of the right inferior rectal artery. 2. Successful Gel-Foam slurry embolization of the distal right inferior rectal artery. Ruthann Cancer, MD [facility] Radiology Electronically Signed   By: Ruthann Cancer M.D.   On: 11/02/2021 16:47   IR Angiogram Selective Each Additional Vessel  Result Date: 11/02/2021 INDICATION: 65 year old male status post recent transrectal prostate biopsy presenting with hematochezia, anemia, and CT arteriogram evidence of acute hemorrhage from the rectum. EXAM: 1. Ultrasound-guided vascular access of the right common femoral artery. 2. Abdominal aortogram. 3. Selective catheterization and angiography of the  inferior mesenteric artery, superior rectal artery, bilateral internal iliac arteries, and bilateral inferior rectal arteries 4. Gel-Foam embolization of the distal right inferior rectal artery MEDICATIONS: None. ANESTHESIA/SEDATION: Moderate (conscious) sedation was employed during this procedure. A total of Versed 0 mg and Fentanyl 0 mcg was administered intravenously. Moderate Sedation Time: 0 minutes. The patient's level of consciousness and vital signs were monitored continuously by radiology nursing throughout the procedure under my direct supervision. CONTRAST:  17mL OMNIPAQUE IOHEXOL 300 MG/ML SOLN, 72mL OMNIPAQUE IOHEXOL  300 MG/ML SOLN, 68mL OMNIPAQUE IOHEXOL 300 MG/ML SOLN FLUOROSCOPY TIME:  Fluoroscopy Time: 31 minutes 12 seconds (2,778 mGy). COMPLICATIONS: None immediate. PROCEDURE: Informed consent was obtained from the patient following explanation of the procedure, risks, benefits and alternatives. The patient understands, agrees and consents for the procedure. All questions were addressed. A time out was performed prior to the initiation of the procedure. Maximal barrier sterile technique utilized including caps, mask, sterile gowns, sterile gloves, large sterile drape, hand hygiene, and Betadine prep. The right groin was prepped and draped in standard fashion. Preprocedure ultrasound evaluation demonstrated patency of the right common femoral artery. A permanent image was captured and stored in the record. Subdermal Local anesthesia was provided at the planned needle entry site with 1% lidocaine. A small skin nick was made. Under direct ultrasound visualization, the right common femoral artery was accessed with standard micropuncture technique. A limited right lower extremity angiogram was performed which demonstrated adequate puncture site for closure device use. A J wire was directed to the abdominal aorta and exchanged for a 5 French, 11 cm vascular sheath. A RIM catheter was introduced and was attempted to be used to engage the IMA however was unsuccessful. Abdominal aortogram was then performed in a steep obliquity with a pigtail flush catheter to demonstrate the ostium of the IMA. The pigtail catheter was then exchanged for a Mickelson catheter which successfully engaged the IMA ostium. A Cook cantata microcatheter and a synchro soft microwire were then inserted into the proximal IMA. IMA angiogram was then performed which demonstrated patency of the superior rectal artery and its branches. There is mild hyperemia in the rectum without definite evidence of active extravasation. The microcatheter was removed and the  Mickelson catheter was redirected to select the left common iliac artery. The catheter was exchanged over a Glidewire for a C2 catheter. The C2 catheter was uses likely internal iliac artery. Internal iliac angiogram was performed which demonstrated patent anterior and posterior proximal branches and in inferior rectal artery arising from the proximal anterior division. The microcatheter and wire were then used to select the inferior rectal artery proximally. Angiogram was performed which demonstrated no evidence of active extravasation. A Waltman loop was formed and the C2 catheter was used to select the right internal iliac artery. Angiogram of the right internal iliac artery demonstrated patent anterior posterior division within inferior rectal artery arising from the proximal aspect of the anterior division. The microcatheter and wire were used to select the inferior rectal artery. Angiogram demonstrated a possible focus of extravasation arising from a more superior branch of the distal inferior rectal artery. The distal branch was selected with the microcatheter. Additional angiogram was performed which demonstrated no evidence of active extravasation spilling into the rectum, however there was hyperemia and irregularity of the vessels in this region. Therefore, Gel-Foam embolization was performed using approximately 1 mL of Gel-Foam slurry. The catheter was retracted slightly and additional angiogram was performed which demonstrated complete embolization of the distal inferior rectal  artery and its branches. No persistent hyperemia was observed. The microcatheter was removed. The C2 catheter was advanced to un formed a Waltman loop and removed. A 6 French Angio-Seal was then placed and deployed without complication or change in pulses. The patient tolerated the procedure well and remained stable throughout the procedure. The patient was transferred to the floor in stable condition. IMPRESSION: 1. Possible  focal extravasation versus hyperemia about a distal branch of the right inferior rectal artery. 2. Successful Gel-Foam slurry embolization of the distal right inferior rectal artery. Ruthann Cancer, MD [facility] Radiology Electronically Signed   By: Ruthann Cancer M.D.   On: 11/02/2021 16:47   IR Angiogram Selective Each Additional Vessel  Result Date: 11/02/2021 INDICATION: 65 year old male status post recent transrectal prostate biopsy presenting with hematochezia, anemia, and CT arteriogram evidence of acute hemorrhage from the rectum. EXAM: 1. Ultrasound-guided vascular access of the right common femoral artery. 2. Abdominal aortogram. 3. Selective catheterization and angiography of the inferior mesenteric artery, superior rectal artery, bilateral internal iliac arteries, and bilateral inferior rectal arteries 4. Gel-Foam embolization of the distal right inferior rectal artery MEDICATIONS: None. ANESTHESIA/SEDATION: Moderate (conscious) sedation was employed during this procedure. A total of Versed 0 mg and Fentanyl 0 mcg was administered intravenously. Moderate Sedation Time: 0 minutes. The patient's level of consciousness and vital signs were monitored continuously by radiology nursing throughout the procedure under my direct supervision. CONTRAST:  45mL OMNIPAQUE IOHEXOL 300 MG/ML SOLN, 84mL OMNIPAQUE IOHEXOL 300 MG/ML SOLN, 31mL OMNIPAQUE IOHEXOL 300 MG/ML SOLN FLUOROSCOPY TIME:  Fluoroscopy Time: 31 minutes 12 seconds (2,778 mGy). COMPLICATIONS: None immediate. PROCEDURE: Informed consent was obtained from the patient following explanation of the procedure, risks, benefits and alternatives. The patient understands, agrees and consents for the procedure. All questions were addressed. A time out was performed prior to the initiation of the procedure. Maximal barrier sterile technique utilized including caps, mask, sterile gowns, sterile gloves, large  sterile drape, hand hygiene, and Betadine prep. The right groin was prepped and draped in standard fashion. Preprocedure ultrasound evaluation demonstrated patency of the right common femoral artery. A permanent image was captured and stored in the record. Subdermal Local anesthesia was provided at the planned needle entry site with 1% lidocaine. A small skin nick was made. Under direct ultrasound visualization, the right common femoral artery was accessed with standard micropuncture technique. A limited right lower extremity angiogram was performed which demonstrated adequate puncture site for closure device use. A J wire was directed to the abdominal aorta and exchanged for a 5 French, 11 cm vascular sheath. A RIM catheter was introduced and was attempted to be used to engage the IMA however was unsuccessful. Abdominal aortogram was then performed in a steep obliquity with a pigtail flush catheter to demonstrate the ostium of the IMA. The pigtail catheter was then exchanged for a Mickelson catheter which successfully engaged the IMA ostium. A Cook cantata microcatheter and a synchro soft microwire were then inserted into the proximal IMA. IMA angiogram was then performed which demonstrated patency of the superior rectal artery and its branches. There is mild hyperemia in the rectum without definite evidence of active extravasation. The microcatheter was removed and the Mickelson catheter was redirected to select the left common iliac artery. The catheter was exchanged over a Glidewire for a C2 catheter. The C2 catheter was uses likely internal iliac artery. Internal iliac angiogram was performed which demonstrated patent anterior and posterior proximal branches and in inferior  rectal artery arising from the proximal anterior division. The microcatheter and wire were then used to select the inferior rectal artery proximally. Angiogram was performed which demonstrated no evidence of active extravasation. A Waltman  loop was formed and the C2 catheter was used to select the right internal iliac artery. Angiogram of the right internal iliac artery demonstrated patent anterior posterior division within inferior rectal artery arising from the proximal aspect of the anterior division. The microcatheter and wire were used to select the inferior rectal artery. Angiogram demonstrated a possible focus of extravasation arising from a more superior branch of the distal inferior rectal artery. The distal branch was selected with the microcatheter. Additional angiogram was performed which demonstrated no evidence of active extravasation spilling into the rectum, however there was hyperemia and irregularity of the vessels in this region. Therefore, Gel-Foam embolization was performed using approximately 1 mL of Gel-Foam slurry. The catheter was retracted slightly and additional angiogram was performed which demonstrated complete embolization of the distal inferior rectal artery and its branches. No persistent hyperemia was observed. The microcatheter was removed. The C2 catheter was advanced to un formed a Waltman loop and removed. A 6 French Angio-Seal was then placed and deployed without complication or change in pulses. The patient tolerated the procedure well and remained stable throughout the procedure. The patient was transferred to the floor in stable condition. IMPRESSION: 1. Possible focal extravasation versus hyperemia about a distal branch of the right inferior rectal artery. 2. Successful Gel-Foam slurry embolization of the distal right inferior rectal artery. Ruthann Cancer, MD [facility] Special[facility] Radiology Electronically Signed   By: Ruthann Cancer M.D.   On: 11/02/2021 16:47   IR US Guide Vasc Access Right  Result Date: 11/04/2021 INDICATION: 65 year old male with rectal hemorrhage status post recent transrectal prostate biopsy and status post right inferior rectal gelfoam  embolization on 11/02/21 with recurrent hemorrhage and profound anemia. He refuses blood transfusion as he is [employer]. Currently hemodynamically stable. EXAM: 1. Ultrasound-guided vascular access of the right common femoral artery. 2. Selective catheterization angiography of the inferior mesenteric artery, superior rectal artery, and anterior divisions of the bilateral internal iliac arteries 3. Particle embolization of the superior rectal artery. 4. Gel-Foam slurry embolization of the anterior division of the right internal iliac artery. MEDICATIONS: None. ANESTHESIA/SEDATION: Moderate (conscious) sedation was employed during this procedure. A total of Versed 2 mg and Fentanyl 100 mcg was administered intravenously. Moderate Sedation Time: 76 minutes. The patient's level of consciousness and vital signs were monitored continuously by radiology nursing throughout the procedure under my direct supervision. CONTRAST:  29mL OMNIPAQUE IOHEXOL 300 MG/ML SOLN, 45mL OMNIPAQUE IOHEXOL 300 MG/ML SOLN, 80mL OMNIPAQUE IOHEXOL 300 MG/ML SOLN FLUOROSCOPY TIME:  Fluoroscopy Time: 29 minutes 30 seconds (1,318 mGy). COMPLICATIONS: None immediate. PROCEDURE: Informed consent was obtained from the patient following explanation of the procedure, risks, benefits and alternatives. The patient understands, agrees and consents for the procedure. All questions were addressed. A time out was performed prior to the initiation of the procedure. Maximal barrier sterile technique utilized including caps, mask, sterile gowns, sterile gloves, large sterile drape, hand hygiene, and Betadine prep. The right groin was prepped and draped in standard fashion. Preprocedure ultrasound evaluation demonstrated patency of the right common femoral artery. A permanent image was captured and stored in the record. Subdermal Local anesthesia was provided at the planned needle entry site with 1% lidocaine. A small skin nick was made. Under direct  ultrasound visualization,  the right common femoral artery is accessed with standard micropuncture technique. A limited right lower extremity angiogram was performed which demonstrated adequate puncture site for closure device use. A J wire was directed to the abdominal aorta exchanged for a 5 French, 11 cm vascular sheath. A Mickelson catheter was then used to select the inferior mesenteric artery ostium. Inferior mesenteric angiogram was performed which demonstrated patency of the IMA. A Cook cantata microcatheter and a synchro soft microwire were then inserted into the proximal IMA and directed to the superior rectal artery. Angiogram of the superior rectal artery was performed which demonstrated perfusion of the bilateral rectum without any evidence of active extravasation. There was a small focal focus of hyperemia about the rectum which could represent etiology of hemorrhage. With the catheter parked at the bifurcation of the proximal superior rectal artery, particle embolization was performed using 500-700 micron embospheres. Near complete stasis was achieved. The microcatheter was removed and the Mickelson catheter was redirected to select the left common iliac artery. The catheter was exchanged over Glidewire for a C2 catheter. The C2 catheter was used to select the internal iliac artery. Internal iliac angiogram was performed which demonstrated patent anterior and posterior proximal branches and an inferior rectal artery arising from the proximal anterior division. Attempts at cannulating the inferior rectal artery were made with the microcatheter and microwire, however vessel spasm occurred in the inferior rectal artery was no longer visible. The microcatheter was removed and a Waltman loop was formed to redirect the C2 catheter into the right internal iliac artery. Angiogram of the right internal iliac artery demonstrated patent anterior posterior division with the inferior rectal artery arising from the  proximal aspect of the anterior division. The microcatheter and wire used to select the anterior division of the internal iliac artery. Position was confirmed with limited angiogram. A Gel-Foam slurry was made and embolization of the anterior division of the right internal iliac artery was performed. Upon completion of embolization, there is sluggish pulsatile antegrade flow with pruning of the distal branches including the inferior rectal artery. The microcatheter was removed and the C2 catheter was redirected to the common iliac artery and removed over a J wire. A 6 French Angio-Seal was then placed and deployed without complication or change in pulses. The patient tolerated the procedure well and remained stable throughout the procedure. The patient was transferred to the floor in stable condition. IMPRESSION: 1. Rectal hyperemia without evidence of active extravasation. 2. Prophylactic superior rectal artery particle embolization and Gelfoam embolization of the anterior division of the right common iliac artery Ruthann Cancer, MD [facility] Inter[facility] Radiology Special[facility] Radiology Electronically Signed   By: Ruthann Cancer M.D.   On: 11/04/2021 13:14   IR US Guide Vasc Access Right  Result Date: 11/02/2021 INDICATION: 65 year old male status post recent transrectal prostate biopsy presenting with hematochezia, anemia, and CT arteriogram evidence of acute hemorrhage from the rectum. EXAM: 1. Ultrasound-guided vascular access of the right common femoral artery. 2. Abdominal aortogram. 3. Selective catheterization and angiography of the inferior mesenteric artery, superior rectal artery, bilateral internal iliac arteries, and bilateral inferior rectal arteries 4. Gel-Foam embolization of the distal right inferior rectal artery MEDICATIONS: None. ANESTHESIA/SEDATION: Moderate (conscious) sedation was employed during this procedure. A total of Versed 0 mg and Fentanyl 0 mcg was administered  intravenously. Moderate Sedation Time: 0 minutes. The patient's level of consciousness and vital signs were monitored continuously by radiology nursing throughout the procedure under my direct supervision. CONTRAST:  50mL OMNIPAQUE IOHEXOL 300 MG/ML SOLN, 74mL OMNIPAQUE IOHEXOL 300 MG/ML SOLN, 27mL OMNIPAQUE IOHEXOL 300 MG/ML SOLN FLUOROSCOPY TIME:  Fluoroscopy Time: 31 minutes 12 seconds (2,778 mGy). COMPLICATIONS: None immediate. PROCEDURE: Informed consent was obtained from the patient following explanation of the procedure, risks, benefits and alternatives. The patient understands, agrees and consents for the procedure. All questions were addressed. A time out was performed prior to the initiation of the procedure. Maximal barrier sterile technique utilized including caps, mask, sterile gowns, sterile gloves, large sterile drape, hand hygiene, and Betadine prep. The right groin was prepped and draped in standard fashion. Preprocedure ultrasound evaluation demonstrated patency of the right common femoral artery. A permanent image was captured and stored in the record. Subdermal Local anesthesia was provided at the planned needle entry site with 1% lidocaine. A small skin nick was made. Under direct ultrasound visualization, the right common femoral artery was accessed with standard micropuncture technique. A limited right lower extremity angiogram was performed which demonstrated adequate puncture site for closure device use. A J wire was directed to the abdominal aorta and exchanged for a 5 French, 11 cm vascular sheath. A RIM catheter was introduced and was attempted to be used to engage the IMA however was unsuccessful. Abdominal aortogram was then performed in a steep obliquity with a pigtail flush catheter to demonstrate the ostium of the IMA. The pigtail catheter was then exchanged for a Mickelson catheter which successfully engaged the IMA ostium. A Cook cantata microcatheter and a synchro soft microwire  were then inserted into the proximal IMA. IMA angiogram was then performed which demonstrated patency of the superior rectal artery and its branches. There is mild hyperemia in the rectum without definite evidence of active extravasation. The microcatheter was removed and the Mickelson catheter was redirected to select the left common iliac artery. The catheter was exchanged over a Glidewire for a C2 catheter. The C2 catheter was uses likely internal iliac artery. Internal iliac angiogram was performed which demonstrated patent anterior and posterior proximal branches and in inferior rectal artery arising from the proximal anterior division. The microcatheter and wire were then used to select the inferior rectal artery proximally. Angiogram was performed which demonstrated no evidence of active extravasation. A Waltman loop was formed and the C2 catheter was used to select the right internal iliac artery. Angiogram of the right internal iliac artery demonstrated patent anterior posterior division within inferior rectal artery arising from the proximal aspect of the anterior division. The microcatheter and wire were used to select the inferior rectal artery. Angiogram demonstrated a possible focus of extravasation arising from a more superior branch of the distal inferior rectal artery. The distal branch was selected with the microcatheter. Additional angiogram was performed which demonstrated no evidence of active extravasation spilling into the rectum, however there was hyperemia and irregularity of the vessels in this region. Therefore, Gel-Foam embolization was performed using approximately 1 mL of Gel-Foam slurry. The catheter was retracted slightly and additional angiogram was performed which demonstrated complete embolization of the distal inferior rectal artery and its branches. No persistent hyperemia was observed. The microcatheter was removed. The C2 catheter was advanced to un formed a Waltman loop and  removed. A 6 French Angio-Seal was then placed and deployed without complication or change in pulses. The patient tolerated the procedure well and remained stable throughout the procedure. The patient was transferred to the floor in stable condition. IMPRESSION: 1. Possible focal extravasation versus hyperemia about a distal branch of the right  inferior rectal artery. 2. Successful Gel-Foam slurry embolization of the distal right inferior rectal artery. Ruthann Cancer, MD [facility] Radiology Electronically Signed   By: Ruthann Cancer M.D.   On: 11/02/2021 16:47   ECHOCARDIOGRAM COMPLETE  Result Date: 11/03/2021    ECHOCARDIOGRAM REPORT   Patient Name:   [name] Date of Exam: 11/03/2021 Medical Rec #:  [id number]       Height:       74.0 in Accession #:    [id number]      Weight:       250.0 lb Date of Birth:  10-29-1956       BSA:          2.390 m Patient Age:    60 years        BP:           118/73 mmHg Patient Gender: M               HR:           82 bpm. Exam Location:  Inpatient Procedure: 2D Echo, Color Doppler and Cardiac Doppler Indications:    Chest pain  History:        Patient has prior history of Echocardiogram examinations. Risk                 Factors:Hypertension, Diabetes and Sleep Apnea.  Sonographer:    Jyl Heinz Referring Phys: [id number] [place]  1. Left ventricular ejection fraction, by estimation, is 65 to 70%. The left ventricle has normal function. The left ventricle has no regional wall motion abnormalities. There is mild concentric left ventricular hypertrophy. Left ventricular diastolic parameters are consistent with Grade I diastolic dysfunction (impaired relaxation).  2. Right ventricular systolic function is normal. The right ventricular size is normal. Tricuspid regurgitation signal is inadequate for assessing PA pressure.  3. The mitral valve is grossly normal. No evidence of mitral valve regurgitation. No  evidence of mitral stenosis.  4. The aortic valve is tricuspid. Aortic valve regurgitation is not visualized. No aortic stenosis is present.  5. Aortic dilatation noted. There is mild dilatation of the aortic root, measuring 40 mm. Conclusion(s)/Recommendation(s): Normal biventricular function without evidence of hemodynamically significant valvular heart disease. FINDINGS  Left Ventricle: Left ventricular ejection fraction, by estimation, is 65 to 70%. The left ventricle has normal function. The left ventricle has no regional wall motion abnormalities. The left ventricular internal cavity size was normal in size. There is  mild concentric left ventricular hypertrophy. Left ventricular diastolic parameters are consistent with Grade I diastolic dysfunction (impaired relaxation). Right Ventricle: The right ventricular size is normal. No increase in right ventricular wall thickness. Right ventricular systolic function is normal. Tricuspid regurgitation signal is inadequate for assessing PA pressure. Left Atrium: Left atrial size was normal in size. Right Atrium: Right atrial size was normal in size. Pericardium: There is no evidence of pericardial effusion. Mitral Valve: The mitral valve is grossly normal. Mild mitral annular calcification. No evidence of mitral valve regurgitation. No evidence of mitral valve stenosis. Tricuspid Valve: The tricuspid valve is grossly normal. Tricuspid valve regurgitation is trivial. No evidence of tricuspid stenosis. Aortic Valve: The aortic valve is tricuspid. Aortic valve regurgitation is not visualized. No aortic stenosis is present. Aortic valve mean gradient measures 10.0 mmHg. Aortic valve peak gradient measures 17.5 mmHg. Aortic valve area, by VTI measures 4.49 cm. Pulmonic Valve: The pulmonic valve was grossly normal. Pulmonic valve regurgitation  is not visualized. No evidence of pulmonic stenosis. Aorta: Aortic dilatation noted. There is mild dilatation of the aortic root,  measuring 40 mm. Venous: The inferior vena cava was not well visualized. IAS/Shunts: The atrial septum is grossly normal.  LEFT VENTRICLE PLAX 2D LVIDd:         4.10 cm     Diastology LVIDs:         2.50 cm     LV e' medial:    8.55 cm/s LV PW:         1.30 cm     LV E/e' medial:  10.2 LV IVS:        1.30 cm     LV e' lateral:   8.38 cm/s LVOT diam:     2.40 cm     LV E/e' lateral: 10.4 LV SV:         159 LV SV Index:   67 LVOT Area:     4.52 cm  LV Volumes (MOD) LV vol d, MOD A2C: 97.2 ml LV vol d, MOD A4C: 92.8 ml LV vol s, MOD A2C: 32.5 ml LV vol s, MOD A4C: 33.3 ml LV SV MOD A2C:     64.7 ml LV SV MOD A4C:     92.8 ml LV SV MOD BP:      63.0 ml RIGHT VENTRICLE RV Basal diam:  2.90 cm RV Mid diam:    2.60 cm RV S prime:     12.60 cm/s TAPSE (M-mode): 2.5 cm LEFT ATRIUM             Index        RIGHT ATRIUM           Index LA diam:        3.20 cm 1.34 cm/m   RA Area:     18.70 cm LA Vol (A2C):   47.4 ml 19.84 ml/m  RA Volume:   54.50 ml  22.81 ml/m LA Vol (A4C):   36.8 ml 15.40 ml/m LA Biplane Vol: 43.5 ml 18.20 ml/m  AORTIC VALVE AV Area (Vmax):    3.96 cm AV Area (Vmean):   4.21 cm AV Area (VTI):     4.49 cm AV Vmax:           209.00 cm/s AV Vmean:          152.500 cm/s AV VTI:            0.355 m AV Peak Grad:      17.5 mmHg AV Mean Grad:      10.0 mmHg LVOT Vmax:         183.00 cm/s LVOT Vmean:        142.000 cm/s LVOT VTI:          0.352 m LVOT/AV VTI ratio: 0.99  AORTA Ao Root diam: 4.00 cm Ao Asc diam:  3.40 cm MITRAL VALVE MV Area (PHT): 3.36 cm     SHUNTS MV Decel Time: 226 msec     Systemic VTI:  0.35 m MV E velocity: 86.90 cm/s   Systemic Diam: 2.40 cm MV A velocity: 128.00 cm/s MV E/A ratio:  0.68 Eleonore Chiquito MD Electronically signed by Eleonore Chiquito MD Signature Date/Time: 11/03/2021/3:38:33 PM    Final    IR EMBO ART  VEN HEMORR LYMPH EXTRAV  INC GUIDE ROADMAPPING  Result Date: 11/04/2021 INDICATION: 65 year old male with rectal hemorrhage status post recent transrectal prostate  biopsy and status post right inferior rectal gelfoam  embolization on 11/02/21 with recurrent hemorrhage and profound anemia. He refuses blood transfusion as he is Jehovah's Witness. Currently hemodynamically stable. EXAM: 1. Ultrasound-guided vascular access of the right common femoral artery. 2. Selective catheterization angiography of the inferior mesenteric artery, superior rectal artery, and anterior divisions of the bilateral internal iliac arteries 3. Particle embolization of the superior rectal artery. 4. Gel-Foam slurry embolization of the anterior division of the right internal iliac artery. MEDICATIONS: None. ANESTHESIA/SEDATION: Moderate (conscious) sedation was employed during this procedure. A total of Versed 2 mg and Fentanyl 100 mcg was administered intravenously. Moderate Sedation Time: 76 minutes. The patient's level of consciousness and vital signs were monitored continuously by radiology nursing throughout the procedure under my direct supervision. CONTRAST:  42mL OMNIPAQUE IOHEXOL 300 MG/ML SOLN, 66mL OMNIPAQUE IOHEXOL 300 MG/ML SOLN, 27mL OMNIPAQUE IOHEXOL 300 MG/ML SOLN FLUOROSCOPY TIME:  Fluoroscopy Time: 29 minutes 30 seconds (1,318 mGy). COMPLICATIONS: None immediate. PROCEDURE: Informed consent was obtained from the patient following explanation of the procedure, risks, benefits and alternatives. The patient understands, agrees and consents for the procedure. All questions were addressed. A time out was performed prior to the initiation of the procedure. Maximal barrier sterile technique utilized including caps, mask, sterile gowns, sterile gloves, large sterile drape, hand hygiene, and Betadine prep. The right groin was prepped and draped in standard fashion. Preprocedure ultrasound evaluation demonstrated patency of the right common femoral artery. A permanent image was captured and stored in the record. Subdermal Local anesthesia was provided at the planned needle entry site with 1%  lidocaine. A small skin nick was made. Under direct ultrasound visualization, the right common femoral artery is accessed with standard micropuncture technique. A limited right lower extremity angiogram was performed which demonstrated adequate puncture site for closure device use. A J wire was directed to the abdominal aorta exchanged for a 5 French, 11 cm vascular sheath. A Mickelson catheter was then used to select the inferior mesenteric artery ostium. Inferior mesenteric angiogram was performed which demonstrated patency of the IMA. A Cook cantata microcatheter and a synchro soft microwire were then inserted into the proximal IMA and directed to the superior rectal artery. Angiogram of the superior rectal artery was performed which demonstrated perfusion of the bilateral rectum without any evidence of active extravasation. There was a small focal focus of hyperemia about the rectum which could represent etiology of hemorrhage. With the catheter parked at the bifurcation of the proximal superior rectal artery, particle embolization was performed using 500-700 micron embospheres. Near complete stasis was achieved. The microcatheter was removed and the Mickelson catheter was redirected to select the left common iliac artery. The catheter was exchanged over Glidewire for a C2 catheter. The C2 catheter was used to select the internal iliac artery. Internal iliac angiogram was performed which demonstrated patent anterior and posterior proximal branches and an inferior rectal artery arising from the proximal anterior division. Attempts at cannulating the inferior rectal artery were made with the microcatheter and microwire, however vessel spasm occurred in the inferior rectal artery was no longer visible. The microcatheter was removed and a Waltman loop was formed to redirect the C2 catheter into the right internal iliac artery. Angiogram of the right internal iliac artery demonstrated patent anterior posterior  division with the inferior rectal artery arising from the proximal aspect of the anterior division. The microcatheter and wire used to select the anterior division of the internal iliac artery. Position was confirmed with limited angiogram. A Gel-Foam slurry was made and embolization of  the anterior division of the right internal iliac artery was performed. Upon completion of embolization, there is sluggish pulsatile antegrade flow with pruning of the distal branches including the inferior rectal artery. The microcatheter was removed and the C2 catheter was redirected to the common iliac artery and removed over a J wire. A 6 French Angio-Seal was then placed and deployed without complication or change in pulses. The patient tolerated the procedure well and remained stable throughout the procedure. The patient was transferred to the floor in stable condition. IMPRESSION: 1. Rectal hyperemia without evidence of active extravasation. 2. Prophylactic superior rectal artery particle embolization and Gelfoam embolization of the anterior division of the right common iliac artery Ruthann Cancer, MD [facility] Radiology Electronically Signed   By: Ruthann Cancer M.D.   On: 11/04/2021 13:14   IR EMBO ART  VEN HEMORR LYMPH EXTRAV  INC GUIDE ROADMAPPING  Result Date: 11/04/2021 INDICATION: 65 year old male with rectal hemorrhage status post recent transrectal prostate biopsy and status post right inferior rectal gelfoam embolization on 11/02/21 with recurrent hemorrhage and profound anemia. He refuses blood transfusion as he is Jehovah's Witness. Currently hemodynamically stable. EXAM: 1. Ultrasound-guided vascular access of the right common femoral artery. 2. Selective catheterization angiography of the inferior mesenteric artery, superior rectal artery, and anterior divisions of the bilateral internal iliac arteries 3. Particle embolization of the superior rectal artery. 4.  Gel-Foam slurry embolization of the anterior division of the right internal iliac artery. MEDICATIONS: None. ANESTHESIA/SEDATION: Moderate (conscious) sedation was employed during this procedure. A total of Versed 2 mg and Fentanyl 100 mcg was administered intravenously. Moderate Sedation Time: 76 minutes. The patient's level of consciousness and vital signs were monitored continuously by radiology nursing throughout the procedure under my direct supervision. CONTRAST:  4mL OMNIPAQUE IOHEXOL 300 MG/ML SOLN, 14mL OMNIPAQUE IOHEXOL 300 MG/ML SOLN, 78mL OMNIPAQUE IOHEXOL 300 MG/ML SOLN FLUOROSCOPY TIME:  Fluoroscopy Time: 29 minutes 30 seconds (1,318 mGy). COMPLICATIONS: None immediate. PROCEDURE: Informed consent was obtained from the patient following explanation of the procedure, risks, benefits and alternatives. The patient understands, agrees and consents for the procedure. All questions were addressed. A time out was performed prior to the initiation of the procedure. Maximal barrier sterile technique utilized including caps, mask, sterile gowns, sterile gloves, large sterile drape, hand hygiene, and Betadine prep. The right groin was prepped and draped in standard fashion. Preprocedure ultrasound evaluation demonstrated patency of the right common femoral artery. A permanent image was captured and stored in the record. Subdermal Local anesthesia was provided at the planned needle entry site with 1% lidocaine. A small skin nick was made. Under direct ultrasound visualization, the right common femoral artery is accessed with standard micropuncture technique. A limited right lower extremity angiogram was performed which demonstrated adequate puncture site for closure device use. A J wire was directed to the abdominal aorta exchanged for a 5 French, 11 cm vascular sheath. A Mickelson catheter was then used to select the inferior mesenteric artery ostium. Inferior mesenteric angiogram was performed which  demonstrated patency of the IMA. A Cook cantata microcatheter and a synchro soft microwire were then inserted into the proximal IMA and directed to the superior rectal artery. Angiogram of the superior rectal artery was performed which demonstrated perfusion of the bilateral rectum without any evidence of active extravasation. There was a small focal focus of hyperemia about the rectum which could represent etiology of hemorrhage. With the catheter parked at the bifurcation of the  proximal superior rectal artery, particle embolization was performed using 500-700 micron embospheres. Near complete stasis was achieved. The microcatheter was removed and the Mickelson catheter was redirected to select the left common iliac artery. The catheter was exchanged over Glidewire for a C2 catheter. The C2 catheter was used to select the internal iliac artery. Internal iliac angiogram was performed which demonstrated patent anterior and posterior proximal branches and an inferior rectal artery arising from the proximal anterior division. Attempts at cannulating the inferior rectal artery were made with the microcatheter and microwire, however vessel spasm occurred in the inferior rectal artery was no longer visible. The microcatheter was removed and a Waltman loop was formed to redirect the C2 catheter into the right internal iliac artery. Angiogram of the right internal iliac artery demonstrated patent anterior posterior division with the inferior rectal artery arising from the proximal aspect of the anterior division. The microcatheter and wire used to select the anterior division of the internal iliac artery. Position was confirmed with limited angiogram. A Gel-Foam slurry was made and embolization of the anterior division of the right internal iliac artery was performed. Upon completion of embolization, there is sluggish pulsatile antegrade flow with pruning of the distal branches including the inferior rectal artery. The  microcatheter was removed and the C2 catheter was redirected to the common iliac artery and removed over a J wire. A 6 French Angio-Seal was then placed and deployed without complication or change in pulses. The patient tolerated the procedure well and remained stable throughout the procedure. The patient was transferred to the floor in stable condition. IMPRESSION: 1. Rectal hyperemia without evidence of active extravasation. 2. Prophylactic superior rectal artery particle embolization and Gelfoam embolization of the anterior division of the right common iliac artery Ruthann Cancer, MD [facility] Radiology Electronically Signed   By: Ruthann Cancer M.D.   On: 11/04/2021 13:14   IR EMBO ART  VEN HEMORR LYMPH EXTRAV  INC GUIDE ROADMAPPING  Result Date: 11/02/2021 INDICATION: 65 year old male status post recent transrectal prostate biopsy presenting with hematochezia, anemia, and CT arteriogram evidence of acute hemorrhage from the rectum. EXAM: 1. Ultrasound-guided vascular access of the right common femoral artery. 2. Abdominal aortogram. 3. Selective catheterization and angiography of the inferior mesenteric artery, superior rectal artery, bilateral internal iliac arteries, and bilateral inferior rectal arteries 4. Gel-Foam embolization of the distal right inferior rectal artery MEDICATIONS: None. ANESTHESIA/SEDATION: Moderate (conscious) sedation was employed during this procedure. A total of Versed 0 mg and Fentanyl 0 mcg was administered intravenously. Moderate Sedation Time: 0 minutes. The patient's level of consciousness and vital signs were monitored continuously by radiology nursing throughout the procedure under my direct supervision. CONTRAST:  66mL OMNIPAQUE IOHEXOL 300 MG/ML SOLN, 75mL OMNIPAQUE IOHEXOL 300 MG/ML SOLN, 49mL OMNIPAQUE IOHEXOL 300 MG/ML SOLN FLUOROSCOPY TIME:  Fluoroscopy Time: 31 minutes 12 seconds (2,778 mGy). COMPLICATIONS: None  immediate. PROCEDURE: Informed consent was obtained from the patient following explanation of the procedure, risks, benefits and alternatives. The patient understands, agrees and consents for the procedure. All questions were addressed. A time out was performed prior to the initiation of the procedure. Maximal barrier sterile technique utilized including caps, mask, sterile gowns, sterile gloves, large sterile drape, hand hygiene, and Betadine prep. The right groin was prepped and draped in standard fashion. Preprocedure ultrasound evaluation demonstrated patency of the right common femoral artery. A permanent image was captured and stored in the record. Subdermal Local anesthesia was provided at the planned needle  entry site with 1% lidocaine. A small skin nick was made. Under direct ultrasound visualization, the right common femoral artery was accessed with standard micropuncture technique. A limited right lower extremity angiogram was performed which demonstrated adequate puncture site for closure device use. A J wire was directed to the abdominal aorta and exchanged for a 5 French, 11 cm vascular sheath. A RIM catheter was introduced and was attempted to be used to engage the IMA however was unsuccessful. Abdominal aortogram was then performed in a steep obliquity with a pigtail flush catheter to demonstrate the ostium of the IMA. The pigtail catheter was then exchanged for a Mickelson catheter which successfully engaged the IMA ostium. A Cook cantata microcatheter and a synchro soft microwire were then inserted into the proximal IMA. IMA angiogram was then performed which demonstrated patency of the superior rectal artery and its branches. There is mild hyperemia in the rectum without definite evidence of active extravasation. The microcatheter was removed and the Mickelson catheter was redirected to select the left common iliac artery. The catheter was exchanged over a Glidewire for a C2 catheter. The C2  catheter was uses likely internal iliac artery. Internal iliac angiogram was performed which demonstrated patent anterior and posterior proximal branches and in inferior rectal artery arising from the proximal anterior division. The microcatheter and wire were then used to select the inferior rectal artery proximally. Angiogram was performed which demonstrated no evidence of active extravasation. A Waltman loop was formed and the C2 catheter was used to select the right internal iliac artery. Angiogram of the right internal iliac artery demonstrated patent anterior posterior division within inferior rectal artery arising from the proximal aspect of the anterior division. The microcatheter and wire were used to select the inferior rectal artery. Angiogram demonstrated a possible focus of extravasation arising from a more superior branch of the distal inferior rectal artery. The distal branch was selected with the microcatheter. Additional angiogram was performed which demonstrated no evidence of active extravasation spilling into the rectum, however there was hyperemia and irregularity of the vessels in this region. Therefore, Gel-Foam embolization was performed using approximately 1 mL of Gel-Foam slurry. The catheter was retracted slightly and additional angiogram was performed which demonstrated complete embolization of the distal inferior rectal artery and its branches. No persistent hyperemia was observed. The microcatheter was removed. The C2 catheter was advanced to un formed a Waltman loop and removed. A 6 French Angio-Seal was then placed and deployed without complication or change in pulses. The patient tolerated the procedure well and remained stable throughout the procedure. The patient was transferred to the floor in stable condition. IMPRESSION: 1. Possible focal extravasation versus hyperemia about a distal branch of the right inferior rectal artery. 2. Successful Gel-Foam slurry embolization of the  distal right inferior rectal artery. Ruthann Cancer, MD [facility] [address] [facility] Radiology Electronically Signed   By: Ruthann Cancer M.D.   On: 11/02/2021 16:47   CT Angio Abd/Pel w/ and/or w/o  Result Date: 11/02/2021 CLINICAL DATA:  Acute lower GI bleeding, status post prostate biopsy last week. EXAM: CT ANGIOGRAPHY ABDOMEN AND PELVIS WITH CONTRAST AND WITHOUT CONTRAST TECHNIQUE: Multidetector CT imaging of the abdomen and pelvis was performed using the standard protocol during bolus administration of intravenous contrast. Multiplanar reconstructed images and MIPs were obtained and reviewed to evaluate the vascular anatomy. CONTRAST:  119mL OMNIPAQUE IOHEXOL 350 MG/ML SOLN COMPARISON:  10/08/2020 FINDINGS: VASCULAR Aorta: Minor aortic atherosclerosis. Negative for aneurysm, dissection or occlusive process.  No retroperitoneal hemorrhage or hematoma. Celiac: Minor atherosclerotic origin but remains patent including its branches SMA: Minor atherosclerotic origin but remains patent including its branches Renals: Main renal arteries remain patent. Minor atherosclerotic changes. No accessory renal artery. IMA: Remains patent off the distal aorta anteriorly including its branches. Lower rectum active contrast extravasation on the arterial phase which progresses on the delayed portal venous phase imaging compatible with acute active lower rectal GI bleeding. Inflow: Iliac atherosclerosis noted but no inflow disease or occlusion. Mild iliac tortuosity. Common, internal and external iliac arteries all remain patent. Proximal Outflow: Common femoral, proximal profunda femoral, proximal superficial femoral arteries all remain patent. Veins: No veno-occlusive process. Review of the MIP images confirms the above findings. NON-VASCULAR Lower chest: No acute abnormality. Hepatobiliary: No focal hepatic abnormality. Small left hepatic lobe noted. No biliary obstruction pattern dilatation.  Patent portal and hepatic veins. Small gallstones noted. Common bile duct nondilated. Pancreas: Unremarkable. No pancreatic ductal dilatation or surrounding inflammatory changes. Spleen: Normal in size without focal abnormality. Adrenals/Urinary Tract: Adrenal glands are unremarkable. Kidneys are normal, without renal calculi, focal lesion, or hydronephrosis. Bladder is unremarkable. Stomach/Bowel: Postop changes from gastric bypass. Negative for bowel obstruction, significant dilatation, ileus, or free air. Appendix not visualized. No free fluid, fluid collection, abscess or ascites. Lymphatic: No bulky adenopathy. Reproductive: No other significant finding by CT. Other: No abdominal wall hernia or abnormality. No abdominopelvic ascites. Musculoskeletal: Degenerative changes throughout the spine. Multilevel lower lumbar facet arthropathy. No acute osseous finding. IMPRESSION: VASCULAR Positive CTA for acute lower rectal GI bleeding. These results were called by telephone at the time of interpretation on 11/02/2021 at 11:16 am to provider AMY ESTERWOOD , who verbally acknowledged these results. NON-VASCULAR Remote gastric bypass surgery Incidental cholelithiasis Electronically Signed   By: Jerilynn Mages.  Shick M.D.   On: 11/02/2021 11:21      Subjective: No new complaints.  Discharge Exam: Vitals:   11/07/21 1541 11/08/21 0446  BP: 103/62 105/61  Pulse: 63 63  Resp: 20 18  Temp: 98.5 F (36.9 C) 98.5 F (36.9 C)  SpO2: 99% 96%   Vitals:   11/06/21 2221 11/07/21 0622 11/07/21 1541 11/08/21 0446  BP: 110/65 112/68 103/62 105/61  Pulse: 72 61 63 63  Resp: 18 20 20 18   Temp: 98.4 F (36.9 C) 98.5 F (36.9 C) 98.5 F (36.9 C) 98.5 F (36.9 C)  TempSrc: Oral Oral Oral Oral  SpO2: 99% 100% 99% 96%  Weight:      Height:        General: Pt is alert, awake, not in acute distress Cardiovascular: RRR, S1/S2 +, no rubs, no gallops Respiratory: CTA bilaterally, no wheezing, no rhonchi Abdominal: Soft,  NT, ND, bowel sounds + Extremities: no edema, no cyanosis    The results of significant diagnostics from this hospitalization (including imaging, microbiology, ancillary and laboratory) are listed below for reference.     Microbiology: Recent Results (from the past 240 hour(s))  Resp Panel by RT-PCR (Flu A&B, Covid) Nasopharyngeal Swab     Status: None   Collection Time: 11/02/21  7:06 AM   Specimen: Nasopharyngeal Swab; Nasopharyngeal(NP) swabs in vial transport medium  Result Value Ref Range Status   SARS Coronavirus 2 by RT PCR NEGATIVE NEGATIVE Final    Comment: (NOTE) SARS-CoV-2 target nucleic acids are NOT DETECTED.  The SARS-CoV-2 RNA is generally detectable in upper respiratory specimens during the acute phase of infection. The lowest concentration of SARS-CoV-2 viral copies this assay can detect is 138  copies/mL. A negative result does not preclude SARS-Cov-2 infection and should not be used as the sole basis for treatment or other patient management decisions. A negative result may occur with  improper specimen collection/handling, submission of specimen other than nasopharyngeal swab, presence of viral mutation(s) within the areas targeted by this assay, and inadequate number of viral copies(<138 copies/mL). A negative result must be combined with clinical observations, patient history, and epidemiological information. The expected result is Negative.  Fact Sheet for Patients:  EntrepreneurPulse.com.au  Fact Sheet for Healthcare Providers:  IncredibleEmployment.be  This test is no t yet approved or cleared by the [place] FDA and  has been authorized for detection and/or diagnosis of SARS-CoV-2 by FDA under an Emergency Use Authorization (EUA). This EUA will remain  in effect (meaning this test can be used) for the duration of the COVID-19 declaration under Section 564(b)(1) of the Act, 21 U.S.C.section 360bbb-3(b)(1),  unless the authorization is terminated  or revoked sooner.       Influenza A by PCR NEGATIVE NEGATIVE Final   Influenza B by PCR NEGATIVE NEGATIVE Final    Comment: (NOTE) The Xpert Xpress SARS-CoV-2/FLU/RSV plus assay is intended as an aid in the diagnosis of influenza from Nasopharyngeal swab specimens and should not be used as a sole basis for treatment. Nasal washings and aspirates are unacceptable for Xpert Xpress SARS-CoV-2/FLU/RSV testing.  Fact Sheet for Patients: EntrepreneurPulse.com.au  Fact Sheet for Healthcare Providers: IncredibleEmployment.be  This test is not yet approved or cleared by the [place] FDA and has been authorized for detection and/or diagnosis of SARS-CoV-2 by FDA under an Emergency Use Authorization (EUA). This EUA will remain in effect (meaning this test can be used) for the duration of the COVID-19 declaration under Section 564(b)(1) of the Act, 21 U.S.C. section 360bbb-3(b)(1), unless the authorization is terminated or revoked.  Performed at [facility], [place] [address] [place], [place] [address]   MRSA Next Gen by PCR, Nasal     Status: None   Collection Time: 11/02/21 12:53 PM   Specimen: Nasal Mucosa; Nasal Swab  Result Value Ref Range Status   MRSA by PCR Next Gen NOT DETECTED NOT DETECTED Final    Comment: (NOTE) The GeneXpert MRSA Assay (FDA approved for NASAL specimens only), is one component of a comprehensive MRSA colonization surveillance program. It is not intended to diagnose MRSA infection nor to guide or monitor treatment for MRSA infections. Test performance is not FDA approved in patients less than 46 years old. Performed at [facility], [place] [address] [place], [place] [address]      Labs: BNP (last 3 results) No results for input(s): BNP in the last 8760 hours. Basic Metabolic Panel: Recent Labs  Lab 11/02/21 0340 11/03/21 0455  11/04/21 0501 11/06/21 0528  NA 139 134* 135 134*  K 4.9 4.5 4.0 3.6  CL 105 107 110 105  CO2 25 21* 24 26  GLUCOSE 101* 168* 117* 129*  BUN 31* 36* 25* 10  CREATININE 1.29* 1.67* 1.26* 1.19  CALCIUM 8.9 8.1* 7.6* 8.3*   Liver Function Tests: Recent Labs  Lab 11/02/21 0340 11/03/21 0455  AST 43* 28  ALT 34 27  ALKPHOS 48 37*  BILITOT 0.5 0.5  PROT 6.8 4.9*  ALBUMIN 4.0 3.1*   No results for input(s): LIPASE, AMYLASE in the last 168 hours. No results for input(s): AMMONIA in the last 168 hours. CBC: Recent Labs  Lab 11/02/21 0340 11/02/21 1224 11/03/21 0455  11/03/21 1142 11/04/21 1843 11/05/21 0050 11/05/21 1055 11/06/21 0528 11/07/21 1005  WBC 6.0  --  7.8  --   --   --   --  7.3  --   NEUTROABS 2.9  --   --   --   --   --   --   --   --   HGB 11.5*   < > 6.8*   < > 4.9* 6.7* 5.2* 5.1* 5.7*  HCT 37.7*   < > 22.2*   < > 15.5* 21.6* 16.5* 16.3* 18.8*  MCV 72.2*  --  73.3*  --   --   --   --  72.4*  --   PLT 191  --  146*  --   --   --   --  148*  --    < > = values in this interval not displayed.   Cardiac Enzymes: No results for input(s): CKTOTAL, CKMB, CKMBINDEX, TROPONINI in the last 168 hours. BNP: Invalid input(s): POCBNP CBG: Recent Labs  Lab 11/07/21 0548 11/07/21 1205 11/07/21 1750 11/07/21 2216 11/08/21 0444  GLUCAP 123* 135* 122* 172* 113*   D-Dimer No results for input(s): DDIMER in the last 72 hours. Hgb A1c No results for input(s): HGBA1C in the last 72 hours. Lipid Profile No results for input(s): CHOL, HDL, LDLCALC, TRIG, CHOLHDL, LDLDIRECT in the last 72 hours. Thyroid function studies No results for input(s): TSH, T4TOTAL, T3FREE, THYROIDAB in the last 72 hours.  Invalid input(s): FREET3 Anemia work up No results for input(s): VITAMINB12, FOLATE, FERRITIN, TIBC, IRON, RETICCTPCT in the last 72 hours. Urinalysis    Component Value Date/Time   COLORURINE YELLOW 11/02/2021 0615   APPEARANCEUR CLOUDY (A) 11/02/2021 0615   LABSPEC  1.018 11/02/2021 0615   PHURINE 5.0 11/02/2021 0615   GLUCOSEU NEGATIVE 11/02/2021 0615   GLUCOSEU NEGATIVE 03/10/2021 0900   HGBUR LARGE (A) 11/02/2021 0615   BILIRUBINUR NEGATIVE 11/02/2021 0615   BILIRUBINUR negative 09/11/2013 1815   KETONESUR NEGATIVE 11/02/2021 0615   PROTEINUR 30 (A) 11/02/2021 0615   UROBILINOGEN 0.2 03/10/2021 0900   NITRITE NEGATIVE 11/02/2021 0615   LEUKOCYTESUR NEGATIVE 11/02/2021 0615   Sepsis Labs Invalid input(s): PROCALCITONIN,  WBC,  LACTICIDVEN Microbiology Recent Results (from the past 240 hour(s))  Resp Panel by RT-PCR (Flu A&B, Covid) Nasopharyngeal Swab     Status: None   Collection Time: 11/02/21  7:06 AM   Specimen: Nasopharyngeal Swab; Nasopharyngeal(NP) swabs in vial transport medium  Result Value Ref Range Status   SARS Coronavirus 2 by RT PCR NEGATIVE NEGATIVE Final    Comment: (NOTE) SARS-CoV-2 target nucleic acids are NOT DETECTED.  The SARS-CoV-2 RNA is generally detectable in upper respiratory specimens during the acute phase of infection. The lowest concentration of SARS-CoV-2 viral copies this assay can detect is 138 copies/mL. A negative result does not preclude SARS-Cov-2 infection and should not be used as the sole basis for treatment or other patient management decisions. A negative result may occur with  improper specimen collection/handling, submission of specimen other than nasopharyngeal swab, presence of viral mutation(s) within the areas targeted by this assay, and inadequate number of viral copies(<138 copies/mL). A negative result must be combined with clinical observations, patient history, and epidemiological information. The expected result is Negative.  Fact Sheet for Patients:  EntrepreneurPulse.com.au  Fact Sheet for Healthcare Providers:  IncredibleEmployment.be  This test is no t yet approved or cleared by the [place] FDA and  has been authorized for detection  and/or diagnosis of SARS-CoV-2 by FDA under an Emergency Use Authorization (EUA). This EUA will remain  in effect (meaning this test can be used) for the duration of the COVID-19 declaration under Section 564(b)(1) of the Act, 21 U.S.C.section 360bbb-3(b)(1), unless the authorization is terminated  or revoked sooner.       Influenza A by PCR NEGATIVE NEGATIVE Final   Influenza B by PCR NEGATIVE NEGATIVE Final    Comment: (NOTE) The Xpert Xpress SARS-CoV-2/FLU/RSV plus assay is intended as an aid in the diagnosis of influenza from Nasopharyngeal swab specimens and should not be used as a sole basis for treatment. Nasal washings and aspirates are unacceptable for Xpert Xpress SARS-CoV-2/FLU/RSV testing.  Fact Sheet for Patients: EntrepreneurPulse.com.au  Fact Sheet for Healthcare Providers: IncredibleEmployment.be  This test is not yet approved or cleared by the [place] FDA and has been authorized for detection and/or diagnosis of SARS-CoV-2 by FDA under an Emergency Use Authorization (EUA). This EUA will remain in effect (meaning this test can be used) for the duration of the COVID-19 declaration under Section 564(b)(1) of the Act, 21 U.S.C. section 360bbb-3(b)(1), unless the authorization is terminated or revoked.  Performed at [facility], [place] [address] [place], [place] [address]   MRSA Next Gen by PCR, Nasal     Status: None   Collection Time: 11/02/21 12:53 PM   Specimen: Nasal Mucosa; Nasal Swab  Result Value Ref Range Status   MRSA by PCR Next Gen NOT DETECTED NOT DETECTED Final    Comment: (NOTE) The GeneXpert MRSA Assay (FDA approved for NASAL specimens only), is one component of a comprehensive MRSA colonization surveillance program. It is not intended to diagnose MRSA infection nor to guide or monitor treatment for MRSA infections. Test performance is not FDA approved in patients less than 70  years old. Performed at [facility], [place] [address] [place], [place] [address]      Time coordinating discharge: 42 minutes.  SIGNED:   Hosie Poisson, MD  [facility] 11/08/2021, 11:21 AM

## 2021-11-08 NOTE — Progress Notes (Signed)
Upper extremity venous LT study completed.  Preliminary results relayed to Karleen Hampshire, MD.  See CV Proc for preliminary results report.   Darlin Coco, RDMS, RVT

## 2021-11-08 NOTE — Progress Notes (Signed)
Discharge paperwork given to and reviewed with patient and patient's family member.  PIV and telemetry removed.  Patient was escorted to main entrance with belongings via wheelchair for transport home with family member.  Angie Fava, RN

## 2021-11-09 ENCOUNTER — Telehealth: Payer: Self-pay | Admitting: Internal Medicine

## 2021-11-09 NOTE — Telephone Encounter (Signed)
Patient was discharged from hospital on 12-4, patient states he was told by hospital md to take folic acid  Patient requesting a call back to discuss if a rx can be written for folic acid or alternative otc   Patient has an ov scheduled for 12-9

## 2021-11-09 NOTE — Telephone Encounter (Signed)
Ok to address at the [facility]

## 2021-11-10 ENCOUNTER — Telehealth: Payer: Self-pay

## 2021-11-10 NOTE — Telephone Encounter (Signed)
Transition Care Management Follow-up Telephone Call Date of discharge and from where: 11/08/2021 from [facility] Diagnosis: Hematochezia, GIB How have you been since you were released from the hospital? "Doing pretty good" Any questions or concerns? No  Items Reviewed: Did the pt receive and understand the discharge instructions provided? Yes  Medications obtained and verified? No , needs a rx for folic acid per patient Other? No  Any new allergies since your discharge? No  Dietary orders reviewed? Yes, low sodium heart healthy diet Do you have support at home? Yes , wife  Home Care and Equipment/Supplies: Were home health services ordered? no If so, what is the name of the agency? N/a  Has the agency set up a time to come to the patient's home? no Were any new equipment or medical supplies ordered?  No What is the name of the medical supply agency? no Were you able to get the supplies/equipment? not applicable Do you have any questions related to the use of the equipment or supplies? No  Functional Questionnaire: (I = Independent and D = Dependent) ADLs: I  Bathing/Dressing- I  Meal Prep- I  Eating- I  Maintaining continence- I  Transferring/Ambulation- I  Managing Meds- I  Follow up appointments reviewed:  PCP Hospital f/u appt confirmed? Yes  Scheduled to see Cathlean Cower, MD on 11/13/2021 @ 9:40 am. [facility] f/u appt confirmed? Yes  Scheduled to see Dr. Grant Ruts on 11/11/2021 @ 8:30 am. Are transportation arrangements needed? No  If their condition worsens, is the pt aware to call PCP or go to the Emergency Dept.? Yes Was the patient provided with contact information for the PCP's office or ED? Yes Was to pt encouraged to call back with questions or concerns? Yes

## 2021-11-10 NOTE — Progress Notes (Signed)
[place]   [phone] [phone]   Clinic Follow up Note   Patient Care Team: Biagio Borg, MD as PCP - General Meda Coffee Jamse Belfast, MD (Inactive) as PCP - Cardiology (Cardiology) Suella Broad, MD as Consulting Physician (Physical Medicine and Rehabilitation) Eldridge Abrahams, MD as Referring Physician (Surgical Oncology) Renato Shin, MD as Consulting Physician (Endocrinology) Calvert Cantor, MD as Consulting Physician (Ophthalmology) 11/11/2021  CHIEF COMPLAINT: hospital f/up anemia secondary to rectal bleed after prostate biopsy  CURRENT THERAPY: Patient does not except blood products due to being Jehovah's Witness, supportive care with IV iron PRN, and oral A25 and folic acid.  Has not started oral iron at this time  INTERVAL HISTORY: [name] presents for first outpatient visit as scheduled.  Initially seen by Dr. Annamaria Boots inpatient 11/03/2021 while admitted for rectal bleeding and acute anemia following prostate biopsy.  Hemoglobin 6.8 but dropped as low as 4.9, lab also showed iron deficiency.  He underwent IR embolization of the rectal artery.  He was given IV ferlicit x2, IV Feraheme x1, vitamin K x1, and Aranesp 200 mcg x1 in the hospital and started on oral K53 and folic acid.  Discharge hemoglobin on 11/07/2021 of 5.7.  Given that his previous PE was provoked by orthopedic surgery and he completed over 15 years on Coumadin, this was discontinued while he was in the hospital.  He has not restarted aspirin.  Today he presents with his wife.  He feels well overall but dizzy with activity and nagging left headache since hospitalization.  He is eating and drinking and remains mobile at home.  No signs of recurrent thrombosis/PE.  Denies recurrent rectal bleeding or black stool.  He had mild constipation that resolved with diet.  He had low left hip/buttock pain that resolved on gabapentin.  Denies other bone pain, abdominal pain or bloating, fever,  chills, cough, chest pain, dyspnea.  He saw Dr. Louis Meckel, prostate cancer treatment is on hold for anemia.    MEDICAL HISTORY:  Past Medical History:  Diagnosis Date   Arthritis    BACK PAIN    CAD (coronary artery disease) 06/28/2017   CAD in native artery    a. moderate by cardiac CT 2016.   Chronic bilateral low back pain with bilateral sciatica 01/12/2019   Degenerative arthritis of right knee 07/15/2016   Injected in 07/15/2016 Orthovisc injection started 10/07/2016 DepoMedrol 40 inj on 04/11/17, 25 cc aspirated Orthovisc injection in 05/30/2017     DEGENERATIVE JOINT DISEASE, KNEES, BILATERAL    Depression    Diabetes ([place]) 10/01/2014   Diabetes mellitus    type II   DIVERTICULOSIS, COLON    DVT, lower extremity ([place])    right   History of kidney stones    Hyperlipidemia    Hypertension    HYPOGONADISM, MALE    HYPOTENSION, ORTHOSTATIC    Kidney stones    Long term (current) use of anticoagulants 08/17/2017   Long term current use of anticoagulant    Lumbosacral radiculopathy at S1 06/14/2012   Maxillary sinus cyst 12/11/2018   Obesity    OBESITY, MORBID 03/04/2008   Qualifier: Diagnosis of  By: Jenny Reichmann MD, Hunt Oris    OSA (obstructive sleep apnea) 09/11/2013   Pulmonary emboli (HCC)    Pulmonary embolism ([place]) 02/05/2011   RBBB 07/28/2017   Renal insufficiency    a. prior h/o, does not appear chronic.   Superficial phlebitis of arm 03/18/2016   Vitamin D deficiency 07/09/2020  SURGICAL HISTORY: Past Surgical History:  Procedure Laterality Date   ankle surgury  2001 bilateral   with bone spurs and torn tendon   COLONOSCOPY  03/18/2010   EDG  2008   chronic Duodenitis   FLEXIBLE SIGMOIDOSCOPY N/A 11/03/2021   Procedure: FLEXIBLE SIGMOIDOSCOPY;  Surgeon: Doran Stabler, MD;  Location: [facility];  Service: Gastroenterology;  Laterality: N/A;   GASTRIC BYPASS     HEEL SPUR SURGERY Bilateral 2005   IR ANGIOGRAM PELVIS SELECTIVE OR SUPRASELECTIVE  11/02/2021   IR  ANGIOGRAM PELVIS SELECTIVE OR SUPRASELECTIVE  11/04/2021   IR ANGIOGRAM SELECTIVE EACH ADDITIONAL VESSEL  11/02/2021   IR ANGIOGRAM SELECTIVE EACH ADDITIONAL VESSEL  11/02/2021   IR ANGIOGRAM SELECTIVE EACH ADDITIONAL VESSEL  11/02/2021   IR ANGIOGRAM SELECTIVE EACH ADDITIONAL VESSEL  11/04/2021   IR ANGIOGRAM SELECTIVE EACH ADDITIONAL VESSEL  11/04/2021   IR ANGIOGRAM SELECTIVE EACH ADDITIONAL VESSEL  11/04/2021   IR ANGIOGRAM SELECTIVE EACH ADDITIONAL VESSEL  11/04/2021   IR ANGIOGRAM VISCERAL SELECTIVE  11/02/2021   IR ANGIOGRAM VISCERAL SELECTIVE  11/04/2021   IR EMBO ART  VEN HEMORR LYMPH EXTRAV  INC GUIDE ROADMAPPING  11/02/2021   IR EMBO ART  VEN HEMORR LYMPH EXTRAV  INC GUIDE ROADMAPPING  11/04/2021   IR EMBO ART  VEN HEMORR LYMPH EXTRAV  INC GUIDE ROADMAPPING  11/04/2021   IR US GUIDE VASC ACCESS RIGHT  11/02/2021   IR US GUIDE [place] RIGHT  11/04/2021   knee surgury  2000 & 2003    cartilage damage   LEFT HEART CATH AND CORONARY ANGIOGRAPHY N/A 02/16/2021   Procedure: LEFT HEART CATH AND CORONARY ANGIOGRAPHY;  Surgeon: Sherren Mocha, MD;  Location: [facility];  Service: Cardiovascular;  Laterality: N/A;   LUMBAR LAMINECTOMY/DECOMPRESSION MICRODISCECTOMY  09/28/2012   Procedure: LUMBAR LAMINECTOMY/DECOMPRESSION MICRODISCECTOMY 2 LEVELS;  Surgeon: [name], MD;  Location: [facility];  Service: Orthopedics;  Laterality: Left;  Decompressive laminectomy L4-L5. Microdiscectomy L5-S1   Skin removal surgery     extra abdominal skin removed from his weight loss   TONSILLECTOMY AND ADENOIDECTOMY  age 4 or 6   TOTAL KNEE ARTHROPLASTY  01/10/2012   Procedure: TOTAL KNEE ARTHROPLASTY;  Surgeon: [name], MD;  Location: [facility];  Service: Orthopedics;  Laterality: Left;   TOTAL KNEE ARTHROPLASTY Right 11/18/2020   Procedure: TOTAL KNEE ARTHROPLASTY;  Surgeon: Paralee Cancel, MD;  Location: [facility];  Service: Orthopedics;  Laterality: Right;  70 mins   UPPER  GASTROINTESTINAL ENDOSCOPY  03/18/2010    I have reviewed the social history and family history with the patient and they are unchanged from previous note.  ALLERGIES:  is allergic to adhesive [tape] and other.  MEDICATIONS:  Current Outpatient Medications  Medication Sig Dispense Refill   ferrous sulfate 325 (65 FE) MG EC tablet Take 1 tablet (325 mg total) by mouth daily. 30 tablet 3   Artificial Tear Ointment (ARTIFICIAL TEARS) ointment Place 1 drop into both eyes every 4 (four) hours as needed (dry eyes).      atorvastatin (LIPITOR) 80 MG tablet Take 1 tablet (80 mg total) by mouth daily. 90 tablet 2   Cholecalciferol (VITAMIN D3) 125 MCG (5000 UT) CAPS Take 5,000 Units by mouth daily.      folic acid (FOLVITE) 1 MG tablet Take 1 tablet (1 mg total) by mouth daily. 30 tablet 3   gabapentin (NEURONTIN) 100 MG capsule Take 2 capsules (200 mg total) by mouth at bedtime.  60 capsule 0   glipiZIDE (GLUCOTROL XL) 2.5 MG 24 hr tablet TAKE 1 TABLET (2.5 MG TOTAL) BY MOUTH DAILY WITH BREAKFAST. 90 tablet 3   metFORMIN (GLUCOPHAGE) 500 MG tablet TAKE 4 TABLETS BY MOUTH DAILY WITH BREAKFAST (Patient taking differently: Take 2,000 mg by mouth daily with breakfast.) 360 tablet 3   Multiple Vitamin (MULTIVITAMIN WITH MINERALS) TABS tablet Take 1 tablet by mouth daily at 12 noon.     nitroGLYCERIN (NITROSTAT) 0.4 MG SL tablet Place 1 tablet (0.4 mg total) under the tongue every 5 (five) minutes as needed for chest pain. 90 tablet 3   pantoprazole (PROTONIX) 40 MG tablet Take 1 tablet (40 mg total) by mouth 2 (two) times daily before a meal. 90 tablet 3   vitamin B-12 1000 MCG tablet Take 1 tablet (1,000 mcg total) by mouth daily. 30 tablet 1   No current facility-administered medications for this visit.    PHYSICAL EXAMINATION: ECOG PERFORMANCE STATUS: 1 - Symptomatic but completely ambulatory  Vitals:   11/11/21 0851  BP: 119/65  Pulse: 73  Resp: 18  Temp: 98.2 F (36.8 C)  SpO2: 100%    Filed Weights   11/11/21 0851  Weight: 252 lb 9 oz (114.6 kg)    GENERAL:alert, no distress and comfortable SKIN: No rash  EYES: sclera clear NECK: Without mass LYMPH:  no palpable cervical or supraclavicular lymphadenopathy LUNGS: clear with normal breathing effort HEART: regular rate & rhythm, no lower extremity edema ABDOMEN:abdomen soft, non-tender and normal bowel sounds Musculoskeletal: No focal spinal tenderness NEURO: alert & oriented x 3 with fluent speech, no focal motor/sensory deficits  LABORATORY DATA:  I have reviewed the data as listed CBC Latest Ref Rng & Units 11/11/2021 11/07/2021 11/06/2021  WBC 4.0 - 10.5 K/uL 6.5 - 7.3  Hemoglobin 13.0 - 17.0 g/dL 6.5(LL) 5.7(LL) 5.1(LL)  Hematocrit 39.0 - 52.0 % 21.9(L) 18.8(L) 16.3(L)  Platelets 150 - 400 K/uL 251 - 148(L)     CMP Latest Ref Rng & Units 11/06/2021 11/04/2021 11/03/2021  Glucose 70 - 99 mg/dL 129(H) 117(H) 168(H)  BUN 8 - 23 mg/dL 10 25(H) 36(H)  Creatinine 0.61 - 1.24 mg/dL 1.19 1.26(H) 1.67(H)  Sodium 135 - 145 mmol/L 134(L) 135 134(L)  Potassium 3.5 - 5.1 mmol/L 3.6 4.0 4.5  Chloride 98 - 111 mmol/L 105 110 107  CO2 22 - 32 mmol/L 26 24 21(L)  Calcium 8.9 - 10.3 mg/dL 8.3(L) 7.6(L) 8.1(L)  Total Protein 6.5 - 8.1 g/dL - - 4.9(L)  Total Bilirubin 0.3 - 1.2 mg/dL - - 0.5  Alkaline Phos 38 - 126 U/L - - 37(L)  AST 15 - 41 U/L - - 28  ALT 0 - 44 U/L - - 27      RADIOGRAPHIC STUDIES: I have personally reviewed the radiological images as listed and agreed with the findings in the report. No results found.   ASSESSMENT & PLAN: 65 yo male   Acute blood loss anemia, secondary to prostate biopsy  -He developed hematuria and underwent prostate biopsy for work-up, he developed rectal bleeding and was admitted for severe anemia 11/02/2021 - 11/08/2021 -Hemoglobin dropped to 4.9, he does not accept blood products as a Jehovah's Witness.  He underwent IR rectal artery embolization, vitamin K, Aranesp, IV  iron, and started on Z61 and folic acid. -Coumadin was stopped, no signs of recurrent thrombosis/PE -Given his new diagnosis prostate cancer we will hold off on subsequent Aranesp injections for now, will reserve for last resort if  hemoglobin drops below 5-6  again -Mr. Rupnow appears stable.  No evidence of recurrent bleeding.  He continues oral P95 and folic acid.  I recommend to start oral iron once daily with vitamin C to promote absorption.   -Todays hgb 6.5, he remains symptomatic with dizziness and headache. Ferritin is pending. We will give Feraheme x2 this week and next.  -I reviewed historical CBC which shows microcytosis and intermittent mild anemia with MCV as low as 70 and Hgb 10-13 since at least 2018 -If anemia does not resolve with adequate iron replacement he may require further testing.  He may also have a component of anemia of chronic disease Lab weekly x4 and f/up in 4 weeks, will arrange additional iron as needed   H/o provoked bilateral PE after orthopedic surgery 2007 -He was on Coumadin for 15 years after PE, held during hospitalization for acute blood loss anemia  -Seen by Dr. From inpatient 11/06/2021 who recommends against resuming anticoagulation -Currently no signs of recurrent thrombosis or PE  Prostate cancer [id number], Gleason 7 -This appears intermediate risk, further care plan per Dr. Louis [facility]el at [facility] -Follow-up scheduled 11/24/2021  Plan: -Labs reviewed, Hgb 6.5 ferritin is pending -IV Feraheme today and next week, begin oral ferrous sulfate once daily with Vit C -Continue oral K93 and folic acid -Lab weekly x4, will add on additional iron if needed  -F/up in 4 weeks -no signs of recurrent thrombosis/PE, continue off coumadin  -Ok to start ASA 81 mg if no recurrent bleeding and hgb >8 -Will consider additional EPO if hgb does not improve or drops <5-6 -Follow-up Dr. Louis Meckel [facility] 12/20 re: prostate cancer   All questions were answered.  The patient knows to call the clinic with any problems, questions or concerns. No barriers to learning was detected. I spent 30 minutes counseling the patient face to face. The total time spent in the appointment was 40 minutes and more than 50% was on counseling and review of test results     [name] Feeling, NP 11/11/21

## 2021-11-11 ENCOUNTER — Telehealth: Payer: Self-pay | Admitting: *Deleted

## 2021-11-11 ENCOUNTER — Inpatient Hospital Stay: Payer: HMO | Attending: Nurse Practitioner | Admitting: Nurse Practitioner

## 2021-11-11 ENCOUNTER — Encounter: Payer: Self-pay | Admitting: Nurse Practitioner

## 2021-11-11 ENCOUNTER — Other Ambulatory Visit: Payer: Self-pay

## 2021-11-11 ENCOUNTER — Inpatient Hospital Stay: Payer: HMO

## 2021-11-11 VITALS — BP 119/65 | HR 73 | Temp 98.2°F | Resp 18 | Wt 252.6 lb

## 2021-11-11 VITALS — BP 109/66 | HR 52 | Temp 98.5°F

## 2021-11-11 DIAGNOSIS — I2510 Atherosclerotic heart disease of native coronary artery without angina pectoris: Secondary | ICD-10-CM | POA: Diagnosis not present

## 2021-11-11 DIAGNOSIS — E1122 Type 2 diabetes mellitus with diabetic chronic kidney disease: Secondary | ICD-10-CM | POA: Diagnosis not present

## 2021-11-11 DIAGNOSIS — N183 Chronic kidney disease, stage 3 unspecified: Secondary | ICD-10-CM | POA: Insufficient documentation

## 2021-11-11 DIAGNOSIS — E559 Vitamin D deficiency, unspecified: Secondary | ICD-10-CM | POA: Insufficient documentation

## 2021-11-11 DIAGNOSIS — I129 Hypertensive chronic kidney disease with stage 1 through stage 4 chronic kidney disease, or unspecified chronic kidney disease: Secondary | ICD-10-CM | POA: Diagnosis not present

## 2021-11-11 DIAGNOSIS — E785 Hyperlipidemia, unspecified: Secondary | ICD-10-CM | POA: Insufficient documentation

## 2021-11-11 DIAGNOSIS — E538 Deficiency of other specified B group vitamins: Secondary | ICD-10-CM

## 2021-11-11 DIAGNOSIS — C61 Malignant neoplasm of prostate: Secondary | ICD-10-CM | POA: Diagnosis not present

## 2021-11-11 DIAGNOSIS — D6489 Other specified anemias: Secondary | ICD-10-CM | POA: Diagnosis not present

## 2021-11-11 DIAGNOSIS — Z79899 Other long term (current) drug therapy: Secondary | ICD-10-CM | POA: Insufficient documentation

## 2021-11-11 DIAGNOSIS — D62 Acute posthemorrhagic anemia: Secondary | ICD-10-CM

## 2021-11-11 DIAGNOSIS — Z86711 Personal history of pulmonary embolism: Secondary | ICD-10-CM | POA: Diagnosis not present

## 2021-11-11 DIAGNOSIS — Z7901 Long term (current) use of anticoagulants: Secondary | ICD-10-CM | POA: Insufficient documentation

## 2021-11-11 LAB — CBC WITH DIFFERENTIAL (CANCER CENTER ONLY)
Abs Immature Granulocytes: 0.02 10*3/uL (ref 0.00–0.07)
Basophils Absolute: 0 10*3/uL (ref 0.0–0.1)
Basophils Relative: 0 %
Eosinophils Absolute: 0.2 10*3/uL (ref 0.0–0.5)
Eosinophils Relative: 3 %
HCT: 21.9 % — ABNORMAL LOW (ref 39.0–52.0)
Hemoglobin: 6.5 g/dL — CL (ref 13.0–17.0)
Immature Granulocytes: 0 %
Lymphocytes Relative: 24 %
Lymphs Abs: 1.6 10*3/uL (ref 0.7–4.0)
MCH: 23 pg — ABNORMAL LOW (ref 26.0–34.0)
MCHC: 29.7 g/dL — ABNORMAL LOW (ref 30.0–36.0)
MCV: 77.4 fL — ABNORMAL LOW (ref 80.0–100.0)
Monocytes Absolute: 0.5 10*3/uL (ref 0.1–1.0)
Monocytes Relative: 8 %
Neutro Abs: 4.2 10*3/uL (ref 1.7–7.7)
Neutrophils Relative %: 65 %
Platelet Count: 251 10*3/uL (ref 150–400)
RBC: 2.83 MIL/uL — ABNORMAL LOW (ref 4.22–5.81)
RDW: 21 % — ABNORMAL HIGH (ref 11.5–15.5)
WBC Count: 6.5 10*3/uL (ref 4.0–10.5)
nRBC: 0.6 % — ABNORMAL HIGH (ref 0.0–0.2)

## 2021-11-11 LAB — FERRITIN: Ferritin: 219 ng/mL (ref 24–336)

## 2021-11-11 MED ORDER — FOLIC ACID 1 MG PO TABS: 1.0000 mg | ORAL_TABLET | Freq: Every day | ORAL | 3 refills | Status: DC

## 2021-11-11 MED ORDER — SODIUM CHLORIDE 0.9 % IV SOLN
510.0000 mg | Freq: Once | INTRAVENOUS | Status: AC
  Administered 2021-11-11: 510 mg via INTRAVENOUS
  Filled 2021-11-11: qty 17

## 2021-11-11 MED ORDER — SODIUM CHLORIDE 0.9 % IV SOLN: Freq: Once | INTRAVENOUS | Status: AC

## 2021-11-11 MED ORDER — LORATADINE 10 MG PO TABS
10.0000 mg | ORAL_TABLET | Freq: Every day | ORAL | Status: DC
  Administered 2021-11-11: 10 mg via ORAL
  Filled 2021-11-11: qty 1

## 2021-11-11 MED ORDER — FERROUS SULFATE 325 (65 FE) MG PO TBEC: 325.0000 mg | DELAYED_RELEASE_TABLET | Freq: Every day | ORAL | 3 refills | Status: DC

## 2021-11-11 NOTE — Progress Notes (Signed)
Notified Cira Rue, NP of pt's Hbg 6.5 today.  Lacie placed order for Feraheme since pt is a Jehovah Witness.  Pt received financial clearance approval for Feraheme and iron infusion will be done today in [facility].

## 2021-11-11 NOTE — Telephone Encounter (Signed)
Review of d/c summary for pt reports pt has been taken off of coumadin.  LVM for pt to discuss he no longer needs to come to coag clinic.

## 2021-11-11 NOTE — Patient Instructions (Signed)

## 2021-11-11 NOTE — Telephone Encounter (Signed)
Per Cira Rue, NP, office note from today was faxed to [place] office for review to [phone] at [facility]. Confirmation was received.

## 2021-11-12 ENCOUNTER — Ambulatory Visit: Payer: Self-pay

## 2021-11-12 ENCOUNTER — Telehealth: Payer: Self-pay | Admitting: Internal Medicine

## 2021-11-12 NOTE — Telephone Encounter (Signed)
Left another VM for pt.  Coumadin episode has been resolved.

## 2021-11-12 NOTE — Telephone Encounter (Signed)
Left message for patient to call back to schedule Medicare Annual Wellness Visit   Last AWV  10/07/20  Please schedule at anytime with [facility] if patient calls the office back.    40 Minutes appointment   Any questions, please call me at [phone]

## 2021-11-13 ENCOUNTER — Encounter: Payer: Self-pay | Admitting: Internal Medicine

## 2021-11-13 ENCOUNTER — Other Ambulatory Visit: Payer: Self-pay

## 2021-11-13 ENCOUNTER — Ambulatory Visit (INDEPENDENT_AMBULATORY_CARE_PROVIDER_SITE_OTHER): Payer: HMO | Admitting: Internal Medicine

## 2021-11-13 VITALS — BP 108/60 | HR 55 | Temp 98.2°F | Ht 74.0 in | Wt 252.0 lb

## 2021-11-13 DIAGNOSIS — I10 Essential (primary) hypertension: Secondary | ICD-10-CM | POA: Diagnosis not present

## 2021-11-13 DIAGNOSIS — K922 Gastrointestinal hemorrhage, unspecified: Secondary | ICD-10-CM | POA: Diagnosis not present

## 2021-11-13 DIAGNOSIS — C61 Malignant neoplasm of prostate: Secondary | ICD-10-CM | POA: Diagnosis not present

## 2021-11-13 DIAGNOSIS — N183 Chronic kidney disease, stage 3 unspecified: Secondary | ICD-10-CM | POA: Diagnosis not present

## 2021-11-13 DIAGNOSIS — Z531 Procedure and treatment not carried out because of patient's decision for reasons of belief and group pressure: Secondary | ICD-10-CM | POA: Diagnosis not present

## 2021-11-13 DIAGNOSIS — E1122 Type 2 diabetes mellitus with diabetic chronic kidney disease: Secondary | ICD-10-CM | POA: Diagnosis not present

## 2021-11-13 DIAGNOSIS — Z7901 Long term (current) use of anticoagulants: Secondary | ICD-10-CM

## 2021-11-13 MED ORDER — PANTOPRAZOLE SODIUM 40 MG PO TBEC: 40.0000 mg | DELAYED_RELEASE_TABLET | Freq: Two times a day (BID) | ORAL | 3 refills | Status: DC

## 2021-11-13 NOTE — Progress Notes (Signed)
Patient ID: [name], male   DOB: 1956/03/21, 65 y.o.   MRN: [id number]       Chief Complaint: follow up recent GIB, anemia       HPI:  [name] is a 65 y.o. male here after hospn nov 28 - dec 4 with GIB s/p transrectal prostate biopsy nov 21, now with dx prostate Ca, and presented with large lower GI bleeding, nadir Hgb 4.9.    Patient underwent reversal with vitamin K in the ER embolization on 11/02/2021.  Patient continues to have bright red blood per rectum.  GI was consulted.  IR consulted and patient underwent mesenteric and pelvic angiogram with particle embolization of superior rectal artery with Gelfoam embolization of anterior division of the right internal iliac artery.  Pt is Jehovah Witness and declined transfusion.  Pt also seen per heme/onc - recommending supportive care with IV iron, folic acid and V40 supplements.  Hematology also recommending discontinuation of anticoagulation with Coumadin as patient's provoked PE/DVT was almost 15 years ago, without any recurrent clots   He also received a dose of Epogen on 11/04/21.  As outpt, f/u CBC dec 7 - Hgb 6.5.  Pt has planned f/u with Dr Burr Medico, Pt s/p iron infusion x 2 while hospd, now for weekly iron infusion f/u for it seems at least 3 more weeks.  Pt on PPI bid but rx only given quantity for one per day and needs rx corrected.  Denies worsening reflux, abd pain, dysphagia, n/v, bowel change or blood  Pt denies chest pain, increased sob or doe, wheezing, orthopnea, PND, increased LE swelling, palpitations, dizziness or syncope    No new neuro or other new complaints.  Pt also f/u as planned with GU, GI.    Transitional Care Management elements noted today: 1)  Date of D/C: as above 2)  Medication reconciliation:  done today at end visit 3)  Review of D/C summary or other information:  done today 4)  Review of need for f/u on pending diagnostic tests and treatments:  done today - none noted 5)  Review of need for Interaction with  other providers who will assume or resume care of pt specific problems: done today as above 6)  Education of patient/family/guardian or caregiver: here with wife, agrees with plan  Wt Readings from Last 3 Encounters:  11/13/21 252 lb (114.3 kg)  11/11/21 252 lb 9 oz (114.6 kg)  11/04/21 268 lb 15.4 oz (122 kg)   BP Readings from Last 3 Encounters:  11/13/21 108/60  11/11/21 109/66  11/11/21 119/65         Past Medical History:  Diagnosis Date   Arthritis    BACK PAIN    CAD (coronary artery disease) 06/28/2017   CAD in native artery    a. moderate by cardiac CT 2016.   Chronic bilateral low back pain with bilateral sciatica 01/12/2019   Degenerative arthritis of right knee 07/15/2016   Injected in 07/15/2016 Orthovisc injection started 10/07/2016 DepoMedrol 40 inj on 04/11/17, 25 cc aspirated Orthovisc injection in 05/30/2017     DEGENERATIVE JOINT DISEASE, KNEES, BILATERAL    Depression    Diabetes ([place]) 10/01/2014   Diabetes mellitus    type II   DIVERTICULOSIS, COLON    DVT, lower extremity ([place])    right   History of kidney stones    Hyperlipidemia    Hypertension    HYPOGONADISM, MALE    HYPOTENSION, ORTHOSTATIC    Kidney stones  Long term (current) use of anticoagulants 08/17/2017   Long term current use of anticoagulant    Lumbosacral radiculopathy at S1 06/14/2012   Maxillary sinus cyst 12/11/2018   Obesity    OBESITY, MORBID 03/04/2008   Qualifier: Diagnosis of  By: Jenny Reichmann MD, Hunt Oris    OSA (obstructive sleep apnea) 09/11/2013   Pulmonary emboli (HCC)    Pulmonary embolism ([place]) 02/05/2011   RBBB 07/28/2017   Renal insufficiency    a. prior h/o, does not appear chronic.   Superficial phlebitis of arm 03/18/2016   Vitamin D deficiency 07/09/2020   Past Surgical History:  Procedure Laterality Date   ankle surgury  2001 bilateral   with bone spurs and torn tendon   COLONOSCOPY  03/18/2010   EDG  2008   chronic Duodenitis   FLEXIBLE SIGMOIDOSCOPY N/A 11/03/2021    Procedure: FLEXIBLE SIGMOIDOSCOPY;  Surgeon: Doran Stabler, MD;  Location: [facility];  Service: Gastroenterology;  Laterality: N/A;   GASTRIC BYPASS     HEEL SPUR SURGERY Bilateral 2005   IR ANGIOGRAM PELVIS SELECTIVE OR SUPRASELECTIVE  11/02/2021   IR ANGIOGRAM PELVIS SELECTIVE OR SUPRASELECTIVE  11/04/2021   IR ANGIOGRAM SELECTIVE EACH ADDITIONAL VESSEL  11/02/2021   IR ANGIOGRAM SELECTIVE EACH ADDITIONAL VESSEL  11/02/2021   IR ANGIOGRAM SELECTIVE EACH ADDITIONAL VESSEL  11/02/2021   IR ANGIOGRAM SELECTIVE EACH ADDITIONAL VESSEL  11/04/2021   IR ANGIOGRAM SELECTIVE EACH ADDITIONAL VESSEL  11/04/2021   IR ANGIOGRAM SELECTIVE EACH ADDITIONAL VESSEL  11/04/2021   IR ANGIOGRAM SELECTIVE EACH ADDITIONAL VESSEL  11/04/2021   IR ANGIOGRAM VISCERAL SELECTIVE  11/02/2021   IR ANGIOGRAM VISCERAL SELECTIVE  11/04/2021   IR EMBO ART  VEN HEMORR LYMPH EXTRAV  INC GUIDE ROADMAPPING  11/02/2021   IR EMBO ART  VEN HEMORR LYMPH EXTRAV  INC GUIDE ROADMAPPING  11/04/2021   IR EMBO ART  VEN HEMORR LYMPH EXTRAV  INC GUIDE ROADMAPPING  11/04/2021   IR US GUIDE VASC ACCESS RIGHT  11/02/2021   IR US GUIDE [place] RIGHT  11/04/2021   knee surgury  2000 & 2003    cartilage damage   LEFT HEART CATH AND CORONARY ANGIOGRAPHY N/A 02/16/2021   Procedure: LEFT HEART CATH AND CORONARY ANGIOGRAPHY;  Surgeon: Sherren Mocha, MD;  Location: [facility];  Service: Cardiovascular;  Laterality: N/A;   LUMBAR LAMINECTOMY/DECOMPRESSION MICRODISCECTOMY  09/28/2012   Procedure: LUMBAR LAMINECTOMY/DECOMPRESSION MICRODISCECTOMY 2 LEVELS;  Surgeon: [name], MD;  Location: [facility];  Service: Orthopedics;  Laterality: Left;  Decompressive laminectomy L4-L5. Microdiscectomy L5-S1   Skin removal surgery     extra abdominal skin removed from his weight loss   TONSILLECTOMY AND ADENOIDECTOMY  age 41 or 6   TOTAL KNEE ARTHROPLASTY  01/10/2012   Procedure: TOTAL KNEE ARTHROPLASTY;  Surgeon: [name], MD;   Location: [facility];  Service: Orthopedics;  Laterality: Left;   TOTAL KNEE ARTHROPLASTY Right 11/18/2020   Procedure: TOTAL KNEE ARTHROPLASTY;  Surgeon: Paralee Cancel, MD;  Location: [facility];  Service: Orthopedics;  Laterality: Right;  70 mins   UPPER GASTROINTESTINAL ENDOSCOPY  03/18/2010    reports that he quit smoking about 42 years ago. His smoking use included cigarettes. He has a 1.25 pack-year smoking history. He has never used smokeless tobacco. He reports current alcohol use of about 2.0 standard drinks per week. He reports that he does not use drugs. family history includes Diabetes in his brother, sister, sister, and sister; Heart failure in his  sister; Hypertension in his mother; Prostate cancer in his father. Allergies  Allergen Reactions   Adhesive [Tape] Other (See Comments)    Painful, Please use "paper" tape   Other     NO BLOOD -JEHOVAH'S WITNESS-SIGNED REFUSAL BUT WOULD TAKE ALBUMIN   Current Outpatient Medications on File Prior to Visit  Medication Sig Dispense Refill   Artificial Tear Ointment (ARTIFICIAL TEARS) ointment Place 1 drop into both eyes every 4 (four) hours as needed (dry eyes).      atorvastatin (LIPITOR) 80 MG tablet Take 1 tablet (80 mg total) by mouth daily. 90 tablet 2   Cholecalciferol (VITAMIN D3) 125 MCG (5000 UT) CAPS Take 5,000 Units by mouth daily.      ferrous sulfate 325 (65 FE) MG EC tablet Take 1 tablet (325 mg total) by mouth daily. 30 tablet 3   folic acid (FOLVITE) 1 MG tablet Take 1 tablet (1 mg total) by mouth daily. 30 tablet 3   gabapentin (NEURONTIN) 100 MG capsule Take 2 capsules (200 mg total) by mouth at bedtime. 60 capsule 0   glipiZIDE (GLUCOTROL XL) 2.5 MG 24 hr tablet TAKE 1 TABLET (2.5 MG TOTAL) BY MOUTH DAILY WITH BREAKFAST. 90 tablet 3   metFORMIN (GLUCOPHAGE) 500 MG tablet TAKE 4 TABLETS BY MOUTH DAILY WITH BREAKFAST (Patient taking differently: Take 2,000 mg by mouth daily with breakfast.) 360 tablet 3   Multiple Vitamin  (MULTIVITAMIN WITH MINERALS) TABS tablet Take 1 tablet by mouth daily at 12 noon.     nitroGLYCERIN (NITROSTAT) 0.4 MG SL tablet Place 1 tablet (0.4 mg total) under the tongue every 5 (five) minutes as needed for chest pain. 90 tablet 3   vitamin B-12 1000 MCG tablet Take 1 tablet (1,000 mcg total) by mouth daily. 30 tablet 1   No current facility-administered medications on file prior to visit.        ROS:  All others reviewed and negative.  Objective        PE:  BP 108/60 (BP Location: Right Arm, Patient Position: Sitting, Cuff Size: Large)   Pulse (!) 55   Temp 98.2 F (36.8 C) (Oral)   Ht 6\' 2"  (1.88 m)   Wt 252 lb (114.3 kg)   SpO2 99%   BMI 32.35 kg/m                 Constitutional: Pt appears in NAD               HENT: Head: NCAT.                Right Ear: External ear normal.                 Left Ear: External ear normal.                Eyes: . Pupils are equal, round, and reactive to light. Conjunctivae and EOM are normal               Nose: without d/c or deformity               Neck: Neck supple. Gross normal ROM               Cardiovascular: Normal rate and regular rhythm.                 Pulmonary/Chest: Effort normal and breath sounds without rales or wheezing.  Abd:  Soft, NT, ND, + BS, no organomegaly               Neurological: Pt is alert. At baseline orientation, motor grossly intact               Skin: Skin is warm. No rashes, no other new lesions, LE edema - none               Psychiatric: Pt behavior is normal without agitation   Micro: none  Cardiac tracings I have personally interpreted today:  none  Pertinent Radiological findings (summarize): none   Lab Results  Component Value Date   WBC 6.5 11/11/2021   HGB 6.5 (LL) 11/11/2021   HCT 21.9 (L) 11/11/2021   PLT 251 11/11/2021   GLUCOSE 129 (H) 11/06/2021   CHOL 116 09/11/2021   TRIG 58.0 09/11/2021   HDL 47.20 09/11/2021   LDLDIRECT 116.0 01/15/2020   LDLCALC 58 09/11/2021    ALT 27 11/03/2021   AST 28 11/03/2021   NA 134 (L) 11/06/2021   K 3.6 11/06/2021   CL 105 11/06/2021   CREATININE 1.19 11/06/2021   BUN 10 11/06/2021   CO2 26 11/06/2021   TSH 1.54 03/10/2021   PSA 5.05 (H) 09/11/2021   INR 1.2 11/03/2021   HGBA1C 7.2 (H) 09/11/2021   MICROALBUR 1.6 03/10/2021   Assessment/Plan:  [name] is a 64 y.o. Black or African American [2] male with  has a past medical history of Arthritis, BACK PAIN, CAD (coronary artery disease) (06/28/2017), CAD in native artery, Chronic bilateral low back pain with bilateral sciatica (01/12/2019), Degenerative arthritis of right knee (07/15/2016), DEGENERATIVE JOINT DISEASE, KNEES, BILATERAL, Depression, Diabetes ([place]) (10/01/2014), Diabetes mellitus, DIVERTICULOSIS, COLON, DVT, lower extremity ([place]), History of kidney stones, Hyperlipidemia, Hypertension, HYPOGONADISM, MALE, HYPOTENSION, ORTHOSTATIC, Kidney stones, Long term (current) use of anticoagulants (08/17/2017), Long term current use of anticoagulant, Lumbosacral radiculopathy at S1 (06/14/2012), Maxillary sinus cyst (12/11/2018), Obesity, OBESITY, MORBID (03/04/2008), OSA (obstructive sleep apnea) (09/11/2013), Pulmonary emboli ([place]), Pulmonary embolism ([place]) (02/05/2011), RBBB (07/28/2017), Renal insufficiency, Superficial phlebitis of arm (03/18/2016), and Vitamin D deficiency (07/09/2020).  GIB (gastrointestinal bleeding) Overall stable with plan for contd avoidance blood products but for iron infusions to continue; note recent ferritin normal; and f/u heme and GI as planned  Refusal of blood transfusions as patient is Jehovah's Witness No change in status, confirmed again today  Essential hypertension BP is low normal BP Readings from Last 3 Encounters:  11/13/21 108/60  11/11/21 109/66  11/11/21 119/65  pt not currently on vasoactive med, cont to follow  Long term current use of anticoagulant None further given recent bleeding,  to f/u any worsening symptoms or  concerns  Diabetes ([place]) Lab Results  Component Value Date   HGBA1C 7.2 (H) 09/11/2021   Stable, pt to continue current medical treatment glipizide   Prostate CA ([place]) New diagnosis, intermediate risk , for f/u urology as planned  Followup: Return if symptoms worsen or fail to improve.  Cathlean Cower, MD 11/15/2021 7:20 AM [facility]

## 2021-11-13 NOTE — Patient Instructions (Signed)
Please continue all other medications as before, including the protonix at twice per day  Ok to [facility] the glipizide if you think you are unable to eat well for some reason in the future  Please have the pharmacy call with any other refills you may need.  Please continue your efforts at being more active, low cholesterol diet, and weight control  Please keep your appointments with your specialists as you may have planned - urology next wk, and Dr Burr Medico and Gastroenterology as you have planned  Please make an Appointment to return in 6 months, or sooner if needed

## 2021-11-13 NOTE — Telephone Encounter (Signed)
Pt LVM reporting he is doing well and he has been taken off of coumadin. He said he would call if anything was needed.

## 2021-11-15 ENCOUNTER — Encounter: Payer: Self-pay | Admitting: Internal Medicine

## 2021-11-15 DIAGNOSIS — C61 Malignant neoplasm of prostate: Secondary | ICD-10-CM | POA: Insufficient documentation

## 2021-11-15 NOTE — Assessment & Plan Note (Signed)
Lab Results  Component Value Date   HGBA1C 7.2 (H) 09/11/2021   Stable, pt to continue current medical treatment glipizide

## 2021-11-15 NOTE — Assessment & Plan Note (Signed)
None further given recent bleeding,  to f/u any worsening symptoms or concerns

## 2021-11-15 NOTE — Assessment & Plan Note (Signed)
BP is low normal BP Readings from Last 3 Encounters:  11/13/21 108/60  11/11/21 109/66  11/11/21 119/65  pt not currently on vasoactive med, cont to follow

## 2021-11-15 NOTE — Assessment & Plan Note (Signed)
New diagnosis, intermediate risk , for f/u urology as planned

## 2021-11-15 NOTE — Assessment & Plan Note (Signed)
Overall stable with plan for contd avoidance blood products but for iron infusions to continue; note recent ferritin normal; and f/u heme and GI as planned

## 2021-11-15 NOTE — Assessment & Plan Note (Signed)
No change in status, confirmed again today

## 2021-11-18 ENCOUNTER — Other Ambulatory Visit: Payer: Self-pay

## 2021-11-18 DIAGNOSIS — D62 Acute posthemorrhagic anemia: Secondary | ICD-10-CM

## 2021-11-19 ENCOUNTER — Encounter: Payer: Self-pay | Admitting: Nurse Practitioner

## 2021-11-19 ENCOUNTER — Other Ambulatory Visit: Payer: Self-pay | Admitting: Nurse Practitioner

## 2021-11-19 ENCOUNTER — Other Ambulatory Visit: Payer: Self-pay

## 2021-11-19 ENCOUNTER — Emergency Department (HOSPITAL_COMMUNITY): Payer: HMO

## 2021-11-19 ENCOUNTER — Emergency Department (HOSPITAL_COMMUNITY)
Admission: EM | Admit: 2021-11-19 | Discharge: 2021-11-19 | Disposition: A | Discharge status: Home / Self Care | Payer: HMO | Attending: Emergency Medicine | Admitting: Emergency Medicine

## 2021-11-19 ENCOUNTER — Inpatient Hospital Stay: Payer: HMO

## 2021-11-19 ENCOUNTER — Encounter (HOSPITAL_COMMUNITY): Payer: Self-pay | Admitting: Emergency Medicine

## 2021-11-19 VITALS — BP 120/63 | HR 55 | Temp 98.7°F | Resp 17

## 2021-11-19 DIAGNOSIS — D62 Acute posthemorrhagic anemia: Secondary | ICD-10-CM

## 2021-11-19 DIAGNOSIS — R319 Hematuria, unspecified: Secondary | ICD-10-CM | POA: Diagnosis not present

## 2021-11-19 DIAGNOSIS — Z87442 Personal history of urinary calculi: Secondary | ICD-10-CM | POA: Insufficient documentation

## 2021-11-19 DIAGNOSIS — E1122 Type 2 diabetes mellitus with diabetic chronic kidney disease: Secondary | ICD-10-CM | POA: Diagnosis not present

## 2021-11-19 DIAGNOSIS — Z96653 Presence of artificial knee joint, bilateral: Secondary | ICD-10-CM | POA: Diagnosis not present

## 2021-11-19 DIAGNOSIS — Z8546 Personal history of malignant neoplasm of prostate: Secondary | ICD-10-CM | POA: Insufficient documentation

## 2021-11-19 DIAGNOSIS — Z7984 Long term (current) use of oral hypoglycemic drugs: Secondary | ICD-10-CM | POA: Insufficient documentation

## 2021-11-19 DIAGNOSIS — Z87891 Personal history of nicotine dependence: Secondary | ICD-10-CM | POA: Insufficient documentation

## 2021-11-19 DIAGNOSIS — K8020 Calculus of gallbladder without cholecystitis without obstruction: Secondary | ICD-10-CM | POA: Diagnosis not present

## 2021-11-19 DIAGNOSIS — I2510 Atherosclerotic heart disease of native coronary artery without angina pectoris: Secondary | ICD-10-CM | POA: Diagnosis not present

## 2021-11-19 DIAGNOSIS — D649 Anemia, unspecified: Secondary | ICD-10-CM | POA: Diagnosis not present

## 2021-11-19 DIAGNOSIS — I129 Hypertensive chronic kidney disease with stage 1 through stage 4 chronic kidney disease, or unspecified chronic kidney disease: Secondary | ICD-10-CM | POA: Diagnosis not present

## 2021-11-19 DIAGNOSIS — N183 Chronic kidney disease, stage 3 unspecified: Secondary | ICD-10-CM | POA: Insufficient documentation

## 2021-11-19 DIAGNOSIS — Z8616 Personal history of COVID-19: Secondary | ICD-10-CM | POA: Insufficient documentation

## 2021-11-19 DIAGNOSIS — M79605 Pain in left leg: Secondary | ICD-10-CM

## 2021-11-19 DIAGNOSIS — R1013 Epigastric pain: Secondary | ICD-10-CM | POA: Insufficient documentation

## 2021-11-19 DIAGNOSIS — R109 Unspecified abdominal pain: Secondary | ICD-10-CM | POA: Diagnosis not present

## 2021-11-19 LAB — COMPREHENSIVE METABOLIC PANEL
ALT: 19 U/L (ref 0–44)
AST: 19 U/L (ref 15–41)
Albumin: 3.4 g/dL — ABNORMAL LOW (ref 3.5–5.0)
Alkaline Phosphatase: 49 U/L (ref 38–126)
Anion gap: 8 (ref 5–15)
BUN: 20 mg/dL (ref 8–23)
CO2: 25 mmol/L (ref 22–32)
Calcium: 8.7 mg/dL — ABNORMAL LOW (ref 8.9–10.3)
Chloride: 109 mmol/L (ref 98–111)
Creatinine, Ser: 1.32 mg/dL — ABNORMAL HIGH (ref 0.61–1.24)
GFR, Estimated: 59 mL/min — ABNORMAL LOW (ref >60)
Glucose, Bld: 130 mg/dL — ABNORMAL HIGH (ref 70–99)
Potassium: 4 mmol/L (ref 3.5–5.1)
Sodium: 142 mmol/L (ref 135–145)
Total Bilirubin: 0.5 mg/dL (ref 0.3–1.2)
Total Protein: 5.9 g/dL — ABNORMAL LOW (ref 6.5–8.1)

## 2021-11-19 LAB — CBC WITH DIFFERENTIAL/PLATELET
Abs Immature Granulocytes: 0.02 10*3/uL (ref 0.00–0.07)
Basophils Absolute: 0 10*3/uL (ref 0.0–0.1)
Basophils Relative: 0 %
Eosinophils Absolute: 0.1 10*3/uL (ref 0.0–0.5)
Eosinophils Relative: 2 %
HCT: 26.1 % — ABNORMAL LOW (ref 39.0–52.0)
Hemoglobin: 7.5 g/dL — ABNORMAL LOW (ref 13.0–17.0)
Immature Granulocytes: 0 %
Lymphocytes Relative: 27 %
Lymphs Abs: 1.5 10*3/uL (ref 0.7–4.0)
MCH: 23.5 pg — ABNORMAL LOW (ref 26.0–34.0)
MCHC: 28.7 g/dL — ABNORMAL LOW (ref 30.0–36.0)
MCV: 81.8 fL (ref 80.0–100.0)
Monocytes Absolute: 0.3 10*3/uL (ref 0.1–1.0)
Monocytes Relative: 6 %
Neutro Abs: 3.6 10*3/uL (ref 1.7–7.7)
Neutrophils Relative %: 65 %
Platelets: 279 10*3/uL (ref 150–400)
RBC: 3.19 MIL/uL — ABNORMAL LOW (ref 4.22–5.81)
RDW: 21.7 % — ABNORMAL HIGH (ref 11.5–15.5)
WBC: 5.6 10*3/uL (ref 4.0–10.5)
nRBC: 0 % (ref 0.0–0.2)

## 2021-11-19 LAB — IRON AND TIBC
Iron: 61 ug/dL (ref 42–163)
Saturation Ratios: 24 % (ref 20–55)
TIBC: 258 ug/dL (ref 202–409)
UIBC: 196 ug/dL (ref 117–376)

## 2021-11-19 LAB — CBC WITH DIFFERENTIAL (CANCER CENTER ONLY)
Abs Immature Granulocytes: 0.01 10*3/uL (ref 0.00–0.07)
Basophils Absolute: 0 10*3/uL (ref 0.0–0.1)
Basophils Relative: 0 %
Eosinophils Absolute: 0.1 10*3/uL (ref 0.0–0.5)
Eosinophils Relative: 2 %
HCT: 28.1 % — ABNORMAL LOW (ref 39.0–52.0)
Hemoglobin: 8.3 g/dL — ABNORMAL LOW (ref 13.0–17.0)
Immature Granulocytes: 0 %
Lymphocytes Relative: 22 %
Lymphs Abs: 1.5 10*3/uL (ref 0.7–4.0)
MCH: 23.4 pg — ABNORMAL LOW (ref 26.0–34.0)
MCHC: 29.5 g/dL — ABNORMAL LOW (ref 30.0–36.0)
MCV: 79.4 fL — ABNORMAL LOW (ref 80.0–100.0)
Monocytes Absolute: 0.4 10*3/uL (ref 0.1–1.0)
Monocytes Relative: 5 %
Neutro Abs: 4.8 10*3/uL (ref 1.7–7.7)
Neutrophils Relative %: 71 %
Platelet Count: 297 10*3/uL (ref 150–400)
RBC: 3.54 MIL/uL — ABNORMAL LOW (ref 4.22–5.81)
RDW: 21.1 % — ABNORMAL HIGH (ref 11.5–15.5)
WBC Count: 6.9 10*3/uL (ref 4.0–10.5)
nRBC: 0 % (ref 0.0–0.2)

## 2021-11-19 LAB — URINALYSIS, ROUTINE W REFLEX MICROSCOPIC
Bacteria, UA: NONE SEEN
Bilirubin Urine: NEGATIVE
Glucose, UA: NEGATIVE mg/dL
Ketones, ur: NEGATIVE mg/dL
Leukocytes,Ua: NEGATIVE
Nitrite: NEGATIVE
Protein, ur: NEGATIVE mg/dL
Specific Gravity, Urine: 1.025 (ref 1.005–1.030)
pH: 5.5 (ref 5.0–8.0)

## 2021-11-19 LAB — LIPASE, BLOOD: Lipase: 36 U/L (ref 11–51)

## 2021-11-19 LAB — FERRITIN: Ferritin: 198 ng/mL (ref 24–336)

## 2021-11-19 MED ORDER — SODIUM CHLORIDE 0.9 % IV SOLN
510.0000 mg | Freq: Once | INTRAVENOUS | Status: AC
  Administered 2021-11-19: 510 mg via INTRAVENOUS
  Filled 2021-11-19: qty 510

## 2021-11-19 MED ORDER — LORATADINE 10 MG PO TABS
10.0000 mg | ORAL_TABLET | Freq: Every day | ORAL | Status: DC
  Administered 2021-11-19: 10 mg via ORAL
  Filled 2021-11-19: qty 1

## 2021-11-19 MED ORDER — IOHEXOL 350 MG/ML SOLN
80.0000 mL | Freq: Once | INTRAVENOUS | Status: AC | PRN
  Administered 2021-11-19: 80 mL via INTRAVENOUS

## 2021-11-19 MED ORDER — SODIUM CHLORIDE 0.9 % IV BOLUS
1000.0000 mL | Freq: Once | INTRAVENOUS | Status: AC
  Administered 2021-11-19: 1000 mL via INTRAVENOUS

## 2021-11-19 MED ORDER — ONDANSETRON HCL 4 MG/2ML IJ SOLN
4.0000 mg | Freq: Once | INTRAMUSCULAR | Status: AC
  Administered 2021-11-19: 4 mg via INTRAVENOUS
  Filled 2021-11-19: qty 2

## 2021-11-19 MED ORDER — SODIUM CHLORIDE 0.9 % IV SOLN: Freq: Once | INTRAVENOUS | Status: AC

## 2021-11-19 MED ORDER — MORPHINE SULFATE (PF) 4 MG/ML IV SOLN
4.0000 mg | Freq: Once | INTRAVENOUS | Status: AC
  Administered 2021-11-19: 4 mg via INTRAVENOUS
  Filled 2021-11-19: qty 1

## 2021-11-19 NOTE — Patient Instructions (Signed)

## 2021-11-19 NOTE — Progress Notes (Signed)
I met with [name] in the infusion room while receiving IV Feraheme per nursing report of increasing left leg pain.  He is starting to feel slightly better in general, but still rests often and not doing much activity.  He had this pain from the left deep glute all the way down his leg prior to admission but worsened in the hospital.  He was evaluated by Dr. Hulan Saas prior to the hospital and was prescribed gabapentin which he takes 2 at night.  He reports the pain as a throbbing sensation that worsens with prolonged sitting and walking, rates pain 8/10.  He denies swelling, calf pain, cough, dyspnea.  There is no swelling on exam.  He has a follow-up with Dr. Tamala Julian scheduled on 12/02/2021, I encouraged him to keep that appointment. In the meantime he can increase gabapentin to 3 tabs nightly and add Tylenol PRN.  I recommend to avoid NSAIDs d/t recent GI bleeding.  Given his history of DVT/PE x15 years of anticoagulation recently DC'd with hospitalization for GI bleeding, coupled with immobility lately, I recommend left lower extremity Doppler to rule out DVT, although my suspicion is low.  Worsened pain may be secondary to vascular procedure to cauterize rectal and surrounding arteries.   Will monitor closely. I reviewed return/call precautions for leg swelling, calf pain, cough, dyspnea, bleeding, or other concerning changes.   He understands and agrees with the plan.   Scheduled reviewed, lab 12/21 and lab/follow up 1/4.   Cira Rue, NP

## 2021-11-19 NOTE — Progress Notes (Signed)
While in infusion today Pt informed RN that he has been having leg pain starting in his upper glute that radiates down towards his ankle. This pain started after a procedure on 11/02/21 and is worsening. Pt rated his pain 8/10. Pt informed RN that he takes prescribed PO gabapentin at night, but this does not help with his pain during the day.  RN made MD, NP, and collab RN aware of this. NP came to infusion and assessed pt.

## 2021-11-19 NOTE — ED Triage Notes (Signed)
Patient complains of sudden onset abdomina pain, umbilicus area, denies N/V/D, denies fevers.

## 2021-11-19 NOTE — Progress Notes (Signed)
Pt observed for 30 minutes post Venofer infusion. Pt tolerated treatment well without incident.  VSS at discharge. Ambulatory to lobby.  

## 2021-11-19 NOTE — ED Provider Notes (Signed)
[facility] DEPT Provider Note   CSN: [id number] Arrival date & time: 11/19/21  1906     History Chief Complaint  Patient presents with   Abdominal Pain    [name] is a 65 y.o. male.  Pt presents to the ED today with abdominal pain.  It started about 1 hr pta.  He said it is in the middle of his abdomen.  He was recently admitted for a GI bleed and had embolization of the superior rectal artery and the right internal iliac artery.  Pt had severe anemia, but is a Jehovah's witness and refused blood products.  Pt did receive IV iron today in the cancer center.  No n/v/d or fevers.      Past Medical History:  Diagnosis Date   Arthritis    BACK PAIN    CAD (coronary artery disease) 06/28/2017   CAD in native artery    a. moderate by cardiac CT 2016.   Chronic bilateral low back pain with bilateral sciatica 01/12/2019   Degenerative arthritis of right knee 07/15/2016   Injected in 07/15/2016 Orthovisc injection started 10/07/2016 DepoMedrol 40 inj on 04/11/17, 25 cc aspirated Orthovisc injection in 05/30/2017     DEGENERATIVE JOINT DISEASE, KNEES, BILATERAL    Depression    Diabetes ([place]) 10/01/2014   Diabetes mellitus    type II   DIVERTICULOSIS, COLON    DVT, lower extremity ([place])    right   History of kidney stones    Hyperlipidemia    Hypertension    HYPOGONADISM, MALE    HYPOTENSION, ORTHOSTATIC    Kidney stones    Long term (current) use of anticoagulants 08/17/2017   Long term current use of anticoagulant    Lumbosacral radiculopathy at S1 06/14/2012   Maxillary sinus cyst 12/11/2018   Obesity    OBESITY, MORBID 03/04/2008   Qualifier: Diagnosis of  By: Jenny Reichmann MD, Hunt Oris    OSA (obstructive sleep apnea) 09/11/2013   Pulmonary emboli (HCC)    Pulmonary embolism ([place]) 02/05/2011   RBBB 07/28/2017   Renal insufficiency    a. prior h/o, does not appear chronic.   Superficial phlebitis of arm 03/18/2016   Vitamin D deficiency 07/09/2020     Patient Active Problem List   Diagnosis Date Noted   Prostate CA ([place]) 11/15/2021   Refusal of blood transfusions as patient is Jehovah's Witness 11/04/2021   Abnormal CT of the abdomen    Chest pain 11/03/2021   Hematochezia    GIB (gastrointestinal bleeding) 11/02/2021   COVID-19 virus infection 08/26/2021   CKD (chronic kidney disease) stage 3, GFR 30-59 ml/min (HCC) 03/14/2021   Elevated PSA 03/13/2021   B12 deficiency 03/13/2021   Acute blood loss anemia 12/14/2020   Osteoarthritis of right knee 11/18/2020   S/P right TKA 11/18/2020   Gross hematuria 09/28/2020   Left flank pain 09/23/2020   Dyspepsia 07/09/2020   Left groin pain 07/09/2020   Vitamin D deficiency 07/09/2020   Chronic bilateral low back pain with bilateral sciatica 01/12/2019   Chronic low back pain 01/12/2019   Headache 12/11/2018   AKI (acute kidney injury) ([place]) 12/11/2018   Nosebleed 12/11/2018   Maxillary sinus cyst 12/11/2018   Left leg pain 12/11/2018   Acute tubular injury of transplanted kidney ([place]) 12/11/2018   Lower limb pain, inferior, left 12/11/2018   Left hip pain 12/01/2018   Hip pain 12/01/2018   Acute upper respiratory infection 08/17/2018   Increased prostate specific antigen (  PSA) velocity 07/12/2018   Pain in lower jaw 03/23/2018   Pain of left heel 12/21/2017   RBBB 07/28/2017   CAD (coronary artery disease) 06/28/2017   Coronary arteriosclerosis 06/28/2017   Degenerative arthritis of right knee 07/15/2016   Bursitis of shoulder 07/15/2016   Right shoulder pain 06/24/2016   Right knee pain 06/24/2016   Superficial phlebitis of arm 03/18/2016   Abdominal pain 12/23/2015   Rash 10/29/2015   Eruption cyst 10/29/2015   Chest pain at rest 03/27/2015   Dysuria 03/27/2015   Diabetes ([place]) 10/01/2014   S/P gastric bypass 08/07/2014   Supratherapeutic INR 08/07/2014   No blood products 07/17/2014   Nipple pain 03/13/2014   Breast mass in male 03/13/2014   Pain of breast  03/13/2014   Breast lump 03/13/2014   Encounter for therapeutic drug monitoring 01/25/2014   Esophageal reflux 12/19/2013   Diabetes mellitus type 2 in obese ([place]) 12/19/2013   Peripheral edema 10/10/2013   OSA (obstructive sleep apnea) 09/11/2013   Lesion of penis 01/01/2013   Lumbosacral radiculopathy at S1 06/14/2012   S/P left knee replacement 01/10/2012   Preventative health care 12/16/2011   Back pain with radiation 09/18/2011   Backache with radiation 09/18/2011   Long term current use of anticoagulant 04/08/2011   HYPOTENSION, ORTHOSTATIC 01/29/2010   CONSTIPATION 01/21/2010   DEGENERATIVE JOINT DISEASE, KNEES, BILATERAL 11/17/2009   SMOKER 09/12/2009   HYPOGONADISM, MALE 03/04/2008   OBESITY, MORBID 03/04/2008   Depression 10/05/2007   DIVERTICULOSIS, COLON 10/05/2007   COLONIC POLYPS, HX OF 10/05/2007   Hyperlipidemia 06/30/2007   Essential hypertension 06/30/2007   History of pulmonary embolus (PE) 06/13/2006    Past Surgical History:  Procedure Laterality Date   ankle surgury  2001 bilateral   with bone spurs and torn tendon   COLONOSCOPY  03/18/2010   EDG  2008   chronic Duodenitis   FLEXIBLE SIGMOIDOSCOPY N/A 11/03/2021   Procedure: FLEXIBLE SIGMOIDOSCOPY;  Surgeon: Doran Stabler, MD;  Location: [facility];  Service: Gastroenterology;  Laterality: N/A;   GASTRIC BYPASS     HEEL SPUR SURGERY Bilateral 2005   IR ANGIOGRAM PELVIS SELECTIVE OR SUPRASELECTIVE  11/02/2021   IR ANGIOGRAM PELVIS SELECTIVE OR SUPRASELECTIVE  11/04/2021   IR ANGIOGRAM SELECTIVE EACH ADDITIONAL VESSEL  11/02/2021   IR ANGIOGRAM SELECTIVE EACH ADDITIONAL VESSEL  11/02/2021   IR ANGIOGRAM SELECTIVE EACH ADDITIONAL VESSEL  11/02/2021   IR ANGIOGRAM SELECTIVE EACH ADDITIONAL VESSEL  11/04/2021   IR ANGIOGRAM SELECTIVE EACH ADDITIONAL VESSEL  11/04/2021   IR ANGIOGRAM SELECTIVE EACH ADDITIONAL VESSEL  11/04/2021   IR ANGIOGRAM SELECTIVE EACH ADDITIONAL VESSEL  11/04/2021   IR  ANGIOGRAM VISCERAL SELECTIVE  11/02/2021   IR ANGIOGRAM VISCERAL SELECTIVE  11/04/2021   IR EMBO ART  VEN HEMORR LYMPH EXTRAV  INC GUIDE ROADMAPPING  11/02/2021   IR EMBO ART  VEN HEMORR LYMPH EXTRAV  INC GUIDE ROADMAPPING  11/04/2021   IR EMBO ART  VEN HEMORR LYMPH EXTRAV  INC GUIDE ROADMAPPING  11/04/2021   IR US GUIDE VASC ACCESS RIGHT  11/02/2021   IR US GUIDE [place] RIGHT  11/04/2021   knee surgury  2000 & 2003    cartilage damage   LEFT HEART CATH AND CORONARY ANGIOGRAPHY N/A 02/16/2021   Procedure: LEFT HEART CATH AND CORONARY ANGIOGRAPHY;  Surgeon: Sherren Mocha, MD;  Location: [facility];  Service: Cardiovascular;  Laterality: N/A;   LUMBAR LAMINECTOMY/DECOMPRESSION MICRODISCECTOMY  09/28/2012   Procedure: LUMBAR LAMINECTOMY/DECOMPRESSION  MICRODISCECTOMY 2 LEVELS;  Surgeon: [name], MD;  Location: [facility];  Service: Orthopedics;  Laterality: Left;  Decompressive laminectomy L4-L5. Microdiscectomy L5-S1   Skin removal surgery     extra abdominal skin removed from his weight loss   TONSILLECTOMY AND ADENOIDECTOMY  age 89 or 6   TOTAL KNEE ARTHROPLASTY  01/10/2012   Procedure: TOTAL KNEE ARTHROPLASTY;  Surgeon: [name], MD;  Location: [facility];  Service: Orthopedics;  Laterality: Left;   TOTAL KNEE ARTHROPLASTY Right 11/18/2020   Procedure: TOTAL KNEE ARTHROPLASTY;  Surgeon: Paralee Cancel, MD;  Location: [facility];  Service: Orthopedics;  Laterality: Right;  70 mins   UPPER GASTROINTESTINAL ENDOSCOPY  03/18/2010       Family History  Problem Relation Age of Onset   Hypertension Mother    Prostate cancer Father    Diabetes Sister        died with DM 03-15-2001, 2 more sisters with diabetes   Heart failure Sister    Diabetes Brother    Diabetes Sister    Diabetes Sister    Colon cancer Neg Hx    Colon polyps Neg Hx    Esophageal cancer Neg Hx    Rectal cancer Neg Hx    Stomach cancer Neg Hx     Social History   Tobacco Use   Smoking status: Former     Packs/day: 0.25    Years: 5.00    Pack years: 1.25    Types: Cigarettes    Quit date: 12/06/1978    Years since quitting: 42.9   Smokeless tobacco: Never  Vaping Use   Vaping Use: Never used  Substance Use Topics   Alcohol use: Yes    Alcohol/week: 2.0 standard drinks    Types: 2 Standard drinks or equivalent per week   Drug use: No    Home Medications Prior to Admission medications   Medication Sig Start Date End Date Taking? Authorizing Provider  Artificial Tear Ointment (ARTIFICIAL TEARS) ointment Place 1 drop into both eyes every 4 (four) hours as needed (dry eyes).     [provider]  atorvastatin (LIPITOR) 80 MG tablet Take 1 tablet (80 mg total) by mouth daily. 06/12/21   Biagio Borg, MD  Cholecalciferol (VITAMIN D3) 125 MCG (5000 UT) CAPS Take 5,000 Units by mouth daily.     [provider]  ferrous sulfate 325 (65 FE) MG EC tablet Take 1 tablet (325 mg total) by mouth daily. 11/11/21   Alla Feeling, NP  folic acid (FOLVITE) 1 MG tablet Take 1 tablet (1 mg total) by mouth daily. 11/11/21   Alla Feeling, NP  gabapentin (NEURONTIN) 100 MG capsule Take 2 capsules (200 mg total) by mouth at bedtime. 10/21/21   Lyndal Pulley, DO  glipiZIDE (GLUCOTROL XL) 2.5 MG 24 hr tablet TAKE 1 TABLET (2.5 MG TOTAL) BY MOUTH DAILY WITH BREAKFAST. 04/28/21   Biagio Borg, MD  metFORMIN (GLUCOPHAGE) 500 MG tablet TAKE 4 TABLETS BY MOUTH DAILY WITH BREAKFAST Patient taking differently: Take 2,000 mg by mouth daily with breakfast. 06/02/21   Biagio Borg, MD  Multiple Vitamin (MULTIVITAMIN WITH MINERALS) TABS tablet Take 1 tablet by mouth daily at 12 noon.    [provider]  nitroGLYCERIN (NITROSTAT) 0.4 MG SL tablet Place 1 tablet (0.4 mg total) under the tongue every 5 (five) minutes as needed for chest pain. 07/30/15   Dorothy Spark, MD  pantoprazole (PROTONIX) 40 MG tablet Take 1  tablet (40 mg total) by mouth 2 (two) times daily before a meal. 11/13/21   Biagio Borg, MD  vitamin B-12 1000 MCG tablet Take 1 tablet (1,000 mcg total) by mouth daily. 11/08/21   Hosie Poisson, MD    Allergies    Adhesive [tape] and Other  Review of Systems   Review of Systems  Gastrointestinal:  Positive for abdominal pain.  All other systems reviewed and are negative.  Physical Exam Updated Vital Signs BP 138/83 (BP Location: Left Arm)    Pulse (!) 51    Temp 98 F (36.7 C) (Oral)    Resp 18    Ht 6\' 2"  (1.88 m)    Wt 114 kg    SpO2 99%    BMI 32.27 kg/m   Physical Exam Vitals and nursing note reviewed.  Constitutional:      Appearance: He is well-developed.  HENT:     Head: Normocephalic and atraumatic.     Mouth/Throat:     Mouth: Mucous membranes are moist.     Pharynx: Oropharynx is clear.  Eyes:     Extraocular Movements: Extraocular movements intact.     Pupils: Pupils are equal, round, and reactive to light.  Cardiovascular:     Rate and Rhythm: Normal rate and regular rhythm.     Heart sounds: Normal heart sounds.  Pulmonary:     Effort: Pulmonary effort is normal.     Breath sounds: Normal breath sounds.  Abdominal:     General: Abdomen is flat. Bowel sounds are normal.     Palpations: Abdomen is soft.     Tenderness: There is generalized abdominal tenderness and tenderness in the periumbilical area.  Skin:    General: Skin is warm.     Capillary Refill: Capillary refill takes less than 2 seconds.  Neurological:     General: No focal deficit present.     Mental Status: He is alert and oriented to person, place, and time.  Psychiatric:        Mood and Affect: Mood normal.        Behavior: Behavior normal.    ED Results / Procedures / Treatments   Labs (all labs ordered are listed, but only abnormal results are displayed) Labs Reviewed  CBC WITH DIFFERENTIAL/PLATELET - Abnormal; Notable for the following components:      Result Value   RBC 3.19 (*)    Hemoglobin 7.5 (*)    HCT 26.1 (*)    MCH 23.5 (*)    MCHC 28.7 (*)     RDW 21.7 (*)    All other components within normal limits  COMPREHENSIVE METABOLIC PANEL - Abnormal; Notable for the following components:   Glucose, Bld 130 (*)    Creatinine, Ser 1.32 (*)    Calcium 8.7 (*)    Total Protein 5.9 (*)    Albumin 3.4 (*)    GFR, Estimated 59 (*)    All other components within normal limits  URINALYSIS, ROUTINE W REFLEX MICROSCOPIC - Abnormal; Notable for the following components:   Hgb urine dipstick LARGE (*)    All other components within normal limits  LIPASE, BLOOD    EKG None  Radiology CT ABDOMEN PELVIS W CONTRAST  Result Date: 11/19/2021 CLINICAL DATA:  Acute abdominal pain EXAM: CT ABDOMEN AND PELVIS WITH CONTRAST TECHNIQUE: Multidetector CT imaging of the abdomen and pelvis was performed using the standard protocol following bolus administration of intravenous contrast. CONTRAST:  45mL OMNIPAQUE IOHEXOL 350 MG/ML  SOLN COMPARISON:  11/02/2021 FINDINGS: Lower chest: No acute pleural or parenchymal lung disease. Hepatobiliary: Small calcified gallstones without evidence of cholecystitis. The liver is grossly unremarkable. Pancreas: Unremarkable. No pancreatic ductal dilatation or surrounding inflammatory changes. Spleen: Normal in size without focal abnormality. Adrenals/Urinary Tract: Adrenal glands are unremarkable. Kidneys are normal, without renal calculi, focal lesion, or hydronephrosis. Bladder is decompressed, limiting its evaluation. Stomach/Bowel: No bowel obstruction or ileus. Postsurgical changes are seen from prior bariatric surgery. Scattered diverticulosis of the descending colon without diverticulitis. Mild rectal wall thickening and perirectal fat stranding could reflect sequela of recent rectal artery embolization procedure. Vascular/Lymphatic: Aortic atherosclerosis. No enlarged abdominal or pelvic lymph nodes. Reproductive: Prostate is unremarkable. Other: No free fluid or free gas.  No abdominal wall hernia. Musculoskeletal: No acute or  destructive bony lesions. Reconstructed images demonstrate no additional findings. IMPRESSION: 1. Mild rectal wall thickening and perirectal fat stranding, compatible with proctitis. It is unclear whether this is related in any way to the recent rectal artery embolization procedure performed 11/04/2021. 2. Cholelithiasis without cholecystitis. 3. Minimal descending colonic diverticulosis without diverticulitis. 4.  Aortic Atherosclerosis (ICD10-I70.0). Electronically Signed   By: Randa Ngo M.D.   On: 11/19/2021 21:47    Procedures Procedures   Medications Ordered in ED Medications  morphine 4 MG/ML injection 4 mg (4 mg Intravenous Given 11/19/21 2035)  sodium chloride 0.9 % bolus 1,000 mL (0 mLs Intravenous Stopped 11/19/21 2150)  ondansetron (ZOFRAN) injection 4 mg (4 mg Intravenous Given 11/19/21 2035)  iohexol (OMNIPAQUE) 350 MG/ML injection 80 mL (80 mLs Intravenous Contrast Given 11/19/21 2126)    ED Course  I have reviewed the triage vital signs and the nursing notes.  Pertinent labs & imaging results that were available during my care of the patient were reviewed by me and considered in my medical decision making (see chart for details).    MDM Rules/Calculators/A&P                         Pt is feeling much better.  Hgb was 8.3 this am and 7.5 now.  He does have some hematuria.  Pt has an appt with urology in a few days. Pt has hx of prostate cancer and is s/p transrectal prostate biopsy.  No treatment yet for the prostate cancer. CT scan shows some mild rectal wall thickening.  Pt has no pain in his rectum, so I think it is probably due to the recent embolization.  Pt is stable for d/c.  Return if worse.     Final Clinical Impression(s) / ED Diagnoses Final diagnoses:  Epigastric pain  Hematuria, unspecified type  Anemia, unspecified type    Rx / DC Orders ED Discharge Orders     None        Isla Pence, MD 11/19/21 2220

## 2021-11-20 ENCOUNTER — Ambulatory Visit (HOSPITAL_COMMUNITY)
Admission: RE | Admit: 2021-11-20 | Discharge: 2021-11-20 | Disposition: A | Discharge status: Home / Self Care | Payer: HMO | Source: Ambulatory Visit | Attending: Nurse Practitioner | Admitting: Nurse Practitioner

## 2021-11-20 ENCOUNTER — Telehealth: Payer: Self-pay | Admitting: Internal Medicine

## 2021-11-20 DIAGNOSIS — R269 Unspecified abnormalities of gait and mobility: Secondary | ICD-10-CM

## 2021-11-20 DIAGNOSIS — M79605 Pain in left leg: Secondary | ICD-10-CM | POA: Insufficient documentation

## 2021-11-20 NOTE — Telephone Encounter (Signed)
Chasity w/ [facility] [employer] states the patient requested a shower chair to the hospital MD when admitted, but has not received the chair  Caller stated patient is requesting provider to put in an order for a shower chair  Please advise

## 2021-11-20 NOTE — Telephone Encounter (Signed)
Done hardcopy to staff 

## 2021-11-20 NOTE — Telephone Encounter (Signed)
Connected to [employer] 12.15.2022.   Caller states he is having severe stomach pain. Got out of hospital last week. Pains started an hour ago.   Caller states he is having severe stomach pains around naval 10/10 pain. Recently seen at Dr.'s last week for biopsy  Advised to go to ED

## 2021-11-20 NOTE — Progress Notes (Signed)
Left lower extremity venous duplex has been completed. Preliminary results can be found in [facility] Proc through chart review.  Results were given to Cira Rue NP.  11/20/21 9:12 AM [name] RVT

## 2021-11-23 ENCOUNTER — Telehealth: Payer: Self-pay | Admitting: Family Medicine

## 2021-11-23 NOTE — Telephone Encounter (Signed)
Reviewed patient's oncology notes as well as the GI notes and recent hospitalization.  Patient did have what appeared to be more of a GI bleed with a hemoglobin of 5.  Seems to have stabilized at 7.5.  Still feel that likely some of his pain in the back is secondary to more of his abdominal pain and postprocedure pain and anemia.  Do not think that would change management at this point.  If worsening pain does need to seek medical attention at urgent care or emergency room.

## 2021-11-23 NOTE — Telephone Encounter (Signed)
Spoke with patient per recommendation. Patient voices understanding.

## 2021-11-23 NOTE — Telephone Encounter (Signed)
Pt wife called, concerned with lack of improvement in his pain. She thinks pt is taking 3 gabapentins at nighttime but not 100% sure.  Pt was recently hospitalized and had a few procedures , this may have impacted his pain.   Wife looking for help to ease his pain.

## 2021-11-23 NOTE — Telephone Encounter (Signed)
Order has been faxed to [facility]

## 2021-11-25 ENCOUNTER — Inpatient Hospital Stay: Payer: HMO

## 2021-11-25 ENCOUNTER — Other Ambulatory Visit: Payer: Self-pay

## 2021-11-25 DIAGNOSIS — D62 Acute posthemorrhagic anemia: Secondary | ICD-10-CM

## 2021-11-25 DIAGNOSIS — C61 Malignant neoplasm of prostate: Secondary | ICD-10-CM | POA: Diagnosis not present

## 2021-11-25 LAB — IRON AND IRON BINDING CAPACITY (CC-WL,HP ONLY)
Iron: 72 ug/dL (ref 45–182)
Saturation Ratios: 28 % (ref 17.9–39.5)
TIBC: 253 ug/dL (ref 250–450)
UIBC: 181 ug/dL (ref 117–376)

## 2021-11-25 LAB — CBC WITH DIFFERENTIAL (CANCER CENTER ONLY)
Abs Immature Granulocytes: 0.01 10*3/uL (ref 0.00–0.07)
Basophils Absolute: 0 10*3/uL (ref 0.0–0.1)
Basophils Relative: 0 %
Eosinophils Absolute: 0.1 10*3/uL (ref 0.0–0.5)
Eosinophils Relative: 2 %
HCT: 28.9 % — ABNORMAL LOW (ref 39.0–52.0)
Hemoglobin: 8.5 g/dL — ABNORMAL LOW (ref 13.0–17.0)
Immature Granulocytes: 0 %
Lymphocytes Relative: 25 %
Lymphs Abs: 1.5 10*3/uL (ref 0.7–4.0)
MCH: 23.5 pg — ABNORMAL LOW (ref 26.0–34.0)
MCHC: 29.4 g/dL — ABNORMAL LOW (ref 30.0–36.0)
MCV: 80.1 fL (ref 80.0–100.0)
Monocytes Absolute: 0.5 10*3/uL (ref 0.1–1.0)
Monocytes Relative: 8 %
Neutro Abs: 3.7 10*3/uL (ref 1.7–7.7)
Neutrophils Relative %: 65 %
Platelet Count: 267 10*3/uL (ref 150–400)
RBC: 3.61 MIL/uL — ABNORMAL LOW (ref 4.22–5.81)
RDW: 19.9 % — ABNORMAL HIGH (ref 11.5–15.5)
WBC Count: 5.8 10*3/uL (ref 4.0–10.5)
nRBC: 0 % (ref 0.0–0.2)

## 2021-11-25 LAB — FERRITIN: Ferritin: 258 ng/mL (ref 24–336)

## 2021-11-28 ENCOUNTER — Other Ambulatory Visit: Payer: Self-pay | Admitting: Nurse Practitioner

## 2021-11-28 ENCOUNTER — Other Ambulatory Visit: Payer: Self-pay | Admitting: Family Medicine

## 2021-11-28 ENCOUNTER — Other Ambulatory Visit: Payer: Self-pay | Admitting: Internal Medicine

## 2021-11-28 DIAGNOSIS — Z7901 Long term (current) use of anticoagulants: Secondary | ICD-10-CM

## 2021-11-28 DIAGNOSIS — E538 Deficiency of other specified B group vitamins: Secondary | ICD-10-CM

## 2021-12-01 NOTE — Progress Notes (Signed)
[place] Phone: [phone] Subjective:   [name], am serving as a scribe for Dr. Hulan Saas. This visit occurred during the SARS-CoV-2 public health emergency.  Safety protocols were in place, including screening questions prior to the visit, additional usage of staff PPE, and extensive cleaning of exam room while observing appropriate contact time as indicated for disinfecting solutions.  I'm seeing this patient by the request  of:  Biagio Borg, MD  CC: Low back pain and leg pain  [id number]  10/21/2021 Patient does have signs and symptoms that is consistent with potentially recurrent lumbosacral radicular symptoms.  Does correspond with an S1 impingement.  Differential does include the potential hamstring pathology as well.  Patient is tender to palpation in the piriformis area as well.  At this point would like to start on gabapentin and see how patient responds.  We will get x-rays on another date secondary to our machines being down the patient is doing worked up for elevated PSA and is having a biopsy in the near future.  Patient will follow up with me again in 6 to 8 weeks.  Update 12/02/2021 [name] is a 65 y.o. male coming in with complaint of lumbar spine pain. Patient states that his back pain has not improved.  Patient was hospitalized and did review records.  Patient did have difficulty with bleeding  Pain continues to radiate down the L leg into the hamstring and into the calf. Using gabapentin.  Patient states that the pain is unrelenting, unable to do any activity.  Worse with laying down flat at the moment.  Due to all of patient's other health issues patient has not been able to do the exercises regularly.  Feels like the pain is worsening over the course of time.      Past Medical History:  Diagnosis Date   Arthritis    BACK PAIN    CAD (coronary artery disease) 06/28/2017    CAD in native artery    a. moderate by cardiac CT 2016.   Chronic bilateral low back pain with bilateral sciatica 01/12/2019   Degenerative arthritis of right knee 07/15/2016   Injected in 07/15/2016 Orthovisc injection started 10/07/2016 DepoMedrol 40 inj on 04/11/17, 25 cc aspirated Orthovisc injection in 05/30/2017     DEGENERATIVE JOINT DISEASE, KNEES, BILATERAL    Depression    Diabetes ([place]) 10/01/2014   Diabetes mellitus    type II   DIVERTICULOSIS, COLON    DVT, lower extremity ([place])    right   History of kidney stones    Hyperlipidemia    Hypertension    HYPOGONADISM, MALE    HYPOTENSION, ORTHOSTATIC    Kidney stones    Long term (current) use of anticoagulants 08/17/2017   Long term current use of anticoagulant    Lumbosacral radiculopathy at S1 06/14/2012   Maxillary sinus cyst 12/11/2018   Obesity    OBESITY, MORBID 03/04/2008   Qualifier: Diagnosis of  By: Jenny Reichmann MD, Hunt Oris    OSA (obstructive sleep apnea) 09/11/2013   Pulmonary emboli ([facility])    Pulmonary embolism ([place]) 02/05/2011   RBBB 07/28/2017   Renal insufficiency    a. prior h/o, does not appear chronic.   Superficial phlebitis of arm 03/18/2016   Vitamin D deficiency 07/09/2020   Past Surgical History:  Procedure Laterality Date   ankle surgury  2001 bilateral   with bone spurs and torn tendon  COLONOSCOPY  03/18/2010   EDG  2008   chronic Duodenitis   FLEXIBLE SIGMOIDOSCOPY N/A 11/03/2021   Procedure: FLEXIBLE SIGMOIDOSCOPY;  Surgeon: Doran Stabler, MD;  Location: [facility];  Service: Gastroenterology;  Laterality: N/A;   GASTRIC BYPASS     HEEL SPUR SURGERY Bilateral 2005   IR ANGIOGRAM PELVIS SELECTIVE OR SUPRASELECTIVE  11/02/2021   IR ANGIOGRAM PELVIS SELECTIVE OR SUPRASELECTIVE  11/04/2021   IR ANGIOGRAM SELECTIVE EACH ADDITIONAL VESSEL  11/02/2021   IR ANGIOGRAM SELECTIVE EACH ADDITIONAL VESSEL  11/02/2021   IR ANGIOGRAM SELECTIVE EACH ADDITIONAL VESSEL  11/02/2021   IR ANGIOGRAM SELECTIVE  EACH ADDITIONAL VESSEL  11/04/2021   IR ANGIOGRAM SELECTIVE EACH ADDITIONAL VESSEL  11/04/2021   IR ANGIOGRAM SELECTIVE EACH ADDITIONAL VESSEL  11/04/2021   IR ANGIOGRAM SELECTIVE EACH ADDITIONAL VESSEL  11/04/2021   IR ANGIOGRAM VISCERAL SELECTIVE  11/02/2021   IR ANGIOGRAM VISCERAL SELECTIVE  11/04/2021   IR EMBO ART  VEN HEMORR LYMPH EXTRAV  INC GUIDE ROADMAPPING  11/02/2021   IR EMBO ART  VEN HEMORR LYMPH EXTRAV  INC GUIDE ROADMAPPING  11/04/2021   IR EMBO ART  VEN HEMORR LYMPH EXTRAV  INC GUIDE ROADMAPPING  11/04/2021   IR US GUIDE VASC ACCESS RIGHT  11/02/2021   IR US GUIDE [place] RIGHT  11/04/2021   knee surgury  2000 & 2003    cartilage damage   LEFT HEART CATH AND CORONARY ANGIOGRAPHY N/A 02/16/2021   Procedure: LEFT HEART CATH AND CORONARY ANGIOGRAPHY;  Surgeon: Sherren Mocha, MD;  Location: [facility];  Service: Cardiovascular;  Laterality: N/A;   LUMBAR LAMINECTOMY/DECOMPRESSION MICRODISCECTOMY  09/28/2012   Procedure: LUMBAR LAMINECTOMY/DECOMPRESSION MICRODISCECTOMY 2 LEVELS;  Surgeon: [name], MD;  Location: [facility];  Service: Orthopedics;  Laterality: Left;  Decompressive laminectomy L4-L5. Microdiscectomy L5-S1   Skin removal surgery     extra abdominal skin removed from his weight loss   TONSILLECTOMY AND ADENOIDECTOMY  age 55 or 6   TOTAL KNEE ARTHROPLASTY  01/10/2012   Procedure: TOTAL KNEE ARTHROPLASTY;  Surgeon: [name], MD;  Location: [facility];  Service: Orthopedics;  Laterality: Left;   TOTAL KNEE ARTHROPLASTY Right 11/18/2020   Procedure: TOTAL KNEE ARTHROPLASTY;  Surgeon: Paralee Cancel, MD;  Location: [facility];  Service: Orthopedics;  Laterality: Right;  70 mins   UPPER GASTROINTESTINAL ENDOSCOPY  03/18/2010   Social History   Socioeconomic History   Marital status: Married    Spouse name: Not on file   Number of children: 2   Years of education: Not on file   Highest education level: High school graduate  Occupational History    Occupation: Retired    Fish farm manager: Banker  Tobacco Use   Smoking status: Former    Packs/day: 0.25    Years: 5.00    Pack years: 1.25    Types: Cigarettes    Quit date: 12/06/1978    Years since quitting: 43.0   Smokeless tobacco: Never  Vaping Use   Vaping Use: Never used  Substance and Sexual Activity   Alcohol use: Yes    Alcohol/week: 2.0 standard drinks    Types: 2 Standard drinks or equivalent per week   Drug use: [name]   Sexual activity: Not Currently  Other Topics Concern   Not on file  Social History Narrative   Daily caffeine use one per day   Protein drinks    Children; a friend and sister that helps   Brother that is around (  Legrand [place]o)   Son and dtr in [place]   Lives w/ wife in [place]   Left handed     Retired from [employer]   Former smoker 2 alcoholic beverages daily [name] caffeine [name] drug use [name] other tobacco at this time   Social Determinants of Radio broadcast assistant Strain: Not on file  Food Insecurity: Not on file  Transportation Needs: Not on file  Physical Activity: Not on file  Stress: Not on file  Social Connections: Not on file   Allergies  Allergen Reactions   Adhesive [Tape] Other (See Comments)    Painful, Please use "paper" tape   Other     [name] BLOOD -JEHOVAH'S WITNESS-SIGNED REFUSAL BUT WOULD TAKE ALBUMIN   Family History  Problem Relation Age of Onset   Hypertension Mother    Prostate cancer Father    Diabetes Sister        died with DM Mar 01, 2001, 2 more sisters with diabetes   Heart failure Sister    Diabetes Brother    Diabetes Sister    Diabetes Sister    Colon cancer Neg Hx    Colon polyps Neg Hx    Esophageal cancer Neg Hx    Rectal cancer Neg Hx    Stomach cancer Neg Hx     Current Outpatient Medications (Endocrine & Metabolic):    glipiZIDE (GLUCOTROL XL) 2.5 MG 24 hr tablet, TAKE 1 TABLET BY MOUTH DAILY WITH BREAKFAST.   metFORMIN (GLUCOPHAGE) 500 MG tablet, TAKE 4 TABLETS BY MOUTH DAILY WITH BREAKFAST.  Current  Outpatient Medications (Cardiovascular):    atorvastatin (LIPITOR) 80 MG tablet, Take 1 tablet (80 mg total) by mouth daily.   nitroGLYCERIN (NITROSTAT) 0.4 MG SL tablet, Place 1 tablet (0.4 mg total) under the tongue every 5 (five) minutes as needed for chest pain.    Current Outpatient Medications (Hematological):    vitamin B-12 1000 MCG tablet, Take 1 tablet (1,000 mcg total) by mouth daily.   ferrous sulfate 325 (65 FE) MG EC tablet, TAKE 1 TABLET BY MOUTH EVERY DAY   folic acid (FOLVITE) 1 MG tablet, TAKE 1 TABLET BY MOUTH EVERY DAY  Current Outpatient Medications (Other):    Artificial Tear Ointment (ARTIFICIAL TEARS) ointment, Place 1 drop into both eyes every 4 (four) hours as needed (dry eyes).    Cholecalciferol (VITAMIN D3) 125 MCG (5000 UT) CAPS, Take 5,000 Units by mouth daily.    diazepam (VALIUM) 5 MG tablet, One tab by mouth, 2 hours before procedure.   gabapentin (NEURONTIN) 100 MG capsule, TAKE 2 CAPSULES BY MOUTH AT BEDTIME.   Multiple Vitamin (MULTIVITAMIN WITH MINERALS) TABS tablet, Take 1 tablet by mouth daily at 12 noon.   pantoprazole (PROTONIX) 40 MG tablet, Take 1 tablet (40 mg total) by mouth 2 (two) times daily before a meal.   Reviewed prior external information including notes and imaging from  primary care provider As well as notes that were available from care everywhere and other healthcare systems.  Past medical history, social, surgical and family history all reviewed in electronic medical record.  [name] pertanent information unless stated regarding to the chief complaint.   Review of Systems:  [name] headache, visual changes, nausea, vomiting, diarrhea, constipation, dizziness, abdominal pain, skin rash, fevers, chills, night sweats, weight loss, swollen lymph nodes, chest pain, shortness of breath, mood changes. POSITIVE muscle aches, body aches, joint pain  Objective  Blood pressure 126/74, pulse (!) 58, height 6\' 2"  (1.88 m), weight 258 lb (117  kg), SpO2  99 %.   General: [name] apparent distress alert and oriented x3 mood and affect normal, dressed appropriately.  HEENT: Pupils equal, extraocular movements intact  Respiratory: Patient's speak in full sentences and does not appear short of breath  Cardiovascular: [name] lower extremity edema, non tender, [name] erythema  Gait normal with good balance and coordination.  MSK: Low back exam does have loss of lordosis.  Positive straight leg test on the left side in the S1 distribution of radicular symptoms.  Patient does indicate some mild weakness with foot drop noted.  Severely tender to palpation in the paraspinal musculature of the lumbar spine left greater than right.   Impression and Recommendations:     The above documentation has been reviewed and is accurate and complete Lyndal Pulley, DO

## 2021-12-02 ENCOUNTER — Other Ambulatory Visit: Payer: Self-pay

## 2021-12-02 ENCOUNTER — Inpatient Hospital Stay: Payer: HMO

## 2021-12-02 ENCOUNTER — Encounter: Payer: Self-pay | Admitting: Family Medicine

## 2021-12-02 ENCOUNTER — Ambulatory Visit (INDEPENDENT_AMBULATORY_CARE_PROVIDER_SITE_OTHER): Payer: HMO | Admitting: Family Medicine

## 2021-12-02 VITALS — BP 126/74 | HR 58 | Ht 74.0 in | Wt 258.0 lb

## 2021-12-02 DIAGNOSIS — M25552 Pain in left hip: Secondary | ICD-10-CM | POA: Diagnosis not present

## 2021-12-02 DIAGNOSIS — D62 Acute posthemorrhagic anemia: Secondary | ICD-10-CM

## 2021-12-02 DIAGNOSIS — M25551 Pain in right hip: Secondary | ICD-10-CM

## 2021-12-02 DIAGNOSIS — M5417 Radiculopathy, lumbosacral region: Secondary | ICD-10-CM | POA: Diagnosis not present

## 2021-12-02 DIAGNOSIS — C61 Malignant neoplasm of prostate: Secondary | ICD-10-CM | POA: Diagnosis not present

## 2021-12-02 DIAGNOSIS — M545 Low back pain, unspecified: Secondary | ICD-10-CM | POA: Diagnosis not present

## 2021-12-02 LAB — CBC WITH DIFFERENTIAL (CANCER CENTER ONLY)
Abs Immature Granulocytes: 0.02 10*3/uL (ref 0.00–0.07)
Basophils Absolute: 0 10*3/uL (ref 0.0–0.1)
Basophils Relative: 0 %
Eosinophils Absolute: 0.2 10*3/uL (ref 0.0–0.5)
Eosinophils Relative: 3 %
HCT: 30.6 % — ABNORMAL LOW (ref 39.0–52.0)
Hemoglobin: 9.2 g/dL — ABNORMAL LOW (ref 13.0–17.0)
Immature Granulocytes: 0 %
Lymphocytes Relative: 28 %
Lymphs Abs: 1.9 10*3/uL (ref 0.7–4.0)
MCH: 23.7 pg — ABNORMAL LOW (ref 26.0–34.0)
MCHC: 30.1 g/dL (ref 30.0–36.0)
MCV: 78.7 fL — ABNORMAL LOW (ref 80.0–100.0)
Monocytes Absolute: 0.5 10*3/uL (ref 0.1–1.0)
Monocytes Relative: 7 %
Neutro Abs: 4.1 10*3/uL (ref 1.7–7.7)
Neutrophils Relative %: 62 %
Platelet Count: 194 10*3/uL (ref 150–400)
RBC: 3.89 MIL/uL — ABNORMAL LOW (ref 4.22–5.81)
RDW: 17.9 % — ABNORMAL HIGH (ref 11.5–15.5)
WBC Count: 6.7 10*3/uL (ref 4.0–10.5)
nRBC: 0 % (ref 0.0–0.2)

## 2021-12-02 LAB — FERRITIN: Ferritin: 140 ng/mL (ref 24–336)

## 2021-12-02 MED ORDER — DIAZEPAM 5 MG PO TABS
ORAL_TABLET | ORAL | 0 refills | Status: DC
Start: 2021-12-02 — End: 2022-10-14

## 2021-12-02 NOTE — Assessment & Plan Note (Signed)
Discussed with patient as well as his wife today.  Patient continues to have recurrent lumbosacral radicular symptoms.  Patient recently was hospitalized for more of the vascularity that he was having difficulty and bleeding.  Patient's hemoglobin is seeming to stabilize some at this moment.  Most recent hemoglobin was 9.5 which is up from 8.  Patient will continue to have this checked by primary care provider.  At this moment though with patient continuing to have radicular symptoms, worsening back pain as well as still having work-up for potential prostate cancer I do feel an MRI is necessary at this time.  Patient does even have some weakness noted of the left leg compared to previous exam.  Depending on MRI results of the lumbar and pelvis without contrast we will see how patient would then adjust care accordingly.

## 2021-12-02 NOTE — Addendum Note (Signed)
Addended by: Carmie Kanner on: 12/02/2021 01:27 PM   Modules accepted: Orders

## 2021-12-02 NOTE — Addendum Note (Signed)
Addended by: Douglass Rivers T on: 12/02/2021 03:25 PM   Modules accepted: Orders

## 2021-12-02 NOTE — Patient Instructions (Signed)
MRI lumbar and pelvis [phone] We will be in contact with him

## 2021-12-03 ENCOUNTER — Ambulatory Visit
Admission: RE | Admit: 2021-12-03 | Discharge: 2021-12-03 | Disposition: A | Discharge status: Home / Self Care | Payer: HMO | Source: Ambulatory Visit | Attending: Family Medicine | Admitting: Family Medicine

## 2021-12-03 DIAGNOSIS — M5127 Other intervertebral disc displacement, lumbosacral region: Secondary | ICD-10-CM | POA: Diagnosis not present

## 2021-12-03 DIAGNOSIS — M24151 Other articular cartilage disorders, right hip: Secondary | ICD-10-CM | POA: Diagnosis not present

## 2021-12-03 DIAGNOSIS — M5137 Other intervertebral disc degeneration, lumbosacral region: Secondary | ICD-10-CM | POA: Diagnosis not present

## 2021-12-03 DIAGNOSIS — M48061 Spinal stenosis, lumbar region without neurogenic claudication: Secondary | ICD-10-CM | POA: Diagnosis not present

## 2021-12-03 DIAGNOSIS — M5136 Other intervertebral disc degeneration, lumbar region: Secondary | ICD-10-CM | POA: Diagnosis not present

## 2021-12-03 DIAGNOSIS — M25552 Pain in left hip: Secondary | ICD-10-CM

## 2021-12-03 DIAGNOSIS — M4316 Spondylolisthesis, lumbar region: Secondary | ICD-10-CM | POA: Diagnosis not present

## 2021-12-03 DIAGNOSIS — M545 Low back pain, unspecified: Secondary | ICD-10-CM

## 2021-12-04 ENCOUNTER — Encounter (HOSPITAL_BASED_OUTPATIENT_CLINIC_OR_DEPARTMENT_OTHER): Payer: Self-pay

## 2021-12-04 ENCOUNTER — Telehealth: Payer: Self-pay | Admitting: *Deleted

## 2021-12-04 ENCOUNTER — Other Ambulatory Visit: Payer: Self-pay

## 2021-12-04 DIAGNOSIS — M5416 Radiculopathy, lumbar region: Secondary | ICD-10-CM

## 2021-12-04 NOTE — Telephone Encounter (Signed)
Per Cira Rue, NP, called pt with message below. Advised to call office with any concerns or if he's experiencing and bleeding issues. Pt verbalized understanding.

## 2021-12-04 NOTE — Telephone Encounter (Signed)
-----   Message from Alla Feeling, NP sent at 12/02/2021 11:51 AM EST ----- Please let pt know his ferritin is normal and anemia is improving. We will continue monitoring. Next lab and f/up 1/5. Let us know if he experiences bleeding between now and then.  Thanks, Regan Rakers, NP

## 2021-12-05 ENCOUNTER — Other Ambulatory Visit: Payer: Self-pay | Admitting: Internal Medicine

## 2021-12-05 ENCOUNTER — Other Ambulatory Visit: Payer: Self-pay | Admitting: Family Medicine

## 2021-12-05 NOTE — Telephone Encounter (Signed)
Please refill as per office routine med refill policy (all routine meds to be refilled for 3 mo or monthly (per pt preference) up to one year from last visit, then month to month grace period for 3 mo, then further med refills will have to be denied) ? ?

## 2021-12-08 ENCOUNTER — Encounter: Payer: Self-pay | Admitting: Nurse Practitioner

## 2021-12-08 ENCOUNTER — Ambulatory Visit
Admission: RE | Admit: 2021-12-08 | Discharge: 2021-12-08 | Disposition: A | Discharge status: Home / Self Care | Payer: HMO | Source: Ambulatory Visit | Attending: Family Medicine | Admitting: Family Medicine

## 2021-12-08 ENCOUNTER — Other Ambulatory Visit: Payer: Self-pay

## 2021-12-08 DIAGNOSIS — M47817 Spondylosis without myelopathy or radiculopathy, lumbosacral region: Secondary | ICD-10-CM | POA: Diagnosis not present

## 2021-12-08 DIAGNOSIS — M5116 Intervertebral disc disorders with radiculopathy, lumbar region: Secondary | ICD-10-CM | POA: Diagnosis not present

## 2021-12-08 DIAGNOSIS — M5416 Radiculopathy, lumbar region: Secondary | ICD-10-CM

## 2021-12-08 MED ORDER — IOPAMIDOL (ISOVUE-M 200) INJECTION 41%
1.0000 mL | Freq: Once | INTRAMUSCULAR | Status: AC
  Administered 2021-12-08: 1 mL via EPIDURAL

## 2021-12-08 MED ORDER — METHYLPREDNISOLONE ACETATE 40 MG/ML INJ SUSP (RADIOLOG
80.0000 mg | Freq: Once | INTRAMUSCULAR | Status: AC
  Administered 2021-12-08: 80 mg via EPIDURAL

## 2021-12-08 NOTE — Discharge Instructions (Signed)

## 2021-12-09 ENCOUNTER — Telehealth: Payer: Self-pay | Admitting: Hematology

## 2021-12-09 NOTE — Telephone Encounter (Signed)
Rescheduled tomorrow's appointment per 1/3 schedule message. Patient is aware of changes.

## 2021-12-10 ENCOUNTER — Ambulatory Visit: Payer: HMO | Admitting: Hematology

## 2021-12-10 ENCOUNTER — Other Ambulatory Visit: Payer: HMO

## 2021-12-14 ENCOUNTER — Telehealth: Payer: Self-pay

## 2021-12-14 NOTE — Telephone Encounter (Signed)
Can take 2 weeks to work fully. Need to give it more time  Can increase gabapentin to 300mg  at night Can take tylenol up to 650 mg 3 times daily  If worsening pain would need to go to ER  Also wish him an early happy birthday

## 2021-12-14 NOTE — Telephone Encounter (Signed)
Patients spouse called to inform us that patient had an epidural on Wednesday and it has not helped at all. Patient is still in a lot of pain and is wanting something sent in to help with the pain. Patients spouse would like a call back on her mobile.

## 2021-12-15 NOTE — Telephone Encounter (Signed)
Left message for wife to call back for recommendations from Dr. Tamala Julian.

## 2021-12-16 ENCOUNTER — Other Ambulatory Visit: Payer: HMO

## 2021-12-16 ENCOUNTER — Encounter: Payer: Self-pay | Admitting: Nurse Practitioner

## 2021-12-22 ENCOUNTER — Encounter: Payer: Self-pay | Admitting: Nurse Practitioner

## 2021-12-22 NOTE — Progress Notes (Signed)
Cardiology Office Note:    Date:  12/22/2021   ID:  [name], [name] [birth year], MRN [id number]  PCP:  Biagio Borg, MD   [facility] Cardiologist:  Ena Dawley, MD      Referring MD: Biagio Borg, MD   Follow-up for hypertension and coronary artery disease.  History of Present Illness:    [name] is a 66 y.o. male with a hx of diabetes, coronary artery disease with coronary CTA 8/16 showing calcium score of 1186, hypertension, HLD, obesity, recurrent DVTs, and PE.  He was previously followed by Dr. Meda Coffee.  His cardiac catheterization 02/16/2021 showed moderate CAD which was nonobstructive.  He followed up with Dr. Johney Frame 02/27/2021.  During that time he felt well.  He denied chest pain shortness of breath.  He continued to be physically active.  He was hoping to discontinue his Imdur due to his cardiac catheterization results.  He denies cardiac, lightheadedness, dizziness, lower extremity swelling, and orthopnea.  He reported compliance with his anticoagulation following his cardiac catheterization.  He presents to the clinic today for follow-up evaluation states he feels well.  We reviewed his previous angiography and he expressed understanding.  His blood pressures been well controlled at home.  He was recently in the hospital and noted to have low hemoglobin/hematocrit.  He is Jehovah's Witness.  His hemoglobin increased to 9.2 on 11/02/2021.  He reports that he will have another blood draw tomorrow.  We reviewed the importance of well-balanced diet and slowly increasing physical activity.  He also brings in a health screening tool from the insurance.  We reviewed the recommended screenings based off of his hospital results.  One of the screenings and was carotid ultrasound which she has not done.  I will leave this decision to him as to whether he has carotid ultrasounds done or not.  He does not have carotid bruits.  We will continue his current  medication regimen and plan follow-up for 4 to 6 months.  Today he denies chest pain, shortness of breath, lower extremity edema, fatigue, palpitations, melena, hematuria, hemoptysis, diaphoresis, weakness, presyncope, syncope, orthopnea, and PND.   Past Medical History:  Diagnosis Date   Arthritis    BACK PAIN    CAD (coronary artery disease) 06/28/2017   CAD in native artery    a. moderate by cardiac CT 2016.   Chronic bilateral low back pain with bilateral sciatica 01/12/2019   Degenerative arthritis of right knee 07/15/2016   Injected in 07/15/2016 Orthovisc injection started 10/07/2016 DepoMedrol 40 inj on 04/11/17, 25 cc aspirated Orthovisc injection in 05/30/2017     DEGENERATIVE JOINT DISEASE, KNEES, BILATERAL    Depression    Diabetes ([place]) 10/01/2014   Diabetes mellitus    type II   DIVERTICULOSIS, COLON    DVT, lower extremity ([place])    right   History of kidney stones    Hyperlipidemia    Hypertension    HYPOGONADISM, MALE    HYPOTENSION, ORTHOSTATIC    Kidney stones    Long term (current) use of anticoagulants 08/17/2017   Long term current use of anticoagulant    Lumbosacral radiculopathy at S1 06/14/2012   Maxillary sinus cyst 12/11/2018   Obesity    OBESITY, MORBID 03/04/2008   Qualifier: Diagnosis of  By: Jenny Reichmann MD, Hunt Oris    OSA (obstructive sleep apnea) 09/11/2013   Pulmonary emboli ([place])    Pulmonary embolism ([place]) 02/05/2011   RBBB 07/28/2017   Renal  insufficiency    a. prior h/o, does not appear chronic.   Superficial phlebitis of arm 03/18/2016   Vitamin D deficiency 07/09/2020    Past Surgical History:  Procedure Laterality Date   ankle surgury  2001 bilateral   with bone spurs and torn tendon   COLONOSCOPY  03/18/2010   EDG  2008   chronic Duodenitis   FLEXIBLE SIGMOIDOSCOPY N/A 11/03/2021   Procedure: FLEXIBLE SIGMOIDOSCOPY;  Surgeon: Doran Stabler, MD;  [facility];  Service: Gastroenterology;  Laterality: N/A;   GASTRIC BYPASS     HEEL  SPUR SURGERY Bilateral 2005   IR ANGIOGRAM PELVIS SELECTIVE OR SUPRASELECTIVE  11/02/2021   IR ANGIOGRAM PELVIS SELECTIVE OR SUPRASELECTIVE  11/04/2021   IR ANGIOGRAM SELECTIVE EACH ADDITIONAL VESSEL  11/02/2021   IR ANGIOGRAM SELECTIVE EACH ADDITIONAL VESSEL  11/02/2021   IR ANGIOGRAM SELECTIVE EACH ADDITIONAL VESSEL  11/02/2021   IR ANGIOGRAM SELECTIVE EACH ADDITIONAL VESSEL  11/04/2021   IR ANGIOGRAM SELECTIVE EACH ADDITIONAL VESSEL  11/04/2021   IR ANGIOGRAM SELECTIVE EACH ADDITIONAL VESSEL  11/04/2021   IR ANGIOGRAM SELECTIVE EACH ADDITIONAL VESSEL  11/04/2021   IR ANGIOGRAM VISCERAL SELECTIVE  11/02/2021   IR ANGIOGRAM VISCERAL SELECTIVE  11/04/2021   IR EMBO ART  VEN HEMORR LYMPH EXTRAV  INC GUIDE ROADMAPPING  11/02/2021   IR EMBO ART  VEN HEMORR LYMPH EXTRAV  INC GUIDE ROADMAPPING  11/04/2021   IR EMBO ART  VEN HEMORR LYMPH EXTRAV  INC GUIDE ROADMAPPING  11/04/2021   IR US GUIDE VASC ACCESS RIGHT  11/02/2021   IR US GUIDE [place] RIGHT  11/04/2021   knee surgury  2000 & 2003    cartilage damage   LEFT HEART CATH AND CORONARY ANGIOGRAPHY N/A 02/16/2021   Procedure: LEFT HEART CATH AND CORONARY ANGIOGRAPHY;  Surgeon: Sherren Mocha, MD;  Location: [facility];  Service: Cardiovascular;  Laterality: N/A;   LUMBAR LAMINECTOMY/DECOMPRESSION MICRODISCECTOMY  09/28/2012   Procedure: LUMBAR LAMINECTOMY/DECOMPRESSION MICRODISCECTOMY 2 LEVELS;  Surgeon: [name], MD;  Location: [facility];  Service: Orthopedics;  Laterality: Left;  Decompressive laminectomy L4-L5. Microdiscectomy L5-S1   Skin removal surgery     extra abdominal skin removed from his weight loss   TONSILLECTOMY AND ADENOIDECTOMY  age 72 or 6   TOTAL KNEE ARTHROPLASTY  01/10/2012   Procedure: TOTAL KNEE ARTHROPLASTY;  Surgeon: [name], MD;  [facility];  Service: Orthopedics;  Laterality: Left;   TOTAL KNEE ARTHROPLASTY Right 11/18/2020   Procedure: TOTAL KNEE ARTHROPLASTY;  Surgeon: Paralee Cancel,  MD;  [facility];  Service: Orthopedics;  Laterality: Right;  70 mins   UPPER GASTROINTESTINAL ENDOSCOPY  03/18/2010    Current Medications: No outpatient medications have been marked as taking for the 12/23/21 encounter (Appointment) with Deberah Pelton, NP.     Allergies:   Adhesive [tape] and Other   Social History   Socioeconomic History   Marital status: Married    Spouse name: Not on file   Number of children: 2   Years of education: Not on file   Highest education level: High school graduate  Occupational History   Occupation: Retired    Fish farm manager: [employer]  Tobacco Use   Smoking status: Former    Packs/day: 0.25    Years: 5.00    Pack years: 1.25    Types: Cigarettes    Quit date: 12/06/1978    Years since quitting: 43.0   Smokeless tobacco: Never  Vaping Use  Vaping Use: Never used  Substance and Sexual Activity   Alcohol use: Yes    Alcohol/week: 2.0 standard drinks    Types: 2 Standard drinks or equivalent per week   Drug use: No   Sexual activity: Not Currently  Other Topics Concern   Not on file  Social History Narrative   Daily caffeine use one per day   Protein drinks    Children; a friend and sister that helps   Brother that is around [place])   Son and dtr in [place]   Lives w/ wife in [place]   Left handed     Retired from [employer]   Former smoker 2 alcoholic beverages daily no caffeine no drug use no other tobacco at this time   Social Determinants of Radio broadcast assistant Strain: Not on file  Food Insecurity: Not on file  Transportation Needs: Not on file  Physical Activity: Not on file  Stress: Not on file  Social Connections: Not on file     Family History: The patient's family history includes Diabetes in his brother, sister, sister, and sister; Heart failure in his sister; Hypertension in his mother; Prostate cancer in his father. There is no history of Colon cancer, Colon polyps, Esophageal cancer, Rectal cancer,  or Stomach cancer.  ROS:   Please see the history of present illness.     All other systems reviewed and are negative.   Risk Assessment/Calculations:           Physical Exam:    VS:  There were no vitals taken for this visit.    Wt Readings from Last 3 Encounters:  12/02/21 258 lb (117 kg)  11/19/21 251 lb 5.2 oz (114 kg)  11/13/21 252 lb (114.3 kg)     GEN:  Well nourished, well developed in no acute distress HEENT: Normal NECK: No JVD; No carotid bruits LYMPHATICS: No lymphadenopathy CARDIAC: RRR, no murmurs, rubs, gallops RESPIRATORY:  Clear to auscultation without rales, wheezing or rhonchi  ABDOMEN: Soft, non-tender, non-distended MUSCULOSKELETAL:  No edema; No deformity  SKIN: Warm and dry NEUROLOGIC:  Alert and oriented x 3 PSYCHIATRIC:  Normal affect    EKGs/Labs/Other Studies Reviewed:    The following studies were reviewed today:   Cardiac catheterization 02/16/2021  1. Moderate calcific, nonobstructive proximal/mid LAD stenosis estimated at 50% 2. Mild nonobstructive left main, RCA, ramus intermedius, and LCx stenosis with no significant lesions in any of those vessels 3. Normal LVEDP   Recommend: Ongoing medical therapy - LAD stenosis appears 50% non-obstructive, similar to report of LAD on 2016 coronary CTA Resume warfarin today as instructed by anticoag clinic Resume lovenox tomorrow morning if bridging   Diagnostic Dominance: Right Intervention   EKG: None today.  Recent Labs: 03/10/2021: TSH 1.54 11/19/2021: ALT 19; BUN 20; Creatinine, Ser 1.32; Potassium 4.0; Sodium 142 12/02/2021: Hemoglobin 9.2; Platelet Count 194  Recent Lipid Panel    Component Value Date/Time   CHOL 116 09/11/2021 0812   TRIG 58.0 09/11/2021 0812   HDL 47.20 09/11/2021 0812   CHOLHDL 2 09/11/2021 0812   VLDL 11.6 09/11/2021 0812   LDLCALC 58 09/11/2021 0812   LDLCALC 74 07/09/2020 1033   LDLDIRECT 116.0 01/15/2020 1624    ASSESSMENT & PLAN    Coronary  artery disease-noted to be moderate and nonobstructive on cardiac catheterization 3/22.  Details above.  Not a candidate for aspirin due to warfarin therapy. Continue atorvastatin, nitroglycerin as needed Heart healthy low-sodium diet-salty 6 given Increase physical  activity as tolerated  Hyperlipidemia-09/11/2021: Cholesterol 116; HDL 47.20; LDL Cholesterol 58; Triglycerides 58.0; VLDL 11.6 Continue atorvastatin Heart healthy low-sodium high-fiber diet.   Increase physical activity as tolerated  Essential hypertension-BP today 122/62.  Has been well controlled at home. Continue chlorthalidone Heart healthy low-sodium diet-salty 6 given Increase physical activity as tolerated  Recurrent DVT/PE-denies lower extremity claudication and pain.  Denies shortness of breath.  Reports compliance with warfarin.  Denies bleeding issues. Continue warfarin Follows with Coumadin clinic  Type 2 diabetes-glucose 130 on 11/19/2021. Continue medical therapy Follows with PCP  Disposition: Follow-up with Dr. Johney Frame in 4-6 months.       Medication Adjustments/Labs and Tests Ordered: Current medicines are reviewed at length with the patient today.  Concerns regarding medicines are outlined above.  No orders of the defined types were placed in this encounter.  No orders of the defined types were placed in this encounter.   There are no Patient Instructions on file for this visit.   Signed, Deberah Pelton, NP  12/22/2021 7:07 AM      Notice: This dictation was prepared with Dragon dictation along with smaller phrase technology. Any transcriptional errors that result from this process are unintentional and may not be corrected upon review.  I spent 15 minutes examining this patient, reviewing medications, and using patient centered shared decision making involving her cardiac care.  Prior to her visit I spent greater than 20 minutes reviewing her past medical history,  medications, and prior  cardiac tests.

## 2021-12-23 ENCOUNTER — Ambulatory Visit (HOSPITAL_BASED_OUTPATIENT_CLINIC_OR_DEPARTMENT_OTHER): Payer: HMO | Admitting: General Practice

## 2021-12-23 ENCOUNTER — Encounter (HOSPITAL_BASED_OUTPATIENT_CLINIC_OR_DEPARTMENT_OTHER): Payer: Self-pay | Admitting: General Practice

## 2021-12-23 ENCOUNTER — Other Ambulatory Visit: Payer: Self-pay

## 2021-12-23 VITALS — BP 122/62 | HR 64 | Ht 74.0 in | Wt 259.0 lb

## 2021-12-23 DIAGNOSIS — E785 Hyperlipidemia, unspecified: Secondary | ICD-10-CM

## 2021-12-23 DIAGNOSIS — E119 Type 2 diabetes mellitus without complications: Secondary | ICD-10-CM | POA: Diagnosis not present

## 2021-12-23 DIAGNOSIS — I82409 Acute embolism and thrombosis of unspecified deep veins of unspecified lower extremity: Secondary | ICD-10-CM

## 2021-12-23 DIAGNOSIS — I10 Essential (primary) hypertension: Secondary | ICD-10-CM | POA: Diagnosis not present

## 2021-12-23 DIAGNOSIS — I2510 Atherosclerotic heart disease of native coronary artery without angina pectoris: Secondary | ICD-10-CM

## 2021-12-23 NOTE — Patient Instructions (Signed)
Medication Instructions:  Your physician recommends that you continue on your current medications as directed. Please refer to the Current Medication list given to you today.  *If you need a refill on your cardiac medications before your next appointment, please call your pharmacy*   Follow-Up: At [facility], you and your health needs are our priority.  As part of our continuing mission to provide you with exceptional heart care, we have created designated Provider Care Teams.  These Care Teams include your primary Cardiologist (physician) and Advanced Practice Providers (APPs -  Physician Assistants and Nurse Practitioners) who all work together to provide you with the care you need, when you need it.  We recommend signing up for the patient portal called "MyChart".  Sign up information is provided on this After Visit Summary.  MyChart is used to connect with patients for Virtual Visits (Telemedicine).  Patients are able to view lab/test results, encounter notes, upcoming appointments, etc.  Non-urgent messages can be sent to your provider as well.   To learn more about what you can do with MyChart, go to [email or web address].    Your next appointment:   4-6 month(s)  The format for your next appointment:   In Person  Provider:   Gwyndolyn Kaufman, MD

## 2021-12-24 ENCOUNTER — Inpatient Hospital Stay: Payer: HMO | Attending: Nurse Practitioner

## 2021-12-24 ENCOUNTER — Inpatient Hospital Stay (HOSPITAL_BASED_OUTPATIENT_CLINIC_OR_DEPARTMENT_OTHER): Payer: HMO | Admitting: Hematology

## 2021-12-24 DIAGNOSIS — D62 Acute posthemorrhagic anemia: Secondary | ICD-10-CM | POA: Diagnosis not present

## 2021-12-24 DIAGNOSIS — I2510 Atherosclerotic heart disease of native coronary artery without angina pectoris: Secondary | ICD-10-CM | POA: Insufficient documentation

## 2021-12-24 DIAGNOSIS — D500 Iron deficiency anemia secondary to blood loss (chronic): Secondary | ICD-10-CM

## 2021-12-24 DIAGNOSIS — Z86711 Personal history of pulmonary embolism: Secondary | ICD-10-CM | POA: Insufficient documentation

## 2021-12-24 DIAGNOSIS — E119 Type 2 diabetes mellitus without complications: Secondary | ICD-10-CM | POA: Diagnosis not present

## 2021-12-24 DIAGNOSIS — Z86718 Personal history of other venous thrombosis and embolism: Secondary | ICD-10-CM | POA: Diagnosis not present

## 2021-12-24 DIAGNOSIS — K625 Hemorrhage of anus and rectum: Secondary | ICD-10-CM | POA: Diagnosis not present

## 2021-12-24 DIAGNOSIS — Z87442 Personal history of urinary calculi: Secondary | ICD-10-CM | POA: Insufficient documentation

## 2021-12-24 DIAGNOSIS — Z9884 Bariatric surgery status: Secondary | ICD-10-CM | POA: Insufficient documentation

## 2021-12-24 DIAGNOSIS — E785 Hyperlipidemia, unspecified: Secondary | ICD-10-CM | POA: Insufficient documentation

## 2021-12-24 DIAGNOSIS — C61 Malignant neoplasm of prostate: Secondary | ICD-10-CM | POA: Diagnosis not present

## 2021-12-24 DIAGNOSIS — Z888 Allergy status to other drugs, medicaments and biological substances status: Secondary | ICD-10-CM | POA: Insufficient documentation

## 2021-12-24 DIAGNOSIS — I10 Essential (primary) hypertension: Secondary | ICD-10-CM | POA: Insufficient documentation

## 2021-12-24 DIAGNOSIS — Z79899 Other long term (current) drug therapy: Secondary | ICD-10-CM | POA: Diagnosis not present

## 2021-12-24 DIAGNOSIS — Z8719 Personal history of other diseases of the digestive system: Secondary | ICD-10-CM | POA: Insufficient documentation

## 2021-12-24 DIAGNOSIS — Z7901 Long term (current) use of anticoagulants: Secondary | ICD-10-CM | POA: Diagnosis not present

## 2021-12-24 LAB — CBC WITH DIFFERENTIAL (CANCER CENTER ONLY)
Abs Immature Granulocytes: 0.01 10*3/uL (ref 0.00–0.07)
Basophils Absolute: 0 10*3/uL (ref 0.0–0.1)
Basophils Relative: 0 %
Eosinophils Absolute: 0.2 10*3/uL (ref 0.0–0.5)
Eosinophils Relative: 2 %
HCT: 35.5 % — ABNORMAL LOW (ref 39.0–52.0)
Hemoglobin: 11.1 g/dL — ABNORMAL LOW (ref 13.0–17.0)
Immature Granulocytes: 0 %
Lymphocytes Relative: 25 %
Lymphs Abs: 1.6 10*3/uL (ref 0.7–4.0)
MCH: 23.6 pg — ABNORMAL LOW (ref 26.0–34.0)
MCHC: 31.3 g/dL (ref 30.0–36.0)
MCV: 75.5 fL — ABNORMAL LOW (ref 80.0–100.0)
Monocytes Absolute: 0.5 10*3/uL (ref 0.1–1.0)
Monocytes Relative: 7 %
Neutro Abs: 4.2 10*3/uL (ref 1.7–7.7)
Neutrophils Relative %: 66 %
Platelet Count: 181 10*3/uL (ref 150–400)
RBC: 4.7 MIL/uL (ref 4.22–5.81)
RDW: 15.3 % (ref 11.5–15.5)
WBC Count: 6.4 10*3/uL (ref 4.0–10.5)
nRBC: 0 % (ref 0.0–0.2)

## 2021-12-24 LAB — FERRITIN: Ferritin: 61 ng/mL (ref 24–336)

## 2021-12-24 NOTE — Progress Notes (Signed)
[facility]   [phone] [phone]   Clinic Follow up Note   Patient Care Team: Biagio Borg, MD as PCP - General Meda Coffee Jamse Belfast, MD as PCP - Cardiology (Cardiology) Suella Broad, MD as Consulting Physician (Physical Medicine and Rehabilitation) Eldridge Abrahams, MD as Referring Physician (Surgical Oncology) Renato Shin, MD as Consulting Physician (Endocrinology) Calvert Cantor, MD as Consulting Physician (Ophthalmology)  Date of Service:  12/24/2021  CHIEF COMPLAINT: f/u of acute anemia  CURRENT THERAPY:  IV Feraheme as needed  ASSESSMENT & PLAN:  [name] is a 66 y.o. male with   1. Acute blood loss anemia, secondary to prostate biopsy  -He developed hematuria and underwent prostate biopsy for work-up, he developed rectal bleeding and was admitted for severe anemia 11/02/2021 - 11/08/2021 -Hemoglobin dropped to 4.9, he does not accept blood products as a Jehovah's Witness.  He underwent IR rectal artery embolization, vitamin K, Aranesp, IV iron, and started on M08 and folic acid. -he received IV Feraheme 12/7 and 11/19/21. -labs reviewed, hgb improving, up to 11.1 today. His ferritin level today is pending; we will contact him if he needs repeat IV Feraheme. -given improvement, we will repeat lab in 2 and 4 months.   2. H/o provoked bilateral PE after orthopedic surgery 2007 -He was on Coumadin for 15 years after PE, held during hospitalization for acute blood loss anemia  -do not plan to restart coumadin at this time. -Currently no signs of recurrent thrombosis or PE   3. Prostate cancer cT1cNX, Gleason 7 -intermediate risk, further care plan per Dr. Louis Meckel at [facility] urology -appointment with Dr. Tammi Klippel in rad onc on 01/05/22.    Plan: -Labs reviewed, Hgb 11.1, ferritin is pending -IV Feraheme if ferritin is low (less than 25) -lab in 2 and 4 months -f/u in 4 months   No problem-specific Assessment & Plan notes  found for this encounter.   INTERVAL HISTORY:  [name] is here for a follow up of acute anemia. He was last seen by NP Lacie on 11/11/21. He presents to the clinic accompanied by his wife. He reports he is feeling much better since discharge from the hospital.   All other systems were reviewed with the patient and are negative.  MEDICAL HISTORY:  Past Medical History:  Diagnosis Date   Arthritis    BACK PAIN    CAD (coronary artery disease) 06/28/2017   CAD in native artery    a. moderate by cardiac CT 2016.   Chronic bilateral low back pain with bilateral sciatica 01/12/2019   Degenerative arthritis of right knee 07/15/2016   Injected in 07/15/2016 Orthovisc injection started 10/07/2016 DepoMedrol 40 inj on 04/11/17, 25 cc aspirated Orthovisc injection in 05/30/2017     DEGENERATIVE JOINT DISEASE, KNEES, BILATERAL    Depression    Diabetes ([place]) 10/01/2014   Diabetes mellitus    type II   DIVERTICULOSIS, COLON    DVT, lower extremity ([place])    right   History of kidney stones    Hyperlipidemia    Hypertension    HYPOGONADISM, MALE    HYPOTENSION, ORTHOSTATIC    Kidney stones    Long term (current) use of anticoagulants 08/17/2017   Long term current use of anticoagulant    Lumbosacral radiculopathy at S1 06/14/2012   Maxillary sinus cyst 12/11/2018   Obesity    OBESITY, MORBID 03/04/2008   Qualifier: Diagnosis of  By: Jenny Reichmann MD, Hunt Oris    OSA (  obstructive sleep apnea) 09/11/2013   Pulmonary emboli ([facility])    Pulmonary embolism ([place]) 02/05/2011   RBBB 07/28/2017   Renal insufficiency    a. prior h/o, does not appear chronic.   Superficial phlebitis of arm 03/18/2016   Vitamin D deficiency 07/09/2020    SURGICAL HISTORY: Past Surgical History:  Procedure Laterality Date   ankle surgury  2001 bilateral   with bone spurs and torn tendon   COLONOSCOPY  03/18/2010   EDG  2008   chronic Duodenitis   FLEXIBLE SIGMOIDOSCOPY N/A 11/03/2021   Procedure: FLEXIBLE SIGMOIDOSCOPY;   Surgeon: Doran Stabler, MD;  [facility];  Service: Gastroenterology;  Laterality: N/A;   GASTRIC BYPASS     HEEL SPUR SURGERY Bilateral 2005   IR ANGIOGRAM PELVIS SELECTIVE OR SUPRASELECTIVE  11/02/2021   IR ANGIOGRAM PELVIS SELECTIVE OR SUPRASELECTIVE  11/04/2021   IR ANGIOGRAM SELECTIVE EACH ADDITIONAL VESSEL  11/02/2021   IR ANGIOGRAM SELECTIVE EACH ADDITIONAL VESSEL  11/02/2021   IR ANGIOGRAM SELECTIVE EACH ADDITIONAL VESSEL  11/02/2021   IR ANGIOGRAM SELECTIVE EACH ADDITIONAL VESSEL  11/04/2021   IR ANGIOGRAM SELECTIVE EACH ADDITIONAL VESSEL  11/04/2021   IR ANGIOGRAM SELECTIVE EACH ADDITIONAL VESSEL  11/04/2021   IR ANGIOGRAM SELECTIVE EACH ADDITIONAL VESSEL  11/04/2021   IR ANGIOGRAM VISCERAL SELECTIVE  11/02/2021   IR ANGIOGRAM VISCERAL SELECTIVE  11/04/2021   IR EMBO ART  VEN HEMORR LYMPH EXTRAV  INC GUIDE ROADMAPPING  11/02/2021   IR EMBO ART  VEN HEMORR LYMPH EXTRAV  INC GUIDE ROADMAPPING  11/04/2021   IR EMBO ART  VEN HEMORR LYMPH EXTRAV  INC GUIDE ROADMAPPING  11/04/2021   IR US GUIDE VASC ACCESS RIGHT  11/02/2021   IR US GUIDE [place] RIGHT  11/04/2021   knee surgury  2000 & 2003    cartilage damage   LEFT HEART CATH AND CORONARY ANGIOGRAPHY N/A 02/16/2021   Procedure: LEFT HEART CATH AND CORONARY ANGIOGRAPHY;  Surgeon: Sherren Mocha, MD;  Location: [facility];  Service: Cardiovascular;  Laterality: N/A;   LUMBAR LAMINECTOMY/DECOMPRESSION MICRODISCECTOMY  09/28/2012   Procedure: LUMBAR LAMINECTOMY/DECOMPRESSION MICRODISCECTOMY 2 LEVELS;  Surgeon: [name], MD;  [facility];  Service: Orthopedics;  Laterality: Left;  Decompressive laminectomy L4-L5. Microdiscectomy L5-S1   Skin removal surgery     extra abdominal skin removed from his weight loss   TONSILLECTOMY AND ADENOIDECTOMY  age 16 or 6   TOTAL KNEE ARTHROPLASTY  01/10/2012   Procedure: TOTAL KNEE ARTHROPLASTY;  Surgeon: [name], MD;  [facility];  Service:  Orthopedics;  Laterality: Left;   TOTAL KNEE ARTHROPLASTY Right 11/18/2020   Procedure: TOTAL KNEE ARTHROPLASTY;  Surgeon: Paralee Cancel, MD;  [facility];  Service: Orthopedics;  Laterality: Right;  70 mins   UPPER GASTROINTESTINAL ENDOSCOPY  03/18/2010    I have reviewed the social history and family history with the patient and they are unchanged from previous note.  ALLERGIES:  is allergic to adhesive [tape] and other.  MEDICATIONS:  Current Outpatient Medications  Medication Sig Dispense Refill   Artificial Tear Ointment (ARTIFICIAL TEARS) ointment Place 1 drop into both eyes every 4 (four) hours as needed (dry eyes).      atorvastatin (LIPITOR) 80 MG tablet TAKE 1 TABLET BY MOUTH EVERY DAY 90 tablet 2   chlorthalidone (HYGROTON) 25 MG tablet Take 25 mg by mouth daily.     Cholecalciferol (VITAMIN D3) 125 MCG (5000 UT) CAPS Take 5,000 Units by mouth  daily.      diazepam (VALIUM) 5 MG tablet One tab by mouth, 2 hours before procedure. 2 tablet 0   escitalopram (LEXAPRO) 10 MG tablet Take 10 mg by mouth daily.     ferrous sulfate 325 (65 FE) MG EC tablet TAKE 1 TABLET BY MOUTH EVERY DAY 90 tablet 1   folic acid (FOLVITE) 1 MG tablet TAKE 1 TABLET BY MOUTH EVERY DAY 90 tablet 1   gabapentin (NEURONTIN) 100 MG capsule TAKE 2 CAPSULES BY MOUTH AT BEDTIME 60 capsule 0   glipiZIDE (GLUCOTROL XL) 2.5 MG 24 hr tablet TAKE 1 TABLET BY MOUTH DAILY WITH BREAKFAST. 90 tablet 3   metFORMIN (GLUCOPHAGE) 500 MG tablet TAKE 4 TABLETS BY MOUTH DAILY WITH BREAKFAST. 360 tablet 3   Multiple Vitamin (MULTIVITAMIN WITH MINERALS) TABS tablet Take 1 tablet by mouth daily at 12 noon.     nitroGLYCERIN (NITROSTAT) 0.4 MG SL tablet Place 1 tablet (0.4 mg total) under the tongue every 5 (five) minutes as needed for chest pain. 90 tablet 3   pantoprazole (PROTONIX) 40 MG tablet Take 1 tablet (40 mg total) by mouth 2 (two) times daily before a meal. 180 tablet 3   vitamin B-12 1000 MCG tablet Take 1 tablet  (1,000 mcg total) by mouth daily. 30 tablet 1   No current facility-administered medications for this visit.    PHYSICAL EXAMINATION: ECOG PERFORMANCE STATUS: 0 - Asymptomatic  Vitals:   12/24/21 1439  BP: 120/63  Pulse: 65  Resp: 18  Temp: 98 F (36.7 C)  SpO2: 100%   Wt Readings from Last 3 Encounters:  12/24/21 258 lb 8 oz (117.3 kg)  12/23/21 259 lb (117.5 kg)  12/02/21 258 lb (117 kg)     GENERAL:alert, no distress and comfortable SKIN: skin color normal, no rashes or significant lesions EYES: normal, Conjunctiva are pink and non-injected, sclera clear  NEURO: alert & oriented x 3 with fluent speech  LABORATORY DATA:  I have reviewed the data as listed CBC Latest Ref Rng & Units 12/24/2021 12/02/2021 11/25/2021  WBC 4.0 - 10.5 K/uL 6.4 6.7 5.8  Hemoglobin 13.0 - 17.0 g/dL 11.1(L) 9.2(L) 8.5(L)  Hematocrit 39.0 - 52.0 % 35.5(L) 30.6(L) 28.9(L)  Platelets 150 - 400 K/uL 181 194 267     CMP Latest Ref Rng & Units 11/19/2021 11/06/2021 11/04/2021  Glucose 70 - 99 mg/dL 130(H) 129(H) 117(H)  BUN 8 - 23 mg/dL 20 10 25(H)  Creatinine 0.61 - 1.24 mg/dL 1.32(H) 1.19 1.26(H)  Sodium 135 - 145 mmol/L 142 134(L) 135  Potassium 3.5 - 5.1 mmol/L 4.0 3.6 4.0  Chloride 98 - 111 mmol/L 109 105 110  CO2 22 - 32 mmol/L 25 26 24   Calcium 8.9 - 10.3 mg/dL 8.7(L) 8.3(L) 7.6(L)  Total Protein 6.5 - 8.1 g/dL 5.9(L) - -  Total Bilirubin 0.3 - 1.2 mg/dL 0.5 - -  Alkaline Phos 38 - 126 U/L 49 - -  AST 15 - 41 U/L 19 - -  ALT 0 - 44 U/L 19 - -      RADIOGRAPHIC STUDIES: I have personally reviewed the radiological images as listed and agreed with the findings in the report. No results found.    No orders of the defined types were placed in this encounter.  All questions were answered. The patient knows to call the clinic with any problems, questions or concerns. No barriers to learning was detected. The total time spent in the appointment was 15 minutes.  Truitt Merle,  MD 12/24/2021   I, Wilburn Mylar, am acting as scribe for Truitt Merle, MD.   I have reviewed the above documentation for accuracy and completeness, and I agree with the above.

## 2021-12-27 ENCOUNTER — Encounter: Payer: Self-pay | Admitting: Nurse Practitioner

## 2021-12-27 ENCOUNTER — Encounter: Payer: Self-pay | Admitting: Hematology

## 2021-12-27 DIAGNOSIS — D500 Iron deficiency anemia secondary to blood loss (chronic): Secondary | ICD-10-CM | POA: Insufficient documentation

## 2021-12-27 DIAGNOSIS — D509 Iron deficiency anemia, unspecified: Secondary | ICD-10-CM | POA: Insufficient documentation

## 2021-12-28 ENCOUNTER — Telehealth: Payer: Self-pay

## 2021-12-28 NOTE — Telephone Encounter (Signed)
Patients spouse called stating that after his epidural he felt okay for about 2 days but the pain is back to the way it was in the beginning. Spouse stated that they were told it may take a couple of injections before he starts to feel better, but they are wanting to know what the next steps are. Would like a call back to discuss.

## 2021-12-29 ENCOUNTER — Other Ambulatory Visit: Payer: Self-pay

## 2021-12-29 DIAGNOSIS — M5416 Radiculopathy, lumbar region: Secondary | ICD-10-CM

## 2021-12-30 NOTE — Progress Notes (Signed)
GU Location of Tumor / Histology: Prostate Ca  If Prostate Cancer, Gleason Score is (4 + 3) and PSA is (6.75 as of 11/25/2021)  Biopsies  Dr. Louis Meckel     Past/Anticipated interventions by urology, if any:   Past/Anticipated interventions by medical oncology, if any:   Weight changes, if any: No  IPSS:  9 SHIM:  19  Bowel/Bladder complaints, if any:  No   Nausea/Vomiting, if any: No  Pain issues, if any:  4/10 Lower back  SAFETY ISSUES: Prior radiation?  No Pacemaker/ICD?  No Possible current pregnancy?  Male Is the patient on methotrexate?  No  Current Complaints / other details:  More information on treatment option.

## 2021-12-30 NOTE — Telephone Encounter (Signed)
Per verbal from Dr. Tamala Julian, another epidural was ordered. Patient notified.

## 2022-01-01 ENCOUNTER — Other Ambulatory Visit: Payer: Self-pay | Admitting: Family Medicine

## 2022-01-01 ENCOUNTER — Other Ambulatory Visit: Payer: Self-pay

## 2022-01-01 ENCOUNTER — Ambulatory Visit
Admission: RE | Admit: 2022-01-01 | Discharge: 2022-01-01 | Disposition: A | Discharge status: Home / Self Care | Payer: HMO | Source: Ambulatory Visit | Attending: Family Medicine | Admitting: Family Medicine

## 2022-01-01 DIAGNOSIS — M5116 Intervertebral disc disorders with radiculopathy, lumbar region: Secondary | ICD-10-CM | POA: Diagnosis not present

## 2022-01-01 DIAGNOSIS — M5416 Radiculopathy, lumbar region: Secondary | ICD-10-CM

## 2022-01-01 MED ORDER — METHYLPREDNISOLONE ACETATE 40 MG/ML INJ SUSP (RADIOLOG
80.0000 mg | Freq: Once | INTRAMUSCULAR | Status: AC
  Administered 2022-01-01: 80 mg via EPIDURAL

## 2022-01-01 MED ORDER — IOPAMIDOL (ISOVUE-M 200) INJECTION 41%
1.0000 mL | Freq: Once | INTRAMUSCULAR | Status: AC
  Administered 2022-01-01: 1 mL via EPIDURAL

## 2022-01-01 NOTE — Discharge Instructions (Signed)

## 2022-01-02 ENCOUNTER — Other Ambulatory Visit: Payer: Self-pay | Admitting: Internal Medicine

## 2022-01-02 DIAGNOSIS — Z7901 Long term (current) use of anticoagulants: Secondary | ICD-10-CM

## 2022-01-05 ENCOUNTER — Other Ambulatory Visit: Payer: Self-pay

## 2022-01-05 ENCOUNTER — Ambulatory Visit
Admission: RE | Admit: 2022-01-05 | Discharge: 2022-01-05 | Disposition: A | Discharge status: Home / Self Care | Payer: HMO | Source: Ambulatory Visit | Attending: Radiation Oncology | Admitting: Radiation Oncology

## 2022-01-05 VITALS — BP 142/84 | HR 56 | Temp 96.7°F | Resp 18 | Ht 74.0 in | Wt 259.0 lb

## 2022-01-05 DIAGNOSIS — E785 Hyperlipidemia, unspecified: Secondary | ICD-10-CM | POA: Insufficient documentation

## 2022-01-05 DIAGNOSIS — E559 Vitamin D deficiency, unspecified: Secondary | ICD-10-CM | POA: Insufficient documentation

## 2022-01-05 DIAGNOSIS — I10 Essential (primary) hypertension: Secondary | ICD-10-CM | POA: Diagnosis not present

## 2022-01-05 DIAGNOSIS — Z87891 Personal history of nicotine dependence: Secondary | ICD-10-CM | POA: Insufficient documentation

## 2022-01-05 DIAGNOSIS — Z7901 Long term (current) use of anticoagulants: Secondary | ICD-10-CM | POA: Insufficient documentation

## 2022-01-05 DIAGNOSIS — Z79899 Other long term (current) drug therapy: Secondary | ICD-10-CM | POA: Diagnosis not present

## 2022-01-05 DIAGNOSIS — I2510 Atherosclerotic heart disease of native coronary artery without angina pectoris: Secondary | ICD-10-CM | POA: Diagnosis not present

## 2022-01-05 DIAGNOSIS — C61 Malignant neoplasm of prostate: Secondary | ICD-10-CM | POA: Insufficient documentation

## 2022-01-05 DIAGNOSIS — G4733 Obstructive sleep apnea (adult) (pediatric): Secondary | ICD-10-CM | POA: Diagnosis not present

## 2022-01-05 DIAGNOSIS — E119 Type 2 diabetes mellitus without complications: Secondary | ICD-10-CM | POA: Insufficient documentation

## 2022-01-05 DIAGNOSIS — Z7984 Long term (current) use of oral hypoglycemic drugs: Secondary | ICD-10-CM | POA: Diagnosis not present

## 2022-01-05 DIAGNOSIS — Z86711 Personal history of pulmonary embolism: Secondary | ICD-10-CM | POA: Diagnosis not present

## 2022-01-05 DIAGNOSIS — Z86718 Personal history of other venous thrombosis and embolism: Secondary | ICD-10-CM | POA: Insufficient documentation

## 2022-01-05 NOTE — Progress Notes (Signed)
Introduced myself to the patient as the prostate nurse navigator.  No barriers to care identified at this time.  He is here to discuss his radiation treatment options.  I gave him my business card and asked him to call me with questions or concerns.  Verbalized understanding.  ?

## 2022-01-05 NOTE — Progress Notes (Signed)
Radiation Oncology         [phone] ________________________________  Initial Outpatient Consultation  Name: [name] MRN: [id number]  Date: 01/05/2022  DOB: [birth year]  GE:XBMW, Hunt Oris, MD  Ardis Hughs, MD   REFERRING PHYSICIAN: Ardis Hughs, MD  DIAGNOSIS: 66 y.o. gentleman with Stage T1c adenocarcinoma of the prostate with Gleason score of 3+4, and PSA of 6.75.    ICD-10-CM   1. Malignant neoplasm of prostate ([place])  C61       HISTORY OF PRESENT ILLNESS: [name] is a 66 y.o. male with a diagnosis of prostate cancer. He was initially evaluated by Dr. Matilde Sprang for a febrile UTI associated with prostatitis In 2014 and LUTS resolved with antibiotics and antiinflammatory.  He was referred back to Dr. Matilde Sprang for evaluation of gross hematuria in 03/2021 and was again treated for UTI. His PSA was found to be elevated to 6 in 06/2021, and he was then treated for prostatitis and referred to Dr. Louis Meckel for follow up elevated PSA. When he returned for follow up with Dr. Louis Meckel on 09/08/21, a repeat PSA from 09/01/21 showed persistent elevation at 6.75. Digital rectal examination was performed at that time and was without discrete nodules.  The patient proceeded to transrectal ultrasound with 12 biopsies of the prostate on 10/26/21.  The prostate volume measured 40 cc.  Out of 12 core biopsies, 5 were positive.  The maximum Gleason score was 3+4, and this was seen in the right mid lateral (small focus) and right base (with perineural invasion). Additionally, Gleason 3+3 was seen in the right base lateral (with PNI), right mid, and left apex lateral (small focus).  Of note, his post-biopsy course was complicated by delayed rectal bleeding in the setting of anticoagulation (Coumadin). He was admitted to the hospital and underwent IR embolization of the inferior rectal artery. His hemoglobin dropped to 4.9 at its lowest, but he was able to recover well without  transfusion (patient declined blood products due to being a Jehovah's Witness).  He has remained off Coumadin since that time and most recent Hgb was 11.1 12/24/21.  The patient reviewed the biopsy results with his urologist and he has kindly been referred today for discussion of potential radiation treatment options.   PREVIOUS RADIATION THERAPY: No  PAST MEDICAL HISTORY:  Past Medical History:  Diagnosis Date   Arthritis    BACK PAIN    CAD (coronary artery disease) 06/28/2017   CAD in native artery    a. moderate by cardiac CT 2016.   Chronic bilateral low back pain with bilateral sciatica 01/12/2019   Degenerative arthritis of right knee 07/15/2016   Injected in 07/15/2016 Orthovisc injection started 10/07/2016 DepoMedrol 40 inj on 04/11/17, 25 cc aspirated Orthovisc injection in 05/30/2017     DEGENERATIVE JOINT DISEASE, KNEES, BILATERAL    Depression    Diabetes ([place]) 10/01/2014   Diabetes mellitus    type II   DIVERTICULOSIS, COLON    DVT, lower extremity ([place])    right   History of kidney stones    Hyperlipidemia    Hypertension    HYPOGONADISM, MALE    HYPOTENSION, ORTHOSTATIC    Kidney stones    Long term (current) use of anticoagulants 08/17/2017   Long term current use of anticoagulant    Lumbosacral radiculopathy at S1 06/14/2012   Maxillary sinus cyst 12/11/2018   Obesity    OBESITY, MORBID 03/04/2008   Qualifier: Diagnosis of  By: Jenny Reichmann MD, Jeneen Rinks  W    OSA (obstructive sleep apnea) 09/11/2013   Pulmonary emboli ([facility])    Pulmonary embolism ([place]) 02/05/2011   RBBB 07/28/2017   Renal insufficiency    a. prior h/o, does not appear chronic.   Superficial phlebitis of arm 03/18/2016   Vitamin D deficiency 07/09/2020      PAST SURGICAL HISTORY: Past Surgical History:  Procedure Laterality Date   ankle surgury  2001 bilateral   with bone spurs and torn tendon   COLONOSCOPY  03/18/2010   EDG  03-09-2007   chronic Duodenitis   FLEXIBLE SIGMOIDOSCOPY N/A 11/03/2021   Procedure:  FLEXIBLE SIGMOIDOSCOPY;  Surgeon: Doran Stabler, MD;  Location: [facility];  Service: Gastroenterology;  Laterality: N/A;   GASTRIC BYPASS     HEEL SPUR SURGERY Bilateral Mar 08, 2004   IR ANGIOGRAM PELVIS SELECTIVE OR SUPRASELECTIVE  11/02/2021   IR ANGIOGRAM PELVIS SELECTIVE OR SUPRASELECTIVE  11/04/2021   IR ANGIOGRAM SELECTIVE EACH ADDITIONAL VESSEL  11/02/2021   IR ANGIOGRAM SELECTIVE EACH ADDITIONAL VESSEL  11/02/2021   IR ANGIOGRAM SELECTIVE EACH ADDITIONAL VESSEL  11/02/2021   IR ANGIOGRAM SELECTIVE EACH ADDITIONAL VESSEL  11/04/2021   IR ANGIOGRAM SELECTIVE EACH ADDITIONAL VESSEL  11/04/2021   IR ANGIOGRAM SELECTIVE EACH ADDITIONAL VESSEL  11/04/2021   IR ANGIOGRAM SELECTIVE EACH ADDITIONAL VESSEL  11/04/2021   IR ANGIOGRAM VISCERAL SELECTIVE  11/02/2021   IR ANGIOGRAM VISCERAL SELECTIVE  11/04/2021   IR EMBO ART  VEN HEMORR LYMPH EXTRAV  INC GUIDE ROADMAPPING  11/02/2021   IR EMBO ART  VEN HEMORR LYMPH EXTRAV  INC GUIDE ROADMAPPING  11/04/2021   IR EMBO ART  VEN HEMORR LYMPH EXTRAV  INC GUIDE ROADMAPPING  11/04/2021   IR US GUIDE VASC ACCESS RIGHT  11/02/2021   IR US GUIDE [place] RIGHT  11/04/2021   knee surgury  2000 & 08-Mar-2002    cartilage damage   LEFT HEART CATH AND CORONARY ANGIOGRAPHY N/A 02/16/2021   Procedure: LEFT HEART CATH AND CORONARY ANGIOGRAPHY;  Surgeon: Sherren Mocha, MD;  Location: [facility];  Service: Cardiovascular;  Laterality: N/A;   LUMBAR LAMINECTOMY/DECOMPRESSION MICRODISCECTOMY  09/28/2012   Procedure: LUMBAR LAMINECTOMY/DECOMPRESSION MICRODISCECTOMY 2 LEVELS;  Surgeon: [name], MD;  Location: [facility];  Service: Orthopedics;  Laterality: Left;  Decompressive laminectomy L4-L5. Microdiscectomy L5-S1   Skin removal surgery     extra abdominal skin removed from his weight loss   TONSILLECTOMY AND ADENOIDECTOMY  age 75 or 6   TOTAL KNEE ARTHROPLASTY  01/10/2012   Procedure: TOTAL KNEE ARTHROPLASTY;  Surgeon: [name], MD;  Location: [facility];  Service: Orthopedics;  Laterality: Left;   TOTAL KNEE ARTHROPLASTY Right 11/18/2020   Procedure: TOTAL KNEE ARTHROPLASTY;  Surgeon: Paralee Cancel, MD;  Location: [facility];  Service: Orthopedics;  Laterality: Right;  70 mins   UPPER GASTROINTESTINAL ENDOSCOPY  03/18/2010    FAMILY HISTORY:  Family History  Problem Relation Age of Onset   Hypertension Mother    Prostate cancer Father    Diabetes Sister        died with DM 03-08-2001, 2 more sisters with diabetes   Heart failure Sister    Diabetes Brother    Diabetes Sister    Diabetes Sister    Colon cancer Neg Hx    Colon polyps Neg Hx    Esophageal cancer Neg Hx    Rectal cancer Neg Hx    Stomach cancer Neg Hx     SOCIAL HISTORY:  Social History   Socioeconomic History   Marital status: Married    Spouse name: Not on file   Number of children: 2   Years of education: Not on file   Highest education level: High school graduate  Occupational History   Occupation: Retired    Fish farm manager: [employer]  Tobacco Use   Smoking status: Former    Packs/day: 0.25    Years: 5.00    Pack years: 1.25    Types: Cigarettes    Quit date: 12/06/1978    Years since quitting: 43.1   Smokeless tobacco: Never  Vaping Use   Vaping Use: Never used  Substance and Sexual Activity   Alcohol use: Yes    Alcohol/week: 2.0 standard drinks    Types: 2 Standard drinks or equivalent per week   Drug use: No   Sexual activity: Not Currently  Other Topics Concern   Not on file  Social History Narrative   Daily caffeine use one per day   Protein drinks    Children; a friend and sister that helps   Brother that is around [place])   Son and dtr in [place]   Lives w/ wife in [place]   Left handed     Retired from [employer]   Former smoker 2 alcoholic beverages daily no caffeine no drug use no other tobacco at this time   Social Determinants of Radio broadcast assistant Strain: Not on file  Food Insecurity: Not on file  Transportation  Needs: Not on file  Physical Activity: Not on file  Stress: Not on file  Social Connections: Not on file  Intimate Partner Violence: Not on file    ALLERGIES: Adhesive [tape] and Other  MEDICATIONS:  Current Outpatient Medications  Medication Sig Dispense Refill   Artificial Tear Ointment (ARTIFICIAL TEARS) ointment Place 1 drop into both eyes every 4 (four) hours as needed (dry eyes).      atorvastatin (LIPITOR) 80 MG tablet TAKE 1 TABLET BY MOUTH EVERY DAY 90 tablet 2   chlorthalidone (HYGROTON) 25 MG tablet Take 25 mg by mouth daily.     Cholecalciferol (VITAMIN D3) 125 MCG (5000 UT) CAPS Take 5,000 Units by mouth daily.      diazepam (VALIUM) 5 MG tablet One tab by mouth, 2 hours before procedure. 2 tablet 0   escitalopram (LEXAPRO) 10 MG tablet Take 10 mg by mouth daily.     ferrous sulfate 325 (65 FE) MG EC tablet TAKE 1 TABLET BY MOUTH EVERY DAY 90 tablet 1   folic acid (FOLVITE) 1 MG tablet TAKE 1 TABLET BY MOUTH EVERY DAY 90 tablet 1   gabapentin (NEURONTIN) 100 MG capsule TAKE 2 CAPSULES BY MOUTH AT BEDTIME 60 capsule 0   glipiZIDE (GLUCOTROL XL) 2.5 MG 24 hr tablet TAKE 1 TABLET BY MOUTH DAILY WITH BREAKFAST. 90 tablet 3   metFORMIN (GLUCOPHAGE) 500 MG tablet TAKE 4 TABLETS BY MOUTH DAILY WITH BREAKFAST. 360 tablet 3   Multiple Vitamin (MULTIVITAMIN WITH MINERALS) TABS tablet Take 1 tablet by mouth daily at 12 noon.     nitroGLYCERIN (NITROSTAT) 0.4 MG SL tablet Place 1 tablet (0.4 mg total) under the tongue every 5 (five) minutes as needed for chest pain. 90 tablet 3   pantoprazole (PROTONIX) 40 MG tablet Take 1 tablet (40 mg total) by mouth 2 (two) times daily before a meal. 180 tablet 3   vitamin B-12 1000 MCG tablet Take 1 tablet (1,000 mcg total) by mouth daily. 30 tablet 1  No current facility-administered medications for this encounter.    REVIEW OF SYSTEMS:  On review of systems, the patient reports that he is doing well overall. He denies any chest pain, shortness  of breath, cough, fevers, chills, night sweats, unintended weight changes. He denies any bowel disturbances, and denies abdominal pain, nausea or vomiting. He denies any new musculoskeletal or joint aches or pains. His IPSS was 9, indicating mild urinary symptoms. His SHIM was 19, indicating he has mild erectile dysfunction. A complete review of systems is obtained and is otherwise negative.    PHYSICAL EXAM:  Wt Readings from Last 3 Encounters:  01/05/22 259 lb (117.5 kg)  12/24/21 258 lb 8 oz (117.3 kg)  12/23/21 259 lb (117.5 kg)   Temp Readings from Last 3 Encounters:  01/05/22 (!) 96.7 F (35.9 C) (Temporal)  12/24/21 98 F (36.7 C) (Oral)  11/19/21 98.5 F (36.9 C) (Oral)   BP Readings from Last 3 Encounters:  01/05/22 (!) 142/84  01/01/22 (!) 158/95  12/24/21 120/63   Pulse Readings from Last 3 Encounters:  01/05/22 (!) 56  01/01/22 60  12/24/21 65   Pain Assessment Pain Score: 4  Pain Loc: Back (lower)/10  In general this is a well appearing African-American male in no acute distress. He's alert and oriented x4 and appropriate throughout the examination. Cardiopulmonary assessment is negative for acute distress, and he exhibits normal effort.     KPS = 100  100 - Normal; no complaints; no evidence of disease. 90   - Able to carry on normal activity; minor signs or symptoms of disease. 80   - Normal activity with effort; some signs or symptoms of disease. 90   - Cares for self; unable to carry on normal activity or to do active work. 60   - Requires occasional assistance, but is able to care for most of his personal needs. 50   - Requires considerable assistance and frequent medical care. 12   - Disabled; requires special care and assistance. 49   - Severely disabled; hospital admission is indicated although death not imminent. 8   - Very sick; hospital admission necessary; active supportive treatment necessary. 10   - Moribund; fatal processes progressing  rapidly. 0     - Dead  Karnofsky DA, Abelmann Sangaree, Craver LS and Burchenal JH [phone]) The use of the nitrogen mustards in the palliative treatment of carcinoma: with particular reference to bronchogenic carcinoma Cancer 1 634-56  LABORATORY DATA:  Lab Results  Component Value Date   WBC 6.4 12/24/2021   HGB 11.1 (L) 12/24/2021   HCT 35.5 (L) 12/24/2021   MCV 75.5 (L) 12/24/2021   PLT 181 12/24/2021   Lab Results  Component Value Date   NA 142 11/19/2021   K 4.0 11/19/2021   CL 109 11/19/2021   CO2 25 11/19/2021   Lab Results  Component Value Date   ALT 19 11/19/2021   AST 19 11/19/2021   ALKPHOS 49 11/19/2021   BILITOT 0.5 11/19/2021     RADIOGRAPHY: DG Epidural/Nerve Root  Result Date: 01/01/2022 CLINICAL DATA:  Lumbar radiculopathy. Displacement of the L5-S1 lumbar nerve root. Right S1 radiculopathy. Patient had good relief previous S1 nerve root block/foraminal epidural. Pain has recurred. FLUOROSCOPY TIME:  Radiation Exposure Index (as provided by the fluoroscopic device): Dose area product 18.29 uGy*m2 PROCEDURE: SELECTIVE NERVE ROOT AND TRANSFORAMINAL EPIDURAL STEROID INJECTION: A transforaminal approach was performed on the right at the S1 foramen. The overlying skin was cleansed and  anesthetized. A 22 gauge spinal needle was advanced into the foramen from a lateral oblique approach. Injection of 2cc of Isovue-M 200 outlined the nerve root and extended into the epidural space. No vascular or intrathecal spread is evident. I then injected 80 mg of Depo-Medrol and 1.5 ml of 1% lidocaine. The patient tolerated the procedure without evidence for complication. The patient was observed for 20 minutes prior to discharge in stable neurologic condition. IMPRESSION: Technically successful selective nerve root block and transforaminal epidural steroid injection on theright at the S1 foramen. Electronically Signed   By: San Morelle M.D.   On: 01/01/2022 12:23   DG INJECT  DIAG/THERA/INC NEEDLE/CATH/PLC EPI/LUMB/SAC W/IMG  Result Date: 12/08/2021 CLINICAL DATA:  Lumbosacral spondylosis without myelopathy with radiculopathy. Left buttock and posterior leg pain. Large disc extrusion at L5-S1 with S1 nerve root impingement. Prior surgery at L5-S1 in 2013. EXAM: DG INJECT/THERA INC NEEDLE/CATH/PLC EPI/LUMB/SAC W/IMG FLUOROSCOPY TIME:  Radiation Exposure Index (as provided by the fluoroscopic device): 4.5 mGy Fluoroscopy Time:  22 seconds Number of Acquired Images:  0 PROCEDURE: The procedure, risks, benefits, and alternatives were explained to the patient. Questions regarding the procedure were encouraged and answered. The patient understands and consents to the procedure. LEFT S1 NERVE ROOT BLOCK AND TRANSFORAMINAL EPIDURAL: A posterior oblique approach was taken to the intervertebral foramen on the left at S1 using a 3.5 inch 22 gauge spinal needle. Injection of Isovue M 200 outlined the left S1 nerve root and showed good epidural spread. No vascular opacification is seen. 80 mg of Depo-Medrol mixed with 2 mL of 1% lidocaine were instilled. The procedure was well-tolerated, and the patient was discharged thirty minutes following the injection in good condition. COMPLICATIONS: None immediate. IMPRESSION: Technically successful injection consisting of a left S1 nerve root block and transforaminal epidural. Electronically Signed   By: Titus Dubin M.D.   On: 12/08/2021 09:10      IMPRESSION/PLAN: 1. 66 y.o. gentleman with Stage T1c adenocarcinoma of the prostate with Gleason Score of 3+4, and PSA of 6.75. We discussed the patient's workup and outlined the nature of prostate cancer in this setting. The patient's T stage, Gleason's score, and PSA put him into the favorable intermediate risk group. Accordingly, he is eligible for a variety of potential treatment options including brachytherapy, 5.5 weeks of external radiation, or prostatectomy. We discussed the available radiation  techniques, and focused on the details and logistics of delivery. We discussed and outlined the risks, benefits, short and long-term effects associated with radiotherapy and compared and contrasted these with prostatectomy. We discussed the role of SpaceOAR gel in reducing the rectal toxicity associated with radiotherapy. He was encouraged to ask questions that were answered to his stated satisfaction.  At the conclusion of our conversation, the patient is interested in moving forward with 5.5 weeks of external beam therapy. We will share our discussion with Dr. Louis Meckel and proceed with treatment planning accordingly.  Given his previous complication following biopsy, we will forego fiducial markers and SpaceOAR gel placement and just proceed with external beam radiation. The patient appears to have a good understanding of his disease and our treatment recommendations which are of curative intent and is in agreement with the stated plan.  We will reach out to him to schedule his CT simulation in anticipation of beginning his daily radiation treatments in the near future.  He knows that he is welcome to call at anytime with any questions or concerns in the interim related to radiotherapy.  We personally spent 70 minutes in this encounter including chart review, reviewing radiological studies, meeting face-to-face with the patient, entering orders and completing documentation.    Nicholos Johns, PA-C    Tyler Pita, MD  [facility] Direct Dial: [phone]   Fax: [phone] [email or web address]   Skype   [email or web address]   This document serves as a record of services personally performed by Tyler Pita, MD and Freeman Caldron, PA-C. It was created on their behalf by Wilburn Mylar, a trained medical scribe. The creation of this record is based on the scribe's personal observations and the provider's statements to them. This document has been checked and approved by the attending  provider.

## 2022-01-12 ENCOUNTER — Ambulatory Visit
Admission: RE | Admit: 2022-01-12 | Discharge: 2022-01-12 | Disposition: A | Discharge status: Home / Self Care | Payer: HMO | Source: Ambulatory Visit | Attending: Radiation Oncology | Admitting: Radiation Oncology

## 2022-01-12 ENCOUNTER — Other Ambulatory Visit: Payer: Self-pay

## 2022-01-12 ENCOUNTER — Telehealth: Payer: Self-pay | Admitting: *Deleted

## 2022-01-12 DIAGNOSIS — C61 Malignant neoplasm of prostate: Secondary | ICD-10-CM | POA: Insufficient documentation

## 2022-01-12 DIAGNOSIS — Z510 Encounter for antineoplastic radiation therapy: Secondary | ICD-10-CM | POA: Insufficient documentation

## 2022-01-12 NOTE — Telephone Encounter (Signed)
CALLED PATIENT TO REMIND OF SIM APPT. FOR 01-12-22- ARRIVAL TIME- 1:45 PM @ [place], PATIENT INFORMED TO ARRIVE WITH FULL BLADDER AND EMPTY BOWEL, SPOKE WITH PATIENT AND HE IS AWARE OF THIS APPT.AND THE INSTRUCTIONS

## 2022-01-12 NOTE — Progress Notes (Signed)
°  Radiation Oncology         ([phone] ________________________________  Name: ZAIVION KUNDRAT MRN: [id number]  Date: 01/12/2022  DOB: 06-02-1956  SIMULATION AND TREATMENT PLANNING NOTE    ICD-10-CM   1. Malignant neoplasm of prostate ([place])  C61       DIAGNOSIS:  66 y.o. gentleman with Stage T1c adenocarcinoma of the prostate with Gleason score of 3+4, and PSA of 6.75.  NARRATIVE:  The patient was brought to the [facility].  Identity was confirmed.  All relevant records and images related to the planned course of therapy were reviewed.  The patient freely provided informed written consent to proceed with treatment after reviewing the details related to the planned course of therapy. The consent form was witnessed and verified by the simulation staff.  Then, the patient was set-up in a stable reproducible supine position for radiation therapy.  A vacuum lock pillow device was custom fabricated to position his legs in a reproducible immobilized position.  Then, I performed a urethrogram under sterile conditions to identify the prostatic apex.  CT images were obtained.  Surface markings were placed.  The CT images were loaded into the planning software.  Then the prostate target and avoidance structures including the rectum, bladder, bowel and hips were contoured.  Treatment planning then occurred.  The radiation prescription was entered and confirmed.  A total of one complex treatment devices was fabricated. I have requested : Intensity Modulated Radiotherapy (IMRT) is medically necessary for this case for the following reason:  Rectal sparing.Marland Kitchen  PLAN:  The patient will receive 70 Gy in 28 fractions.  ________________________________  Sheral Apley Tammi Klippel, M.D.

## 2022-01-13 DIAGNOSIS — Z510 Encounter for antineoplastic radiation therapy: Secondary | ICD-10-CM | POA: Diagnosis not present

## 2022-01-13 DIAGNOSIS — C61 Malignant neoplasm of prostate: Secondary | ICD-10-CM | POA: Diagnosis not present

## 2022-01-21 ENCOUNTER — Other Ambulatory Visit: Payer: Self-pay

## 2022-01-21 ENCOUNTER — Ambulatory Visit
Admission: RE | Admit: 2022-01-21 | Discharge: 2022-01-21 | Disposition: A | Discharge status: Home / Self Care | Payer: HMO | Source: Ambulatory Visit | Attending: Radiation Oncology | Admitting: Radiation Oncology

## 2022-01-21 DIAGNOSIS — C61 Malignant neoplasm of prostate: Secondary | ICD-10-CM | POA: Diagnosis not present

## 2022-01-21 DIAGNOSIS — Z510 Encounter for antineoplastic radiation therapy: Secondary | ICD-10-CM | POA: Diagnosis not present

## 2022-01-22 ENCOUNTER — Ambulatory Visit
Admission: RE | Admit: 2022-01-22 | Discharge: 2022-01-22 | Disposition: A | Discharge status: Home / Self Care | Payer: HMO | Source: Ambulatory Visit | Attending: Radiation Oncology | Admitting: Radiation Oncology

## 2022-01-22 DIAGNOSIS — Z510 Encounter for antineoplastic radiation therapy: Secondary | ICD-10-CM | POA: Diagnosis not present

## 2022-01-22 DIAGNOSIS — C61 Malignant neoplasm of prostate: Secondary | ICD-10-CM | POA: Diagnosis not present

## 2022-01-25 ENCOUNTER — Other Ambulatory Visit: Payer: Self-pay

## 2022-01-25 ENCOUNTER — Ambulatory Visit
Admission: RE | Admit: 2022-01-25 | Discharge: 2022-01-25 | Disposition: A | Discharge status: Home / Self Care | Payer: HMO | Source: Ambulatory Visit | Attending: Radiation Oncology | Admitting: Radiation Oncology

## 2022-01-25 DIAGNOSIS — Z510 Encounter for antineoplastic radiation therapy: Secondary | ICD-10-CM | POA: Diagnosis not present

## 2022-01-25 DIAGNOSIS — C61 Malignant neoplasm of prostate: Secondary | ICD-10-CM | POA: Diagnosis not present

## 2022-01-26 ENCOUNTER — Ambulatory Visit
Admission: RE | Admit: 2022-01-26 | Discharge: 2022-01-26 | Disposition: A | Discharge status: Home / Self Care | Payer: HMO | Source: Ambulatory Visit | Attending: Radiation Oncology | Admitting: Radiation Oncology

## 2022-01-26 DIAGNOSIS — Z510 Encounter for antineoplastic radiation therapy: Secondary | ICD-10-CM | POA: Diagnosis not present

## 2022-01-26 DIAGNOSIS — C61 Malignant neoplasm of prostate: Secondary | ICD-10-CM | POA: Diagnosis not present

## 2022-01-27 ENCOUNTER — Other Ambulatory Visit: Payer: Self-pay

## 2022-01-27 ENCOUNTER — Ambulatory Visit
Admission: RE | Admit: 2022-01-27 | Discharge: 2022-01-27 | Disposition: A | Discharge status: Home / Self Care | Payer: HMO | Source: Ambulatory Visit | Attending: Radiation Oncology | Admitting: Radiation Oncology

## 2022-01-27 DIAGNOSIS — Z510 Encounter for antineoplastic radiation therapy: Secondary | ICD-10-CM | POA: Diagnosis not present

## 2022-01-27 DIAGNOSIS — C61 Malignant neoplasm of prostate: Secondary | ICD-10-CM | POA: Diagnosis not present

## 2022-01-28 ENCOUNTER — Ambulatory Visit
Admission: RE | Admit: 2022-01-28 | Discharge: 2022-01-28 | Disposition: A | Discharge status: Home / Self Care | Payer: HMO | Source: Ambulatory Visit | Attending: Radiation Oncology | Admitting: Radiation Oncology

## 2022-01-28 DIAGNOSIS — C61 Malignant neoplasm of prostate: Secondary | ICD-10-CM | POA: Diagnosis not present

## 2022-01-28 DIAGNOSIS — Z510 Encounter for antineoplastic radiation therapy: Secondary | ICD-10-CM | POA: Diagnosis not present

## 2022-01-29 ENCOUNTER — Other Ambulatory Visit: Payer: Self-pay

## 2022-01-29 ENCOUNTER — Ambulatory Visit
Admission: RE | Admit: 2022-01-29 | Discharge: 2022-01-29 | Disposition: A | Discharge status: Home / Self Care | Payer: HMO | Source: Ambulatory Visit | Attending: Radiation Oncology | Admitting: Radiation Oncology

## 2022-01-29 DIAGNOSIS — Z510 Encounter for antineoplastic radiation therapy: Secondary | ICD-10-CM | POA: Diagnosis not present

## 2022-01-29 DIAGNOSIS — C61 Malignant neoplasm of prostate: Secondary | ICD-10-CM | POA: Diagnosis not present

## 2022-02-01 ENCOUNTER — Ambulatory Visit
Admission: RE | Admit: 2022-02-01 | Discharge: 2022-02-01 | Disposition: A | Discharge status: Home / Self Care | Payer: HMO | Source: Ambulatory Visit | Attending: Radiation Oncology | Admitting: Radiation Oncology

## 2022-02-01 ENCOUNTER — Other Ambulatory Visit: Payer: Self-pay

## 2022-02-01 DIAGNOSIS — C61 Malignant neoplasm of prostate: Secondary | ICD-10-CM | POA: Diagnosis not present

## 2022-02-01 DIAGNOSIS — Z510 Encounter for antineoplastic radiation therapy: Secondary | ICD-10-CM | POA: Diagnosis not present

## 2022-02-02 ENCOUNTER — Ambulatory Visit
Admission: RE | Admit: 2022-02-02 | Discharge: 2022-02-02 | Disposition: A | Discharge status: Home / Self Care | Payer: HMO | Source: Ambulatory Visit | Attending: Radiation Oncology | Admitting: Radiation Oncology

## 2022-02-02 DIAGNOSIS — C61 Malignant neoplasm of prostate: Secondary | ICD-10-CM | POA: Diagnosis not present

## 2022-02-02 DIAGNOSIS — Z510 Encounter for antineoplastic radiation therapy: Secondary | ICD-10-CM | POA: Diagnosis not present

## 2022-02-03 ENCOUNTER — Other Ambulatory Visit: Payer: Self-pay

## 2022-02-03 ENCOUNTER — Ambulatory Visit
Admission: RE | Admit: 2022-02-03 | Discharge: 2022-02-03 | Disposition: A | Discharge status: Home / Self Care | Payer: HMO | Source: Ambulatory Visit | Attending: Radiation Oncology | Admitting: Radiation Oncology

## 2022-02-03 DIAGNOSIS — Z510 Encounter for antineoplastic radiation therapy: Secondary | ICD-10-CM | POA: Insufficient documentation

## 2022-02-03 DIAGNOSIS — C61 Malignant neoplasm of prostate: Secondary | ICD-10-CM | POA: Insufficient documentation

## 2022-02-04 ENCOUNTER — Ambulatory Visit
Admission: RE | Admit: 2022-02-04 | Discharge: 2022-02-04 | Disposition: A | Discharge status: Home / Self Care | Payer: HMO | Source: Ambulatory Visit | Attending: Radiation Oncology | Admitting: Radiation Oncology

## 2022-02-04 DIAGNOSIS — Z510 Encounter for antineoplastic radiation therapy: Secondary | ICD-10-CM | POA: Diagnosis not present

## 2022-02-04 DIAGNOSIS — C61 Malignant neoplasm of prostate: Secondary | ICD-10-CM | POA: Diagnosis not present

## 2022-02-05 ENCOUNTER — Other Ambulatory Visit: Payer: Self-pay

## 2022-02-05 ENCOUNTER — Ambulatory Visit
Admission: RE | Admit: 2022-02-05 | Discharge: 2022-02-05 | Disposition: A | Discharge status: Home / Self Care | Payer: HMO | Source: Ambulatory Visit | Attending: Radiation Oncology | Admitting: Radiation Oncology

## 2022-02-05 DIAGNOSIS — Z510 Encounter for antineoplastic radiation therapy: Secondary | ICD-10-CM | POA: Diagnosis not present

## 2022-02-05 DIAGNOSIS — C61 Malignant neoplasm of prostate: Secondary | ICD-10-CM | POA: Diagnosis not present

## 2022-02-08 ENCOUNTER — Ambulatory Visit
Admission: RE | Admit: 2022-02-08 | Discharge: 2022-02-08 | Disposition: A | Discharge status: Home / Self Care | Payer: HMO | Source: Ambulatory Visit | Attending: Radiation Oncology | Admitting: Radiation Oncology

## 2022-02-08 ENCOUNTER — Other Ambulatory Visit: Payer: Self-pay

## 2022-02-08 DIAGNOSIS — Z510 Encounter for antineoplastic radiation therapy: Secondary | ICD-10-CM | POA: Diagnosis not present

## 2022-02-08 DIAGNOSIS — C61 Malignant neoplasm of prostate: Secondary | ICD-10-CM | POA: Diagnosis not present

## 2022-02-09 ENCOUNTER — Ambulatory Visit
Admission: RE | Admit: 2022-02-09 | Discharge: 2022-02-09 | Disposition: A | Discharge status: Home / Self Care | Payer: HMO | Source: Ambulatory Visit | Attending: Radiation Oncology | Admitting: Radiation Oncology

## 2022-02-09 DIAGNOSIS — Z510 Encounter for antineoplastic radiation therapy: Secondary | ICD-10-CM | POA: Diagnosis not present

## 2022-02-09 DIAGNOSIS — C61 Malignant neoplasm of prostate: Secondary | ICD-10-CM | POA: Diagnosis not present

## 2022-02-10 ENCOUNTER — Ambulatory Visit
Admission: RE | Admit: 2022-02-10 | Discharge: 2022-02-10 | Disposition: A | Discharge status: Home / Self Care | Payer: HMO | Source: Ambulatory Visit | Attending: Radiation Oncology | Admitting: Radiation Oncology

## 2022-02-10 ENCOUNTER — Other Ambulatory Visit: Payer: Self-pay

## 2022-02-10 DIAGNOSIS — Z510 Encounter for antineoplastic radiation therapy: Secondary | ICD-10-CM | POA: Diagnosis not present

## 2022-02-10 DIAGNOSIS — C61 Malignant neoplasm of prostate: Secondary | ICD-10-CM | POA: Diagnosis not present

## 2022-02-11 ENCOUNTER — Ambulatory Visit
Admission: RE | Admit: 2022-02-11 | Discharge: 2022-02-11 | Disposition: A | Discharge status: Home / Self Care | Payer: HMO | Source: Ambulatory Visit | Attending: Radiation Oncology | Admitting: Radiation Oncology

## 2022-02-11 DIAGNOSIS — C61 Malignant neoplasm of prostate: Secondary | ICD-10-CM | POA: Diagnosis not present

## 2022-02-11 DIAGNOSIS — Z510 Encounter for antineoplastic radiation therapy: Secondary | ICD-10-CM | POA: Diagnosis not present

## 2022-02-12 ENCOUNTER — Ambulatory Visit: Admission: RE | Admit: 2022-02-12 | Payer: HMO | Source: Ambulatory Visit

## 2022-02-12 ENCOUNTER — Other Ambulatory Visit: Payer: Self-pay

## 2022-02-15 ENCOUNTER — Other Ambulatory Visit: Payer: Self-pay | Admitting: Radiation Oncology

## 2022-02-15 ENCOUNTER — Ambulatory Visit
Admission: RE | Admit: 2022-02-15 | Discharge: 2022-02-15 | Disposition: A | Discharge status: Home / Self Care | Payer: HMO | Source: Ambulatory Visit | Attending: Radiation Oncology | Admitting: Radiation Oncology

## 2022-02-15 ENCOUNTER — Other Ambulatory Visit: Payer: Self-pay

## 2022-02-15 DIAGNOSIS — Z510 Encounter for antineoplastic radiation therapy: Secondary | ICD-10-CM | POA: Diagnosis not present

## 2022-02-15 DIAGNOSIS — C61 Malignant neoplasm of prostate: Secondary | ICD-10-CM | POA: Diagnosis not present

## 2022-02-16 ENCOUNTER — Ambulatory Visit
Admission: RE | Admit: 2022-02-16 | Discharge: 2022-02-16 | Disposition: A | Discharge status: Home / Self Care | Payer: HMO | Source: Ambulatory Visit | Attending: Radiation Oncology | Admitting: Radiation Oncology

## 2022-02-16 DIAGNOSIS — Z510 Encounter for antineoplastic radiation therapy: Secondary | ICD-10-CM | POA: Diagnosis not present

## 2022-02-16 DIAGNOSIS — C61 Malignant neoplasm of prostate: Secondary | ICD-10-CM | POA: Diagnosis not present

## 2022-02-17 ENCOUNTER — Ambulatory Visit
Admission: RE | Admit: 2022-02-17 | Discharge: 2022-02-17 | Disposition: A | Discharge status: Home / Self Care | Payer: HMO | Source: Ambulatory Visit | Attending: Radiation Oncology | Admitting: Radiation Oncology

## 2022-02-17 ENCOUNTER — Other Ambulatory Visit: Payer: Self-pay

## 2022-02-17 DIAGNOSIS — Z510 Encounter for antineoplastic radiation therapy: Secondary | ICD-10-CM | POA: Diagnosis not present

## 2022-02-17 DIAGNOSIS — C61 Malignant neoplasm of prostate: Secondary | ICD-10-CM | POA: Diagnosis not present

## 2022-02-18 ENCOUNTER — Ambulatory Visit
Admission: RE | Admit: 2022-02-18 | Discharge: 2022-02-18 | Disposition: A | Discharge status: Home / Self Care | Payer: HMO | Source: Ambulatory Visit | Attending: Radiation Oncology | Admitting: Radiation Oncology

## 2022-02-18 DIAGNOSIS — C61 Malignant neoplasm of prostate: Secondary | ICD-10-CM | POA: Diagnosis not present

## 2022-02-18 DIAGNOSIS — Z510 Encounter for antineoplastic radiation therapy: Secondary | ICD-10-CM | POA: Diagnosis not present

## 2022-02-19 ENCOUNTER — Other Ambulatory Visit: Payer: Self-pay

## 2022-02-19 ENCOUNTER — Ambulatory Visit
Admission: RE | Admit: 2022-02-19 | Discharge: 2022-02-19 | Disposition: A | Discharge status: Home / Self Care | Payer: HMO | Source: Ambulatory Visit | Attending: Radiation Oncology | Admitting: Radiation Oncology

## 2022-02-19 DIAGNOSIS — Z510 Encounter for antineoplastic radiation therapy: Secondary | ICD-10-CM | POA: Diagnosis not present

## 2022-02-19 DIAGNOSIS — C61 Malignant neoplasm of prostate: Secondary | ICD-10-CM | POA: Diagnosis not present

## 2022-02-22 ENCOUNTER — Other Ambulatory Visit: Payer: Self-pay

## 2022-02-22 ENCOUNTER — Ambulatory Visit
Admission: RE | Admit: 2022-02-22 | Discharge: 2022-02-22 | Disposition: A | Discharge status: Home / Self Care | Payer: HMO | Source: Ambulatory Visit | Attending: Radiation Oncology | Admitting: Radiation Oncology

## 2022-02-22 ENCOUNTER — Inpatient Hospital Stay: Payer: HMO

## 2022-02-22 DIAGNOSIS — D62 Acute posthemorrhagic anemia: Secondary | ICD-10-CM | POA: Insufficient documentation

## 2022-02-22 DIAGNOSIS — C61 Malignant neoplasm of prostate: Secondary | ICD-10-CM | POA: Insufficient documentation

## 2022-02-22 DIAGNOSIS — Z510 Encounter for antineoplastic radiation therapy: Secondary | ICD-10-CM | POA: Diagnosis not present

## 2022-02-22 LAB — CBC WITH DIFFERENTIAL (CANCER CENTER ONLY)
Abs Immature Granulocytes: 0 10*3/uL (ref 0.00–0.07)
Basophils Absolute: 0 10*3/uL (ref 0.0–0.1)
Basophils Relative: 0 %
Eosinophils Absolute: 0.3 10*3/uL (ref 0.0–0.5)
Eosinophils Relative: 5 %
HCT: 39.8 % (ref 39.0–52.0)
Hemoglobin: 12.6 g/dL — ABNORMAL LOW (ref 13.0–17.0)
Immature Granulocytes: 0 %
Lymphocytes Relative: 30 %
Lymphs Abs: 1.7 10*3/uL (ref 0.7–4.0)
MCH: 22.7 pg — ABNORMAL LOW (ref 26.0–34.0)
MCHC: 31.7 g/dL (ref 30.0–36.0)
MCV: 71.7 fL — ABNORMAL LOW (ref 80.0–100.0)
Monocytes Absolute: 0.6 10*3/uL (ref 0.1–1.0)
Monocytes Relative: 10 %
Neutro Abs: 3.2 10*3/uL (ref 1.7–7.7)
Neutrophils Relative %: 55 %
Platelet Count: 172 10*3/uL (ref 150–400)
RBC: 5.55 MIL/uL (ref 4.22–5.81)
RDW: 14.8 % (ref 11.5–15.5)
WBC Count: 5.8 10*3/uL (ref 4.0–10.5)
nRBC: 0 % (ref 0.0–0.2)

## 2022-02-22 LAB — FERRITIN: Ferritin: 39 ng/mL (ref 24–336)

## 2022-02-23 ENCOUNTER — Ambulatory Visit
Admission: RE | Admit: 2022-02-23 | Discharge: 2022-02-23 | Disposition: A | Discharge status: Home / Self Care | Payer: HMO | Source: Ambulatory Visit | Attending: Radiation Oncology | Admitting: Radiation Oncology

## 2022-02-23 DIAGNOSIS — C61 Malignant neoplasm of prostate: Secondary | ICD-10-CM | POA: Diagnosis not present

## 2022-02-23 DIAGNOSIS — Z510 Encounter for antineoplastic radiation therapy: Secondary | ICD-10-CM | POA: Diagnosis not present

## 2022-02-24 ENCOUNTER — Ambulatory Visit
Admission: RE | Admit: 2022-02-24 | Discharge: 2022-02-24 | Disposition: A | Discharge status: Home / Self Care | Payer: HMO | Source: Ambulatory Visit | Attending: Radiation Oncology | Admitting: Radiation Oncology

## 2022-02-24 ENCOUNTER — Other Ambulatory Visit: Payer: Self-pay

## 2022-02-24 DIAGNOSIS — Z510 Encounter for antineoplastic radiation therapy: Secondary | ICD-10-CM | POA: Diagnosis not present

## 2022-02-24 DIAGNOSIS — C61 Malignant neoplasm of prostate: Secondary | ICD-10-CM | POA: Diagnosis not present

## 2022-02-25 ENCOUNTER — Ambulatory Visit
Admission: RE | Admit: 2022-02-25 | Discharge: 2022-02-25 | Disposition: A | Discharge status: Home / Self Care | Payer: HMO | Source: Ambulatory Visit | Attending: Radiation Oncology | Admitting: Radiation Oncology

## 2022-02-25 DIAGNOSIS — C61 Malignant neoplasm of prostate: Secondary | ICD-10-CM | POA: Diagnosis not present

## 2022-02-25 DIAGNOSIS — Z510 Encounter for antineoplastic radiation therapy: Secondary | ICD-10-CM | POA: Diagnosis not present

## 2022-02-26 ENCOUNTER — Ambulatory Visit
Admission: RE | Admit: 2022-02-26 | Discharge: 2022-02-26 | Disposition: A | Discharge status: Home / Self Care | Payer: HMO | Source: Ambulatory Visit | Attending: Radiation Oncology | Admitting: Radiation Oncology

## 2022-02-26 ENCOUNTER — Other Ambulatory Visit: Payer: Self-pay

## 2022-02-26 DIAGNOSIS — Z510 Encounter for antineoplastic radiation therapy: Secondary | ICD-10-CM | POA: Diagnosis not present

## 2022-02-26 DIAGNOSIS — C61 Malignant neoplasm of prostate: Secondary | ICD-10-CM | POA: Diagnosis not present

## 2022-03-01 ENCOUNTER — Ambulatory Visit
Admission: RE | Admit: 2022-03-01 | Discharge: 2022-03-01 | Disposition: A | Discharge status: Home / Self Care | Payer: HMO | Source: Ambulatory Visit | Attending: Radiation Oncology | Admitting: Radiation Oncology

## 2022-03-01 ENCOUNTER — Ambulatory Visit: Payer: HMO

## 2022-03-01 DIAGNOSIS — C61 Malignant neoplasm of prostate: Secondary | ICD-10-CM | POA: Diagnosis not present

## 2022-03-01 DIAGNOSIS — Z510 Encounter for antineoplastic radiation therapy: Secondary | ICD-10-CM | POA: Diagnosis not present

## 2022-03-02 ENCOUNTER — Encounter: Payer: Self-pay | Admitting: Urology

## 2022-03-02 ENCOUNTER — Other Ambulatory Visit: Payer: Self-pay

## 2022-03-02 ENCOUNTER — Ambulatory Visit
Admission: RE | Admit: 2022-03-02 | Discharge: 2022-03-02 | Disposition: A | Discharge status: Home / Self Care | Payer: HMO | Source: Ambulatory Visit | Attending: Radiation Oncology | Admitting: Radiation Oncology

## 2022-03-02 DIAGNOSIS — C61 Malignant neoplasm of prostate: Secondary | ICD-10-CM | POA: Diagnosis not present

## 2022-03-02 DIAGNOSIS — Z510 Encounter for antineoplastic radiation therapy: Secondary | ICD-10-CM | POA: Diagnosis not present

## 2022-03-18 DIAGNOSIS — E119 Type 2 diabetes mellitus without complications: Secondary | ICD-10-CM | POA: Diagnosis not present

## 2022-03-18 DIAGNOSIS — H2513 Age-related nuclear cataract, bilateral: Secondary | ICD-10-CM | POA: Diagnosis not present

## 2022-03-18 DIAGNOSIS — H35413 Lattice degeneration of retina, bilateral: Secondary | ICD-10-CM | POA: Diagnosis not present

## 2022-03-18 DIAGNOSIS — H52223 Regular astigmatism, bilateral: Secondary | ICD-10-CM | POA: Diagnosis not present

## 2022-03-18 DIAGNOSIS — H35033 Hypertensive retinopathy, bilateral: Secondary | ICD-10-CM | POA: Diagnosis not present

## 2022-03-18 DIAGNOSIS — H5213 Myopia, bilateral: Secondary | ICD-10-CM | POA: Diagnosis not present

## 2022-03-18 DIAGNOSIS — H524 Presbyopia: Secondary | ICD-10-CM | POA: Diagnosis not present

## 2022-03-18 LAB — HM DIABETES EYE EXAM

## 2022-03-22 ENCOUNTER — Telehealth: Payer: Self-pay | Admitting: *Deleted

## 2022-03-22 DIAGNOSIS — I2510 Atherosclerotic heart disease of native coronary artery without angina pectoris: Secondary | ICD-10-CM

## 2022-03-22 DIAGNOSIS — Z0189 Encounter for other specified special examinations: Secondary | ICD-10-CM

## 2022-03-22 DIAGNOSIS — I7141 Pararenal abdominal aortic aneurysm, without rupture: Secondary | ICD-10-CM

## 2022-03-22 NOTE — Telephone Encounter (Signed)
-----   Message from Livingston Diones, [place] sent at 03/22/2022 11:23 AM EDT ----- ?Regarding: BMET NEEDED ?Hey guys, can you please place a BMET order for this patient and get him set up?  ? ?He has a CT on 4/19 ? ?Thanks! ?Nunzio Cobbs  ? ?

## 2022-03-22 NOTE — Telephone Encounter (Signed)
BMET order placed as requested by CT Dept, and per CT protocol. ?Will make CT Scheduler aware that lab order placed. ?

## 2022-03-23 ENCOUNTER — Other Ambulatory Visit: Payer: HMO

## 2022-03-23 DIAGNOSIS — I7141 Pararenal abdominal aortic aneurysm, without rupture: Secondary | ICD-10-CM | POA: Diagnosis not present

## 2022-03-23 DIAGNOSIS — Z0189 Encounter for other specified special examinations: Secondary | ICD-10-CM

## 2022-03-23 DIAGNOSIS — I2510 Atherosclerotic heart disease of native coronary artery without angina pectoris: Secondary | ICD-10-CM

## 2022-03-24 ENCOUNTER — Ambulatory Visit (INDEPENDENT_AMBULATORY_CARE_PROVIDER_SITE_OTHER)
Admission: RE | Admit: 2022-03-24 | Discharge: 2022-03-24 | Disposition: A | Discharge status: Home / Self Care | Payer: HMO | Source: Ambulatory Visit | Attending: Cardiology | Admitting: Cardiology

## 2022-03-24 DIAGNOSIS — I714 Abdominal aortic aneurysm, without rupture, unspecified: Secondary | ICD-10-CM

## 2022-03-24 DIAGNOSIS — I7121 Aneurysm of the ascending aorta, without rupture: Secondary | ICD-10-CM | POA: Diagnosis not present

## 2022-03-24 LAB — BASIC METABOLIC PANEL
BUN/Creatinine Ratio: 20 (ref 10–24)
BUN: 25 mg/dL (ref 8–27)
CO2: 23 mmol/L (ref 20–29)
Calcium: 9.4 mg/dL (ref 8.6–10.2)
Chloride: 99 mmol/L (ref 96–106)
Creatinine, Ser: 1.25 mg/dL (ref 0.76–1.27)
Glucose: 75 mg/dL (ref 70–99)
Potassium: 4.8 mmol/L (ref 3.5–5.2)
Sodium: 140 mmol/L (ref 134–144)
eGFR: 63 mL/min/{1.73_m2} (ref >59)

## 2022-03-24 MED ORDER — IOHEXOL 350 MG/ML SOLN
100.0000 mL | Freq: Once | INTRAVENOUS | Status: AC | PRN
  Administered 2022-03-24: 100 mL via INTRAVENOUS

## 2022-03-25 ENCOUNTER — Telehealth: Payer: Self-pay | Admitting: *Deleted

## 2022-03-25 DIAGNOSIS — I7141 Pararenal abdominal aortic aneurysm, without rupture: Secondary | ICD-10-CM

## 2022-03-25 NOTE — Telephone Encounter (Signed)
-----   Message from Freada Bergeron, MD sent at 03/25/2022 10:47 AM EDT ----- ?His CT scan looks stable. Aorta measures 4.1cm, which is only mildly dilated. We will continue to monitor with yearly CTA scans. ?

## 2022-03-25 NOTE — Telephone Encounter (Signed)
The patient has been notified of the result and verbalized understanding.  All questions (if any) were answered. ? ?Pt aware I will go ahead and place the order for him to have a repeat CT ANGIO CHEST AORTA in one year in the system and send a message to our CT Scheduler to call him back and arrange.  ?Future bmet also placed for that time.  ?Pt verbalized understanding and agrees with this plan. ? ?

## 2022-03-26 ENCOUNTER — Ambulatory Visit (INDEPENDENT_AMBULATORY_CARE_PROVIDER_SITE_OTHER): Payer: HMO | Admitting: Internal Medicine

## 2022-03-26 ENCOUNTER — Encounter: Payer: Self-pay | Admitting: Internal Medicine

## 2022-03-26 VITALS — BP 120/68 | HR 84 | Temp 98.1°F | Ht 74.0 in | Wt 246.0 lb

## 2022-03-26 DIAGNOSIS — E538 Deficiency of other specified B group vitamins: Secondary | ICD-10-CM | POA: Diagnosis not present

## 2022-03-26 DIAGNOSIS — N1831 Chronic kidney disease, stage 3a: Secondary | ICD-10-CM | POA: Diagnosis not present

## 2022-03-26 DIAGNOSIS — E559 Vitamin D deficiency, unspecified: Secondary | ICD-10-CM | POA: Diagnosis not present

## 2022-03-26 DIAGNOSIS — Z0001 Encounter for general adult medical examination with abnormal findings: Secondary | ICD-10-CM

## 2022-03-26 DIAGNOSIS — R1013 Epigastric pain: Secondary | ICD-10-CM

## 2022-03-26 DIAGNOSIS — E1122 Type 2 diabetes mellitus with diabetic chronic kidney disease: Secondary | ICD-10-CM

## 2022-03-26 DIAGNOSIS — K645 Perianal venous thrombosis: Secondary | ICD-10-CM | POA: Diagnosis not present

## 2022-03-26 DIAGNOSIS — N183 Chronic kidney disease, stage 3 unspecified: Secondary | ICD-10-CM

## 2022-03-26 DIAGNOSIS — R634 Abnormal weight loss: Secondary | ICD-10-CM

## 2022-03-26 LAB — LIPID PANEL
Cholesterol: 91 mg/dL (ref 0–200)
HDL: 41 mg/dL (ref >39.00)
LDL Cholesterol: 39 mg/dL (ref 0–99)
NonHDL: 49.52
Total CHOL/HDL Ratio: 2
Triglycerides: 55 mg/dL (ref 0.0–149.0)
VLDL: 11 mg/dL (ref 0.0–40.0)

## 2022-03-26 LAB — HEPATIC FUNCTION PANEL
ALT: 28 U/L (ref 0–53)
AST: 20 U/L (ref 0–37)
Albumin: 4.4 g/dL (ref 3.5–5.2)
Alkaline Phosphatase: 67 U/L (ref 39–117)
Bilirubin, Direct: 0.2 mg/dL (ref 0.0–0.3)
Total Bilirubin: 0.6 mg/dL (ref 0.2–1.2)
Total Protein: 7 g/dL (ref 6.0–8.3)

## 2022-03-26 LAB — VITAMIN D 25 HYDROXY (VIT D DEFICIENCY, FRACTURES): VITD: 50.2 ng/mL (ref 30.00–100.00)

## 2022-03-26 LAB — HEMOGLOBIN A1C: Hgb A1c MFr Bld: 7.3 % — ABNORMAL HIGH (ref 4.6–6.5)

## 2022-03-26 LAB — VITAMIN B12: Vitamin B-12: >1504 pg/mL — ABNORMAL HIGH (ref 211–911)

## 2022-03-26 LAB — TSH: TSH: 1.8 u[IU]/mL (ref 0.35–5.50)

## 2022-03-26 MED ORDER — SUCRALFATE 1 G PO TABS: 1.0000 g | ORAL_TABLET | Freq: Three times a day (TID) | ORAL | 0 refills | Status: DC

## 2022-03-26 MED ORDER — LIDOCAINE-HYDROCORT (PERIANAL) 3-0.5 % EX CREA: TOPICAL_CREAM | CUTANEOUS | 1 refills | Status: DC

## 2022-03-26 NOTE — Patient Instructions (Addendum)
Please consider the shingles shot if covered by your insurance. ? ?Please take all new medication as prescribed - the cream for the hemorrhoids as needed ? ?Please take all new medication as prescribed - the carafate for the stomach ? ?Please continue all other medications as before, and refills have been done if requested. ? ?Please have the pharmacy call with any other refills you may need. ? ?Please continue your efforts at being more active, low cholesterol diet, and weight control. ? ?You are otherwise up to date with prevention measures today. ? ?Please keep your appointments with your specialists as you may have planned ? ?You will be contacted regarding the referral for: Dr Bryan Lemma ? ?Please go to the LAB at the blood drawing area for the tests to be done ? ?You will be contacted by phone if any changes need to be made immediately.  Otherwise, you will receive a letter about your results with an explanation, but please check with MyChart first. ? ?Please remember to sign up for MyChart if you have not done so, as this will be important to you in the future with finding out test results, communicating by private email, and scheduling acute appointments online when needed. ? ?Please make an Appointment to return in 3 months, or sooner if needed ?

## 2022-03-26 NOTE — Progress Notes (Signed)
Patient ID: [name], male   DOB: [birth year], 66 y.o.   MRN: [id number] ? ? ? ?     Chief Complaint:: wellness exam and Office Visit (Possible hemorrhoids and nausea with eating) ? , ckd, dm, b12 low ? ?     HPI:  [name] is a 66 y.o. male here for wellness exam; decline covid booster, shingrix o/w up to date ?         ?              Also c/o epigastric pain and nausea discomfort, mild to mod, constant for 3-4 days, no vomiting and Denies worsening reflux,, dysphagia, bowel change or blood. Pt denies chest pain, increased sob or doe, wheezing, orthopnea, PND, increased LE swelling, palpitations, dizziness or syncope.   Pt denies polydipsia, polyuria, or new focal neuro s/s.    Pt denies fever, night sweats, loss of appetite, or other constitutional symptoms  Also has active painful moderate hemorrhoid without bleeding x 8 days, feels like a bump and hard to sit up straight, No prior hx of same.  Has also lost wt about 15 lbs since jan 2023 for unclear reason.   ?Wt Readings from Last 3 Encounters:  ?03/26/22 246 lb (111.6 kg)  ?01/05/22 259 lb (117.5 kg)  ?12/24/21 258 lb 8 oz (117.3 kg)  ? ?BP Readings from Last 3 Encounters:  ?03/28/22 136/81  ?03/26/22 120/68  ?01/05/22 (!) 142/84  ? ?Immunization History  ?Administered Date(s) Administered  ? Fluad Quad(high Dose 65+) 10/05/2019, 10/07/2020, 09/14/2021  ? Influenza Split 08/19/2011, 10/30/2012  ? Influenza Whole 09/21/2005, 09/02/2008, 12/24/2009  ? Influenza,inj,Quad PF,6+ Mos 09/13/2013, 09/30/2014, 10/29/2015, 11/15/2016, 09/23/2017, 09/19/2018  ? PFIZER Comirnaty(Gray Top)Covid-19 Tri-Sucrose Vaccine 07/09/2021  ? PFIZER(Purple Top)SARS-COV-2 Vaccination 01/03/2020, 01/27/2020, 10/31/2020  ? Pneumococcal Conjugate-13 03/13/2014  ? Pneumococcal Polysaccharide-23 03/04/2008, 06/28/2016, 11/28/2018  ? Td 03/11/2009  ? Tdap 07/13/2019  ? Zoster, Live 10/29/2015  ? ?Health Maintenance Due  ?Topic Date Due  ? URINE MICROALBUMIN  03/10/2022  ? ?   ? ?Past Medical History:  ?Diagnosis Date  ? Arthritis   ? BACK PAIN   ? CAD (coronary artery disease) 06/28/2017  ? CAD in native artery   ? a. moderate by cardiac CT 2016.  ? Chronic bilateral low back pain with bilateral sciatica 01/12/2019  ? Degenerative arthritis of right knee 07/15/2016  ? Injected in 07/15/2016 Orthovisc injection started 10/07/2016 DepoMedrol 40 inj on 04/11/17, 25 cc aspirated Orthovisc injection in 05/30/2017    ? DEGENERATIVE JOINT DISEASE, KNEES, BILATERAL   ? Depression   ? Diabetes ([employer]) 10/01/2014  ? Diabetes mellitus   ? type II  ? DIVERTICULOSIS, COLON   ? DVT, lower extremity ([place])   ? right  ? History of kidney stones   ? Hyperlipidemia   ? Hypertension   ? HYPOGONADISM, MALE   ? HYPOTENSION, ORTHOSTATIC   ? Kidney stones   ? Long term (current) use of anticoagulants 08/17/2017  ? Long term current use of anticoagulant   ? Lumbosacral radiculopathy at S1 06/14/2012  ? Maxillary sinus cyst 12/11/2018  ? Obesity   ? OBESITY, MORBID 03/04/2008  ? Qualifier: Diagnosis of  By: Jenny Reichmann MD, Hunt Oris   ? OSA (obstructive sleep apnea) 09/11/2013  ? Pulmonary emboli ([place])   ? Pulmonary embolism ([place]) 02/05/2011  ? RBBB 07/28/2017  ? Renal insufficiency   ? a. prior h/o, does not appear chronic.  ? Superficial phlebitis of arm 03/18/2016  ?  Vitamin D deficiency 07/09/2020  ? ?Past Surgical History:  ?Procedure Laterality Date  ? ankle surgury  2001 bilateral  ? with bone spurs and torn tendon  ? COLONOSCOPY  03/18/2010  ? EDG  2008  ? chronic Duodenitis  ? FLEXIBLE SIGMOIDOSCOPY N/A 11/03/2021  ? Procedure: FLEXIBLE SIGMOIDOSCOPY;  Surgeon: Doran Stabler, MD;  Location: [facility];  Service: Gastroenterology;  Laterality: N/A;  ? GASTRIC BYPASS    ? HEEL SPUR SURGERY Bilateral 2005  ? IR ANGIOGRAM PELVIS SELECTIVE OR SUPRASELECTIVE  11/02/2021  ? IR ANGIOGRAM PELVIS SELECTIVE OR SUPRASELECTIVE  11/04/2021  ? IR ANGIOGRAM SELECTIVE EACH ADDITIONAL VESSEL  11/02/2021  ? IR ANGIOGRAM SELECTIVE EACH  ADDITIONAL VESSEL  11/02/2021  ? IR ANGIOGRAM SELECTIVE EACH ADDITIONAL VESSEL  11/02/2021  ? IR ANGIOGRAM SELECTIVE EACH ADDITIONAL VESSEL  11/04/2021  ? IR ANGIOGRAM SELECTIVE EACH ADDITIONAL VESSEL  11/04/2021  ? IR ANGIOGRAM SELECTIVE EACH ADDITIONAL VESSEL  11/04/2021  ? IR ANGIOGRAM SELECTIVE EACH ADDITIONAL VESSEL  11/04/2021  ? IR ANGIOGRAM VISCERAL SELECTIVE  11/02/2021  ? IR ANGIOGRAM VISCERAL SELECTIVE  11/04/2021  ? IR EMBO ART  VEN HEMORR LYMPH EXTRAV  INC GUIDE ROADMAPPING  11/02/2021  ? IR EMBO ART  VEN HEMORR LYMPH EXTRAV  INC GUIDE ROADMAPPING  11/04/2021  ? IR EMBO ART  VEN HEMORR LYMPH EXTRAV  INC GUIDE ROADMAPPING  11/04/2021  ? IR US GUIDE VASC ACCESS RIGHT  11/02/2021  ? IR US GUIDE VASC ACCESS RIGHT  11/04/2021  ? knee surgury  2000 & 2003   ? cartilage damage  ? LEFT HEART CATH AND CORONARY ANGIOGRAPHY N/A 02/16/2021  ? Procedure: LEFT HEART CATH AND CORONARY ANGIOGRAPHY;  Surgeon: Sherren Mocha, MD;  Location: [facility];  Service: Cardiovascular;  Laterality: N/A;  ? LUMBAR LAMINECTOMY/DECOMPRESSION MICRODISCECTOMY  09/28/2012  ? Procedure: LUMBAR LAMINECTOMY/DECOMPRESSION MICRODISCECTOMY 2 LEVELS;  Surgeon: [name], MD;  Location: [facility];  Service: Orthopedics;  Laterality: Left;  Decompressive laminectomy L4-L5. Microdiscectomy L5-S1  ? Skin removal surgery    ? extra abdominal skin removed from his weight loss  ? TONSILLECTOMY AND ADENOIDECTOMY  age 56 or 44  ? TOTAL KNEE ARTHROPLASTY  01/10/2012  ? Procedure: TOTAL KNEE ARTHROPLASTY;  Surgeon: [name], MD;  Location: [facility];  Service: Orthopedics;  Laterality: Left;  ? TOTAL KNEE ARTHROPLASTY Right 11/18/2020  ? Procedure: TOTAL KNEE ARTHROPLASTY;  Surgeon: Paralee Cancel, MD;  Location: [facility];  Service: Orthopedics;  Laterality: Right;  70 mins  ? UPPER GASTROINTESTINAL ENDOSCOPY  03/18/2010  ? ? reports that he quit smoking about 43 years ago. His smoking use included cigarettes. He has a 1.25 pack-year smoking  history. He has never used smokeless tobacco. He reports current alcohol use of about 2.0 standard drinks per week. He reports that he does not use drugs. ?family history includes Diabetes in his brother, sister, sister, and sister; Heart failure in his sister; Hypertension in his mother; Prostate cancer in his father. ?Allergies  ?Allergen Reactions  ? Adhesive [Tape] Other (See Comments)  ?  Painful, Please use "paper" tape  ? Other   ?  NO BLOOD -JEHOVAH'S WITNESS-SIGNED REFUSAL BUT WOULD TAKE ALBUMIN  ? ?Current Outpatient Medications on File Prior to Visit  ?Medication Sig Dispense Refill  ? Artificial Tear Ointment (ARTIFICIAL TEARS) ointment Place 1 drop into both eyes every 4 (four) hours as needed (dry eyes).     ? atorvastatin (LIPITOR) 80 MG tablet TAKE 1 TABLET BY  MOUTH EVERY DAY 90 tablet 2  ? chlorthalidone (HYGROTON) 25 MG tablet Take 25 mg by mouth daily.    ? Cholecalciferol (VITAMIN D3) 125 MCG (5000 UT) CAPS Take 5,000 Units by mouth daily.     ? diazepam (VALIUM) 5 MG tablet One tab by mouth, 2 hours before procedure. 2 tablet 0  ? ferrous sulfate 325 (65 FE) MG EC tablet TAKE 1 TABLET BY MOUTH EVERY DAY 90 tablet 1  ? folic acid (FOLVITE) 1 MG tablet TAKE 1 TABLET BY MOUTH EVERY DAY 90 tablet 1  ? gabapentin (NEURONTIN) 100 MG capsule TAKE 2 CAPSULES BY MOUTH AT BEDTIME 60 capsule 0  ? glipiZIDE (GLUCOTROL XL) 2.5 MG 24 hr tablet TAKE 1 TABLET BY MOUTH DAILY WITH BREAKFAST. 90 tablet 3  ? metFORMIN (GLUCOPHAGE) 500 MG tablet TAKE 4 TABLETS BY MOUTH DAILY WITH BREAKFAST. 360 tablet 3  ? Multiple Vitamin (MULTIVITAMIN WITH MINERALS) TABS tablet Take 1 tablet by mouth daily at 12 noon.    ? nitroGLYCERIN (NITROSTAT) 0.4 MG SL tablet Place 1 tablet (0.4 mg total) under the tongue every 5 (five) minutes as needed for chest pain. 90 tablet 3  ? vitamin B-12 1000 MCG tablet Take 1 tablet (1,000 mcg total) by mouth daily. 30 tablet 1  ? ?No current facility-administered medications on file prior to  visit.  ? ?     ROS:  All others reviewed and negative. ? ?Objective  ? ?     PE:  BP 120/68 (BP Location: Left Arm, Patient Position: Sitting, Cuff Size: Large)   Pulse 84   Temp 98.1 ?F (36.7 ?C) (Oral)   Ht '6\' 2"'$  (

## 2022-03-27 ENCOUNTER — Other Ambulatory Visit: Payer: Self-pay | Admitting: Internal Medicine

## 2022-03-28 ENCOUNTER — Encounter (HOSPITAL_COMMUNITY): Payer: Self-pay | Admitting: Emergency Medicine

## 2022-03-28 ENCOUNTER — Encounter: Payer: Self-pay | Admitting: Nurse Practitioner

## 2022-03-28 ENCOUNTER — Emergency Department (HOSPITAL_COMMUNITY): Payer: HMO

## 2022-03-28 ENCOUNTER — Emergency Department (HOSPITAL_COMMUNITY)
Admission: EM | Admit: 2022-03-28 | Discharge: 2022-03-28 | Disposition: A | Discharge status: Home / Self Care | Payer: HMO | Attending: Emergency Medicine | Admitting: Emergency Medicine

## 2022-03-28 DIAGNOSIS — U071 COVID-19: Secondary | ICD-10-CM | POA: Diagnosis not present

## 2022-03-28 DIAGNOSIS — I2510 Atherosclerotic heart disease of native coronary artery without angina pectoris: Secondary | ICD-10-CM | POA: Insufficient documentation

## 2022-03-28 DIAGNOSIS — E119 Type 2 diabetes mellitus without complications: Secondary | ICD-10-CM | POA: Insufficient documentation

## 2022-03-28 DIAGNOSIS — I119 Hypertensive heart disease without heart failure: Secondary | ICD-10-CM | POA: Insufficient documentation

## 2022-03-28 DIAGNOSIS — Z8546 Personal history of malignant neoplasm of prostate: Secondary | ICD-10-CM | POA: Insufficient documentation

## 2022-03-28 DIAGNOSIS — Z79899 Other long term (current) drug therapy: Secondary | ICD-10-CM | POA: Diagnosis not present

## 2022-03-28 DIAGNOSIS — R001 Bradycardia, unspecified: Secondary | ICD-10-CM | POA: Diagnosis not present

## 2022-03-28 DIAGNOSIS — Z7984 Long term (current) use of oral hypoglycemic drugs: Secondary | ICD-10-CM | POA: Diagnosis not present

## 2022-03-28 DIAGNOSIS — R0602 Shortness of breath: Secondary | ICD-10-CM | POA: Diagnosis not present

## 2022-03-28 LAB — BASIC METABOLIC PANEL
Anion gap: 5 (ref 5–15)
BUN: 28 mg/dL — ABNORMAL HIGH (ref 8–23)
CO2: 29 mmol/L (ref 22–32)
Calcium: 9.1 mg/dL (ref 8.9–10.3)
Chloride: 103 mmol/L (ref 98–111)
Creatinine, Ser: 1.08 mg/dL (ref 0.61–1.24)
GFR, Estimated: >60 mL/min (ref >60)
Glucose, Bld: 128 mg/dL — ABNORMAL HIGH (ref 70–99)
Potassium: 4.4 mmol/L (ref 3.5–5.1)
Sodium: 137 mmol/L (ref 135–145)

## 2022-03-28 LAB — RESP PANEL BY RT-PCR (FLU A&B, COVID) ARPGX2
Influenza A by PCR: NEGATIVE
Influenza B by PCR: NEGATIVE
SARS Coronavirus 2 by RT PCR: POSITIVE — AB

## 2022-03-28 LAB — CBC WITH DIFFERENTIAL/PLATELET
Abs Immature Granulocytes: 0.01 10*3/uL (ref 0.00–0.07)
Basophils Absolute: 0 10*3/uL (ref 0.0–0.1)
Basophils Relative: 0 %
Eosinophils Absolute: 0.3 10*3/uL (ref 0.0–0.5)
Eosinophils Relative: 5 %
HCT: 37.2 % — ABNORMAL LOW (ref 39.0–52.0)
Hemoglobin: 11.9 g/dL — ABNORMAL LOW (ref 13.0–17.0)
Immature Granulocytes: 0 %
Lymphocytes Relative: 17 %
Lymphs Abs: 0.9 10*3/uL (ref 0.7–4.0)
MCH: 22.9 pg — ABNORMAL LOW (ref 26.0–34.0)
MCHC: 32 g/dL (ref 30.0–36.0)
MCV: 71.7 fL — ABNORMAL LOW (ref 80.0–100.0)
Monocytes Absolute: 0.6 10*3/uL (ref 0.1–1.0)
Monocytes Relative: 11 %
Neutro Abs: 3.7 10*3/uL (ref 1.7–7.7)
Neutrophils Relative %: 67 %
Platelets: 189 10*3/uL (ref 150–400)
RBC: 5.19 MIL/uL (ref 4.22–5.81)
RDW: 15.8 % — ABNORMAL HIGH (ref 11.5–15.5)
WBC: 5.5 10*3/uL (ref 4.0–10.5)
nRBC: 0 % (ref 0.0–0.2)

## 2022-03-28 MED ORDER — NIRMATRELVIR/RITONAVIR (PAXLOVID)TABLET: 3.0000 | ORAL_TABLET | Freq: Two times a day (BID) | ORAL | 0 refills | Status: AC

## 2022-03-28 MED ORDER — BENZONATATE 100 MG PO CAPS: 100.0000 mg | ORAL_CAPSULE | Freq: Three times a day (TID) | ORAL | 0 refills | Status: DC

## 2022-03-28 MED ORDER — BENZONATATE 100 MG PO CAPS
100.0000 mg | ORAL_CAPSULE | Freq: Once | ORAL | Status: AC
Start: 2022-03-28 — End: 2022-03-28
  Administered 2022-03-28: 100 mg via ORAL
  Filled 2022-03-28: qty 1

## 2022-03-28 NOTE — Discharge Instructions (Signed)
Recommendations for at home COVID-19 symptoms management:  ?Please continue isolation at home. ?Call [phone] to see whether you might be eligible for therapeutic antibody infusions (leave your name and they will call you back).  ?If have acute worsening of symptoms please go to ER/urgent care for further evaluation. ?Check pulse oximetry and if below 90-92% please go to ER. ?The following supplements MAY help:  ?Vitamin C '500mg'$  twice a day and Quercetin 250-500 mg twice a day ?Vitamin D3 2000 - 4000 u/day ?B Complex vitamins ?Zinc 75-100 mg/day ?Melatonin 6-10 mg at night (the optimal dose is unknown) ?Aspirin '81mg'$ /day (if no history of bleeding issues) ? ?

## 2022-03-28 NOTE — ED Provider Notes (Signed)
?[facility] DEPT ?Provider Note ? ? ?CSN: [id number] ?Arrival date & time: 03/28/22  0701 ? ?  ? ?History ? ?Chief Complaint  ?Patient presents with  ? Covid Positive  ? ? ?[name] is a 66 y.o. male. ? ?The history is provided by the patient and medical records. No language interpreter was used.  ? ?66 year old male significant history of hypertension, diabetes, PE, CAD, prostate cancer, presenting complaining of shortness of breath.  Patient states since yesterday he has had some nonproductive cough, congestion, runny nose, itchy throat.  He perform at home COVID test and it came back positive.  He does not endorse any fever, body aches, nausea vomiting diarrhea, abdominal pain or dysuria.  He denies any recent sick contact.  He has been covid vaccinated without recent booster. ? ?Home Medications ?Prior to Admission medications   ?Medication Sig Start Date End Date Taking? Authorizing Provider  ?Artificial Tear Ointment (ARTIFICIAL TEARS) ointment Place 1 drop into both eyes every 4 (four) hours as needed (dry eyes).     [provider]  ?atorvastatin (LIPITOR) 80 MG tablet TAKE 1 TABLET BY MOUTH EVERY DAY 12/09/21   Biagio Borg, MD  ?chlorthalidone (HYGROTON) 25 MG tablet Take 25 mg by mouth daily. 11/21/21   [provider]  ?Cholecalciferol (VITAMIN D3) 125 MCG (5000 UT) CAPS Take 5,000 Units by mouth daily.     [provider]  ?diazepam (VALIUM) 5 MG tablet One tab by mouth, 2 hours before procedure. 12/02/21   Lyndal Pulley, DO  ?escitalopram (LEXAPRO) 10 MG tablet TAKE 1 TABLET BY MOUTH EVERY DAY 03/27/22   Biagio Borg, MD  ?ferrous sulfate 325 (65 FE) MG EC tablet TAKE 1 TABLET BY MOUTH EVERY DAY 12/02/21   Alla Feeling, NP  ?folic acid (FOLVITE) 1 MG tablet TAKE 1 TABLET BY MOUTH EVERY DAY 12/02/21   Alla Feeling, NP  ?gabapentin (NEURONTIN) 100 MG capsule TAKE 2 CAPSULES BY MOUTH AT BEDTIME 12/07/21   Lyndal Pulley, DO  ?glipiZIDE  (GLUCOTROL XL) 2.5 MG 24 hr tablet TAKE 1 TABLET BY MOUTH DAILY WITH BREAKFAST. 11/30/21   Biagio Borg, MD  ?Lidocaine-Hydrocort, Perianal, 3-0.5 % CREA Use as directed three times per day as needed 03/26/22   Biagio Borg, MD  ?metFORMIN (GLUCOPHAGE) 500 MG tablet TAKE 4 TABLETS BY MOUTH DAILY WITH BREAKFAST. 11/30/21   Biagio Borg, MD  ?Multiple Vitamin (MULTIVITAMIN WITH MINERALS) TABS tablet Take 1 tablet by mouth daily at 12 noon.    [provider]  ?nitroGLYCERIN (NITROSTAT) 0.4 MG SL tablet Place 1 tablet (0.4 mg total) under the tongue every 5 (five) minutes as needed for chest pain. 07/30/15   Dorothy Spark, MD  ?pantoprazole (PROTONIX) 40 MG tablet TAKE 1 TABLET BY MOUTH EVERY DAY 03/27/22   Biagio Borg, MD  ?sucralfate (CARAFATE) 1 g tablet Take 1 tablet (1 g total) by mouth 4 (four) times daily -  with meals and at bedtime. 03/26/22   Biagio Borg, MD  ?vitamin B-12 1000 MCG tablet Take 1 tablet (1,000 mcg total) by mouth daily. 11/08/21   Hosie Poisson, MD  ?   ? ?Allergies    ?Adhesive [tape] and Other   ? ?Review of Systems   ?Review of Systems  ?All other systems reviewed and are negative. ? ?Physical Exam ?Updated Vital Signs ?BP 137/76   Pulse 66   Temp 97.7 ?F (36.5 ?C) (Oral)  Resp 18   SpO2 95%  ?Physical Exam ?Vitals and nursing note reviewed.  ?Constitutional:   ?   General: He is not in acute distress. ?   Appearance: He is well-developed.  ?HENT:  ?   Head: Atraumatic.  ?Eyes:  ?   Conjunctiva/sclera: Conjunctivae normal.  ?Cardiovascular:  ?   Rate and Rhythm: Normal rate and regular rhythm.  ?   Pulses: Normal pulses.  ?   Heart sounds: Normal heart sounds.  ?Pulmonary:  ?   Effort: Pulmonary effort is normal.  ?   Breath sounds: Normal breath sounds. No wheezing, rhonchi or rales.  ?Abdominal:  ?   Palpations: Abdomen is soft.  ?   Tenderness: There is no abdominal tenderness.  ?Musculoskeletal:  ?   Cervical back: Neck supple.  ?   Right lower leg: No edema.  ?    Left lower leg: No edema.  ?Skin: ?   Findings: No rash.  ?Neurological:  ?   Mental Status: He is alert and oriented to person, place, and time.  ?Psychiatric:     ?   Mood and Affect: Mood normal.  ? ? ?ED Results / Procedures / Treatments   ?Labs ?(all labs ordered are listed, but only abnormal results are displayed) ?Labs Reviewed  ?RESP PANEL BY RT-PCR (FLU A&B, COVID) ARPGX2 - Abnormal; Notable for the following components:  ?    Result Value  ? SARS Coronavirus 2 by RT PCR POSITIVE (*)   ? All other components within normal limits  ?BASIC METABOLIC PANEL - Abnormal; Notable for the following components:  ? Glucose, Bld 128 (*)   ? BUN 28 (*)   ? All other components within normal limits  ?CBC WITH DIFFERENTIAL/PLATELET - Abnormal; Notable for the following components:  ? Hemoglobin 11.9 (*)   ? HCT 37.2 (*)   ? MCV 71.7 (*)   ? MCH 22.9 (*)   ? RDW 15.8 (*)   ? All other components within normal limits  ? ? ?EKG ?EKG Interpretation ? ?Date/Time:  Sunday March 28 2022 08:10:51 EDT ?Ventricular Rate:  54 ?PR Interval:  166 ?QRS Duration: 154 ?QT Interval:  446 ?QTC Calculation: 423 ?R Axis:   76 ?Text Interpretation: Sinus rhythm Right bundle branch block No significant change since last tracing Confirmed by Fredia Sorrow [phone]) on 03/28/2022 8:40:41 AM ? ?Radiology ?DG Chest Port 1 View ? ?Result Date: 03/28/2022 ?CLINICAL DATA:  Shortness of breath, COVID positive. EXAM: PORTABLE CHEST 1 VIEW COMPARISON:  November 27, 2018 FINDINGS: The heart size and mediastinal contours are within normal limits. Both lungs are clear. The visualized skeletal structures are unremarkable. IMPRESSION: No active cardiopulmonary disease. Electronically Signed   By: Abelardo Diesel M.D.   On: 03/28/2022 08:23   ? ?Procedures ?Procedures  ? ? ?Medications Ordered in ED ?Medications  ?benzonatate (TESSALON) capsule 100 mg (100 mg Oral Given 03/28/22 1016)  ? ? ?ED Course/ Medical Decision Making/ A&P ?  ?                         ?Medical Decision Making ?Amount and/or Complexity of Data Reviewed ?Labs: ordered. ?Radiology: ordered. ?ECG/medicine tests: ordered. ? ? ?BP 137/76   Pulse 66   Temp 97.7 ?F (36.5 ?C) (Oral)   Resp 18   SpO2 95%  ? ?8:19 AM ?This is a 66 year old male with multiple comorbidities which includes hypertension, diabetes, cancer, recurrent PEs presenting with cold symptoms since yesterday.  He also reported having a positive home COVID test this morning.  When arrived, nurse noted that patient has an oxygen of 88-92% on room air, patient was placed on supplemental oxygen at 2 L which bring his oxygen up to 98%.  On exam, he is resting comfortably appears to be in no acute discomfort, lungs clear on auscultation.  Work-up initiated. ? ?9:42 AM ?Labs, EKG, and imaging independently viewed interpreted by me.  Patient does have a confirmed positive COVID test here.  Electrolyte panels are reassuring, chest x-ray without any signs of pneumonia or other active cardiopulmonary disease and EKG showing sinus rhythm. ? ?When we have patient ambulate again with monitor, his O2 saturation is 98-100% on room air.  Given no true hypoxia, and patient is within the window of treatment with antiviral, patient discharged home with cough medication as well as Paxlovid.  Also recommend appropriate quarantine time as well as return precaution. ? ?This patient presents to the ED for concern of cough, this involves an extensive number of treatment options, and is a complaint that carries with it a high risk of complications and morbidity.  The differential diagnosis includes covid, pneumonia, reactive airway disease, asthma, copd ? ?Co morbidities that complicate the patient evaluation ?DM ? HTN ?Additional history obtained: ? ?Additional history obtained from wife ?External records from outside source obtained and reviewed including notes from PCP and cardiology ? ?Lab Tests: ? ?I Ordered, and personally interpreted labs.  The  pertinent results include:  as discussed above ? ?Imaging Studies ordered: ? ?I ordered imaging studies including CXR ?I independently visualized and interpreted imaging which showed unremarkable ?I agree with the radiolog

## 2022-03-28 NOTE — ED Triage Notes (Signed)
Patient reports positive home Covid test this morning. Cough and runny nose started yesterday per patient. Denies N/V/D and fever. ?

## 2022-03-28 NOTE — ED Notes (Signed)
Ambulated pt to room with SpO2 monitoring. Pt's SpO2 maintained 98-100% RA. Pt denied any difficulty breathing, exertional shob.  ?

## 2022-03-28 NOTE — ED Notes (Signed)
Pt states understanding of dc instructions, importance of follow up, and prescriptions. Pt denies questions or concerns upon dc. Pt declined wheelchair assistance upon dc. Pt ambulated out of ed w/ steady gait. No belongings left in room upon dc.  

## 2022-03-29 ENCOUNTER — Encounter: Payer: Self-pay | Admitting: Internal Medicine

## 2022-03-29 ENCOUNTER — Telehealth: Payer: Self-pay | Admitting: Internal Medicine

## 2022-03-29 DIAGNOSIS — K645 Perianal venous thrombosis: Secondary | ICD-10-CM | POA: Insufficient documentation

## 2022-03-29 DIAGNOSIS — Z0001 Encounter for general adult medical examination with abnormal findings: Secondary | ICD-10-CM | POA: Insufficient documentation

## 2022-03-29 NOTE — Assessment & Plan Note (Signed)
Lab Results  Component Value Date   CREATININE 1.08 03/28/2022   Stable overall, cont to avoid nephrotoxins 

## 2022-03-29 NOTE — Assessment & Plan Note (Signed)
Age and sex appropriate education and counseling updated with regular exercise and diet ?Referrals for preventative services - none needed ?Immunizations addressed - declines shingrix, covid booster ?Smoking counseling  - none needed ?Evidence for depression or other mood disorder - none significant ?Most recent labs reviewed. ?I have personally reviewed and have noted: ?1) the patient's medical and social history ?2) The patient's current medications and supplements ?3) The patient's height, weight, and BMI have been recorded in the chart ? ?

## 2022-03-29 NOTE — Assessment & Plan Note (Signed)
Lab Results  Component Value Date   VITAMINB12 >1504 (H) 03/26/2022   Stable, cont oral replacement - b12 1000 mcg qd  

## 2022-03-29 NOTE — Assessment & Plan Note (Signed)
Last vitamin D Lab Results  Component Value Date   VD25OH 50.20 03/26/2022   Stable, cont oral replacement  

## 2022-03-29 NOTE — Assessment & Plan Note (Signed)
Lab Results  ?Component Value Date  ? HGBA1C 7.3 (H) 03/26/2022  ? ?Mild uncontrolled, but hesitate to increase OHA with recent wt loss, pt to continue current medical treatment glucotrol, metformin ? ?

## 2022-03-29 NOTE — Telephone Encounter (Signed)
FYI

## 2022-03-29 NOTE — Assessment & Plan Note (Signed)
Likely gastritis it seems with recent wt loss, for carafate qid, cont PPI, refer GI for possible EGD ?

## 2022-03-29 NOTE — Assessment & Plan Note (Signed)
Mild to mod, for topical steroid cream with lidocaine asd,  to f/u any worsening symptoms or concerns ?

## 2022-03-29 NOTE — Telephone Encounter (Signed)
Connected to [facility] 4.23.2023. ? ?Caller states he tested positive for covid this ?morning and he has a scratchy throat and a runny nose with a cough. No fever.  ? ?Advised to go to ED. ?

## 2022-04-01 ENCOUNTER — Ambulatory Visit: Payer: Self-pay | Admitting: Urology

## 2022-04-12 DIAGNOSIS — Z09 Encounter for follow-up examination after completed treatment for conditions other than malignant neoplasm: Secondary | ICD-10-CM | POA: Diagnosis not present

## 2022-04-12 DIAGNOSIS — Z8616 Personal history of COVID-19: Secondary | ICD-10-CM | POA: Diagnosis not present

## 2022-04-14 ENCOUNTER — Encounter: Payer: Self-pay | Admitting: Urology

## 2022-04-14 NOTE — Progress Notes (Signed)
Called patient to make aware of upcoming telephone visit on 04/15/22 @ 830. Patient voiced understanding. Patients identity verified.  ? ?Meaningful use questions complete ? ?Patient reports minor improvements since completing radiation treatment in March.  ? ?Patients AUA/ IPSS score is 10  ?

## 2022-04-15 ENCOUNTER — Ambulatory Visit
Admission: RE | Admit: 2022-04-15 | Discharge: 2022-04-15 | Disposition: A | Discharge status: Home / Self Care | Payer: HMO | Source: Ambulatory Visit | Attending: Urology | Admitting: Urology

## 2022-04-15 ENCOUNTER — Other Ambulatory Visit: Payer: Self-pay | Admitting: Urology

## 2022-04-15 DIAGNOSIS — C61 Malignant neoplasm of prostate: Secondary | ICD-10-CM

## 2022-04-15 NOTE — Progress Notes (Signed)
?Radiation Oncology         (336) [phone] ?________________________________ ? ?Name: [name] MRN: [id number]  ?Date: 04/15/2022  DOB: 1956/01/29 ? ?Post Treatment Note ? ?CC: Biagio Borg, MD  Ardis Hughs, MD ? ?Diagnosis:   66 y.o. gentleman with Stage T1c adenocarcinoma of the prostate with Gleason score of 3+4, and PSA of 6.75. ? ?Interval Since Last Radiation:  5.5 weeks  ? 01/21/22 - 03/02/22:  The prostate was treated to 70 Gy in 28 fractions of 2.5 Gy ? ?Narrative:  I spoke with the patient to conduct his routine scheduled 1 month follow up visit via telephone to spare the patient unnecessary potential exposure in the healthcare setting during the current COVID-19 pandemic.  The patient was notified in advance and gave permission to proceed with this visit format. ? ?He tolerated radiation treatment relatively well with only minor urinary irritation and modest fatigue.                               ? ?On review of systems, the patient states that he is doing well in general.  He tolerated radiation very well with only some mild dysuria which has now resolved.  He feels like he is pretty much back to his baseline at this point and specifically denies gross hematuria, dysuria, straining to void, incomplete bladder emptying or incontinence.  He reports a healthy appetite and is maintaining his weight.  He denies abdominal pain, nausea, vomiting, diarrhea or constipation.  He does still have some decreased stamina but also is recovering from [place].  Overall, he is quite pleased with his progress to date. ? ?ALLERGIES:  is allergic to adhesive [tape] and other. ? ?Meds: ?Current Outpatient Medications  ?Medication Sig Dispense Refill  ? Artificial Tear Ointment (ARTIFICIAL TEARS) ointment Place 1 drop into both eyes every 4 (four) hours as needed (dry eyes).     ? atorvastatin (LIPITOR) 80 MG tablet TAKE 1 TABLET BY MOUTH EVERY DAY 90 tablet 2  ? chlorthalidone (HYGROTON) 25 MG tablet Take 25 mg by  mouth daily.    ? Cholecalciferol (VITAMIN D3) 125 MCG (5000 UT) CAPS Take 5,000 Units by mouth daily.     ? ferrous sulfate 325 (65 FE) MG EC tablet TAKE 1 TABLET BY MOUTH EVERY DAY 90 tablet 1  ? folic acid (FOLVITE) 1 MG tablet TAKE 1 TABLET BY MOUTH EVERY DAY 90 tablet 1  ? glipiZIDE (GLUCOTROL XL) 2.5 MG 24 hr tablet TAKE 1 TABLET BY MOUTH DAILY WITH BREAKFAST. 90 tablet 3  ? Lidocaine-Hydrocort, Perianal, 3-0.5 % CREA Use as directed three times per day as needed 28.3 g 1  ? metFORMIN (GLUCOPHAGE) 500 MG tablet TAKE 4 TABLETS BY MOUTH DAILY WITH BREAKFAST. 360 tablet 3  ? Multiple Vitamin (MULTIVITAMIN WITH MINERALS) TABS tablet Take 1 tablet by mouth daily at 12 noon.    ? nitroGLYCERIN (NITROSTAT) 0.4 MG SL tablet Place 1 tablet (0.4 mg total) under the tongue every 5 (five) minutes as needed for chest pain. 90 tablet 3  ? sucralfate (CARAFATE) 1 g tablet Take 1 tablet (1 g total) by mouth 4 (four) times daily -  with meals and at bedtime. 120 tablet 0  ? vitamin B-12 1000 MCG tablet Take 1 tablet (1,000 mcg total) by mouth daily. 30 tablet 1  ? benzonatate (TESSALON) 100 MG capsule Take 1 capsule (100 mg total) by mouth every 8 (  eight) hours. (Patient not taking: Reported on 04/14/2022) 21 capsule 0  ? diazepam (VALIUM) 5 MG tablet One tab by mouth, 2 hours before procedure. (Patient not taking: Reported on 04/14/2022) 2 tablet 0  ? escitalopram (LEXAPRO) 10 MG tablet TAKE 1 TABLET BY MOUTH EVERY DAY (Patient not taking: Reported on 04/14/2022) 90 tablet 3  ? gabapentin (NEURONTIN) 100 MG capsule TAKE 2 CAPSULES BY MOUTH AT BEDTIME (Patient not taking: Reported on 04/14/2022) 60 capsule 0  ? pantoprazole (PROTONIX) 40 MG tablet TAKE 1 TABLET BY MOUTH EVERY DAY (Patient not taking: Reported on 04/14/2022) 90 tablet 3  ? ?No current facility-administered medications for this encounter.  ? ? ?Physical Findings: ? vitals were not taken for this visit.  ?Pain Assessment ?Pain Score: 0-No pain/10 ?Unable to assess due  to telephone follow-up visit format. ? ?Lab Findings: ?Lab Results  ?Component Value Date  ? WBC 5.5 03/28/2022  ? HGB 11.9 (L) 03/28/2022  ? HCT 37.2 (L) 03/28/2022  ? MCV 71.7 (L) 03/28/2022  ? PLT 189 03/28/2022  ? ? ? ?Radiographic Findings: ?DG Chest Port 1 View ? ?Result Date: 03/28/2022 ?CLINICAL DATA:  Shortness of breath, COVID positive. EXAM: PORTABLE CHEST 1 VIEW COMPARISON:  November 27, 2018 FINDINGS: The heart size and mediastinal contours are within normal limits. Both lungs are clear. The visualized skeletal structures are unremarkable. IMPRESSION: No active cardiopulmonary disease. Electronically Signed   By: Abelardo Diesel M.D.   On: 03/28/2022 08:23  ? ?CT ANGIO CHEST AORTA W/CM & OR WO/CM ? ?Result Date: 03/24/2022 ?CLINICAL DATA:  Follow-up of aneurysm of thoracic aorta. EXAM: CT ANGIOGRAPHY CHEST WITH CONTRAST TECHNIQUE: Multidetector CT imaging of the chest was performed using the standard protocol during bolus administration of intravenous contrast. Multiplanar CT image reconstructions and MIPs were obtained to evaluate the vascular anatomy. RADIATION DOSE REDUCTION: This exam was performed according to the departmental dose-optimization program which includes automated exposure control, adjustment of the mA and/or kV according to patient size and/or use of iterative reconstruction technique. CONTRAST:  151m OMNIPAQUE IOHEXOL 350 MG/ML SOLN COMPARISON:  03/23/2021 FINDINGS: Cardiovascular: Coronary artery calcifications are seen. There is homogeneous enhancement in the thoracic aorta. Ascending thoracic aorta measures 4.1 cm which has not changed significantly. There are scattered atherosclerotic plaques and calcifications in the thoracic aorta. Minimal pericardial effusion is present. There are no intraluminal filling defects in the pulmonary artery branches. Mediastinum/Nodes: No significant lymphadenopathy seen. Lungs/Pleura: No focal pulmonary infiltrates are seen. There are no discrete  lung nodules. Minimal scarring is seen in the medial right lower lung fields. Small pleural plaques are seen in the lower lung fields. There is no pleural effusion or pneumothorax. Left hemidiaphragm is elevated. Upper Abdomen: There is evidence of previous bariatric surgery in the stomach. There are tiny calcific densities in the dependent portion of gallbladder. There is fatty infiltration in the liver. Musculoskeletal: Unremarkable. Review of the MIP images confirms the above findings. IMPRESSION: There is no evidence of pulmonary artery embolism. There is ectasia of ascending thoracic aorta measuring 4.1 cm with no significant interval change. There is no evidence of thoracic aortic dissection. Coronary artery calcifications are seen. Minimal pericardial effusion. Fatty liver. Gallbladder stones. Other findings as described in the body of the report. Electronically Signed   By: PElmer PickerM.D.   On: 03/24/2022 12:19   ? ?Impression/Plan: ?1. 66y.o. gentleman with Stage T1c adenocarcinoma of the prostate with Gleason score of 3+4, and PSA of 6.75. ?He will continue to  follow up with urology for ongoing PSA determinations and has an appointment scheduled with Dr. Louis Meckel in June 2023. He understands what to expect with regards to PSA monitoring going forward. I will look forward to following his response to treatment via correspondence with urology, and would be happy to continue to participate in his care if clinically indicated. I talked to the patient about what to expect in the future, including his risk for erectile dysfunction and rectal bleeding. I encouraged him to call or return to the office if he has any questions regarding his previous radiation or possible radiation side effects. He was comfortable with this plan and will follow up as needed.  ? ? ?Nicholos Johns, PA-C  ?

## 2022-04-15 NOTE — Progress Notes (Signed)
?  Radiation Oncology         (336) [phone] ?________________________________ ? ?Name: [name] MRN: [id number]  ?Date: 03/02/2022  DOB: 02-20-1956 ? ?End of Treatment Note ? ?Diagnosis:   66 y.o. gentleman with Stage T1c adenocarcinoma of the prostate with Gleason score of 3+4, and PSA of 6.75.    ? ?Indication for treatment:  Curative, Definitive Radiotherapy      ? ?Radiation treatment dates:   01/21/22 - 03/02/22 ? ?Site/dose:   The prostate was treated to 70 Gy in 28 fractions of 2.5 Gy ? ?Beams/energy:   The patient was treated with IMRT using volumetric arc therapy delivering 6 MV X-rays to clockwise and counterclockwise circumferential arcs with a 90 degree collimator offset to avoid dose scalloping.  Image guidance was performed with daily cone beam CT prior to each fraction to align to gold markers in the prostate and assure proper bladder and rectal fill volumes.  Immobilization was achieved with [employer] custom mold. ? ?Narrative: The patient tolerated radiation treatment relatively well with only minor urinary irritation and modest fatigue.   ? ?Plan: The patient has completed radiation treatment. He will return to radiation oncology clinic for routine followup in one month. I advised him to call or return sooner if he has any questions or concerns related to his recovery or treatment. ?________________________________ ? ?Sheral Apley Tammi Klippel, M.D.  ?

## 2022-04-17 NOTE — Progress Notes (Signed)
Cardiology Office Note:    Date:  04/26/2022   ID:  [name], [name] 1956-08-31, MRN [id number]  PCP:  [name] Borg, MD   [place]  Cardiologist:  [name] Dawley, MD  Advanced Practice Provider:  No care team member to display Electrophysiologist:  None    Referring MD: [name] Borg, MD    History of Present Illness:    [name] is a 66 y.o. male with a hx of HTN, DM, CAD with Coronary CTA 07/2015 demonstrating a calcium score 4, diffuse moderate CAD with a long segment of calcified plaque in the proximal to mid LAD associated with 45 to 69% stenosis, obesity, HLD, history of PE/DVT previously on warfarin, prostate cancer s/p XRT who was previously followed by Dr. Meda [name] who now presents to clinic for follow-up.  Seen in clinic on 02/10/21 as an urgent visit for chest pain,nausea and diaphoresis. We recommended coronary angiography which he underwent on 02/16/21. Cath showed moderate, non-obstructive CAD.  Had episode of GIB in 10/2021 following prostate biopsy where he was noted to have rectal bleeding. Hemoglobin dropped to 4.9. IR performed rectal artery embolization. He is a [name] and therefore he was treated with vitamin K, IV iron, aransep, and oral G29 and folic acid. Hemoglobin improved back to 11 most recently. Warfarin has been stopped.   Last seen in clinic on 12/2021 by Coletta Memos, NP where he was doing well from a CV standpoint.   Today, the patient overall feels well. He denies any chest pain or SOB. Has been having some GI upset and feels unwell with eating.Occurs with all types of food. No exertional symptoms. Just completed his radiation treatments for prostate cancer and is getting back to exercising. No LE edema, orthopnea, syncope. Blood pressure at goal. No recurrence of GI bleeding.  Past Medical History:  Diagnosis Date   Arthritis    BACK PAIN    CAD (coronary artery disease) 06/28/2017   CAD in native  artery    a. moderate by cardiac CT 2016.   Chronic bilateral low back pain with bilateral sciatica 01/12/2019   Degenerative arthritis of right knee 07/15/2016   Injected in 07/15/2016 Orthovisc injection started 10/07/2016 DepoMedrol 40 inj on 04/11/17, 25 cc aspirated Orthovisc injection in 05/30/2017     DEGENERATIVE JOINT DISEASE, KNEES, BILATERAL    Depression    Diabetes ([place]) 10/01/2014   Diabetes mellitus    type II   DIVERTICULOSIS, COLON    DVT, lower extremity ([place])    right   History of kidney stones    Hyperlipidemia    Hypertension    HYPOGONADISM, MALE    HYPOTENSION, ORTHOSTATIC    Kidney stones    Long term (current) use of anticoagulants 08/17/2017   Long term current use of anticoagulant    Lumbosacral radiculopathy at S1 06/14/2012   Maxillary sinus cyst 12/11/2018   Obesity    OBESITY, MORBID 03/04/2008   Qualifier: Diagnosis of  By: Jenny Reichmann MD, [place]    OSA (obstructive sleep apnea) 09/11/2013   Pulmonary emboli (HCC)    Pulmonary embolism ([place]) 02/05/2011   RBBB 07/28/2017   Renal insufficiency    a. prior h/o, does not appear chronic.   Superficial phlebitis of arm 03/18/2016   Vitamin D deficiency 07/09/2020    Past Surgical History:  Procedure Laterality Date   ankle surgury  2001 bilateral   with bone spurs and torn tendon   COLONOSCOPY  03/18/2010   EDG  2008   chronic Duodenitis   FLEXIBLE SIGMOIDOSCOPY N/A 11/03/2021   Procedure: FLEXIBLE SIGMOIDOSCOPY;  Surgeon: Doran Stabler, MD;  Location: [facility];  Service: Gastroenterology;  Laterality: N/A;   GASTRIC BYPASS     HEEL SPUR SURGERY Bilateral 2005   IR ANGIOGRAM PELVIS SELECTIVE OR SUPRASELECTIVE  11/02/2021   IR ANGIOGRAM PELVIS SELECTIVE OR SUPRASELECTIVE  11/04/2021   IR ANGIOGRAM SELECTIVE EACH ADDITIONAL VESSEL  11/02/2021   IR ANGIOGRAM SELECTIVE EACH ADDITIONAL VESSEL  11/02/2021   IR ANGIOGRAM SELECTIVE EACH ADDITIONAL VESSEL  11/02/2021   IR ANGIOGRAM SELECTIVE EACH ADDITIONAL  VESSEL  11/04/2021   IR ANGIOGRAM SELECTIVE EACH ADDITIONAL VESSEL  11/04/2021   IR ANGIOGRAM SELECTIVE EACH ADDITIONAL VESSEL  11/04/2021   IR ANGIOGRAM SELECTIVE EACH ADDITIONAL VESSEL  11/04/2021   IR ANGIOGRAM VISCERAL SELECTIVE  11/02/2021   IR ANGIOGRAM VISCERAL SELECTIVE  11/04/2021   IR EMBO ART  VEN HEMORR LYMPH EXTRAV  INC GUIDE ROADMAPPING  11/02/2021   IR EMBO ART  VEN HEMORR LYMPH EXTRAV  INC GUIDE ROADMAPPING  11/04/2021   IR EMBO ART  VEN HEMORR LYMPH EXTRAV  INC GUIDE ROADMAPPING  11/04/2021   IR US GUIDE VASC ACCESS RIGHT  11/02/2021   IR US GUIDE [place] RIGHT  11/04/2021   knee surgury  2000 & 2003    cartilage damage   LEFT HEART CATH AND CORONARY ANGIOGRAPHY N/A 02/16/2021   Procedure: LEFT HEART CATH AND CORONARY ANGIOGRAPHY;  Surgeon: Sherren Mocha, MD;  Location: [facility];  Service: Cardiovascular;  Laterality: N/A;   LUMBAR LAMINECTOMY/DECOMPRESSION MICRODISCECTOMY  09/28/2012   Procedure: LUMBAR LAMINECTOMY/DECOMPRESSION MICRODISCECTOMY 2 LEVELS;  Surgeon: [name], MD;  Location: [facility];  Service: Orthopedics;  Laterality: Left;  Decompressive laminectomy L4-L5. Microdiscectomy L5-S1   Skin removal surgery     extra abdominal skin removed from his weight loss   TONSILLECTOMY AND ADENOIDECTOMY  age 70 or 6   TOTAL KNEE ARTHROPLASTY  01/10/2012   Procedure: TOTAL KNEE ARTHROPLASTY;  Surgeon: [name], MD;  Location: [facility];  Service: Orthopedics;  Laterality: Left;   TOTAL KNEE ARTHROPLASTY Right 11/18/2020   Procedure: TOTAL KNEE ARTHROPLASTY;  Surgeon: Paralee Cancel, MD;  Location: [facility];  Service: Orthopedics;  Laterality: Right;  70 mins   UPPER GASTROINTESTINAL ENDOSCOPY  03/18/2010    Current Medications: Current Meds  Medication Sig   Artificial Tear Ointment (ARTIFICIAL TEARS) ointment Place 1 drop into both eyes every 4 (four) hours as needed (dry eyes).    atorvastatin (LIPITOR) 80 MG tablet TAKE 1 TABLET BY MOUTH EVERY DAY    benzonatate (TESSALON) 200 MG capsule Take 1 capsule (200 mg total) by mouth 3 (three) times daily as needed.   chlorthalidone (HYGROTON) 25 MG tablet Take 25 mg by mouth daily.   Cholecalciferol (VITAMIN D3) 125 MCG (5000 UT) CAPS Take 5,000 Units by mouth daily.    diazepam (VALIUM) 5 MG tablet One tab by mouth, 2 hours before procedure.   escitalopram (LEXAPRO) 10 MG tablet TAKE 1 TABLET BY MOUTH EVERY DAY   ferrous sulfate 325 (65 FE) MG EC tablet TAKE 1 TABLET BY MOUTH EVERY DAY   folic acid (FOLVITE) 1 MG tablet TAKE 1 TABLET BY MOUTH EVERY DAY   gabapentin (NEURONTIN) 100 MG capsule TAKE 2 CAPSULES BY MOUTH AT BEDTIME   glipiZIDE (GLUCOTROL XL) 2.5 MG 24 hr tablet TAKE 1 TABLET BY MOUTH DAILY WITH BREAKFAST.  Lidocaine-Hydrocort, Perianal, 3-0.5 % CREA Use as directed three times per day as needed   metFORMIN (GLUCOPHAGE) 500 MG tablet TAKE 4 TABLETS BY MOUTH DAILY WITH BREAKFAST.   Multiple Vitamin (MULTIVITAMIN WITH MINERALS) TABS tablet Take 1 tablet by mouth daily at 12 noon.   nitroGLYCERIN (NITROSTAT) 0.4 MG SL tablet Place 1 tablet (0.4 mg total) under the tongue every 5 (five) minutes as needed for chest pain.   pantoprazole (PROTONIX) 40 MG tablet TAKE 1 TABLET BY MOUTH EVERY DAY   promethazine-dextromethorphan (PROMETHAZINE-DM) 6.25-15 MG/5ML syrup Take 5 mLs by mouth 4 (four) times daily as needed.   sucralfate (CARAFATE) 1 g tablet Take 1 tablet (1 g total) by mouth 4 (four) times daily -  with meals and at bedtime.   vitamin B-12 1000 MCG tablet Take 1 tablet (1,000 mcg total) by mouth daily.     Allergies:   Adhesive [tape] and Other   Social History   Socioeconomic History   Marital status: Married    Spouse name: Not on file   Number of children: 2   Years of education: Not on file   Highest education level: High school graduate  Occupational History   Occupation: Retired    Fish farm manager: [employer]  Tobacco Use   Smoking status: Former    Packs/day: 0.25     Years: 5.00    Pack years: 1.25    Types: Cigarettes    Quit date: 12/06/1978    Years since quitting: 43.4   Smokeless tobacco: Never  Vaping Use   Vaping Use: Never used  Substance and Sexual Activity   Alcohol use: Yes    Alcohol/week: 2.0 standard drinks    Types: 2 Standard drinks or equivalent per week   Drug use: No   Sexual activity: Not Currently  Other Topics Concern   Not on file  Social History Narrative   Daily caffeine use one per day   Protein drinks    Children; a friend and sister that helps   Brother that is around [place])   Son and dtr in [place]   Lives w/ wife in [place]   Left handed     Retired from [employer]   Former smoker 2 alcoholic beverages daily no caffeine no drug use no other tobacco at this time   Social Determinants of Radio broadcast assistant Strain: Not on file  Food Insecurity: Not on file  Transportation Needs: Not on file  Physical Activity: Not on file  Stress: Not on file  Social Connections: Not on file     Family History: The patient's family history includes Diabetes in his brother, sister, sister, and sister; Heart failure in his sister; Hypertension in his mother; Prostate cancer in his father. There is no history of Colon cancer, Colon polyps, Esophageal cancer, Rectal cancer, or Stomach cancer.  ROS:   Please see the history of present illness.    Review of Systems  Constitutional:  Negative for chills and fever.  HENT:  Negative for hearing loss.   Respiratory:  Negative for shortness of breath.   Cardiovascular:  Negative for chest pain, palpitations, orthopnea, claudication, leg swelling and PND.  Gastrointestinal:  Positive for nausea. Negative for blood in stool, melena and vomiting.  Musculoskeletal:  Negative for falls and myalgias.  Neurological:  Negative for dizziness and loss of consciousness.  Psychiatric/Behavioral:  Negative for substance abuse.    EKGs/Labs/Other Studies Reviewed:    The  following studies were reviewed today: Cath 02/16/21:  1. Moderate calcific, nonobstructive proximal/mid LAD stenosis estimated at 50% 2. Mild nonobstructive left main, RCA, ramus intermedius, and LCx stenosis with no significant lesions in any of those vessels 3. Normal LVEDP   Recommend: Ongoing medical therapy - LAD stenosis appears 50% non-obstructive, similar to report of LAD on 2016 coronary CTA Resume warfarin today as instructed by anticoag clinic Resume lovenox tomorrow morning if bridging   Coronary CTA 07/30/2015 IMPRESSION: 1. Coronary calcium score of 1186. This was 74 percentile for age and sex matched control.   2. Normal coronary origin.   3. There is diffuse moderate CAD, with a long segment of calcified plaque in the proximal to mid LAD associated with 50-69% stenosis. No obstructive plaque was seen.   An aggressive medical therapy is recommended.   TTE 11/28/2018: Study Conclusions  - Left ventricle: The cavity size was normal. There was mild    concentric hypertrophy. Systolic function was normal. The    estimated ejection fraction was in the range of 60% to 65%. Wall    motion was normal; there were no regional wall motion    abnormalities. Doppler parameters are consistent with abnormal    left ventricular relaxation (grade 1 diastolic dysfunction).    Doppler parameters are consistent with indeterminate ventricular    filling pressure.  - Aortic valve: Transvalvular velocity was within the normal range.    There was no stenosis. There was no regurgitation. Valve area    (VTI): 2.67 cm^2. Valve area (Vmean): 3.07 cm^2.  - Aorta: Ascending aortic diameter: 41 mm (S).  - Ascending aorta: The ascending aorta was mildly dilated.  - Mitral valve: Transvalvular velocity was within the normal range.    There was no evidence for stenosis. There was no regurgitation.  - Right ventricle: The cavity size was normal. Wall thickness was    normal. Systolic function was  normal.  - Tricuspid valve: There was no regurgitation.     Recent Labs: 03/26/2022: ALT 28; TSH 1.80 03/28/2022: BUN 28; Creatinine, Ser 1.08; Potassium 4.4; Sodium 137 04/23/2022: Hemoglobin 11.5; Platelet Count 203  Recent Lipid Panel    Component Value Date/Time   CHOL 91 03/26/2022 1439   TRIG 55.0 03/26/2022 1439   HDL 41.00 03/26/2022 1439   CHOLHDL 2 03/26/2022 1439   VLDL 11.0 03/26/2022 1439   LDLCALC 39 03/26/2022 1439   LDLCALC 74 07/09/2020 1033   LDLDIRECT 116.0 01/15/2020 1624     Physical Exam:    VS:  BP 122/74   Pulse 82   Ht '6\' 2"'$  (1.88 m)   Wt 241 lb 3.2 oz (109.4 kg)   SpO2 95%   BMI 30.97 kg/m     Wt Readings from Last 3 Encounters:  04/26/22 241 lb 3.2 oz (109.4 kg)  04/23/22 242 lb 8 oz (110 kg)  04/20/22 237 lb 12.8 oz (107.9 kg)     GEN:  Well nourished, well developed in no acute distress HEENT: Normal NECK: No JVD; No carotid bruits CARDIAC: RRR, soft 1/6 systolic murmur RUSB RESPIRATORY:  Clear to auscultation without rales, wheezing or rhonchi  ABDOMEN: Soft, non-tender, non-distended MUSCULOSKELETAL:  No edema; No deformity  SKIN: Warm and dry NEUROLOGIC:  Alert and oriented x 3 PSYCHIATRIC:  Normal affect   ASSESSMENT:    1. Coronary artery disease involving native coronary artery of native heart without angina pectoris   2. Hyperlipidemia, unspecified hyperlipidemia type   3. Essential hypertension   4. Type 2 diabetes mellitus without complication, without  long-term current use of insulin ([place])   5. History of pulmonary embolism   6. History of GI bleed   7. Prostate cancer [facility])    PLAN:    In order of problems listed above:  #Moderate CAD: Coronary CTA in 2016 with calcium score 1186 which is 67 percentile for age and sex match control with 17 to 69% proximal to mid LAD. With recent episode of chest pain prompting coronary angiography which showed moderate disease. Plan for continued medical management. -Cath 02/2021  with moderate, nonobstructive disease -TTE 10/2021 with LVEF 65-70%, G1DD, normal RV, no significant valve disease -Continue lipitor '80mg'$  daily -Not on ASA due to recent GIB; may consider adding in future   #History of PE/DVT: Occurred after surgery. Now off warfarin due to recent rectal bleeding.  -Off warfarin  #History of GIB: Patient with rectal bleeding after prostate biopsy requiring admission in 10/2021. S/p IR embolization of recatl artery. Warfarin has since been stopped. Hemoglobin improved to 11. Did not receive blood products as patient is Jehova's [name]. -Management per heme -Off AC   #DMII: Managed by PCP. -Continue metformin and glipizide   #HTN: Controlled.  -Continue chlorthalidone '25mg'$  daily   #HLD: -Continue lipitor '40mg'$  daily -LDL controlled at 39 03/2022  #Prostate Cancer: -S/p XRT -Follows with Dr. Burr Medico   Medication Adjustments/Labs and Tests Ordered: Current medicines are reviewed at length with the patient today.  Concerns regarding medicines are outlined above.  No orders of the defined types were placed in this encounter.  No orders of the defined types were placed in this encounter.   Patient Instructions  Medication Instructions:   Your physician recommends that you continue on your current medications as directed. Please refer to the Current Medication list given to you today.  *If you need a refill on your cardiac medications before your next appointment, please call your pharmacy*   Follow-Up: At [facility], you and your health needs are our priority.  As part of our continuing mission to provide you with exceptional heart care, we have created designated Provider Care Teams.  These Care Teams include your primary Cardiologist (physician) and Advanced Practice Providers (APPs -  Physician Assistants and Nurse Practitioners) who all work together to provide you with the care you need, when you need it.  We recommend signing up for  the patient portal called "MyChart".  Sign up information is provided on this After Visit Summary.  MyChart is used to connect with patients for Virtual Visits (Telemedicine).  Patients are able to view lab/test results, encounter notes, upcoming appointments, etc.  Non-urgent messages can be sent to your provider as well.   To learn more about what you can do with MyChart, go to [email or web address].    Your next appointment:   6 month(s)  The format for your next appointment:   In Person  Provider:   Dr. Johney Frame   Important Information About Sugar          Signed, Freada Bergeron, MD  04/26/2022 10:26 AM    [place]

## 2022-04-19 ENCOUNTER — Other Ambulatory Visit: Payer: Self-pay | Admitting: Internal Medicine

## 2022-04-19 ENCOUNTER — Telehealth: Payer: Self-pay | Admitting: Internal Medicine

## 2022-04-19 NOTE — Telephone Encounter (Signed)
Patient states he can not rest due to coughing that is burning his throat. He is not running a fever and tested negative for COVID.  ? ?Schedule patient for an apt for tomorrow. ?

## 2022-04-19 NOTE — Telephone Encounter (Signed)
Pt has developed a nagging cough in throat. Throat sore, continuous coughing x2 days.  ? ?Negative COVID test. Requesting call back from assistant to best advise.  ?

## 2022-04-20 ENCOUNTER — Ambulatory Visit (INDEPENDENT_AMBULATORY_CARE_PROVIDER_SITE_OTHER): Payer: HMO | Admitting: Internal Medicine

## 2022-04-20 ENCOUNTER — Encounter: Payer: Self-pay | Admitting: Internal Medicine

## 2022-04-20 ENCOUNTER — Other Ambulatory Visit: Payer: Self-pay | Admitting: Internal Medicine

## 2022-04-20 ENCOUNTER — Telehealth: Payer: Self-pay | Admitting: Urology

## 2022-04-20 DIAGNOSIS — R051 Acute cough: Secondary | ICD-10-CM | POA: Diagnosis not present

## 2022-04-20 DIAGNOSIS — Z7901 Long term (current) use of anticoagulants: Secondary | ICD-10-CM

## 2022-04-20 DIAGNOSIS — R059 Cough, unspecified: Secondary | ICD-10-CM | POA: Insufficient documentation

## 2022-04-20 MED ORDER — BENZONATATE 200 MG PO CAPS: 200.0000 mg | ORAL_CAPSULE | Freq: Three times a day (TID) | ORAL | 0 refills | Status: DC | PRN

## 2022-04-20 MED ORDER — PROMETHAZINE-DM 6.25-15 MG/5ML PO SYRP: 5.0000 mL | ORAL_SOLUTION | Freq: Four times a day (QID) | ORAL | 0 refills | Status: DC | PRN

## 2022-04-20 NOTE — Patient Instructions (Signed)
We have sent in the cough medicine to use during the daytime tessalon perles and the cough liquid to take at night time if needed promethazine/dm. ?

## 2022-04-20 NOTE — Assessment & Plan Note (Signed)
Likely due to viral URI. Rx tessalon perles and promethazine/dm cough syrup. Lungs clear on exam and symptoms should resolve within 1-2 weeks (day 3 of symptoms today). ?

## 2022-04-20 NOTE — Telephone Encounter (Signed)
I called and spoke with [name] wife, [name], regarding some additional questions that she had. She reports that her husband has continued with abdominal discomfort following meals and is unable to eat full meals due to the discomfort and associated nausea so she is getting concerned about his nutrition and what the underlying issue might be.  She reports that he is not having hematochezia, hematuria, diarrhea, constipation or vomiting. He has not run fevers but did have some sore throat and mild, dry cough over the weekend, similar to symptoms he had with recent COVID infection. He took a home COVID test which was negative. He is not having other LUTS to suggest UTI and is voiding without difficulty. We discussed that there may be residual radiation induced colitis, especially in light of the COVID infection that he had shortly after completing radiation. I advised that they try a very bland diet, like a BRAT diet to see if this helps. They can gradually advance the diet as tolerated and we discussed this in detail. She was grateful for the return call and information. All questions were answered to her stated satisfaction. He has a follow up visit with his PCP, Dr. Jenny Reichmann soon and will get his opinion on any other recommendations or need for further evaluation. They know that they are welcome to call at any time with further questions or concerns. ? ?Nicholos Johns, MMS, PA-C ?[facility] ?Radiation Oncology Physician Assistant ?Direct Dial: [phone]  Fax: [phone] ? ?

## 2022-04-20 NOTE — Progress Notes (Signed)
? ?  Subjective:  ? ?Patient ID: [name], male    DOB: 05-Jul-1956, 66 y.o.   MRN: [id number] ? ?Cough ?Associated symptoms include rhinorrhea. Pertinent negatives include no chills, ear pain, fever, myalgias, postnasal drip, sore throat, shortness of breath or wheezing.  ?The patient is a 65 YO man coming in for sick visit.  ? ?Review of Systems  ?Constitutional:  Negative for activity change, appetite change, chills, fatigue, fever and unexpected weight change.  ?HENT:  Positive for congestion and rhinorrhea. Negative for ear discharge, ear pain, postnasal drip, sinus pressure, sinus pain, sneezing, sore throat, tinnitus, trouble swallowing and voice change.   ?Eyes: Negative.   ?Respiratory:  Positive for cough. Negative for chest tightness, shortness of breath and wheezing.   ?Cardiovascular: Negative.   ?Gastrointestinal: Negative.   ?Musculoskeletal:  Negative for myalgias.  ?Neurological: Negative.   ? ?Objective:  ?Physical Exam ?Constitutional:   ?   Appearance: He is well-developed.  ?HENT:  ?   Head: Normocephalic and atraumatic.  ?   Comments: Oropharynx with redness and clear drainage, nose with swollen turbinates, TMs normal bilaterally.  ?Neck:  ?   Thyroid: No thyromegaly.  ?Cardiovascular:  ?   Rate and Rhythm: Normal rate and regular rhythm.  ?Pulmonary:  ?   Effort: Pulmonary effort is normal. No respiratory distress.  ?   Breath sounds: Normal breath sounds. No wheezing or rales.  ?Abdominal:  ?   Palpations: Abdomen is soft.  ?Musculoskeletal:     ?   General: No tenderness.  ?   Cervical back: Normal range of motion.  ?Lymphadenopathy:  ?   Cervical: No cervical adenopathy.  ?Skin: ?   General: Skin is warm and dry.  ?Neurological:  ?   Mental Status: He is alert and oriented to person, place, and time.  ? ? ?Vitals:  ? 04/20/22 1458  ?BP: 124/70  ?Pulse: 70  ?Resp: 18  ?Temp: 98.1 ?F (36.7 ?C)  ?TempSrc: Oral  ?SpO2: 98%  ?Weight: 237 lb 12.8 oz (107.9 kg)  ?Height: '6\' 2"'$  (1.88 m)   ? ? ?Assessment & Plan:  ? ?

## 2022-04-23 ENCOUNTER — Inpatient Hospital Stay: Payer: HMO | Attending: Radiation Oncology | Admitting: Hematology

## 2022-04-23 ENCOUNTER — Other Ambulatory Visit: Payer: Self-pay

## 2022-04-23 ENCOUNTER — Inpatient Hospital Stay: Payer: HMO

## 2022-04-23 VITALS — BP 136/80 | HR 61 | Temp 98.0°F | Wt 242.5 lb

## 2022-04-23 DIAGNOSIS — Z86711 Personal history of pulmonary embolism: Secondary | ICD-10-CM | POA: Insufficient documentation

## 2022-04-23 DIAGNOSIS — Z79899 Other long term (current) drug therapy: Secondary | ICD-10-CM | POA: Insufficient documentation

## 2022-04-23 DIAGNOSIS — Z86718 Personal history of other venous thrombosis and embolism: Secondary | ICD-10-CM | POA: Diagnosis not present

## 2022-04-23 DIAGNOSIS — D500 Iron deficiency anemia secondary to blood loss (chronic): Secondary | ICD-10-CM

## 2022-04-23 DIAGNOSIS — E785 Hyperlipidemia, unspecified: Secondary | ICD-10-CM | POA: Diagnosis not present

## 2022-04-23 DIAGNOSIS — Z888 Allergy status to other drugs, medicaments and biological substances status: Secondary | ICD-10-CM | POA: Diagnosis not present

## 2022-04-23 DIAGNOSIS — E119 Type 2 diabetes mellitus without complications: Secondary | ICD-10-CM | POA: Insufficient documentation

## 2022-04-23 DIAGNOSIS — K625 Hemorrhage of anus and rectum: Secondary | ICD-10-CM | POA: Diagnosis not present

## 2022-04-23 DIAGNOSIS — I10 Essential (primary) hypertension: Secondary | ICD-10-CM | POA: Insufficient documentation

## 2022-04-23 DIAGNOSIS — D508 Other iron deficiency anemias: Secondary | ICD-10-CM | POA: Insufficient documentation

## 2022-04-23 DIAGNOSIS — C61 Malignant neoplasm of prostate: Secondary | ICD-10-CM | POA: Diagnosis not present

## 2022-04-23 DIAGNOSIS — Z8719 Personal history of other diseases of the digestive system: Secondary | ICD-10-CM | POA: Diagnosis not present

## 2022-04-23 DIAGNOSIS — Z510 Encounter for antineoplastic radiation therapy: Secondary | ICD-10-CM | POA: Diagnosis present

## 2022-04-23 DIAGNOSIS — D62 Acute posthemorrhagic anemia: Secondary | ICD-10-CM

## 2022-04-23 DIAGNOSIS — Z87442 Personal history of urinary calculi: Secondary | ICD-10-CM | POA: Diagnosis not present

## 2022-04-23 DIAGNOSIS — Z7901 Long term (current) use of anticoagulants: Secondary | ICD-10-CM | POA: Diagnosis not present

## 2022-04-23 DIAGNOSIS — Z9884 Bariatric surgery status: Secondary | ICD-10-CM | POA: Diagnosis not present

## 2022-04-23 DIAGNOSIS — I2510 Atherosclerotic heart disease of native coronary artery without angina pectoris: Secondary | ICD-10-CM | POA: Insufficient documentation

## 2022-04-23 LAB — CBC WITH DIFFERENTIAL (CANCER CENTER ONLY)
Abs Immature Granulocytes: 0.01 10*3/uL (ref 0.00–0.07)
Basophils Absolute: 0 10*3/uL (ref 0.0–0.1)
Basophils Relative: 0 %
Eosinophils Absolute: 0.2 10*3/uL (ref 0.0–0.5)
Eosinophils Relative: 4 %
HCT: 36 % — ABNORMAL LOW (ref 39.0–52.0)
Hemoglobin: 11.5 g/dL — ABNORMAL LOW (ref 13.0–17.0)
Immature Granulocytes: 0 %
Lymphocytes Relative: 26 %
Lymphs Abs: 1.4 10*3/uL (ref 0.7–4.0)
MCH: 22.5 pg — ABNORMAL LOW (ref 26.0–34.0)
MCHC: 31.9 g/dL (ref 30.0–36.0)
MCV: 70.3 fL — ABNORMAL LOW (ref 80.0–100.0)
Monocytes Absolute: 0.5 10*3/uL (ref 0.1–1.0)
Monocytes Relative: 10 %
Neutro Abs: 3.1 10*3/uL (ref 1.7–7.7)
Neutrophils Relative %: 60 %
Platelet Count: 203 10*3/uL (ref 150–400)
RBC: 5.12 MIL/uL (ref 4.22–5.81)
RDW: 16.7 % — ABNORMAL HIGH (ref 11.5–15.5)
WBC Count: 5.2 10*3/uL (ref 4.0–10.5)
nRBC: 0 % (ref 0.0–0.2)

## 2022-04-23 LAB — FERRITIN: Ferritin: 80 ng/mL (ref 24–336)

## 2022-04-23 NOTE — Progress Notes (Signed)
[place]   [phone] [phone]   Clinic Follow up Note   Patient Care Team: Biagio Borg, MD as PCP - General Meda Coffee Jamse Belfast, MD as PCP - Cardiology (Cardiology) Suella Broad, MD as Consulting Physician (Physical Medicine and Rehabilitation) Eldridge Abrahams, MD as Referring Physician (Surgical Oncology) Renato Shin, MD as Consulting Physician (Endocrinology) Calvert Cantor, MD as Consulting Physician (Ophthalmology)  Date of Service:  04/23/2022  CHIEF COMPLAINT: f/u of acute anemia  CURRENT THERAPY:  IV Feraheme as needed (if ferritin<30 or 50 with anemia)  ASSESSMENT & PLAN:  [name] is a 66 y.o. male with   1.  Iron deficient anemia secondary to blood loss, and gastric bypass surgery -he developed rectal bleeding following prostate biopsy secondary to anticoagulation and was admitted for severe anemia 11/02/21 - 11/08/21 -Hemoglobin dropped to 4.9, he does not accept blood products as a [employer].  He underwent IR rectal artery embolization, received vitamin K, Aranesp, IV iron, and was started on oral M54 and folic acid. -he received IV Feraheme 12/7 and 11/19/21 and responded well. -labs reviewed, hgb stable at 11.5 today. Last ferritin on 02/22/22 was on the low side of normal at 39; level today is pending. If his ferritin today is <50, I will recommend additional IV iron. -given improvement, we will repeat lab in 3 and 6 months.   2. H/o provoked bilateral PE after orthopedic surgery 2007 -He was on Coumadin for 15 years after PE, held during hospitalization for acute blood loss anemia  -Currently no signs of recurrent thrombosis or PE   3. Prostate cancer cT1cN0, Gleason 7 -diagnosed 10/2021 -s/p 5.5 weeks IMRT under Dr. Tammi Klippel 01/21/22 - 03/02/22 -f/u with urology      Plan: -Labs reviewed, Hgb 11.5, ferritin is pending -IV Feraheme if ferritin is low  -lab in 3 and 6 months -f/u in 6 months   No  problem-specific Assessment & Plan notes found for this encounter.   SUMMARY OF ONCOLOGIC HISTORY: Oncology History  Malignant neoplasm of prostate ([place])  10/26/2021 Cancer Staging   Staging form: Prostate, AJCC 8th Edition - Clinical stage from 10/26/2021: Stage IIB (cT1c, cN0, cM0, PSA: 6.8, Grade Group: 2) - Signed by Freeman Caldron, PA-C on 01/05/2022 Histopathologic type: Adenocarcinoma, NOS Stage prefix: Initial diagnosis Prostate specific antigen (PSA) range: Less than 10 Gleason primary pattern: 3 Gleason secondary pattern: 4 Gleason score: 7 Histologic grading system: 5 grade system Number of biopsy cores examined: 12 Number of biopsy cores positive: 5 Location of positive needle core biopsies: Both sides    11/15/2021 Initial Diagnosis   Malignant neoplasm of prostate ([place])       INTERVAL HISTORY:  [name] is here for a follow up of anemia. He was last seen by me on 12/24/21. He presents to the clinic accompanied by his wife. He reports he is feeling well overall. He tells me he did well with his prostate radiation.   All other systems were reviewed with the patient and are negative.  MEDICAL HISTORY:  Past Medical History:  Diagnosis Date   Arthritis    BACK PAIN    CAD (coronary artery disease) 06/28/2017   CAD in native artery    a. moderate by cardiac CT 2016.   Chronic bilateral low back pain with bilateral sciatica 01/12/2019   Degenerative arthritis of right knee 07/15/2016   Injected in 07/15/2016 Orthovisc injection started 10/07/2016 DepoMedrol 40 inj on 04/11/17, 25 cc aspirated  Orthovisc injection in 05/30/2017     DEGENERATIVE JOINT DISEASE, KNEES, BILATERAL    Depression    Diabetes ([place]) 10/01/2014   Diabetes mellitus    type II   DIVERTICULOSIS, COLON    DVT, lower extremity ([place])    right   History of kidney stones    Hyperlipidemia    Hypertension    HYPOGONADISM, MALE    HYPOTENSION, ORTHOSTATIC    Kidney stones    Long term  (current) use of anticoagulants 08/17/2017   Long term current use of anticoagulant    Lumbosacral radiculopathy at S1 06/14/2012   Maxillary sinus cyst 12/11/2018   Obesity    OBESITY, MORBID 03/04/2008   Qualifier: Diagnosis of  By: Jenny Reichmann MD, Hunt Oris    OSA (obstructive sleep apnea) 09/11/2013   Pulmonary emboli ([facility])    Pulmonary embolism ([place]) 02/05/2011   RBBB 07/28/2017   Renal insufficiency    a. prior h/o, does not appear chronic.   Superficial phlebitis of arm 03/18/2016   Vitamin D deficiency 07/09/2020    SURGICAL HISTORY: Past Surgical History:  Procedure Laterality Date   ankle surgury  2001 bilateral   with bone spurs and torn tendon   COLONOSCOPY  03/18/2010   EDG  2008   chronic Duodenitis   FLEXIBLE SIGMOIDOSCOPY N/A 11/03/2021   Procedure: FLEXIBLE SIGMOIDOSCOPY;  Surgeon: Doran Stabler, MD;  Location: [facility];  Service: Gastroenterology;  Laterality: N/A;   GASTRIC BYPASS     HEEL SPUR SURGERY Bilateral 2005   IR ANGIOGRAM PELVIS SELECTIVE OR SUPRASELECTIVE  11/02/2021   IR ANGIOGRAM PELVIS SELECTIVE OR SUPRASELECTIVE  11/04/2021   IR ANGIOGRAM SELECTIVE EACH ADDITIONAL VESSEL  11/02/2021   IR ANGIOGRAM SELECTIVE EACH ADDITIONAL VESSEL  11/02/2021   IR ANGIOGRAM SELECTIVE EACH ADDITIONAL VESSEL  11/02/2021   IR ANGIOGRAM SELECTIVE EACH ADDITIONAL VESSEL  11/04/2021   IR ANGIOGRAM SELECTIVE EACH ADDITIONAL VESSEL  11/04/2021   IR ANGIOGRAM SELECTIVE EACH ADDITIONAL VESSEL  11/04/2021   IR ANGIOGRAM SELECTIVE EACH ADDITIONAL VESSEL  11/04/2021   IR ANGIOGRAM VISCERAL SELECTIVE  11/02/2021   IR ANGIOGRAM VISCERAL SELECTIVE  11/04/2021   IR EMBO ART  VEN HEMORR LYMPH EXTRAV  INC GUIDE ROADMAPPING  11/02/2021   IR EMBO ART  VEN HEMORR LYMPH EXTRAV  INC GUIDE ROADMAPPING  11/04/2021   IR EMBO ART  VEN HEMORR LYMPH EXTRAV  INC GUIDE ROADMAPPING  11/04/2021   IR US GUIDE VASC ACCESS RIGHT  11/02/2021   IR US GUIDE [place] RIGHT  11/04/2021   knee surgury  2000  & 2003    cartilage damage   LEFT HEART CATH AND CORONARY ANGIOGRAPHY N/A 02/16/2021   Procedure: LEFT HEART CATH AND CORONARY ANGIOGRAPHY;  Surgeon: Sherren Mocha, MD;  Location: [facility];  Service: Cardiovascular;  Laterality: N/A;   LUMBAR LAMINECTOMY/DECOMPRESSION MICRODISCECTOMY  09/28/2012   Procedure: LUMBAR LAMINECTOMY/DECOMPRESSION MICRODISCECTOMY 2 LEVELS;  Surgeon: [name], MD;  Location: [facility];  Service: Orthopedics;  Laterality: Left;  Decompressive laminectomy L4-L5. Microdiscectomy L5-S1   Skin removal surgery     extra abdominal skin removed from his weight loss   TONSILLECTOMY AND ADENOIDECTOMY  age 49 or 6   TOTAL KNEE ARTHROPLASTY  01/10/2012   Procedure: TOTAL KNEE ARTHROPLASTY;  Surgeon: [name], MD;  Location: [facility];  Service: Orthopedics;  Laterality: Left;   TOTAL KNEE ARTHROPLASTY Right 11/18/2020   Procedure: TOTAL KNEE ARTHROPLASTY;  Surgeon: Paralee Cancel, MD;  Location: [facility];  Service: Orthopedics;  Laterality: Right;  70 mins   UPPER GASTROINTESTINAL ENDOSCOPY  03/18/2010    I have reviewed the social history and family history with the patient and they are unchanged from previous note.  ALLERGIES:  is allergic to adhesive [tape] and other.  MEDICATIONS:  Current Outpatient Medications  Medication Sig Dispense Refill   Artificial Tear Ointment (ARTIFICIAL TEARS) ointment Place 1 drop into both eyes every 4 (four) hours as needed (dry eyes).      atorvastatin (LIPITOR) 80 MG tablet TAKE 1 TABLET BY MOUTH EVERY DAY 90 tablet 2   benzonatate (TESSALON) 200 MG capsule Take 1 capsule (200 mg total) by mouth 3 (three) times daily as needed. 60 capsule 0   chlorthalidone (HYGROTON) 25 MG tablet Take 25 mg by mouth daily.     Cholecalciferol (VITAMIN D3) 125 MCG (5000 UT) CAPS Take 5,000 Units by mouth daily.      diazepam (VALIUM) 5 MG tablet One tab by mouth, 2 hours before procedure. 2 tablet 0   escitalopram (LEXAPRO) 10 MG tablet  TAKE 1 TABLET BY MOUTH EVERY DAY 90 tablet 3   ferrous sulfate 325 (65 FE) MG EC tablet TAKE 1 TABLET BY MOUTH EVERY DAY 90 tablet 1   folic acid (FOLVITE) 1 MG tablet TAKE 1 TABLET BY MOUTH EVERY DAY 90 tablet 1   gabapentin (NEURONTIN) 100 MG capsule TAKE 2 CAPSULES BY MOUTH AT BEDTIME 60 capsule 0   glipiZIDE (GLUCOTROL XL) 2.5 MG 24 hr tablet TAKE 1 TABLET BY MOUTH DAILY WITH BREAKFAST. 90 tablet 3   Lidocaine-Hydrocort, Perianal, 3-0.5 % CREA Use as directed three times per day as needed 28.3 g 1   metFORMIN (GLUCOPHAGE) 500 MG tablet TAKE 4 TABLETS BY MOUTH DAILY WITH BREAKFAST. 360 tablet 3   Multiple Vitamin (MULTIVITAMIN WITH MINERALS) TABS tablet Take 1 tablet by mouth daily at 12 noon.     nitroGLYCERIN (NITROSTAT) 0.4 MG SL tablet Place 1 tablet (0.4 mg total) under the tongue every 5 (five) minutes as needed for chest pain. 90 tablet 3   pantoprazole (PROTONIX) 40 MG tablet TAKE 1 TABLET BY MOUTH EVERY DAY 90 tablet 3   promethazine-dextromethorphan (PROMETHAZINE-DM) 6.25-15 MG/5ML syrup Take 5 mLs by mouth 4 (four) times daily as needed. 118 mL 0   sucralfate (CARAFATE) 1 g tablet Take 1 tablet (1 g total) by mouth 4 (four) times daily -  with meals and at bedtime. 120 tablet 0   vitamin B-12 1000 MCG tablet Take 1 tablet (1,000 mcg total) by mouth daily. 30 tablet 1   No current facility-administered medications for this visit.    PHYSICAL EXAMINATION: ECOG PERFORMANCE STATUS: 1 - Symptomatic but completely ambulatory  Vitals:   04/23/22 0823  BP: 136/80  Pulse: 61  Temp: 98 F (36.7 C)  SpO2: 100%   Wt Readings from Last 3 Encounters:  04/23/22 242 lb 8 oz (110 kg)  04/20/22 237 lb 12.8 oz (107.9 kg)  03/26/22 246 lb (111.6 kg)     GENERAL:alert, no distress and comfortable SKIN: skin color normal, no rashes or significant lesions EYES: normal, Conjunctiva are pink and non-injected, sclera clear  NEURO: alert & oriented x 3 with fluent speech  LABORATORY DATA:   I have reviewed the data as listed    Latest Ref Rng & Units 04/23/2022    8:07 AM 03/28/2022    8:21 AM 02/22/2022    8:41 AM  CBC  WBC 4.0 - 10.5  K/uL 5.2   5.5   5.8    Hemoglobin 13.0 - 17.0 g/dL 11.5   11.9   12.6    Hematocrit 39.0 - 52.0 % 36.0   37.2   39.8    Platelets 150 - 400 K/uL 203   189   172          Latest Ref Rng & Units 03/28/2022    8:21 AM 03/26/2022    2:39 PM 03/23/2022    2:41 PM  CMP  Glucose 70 - 99 mg/dL 128    75    BUN 8 - 23 mg/dL 28    25    Creatinine 0.61 - 1.24 mg/dL 1.08    1.25    Sodium 135 - 145 mmol/L 137    140    Potassium 3.5 - 5.1 mmol/L 4.4    4.8    Chloride 98 - 111 mmol/L 103    99    CO2 22 - 32 mmol/L 29    23    Calcium 8.9 - 10.3 mg/dL 9.1    9.4    Total Protein 6.0 - 8.3 g/dL  7.0     Total Bilirubin 0.2 - 1.2 mg/dL  0.6     Alkaline Phos 39 - 117 U/L  67     AST 0 - 37 U/L  20     ALT 0 - 53 U/L  28         RADIOGRAPHIC STUDIES: I have personally reviewed the radiological images as listed and agreed with the findings in the report. No results found.    No orders of the defined types were placed in this encounter.  All questions were answered. The patient knows to call the clinic with any problems, questions or concerns. No barriers to learning was detected. The total time spent in the appointment was 20 minutes.     Truitt Merle, MD 04/23/2022   I, Wilburn Mylar, am acting as scribe for Truitt Merle, MD.   I have reviewed the above documentation for accuracy and completeness, and I agree with the above.

## 2022-04-26 ENCOUNTER — Encounter: Payer: Self-pay | Admitting: Cardiology

## 2022-04-26 ENCOUNTER — Ambulatory Visit: Payer: HMO | Admitting: Cardiology

## 2022-04-26 VITALS — BP 122/74 | HR 82 | Ht 74.0 in | Wt 241.2 lb

## 2022-04-26 DIAGNOSIS — C61 Malignant neoplasm of prostate: Secondary | ICD-10-CM | POA: Diagnosis not present

## 2022-04-26 DIAGNOSIS — Z8719 Personal history of other diseases of the digestive system: Secondary | ICD-10-CM | POA: Diagnosis not present

## 2022-04-26 DIAGNOSIS — E785 Hyperlipidemia, unspecified: Secondary | ICD-10-CM

## 2022-04-26 DIAGNOSIS — I2510 Atherosclerotic heart disease of native coronary artery without angina pectoris: Secondary | ICD-10-CM

## 2022-04-26 DIAGNOSIS — I10 Essential (primary) hypertension: Secondary | ICD-10-CM

## 2022-04-26 DIAGNOSIS — E119 Type 2 diabetes mellitus without complications: Secondary | ICD-10-CM

## 2022-04-26 DIAGNOSIS — Z86711 Personal history of pulmonary embolism: Secondary | ICD-10-CM

## 2022-04-26 NOTE — Patient Instructions (Signed)

## 2022-05-10 ENCOUNTER — Encounter (INDEPENDENT_AMBULATORY_CARE_PROVIDER_SITE_OTHER): Payer: HMO | Admitting: Ophthalmology

## 2022-05-10 DIAGNOSIS — H33301 Unspecified retinal break, right eye: Secondary | ICD-10-CM

## 2022-05-10 DIAGNOSIS — I10 Essential (primary) hypertension: Secondary | ICD-10-CM

## 2022-05-10 DIAGNOSIS — H43813 Vitreous degeneration, bilateral: Secondary | ICD-10-CM | POA: Diagnosis not present

## 2022-05-10 DIAGNOSIS — H35033 Hypertensive retinopathy, bilateral: Secondary | ICD-10-CM

## 2022-05-13 ENCOUNTER — Telehealth: Payer: Self-pay | Admitting: *Deleted

## 2022-05-18 ENCOUNTER — Ambulatory Visit: Payer: HMO | Admitting: Internal Medicine

## 2022-05-21 ENCOUNTER — Telehealth: Payer: Self-pay | Admitting: Internal Medicine

## 2022-05-21 NOTE — Telephone Encounter (Signed)
Pt called in requesting One Touch test strips to be filled. States that pharmacy requested it a couple weeks and has not heard anything back.  [id number] [place] Phone:  [phone] Fax:  [phone]

## 2022-05-25 ENCOUNTER — Other Ambulatory Visit: Payer: Self-pay | Admitting: Internal Medicine

## 2022-05-25 MED ORDER — ONETOUCH ULTRA VI STRP: ORAL_STRIP | 12 refills | Status: DC

## 2022-05-25 NOTE — Telephone Encounter (Signed)
Pt called back in requesting call back from assistant regarding these strips.   States he is out and it is urgent to have them refilled.

## 2022-05-25 NOTE — Telephone Encounter (Signed)
Test strips have been sent to pharmacy. Patient notified.

## 2022-06-02 ENCOUNTER — Ambulatory Visit: Payer: HMO | Admitting: Physician Assistant

## 2022-06-02 ENCOUNTER — Encounter: Payer: Self-pay | Admitting: Nurse Practitioner

## 2022-06-02 ENCOUNTER — Telehealth: Payer: Self-pay | Admitting: Physician Assistant

## 2022-06-02 ENCOUNTER — Encounter: Payer: Self-pay | Admitting: Physician Assistant

## 2022-06-02 VITALS — BP 118/80 | HR 65 | Ht 74.0 in | Wt 236.0 lb

## 2022-06-02 DIAGNOSIS — R110 Nausea: Secondary | ICD-10-CM

## 2022-06-02 DIAGNOSIS — Z9884 Bariatric surgery status: Secondary | ICD-10-CM

## 2022-06-02 DIAGNOSIS — K219 Gastro-esophageal reflux disease without esophagitis: Secondary | ICD-10-CM

## 2022-06-02 MED ORDER — ONDANSETRON HCL 4 MG PO TABS
4.0000 mg | ORAL_TABLET | Freq: Three times a day (TID) | ORAL | 3 refills | Status: DC | PRN
Start: 2022-06-02 — End: 2024-01-07

## 2022-06-02 NOTE — Patient Instructions (Addendum)
If you are age 68 or older, your body mass index should be between 23-30. Your Body mass index is 30.3 kg/m. If this is out of the aforementioned range listed, please consider follow up with your Primary Care Provider.  If you are age 50 or younger, your body mass index should be between 19-25. Your Body mass index is 30.3 kg/m. If this is out of the aformentioned range listed, please consider follow up with your Primary Care Provider.   You have been scheduled for an endoscopy. Please follow written instructions given to you at your visit today. If you use inhalers (even only as needed), please bring them with you on the day of your procedure.  We have sent the following medications to your pharmacy for you to pick up at your convenience: Zofran 4 mg   The [facility] [place] [facility] providers would like to encourage you to use [facility] to communicate with providers for non-urgent requests or questions.  Due to long hold times on the telephone, sending your provider a message by [facility] may be a faster and more efficient way to get a response.  Please allow 48 business hours for a response.  Please remember that this is for non-urgent requests.   It was a pleasure to see you today!  Thank you for trusting me with your gastrointestinal care!

## 2022-06-02 NOTE — Telephone Encounter (Signed)
Patient called states the pharmacy told him his insurance is not covering the Zofran medication requires PA.

## 2022-06-02 NOTE — Progress Notes (Signed)
Chief Complaint: Dyspepsia and Weight Loss  HPI:    [name] is a 66 year old African-American, with a past medical history as listed below including prostate cancer status post XRT, CAD and previous Roux-en-Y gastric bypass, known to Dr. Carlean Purl, who was referred to me by Biagio Borg, MD for a complaint of dyspepsia and weight loss.      08/06/2020 colonoscopy was normal.  Repeat recommended in 10 years.    09/16/2020 EGD done for epigastric pain with evidence of Roux-en-Y gastrojejunostomy with gastrojejunal anastomosis characterized by healthy-appearing mucosa and normal jejunum.  Biopsies showed reactive gastropathy negative for H. pylori.    11/02/2021-11/06/2021 patient seen in the hospital by our service for GI bleed.  At that time was having bright red blood per rectum and it was thought this was possibly secondary to a prostate biopsy site.  Patient eventually underwent a prophylactic protocol embolization of the proximal superior rectal artery and prophylactic Gelfoam slurry embolization of the anterior division of the right internal iliac artery.    03/24/2022 CT angio of the chest with no evidence of pulmonary embolism, minimal pericardial effusion, fatty liver and gallbladder stones.    Today, the patient presents to clinic accompanied by his wife and tells me that for at least the past year or so he has felt nausea after eating.  Tells me this can happen before he even finishes his meal within 5 to 10 minutes of taking the first bite.  This makes him feel so bad that he just stops eating completely and it can last for a couple of hours.  Sometimes if it gets terrible he will take some Pepto-Bismol which sometimes helps to cut it shorter than it would normally be.  Denies any vomiting.  He has also been on Pantoprazole 40 mg in the morning for some time now but does not feel like this made much change.  Also tried Carafate before meals in the past but this also made no difference.  Associated  symptoms include a weight loss of around 20 pounds over the past 6 months per him.  (Altogether he is lost around 37 pounds since starting treatment for his prostate cancer)    Denies fever, chills, change in bowel habits, abdominal pain or blood in his stool.     Past Medical History:  Diagnosis Date   Arthritis    BACK PAIN    CAD (coronary artery disease) 06/28/2017   CAD in native artery    a. moderate by cardiac CT 2016.   Chronic bilateral low back pain with bilateral sciatica 01/12/2019   Degenerative arthritis of right knee 07/15/2016   Injected in 07/15/2016 Orthovisc injection started 10/07/2016 DepoMedrol 40 inj on 04/11/17, 25 cc aspirated Orthovisc injection in 05/30/2017     DEGENERATIVE JOINT DISEASE, KNEES, BILATERAL    Depression    Diabetes ([place]) 10/01/2014   Diabetes mellitus    type II   DIVERTICULOSIS, COLON    DVT, lower extremity ([place])    right   History of kidney stones    Hyperlipidemia    Hypertension    HYPOGONADISM, MALE    HYPOTENSION, ORTHOSTATIC    Kidney stones    Long term (current) use of anticoagulants 08/17/2017   Long term current use of anticoagulant    Lumbosacral radiculopathy at S1 06/14/2012   Maxillary sinus cyst 12/11/2018   Obesity    OBESITY, MORBID 03/04/2008   Qualifier: Diagnosis of  By: Jenny Reichmann MD, [place] Oris  OSA (obstructive sleep apnea) 09/11/2013   Pulmonary emboli (HCC)    Pulmonary embolism ([place]) 02/05/2011   RBBB 07/28/2017   Renal insufficiency    a. prior h/o, does not appear chronic.   Superficial phlebitis of arm 03/18/2016   Vitamin D deficiency 07/09/2020    Past Surgical History:  Procedure Laterality Date   ankle surgury  2001 bilateral   with bone spurs and torn tendon   COLONOSCOPY  03/18/2010   EDG  2008   chronic Duodenitis   FLEXIBLE SIGMOIDOSCOPY N/A 11/03/2021   Procedure: FLEXIBLE SIGMOIDOSCOPY;  Surgeon: Doran Stabler, MD;  Location: [facility];  Service: Gastroenterology;  Laterality: N/A;   GASTRIC  BYPASS     HEEL SPUR SURGERY Bilateral 2005   IR ANGIOGRAM PELVIS SELECTIVE OR SUPRASELECTIVE  11/02/2021   IR ANGIOGRAM PELVIS SELECTIVE OR SUPRASELECTIVE  11/04/2021   IR ANGIOGRAM SELECTIVE EACH ADDITIONAL VESSEL  11/02/2021   IR ANGIOGRAM SELECTIVE EACH ADDITIONAL VESSEL  11/02/2021   IR ANGIOGRAM SELECTIVE EACH ADDITIONAL VESSEL  11/02/2021   IR ANGIOGRAM SELECTIVE EACH ADDITIONAL VESSEL  11/04/2021   IR ANGIOGRAM SELECTIVE EACH ADDITIONAL VESSEL  11/04/2021   IR ANGIOGRAM SELECTIVE EACH ADDITIONAL VESSEL  11/04/2021   IR ANGIOGRAM SELECTIVE EACH ADDITIONAL VESSEL  11/04/2021   IR ANGIOGRAM VISCERAL SELECTIVE  11/02/2021   IR ANGIOGRAM VISCERAL SELECTIVE  11/04/2021   IR EMBO ART  VEN HEMORR LYMPH EXTRAV  INC GUIDE ROADMAPPING  11/02/2021   IR EMBO ART  VEN HEMORR LYMPH EXTRAV  INC GUIDE ROADMAPPING  11/04/2021   IR EMBO ART  VEN HEMORR LYMPH EXTRAV  INC GUIDE ROADMAPPING  11/04/2021   IR US GUIDE VASC ACCESS RIGHT  11/02/2021   IR US GUIDE [place] RIGHT  11/04/2021   knee surgury  2000 & 2003    cartilage damage   LEFT HEART CATH AND CORONARY ANGIOGRAPHY N/A 02/16/2021   Procedure: LEFT HEART CATH AND CORONARY ANGIOGRAPHY;  Surgeon: Sherren Mocha, MD;  Location: [facility];  Service: Cardiovascular;  Laterality: N/A;   LUMBAR LAMINECTOMY/DECOMPRESSION MICRODISCECTOMY  09/28/2012   Procedure: LUMBAR LAMINECTOMY/DECOMPRESSION MICRODISCECTOMY 2 LEVELS;  Surgeon: [name], MD;  Location: [facility];  Service: Orthopedics;  Laterality: Left;  Decompressive laminectomy L4-L5. Microdiscectomy L5-S1   Skin removal surgery     extra abdominal skin removed from his weight loss   TONSILLECTOMY AND ADENOIDECTOMY  age 36 or 6   TOTAL KNEE ARTHROPLASTY  01/10/2012   Procedure: TOTAL KNEE ARTHROPLASTY;  Surgeon: [name], MD;  Location: [facility];  Service: Orthopedics;  Laterality: Left;   TOTAL KNEE ARTHROPLASTY Right 11/18/2020   Procedure: TOTAL KNEE ARTHROPLASTY;  Surgeon:  Paralee Cancel, MD;  Location: [facility];  Service: Orthopedics;  Laterality: Right;  70 mins   UPPER GASTROINTESTINAL ENDOSCOPY  03/18/2010    Current Outpatient Medications  Medication Sig Dispense Refill   Artificial Tear Ointment (ARTIFICIAL TEARS) ointment Place 1 drop into both eyes every 4 (four) hours as needed (dry eyes).      atorvastatin (LIPITOR) 80 MG tablet TAKE 1 TABLET BY MOUTH EVERY DAY 90 tablet 2   benzonatate (TESSALON) 200 MG capsule Take 1 capsule (200 mg total) by mouth 3 (three) times daily as needed. 60 capsule 0   chlorthalidone (HYGROTON) 25 MG tablet Take 25 mg by mouth daily.     Cholecalciferol (VITAMIN D3) 125 MCG (5000 UT) CAPS Take 5,000 Units by mouth daily.      diazepam (VALIUM) 5  MG tablet One tab by mouth, 2 hours before procedure. 2 tablet 0   escitalopram (LEXAPRO) 10 MG tablet TAKE 1 TABLET BY MOUTH EVERY DAY 90 tablet 3   ferrous sulfate 325 (65 FE) MG EC tablet TAKE 1 TABLET BY MOUTH EVERY DAY 90 tablet 1   folic acid (FOLVITE) 1 MG tablet TAKE 1 TABLET BY MOUTH EVERY DAY 90 tablet 1   gabapentin (NEURONTIN) 100 MG capsule TAKE 2 CAPSULES BY MOUTH AT BEDTIME 60 capsule 0   glipiZIDE (GLUCOTROL XL) 2.5 MG 24 hr tablet TAKE 1 TABLET BY MOUTH DAILY WITH BREAKFAST. 90 tablet 3   glucose blood (ONETOUCH ULTRA) test strip Use as instructed 100 each 12   Lidocaine-Hydrocort, Perianal, 3-0.5 % CREA Use as directed three times per day as needed 28.3 g 1   metFORMIN (GLUCOPHAGE) 500 MG tablet TAKE 4 TABLETS BY MOUTH DAILY WITH BREAKFAST. 360 tablet 3   Multiple Vitamin (MULTIVITAMIN WITH MINERALS) TABS tablet Take 1 tablet by mouth daily at 12 noon.     nitroGLYCERIN (NITROSTAT) 0.4 MG SL tablet Place 1 tablet (0.4 mg total) under the tongue every 5 (five) minutes as needed for chest pain. 90 tablet 3   pantoprazole (PROTONIX) 40 MG tablet TAKE 1 TABLET BY MOUTH EVERY DAY 90 tablet 3   promethazine-dextromethorphan (PROMETHAZINE-DM) 6.25-15 MG/5ML syrup Take 5 mLs  by mouth 4 (four) times daily as needed. 118 mL 0   sucralfate (CARAFATE) 1 g tablet Take 1 tablet (1 g total) by mouth 4 (four) times daily -  with meals and at bedtime. 120 tablet 0   vitamin B-12 1000 MCG tablet Take 1 tablet (1,000 mcg total) by mouth daily. 30 tablet 1   No current facility-administered medications for this visit.    Allergies as of 06/02/2022 - Review Complete 04/26/2022  Allergen Reaction Noted   Adhesive [tape] Other (See Comments) 03/22/2016   Other  09/26/2012    Family History  Problem Relation Age of Onset   Hypertension Mother    Prostate cancer Father    Diabetes Sister        died with DM 02-21-2001, 2 more sisters with diabetes   Heart failure Sister    Diabetes Brother    Diabetes Sister    Diabetes Sister    Colon cancer Neg Hx    Colon polyps Neg Hx    Esophageal cancer Neg Hx    Rectal cancer Neg Hx    Stomach cancer Neg Hx     Social History   Socioeconomic History   Marital status: Married    Spouse name: Not on file   Number of children: 2   Years of education: Not on file   Highest education level: High school graduate  Occupational History   Occupation: Retired    Fish farm manager: Banker  Tobacco Use   Smoking status: Former    Packs/day: 0.25    Years: 5.00    Total pack years: 1.25    Types: Cigarettes    Quit date: 12/06/1978    Years since quitting: 43.5   Smokeless tobacco: Never  Vaping Use   Vaping Use: Never used  Substance and Sexual Activity   Alcohol use: Yes    Alcohol/week: 2.0 standard drinks of alcohol    Types: 2 Standard drinks or equivalent per week   Drug use: No   Sexual activity: Not Currently  Other Topics Concern   Not on file  Social History Narrative   Daily caffeine use  one per day   Protein drinks    Children; a friend and sister that helps   Brother that is around [place])   Son and dtr in [place] w/ wife in [place]   Left handed     Retired from [employer]   Former smoker 2  alcoholic beverages daily no caffeine no drug use no other tobacco at this time   Social Determinants of Radio broadcast assistant Strain: Low Risk  (10/07/2020)   Overall Financial Resource Strain (CARDIA)    Difficulty of Paying Living Expenses: Not hard at all  Food Insecurity: No Food Insecurity (10/07/2020)   Hunger Vital Sign    Worried About Running Out of Food in the Last Year: Never true    [place] in the Last Year: Never true  Transportation Needs: No Transportation Needs (10/07/2020)   PRAPARE - Hydrologist (Medical): No    Lack of Transportation (Non-Medical): No  Physical Activity: Sufficiently Active (10/07/2020)   Exercise Vital Sign    Days of Exercise per Week: 5 days    Minutes of Exercise per Session: 30 min  Stress: No Stress Concern Present (10/07/2020)   [place]    Feeling of Stress : Not at all  Social Connections: [place] (10/07/2020)   Social Connection and Isolation Panel [NHANES]    Frequency of Communication with Friends and Family: More than three times a week    Frequency of Social Gatherings with Friends and Family: More than three times a week    Attends Religious Services: More than 4 times per year    Active Member of [employer] or Organizations: Yes    Attends Music therapist: More than 4 times per year    Marital Status: Married  Human resources officer Violence: Not At Risk (09/26/2018)   Humiliation, Afraid, Rape, and Kick questionnaire    Fear of Current or Ex-Partner: No    Emotionally Abused: No    Physically Abused: No    Sexually Abused: No    Review of Systems:    Constitutional: No weight loss, fever or chills Cardiovascular: No chest pain Respiratory: No SOB Gastrointestinal: See HPI and otherwise negative   Physical Exam:  Vital signs: BP 118/80   Pulse 65   Ht '6\' 2"'$  (1.88 m)   Wt 236 lb (107 kg)   BMI  30.30 kg/m    Constitutional:   Pleasant AA male appears to be in NAD, Well developed, Well nourished, alert and cooperative Respiratory: Respirations even and unlabored. Lungs clear to auscultation bilaterally.   No wheezes, crackles, or rhonchi.  Cardiovascular: Normal S1, S2. No MRG. Regular rate and rhythm. No peripheral edema, cyanosis or pallor.  Gastrointestinal:  Soft, nondistended, nontender. No rebound or guarding. Increased BS all four quadrnats. No appreciable masses or hepatomegaly. Rectal:  Not performed.  Psychiatric: Demonstrates good judgement and reason without abnormal affect or behaviors.  RELEVANT LABS AND IMAGING: CBC    Component Value Date/Time   WBC 5.2 04/23/2022 0807   WBC 5.5 03/28/2022 0821   RBC 5.12 04/23/2022 0807   HGB 11.5 (L) 04/23/2022 0807   HGB 13.0 02/10/2021 1205   HCT 36.0 (L) 04/23/2022 0807   HCT 43.9 02/10/2021 1205   PLT 203 04/23/2022 0807   PLT 285 02/10/2021 1205   MCV 70.3 (L) 04/23/2022 0807   MCV 73 (L) 02/10/2021 1205   MCH  22.5 (L) 04/23/2022 0807   MCHC 31.9 04/23/2022 0807   RDW 16.7 (H) 04/23/2022 0807   RDW 15.9 (H) 02/10/2021 1205   LYMPHSABS 1.4 04/23/2022 0807   MONOABS 0.5 04/23/2022 0807   EOSABS 0.2 04/23/2022 0807   BASOSABS 0.0 04/23/2022 0807    CMP     Component Value Date/Time   NA 137 03/28/2022 0821   NA 140 03/23/2022 1441   K 4.4 03/28/2022 0821   CL 103 03/28/2022 0821   CO2 29 03/28/2022 0821   GLUCOSE 128 (H) 03/28/2022 0821   BUN 28 (H) 03/28/2022 0821   BUN 25 03/23/2022 1441   CREATININE 1.08 03/28/2022 0821   CREATININE 1.22 07/09/2020 1032   CALCIUM 9.1 03/28/2022 0821   PROT 7.0 03/26/2022 1439   ALBUMIN 4.4 03/26/2022 1439   AST 20 03/26/2022 1439   ALT 28 03/26/2022 1439   ALKPHOS 67 03/26/2022 1439   BILITOT 0.6 03/26/2022 1439   GFRNONAA >60 03/28/2022 0821   GFRNONAA 61 07/09/2020 1032   GFRAA 71 07/09/2020 1032    Assessment: 1.  Nausea: Increase in nausea almost  immediately after eating which can last for a few hours over the past year, due to this has not been eating as well and has lost about 20 pounds, previous gastric bypass; consider PUD +/- H. pylori versus gastritis versus functional dyspepsia 2.  GERD  Plan: 1.  Continue Pantoprazole 40 mg every morning, 30-60 minutes before breakfast for now.  May consider increasing this in the future pending results from EGD. 2.  Patient would like an EGD for further evaluation as this has been going on a long time.  CT was scheduled Dr. Carlean Purl in the [facility].  Did provide the patient a detailed list of risks for the procedure and he agrees to proceed. 3.  Has previously been tried on Carafate which did not help.  Could consider Amitriptyline or Trazodone in the future pending results from EGD. 4.  Prescribed Zofran 4 mg 1-2 tabs every 4-6 hours as needed for nausea.  #30 with 3 refills.  Pending results from EGD could also consider scheduling this out prior to meals to help him eat. 5.  Patient to follow in clinic per recommendations from Dr. Carlean Purl after time of procedure  Ellouise Newer, PA-C [facility] 06/02/2022, 8:33 AM  Cc: Biagio Borg, MD

## 2022-06-03 NOTE — Telephone Encounter (Signed)
Patient called stating that he is going out of town tomorrow and would like an update. Please advise.

## 2022-06-03 NOTE — Telephone Encounter (Signed)
Received denial from insurance for Zofran: Denial letter states they will cover for Chemotherapy: Chemotherapy or Radiation induced nausea/vomiting or prophylaxis of post-surgical nausea/vomiting. Faxed patient's oncology notes as expedited appeal. Awaiting response.

## 2022-06-04 ENCOUNTER — Other Ambulatory Visit: Payer: Self-pay | Admitting: Nurse Practitioner

## 2022-06-04 DIAGNOSIS — E538 Deficiency of other specified B group vitamins: Secondary | ICD-10-CM

## 2022-06-15 ENCOUNTER — Telehealth: Payer: Self-pay

## 2022-06-15 DIAGNOSIS — E119 Type 2 diabetes mellitus without complications: Secondary | ICD-10-CM | POA: Diagnosis not present

## 2022-06-15 DIAGNOSIS — I10 Essential (primary) hypertension: Secondary | ICD-10-CM | POA: Diagnosis not present

## 2022-06-15 DIAGNOSIS — Z7984 Long term (current) use of oral hypoglycemic drugs: Secondary | ICD-10-CM | POA: Diagnosis not present

## 2022-06-15 DIAGNOSIS — I2510 Atherosclerotic heart disease of native coronary artery without angina pectoris: Secondary | ICD-10-CM | POA: Diagnosis not present

## 2022-06-15 DIAGNOSIS — Z8546 Personal history of malignant neoplasm of prostate: Secondary | ICD-10-CM | POA: Diagnosis not present

## 2022-06-15 NOTE — Telephone Encounter (Signed)
Prior Authorization for ONDANSETRON '4MG'$  has been approved.  Key: [id number] Effective dates: 06/03/22 through 12/05/22

## 2022-06-25 ENCOUNTER — Encounter: Payer: Self-pay | Admitting: Internal Medicine

## 2022-06-25 ENCOUNTER — Ambulatory Visit (AMBULATORY_SURGERY_CENTER): Payer: HMO | Admitting: Internal Medicine

## 2022-06-25 ENCOUNTER — Ambulatory Visit (INDEPENDENT_AMBULATORY_CARE_PROVIDER_SITE_OTHER): Payer: HMO | Admitting: Internal Medicine

## 2022-06-25 VITALS — BP 115/66 | HR 46 | Temp 97.5°F | Resp 14 | Ht 74.0 in | Wt 236.0 lb

## 2022-06-25 VITALS — BP 120/82 | HR 64 | Temp 97.8°F | Resp 18 | Ht 72.0 in | Wt 239.8 lb

## 2022-06-25 DIAGNOSIS — E559 Vitamin D deficiency, unspecified: Secondary | ICD-10-CM | POA: Diagnosis not present

## 2022-06-25 DIAGNOSIS — K219 Gastro-esophageal reflux disease without esophagitis: Secondary | ICD-10-CM

## 2022-06-25 DIAGNOSIS — E1122 Type 2 diabetes mellitus with diabetic chronic kidney disease: Secondary | ICD-10-CM

## 2022-06-25 DIAGNOSIS — E538 Deficiency of other specified B group vitamins: Secondary | ICD-10-CM

## 2022-06-25 DIAGNOSIS — R110 Nausea: Secondary | ICD-10-CM

## 2022-06-25 DIAGNOSIS — Z125 Encounter for screening for malignant neoplasm of prostate: Secondary | ICD-10-CM

## 2022-06-25 DIAGNOSIS — N183 Chronic kidney disease, stage 3 unspecified: Secondary | ICD-10-CM

## 2022-06-25 DIAGNOSIS — K634 Enteroptosis: Secondary | ICD-10-CM

## 2022-06-25 DIAGNOSIS — N1831 Chronic kidney disease, stage 3a: Secondary | ICD-10-CM | POA: Diagnosis not present

## 2022-06-25 DIAGNOSIS — R1013 Epigastric pain: Secondary | ICD-10-CM

## 2022-06-25 DIAGNOSIS — K2970 Gastritis, unspecified, without bleeding: Secondary | ICD-10-CM

## 2022-06-25 MED ORDER — SODIUM CHLORIDE 0.9 % IV SOLN: 500.0000 mL | Freq: Once | INTRAVENOUS | Status: DC

## 2022-06-25 MED ORDER — OMEPRAZOLE 40 MG PO CPDR: 40.0000 mg | DELAYED_RELEASE_CAPSULE | Freq: Every day | ORAL | 3 refills | Status: DC

## 2022-06-25 MED ORDER — MIRTAZAPINE 15 MG PO TABS: 15.0000 mg | ORAL_TABLET | Freq: Every day | ORAL | 0 refills | Status: DC

## 2022-06-25 NOTE — Progress Notes (Unsigned)
Patient ID: [name], male   DOB: 1956-09-12, 66 y.o.   MRN: [id number]        Chief Complaint: follow up HTN, HLD and hyperglycemia ***       HPI:  [name] is a 66 y.o. male here with c/o        Wt Readings from Last 3 Encounters:  06/25/22 239 lb 12.8 oz (108.8 kg)  06/25/22 236 lb (107 kg)  06/02/22 236 lb (107 kg)   BP Readings from Last 3 Encounters:  06/25/22 120/82  06/25/22 115/66  06/02/22 118/80         Past Medical History:  Diagnosis Date   Arthritis    BACK PAIN    CAD (coronary artery disease) 06/28/2017   CAD in native artery    a. moderate by cardiac CT 2016.   [facility])    Chronic bilateral low back pain with bilateral sciatica 01/12/2019   Degenerative arthritis of right knee 07/15/2016   Injected in 07/15/2016 Orthovisc injection started 10/07/2016 DepoMedrol 40 inj on 04/11/17, 25 cc aspirated Orthovisc injection in 05/30/2017     DEGENERATIVE JOINT DISEASE, KNEES, BILATERAL    Depression    Diabetes ([place]) 10/01/2014   Diabetes mellitus    type II   DIVERTICULOSIS, COLON    DVT, lower extremity (HCC)    right   GERD (gastroesophageal reflux disease)    History of kidney stones    Hyperlipidemia    Hypertension    HYPOGONADISM, MALE    HYPOTENSION, ORTHOSTATIC    Kidney stones    Long term (current) use of anticoagulants 08/17/2017   Long term current use of anticoagulant    Lumbosacral radiculopathy at S1 06/14/2012   Maxillary sinus cyst 12/11/2018   Obesity    OBESITY, MORBID 03/04/2008   Qualifier: Diagnosis of  By: Jenny Reichmann MD, Hunt Oris    OSA (obstructive sleep apnea) 09/11/2013   Pulmonary emboli (HCC)    Pulmonary embolism ([place]) 02/05/2011   RBBB 07/28/2017   Renal insufficiency    a. prior h/o, does not appear chronic.   Superficial phlebitis of arm 03/18/2016   Vitamin D deficiency 07/09/2020   Past Surgical History:  Procedure Laterality Date   ankle surgury  2001 bilateral   with bone spurs and torn tendon    COLONOSCOPY  03/18/2010   EDG  2008   chronic Duodenitis   FLEXIBLE SIGMOIDOSCOPY N/A 11/03/2021   Procedure: FLEXIBLE SIGMOIDOSCOPY;  Surgeon: Doran Stabler, MD;  Location: [facility];  Service: Gastroenterology;  Laterality: N/A;   GASTRIC BYPASS     HEEL SPUR SURGERY Bilateral 2005   IR ANGIOGRAM PELVIS SELECTIVE OR SUPRASELECTIVE  11/02/2021   IR ANGIOGRAM PELVIS SELECTIVE OR SUPRASELECTIVE  11/04/2021   IR ANGIOGRAM SELECTIVE EACH ADDITIONAL VESSEL  11/02/2021   IR ANGIOGRAM SELECTIVE EACH ADDITIONAL VESSEL  11/02/2021   IR ANGIOGRAM SELECTIVE EACH ADDITIONAL VESSEL  11/02/2021   IR ANGIOGRAM SELECTIVE EACH ADDITIONAL VESSEL  11/04/2021   IR ANGIOGRAM SELECTIVE EACH ADDITIONAL VESSEL  11/04/2021   IR ANGIOGRAM SELECTIVE EACH ADDITIONAL VESSEL  11/04/2021   IR ANGIOGRAM SELECTIVE EACH ADDITIONAL VESSEL  11/04/2021   IR ANGIOGRAM VISCERAL SELECTIVE  11/02/2021   IR ANGIOGRAM VISCERAL SELECTIVE  11/04/2021   IR EMBO ART  VEN HEMORR LYMPH EXTRAV  INC GUIDE ROADMAPPING  11/02/2021   IR EMBO ART  VEN HEMORR LYMPH EXTRAV  INC GUIDE ROADMAPPING  11/04/2021   IR EMBO ART  VEN HEMORR LYMPH  EXTRAV  INC GUIDE ROADMAPPING  11/04/2021   IR US GUIDE VASC ACCESS RIGHT  11/02/2021   IR US GUIDE VASC ACCESS RIGHT  11/04/2021   knee surgury  2000 & 2003    cartilage damage   LEFT HEART CATH AND CORONARY ANGIOGRAPHY N/A 02/16/2021   Procedure: LEFT HEART CATH AND CORONARY ANGIOGRAPHY;  Surgeon: Sherren Mocha, MD;  Location: [place] [facility];  Service: Cardiovascular;  Laterality: N/A;   LUMBAR LAMINECTOMY/DECOMPRESSION MICRODISCECTOMY  09/28/2012   Procedure: LUMBAR LAMINECTOMY/DECOMPRESSION MICRODISCECTOMY 2 LEVELS;  Surgeon: [name], MD;  Location: [facility];  Service: Orthopedics;  Laterality: Left;  Decompressive laminectomy L4-L5. Microdiscectomy L5-S1   Skin removal surgery     extra abdominal skin removed from his weight loss   TONSILLECTOMY AND ADENOIDECTOMY  age 5 or 6    TOTAL KNEE ARTHROPLASTY  01/10/2012   Procedure: TOTAL KNEE ARTHROPLASTY;  Surgeon: [name], MD;  Location: [facility];  Service: Orthopedics;  Laterality: Left;   TOTAL KNEE ARTHROPLASTY Right 11/18/2020   Procedure: TOTAL KNEE ARTHROPLASTY;  Surgeon: Paralee Cancel, MD;  Location: [facility];  Service: Orthopedics;  Laterality: Right;  70 mins   UPPER GASTROINTESTINAL ENDOSCOPY  03/18/2010    reports that he quit smoking about 43 years ago. His smoking use included cigarettes. He has a 1.25 pack-year smoking history. He has never used smokeless tobacco. He reports current alcohol use of about 2.0 standard drinks of alcohol per week. He reports that he does not use drugs. family history includes Diabetes in his brother, sister, sister, and sister; Heart failure in his sister; Hypertension in his mother; Prostate cancer in his father. Allergies  Allergen Reactions   Adhesive [Tape] Other (See Comments)    Painful, Please use "paper" tape   Other     NO BLOOD -JEHOVAH'S WITNESS-SIGNED REFUSAL BUT WOULD TAKE ALBUMIN   Current Outpatient Medications on File Prior to Visit  Medication Sig Dispense Refill   Artificial Tear Ointment (ARTIFICIAL TEARS) ointment Place 1 drop into both eyes every 4 (four) hours as needed (dry eyes).      atorvastatin (LIPITOR) 80 MG tablet TAKE 1 TABLET BY MOUTH EVERY DAY 90 tablet 2   chlorthalidone (HYGROTON) 25 MG tablet Take 25 mg by mouth daily.     Cholecalciferol (VITAMIN D3) 125 MCG (5000 UT) CAPS Take 5,000 Units by mouth daily.      diazepam (VALIUM) 5 MG tablet One tab by mouth, 2 hours before procedure. 2 tablet 0   ferrous sulfate 325 (65 FE) MG EC tablet TAKE 1 TABLET BY MOUTH EVERY DAY 90 tablet 1   folic acid (FOLVITE) 1 MG tablet TAKE 1 TABLET BY MOUTH EVERY DAY 90 tablet 1   glipiZIDE (GLUCOTROL XL) 2.5 MG 24 hr tablet TAKE 1 TABLET BY MOUTH DAILY WITH BREAKFAST. 90 tablet 3   metFORMIN (GLUCOPHAGE) 500 MG tablet TAKE 4 TABLETS BY MOUTH DAILY WITH  BREAKFAST. 360 tablet 3   Multiple Vitamin (MULTIVITAMIN WITH MINERALS) TABS tablet Take 1 tablet by mouth daily at 12 noon.     nitroGLYCERIN (NITROSTAT) 0.4 MG SL tablet Place 1 tablet (0.4 mg total) under the tongue every 5 (five) minutes as needed for chest pain. 90 tablet 3   ondansetron (ZOFRAN) 4 MG tablet Take 1 tablet (4 mg total) by mouth every 8 (eight) hours as needed for nausea or vomiting. 30 tablet 3   vitamin B-12 1000 MCG tablet Take 1 tablet (1,000 mcg total) by mouth daily.  30 tablet 1   benzonatate (TESSALON) 200 MG capsule Take 1 capsule (200 mg total) by mouth 3 (three) times daily as needed. (Patient not taking: Reported on 06/25/2022) 60 capsule 0   gabapentin (NEURONTIN) 100 MG capsule TAKE 2 CAPSULES BY MOUTH AT BEDTIME (Patient not taking: Reported on 06/25/2022) 60 capsule 0   glucose blood (ONETOUCH ULTRA) test strip Use as instructed 100 each 12   Lidocaine-Hydrocort, Perianal, 3-0.5 % CREA Use as directed three times per day as needed 28.3 g 1   No current facility-administered medications on file prior to visit.        ROS:  All others reviewed and negative.  Objective        PE:  BP 120/82 (BP Location: Left Arm, Patient Position: Sitting, Cuff Size: Large)   Pulse 64   Temp 97.8 F (36.6 C) (Oral)   Resp 18   Ht 6' (1.829 m)   Wt 239 lb 12.8 oz (108.8 kg)   SpO2 96%   BMI 32.52 kg/m                 Constitutional: Pt appears in NAD               HENT: Head: NCAT.                Right Ear: External ear normal.                 Left Ear: External ear normal.                Eyes: . Pupils are equal, round, and reactive to light. Conjunctivae and EOM are normal               Nose: without d/c or deformity               Neck: Neck supple. Gross normal ROM               Cardiovascular: Normal rate and regular rhythm.                 Pulmonary/Chest: Effort normal and breath sounds without rales or wheezing.                Abd:  Soft, NT, ND, + BS, no  organomegaly               Neurological: Pt is alert. At baseline orientation, motor grossly intact               Skin: Skin is warm. No rashes, no other new lesions, LE edema - ***               Psychiatric: Pt behavior is normal without agitation   Micro: none  Cardiac tracings I have personally interpreted today:  none  Pertinent Radiological findings (summarize): none   Lab Results  Component Value Date   WBC 5.2 04/23/2022   HGB 11.5 (L) 04/23/2022   HCT 36.0 (L) 04/23/2022   PLT 203 04/23/2022   GLUCOSE 128 (H) 03/28/2022   CHOL 91 03/26/2022   TRIG 55.0 03/26/2022   HDL 41.00 03/26/2022   LDLDIRECT 116.0 01/15/2020   LDLCALC 39 03/26/2022   ALT 28 03/26/2022   AST 20 03/26/2022   NA 137 03/28/2022   K 4.4 03/28/2022   CL 103 03/28/2022   CREATININE 1.08 03/28/2022   BUN 28 (H) 03/28/2022   CO2 29 03/28/2022   TSH 1.80 03/26/2022   PSA 5.05 (  H) 09/11/2021   INR 1.2 11/03/2021   HGBA1C 7.3 (H) 03/26/2022   MICROALBUR 1.6 03/10/2021   Assessment/Plan:  NIKO PENSON is a 66 y.o. Black or African American [2] male with  has a past medical history of Arthritis, BACK PAIN, CAD (coronary artery disease) (06/28/2017), CAD in native artery, Cancer ([place]), Chronic bilateral low back pain with bilateral sciatica (01/12/2019), Degenerative arthritis of right knee (07/15/2016), DEGENERATIVE JOINT DISEASE, KNEES, BILATERAL, Depression, Diabetes ([place]) (10/01/2014), Diabetes mellitus, DIVERTICULOSIS, COLON, DVT, lower extremity ([place]), GERD (gastroesophageal reflux disease), History of kidney stones, Hyperlipidemia, Hypertension, HYPOGONADISM, MALE, HYPOTENSION, ORTHOSTATIC, Kidney stones, Long term (current) use of anticoagulants (08/17/2017), Long term current use of anticoagulant, Lumbosacral radiculopathy at S1 (06/14/2012), Maxillary sinus cyst (12/11/2018), Obesity, OBESITY, MORBID (03/04/2008), OSA (obstructive sleep apnea) (09/11/2013), Pulmonary emboli ([place]), Pulmonary embolism  ([place]) (02/05/2011), RBBB (07/28/2017), Renal insufficiency, Superficial phlebitis of arm (03/18/2016), and Vitamin D deficiency (07/09/2020).  No problem-specific Assessment & Plan notes found for this encounter.  Followup: No follow-ups on file.  Cathlean Cower, MD 06/25/2022 2:49 PM [facility]

## 2022-06-25 NOTE — Op Note (Addendum)
[facility] Patient Name: [name] Procedure Date: 06/25/2022 8:23 AM MRN: [id number] Endoscopist: Gatha Mayer , MD Age: 66 Referring MD:  Date of Birth: 09-22-1956 Gender: Male Account #: [id number] Procedure:                Upper GI endoscopy Indications:              Nausea, Weight loss Medicines:                Monitored Anesthesia Care Procedure:                Pre-Anesthesia Assessment:                           - Prior to the procedure, a History and Physical                            was performed, and patient medications and                            allergies were reviewed. The patient's tolerance of                            previous anesthesia was also reviewed. The risks                            and benefits of the procedure and the sedation                            options and risks were discussed with the patient.                            All questions were answered, and informed consent                            was obtained. Prior Anticoagulants: The patient has                            taken no previous anticoagulant or antiplatelet                            agents. ASA Grade Assessment: III - A patient with                            severe systemic disease. After reviewing the risks                            and benefits, the patient was deemed in                            satisfactory condition to undergo the procedure.                           After obtaining informed consent, the endoscope was  passed under direct vision. Throughout the                            procedure, the patient's blood pressure, pulse, and                            oxygen saturations were monitored continuously. The                            GIF [id number] #[id number] was introduced through the                            mouth, and advanced to the efferent jejunal loop.                            The upper GI endoscopy was  accomplished without                            difficulty. The patient tolerated the procedure                            well. Scope In: Scope Out: Findings:                 The examined esophagus was normal.                           Evidence of a gastric bypass was found. A gastric                            pouch with a normal size was found. The                            gastrojejunal anastomosis was characterized by                            healthy appearing mucosa. This was traversed.                           The examined jejunum was normal. Complications:            No immediate complications. Estimated Blood Loss:     Estimated blood loss: none. Impression:               - Normal esophagus.                           - Gastric bypass with a normal-sized pouch.                            Gastrojejunal anastomosis characterized by healthy                            appearing mucosa.                           - Normal examined jejunum.                           -  No specimens collected. Recommendation:           - Patient has a contact number available for                            emergencies. The signs and symptoms of potential                            delayed complications were discussed with the                            patient. Return to normal activities tomorrow.                            Written discharge instructions were provided to the                            patient.                           - Resume previous diet.                           - No cause of nausea and weight loss found. - he                            has had CT angio abdomen late last year and no                            signes of mesenteric ischemia or other problmes.                           He has stopped lexapro                           Will change pantoprazole to omeprazole capsule                            (pantoprazole tablets will often not absorb                             properly post gastric bypass)                           If this does not help would consider mirtazapine -                            DISCUSSION IN RECOVERY - NOT SLEEPING WELL, NAUSEA                            AS STATED - WILL TRY MIRTAZAPINE 15 MG QHS                           PATIENT NEEDS TO CALL AND GET 2 MONTH F/U ME Gatha Mayer, MD 06/25/2022 8:58:43 AM This report  has been signed electronically.

## 2022-06-25 NOTE — Progress Notes (Signed)
[facility] History and Physical   Primary Care Physician:  Biagio Borg, MD   Reason for Procedure:   nausea  Plan:    EGD     HPI: [name] is a 66 y.o. male  with a past medical history as listed below including prostate cancer status post XRT, CAD and previous Roux-en-Y gastric bypass, known to Dr. Carlean Purl, who was referred to me by Biagio Borg, MD for a complaint of dyspepsia and weight loss.      08/06/2020 colonoscopy was normal.  Repeat recommended in 10 years.    09/16/2020 EGD done for epigastric pain with evidence of Roux-en-Y gastrojejunostomy with gastrojejunal anastomosis characterized by healthy-appearing mucosa and normal jejunum.  Biopsies showed reactive gastropathy negative for H. pylori.    11/02/2021-11/06/2021 patient seen in the hospital by our service for GI bleed.  At that time was having bright red blood per rectum and it was thought this was possibly secondary to a prostate biopsy site.  Patient eventually underwent a prophylactic protocol embolization of the proximal superior rectal artery and prophylactic Gelfoam slurry embolization of the anterior division of the right internal iliac artery.    03/24/2022 CT angio of the chest with no evidence of pulmonary embolism, minimal pericardial effusion, fatty liver and gallbladder stones. For at least the past year or so he has felt nausea after eating.  This can happen before he even finishes his meal within 5 to 10 minutes of taking the first bite.  This makes him feel so bad that he just stops eating completely and it can last for a couple of hours.  Sometimes if it gets terrible he will take some Pepto-Bismol which sometimes helps to cut it shorter than it would normally be.  Denies any vomiting.  He has also been on Pantoprazole 40 mg in the morning for some time now but does not feel like this made much change.  Also tried Carafate before meals in the past but this also made no difference.  Associated  symptoms include a weight loss of around 20 pounds over the past 6 months per him.  (Altogether he is lost around 37 pounds since starting treatment for his prostate cancer)    Denies fever, chills, change in bowel habits, abdominal pain or blood in his stool.  Wt Readings from Last 3 Encounters:  06/25/22 236 lb (107 kg)  06/02/22 236 lb (107 kg)  04/26/22 241 lb 3.2 oz (109.4 kg)     Past Medical History:  Diagnosis Date   Arthritis    BACK PAIN    CAD (coronary artery disease) 06/28/2017   CAD in native artery    a. moderate by cardiac CT 2016.   Cancer [facility])    Chronic bilateral low back pain with bilateral sciatica 01/12/2019   Degenerative arthritis of right knee 07/15/2016   Injected in 07/15/2016 Orthovisc injection started 10/07/2016 DepoMedrol 40 inj on 04/11/17, 25 cc aspirated Orthovisc injection in 05/30/2017     DEGENERATIVE JOINT DISEASE, KNEES, BILATERAL    Depression    Diabetes ([place]) 10/01/2014   Diabetes mellitus    type II   DIVERTICULOSIS, COLON    DVT, lower extremity (HCC)    right   GERD (gastroesophageal reflux disease)    History of kidney stones    Hyperlipidemia    Hypertension    HYPOGONADISM, MALE    HYPOTENSION, ORTHOSTATIC    Kidney stones    Long term (current) use of anticoagulants 08/17/2017  Long term current use of anticoagulant    Lumbosacral radiculopathy at S1 06/14/2012   Maxillary sinus cyst 12/11/2018   Obesity    OBESITY, MORBID 03/04/2008   Qualifier: Diagnosis of  By: Jenny Reichmann MD, Hunt Oris    OSA (obstructive sleep apnea) 09/11/2013   Pulmonary emboli (HCC)    Pulmonary embolism ([place]) 02/05/2011   RBBB 07/28/2017   Renal insufficiency    a. prior h/o, does not appear chronic.   Superficial phlebitis of arm 03/18/2016   Vitamin D deficiency 07/09/2020    Past Surgical History:  Procedure Laterality Date   ankle surgury  2001 bilateral   with bone spurs and torn tendon   COLONOSCOPY  03/18/2010   EDG  2008   chronic  Duodenitis   FLEXIBLE SIGMOIDOSCOPY N/A 11/03/2021   Procedure: FLEXIBLE SIGMOIDOSCOPY;  Surgeon: Doran Stabler, MD;  Location: [facility];  Service: Gastroenterology;  Laterality: N/A;   GASTRIC BYPASS     HEEL SPUR SURGERY Bilateral 2005   IR ANGIOGRAM PELVIS SELECTIVE OR SUPRASELECTIVE  11/02/2021   IR ANGIOGRAM PELVIS SELECTIVE OR SUPRASELECTIVE  11/04/2021   IR ANGIOGRAM SELECTIVE EACH ADDITIONAL VESSEL  11/02/2021   IR ANGIOGRAM SELECTIVE EACH ADDITIONAL VESSEL  11/02/2021   IR ANGIOGRAM SELECTIVE EACH ADDITIONAL VESSEL  11/02/2021   IR ANGIOGRAM SELECTIVE EACH ADDITIONAL VESSEL  11/04/2021   IR ANGIOGRAM SELECTIVE EACH ADDITIONAL VESSEL  11/04/2021   IR ANGIOGRAM SELECTIVE EACH ADDITIONAL VESSEL  11/04/2021   IR ANGIOGRAM SELECTIVE EACH ADDITIONAL VESSEL  11/04/2021   IR ANGIOGRAM VISCERAL SELECTIVE  11/02/2021   IR ANGIOGRAM VISCERAL SELECTIVE  11/04/2021   IR EMBO ART  VEN HEMORR LYMPH EXTRAV  INC GUIDE ROADMAPPING  11/02/2021   IR EMBO ART  VEN HEMORR LYMPH EXTRAV  INC GUIDE ROADMAPPING  11/04/2021   IR EMBO ART  VEN HEMORR LYMPH EXTRAV  INC GUIDE ROADMAPPING  11/04/2021   IR US GUIDE VASC ACCESS RIGHT  11/02/2021   IR US GUIDE [place] RIGHT  11/04/2021   knee surgury  2000 & 2003    cartilage damage   LEFT HEART CATH AND CORONARY ANGIOGRAPHY N/A 02/16/2021   Procedure: LEFT HEART CATH AND CORONARY ANGIOGRAPHY;  Surgeon: Sherren Mocha, MD;  Location: [facility];  Service: Cardiovascular;  Laterality: N/A;   LUMBAR LAMINECTOMY/DECOMPRESSION MICRODISCECTOMY  09/28/2012   Procedure: LUMBAR LAMINECTOMY/DECOMPRESSION MICRODISCECTOMY 2 LEVELS;  Surgeon: [name], MD;  Location: [facility];  Service: Orthopedics;  Laterality: Left;  Decompressive laminectomy L4-L5. Microdiscectomy L5-S1   Skin removal surgery     extra abdominal skin removed from his weight loss   TONSILLECTOMY AND ADENOIDECTOMY  age 74 or 6   TOTAL KNEE ARTHROPLASTY  01/10/2012   Procedure:  TOTAL KNEE ARTHROPLASTY;  Surgeon: [name], MD;  Location: [facility];  Service: Orthopedics;  Laterality: Left;   TOTAL KNEE ARTHROPLASTY Right 11/18/2020   Procedure: TOTAL KNEE ARTHROPLASTY;  Surgeon: Paralee Cancel, MD;  Location: [facility];  Service: Orthopedics;  Laterality: Right;  70 mins   UPPER GASTROINTESTINAL ENDOSCOPY  03/18/2010    Prior to Admission medications   Medication Sig Start Date End Date Taking? Authorizing Provider  atorvastatin (LIPITOR) 80 MG tablet TAKE 1 TABLET BY MOUTH EVERY DAY 12/09/21  Yes Biagio Borg, MD  chlorthalidone (HYGROTON) 25 MG tablet Take 25 mg by mouth daily. 11/21/21  Yes [provider]  Cholecalciferol (VITAMIN D3) 125 MCG (5000 UT) CAPS Take 5,000 Units by mouth daily.  Yes [provider]  ferrous sulfate 325 (65 FE) MG EC tablet TAKE 1 TABLET BY MOUTH EVERY DAY 06/04/22  Yes Truitt Merle, MD  folic acid (FOLVITE) 1 MG tablet TAKE 1 TABLET BY MOUTH EVERY DAY 06/04/22  Yes Truitt Merle, MD  glipiZIDE (GLUCOTROL XL) 2.5 MG 24 hr tablet TAKE 1 TABLET BY MOUTH DAILY WITH BREAKFAST. 11/30/21  Yes Biagio Borg, MD  glucose blood [facility] ULTRA) test strip Use as instructed 05/25/22  Yes Biagio Borg, MD  metFORMIN (GLUCOPHAGE) 500 MG tablet TAKE 4 TABLETS BY MOUTH DAILY WITH BREAKFAST. 11/30/21  Yes Biagio Borg, MD  Multiple Vitamin (MULTIVITAMIN WITH MINERALS) TABS tablet Take 1 tablet by mouth daily at 12 noon.   Yes [provider]  ondansetron (ZOFRAN) 4 MG tablet Take 1 tablet (4 mg total) by mouth every 8 (eight) hours as needed for nausea or vomiting. 06/02/22  Yes Levin Erp, PA  pantoprazole (PROTONIX) 40 MG tablet TAKE 1 TABLET BY MOUTH EVERY DAY 03/27/22  Yes Biagio Borg, MD  sucralfate (CARAFATE) 1 g tablet Take 1 tablet (1 g total) by mouth 4 (four) times daily -  with meals and at bedtime. 03/26/22  Yes Biagio Borg, MD  vitamin B-12 1000 MCG tablet Take 1 tablet (1,000 mcg total) by mouth daily.  11/08/21  Yes Hosie Poisson, MD  Artificial Tear Ointment (ARTIFICIAL TEARS) ointment Place 1 drop into both eyes every 4 (four) hours as needed (dry eyes).     [provider]  benzonatate (TESSALON) 200 MG capsule Take 1 capsule (200 mg total) by mouth 3 (three) times daily as needed. Patient not taking: Reported on 06/02/2022 04/20/22   Hoyt Koch, MD  diazepam (VALIUM) 5 MG tablet One tab by mouth, 2 hours before procedure. Patient not taking: Reported on 06/02/2022 12/02/21   Lyndal Pulley, DO  escitalopram (LEXAPRO) 10 MG tablet TAKE 1 TABLET BY MOUTH EVERY DAY Patient not taking: Reported on 06/02/2022 03/27/22   Biagio Borg, MD  gabapentin (NEURONTIN) 100 MG capsule TAKE 2 CAPSULES BY MOUTH AT BEDTIME 12/07/21   Lyndal Pulley, DO  Lidocaine-Hydrocort, Perianal, 3-0.5 % CREA Use as directed three times per day as needed 03/26/22   Biagio Borg, MD  nitroGLYCERIN (NITROSTAT) 0.4 MG SL tablet Place 1 tablet (0.4 mg total) under the tongue every 5 (five) minutes as needed for chest pain. 07/30/15   Dorothy Spark, MD  promethazine-dextromethorphan (PROMETHAZINE-DM) 6.25-15 MG/5ML syrup Take 5 mLs by mouth 4 (four) times daily as needed. Patient not taking: Reported on 06/02/2022 04/20/22   Hoyt Koch, MD    Current Outpatient Medications  Medication Sig Dispense Refill   atorvastatin (LIPITOR) 80 MG tablet TAKE 1 TABLET BY MOUTH EVERY DAY 90 tablet 2   chlorthalidone (HYGROTON) 25 MG tablet Take 25 mg by mouth daily.     Cholecalciferol (VITAMIN D3) 125 MCG (5000 UT) CAPS Take 5,000 Units by mouth daily.      ferrous sulfate 325 (65 FE) MG EC tablet TAKE 1 TABLET BY MOUTH EVERY DAY 90 tablet 1   folic acid (FOLVITE) 1 MG tablet TAKE 1 TABLET BY MOUTH EVERY DAY 90 tablet 1   glipiZIDE (GLUCOTROL XL) 2.5 MG 24 hr tablet TAKE 1 TABLET BY MOUTH DAILY WITH BREAKFAST. 90 tablet 3   glucose blood (ONETOUCH ULTRA) test strip Use as instructed 100 each 12    metFORMIN (GLUCOPHAGE) 500 MG tablet TAKE 4 TABLETS  BY MOUTH DAILY WITH BREAKFAST. 360 tablet 3   Multiple Vitamin (MULTIVITAMIN WITH MINERALS) TABS tablet Take 1 tablet by mouth daily at 12 noon.     ondansetron (ZOFRAN) 4 MG tablet Take 1 tablet (4 mg total) by mouth every 8 (eight) hours as needed for nausea or vomiting. 30 tablet 3   pantoprazole (PROTONIX) 40 MG tablet TAKE 1 TABLET BY MOUTH EVERY DAY 90 tablet 3   sucralfate (CARAFATE) 1 g tablet Take 1 tablet (1 g total) by mouth 4 (four) times daily -  with meals and at bedtime. 120 tablet 0   vitamin B-12 1000 MCG tablet Take 1 tablet (1,000 mcg total) by mouth daily. 30 tablet 1   Artificial Tear Ointment (ARTIFICIAL TEARS) ointment Place 1 drop into both eyes every 4 (four) hours as needed (dry eyes).      benzonatate (TESSALON) 200 MG capsule Take 1 capsule (200 mg total) by mouth 3 (three) times daily as needed. (Patient not taking: Reported on 06/02/2022) 60 capsule 0   diazepam (VALIUM) 5 MG tablet One tab by mouth, 2 hours before procedure. (Patient not taking: Reported on 06/02/2022) 2 tablet 0   escitalopram (LEXAPRO) 10 MG tablet TAKE 1 TABLET BY MOUTH EVERY DAY (Patient not taking: Reported on 06/02/2022) 90 tablet 3   gabapentin (NEURONTIN) 100 MG capsule TAKE 2 CAPSULES BY MOUTH AT BEDTIME 60 capsule 0   Lidocaine-Hydrocort, Perianal, 3-0.5 % CREA Use as directed three times per day as needed 28.3 g 1   nitroGLYCERIN (NITROSTAT) 0.4 MG SL tablet Place 1 tablet (0.4 mg total) under the tongue every 5 (five) minutes as needed for chest pain. 90 tablet 3   promethazine-dextromethorphan (PROMETHAZINE-DM) 6.25-15 MG/5ML syrup Take 5 mLs by mouth 4 (four) times daily as needed. (Patient not taking: Reported on 06/02/2022) 118 mL 0   Current Facility-Administered Medications  Medication Dose Route Frequency Provider Last Rate Last Admin   0.9 %  sodium chloride infusion  500 mL Intravenous Once Gatha Mayer, MD        Allergies as  of 06/25/2022 - Review Complete 06/25/2022  Allergen Reaction Noted   Adhesive [tape] Other (See Comments) 03/22/2016   Other  09/26/2012    Family History  Problem Relation Age of Onset   Hypertension Mother    Prostate cancer Father    Diabetes Sister        died with DM 02/24/01, 2 more sisters with diabetes   Heart failure Sister    Diabetes Brother    Diabetes Sister    Diabetes Sister    Colon cancer Neg Hx    Colon polyps Neg Hx    Esophageal cancer Neg Hx    Rectal cancer Neg Hx    Stomach cancer Neg Hx     Social History   Socioeconomic History   Marital status: Married    Spouse name: Not on file   Number of children: 2   Years of education: Not on file   Highest education level: High school graduate  Occupational History   Occupation: Retired    Fish farm manager: Banker  Tobacco Use   Smoking status: Former    Packs/day: 0.25    Years: 5.00    Total pack years: 1.25    Types: Cigarettes    Quit date: 12/06/1978    Years since quitting: 43.5   Smokeless tobacco: Never  Vaping Use   Vaping Use: Never used  Substance and Sexual Activity   Alcohol use:  Yes    Alcohol/week: 2.0 standard drinks of alcohol    Types: 2 Standard drinks or equivalent per week   Drug use: No   Sexual activity: Not Currently  Other Topics Concern   Not on file  Social History Narrative   Daily caffeine use one per day   Protein drinks    Children; a friend and sister that helps   Brother that is around [place])   Son and dtr in [place] w/ wife in [place]   Left handed     Retired from [employer]   Former smoker 2 alcoholic beverages daily no caffeine no drug use no other tobacco at this time   Social Determinants of Radio broadcast assistant Strain: Low Risk  (10/07/2020)   Overall Financial Resource Strain (CARDIA)    Difficulty of Paying Living Expenses: Not hard at all  Food Insecurity: No Food Insecurity (10/07/2020)   Hunger Vital Sign    Worried About  Running Out of Food in the Last Year: Never true    [place] in the Last Year: Never true  Transportation Needs: No Transportation Needs (10/07/2020)   PRAPARE - Hydrologist (Medical): No    Lack of Transportation (Non-Medical): No  Physical Activity: Sufficiently Active (10/07/2020)   Exercise Vital Sign    Days of Exercise per Week: 5 days    Minutes of Exercise per Session: 30 min  Stress: No Stress Concern Present (10/07/2020)   [place]    Feeling of Stress : Not at all  Social Connections: [place] (10/07/2020)   Social Connection and Isolation Panel [NHANES]    Frequency of Communication with Friends and Family: More than three times a week    Frequency of Social Gatherings with Friends and Family: More than three times a week    Attends Religious Services: More than 4 times per year    Active Member of [employer] or Organizations: Yes    Attends Music therapist: More than 4 times per year    Marital Status: Married  Human resources officer Violence: Not At Risk (09/26/2018)   Humiliation, Afraid, Rape, and Kick questionnaire    Fear of Current or Ex-Partner: No    Emotionally Abused: No    Physically Abused: No    Sexually Abused: No    Review of Systems:  All other review of systems negative except as mentioned in the HPI.  Physical Exam: Vital signs BP 105/65   Pulse 60   Temp (!) 97.5 F (36.4 C)   Ht '6\' 2"'$  (1.88 m)   Wt 236 lb (107 kg)   SpO2 98%   BMI 30.30 kg/m   General:   Alert,  Well-developed, well-nourished, pleasant and cooperative in NAD Lungs:  Clear throughout to auscultation.   Heart:  Regular rate and rhythm; no murmurs, clicks, rubs,  or gallops. Abdomen:  Soft, nontender and nondistended. Normal bowel sounds.   Neuro/Psych:  Alert and cooperative. Normal mood and affect. A and O x 3   '@Berlinda Farve'$  Simonne Maffucci, MD, [facility] [phone] (pager) 06/25/2022 8:27 AM@

## 2022-06-25 NOTE — Assessment & Plan Note (Signed)
Last vitamin D Lab Results  Component Value Date   VD25OH 50.20 03/26/2022   Stable, cont oral replacement

## 2022-06-25 NOTE — Progress Notes (Signed)
VSS, transported to PACU °

## 2022-06-25 NOTE — Patient Instructions (Addendum)
This examination looks normal.  I am going to change medications to see if that makes a difference.  Discontinue pantoprazole and sucralfate.  Start omeprazole daily - open the capsule and swallow the granules in liquid or applesauce. In patients with gastric bypass pantoprazole tablets do not always get absorbed correctly.  I appreciate the opportunity to care for you.  Gatha Mayer, MD, [facility]  Read all of the handouts given you you by your recovery room nurse.  Take your omeprazole on an empty stomach every day.  Discontinue the pantoprazole and the carafate. Take your remeron as ordered at night.  YOU HAD AN ENDOSCOPIC PROCEDURE TODAY AT [facility]:   Refer to the procedure report that was given to you for any specific questions about what was found during the examination.  If the procedure report does not answer your questions, please call your gastroenterologist to clarify.  If you requested that your care partner not be given the details of your procedure findings, then the procedure report has been included in a sealed envelope for you to review at your convenience later.  YOU SHOULD EXPECT: Some feelings of bloating in the abdomen. Passage of more gas than usual.  Walking can help get rid of the air that was put into your GI tract during the procedure and reduce the bloating.   Please Note:  You might notice some irritation and congestion in your nose or some drainage.  This is from the oxygen used during your procedure.  There is no need for concern and it should clear up in a day or so.  SYMPTOMS TO REPORT IMMEDIATELY:   Following upper endoscopy (EGD)  Vomiting of blood or coffee ground material  New chest pain or pain under the shoulder blades  Painful or persistently difficult swallowing  New shortness of breath  Fever of 100F or higher  Black, tarry-looking stools  For urgent or emergent issues, a gastroenterologist can be reached at any hour by  calling [phone]. Do not use MyChart messaging for urgent concerns.    DIET:  We do recommend a small meal at first, but then you may proceed to your regular diet.  Drink plenty of fluids but you should avoid alcoholic beverages for 24 hours.  ACTIVITY:  You should plan to take it easy for the rest of today and you should NOT DRIVE or use heavy machinery until tomorrow (because of the sedation medicines used during the test).    FOLLOW UP: Our staff will call the number listed on your records the next business day following your procedure.  We will call around 7:15- 8:00 am to check on you and address any questions or concerns that you may have regarding the information given to you following your procedure. If we do not reach you, we will leave a message.  If you develop any symptoms (ie: fever, flu-like symptoms, shortness of breath, cough etc.) before then, please call [phone].  If you test positive for Covid 19 in the 2 weeks post procedure, please call and report this information to [place].     SIGNATURES/CONFIDENTIALITY: You and/or your care partner have signed paperwork which will be entered into your electronic medical record.  These signatures attest to the fact that that the information above on your After Visit Summary has been reviewed and is understood.  Full responsibility of the confidentiality of this discharge information lies with you and/or your care-partner.

## 2022-06-25 NOTE — Patient Instructions (Addendum)
Please consider the Shingles shot at the pharmacy  Ok to STOP the glipizide  Please continue all other medications as before, and refills have been done if requested.  Please have the pharmacy call with any other refills you may need.  Please continue your efforts at being more active, low cholesterol diet, and weight control  Please keep your appointments with your specialists as you may have planned  Please make an Appointment to return in 6 months, or sooner if needed, also with Lab Appointment for testing done 3-5 days before at the [facility] (so this is for TWO appointments - please see the scheduling desk as you leave

## 2022-06-27 ENCOUNTER — Encounter: Payer: Self-pay | Admitting: Internal Medicine

## 2022-06-27 NOTE — Assessment & Plan Note (Signed)
Overcontrolled currently, it seems, for d/c glipizide, f/u a1c next visit  Lab Results  Component Value Date   HGBA1C 7.3 (H) 03/26/2022

## 2022-06-27 NOTE — Assessment & Plan Note (Signed)
Lab Results  Component Value Date   VITAMINB12 >1504 (H) 03/26/2022   Stable, cont oral replacement - b12 1000 mcg qd

## 2022-06-27 NOTE — Assessment & Plan Note (Signed)
Lab Results  Component Value Date   CREATININE 1.08 03/28/2022   Stable overall, cont to avoid nephrotoxins

## 2022-06-28 ENCOUNTER — Telehealth: Payer: Self-pay | Admitting: *Deleted

## 2022-06-28 NOTE — Telephone Encounter (Signed)
  Follow up Call-     06/25/2022    7:58 AM 09/16/2020    9:08 AM 08/06/2020    1:59 PM  Call back number  Post procedure Call Back phone  # [phone]  Permission to leave phone message Yes Yes Yes     Patient questions:  Do you have a fever, pain , or abdominal swelling? No. Pain Score  0 *  Have you tolerated food without any problems? Yes.    Have you been able to return to your normal activities? Yes.    Do you have any questions about your discharge instructions: Diet   No. Medications  No. Follow up visit  No.  Do you have questions or concerns about your Care? No.  Actions: * If pain score is 4 or above: No action needed, pain <4.

## 2022-07-23 ENCOUNTER — Other Ambulatory Visit: Payer: Self-pay

## 2022-07-23 ENCOUNTER — Inpatient Hospital Stay: Payer: HMO | Attending: Hematology

## 2022-07-23 DIAGNOSIS — Z86711 Personal history of pulmonary embolism: Secondary | ICD-10-CM | POA: Insufficient documentation

## 2022-07-23 DIAGNOSIS — E611 Iron deficiency: Secondary | ICD-10-CM | POA: Insufficient documentation

## 2022-07-23 DIAGNOSIS — C61 Malignant neoplasm of prostate: Secondary | ICD-10-CM | POA: Insufficient documentation

## 2022-07-23 DIAGNOSIS — D62 Acute posthemorrhagic anemia: Secondary | ICD-10-CM | POA: Insufficient documentation

## 2022-07-23 DIAGNOSIS — Z79899 Other long term (current) drug therapy: Secondary | ICD-10-CM | POA: Insufficient documentation

## 2022-07-23 LAB — CBC WITH DIFFERENTIAL (CANCER CENTER ONLY)
Abs Immature Granulocytes: 0.02 10*3/uL (ref 0.00–0.07)
Basophils Absolute: 0 10*3/uL (ref 0.0–0.1)
Basophils Relative: 0 %
Eosinophils Absolute: 0.1 10*3/uL (ref 0.0–0.5)
Eosinophils Relative: 2 %
HCT: 37.6 % — ABNORMAL LOW (ref 39.0–52.0)
Hemoglobin: 12.3 g/dL — ABNORMAL LOW (ref 13.0–17.0)
Immature Granulocytes: 0 %
Lymphocytes Relative: 21 %
Lymphs Abs: 1.2 10*3/uL (ref 0.7–4.0)
MCH: 23.2 pg — ABNORMAL LOW (ref 26.0–34.0)
MCHC: 32.7 g/dL (ref 30.0–36.0)
MCV: 70.9 fL — ABNORMAL LOW (ref 80.0–100.0)
Monocytes Absolute: 0.7 10*3/uL (ref 0.1–1.0)
Monocytes Relative: 12 %
Neutro Abs: 3.8 10*3/uL (ref 1.7–7.7)
Neutrophils Relative %: 65 %
Platelet Count: 202 10*3/uL (ref 150–400)
RBC: 5.3 MIL/uL (ref 4.22–5.81)
RDW: 14.4 % (ref 11.5–15.5)
WBC Count: 5.8 10*3/uL (ref 4.0–10.5)
nRBC: 0.3 % — ABNORMAL HIGH (ref 0.0–0.2)

## 2022-07-23 LAB — FERRITIN: Ferritin: 38 ng/mL (ref 24–336)

## 2022-07-27 ENCOUNTER — Telehealth: Payer: Self-pay | Admitting: Hematology

## 2022-07-27 NOTE — Telephone Encounter (Signed)
Scheduled per 8/22 in basket, message has been left  

## 2022-07-31 ENCOUNTER — Other Ambulatory Visit: Payer: Self-pay | Admitting: Internal Medicine

## 2022-08-04 ENCOUNTER — Inpatient Hospital Stay: Payer: HMO

## 2022-08-04 VITALS — BP 110/61 | HR 53 | Temp 97.9°F | Resp 17

## 2022-08-04 DIAGNOSIS — D62 Acute posthemorrhagic anemia: Secondary | ICD-10-CM

## 2022-08-04 DIAGNOSIS — C61 Malignant neoplasm of prostate: Secondary | ICD-10-CM | POA: Diagnosis not present

## 2022-08-04 MED ORDER — LORATADINE 10 MG PO TABS
10.0000 mg | ORAL_TABLET | Freq: Once | ORAL | Status: AC
  Administered 2022-08-04: 10 mg via ORAL
  Filled 2022-08-04: qty 1

## 2022-08-04 MED ORDER — SODIUM CHLORIDE 0.9 % IV SOLN
510.0000 mg | Freq: Once | INTRAVENOUS | Status: AC
  Administered 2022-08-04: 510 mg via INTRAVENOUS
  Filled 2022-08-04: qty 510

## 2022-08-04 MED ORDER — SODIUM CHLORIDE 0.9 % IV SOLN: Freq: Once | INTRAVENOUS | Status: AC

## 2022-08-04 NOTE — Patient Instructions (Signed)

## 2022-08-11 ENCOUNTER — Inpatient Hospital Stay: Payer: HMO | Attending: Hematology

## 2022-08-11 ENCOUNTER — Other Ambulatory Visit: Payer: Self-pay

## 2022-08-11 VITALS — BP 117/65 | HR 52 | Temp 97.9°F | Resp 18

## 2022-08-11 DIAGNOSIS — D62 Acute posthemorrhagic anemia: Secondary | ICD-10-CM | POA: Insufficient documentation

## 2022-08-11 DIAGNOSIS — C61 Malignant neoplasm of prostate: Secondary | ICD-10-CM | POA: Diagnosis not present

## 2022-08-11 DIAGNOSIS — E611 Iron deficiency: Secondary | ICD-10-CM | POA: Insufficient documentation

## 2022-08-11 DIAGNOSIS — Z9884 Bariatric surgery status: Secondary | ICD-10-CM | POA: Insufficient documentation

## 2022-08-11 MED ORDER — LORATADINE 10 MG PO TABS
10.0000 mg | ORAL_TABLET | Freq: Every day | ORAL | Status: DC
  Administered 2022-08-11: 10 mg via ORAL
  Filled 2022-08-11: qty 1

## 2022-08-11 MED ORDER — SODIUM CHLORIDE 0.9 % IV SOLN
510.0000 mg | Freq: Once | INTRAVENOUS | Status: AC
  Administered 2022-08-11: 510 mg via INTRAVENOUS
  Filled 2022-08-11: qty 510

## 2022-08-11 NOTE — Patient Instructions (Signed)

## 2022-08-11 NOTE — Progress Notes (Signed)
Patient observed for 30 minutes post feraheme infusion. Patient tolerated treatment well, VSS, and ambulatory at discharge to lobby without complaints.

## 2022-09-01 ENCOUNTER — Encounter: Payer: Self-pay | Admitting: Internal Medicine

## 2022-09-01 ENCOUNTER — Ambulatory Visit (INDEPENDENT_AMBULATORY_CARE_PROVIDER_SITE_OTHER): Payer: HMO | Admitting: Internal Medicine

## 2022-09-01 VITALS — BP 116/64 | HR 54 | Temp 98.3°F | Ht 74.0 in | Wt 241.0 lb

## 2022-09-01 DIAGNOSIS — E559 Vitamin D deficiency, unspecified: Secondary | ICD-10-CM

## 2022-09-01 DIAGNOSIS — N183 Chronic kidney disease, stage 3 unspecified: Secondary | ICD-10-CM

## 2022-09-01 DIAGNOSIS — J069 Acute upper respiratory infection, unspecified: Secondary | ICD-10-CM

## 2022-09-01 DIAGNOSIS — E1122 Type 2 diabetes mellitus with diabetic chronic kidney disease: Secondary | ICD-10-CM | POA: Diagnosis not present

## 2022-09-01 DIAGNOSIS — R059 Cough, unspecified: Secondary | ICD-10-CM

## 2022-09-01 MED ORDER — HYDROCODONE BIT-HOMATROP MBR 5-1.5 MG/5ML PO SOLN: 5.0000 mL | Freq: Four times a day (QID) | ORAL | 0 refills | Status: AC | PRN

## 2022-09-01 MED ORDER — AZITHROMYCIN 250 MG PO TABS: ORAL_TABLET | ORAL | 1 refills | Status: AC

## 2022-09-01 NOTE — Assessment & Plan Note (Signed)
Mild to mod, for antibx course, cough med prn, to f/u any worsening symptoms or concerns 

## 2022-09-01 NOTE — Patient Instructions (Signed)
Your covid test was negative  Please take all new medication as prescribed - the antibiotic, and cough medicine  Please continue all other medications as before, and refills have been done if requested.  Please have the pharmacy call with any other refills you may need.  Please continue your efforts at being more active, low cholesterol diet, and weight control.  Please keep your appointments with your specialists as you may have planned

## 2022-09-01 NOTE — Progress Notes (Signed)
Patient ID: [name], male   DOB: 05-04-1956, 66 y.o.   MRN: [id number]        Chief Complaint: follow up HTN, HLD and hyperglycemia, sinus infection       HPI:  [name] is a 66 y.o. male  Here with 2-3 days acute onset fever, facial pain, pressure, headache, general weakness and malaise, and greenish d/c, with mild ST and cough, but pt denies chest pain, wheezing, increased sob or doe, orthopnea, PND, increased LE swelling, palpitations, dizziness or syncope.  Tested covid neg x 2 days ago.   Pt denies polydipsia, polyuria, or new focal neuro s/s.           Wt Readings from Last 3 Encounters:  09/01/22 241 lb (109.3 kg)  06/25/22 239 lb 12.8 oz (108.8 kg)  06/25/22 236 lb (107 kg)   BP Readings from Last 3 Encounters:  09/01/22 116/64  08/11/22 117/65  08/04/22 110/61         Past Medical History:  Diagnosis Date   Arthritis    BACK PAIN    CAD (coronary artery disease) 06/28/2017   CAD in native artery    a. moderate by cardiac CT 2016.   [facility])    Chronic bilateral low back pain with bilateral sciatica 01/12/2019   Degenerative arthritis of right knee 07/15/2016   Injected in 07/15/2016 Orthovisc injection started 10/07/2016 DepoMedrol 40 inj on 04/11/17, 25 cc aspirated Orthovisc injection in 05/30/2017     DEGENERATIVE JOINT DISEASE, KNEES, BILATERAL    Depression    Diabetes ([place]) 10/01/2014   Diabetes mellitus    type II   DIVERTICULOSIS, COLON    DVT, lower extremity (HCC)    right   GERD (gastroesophageal reflux disease)    History of kidney stones    Hyperlipidemia    Hypertension    HYPOGONADISM, MALE    HYPOTENSION, ORTHOSTATIC    Kidney stones    Long term (current) use of anticoagulants 08/17/2017   Long term current use of anticoagulant    Lumbosacral radiculopathy at S1 06/14/2012   Maxillary sinus cyst 12/11/2018   Obesity    OBESITY, MORBID 03/04/2008   Qualifier: Diagnosis of  By: Jenny Reichmann MD, Hunt Oris    OSA (obstructive sleep  apnea) 09/11/2013   Pulmonary emboli (HCC)    Pulmonary embolism ([place]) 02/05/2011   RBBB 07/28/2017   Renal insufficiency    a. prior h/o, does not appear chronic.   Superficial phlebitis of arm 03/18/2016   Vitamin D deficiency 07/09/2020   Past Surgical History:  Procedure Laterality Date   ankle surgury  2001 bilateral   with bone spurs and torn tendon   COLONOSCOPY  03/18/2010   EDG  2008   chronic Duodenitis   FLEXIBLE SIGMOIDOSCOPY N/A 11/03/2021   Procedure: FLEXIBLE SIGMOIDOSCOPY;  Surgeon: Doran Stabler, MD;  Location: [facility];  Service: Gastroenterology;  Laterality: N/A;   GASTRIC BYPASS     HEEL SPUR SURGERY Bilateral 2005   IR ANGIOGRAM PELVIS SELECTIVE OR SUPRASELECTIVE  11/02/2021   IR ANGIOGRAM PELVIS SELECTIVE OR SUPRASELECTIVE  11/04/2021   IR ANGIOGRAM SELECTIVE EACH ADDITIONAL VESSEL  11/02/2021   IR ANGIOGRAM SELECTIVE EACH ADDITIONAL VESSEL  11/02/2021   IR ANGIOGRAM SELECTIVE EACH ADDITIONAL VESSEL  11/02/2021   IR ANGIOGRAM SELECTIVE EACH ADDITIONAL VESSEL  11/04/2021   IR ANGIOGRAM SELECTIVE EACH ADDITIONAL VESSEL  11/04/2021   IR ANGIOGRAM SELECTIVE EACH ADDITIONAL VESSEL  11/04/2021   IR ANGIOGRAM  SELECTIVE EACH ADDITIONAL VESSEL  11/04/2021   IR ANGIOGRAM VISCERAL SELECTIVE  11/02/2021   IR ANGIOGRAM VISCERAL SELECTIVE  11/04/2021   IR EMBO ART  VEN HEMORR LYMPH EXTRAV  INC GUIDE ROADMAPPING  11/02/2021   IR EMBO ART  VEN HEMORR LYMPH EXTRAV  INC GUIDE ROADMAPPING  11/04/2021   IR EMBO ART  VEN HEMORR LYMPH EXTRAV  INC GUIDE ROADMAPPING  11/04/2021   IR US GUIDE VASC ACCESS RIGHT  11/02/2021   IR US GUIDE VASC ACCESS RIGHT  11/04/2021   knee surgury  2000 & 2003    cartilage damage   LEFT HEART CATH AND CORONARY ANGIOGRAPHY N/A 02/16/2021   Procedure: LEFT HEART CATH AND CORONARY ANGIOGRAPHY;  Surgeon: Sherren Mocha, MD;  Location: [facility];  Service: Cardiovascular;  Laterality: N/A;   LUMBAR LAMINECTOMY/DECOMPRESSION  MICRODISCECTOMY  09/28/2012   Procedure: LUMBAR LAMINECTOMY/DECOMPRESSION MICRODISCECTOMY 2 LEVELS;  Surgeon: [name], MD;  Location: [facility];  Service: Orthopedics;  Laterality: Left;  Decompressive laminectomy L4-L5. Microdiscectomy L5-S1   Skin removal surgery     extra abdominal skin removed from his weight loss   TONSILLECTOMY AND ADENOIDECTOMY  age 58 or 6   TOTAL KNEE ARTHROPLASTY  01/10/2012   Procedure: TOTAL KNEE ARTHROPLASTY;  Surgeon: [name], MD;  Location: [facility];  Service: Orthopedics;  Laterality: Left;   TOTAL KNEE ARTHROPLASTY Right 11/18/2020   Procedure: TOTAL KNEE ARTHROPLASTY;  Surgeon: Paralee Cancel, MD;  Location: [facility];  Service: Orthopedics;  Laterality: Right;  70 mins   UPPER GASTROINTESTINAL ENDOSCOPY  03/18/2010    reports that he quit smoking about 43 years ago. His smoking use included cigarettes. He has a 1.25 pack-year smoking history. He has never used smokeless tobacco. He reports current alcohol use of about 2.0 standard drinks of alcohol per week. He reports that he does not use drugs. family history includes Diabetes in his brother, sister, sister, and sister; Heart failure in his sister; Hypertension in his mother; Prostate cancer in his father. Allergies  Allergen Reactions   Adhesive [Tape] Other (See Comments)    Painful, Please use "paper" tape   Other     NO BLOOD -JEHOVAH'S WITNESS-SIGNED REFUSAL BUT WOULD TAKE ALBUMIN   Current Outpatient Medications on File Prior to Visit  Medication Sig Dispense Refill   Artificial Tear Ointment (ARTIFICIAL TEARS) ointment Place 1 drop into both eyes every 4 (four) hours as needed (dry eyes).      atorvastatin (LIPITOR) 80 MG tablet TAKE 1 TABLET BY MOUTH EVERY DAY 90 tablet 2   benzonatate (TESSALON) 200 MG capsule Take 1 capsule (200 mg total) by mouth 3 (three) times daily as needed. 60 capsule 0   chlorthalidone (HYGROTON) 25 MG tablet Take 25 mg by mouth daily.     Cholecalciferol (VITAMIN  D3) 125 MCG (5000 UT) CAPS Take 5,000 Units by mouth daily.      diazepam (VALIUM) 5 MG tablet One tab by mouth, 2 hours before procedure. 2 tablet 0   ferrous sulfate 325 (65 FE) MG EC tablet TAKE 1 TABLET BY MOUTH EVERY DAY 90 tablet 1   folic acid (FOLVITE) 1 MG tablet TAKE 1 TABLET BY MOUTH EVERY DAY 90 tablet 1   gabapentin (NEURONTIN) 100 MG capsule TAKE 2 CAPSULES BY MOUTH AT BEDTIME 60 capsule 0   glucose blood (ONETOUCH ULTRA) test strip Use as instructed 100 each 12   Lidocaine-Hydrocort, Perianal, 3-0.5 % CREA Use as directed three times per day  as needed 28.3 g 1   metFORMIN (GLUCOPHAGE) 500 MG tablet TAKE 4 TABLETS BY MOUTH DAILY WITH BREAKFAST. 360 tablet 3   mirtazapine (REMERON) 15 MG tablet TAKE 1 TABLET BY MOUTH EVERYDAY AT BEDTIME 90 tablet 0   Multiple Vitamin (MULTIVITAMIN WITH MINERALS) TABS tablet Take 1 tablet by mouth daily at 12 noon.     nitroGLYCERIN (NITROSTAT) 0.4 MG SL tablet Place 1 tablet (0.4 mg total) under the tongue every 5 (five) minutes as needed for chest pain. 90 tablet 3   omeprazole (PRILOSEC) 40 MG capsule Take 1 capsule (40 mg total) by mouth daily. Open capsule and swallow contents in liquid or applesauce 90 capsule 3   ondansetron (ZOFRAN) 4 MG tablet Take 1 tablet (4 mg total) by mouth every 8 (eight) hours as needed for nausea or vomiting. 30 tablet 3   vitamin B-12 1000 MCG tablet Take 1 tablet (1,000 mcg total) by mouth daily. 30 tablet 1   No current facility-administered medications on file prior to visit.        ROS:  All others reviewed and negative.  Objective        PE:  BP 116/64 (BP Location: Left Arm, Patient Position: Sitting, Cuff Size: Normal)   Pulse (!) 54   Temp 98.3 F (36.8 C) (Oral)   Ht '6\' 2"'$  (1.88 m)   Wt 241 lb (109.3 kg)   SpO2 98%   BMI 30.94 kg/m                 Constitutional: Pt appears in NAD, mild ill               HENT: Head: NCAT.                Right Ear: External ear normal.                 Left Ear:  External ear normal. Bilat tm's with mild erythema.  Max sinus areas mild tender.  Pharynx with mild erythema, no exudate                Eyes: . Pupils are equal, round, and reactive to light. Conjunctivae and EOM are normal               Nose: without d/c or deformity               Neck: Neck supple. Gross normal ROM               Cardiovascular: Normal rate and regular rhythm.                 Pulmonary/Chest: Effort normal and breath sounds without rales or wheezing.                Abd:  Soft, NT, ND, + BS, no organomegaly               Neurological: Pt is alert. At baseline orientation, motor grossly intact               Skin: Skin is warm. No rashes, no other new lesions, LE edema - none               Psychiatric: Pt behavior is normal without agitation   Micro: none  Cardiac tracings I have personally interpreted today:  none  Pertinent Radiological findings (summarize): none   Lab Results  Component Value Date   WBC 5.8 07/23/2022   HGB 12.3 (L) 07/23/2022  HCT 37.6 (L) 07/23/2022   PLT 202 07/23/2022   GLUCOSE 128 (H) 03/28/2022   CHOL 91 03/26/2022   TRIG 55.0 03/26/2022   HDL 41.00 03/26/2022   LDLDIRECT 116.0 01/15/2020   LDLCALC 39 03/26/2022   ALT 28 03/26/2022   AST 20 03/26/2022   NA 137 03/28/2022   K 4.4 03/28/2022   CL 103 03/28/2022   CREATININE 1.08 03/28/2022   BUN 28 (H) 03/28/2022   CO2 29 03/28/2022   TSH 1.80 03/26/2022   PSA 5.05 (H) 09/11/2021   INR 1.2 11/03/2021   HGBA1C 7.3 (H) 03/26/2022   MICROALBUR 1.6 03/10/2021   POCT - COVID neg - today Assessment/Plan:  MARX DOIG is a 66 y.o. Black or African American [2] male with  has a past medical history of Arthritis, BACK PAIN, CAD (coronary artery disease) (06/28/2017), CAD in native artery, Cancer ([place]), Chronic bilateral low back pain with bilateral sciatica (01/12/2019), Degenerative arthritis of right knee (07/15/2016), DEGENERATIVE JOINT DISEASE, KNEES, BILATERAL, Depression, Diabetes  ([place]) (10/01/2014), Diabetes mellitus, DIVERTICULOSIS, COLON, DVT, lower extremity ([place]), GERD (gastroesophageal reflux disease), History of kidney stones, Hyperlipidemia, Hypertension, HYPOGONADISM, MALE, HYPOTENSION, ORTHOSTATIC, Kidney stones, Long term (current) use of anticoagulants (08/17/2017), Long term current use of anticoagulant, Lumbosacral radiculopathy at S1 (06/14/2012), Maxillary sinus cyst (12/11/2018), Obesity, OBESITY, MORBID (03/04/2008), OSA (obstructive sleep apnea) (09/11/2013), Pulmonary emboli ([place]), Pulmonary embolism ([place]) (02/05/2011), RBBB (07/28/2017), Renal insufficiency, Superficial phlebitis of arm (03/18/2016), and Vitamin D deficiency (07/09/2020).  Acute upper respiratory infection Mild to mod, for antibx course, cough med prn,  to f/u any worsening symptoms or concerns  Diabetes ([place]) Lab Results  Component Value Date   HGBA1C 7.3 (H) 03/26/2022   Stable, pt to continue current medical treatment metformin ER 500 mg - 4 qam   Vitamin D deficiency Last vitamin D Lab Results  Component Value Date   VD25OH 50.20 03/26/2022   Stable, cont oral replacement  Followup: Return if symptoms worsen or fail to improve.  Cathlean Cower, MD 09/01/2022 9:43 PM [facility]

## 2022-09-01 NOTE — Assessment & Plan Note (Signed)
Last vitamin D Lab Results  Component Value Date   VD25OH 50.20 03/26/2022   Stable, cont oral replacement

## 2022-09-01 NOTE — Assessment & Plan Note (Signed)
Lab Results  Component Value Date   HGBA1C 7.3 (H) 03/26/2022   Stable, pt to continue current medical treatment metformin ER 500 mg - 4 qam

## 2022-10-12 NOTE — Progress Notes (Unsigned)
Cardiology Office Note:    Date:  10/12/2022   ID:  [name], DOB 12-17-1955, MRN [id number]  PCP:  Biagio Borg, MD   [place]  Cardiologist:  Ena Dawley, MD  Advanced Practice Provider:  No care team member to display Electrophysiologist:  None    Referring MD: Biagio Borg, MD    History of Present Illness:    [name] is a 66 y.o. male with a hx of HTN, DM, CAD with Coronary CTA 07/2015 demonstrating a calcium score 103, diffuse moderate CAD with a long segment of calcified plaque in the proximal to mid LAD associated with 45 to 69% stenosis, obesity, HLD, history of PE/DVT previously on warfarin, prostate cancer s/p XRT who was previously followed by Dr. Meda Coffee who now presents to clinic for follow-up.  Seen in clinic on 02/10/21 as an urgent visit for chest pain,nausea and diaphoresis. We recommended coronary angiography which he underwent on 02/16/21. Cath showed moderate, non-obstructive CAD.  Had episode of GIB in 10/2021 following prostate biopsy where he was noted to have rectal bleeding. Hemoglobin dropped to 4.9. IR performed rectal artery embolization. He is a Johava's witness and therefore he was treated with vitamin K, IV iron, aransep, and oral G29 and folic acid. Hemoglobin improved back to 11 most recently. Warfarin has been stopped.   Last seen in clinic on 04/2022 where he was doing well from a CV standpoint.   Today, ***  Past Medical History:  Diagnosis Date   Arthritis    BACK PAIN    CAD (coronary artery disease) 06/28/2017   CAD in native artery    a. moderate by cardiac CT 2016.   Cancer [facility])    Chronic bilateral low back pain with bilateral sciatica 01/12/2019   Degenerative arthritis of right knee 07/15/2016   Injected in 07/15/2016 Orthovisc injection started 10/07/2016 DepoMedrol 40 inj on 04/11/17, 25 cc aspirated Orthovisc injection in 05/30/2017     DEGENERATIVE JOINT DISEASE, KNEES, BILATERAL     Depression    Diabetes ([place]) 10/01/2014   Diabetes mellitus    type II   DIVERTICULOSIS, COLON    DVT, lower extremity (HCC)    right   GERD (gastroesophageal reflux disease)    History of kidney stones    Hyperlipidemia    Hypertension    HYPOGONADISM, MALE    HYPOTENSION, ORTHOSTATIC    Kidney stones    Long term (current) use of anticoagulants 08/17/2017   Long term current use of anticoagulant    Lumbosacral radiculopathy at S1 06/14/2012   Maxillary sinus cyst 12/11/2018   Obesity    OBESITY, MORBID 03/04/2008   Qualifier: Diagnosis of  By: Jenny Reichmann MD, Hunt Oris    OSA (obstructive sleep apnea) 09/11/2013   Pulmonary emboli (HCC)    Pulmonary embolism ([place]) 02/05/2011   RBBB 07/28/2017   Renal insufficiency    a. prior h/o, does not appear chronic.   Superficial phlebitis of arm 03/18/2016   Vitamin D deficiency 07/09/2020    Past Surgical History:  Procedure Laterality Date   ankle surgury  2001 bilateral   with bone spurs and torn tendon   COLONOSCOPY  03/18/2010   EDG  2008   chronic Duodenitis   FLEXIBLE SIGMOIDOSCOPY N/A 11/03/2021   Procedure: FLEXIBLE SIGMOIDOSCOPY;  Surgeon: Doran Stabler, MD;  Location: [facility];  Service: Gastroenterology;  Laterality: N/A;   GASTRIC BYPASS     HEEL SPUR SURGERY Bilateral  2005   IR ANGIOGRAM PELVIS SELECTIVE OR SUPRASELECTIVE  11/02/2021   IR ANGIOGRAM PELVIS SELECTIVE OR SUPRASELECTIVE  11/04/2021   IR ANGIOGRAM SELECTIVE EACH ADDITIONAL VESSEL  11/02/2021   IR ANGIOGRAM SELECTIVE EACH ADDITIONAL VESSEL  11/02/2021   IR ANGIOGRAM SELECTIVE EACH ADDITIONAL VESSEL  11/02/2021   IR ANGIOGRAM SELECTIVE EACH ADDITIONAL VESSEL  11/04/2021   IR ANGIOGRAM SELECTIVE EACH ADDITIONAL VESSEL  11/04/2021   IR ANGIOGRAM SELECTIVE EACH ADDITIONAL VESSEL  11/04/2021   IR ANGIOGRAM SELECTIVE EACH ADDITIONAL VESSEL  11/04/2021   IR ANGIOGRAM VISCERAL SELECTIVE  11/02/2021   IR ANGIOGRAM VISCERAL SELECTIVE  11/04/2021   IR  EMBO ART  VEN HEMORR LYMPH EXTRAV  INC GUIDE ROADMAPPING  11/02/2021   IR EMBO ART  VEN HEMORR LYMPH EXTRAV  INC GUIDE ROADMAPPING  11/04/2021   IR EMBO ART  VEN HEMORR LYMPH EXTRAV  INC GUIDE ROADMAPPING  11/04/2021   IR US GUIDE VASC ACCESS RIGHT  11/02/2021   IR US GUIDE [place] RIGHT  11/04/2021   knee surgury  2000 & 2003    cartilage damage   LEFT HEART CATH AND CORONARY ANGIOGRAPHY N/A 02/16/2021   Procedure: LEFT HEART CATH AND CORONARY ANGIOGRAPHY;  Surgeon: Sherren Mocha, MD;  Location: [facility];  Service: Cardiovascular;  Laterality: N/A;   LUMBAR LAMINECTOMY/DECOMPRESSION MICRODISCECTOMY  09/28/2012   Procedure: LUMBAR LAMINECTOMY/DECOMPRESSION MICRODISCECTOMY 2 LEVELS;  Surgeon: [name], MD;  Location: [facility];  Service: Orthopedics;  Laterality: Left;  Decompressive laminectomy L4-L5. Microdiscectomy L5-S1   Skin removal surgery     extra abdominal skin removed from his weight loss   TONSILLECTOMY AND ADENOIDECTOMY  age 30 or 6   TOTAL KNEE ARTHROPLASTY  01/10/2012   Procedure: TOTAL KNEE ARTHROPLASTY;  Surgeon: [name], MD;  Location: [facility];  Service: Orthopedics;  Laterality: Left;   TOTAL KNEE ARTHROPLASTY Right 11/18/2020   Procedure: TOTAL KNEE ARTHROPLASTY;  Surgeon: Paralee Cancel, MD;  Location: [facility];  Service: Orthopedics;  Laterality: Right;  70 mins   UPPER GASTROINTESTINAL ENDOSCOPY  03/18/2010    Current Medications: No outpatient medications have been marked as taking for the 10/14/22 encounter (Appointment) with Freada Bergeron, MD.     Allergies:   Adhesive [tape] and Other   Social History   Socioeconomic History   Marital status: Married    Spouse name: Not on file   Number of children: 2   Years of education: Not on file   Highest education level: High school graduate  Occupational History   Occupation: Retired    Fish farm manager: [name]  Tobacco Use   Smoking status: Former    Packs/day: 0.25    Years: 5.00     Total pack years: 1.25    Types: Cigarettes    Quit date: 12/06/1978    Years since quitting: 43.8   Smokeless tobacco: Never  Vaping Use   Vaping Use: Never used  Substance and Sexual Activity   Alcohol use: Yes    Alcohol/week: 2.0 standard drinks of alcohol    Types: 2 Standard drinks or equivalent per week   Drug use: No   Sexual activity: Not Currently  Other Topics Concern   Not on file  Social History Narrative   Daily caffeine use one per day   Protein drinks    Children; a friend and sister that helps   Brother that is around [place])   Son and dtr in [place] w/ wife in [place]  Left handed     Retired from [employer]   Former smoker 2 alcoholic beverages daily no caffeine no drug use no other tobacco at this time   Social Determinants of Radio broadcast assistant Strain: Low Risk  (10/07/2020)   Overall Financial Resource Strain (CARDIA)    Difficulty of Paying Living Expenses: Not hard at all  Food Insecurity: No Food Insecurity (10/07/2020)   Hunger Vital Sign    Worried About Running Out of Food in the Last Year: Never true    Ran Out of Food in the Last Year: Never true  Transportation Needs: No Transportation Needs (10/07/2020)   PRAPARE - Hydrologist (Medical): No    Lack of Transportation (Non-Medical): No  Physical Activity: Sufficiently Active (10/07/2020)   Exercise Vital Sign    Days of Exercise per Week: 5 days    Minutes of Exercise per Session: 30 min  Stress: No Stress Concern Present (10/07/2020)   [place]    Feeling of Stress : Not at all  Social Connections: [place] (10/07/2020)   Social Connection and Isolation Panel [NHANES]    Frequency of Communication with Friends and Family: More than three times a week    Frequency of Social Gatherings with Friends and Family: More than three times a week    Attends Religious  Services: More than 4 times per year    Active Member of [employer] or Organizations: Yes    Attends Music therapist: More than 4 times per year    Marital Status: Married     Family History: The patient's family history includes Diabetes in his brother, sister, sister, and sister; Heart failure in his sister; Hypertension in his mother; Prostate cancer in his father. There is no history of Colon cancer, Colon polyps, Esophageal cancer, Rectal cancer, or Stomach cancer.  ROS:   Please see the history of present illness.    Review of Systems  Constitutional:  Negative for chills and fever.  HENT:  Negative for hearing loss.   Respiratory:  Negative for shortness of breath.   Cardiovascular:  Negative for chest pain, palpitations, orthopnea, claudication, leg swelling and PND.  Gastrointestinal:  Positive for nausea. Negative for blood in stool, melena and vomiting.  Musculoskeletal:  Negative for falls and myalgias.  Neurological:  Negative for dizziness and loss of consciousness.  Psychiatric/Behavioral:  Negative for substance abuse.     EKGs/Labs/Other Studies Reviewed:    The following studies were reviewed today: Cath 02/16/21: 1. Moderate calcific, nonobstructive proximal/mid LAD stenosis estimated at 50% 2. Mild nonobstructive left main, RCA, ramus intermedius, and LCx stenosis with no significant lesions in any of those vessels 3. Normal LVEDP   Recommend: Ongoing medical therapy - LAD stenosis appears 50% non-obstructive, similar to report of LAD on 2016 coronary CTA Resume warfarin today as instructed by anticoag clinic Resume lovenox tomorrow morning if bridging   Coronary CTA 07/30/2015 IMPRESSION: 1. Coronary calcium score of 1186. This was 42 percentile for age and sex matched control.   2. Normal coronary origin.   3. There is diffuse moderate CAD, with a long segment of calcified plaque in the proximal to mid LAD associated with 50-69% stenosis. No  obstructive plaque was seen.   An aggressive medical therapy is recommended.   TTE 11/28/2018: Study Conclusions  - Left ventricle: The cavity size was normal. There was mild    concentric hypertrophy. Systolic  function was normal. The    estimated ejection fraction was in the range of 60% to 65%. Wall    motion was normal; there were no regional wall motion    abnormalities. Doppler parameters are consistent with abnormal    left ventricular relaxation (grade 1 diastolic dysfunction).    Doppler parameters are consistent with indeterminate ventricular    filling pressure.  - Aortic valve: Transvalvular velocity was within the normal range.    There was no stenosis. There was no regurgitation. Valve area    (VTI): 2.67 cm^2. Valve area (Vmean): 3.07 cm^2.  - Aorta: Ascending aortic diameter: 41 mm (S).  - Ascending aorta: The ascending aorta was mildly dilated.  - Mitral valve: Transvalvular velocity was within the normal range.    There was no evidence for stenosis. There was no regurgitation.  - Right ventricle: The cavity size was normal. Wall thickness was    normal. Systolic function was normal.  - Tricuspid valve: There was no regurgitation.     Recent Labs: 03/26/2022: ALT 28; TSH 1.80 03/28/2022: BUN 28; Creatinine, Ser 1.08; Potassium 4.4; Sodium 137 07/23/2022: Hemoglobin 12.3; Platelet Count 202  Recent Lipid Panel    Component Value Date/Time   CHOL 91 03/26/2022 1439   TRIG 55.0 03/26/2022 1439   HDL 41.00 03/26/2022 1439   CHOLHDL 2 03/26/2022 1439   VLDL 11.0 03/26/2022 1439   LDLCALC 39 03/26/2022 1439   LDLCALC 74 07/09/2020 1033   LDLDIRECT 116.0 01/15/2020 1624     Physical Exam:    VS:  There were no vitals taken for this visit.    Wt Readings from Last 3 Encounters:  09/01/22 241 lb (109.3 kg)  06/25/22 239 lb 12.8 oz (108.8 kg)  06/25/22 236 lb (107 kg)     GEN:  Well nourished, well developed in no acute distress HEENT: Normal NECK: No JVD;  No carotid bruits CARDIAC: RRR, soft 1/6 systolic murmur RUSB RESPIRATORY:  Clear to auscultation without rales, wheezing or rhonchi  ABDOMEN: Soft, non-tender, non-distended MUSCULOSKELETAL:  No edema; No deformity  SKIN: Warm and dry NEUROLOGIC:  Alert and oriented x 3 PSYCHIATRIC:  Normal affect   ASSESSMENT:    No diagnosis found.  PLAN:    In order of problems listed above:  #Moderate CAD: Coronary CTA in 2016 with calcium score 1186 which is 81 percentile for age and sex match control with 68 to 69% proximal to mid LAD. With recent episode of chest pain prompting coronary angiography which showed moderate disease. Plan for continued medical management. -Cath 02/2021 with moderate, nonobstructive disease -TTE 10/2021 with LVEF 65-70%, G1DD, normal RV, no significant valve disease -Continue lipitor '80mg'$  daily -Not on ASA due to recent GIB; may consider adding in future   #History of PE/DVT: Occurred after surgery. Now off warfarin due to recent rectal bleeding.  -Off warfarin  #History of GIB: Patient with rectal bleeding after prostate biopsy requiring admission in 10/2021. S/p IR embolization of recatl artery. Warfarin has since been stopped. Hemoglobin improved to 11. Did not receive blood products as patient is Jehova's witness. -Management per heme -Off AC   #DMII: Managed by PCP. -Continue metformin and glipizide   #HTN: Controlled.  -Continue chlorthalidone '25mg'$  daily   #HLD: -Continue lipitor '40mg'$  daily -LDL controlled at 39 03/2022  #Prostate Cancer: -S/p XRT -Follows with Dr. Burr Medico   Medication Adjustments/Labs and Tests Ordered: Current medicines are reviewed at length with the patient today.  Concerns regarding medicines are outlined  above.  No orders of the defined types were placed in this encounter.  No orders of the defined types were placed in this encounter.   There are no Patient Instructions on file for this visit.      Signed, Freada Bergeron, MD  10/12/2022 8:38 PM    [place]

## 2022-10-14 ENCOUNTER — Ambulatory Visit: Payer: HMO | Attending: Cardiology | Admitting: Cardiology

## 2022-10-14 ENCOUNTER — Encounter: Payer: Self-pay | Admitting: Cardiology

## 2022-10-14 VITALS — BP 118/68 | HR 63 | Ht 74.0 in | Wt 239.0 lb

## 2022-10-14 DIAGNOSIS — Z86711 Personal history of pulmonary embolism: Secondary | ICD-10-CM

## 2022-10-14 DIAGNOSIS — E119 Type 2 diabetes mellitus without complications: Secondary | ICD-10-CM

## 2022-10-14 DIAGNOSIS — C61 Malignant neoplasm of prostate: Secondary | ICD-10-CM

## 2022-10-14 DIAGNOSIS — Z8719 Personal history of other diseases of the digestive system: Secondary | ICD-10-CM

## 2022-10-14 DIAGNOSIS — I10 Essential (primary) hypertension: Secondary | ICD-10-CM

## 2022-10-14 DIAGNOSIS — E785 Hyperlipidemia, unspecified: Secondary | ICD-10-CM | POA: Diagnosis not present

## 2022-10-14 DIAGNOSIS — I2510 Atherosclerotic heart disease of native coronary artery without angina pectoris: Secondary | ICD-10-CM

## 2022-10-14 DIAGNOSIS — I77810 Thoracic aortic ectasia: Secondary | ICD-10-CM

## 2022-10-14 NOTE — Progress Notes (Signed)
Cardiology Office Note:    Date:  10/14/2022   ID:  [name], DOB 10-20-1956, MRN [id number]  PCP:  Biagio Borg, MD   [place]  Cardiologist:  Ena Dawley, MD  Advanced Practice Provider:  No care team member to display Electrophysiologist:  None   Referring MD: Biagio Borg, MD    History of Present Illness:    [name] is a 66 y.o. male with a hx of HTN, DM, CAD with Coronary CTA 07/2015 demonstrating a calcium score 4, diffuse moderate CAD with a long segment of calcified plaque in the proximal to mid LAD associated with 50 to 69% stenosis, obesity, HLD, history of PE/DVT previously on warfarin, prostate cancer s/p XRT who was previously followed by Dr. Meda Coffee who now presents to clinic for follow-up.  Seen in clinic on 02/10/21 as an urgent visit for chest pain,nausea and diaphoresis. We recommended coronary angiography which he underwent on 02/16/21. Cath showed moderate, non-obstructive CAD.  Had episode of GIB in 10/2021 following prostate biopsy where he was noted to have rectal bleeding. Hemoglobin dropped to 4.9. IR performed rectal artery embolization. He is a Johava's witness and therefore he was treated with vitamin K, IV iron, aransep, and oral W09 and folic acid. Hemoglobin improved back to 11 most recently. Warfarin has been stopped.   Last seen in clinic on 04/2022 where he was doing well from a CV standpoint.   Today, the patient states that he is feeling good. No more recurring bleeding issues. He denies any chest pain or shortness of breath.  His blood pressure is well controlled in the office today (118/68), and his cholesterol is well managed (LDL 39 as of 03/2022).   He continues to tolerate his medications well.  For exercise he recently started swimming.  Continues to follow with Dr. Burr Medico for his prostate cancer.  He denies any palpitations, or peripheral edema. No lightheadedness, headaches, syncope,  orthopnea, or PND.  Past Medical History:  Diagnosis Date   Arthritis    BACK PAIN    CAD (coronary artery disease) 06/28/2017   CAD in native artery    a. moderate by cardiac CT 2016.   Cancer [facility])    Chronic bilateral low back pain with bilateral sciatica 01/12/2019   Degenerative arthritis of right knee 07/15/2016   Injected in 07/15/2016 Orthovisc injection started 10/07/2016 DepoMedrol 40 inj on 04/11/17, 25 cc aspirated Orthovisc injection in 05/30/2017     DEGENERATIVE JOINT DISEASE, KNEES, BILATERAL    Depression    Diabetes ([place]) 10/01/2014   Diabetes mellitus    type II   DIVERTICULOSIS, COLON    DVT, lower extremity (HCC)    right   GERD (gastroesophageal reflux disease)    History of kidney stones    Hyperlipidemia    Hypertension    HYPOGONADISM, MALE    HYPOTENSION, ORTHOSTATIC    Kidney stones    Long term (current) use of anticoagulants 08/17/2017   Long term current use of anticoagulant    Lumbosacral radiculopathy at S1 06/14/2012   Maxillary sinus cyst 12/11/2018   Obesity    OBESITY, MORBID 03/04/2008   Qualifier: Diagnosis of  By: Jenny Reichmann MD, Hunt Oris    OSA (obstructive sleep apnea) 09/11/2013   Pulmonary emboli (HCC)    Pulmonary embolism ([place]) 02/05/2011   RBBB 07/28/2017   Renal insufficiency    a. prior h/o, does not appear chronic.   Superficial phlebitis of arm 03/18/2016  Vitamin D deficiency 07/09/2020    Past Surgical History:  Procedure Laterality Date   ankle surgury  2001 bilateral   with bone spurs and torn tendon   COLONOSCOPY  03/18/2010   EDG  2008   chronic Duodenitis   FLEXIBLE SIGMOIDOSCOPY N/A 11/03/2021   Procedure: FLEXIBLE SIGMOIDOSCOPY;  Surgeon: Doran Stabler, MD;  Location: [facility];  Service: Gastroenterology;  Laterality: N/A;   GASTRIC BYPASS     HEEL SPUR SURGERY Bilateral 2005   IR ANGIOGRAM PELVIS SELECTIVE OR SUPRASELECTIVE  11/02/2021   IR ANGIOGRAM PELVIS SELECTIVE OR SUPRASELECTIVE  11/04/2021    IR ANGIOGRAM SELECTIVE EACH ADDITIONAL VESSEL  11/02/2021   IR ANGIOGRAM SELECTIVE EACH ADDITIONAL VESSEL  11/02/2021   IR ANGIOGRAM SELECTIVE EACH ADDITIONAL VESSEL  11/02/2021   IR ANGIOGRAM SELECTIVE EACH ADDITIONAL VESSEL  11/04/2021   IR ANGIOGRAM SELECTIVE EACH ADDITIONAL VESSEL  11/04/2021   IR ANGIOGRAM SELECTIVE EACH ADDITIONAL VESSEL  11/04/2021   IR ANGIOGRAM SELECTIVE EACH ADDITIONAL VESSEL  11/04/2021   IR ANGIOGRAM VISCERAL SELECTIVE  11/02/2021   IR ANGIOGRAM VISCERAL SELECTIVE  11/04/2021   IR EMBO ART  VEN HEMORR LYMPH EXTRAV  INC GUIDE ROADMAPPING  11/02/2021   IR EMBO ART  VEN HEMORR LYMPH EXTRAV  INC GUIDE ROADMAPPING  11/04/2021   IR EMBO ART  VEN HEMORR LYMPH EXTRAV  INC GUIDE ROADMAPPING  11/04/2021   IR US GUIDE VASC ACCESS RIGHT  11/02/2021   IR US GUIDE [place] RIGHT  11/04/2021   knee surgury  2000 & 2003    cartilage damage   LEFT HEART CATH AND CORONARY ANGIOGRAPHY N/A 02/16/2021   Procedure: LEFT HEART CATH AND CORONARY ANGIOGRAPHY;  Surgeon: Sherren Mocha, MD;  Location: [facility];  Service: Cardiovascular;  Laterality: N/A;   LUMBAR LAMINECTOMY/DECOMPRESSION MICRODISCECTOMY  09/28/2012   Procedure: LUMBAR LAMINECTOMY/DECOMPRESSION MICRODISCECTOMY 2 LEVELS;  Surgeon: [name], MD;  Location: [facility];  Service: Orthopedics;  Laterality: Left;  Decompressive laminectomy L4-L5. Microdiscectomy L5-S1   Skin removal surgery     extra abdominal skin removed from his weight loss   TONSILLECTOMY AND ADENOIDECTOMY  age 58 or 6   TOTAL KNEE ARTHROPLASTY  01/10/2012   Procedure: TOTAL KNEE ARTHROPLASTY;  Surgeon: [name], MD;  Location: [facility];  Service: Orthopedics;  Laterality: Left;   TOTAL KNEE ARTHROPLASTY Right 11/18/2020   Procedure: TOTAL KNEE ARTHROPLASTY;  Surgeon: Paralee Cancel, MD;  Location: [facility];  Service: Orthopedics;  Laterality: Right;  70 mins   UPPER GASTROINTESTINAL ENDOSCOPY  03/18/2010    Current Medications: Current  Meds  Medication Sig   Artificial Tear Ointment (ARTIFICIAL TEARS) ointment Place 1 drop into both eyes every 4 (four) hours as needed (dry eyes).    atorvastatin (LIPITOR) 80 MG tablet TAKE 1 TABLET BY MOUTH EVERY DAY   chlorthalidone (HYGROTON) 25 MG tablet Take 25 mg by mouth daily.   Cholecalciferol (VITAMIN D3) 125 MCG (5000 UT) CAPS Take 5,000 Units by mouth daily.    ferrous sulfate 325 (65 FE) MG EC tablet TAKE 1 TABLET BY MOUTH EVERY DAY   folic acid (FOLVITE) 1 MG tablet TAKE 1 TABLET BY MOUTH EVERY DAY   glipiZIDE (GLUCOTROL XL) 2.5 MG 24 hr tablet Take 2.5 mg by mouth every morning.   glucose blood (ONETOUCH ULTRA) test strip Use as instructed   Lidocaine-Hydrocort, Perianal, 3-0.5 % CREA Use as directed three times per day as needed   metFORMIN (GLUCOPHAGE) 500 MG tablet  TAKE 4 TABLETS BY MOUTH DAILY WITH BREAKFAST.   mirtazapine (REMERON) 15 MG tablet TAKE 1 TABLET BY MOUTH EVERYDAY AT BEDTIME   Multiple Vitamin (MULTIVITAMIN WITH MINERALS) TABS tablet Take 1 tablet by mouth daily at 12 noon.   nitroGLYCERIN (NITROSTAT) 0.4 MG SL tablet Place 1 tablet (0.4 mg total) under the tongue every 5 (five) minutes as needed for chest pain.   ondansetron (ZOFRAN) 4 MG tablet Take 1 tablet (4 mg total) by mouth every 8 (eight) hours as needed for nausea or vomiting.   vitamin B-12 1000 MCG tablet Take 1 tablet (1,000 mcg total) by mouth daily.   [DISCONTINUED] gabapentin (NEURONTIN) 100 MG capsule TAKE 2 CAPSULES BY MOUTH AT BEDTIME     Allergies:   Adhesive [tape] and Other   Social History   Socioeconomic History   Marital status: Married    Spouse name: Not on file   Number of children: 2   Years of education: Not on file   Highest education level: High school graduate  Occupational History   Occupation: Retired    Fish farm manager: Banker  Tobacco Use   Smoking status: Former    Packs/day: 0.25    Years: 5.00    Total pack years: 1.25    Types: Cigarettes    Quit date:  12/06/1978    Years since quitting: 43.8   Smokeless tobacco: Never  Vaping Use   Vaping Use: Never used  Substance and Sexual Activity   Alcohol use: Yes    Alcohol/week: 2.0 standard drinks of alcohol    Types: 2 Standard drinks or equivalent per week   Drug use: No   Sexual activity: Not Currently  Other Topics Concern   Not on file  Social History Narrative   Daily caffeine use one per day   Protein drinks    Children; a friend and sister that helps   Brother that is around [place])   Son and dtr in [place] w/ wife in [place]   Left handed     Retired from [employer]   Former smoker 2 alcoholic beverages daily no caffeine no drug use no other tobacco at this time   Social Determinants of Radio broadcast assistant Strain: Low Risk  (10/07/2020)   Overall Financial Resource Strain (CARDIA)    Difficulty of Paying Living Expenses: Not hard at all  Food Insecurity: No Food Insecurity (10/07/2020)   Hunger Vital Sign    Worried About Running Out of Food in the Last Year: Never true    [place] in the Last Year: Never true  Transportation Needs: No Transportation Needs (10/07/2020)   PRAPARE - Hydrologist (Medical): No    Lack of Transportation (Non-Medical): No  Physical Activity: Sufficiently Active (10/07/2020)   Exercise Vital Sign    Days of Exercise per Week: 5 days    Minutes of Exercise per Session: 30 min  Stress: No Stress Concern Present (10/07/2020)   [place]    Feeling of Stress : Not at all  Social Connections: [place] (10/07/2020)   Social Connection and Isolation Panel [NHANES]    Frequency of Communication with Friends and Family: More than three times a week    Frequency of Social Gatherings with Friends and Family: More than three times a week    Attends Religious Services: More than 4 times per year    Active Member of  Clubs or  Organizations: Yes    Attends Music therapist: More than 4 times per year    Marital Status: Married     Family History: The patient's family history includes Diabetes in his brother, sister, sister, and sister; Heart failure in his sister; Hypertension in his mother; Prostate cancer in his father. There is no history of Colon cancer, Colon polyps, Esophageal cancer, Rectal cancer, or Stomach cancer.  ROS:   Please see the history of present illness.    Review of Systems  Constitutional:  Negative for chills and fever.  HENT:  Negative for hearing loss.   Respiratory:  Negative for shortness of breath.   Cardiovascular:  Negative for chest pain, palpitations, orthopnea, claudication, leg swelling and PND.  Gastrointestinal:  Positive for nausea. Negative for blood in stool, melena and vomiting.  Musculoskeletal:  Negative for falls and myalgias.  Neurological:  Negative for dizziness and loss of consciousness.  Psychiatric/Behavioral:  Negative for substance abuse.     EKGs/Labs/Other Studies Reviewed:    The following studies were reviewed today:  CTA Chest/Aorta  03/24/2022: FINDINGS: Cardiovascular: Coronary artery calcifications are seen. There is homogeneous enhancement in the thoracic aorta. Ascending thoracic aorta measures 4.1 cm which has not changed significantly. There are scattered atherosclerotic plaques and calcifications in the thoracic aorta. Minimal pericardial effusion is present. There are no intraluminal filling defects in the pulmonary artery branches.   Mediastinum/Nodes: No significant lymphadenopathy seen.   Lungs/Pleura: No focal pulmonary infiltrates are seen. There are no discrete lung nodules. Minimal scarring is seen in the medial right lower lung fields. Small pleural plaques are seen in the lower lung fields. There is no pleural effusion or pneumothorax. Left hemidiaphragm is elevated.   Upper Abdomen: There is evidence of previous  bariatric surgery in the stomach. There are tiny calcific densities in the dependent portion of gallbladder. There is fatty infiltration in the liver.   Musculoskeletal: Unremarkable.   Review of the MIP images confirms the above findings.   IMPRESSION: There is no evidence of pulmonary artery embolism. There is ectasia of ascending thoracic aorta measuring 4.1 cm with no significant interval change. There is no evidence of thoracic aortic dissection.   Coronary artery calcifications are seen. Minimal pericardial effusion. Fatty liver. Gallbladder stones.   Other findings as described in the body of the report.  Echo  11/03/2021: 1. Left ventricular ejection fraction, by estimation, is 65 to 70%. The  left ventricle has normal function. The left ventricle has no regional  wall motion abnormalities. There is mild concentric left ventricular  hypertrophy. Left ventricular diastolic  parameters are consistent with Grade I diastolic dysfunction (impaired  relaxation).   2. Right ventricular systolic function is normal. The right ventricular  size is normal. Tricuspid regurgitation signal is inadequate for assessing  PA pressure.   3. The mitral valve is grossly normal. No evidence of mitral valve  regurgitation. No evidence of mitral stenosis.   4. The aortic valve is tricuspid. Aortic valve regurgitation is not  visualized. No aortic stenosis is present.   5. Aortic dilatation noted. There is mild dilatation of the aortic root,  measuring 40 mm.   Conclusion(s)/Recommendation(s): Normal biventricular function without  evidence of hemodynamically significant valvular heart disease.   Cath 02/16/21: 1. Moderate calcific, nonobstructive proximal/mid LAD stenosis estimated at 50% 2. Mild nonobstructive left main, RCA, ramus intermedius, and LCx stenosis with no significant lesions in any of those vessels 3. Normal LVEDP   Recommend:  Ongoing medical therapy - LAD stenosis appears  50% non-obstructive, similar to report of LAD on 2016 coronary CTA Resume warfarin today as instructed by anticoag clinic Resume lovenox tomorrow morning if bridging   Coronary CTA 07/30/2015 IMPRESSION: 1. Coronary calcium score of 1186. This was 62 percentile for age and sex matched control.   2. Normal coronary origin.   3. There is diffuse moderate CAD, with a long segment of calcified plaque in the proximal to mid LAD associated with 50-69% stenosis. No obstructive plaque was seen.   An aggressive medical therapy is recommended.   TTE 11/28/2018: Study Conclusions  - Left ventricle: The cavity size was normal. There was mild    concentric hypertrophy. Systolic function was normal. The    estimated ejection fraction was in the range of 60% to 65%. Wall    motion was normal; there were no regional wall motion    abnormalities. Doppler parameters are consistent with abnormal    left ventricular relaxation (grade 1 diastolic dysfunction).    Doppler parameters are consistent with indeterminate ventricular    filling pressure.  - Aortic valve: Transvalvular velocity was within the normal range.    There was no stenosis. There was no regurgitation. Valve area    (VTI): 2.67 cm^2. Valve area (Vmean): 3.07 cm^2.  - Aorta: Ascending aortic diameter: 41 mm (S).  - Ascending aorta: The ascending aorta was mildly dilated.  - Mitral valve: Transvalvular velocity was within the normal range.    There was no evidence for stenosis. There was no regurgitation.  - Right ventricle: The cavity size was normal. Wall thickness was    normal. Systolic function was normal.  - Tricuspid valve: There was no regurgitation.     EKG:  EKG is personally reviewed. 10/14/2022:  EKG was not ordered. 03/29/2022 (ED):   Sinus rhythm at 54 bpm. Right bundle branch block.   Recent Labs: 03/26/2022: ALT 28; TSH 1.80 03/28/2022: BUN 28; Creatinine, Ser 1.08; Potassium 4.4; Sodium 137 07/23/2022: Hemoglobin  12.3; Platelet Count 202   Recent Lipid Panel    Component Value Date/Time   CHOL 91 03/26/2022 1439   TRIG 55.0 03/26/2022 1439   HDL 41.00 03/26/2022 1439   CHOLHDL 2 03/26/2022 1439   VLDL 11.0 03/26/2022 1439   LDLCALC 39 03/26/2022 1439   LDLCALC 74 07/09/2020 1033   LDLDIRECT 116.0 01/15/2020 1624     Physical Exam:    VS:  BP 118/68   Pulse 63   Ht '6\' 2"'$  (1.88 m)   Wt 239 lb (108.4 kg)   SpO2 96%   BMI 30.69 kg/m     Wt Readings from Last 3 Encounters:  10/14/22 239 lb (108.4 kg)  09/01/22 241 lb (109.3 kg)  06/25/22 239 lb 12.8 oz (108.8 kg)     GEN:  Well nourished, well developed in no acute distress HEENT: Normal NECK: No JVD; No carotid bruits CARDIAC: RRR, 1/6 systolic murmur RUSB. No rubs or gallops RESPIRATORY:  Clear to auscultation without rales, wheezing or rhonchi  ABDOMEN: Soft, non-tender, non-distended MUSCULOSKELETAL:  No edema; No deformity  SKIN: Warm and dry NEUROLOGIC:  Alert and oriented x 3 PSYCHIATRIC:  Normal affect   ASSESSMENT:    1. Coronary artery disease involving native coronary artery of native heart without angina pectoris   2. Hyperlipidemia, unspecified hyperlipidemia type   3. Essential hypertension   4. History of GI bleed   5. History of pulmonary embolism   6. Type 2 diabetes  mellitus without complication, without long-term current use of insulin ([place])   7. Prostate cancer ([place])   8. Aortic root dilation (HCC)     PLAN:    In order of problems listed above:  #Moderate CAD: Coronary CTA in 2016 with calcium score 1186 which is 69 percentile for age and sex match control with 46 to 69% proximal to mid LAD. With recent episode of chest pain prompting coronary angiography which showed moderate disease. Plan for continued medical management. -Cath 02/2021 with moderate, nonobstructive disease -TTE 10/2021 with LVEF 65-70%, G1DD, normal RV, no significant valve disease -Continue lipitor '80mg'$  daily -Not on ASA due  to GIB; may consider adding in future   #History of PE/DVT: Occurred after surgery. Now off warfarin due to recent rectal bleeding.  -Off warfarin  #History of GIB: Patient with rectal bleeding after prostate biopsy requiring admission in 10/2021. S/p IR embolization of recatl artery. Warfarin has since been stopped. Hemoglobin improved to 11. Did not receive blood products as patient is Jehova's witness. -Management per heme -Off AC  #Aortic Root Dilation: Noted on TTE in 10/2021. -Continue serial monitoring with repeat echoes -BP and HLD control as below   #DMII: Managed by PCP. -Continue metformin and glipizide   #HTN: Controlled and at goal <130/90. -Continue chlorthalidone '25mg'$  daily   #HLD: -Continue lipitor '40mg'$  daily -LDL controlled at 39 03/2022  #Prostate Cancer: -S/p XRT -Follows with Dr. Burr Medico  Follow-up:  1 year.  Medication Adjustments/Labs and Tests Ordered: Current medicines are reviewed at length with the patient today.  Concerns regarding medicines are outlined above.   No orders of the defined types were placed in this encounter.  No orders of the defined types were placed in this encounter.  There are no Patient Instructions on file for this visit.  I,Mathew Stumpf,acting as a Education administrator for Freada Bergeron, MD.,have documented all relevant documentation on the behalf of Freada Bergeron, MD,as directed by  Freada Bergeron, MD while in the presence of Freada Bergeron, MD.  I, Freada Bergeron, MD, have reviewed all documentation for this visit. The documentation on 10/14/22 for the exam, diagnosis, procedures, and orders are all accurate and complete.    Signed, Freada Bergeron, MD  10/14/2022 9:15 AM    [place]

## 2022-10-14 NOTE — Patient Instructions (Signed)
Medication Instructions:   Your physician recommends that you continue on your current medications as directed. Please refer to the Current Medication list given to you today.  *If you need a refill on your cardiac medications before your next appointment, please call your pharmacy*   Testing/Procedures:  Your physician has requested that you have an echocardiogram. Echocardiography is a painless test that uses sound waves to create images of your heart. It provides your doctor with information about the size and shape of your heart and how well your heart's chambers and valves are working. This procedure takes approximately one hour. There are no restrictions for this procedure. Please do NOT wear cologne, perfume, aftershave, or lotions (deodorant is allowed). Please arrive 15 minutes prior to your appointment time.   Follow-Up: At [facility], you and your health needs are our priority.  As part of our continuing mission to provide you with exceptional heart care, we have created designated Provider Care Teams.  These Care Teams include your primary Cardiologist (physician) and Advanced Practice Providers (APPs -  Physician Assistants and Nurse Practitioners) who all work together to provide you with the care you need, when you need it.  We recommend signing up for the patient portal called "MyChart".  Sign up information is provided on this After Visit Summary.  MyChart is used to connect with patients for Virtual Visits (Telemedicine).  Patients are able to view lab/test results, encounter notes, upcoming appointments, etc.  Non-urgent messages can be sent to your provider as well.   To learn more about what you can do with MyChart, go to [email or web address].    Your next appointment:   1 year(s)  The format for your next appointment:   In Person  Provider:   Dr. Johney Frame   Important Information About Sugar

## 2022-10-22 ENCOUNTER — Other Ambulatory Visit: Payer: HMO

## 2022-10-22 ENCOUNTER — Ambulatory Visit: Payer: HMO | Admitting: Hematology

## 2022-10-25 ENCOUNTER — Other Ambulatory Visit: Payer: Self-pay | Admitting: Internal Medicine

## 2022-10-25 NOTE — Telephone Encounter (Signed)
Please refill as per office routine med refill policy (all routine meds to be refilled for 3 mo or monthly (per pt preference) up to one year from last visit, then month to month grace period for 3 mo, then further med refills will have to be denied) ? ?

## 2022-10-26 ENCOUNTER — Encounter: Payer: Self-pay | Admitting: Hematology

## 2022-10-26 ENCOUNTER — Inpatient Hospital Stay: Payer: HMO | Attending: Hematology

## 2022-10-26 ENCOUNTER — Other Ambulatory Visit: Payer: Self-pay

## 2022-10-26 ENCOUNTER — Inpatient Hospital Stay (HOSPITAL_BASED_OUTPATIENT_CLINIC_OR_DEPARTMENT_OTHER): Payer: HMO | Admitting: Hematology

## 2022-10-26 VITALS — BP 114/61 | HR 63 | Temp 98.3°F | Resp 18 | Ht 74.0 in | Wt 241.3 lb

## 2022-10-26 DIAGNOSIS — E785 Hyperlipidemia, unspecified: Secondary | ICD-10-CM | POA: Insufficient documentation

## 2022-10-26 DIAGNOSIS — Z86718 Personal history of other venous thrombosis and embolism: Secondary | ICD-10-CM | POA: Insufficient documentation

## 2022-10-26 DIAGNOSIS — Z888 Allergy status to other drugs, medicaments and biological substances status: Secondary | ICD-10-CM | POA: Diagnosis not present

## 2022-10-26 DIAGNOSIS — C61 Malignant neoplasm of prostate: Secondary | ICD-10-CM | POA: Insufficient documentation

## 2022-10-26 DIAGNOSIS — Z8719 Personal history of other diseases of the digestive system: Secondary | ICD-10-CM | POA: Diagnosis not present

## 2022-10-26 DIAGNOSIS — Z9884 Bariatric surgery status: Secondary | ICD-10-CM | POA: Insufficient documentation

## 2022-10-26 DIAGNOSIS — Z87442 Personal history of urinary calculi: Secondary | ICD-10-CM | POA: Diagnosis not present

## 2022-10-26 DIAGNOSIS — Z79899 Other long term (current) drug therapy: Secondary | ICD-10-CM | POA: Insufficient documentation

## 2022-10-26 DIAGNOSIS — D500 Iron deficiency anemia secondary to blood loss (chronic): Secondary | ICD-10-CM | POA: Diagnosis not present

## 2022-10-26 DIAGNOSIS — Z7901 Long term (current) use of anticoagulants: Secondary | ICD-10-CM | POA: Insufficient documentation

## 2022-10-26 DIAGNOSIS — D62 Acute posthemorrhagic anemia: Secondary | ICD-10-CM | POA: Insufficient documentation

## 2022-10-26 DIAGNOSIS — I2510 Atherosclerotic heart disease of native coronary artery without angina pectoris: Secondary | ICD-10-CM | POA: Insufficient documentation

## 2022-10-26 DIAGNOSIS — Z86711 Personal history of pulmonary embolism: Secondary | ICD-10-CM | POA: Insufficient documentation

## 2022-10-26 LAB — CBC WITH DIFFERENTIAL (CANCER CENTER ONLY)
Abs Immature Granulocytes: 0.01 10*3/uL (ref 0.00–0.07)
Basophils Absolute: 0 10*3/uL (ref 0.0–0.1)
Basophils Relative: 0 %
Eosinophils Absolute: 0.2 10*3/uL (ref 0.0–0.5)
Eosinophils Relative: 3 %
HCT: 37.7 % — ABNORMAL LOW (ref 39.0–52.0)
Hemoglobin: 11.8 g/dL — ABNORMAL LOW (ref 13.0–17.0)
Immature Granulocytes: 0 %
Lymphocytes Relative: 18 %
Lymphs Abs: 1.1 10*3/uL (ref 0.7–4.0)
MCH: 22.8 pg — ABNORMAL LOW (ref 26.0–34.0)
MCHC: 31.3 g/dL (ref 30.0–36.0)
MCV: 72.9 fL — ABNORMAL LOW (ref 80.0–100.0)
Monocytes Absolute: 0.6 10*3/uL (ref 0.1–1.0)
Monocytes Relative: 9 %
Neutro Abs: 4.4 10*3/uL (ref 1.7–7.7)
Neutrophils Relative %: 70 %
Platelet Count: 172 10*3/uL (ref 150–400)
RBC: 5.17 MIL/uL (ref 4.22–5.81)
RDW: 16.4 % — ABNORMAL HIGH (ref 11.5–15.5)
WBC Count: 6.3 10*3/uL (ref 4.0–10.5)
nRBC: 0 % (ref 0.0–0.2)

## 2022-10-26 LAB — FERRITIN: Ferritin: 193 ng/mL (ref 24–336)

## 2022-10-26 NOTE — Progress Notes (Signed)
[place]   [phone] [phone]   Clinic Follow up Note   Patient Care Team: Biagio Borg, MD as PCP - General Meda Coffee Jamse Belfast, MD as PCP - Cardiology (Cardiology) Suella Broad, MD as Consulting Physician (Physical Medicine and Rehabilitation) Eldridge Abrahams, MD as Referring Physician (Surgical Oncology) Renato Shin, MD (Inactive) as Consulting Physician (Endocrinology) Calvert Cantor, MD as Consulting Physician (Ophthalmology)  Date of Service:  10/26/2022  CHIEF COMPLAINT: f/u of anemia  CURRENT THERAPY:  Observation  ASSESSMENT:  [name] is a 66 y.o. male with   1.  Iron deficient anemia secondary to blood loss, and gastric bypass surgery -he developed rectal bleeding following prostate biopsy secondary to anticoagulation and was admitted for severe anemia 11/02/21 - 11/08/21 -Hemoglobin dropped to 4.9, he does not accept blood products as a Jehovah's Witness.  He underwent IR rectal artery embolization, received vitamin K, Aranesp, IV iron, and was started on oral W73 and folic acid. -he received IV Feraheme in 11/2021 and 08/2022. No clinically improvement after iv iron, will give iv iron only ferritin<20  -he is on oral iron once daily   2. H/o provoked bilateral PE after orthopedic surgery 2007 -He was on Coumadin for 15 years after PE, held during hospitalization for acute blood loss anemia  -Currently no signs of recurrent thrombosis or PE   3. Prostate cancer cT1cN0, Gleason 7 -diagnosed 10/2021 -s/p 5.5 weeks IMRT under Dr. Tammi Klippel 01/21/22 - 03/02/22 -f/u with urology      Plan: -he is feeling well, plan to just monitor him. -Labs reviewed, Hgb 11.8, ferritin is pending -continue oral iron -lab and f/u in 1 year   SUMMARY OF ONCOLOGIC HISTORY: Oncology History  Malignant neoplasm of prostate ([place])  10/26/2021 Cancer Staging   Staging form: Prostate, AJCC 8th Edition - Clinical stage from 10/26/2021:  Stage IIB (cT1c, cN0, cM0, PSA: 6.8, Grade Group: 2) - Signed by Freeman Caldron, PA-C on 01/05/2022 Histopathologic type: Adenocarcinoma, NOS Stage prefix: Initial diagnosis Prostate specific antigen (PSA) range: Less than 10 Gleason primary pattern: 3 Gleason secondary pattern: 4 Gleason score: 7 Histologic grading system: 5 grade system Number of biopsy cores examined: 12 Number of biopsy cores positive: 5 Location of positive needle core biopsies: Both sides   11/15/2021 Initial Diagnosis   Malignant neoplasm of prostate ([place])      INTERVAL HISTORY:  [name] is here for a follow up of anemia. He was last seen by me on 04/23/22. He presents to the clinic accompanied by his wife. He reports he is feeling well. He endorses taking oral iron. He notes he could not tell much of a difference after receiving IV iron.   All other systems were reviewed with the patient and are negative.  MEDICAL HISTORY:  Past Medical History:  Diagnosis Date   Arthritis    BACK PAIN    CAD (coronary artery disease) 06/28/2017   CAD in native artery    a. moderate by cardiac CT 2016.   Cancer [facility])    Chronic bilateral low back pain with bilateral sciatica 01/12/2019   Degenerative arthritis of right knee 07/15/2016   Injected in 07/15/2016 Orthovisc injection started 10/07/2016 DepoMedrol 40 inj on 04/11/17, 25 cc aspirated Orthovisc injection in 05/30/2017     DEGENERATIVE JOINT DISEASE, KNEES, BILATERAL    Depression    Diabetes ([place]) 10/01/2014   Diabetes mellitus    type II   DIVERTICULOSIS, COLON    DVT,  lower extremity ([place])    right   GERD (gastroesophageal reflux disease)    History of kidney stones    Hyperlipidemia    Hypertension    HYPOGONADISM, MALE    HYPOTENSION, ORTHOSTATIC    Kidney stones    Long term (current) use of anticoagulants 08/17/2017   Long term current use of anticoagulant    Lumbosacral radiculopathy at S1 06/14/2012   Maxillary sinus cyst  12/11/2018   Obesity    OBESITY, MORBID 03/04/2008   Qualifier: Diagnosis of  By: Jenny Reichmann MD, Hunt Oris    OSA (obstructive sleep apnea) 09/11/2013   Pulmonary emboli (HCC)    Pulmonary embolism ([place]) 02/05/2011   RBBB 07/28/2017   Renal insufficiency    a. prior h/o, does not appear chronic.   Superficial phlebitis of arm 03/18/2016   Vitamin D deficiency 07/09/2020    SURGICAL HISTORY: Past Surgical History:  Procedure Laterality Date   ankle surgury  2001 bilateral   with bone spurs and torn tendon   COLONOSCOPY  03/18/2010   EDG  2008   chronic Duodenitis   FLEXIBLE SIGMOIDOSCOPY N/A 11/03/2021   Procedure: FLEXIBLE SIGMOIDOSCOPY;  Surgeon: Doran Stabler, MD;  [facility];  Service: Gastroenterology;  Laterality: N/A;   GASTRIC BYPASS     HEEL SPUR SURGERY Bilateral 2005   IR ANGIOGRAM PELVIS SELECTIVE OR SUPRASELECTIVE  11/02/2021   IR ANGIOGRAM PELVIS SELECTIVE OR SUPRASELECTIVE  11/04/2021   IR ANGIOGRAM SELECTIVE EACH ADDITIONAL VESSEL  11/02/2021   IR ANGIOGRAM SELECTIVE EACH ADDITIONAL VESSEL  11/02/2021   IR ANGIOGRAM SELECTIVE EACH ADDITIONAL VESSEL  11/02/2021   IR ANGIOGRAM SELECTIVE EACH ADDITIONAL VESSEL  11/04/2021   IR ANGIOGRAM SELECTIVE EACH ADDITIONAL VESSEL  11/04/2021   IR ANGIOGRAM SELECTIVE EACH ADDITIONAL VESSEL  11/04/2021   IR ANGIOGRAM SELECTIVE EACH ADDITIONAL VESSEL  11/04/2021   IR ANGIOGRAM VISCERAL SELECTIVE  11/02/2021   IR ANGIOGRAM VISCERAL SELECTIVE  11/04/2021   IR EMBO ART  VEN HEMORR LYMPH EXTRAV  INC GUIDE ROADMAPPING  11/02/2021   IR EMBO ART  VEN HEMORR LYMPH EXTRAV  INC GUIDE ROADMAPPING  11/04/2021   IR EMBO ART  VEN HEMORR LYMPH EXTRAV  INC GUIDE ROADMAPPING  11/04/2021   IR US GUIDE VASC ACCESS RIGHT  11/02/2021   IR US GUIDE [place] RIGHT  11/04/2021   knee surgury  2000 & 2003    cartilage damage   LEFT HEART CATH AND CORONARY ANGIOGRAPHY N/A 02/16/2021   Procedure: LEFT HEART CATH AND CORONARY ANGIOGRAPHY;   Surgeon: Sherren Mocha, MD;  Location: [facility];  Service: Cardiovascular;  Laterality: N/A;   LUMBAR LAMINECTOMY/DECOMPRESSION MICRODISCECTOMY  09/28/2012   Procedure: LUMBAR LAMINECTOMY/DECOMPRESSION MICRODISCECTOMY 2 LEVELS;  Surgeon: [name], MD;  [facility];  Service: Orthopedics;  Laterality: Left;  Decompressive laminectomy L4-L5. Microdiscectomy L5-S1   Skin removal surgery     extra abdominal skin removed from his weight loss   TONSILLECTOMY AND ADENOIDECTOMY  age 63 or 6   TOTAL KNEE ARTHROPLASTY  01/10/2012   Procedure: TOTAL KNEE ARTHROPLASTY;  Surgeon: [name], MD;  [facility];  Service: Orthopedics;  Laterality: Left;   TOTAL KNEE ARTHROPLASTY Right 11/18/2020   Procedure: TOTAL KNEE ARTHROPLASTY;  Surgeon: Paralee Cancel, MD;  [facility];  Service: Orthopedics;  Laterality: Right;  70 mins   UPPER GASTROINTESTINAL ENDOSCOPY  03/18/2010    I have reviewed the social history and family history with the patient and  they are unchanged from previous note.  ALLERGIES:  is allergic to adhesive [tape] and other.  MEDICATIONS:  Current Outpatient Medications  Medication Sig Dispense Refill   Artificial Tear Ointment (ARTIFICIAL TEARS) ointment Place 1 drop into both eyes every 4 (four) hours as needed (dry eyes).      atorvastatin (LIPITOR) 80 MG tablet TAKE 1 TABLET BY MOUTH EVERY DAY 90 tablet 2   chlorthalidone (HYGROTON) 25 MG tablet TAKE 1 TABLET BY MOUTH EVERY DAY 90 tablet 3   Cholecalciferol (VITAMIN D3) 125 MCG (5000 UT) CAPS Take 5,000 Units by mouth daily.      ferrous sulfate 325 (65 FE) MG EC tablet TAKE 1 TABLET BY MOUTH EVERY DAY 90 tablet 1   folic acid (FOLVITE) 1 MG tablet TAKE 1 TABLET BY MOUTH EVERY DAY 90 tablet 1   glipiZIDE (GLUCOTROL XL) 2.5 MG 24 hr tablet Take 2.5 mg by mouth every morning.     glucose blood (ONETOUCH ULTRA) test strip Use as instructed 100 each 12   Lidocaine-Hydrocort, Perianal, 3-0.5 % CREA Use  as directed three times per day as needed 28.3 g 1   metFORMIN (GLUCOPHAGE) 500 MG tablet TAKE 4 TABLETS BY MOUTH DAILY WITH BREAKFAST. 360 tablet 3   mirtazapine (REMERON) 15 MG tablet TAKE 1 TABLET BY MOUTH EVERYDAY AT BEDTIME 90 tablet 0   Multiple Vitamin (MULTIVITAMIN WITH MINERALS) TABS tablet Take 1 tablet by mouth daily at 12 noon.     nitroGLYCERIN (NITROSTAT) 0.4 MG SL tablet Place 1 tablet (0.4 mg total) under the tongue every 5 (five) minutes as needed for chest pain. 90 tablet 3   ondansetron (ZOFRAN) 4 MG tablet Take 1 tablet (4 mg total) by mouth every 8 (eight) hours as needed for nausea or vomiting. 30 tablet 3   vitamin B-12 1000 MCG tablet Take 1 tablet (1,000 mcg total) by mouth daily. 30 tablet 1   No current facility-administered medications for this visit.    PHYSICAL EXAMINATION: ECOG PERFORMANCE STATUS: 0 - Asymptomatic  Vitals:   10/26/22 1446  BP: 114/61  Pulse: 63  Resp: 18  Temp: 98.3 F (36.8 C)  SpO2: 100%   Wt Readings from Last 3 Encounters:  10/26/22 241 lb 4.8 oz (109.5 kg)  10/14/22 239 lb (108.4 kg)  09/01/22 241 lb (109.3 kg)     GENERAL:alert, no distress and comfortable SKIN: skin color normal, no rashes or significant lesions EYES: normal, Conjunctiva are pink and non-injected, sclera clear  NEURO: alert & oriented x 3 with fluent speech  LABORATORY DATA:  I have reviewed the data as listed    Latest Ref Rng & Units 10/26/2022    2:15 PM 07/23/2022    9:12 AM 04/23/2022    8:07 AM  CBC  WBC 4.0 - 10.5 K/uL 6.3  5.8  5.2   Hemoglobin 13.0 - 17.0 g/dL 11.8  12.3  11.5   Hematocrit 39.0 - 52.0 % 37.7  37.6  36.0   Platelets 150 - 400 K/uL 172  202  203         Latest Ref Rng & Units 03/28/2022    8:21 AM 03/26/2022    2:39 PM 03/23/2022    2:41 PM  CMP  Glucose 70 - 99 mg/dL 128   75   BUN 8 - 23 mg/dL 28   25   Creatinine 0.61 - 1.24 mg/dL 1.08   1.25   Sodium 135 - 145 mmol/L 137   140  Potassium 3.5 - 5.1 mmol/L 4.4   4.8    Chloride 98 - 111 mmol/L 103   99   CO2 22 - 32 mmol/L 29   23   Calcium 8.9 - 10.3 mg/dL 9.1   9.4   Total Protein 6.0 - 8.3 g/dL  7.0    Total Bilirubin 0.2 - 1.2 mg/dL  0.6    Alkaline Phos 39 - 117 U/L  67    AST 0 - 37 U/L  20    ALT 0 - 53 U/L  28        RADIOGRAPHIC STUDIES: I have personally reviewed the radiological images as listed and agreed with the findings in the report. No results found.    No orders of the defined types were placed in this encounter.  All questions were answered. The patient knows to call the clinic with any problems, questions or concerns. No barriers to learning was detected. The total time spent in the appointment was 20 minutes.     Truitt Merle, MD 10/26/2022   I, Wilburn Mylar, am acting as scribe for Truitt Merle, MD.   I have reviewed the above documentation for accuracy and completeness, and I agree with the above.

## 2022-10-27 ENCOUNTER — Telehealth: Payer: Self-pay | Admitting: Hematology

## 2022-11-02 ENCOUNTER — Other Ambulatory Visit: Payer: HMO

## 2022-11-02 ENCOUNTER — Encounter (HOSPITAL_COMMUNITY): Payer: Self-pay

## 2022-11-02 ENCOUNTER — Ambulatory Visit: Payer: HMO | Admitting: Hematology

## 2022-11-02 ENCOUNTER — Other Ambulatory Visit (HOSPITAL_COMMUNITY): Payer: HMO

## 2022-11-03 ENCOUNTER — Encounter (HOSPITAL_COMMUNITY): Payer: Self-pay | Admitting: Cardiology

## 2022-11-04 ENCOUNTER — Telehealth: Payer: Self-pay | Admitting: Oncology

## 2022-11-09 ENCOUNTER — Ambulatory Visit (HOSPITAL_COMMUNITY): Payer: HMO | Attending: Cardiovascular Disease

## 2022-11-09 DIAGNOSIS — I77810 Thoracic aortic ectasia: Secondary | ICD-10-CM | POA: Diagnosis present

## 2022-11-09 DIAGNOSIS — I517 Cardiomegaly: Secondary | ICD-10-CM | POA: Insufficient documentation

## 2022-11-09 DIAGNOSIS — I700 Atherosclerosis of aorta: Secondary | ICD-10-CM | POA: Insufficient documentation

## 2022-11-09 LAB — ECHOCARDIOGRAM COMPLETE
Area-P 1/2: 2 cm2
S' Lateral: 2.5 cm

## 2022-11-10 ENCOUNTER — Telehealth: Payer: Self-pay | Admitting: *Deleted

## 2022-11-10 DIAGNOSIS — I77810 Thoracic aortic ectasia: Secondary | ICD-10-CM

## 2022-11-10 DIAGNOSIS — I77819 Aortic ectasia, unspecified site: Secondary | ICD-10-CM

## 2022-11-10 NOTE — Telephone Encounter (Signed)
-----   Message from Nuala Alpha, LPN sent at 09/06/7252 11:00 AM EST -----  ----- Message ----- From: Freada Bergeron, MD Sent: 11/09/2022   8:03 PM EST To: [facility]v [facility] Triage  His echo shows normal pumping function with no significant valve disease. He has mild dilation of the aorta which is stable from prior. Will continue with yearly monitoring.

## 2022-11-10 NOTE — Telephone Encounter (Signed)
The patient has been notified of the result and verbalized understanding.  All questions (if any) were answered.  Pt aware he will need another echo in one year for surveillance.  He is aware that I will go ahead and place the order for this and send a message to our [facility] Schedulers to call him back and arrange this appt for one year out, closer to that time.   Pt verbalized understanding and agrees with this plan.

## 2022-11-12 ENCOUNTER — Other Ambulatory Visit: Payer: Self-pay | Admitting: Internal Medicine

## 2022-11-22 LAB — FECAL OCCULT BLOOD, GUAIAC: Fecal Occult Blood: NEGATIVE

## 2022-11-27 ENCOUNTER — Other Ambulatory Visit: Payer: Self-pay | Admitting: Internal Medicine

## 2022-12-08 DIAGNOSIS — C61 Malignant neoplasm of prostate: Secondary | ICD-10-CM | POA: Diagnosis not present

## 2022-12-13 DIAGNOSIS — I2510 Atherosclerotic heart disease of native coronary artery without angina pectoris: Secondary | ICD-10-CM | POA: Diagnosis not present

## 2022-12-13 DIAGNOSIS — E119 Type 2 diabetes mellitus without complications: Secondary | ICD-10-CM | POA: Diagnosis not present

## 2022-12-13 DIAGNOSIS — Z7984 Long term (current) use of oral hypoglycemic drugs: Secondary | ICD-10-CM | POA: Diagnosis not present

## 2022-12-13 DIAGNOSIS — Z8546 Personal history of malignant neoplasm of prostate: Secondary | ICD-10-CM | POA: Diagnosis not present

## 2022-12-13 DIAGNOSIS — I10 Essential (primary) hypertension: Secondary | ICD-10-CM | POA: Diagnosis not present

## 2022-12-14 DIAGNOSIS — Z8546 Personal history of malignant neoplasm of prostate: Secondary | ICD-10-CM | POA: Diagnosis not present

## 2022-12-17 DIAGNOSIS — Z96652 Presence of left artificial knee joint: Secondary | ICD-10-CM | POA: Diagnosis not present

## 2022-12-17 DIAGNOSIS — M25562 Pain in left knee: Secondary | ICD-10-CM | POA: Diagnosis not present

## 2022-12-21 ENCOUNTER — Other Ambulatory Visit (INDEPENDENT_AMBULATORY_CARE_PROVIDER_SITE_OTHER): Payer: HMO

## 2022-12-21 DIAGNOSIS — N183 Chronic kidney disease, stage 3 unspecified: Secondary | ICD-10-CM

## 2022-12-21 DIAGNOSIS — E1122 Type 2 diabetes mellitus with diabetic chronic kidney disease: Secondary | ICD-10-CM | POA: Diagnosis not present

## 2022-12-21 DIAGNOSIS — Z125 Encounter for screening for malignant neoplasm of prostate: Secondary | ICD-10-CM | POA: Diagnosis not present

## 2022-12-21 DIAGNOSIS — E538 Deficiency of other specified B group vitamins: Secondary | ICD-10-CM | POA: Diagnosis not present

## 2022-12-21 DIAGNOSIS — E559 Vitamin D deficiency, unspecified: Secondary | ICD-10-CM

## 2022-12-21 LAB — HEPATIC FUNCTION PANEL
ALT: 35 U/L (ref 0–53)
AST: 22 U/L (ref 0–37)
Albumin: 4.6 g/dL (ref 3.5–5.2)
Alkaline Phosphatase: 86 U/L (ref 39–117)
Bilirubin, Direct: 0.1 mg/dL (ref 0.0–0.3)
Total Bilirubin: 0.6 mg/dL (ref 0.2–1.2)
Total Protein: 6.9 g/dL (ref 6.0–8.3)

## 2022-12-21 LAB — BASIC METABOLIC PANEL
BUN: 25 mg/dL — ABNORMAL HIGH (ref 6–23)
CO2: 29 mEq/L (ref 19–32)
Calcium: 9.7 mg/dL (ref 8.4–10.5)
Chloride: 102 mEq/L (ref 96–112)
Creatinine, Ser: 1.33 mg/dL (ref 0.40–1.50)
GFR: 54.73 mL/min — ABNORMAL LOW (ref >60.00)
Glucose, Bld: 146 mg/dL — ABNORMAL HIGH (ref 70–99)
Potassium: 4.8 mEq/L (ref 3.5–5.1)
Sodium: 140 mEq/L (ref 135–145)

## 2022-12-21 LAB — VITAMIN D 25 HYDROXY (VIT D DEFICIENCY, FRACTURES): VITD: 38.61 ng/mL (ref 30.00–100.00)

## 2022-12-21 LAB — CBC WITH DIFFERENTIAL/PLATELET
Basophils Absolute: 0 10*3/uL (ref 0.0–0.1)
Basophils Relative: 0.2 % (ref 0.0–3.0)
Eosinophils Absolute: 0.1 10*3/uL (ref 0.0–0.7)
Eosinophils Relative: 2.1 % (ref 0.0–5.0)
HCT: 41.4 % (ref 39.0–52.0)
Hemoglobin: 13 g/dL (ref 13.0–17.0)
Lymphocytes Relative: 23.8 % (ref 12.0–46.0)
Lymphs Abs: 1.3 10*3/uL (ref 0.7–4.0)
MCHC: 31.3 g/dL (ref 30.0–36.0)
MCV: 72.7 fl — ABNORMAL LOW (ref 78.0–100.0)
Monocytes Absolute: 0.4 10*3/uL (ref 0.1–1.0)
Monocytes Relative: 7.3 % (ref 3.0–12.0)
Neutro Abs: 3.7 10*3/uL (ref 1.4–7.7)
Neutrophils Relative %: 66.6 % (ref 43.0–77.0)
Platelets: 186 10*3/uL (ref 150.0–400.0)
RBC: 5.7 Mil/uL (ref 4.22–5.81)
RDW: 15.2 % (ref 11.5–15.5)
WBC: 5.6 10*3/uL (ref 4.0–10.5)

## 2022-12-21 LAB — URINALYSIS, ROUTINE W REFLEX MICROSCOPIC
Bilirubin Urine: NEGATIVE
Hgb urine dipstick: NEGATIVE
Ketones, ur: NEGATIVE
Nitrite: NEGATIVE
Specific Gravity, Urine: 1.01 (ref 1.000–1.030)
Total Protein, Urine: NEGATIVE
Urine Glucose: NEGATIVE
Urobilinogen, UA: 0.2 (ref 0.0–1.0)
pH: 6.5 (ref 5.0–8.0)

## 2022-12-21 LAB — LIPID PANEL
Cholesterol: 121 mg/dL (ref 0–200)
HDL: 49.7 mg/dL (ref >39.00)
LDL Cholesterol: 51 mg/dL (ref 0–99)
NonHDL: 71.1
Total CHOL/HDL Ratio: 2
Triglycerides: 102 mg/dL (ref 0.0–149.0)
VLDL: 20.4 mg/dL (ref 0.0–40.0)

## 2022-12-21 LAB — TSH: TSH: 1.56 u[IU]/mL (ref 0.35–5.50)

## 2022-12-21 LAB — MICROALBUMIN / CREATININE URINE RATIO
Creatinine,U: 124.5 mg/dL
Microalb Creat Ratio: 0.6 mg/g (ref 0.0–30.0)
Microalb, Ur: 0.8 mg/dL (ref 0.0–1.9)

## 2022-12-21 LAB — VITAMIN B12: Vitamin B-12: 1062 pg/mL — ABNORMAL HIGH (ref 211–911)

## 2022-12-21 LAB — HEMOGLOBIN A1C: Hgb A1c MFr Bld: 6.8 % — ABNORMAL HIGH (ref 4.6–6.5)

## 2022-12-21 LAB — PSA: PSA: 0.72 ng/mL (ref 0.10–4.00)

## 2022-12-27 ENCOUNTER — Encounter: Payer: Self-pay | Admitting: Internal Medicine

## 2022-12-27 ENCOUNTER — Encounter: Payer: Self-pay | Admitting: Nurse Practitioner

## 2022-12-27 ENCOUNTER — Ambulatory Visit (INDEPENDENT_AMBULATORY_CARE_PROVIDER_SITE_OTHER): Payer: PPO | Admitting: Internal Medicine

## 2022-12-27 VITALS — BP 108/68 | HR 72 | Temp 98.7°F | Ht 74.0 in | Wt 240.0 lb

## 2022-12-27 DIAGNOSIS — N1831 Chronic kidney disease, stage 3a: Secondary | ICD-10-CM

## 2022-12-27 DIAGNOSIS — Z0001 Encounter for general adult medical examination with abnormal findings: Secondary | ICD-10-CM

## 2022-12-27 DIAGNOSIS — N183 Chronic kidney disease, stage 3 unspecified: Secondary | ICD-10-CM

## 2022-12-27 DIAGNOSIS — E538 Deficiency of other specified B group vitamins: Secondary | ICD-10-CM

## 2022-12-27 DIAGNOSIS — Z0000 Encounter for general adult medical examination without abnormal findings: Secondary | ICD-10-CM

## 2022-12-27 DIAGNOSIS — E1122 Type 2 diabetes mellitus with diabetic chronic kidney disease: Secondary | ICD-10-CM

## 2022-12-27 DIAGNOSIS — E559 Vitamin D deficiency, unspecified: Secondary | ICD-10-CM | POA: Diagnosis not present

## 2022-12-27 NOTE — Assessment & Plan Note (Signed)
Lab Results  Component Value Date   VITAMINB12 1,062 (H) 12/21/2022   Stable, cont oral replacement - b12 1000 mcg qd

## 2022-12-27 NOTE — Progress Notes (Signed)
Patient ID: JAHRON HUNSINGER, male   DOB: 02-28-1956, 67 y.o.   MRN: [id number]         Chief Complaint:: wellness exam and DM, low B12, ckd, low vit d       HPI:  [name] is a 67 y.o. male here for wellness exam; declines covid booster, will have shingrix at pharmacy, o/w up to date                        Also Pt denies chest pain, increased sob or doe, wheezing, orthopnea, PND, increased LE swelling, palpitations, dizziness or syncope.   Pt denies polydipsia, polyuria, or new focal neuro s/s.    Pt denies fever, wt loss, night sweats, loss of appetite, or other constitutional symptoms  BP has been approx sbp 120 commonly at home.     Wt Readings from Last 3 Encounters:  12/27/22 240 lb (108.9 kg)  10/26/22 241 lb 4.8 oz (109.5 kg)  10/14/22 239 lb (108.4 kg)   BP Readings from Last 3 Encounters:  12/27/22 108/68  10/26/22 114/61  10/14/22 118/68   Immunization History  Administered Date(s) Administered   Fluad Quad(high Dose 65+) 10/05/2019, 10/07/2020, 09/14/2021, 08/30/2022   Influenza Split 08/19/2011, 10/30/2012   Influenza Whole 09/21/2005, 09/02/2008, 12/24/2009   Influenza,inj,Quad PF,6+ Mos 09/13/2013, 09/30/2014, 10/29/2015, 11/15/2016, 09/23/2017, 09/19/2018   PFIZER Comirnaty(Gray Top)Covid-19 Tri-Sucrose Vaccine 07/09/2021, 08/30/2022   PFIZER(Purple Top)SARS-COV-2 Vaccination 01/03/2020, 01/27/2020, 10/31/2020   Pneumococcal Conjugate-13 03/13/2014   Pneumococcal Polysaccharide-23 03/04/2008, 06/28/2016, 11/28/2018   Td 03/11/2009   Td (Adult), 2 Lf Tetanus Toxid, Preservative Free 03/11/2009   Tdap 07/13/2019   Zoster Recombinat (Shingrix) 08/30/2022   Zoster, Live 10/29/2015   Health Maintenance Due  Topic Date Due   Medicare Annual Wellness (AWV)  10/07/2021      Past Medical History:  Diagnosis Date   Arthritis    BACK PAIN    CAD (coronary artery disease) 06/28/2017   CAD in native artery    a. moderate by cardiac CT 2016.   Cancer [facility])     Chronic bilateral low back pain with bilateral sciatica 01/12/2019   Degenerative arthritis of right knee 07/15/2016   Injected in 07/15/2016 Orthovisc injection started 10/07/2016 DepoMedrol 40 inj on 04/11/17, 25 cc aspirated Orthovisc injection in 05/30/2017     DEGENERATIVE JOINT DISEASE, KNEES, BILATERAL    Depression    Diabetes ([place]) 10/01/2014   Diabetes mellitus    type II   DIVERTICULOSIS, COLON    DVT, lower extremity (HCC)    right   GERD (gastroesophageal reflux disease)    History of kidney stones    Hyperlipidemia    Hypertension    HYPOGONADISM, MALE    HYPOTENSION, ORTHOSTATIC    Kidney stones    Long term (current) use of anticoagulants 08/17/2017   Long term current use of anticoagulant    Lumbosacral radiculopathy at S1 06/14/2012   Maxillary sinus cyst 12/11/2018   Obesity    OBESITY, MORBID 03/04/2008   Qualifier: Diagnosis of  By: Jenny Reichmann MD, Hunt Oris    OSA (obstructive sleep apnea) 09/11/2013   Pulmonary emboli (HCC)    Pulmonary embolism ([place]) 02/05/2011   RBBB 07/28/2017   Renal insufficiency    a. prior h/o, does not appear chronic.   Superficial phlebitis of arm 03/18/2016   Vitamin D deficiency 07/09/2020   Past Surgical History:  Procedure Laterality Date   ankle surgury  2001 bilateral  with bone spurs and torn tendon   COLONOSCOPY  03/18/2010   EDG  2008   chronic Duodenitis   FLEXIBLE SIGMOIDOSCOPY N/A 11/03/2021   Procedure: FLEXIBLE SIGMOIDOSCOPY;  Surgeon: Doran Stabler, MD;  [facility];  Service: Gastroenterology;  Laterality: N/A;   GASTRIC BYPASS     HEEL SPUR SURGERY Bilateral 2005   IR ANGIOGRAM PELVIS SELECTIVE OR SUPRASELECTIVE  11/02/2021   IR ANGIOGRAM PELVIS SELECTIVE OR SUPRASELECTIVE  11/04/2021   IR ANGIOGRAM SELECTIVE EACH ADDITIONAL VESSEL  11/02/2021   IR ANGIOGRAM SELECTIVE EACH ADDITIONAL VESSEL  11/02/2021   IR ANGIOGRAM SELECTIVE EACH ADDITIONAL VESSEL  11/02/2021   IR ANGIOGRAM SELECTIVE EACH  ADDITIONAL VESSEL  11/04/2021   IR ANGIOGRAM SELECTIVE EACH ADDITIONAL VESSEL  11/04/2021   IR ANGIOGRAM SELECTIVE EACH ADDITIONAL VESSEL  11/04/2021   IR ANGIOGRAM SELECTIVE EACH ADDITIONAL VESSEL  11/04/2021   IR ANGIOGRAM VISCERAL SELECTIVE  11/02/2021   IR ANGIOGRAM VISCERAL SELECTIVE  11/04/2021   IR EMBO ART  VEN HEMORR LYMPH EXTRAV  INC GUIDE ROADMAPPING  11/02/2021   IR EMBO ART  VEN HEMORR LYMPH EXTRAV  INC GUIDE ROADMAPPING  11/04/2021   IR EMBO ART  VEN HEMORR LYMPH EXTRAV  INC GUIDE ROADMAPPING  11/04/2021   IR US GUIDE VASC ACCESS RIGHT  11/02/2021   IR US GUIDE [place] RIGHT  11/04/2021   knee surgury  2000 & 2003    cartilage damage   LEFT HEART CATH AND CORONARY ANGIOGRAPHY N/A 02/16/2021   Procedure: LEFT HEART CATH AND CORONARY ANGIOGRAPHY;  Surgeon: Sherren Mocha, MD;  Location: [place] [facility];  Service: Cardiovascular;  Laterality: N/A;   LUMBAR LAMINECTOMY/DECOMPRESSION MICRODISCECTOMY  09/28/2012   Procedure: LUMBAR LAMINECTOMY/DECOMPRESSION MICRODISCECTOMY 2 LEVELS;  Surgeon: [name], MD;  [facility];  Service: Orthopedics;  Laterality: Left;  Decompressive laminectomy L4-L5. Microdiscectomy L5-S1   Skin removal surgery     extra abdominal skin removed from his weight loss   TONSILLECTOMY AND ADENOIDECTOMY  age 1 or 6   TOTAL KNEE ARTHROPLASTY  01/10/2012   Procedure: TOTAL KNEE ARTHROPLASTY;  Surgeon: [name], MD;  [facility];  Service: Orthopedics;  Laterality: Left;   TOTAL KNEE ARTHROPLASTY Right 11/18/2020   Procedure: TOTAL KNEE ARTHROPLASTY;  Surgeon: Paralee Cancel, MD;  [facility];  Service: Orthopedics;  Laterality: Right;  70 mins   UPPER GASTROINTESTINAL ENDOSCOPY  03/18/2010    reports that he quit smoking about 44 years ago. His smoking use included cigarettes. He has a 1.25 pack-year smoking history. He has never used smokeless tobacco. He reports current alcohol use of about 2.0 standard drinks of alcohol per  week. He reports that he does not use drugs. family history includes Diabetes in his brother, sister, sister, and sister; Heart failure in his sister; Hypertension in his mother; Prostate cancer in his father. Allergies  Allergen Reactions   Adhesive [Tape] Other (See Comments)    Painful, Please use "paper" tape   Other     NO BLOOD -JEHOVAH'S WITNESS-SIGNED REFUSAL BUT WOULD TAKE ALBUMIN   Current Outpatient Medications on File Prior to Visit  Medication Sig Dispense Refill   Artificial Tear Ointment (ARTIFICIAL TEARS) ointment Place 1 drop into both eyes every 4 (four) hours as needed (dry eyes).      atorvastatin (LIPITOR) 80 MG tablet TAKE 1 TABLET BY MOUTH EVERY DAY 90 tablet 2   chlorthalidone (HYGROTON) 25 MG tablet TAKE 1 TABLET BY MOUTH EVERY DAY  90 tablet 3   Cholecalciferol (VITAMIN D3) 125 MCG (5000 UT) CAPS Take 5,000 Units by mouth daily.      ferrous sulfate 325 (65 FE) MG EC tablet TAKE 1 TABLET BY MOUTH EVERY DAY 90 tablet 1   folic acid (FOLVITE) 1 MG tablet TAKE 1 TABLET BY MOUTH EVERY DAY 90 tablet 1   glipiZIDE (GLUCOTROL XL) 2.5 MG 24 hr tablet TAKE 1 TABLET BY MOUTH EVERY DAY WITH BREAKFAST 90 tablet 3   glucose blood (ONETOUCH ULTRA) test strip Use as instructed 100 each 12   Lidocaine-Hydrocort, Perianal, 3-0.5 % CREA Use as directed three times per day as needed 28.3 g 1   metFORMIN (GLUCOPHAGE) 500 MG tablet TAKE 4 TABLETS BY MOUTH DAILY WITH BREAKFAST. 360 tablet 3   mirtazapine (REMERON) 15 MG tablet TAKE 1 TABLET BY MOUTH EVERYDAY AT BEDTIME 90 tablet 0   Multiple Vitamin (MULTIVITAMIN WITH MINERALS) TABS tablet Take 1 tablet by mouth daily at 12 noon.     nitroGLYCERIN (NITROSTAT) 0.4 MG SL tablet Place 1 tablet (0.4 mg total) under the tongue every 5 (five) minutes as needed for chest pain. 90 tablet 3   ondansetron (ZOFRAN) 4 MG tablet Take 1 tablet (4 mg total) by mouth every 8 (eight) hours as needed for nausea or vomiting. 30 tablet 3   vitamin B-12 1000  MCG tablet Take 1 tablet (1,000 mcg total) by mouth daily. 30 tablet 1   ferrous sulfate 325 (65 FE) MG EC tablet Take 1 tablet by mouth daily.     omeprazole (PRILOSEC) 40 MG capsule TAKE 1 CAP BY MOUTH DAILY. OPEN CAPSULE AND SWALLOW CONTENTS IN LIQUID OR APPLESAUCE     No current facility-administered medications on file prior to visit.        ROS:  All others reviewed and negative.  Objective        PE:  BP 108/68 (BP Location: Left Arm, Patient Position: Sitting, Cuff Size: Large)   Pulse 72   Temp 98.7 F (37.1 C) (Oral)   Ht '6\' 2"'$  (1.88 m)   Wt 240 lb (108.9 kg)   SpO2 93%   BMI 30.81 kg/m                 Constitutional: Pt appears in NAD               HENT: Head: NCAT.                Right Ear: External ear normal.                 Left Ear: External ear normal.                Eyes: . Pupils are equal, round, and reactive to light. Conjunctivae and EOM are normal               Nose: without d/c or deformity               Neck: Neck supple. Gross normal ROM               Cardiovascular: Normal rate and regular rhythm.                 Pulmonary/Chest: Effort normal and breath sounds without rales or wheezing.                Abd:  Soft, NT, ND, + BS, no organomegaly  Neurological: Pt is alert. At baseline orientation, motor grossly intact               Skin: Skin is warm. No rashes, no other new lesions, LE edema - none               Psychiatric: Pt behavior is normal without agitation   Micro: none  Cardiac tracings I have personally interpreted today:  none  Pertinent Radiological findings (summarize): none   Lab Results  Component Value Date   WBC 5.6 12/21/2022   HGB 13.0 12/21/2022   HCT 41.4 12/21/2022   PLT 186.0 12/21/2022   GLUCOSE 146 (H) 12/21/2022   CHOL 121 12/21/2022   TRIG 102.0 12/21/2022   HDL 49.70 12/21/2022   LDLDIRECT 116.0 01/15/2020   LDLCALC 51 12/21/2022   ALT 35 12/21/2022   AST 22 12/21/2022   NA 140 12/21/2022   K 4.8  12/21/2022   CL 102 12/21/2022   CREATININE 1.33 12/21/2022   BUN 25 (H) 12/21/2022   CO2 29 12/21/2022   TSH 1.56 12/21/2022   PSA 0.72 12/21/2022   INR 1.2 11/03/2021   HGBA1C 6.8 (H) 12/21/2022   MICROALBUR 0.8 12/21/2022   Assessment/Plan:  HOPE HOLST is a 67 y.o. Black or African American [2] male with  has a past medical history of Arthritis, BACK PAIN, CAD (coronary artery disease) (06/28/2017), CAD in native artery, Cancer ([place]), Chronic bilateral low back pain with bilateral sciatica (01/12/2019), Degenerative arthritis of right knee (07/15/2016), DEGENERATIVE JOINT DISEASE, KNEES, BILATERAL, Depression, Diabetes ([place]) (10/01/2014), Diabetes mellitus, DIVERTICULOSIS, COLON, DVT, lower extremity ([place]), GERD (gastroesophageal reflux disease), History of kidney stones, Hyperlipidemia, Hypertension, HYPOGONADISM, MALE, HYPOTENSION, ORTHOSTATIC, Kidney stones, Long term (current) use of anticoagulants (08/17/2017), Long term current use of anticoagulant, Lumbosacral radiculopathy at S1 (06/14/2012), Maxillary sinus cyst (12/11/2018), Obesity, OBESITY, MORBID (03/04/2008), OSA (obstructive sleep apnea) (09/11/2013), Pulmonary emboli ([place]), Pulmonary embolism ([place]) (02/05/2011), RBBB (07/28/2017), Renal insufficiency, Superficial phlebitis of arm (03/18/2016), and Vitamin D deficiency (07/09/2020).  Encounter for well adult exam with abnormal findings Age and sex appropriate education and counseling updated with regular exercise and diet Referrals for preventative services - none needed Immunizations addressed - decliens covid booster,but for shingrix at pharmacy Smoking counseling  - none needed Evidence for depression or other mood disorder - none significant Most recent labs reviewed. I have personally reviewed and have noted: 1) the patient's medical and social history 2) The patient's current medications and supplements 3) The patient's height, weight, and BMI have been recorded  in the chart   B12 deficiency Lab Results  Component Value Date   VITAMINB12 1,062 (H) 12/21/2022   Stable, cont oral replacement - b12 1000 mcg qd  CKD (chronic kidney disease) stage 3, GFR 30-59 ml/min (HCC) Lab Results  Component Value Date   CREATININE 1.33 12/21/2022   Stable overall, cont to avoid nephrotoxins   Diabetes ([place]) Lab Results  Component Value Date   HGBA1C 6.8 (H) 12/21/2022   Stable, pt to continue current medical treatment glucotorl xl 2.5 qd, metformin ER 50 - 4 in the am   Vitamin D deficiency Last vitamin D Lab Results  Component Value Date   VD25OH 38.61 12/21/2022   Low, to start oral replacement  Followup: Return in about 6 months (around 06/27/2023).  Cathlean Cower, MD 12/27/2022 9:10 PM [facility]

## 2022-12-27 NOTE — Assessment & Plan Note (Signed)
Last vitamin D Lab Results  Component Value Date   VD25OH 38.61 12/21/2022   Low, to start oral replacement

## 2022-12-27 NOTE — Assessment & Plan Note (Signed)
Age and sex appropriate education and counseling updated with regular exercise and diet Referrals for preventative services - none needed Immunizations addressed - decliens covid booster,but for shingrix at pharmacy Smoking counseling  - none needed Evidence for depression or other mood disorder - none significant Most recent labs reviewed. I have personally reviewed and have noted: 1) the patient's medical and social history 2) The patient's current medications and supplements 3) The patient's height, weight, and BMI have been recorded in the chart

## 2022-12-27 NOTE — Assessment & Plan Note (Signed)
Lab Results  Component Value Date   CREATININE 1.33 12/21/2022   Stable overall, cont to avoid nephrotoxins

## 2022-12-27 NOTE — Patient Instructions (Addendum)
Please remember to call to have your Shingles shot #2 at the pharmacy  Please continue all other medications as before, and refills have been done if requested.  Please have the pharmacy call with any other refills you may need.  Please continue your efforts at being more active, low cholesterol diet, and weight control.  You are otherwise up to date with prevention measures today.  Please keep your appointments with your specialists as you may have planned  Please make an Appointment to return in 6 months, or sooner if needed, also with Lab Appointment for testing done 3-5 days before at the [place] (so this is for TWO appointments - please see the scheduling desk as you leave)

## 2022-12-27 NOTE — Assessment & Plan Note (Signed)
Lab Results  Component Value Date   HGBA1C 6.8 (H) 12/21/2022   Stable, pt to continue current medical treatment glucotorl xl 2.5 qd, metformin ER 50 - 4 in the am

## 2023-02-07 DIAGNOSIS — M25562 Pain in left knee: Secondary | ICD-10-CM | POA: Diagnosis not present

## 2023-02-07 DIAGNOSIS — T8484XA Pain due to internal orthopedic prosthetic devices, implants and grafts, initial encounter: Secondary | ICD-10-CM | POA: Diagnosis not present

## 2023-02-07 DIAGNOSIS — T84093A Other mechanical complication of internal left knee prosthesis, initial encounter: Secondary | ICD-10-CM | POA: Diagnosis not present

## 2023-02-07 DIAGNOSIS — Z96652 Presence of left artificial knee joint: Secondary | ICD-10-CM | POA: Diagnosis not present

## 2023-02-15 ENCOUNTER — Other Ambulatory Visit: Payer: Self-pay | Admitting: Hematology

## 2023-02-15 DIAGNOSIS — E538 Deficiency of other specified B group vitamins: Secondary | ICD-10-CM

## 2023-02-23 ENCOUNTER — Other Ambulatory Visit (HOSPITAL_COMMUNITY): Payer: Self-pay | Admitting: Orthopedic Surgery

## 2023-02-23 DIAGNOSIS — Z96652 Presence of left artificial knee joint: Secondary | ICD-10-CM

## 2023-03-03 ENCOUNTER — Ambulatory Visit (HOSPITAL_COMMUNITY)
Admission: RE | Admit: 2023-03-03 | Discharge: 2023-03-03 | Disposition: A | Discharge status: Home / Self Care | Payer: PPO | Source: Ambulatory Visit | Attending: Orthopedic Surgery | Admitting: Orthopedic Surgery

## 2023-03-03 ENCOUNTER — Encounter (HOSPITAL_COMMUNITY)
Admission: RE | Admit: 2023-03-03 | Discharge: 2023-03-03 | Disposition: A | Discharge status: Home / Self Care | Payer: PPO | Source: Ambulatory Visit | Attending: Orthopedic Surgery | Admitting: Orthopedic Surgery

## 2023-03-03 DIAGNOSIS — Z96652 Presence of left artificial knee joint: Secondary | ICD-10-CM | POA: Diagnosis not present

## 2023-03-03 DIAGNOSIS — Z96653 Presence of artificial knee joint, bilateral: Secondary | ICD-10-CM | POA: Diagnosis not present

## 2023-03-03 MED ORDER — TECHNETIUM TC 99M MEDRONATE IV KIT
20.0000 | PACK | Freq: Once | INTRAVENOUS | Status: AC | PRN
  Administered 2023-03-03: 20.3 via INTRAVENOUS

## 2023-03-14 DIAGNOSIS — M25562 Pain in left knee: Secondary | ICD-10-CM | POA: Diagnosis not present

## 2023-03-14 DIAGNOSIS — Z96652 Presence of left artificial knee joint: Secondary | ICD-10-CM | POA: Diagnosis not present

## 2023-03-16 ENCOUNTER — Other Ambulatory Visit: Payer: HMO

## 2023-03-23 ENCOUNTER — Ambulatory Visit: Payer: PPO | Attending: Cardiology

## 2023-03-23 DIAGNOSIS — M25562 Pain in left knee: Secondary | ICD-10-CM | POA: Diagnosis not present

## 2023-03-23 DIAGNOSIS — I7141 Pararenal abdominal aortic aneurysm, without rupture: Secondary | ICD-10-CM

## 2023-03-23 DIAGNOSIS — T8484XA Pain due to internal orthopedic prosthetic devices, implants and grafts, initial encounter: Secondary | ICD-10-CM | POA: Diagnosis not present

## 2023-03-24 DIAGNOSIS — H524 Presbyopia: Secondary | ICD-10-CM | POA: Diagnosis not present

## 2023-03-24 DIAGNOSIS — H5213 Myopia, bilateral: Secondary | ICD-10-CM | POA: Diagnosis not present

## 2023-03-24 DIAGNOSIS — H35033 Hypertensive retinopathy, bilateral: Secondary | ICD-10-CM | POA: Diagnosis not present

## 2023-03-24 DIAGNOSIS — H35413 Lattice degeneration of retina, bilateral: Secondary | ICD-10-CM | POA: Diagnosis not present

## 2023-03-24 DIAGNOSIS — E119 Type 2 diabetes mellitus without complications: Secondary | ICD-10-CM | POA: Diagnosis not present

## 2023-03-24 DIAGNOSIS — H52223 Regular astigmatism, bilateral: Secondary | ICD-10-CM | POA: Diagnosis not present

## 2023-03-24 DIAGNOSIS — H2513 Age-related nuclear cataract, bilateral: Secondary | ICD-10-CM | POA: Diagnosis not present

## 2023-03-24 LAB — BASIC METABOLIC PANEL
BUN/Creatinine Ratio: 19 (ref 10–24)
BUN: 26 mg/dL (ref 8–27)
CO2: 22 mmol/L (ref 20–29)
Calcium: 9.4 mg/dL (ref 8.6–10.2)
Chloride: 100 mmol/L (ref 96–106)
Creatinine, Ser: 1.4 mg/dL — ABNORMAL HIGH (ref 0.76–1.27)
Glucose: 194 mg/dL — ABNORMAL HIGH (ref 70–99)
Potassium: 4.5 mmol/L (ref 3.5–5.2)
Sodium: 138 mmol/L (ref 134–144)
eGFR: 54 mL/min/{1.73_m2} — ABNORMAL LOW (ref >59)

## 2023-03-24 LAB — HM DIABETES EYE EXAM

## 2023-03-25 ENCOUNTER — Ambulatory Visit (HOSPITAL_BASED_OUTPATIENT_CLINIC_OR_DEPARTMENT_OTHER)
Admission: RE | Admit: 2023-03-25 | Discharge: 2023-03-25 | Disposition: A | Discharge status: Home / Self Care | Payer: PPO | Source: Ambulatory Visit | Attending: Cardiology | Admitting: Cardiology

## 2023-03-25 ENCOUNTER — Inpatient Hospital Stay: Admission: RE | Admit: 2023-03-25 | Payer: HMO | Source: Ambulatory Visit

## 2023-03-25 DIAGNOSIS — I7141 Pararenal abdominal aortic aneurysm, without rupture: Secondary | ICD-10-CM | POA: Diagnosis not present

## 2023-03-25 DIAGNOSIS — I7121 Aneurysm of the ascending aorta, without rupture: Secondary | ICD-10-CM | POA: Diagnosis not present

## 2023-03-25 MED ORDER — IOHEXOL 350 MG/ML SOLN
100.0000 mL | Freq: Once | INTRAVENOUS | Status: AC | PRN
  Administered 2023-03-25: 80 mL via INTRAVENOUS

## 2023-03-30 ENCOUNTER — Telehealth: Payer: Self-pay | Admitting: Cardiology

## 2023-03-30 DIAGNOSIS — I77819 Aortic ectasia, unspecified site: Secondary | ICD-10-CM

## 2023-03-30 DIAGNOSIS — I7141 Pararenal abdominal aortic aneurysm, without rupture: Secondary | ICD-10-CM

## 2023-03-30 DIAGNOSIS — I77810 Thoracic aortic ectasia: Secondary | ICD-10-CM

## 2023-03-30 NOTE — Telephone Encounter (Signed)
The patient has been notified of the result and verbalized understanding.  All questions (if any) were answered. Richelle Ito, RN 03/30/2023 3:01 PM   Pt aware of results and yearly echo and will be called to schedule. Echo ordered.

## 2023-03-30 NOTE — Telephone Encounter (Signed)
Patient returned RN's call. 

## 2023-03-30 NOTE — Telephone Encounter (Signed)
-----   Message from Loa Socks, LPN sent at 07/16/9146  1:34 PM EDT -----  ----- Message ----- From: Loa Socks, LPN Sent: 08/03/5620   4:51 AM EDT To: Loa Socks, LPN; Cv Div Ch St Triage   ----- Message ----- From: Meriam Sprague, MD Sent: 03/27/2023   2:56 PM EDT To: Loa Socks, LPN  His aortic root is stable in size. Can plan for annual monitoring with echoes.

## 2023-04-01 DIAGNOSIS — T849XXA Unspecified complication of internal orthopedic prosthetic device, implant and graft, initial encounter: Secondary | ICD-10-CM | POA: Insufficient documentation

## 2023-04-05 ENCOUNTER — Other Ambulatory Visit: Payer: Self-pay

## 2023-04-26 ENCOUNTER — Encounter: Payer: Self-pay | Admitting: Internal Medicine

## 2023-05-09 ENCOUNTER — Encounter: Payer: Self-pay | Admitting: Nurse Practitioner

## 2023-05-11 ENCOUNTER — Encounter (INDEPENDENT_AMBULATORY_CARE_PROVIDER_SITE_OTHER): Payer: PPO | Admitting: Ophthalmology

## 2023-05-11 DIAGNOSIS — H43813 Vitreous degeneration, bilateral: Secondary | ICD-10-CM | POA: Diagnosis not present

## 2023-05-11 DIAGNOSIS — I10 Essential (primary) hypertension: Secondary | ICD-10-CM | POA: Diagnosis not present

## 2023-05-11 DIAGNOSIS — H33301 Unspecified retinal break, right eye: Secondary | ICD-10-CM | POA: Diagnosis not present

## 2023-05-11 DIAGNOSIS — H35033 Hypertensive retinopathy, bilateral: Secondary | ICD-10-CM

## 2023-05-27 ENCOUNTER — Other Ambulatory Visit: Payer: Self-pay | Admitting: Interventional Radiology

## 2023-05-27 DIAGNOSIS — K625 Hemorrhage of anus and rectum: Secondary | ICD-10-CM

## 2023-05-30 NOTE — Progress Notes (Signed)
Reason for follow up:  Patient presents today via virtual telephone visit for follow up of rectal artery embolizations and right internal iliac artery embolization.   Referring physician: n/a  History of present illness: [name] is a 67 y.o. male with PMH of CAD, DM type II, DVT, HLD, HTN, OSA, PE on anticoagulation, gastric bypass, prostate cancer s/p XRT and renal insufficiency.  Patient is a Scientist, product/process development.   On 10/26/21 he had a prostate biopsy and on he presented to the ED 11/02/21 with complaints of acute rectal bleeding. There was CTA evidence of active extravasation and IR was consulted. On 11/02/21 he underwent a mesenteric angiogram with right inferior rectal gelfoam embolization. Post-procedure he had progressively worsening anemia and he was taken back to IR where I performed a mesenteric and pelvic angiogram with particle embolization of the superior rectal artery and gelfoam embolization of the anterior division of the right internal iliac artery.   He presents today via virtual telephone visit.....  Past Medical History:  Diagnosis Date   Arthritis    BACK PAIN    CAD (coronary artery disease) 06/28/2017   CAD in native artery    a. moderate by cardiac CT 2016.   Cancer [facility])    Chronic bilateral low back pain with bilateral sciatica 01/12/2019   Degenerative arthritis of right knee 07/15/2016   Injected in 07/15/2016 Orthovisc injection started 10/07/2016 DepoMedrol 40 inj on 04/11/17, 25 cc aspirated Orthovisc injection in 05/30/2017     DEGENERATIVE JOINT DISEASE, KNEES, BILATERAL    Depression    Diabetes (HCC) 10/01/2014   Diabetes mellitus    type II   DIVERTICULOSIS, COLON    DVT, lower extremity (HCC)    right   GERD (gastroesophageal reflux disease)    History of kidney stones    Hyperlipidemia    Hypertension    HYPOGONADISM, MALE    HYPOTENSION, ORTHOSTATIC    Kidney stones    Long term (current) use of anticoagulants 08/17/2017   Long  term current use of anticoagulant    Lumbosacral radiculopathy at S1 06/14/2012   Maxillary sinus cyst 12/11/2018   Obesity    OBESITY, MORBID 03/04/2008   Qualifier: Diagnosis of  By: Jonny Ruiz MD, Len Blalock    OSA (obstructive sleep apnea) 09/11/2013   Pulmonary emboli (HCC)    Pulmonary embolism (HCC) 02/05/2011   RBBB 07/28/2017   Renal insufficiency    a. prior h/o, does not appear chronic.   Superficial phlebitis of arm 03/18/2016   Vitamin D deficiency 07/09/2020    Past Surgical History:  Procedure Laterality Date   ankle surgury  2001 bilateral   with bone spurs and torn tendon   COLONOSCOPY  03/18/2010   EDG  2008   chronic Duodenitis   FLEXIBLE SIGMOIDOSCOPY N/A 11/03/2021   Procedure: FLEXIBLE SIGMOIDOSCOPY;  Surgeon: Sherrilyn Rist, MD;  Location: [facility];  Service: Gastroenterology;  Laterality: N/A;   GASTRIC BYPASS     HEEL SPUR SURGERY Bilateral 2005   IR ANGIOGRAM PELVIS SELECTIVE OR SUPRASELECTIVE  11/02/2021   IR ANGIOGRAM PELVIS SELECTIVE OR SUPRASELECTIVE  11/04/2021   IR ANGIOGRAM SELECTIVE EACH ADDITIONAL VESSEL  11/02/2021   IR ANGIOGRAM SELECTIVE EACH ADDITIONAL VESSEL  11/02/2021   IR ANGIOGRAM SELECTIVE EACH ADDITIONAL VESSEL  11/02/2021   IR ANGIOGRAM SELECTIVE EACH ADDITIONAL VESSEL  11/04/2021   IR ANGIOGRAM SELECTIVE EACH ADDITIONAL VESSEL  11/04/2021   IR ANGIOGRAM SELECTIVE EACH ADDITIONAL VESSEL  11/04/2021   IR  ANGIOGRAM SELECTIVE EACH ADDITIONAL VESSEL  11/04/2021   IR ANGIOGRAM VISCERAL SELECTIVE  11/02/2021   IR ANGIOGRAM VISCERAL SELECTIVE  11/04/2021   IR EMBO ART  VEN HEMORR LYMPH EXTRAV  INC GUIDE ROADMAPPING  11/02/2021   IR EMBO ART  VEN HEMORR LYMPH EXTRAV  INC GUIDE ROADMAPPING  11/04/2021   IR EMBO ART  VEN HEMORR LYMPH EXTRAV  INC GUIDE ROADMAPPING  11/04/2021   IR US GUIDE VASC ACCESS RIGHT  11/02/2021   IR US GUIDE VASC ACCESS RIGHT  11/04/2021   knee surgury  2000 & 2003    cartilage damage   LEFT HEART CATH AND  CORONARY ANGIOGRAPHY N/A 02/16/2021   Procedure: LEFT HEART CATH AND CORONARY ANGIOGRAPHY;  Surgeon: Tonny Bollman, MD;  Location: [facility] INVASIVE CV LAB;  Service: Cardiovascular;  Laterality: N/A;   LUMBAR LAMINECTOMY/DECOMPRESSION MICRODISCECTOMY  09/28/2012   Procedure: LUMBAR LAMINECTOMY/DECOMPRESSION MICRODISCECTOMY 2 LEVELS;  Surgeon: Drucilla Schmidt, MD;  Location: [facility];  Service: Orthopedics;  Laterality: Left;  Decompressive laminectomy L4-L5. Microdiscectomy L5-S1   Skin removal surgery     extra abdominal skin removed from his weight loss   TONSILLECTOMY AND ADENOIDECTOMY  age 58 or 6   TOTAL KNEE ARTHROPLASTY  01/10/2012   Procedure: TOTAL KNEE ARTHROPLASTY;  Surgeon: Shelda Pal, MD;  Location: [facility];  Service: Orthopedics;  Laterality: Left;   TOTAL KNEE ARTHROPLASTY Right 11/18/2020   Procedure: TOTAL KNEE ARTHROPLASTY;  Surgeon: Durene Romans, MD;  Location: [facility];  Service: Orthopedics;  Laterality: Right;  70 mins   UPPER GASTROINTESTINAL ENDOSCOPY  03/18/2010    Allergies: Adhesive [tape] and Other  Medications: Prior to Admission medications   Medication Sig Start Date End Date Taking? Authorizing Provider  Artificial Tear Ointment (ARTIFICIAL TEARS) ointment Place 1 drop into both eyes every 4 (four) hours as needed (dry eyes).     [provider]  atorvastatin (LIPITOR) 80 MG tablet TAKE 1 TABLET BY MOUTH EVERY DAY 11/12/22   Corwin Levins, MD  chlorthalidone (HYGROTON) 25 MG tablet TAKE 1 TABLET BY MOUTH EVERY DAY 10/26/22   Corwin Levins, MD  Cholecalciferol (VITAMIN D3) 125 MCG (5000 UT) CAPS Take 5,000 Units by mouth daily.     [provider]  ferrous sulfate 325 (65 FE) MG EC tablet Take 1 tablet by mouth daily.    [provider]  ferrous sulfate 325 (65 FE) MG EC tablet TAKE 1 TABLET BY MOUTH EVERY DAY 02/15/23   Malachy Mood, MD  folic acid (FOLVITE) 1 MG tablet TAKE 1 TABLET BY MOUTH EVERY DAY 02/15/23   Malachy Mood, MD  glipiZIDE  (GLUCOTROL XL) 2.5 MG 24 hr tablet TAKE 1 TABLET BY MOUTH EVERY DAY WITH BREAKFAST 11/12/22   Corwin Levins, MD  glucose blood [facility] ULTRA) test strip Use as instructed 05/25/22   Corwin Levins, MD  Lidocaine-Hydrocort, Perianal, 3-0.5 % CREA Use as directed three times per day as needed 03/26/22   Corwin Levins, MD  metFORMIN (GLUCOPHAGE) 500 MG tablet TAKE 4 TABLETS BY MOUTH DAILY WITH BREAKFAST. 11/12/22   Corwin Levins, MD  mirtazapine (REMERON) 15 MG tablet TAKE 1 TABLET BY MOUTH EVERYDAY AT BEDTIME 11/30/22   Iva Boop, MD  Multiple Vitamin (MULTIVITAMIN WITH MINERALS) TABS tablet Take 1 tablet by mouth daily at 12 noon.    [provider]  nitroGLYCERIN (NITROSTAT) 0.4 MG SL tablet Place 1 tablet (0.4 mg total) under the tongue every 5 (five) minutes  as needed for chest pain. 07/30/15   Lars Masson, MD  omeprazole (PRILOSEC) 40 MG capsule TAKE 1 CAP BY MOUTH DAILY. OPEN CAPSULE AND SWALLOW CONTENTS IN LIQUID OR APPLESAUCE    [provider]  ondansetron (ZOFRAN) 4 MG tablet Take 1 tablet (4 mg total) by mouth every 8 (eight) hours as needed for nausea or vomiting. 06/02/22   Unk Lightning, PA  vitamin B-12 1000 MCG tablet Take 1 tablet (1,000 mcg total) by mouth daily. 11/08/21   Kathlen Mody, MD     Family History  Problem Relation Age of Onset   Hypertension Mother    Prostate cancer Father    Diabetes Sister        died with DM Jun 30, 2001, 2 more sisters with diabetes   Heart failure Sister    Diabetes Brother    Diabetes Sister    Diabetes Sister    Colon cancer Neg Hx    Colon polyps Neg Hx    Esophageal cancer Neg Hx    Rectal cancer Neg Hx    Stomach cancer Neg Hx     Social History   Socioeconomic History   Marital status: Married    Spouse name: Not on file   Number of children: 2   Years of education: Not on file   Highest education level: High school graduate  Occupational History   Occupation: Retired    Associate Professor: Training and development officer   Tobacco Use   Smoking status: Former    Packs/day: 0.25    Years: 5.00    Additional pack years: 0.00    Total pack years: 1.25    Types: Cigarettes    Quit date: 12/06/1978    Years since quitting: 44.5   Smokeless tobacco: Never  Vaping Use   Vaping Use: Never used  Substance and Sexual Activity   Alcohol use: Yes    Alcohol/week: 2.0 standard drinks of alcohol    Types: 2 Standard drinks or equivalent per week   Drug use: No   Sexual activity: Not Currently  Other Topics Concern   Not on file  Social History Narrative   Daily caffeine use one per day   Protein drinks    Children; a friend and sister that helps   Brother that is around [place])   Son and dtr in [place]   Lives w/ wife in [place]   Left handed     Retired from [employer]   Former smoker 2 alcoholic beverages daily no caffeine no drug use no other tobacco at this time   Social Determinants of Corporate investment banker Strain: Low Risk  (10/07/2020)   Overall Financial Resource Strain (CARDIA)    Difficulty of Paying Living Expenses: Not hard at all  Food Insecurity: No Food Insecurity (10/07/2020)   Hunger Vital Sign    Worried About Running Out of Food in the Last Year: Never true    Ran Out of Food in the Last Year: Never true  Transportation Needs: No Transportation Needs (10/07/2020)   PRAPARE - Administrator, Civil Service (Medical): No    Lack of Transportation (Non-Medical): No  Physical Activity: Sufficiently Active (10/07/2020)   Exercise Vital Sign    Days of Exercise per Week: 5 days    Minutes of Exercise per Session: 30 min  Stress: No Stress Concern Present (10/07/2020)   [employer] of Occupational Health - Occupational Stress Questionnaire    Feeling of Stress : Not at all  Social Connections: Socially Integrated (10/07/2020)   Social Connection and Isolation Panel [NHANES]    Frequency of Communication with Friends and Family: More than three times a week     Frequency of Social Gatherings with Friends and Family: More than three times a week    Attends Religious Services: More than 4 times per year    Active Member of [employer] or Organizations: Yes    Attends Engineer, structural: More than 4 times per year    Marital Status: Married     Vital Signs: There were no vitals taken for this visit.  No physical exam was performed in lieu of virtual visit.   Imaging: No results found.  Labs:  CBC: Recent Labs    07/23/22 0912 10/26/22 1415 12/21/22 1049  WBC 5.8 6.3 5.6  HGB 12.3* 11.8* 13.0  HCT 37.6* 37.7* 41.4  PLT 202 172 186.0    COAGS: No results for input(s): "INR", "APTT" in the last 8760 hours.  BMP: Recent Labs    12/21/22 1049 03/23/23 1019  NA 140 138  K 4.8 4.5  CL 102 100  CO2 29 22  GLUCOSE 146* 194*  BUN 25* 26  CALCIUM 9.7 9.4  CREATININE 1.33 1.40*    LIVER FUNCTION TESTS: Recent Labs    12/21/22 1049  BILITOT 0.6  AST 22  ALT 35  ALKPHOS 86  PROT 6.9  ALBUMIN 4.6    Assessment and Plan:  67 year old male with a history of prostate cancer and GI bleed following prostate biopsy. He underwent mesenteric angiograms with rectal artery embolizations and right internal iliac artery embolization on 11/02/21 and 11/04/21.   Electronically Signed: Mickie Kay 05/30/2023, 2:59 PM   I spent a total of 25 Minutes in face to face in clinical consultation, greater than 50% of which was counseling/coordinating care for GI bleed with multiple vessel embolizations.

## 2023-06-01 ENCOUNTER — Ambulatory Visit
Admission: RE | Admit: 2023-06-01 | Discharge: 2023-06-01 | Disposition: A | Discharge status: Home / Self Care | Payer: PPO | Source: Ambulatory Visit | Attending: Interventional Radiology | Admitting: Interventional Radiology

## 2023-06-01 ENCOUNTER — Ambulatory Visit (INDEPENDENT_AMBULATORY_CARE_PROVIDER_SITE_OTHER): Payer: PPO

## 2023-06-01 VITALS — Ht 74.0 in | Wt 240.0 lb

## 2023-06-01 DIAGNOSIS — Z0000 Encounter for general adult medical examination without abnormal findings: Secondary | ICD-10-CM

## 2023-06-01 DIAGNOSIS — L219 Seborrheic dermatitis, unspecified: Secondary | ICD-10-CM | POA: Diagnosis not present

## 2023-06-01 DIAGNOSIS — K625 Hemorrhage of anus and rectum: Secondary | ICD-10-CM | POA: Diagnosis not present

## 2023-06-01 DIAGNOSIS — L299 Pruritus, unspecified: Secondary | ICD-10-CM | POA: Diagnosis not present

## 2023-06-01 HISTORY — PX: IR RADIOLOGIST EVAL & MGMT: IMG5224

## 2023-06-01 NOTE — Progress Notes (Addendum)
Subjective:   [name] is a 67 y.o. male who presents for Medicare Annual/Subsequent preventive examination.  Visit Complete: Virtual  I connected with  [name] on 06/01/23 by a audio enabled telemedicine application and verified that I am speaking with the correct person using two identifiers.  Patient Location: Home  Provider Location: Office/Clinic  I discussed the limitations of evaluation and management by telemedicine. The patient expressed understanding and agreed to proceed.  Patient Medicare AWV questionnaire was completed by the patient on 05/31/2023; I have confirmed that all information answered by patient is correct and no changes since this date.  Review of Systems     Cardiac Risk Factors include: advanced age (>8men, >66 women);diabetes mellitus;dyslipidemia;family history of premature cardiovascular disease;hypertension;male gender;obesity (BMI >30kg/m2)     Objective:    Today's Vitals   06/01/23 1134  Weight: 240 lb (108.9 kg)  Height: 6\' 2"  (1.88 m)  PainSc: 0-No pain   Body mass index is 30.81 kg/m.     06/01/2023   11:38 AM 04/14/2022   10:38 AM 01/05/2022    8:49 AM 11/19/2021    7:14 PM 11/02/2021    2:57 AM 02/16/2021    6:27 AM 11/18/2020    3:20 PM  Advanced Directives  Does Patient Have a Medical Advance Directive? No No No No No No No  Does patient want to make changes to medical advance directive?  No - Patient declined    No - Patient declined   Would patient like information on creating a medical advance directive? No - Patient declined   No - Patient declined No - Patient declined No - Patient declined No - Patient declined    Current Medications (verified) Outpatient Encounter Medications as of 06/01/2023  Medication Sig   Artificial Tear Ointment (ARTIFICIAL TEARS) ointment Place 1 drop into both eyes every 4 (four) hours as needed (dry eyes).    atorvastatin (LIPITOR) 80 MG tablet TAKE 1 TABLET BY MOUTH EVERY DAY    chlorthalidone (HYGROTON) 25 MG tablet TAKE 1 TABLET BY MOUTH EVERY DAY   Cholecalciferol (VITAMIN D3) 125 MCG (5000 UT) CAPS Take 5,000 Units by mouth daily.    ferrous sulfate 325 (65 FE) MG EC tablet Take 1 tablet by mouth daily.   ferrous sulfate 325 (65 FE) MG EC tablet TAKE 1 TABLET BY MOUTH EVERY DAY   folic acid (FOLVITE) 1 MG tablet TAKE 1 TABLET BY MOUTH EVERY DAY   glipiZIDE (GLUCOTROL XL) 2.5 MG 24 hr tablet TAKE 1 TABLET BY MOUTH EVERY DAY WITH BREAKFAST   glucose blood (ONETOUCH ULTRA) test strip Use as instructed   Lidocaine-Hydrocort, Perianal, 3-0.5 % CREA Use as directed three times per day as needed   metFORMIN (GLUCOPHAGE) 500 MG tablet TAKE 4 TABLETS BY MOUTH DAILY WITH BREAKFAST.   mirtazapine (REMERON) 15 MG tablet TAKE 1 TABLET BY MOUTH EVERYDAY AT BEDTIME   Multiple Vitamin (MULTIVITAMIN WITH MINERALS) TABS tablet Take 1 tablet by mouth daily at 12 noon.   nitroGLYCERIN (NITROSTAT) 0.4 MG SL tablet Place 1 tablet (0.4 mg total) under the tongue every 5 (five) minutes as needed for chest pain.   omeprazole (PRILOSEC) 40 MG capsule TAKE 1 CAP BY MOUTH DAILY. OPEN CAPSULE AND SWALLOW CONTENTS IN LIQUID OR APPLESAUCE   ondansetron (ZOFRAN) 4 MG tablet Take 1 tablet (4 mg total) by mouth every 8 (eight) hours as needed for nausea or vomiting.   vitamin B-12 1000 MCG tablet Take 1 tablet (  1,000 mcg total) by mouth daily.   No facility-administered encounter medications on file as of 06/01/2023.    Allergies (verified) Adhesive [tape] and Other   History: Past Medical History:  Diagnosis Date   Arthritis    BACK PAIN    CAD (coronary artery disease) 06/28/2017   CAD in native artery    a. moderate by cardiac CT 2016.   Cancer [facility])    Chronic bilateral low back pain with bilateral sciatica 01/12/2019   Degenerative arthritis of right knee 07/15/2016   Injected in 07/15/2016 Orthovisc injection started 10/07/2016 DepoMedrol 40 inj on 04/11/17, 25 cc aspirated  Orthovisc injection in 05/30/2017     DEGENERATIVE JOINT DISEASE, KNEES, BILATERAL    Depression    Diabetes (HCC) 10/01/2014   Diabetes mellitus    type II   DIVERTICULOSIS, COLON    DVT, lower extremity (HCC)    right   GERD (gastroesophageal reflux disease)    History of kidney stones    Hyperlipidemia    Hypertension    HYPOGONADISM, MALE    HYPOTENSION, ORTHOSTATIC    Kidney stones    Long term (current) use of anticoagulants 08/17/2017   Long term current use of anticoagulant    Lumbosacral radiculopathy at S1 06/14/2012   Maxillary sinus cyst 12/11/2018   Obesity    OBESITY, MORBID 03/04/2008   Qualifier: Diagnosis of  By: Jonny Ruiz MD, Len Blalock    OSA (obstructive sleep apnea) 09/11/2013   Pulmonary emboli (HCC)    Pulmonary embolism (HCC) 02/05/2011   RBBB 07/28/2017   Renal insufficiency    a. prior h/o, does not appear chronic.   Superficial phlebitis of arm 03/18/2016   Vitamin D deficiency 07/09/2020   Past Surgical History:  Procedure Laterality Date   ankle surgury  2001 bilateral   with bone spurs and torn tendon   COLONOSCOPY  03/18/2010   EDG  2008   chronic Duodenitis   FLEXIBLE SIGMOIDOSCOPY N/A 11/03/2021   Procedure: FLEXIBLE SIGMOIDOSCOPY;  Surgeon: Sherrilyn Rist, MD;  Location: [facility];  Service: Gastroenterology;  Laterality: N/A;   GASTRIC BYPASS     HEEL SPUR SURGERY Bilateral 2005   IR ANGIOGRAM PELVIS SELECTIVE OR SUPRASELECTIVE  11/02/2021   IR ANGIOGRAM PELVIS SELECTIVE OR SUPRASELECTIVE  11/04/2021   IR ANGIOGRAM SELECTIVE EACH ADDITIONAL VESSEL  11/02/2021   IR ANGIOGRAM SELECTIVE EACH ADDITIONAL VESSEL  11/02/2021   IR ANGIOGRAM SELECTIVE EACH ADDITIONAL VESSEL  11/02/2021   IR ANGIOGRAM SELECTIVE EACH ADDITIONAL VESSEL  11/04/2021   IR ANGIOGRAM SELECTIVE EACH ADDITIONAL VESSEL  11/04/2021   IR ANGIOGRAM SELECTIVE EACH ADDITIONAL VESSEL  11/04/2021   IR ANGIOGRAM SELECTIVE EACH ADDITIONAL VESSEL  11/04/2021   IR ANGIOGRAM  VISCERAL SELECTIVE  11/02/2021   IR ANGIOGRAM VISCERAL SELECTIVE  11/04/2021   IR EMBO ART  VEN HEMORR LYMPH EXTRAV  INC GUIDE ROADMAPPING  11/02/2021   IR EMBO ART  VEN HEMORR LYMPH EXTRAV  INC GUIDE ROADMAPPING  11/04/2021   IR EMBO ART  VEN HEMORR LYMPH EXTRAV  INC GUIDE ROADMAPPING  11/04/2021   IR US GUIDE VASC ACCESS RIGHT  11/02/2021   IR US GUIDE VASC ACCESS RIGHT  11/04/2021   knee surgury  2000 & 2003    cartilage damage   LEFT HEART CATH AND CORONARY ANGIOGRAPHY N/A 02/16/2021   Procedure: LEFT HEART CATH AND CORONARY ANGIOGRAPHY;  Surgeon: Tonny Bollman, MD;  [facility]: [facility] INVASIVE CV LAB;  Service: Cardiovascular;  Laterality: N/A;   LUMBAR  LAMINECTOMY/DECOMPRESSION MICRODISCECTOMY  09/28/2012   Procedure: LUMBAR LAMINECTOMY/DECOMPRESSION MICRODISCECTOMY 2 LEVELS;  Surgeon: Drucilla Schmidt, MD;  Location: [facility];  Service: Orthopedics;  Laterality: Left;  Decompressive laminectomy L4-L5. Microdiscectomy L5-S1   Skin removal surgery     extra abdominal skin removed from his weight loss   TONSILLECTOMY AND ADENOIDECTOMY  age 38 or 6   TOTAL KNEE ARTHROPLASTY  01/10/2012   Procedure: TOTAL KNEE ARTHROPLASTY;  Surgeon: Shelda Pal, MD;  Location: [facility];  Service: Orthopedics;  Laterality: Left;   TOTAL KNEE ARTHROPLASTY Right 11/18/2020   Procedure: TOTAL KNEE ARTHROPLASTY;  Surgeon: Durene Romans, MD;  Location: [facility];  Service: Orthopedics;  Laterality: Right;  70 mins   UPPER GASTROINTESTINAL ENDOSCOPY  03/18/2010   Family History  Problem Relation Age of Onset   Hypertension Mother    Prostate cancer Father    Diabetes Sister        died with DM 2001/06/15, 2 more sisters with diabetes   Heart failure Sister    Diabetes Brother    Diabetes Sister    Diabetes Sister    Colon cancer Neg Hx    Colon polyps Neg Hx    Esophageal cancer Neg Hx    Rectal cancer Neg Hx    Stomach cancer Neg Hx    Social History   Socioeconomic History   Marital status: Married    Spouse  name: Not on file   Number of children: 2   Years of education: Not on file   Highest education level: High school graduate  Occupational History   Occupation: Retired    Associate Professor: Training and development officer  Tobacco Use   Smoking status: Former    Packs/day: 0.25    Years: 5.00    Additional pack years: 0.00    Total pack years: 1.25    Types: Cigarettes    Quit date: 12/06/1978    Years since quitting: 44.5   Smokeless tobacco: Never  Vaping Use   Vaping Use: Never used  Substance and Sexual Activity   Alcohol use: Yes    Alcohol/week: 2.0 standard drinks of alcohol    Types: 2 Standard drinks or equivalent per week   Drug use: No   Sexual activity: Not Currently  Other Topics Concern   Not on file  Social History Narrative   Daily caffeine use one per day   Protein drinks    Children; a friend and sister that helps   Brother that is around [place] Need[place])   Son and dtr in [place]   Lives w/ wife in [place]   Left handed     Retired from [employer]   Former smoker 2 alcoholic beverages daily no caffeine no drug use no other tobacco at this time   Social Determinants of Corporate investment banker Strain: Medium Risk (06/01/2023)   Overall Financial Resource Strain (CARDIA)    Difficulty of Paying Living Expenses: Somewhat hard  Food Insecurity: No Food Insecurity (06/01/2023)   Hunger Vital Sign    Worried About Running Out of Food in the Last Year: Never true    Ran Out of Food in the Last Year: Never true  Transportation Needs: No Transportation Needs (06/01/2023)   PRAPARE - Administrator, Civil Service (Medical): No    Lack of Transportation (Non-Medical): No  Physical Activity: Sufficiently Active (06/01/2023)   Exercise Vital Sign    Days of Exercise per Week: 3 days    Minutes of Exercise per Session:  70 min  Stress: No Stress Concern Present (06/01/2023)   [employer] of Occupational Health - Occupational Stress Questionnaire    Feeling of Stress : Not at all   Social Connections: Unknown (06/01/2023)   Social Connection and Isolation Panel [NHANES]    Frequency of Communication with Friends and Family: Three times a week    Frequency of Social Gatherings with Friends and Family: Once a week    Attends Religious Services: Not on Marketing executive or Organizations: Yes    Attends Engineer, structural: More than 4 times per year    Marital Status: Married    Tobacco Counseling Counseling given: Not Answered   Clinical Intake:  Pre-visit preparation completed: Yes  Pain : No/denies pain Pain Score: 0-No pain     BMI - recorded: 30.81 Nutritional Status: BMI > 30  Obese Nutritional Risks: None Diabetes: Yes CBG done?: No Did pt. bring in CBG monitor from home?: No  How often do you need to have someone help you when you read instructions, pamphlets, or other written materials from your doctor or pharmacy?: 1 - Never What is the last grade level you completed in school?: HSG  Interpreter Needed?: No  Information entered by :: Shivam Mestas N. Jozi Malachi, LPN.   Activities of Daily Living    06/01/2023   11:38 AM 05/31/2023    5:58 PM  In your present state of health, do you have any difficulty performing the following activities:  Hearing? 0 0  Vision? 0 0  Difficulty concentrating or making decisions? 0 0  Walking or climbing stairs? 0 0  Dressing or bathing? 0 0  Doing errands, shopping? 0 0  Preparing Food and eating ? N N  Using the Toilet? N N  In the past six months, have you accidently leaked urine? Y Y  Do you have problems with loss of bowel control? N N  Managing your Medications? N N  Managing your Finances? N N  Housekeeping or managing your Housekeeping? N N    Patient Care Team: Corwin Levins, MD as PCP - General Delton See Faustino Congress, MD as PCP - Cardiology (Cardiology) Sheran Luz, MD as Consulting Physician (Physical Medicine and Rehabilitation) Delrae Alfred, MD as Referring  Physician (Surgical Oncology) Romero Belling, MD (Inactive) as Consulting Physician (Endocrinology) Nelson Chimes, MD as Consulting Physician (Ophthalmology)  Indicate any recent Medical Services you may have received from other than [facility] providers in the past year (date may be approximate).     Assessment:   This is a routine wellness examination for [name].  Hearing/Vision screen Hearing Screening - Comments:: Denies hearing difficulties   Vision Screening - Comments:: Up to date with routine eye exams with [facility]   Dietary issues and exercise activities discussed:     Goals Addressed             This Visit's Progress    CCM:  Monitor and Maintain HbA1c <8%        Depression Screen    06/01/2023   11:36 AM 12/27/2022    3:21 PM 12/27/2022    2:57 PM 06/25/2022    2:38 PM 03/26/2022    1:58 PM 09/14/2021    1:27 PM 10/07/2020    1:53 PM  PHQ 2/9 Scores  PHQ - 2 Score 0 0 0 0 0 0 0  PHQ- 9 Score 0   4     Exception Documentation    Patient  refusal       Fall Risk    06/01/2023   11:38 AM 05/31/2023    5:58 PM 12/27/2022    3:21 PM 12/27/2022    2:57 PM 06/25/2022    2:37 PM  Fall Risk   Falls in the past year? 0 0 0 0 0  Number falls in past yr: 0 0 0 0 0  Injury with Fall? 0 0 0 0 0  Risk for fall due to : No Fall Risks   No Fall Risks No Fall Risks  Follow up Falls prevention discussed   Falls evaluation completed     MEDICARE RISK AT HOME:  Medicare Risk at Home - 06/01/23 1139     Any stairs in or around the home? Yes    If so, are there any without handrails? No    Home free of loose throw rugs in walkways, pet beds, electrical cords, etc? Yes    Adequate lighting in your home to reduce risk of falls? Yes    Life alert? No    Use of a cane, walker or w/c? No    Grab bars in the bathroom? Yes    Shower chair or bench in shower? No    Elevated toilet seat or a handicapped toilet? No             TIMED UP AND GO:  Was the test  performed?  No    Cognitive Function:    09/23/2017    3:24 PM  MMSE - Mini Mental State Exam  Orientation to time 5  Orientation to Place 5  Registration 3  Attention/ Calculation 5  Recall 2  Language- name 2 objects 2  Language- repeat 1  Language- follow 3 step command 3  Language- read & follow direction 1  Write a sentence 1  Copy design 1  Total score 29        06/01/2023   11:39 AM 10/07/2020    1:57 PM  6CIT Screen  What Year? 0 points 0 points  What month? 0 points 0 points  What time? 0 points 0 points  Count back from 20 0 points 0 points  Months in reverse 0 points 0 points  Repeat phrase 0 points 0 points  Total Score 0 points 0 points    Immunizations Immunization History  Administered Date(s) Administered   COVID-19, mRNA, vaccine(Comirnaty)12 years and older 05/09/2023   Fluad Quad(high Dose 65+) 10/05/2019, 10/07/2020, 09/14/2021, 08/30/2022   Influenza Split 08/19/2011, 10/30/2012   Influenza Whole 09/21/2005, 09/02/2008, 12/24/2009   Influenza,inj,Quad PF,6+ Mos 09/13/2013, 09/30/2014, 10/29/2015, 11/15/2016, 09/23/2017, 09/19/2018   PFIZER Comirnaty(Gray Top)Covid-19 Tri-Sucrose Vaccine 07/09/2021, 08/30/2022   PFIZER(Purple Top)SARS-COV-2 Vaccination 01/03/2020, 01/27/2020, 10/31/2020   Pneumococcal Conjugate-13 03/13/2014   Pneumococcal Polysaccharide-23 03/04/2008, 06/28/2016, 11/28/2018   Td 03/11/2009   Td (Adult), 2 Lf Tetanus Toxid, Preservative Free 03/11/2009   Tdap 07/13/2019   Zoster Recombinat (Shingrix) 08/30/2022   Zoster, Live 10/29/2015    TDAP status: Up to date  Flu Vaccine status: Up to date  Pneumococcal vaccine status: Up to date  Covid-19 vaccine status: Completed vaccines  Qualifies for Shingles Vaccine? Yes   Zostavax completed Yes   Shingrix Completed?: Yes  Screening Tests Health Maintenance  Topic Date Due   HEMOGLOBIN A1C  06/21/2023   COVID-19 Vaccine (7 - 2023-24 season) 07/04/2023   INFLUENZA  VACCINE  07/07/2023   Pneumonia Vaccine 68+ Years old (3 of 3 - PPSV23 or PCV20) 11/29/2023  Diabetic kidney evaluation - Urine ACR  12/22/2023   FOOT EXAM  12/28/2023   Diabetic kidney evaluation - eGFR measurement  03/22/2024   OPHTHALMOLOGY EXAM  03/23/2024   Medicare Annual Wellness (AWV)  05/31/2024   DTaP/Tdap/Td (3 - Td or Tdap) 07/12/2029   Colonoscopy  08/06/2030   Hepatitis C Screening  Completed   Zoster Vaccines- Shingrix  Completed   HPV VACCINES  Aged Out    Health Maintenance  There are no preventive care reminders to display for this patient.   Colorectal cancer screening: Type of screening: Colonoscopy. Completed 11/22/2022. Repeat every 10 years  Lung Cancer Screening: (Low Dose CT Chest recommended if Age 16-80 years, 20 pack-year currently smoking OR have quit w/in 15years.) does not qualify.   Lung Cancer Screening Referral: no  Additional Screening:  Hepatitis C Screening: does qualify; Completed 12/22/2015  Vision Screening: Recommended annual ophthalmology exams for early detection of glaucoma and other disorders of the eye. Is the patient up to date with their annual eye exam?  Yes  Who is the provider or what is the name of the office in which the patient attends annual eye exams? [facility] If pt is not established with a provider, would they like to be referred to a provider to establish care? No .   Dental Screening: Recommended annual dental exams for proper oral hygiene  Diabetic Foot Exam: Diabetic Foot Exam: Completed 12/27/2022  Community Resource Referral / Chronic Care Management: CRR required this visit?  No   CCM required this visit?  No     Plan:     I have personally reviewed and noted the following in the patient's chart:   Medical and social history Use of alcohol, tobacco or illicit drugs  Current medications and supplements including opioid prescriptions. Patient is not currently taking opioid  prescriptions. Functional ability and status Nutritional status Physical activity Advanced directives List of other physicians Hospitalizations, surgeries, and ER visits in previous 12 months Vitals Screenings to include cognitive, depression, and falls Referrals and appointments  In addition, I have reviewed and discussed with patient certain preventive protocols, quality metrics, and best practice recommendations. A written personalized care plan for preventive services as well as general preventive health recommendations were provided to patient.     Mickeal Needy, LPN   6/96/2952   After Visit Summary: (Mail) Due to this being a telephonic visit, the after visit summary with patients personalized plan was offered to patient via mail   Nurse Notes: Normal cognitive status assessed by direct observation via telephone conversation by this Nurse Health Advisor. No abnormalities found.

## 2023-06-01 NOTE — Patient Instructions (Addendum)
[name] , Thank you for taking time to come for your Medicare Wellness Visit. I appreciate your ongoing commitment to your health goals. Please review the following plan we discussed and let me know if I can assist you in the future.   These are the goals we discussed:  Goals      CCM:  Monitor and Maintain HbA1c <8%        This is a list of the screening recommended for you and due dates:  Health Maintenance  Topic Date Due   Hemoglobin A1C  06/21/2023   COVID-19 Vaccine (7 - 2023-24 season) 07/04/2023   Flu Shot  07/07/2023   Pneumonia Vaccine (3 of 3 - PPSV23 or PCV20) 11/29/2023   Yearly kidney health urinalysis for diabetes  12/22/2023   Complete foot exam   12/28/2023   Yearly kidney function blood test for diabetes  03/22/2024   Eye exam for diabetics  03/23/2024   Medicare Annual Wellness Visit  05/31/2024   DTaP/Tdap/Td vaccine (3 - Td or Tdap) 07/12/2029   Colon Cancer Screening  08/06/2030   Hepatitis C Screening  Completed   Zoster (Shingles) Vaccine  Completed   HPV Vaccine  Aged Out    Advanced directives: No  Conditions/risks identified: Yes  Next appointment: It was nice speaking with you today!  Please follow up in one year for your annual wellness visit via telephone call with Nurse Percell Miller on 06/06/2024 at 2:30 p.m.  If you need to cancel or reschedule please call [phone].  Preventive Care 10 Years and Older, Male  Preventive care refers to lifestyle choices and visits with your health care provider that can promote health and wellness. What does preventive care include? A yearly physical exam. This is also called an annual well check. Dental exams once or twice a year. Routine eye exams. Ask your health care provider how often you should have your eyes checked. Personal lifestyle choices, including: Daily care of your teeth and gums. Regular physical activity. Eating a healthy diet. Avoiding tobacco and drug use. Limiting alcohol  use. Practicing safe sex. Taking low doses of aspirin every day. Taking vitamin and mineral supplements as recommended by your health care provider. What happens during an annual well check? The services and screenings done by your health care provider during your annual well check will depend on your age, overall health, lifestyle risk factors, and family history of disease. Counseling  Your health care provider may ask you questions about your: Alcohol use. Tobacco use. Drug use. Emotional well-being. Home and relationship well-being. Sexual activity. Eating habits. History of falls. Memory and ability to understand (cognition). Work and work Astronomer. Screening  You may have the following tests or measurements: Height, weight, and BMI. Blood pressure. Lipid and cholesterol levels. These may be checked every 5 years, or more frequently if you are over 10 years old. Skin check. Lung cancer screening. You may have this screening every year starting at age 40 if you have a 30-pack-year history of smoking and currently smoke or have quit within the past 15 years. Fecal occult blood test (FOBT) of the stool. You may have this test every year starting at age 86. Flexible sigmoidoscopy or colonoscopy. You may have a sigmoidoscopy every 5 years or a colonoscopy every 10 years starting at age 54. Prostate cancer screening. Recommendations will vary depending on your family history and other risks. Hepatitis C blood test. Hepatitis B blood test. Sexually transmitted disease (STD) testing. Diabetes screening.  This is done by checking your blood sugar (glucose) after you have not eaten for a while (fasting). You may have this done every 1-3 years. Abdominal aortic aneurysm (AAA) screening. You may need this if you are a current or former smoker. Osteoporosis. You may be screened starting at age 31 if you are at high risk. Talk with your health care provider about your test results,  treatment options, and if necessary, the need for more tests. Vaccines  Your health care provider may recommend certain vaccines, such as: Influenza vaccine. This is recommended every year. Tetanus, diphtheria, and acellular pertussis (Tdap, Td) vaccine. You may need a Td booster every 10 years. Zoster vaccine. You may need this after age 5. Pneumococcal 13-valent conjugate (PCV13) vaccine. One dose is recommended after age 1. Pneumococcal polysaccharide (PPSV23) vaccine. One dose is recommended after age 49. Talk to your health care provider about which screenings and vaccines you need and how often you need them. This information is not intended to replace advice given to you by your health care provider. Make sure you discuss any questions you have with your health care provider. Document Released: 12/19/2015 Document Revised: 08/11/2016 Document Reviewed: 09/23/2015 Elsevier Interactive Patient Education  2017 [place] Prevention in the Home Falls can cause injuries. They can happen to people of all ages. There are many things you can do to make your home safe and to help prevent falls. What can I do on the outside of my home? Regularly fix the edges of walkways and driveways and fix any cracks. Remove anything that might make you trip as you walk through a door, such as a raised step or threshold. Trim any bushes or trees on the path to your home. Use bright outdoor lighting. Clear any walking paths of anything that might make someone trip, such as rocks or tools. Regularly check to see if handrails are loose or broken. Make sure that both sides of any steps have handrails. Any raised decks and porches should have guardrails on the edges. Have any leaves, snow, or ice cleared regularly. Use sand or salt on walking paths during winter. Clean up any spills in your garage right away. This includes oil or grease spills. What can I do in the bathroom? Use night  lights. Install grab bars by the toilet and in the tub and shower. Do not use towel bars as grab bars. Use non-skid mats or decals in the tub or shower. If you need to sit down in the shower, use a plastic, non-slip stool. Keep the floor dry. Clean up any water that spills on the floor as soon as it happens. Remove soap buildup in the tub or shower regularly. Attach bath mats securely with double-sided non-slip rug tape. Do not have throw rugs and other things on the floor that can make you trip. What can I do in the bedroom? Use night lights. Make sure that you have a light by your bed that is easy to reach. Do not use any sheets or blankets that are too big for your bed. They should not hang down onto the floor. Have a firm chair that has side arms. You can use this for support while you get dressed. Do not have throw rugs and other things on the floor that can make you trip. What can I do in the kitchen? Clean up any spills right away. Avoid walking on wet floors. Keep items that you use a lot in easy-to-reach places. If  you need to reach something above you, use a strong step stool that has a grab bar. Keep electrical cords out of the way. Do not use floor polish or wax that makes floors slippery. If you must use wax, use non-skid floor wax. Do not have throw rugs and other things on the floor that can make you trip. What can I do with my stairs? Do not leave any items on the stairs. Make sure that there are handrails on both sides of the stairs and use them. Fix handrails that are broken or loose. Make sure that handrails are as long as the stairways. Check any carpeting to make sure that it is firmly attached to the stairs. Fix any carpet that is loose or worn. Avoid having throw rugs at the top or bottom of the stairs. If you do have throw rugs, attach them to the floor with carpet tape. Make sure that you have a light switch at the top of the stairs and the bottom of the stairs. If  you do not have them, ask someone to add them for you. What else can I do to help prevent falls? Wear shoes that: Do not have high heels. Have rubber bottoms. Are comfortable and fit you well. Are closed at the toe. Do not wear sandals. If you use a stepladder: Make sure that it is fully opened. Do not climb a closed stepladder. Make sure that both sides of the stepladder are locked into place. Ask someone to hold it for you, if possible. Clearly mark and make sure that you can see: Any grab bars or handrails. First and last steps. Where the edge of each step is. Use tools that help you move around (mobility aids) if they are needed. These include: Canes. Walkers. Scooters. Crutches. Turn on the lights when you go into a dark area. Replace any light bulbs as soon as they burn out. Set up your furniture so you have a clear path. Avoid moving your furniture around. If any of your floors are uneven, fix them. If there are any pets around you, be aware of where they are. Review your medicines with your doctor. Some medicines can make you feel dizzy. This can increase your chance of falling. Ask your doctor what other things that you can do to help prevent falls. This information is not intended to replace advice given to you by your health care provider. Make sure you discuss any questions you have with your health care provider. Document Released: 09/18/2009 Document Revised: 04/29/2016 Document Reviewed: 12/27/2014 Elsevier Interactive Patient Education  2017 [employer].

## 2023-06-14 DIAGNOSIS — Z8546 Personal history of malignant neoplasm of prostate: Secondary | ICD-10-CM | POA: Diagnosis not present

## 2023-06-21 DIAGNOSIS — C61 Malignant neoplasm of prostate: Secondary | ICD-10-CM | POA: Diagnosis not present

## 2023-06-22 ENCOUNTER — Other Ambulatory Visit (INDEPENDENT_AMBULATORY_CARE_PROVIDER_SITE_OTHER): Payer: PPO

## 2023-06-22 DIAGNOSIS — R972 Elevated prostate specific antigen [PSA]: Secondary | ICD-10-CM

## 2023-06-22 DIAGNOSIS — Z7901 Long term (current) use of anticoagulants: Secondary | ICD-10-CM

## 2023-06-22 DIAGNOSIS — E1122 Type 2 diabetes mellitus with diabetic chronic kidney disease: Secondary | ICD-10-CM

## 2023-06-22 DIAGNOSIS — E538 Deficiency of other specified B group vitamins: Secondary | ICD-10-CM

## 2023-06-22 DIAGNOSIS — E559 Vitamin D deficiency, unspecified: Secondary | ICD-10-CM

## 2023-06-22 DIAGNOSIS — N183 Chronic kidney disease, stage 3 unspecified: Secondary | ICD-10-CM

## 2023-06-22 LAB — CBC WITH DIFFERENTIAL/PLATELET
Basophils Absolute: 0 10*3/uL (ref 0.0–0.1)
Basophils Relative: 0.5 % (ref 0.0–3.0)
Eosinophils Absolute: 0.1 10*3/uL (ref 0.0–0.7)
Eosinophils Relative: 1.7 % (ref 0.0–5.0)
HCT: 40.5 % (ref 39.0–52.0)
Hemoglobin: 12.5 g/dL — ABNORMAL LOW (ref 13.0–17.0)
Lymphocytes Relative: 27.7 % (ref 12.0–46.0)
Lymphs Abs: 1.6 10*3/uL (ref 0.7–4.0)
MCHC: 30.9 g/dL (ref 30.0–36.0)
MCV: 73.1 fl — ABNORMAL LOW (ref 78.0–100.0)
Monocytes Absolute: 0.5 10*3/uL (ref 0.1–1.0)
Monocytes Relative: 9.4 % (ref 3.0–12.0)
Neutro Abs: 3.5 10*3/uL (ref 1.4–7.7)
Neutrophils Relative %: 60.7 % (ref 43.0–77.0)
Platelets: 176 10*3/uL (ref 150.0–400.0)
RBC: 5.54 Mil/uL (ref 4.22–5.81)
RDW: 15.3 % (ref 11.5–15.5)
WBC: 5.8 10*3/uL (ref 4.0–10.5)

## 2023-06-22 LAB — LIPID PANEL
Cholesterol: 120 mg/dL (ref 0–200)
HDL: 50.8 mg/dL (ref >39.00)
LDL Cholesterol: 58 mg/dL (ref 0–99)
NonHDL: 69.22
Total CHOL/HDL Ratio: 2
Triglycerides: 55 mg/dL (ref 0.0–149.0)
VLDL: 11 mg/dL (ref 0.0–40.0)

## 2023-06-22 LAB — PSA: PSA: 1.61 ng/mL (ref 0.10–4.00)

## 2023-06-22 LAB — HEPATIC FUNCTION PANEL
ALT: 29 U/L (ref 0–53)
AST: 22 U/L (ref 0–37)
Albumin: 4.7 g/dL (ref 3.5–5.2)
Alkaline Phosphatase: 77 U/L (ref 39–117)
Bilirubin, Direct: 0.2 mg/dL (ref 0.0–0.3)
Total Bilirubin: 0.8 mg/dL (ref 0.2–1.2)
Total Protein: 7.1 g/dL (ref 6.0–8.3)

## 2023-06-22 LAB — PROTIME-INR
INR: 1.2 ratio — ABNORMAL HIGH (ref 0.8–1.0)
Prothrombin Time: 12.6 s (ref 9.6–13.1)

## 2023-06-22 LAB — BASIC METABOLIC PANEL
BUN: 37 mg/dL — ABNORMAL HIGH (ref 6–23)
CO2: 27 mEq/L (ref 19–32)
Calcium: 9.8 mg/dL (ref 8.4–10.5)
Chloride: 102 mEq/L (ref 96–112)
Creatinine, Ser: 1.42 mg/dL (ref 0.40–1.50)
GFR: 50.42 mL/min — ABNORMAL LOW (ref >60.00)
Glucose, Bld: 116 mg/dL — ABNORMAL HIGH (ref 70–99)
Potassium: 4.8 mEq/L (ref 3.5–5.1)
Sodium: 140 mEq/L (ref 135–145)

## 2023-06-22 LAB — MICROALBUMIN / CREATININE URINE RATIO
Creatinine,U: 92.1 mg/dL
Microalb Creat Ratio: 0.8 mg/g (ref 0.0–30.0)
Microalb, Ur: <0.7 mg/dL (ref 0.0–1.9)

## 2023-06-22 LAB — URINALYSIS, ROUTINE W REFLEX MICROSCOPIC
Bilirubin Urine: NEGATIVE
Hgb urine dipstick: NEGATIVE
Ketones, ur: NEGATIVE
Leukocytes,Ua: NEGATIVE
Nitrite: NEGATIVE
RBC / HPF: NONE SEEN (ref 0–?)
Specific Gravity, Urine: 1.025 (ref 1.000–1.030)
Total Protein, Urine: NEGATIVE
Urine Glucose: NEGATIVE
Urobilinogen, UA: 0.2 (ref 0.0–1.0)
pH: 6 (ref 5.0–8.0)

## 2023-06-22 LAB — HEMOGLOBIN A1C: Hgb A1c MFr Bld: 7.1 % — ABNORMAL HIGH (ref 4.6–6.5)

## 2023-06-22 LAB — TSH: TSH: 2.62 u[IU]/mL (ref 0.35–5.50)

## 2023-06-22 LAB — VITAMIN B12: Vitamin B-12: 929 pg/mL — ABNORMAL HIGH (ref 211–911)

## 2023-06-22 LAB — VITAMIN D 25 HYDROXY (VIT D DEFICIENCY, FRACTURES): VITD: 42.09 ng/mL (ref 30.00–100.00)

## 2023-06-25 ENCOUNTER — Emergency Department (HOSPITAL_COMMUNITY): Payer: PPO

## 2023-06-25 ENCOUNTER — Other Ambulatory Visit: Payer: Self-pay

## 2023-06-25 ENCOUNTER — Encounter (HOSPITAL_COMMUNITY): Payer: Self-pay

## 2023-06-25 ENCOUNTER — Emergency Department (HOSPITAL_COMMUNITY)
Admission: EM | Admit: 2023-06-25 | Discharge: 2023-06-25 | Disposition: A | Discharge status: Home / Self Care | Payer: PPO | Attending: Emergency Medicine | Admitting: Emergency Medicine

## 2023-06-25 DIAGNOSIS — R109 Unspecified abdominal pain: Secondary | ICD-10-CM

## 2023-06-25 DIAGNOSIS — R110 Nausea: Secondary | ICD-10-CM | POA: Diagnosis not present

## 2023-06-25 DIAGNOSIS — R079 Chest pain, unspecified: Secondary | ICD-10-CM | POA: Diagnosis not present

## 2023-06-25 DIAGNOSIS — R1011 Right upper quadrant pain: Secondary | ICD-10-CM | POA: Insufficient documentation

## 2023-06-25 DIAGNOSIS — E860 Dehydration: Secondary | ICD-10-CM | POA: Insufficient documentation

## 2023-06-25 DIAGNOSIS — I77810 Thoracic aortic ectasia: Secondary | ICD-10-CM | POA: Diagnosis not present

## 2023-06-25 DIAGNOSIS — I700 Atherosclerosis of aorta: Secondary | ICD-10-CM | POA: Diagnosis not present

## 2023-06-25 DIAGNOSIS — R1032 Left lower quadrant pain: Secondary | ICD-10-CM | POA: Diagnosis not present

## 2023-06-25 DIAGNOSIS — R001 Bradycardia, unspecified: Secondary | ICD-10-CM | POA: Diagnosis not present

## 2023-06-25 LAB — CBC
HCT: 40.8 % (ref 39.0–52.0)
Hemoglobin: 12.4 g/dL — ABNORMAL LOW (ref 13.0–17.0)
MCH: 22.3 pg — ABNORMAL LOW (ref 26.0–34.0)
MCHC: 30.4 g/dL (ref 30.0–36.0)
MCV: 73.4 fL — ABNORMAL LOW (ref 80.0–100.0)
Platelets: 158 10*3/uL (ref 150–400)
RBC: 5.56 MIL/uL (ref 4.22–5.81)
RDW: 15.7 % — ABNORMAL HIGH (ref 11.5–15.5)
WBC: 8.5 10*3/uL (ref 4.0–10.5)
nRBC: 0 % (ref 0.0–0.2)

## 2023-06-25 LAB — URINALYSIS, ROUTINE W REFLEX MICROSCOPIC
Bilirubin Urine: NEGATIVE
Glucose, UA: NEGATIVE mg/dL
Hgb urine dipstick: NEGATIVE
Ketones, ur: 5 mg/dL — AB
Leukocytes,Ua: NEGATIVE
Nitrite: NEGATIVE
Protein, ur: NEGATIVE mg/dL
Specific Gravity, Urine: 1.016 (ref 1.005–1.030)
pH: 6 (ref 5.0–8.0)

## 2023-06-25 LAB — COMPREHENSIVE METABOLIC PANEL
ALT: 32 U/L (ref 0–44)
AST: 30 U/L (ref 15–41)
Albumin: 4.5 g/dL (ref 3.5–5.0)
Alkaline Phosphatase: 76 U/L (ref 38–126)
Anion gap: 10 (ref 5–15)
BUN: 29 mg/dL — ABNORMAL HIGH (ref 8–23)
CO2: 24 mmol/L (ref 22–32)
Calcium: 9.3 mg/dL (ref 8.9–10.3)
Chloride: 106 mmol/L (ref 98–111)
Creatinine, Ser: 1.31 mg/dL — ABNORMAL HIGH (ref 0.61–1.24)
GFR, Estimated: 59 mL/min — ABNORMAL LOW (ref >60)
Glucose, Bld: 120 mg/dL — ABNORMAL HIGH (ref 70–99)
Potassium: 5 mmol/L (ref 3.5–5.1)
Sodium: 140 mmol/L (ref 135–145)
Total Bilirubin: 1 mg/dL (ref 0.3–1.2)
Total Protein: 7.7 g/dL (ref 6.5–8.1)

## 2023-06-25 LAB — TROPONIN I (HIGH SENSITIVITY): Troponin I (High Sensitivity): 3 ng/L (ref <18)

## 2023-06-25 LAB — LIPASE, BLOOD: Lipase: 34 U/L (ref 11–51)

## 2023-06-25 MED ORDER — MORPHINE SULFATE (PF) 4 MG/ML IV SOLN
4.0000 mg | Freq: Once | INTRAVENOUS | Status: DC
  Filled 2023-06-25: qty 1

## 2023-06-25 MED ORDER — FENTANYL CITRATE PF 50 MCG/ML IJ SOSY
50.0000 ug | PREFILLED_SYRINGE | Freq: Once | INTRAMUSCULAR | Status: AC
  Administered 2023-06-25: 50 ug via INTRAVENOUS
  Filled 2023-06-25: qty 1

## 2023-06-25 MED ORDER — SODIUM CHLORIDE 0.9 % IV BOLUS
500.0000 mL | Freq: Once | INTRAVENOUS | Status: AC
  Administered 2023-06-25: 500 mL via INTRAVENOUS

## 2023-06-25 MED ORDER — IOHEXOL 350 MG/ML SOLN
100.0000 mL | Freq: Once | INTRAVENOUS | Status: AC | PRN
  Administered 2023-06-25: 100 mL via INTRAVENOUS

## 2023-06-25 NOTE — ED Notes (Signed)
Pt was a difficult IV stick. 3 RN's attempted to obtain IV access without success. Blood specimens were drawn on first IV attempt and sent to lab. Dr. Wilkie Aye notified. Dr. Wilkie Aye inserted a 20G ultrasound guided IV in the patients left upper arm at 2200

## 2023-06-25 NOTE — ED Triage Notes (Signed)
Pt. Arrives POV c/o generalized abdominal pain that he states started at around 4 pm. Pt. States that the pain is also shooting up through his esophagus. Tenderness on palpation to the LUQ and LLQ. Pt has hx of diverticulitis. denies NVD.

## 2023-06-25 NOTE — ED Provider Notes (Signed)
EMERGENCY DEPARTMENT AT [facility] Provider Note   CSN: [id number] Arrival date & time: 06/25/23  1921     History  Chief Complaint  Patient presents with   Abdominal Pain    [name] is a 67 y.o. male.  HPI   67 year old male with past medical history of diverticulitis presents emergency department complaining of mid and left abdominal pain.  Patient states that this started around 4 PM.  Initially was severe, sharp shooting, caused him to be doubled over.  This was associated with nausea and decreased appetite but no vomiting or diarrhea.  Pain is now mainly located on the left side of the abdomen.  He denies any fever but endorses chills and fatigue.  No genitourinary symptoms.  No history of AAA.  Home Medications Prior to Admission medications   Medication Sig Start Date End Date Taking? Authorizing Provider  Artificial Tear Ointment (ARTIFICIAL TEARS) ointment Place 1 drop into both eyes every 4 (four) hours as needed (dry eyes).     [provider]  atorvastatin (LIPITOR) 80 MG tablet TAKE 1 TABLET BY MOUTH EVERY DAY 11/12/22   Corwin Levins, MD  chlorthalidone (HYGROTON) 25 MG tablet TAKE 1 TABLET BY MOUTH EVERY DAY 10/26/22   Corwin Levins, MD  Cholecalciferol (VITAMIN D3) 125 MCG (5000 UT) CAPS Take 5,000 Units by mouth daily.     [provider]  ferrous sulfate 325 (65 FE) MG EC tablet Take 1 tablet by mouth daily.    [provider]  ferrous sulfate 325 (65 FE) MG EC tablet TAKE 1 TABLET BY MOUTH EVERY DAY 02/15/23   Malachy Mood, MD  folic acid (FOLVITE) 1 MG tablet TAKE 1 TABLET BY MOUTH EVERY DAY 02/15/23   Malachy Mood, MD  glipiZIDE (GLUCOTROL XL) 2.5 MG 24 hr tablet TAKE 1 TABLET BY MOUTH EVERY DAY WITH BREAKFAST 11/12/22   Corwin Levins, MD  glucose blood [facility] ULTRA) test strip Use as instructed 05/25/22   Corwin Levins, MD  Lidocaine-Hydrocort, Perianal, 3-0.5 % CREA Use as directed three times per day as needed  03/26/22   Corwin Levins, MD  metFORMIN (GLUCOPHAGE) 500 MG tablet TAKE 4 TABLETS BY MOUTH DAILY WITH BREAKFAST. 11/12/22   Corwin Levins, MD  mirtazapine (REMERON) 15 MG tablet TAKE 1 TABLET BY MOUTH EVERYDAY AT BEDTIME 11/30/22   Iva Boop, MD  Multiple Vitamin (MULTIVITAMIN WITH MINERALS) TABS tablet Take 1 tablet by mouth daily at 12 noon.    [provider]  nitroGLYCERIN (NITROSTAT) 0.4 MG SL tablet Place 1 tablet (0.4 mg total) under the tongue every 5 (five) minutes as needed for chest pain. 07/30/15   Lars Masson, MD  omeprazole (PRILOSEC) 40 MG capsule TAKE 1 CAP BY MOUTH DAILY. OPEN CAPSULE AND SWALLOW CONTENTS IN LIQUID OR APPLESAUCE    [provider]  ondansetron (ZOFRAN) 4 MG tablet Take 1 tablet (4 mg total) by mouth every 8 (eight) hours as needed for nausea or vomiting. 06/02/22   Unk Lightning, PA  vitamin B-12 1000 MCG tablet Take 1 tablet (1,000 mcg total) by mouth daily. 11/08/21   Kathlen Mody, MD      Allergies    Adhesive [tape] and Other    Review of Systems   Review of Systems  Constitutional:  Positive for appetite change and fatigue. Negative for fever.  Respiratory:  Negative for chest tightness and shortness of breath.   Cardiovascular:  Positive for chest pain.  Gastrointestinal:  Positive for abdominal pain and nausea. Negative for abdominal distention, blood in stool, constipation, diarrhea and vomiting.  Genitourinary:  Negative for flank pain.  Musculoskeletal:  Negative for back pain.  Skin:  Negative for rash.  Neurological:  Negative for headaches.    Physical Exam Updated Vital Signs BP 136/75   Pulse (!) 51   Temp 97.8 F (36.6 C) (Oral)   Resp 11   Ht 6\' 2"  (1.88 m)   Wt 113.4 kg   SpO2 98%   BMI 32.10 kg/m  Physical Exam Vitals and nursing note reviewed.  Constitutional:      General: He is not in acute distress.    Appearance: Normal appearance.  HENT:     Head: Normocephalic.     Mouth/Throat:      Mouth: Mucous membranes are moist.  Cardiovascular:     Rate and Rhythm: Normal rate.  Pulmonary:     Effort: Pulmonary effort is normal. No respiratory distress.  Abdominal:     Palpations: Abdomen is soft.     Tenderness: There is abdominal tenderness in the left upper quadrant and left lower quadrant. There is no guarding or rebound.     Hernia: No hernia is present.  Skin:    General: Skin is warm.  Neurological:     Mental Status: He is alert and oriented to person, place, and time. Mental status is at baseline.  Psychiatric:        Mood and Affect: Mood normal.     ED Results / Procedures / Treatments   Labs (all labs ordered are listed, but only abnormal results are displayed) Labs Reviewed  COMPREHENSIVE METABOLIC PANEL - Abnormal; Notable for the following components:      Result Value   Glucose, Bld 120 (*)    BUN 29 (*)    Creatinine, Ser 1.31 (*)    GFR, Estimated 59 (*)    All other components within normal limits  CBC - Abnormal; Notable for the following components:   Hemoglobin 12.4 (*)    MCV 73.4 (*)    MCH 22.3 (*)    RDW 15.7 (*)    All other components within normal limits  URINALYSIS, ROUTINE W REFLEX MICROSCOPIC - Abnormal; Notable for the following components:   Ketones, ur 5 (*)    All other components within normal limits  LIPASE, BLOOD  MAGNESIUM  TROPONIN I (HIGH SENSITIVITY)    EKG EKG Interpretation Date/Time:  Saturday June 25 2023 19:33:38 EDT Ventricular Rate:  53 PR Interval:  181 QRS Duration:  140 QT Interval:  444 QTC Calculation: 417 R Axis:   89  Text Interpretation: Sinus rhythm Right bundle branch block Confirmed by Coralee Pesa ([id number]) on 06/25/2023 9:09:29 PM  Radiology No results found.  Procedures Ultrasound ED Peripheral IV (Provider)  Date/Time: 06/25/2023 10:58 PM  Performed by: Rozelle Logan, DO Authorized by: Rozelle Logan, DO   Procedure details:    Indications: multiple failed IV  attempts and poor IV access     Skin Prep: isopropyl alcohol     Location:  Left AC   Angiocath:  20 G   Bedside Ultrasound Guided: Yes     Images: not archived     Patient tolerated procedure without complications: Yes     Dressing applied: Yes       Medications Ordered in ED Medications  sodium chloride 0.9 % bolus 500 mL (500 mLs Intravenous New Bag/Given  06/25/23 2214)  fentaNYL (SUBLIMAZE) injection 50 mcg (50 mcg Intravenous Given 06/25/23 2208)  iohexol (OMNIPAQUE) 350 MG/ML injection 100 mL (100 mLs Intravenous Contrast Given 06/25/23 2231)    ED Course/ Medical Decision Making/ A&P                             Medical Decision Making Amount and/or Complexity of Data Reviewed Labs: ordered. Radiology: ordered.  Risk Prescription drug management.   67 year old male presents emergency department with sudden onset mid abdominal pain that was originally severe, currently is mild.  He has some mild left-sided abdominal tenderness but otherwise benign exam.  He is bradycardic on arrival, stable blood pressure, afebrile.  He is not on a beta-blocker.  Has had sinus bradycardia in the past.  EKG shows sinus bradycardia.  Blood work shows mild dehydration. Otherwise is baseline. UA negative. Chart review shows that he has been evaluated for a mild ascending thoracic aneurysm in the past.  Given the sudden onset of pain we will do a CTA dissection study.  CTA imaging was negative for any acute findings outside of mild constipation.  After IV medication patient feels improved and back to baseline.  Patient at this time appears safe and stable for discharge and close outpatient follow up. Discharge plan and strict return to ED precautions discussed, patient verbalizes understanding and agreement.        Final Clinical Impression(s) / ED Diagnoses Final diagnoses:  None    Rx / DC Orders ED Discharge Orders     None         Rozelle Logan, DO 06/25/23 2344

## 2023-06-25 NOTE — ED Notes (Signed)
Pt is aware that a urine sample is needed. Urinal at bedside.

## 2023-06-25 NOTE — Discharge Instructions (Addendum)
You have been seen and discharged from the emergency department.  Your blood work showed mild dehydration.  Your heart workup was normal.  The CT of your chest abdomen pelvis identified mild constipation but no other acute finding.  You may try MiraLAX, Dulcolax or over-the-counter medications for stool softening.  Follow-up with your primary provider for further evaluation and further care. Take home medications as prescribed. If you have any worsening symptoms or further concerns for your health please return to an emergency department for further evaluation.

## 2023-06-26 LAB — MAGNESIUM: Magnesium: 2.1 mg/dL (ref 1.7–2.4)

## 2023-06-27 ENCOUNTER — Encounter: Payer: Self-pay | Admitting: Internal Medicine

## 2023-06-27 ENCOUNTER — Ambulatory Visit (INDEPENDENT_AMBULATORY_CARE_PROVIDER_SITE_OTHER): Payer: PPO | Admitting: Internal Medicine

## 2023-06-27 VITALS — BP 128/78 | HR 50 | Temp 98.7°F | Ht 74.0 in | Wt 256.0 lb

## 2023-06-27 DIAGNOSIS — J309 Allergic rhinitis, unspecified: Secondary | ICD-10-CM | POA: Diagnosis not present

## 2023-06-27 DIAGNOSIS — E538 Deficiency of other specified B group vitamins: Secondary | ICD-10-CM

## 2023-06-27 DIAGNOSIS — E559 Vitamin D deficiency, unspecified: Secondary | ICD-10-CM

## 2023-06-27 DIAGNOSIS — Z125 Encounter for screening for malignant neoplasm of prostate: Secondary | ICD-10-CM | POA: Diagnosis not present

## 2023-06-27 DIAGNOSIS — N1831 Chronic kidney disease, stage 3a: Secondary | ICD-10-CM | POA: Diagnosis not present

## 2023-06-27 DIAGNOSIS — E782 Mixed hyperlipidemia: Secondary | ICD-10-CM

## 2023-06-27 DIAGNOSIS — Z0001 Encounter for general adult medical examination with abnormal findings: Secondary | ICD-10-CM

## 2023-06-27 DIAGNOSIS — E1122 Type 2 diabetes mellitus with diabetic chronic kidney disease: Secondary | ICD-10-CM

## 2023-06-27 DIAGNOSIS — N183 Chronic kidney disease, stage 3 unspecified: Secondary | ICD-10-CM

## 2023-06-27 NOTE — Assessment & Plan Note (Signed)
Lab Results  Component Value Date   HGBA1C 7.1 (H) 06/22/2023   Uncontrolled, goal A1c < 7 , pt to continue current medical treatment glucotrol xl 2.5 mg every day, metformin er 500 mg - 4 every day as o/w decline change today

## 2023-06-27 NOTE — Assessment & Plan Note (Signed)
Last vitamin D Lab Results  Component Value Date   VD25OH 42.09 06/22/2023   Stable, cont oral replacement

## 2023-06-27 NOTE — Progress Notes (Signed)
Patient ID: [name], male   DOB: 11/25/1956, 67 y.o.   MRN: [id number]         Chief Complaint:: wellness exam and  , constipation, hld with atherosclerosis, allergies, ckd3a, dm, low vit d       HPI:  [name] is a 67 y.o. male here for wellness exam; up to date                        Also Pt denies chest pain, increased sob or doe, wheezing, orthopnea, PND, increased LE swelling, palpitations, dizziness or syncope.   Pt denies polydipsia, polyuria, or new focal neuro s/s.    Pt denies fever, wt loss, night sweats, loss of appetite, or other constitutional symptoms  .Does have several wks ongoing nasal allergy symptoms with clearish congestion, itch and sneezing, without fever, pain, ST, cough, swelling or wheezing.  Denies worsening reflux, abd pain, dysphagia, n/v,  or blood except for episode constipation and pain seen at ED recenty , now resolved.     Wt Readings from Last 3 Encounters:  06/27/23 256 lb (116.1 kg)  06/25/23 250 lb (113.4 kg)  06/01/23 240 lb (108.9 kg)   BP Readings from Last 3 Encounters:  06/27/23 128/78  06/25/23 137/75  12/27/22 108/68   Immunization History  Administered Date(s) Administered   COVID-19, mRNA, vaccine(Comirnaty)12 years and older 05/09/2023   Fluad Quad(high Dose 65+) 10/05/2019, 10/07/2020, 09/14/2021, 08/30/2022   Influenza Split 08/19/2011, 10/30/2012, 09/06/2016   Influenza Whole 09/21/2005, 09/02/2008, 12/24/2009   Influenza,inj,Quad PF,6+ Mos 09/13/2013, 09/30/2014, 10/29/2015, 11/15/2016, 09/23/2017, 09/19/2018   PFIZER Comirnaty(Gray Top)Covid-19 Tri-Sucrose Vaccine 07/09/2021, 08/30/2022   PFIZER(Purple Top)SARS-COV-2 Vaccination 01/03/2020, 01/27/2020, 10/31/2020   Pneumococcal Conjugate-13 03/13/2014   Pneumococcal Polysaccharide-23 03/04/2008, 06/28/2016, 11/28/2018   Td 03/11/2009   Td (Adult), 2 Lf Tetanus Toxid, Preservative Free 03/11/2009   Tdap 07/13/2019   Zoster Recombinant(Shingrix) 08/30/2022, 05/09/2023    Zoster, Live 10/29/2015  There are no preventive care reminders to display for this patient.    Past Medical History:  Diagnosis Date   Arthritis    BACK PAIN    CAD (coronary artery disease) 06/28/2017   CAD in native artery    a. moderate by cardiac CT 2016.   Cancer [facility])    Chronic bilateral low back pain with bilateral sciatica 01/12/2019   Degenerative arthritis of right knee 07/15/2016   Injected in 07/15/2016 Orthovisc injection started 10/07/2016 DepoMedrol 40 inj on 04/11/17, 25 cc aspirated Orthovisc injection in 05/30/2017     DEGENERATIVE JOINT DISEASE, KNEES, BILATERAL    Depression    Diabetes (HCC) 10/01/2014   Diabetes mellitus    type II   DIVERTICULOSIS, COLON    DVT, lower extremity (HCC)    right   GERD (gastroesophageal reflux disease)    History of kidney stones    Hyperlipidemia    Hypertension    HYPOGONADISM, MALE    HYPOTENSION, ORTHOSTATIC    Kidney stones    Long term (current) use of anticoagulants 08/17/2017   Long term current use of anticoagulant    Lumbosacral radiculopathy at S1 06/14/2012   Maxillary sinus cyst 12/11/2018   Obesity    OBESITY, MORBID 03/04/2008   Qualifier: Diagnosis of  By: Jonny Ruiz MD, Len Blalock    OSA (obstructive sleep apnea) 09/11/2013   Pulmonary emboli (HCC)    Pulmonary embolism (HCC) 02/05/2011   RBBB 07/28/2017   Renal insufficiency    a. prior h/o,  does not appear chronic.   Superficial phlebitis of arm 03/18/2016   Vitamin D deficiency 07/09/2020   Past Surgical History:  Procedure Laterality Date   ankle surgury  2001 bilateral   with bone spurs and torn tendon   COLONOSCOPY  03/18/2010   EDG  2008   chronic Duodenitis   FLEXIBLE SIGMOIDOSCOPY N/A 11/03/2021   Procedure: FLEXIBLE SIGMOIDOSCOPY;  Surgeon: Sherrilyn Rist, MD;  Location: [facility];  Service: Gastroenterology;  Laterality: N/A;   GASTRIC BYPASS     HEEL SPUR SURGERY Bilateral 2005   IR ANGIOGRAM PELVIS SELECTIVE OR  SUPRASELECTIVE  11/02/2021   IR ANGIOGRAM PELVIS SELECTIVE OR SUPRASELECTIVE  11/04/2021   IR ANGIOGRAM SELECTIVE EACH ADDITIONAL VESSEL  11/02/2021   IR ANGIOGRAM SELECTIVE EACH ADDITIONAL VESSEL  11/02/2021   IR ANGIOGRAM SELECTIVE EACH ADDITIONAL VESSEL  11/02/2021   IR ANGIOGRAM SELECTIVE EACH ADDITIONAL VESSEL  11/04/2021   IR ANGIOGRAM SELECTIVE EACH ADDITIONAL VESSEL  11/04/2021   IR ANGIOGRAM SELECTIVE EACH ADDITIONAL VESSEL  11/04/2021   IR ANGIOGRAM SELECTIVE EACH ADDITIONAL VESSEL  11/04/2021   IR ANGIOGRAM VISCERAL SELECTIVE  11/02/2021   IR ANGIOGRAM VISCERAL SELECTIVE  11/04/2021   IR EMBO ART  VEN HEMORR LYMPH EXTRAV  INC GUIDE ROADMAPPING  11/02/2021   IR EMBO ART  VEN HEMORR LYMPH EXTRAV  INC GUIDE ROADMAPPING  11/04/2021   IR EMBO ART  VEN HEMORR LYMPH EXTRAV  INC GUIDE ROADMAPPING  11/04/2021   IR RADIOLOGIST EVAL & MGMT  06/01/2023   IR US GUIDE VASC ACCESS RIGHT  11/02/2021   IR US GUIDE VASC ACCESS RIGHT  11/04/2021   knee surgury  2000 & 2003    cartilage damage   LEFT HEART CATH AND CORONARY ANGIOGRAPHY N/A 02/16/2021   Procedure: LEFT HEART CATH AND CORONARY ANGIOGRAPHY;  Surgeon: Tonny Bollman, MD;  Location: [facility] INVASIVE CV LAB;  Service: Cardiovascular;  Laterality: N/A;   LUMBAR LAMINECTOMY/DECOMPRESSION MICRODISCECTOMY  09/28/2012   Procedure: LUMBAR LAMINECTOMY/DECOMPRESSION MICRODISCECTOMY 2 LEVELS;  Surgeon: Drucilla Schmidt, MD;  Location: [facility];  Service: Orthopedics;  Laterality: Left;  Decompressive laminectomy L4-L5. Microdiscectomy L5-S1   Skin removal surgery     extra abdominal skin removed from his weight loss   TONSILLECTOMY AND ADENOIDECTOMY  age 59 or 6   TOTAL KNEE ARTHROPLASTY  01/10/2012   Procedure: TOTAL KNEE ARTHROPLASTY;  Surgeon: Shelda Pal, MD;  Location: [facility];  Service: Orthopedics;  Laterality: Left;   TOTAL KNEE ARTHROPLASTY Right 11/18/2020   Procedure: TOTAL KNEE ARTHROPLASTY;  Surgeon: Durene Romans, MD;  Location: [facility];   Service: Orthopedics;  Laterality: Right;  70 mins   UPPER GASTROINTESTINAL ENDOSCOPY  03/18/2010    reports that he quit smoking about 44 years ago. His smoking use included cigarettes. He started smoking about 49 years ago. He has a 1.3 pack-year smoking history. He has never used smokeless tobacco. He reports current alcohol use of about 2.0 standard drinks of alcohol per week. He reports that he does not use drugs. family history includes Diabetes in his brother, sister, sister, and sister; Heart failure in his sister; Hypertension in his mother; Prostate cancer in his father. Allergies  Allergen Reactions   Adhesive [Tape] Other (See Comments)    Painful, Please use "paper" tape   Other     NO BLOOD -JEHOVAH'S WITNESS-SIGNED REFUSAL BUT WOULD TAKE ALBUMIN   Current Outpatient Medications on File Prior to Visit  Medication Sig Dispense Refill   Artificial Tear Ointment (  ARTIFICIAL TEARS) ointment Place 1 drop into both eyes every 4 (four) hours as needed (dry eyes).      atorvastatin (LIPITOR) 80 MG tablet TAKE 1 TABLET BY MOUTH EVERY DAY 90 tablet 2   chlorthalidone (HYGROTON) 25 MG tablet TAKE 1 TABLET BY MOUTH EVERY DAY 90 tablet 3   Cholecalciferol (VITAMIN D3) 125 MCG (5000 UT) CAPS Take 5,000 Units by mouth daily.      ferrous sulfate 325 (65 FE) MG EC tablet Take 1 tablet by mouth daily.     ferrous sulfate 325 (65 FE) MG EC tablet TAKE 1 TABLET BY MOUTH EVERY DAY 90 tablet 1   folic acid (FOLVITE) 1 MG tablet TAKE 1 TABLET BY MOUTH EVERY DAY 90 tablet 1   glipiZIDE (GLUCOTROL XL) 2.5 MG 24 hr tablet TAKE 1 TABLET BY MOUTH EVERY DAY WITH BREAKFAST 90 tablet 3   glucose blood (ONETOUCH ULTRA) test strip Use as instructed 100 each 12   Lidocaine-Hydrocort, Perianal, 3-0.5 % CREA Use as directed three times per day as needed 28.3 g 1   metFORMIN (GLUCOPHAGE) 500 MG tablet TAKE 4 TABLETS BY MOUTH DAILY WITH BREAKFAST. 360 tablet 3   mirtazapine (REMERON) 15 MG tablet TAKE 1 TABLET BY  MOUTH EVERYDAY AT BEDTIME 90 tablet 0   Multiple Vitamin (MULTIVITAMIN WITH MINERALS) TABS tablet Take 1 tablet by mouth daily at 12 noon.     nitroGLYCERIN (NITROSTAT) 0.4 MG SL tablet Place 1 tablet (0.4 mg total) under the tongue every 5 (five) minutes as needed for chest pain. 90 tablet 3   omeprazole (PRILOSEC) 40 MG capsule TAKE 1 CAP BY MOUTH DAILY. OPEN CAPSULE AND SWALLOW CONTENTS IN LIQUID OR APPLESAUCE     ondansetron (ZOFRAN) 4 MG tablet Take 1 tablet (4 mg total) by mouth every 8 (eight) hours as needed for nausea or vomiting. 30 tablet 3   vitamin B-12 1000 MCG tablet Take 1 tablet (1,000 mcg total) by mouth daily. 30 tablet 1   No current facility-administered medications on file prior to visit.        ROS:  All others reviewed and negative.  Objective        PE:  BP 128/78 (BP Location: Right Arm, Patient Position: Sitting, Cuff Size: Normal)   Pulse (!) 50   Temp 98.7 F (37.1 C) (Oral)   Ht 6\' 2"  (1.88 m)   Wt 256 lb (116.1 kg)   SpO2 98%   BMI 32.87 kg/m                 Constitutional: Pt appears in NAD               HENT: Head: NCAT.                Right Ear: External ear normal.                 Left Ear: External ear normal.                Eyes: . Pupils are equal, round, and reactive to light. Conjunctivae and EOM are normal               Nose: without d/c or deformity               Neck: Neck supple. Gross normal ROM               Cardiovascular: Normal rate and regular rhythm.  Pulmonary/Chest: Effort normal and breath sounds without rales or wheezing.                Abd:  Soft, NT, ND, + BS, no organomegaly               Neurological: Pt is alert. At baseline orientation, motor grossly intact               Skin: Skin is warm. No rashes, no other new lesions, LE edema - trace bilatearl               Psychiatric: Pt behavior is normal without agitation   Micro: none  Cardiac tracings I have personally interpreted today:  none  Pertinent  Radiological findings (summarize): none   Lab Results  Component Value Date   WBC 8.5 06/25/2023   HGB 12.4 (L) 06/25/2023   HCT 40.8 06/25/2023   PLT 158 06/25/2023   GLUCOSE 120 (H) 06/25/2023   CHOL 120 06/22/2023   TRIG 55.0 06/22/2023   HDL 50.80 06/22/2023   LDLDIRECT 116.0 01/15/2020   LDLCALC 58 06/22/2023   ALT 32 06/25/2023   AST 30 06/25/2023   NA 140 06/25/2023   K 5.0 06/25/2023   CL 106 06/25/2023   CREATININE 1.31 (H) 06/25/2023   BUN 29 (H) 06/25/2023   CO2 24 06/25/2023   TSH 2.62 06/22/2023   PSA 1.61 06/22/2023   INR 1.2 (H) 06/22/2023   HGBA1C 7.1 (H) 06/22/2023   MICROALBUR <0.7 06/22/2023   Assessment/Plan:  [name] is a 67 y.o. Black or African American [2] male with  has a past medical history of Arthritis, BACK PAIN, CAD (coronary artery disease) (06/28/2017), CAD in native artery, Cancer (HCC), Chronic bilateral low back pain with bilateral sciatica (01/12/2019), Degenerative arthritis of right knee (07/15/2016), DEGENERATIVE JOINT DISEASE, KNEES, BILATERAL, Depression, Diabetes (HCC) (10/01/2014), Diabetes mellitus, DIVERTICULOSIS, COLON, DVT, lower extremity (HCC), GERD (gastroesophageal reflux disease), History of kidney stones, Hyperlipidemia, Hypertension, HYPOGONADISM, MALE, HYPOTENSION, ORTHOSTATIC, Kidney stones, Long term (current) use of anticoagulants (08/17/2017), Long term current use of anticoagulant, Lumbosacral radiculopathy at S1 (06/14/2012), Maxillary sinus cyst (12/11/2018), Obesity, OBESITY, MORBID (03/04/2008), OSA (obstructive sleep apnea) (09/11/2013), Pulmonary emboli (HCC), Pulmonary embolism (HCC) (02/05/2011), RBBB (07/28/2017), Renal insufficiency, Superficial phlebitis of arm (03/18/2016), and Vitamin D deficiency (07/09/2020).  Encounter for well adult exam with abnormal findings Age and sex appropriate education and counseling updated with regular exercise and diet Referrals for preventative services - none  needed Immunizations addressed - none needed Smoking counseling  - none needed Evidence for depression or other mood disorder - none significant Most recent labs reviewed. I have personally reviewed and have noted: 1) the patient's medical and social history 2) The patient's current medications and supplements 3) The patient's height, weight, and BMI have been recorded in the chart   CKD (chronic kidney disease) stage 3, GFR 30-59 ml/min (HCC) Lab Results  Component Value Date   CREATININE 1.31 (H) 06/25/2023   Stable overall, cont to avoid nephrotoxins   Diabetes (HCC) Lab Results  Component Value Date   HGBA1C 7.1 (H) 06/22/2023   Uncontrolled, goal A1c < 7 , pt to continue current medical treatment glucotrol xl 2.5 mg every day, metformin er 500 mg - 4 every day as o/w decline change today   Vitamin D deficiency Last vitamin D Lab Results  Component Value Date   VD25OH 42.09 06/22/2023   Stable, cont oral replacement   Hyperlipidemia Lab Results  Component Value  Date   LDLCALC 58 06/22/2023   Stable, pt to continue current statin liptior 80 qd   Allergic rhinitis Mild to mod, for start allegra 180 every day prn,  to f/u any worsening symptoms or concerns  Followup: Return in about 6 months (around 12/28/2023).  Oliver Barre, MD 06/27/2023 8:30 PM [facility]

## 2023-06-27 NOTE — Assessment & Plan Note (Signed)
Lab Results  Component Value Date   LDLCALC 58 06/22/2023   Stable, pt to continue current statin liptior 80 qd

## 2023-06-27 NOTE — Patient Instructions (Signed)
Please continue all other medications as before, and refills have been done if requested.  Please have the pharmacy call with any other refills you may need.  Please continue your efforts at being more active, low cholesterol diet, and weight control.  You are otherwise up to date with prevention measures today.  Please keep your appointments with your specialists as you may have planned  Please make an Appointment to return in 6 months, or sooner if needed, also with Lab Appointment for testing done 3-5 days before at the FIRST FLOOR Lab (so this is for TWO appointments - please see the scheduling desk as you leave)  

## 2023-06-27 NOTE — Assessment & Plan Note (Signed)
Age and sex appropriate education and counseling updated with regular exercise and diet Referrals for preventative services - none needed Immunizations addressed - none needed Smoking counseling  - none needed Evidence for depression or other mood disorder - none significant Most recent labs reviewed. I have personally reviewed and have noted: 1) the patient's medical and social history 2) The patient's current medications and supplements 3) The patient's height, weight, and BMI have been recorded in the chart  

## 2023-06-27 NOTE — Assessment & Plan Note (Signed)
Mild to mod, for start allegra 180 every day prn,  to f/u any worsening symptoms or concerns

## 2023-06-27 NOTE — Assessment & Plan Note (Signed)
Lab Results  Component Value Date   CREATININE 1.31 (H) 06/25/2023   Stable overall, cont to avoid nephrotoxins

## 2023-06-28 ENCOUNTER — Telehealth: Payer: Self-pay

## 2023-06-28 NOTE — Telephone Encounter (Signed)
Transition Care Management Follow-up Telephone Call Date of discharge and from where: [name] 7/20 How have you been since you were released from the hospital? Doing ok Any questions or concerns? No  Items Reviewed: Did the pt receive and understand the discharge instructions provided? Yes  Medications obtained and verified? Yes  Other? No  Any new allergies since your discharge? No  Dietary orders reviewed? No Do you have support at home? Yes    Follow up appointments reviewed: PCP Hospital f/u appt confirmed? Yes  Scheduled to see  on  @ . Specialist Hospital f/u appt confirmed? No  Scheduled to see  on  @ . Are transportation arrangements needed? No  If their condition worsens, is the pt aware to call PCP or go to the Emergency Dept.? Yes Was the patient provided with contact information for the PCP's office or ED? Yes Was to pt encouraged to call back with questions or concerns? Yes

## 2023-07-01 ENCOUNTER — Other Ambulatory Visit: Payer: Self-pay

## 2023-07-01 ENCOUNTER — Other Ambulatory Visit: Payer: Self-pay | Admitting: Internal Medicine

## 2023-08-03 ENCOUNTER — Other Ambulatory Visit: Payer: Self-pay

## 2023-08-03 ENCOUNTER — Other Ambulatory Visit: Payer: Self-pay | Admitting: Internal Medicine

## 2023-08-06 ENCOUNTER — Other Ambulatory Visit: Payer: Self-pay | Admitting: Hematology

## 2023-08-06 DIAGNOSIS — E538 Deficiency of other specified B group vitamins: Secondary | ICD-10-CM

## 2023-08-24 ENCOUNTER — Other Ambulatory Visit: Payer: Self-pay | Admitting: Hematology

## 2023-09-07 DIAGNOSIS — L219 Seborrheic dermatitis, unspecified: Secondary | ICD-10-CM | POA: Diagnosis not present

## 2023-09-11 ENCOUNTER — Other Ambulatory Visit: Payer: Self-pay | Admitting: Internal Medicine

## 2023-09-13 ENCOUNTER — Encounter: Payer: Self-pay | Admitting: Internal Medicine

## 2023-09-13 ENCOUNTER — Ambulatory Visit (INDEPENDENT_AMBULATORY_CARE_PROVIDER_SITE_OTHER): Payer: PPO | Admitting: Internal Medicine

## 2023-09-13 VITALS — BP 130/80 | HR 65 | Temp 98.5°F | Ht 74.0 in | Wt 252.0 lb

## 2023-09-13 DIAGNOSIS — E1122 Type 2 diabetes mellitus with diabetic chronic kidney disease: Secondary | ICD-10-CM

## 2023-09-13 DIAGNOSIS — N183 Chronic kidney disease, stage 3 unspecified: Secondary | ICD-10-CM | POA: Diagnosis not present

## 2023-09-13 DIAGNOSIS — Z7984 Long term (current) use of oral hypoglycemic drugs: Secondary | ICD-10-CM

## 2023-09-13 DIAGNOSIS — Z125 Encounter for screening for malignant neoplasm of prostate: Secondary | ICD-10-CM

## 2023-09-13 DIAGNOSIS — E559 Vitamin D deficiency, unspecified: Secondary | ICD-10-CM

## 2023-09-13 DIAGNOSIS — R197 Diarrhea, unspecified: Secondary | ICD-10-CM | POA: Diagnosis not present

## 2023-09-13 DIAGNOSIS — Z23 Encounter for immunization: Secondary | ICD-10-CM

## 2023-09-13 DIAGNOSIS — E538 Deficiency of other specified B group vitamins: Secondary | ICD-10-CM | POA: Diagnosis not present

## 2023-09-13 LAB — CBC WITH DIFFERENTIAL/PLATELET
Basophils Absolute: 0 10*3/uL (ref 0.0–0.1)
Basophils Relative: 0.4 % (ref 0.0–3.0)
Eosinophils Absolute: 0.1 10*3/uL (ref 0.0–0.7)
Eosinophils Relative: 1.4 % (ref 0.0–5.0)
HCT: 41.3 % (ref 39.0–52.0)
Hemoglobin: 12.5 g/dL — ABNORMAL LOW (ref 13.0–17.0)
Lymphocytes Relative: 23.1 % (ref 12.0–46.0)
Lymphs Abs: 1.7 10*3/uL (ref 0.7–4.0)
MCHC: 30.3 g/dL (ref 30.0–36.0)
MCV: 72.4 fL — ABNORMAL LOW (ref 78.0–100.0)
Monocytes Absolute: 0.6 10*3/uL (ref 0.1–1.0)
Monocytes Relative: 7.8 % (ref 3.0–12.0)
Neutro Abs: 4.8 10*3/uL (ref 1.4–7.7)
Neutrophils Relative %: 67.3 % (ref 43.0–77.0)
Platelets: 210 10*3/uL (ref 150.0–400.0)
RBC: 5.7 Mil/uL (ref 4.22–5.81)
RDW: 14.8 % (ref 11.5–15.5)
WBC: 7.2 10*3/uL (ref 4.0–10.5)

## 2023-09-13 LAB — PSA: PSA: 0.78 ng/mL (ref 0.10–4.00)

## 2023-09-13 LAB — VITAMIN D 25 HYDROXY (VIT D DEFICIENCY, FRACTURES): VITD: 45.75 ng/mL (ref 30.00–100.00)

## 2023-09-13 LAB — HEMOGLOBIN A1C: Hgb A1c MFr Bld: 7.2 % — ABNORMAL HIGH (ref 4.6–6.5)

## 2023-09-13 LAB — VITAMIN B12: Vitamin B-12: 730 pg/mL (ref 211–911)

## 2023-09-13 MED ORDER — METFORMIN HCL ER 500 MG PO TB24: 2000.0000 mg | ORAL_TABLET | Freq: Every day | ORAL | 3 refills | Status: DC

## 2023-09-13 MED ORDER — CHLORTHALIDONE 25 MG PO TABS: 25.0000 mg | ORAL_TABLET | Freq: Every day | ORAL | 3 refills | Status: DC

## 2023-09-13 NOTE — Patient Instructions (Signed)
Ok to work on decreased caffeine  intake  You may want to try Benefiber or Fibercon OTC   Ok to immodium as needed   We need to change the metformin to the ER 500 mg - 4 tabs per day  Please continue all other medications as before, and refills have been done if requested.  Please have the pharmacy call with any other refills you may need.  Please keep your appointments with your specialists as you may have planned  Please go to the LAB at the blood drawing area for the tests to be done  You will be contacted by phone if any changes need to be made immediately.  Otherwise, you will receive a letter about your results with an explanation, but please check with MyChart first.  Please make an Appointment to return in 6 months, or sooner if needed

## 2023-09-13 NOTE — Progress Notes (Unsigned)
Patient ID: [name], male   DOB: 04-May-1956, 67 y.o.   MRN: [id number]        Chief Complaint: follow up diarrhea, dm, low vit d and b12       HPI:  [name] is a 67 y.o. male here with c/o recent onset diarrhea for  wks in addition to more chronic nauea, now watery and loose, several times per day, seems to have onset every times he eats and runs right through him, he thinks may be related to increased caffeine use; sometimes has some diffuse dull abd pains, no vomiting, fever, blood.  Has had some recent wt loss he thought ay be intentional.   Peak wt has been about 400.  Pt denies chest pain, increased sob or doe, wheezing, orthopnea, PND, increased LE swelling, palpitations, dizziness or syncope.   Pt denies polydipsia, polyuria, or new focal neuro s/s.   Wt Readings from Last 3 Encounters:  09/13/23 252 lb (114.3 kg)  06/27/23 256 lb (116.1 kg)  06/25/23 250 lb (113.4 kg)   BP Readings from Last 3 Encounters:  09/13/23 130/80  06/27/23 128/78  06/25/23 137/75         Past Medical History:  Diagnosis Date   Arthritis    BACK PAIN    CAD (coronary artery disease) 06/28/2017   CAD in native artery    a. moderate by cardiac CT 2016.   Cancer [facility])    Chronic bilateral low back pain with bilateral sciatica 01/12/2019   Degenerative arthritis of right knee 07/15/2016   Injected in 07/15/2016 Orthovisc injection started 10/07/2016 DepoMedrol 40 inj on 04/11/17, 25 cc aspirated Orthovisc injection in 05/30/2017     DEGENERATIVE JOINT DISEASE, KNEES, BILATERAL    Depression    Diabetes (HCC) 10/01/2014   Diabetes mellitus    type II   DIVERTICULOSIS, COLON    DVT, lower extremity (HCC)    right   GERD (gastroesophageal reflux disease)    History of kidney stones    Hyperlipidemia    Hypertension    HYPOGONADISM, MALE    HYPOTENSION, ORTHOSTATIC    Kidney stones    Long term (current) use of anticoagulants 08/17/2017   Long term current use of anticoagulant     Lumbosacral radiculopathy at S1 06/14/2012   Maxillary sinus cyst 12/11/2018   Obesity    OBESITY, MORBID 03/04/2008   Qualifier: Diagnosis of  By: Jonny Ruiz MD, Len Blalock    OSA (obstructive sleep apnea) 09/11/2013   Pulmonary emboli (HCC)    Pulmonary embolism (HCC) 02/05/2011   RBBB 07/28/2017   Renal insufficiency    a. prior h/o, does not appear chronic.   Superficial phlebitis of arm 03/18/2016   Vitamin D deficiency 07/09/2020   Past Surgical History:  Procedure Laterality Date   ankle surgury  2001 bilateral   with bone spurs and torn tendon   COLONOSCOPY  03/18/2010   EDG  2008   chronic Duodenitis   FLEXIBLE SIGMOIDOSCOPY N/A 11/03/2021   Procedure: FLEXIBLE SIGMOIDOSCOPY;  Surgeon: Sherrilyn Rist, MD;  Location: [facility];  Service: Gastroenterology;  Laterality: N/A;   GASTRIC BYPASS     HEEL SPUR SURGERY Bilateral 2005   IR ANGIOGRAM PELVIS SELECTIVE OR SUPRASELECTIVE  11/02/2021   IR ANGIOGRAM PELVIS SELECTIVE OR SUPRASELECTIVE  11/04/2021   IR ANGIOGRAM SELECTIVE EACH ADDITIONAL VESSEL  11/02/2021   IR ANGIOGRAM SELECTIVE EACH ADDITIONAL VESSEL  11/02/2021   IR ANGIOGRAM SELECTIVE EACH ADDITIONAL VESSEL  11/02/2021   IR ANGIOGRAM SELECTIVE EACH ADDITIONAL VESSEL  11/04/2021   IR ANGIOGRAM SELECTIVE EACH ADDITIONAL VESSEL  11/04/2021   IR ANGIOGRAM SELECTIVE EACH ADDITIONAL VESSEL  11/04/2021   IR ANGIOGRAM SELECTIVE EACH ADDITIONAL VESSEL  11/04/2021   IR ANGIOGRAM VISCERAL SELECTIVE  11/02/2021   IR ANGIOGRAM VISCERAL SELECTIVE  11/04/2021   IR EMBO ART  VEN HEMORR LYMPH EXTRAV  INC GUIDE ROADMAPPING  11/02/2021   IR EMBO ART  VEN HEMORR LYMPH EXTRAV  INC GUIDE ROADMAPPING  11/04/2021   IR EMBO ART  VEN HEMORR LYMPH EXTRAV  INC GUIDE ROADMAPPING  11/04/2021   IR RADIOLOGIST EVAL & MGMT  06/01/2023   IR US GUIDE VASC ACCESS RIGHT  11/02/2021   IR US GUIDE VASC ACCESS RIGHT  11/04/2021   knee surgury  2000 & 2003    cartilage damage   LEFT HEART CATH AND  CORONARY ANGIOGRAPHY N/A 02/16/2021   Procedure: LEFT HEART CATH AND CORONARY ANGIOGRAPHY;  Surgeon: Tonny Bollman, MD;  Location: [facility] INV[facility] CV LAB;  Service: Cardiovascular;  Laterality: N/A;   LUMBAR LAMINECTOMY/DECOMPRESSION MICRODISCECTOMY  09/28/2012   Procedure: LUMBAR LAMINECTOMY/DECOMPRESSION MICRODISCECTOMY 2 LEVELS;  Surgeon: Drucilla Schmidt, MD;  Location: [facility];  Service: Orthopedics;  Laterality: Left;  Decompressive laminectomy L4-L5. Microdiscectomy L5-S1   Skin removal surgery     extra abdominal skin removed from his weight loss   TONSILLECTOMY AND ADENOIDECTOMY  age 65 or 6   TOTAL KNEE ARTHROPLASTY  01/10/2012   Procedure: TOTAL KNEE ARTHROPLASTY;  Surgeon: Shelda Pal, MD;  Location: [facility];  Service: Orthopedics;  Laterality: Left;   TOTAL KNEE ARTHROPLASTY Right 11/18/2020   Procedure: TOTAL KNEE ARTHROPLASTY;  Surgeon: Durene Romans, MD;  Location: [facility];  Service: Orthopedics;  Laterality: Right;  70 mins   UPPER GASTROINTESTINAL ENDOSCOPY  03/18/2010    reports that he quit smoking about 44 years ago. His smoking use included cigarettes. He started smoking about 49 years ago. He has a 1.3 pack-year smoking history. He has never used smokeless tobacco. He reports current alcohol use of about 2.0 standard drinks of alcohol per week. He reports that he does not use drugs. family history includes Diabetes in his brother, sister, sister, and sister; Heart failure in his sister; Hypertension in his mother; Prostate cancer in his father. Allergies  Allergen Reactions   Adhesive [Tape] Other (See Comments)    Painful, Please use "paper" tape   Other     NO BLOOD -JEH[employer]'S WITNESS-SIGNED REFUSAL BUT WOULD TAKE ALBUMIN   Current Outpatient Medications on File Prior to Visit  Medication Sig Dispense Refill   Artificial Tear Ointment (ARTIFICIAL TEARS) ointment Place 1 drop into both eyes every 4 (four) hours as needed (dry eyes).      atorvastatin (LIPITOR) 80 MG tablet  TAKE 1 TABLET BY MOUTH EVERY DAY 90 tablet 3   Cholecalciferol (VITAMIN D3) 125 MCG (5000 UT) CAPS Take 5,000 Units by mouth daily.      ferrous sulfate 325 (65 FE) MG EC tablet Take 1 tablet by mouth daily.     ferrous sulfate 325 (65 FE) MG EC tablet TAKE 1 TABLET BY MOUTH EVERY DAY 90 tablet 1   folic acid (FOLVITE) 1 MG tablet TAKE 1 TABLET BY MOUTH EVERY DAY 90 tablet 1   glipiZIDE (GLUCOTROL XL) 2.5 MG 24 hr tablet TAKE 1 TABLET BY MOUTH EVERY DAY WITH BREAKFAST 90 tablet 3   Lidocaine-Hydrocort, Perianal, 3-0.5 % CREA Use as directed three  times per day as needed 28.3 g 1   mirtazapine (REMERON) 15 MG tablet TAKE 1 TABLET BY MOUTH EVERYDAY AT BEDTIME 90 tablet 0   Multiple Vitamin (MULTIVITAMIN WITH MINERALS) TABS tablet Take 1 tablet by mouth daily at 12 noon.     nitroGLYCERIN (NITROSTAT) 0.4 MG SL tablet Place 1 tablet (0.4 mg total) under the tongue every 5 (five) minutes as needed for chest pain. 90 tablet 3   omeprazole (PRILOSEC) 40 MG capsule TAKE 1 CAP BY MOUTH DAILY. OPEN CAPSULE AND SWALLOW CONTENTS IN LIQUID OR APPLESAUCE     ondansetron (ZOFRAN) 4 MG tablet Take 1 tablet (4 mg total) by mouth every 8 (eight) hours as needed for nausea or vomiting. 30 tablet 3   ONETOUCH ULTRA TEST test strip USE AS INSTRUCTED 100 strip 12   vitamin B-12 1000 MCG tablet Take 1 tablet (1,000 mcg total) by mouth daily. 30 tablet 1   No current facility-administered medications on file prior to visit.        ROS:  All others reviewed and negative.  Objective        PE:  BP 130/80 (BP Location: Right Arm, Patient Position: Sitting, Cuff Size: Normal)   Pulse 65   Temp 98.5 F (36.9 C) (Oral)   Ht 6\' 2"  (1.88 m)   Wt 252 lb (114.3 kg)   SpO2 99%   BMI 32.35 kg/m                 Constitutional: Pt appears in NAD               HENT: Head: NCAT.                Right Ear: External ear normal.                 Left Ear: External ear normal.                Eyes: . Pupils are equal, round,  and reactive to light. Conjunctivae and EOM are normal               Nose: without d/c or deformity               Neck: Neck supple. Gross normal ROM               Cardiovascular: Normal rate and regular rhythm.                 Pulmonary/Chest: Effort normal and breath sounds without rales or wheezing.                Abd:  Soft, NT, ND, + BS, no organomegaly               Neurological: Pt is alert. At baseline orientation, motor grossly intact               Skin: Skin is warm. No rashes, no other new lesions, LE edema - none               Psychiatric: Pt behavior is normal without agitation   Micro: none  Cardiac tracings I have personally interpreted today:  none  Pertinent Radiological findings (summarize): none   Lab Results  Component Value Date   WBC 7.2 09/13/2023   HGB 12.5 Repeated and verified X2. (L) 09/13/2023   HCT 41.3 Repeated and verified X2. 09/13/2023   PLT 210.0 09/13/2023   GLUCOSE 172 (H) 09/13/2023   CHOL  98 09/13/2023   TRIG 60.0 09/13/2023   HDL 42.10 09/13/2023   LDLDIRECT 116.0 01/15/2020   LDLCALC 43 09/13/2023   ALT 29 09/13/2023   AST 20 09/13/2023   NA 138 09/13/2023   K 4.5 09/13/2023   CL 102 09/13/2023   CREATININE 1.42 09/13/2023   BUN 22 09/13/2023   CO2 27 09/13/2023   TSH 2.62 06/22/2023   PSA 0.78 09/13/2023   INR 1.2 (H) 06/22/2023   HGBA1C 7.2 (H) 09/13/2023   MICROALBUR <0.7 06/22/2023   Assessment/Plan:  XAYVION BERQUIST is a 67 y.o. Black or African American [2] male with  has a past medical history of Arthritis, BACK PAIN, CAD (coronary artery disease) (06/28/2017), CAD in native artery, Cancer (HCC), Chronic bilateral low back pain with bilateral sciatica (01/12/2019), Degenerative arthritis of right knee (07/15/2016), DEGENERATIVE JOINT DISEASE, KNEES, BILATERAL, Depression, Diabetes (HCC) (10/01/2014), Diabetes mellitus, DIVERTICULOSIS, COLON, DVT, lower extremity (HCC), GERD (gastroesophageal reflux disease), History of kidney  stones, Hyperlipidemia, Hypertension, HYPOGONADISM, MALE, HYPOTENSION, ORTHOSTATIC, Kidney stones, Long term (current) use of anticoagulants (08/17/2017), Long term current use of anticoagulant, Lumbosacral radiculopathy at S1 (06/14/2012), Maxillary sinus cyst (12/11/2018), Obesity, OBESITY, MORBID (03/04/2008), OSA (obstructive sleep apnea) (09/11/2013), Pulmonary emboli (HCC), Pulmonary embolism (HCC) (02/05/2011), RBBB (07/28/2017), Renal insufficiency, Superficial phlebitis of arm (03/18/2016), and Vitamin D deficiency (07/09/2020).  Diarrhea Exam benign, etiology unclear, for reduced caffeine intake, immodium prn, fiber trial such as benefiber, labs as ordered  Diabetes (HCC) Lab Results  Component Value Date   HGBA1C 7.2 (H) 09/13/2023   Uncontrolled, for change metformin to ER 500 mg - 4 every day   Vitamin D deficiency Last vitamin D Lab Results  Component Value Date   VD25OH 45.75 09/13/2023   Stable, cont oral replacement   B12 deficiency Lab Results  Component Value Date   VITAMINB12 730 09/13/2023   Stable, cont oral replacement - b12 1000 mcg qd  Followup: Return in about 6 months (around 03/13/2024).  Oliver Barre, MD 09/15/2023 8:22 PM [facility] - [facility]

## 2023-09-14 LAB — BASIC METABOLIC PANEL
BUN: 22 mg/dL (ref 6–23)
CO2: 27 meq/L (ref 19–32)
Calcium: 9.7 mg/dL (ref 8.4–10.5)
Chloride: 102 meq/L (ref 96–112)
Creatinine, Ser: 1.42 mg/dL (ref 0.40–1.50)
GFR: 50.34 mL/min — ABNORMAL LOW (ref >60.00)
Glucose, Bld: 172 mg/dL — ABNORMAL HIGH (ref 70–99)
Potassium: 4.5 meq/L (ref 3.5–5.1)
Sodium: 138 meq/L (ref 135–145)

## 2023-09-14 LAB — LIPID PANEL
Cholesterol: 98 mg/dL (ref 0–200)
HDL: 42.1 mg/dL (ref >39.00)
LDL Cholesterol: 43 mg/dL (ref 0–99)
NonHDL: 55.44
Total CHOL/HDL Ratio: 2
Triglycerides: 60 mg/dL (ref 0.0–149.0)
VLDL: 12 mg/dL (ref 0.0–40.0)

## 2023-09-14 LAB — HEPATIC FUNCTION PANEL
ALT: 29 U/L (ref 0–53)
AST: 20 U/L (ref 0–37)
Albumin: 4.5 g/dL (ref 3.5–5.2)
Alkaline Phosphatase: 89 U/L (ref 39–117)
Bilirubin, Direct: 0.2 mg/dL (ref 0.0–0.3)
Total Bilirubin: 1 mg/dL (ref 0.2–1.2)
Total Protein: 6.5 g/dL (ref 6.0–8.3)

## 2023-09-14 NOTE — Progress Notes (Signed)
The test results show that your current treatment is OK, as the tests are stable.  Please continue the same plan.  There is no other need for change of treatment or further evaluation based on these results, at this time.  thanks 

## 2023-09-15 ENCOUNTER — Encounter: Payer: Self-pay | Admitting: Internal Medicine

## 2023-09-15 DIAGNOSIS — R197 Diarrhea, unspecified: Secondary | ICD-10-CM | POA: Insufficient documentation

## 2023-09-15 NOTE — Assessment & Plan Note (Signed)
Lab Results  Component Value Date   HGBA1C 7.2 (H) 09/13/2023   Uncontrolled, for change metformin to ER 500 mg - 4 every day

## 2023-09-15 NOTE — Assessment & Plan Note (Addendum)
Exam benign, etiology unclear, for reduced caffeine intake, immodium prn, fiber trial such as benefiber, labs as ordered

## 2023-09-15 NOTE — Assessment & Plan Note (Signed)
Last vitamin D Lab Results  Component Value Date   VD25OH 45.75 09/13/2023   Stable, cont oral replacement

## 2023-09-15 NOTE — Assessment & Plan Note (Signed)
Lab Results  Component Value Date   VITAMINB12 730 09/13/2023   Stable, cont oral replacement - b12 1000 mcg qd

## 2023-10-28 ENCOUNTER — Other Ambulatory Visit: Payer: Self-pay

## 2023-10-28 DIAGNOSIS — D62 Acute posthemorrhagic anemia: Secondary | ICD-10-CM

## 2023-10-28 DIAGNOSIS — D500 Iron deficiency anemia secondary to blood loss (chronic): Secondary | ICD-10-CM

## 2023-10-30 NOTE — Assessment & Plan Note (Signed)
-  he also had gastric bypass surgery which also contributes to his anemia -he developed rectal bleeding following prostate biopsy secondary to anticoagulation and was admitted for severe anemia 11/02/21 - 11/08/21 -Hemoglobin dropped to 4.9, he does not accept blood products as a Jehovah's Witness.  He underwent IR rectal artery embolization, received vitamin K, Aranesp, IV iron, and was started on oral B12 and folic acid. -he received IV Feraheme in 11/2021 and 08/2022. No clinically improvement after iv iron, will give iv iron only ferritin<20  -he is on oral iron once daily

## 2023-10-31 ENCOUNTER — Inpatient Hospital Stay: Payer: PPO | Attending: Hematology

## 2023-10-31 ENCOUNTER — Inpatient Hospital Stay: Payer: PPO | Admitting: Hematology

## 2023-10-31 ENCOUNTER — Encounter: Payer: Self-pay | Admitting: Hematology

## 2023-10-31 VITALS — BP 124/76 | HR 57 | Temp 98.1°F | Resp 18 | Ht 74.0 in | Wt 260.0 lb

## 2023-10-31 DIAGNOSIS — K625 Hemorrhage of anus and rectum: Secondary | ICD-10-CM | POA: Diagnosis not present

## 2023-10-31 DIAGNOSIS — E119 Type 2 diabetes mellitus without complications: Secondary | ICD-10-CM | POA: Diagnosis not present

## 2023-10-31 DIAGNOSIS — D500 Iron deficiency anemia secondary to blood loss (chronic): Secondary | ICD-10-CM | POA: Insufficient documentation

## 2023-10-31 DIAGNOSIS — E785 Hyperlipidemia, unspecified: Secondary | ICD-10-CM | POA: Diagnosis not present

## 2023-10-31 DIAGNOSIS — Z79899 Other long term (current) drug therapy: Secondary | ICD-10-CM | POA: Diagnosis not present

## 2023-10-31 DIAGNOSIS — Z7901 Long term (current) use of anticoagulants: Secondary | ICD-10-CM | POA: Diagnosis not present

## 2023-10-31 DIAGNOSIS — Z87442 Personal history of urinary calculi: Secondary | ICD-10-CM | POA: Diagnosis not present

## 2023-10-31 DIAGNOSIS — Z9884 Bariatric surgery status: Secondary | ICD-10-CM | POA: Diagnosis not present

## 2023-10-31 DIAGNOSIS — Z8546 Personal history of malignant neoplasm of prostate: Secondary | ICD-10-CM | POA: Insufficient documentation

## 2023-10-31 DIAGNOSIS — Z86718 Personal history of other venous thrombosis and embolism: Secondary | ICD-10-CM | POA: Insufficient documentation

## 2023-10-31 DIAGNOSIS — Z9089 Acquired absence of other organs: Secondary | ICD-10-CM | POA: Diagnosis not present

## 2023-10-31 DIAGNOSIS — D62 Acute posthemorrhagic anemia: Secondary | ICD-10-CM

## 2023-10-31 DIAGNOSIS — Z86711 Personal history of pulmonary embolism: Secondary | ICD-10-CM | POA: Insufficient documentation

## 2023-10-31 DIAGNOSIS — Z8719 Personal history of other diseases of the digestive system: Secondary | ICD-10-CM | POA: Insufficient documentation

## 2023-10-31 LAB — CBC WITH DIFFERENTIAL (CANCER CENTER ONLY)
Abs Immature Granulocytes: 0.02 10*3/uL (ref 0.00–0.07)
Basophils Absolute: 0 10*3/uL (ref 0.0–0.1)
Basophils Relative: 0 %
Eosinophils Absolute: 0.1 10*3/uL (ref 0.0–0.5)
Eosinophils Relative: 2 %
HCT: 38.1 % — ABNORMAL LOW (ref 39.0–52.0)
Hemoglobin: 12.2 g/dL — ABNORMAL LOW (ref 13.0–17.0)
Immature Granulocytes: 0 %
Lymphocytes Relative: 22 %
Lymphs Abs: 1.3 10*3/uL (ref 0.7–4.0)
MCH: 23.2 pg — ABNORMAL LOW (ref 26.0–34.0)
MCHC: 32 g/dL (ref 30.0–36.0)
MCV: 72.4 fL — ABNORMAL LOW (ref 80.0–100.0)
Monocytes Absolute: 0.5 10*3/uL (ref 0.1–1.0)
Monocytes Relative: 8 %
Neutro Abs: 4 10*3/uL (ref 1.7–7.7)
Neutrophils Relative %: 68 %
Platelet Count: 173 10*3/uL (ref 150–400)
RBC: 5.26 MIL/uL (ref 4.22–5.81)
RDW: 15.9 % — ABNORMAL HIGH (ref 11.5–15.5)
WBC Count: 5.9 10*3/uL (ref 4.0–10.5)
nRBC: 0 % (ref 0.0–0.2)

## 2023-10-31 LAB — IRON AND IRON BINDING CAPACITY (CC-WL,HP ONLY)
Iron: 85 ug/dL (ref 45–182)
Saturation Ratios: 29 % (ref 17.9–39.5)
TIBC: 295 ug/dL (ref 250–450)
UIBC: 210 ug/dL (ref 117–376)

## 2023-10-31 LAB — FERRITIN: Ferritin: 151 ng/mL (ref 24–336)

## 2023-10-31 NOTE — Progress Notes (Signed)
[facility]   [phone] [phone]   Clinic Follow up Note   Patient Care Team: Corwin Levins, MD as PCP - General Delton See Faustino Congress, MD as PCP - Cardiology (Cardiology) Sheran Luz, MD as Consulting Physician (Physical Medicine and Rehabilitation) Delrae Alfred, MD as Referring Physician (Surgical Oncology) Romero Belling, MD (Inactive) as Consulting Physician (Endocrinology) Nelson Chimes, MD as Consulting Physician (Ophthalmology)  Date of Service:  10/31/2023  CHIEF COMPLAINT: f/u of anemia  CURRENT THERAPY:  Ferric sulfate 1 tablet daily  Oncology History   Iron deficiency anemia due to chronic blood loss -he also had gastric bypass surgery which also contributes to his anemia -he developed rectal bleeding following prostate biopsy secondary to anticoagulation and was admitted for severe anemia 11/02/21 - 11/08/21 -Hemoglobin dropped to 4.9, he does not accept blood products as a [employer].  He underwent IR rectal artery embolization, received vitamin K, Aranesp, IV iron, and was started on oral B12 and folic acid. -he received IV Feraheme in 11/2021 and 08/2022. No clinically improvement after iv iron, will give iv iron only ferritin<20  -he is on oral iron once daily   Assessment and Plan    Anemia Hemoglobin is 12.2 g/dL (normal: 29-56 g/dL). Currently taking oral iron (ferrous sulfate) daily. No signs of GI bleeding, stable weight, and good energy levels. Iron level checked today, results pending. Previous iron level a year ago was normal (193 mcg/dL). Discussed taking iron after meals with orange juice to enhance absorption. Emphasized regular monitoring of blood counts and iron levels. - Continue oral iron (ferrous sulfate) daily - Take iron after meals with orange juice - Check blood counts biannually - Check iron level annually or biannually - Follow up with primary care physician  Prostate Cancer  (Post-Radiation) Post-radiation prostate cancer under surveillance. Last PSA level a month ago was 0.78 ng/mL (normal). Continued monitoring required. - Continue surveillance - Follow up with primary care physician for PSA monitoring  General Health Maintenance Up-to-date on flu and COVID vaccinations. Last colonoscopy in 2022, next due as per schedule. - Ensure vaccinations are up-to-date - Follow up for next colonoscopy as scheduled  Follow-up - Follow up as needed - Use MyChart to review results - Contact if iron levels are low or if there are any concerns.         SUMMARY OF ONCOLOGIC HISTORY: Oncology History  Malignant neoplasm of prostate (HCC)  10/26/2021 Cancer Staging   Staging form: Prostate, AJCC 8th Edition - Clinical stage from 10/26/2021: Stage IIB (cT1c, cN0, cM0, PSA: 6.8, Grade Group: 2) - Signed by Marcello Fennel, PA-C on 01/05/2022 Histopathologic type: Adenocarcinoma, NOS Stage prefix: Initial diagnosis Prostate specific antigen (PSA) range: Less than 10 Gleason primary pattern: 3 Gleason secondary pattern: 4 Gleason score: 7 Histologic grading system: 5 grade system Number of biopsy cores examined: 12 Number of biopsy cores positive: 5 Location of positive needle core biopsies: Both sides   11/15/2021 Initial Diagnosis   Malignant neoplasm of prostate [facility])      Discussed the use of AI scribe software for clinical note transcription with the patient, who gave verbal consent to proceed.  History of Present Illness   The patient, a 67 year old with a history of anemia and prostate cancer, presents for a routine follow-up. He reports no new medical issues over the past year and denies any gastrointestinal bleeding. He has been maintaining a good appetite with stable weight and energy levels. He continues to take oral  iron (ferrous sulfate) once daily, which he obtains over-the-counter. He has not noticed any changes in his health since his last visit a  year ago.  Regarding his prostate cancer, he underwent radiation therapy and is currently under surveillance with no ongoing treatment. He reports no new symptoms or concerns related to his prostate cancer.         All other systems were reviewed with the patient and are negative.  MEDICAL HISTORY:  Past Medical History:  Diagnosis Date   Arthritis    BACK PAIN    CAD (coronary artery disease) 06/28/2017   CAD in native artery    a. moderate by cardiac CT 2016.   Cancer [facility])    Chronic bilateral low back pain with bilateral sciatica 01/12/2019   Degenerative arthritis of right knee 07/15/2016   Injected in 07/15/2016 Orthovisc injection started 10/07/2016 DepoMedrol 40 inj on 04/11/17, 25 cc aspirated Orthovisc injection in 05/30/2017     DEGENERATIVE JOINT DISEASE, KNEES, BILATERAL    Depression    Diabetes (HCC) 10/01/2014   Diabetes mellitus    type II   DIVERTICULOSIS, COLON    DVT, lower extremity (HCC)    right   GERD (gastroesophageal reflux disease)    History of kidney stones    Hyperlipidemia    Hypertension    HYPOGONADISM, MALE    HYPOTENSION, ORTHOSTATIC    Kidney stones    Long term (current) use of anticoagulants 08/17/2017   Long term current use of anticoagulant    Lumbosacral radiculopathy at S1 06/14/2012   Maxillary sinus cyst 12/11/2018   Obesity    OBESITY, MORBID 03/04/2008   Qualifier: Diagnosis of  By: Jonny Ruiz MD, Len Blalock    OSA (obstructive sleep apnea) 09/11/2013   Pulmonary emboli (HCC)    Pulmonary embolism (HCC) 02/05/2011   RBBB 07/28/2017   Renal insufficiency    a. prior h/o, does not appear chronic.   Superficial phlebitis of arm 03/18/2016   Vitamin D deficiency 07/09/2020    SURGICAL HISTORY: Past Surgical History:  Procedure Laterality Date   ankle surgury  2001 bilateral   with bone spurs and torn tendon   COLONOSCOPY  03/18/2010   EDG  2008   chronic Duodenitis   FLEXIBLE SIGMOIDOSCOPY N/A 11/03/2021   Procedure:  FLEXIBLE SIGMOIDOSCOPY;  Surgeon: Sherrilyn Rist, MD;  Location: [facility];  Service: Gastroenterology;  Laterality: N/A;   GASTRIC BYPASS     HEEL SPUR SURGERY Bilateral 2005   IR ANGIOGRAM PELVIS SELECTIVE OR SUPRASELECTIVE  11/02/2021   IR ANGIOGRAM PELVIS SELECTIVE OR SUPRASELECTIVE  11/04/2021   IR ANGIOGRAM SELECTIVE EACH ADDITIONAL VESSEL  11/02/2021   IR ANGIOGRAM SELECTIVE EACH ADDITIONAL VESSEL  11/02/2021   IR ANGIOGRAM SELECTIVE EACH ADDITIONAL VESSEL  11/02/2021   IR ANGIOGRAM SELECTIVE EACH ADDITIONAL VESSEL  11/04/2021   IR ANGIOGRAM SELECTIVE EACH ADDITIONAL VESSEL  11/04/2021   IR ANGIOGRAM SELECTIVE EACH ADDITIONAL VESSEL  11/04/2021   IR ANGIOGRAM SELECTIVE EACH ADDITIONAL VESSEL  11/04/2021   IR ANGIOGRAM VISCERAL SELECTIVE  11/02/2021   IR ANGIOGRAM VISCERAL SELECTIVE  11/04/2021   IR EMBO ART  VEN HEMORR LYMPH EXTRAV  INC GUIDE ROADMAPPING  11/02/2021   IR EMBO ART  VEN HEMORR LYMPH EXTRAV  INC GUIDE ROADMAPPING  11/04/2021   IR EMBO ART  VEN HEMORR LYMPH EXTRAV  INC GUIDE ROADMAPPING  11/04/2021   IR RADIOLOGIST EVAL & MGMT  06/01/2023   IR US GUIDE VASC ACCESS RIGHT  11/02/2021  IR US GUIDE VASC ACCESS RIGHT  11/04/2021   knee surgury  2000 & 2003    cartilage damage   LEFT HEART CATH AND CORONARY ANGIOGRAPHY N/A 02/16/2021   Procedure: LEFT HEART CATH AND CORONARY ANGIOGRAPHY;  Surgeon: Tonny Bollman, MD;  Location: [facility] INVASIVE CV LAB;  Service: Cardiovascular;  Laterality: N/A;   LUMBAR LAMINECTOMY/DECOMPRESSION MICRODISCECTOMY  09/28/2012   Procedure: LUMBAR LAMINECTOMY/DECOMPRESSION MICRODISCECTOMY 2 LEVELS;  Surgeon: Drucilla Schmidt, MD;  Location: [facility];  Service: Orthopedics;  Laterality: Left;  Decompressive laminectomy L4-L5. Microdiscectomy L5-S1   Skin removal surgery     extra abdominal skin removed from his weight loss   TONSILLECTOMY AND ADENOIDECTOMY  age 32 or 6   TOTAL KNEE ARTHROPLASTY  01/10/2012   Procedure: TOTAL KNEE ARTHROPLASTY;   Surgeon: Shelda Pal, MD;  Location: [facility];  Service: Orthopedics;  Laterality: Left;   TOTAL KNEE ARTHROPLASTY Right 11/18/2020   Procedure: TOTAL KNEE ARTHROPLASTY;  Surgeon: Durene Romans, MD;  Location: [facility];  Service: Orthopedics;  Laterality: Right;  70 mins   UPPER GASTROINTESTINAL ENDOSCOPY  03/18/2010    I have reviewed the social history and family history with the patient and they are unchanged from previous note.  ALLERGIES:  is allergic to adhesive [tape] and other.  MEDICATIONS:  Current Outpatient Medications  Medication Sig Dispense Refill   Artificial Tear Ointment (ARTIFICIAL TEARS) ointment Place 1 drop into both eyes every 4 (four) hours as needed (dry eyes).      atorvastatin (LIPITOR) 80 MG tablet TAKE 1 TABLET BY MOUTH EVERY DAY 90 tablet 3   chlorthalidone (HYGROTON) 25 MG tablet Take 1 tablet (25 mg total) by mouth daily. 90 tablet 3   Cholecalciferol (VITAMIN D3) 125 MCG (5000 UT) CAPS Take 5,000 Units by mouth daily.      ferrous sulfate 325 (65 FE) MG EC tablet Take 1 tablet by mouth daily.     ferrous sulfate 325 (65 FE) MG EC tablet TAKE 1 TABLET BY MOUTH EVERY DAY 90 tablet 1   folic acid (FOLVITE) 1 MG tablet TAKE 1 TABLET BY MOUTH EVERY DAY 90 tablet 1   glipiZIDE (GLUCOTROL XL) 2.5 MG 24 hr tablet TAKE 1 TABLET BY MOUTH EVERY DAY WITH BREAKFAST 90 tablet 3   Lidocaine-Hydrocort, Perianal, 3-0.5 % CREA Use as directed three times per day as needed 28.3 g 1   metFORMIN (GLUCOPHAGE-XR) 500 MG 24 hr tablet Take 4 tablets (2,000 mg total) by mouth daily with breakfast. 360 tablet 3   mirtazapine (REMERON) 15 MG tablet TAKE 1 TABLET BY MOUTH EVERYDAY AT BEDTIME 90 tablet 0   Multiple Vitamin (MULTIVITAMIN WITH MINERALS) TABS tablet Take 1 tablet by mouth daily at 12 noon.     nitroGLYCERIN (NITROSTAT) 0.4 MG SL tablet Place 1 tablet (0.4 mg total) under the tongue every 5 (five) minutes as needed for chest pain. 90 tablet 3   omeprazole (PRILOSEC) 40 MG  capsule TAKE 1 CAP BY MOUTH DAILY. OPEN CAPSULE AND SWALLOW CONTENTS IN LIQUID OR APPLESAUCE     ondansetron (ZOFRAN) 4 MG tablet Take 1 tablet (4 mg total) by mouth every 8 (eight) hours as needed for nausea or vomiting. 30 tablet 3   ONETOUCH ULTRA TEST test strip USE AS INSTRUCTED 100 strip 12   vitamin B-12 1000 MCG tablet Take 1 tablet (1,000 mcg total) by mouth daily. 30 tablet 1   No current facility-administered medications for this visit.    PHYSICAL EXAMINATION: ECOG PERFORMANCE  STATUS: 0 - Asymptomatic  Vitals:   10/31/23 1304  BP: 124/76  Pulse: (!) 57  Resp: 18  Temp: 98.1 F (36.7 C)  SpO2: 99%   Wt Readings from Last 3 Encounters:  10/31/23 260 lb (117.9 kg)  09/13/23 252 lb (114.3 kg)  06/27/23 256 lb (116.1 kg)     GENERAL:alert, no distress and comfortable SKIN: skin color, texture, turgor are normal, no rashes or significant lesions EYES: normal, Conjunctiva are pink and non-injected, sclera clear Musculoskeletal:no cyanosis of digits and no clubbing  NEURO: alert & oriented x 3 with fluent speech, no focal motor/sensory deficits  Physical Exam          LABORATORY DATA:  I have reviewed the data as listed    Latest Ref Rng & Units 10/31/2023   12:16 PM 09/13/2023    3:21 PM 06/25/2023    7:48 PM  CBC  WBC 4.0 - 10.5 K/uL 5.9  7.2  8.5   Hemoglobin 13.0 - 17.0 g/dL 16.1  09.6 Repeated and verified X2.  12.4   Hematocrit 39.0 - 52.0 % 38.1  41.3 Repeated and verified X2.  40.8   Platelets 150 - 400 K/uL 173  210.0  158         Latest Ref Rng & Units 09/13/2023    3:21 PM 06/25/2023    7:48 PM 06/22/2023    9:14 AM  CMP  Glucose 70 - 99 mg/dL 045  409  811   BUN 6 - 23 mg/dL 22  29  37   Creatinine 0.40 - 1.50 mg/dL 9.14  7.82  9.56   Sodium 135 - 145 mEq/L 138  140  140   Potassium 3.5 - 5.1 mEq/L 4.5  5.0  4.8   Chloride 96 - 112 mEq/L 102  106  102   CO2 19 - 32 mEq/L 27  24  27    Calcium 8.4 - 10.5 mg/dL 9.7  9.3  9.8   Total Protein 6.0  - 8.3 g/dL 6.5  7.7  7.1   Total Bilirubin 0.2 - 1.2 mg/dL 1.0  1.0  0.8   Alkaline Phos 39 - 117 U/L 89  76  77   AST 0 - 37 U/L 20  30  22    ALT 0 - 53 U/L 29  32  29       RADIOGRAPHIC STUDIES: I have personally reviewed the radiological images as listed and agreed with the findings in the report. No results found.    No orders of the defined types were placed in this encounter.  All questions were answered. The patient knows to call the clinic with any problems, questions or concerns. No barriers to learning was detected. The total time spent in the appointment was 15 minutes.     Malachy Mood, MD 10/31/2023

## 2023-11-24 ENCOUNTER — Ambulatory Visit: Payer: PPO | Admitting: Internal Medicine

## 2023-11-24 ENCOUNTER — Encounter: Payer: Self-pay | Admitting: Internal Medicine

## 2023-11-24 VITALS — BP 120/66 | HR 50 | Temp 97.9°F | Ht 74.0 in | Wt 261.0 lb

## 2023-11-24 DIAGNOSIS — Z125 Encounter for screening for malignant neoplasm of prostate: Secondary | ICD-10-CM | POA: Diagnosis not present

## 2023-11-24 DIAGNOSIS — F32A Depression, unspecified: Secondary | ICD-10-CM | POA: Diagnosis not present

## 2023-11-24 DIAGNOSIS — Z7984 Long term (current) use of oral hypoglycemic drugs: Secondary | ICD-10-CM | POA: Diagnosis not present

## 2023-11-24 DIAGNOSIS — K219 Gastro-esophageal reflux disease without esophagitis: Secondary | ICD-10-CM

## 2023-11-24 DIAGNOSIS — Z23 Encounter for immunization: Secondary | ICD-10-CM | POA: Diagnosis not present

## 2023-11-24 DIAGNOSIS — E559 Vitamin D deficiency, unspecified: Secondary | ICD-10-CM

## 2023-11-24 DIAGNOSIS — N183 Chronic kidney disease, stage 3 unspecified: Secondary | ICD-10-CM | POA: Diagnosis not present

## 2023-11-24 DIAGNOSIS — E1122 Type 2 diabetes mellitus with diabetic chronic kidney disease: Secondary | ICD-10-CM | POA: Diagnosis not present

## 2023-11-24 DIAGNOSIS — N1831 Chronic kidney disease, stage 3a: Secondary | ICD-10-CM | POA: Diagnosis not present

## 2023-11-24 DIAGNOSIS — E538 Deficiency of other specified B group vitamins: Secondary | ICD-10-CM

## 2023-11-24 DIAGNOSIS — M79645 Pain in left finger(s): Secondary | ICD-10-CM | POA: Diagnosis not present

## 2023-11-24 MED ORDER — OMEPRAZOLE 40 MG PO CPDR: 40.0000 mg | DELAYED_RELEASE_CAPSULE | Freq: Every day | ORAL | 3 refills | Status: DC

## 2023-11-24 NOTE — Patient Instructions (Addendum)
You had the Prevnar 20 pneumonia shot today  Ok to restart the Prilosec 40 mg per day for reflux  Please continue all other medications as before, and refills have been done if requested.  Please have the pharmacy call with any other refills you may need.  Please continue your efforts at being more active, low cholesterol diet, and weight control.  Please keep your appointments with your specialists as you may have planned  You will be contacted regarding the referral for: Hand Surgury for the left 4th finger - you can also use the Voltaren gel for the pain as well  We can hold on further lab testing today  Please make an Appointment to return in 4 months, or sooner if needed, also with Lab Appointment for testing done 3-5 days before at the FIRST FLOOR Lab (so this is for TWO appointments - please see the scheduling desk as you leave)

## 2023-11-24 NOTE — Progress Notes (Signed)
Patient ID: [name], male   DOB: 08-Feb-1956, 67 y.o.   MRN: [id number]        Chief Complaint: follow up HTN, HLD and DM, gerd, left 4th finger mcp pain, depression, ckd3a       HPI:  [name] is a 67 y.o. male here overall doing ok.  Pt denies chest pain, increased sob or doe, wheezing, orthopnea, PND, increased LE swelling, palpitations, dizziness or syncope.   Pt denies polydipsia, polyuria, or new focal neuro s/s.    Pt denies fever, wt loss, night sweats, loss of appetite, or other constitutional symptoms  Does have 1 wk onset pain to left 4th mcp and prox finger without swelling or trauma.  Plans to be more active in the gym soon, does not want further OHA increase. Has increased wt with less active recently now with worsening reflux, but Denies worsening other abd pain, dysphagia, n/v, bowel change or blood..    Due for prevnar 20 today Wt Readings from Last 3 Encounters:  11/24/23 261 lb (118.4 kg)  10/31/23 260 lb (117.9 kg)  09/13/23 252 lb (114.3 kg)   BP Readings from Last 3 Encounters:  11/24/23 120/66  10/31/23 124/76  09/13/23 130/80         Past Medical History:  Diagnosis Date   Arthritis    BACK PAIN    CAD (coronary artery disease) 06/28/2017   CAD in native artery    a. moderate by cardiac CT 2016.   [facility])    Chronic bilateral low back pain with bilateral sciatica 01/12/2019   Degenerative arthritis of right knee 07/15/2016   Injected in 07/15/2016 Orthovisc injection started 10/07/2016 DepoMedrol 40 inj on 04/11/17, 25 cc aspirated Orthovisc injection in 05/30/2017     DEGENERATIVE JOINT DISEASE, KNEES, BILATERAL    Depression    Diabetes (HCC) 10/01/2014   Diabetes mellitus    type II   DIVERTICULOSIS, COLON    DVT, lower extremity (HCC)    right   GERD (gastroesophageal reflux disease)    History of kidney stones    Hyperlipidemia    Hypertension    HYPOGONADISM, MALE    HYPOTENSION, ORTHOSTATIC    Kidney stones    Long term  (current) use of anticoagulants 08/17/2017   Long term current use of anticoagulant    Lumbosacral radiculopathy at S1 06/14/2012   Maxillary sinus cyst 12/11/2018   Obesity    OBESITY, MORBID 03/04/2008   Qualifier: Diagnosis of  By: Jonny Ruiz MD, Len Blalock    OSA (obstructive sleep apnea) 09/11/2013   Pulmonary emboli (HCC)    Pulmonary embolism (HCC) 02/05/2011   RBBB 07/28/2017   Renal insufficiency    a. prior h/o, does not appear chronic.   Superficial phlebitis of arm 03/18/2016   Vitamin D deficiency 07/09/2020   Past Surgical History:  Procedure Laterality Date   ankle surgury  2001 bilateral   with bone spurs and torn tendon   COLONOSCOPY  03/18/2010   EDG  2008   chronic Duodenitis   FLEXIBLE SIGMOIDOSCOPY N/A 11/03/2021   Procedure: FLEXIBLE SIGMOIDOSCOPY;  Surgeon: Sherrilyn Rist, MD;  Location: [facility];  Service: Gastroenterology;  Laterality: N/A;   GASTRIC BYPASS     HEEL SPUR SURGERY Bilateral 2005   IR ANGIOGRAM PELVIS SELECTIVE OR SUPRASELECTIVE  11/02/2021   IR ANGIOGRAM PELVIS SELECTIVE OR SUPRASELECTIVE  11/04/2021   IR ANGIOGRAM SELECTIVE EACH ADDITIONAL VESSEL  11/02/2021   IR ANGIOGRAM SELECTIVE EACH  ADDITIONAL VESSEL  11/02/2021   IR ANGIOGRAM SELECTIVE EACH ADDITIONAL VESSEL  11/02/2021   IR ANGIOGRAM SELECTIVE EACH ADDITIONAL VESSEL  11/04/2021   IR ANGIOGRAM SELECTIVE EACH ADDITIONAL VESSEL  11/04/2021   IR ANGIOGRAM SELECTIVE EACH ADDITIONAL VESSEL  11/04/2021   IR ANGIOGRAM SELECTIVE EACH ADDITIONAL VESSEL  11/04/2021   IR ANGIOGRAM VISCERAL SELECTIVE  11/02/2021   IR ANGIOGRAM VISCERAL SELECTIVE  11/04/2021   IR EMBO ART  VEN HEMORR LYMPH EXTRAV  INC GUIDE ROADMAPPING  11/02/2021   IR EMBO ART  VEN HEMORR LYMPH EXTRAV  INC GUIDE ROADMAPPING  11/04/2021   IR EMBO ART  VEN HEMORR LYMPH EXTRAV  INC GUIDE ROADMAPPING  11/04/2021   IR RADIOLOGIST EVAL & MGMT  06/01/2023   IR US GUIDE VASC ACCESS RIGHT  11/02/2021   IR US GUIDE VASC ACCESS RIGHT   11/04/2021   knee surgury  2000 & 2003    cartilage damage   LEFT HEART CATH AND CORONARY ANGIOGRAPHY N/A 02/16/2021   Procedure: LEFT HEART CATH AND CORONARY ANGIOGRAPHY;  Surgeon: Tonny Bollman, MD;  Location: [facility] INVASIVE CV LAB;  Service: Cardiovascular;  Laterality: N/A;   LUMBAR LAMINECTOMY/DECOMPRESSION MICRODISCECTOMY  09/28/2012   Procedure: LUMBAR LAMINECTOMY/DECOMPRESSION MICRODISCECTOMY 2 LEVELS;  Surgeon: Drucilla Schmidt, MD;  Location: [facility];  Service: Orthopedics;  Laterality: Left;  Decompressive laminectomy L4-L5. Microdiscectomy L5-S1   Skin removal surgery     extra abdominal skin removed from his weight loss   TONSILLECTOMY AND ADENOIDECTOMY  age 68 or 6   TOTAL KNEE ARTHROPLASTY  01/10/2012   Procedure: TOTAL KNEE ARTHROPLASTY;  Surgeon: Shelda Pal, MD;  Location: [facility];  Service: Orthopedics;  Laterality: Left;   TOTAL KNEE ARTHROPLASTY Right 11/18/2020   Procedure: TOTAL KNEE ARTHROPLASTY;  Surgeon: Durene Romans, MD;  Location: [facility];  Service: Orthopedics;  Laterality: Right;  70 mins   UPPER GASTROINTESTINAL ENDOSCOPY  03/18/2010    reports that he quit smoking about 45 years ago. His smoking use included cigarettes. He started smoking about 50 years ago. He has a 1.3 pack-year smoking history. He has never used smokeless tobacco. He reports current alcohol use of about 2.0 standard drinks of alcohol per week. He reports that he does not use drugs. family history includes Diabetes in his brother, sister, sister, and sister; Heart failure in his sister; Hypertension in his mother; Prostate cancer in his father. Allergies  Allergen Reactions   Adhesive [Tape] Other (See Comments)    Painful, Please use "paper" tape   Other     NO BLOOD -JEHOVAH'S WITNESS-SIGNED REFUSAL BUT WOULD TAKE ALBUMIN   Current Outpatient Medications on File Prior to Visit  Medication Sig Dispense Refill   Artificial Tear Ointment (ARTIFICIAL TEARS) ointment Place 1 drop into both eyes  every 4 (four) hours as needed (dry eyes).      atorvastatin (LIPITOR) 80 MG tablet TAKE 1 TABLET BY MOUTH EVERY DAY 90 tablet 3   chlorthalidone (HYGROTON) 25 MG tablet Take 1 tablet (25 mg total) by mouth daily. 90 tablet 3   Cholecalciferol (VITAMIN D3) 125 MCG (5000 UT) CAPS Take 5,000 Units by mouth daily.      ferrous sulfate 325 (65 FE) MG EC tablet Take 1 tablet by mouth daily.     ferrous sulfate 325 (65 FE) MG EC tablet TAKE 1 TABLET BY MOUTH EVERY DAY 90 tablet 1   folic acid (FOLVITE) 1 MG tablet TAKE 1 TABLET BY MOUTH EVERY DAY 90 tablet 1  glipiZIDE (GLUCOTROL XL) 2.5 MG 24 hr tablet TAKE 1 TABLET BY MOUTH EVERY DAY WITH BREAKFAST 90 tablet 3   Lidocaine-Hydrocort, Perianal, 3-0.5 % CREA Use as directed three times per day as needed 28.3 g 1   metFORMIN (GLUCOPHAGE-XR) 500 MG 24 hr tablet Take 4 tablets (2,000 mg total) by mouth daily with breakfast. 360 tablet 3   mirtazapine (REMERON) 15 MG tablet TAKE 1 TABLET BY MOUTH EVERYDAY AT BEDTIME 90 tablet 0   Multiple Vitamin (MULTIVITAMIN WITH MINERALS) TABS tablet Take 1 tablet by mouth daily at 12 noon.     nitroGLYCERIN (NITROSTAT) 0.4 MG SL tablet Place 1 tablet (0.4 mg total) under the tongue every 5 (five) minutes as needed for chest pain. 90 tablet 3   ondansetron (ZOFRAN) 4 MG tablet Take 1 tablet (4 mg total) by mouth every 8 (eight) hours as needed for nausea or vomiting. 30 tablet 3   ONETOUCH ULTRA TEST test strip USE AS INSTRUCTED 100 strip 12   vitamin B-12 1000 MCG tablet Take 1 tablet (1,000 mcg total) by mouth daily. 30 tablet 1   No current facility-administered medications on file prior to visit.        ROS:  All others reviewed and negative.  Objective        PE:  BP 120/66 (BP Location: Right Arm, Patient Position: Sitting, Cuff Size: Normal)   Pulse (!) 50   Temp 97.9 F (36.6 C) (Oral)   Ht 6\' 2"  (1.88 m)   Wt 261 lb (118.4 kg)   SpO2 99%   BMI 33.51 kg/m                 Constitutional: Pt appears  in NAD               HENT: Head: NCAT.                Right Ear: External ear normal.                 Left Ear: External ear normal.                Eyes: . Pupils are equal, round, and reactive to light. Conjunctivae and EOM are normal               Nose: without d/c or deformity               Neck: Neck supple. Gross normal ROM               Cardiovascular: Normal rate and regular rhythm.                 Pulmonary/Chest: Effort normal and breath sounds without rales or wheezing.                Abd:  Soft, NT, ND, + BS, no organomegaly               Neurological: Pt is alert. At baseline orientation, motor grossly intact               Skin: Skin is warm. No rashes, no other new lesions, LE edema - none               Psychiatric: Pt behavior is normal without agitation   Micro: none  Cardiac tracings I have personally interpreted today:  none  Pertinent Radiological findings (summarize): none   Lab Results  Component Value Date   WBC 5.9 10/31/2023  HGB 12.2 (L) 10/31/2023   HCT 38.1 (L) 10/31/2023   PLT 173 10/31/2023   GLUCOSE 172 (H) 09/13/2023   CHOL 98 09/13/2023   TRIG 60.0 09/13/2023   HDL 42.10 09/13/2023   LDLDIRECT 116.0 01/15/2020   LDLCALC 43 09/13/2023   ALT 29 09/13/2023   AST 20 09/13/2023   NA 138 09/13/2023   K 4.5 09/13/2023   CL 102 09/13/2023   CREATININE 1.42 09/13/2023   BUN 22 09/13/2023   CO2 27 09/13/2023   TSH 2.62 06/22/2023   PSA 0.78 09/13/2023   INR 1.2 (H) 06/22/2023   HGBA1C 7.2 (H) 09/13/2023   MICROALBUR <0.7 06/22/2023   Assessment/Plan:  [name] is a 67 y.o. Black or African American [2] male with  has a past medical history of Arthritis, BACK PAIN, CAD (coronary artery disease) (06/28/2017), CAD in native artery, Cancer (HCC), Chronic bilateral low back pain with bilateral sciatica (01/12/2019), Degenerative arthritis of right knee (07/15/2016), DEGENERATIVE JOINT DISEASE, KNEES, BILATERAL, Depression, Diabetes (HCC)  (10/01/2014), Diabetes mellitus, DIVERTICULOSIS, COLON, DVT, lower extremity (HCC), GERD (gastroesophageal reflux disease), History of kidney stones, Hyperlipidemia, Hypertension, HYPOGONADISM, MALE, HYPOTENSION, ORTHOSTATIC, Kidney stones, Long term (current) use of anticoagulants (08/17/2017), Long term current use of anticoagulant, Lumbosacral radiculopathy at S1 (06/14/2012), Maxillary sinus cyst (12/11/2018), Obesity, OBESITY, MORBID (03/04/2008), OSA (obstructive sleep apnea) (09/11/2013), Pulmonary emboli (HCC), Pulmonary embolism (HCC) (02/05/2011), RBBB (07/28/2017), Renal insufficiency, Superficial phlebitis of arm (03/18/2016), and Vitamin D deficiency (07/09/2020).  Depression Stable overall, cont emeron at bedtime asd, declines need for referral for counseling or psychiatry  Diabetes [facility]) Lab Results  Component Value Date   HGBA1C 7.2 (H) 09/13/2023   uncontrolled, pt to continue current medical treatment glucotrol xl 2.5 mg every day, metformin ER 50 mh - 4 every day but declines other change for now, plans to work on diet and excercise   Vitamin D deficiency Last vitamin D Lab Results  Component Value Date   VD25OH 45.75 09/13/2023   Stable, cont oral replacement   B12 deficiency Lab Results  Component Value Date   VITAMINB12 730 09/13/2023   Stable, cont oral replacement - b12 1000 mcg qd   CKD (chronic kidney disease) stage 3, GFR 30-59 ml/min (HCC) Lab Results  Component Value Date   CREATININE 1.42 09/13/2023   Stable overall, cont to avoid nephrotoxins   Finger pain, left Exam benign but for volt gel prn, and refer hand surgury  Esophageal reflux Uncontrolled, worsening with wt gain, for restart prilosec 40 qd  Followup: Return in about 4 months (around 03/24/2024).  Oliver Barre, MD 11/25/2023 9:27 PM [facility]

## 2023-11-25 ENCOUNTER — Encounter: Payer: Self-pay | Admitting: Internal Medicine

## 2023-11-25 DIAGNOSIS — M79645 Pain in left finger(s): Secondary | ICD-10-CM | POA: Insufficient documentation

## 2023-11-25 NOTE — Assessment & Plan Note (Signed)
Lab Results  Component Value Date   HGBA1C 7.2 (H) 09/13/2023   uncontrolled, pt to continue current medical treatment glucotrol xl 2.5 mg every day, metformin ER 50 mh - 4 every day but declines other change for now, plans to work on diet and excercise

## 2023-11-25 NOTE — Assessment & Plan Note (Signed)
Last vitamin D Lab Results  Component Value Date   VD25OH 45.75 09/13/2023   Stable, cont oral replacement

## 2023-11-25 NOTE — Assessment & Plan Note (Signed)
Lab Results  Component Value Date   CREATININE 1.42 09/13/2023   Stable overall, cont to avoid nephrotoxins

## 2023-11-25 NOTE — Assessment & Plan Note (Signed)
Uncontrolled, worsening with wt gain, for restart prilosec 40 qd

## 2023-11-25 NOTE — Assessment & Plan Note (Signed)
Stable overall, cont emeron at bedtime asd, declines need for referral for counseling or psychiatry

## 2023-11-25 NOTE — Assessment & Plan Note (Signed)
Lab Results  Component Value Date   VITAMINB12 730 09/13/2023   Stable, cont oral replacement - b12 1000 mcg qd

## 2023-11-25 NOTE — Assessment & Plan Note (Signed)
Exam benign but for volt gel prn, and refer hand surgury

## 2023-12-13 DIAGNOSIS — C61 Malignant neoplasm of prostate: Secondary | ICD-10-CM | POA: Diagnosis not present

## 2023-12-20 DIAGNOSIS — F5221 Male erectile disorder: Secondary | ICD-10-CM | POA: Diagnosis not present

## 2023-12-20 DIAGNOSIS — Z8546 Personal history of malignant neoplasm of prostate: Secondary | ICD-10-CM | POA: Diagnosis not present

## 2023-12-23 DIAGNOSIS — M65342 Trigger finger, left ring finger: Secondary | ICD-10-CM | POA: Diagnosis not present

## 2023-12-23 DIAGNOSIS — M79641 Pain in right hand: Secondary | ICD-10-CM | POA: Diagnosis not present

## 2023-12-23 DIAGNOSIS — M79642 Pain in left hand: Secondary | ICD-10-CM | POA: Diagnosis not present

## 2023-12-25 DIAGNOSIS — M65342 Trigger finger, left ring finger: Secondary | ICD-10-CM | POA: Insufficient documentation

## 2023-12-28 ENCOUNTER — Ambulatory Visit: Payer: PPO | Admitting: Internal Medicine

## 2024-01-07 ENCOUNTER — Emergency Department (HOSPITAL_COMMUNITY): Payer: PPO

## 2024-01-07 ENCOUNTER — Other Ambulatory Visit: Payer: Self-pay

## 2024-01-07 ENCOUNTER — Ambulatory Visit (INDEPENDENT_AMBULATORY_CARE_PROVIDER_SITE_OTHER)
Admission: EM | Admit: 2024-01-07 | Discharge: 2024-01-07 | Disposition: A | Discharge status: Other Acute Inpatient Hospital | Payer: PPO | Source: Home / Self Care

## 2024-01-07 ENCOUNTER — Encounter: Payer: Self-pay | Admitting: Emergency Medicine

## 2024-01-07 ENCOUNTER — Inpatient Hospital Stay (HOSPITAL_COMMUNITY)
Admission: EM | Admit: 2024-01-07 | Discharge: 2024-01-09 | DRG: 384 | Disposition: A | Discharge status: Home / Self Care | Payer: PPO | Attending: Internal Medicine | Admitting: Internal Medicine

## 2024-01-07 DIAGNOSIS — I2510 Atherosclerotic heart disease of native coronary artery without angina pectoris: Secondary | ICD-10-CM | POA: Diagnosis not present

## 2024-01-07 DIAGNOSIS — K259 Gastric ulcer, unspecified as acute or chronic, without hemorrhage or perforation: Secondary | ICD-10-CM | POA: Diagnosis not present

## 2024-01-07 DIAGNOSIS — E119 Type 2 diabetes mellitus without complications: Secondary | ICD-10-CM | POA: Diagnosis present

## 2024-01-07 DIAGNOSIS — Z1152 Encounter for screening for COVID-19: Secondary | ICD-10-CM

## 2024-01-07 DIAGNOSIS — Z79899 Other long term (current) drug therapy: Secondary | ICD-10-CM

## 2024-01-07 DIAGNOSIS — I201 Angina pectoris with documented spasm: Secondary | ICD-10-CM | POA: Diagnosis not present

## 2024-01-07 DIAGNOSIS — I200 Unstable angina: Secondary | ICD-10-CM | POA: Diagnosis not present

## 2024-01-07 DIAGNOSIS — Z91048 Other nonmedicinal substance allergy status: Secondary | ICD-10-CM

## 2024-01-07 DIAGNOSIS — R079 Chest pain, unspecified: Secondary | ICD-10-CM | POA: Diagnosis present

## 2024-01-07 DIAGNOSIS — Z8249 Family history of ischemic heart disease and other diseases of the circulatory system: Secondary | ICD-10-CM

## 2024-01-07 DIAGNOSIS — R001 Bradycardia, unspecified: Secondary | ICD-10-CM | POA: Diagnosis not present

## 2024-01-07 DIAGNOSIS — K3189 Other diseases of stomach and duodenum: Secondary | ICD-10-CM | POA: Diagnosis not present

## 2024-01-07 DIAGNOSIS — K828 Other specified diseases of gallbladder: Secondary | ICD-10-CM | POA: Diagnosis not present

## 2024-01-07 DIAGNOSIS — R112 Nausea with vomiting, unspecified: Principal | ICD-10-CM | POA: Diagnosis present

## 2024-01-07 DIAGNOSIS — R1011 Right upper quadrant pain: Secondary | ICD-10-CM | POA: Diagnosis not present

## 2024-01-07 DIAGNOSIS — R0789 Other chest pain: Secondary | ICD-10-CM | POA: Diagnosis present

## 2024-01-07 DIAGNOSIS — Z980 Intestinal bypass and anastomosis status: Secondary | ICD-10-CM | POA: Diagnosis not present

## 2024-01-07 DIAGNOSIS — R1110 Vomiting, unspecified: Secondary | ICD-10-CM | POA: Diagnosis not present

## 2024-01-07 DIAGNOSIS — K8020 Calculus of gallbladder without cholecystitis without obstruction: Secondary | ICD-10-CM | POA: Diagnosis not present

## 2024-01-07 DIAGNOSIS — R1013 Epigastric pain: Secondary | ICD-10-CM | POA: Diagnosis present

## 2024-01-07 DIAGNOSIS — Z923 Personal history of irradiation: Secondary | ICD-10-CM

## 2024-01-07 DIAGNOSIS — Z8042 Family history of malignant neoplasm of prostate: Secondary | ICD-10-CM

## 2024-01-07 DIAGNOSIS — I700 Atherosclerosis of aorta: Secondary | ICD-10-CM | POA: Diagnosis not present

## 2024-01-07 DIAGNOSIS — I10 Essential (primary) hypertension: Secondary | ICD-10-CM | POA: Diagnosis present

## 2024-01-07 DIAGNOSIS — R9389 Abnormal findings on diagnostic imaging of other specified body structures: Secondary | ICD-10-CM | POA: Diagnosis not present

## 2024-01-07 DIAGNOSIS — Z87891 Personal history of nicotine dependence: Secondary | ICD-10-CM

## 2024-01-07 DIAGNOSIS — E785 Hyperlipidemia, unspecified: Secondary | ICD-10-CM | POA: Diagnosis present

## 2024-01-07 DIAGNOSIS — Z86718 Personal history of other venous thrombosis and embolism: Secondary | ICD-10-CM

## 2024-01-07 DIAGNOSIS — Z8672 Personal history of thrombophlebitis: Secondary | ICD-10-CM

## 2024-01-07 DIAGNOSIS — Z9884 Bariatric surgery status: Secondary | ICD-10-CM

## 2024-01-07 DIAGNOSIS — Z8546 Personal history of malignant neoplasm of prostate: Secondary | ICD-10-CM

## 2024-01-07 DIAGNOSIS — Q438 Other specified congenital malformations of intestine: Secondary | ICD-10-CM | POA: Diagnosis not present

## 2024-01-07 DIAGNOSIS — Z7901 Long term (current) use of anticoagulants: Secondary | ICD-10-CM

## 2024-01-07 DIAGNOSIS — N183 Chronic kidney disease, stage 3 unspecified: Secondary | ICD-10-CM | POA: Diagnosis not present

## 2024-01-07 DIAGNOSIS — Z7984 Long term (current) use of oral hypoglycemic drugs: Secondary | ICD-10-CM

## 2024-01-07 DIAGNOSIS — Z96651 Presence of right artificial knee joint: Secondary | ICD-10-CM | POA: Diagnosis present

## 2024-01-07 DIAGNOSIS — Z87442 Personal history of urinary calculi: Secondary | ICD-10-CM

## 2024-01-07 DIAGNOSIS — E860 Dehydration: Secondary | ICD-10-CM | POA: Diagnosis present

## 2024-01-07 DIAGNOSIS — Z86711 Personal history of pulmonary embolism: Secondary | ICD-10-CM

## 2024-01-07 DIAGNOSIS — K219 Gastro-esophageal reflux disease without esophagitis: Secondary | ICD-10-CM | POA: Diagnosis present

## 2024-01-07 DIAGNOSIS — I129 Hypertensive chronic kidney disease with stage 1 through stage 4 chronic kidney disease, or unspecified chronic kidney disease: Secondary | ICD-10-CM | POA: Diagnosis not present

## 2024-01-07 DIAGNOSIS — Z833 Family history of diabetes mellitus: Secondary | ICD-10-CM

## 2024-01-07 LAB — CBC
HCT: 43.2 % (ref 39.0–52.0)
Hemoglobin: 13 g/dL (ref 13.0–17.0)
MCH: 22.3 pg — ABNORMAL LOW (ref 26.0–34.0)
MCHC: 30.1 g/dL (ref 30.0–36.0)
MCV: 74 fL — ABNORMAL LOW (ref 80.0–100.0)
Platelets: 185 10*3/uL (ref 150–400)
RBC: 5.84 MIL/uL — ABNORMAL HIGH (ref 4.22–5.81)
RDW: 15.9 % — ABNORMAL HIGH (ref 11.5–15.5)
WBC: 6 10*3/uL (ref 4.0–10.5)
nRBC: 0 % (ref 0.0–0.2)

## 2024-01-07 LAB — BASIC METABOLIC PANEL
Anion gap: 12 (ref 5–15)
BUN: 26 mg/dL — ABNORMAL HIGH (ref 8–23)
CO2: 23 mmol/L (ref 22–32)
Calcium: 9.3 mg/dL (ref 8.9–10.3)
Chloride: 104 mmol/L (ref 98–111)
Creatinine, Ser: 1.59 mg/dL — ABNORMAL HIGH (ref 0.61–1.24)
GFR, Estimated: 46 mL/min — ABNORMAL LOW (ref >60)
Glucose, Bld: 152 mg/dL — ABNORMAL HIGH (ref 70–99)
Potassium: 4.7 mmol/L (ref 3.5–5.1)
Sodium: 139 mmol/L (ref 135–145)

## 2024-01-07 LAB — HEPATIC FUNCTION PANEL
ALT: 38 U/L (ref 0–44)
AST: 26 U/L (ref 15–41)
Albumin: 4.1 g/dL (ref 3.5–5.0)
Alkaline Phosphatase: 81 U/L (ref 38–126)
Bilirubin, Direct: 0.1 mg/dL (ref 0.0–0.2)
Indirect Bilirubin: 0.3 mg/dL (ref 0.3–0.9)
Total Bilirubin: 0.4 mg/dL (ref 0.0–1.2)
Total Protein: 7.1 g/dL (ref 6.5–8.1)

## 2024-01-07 LAB — GLUCOSE, CAPILLARY: Glucose-Capillary: 99 mg/dL (ref 70–99)

## 2024-01-07 LAB — RESP PANEL BY RT-PCR (RSV, FLU A&B, COVID)  RVPGX2
Influenza A by PCR: NEGATIVE
Influenza B by PCR: NEGATIVE
Resp Syncytial Virus by PCR: NEGATIVE
SARS Coronavirus 2 by RT PCR: NEGATIVE

## 2024-01-07 LAB — LIPASE, BLOOD: Lipase: 39 U/L (ref 11–51)

## 2024-01-07 LAB — TROPONIN I (HIGH SENSITIVITY)
Troponin I (High Sensitivity): 4 ng/L (ref <18)
Troponin I (High Sensitivity): 4 ng/L (ref <18)

## 2024-01-07 LAB — POC OCCULT BLOOD, ED: Fecal Occult Bld: NEGATIVE

## 2024-01-07 MED ORDER — MORPHINE SULFATE (PF) 4 MG/ML IV SOLN
4.0000 mg | Freq: Once | INTRAVENOUS | Status: AC
Start: 2024-01-07 — End: 2024-01-07
  Administered 2024-01-07: 4 mg via INTRAVENOUS
  Filled 2024-01-07: qty 1

## 2024-01-07 MED ORDER — ATORVASTATIN CALCIUM 80 MG PO TABS
80.0000 mg | ORAL_TABLET | Freq: Every day | ORAL | Status: DC
  Administered 2024-01-08: 80 mg via ORAL
  Filled 2024-01-07: qty 1

## 2024-01-07 MED ORDER — INSULIN ASPART 100 UNIT/ML IJ SOLN
0.0000 [IU] | Freq: Three times a day (TID) | INTRAMUSCULAR | Status: DC
  Administered 2024-01-08: 1 [IU] via SUBCUTANEOUS

## 2024-01-07 MED ORDER — HYDROMORPHONE HCL 1 MG/ML IJ SOLN
0.5000 mg | Freq: Once | INTRAMUSCULAR | Status: AC
Start: 2024-01-07 — End: 2024-01-07

## 2024-01-07 MED ORDER — LACTATED RINGERS IV SOLN: INTRAVENOUS | Status: AC

## 2024-01-07 MED ORDER — MELATONIN 3 MG PO TABS: 6.0000 mg | ORAL_TABLET | Freq: Every evening | ORAL | Status: DC | PRN

## 2024-01-07 MED ORDER — ONDANSETRON HCL 4 MG/2ML IJ SOLN
4.0000 mg | Freq: Once | INTRAMUSCULAR | Status: AC
  Administered 2024-01-07: 4 mg via INTRAVENOUS
  Filled 2024-01-07: qty 2

## 2024-01-07 MED ORDER — PANTOPRAZOLE SODIUM 40 MG IV SOLR
40.0000 mg | Freq: Once | INTRAVENOUS | Status: AC
  Administered 2024-01-07: 40 mg via INTRAVENOUS
  Filled 2024-01-07: qty 10

## 2024-01-07 MED ORDER — PROMETHAZINE HCL 25 MG/ML IJ SOLN
12.5000 mg | Freq: Four times a day (QID) | INTRAMUSCULAR | Status: DC | PRN
  Administered 2024-01-07: 12.5 mg via INTRAVENOUS
  Filled 2024-01-07: qty 0.5

## 2024-01-07 MED ORDER — HYDROMORPHONE HCL 1 MG/ML IJ SOLN
0.5000 mg | Freq: Once | INTRAMUSCULAR | Status: AC
  Administered 2024-01-07: 0.5 mg via INTRAVENOUS
  Filled 2024-01-07: qty 1

## 2024-01-07 MED ORDER — IOHEXOL 350 MG/ML SOLN
60.0000 mL | Freq: Once | INTRAVENOUS | Status: AC | PRN
  Administered 2024-01-07: 60 mL via INTRAVENOUS

## 2024-01-07 MED ORDER — PANTOPRAZOLE SODIUM 40 MG PO TBEC
40.0000 mg | DELAYED_RELEASE_TABLET | Freq: Two times a day (BID) | ORAL | Status: DC
  Administered 2024-01-08 (×2): 40 mg via ORAL
  Filled 2024-01-07 (×2): qty 1

## 2024-01-07 MED ORDER — VITAMIN B-12 1000 MCG PO TABS
1000.0000 ug | ORAL_TABLET | Freq: Every day | ORAL | Status: DC
  Administered 2024-01-08: 1000 ug via ORAL
  Filled 2024-01-07: qty 1

## 2024-01-07 MED ORDER — POLYETHYLENE GLYCOL 3350 17 G PO PACK: 17.0000 g | PACK | Freq: Every day | ORAL | Status: DC | PRN

## 2024-01-07 MED ORDER — ACETAMINOPHEN 500 MG PO TABS: 1000.0000 mg | ORAL_TABLET | Freq: Four times a day (QID) | ORAL | Status: DC | PRN

## 2024-01-07 MED ORDER — PROCHLORPERAZINE EDISYLATE 10 MG/2ML IJ SOLN: 10.0000 mg | Freq: Four times a day (QID) | INTRAMUSCULAR | Status: DC | PRN

## 2024-01-07 MED ORDER — SODIUM CHLORIDE 0.9 % IV BOLUS
1000.0000 mL | Freq: Once | INTRAVENOUS | Status: AC
  Administered 2024-01-07: 1000 mL via INTRAVENOUS

## 2024-01-07 MED ORDER — ONDANSETRON HCL 4 MG/2ML IJ SOLN: 4.0000 mg | Freq: Four times a day (QID) | INTRAMUSCULAR | Status: DC | PRN

## 2024-01-07 MED ORDER — FOLIC ACID 1 MG PO TABS
1.0000 mg | ORAL_TABLET | Freq: Every day | ORAL | Status: DC
  Administered 2024-01-08: 1 mg via ORAL
  Filled 2024-01-07: qty 1

## 2024-01-07 NOTE — ED Provider Notes (Signed)
Seen from previous provider.  See note for full HPI.  In summation 68 year old initially came in for chest pain.  Had unremarkable workup thus far aside from a CT scan which showed some possible stones in the gallbladder with gallbladder distention, they had recommended ultrasound.  Will plan on following up on this.  He has had some persistent nausea and pain.  Will follow-up on this as well Physical Exam  BP 116/72   Pulse (!) 56   Temp 98.1 F (36.7 C) (Oral)   Resp 14   SpO2 92%   Physical Exam Vitals and nursing note reviewed.  Constitutional:      General: He is not in acute distress.    Appearance: He is well-developed. He is not ill-appearing, toxic-appearing or diaphoretic.  HENT:     Head: Atraumatic.  Eyes:     Pupils: Pupils are equal, round, and reactive to light.  Cardiovascular:     Rate and Rhythm: Normal rate and regular rhythm.     Pulses: Normal pulses.     Heart sounds: Normal heart sounds.  Pulmonary:     Effort: Pulmonary effort is normal. No respiratory distress.     Breath sounds: Normal breath sounds.  Abdominal:     General: Bowel sounds are normal. There is no distension.     Palpations: Abdomen is soft. There is no mass.     Tenderness: There is abdominal tenderness in the epigastric area. There is no right CVA tenderness, left CVA tenderness, guarding or rebound.    Genitourinary:    Comments: Male RN chaperone in room.  Light brown stool in rectal vault.  Occult negative Musculoskeletal:        General: Normal range of motion.     Cervical back: Normal range of motion and neck supple.  Skin:    General: Skin is warm and dry.  Neurological:     General: No focal deficit present.     Mental Status: He is alert and oriented to person, place, and time.     Procedures  Procedures Labs Reviewed  BASIC METABOLIC PANEL - Abnormal; Notable for the following components:      Result Value   Glucose, Bld 152 (*)    BUN 26 (*)    Creatinine, Ser  1.59 (*)    GFR, Estimated 46 (*)    All other components within normal limits  CBC - Abnormal; Notable for the following components:   RBC 5.84 (*)    MCV 74.0 (*)    MCH 22.3 (*)    RDW 15.9 (*)    All other components within normal limits  RESP PANEL BY RT-PCR (RSV, FLU A&B, COVID)  RVPGX2  LIPASE, BLOOD  HEPATIC FUNCTION PANEL  HIV ANTIBODY (ROUTINE TESTING W REFLEX)  BASIC METABOLIC PANEL  CBC  MAGNESIUM  PHOSPHORUS  HEPATIC FUNCTION PANEL  POC OCCULT BLOOD, ED  TROPONIN I (HIGH SENSITIVITY)  TROPONIN I (HIGH SENSITIVITY)    US Abdomen Limited RUQ (LIVER/GB) Result Date: 01/07/2024 CLINICAL DATA:  Right upper quadrant abdominal pain. EXAM: ULTRASOUND ABDOMEN LIMITED RIGHT UPPER QUADRANT COMPARISON:  CT scan of same day. FINDINGS: Gallbladder: No gallstones or wall thickening visualized. No sonographic Murphy sign noted by sonographer. Common bile duct: Diameter: 3 mm which is within normal limits. Liver: No focal lesion identified. Within normal limits in parenchymal echogenicity. Portal vein is patent on color Doppler imaging with normal direction of blood flow towards the liver. Other: None. IMPRESSION: No definite abnormality seen  in the right upper quadrant of the abdomen. Electronically Signed   By: Lupita Raider M.D.   On: 01/07/2024 15:41   CT ABDOMEN PELVIS W CONTRAST Result Date: 01/07/2024 CLINICAL DATA:  Epigastric pain. EXAM: CT ABDOMEN AND PELVIS WITH CONTRAST TECHNIQUE: Multidetector CT imaging of the abdomen and pelvis was performed using the standard protocol following bolus administration of intravenous contrast. RADIATION DOSE REDUCTION: This exam was performed according to the departmental dose-optimization program which includes automated exposure control, adjustment of the mA and/or kV according to patient size and/or use of iterative reconstruction technique. CONTRAST:  60mL OMNIPAQUE IOHEXOL 350 MG/ML SOLN COMPARISON:  CT angiogram 06/25/2023. FINDINGS: Lower  chest: There is some linear opacity lung bases likely scar or atelectasis. No pleural effusion. Mild ground-glass as well. Coronary artery calcifications are seen. Patulous esophagus with air and fluid. Hepatobiliary: Dilated gallbladder with some stones. No wall thickening. Preserved hepatic parenchyma. Patent portal vein. Pancreas: Unremarkable. No pancreatic ductal dilatation or surrounding inflammatory changes. Spleen: Normal in size without focal abnormality. Adrenals/Urinary Tract: Slight nodular thickening of the adrenal glands, unchanged from previous. No enhancing renal mass or collecting system dilatation. Underdistended urinary bladder. Bladder wall slightly thickened. Stomach/Bowel: Surgical changes from gastric bypass with a gastrojejunostomy. The excluded portion of the stomach is with some mild luminal fluid. Small bowel is nondilated on this non oral contrast exam. Large bowel has a normal caliber with scattered colonic stool. Redundant course to the colon diffusely particularly the sigmoid colon and transverse colon. Normal caliber appendix. Vascular/Lymphatic: Aortic atherosclerosis. No enlarged abdominal or pelvic lymph nodes. Reproductive: Prostate is unremarkable. Other: No free air or free fluid.  Mild anasarca. Musculoskeletal: Mild degenerative changes along the spine. IMPRESSION: Redundant course of the colon with diffuse stool. Normal appendix. No obstruction. Distended gallbladder with dependent stones. If there is further concern of acute gallbladder pathology ultrasound may be useful as the next step in the workup. Patulous esophagus.  Gastric bypass with a gastrojejunostomy. Electronically Signed   By: Karen Kays M.D.   On: 01/07/2024 14:25   DG Chest 2 View Result Date: 01/07/2024 CLINICAL DATA:  Chest pain EXAM: CHEST - 2 VIEW COMPARISON:  03/28/2022 FINDINGS: Mild left hemidiaphragm elevation. Midline trachea. Normal heart size. Atherosclerosis in the transverse aorta. No pleural  effusion or pneumothorax. Clear lungs. IMPRESSION: Aortic Atherosclerosis (ICD10-I70.0). No active cardiopulmonary disease. Electronically Signed   By: Jeronimo Greaves M.D.   On: 01/07/2024 12:43       Seen from previous provider.  See note for full HPI.  In summation 68 year old initially came in for chest pain.  Had unremarkable workup thus far aside from a CT scan which showed some possible stones in the gallbladder with gallbladder distention, they had recommended ultrasound.  Will plan on following up on this.  He has had some persistent nausea and pain.  Will follow-up on this as well  Labs and imaging personally viewed interpreted No significant abnormality Ultrasound negative for stones, cholelithiasis or cholecystitis    Still have N/V despite 2 rounds of Zofran.  Put in orders for Phenergan.  His pain is more epigastric in nature.  CT scan, ultrasound and chest x-ray are reassuring.  I low suspicion for AAA or dissection.  Symptoms do not seem consistent with unstable angina however given his persistent vomiting will need to be admitted.  Will also start on Protonix.  He states he has had ulcers before.  Previously followed by [facility].  He denies any GI bleed.  Rectal exam here with light brown stool.  Occult negative.  Will admit for further workup and management  Discussed with Dr. Lazarus Salines who agrees to evaluate patient for admission.  Started on Protonix given his history of ulcers according to patient.  Thankfully no evidence of acute GI bleed at this time.  The patient appears reasonably stabilized for admission considering the current resources, flow, and capabilities available in the ED at this time, and I doubt any other [facility] requiring further screening and/or treatment in the ED prior to admission.   Medical Decision Making Amount and/or Complexity of Data Reviewed Independent Historian: spouse External Data Reviewed: labs, radiology, ECG and notes. Labs: ordered.  Decision-making details documented in ED Course. Radiology: ordered and independent interpretation performed. Decision-making details documented in ED Course. ECG/medicine tests: ordered and independent interpretation performed. Decision-making details documented in ED Course.  Risk OTC drugs. Prescription drug management. Parenteral controlled substances. Decision regarding hospitalization. Diagnosis or treatment significantly limited by social determinants of health.          Linwood Dibbles, PA-C 01/07/24 2130    Glendora Score, MD 01/08/24 [phone]

## 2024-01-07 NOTE — H&P (Addendum)
History and Physical    [name] WUJ:[id number] DOB: [birth year] DOA: 01/07/2024  PCP: Corwin Levins, MD   Patient coming from: Home   Chief Complaint: Chest pain / Epigastric pain    HPI:  [name] is a 68 y.o. male, Jehovah's Witness, with hx of remote history CAD with diffuse moderate CAD including prox/mid LAD stenosis, history of PE no longer on anticoagulation after GI bleed, prostate cancer s/p radiation, hypertension, hyperlipidemia, diabetes, Roux-en-Y gastric bypass, GERD, who presents with acute onset of epigastric pain after breakfast associated with profuse vomiting.  Reports that prior to onset of symptoms today that he has been having late 6 months of issues with nausea after taking PO; however on chart review and all GI notes appears he has had more longstanding issues with nausea and weight loss.  Last EGD 7/' 23 was normal.   This morning after eating eggs developed acute onset of severe epigastric pain, nonradiating.  Was going to see urgent care but developed nausea and vomiting.  Reports vomiting at least 10 times.  Not able to describe quality of emesis, denies hematemesis/coffee-ground emesis.  No melena, hematochezia.  No diarrhea, constipation.  Otherwise no recent fever, chills no other recent illness.  Denies taking NSAID type medications.   Although he actually reports his pain as "chest pain" he points to the epigastric region.  He has good exertional tolerance with no chest pain or shortness of breath.  Review of Systems:  ROS complete and negative except as marked above   Allergies  Allergen Reactions   Other Other (See Comments)    NO BLOOD -JEHOVAH'S WITNESS-SIGNED REFUSAL BUT WOULD TAKE ALBUMIN   Adhesive [Tape] Other (See Comments)    Painful, Please use "paper" tape    Prior to Admission medications   Medication Sig Start Date End Date Taking? Authorizing Provider  Artificial Tear Ointment (ARTIFICIAL TEARS) ointment Place 1 drop into  both eyes every 4 (four) hours as needed (dry eyes).    Yes [provider]  atorvastatin (LIPITOR) 80 MG tablet TAKE 1 TABLET BY MOUTH EVERY DAY 08/03/23  Yes Corwin Levins, MD  chlorthalidone (HYGROTON) 25 MG tablet Take 1 tablet (25 mg total) by mouth daily. 09/13/23  Yes Corwin Levins, MD  Cholecalciferol (VITAMIN D3) 125 MCG (5000 UT) CAPS Take 5,000 Units by mouth daily.    Yes [provider]  ferrous sulfate 325 (65 FE) MG EC tablet TAKE 1 TABLET BY MOUTH EVERY DAY 08/24/23  Yes Malachy Mood, MD  folic acid (FOLVITE) 1 MG tablet TAKE 1 TABLET BY MOUTH EVERY DAY 08/06/23  Yes Malachy Mood, MD  glipiZIDE (GLUCOTROL XL) 2.5 MG 24 hr tablet TAKE 1 TABLET BY MOUTH EVERY DAY WITH BREAKFAST 11/12/22  Yes Corwin Levins, MD  metFORMIN (GLUCOPHAGE-XR) 500 MG 24 hr tablet Take 4 tablets (2,000 mg total) by mouth daily with breakfast. 09/13/23  Yes Corwin Levins, MD  Multiple Vitamin (MULTIVITAMIN WITH MINERALS) TABS tablet Take 1 tablet by mouth daily at 12 noon.   Yes [provider]  nitroGLYCERIN (NITROSTAT) 0.4 MG SL tablet Place 1 tablet (0.4 mg total) under the tongue every 5 (five) minutes as needed for chest pain. 07/30/15  Yes Lars Masson, MD  omeprazole (PRILOSEC) 40 MG capsule Take 1 capsule (40 mg total) by mouth daily. 11/24/23  Yes Corwin Levins, MD  vitamin B-12 1000 MCG tablet Take 1 tablet (1,000 mcg total) by mouth daily. 11/08/21  Yes Kathlen Mody, MD  [facility] ULTRA TEST test strip USE AS INSTRUCTED 07/01/23   Corwin Levins, MD    Past Medical History:  Diagnosis Date   Arthritis    BACK PAIN    CAD (coronary artery disease) 06/28/2017   CAD in native artery    a. moderate by cardiac CT 2016.   Cancer [facility])    Chronic bilateral low back pain with bilateral sciatica 01/12/2019   Degenerative arthritis of right knee 07/15/2016   Injected in 07/15/2016 Orthovisc injection started 10/07/2016 DepoMedrol 40 inj on 04/11/17, 25 cc aspirated Orthovisc injection  in 05/30/2017     DEGENERATIVE JOINT DISEASE, KNEES, BILATERAL    Depression    Diabetes (HCC) 10/01/2014   Diabetes mellitus    type II   DIVERTICULOSIS, COLON    DVT, lower extremity (HCC)    right   GERD (gastroesophageal reflux disease)    History of kidney stones    Hyperlipidemia    Hypertension    HYPOGONADISM, MALE    HYPOTENSION, ORTHOSTATIC    Kidney stones    Long term (current) use of anticoagulants 08/17/2017   Long term current use of anticoagulant    Lumbosacral radiculopathy at S1 06/14/2012   Maxillary sinus cyst 12/11/2018   Obesity    OBESITY, MORBID 03/04/2008   Qualifier: Diagnosis of  By: Jonny Ruiz MD, Len Blalock    OSA (obstructive sleep apnea) 09/11/2013   Pulmonary emboli (HCC)    Pulmonary embolism (HCC) 02/05/2011   RBBB 07/28/2017   Renal insufficiency    a. prior h/o, does not appear chronic.   Superficial phlebitis of arm 03/18/2016   Vitamin D deficiency 07/09/2020    Past Surgical History:  Procedure Laterality Date   ankle surgury  2001 bilateral   with bone spurs and torn tendon   COLONOSCOPY  03/18/2010   EDG  2008   chronic Duodenitis   FLEXIBLE SIGMOIDOSCOPY N/A 11/03/2021   Procedure: FLEXIBLE SIGMOIDOSCOPY;  Surgeon: Sherrilyn Rist, MD;  Location: [facility];  Service: Gastroenterology;  Laterality: N/A;   GASTRIC BYPASS     HEEL SPUR SURGERY Bilateral 2005   IR ANGIOGRAM PELVIS SELECTIVE OR SUPRASELECTIVE  11/02/2021   IR ANGIOGRAM PELVIS SELECTIVE OR SUPRASELECTIVE  11/04/2021   IR ANGIOGRAM SELECTIVE EACH ADDITIONAL VESSEL  11/02/2021   IR ANGIOGRAM SELECTIVE EACH ADDITIONAL VESSEL  11/02/2021   IR ANGIOGRAM SELECTIVE EACH ADDITIONAL VESSEL  11/02/2021   IR ANGIOGRAM SELECTIVE EACH ADDITIONAL VESSEL  11/04/2021   IR ANGIOGRAM SELECTIVE EACH ADDITIONAL VESSEL  11/04/2021   IR ANGIOGRAM SELECTIVE EACH ADDITIONAL VESSEL  11/04/2021   IR ANGIOGRAM SELECTIVE EACH ADDITIONAL VESSEL  11/04/2021   IR ANGIOGRAM VISCERAL SELECTIVE   11/02/2021   IR ANGIOGRAM VISCERAL SELECTIVE  11/04/2021   IR EMBO ART  VEN HEMORR LYMPH EXTRAV  INC GUIDE ROADMAPPING  11/02/2021   IR EMBO ART  VEN HEMORR LYMPH EXTRAV  INC GUIDE ROADMAPPING  11/04/2021   IR EMBO ART  VEN HEMORR LYMPH EXTRAV  INC GUIDE ROADMAPPING  11/04/2021   IR RADIOLOGIST EVAL & MGMT  06/01/2023   IR US GUIDE VASC ACCESS RIGHT  11/02/2021   IR US GUIDE VASC ACCESS RIGHT  11/04/2021   knee surgury  2000 & 2003    cartilage damage   LEFT HEART CATH AND CORONARY ANGIOGRAPHY N/A 02/16/2021   Procedure: LEFT HEART CATH AND CORONARY ANGIOGRAPHY;  Surgeon: Tonny Bollman, MD;  Location: [facility] INVASIVE CV LAB;  Service: Cardiovascular;  Laterality:  N/A;   LUMBAR LAMINECTOMY/DECOMPRESSION MICRODISCECTOMY  09/28/2012   Procedure: LUMBAR LAMINECTOMY/DECOMPRESSION MICRODISCECTOMY 2 LEVELS;  Surgeon: Drucilla Schmidt, MD;  Location: [facility];  Service: Orthopedics;  Laterality: Left;  Decompressive laminectomy L4-L5. Microdiscectomy L5-S1   Skin removal surgery     extra abdominal skin removed from his weight loss   TONSILLECTOMY AND ADENOIDECTOMY  age 21 or 6   TOTAL KNEE ARTHROPLASTY  01/10/2012   Procedure: TOTAL KNEE ARTHROPLASTY;  Surgeon: Shelda Pal, MD;  Location: [facility];  Service: Orthopedics;  Laterality: Left;   TOTAL KNEE ARTHROPLASTY Right 11/18/2020   Procedure: TOTAL KNEE ARTHROPLASTY;  Surgeon: Durene Romans, MD;  Location: [facility];  Service: Orthopedics;  Laterality: Right;  70 mins   UPPER GASTROINTESTINAL ENDOSCOPY  03/18/2010     reports that he quit smoking about 45 years ago. His smoking use included cigarettes. He started smoking about 50 years ago. He has a 1.3 pack-year smoking history. He has never used smokeless tobacco. He reports current alcohol use of about 2.0 standard drinks of alcohol per week. He reports that he does not use drugs.  Family History  Problem Relation Age of Onset   Hypertension Mother    Prostate cancer Father    Diabetes Sister         died with DM 13-Feb-2001, 2 more sisters with diabetes   Heart failure Sister    Diabetes Brother    Diabetes Sister    Diabetes Sister    Colon cancer Neg Hx    Colon polyps Neg Hx    Esophageal cancer Neg Hx    Rectal cancer Neg Hx    Stomach cancer Neg Hx      Physical Exam: Vitals:   01/07/24 1313 01/07/24 1518 01/07/24 2015 01/07/24 2017  BP: (!) 166/74 (!) 145/85 129/69   Pulse: 75 (!) 57 (!) 51   Resp:  10 14   Temp: 98.7 F (37.1 C)   98.1 F (36.7 C)  TempSrc: Axillary   Oral  SpO2:  98% 96%     Gen: Awake, alert, NAD   CV: Regular, normal S1, S2, no murmurs  Resp: Normal WOB, CTAB  Abd: Flat, normoactive, mild epigastric tenderness, no rebound, guarding, rigidity.  Negative Murphy's. MSK: Symmetric, no edema  Skin: No rashes or lesions to exposed skin  Neuro: Alert and interactive  Psych: euthymic, appropriate    Data review:   Labs reviewed, notable for:   High-sensitivity troponin negative LFT, lipase normal FOBT negative Blood counts and chemistries otherwise unremarkable, baseline creatinine  Micro:  Results for orders placed or performed during the hospital encounter of 03/28/22  Resp Panel by RT-PCR (Flu A&B, Covid) Nasopharyngeal Swab     Status: Abnormal   Collection Time: 03/28/22  8:21 AM   Specimen: Nasopharyngeal Swab; Nasopharyngeal(NP) swabs in vial transport medium  Result Value Ref Range Status   SARS Coronavirus 2 by RT PCR POSITIVE (A) NEGATIVE Final    Comment: (NOTE) SARS-CoV-2 target nucleic acids are DETECTED.  The SARS-CoV-2 RNA is generally detectable in upper respiratory specimens during the acute phase of infection. Positive results are indicative of the presence of the identified virus, but do not rule out bacterial infection or co-infection with other pathogens not detected by the test. Clinical correlation with patient history and other diagnostic information is necessary to determine patient infection status. The expected  result is Negative.  Fact Sheet for Patients: BloggerCourse.com  Fact Sheet for Healthcare Providers: SeriousBroker.it  This test  is not yet approved or cleared by the [place] and  has been authorized for detection and/or diagnosis of SARS-CoV-2 by FDA under an Emergency Use Authorization (EUA).  This EUA will remain in effect (meaning this test can be used) for the duration of  the COVID-19 declaration under Section 564(b)(1) of the A ct, 21 U.S.C. section 360bbb-3(b)(1), unless the authorization is terminated or revoked sooner.     Influenza A by PCR NEGATIVE NEGATIVE Final   Influenza B by PCR NEGATIVE NEGATIVE Final    Comment: (NOTE) The Xpert Xpress SARS-CoV-2/FLU/RSV plus assay is intended as an aid in the diagnosis of influenza from Nasopharyngeal swab specimens and should not be used as a sole basis for treatment. Nasal washings and aspirates are unacceptable for Xpert Xpress SARS-CoV-2/FLU/RSV testing.  Fact Sheet for Patients: BloggerCourse.com  Fact Sheet for Healthcare Providers: SeriousBroker.it  This test is not yet approved or cleared by the [place] FDA and has been authorized for detection and/or diagnosis of SARS-CoV-2 by FDA under an Emergency Use Authorization (EUA). This EUA will remain in effect (meaning this test can be used) for the duration of the COVID-19 declaration under Section 564(b)(1) of the Act, 21 U.S.C. section 360bbb-3(b)(1), unless the authorization is terminated or revoked.  Performed at [facility], [address] [place], [place] [address]    *Note: Due to a large number of results and/or encounters for the requested time period, some results have not been displayed. A complete set of results can be found in Results Review.    Imaging reviewed:  US Abdomen Limited RUQ (LIVER/GB) Result Date:  01/07/2024 CLINICAL DATA:  Right upper quadrant abdominal pain. EXAM: ULTRASOUND ABDOMEN LIMITED RIGHT UPPER QUADRANT COMPARISON:  CT scan of same day. FINDINGS: Gallbladder: No gallstones or wall thickening visualized. No sonographic Murphy sign noted by sonographer. Common bile duct: Diameter: 3 mm which is within normal limits. Liver: No focal lesion identified. Within normal limits in parenchymal echogenicity. Portal vein is patent on color Doppler imaging with normal direction of blood flow towards the liver. Other: None. IMPRESSION: No definite abnormality seen in the right upper quadrant of the abdomen. Electronically Signed   By: Lupita Raider M.D.   On: 01/07/2024 15:41   CT ABDOMEN PELVIS W CONTRAST Result Date: 01/07/2024 CLINICAL DATA:  Epigastric pain. EXAM: CT ABDOMEN AND PELVIS WITH CONTRAST TECHNIQUE: Multidetector CT imaging of the abdomen and pelvis was performed using the standard protocol following bolus administration of intravenous contrast. RADIATION DOSE REDUCTION: This exam was performed according to the departmental dose-optimization program which includes automated exposure control, adjustment of the mA and/or kV according to patient size and/or use of iterative reconstruction technique. CONTRAST:  60mL OMNIPAQUE IOHEXOL 350 MG/ML SOLN COMPARISON:  CT angiogram 06/25/2023. FINDINGS: Lower chest: There is some linear opacity lung bases likely scar or atelectasis. No pleural effusion. Mild ground-glass as well. Coronary artery calcifications are seen. Patulous esophagus with air and fluid. Hepatobiliary: Dilated gallbladder with some stones. No wall thickening. Preserved hepatic parenchyma. Patent portal vein. Pancreas: Unremarkable. No pancreatic ductal dilatation or surrounding inflammatory changes. Spleen: Normal in size without focal abnormality. Adrenals/Urinary Tract: Slight nodular thickening of the adrenal glands, unchanged from previous. No enhancing renal mass or collecting  system dilatation. Underdistended urinary bladder. Bladder wall slightly thickened. Stomach/Bowel: Surgical changes from gastric bypass with a gastrojejunostomy. The excluded portion of the stomach is with some mild luminal fluid. Small bowel is nondilated on this non oral contrast  exam. Large bowel has a normal caliber with scattered colonic stool. Redundant course to the colon diffusely particularly the sigmoid colon and transverse colon. Normal caliber appendix. Vascular/Lymphatic: Aortic atherosclerosis. No enlarged abdominal or pelvic lymph nodes. Reproductive: Prostate is unremarkable. Other: No free air or free fluid.  Mild anasarca. Musculoskeletal: Mild degenerative changes along the spine. IMPRESSION: Redundant course of the colon with diffuse stool. Normal appendix. No obstruction. Distended gallbladder with dependent stones. If there is further concern of acute gallbladder pathology ultrasound may be useful as the next step in the workup. Patulous esophagus.  Gastric bypass with a gastrojejunostomy. Electronically Signed   By: Karen Kays M.D.   On: 01/07/2024 14:25   DG Chest 2 View Result Date: 01/07/2024 CLINICAL DATA:  Chest pain EXAM: CHEST - 2 VIEW COMPARISON:  03/28/2022 FINDINGS: Mild left hemidiaphragm elevation. Midline trachea. Normal heart size. Atherosclerosis in the transverse aorta. No pleural effusion or pneumothorax. Clear lungs. IMPRESSION: Aortic Atherosclerosis (ICD10-I70.0). No active cardiopulmonary disease. Electronically Signed   By: Jeronimo Greaves M.D.   On: 01/07/2024 12:43   Review of CT: Appears to have a radiopaque stone in dependent portion of the gallbladder on CT.    EKG:  Sinus rhythm with RBBB, no acute ischemic changes  ED Course:  Treated with 1 L IV fluid, Zofran, Phenergan, PPI, morphine, Dilaudid.   Assessment/Plan:  68 y.o. male with hx Jehovah's Witness, with hx of remote history CAD with diffuse moderate CAD including prox/mid LAD stenosis,  history of PE no longer on anticoagulation after GI bleed, prostate cancer s/p radiation, hypertension, hyperlipidemia, diabetes, Roux-en-Y gastric bypass, GERD, who presented with acute onset of "chest pain" although appears to be epigastric pain and associated with intractable vomiting, intolerance p.o.'s.  Epigastric pain, intractable vomiting History of more chronic nausea, epigastric discomfort, weight loss had been investigated with by GI with including EGD in 7/'23 which was normal.  Acute epigastric pain and vomiting after eating breakfast 2/1, unable to tolerate p.o.'s at this point.  Exam mild epigastric tenderness.  Labs unremarkable.  CT abdomen pelvis with distended gallbladder and cholelithiasis, also seen patulous esophagus.  Follow-up right upper quadrant ultrasound did not demonstrate cholelithiasis, no signs of cholecystitis, or choledocholithiasis; CBD 3 mm.  Etiology Ddx esophagitis, gastritis, viral illness, toxonosis, dysmotility post bypass, esophageal stenosis, biliary colic, passed cholelithiasis appearing most likely.  Briefly messaged with general surgery re: possibility of a passed stone/colic, recommending HIDA scan and then contacting their service pending results, recommending GI consult.  -GI consult, messaged [facility] Dr. Myrtie Neither for routine consult in the morning -HIDA scan, n.p.o. after midnight for this -Otherwise will trial clear liquid diet and advance as tolerated -Start pantoprazole 40 mg p.o. twice daily, was on omeprazole 40 mg outpatient. -Symptomatic management Zofran and Compazine as needed for nausea -LR at 75 cc/h overnight  Reported chest pain Although he reports having chest pain he indicates that epigastric region and symptoms appear to be GI in nature.  No history of exertional chest pain or dyspnea.  High-sensitivity troponin is negative.  EKG has sinus rhythm and right bundle branch block with no acute ischemic changes.  He does have a history of CAD  and abnormal coronary CTA with moderate diffuse CAD including a proximal/mid LAD lesion; however feel anginal symptoms less likely. -Not taking antiplatelets, may consider starting aspirin  -Continue atorvastatin 80 mg -Outpatient follow-up cardiology  Chronic medical problems: History of PE: No longer on AC after history of GI bleed Prostate cancer,  history XRT: Outpatient surveillance Hypertension: Hold on chlorthalidone in setting of volume depletion. HLD: See statin above DM: Home regimen includes glipizide, metformin.  Hold oral agents inpatient.  SSI as needed.   There is no height or weight on file to calculate BMI.    DVT prophylaxis:  SCDs Code Status:  Full Code Diet:  Diet Orders (From admission, onward)     Start     Ordered   01/08/24 0001  Diet NPO time specified  Diet effective midnight        01/07/24 2024   01/07/24 2003  Diet clear liquid Room service appropriate? Yes; Fluid consistency: Thin  Diet effective now       Question Answer Comment  Room service appropriate? Yes   Fluid consistency: Thin      01/07/24 2004           Family Communication:  Yes discussed with wife at bedside   Consults:  GI,    Admission status:   Observation, Med-Surg  Severity of Illness: The appropriate patient status for this patient is OBSERVATION. Observation status is judged to be reasonable and necessary in order to provide the required intensity of service to ensure the patient's safety. The patient's presenting symptoms, physical exam findings, and initial radiographic and laboratory data in the context of their medical condition is felt to place them at decreased risk for further clinical deterioration. Furthermore, it is anticipated that the patient will be medically stable for discharge from the hospital within 2 midnights of admission.    Dolly Rias, MD [facility]  How to contact the [facility] Attending or Consulting provider 7A - 7P or covering  provider during after hours 7P -7A, for this patient.  Check the care team in [facility] and look for a) attending/consulting [facility] provider listed and b) the [facility] team listed Log into [email or web address] and use [place]'s universal password to access. If you do not have the password, please contact the hospital operator. Locate the [facility] provider you are looking for under [facility] and page to a number that you can be directly reached. If you still have difficulty reaching the provider, please page the [facility] (Director on Call) for the Hospitalists listed on amion for assistance.  01/07/2024, 8:28 PM

## 2024-01-07 NOTE — ED Provider Triage Note (Signed)
Emergency Medicine Provider Triage Evaluation Note  [name] , a 68 y.o. male  was evaluated in triage.  Pt complains of chest pain that began 45 minutes prior to arrival.  Patient had eggs for breakfast and denies any pancreas history but states he has substernal chest pain that is a burning sensation and has been having nausea vomiting.  Patient states he does have history of cardiac events however states this feels different.  Patient took his nitro at home to no relief.  Denies shortness of breath.  Review of Systems  Positive:  Negative:   Physical Exam  BP 136/71 (BP Location: Right Arm)   Pulse (!) 55   Temp 98.1 F (36.7 C)   Resp (!) 22   SpO2 100%  Gen:   Awake, in distress   Resp:  Normal effort  MSK:   Moves extremities without difficulty  Other:  Mild epigastric tenderness  Medical Decision Making  Medically screening exam initiated at 10:18 AM.  Appropriate orders placed.  [name] was informed that the remainder of the evaluation will be completed by another provider, this initial triage assessment does not replace that evaluation, and the importance of remaining in the ED until their evaluation is complete.  Workup initiated, labs and imaging will be obtained, suspect this may be pancreas related, patient stable at this time.   Netta Corrigan, PA-C 01/07/24 1020

## 2024-01-07 NOTE — ED Provider Notes (Signed)
[facility]    CSN: [id number] Arrival date & time: 01/07/24  0855      History   Chief Complaint Chief Complaint  Patient presents with   Chest Pain    HPI [name] is a 68 y.o. male.   Patient presented to our clinic with a 30-minute history of central chest discomfort.  Pain is rated 8 on a 0-10 pain scale, described as pressure.  He does have a history of CAD and took a nitro which has not provided any relief of symptoms.  He reports associated nausea and vomiting but denies any shortness of breath.      Past Medical History:  Diagnosis Date   Arthritis    BACK PAIN    CAD (coronary artery disease) 06/28/2017   CAD in native artery    a. moderate by cardiac CT 2016.   Cancer [facility])    Chronic bilateral low back pain with bilateral sciatica 01/12/2019   Degenerative arthritis of right knee 07/15/2016   Injected in 07/15/2016 Orthovisc injection started 10/07/2016 DepoMedrol 40 inj on 04/11/17, 25 cc aspirated Orthovisc injection in 05/30/2017     DEGENERATIVE JOINT DISEASE, KNEES, BILATERAL    Depression    Diabetes (HCC) 10/01/2014   Diabetes mellitus    type II   DIVERTICULOSIS, COLON    DVT, lower extremity (HCC)    right   GERD (gastroesophageal reflux disease)    History of kidney stones    Hyperlipidemia    Hypertension    HYPOGONADISM, MALE    HYPOTENSION, ORTHOSTATIC    Kidney stones    Long term (current) use of anticoagulants 08/17/2017   Long term current use of anticoagulant    Lumbosacral radiculopathy at S1 06/14/2012   Maxillary sinus cyst 12/11/2018   Obesity    OBESITY, MORBID 03/04/2008   Qualifier: Diagnosis of  By: Jonny Ruiz MD, Len Blalock    OSA (obstructive sleep apnea) 09/11/2013   Pulmonary emboli (HCC)    Pulmonary embolism (HCC) 02/05/2011   RBBB 07/28/2017   Renal insufficiency    a. prior h/o, does not appear chronic.   Superficial phlebitis of arm 03/18/2016   Vitamin D deficiency 07/09/2020    Patient Active  Problem List   Diagnosis Date Noted   Finger pain, left 11/25/2023   Diarrhea 09/15/2023   Allergic rhinitis 06/27/2023   Complication associated with orthopedic device (HCC) 04/01/2023   Cough 04/20/2022   Encounter for well adult exam with abnormal findings 03/29/2022   External hemorrhoid, thrombosed 03/29/2022   Iron deficiency anemia due to chronic blood loss 12/27/2021   Malignant neoplasm of prostate (HCC) 11/15/2021   Refusal of blood transfusions as patient is Jehov[employer]'s Witness 11/04/2021   Abnormal CT of the abdomen    Chest pain 11/03/2021   Hematochezia    GIB (gastrointestinal bleeding) 11/02/2021   CKD (chronic kidney disease) stage 3, GFR 30-59 ml/min (HCC) 03/14/2021   Elevated PSA 03/13/2021   B12 deficiency 03/13/2021   Acute blood loss anemia 12/14/2020   Osteoarthritis of right knee 11/18/2020   S/P right TKA 11/18/2020   Gross hematuria 09/28/2020   Left flank pain 09/23/2020   Dyspepsia 07/09/2020   Left groin pain 07/09/2020   Vitamin D deficiency 07/09/2020   Chronic low back pain 01/12/2019   Headache 12/11/2018   AKI (acute kidney injury) (HCC) 12/11/2018   Nosebleed 12/11/2018   Maxillary sinus cyst 12/11/2018   Left leg pain 12/11/2018   Acute tubular injury of  transplanted kidney (HCC) 12/11/2018   Lower limb pain, inferior, left 12/11/2018   Left hip pain 12/01/2018   Hip pain 12/01/2018   Acute upper respiratory infection 08/17/2018   Increased prostate specific antigen (PSA) velocity 07/12/2018   Pain in lower jaw 03/23/2018   Pain of left heel 12/21/2017   RBBB 07/28/2017   CAD (coronary artery disease) 06/28/2017   Coronary arteriosclerosis 06/28/2017   Degenerative arthritis of right knee 07/15/2016   Bursitis of shoulder 07/15/2016   Right shoulder pain 06/24/2016   Right knee pain 06/24/2016   Superficial phlebitis of arm 03/18/2016   Abdominal pain 12/23/2015   Rash 10/29/2015   Eruption cyst 10/29/2015   Panniculitis affecting  back 09/30/2015   Chest pain at rest 03/27/2015   Dysuria 03/27/2015   Diabetes (HCC) 10/01/2014   S/P gastric bypass 08/07/2014   Supratherapeutic INR 08/07/2014   No blood products 07/17/2014   Nipple pain 03/13/2014   Breast mass in male 03/13/2014   Pain of breast 03/13/2014   Breast lump 03/13/2014   Encounter for therapeutic drug monitoring 01/25/2014   Esophageal reflux 12/19/2013   Type 2 diabetes mellitus with obesity (HCC) 12/19/2013   Peripheral edema 10/10/2013   OSA (obstructive sleep apnea) 09/11/2013   Lesion of penis 01/01/2013   Lumbosacral radiculopathy at S1 06/14/2012   S/P left knee replacement 01/10/2012   Preventative health care 12/16/2011   Back pain with radiation 09/18/2011   Backache with radiation 09/18/2011   Long term current use of anticoagulant 04/08/2011   HYPOTENSION, ORTHOSTATIC 01/29/2010   CONSTIPATION 01/21/2010   DEGENERATIVE JOINT DISEASE, KNEES, BILATERAL 11/17/2009   SMOKER 09/12/2009   HYPOGONADISM, MALE 03/04/2008   OBESITY, MORBID 03/04/2008   Depression 10/05/2007   DIVERTICULOSIS, COLON 10/05/2007   History of colonic polyps 10/05/2007   Hyperlipidemia 06/30/2007   Essential hypertension 06/30/2007   History of pulmonary embolus (PE) 06/13/2006    Past Surgical History:  Procedure Laterality Date   ankle surgury  2001 bilateral   with bone spurs and torn tendon   COLONOSCOPY  03/18/2010   EDG  2008   chronic Duodenitis   FLEXIBLE SIGMOIDOSCOPY N/A 11/03/2021   Procedure: FLEXIBLE SIGMOIDOSCOPY;  Surgeon: Sherrilyn Rist, MD;  Location: [facility];  Service: Gastroenterology;  Laterality: N/A;   GASTRIC BYPASS     HEEL SPUR SURGERY Bilateral 2005   IR ANGIOGRAM PELVIS SELECTIVE OR SUPRASELECTIVE  11/02/2021   IR ANGIOGRAM PELVIS SELECTIVE OR SUPRASELECTIVE  11/04/2021   IR ANGIOGRAM SELECTIVE EACH ADDITIONAL VESSEL  11/02/2021   IR ANGIOGRAM SELECTIVE EACH ADDITIONAL VESSEL  11/02/2021   IR ANGIOGRAM SELECTIVE  EACH ADDITIONAL VESSEL  11/02/2021   IR ANGIOGRAM SELECTIVE EACH ADDITIONAL VESSEL  11/04/2021   IR ANGIOGRAM SELECTIVE EACH ADDITIONAL VESSEL  11/04/2021   IR ANGIOGRAM SELECTIVE EACH ADDITIONAL VESSEL  11/04/2021   IR ANGIOGRAM SELECTIVE EACH ADDITIONAL VESSEL  11/04/2021   IR ANGIOGRAM VISCERAL SELECTIVE  11/02/2021   IR ANGIOGRAM VISCERAL SELECTIVE  11/04/2021   IR EMBO ART  VEN HEMORR LYMPH EXTRAV  INC GUIDE ROADMAPPING  11/02/2021   IR EMBO ART  VEN HEMORR LYMPH EXTRAV  INC GUIDE ROADMAPPING  11/04/2021   IR EMBO ART  VEN HEMORR LYMPH EXTRAV  INC GUIDE ROADMAPPING  11/04/2021   IR RADIOLOGIST EVAL & MGMT  06/01/2023   IR US GUIDE VASC ACCESS RIGHT  11/02/2021   IR US GUIDE VASC ACCESS RIGHT  11/04/2021   knee surgury  2000 & 2003  cartilage damage   LEFT HEART CATH AND CORONARY ANGIOGRAPHY N/A 02/16/2021   Procedure: LEFT HEART CATH AND CORONARY ANGIOGRAPHY;  Surgeon: Tonny Bollman, MD;  Location: [facility];  Service: Cardiovascular;  Laterality: N/A;   LUMBAR LAMINECTOMY/DECOMPRESSION MICRODISCECTOMY  09/28/2012   Procedure: LUMBAR LAMINECTOMY/DECOMPRESSION MICRODISCECTOMY 2 LEVELS;  Surgeon: Drucilla Schmidt, MD;  Location: [facility];  Service: Orthopedics;  Laterality: Left;  Decompressive laminectomy L4-L5. Microdiscectomy L5-S1   Skin removal surgery     extra abdominal skin removed from his weight loss   TONSILLECTOMY AND ADENOIDECTOMY  age 24 or 6   TOTAL KNEE ARTHROPLASTY  01/10/2012   Procedure: TOTAL KNEE ARTHROPLASTY;  Surgeon: Shelda Pal, MD;  Location: [facility];  Service: Orthopedics;  Laterality: Left;   TOTAL KNEE ARTHROPLASTY Right 11/18/2020   Procedure: TOTAL KNEE ARTHROPLASTY;  Surgeon: Durene Romans, MD;  Location: [facility];  Service: Orthopedics;  Laterality: Right;  70 mins   UPPER GASTROINTESTINAL ENDOSCOPY  03/18/2010       Home Medications    Prior to Admission medications   Medication Sig Start Date End Date Taking? Authorizing Provider   Artificial Tear Ointment (ARTIFICIAL TEARS) ointment Place 1 drop into both eyes every 4 (four) hours as needed (dry eyes).     [provider]  atorvastatin (LIPITOR) 80 MG tablet TAKE 1 TABLET BY MOUTH EVERY DAY 08/03/23   Corwin Levins, MD  chlorthalidone (HYGROTON) 25 MG tablet Take 1 tablet (25 mg total) by mouth daily. 09/13/23   Corwin Levins, MD  Cholecalciferol (VITAMIN D3) 125 MCG (5000 UT) CAPS Take 5,000 Units by mouth daily.     [provider]  ferrous sulfate 325 (65 FE) MG EC tablet Take 1 tablet by mouth daily.    [provider]  ferrous sulfate 325 (65 FE) MG EC tablet TAKE 1 TABLET BY MOUTH EVERY DAY 08/24/23   Malachy Mood, MD  folic acid (FOLVITE) 1 MG tablet TAKE 1 TABLET BY MOUTH EVERY DAY 08/06/23   Malachy Mood, MD  glipiZIDE (GLUCOTROL XL) 2.5 MG 24 hr tablet TAKE 1 TABLET BY MOUTH EVERY DAY WITH BREAKFAST 11/12/22   Corwin Levins, MD  Lidocaine-Hydrocort, Perianal, 3-0.5 % CREA Use as directed three times per day as needed 03/26/22   Corwin Levins, MD  metFORMIN (GLUCOPHAGE-XR) 500 MG 24 hr tablet Take 4 tablets (2,000 mg total) by mouth daily with breakfast. 09/13/23   Corwin Levins, MD  mirtazapine (REMERON) 15 MG tablet TAKE 1 TABLET BY MOUTH EVERYDAY AT BEDTIME 11/30/22   Iva Boop, MD  Multiple Vitamin (MULTIVITAMIN WITH MINERALS) TABS tablet Take 1 tablet by mouth daily at 12 noon.    [provider]  nitroGLYCERIN (NITROSTAT) 0.4 MG SL tablet Place 1 tablet (0.4 mg total) under the tongue every 5 (five) minutes as needed for chest pain. 07/30/15   Lars Masson, MD  omeprazole (PRILOSEC) 40 MG capsule Take 1 capsule (40 mg total) by mouth daily. 11/24/23   Corwin Levins, MD  ondansetron (ZOFRAN) 4 MG tablet Take 1 tablet (4 mg total) by mouth every 8 (eight) hours as needed for nausea or vomiting. 06/02/22   Unk Lightning, PA  [facility] ULTRA TEST test strip USE AS INSTRUCTED 07/01/23   Corwin Levins, MD  vitamin B-12 1000  MCG tablet Take 1 tablet (1,000 mcg total) by mouth daily. 11/08/21   Kathlen Mody, MD    Family History Family History  Problem  Relation Age of Onset   Hypertension Mother    Prostate cancer Father    Diabetes Sister        died with DM 01-19-2001, 2 more sisters with diabetes   Heart failure Sister    Diabetes Brother    Diabetes Sister    Diabetes Sister    Colon cancer Neg Hx    Colon polyps Neg Hx    Esophageal cancer Neg Hx    Rectal cancer Neg Hx    Stomach cancer Neg Hx     Social History Social History   Tobacco Use   Smoking status: Former    Current packs/day: 0.00    Average packs/day: 0.3 packs/day for 5.0 years (1.3 ttl pk-yrs)    Types: Cigarettes    Start date: 12/06/1973    Quit date: 12/06/1978    Years since quitting: 45.1   Smokeless tobacco: Never  Vaping Use   Vaping status: Never Used  Substance Use Topics   Alcohol use: Yes    Alcohol/week: 2.0 standard drinks of alcohol    Types: 2 Standard drinks or equivalent per week   Drug use: No     Allergies   Adhesive [tape] and Other   Review of Systems Review of Systems  Respiratory:  Negative for shortness of breath.   Cardiovascular:  Positive for chest pain. Negative for palpitations.  Gastrointestinal:  Positive for nausea and vomiting.     Physical Exam Triage Vital Signs ED Triage Vitals  Encounter Vitals Group     BP      Systolic BP Percentile      Diastolic BP Percentile      Pulse      Resp      Temp      Temp src      SpO2      Weight      Height      Head Circumference      Peak Flow      Pain Score      Pain Loc      Pain Education      Exclude from Growth Chart    No data found.  Updated Vital Signs BP (!) 154/74 (BP Location: Left Arm)   Pulse 76   Resp (!) 22   SpO2 97%   Visual Acuity Right Eye Distance:   Left Eye Distance:   Bilateral Distance:    Right Eye Near:   Left Eye Near:    Bilateral Near:     Physical Exam Vitals reviewed.   Constitutional:      General: He is awake.     Appearance: Normal appearance. He is well-developed. He is ill-appearing and diaphoretic.     Comments: Appears older than stated age ill-appearing holding emesis basin dry heaving and diaphoretic  HENT:     Head: Normocephalic and atraumatic.  Cardiovascular:     Rate and Rhythm: Normal rate and regular rhythm.     Heart sounds: Normal heart sounds, S1 normal and S2 normal.  Pulmonary:     Effort: Pulmonary effort is normal.     Breath sounds: Normal breath sounds. No stridor. No wheezing, rhonchi or rales.  Neurological:     Mental Status: He is alert.  Psychiatric:        Behavior: Behavior is cooperative.      UC Treatments / Results  Labs (all labs ordered are listed, but only abnormal results are displayed) Labs Reviewed - No data to  display  EKG   Radiology No results found.  Procedures Procedures (including critical care time)  Medications Ordered in UC Medications - No data to display  Initial Impression / Assessment and Plan / UC Course  I have reviewed the triage vital signs and the nursing notes.  Pertinent labs & imaging results that were available during my care of the patient were reviewed by me and considered in my medical decision making (see chart for details).     Concern for acute CAD given his clinical presentation.  EMS was activated and IV placed by nursing staff.  EKG was obtained but patient had difficulty remaining still but does appear to have depressions in inferior pattern.  He was taken to the ER by EMS.  Final Clinical Impressions(s) / UC Diagnoses   Final diagnoses:  Chest pain, unspecified type   Discharge Instructions   None    ED Prescriptions   None    PDMP not reviewed this encounter.   Jeani Hawking, PA-C 01/07/24 [id number]

## 2024-01-07 NOTE — ED Notes (Signed)
Pt here for severe left sided CP with diaphoresis and vomiting starting about 15 min pta

## 2024-01-07 NOTE — ED Notes (Signed)
Patient is being discharged from the Urgent Care and sent to the Emergency Department via EMS . Per ER, patient is in need of higher level of care due to CP. Patient is aware and verbalizes understanding of plan of care.  Vitals:   01/07/24 0916  BP: (!) 154/74  Pulse: 76  Resp: (!) 22  SpO2: 97%

## 2024-01-07 NOTE — ED Notes (Signed)
 Patient transported to Ultrasound

## 2024-01-07 NOTE — ED Triage Notes (Signed)
Pt [facility] from [facility] for centralized CP, non-radiating pressure.  Pale, diaphoretic, hx of angina and CAD.    0.4 SL nitroglycerin, 4mg  zofran, 324 ASA but immediately threw up after. ,   VSS, a&O

## 2024-01-07 NOTE — ED Provider Notes (Signed)
[place] EMERGENCY DEPARTMENT AT [facility] Provider Note   CSN: [id number] Arrival date & time: 01/07/24  7829     History  No chief complaint on file.   [name] is a 68 y.o. male history of hypertension, PE on long-term anticoagulation, diabetes, CAD, prostate cancer presented for substernal chest pain/epigastric pain that occurred 45 minutes prior to arrival. Patient had eggs for breakfast and denies any pancreas history but states he has substernal chest pain that is a burning sensation and has been having nausea vomiting. Patient states he does have history of cardiac events however states this feels different. Patient took his nitro at home to no relief. Denies shortness of breath.   Home Medications Prior to Admission medications   Medication Sig Start Date End Date Taking? Authorizing Provider  Artificial Tear Ointment (ARTIFICIAL TEARS) ointment Place 1 drop into both eyes every 4 (four) hours as needed (dry eyes).     [provider]  atorvastatin (LIPITOR) 80 MG tablet TAKE 1 TABLET BY MOUTH EVERY DAY 08/03/23   Corwin Levins, MD  chlorthalidone (HYGROTON) 25 MG tablet Take 1 tablet (25 mg total) by mouth daily. 09/13/23   Corwin Levins, MD  Cholecalciferol (VITAMIN D3) 125 MCG (5000 UT) CAPS Take 5,000 Units by mouth daily.     [provider]  ferrous sulfate 325 (65 FE) MG EC tablet Take 1 tablet by mouth daily.    [provider]  ferrous sulfate 325 (65 FE) MG EC tablet TAKE 1 TABLET BY MOUTH EVERY DAY 08/24/23   Malachy Mood, MD  folic acid (FOLVITE) 1 MG tablet TAKE 1 TABLET BY MOUTH EVERY DAY 08/06/23   Malachy Mood, MD  glipiZIDE (GLUCOTROL XL) 2.5 MG 24 hr tablet TAKE 1 TABLET BY MOUTH EVERY DAY WITH BREAKFAST 11/12/22   Corwin Levins, MD  Lidocaine-Hydrocort, Perianal, 3-0.5 % CREA Use as directed three times per day as needed 03/26/22   Corwin Levins, MD  metFORMIN (GLUCOPHAGE-XR) 500 MG 24 hr tablet Take 4 tablets (2,000 mg total)  by mouth daily with breakfast. 09/13/23   Corwin Levins, MD  mirtazapine (REMERON) 15 MG tablet TAKE 1 TABLET BY MOUTH EVERYDAY AT BEDTIME 11/30/22   Iva Boop, MD  Multiple Vitamin (MULTIVITAMIN WITH MINERALS) TABS tablet Take 1 tablet by mouth daily at 12 noon.    [provider]  nitroGLYCERIN (NITROSTAT) 0.4 MG SL tablet Place 1 tablet (0.4 mg total) under the tongue every 5 (five) minutes as needed for chest pain. 07/30/15   Lars Masson, MD  omeprazole (PRILOSEC) 40 MG capsule Take 1 capsule (40 mg total) by mouth daily. 11/24/23   Corwin Levins, MD  ondansetron (ZOFRAN) 4 MG tablet Take 1 tablet (4 mg total) by mouth every 8 (eight) hours as needed for nausea or vomiting. 06/02/22   Unk Lightning, PA  [facility] ULTRA TEST test strip USE AS INSTRUCTED 07/01/23   Corwin Levins, MD  vitamin B-12 1000 MCG tablet Take 1 tablet (1,000 mcg total) by mouth daily. 11/08/21   Kathlen Mody, MD      Allergies    Adhesive [tape] and Other    Review of Systems   Review of Systems  Physical Exam Updated Vital Signs BP (!) 166/74   Pulse 75   Temp 98.7 F (37.1 C) (Axillary)   Resp (!) 22   SpO2 100%  Physical Exam Vitals reviewed.  Constitutional:  General: He is in acute distress.     Comments: Active emesis  HENT:     Head: Normocephalic and atraumatic.  Eyes:     Extraocular Movements: Extraocular movements intact.     Conjunctiva/sclera: Conjunctivae normal.     Pupils: Pupils are equal, round, and reactive to light.  Cardiovascular:     Rate and Rhythm: Normal rate and regular rhythm.     Pulses: Normal pulses.     Heart sounds: Normal heart sounds.     Comments: 2+ bilateral radial/dorsalis pedis pulses with regular rate Pulmonary:     Effort: Pulmonary effort is normal. No respiratory distress.     Breath sounds: Normal breath sounds.  Abdominal:     Palpations: Abdomen is soft.     Tenderness: There is abdominal tenderness (Epigastric). There  is no guarding or rebound.  Musculoskeletal:        General: Normal range of motion.     Cervical back: Normal range of motion and neck supple.     Comments: 5 out of 5 bilateral grip/leg extension strength  Skin:    General: Skin is warm and dry.     Capillary Refill: Capillary refill takes less than 2 seconds.  Neurological:     General: No focal deficit present.     Mental Status: He is alert and oriented to person, place, and time.     Comments: Sensation intact in all 4 limbs  Psychiatric:        Mood and Affect: Mood normal.     ED Results / Procedures / Treatments   Labs (all labs ordered are listed, but only abnormal results are displayed) Labs Reviewed  BASIC METABOLIC PANEL - Abnormal; Notable for the following components:      Result Value   Glucose, Bld 152 (*)    BUN 26 (*)    Creatinine, Ser 1.59 (*)    GFR, Estimated 46 (*)    All other components within normal limits  CBC - Abnormal; Notable for the following components:   RBC 5.84 (*)    MCV 74.0 (*)    MCH 22.3 (*)    RDW 15.9 (*)    All other components within normal limits  LIPASE, BLOOD  HEPATIC FUNCTION PANEL  TROPONIN I (HIGH SENSITIVITY)  TROPONIN I (HIGH SENSITIVITY)    EKG None  Radiology CT ABDOMEN PELVIS W CONTRAST Result Date: 01/07/2024 CLINICAL DATA:  Epigastric pain. EXAM: CT ABDOMEN AND PELVIS WITH CONTRAST TECHNIQUE: Multidetector CT imaging of the abdomen and pelvis was performed using the standard protocol following bolus administration of intravenous contrast. RADIATION DOSE REDUCTION: This exam was performed according to the departmental dose-optimization program which includes automated exposure control, adjustment of the mA and/or kV according to patient size and/or use of iterative reconstruction technique. CONTRAST:  60mL OMNIPAQUE IOHEXOL 350 MG/ML SOLN COMPARISON:  CT angiogram 06/25/2023. FINDINGS: Lower chest: There is some linear opacity lung bases likely scar or atelectasis.  No pleural effusion. Mild ground-glass as well. Coronary artery calcifications are seen. Patulous esophagus with air and fluid. Hepatobiliary: Dilated gallbladder with some stones. No wall thickening. Preserved hepatic parenchyma. Patent portal vein. Pancreas: Unremarkable. No pancreatic ductal dilatation or surrounding inflammatory changes. Spleen: Normal in size without focal abnormality. Adrenals/Urinary Tract: Slight nodular thickening of the adrenal glands, unchanged from previous. No enhancing renal mass or collecting system dilatation. Underdistended urinary bladder. Bladder wall slightly thickened. Stomach/Bowel: Surgical changes from gastric bypass with a gastrojejunostomy. The excluded portion of the  stomach is with some mild luminal fluid. Small bowel is nondilated on this non oral contrast exam. Large bowel has a normal caliber with scattered colonic stool. Redundant course to the colon diffusely particularly the sigmoid colon and transverse colon. Normal caliber appendix. Vascular/Lymphatic: Aortic atherosclerosis. No enlarged abdominal or pelvic lymph nodes. Reproductive: Prostate is unremarkable. Other: No free air or free fluid.  Mild anasarca. Musculoskeletal: Mild degenerative changes along the spine. IMPRESSION: Redundant course of the colon with diffuse stool. Normal appendix. No obstruction. Distended gallbladder with dependent stones. If there is further concern of acute gallbladder pathology ultrasound may be useful as the next step in the workup. Patulous esophagus.  Gastric bypass with a gastrojejunostomy. Electronically Signed   By: Karen Kays M.D.   On: 01/07/2024 14:25   DG Chest 2 View Result Date: 01/07/2024 CLINICAL DATA:  Chest pain EXAM: CHEST - 2 VIEW COMPARISON:  03/28/2022 FINDINGS: Mild left hemidiaphragm elevation. Midline trachea. Normal heart size. Atherosclerosis in the transverse aorta. No pleural effusion or pneumothorax. Clear lungs. IMPRESSION: Aortic Atherosclerosis  (ICD10-I70.0). No active cardiopulmonary disease. Electronically Signed   By: Jeronimo Greaves M.D.   On: 01/07/2024 12:43    Procedures Ultrasound ED Echo  Date/Time: 01/07/2024 1:04 PM  Performed by: Netta Corrigan, PA-C Authorized by: Netta Corrigan, PA-C   Procedure details:    Indications: chest pain     Views: subxiphoid, parasternal long axis view, parasternal short axis view, apical 4 chamber view and IVC view     Images: archived     Limitations:  Acoustic shadowing Findings:    Pericardium: no pericardial effusion     LV Function: normal (>50% EF)     RV Diameter: normal     IVC: normal   Impression:    Impression: normal   Ultrasound ED Abd  Date/Time: 01/07/2024 2:51 PM  Performed by: Netta Corrigan, PA-C Authorized by: Netta Corrigan, PA-C   Procedure details:    Indications: abdominal pain     Assessment for:  Gallstones   Hepatobiliary:  Visualized    Images: archived   Study Limitations: body habitus Hepatobiliary findings:    Common bile duct:  Unable to visualize   Gallbladder wall:  Normal   Gallbladder stones: identified     Mass: not identified     Intra-abdominal fluid: not identified     Polyps: not identified     Sonographic Murphy's sign: negative       Medications Ordered in ED Medications  HYDROmorphone (DILAUDID) injection 0.5 mg (has no administration in time range)  morphine (PF) 4 MG/ML injection 4 mg (4 mg Intravenous Given 01/07/24 1031)  ondansetron (ZOFRAN) injection 4 mg (4 mg Intravenous Given 01/07/24 1031)  HYDROmorphone (DILAUDID) injection 0.5 mg (0.5 mg Intravenous Given 01/07/24 1310)  ondansetron (ZOFRAN) injection 4 mg (4 mg Intravenous Given 01/07/24 1309)  sodium chloride 0.9 % bolus 1,000 mL (1,000 mLs Intravenous New Bag/Given 01/07/24 1308)  iohexol (OMNIPAQUE) 350 MG/ML injection 60 mL (60 mLs Intravenous Contrast Given 01/07/24 1409)    ED Course/ Medical Decision Making/ A&P                                 Medical  Decision Making Amount and/or Complexity of Data Reviewed Labs: ordered. Radiology: ordered.  Risk Prescription drug management.   Rosalyn Charters 68 y.o. presented today for chest pain. Working DDx that I considered at  this time includes, but not limited to, ACS, pulmonary embolism, community-acquired pneumonia, aortic dissection, pneumothorax, underlying bony abnormality, anemia, thyrotoxicosis, HTN urgency/emergency, esophageal rupture, CHF exacerbation, valvular disorder, myocarditis, pericarditis, endocarditis, pericardial effusion/cardiac tamponade, pulmonary edema, gastritis/PUD/GERD, esophagitis, MSK.  R/o Dx: ACS, pulmonary embolism, community-acquired pneumonia, aortic dissection, pneumothorax, underlying bony abnormality, anemia, thyrotoxicosis, HTN urgency/emergency, esophageal rupture, CHF exacerbation, valvular disorder, myocarditis, pericarditis, endocarditis, pericardial effusion/cardiac tamponade, pulmonary edema, MSK: These are considered less likely due to history of present illness and physical exam findings. Aortic Dissection: less likely based on the location, quality, onset, and severity of symptoms in this case. Patient also has a lack of underlying history of AD or TAA.   Review of prior external notes: 06/25/2023 ED provider  Unique Tests and My Interpretation:  EKG: Rate, rhythm, axis, intervals all examined and without medically relevant abnormality. ST segments without concerns for elevations Troponin: 4, 4 CXR: No acute findings CBC: Unremarkable BMP: Unremarkable Hepatic function panel: Unremarkable Lipase: Unremarkable CT on pelvis with contrast: Distended gallbladder with stones however no acute findings Right upper quadrant ultrasound: Pending  Social Determinants of Health: none  Discussion with Independent Historian:  Family  Discussion of Management of Tests: None  Risk: Medium: prescription drug management  Risk Stratification Score:  none  Plan: On exam patient was in acute distress. Patient's physical was remarkable for epigastric tenderness. Labs and CXR will be ordered.  The cardiac monitor was ordered secondary to the patient's history of chest pain and to monitor the patient for dysrhythmia. The cardiac monitor revealed normal sinus rhythm as interpreted by me. Patient stable at this time.  Labs and imaging show negative delta troponin and ultimately reassuring labs.  Patient is having continuous nausea vomiting so do suspect this chest pain to be GI related and will give more pain meds along with fluids and a CT scan.  Bedside echo did not show any acute abnormalities however right upper quadrant showed possible stone in his gallbladder but the stone was not at the neck and so do not feel that this is causing patient's symptoms but will consider upper quadrant ultrasound if CT scan is unremarkable.  CT scan shows stones in the gallbladder but otherwise no acute findings.  Given patient's continuing pain will get right upper quadrant ultrasound is formal.  On recheck patient is still having abdominal discomfort and so we will give another round of pain meds.  Patient signed out to [facility], PA-C.  Please review their note for the continuation of patient's care.  The plan at this point is follow-up on ultrasound and dispo accordingly.  Patient will need to be p.o. challenge if considering discharge is he has a continuing nausea vomiting.  If pain cannot be controlled despite patient will need to be admitted for pain control.  Pending what ultrasound shows may need to consult general surgery.  This chart was dictated using voice recognition software.  Despite best efforts to proofread,  errors can occur which can change the documentation meaning.         Final Clinical Impression(s) / ED Diagnoses Final diagnoses:  None    Rx / DC Orders ED Discharge Orders     None         Remi Deter 01/07/24  1453    Lorre Nick, MD 01/07/24 [phone]

## 2024-01-07 NOTE — ED Notes (Signed)
 Assumed pt care.

## 2024-01-08 ENCOUNTER — Encounter (HOSPITAL_COMMUNITY): Payer: Self-pay | Admitting: Internal Medicine

## 2024-01-08 DIAGNOSIS — E785 Hyperlipidemia, unspecified: Secondary | ICD-10-CM | POA: Diagnosis not present

## 2024-01-08 DIAGNOSIS — Z1152 Encounter for screening for COVID-19: Secondary | ICD-10-CM | POA: Diagnosis not present

## 2024-01-08 DIAGNOSIS — K8020 Calculus of gallbladder without cholecystitis without obstruction: Secondary | ICD-10-CM | POA: Diagnosis not present

## 2024-01-08 DIAGNOSIS — Z86711 Personal history of pulmonary embolism: Secondary | ICD-10-CM | POA: Diagnosis not present

## 2024-01-08 DIAGNOSIS — R112 Nausea with vomiting, unspecified: Secondary | ICD-10-CM | POA: Diagnosis present

## 2024-01-08 DIAGNOSIS — Z86718 Personal history of other venous thrombosis and embolism: Secondary | ICD-10-CM | POA: Diagnosis not present

## 2024-01-08 DIAGNOSIS — Z96651 Presence of right artificial knee joint: Secondary | ICD-10-CM | POA: Diagnosis not present

## 2024-01-08 DIAGNOSIS — Z87891 Personal history of nicotine dependence: Secondary | ICD-10-CM | POA: Diagnosis not present

## 2024-01-08 DIAGNOSIS — Z7901 Long term (current) use of anticoagulants: Secondary | ICD-10-CM | POA: Diagnosis not present

## 2024-01-08 DIAGNOSIS — I10 Essential (primary) hypertension: Secondary | ICD-10-CM | POA: Diagnosis not present

## 2024-01-08 DIAGNOSIS — Z8672 Personal history of thrombophlebitis: Secondary | ICD-10-CM | POA: Diagnosis not present

## 2024-01-08 DIAGNOSIS — R079 Chest pain, unspecified: Secondary | ICD-10-CM | POA: Diagnosis not present

## 2024-01-08 DIAGNOSIS — I700 Atherosclerosis of aorta: Secondary | ICD-10-CM | POA: Diagnosis not present

## 2024-01-08 DIAGNOSIS — K219 Gastro-esophageal reflux disease without esophagitis: Secondary | ICD-10-CM | POA: Diagnosis not present

## 2024-01-08 DIAGNOSIS — E860 Dehydration: Secondary | ICD-10-CM | POA: Diagnosis not present

## 2024-01-08 DIAGNOSIS — N183 Chronic kidney disease, stage 3 unspecified: Secondary | ICD-10-CM | POA: Diagnosis not present

## 2024-01-08 DIAGNOSIS — Z79899 Other long term (current) drug therapy: Secondary | ICD-10-CM | POA: Diagnosis not present

## 2024-01-08 DIAGNOSIS — R1013 Epigastric pain: Secondary | ICD-10-CM | POA: Diagnosis not present

## 2024-01-08 DIAGNOSIS — R9389 Abnormal findings on diagnostic imaging of other specified body structures: Secondary | ICD-10-CM | POA: Diagnosis not present

## 2024-01-08 DIAGNOSIS — Z8042 Family history of malignant neoplasm of prostate: Secondary | ICD-10-CM | POA: Diagnosis not present

## 2024-01-08 DIAGNOSIS — I2510 Atherosclerotic heart disease of native coronary artery without angina pectoris: Secondary | ICD-10-CM | POA: Diagnosis not present

## 2024-01-08 DIAGNOSIS — Z980 Intestinal bypass and anastomosis status: Secondary | ICD-10-CM | POA: Diagnosis not present

## 2024-01-08 DIAGNOSIS — K828 Other specified diseases of gallbladder: Secondary | ICD-10-CM | POA: Diagnosis not present

## 2024-01-08 DIAGNOSIS — Z9884 Bariatric surgery status: Secondary | ICD-10-CM | POA: Diagnosis not present

## 2024-01-08 DIAGNOSIS — Z87442 Personal history of urinary calculi: Secondary | ICD-10-CM | POA: Diagnosis not present

## 2024-01-08 DIAGNOSIS — K2970 Gastritis, unspecified, without bleeding: Secondary | ICD-10-CM | POA: Diagnosis not present

## 2024-01-08 DIAGNOSIS — Z7984 Long term (current) use of oral hypoglycemic drugs: Secondary | ICD-10-CM | POA: Diagnosis not present

## 2024-01-08 DIAGNOSIS — R1011 Right upper quadrant pain: Secondary | ICD-10-CM | POA: Diagnosis not present

## 2024-01-08 DIAGNOSIS — R1110 Vomiting, unspecified: Secondary | ICD-10-CM | POA: Diagnosis not present

## 2024-01-08 DIAGNOSIS — Z923 Personal history of irradiation: Secondary | ICD-10-CM | POA: Diagnosis not present

## 2024-01-08 DIAGNOSIS — I129 Hypertensive chronic kidney disease with stage 1 through stage 4 chronic kidney disease, or unspecified chronic kidney disease: Secondary | ICD-10-CM | POA: Diagnosis not present

## 2024-01-08 DIAGNOSIS — K3189 Other diseases of stomach and duodenum: Secondary | ICD-10-CM | POA: Diagnosis not present

## 2024-01-08 DIAGNOSIS — R001 Bradycardia, unspecified: Secondary | ICD-10-CM | POA: Diagnosis not present

## 2024-01-08 DIAGNOSIS — Q438 Other specified congenital malformations of intestine: Secondary | ICD-10-CM | POA: Diagnosis not present

## 2024-01-08 DIAGNOSIS — Z8546 Personal history of malignant neoplasm of prostate: Secondary | ICD-10-CM | POA: Diagnosis not present

## 2024-01-08 DIAGNOSIS — E119 Type 2 diabetes mellitus without complications: Secondary | ICD-10-CM | POA: Diagnosis not present

## 2024-01-08 DIAGNOSIS — K259 Gastric ulcer, unspecified as acute or chronic, without hemorrhage or perforation: Secondary | ICD-10-CM | POA: Diagnosis not present

## 2024-01-08 DIAGNOSIS — R0789 Other chest pain: Secondary | ICD-10-CM | POA: Diagnosis not present

## 2024-01-08 LAB — BASIC METABOLIC PANEL
Anion gap: 9 (ref 5–15)
BUN: 19 mg/dL (ref 8–23)
CO2: 24 mmol/L (ref 22–32)
Calcium: 9.1 mg/dL (ref 8.9–10.3)
Chloride: 103 mmol/L (ref 98–111)
Creatinine, Ser: 1.25 mg/dL — ABNORMAL HIGH (ref 0.61–1.24)
GFR, Estimated: >60 mL/min (ref >60)
Glucose, Bld: 103 mg/dL — ABNORMAL HIGH (ref 70–99)
Potassium: 4 mmol/L (ref 3.5–5.1)
Sodium: 136 mmol/L (ref 135–145)

## 2024-01-08 LAB — HEPATIC FUNCTION PANEL
ALT: 31 U/L (ref 0–44)
AST: 19 U/L (ref 15–41)
Albumin: 3.6 g/dL (ref 3.5–5.0)
Alkaline Phosphatase: 73 U/L (ref 38–126)
Bilirubin, Direct: 0.1 mg/dL (ref 0.0–0.2)
Indirect Bilirubin: 0.8 mg/dL (ref 0.3–0.9)
Total Bilirubin: 0.9 mg/dL (ref 0.0–1.2)
Total Protein: 6.2 g/dL — ABNORMAL LOW (ref 6.5–8.1)

## 2024-01-08 LAB — GLUCOSE, CAPILLARY
Glucose-Capillary: 95 mg/dL (ref 70–99)
Glucose-Capillary: 104 mg/dL — ABNORMAL HIGH (ref 70–99)
Glucose-Capillary: 184 mg/dL — ABNORMAL HIGH (ref 70–99)
Glucose-Capillary: 65 mg/dL — ABNORMAL LOW (ref 70–99)
Glucose-Capillary: 227 mg/dL — ABNORMAL HIGH (ref 70–99)

## 2024-01-08 LAB — CBC
HCT: 40.4 % (ref 39.0–52.0)
Hemoglobin: 12.6 g/dL — ABNORMAL LOW (ref 13.0–17.0)
MCH: 22.3 pg — ABNORMAL LOW (ref 26.0–34.0)
MCHC: 31.2 g/dL (ref 30.0–36.0)
MCV: 71.6 fL — ABNORMAL LOW (ref 80.0–100.0)
Platelets: 162 10*3/uL (ref 150–400)
RBC: 5.64 MIL/uL (ref 4.22–5.81)
RDW: 15.1 % (ref 11.5–15.5)
WBC: 6 10*3/uL (ref 4.0–10.5)
nRBC: 0 % (ref 0.0–0.2)

## 2024-01-08 LAB — MAGNESIUM: Magnesium: 1.9 mg/dL (ref 1.7–2.4)

## 2024-01-08 LAB — HIV ANTIBODY (ROUTINE TESTING W REFLEX): HIV Screen 4th Generation wRfx: NONREACTIVE

## 2024-01-08 LAB — PHOSPHORUS: Phosphorus: 3.3 mg/dL (ref 2.5–4.6)

## 2024-01-08 NOTE — Progress Notes (Signed)
PROGRESS NOTE    [name]  ZOX:[id number] DOB: Jul 13, 1956 DOA: 01/07/2024 PCP: Corwin Levins, MD    Brief Narrative:  67 year old gentleman with history of coronary artery disease, history of PE currently not on anticoagulation, prostate cancer status postradiation hypertension hyperlipidemia, type 2 diabetes, Roux-en-Y gastric bypass and GERD presented with acute onset of epigastric pain after eating breakfast and profuse vomiting.  Seen in the emergency room.  Hemodynamically stable.  Admitted due to significant symptoms.  Subjective: Patient seen in the morning rounds.  Wife at the bedside.  Denies any complaints today.  Denies any nausea or chest discomfort. Hungry.  Assessment & Plan:   Epigastric pain/nausea and vomiting: Likely related to peptic ulcer disease, dyspepsia.  There is suspicion of gallbladder disease. Clinically improved.  Currently n.p.o. until seen by GI.  On PPI.  Continue. CT scan with gallbladder stone, ruled out by ultrasound.  No evidence of acute cholecystitis. LFTs are normal. HIDA scan to rule out gallbladder dysfunction. Anticipating upper GI endoscopy as per GI consult. Continue symptomatic management and maintenance IV fluids.  Chest pain: Atypical.  Epigastric pain likely related to 1.  Chronic medical issues including Prostate cancer, stable Hypertension, holding chlorthalidone due to dehydration.  Now on maintenance IV fluids. Type 2 diabetes: On glipizide and metformin at home.  Currently on sliding scale insulin.   DVT prophylaxis: SCDs Start: 01/07/24 2003   Code Status: Full code Family Communication: Wife at the bedside Disposition Plan: Status is: Observation The patient will require care spanning > 2 midnights and should be moved to inpatient because: Inpatient procedures planned     Consultants:  [facility]  Procedures:  None  Antimicrobials:  None     Objective: Vitals:   01/07/24 2133 01/07/24 2134 01/08/24  0450 01/08/24 0810  BP: (!) 144/85 (!) 144/85 116/70 117/68  Pulse:  (!) 48 (!) 45 (!) 44  Resp: 18 18 16 16   Temp: 98.5 F (36.9 C) 98.5 F (36.9 C) 98 F (36.7 C)   TempSrc: Oral  Oral   SpO2: 100% 99% 99% 98%  Weight:  116.9 kg    Height:  6\' 2"  (1.88 m)      Intake/Output Summary (Last 24 hours) at 01/08/2024 1255 Last data filed at 01/08/2024 0536 Gross per 24 hour  Intake 1031.81 ml  Output --  Net 1031.81 ml   Filed Weights   01/07/24 2134  Weight: 116.9 kg    Examination:  General exam: Appears calm and comfortable  Respiratory system: Clear to auscultation. Respiratory effort normal. Cardiovascular system: S1 & S2 heard, RRR. No JVD, murmurs, rubs, gallops or clicks. No pedal edema. Gastrointestinal system: Abdomen is nondistended, soft and nontender. No organomegaly or masses felt. Normal bowel sounds heard. Central nervous system: Alert and oriented. No focal neurological deficits.    Data Reviewed: I have personally reviewed following labs and imaging studies  CBC: Recent Labs  Lab 01/07/24 1003 01/08/24 0748  WBC 6.0 6.0  HGB 13.0 12.6*  HCT 43.2 40.4  MCV 74.0* 71.6*  PLT 185 162   Basic Metabolic Panel: Recent Labs  Lab 01/07/24 1003 01/08/24 0747  NA 139 136  K 4.7 4.0  CL 104 103  CO2 23 24  GLUCOSE 152* 103*  BUN 26* 19  CREATININE 1.59* 1.25*  CALCIUM 9.3 9.1  MG  --  1.9  PHOS  --  3.3   GFR: Estimated Creatinine Clearance: 74.7 mL/min (A) (by C-G formula based on SCr of  1.25 mg/dL (H)). Liver Function Tests: Recent Labs  Lab 01/07/24 1003 01/08/24 0747  AST 26 19  ALT 38 31  ALKPHOS 81 73  BILITOT 0.4 0.9  PROT 7.1 6.2*  ALBUMIN 4.1 3.6   Recent Labs  Lab 01/07/24 1003  LIPASE 39   No results for input(s): "AMMONIA" in the last 168 hours. Coagulation Profile: No results for input(s): "INR", "PROTIME" in the last 168 hours. Cardiac Enzymes: No results for input(s): "CKTOTAL", "CKMB", "CKMBINDEX", "TROPONINI" in  the last 168 hours. BNP (last 3 results) No results for input(s): "PROBNP" in the last 8760 hours. HbA1C: No results for input(s): "HGBA1C" in the last 72 hours. CBG: Recent Labs  Lab 01/07/24 2138 01/08/24 0906 01/08/24 1147  GLUCAP 99 95 104*   Lipid Profile: No results for input(s): "CHOL", "HDL", "LDLCALC", "TRIG", "CHOLHDL", "LDLDIRECT" in the last 72 hours. Thyroid Function Tests: No results for input(s): "TSH", "T4TOTAL", "FREET4", "T3FREE", "THYROIDAB" in the last 72 hours. Anemia Panel: No results for input(s): "VITAMINB12", "FOLATE", "FERRITIN", "TIBC", "IRON", "RETICCTPCT" in the last 72 hours. Sepsis Labs: No results for input(s): "PROCALCITON", "LATICACIDVEN" in the last 168 hours.  Recent Results (from the past 240 hours)  Resp panel by RT-PCR (RSV, Flu A&B, Covid) Anterior Nasal Swab     Status: None   Collection Time: 01/07/24  9:53 PM   Specimen: Anterior Nasal Swab  Result Value Ref Range Status   SARS Coronavirus 2 by RT PCR NEGATIVE NEGATIVE Final   Influenza A by PCR NEGATIVE NEGATIVE Final   Influenza B by PCR NEGATIVE NEGATIVE Final    Comment: (NOTE) The Xpert Xpress SARS-CoV-2/FLU/RSV plus assay is intended as an aid in the diagnosis of influenza from Nasopharyngeal swab specimens and should not be used as a sole basis for treatment. Nasal washings and aspirates are unacceptable for Xpert Xpress SARS-CoV-2/FLU/RSV testing.  Fact Sheet for Patients: BloggerCourse.com  Fact Sheet for Healthcare Providers: SeriousBroker.it  This test is not yet approved or cleared by the [place] FDA and has been authorized for detection and/or diagnosis of SARS-CoV-2 by FDA under an Emergency Use Authorization (EUA). This EUA will remain in effect (meaning this test can be used) for the duration of the COVID-19 declaration under Section 564(b)(1) of the Act, 21 U.S.C. section 360bbb-3(b)(1), unless the  authorization is terminated or revoked.     Resp Syncytial Virus by PCR NEGATIVE NEGATIVE Final    Comment: (NOTE) Fact Sheet for Patients: BloggerCourse.com  Fact Sheet for Healthcare Providers: SeriousBroker.it  This test is not yet approved or cleared by the [place] FDA and has been authorized for detection and/or diagnosis of SARS-CoV-2 by FDA under an Emergency Use Authorization (EUA). This EUA will remain in effect (meaning this test can be used) for the duration of the COVID-19 declaration under Section 564(b)(1) of the Act, 21 U.S.C. section 360bbb-3(b)(1), unless the authorization is terminated or revoked.  Performed at [facility], [address] [place], [place] [address]          Radiology Studies: US Abdomen Limited RUQ (LIVER/GB) Result Date: 01/07/2024 CLINICAL DATA:  Right upper quadrant abdominal pain. EXAM: ULTRASOUND ABDOMEN LIMITED RIGHT UPPER QUADRANT COMPARISON:  CT scan of same day. FINDINGS: Gallbladder: No gallstones or wall thickening visualized. No sonographic Murphy sign noted by sonographer. Common bile duct: Diameter: 3 mm which is within normal limits. Liver: No focal lesion identified. Within normal limits in parenchymal echogenicity. Portal vein is patent on color Doppler imaging with normal  direction of blood flow towards the liver. Other: None. IMPRESSION: No definite abnormality seen in the right upper quadrant of the abdomen. Electronically Signed   By: Lupita Raider M.D.   On: 01/07/2024 15:41   CT ABDOMEN PELVIS W CONTRAST Result Date: 01/07/2024 CLINICAL DATA:  Epigastric pain. EXAM: CT ABDOMEN AND PELVIS WITH CONTRAST TECHNIQUE: Multidetector CT imaging of the abdomen and pelvis was performed using the standard protocol following bolus administration of intravenous contrast. RADIATION DOSE REDUCTION: This exam was performed according to the departmental dose-optimization program  which includes automated exposure control, adjustment of the mA and/or kV according to patient size and/or use of iterative reconstruction technique. CONTRAST:  60mL OMNIPAQUE IOHEXOL 350 MG/ML SOLN COMPARISON:  CT angiogram 06/25/2023. FINDINGS: Lower chest: There is some linear opacity lung bases likely scar or atelectasis. No pleural effusion. Mild ground-glass as well. Coronary artery calcifications are seen. Patulous esophagus with air and fluid. Hepatobiliary: Dilated gallbladder with some stones. No wall thickening. Preserved hepatic parenchyma. Patent portal vein. Pancreas: Unremarkable. No pancreatic ductal dilatation or surrounding inflammatory changes. Spleen: Normal in size without focal abnormality. Adrenals/Urinary Tract: Slight nodular thickening of the adrenal glands, unchanged from previous. No enhancing renal mass or collecting system dilatation. Underdistended urinary bladder. Bladder wall slightly thickened. Stomach/Bowel: Surgical changes from gastric bypass with a gastrojejunostomy. The excluded portion of the stomach is with some mild luminal fluid. Small bowel is nondilated on this non oral contrast exam. Large bowel has a normal caliber with scattered colonic stool. Redundant course to the colon diffusely particularly the sigmoid colon and transverse colon. Normal caliber appendix. Vascular/Lymphatic: Aortic atherosclerosis. No enlarged abdominal or pelvic lymph nodes. Reproductive: Prostate is unremarkable. Other: No free air or free fluid.  Mild anasarca. Musculoskeletal: Mild degenerative changes along the spine. IMPRESSION: Redundant course of the colon with diffuse stool. Normal appendix. No obstruction. Distended gallbladder with dependent stones. If there is further concern of acute gallbladder pathology ultrasound may be useful as the next step in the workup. Patulous esophagus.  Gastric bypass with a gastrojejunostomy. Electronically Signed   By: Karen Kays M.D.   On: 01/07/2024  14:25   DG Chest 2 View Result Date: 01/07/2024 CLINICAL DATA:  Chest pain EXAM: CHEST - 2 VIEW COMPARISON:  03/28/2022 FINDINGS: Mild left hemidiaphragm elevation. Midline trachea. Normal heart size. Atherosclerosis in the transverse aorta. No pleural effusion or pneumothorax. Clear lungs. IMPRESSION: Aortic Atherosclerosis (ICD10-I70.0). No active cardiopulmonary disease. Electronically Signed   By: Jeronimo Greaves M.D.   On: 01/07/2024 12:43        Scheduled Meds:  atorvastatin  80 mg Oral Daily   cyanocobalamin  1,000 mcg Oral Daily   folic acid  1 mg Oral Daily   insulin aspart  0-6 Units Subcutaneous TID WC   pantoprazole  40 mg Oral BID   Continuous Infusions:   LOS: 0 days    Time spent: 40 minutes    Dorcas Carrow, MD [facility]

## 2024-01-08 NOTE — Care Management Obs Status (Signed)
MEDICARE OBSERVATION STATUS NOTIFICATION   Patient Details  Name: [name] MRN: [id number] Date of Birth: 19-Jul-1956   Medicare Observation Status Notification Given:  Yes    Ronny Bacon, RN 01/08/2024, 10:23 AM

## 2024-01-08 NOTE — Plan of Care (Signed)
  Problem: Fluid Volume: Goal: Ability to maintain a balanced intake and output will improve Outcome: Progressing   Problem: Metabolic: Goal: Ability to maintain appropriate glucose levels will improve Outcome: Progressing   Problem: Nutritional: Goal: Maintenance of adequate nutrition will improve Outcome: Progressing   Problem: Activity: Goal: Risk for activity intolerance will decrease Outcome: Progressing   Problem: Pain Managment: Goal: General experience of comfort will improve and/or be controlled Outcome: Progressing   Problem: Safety: Goal: Ability to remain free from injury will improve Outcome: Progressing

## 2024-01-08 NOTE — Consult Note (Addendum)
Referring Provider: Dr. Lazarus Salines Primary Care Physician:  [name] Levins, MD Primary Gastroenterologist:  Dr. Leone Payor  Reason for Consultation:  Nausea and vomiting  HPI: [name] is a 68 y.o. male Jehovah's Witness, with hx of remote history CAD with diffuse moderate CAD including prox/mid LAD stenosis, history of PE no longer on anticoagulation after GI bleed, prostate cancer s/p radiation, hypertension, hyperlipidemia, diabetes, Roux-en-Y gastric bypass, GERD, who presents with acute onset of epigastric pain after breakfast yesterday with associated profuse vomiting.  Reports that prior to the onset of the symptoms that he has been having issues with nausea and just not feeling well after taking PO/eating.  He saw his PCP and says that they placed him on omeprazole about 6 months ago.  He took it for a while and felt better so stopped it for a bit, sounds like taking it on and off, but sounds like maybe it does help while he is taking it.  Patient had EGD in July 2023 as below that was really unremarkable.  One of the indications for the procedure at that time was actually nausea then as well.   Nonetheless, the day of presentation he had eaten some eggs and then developed acute onset of severe epigastric pain.  Was going to see urgent care but developed nausea and vomiting.  Reports vomiting at least 10 times.  Initially described this as chest pains with sent from the urgent care to the ER.  Laboratory evaluations really unremarkable.  CT scan of the abdomen pelvis with contrast showed a redundant course of the colon with diffuse stool and a patulous esophagus.  Also noted distended gallbladder with dependent stones.  Follow-up ultrasound of the right upper quadrant was performed and was normal, no gallstones.  He tells me that he moves his bowels well, no issues with constipation or diarrhea.  No other abdominal pain.  No dark or bloody stool and no blood noted in his emesis.  Denies NSAID  use.  No blood thinners.  Is feeling better currently with still just remaining soreness in the epigastrium.  EGD 06/2022:  - Normal esophagus. - Gastric bypass with a normal- sized pouch. Gastrojejunal anastomosis characterized by healthy appearing mucosa. - Normal examined jejunum. - No specimens collected.   Past Medical History:  Diagnosis Date   Arthritis    BACK PAIN    CAD (coronary artery disease) 06/28/2017   CAD in native artery    a. moderate by cardiac CT 2016.   Cancer [facility])    Chronic bilateral low back pain with bilateral sciatica 01/12/2019   Degenerative arthritis of right knee 07/15/2016   Injected in 07/15/2016 Orthovisc injection started 10/07/2016 DepoMedrol 40 inj on 04/11/17, 25 cc aspirated Orthovisc injection in 05/30/2017     DEGENERATIVE JOINT DISEASE, KNEES, BILATERAL    Depression    Diabetes (HCC) 10/01/2014   Diabetes mellitus    type II   DIVERTICULOSIS, COLON    DVT, lower extremity (HCC)    right   GERD (gastroesophageal reflux disease)    History of kidney stones    Hyperlipidemia    Hypertension    HYPOGONADISM, MALE    HYPOTENSION, ORTHOSTATIC    Kidney stones    Long term (current) use of anticoagulants 08/17/2017   Long term current use of anticoagulant    Lumbosacral radiculopathy at S1 06/14/2012   Maxillary sinus cyst 12/11/2018   Obesity    OBESITY, MORBID 03/04/2008   Qualifier: Diagnosis of  By:  John MD, Len Blalock    OSA (obstructive sleep apnea) 09/11/2013   Pulmonary emboli (HCC)    Pulmonary embolism (HCC) 02/05/2011   RBBB 07/28/2017   Renal insufficiency    a. prior h/o, does not appear chronic.   Superficial phlebitis of arm 03/18/2016   Vitamin D deficiency 07/09/2020    Past Surgical History:  Procedure Laterality Date   ankle surgury  2001 bilateral   with bone spurs and torn tendon   COLONOSCOPY  03/18/2010   EDG  2008   chronic Duodenitis   FLEXIBLE SIGMOIDOSCOPY N/A 11/03/2021   Procedure: FLEXIBLE  SIGMOIDOSCOPY;  Surgeon: Sherrilyn Rist, MD;  Location: [facility];  Service: Gastroenterology;  Laterality: N/A;   GASTRIC BYPASS     HEEL SPUR SURGERY Bilateral 2005   IR ANGIOGRAM PELVIS SELECTIVE OR SUPRASELECTIVE  11/02/2021   IR ANGIOGRAM PELVIS SELECTIVE OR SUPRASELECTIVE  11/04/2021   IR ANGIOGRAM SELECTIVE EACH ADDITIONAL VESSEL  11/02/2021   IR ANGIOGRAM SELECTIVE EACH ADDITIONAL VESSEL  11/02/2021   IR ANGIOGRAM SELECTIVE EACH ADDITIONAL VESSEL  11/02/2021   IR ANGIOGRAM SELECTIVE EACH ADDITIONAL VESSEL  11/04/2021   IR ANGIOGRAM SELECTIVE EACH ADDITIONAL VESSEL  11/04/2021   IR ANGIOGRAM SELECTIVE EACH ADDITIONAL VESSEL  11/04/2021   IR ANGIOGRAM SELECTIVE EACH ADDITIONAL VESSEL  11/04/2021   IR ANGIOGRAM VISCERAL SELECTIVE  11/02/2021   IR ANGIOGRAM VISCERAL SELECTIVE  11/04/2021   IR EMBO ART  VEN HEMORR LYMPH EXTRAV  INC GUIDE ROADMAPPING  11/02/2021   IR EMBO ART  VEN HEMORR LYMPH EXTRAV  INC GUIDE ROADMAPPING  11/04/2021   IR EMBO ART  VEN HEMORR LYMPH EXTRAV  INC GUIDE ROADMAPPING  11/04/2021   IR RADIOLOGIST EVAL & MGMT  06/01/2023   IR US GUIDE VASC ACCESS RIGHT  11/02/2021   IR US GUIDE VASC ACCESS RIGHT  11/04/2021   knee surgury  2000 & 2003    cartilage damage   LEFT HEART CATH AND CORONARY ANGIOGRAPHY N/A 02/16/2021   Procedure: LEFT HEART CATH AND CORONARY ANGIOGRAPHY;  Surgeon: Tonny Bollman, MD;  Location: [facility] INVASIVE CV LAB;  Service: Cardiovascular;  Laterality: N/A;   LUMBAR LAMINECTOMY/DECOMPRESSION MICRODISCECTOMY  09/28/2012   Procedure: LUMBAR LAMINECTOMY/DECOMPRESSION MICRODISCECTOMY 2 LEVELS;  Surgeon: Drucilla Schmidt, MD;  Location: [facility];  Service: Orthopedics;  Laterality: Left;  Decompressive laminectomy L4-L5. Microdiscectomy L5-S1   Skin removal surgery     extra abdominal skin removed from his weight loss   TONSILLECTOMY AND ADENOIDECTOMY  age 78 or 6   TOTAL KNEE ARTHROPLASTY  01/10/2012   Procedure: TOTAL KNEE ARTHROPLASTY;  Surgeon:  Shelda Pal, MD;  Location: [facility];  Service: Orthopedics;  Laterality: Left;   TOTAL KNEE ARTHROPLASTY Right 11/18/2020   Procedure: TOTAL KNEE ARTHROPLASTY;  Surgeon: Durene Romans, MD;  Location: [facility];  Service: Orthopedics;  Laterality: Right;  70 mins   UPPER GASTROINTESTINAL ENDOSCOPY  03/18/2010    Prior to Admission medications   Medication Sig Start Date End Date Taking? Authorizing Provider  Artificial Tear Ointment (ARTIFICIAL TEARS) ointment Place 1 drop into both eyes every 4 (four) hours as needed (dry eyes).    Yes [provider]  atorvastatin (LIPITOR) 80 MG tablet TAKE 1 TABLET BY MOUTH EVERY DAY 08/03/23  Yes [name] Levins, MD  chlorthalidone (HYGROTON) 25 MG tablet Take 1 tablet (25 mg total) by mouth daily. 09/13/23  Yes [name] Levins, MD  Cholecalciferol (VITAMIN D3) 125 MCG (5000 UT) CAPS Take 5,000 Units  by mouth daily.    Yes [provider]  ferrous sulfate 325 (65 FE) MG EC tablet TAKE 1 TABLET BY MOUTH EVERY DAY 08/24/23  Yes Malachy Mood, MD  folic acid (FOLVITE) 1 MG tablet TAKE 1 TABLET BY MOUTH EVERY DAY 08/06/23  Yes Malachy Mood, MD  glipiZIDE (GLUCOTROL XL) 2.5 MG 24 hr tablet TAKE 1 TABLET BY MOUTH EVERY DAY WITH BREAKFAST 11/12/22  Yes [name] Levins, MD  metFORMIN (GLUCOPHAGE-XR) 500 MG 24 hr tablet Take 4 tablets (2,000 mg total) by mouth daily with breakfast. 09/13/23  Yes [name] Levins, MD  Multiple Vitamin (MULTIVITAMIN WITH MINERALS) TABS tablet Take 1 tablet by mouth daily at 12 noon.   Yes [provider]  nitroGLYCERIN (NITROSTAT) 0.4 MG SL tablet Place 1 tablet (0.4 mg total) under the tongue every 5 (five) minutes as needed for chest pain. 07/30/15  Yes Lars Masson, MD  omeprazole (PRILOSEC) 40 MG capsule Take 1 capsule (40 mg total) by mouth daily. 11/24/23  Yes [name] Levins, MD  vitamin B-12 1000 MCG tablet Take 1 tablet (1,000 mcg total) by mouth daily. 11/08/21  Yes Kathlen Mody, MD  [facility] ULTRA TEST test strip USE  AS INSTRUCTED 07/01/23   [name] Levins, MD    Current Facility-Administered Medications  Medication Dose Route Frequency Provider Last Rate Last Admin   acetaminophen (TYLENOL) tablet 1,000 mg  1,000 mg Oral Q6H PRN Dolly Rias, MD       atorvastatin (LIPITOR) tablet 80 mg  80 mg Oral Daily Segars, Christiane Ha, MD       cyanocobalamin (VITAMIN B12) tablet 1,000 mcg  1,000 mcg Oral Daily Segars, Christiane Ha, MD       folic acid (FOLVITE) tablet 1 mg  1 mg Oral Daily Segars, Christiane Ha, MD       insulin aspart (novoLOG) injection 0-6 Units  0-6 Units Subcutaneous TID WC Dolly Rias, MD       lactated ringers infusion   Intravenous Continuous Dolly Rias, MD 75 mL/hr at 01/08/24 0536 Infusion Verify at 01/08/24 0536   melatonin tablet 6 mg  6 mg Oral QHS PRN Dolly Rias, MD       ondansetron [facility]) injection 4 mg  4 mg Intravenous Q6H PRN Dolly Rias, MD       pantoprazole (PROTONIX) EC tablet 40 mg  40 mg Oral BID Dolly Rias, MD       polyethylene glycol (MIRALAX / GLYCOLAX) packet 17 g  17 g Oral Daily PRN Dolly Rias, MD       prochlorperazine (COMPAZINE) injection 10 mg  10 mg Intravenous Q6H PRN Dolly Rias, MD        Allergies as of 01/07/2024 - Review Complete 01/07/2024  Allergen Reaction Noted   Other Other (See Comments) 09/26/2012   Adhesive [tape] Other (See Comments) 03/22/2016    Family History  Problem Relation Age of Onset   Hypertension Mother    Prostate cancer Father    Diabetes Sister        died with DM January 17, 2001, 2 more sisters with diabetes   Heart failure Sister    Diabetes Brother    Diabetes Sister    Diabetes Sister    Colon cancer Neg Hx    Colon polyps Neg Hx    Esophageal cancer Neg Hx    Rectal cancer Neg Hx    Stomach cancer Neg Hx     Social History   Socioeconomic History   Marital status: Married  Spouse name: Not on file   Number of children: 2   Years of education: Not on file   Highest education level:  12th grade  Occupational History   Occupation: Retired    Associate Professor: [employer]  Tobacco Use   Smoking status: Former    Current packs/day: 0.00    Average packs/day: 0.3 packs/day for 5.0 years (1.3 ttl pk-yrs)    Types: Cigarettes    Start date: 12/06/1973    Quit date: 12/06/1978    Years since quitting: 45.1   Smokeless tobacco: Never  Vaping Use   Vaping status: Never Used  Substance and Sexual Activity   Alcohol use: Yes    Alcohol/week: 2.0 standard drinks of alcohol    Types: 2 Standard drinks or equivalent per week   Drug use: No   Sexual activity: Not Currently  Other Topics Concern   Not on file  Social History Narrative   Daily caffeine use one per day   Protein drinks    Children; a friend and sister that helps   Brother that is around [place])   Son and dtr in [place]   Lives w/ wife in [place]   Left handed     Retired from [employer]   Former smoker 2 alcoholic beverages daily no caffeine no drug use no other tobacco at this time   Social Drivers of [employer]: Low Risk  (11/20/2023)   Overall Financial Resource Strain (CARDIA)    Difficulty of Paying Living Expenses: Not very hard  Food Insecurity: No Food Insecurity (01/07/2024)   Hunger Vital Sign    Worried About Running Out of Food in the Last Year: Never true    Ran Out of Food in the Last Year: Never true  Transportation Needs: No Transportation Needs (01/07/2024)   PRAPARE - Administrator, Civil Service (Medical): No    Lack of Transportation (Non-Medical): No  Physical Activity: Sufficiently Active (11/20/2023)   Exercise Vital Sign    Days of Exercise per Week: 3 days    Minutes of Exercise per Session: 120 min  Stress: No Stress Concern Present (11/20/2023)   [employer] of Occupational Health - Occupational Stress Questionnaire    Feeling of Stress : Not at all  Social Connections: Moderately Integrated (01/07/2024)   Social Connection and Isolation  Panel [NHANES]    Frequency of Communication with Friends and Family: Once a week    Frequency of Social Gatherings with Friends and Family: Once a week    Attends Religious Services: More than 4 times per year    Active Member of [employer] or Organizations: Yes    Attends Engineer, structural: More than 4 times per year    Marital Status: Married  Catering manager Violence: Not At Risk (01/07/2024)   Humiliation, Afraid, Rape, and Kick questionnaire    Fear of Current or Ex-Partner: No    Emotionally Abused: No    Physically Abused: No    Sexually Abused: No    Review of Systems: ROS is O/W negative except as mentioned in HPI.  Physical Exam: Vital signs in last 24 hours: Temp:  [98 F (36.7 C)-98.7 F (37.1 C)] 98 F (36.7 C) (02/02 0450) Pulse Rate:  [44-75] 44 (02/02 0810) Resp:  [10-22] 16 (02/02 0810) BP: (116-166)/(68-85) 117/68 (02/02 0810) SpO2:  [92 %-100 %] 98 % (02/02 0810) Weight:  [116.9 kg] 116.9 kg (02/01 2134)   General:  Alert, Well-developed, well-nourished, pleasant  and cooperative in NAD Head:  Normocephalic and atraumatic. Eyes:  Sclera clear, no icterus.  Conjunctiva pink. Ears:  Normal auditory acuity. Mouth:  No deformity or lesions.   Lungs:  Clear throughout to auscultation.  No wheezes, crackles, or rhonchi.  Heart:  Regular rate and rhythm; no murmurs, clicks, rubs, or gallops. Abdomen:  Soft, non-distended.  BS present.  Minimal TTP in epigastrium.   Rectal:  Deferred.  Heme negative in the ED.  Msk:  Symmetrical without gross deformities. Pulses:  Normal pulses noted. Extremities:  Without clubbing or edema. Neurologic:  Alert and oriented x 4;  grossly normal neurologically. Skin:  Intact without significant lesions or rashes. Psych:  Alert and cooperative. Normal mood and affect.  Intake/Output from previous day: 02/01 0701 - 02/02 0700 In: 1031.8 [P.O.:480; I.V.:551.8] Out: -   Lab Results: Recent Labs    01/07/24 1003  01/08/24 0748  WBC 6.0 6.0  HGB 13.0 12.6*  HCT 43.2 40.4  PLT 185 162   BMET Recent Labs    01/07/24 1003 01/08/24 0747  NA 139 136  K 4.7 4.0  CL 104 103  CO2 23 24  GLUCOSE 152* 103*  BUN 26* 19  CREATININE 1.59* 1.25*  CALCIUM 9.3 9.1   LFT Recent Labs    01/08/24 0747  PROT 6.2*  ALBUMIN 3.6  AST 19  ALT 31  ALKPHOS 73  BILITOT 0.9  BILIDIR 0.1  IBILI 0.8   Studies/Results: US Abdomen Limited RUQ (LIVER/GB) Result Date: 01/07/2024 CLINICAL DATA:  Right upper quadrant abdominal pain. EXAM: ULTRASOUND ABDOMEN LIMITED RIGHT UPPER QUADRANT COMPARISON:  CT scan of same day. FINDINGS: Gallbladder: No gallstones or wall thickening visualized. No sonographic Murphy sign noted by sonographer. Common bile duct: Diameter: 3 mm which is within normal limits. Liver: No focal lesion identified. Within normal limits in parenchymal echogenicity. Portal vein is patent on color Doppler imaging with normal direction of blood flow towards the liver. Other: None. IMPRESSION: No definite abnormality seen in the right upper quadrant of the abdomen. Electronically Signed   By: Lupita Raider M.D.   On: 01/07/2024 15:41   CT ABDOMEN PELVIS W CONTRAST Result Date: 01/07/2024 CLINICAL DATA:  Epigastric pain. EXAM: CT ABDOMEN AND PELVIS WITH CONTRAST TECHNIQUE: Multidetector CT imaging of the abdomen and pelvis was performed using the standard protocol following bolus administration of intravenous contrast. RADIATION DOSE REDUCTION: This exam was performed according to the departmental dose-optimization program which includes automated exposure control, adjustment of the mA and/or kV according to patient size and/or use of iterative reconstruction technique. CONTRAST:  60mL OMNIPAQUE IOHEXOL 350 MG/ML SOLN COMPARISON:  CT angiogram 06/25/2023. FINDINGS: Lower chest: There is some linear opacity lung bases likely scar or atelectasis. No pleural effusion. Mild ground-glass as well. Coronary artery  calcifications are seen. Patulous esophagus with air and fluid. Hepatobiliary: Dilated gallbladder with some stones. No wall thickening. Preserved hepatic parenchyma. Patent portal vein. Pancreas: Unremarkable. No pancreatic ductal dilatation or surrounding inflammatory changes. Spleen: Normal in size without focal abnormality. Adrenals/Urinary Tract: Slight nodular thickening of the adrenal glands, unchanged from previous. No enhancing renal mass or collecting system dilatation. Underdistended urinary bladder. Bladder wall slightly thickened. Stomach/Bowel: Surgical changes from gastric bypass with a gastrojejunostomy. The excluded portion of the stomach is with some mild luminal fluid. Small bowel is nondilated on this non oral contrast exam. Large bowel has a normal caliber with scattered colonic stool. Redundant course to the colon diffusely particularly the sigmoid colon and  transverse colon. Normal caliber appendix. Vascular/Lymphatic: Aortic atherosclerosis. No enlarged abdominal or pelvic lymph nodes. Reproductive: Prostate is unremarkable. Other: No free air or free fluid.  Mild anasarca. Musculoskeletal: Mild degenerative changes along the spine. IMPRESSION: Redundant course of the colon with diffuse stool. Normal appendix. No obstruction. Distended gallbladder with dependent stones. If there is further concern of acute gallbladder pathology ultrasound may be useful as the next step in the workup. Patulous esophagus.  Gastric bypass with a gastrojejunostomy. Electronically Signed   By: Karen Kays M.D.   On: 01/07/2024 14:25   DG Chest 2 View Result Date: 01/07/2024 CLINICAL DATA:  Chest pain EXAM: CHEST - 2 VIEW COMPARISON:  03/28/2022 FINDINGS: Mild left hemidiaphragm elevation. Midline trachea. Normal heart size. Atherosclerosis in the transverse aorta. No pleural effusion or pneumothorax. Clear lungs. IMPRESSION: Aortic Atherosclerosis (ICD10-I70.0). No active cardiopulmonary disease. Electronically  Signed   By: Jeronimo Greaves M.D.   On: 01/07/2024 12:43   IMPRESSION:  *68 year old male presenting with sudden onset of epigastric abdominal pain with associated nausea and vomiting.  Has never experienced any similar symptoms in the past, however, has had some chronic issues with maybe some low-grade nausea and just not feeling well after eating.  Was placed on omeprazole by his PCP some months ago.  IT sounds like he takes it for a while and it helps, but then discontinues it for a while until symptoms return, taking it for periods of time on an as-needed basis.  CT scan showing patulous esophagus, otherwise unremarkable.  Ultrasound looks good.  HIDA scan has been ordered by the hospitalist team.  Currently he is feeling better.  Has had an EGD in July 2023 which looked good.  It appears at the time of that procedure he was having complaints of nausea as well. *Status post Roux-en-Y gastric bypass  PLAN: -Will allow him to have clear liquids today then n.p.o. after midnight. -Will plan for tentative EGD with Dr. Doy Hutching in 2/3. -HIDA scan has been ordered by hospitalist. -They have him on pantoprazole 40 mg p.o. twice daily here which is fine. -Antiemetics as needed.  Princella Pellegrini. Zehr  01/08/2024, 9:30 AM  I have taken an interval history, thoroughly reviewed the chart and examined the patient. I agree with the Advanced Practitioner's note, impression and recommendations, and have recorded additional findings, impressions and recommendations below. I performed a substantive portion of this encounter (>50% time spent), including a complete performance of the medical decision making.  My additional thoughts are as follows:  His chronic periumbilical postprandial dyspepsia is somewhat difficult to characterize, no source seen on CT. Severe epigastric pain and vomiting yesterday shortly after eating some eggs.  No acute cardiac cause Somewhat patulous esophagus seen on CT, of unclear duration in  relation to the reported symptoms. Radiologist felt there were gallstones on the initial CT, but then none seen on ultrasound, which is more sensitive for stones in the gallbladder. HIDA scan pending to rule out biliary dyskinesia, but this is really his first episode of this sort of pain and LFTs are normal, so seems not likely to be biliary cause.  This may have been an isolated episode since he does not describe chronic dysphagia or vomiting.  Nevertheless, given the severity of the symptoms and the unclear cause and relation of the esophageal findings, we have scheduled him an EGD tomorrow.  He was agreeable after discussion of procedure and risks.  The benefits and risks of the planned procedure were described in  detail with the patient or (when appropriate) their health care proxy.  Risks were outlined as including, but not limited to, bleeding, infection, perforation, adverse medication reaction leading to cardiac or pulmonary decompensation, pancreatitis (if ERCP).  The limitation of incomplete mucosal visualization was also discussed.  No guarantees or warranties were given.   Izrael Pitter III [phone]

## 2024-01-09 ENCOUNTER — Inpatient Hospital Stay (HOSPITAL_COMMUNITY): Payer: PPO

## 2024-01-09 ENCOUNTER — Encounter (HOSPITAL_COMMUNITY): Payer: Self-pay | Admitting: Internal Medicine

## 2024-01-09 ENCOUNTER — Inpatient Hospital Stay (HOSPITAL_COMMUNITY): Payer: PPO | Admitting: Anesthesiology

## 2024-01-09 ENCOUNTER — Encounter (HOSPITAL_COMMUNITY)
Admission: EM | Disposition: A | Discharge status: Home / Self Care | Payer: Self-pay | Source: Home / Self Care | Attending: Internal Medicine

## 2024-01-09 DIAGNOSIS — N183 Chronic kidney disease, stage 3 unspecified: Secondary | ICD-10-CM

## 2024-01-09 DIAGNOSIS — R112 Nausea with vomiting, unspecified: Secondary | ICD-10-CM

## 2024-01-09 DIAGNOSIS — Z980 Intestinal bypass and anastomosis status: Secondary | ICD-10-CM

## 2024-01-09 DIAGNOSIS — R1013 Epigastric pain: Secondary | ICD-10-CM | POA: Diagnosis not present

## 2024-01-09 DIAGNOSIS — K3189 Other diseases of stomach and duodenum: Secondary | ICD-10-CM

## 2024-01-09 DIAGNOSIS — R1110 Vomiting, unspecified: Secondary | ICD-10-CM | POA: Diagnosis not present

## 2024-01-09 DIAGNOSIS — I129 Hypertensive chronic kidney disease with stage 1 through stage 4 chronic kidney disease, or unspecified chronic kidney disease: Secondary | ICD-10-CM

## 2024-01-09 DIAGNOSIS — I2510 Atherosclerotic heart disease of native coronary artery without angina pectoris: Secondary | ICD-10-CM | POA: Diagnosis not present

## 2024-01-09 HISTORY — PX: BIOPSY: SHX5522

## 2024-01-09 HISTORY — PX: ESOPHAGOGASTRODUODENOSCOPY (EGD) WITH PROPOFOL: SHX5813

## 2024-01-09 LAB — GLUCOSE, CAPILLARY
Glucose-Capillary: 112 mg/dL — ABNORMAL HIGH (ref 70–99)
Glucose-Capillary: 114 mg/dL — ABNORMAL HIGH (ref 70–99)
Glucose-Capillary: 136 mg/dL — ABNORMAL HIGH (ref 70–99)
Glucose-Capillary: 116 mg/dL — ABNORMAL HIGH (ref 70–99)
Glucose-Capillary: 238 mg/dL — ABNORMAL HIGH (ref 70–99)

## 2024-01-09 SURGERY — ESOPHAGOGASTRODUODENOSCOPY (EGD) WITH PROPOFOL
Anesthesia: Monitor Anesthesia Care

## 2024-01-09 MED ORDER — TECHNETIUM TC 99M MEBROFENIN IV KIT
5.4000 | PACK | Freq: Once | INTRAVENOUS | Status: AC | PRN
  Administered 2024-01-09: 5.4 via INTRAVENOUS

## 2024-01-09 MED ORDER — LIDOCAINE 2% (20 MG/ML) 5 ML SYRINGE
INTRAMUSCULAR | Status: DC | PRN
  Administered 2024-01-09: 50 mg via INTRAVENOUS

## 2024-01-09 MED ORDER — PROPOFOL 10 MG/ML IV BOLUS
INTRAVENOUS | Status: DC | PRN
  Administered 2024-01-09: 50 mg via INTRAVENOUS

## 2024-01-09 MED ORDER — PROPOFOL 500 MG/50ML IV EMUL
INTRAVENOUS | Status: DC | PRN
  Administered 2024-01-09: 50 ug/kg/min via INTRAVENOUS

## 2024-01-09 MED ORDER — SODIUM CHLORIDE 0.9 % IV SOLN: INTRAVENOUS | Status: DC | PRN

## 2024-01-09 MED ORDER — OMEPRAZOLE 40 MG PO CPDR: 40.0000 mg | DELAYED_RELEASE_CAPSULE | Freq: Two times a day (BID) | ORAL | 3 refills | Status: AC

## 2024-01-09 SURGICAL SUPPLY — 14 items

## 2024-01-09 NOTE — H&P (Signed)
[facility] History and Physical   Primary Care Physician:  Corwin Levins, MD   Reason for Procedure:  Abdominal pain, nausea, vomiting, history of Roux-en-Y gastric bypass  Plan:    Upper endoscopy     HPI: [name] is a 68 y.o. male undergoing upper endoscopy to evaluate symptoms of epigastric abdominal pain nausea and vomiting.  Patient has a remote history of Roux-en-Y gastric bypass.  Reports that after eating a hard-boiled egg he developed rapid onset of sharp epigastric abdominal pain followed by repeated episodes of nausea and vomiting.  No hematemesis.  No finding of intestinal obstruction, adhesions or internal hernias on CT scan performed on admission.  Patient has had upper endoscopies before that have not revealed significant pathology.   Past Medical History:  Diagnosis Date   Arthritis    BACK PAIN    CAD (coronary artery disease) 06/28/2017   CAD in native artery    a. moderate by cardiac CT 2016.   Cancer [facility])    Chronic bilateral low back pain with bilateral sciatica 01/12/2019   Degenerative arthritis of right knee 07/15/2016   Injected in 07/15/2016 Orthovisc injection started 10/07/2016 DepoMedrol 40 inj on 04/11/17, 25 cc aspirated Orthovisc injection in 05/30/2017     DEGENERATIVE JOINT DISEASE, KNEES, BILATERAL    Depression    Diabetes (HCC) 10/01/2014   Diabetes mellitus    type II   DIVERTICULOSIS, COLON    DVT, lower extremity (HCC)    right   GERD (gastroesophageal reflux disease)    History of kidney stones    Hyperlipidemia    Hypertension    HYPOGONADISM, MALE    HYPOTENSION, ORTHOSTATIC    Kidney stones    Long term (current) use of anticoagulants 08/17/2017   Long term current use of anticoagulant    Lumbosacral radiculopathy at S1 06/14/2012   Maxillary sinus cyst 12/11/2018   Obesity    OBESITY, MORBID 03/04/2008   Qualifier: Diagnosis of  By: Jonny Ruiz MD, Len Blalock    OSA (obstructive sleep apnea) 09/11/2013    Pulmonary emboli (HCC)    Pulmonary embolism (HCC) 02/05/2011   RBBB 07/28/2017   Renal insufficiency    a. prior h/o, does not appear chronic.   Superficial phlebitis of arm 03/18/2016   Vitamin D deficiency 07/09/2020    Past Surgical History:  Procedure Laterality Date   ankle surgury  2001 bilateral   with bone spurs and torn tendon   COLONOSCOPY  03/18/2010   EDG  2008   chronic Duodenitis   FLEXIBLE SIGMOIDOSCOPY N/A 11/03/2021   Procedure: FLEXIBLE SIGMOIDOSCOPY;  Surgeon: Sherrilyn Rist, MD;  Location: [facility];  Service: Gastroenterology;  Laterality: N/A;   GASTRIC BYPASS     HEEL SPUR SURGERY Bilateral 2005   IR ANGIOGRAM PELVIS SELECTIVE OR SUPRASELECTIVE  11/02/2021   IR ANGIOGRAM PELVIS SELECTIVE OR SUPRASELECTIVE  11/04/2021   IR ANGIOGRAM SELECTIVE EACH ADDITIONAL VESSEL  11/02/2021   IR ANGIOGRAM SELECTIVE EACH ADDITIONAL VESSEL  11/02/2021   IR ANGIOGRAM SELECTIVE EACH ADDITIONAL VESSEL  11/02/2021   IR ANGIOGRAM SELECTIVE EACH ADDITIONAL VESSEL  11/04/2021   IR ANGIOGRAM SELECTIVE EACH ADDITIONAL VESSEL  11/04/2021   IR ANGIOGRAM SELECTIVE EACH ADDITIONAL VESSEL  11/04/2021   IR ANGIOGRAM SELECTIVE EACH ADDITIONAL VESSEL  11/04/2021   IR ANGIOGRAM VISCERAL SELECTIVE  11/02/2021   IR ANGIOGRAM VISCERAL SELECTIVE  11/04/2021   IR EMBO ART  VEN HEMORR LYMPH EXTRAV  INC GUIDE ROADMAPPING  11/02/2021  IR EMBO ART  VEN HEMORR LYMPH EXTRAV  INC GUIDE ROADMAPPING  11/04/2021   IR EMBO ART  VEN HEMORR LYMPH EXTRAV  INC GUIDE ROADMAPPING  11/04/2021   IR RADIOLOGIST EVAL & MGMT  06/01/2023   IR US GUIDE VASC ACCESS RIGHT  11/02/2021   IR US GUIDE VASC ACCESS RIGHT  11/04/2021   knee surgury  2000 & 2003    cartilage damage   LEFT HEART CATH AND CORONARY ANGIOGRAPHY N/A 02/16/2021   Procedure: LEFT HEART CATH AND CORONARY ANGIOGRAPHY;  Surgeon: Tonny Bollman, MD;  Location: [facility] INVASIVE CV LAB;  Service: Cardiovascular;  Laterality: N/A;   LUMBAR  LAMINECTOMY/DECOMPRESSION MICRODISCECTOMY  09/28/2012   Procedure: LUMBAR LAMINECTOMY/DECOMPRESSION MICRODISCECTOMY 2 LEVELS;  Surgeon: Drucilla Schmidt, MD;  Location: [facility];  Service: Orthopedics;  Laterality: Left;  Decompressive laminectomy L4-L5. Microdiscectomy L5-S1   Skin removal surgery     extra abdominal skin removed from his weight loss   TONSILLECTOMY AND ADENOIDECTOMY  age 30 or 6   TOTAL KNEE ARTHROPLASTY  01/10/2012   Procedure: TOTAL KNEE ARTHROPLASTY;  Surgeon: Shelda Pal, MD;  Location: [facility];  Service: Orthopedics;  Laterality: Left;   TOTAL KNEE ARTHROPLASTY Right 11/18/2020   Procedure: TOTAL KNEE ARTHROPLASTY;  Surgeon: Durene Romans, MD;  Location: [facility];  Service: Orthopedics;  Laterality: Right;  70 mins   UPPER GASTROINTESTINAL ENDOSCOPY  03/18/2010    Prior to Admission medications   Medication Sig Start Date End Date Taking? Authorizing Provider  Artificial Tear Ointment (ARTIFICIAL TEARS) ointment Place 1 drop into both eyes every 4 (four) hours as needed (dry eyes).    Yes [provider]  atorvastatin (LIPITOR) 80 MG tablet TAKE 1 TABLET BY MOUTH EVERY DAY 08/03/23  Yes Corwin Levins, MD  chlorthalidone (HYGROTON) 25 MG tablet Take 1 tablet (25 mg total) by mouth daily. 09/13/23  Yes Corwin Levins, MD  Cholecalciferol (VITAMIN D3) 125 MCG (5000 UT) CAPS Take 5,000 Units by mouth daily.    Yes [provider]  ferrous sulfate 325 (65 FE) MG EC tablet TAKE 1 TABLET BY MOUTH EVERY DAY 08/24/23  Yes Malachy Mood, MD  folic acid (FOLVITE) 1 MG tablet TAKE 1 TABLET BY MOUTH EVERY DAY 08/06/23  Yes Malachy Mood, MD  glipiZIDE (GLUCOTROL XL) 2.5 MG 24 hr tablet TAKE 1 TABLET BY MOUTH EVERY DAY WITH BREAKFAST 11/12/22  Yes Corwin Levins, MD  metFORMIN (GLUCOPHAGE-XR) 500 MG 24 hr tablet Take 4 tablets (2,000 mg total) by mouth daily with breakfast. 09/13/23  Yes Corwin Levins, MD  Multiple Vitamin (MULTIVITAMIN WITH MINERALS) TABS tablet Take 1 tablet by mouth  daily at 12 noon.   Yes [provider]  nitroGLYCERIN (NITROSTAT) 0.4 MG SL tablet Place 1 tablet (0.4 mg total) under the tongue every 5 (five) minutes as needed for chest pain. 07/30/15  Yes Lars Masson, MD  omeprazole (PRILOSEC) 40 MG capsule Take 1 capsule (40 mg total) by mouth daily. 11/24/23  Yes Corwin Levins, MD  vitamin B-12 1000 MCG tablet Take 1 tablet (1,000 mcg total) by mouth daily. 11/08/21  Yes Kathlen Mody, MD  [facility] ULTRA TEST test strip USE AS INSTRUCTED 07/01/23   Corwin Levins, MD    Current Facility-Administered Medications  Medication Dose Route Frequency Provider Last Rate Last Admin   [MAR Hold] acetaminophen (TYLENOL) tablet 1,000 mg  1,000 mg Oral Q6H PRN Dolly Rias, MD       [facility] Hold]  atorvastatin (LIPITOR) tablet 80 mg  80 mg Oral Daily Dolly Rias, MD   80 mg at 01/08/24 1148   [MAR Hold] cyanocobalamin (VITAMIN B12) tablet 1,000 mcg  1,000 mcg Oral Daily Dolly Rias, MD   1,000 mcg at 01/08/24 1148   [MAR Hold] folic acid (FOLVITE) tablet 1 mg  1 mg Oral Daily Segars, Christiane Ha, MD   1 mg at 01/08/24 1148   [MAR Hold] insulin aspart (novoLOG) injection 0-6 Units  0-6 Units Subcutaneous TID WC Dolly Rias, MD   1 Units at 01/08/24 1715   [MAR Hold] melatonin tablet 6 mg  6 mg Oral QHS PRN Dolly Rias, MD       [facility] Hold] ondansetron [facility]) injection 4 mg  4 mg Intravenous Q6H PRN Dolly Rias, MD       [facility] Hold] pantoprazole (PROTONIX) EC tablet 40 mg  40 mg Oral BID Dolly Rias, MD   40 mg at 01/08/24 2151-02-03   [facility] Hold] polyethylene glycol (MIRALAX / GLYCOLAX) packet 17 g  17 g Oral Daily PRN Dolly Rias, MD       [facility] Hold] prochlorperazine (COMPAZINE) injection 10 mg  10 mg Intravenous Q6H PRN Dolly Rias, MD        Allergies as of 01/07/2024 - Review Complete 01/07/2024  Allergen Reaction Noted   Other Other (See Comments) 09/26/2012   Adhesive [tape] Other (See Comments) 03/22/2016     Family History  Problem Relation Age of Onset   Hypertension Mother    Prostate cancer Father    Diabetes Sister        died with DM 2001/02/03, 2 more sisters with diabetes   Heart failure Sister    Diabetes Brother    Diabetes Sister    Diabetes Sister    Colon cancer Neg Hx    Colon polyps Neg Hx    Esophageal cancer Neg Hx    Rectal cancer Neg Hx    Stomach cancer Neg Hx     Social History   Socioeconomic History   Marital status: Married    Spouse name: Not on file   Number of children: 2   Years of education: Not on file   Highest education level: 12th grade  Occupational History   Occupation: Retired    Associate Professor: Training and development officer  Tobacco Use   Smoking status: Former    Current packs/day: 0.00    Average packs/day: 0.3 packs/day for 5.0 years (1.3 ttl pk-yrs)    Types: Cigarettes    Start date: 12/06/1973    Quit date: 12/06/1978    Years since quitting: 45.1   Smokeless tobacco: Never  Vaping Use   Vaping status: Never Used  Substance and Sexual Activity   Alcohol use: Yes    Alcohol/week: 2.0 standard drinks of alcohol    Types: 2 Standard drinks or equivalent per week   Drug use: No   Sexual activity: Not Currently  Other Topics Concern   Not on file  Social History Narrative   Daily caffeine use one per day   Protein drinks    Children; a friend and sister that helps   Brother that is around [place])   Son and dtr in [place]   Lives w/ wife in [place]   Left handed     Retired from [employer]   Former smoker 2 alcoholic beverages daily no caffeine no drug use no other tobacco at this time   Social Drivers of [employer]: Low Risk  (  11/20/2023)   Overall Financial Resource Strain (CARDIA)    Difficulty of Paying Living Expenses: Not very hard  Food Insecurity: No Food Insecurity (01/07/2024)   Hunger Vital Sign    Worried About Running Out of Food in the Last Year: Never true    Ran Out of Food in the Last Year: Never true   Transportation Needs: No Transportation Needs (01/07/2024)   PRAPARE - Administrator, Civil Service (Medical): No    Lack of Transportation (Non-Medical): No  Physical Activity: Sufficiently Active (11/20/2023)   Exercise Vital Sign    Days of Exercise per Week: 3 days    Minutes of Exercise per Session: 120 min  Stress: No Stress Concern Present (11/20/2023)   [employer] of Occupational Health - Occupational Stress Questionnaire    Feeling of Stress : Not at all  Social Connections: Moderately Integrated (01/07/2024)   Social Connection and Isolation Panel [NHANES]    Frequency of Communication with Friends and Family: Once a week    Frequency of Social Gatherings with Friends and Family: Once a week    Attends Religious Services: More than 4 times per year    Active Member of [employer] or Organizations: Yes    Attends Engineer, structural: More than 4 times per year    Marital Status: Married  Catering manager Violence: Not At Risk (01/07/2024)   Humiliation, Afraid, Rape, and Kick questionnaire    Fear of Current or Ex-Partner: No    Emotionally Abused: No    Physically Abused: No    Sexually Abused: No    Review of Systems:  All other review of systems negative except as mentioned in the HPI.  Physical Exam: Vital signs BP 136/86   Pulse (!) 49   Temp (!) 97.1 F (36.2 C) (Temporal)   Resp 19   Ht 6\' 2"  (1.88 m)   Wt 116.9 kg   SpO2 96%   BMI 33.09 kg/m   General:   Alert,  Well-developed, well-nourished, pleasant and cooperative in NAD Lungs:  Clear throughout to auscultation.   Heart:  Regular rate and rhythm; no murmurs, clicks, rubs,  or gallops. Abdomen:  Soft, nontender and nondistended. Normal bowel sounds.   Neuro/Psych:  Normal mood and affect. A and O x 3  Lab Results  Component Value Date   WBC 6.0 01/08/2024   HGB 12.6 (L) 01/08/2024   HCT 40.4 01/08/2024   MCV 71.6 (L) 01/08/2024   PLT 162 01/08/2024   Lab Results   Component Value Date   ALT 31 01/08/2024   AST 19 01/08/2024   ALKPHOS 73 01/08/2024   BILITOT 0.9 01/08/2024   Lab Results  Component Value Date   INR 1.2 (H) 06/22/2023   INR 1.2 11/03/2021   INR 1.5 (H) 11/02/2021   PROTIME 18.2 05/16/2009   PROTIME 14.7 05/07/2009     Maren Beach, MD [facility]

## 2024-01-09 NOTE — Progress Notes (Signed)
Pt arrived back to 6 north room 29 alert and oriented x4. Wife at bedside. Lunch tray ordered. Will monitor pt to see how he tolerates food when it arrives.

## 2024-01-09 NOTE — Discharge Summary (Signed)
Physician Discharge Summary  ALIC HILBURN ZOX:[id number] DOB: [birth year] DOA: 01/07/2024  PCP: Corwin Levins, MD  Admit date: 01/07/2024 Discharge date: 01/09/2024  Admitted From: Home Disposition: Home  Recommendations for Outpatient Follow-up:  Follow up with PCP in 1-2 weeks GI to follow-up  Home Health: N/A Equipment/Devices: N/A  Discharge Condition: Stable CODE STATUS: Full code Diet recommendation: Regular diet, small frequent meals,  Discharge summary: 68 year old gentleman with history of coronary artery disease, history of PE currently not on anticoagulation, prostate cancer status postradiation hypertension hyperlipidemia, type 2 diabetes, Roux-en-Y gastric bypass and GERD presented with acute onset of epigastric pain after eating breakfast and profuse vomiting. Seen in the emergency room. Hemodynamically stable. Admitted due to significant symptoms.   Symptomatically treated.  IV fluids and gradually on liquid diet.  Underwent upper GI endoscopy and found to have scant ulceration at the suture line in the stomach but no active bleeding. Underwent HIDA scan with abnormal CT scan of the gallbladder and this was essentially normal.  Normal ejection fraction.  No acute cholecystitis.  Currently pain improved.  Likely atypical pain/dyspepsia. Plan: Tolerating regular diet.  Asymptomatic today.  Discussed GERD precautions. On omeprazole 40 mg daily, will advised to take twice daily. Continue all home medications.  GI to schedule follow-up.   Discharge Diagnoses:  Principal Problem:   Intractable vomiting Active Problems:   Chest pain   Intractable nausea and vomiting    Discharge Instructions  Discharge Instructions     Diet - low sodium heart healthy   Complete by: As directed    Increase activity slowly   Complete by: As directed       Allergies as of 01/09/2024       Reactions   Other Other (See Comments)   NO BLOOD -JEHOVAH'S WITNESS-SIGNED REFUSAL BUT  WOULD TAKE ALBUMIN   Adhesive [tape] Other (See Comments)   Painful, Please use "paper" tape        Medication List     TAKE these medications    artificial tears ointment Place 1 drop into both eyes every 4 (four) hours as needed (dry eyes).   atorvastatin 80 MG tablet Commonly known as: LIPITOR TAKE 1 TABLET BY MOUTH EVERY DAY   chlorthalidone 25 MG tablet Commonly known as: HYGROTON Take 1 tablet (25 mg total) by mouth daily.   cyanocobalamin 1000 MCG tablet Take 1 tablet (1,000 mcg total) by mouth daily.   ferrous sulfate 325 (65 FE) MG EC tablet TAKE 1 TABLET BY MOUTH EVERY DAY   folic acid 1 MG tablet Commonly known as: FOLVITE TAKE 1 TABLET BY MOUTH EVERY DAY   glipiZIDE 2.5 MG 24 hr tablet Commonly known as: GLUCOTROL XL TAKE 1 TABLET BY MOUTH EVERY DAY WITH BREAKFAST   metFORMIN 500 MG 24 hr tablet Commonly known as: GLUCOPHAGE-XR Take 4 tablets (2,000 mg total) by mouth daily with breakfast.   multivitamin with minerals Tabs tablet Take 1 tablet by mouth daily at 12 noon.   nitroGLYCERIN 0.4 MG SL tablet Commonly known as: NITROSTAT Place 1 tablet (0.4 mg total) under the tongue every 5 (five) minutes as needed for chest pain.   omeprazole 40 MG capsule Commonly known as: PRILOSEC Take 1 capsule (40 mg total) by mouth 2 (two) times daily. What changed: when to take this   OneTouch Ultra Test test strip Generic drug: glucose blood USE AS INSTRUCTED   Vitamin D3 125 MCG (5000 UT) Caps Take 5,000 Units by mouth daily.  Allergies  Allergen Reactions   Other Other (See Comments)    NO BLOOD -JEHOVAH'S WITNESS-SIGNED REFUSAL BUT WOULD TAKE ALBUMIN   Adhesive [Tape] Other (See Comments)    Painful, Please use "paper" tape    Consultations: Gastroenterology   Procedures/Studies: NM Hepatobiliary Liver Func Result Date: 01/09/2024 CLINICAL DATA:  RIGHT upper quadrant pain. Cholelithiasis. biliary disease suspected. EXAM: NUCLEAR  MEDICINE HEPATOBILIARY IMAGING TECHNIQUE: Sequential images of the abdomen were obtained out to 60 minutes following intravenous administration of radiopharmaceutical. RADIOPHARMACEUTICALS:  5.4 mCi Tc-75m  Choletec IV COMPARISON:  None Available. FINDINGS: Prompt clearance of radiotracer from blood pool and homogeneous uptake in liver. Gallbladder is evident at 10 minutes. The gallbladder continues to fill over 90 minutes of imaging. The common bile duct is clearly evident but no activity in the bowel. Ensure fatty meal was administered. Gallbladder contracted with counts evident in the small bowel. IMPRESSION: 1. Patent cystic duct.  No evidence acute cholecystitis. 2. Patent common bile duct. Electronically Signed   By: Genevive Bi M.D.   On: 01/09/2024 16:18   US Abdomen Limited RUQ (LIVER/GB) Result Date: 01/07/2024 CLINICAL DATA:  Right upper quadrant abdominal pain. EXAM: ULTRASOUND ABDOMEN LIMITED RIGHT UPPER QUADRANT COMPARISON:  CT scan of same day. FINDINGS: Gallbladder: No gallstones or wall thickening visualized. No sonographic Murphy sign noted by sonographer. Common bile duct: Diameter: 3 mm which is within normal limits. Liver: No focal lesion identified. Within normal limits in parenchymal echogenicity. Portal vein is patent on color Doppler imaging with normal direction of blood flow towards the liver. Other: None. IMPRESSION: No definite abnormality seen in the right upper quadrant of the abdomen. Electronically Signed   By: Lupita Raider M.D.   On: 01/07/2024 15:41   CT ABDOMEN PELVIS W CONTRAST Result Date: 01/07/2024 CLINICAL DATA:  Epigastric pain. EXAM: CT ABDOMEN AND PELVIS WITH CONTRAST TECHNIQUE: Multidetector CT imaging of the abdomen and pelvis was performed using the standard protocol following bolus administration of intravenous contrast. RADIATION DOSE REDUCTION: This exam was performed according to the departmental dose-optimization program which includes automated  exposure control, adjustment of the mA and/or kV according to patient size and/or use of iterative reconstruction technique. CONTRAST:  60mL OMNIPAQUE IOHEXOL 350 MG/ML SOLN COMPARISON:  CT angiogram 06/25/2023. FINDINGS: Lower chest: There is some linear opacity lung bases likely scar or atelectasis. No pleural effusion. Mild ground-glass as well. Coronary artery calcifications are seen. Patulous esophagus with air and fluid. Hepatobiliary: Dilated gallbladder with some stones. No wall thickening. Preserved hepatic parenchyma. Patent portal vein. Pancreas: Unremarkable. No pancreatic ductal dilatation or surrounding inflammatory changes. Spleen: Normal in size without focal abnormality. Adrenals/Urinary Tract: Slight nodular thickening of the adrenal glands, unchanged from previous. No enhancing renal mass or collecting system dilatation. Underdistended urinary bladder. Bladder wall slightly thickened. Stomach/Bowel: Surgical changes from gastric bypass with a gastrojejunostomy. The excluded portion of the stomach is with some mild luminal fluid. Small bowel is nondilated on this non oral contrast exam. Large bowel has a normal caliber with scattered colonic stool. Redundant course to the colon diffusely particularly the sigmoid colon and transverse colon. Normal caliber appendix. Vascular/Lymphatic: Aortic atherosclerosis. No enlarged abdominal or pelvic lymph nodes. Reproductive: Prostate is unremarkable. Other: No free air or free fluid.  Mild anasarca. Musculoskeletal: Mild degenerative changes along the spine. IMPRESSION: Redundant course of the colon with diffuse stool. Normal appendix. No obstruction. Distended gallbladder with dependent stones. If there is further concern of acute gallbladder pathology ultrasound may be useful  as the next step in the workup. Patulous esophagus.  Gastric bypass with a gastrojejunostomy. Electronically Signed   By: Karen Kays M.D.   On: 01/07/2024 14:25   DG Chest 2  View Result Date: 01/07/2024 CLINICAL DATA:  Chest pain EXAM: CHEST - 2 VIEW COMPARISON:  03/28/2022 FINDINGS: Mild left hemidiaphragm elevation. Midline trachea. Normal heart size. Atherosclerosis in the transverse aorta. No pleural effusion or pneumothorax. Clear lungs. IMPRESSION: Aortic Atherosclerosis (ICD10-I70.0). No active cardiopulmonary disease. Electronically Signed   By: Jeronimo Greaves M.D.   On: 01/07/2024 12:43   (Echo, Carotid, EGD, Colonoscopy, ERCP)    Subjective: Seen in the morning rounds.  Had slight discomfort last night but no other symptoms.  Tolerated diet after procedure.   Discharge Exam: Vitals:   01/09/24 1030 01/09/24 1614  BP: 129/81 120/63  Pulse: (!) 49 73  Resp: 14 16  Temp: 97.9 F (36.6 C) 98.1 F (36.7 C)  SpO2: 97% 95%   Vitals:   01/09/24 1010 01/09/24 1015 01/09/24 1030 01/09/24 1614  BP: 107/74 (!) 101/57 129/81 120/63  Pulse: (!) 54 (!) 55 (!) 49 73  Resp: 16 20 14 16   Temp:   97.9 F (36.6 C) 98.1 F (36.7 C)  TempSrc:    Oral  SpO2: 97% 97% 97% 95%  Weight:      Height:        General: Pt is alert, awake, not in acute distress Cardiovascular: RRR, S1/S2 +, no rubs, no gallops Respiratory: CTA bilaterally, no wheezing, no rhonchi Abdominal: Soft, NT, ND, bowel sounds + Extremities: no edema, no cyanosis    The results of significant diagnostics from this hospitalization (including imaging, microbiology, ancillary and laboratory) are listed below for reference.     Microbiology: Recent Results (from the past 240 hours)  Resp panel by RT-PCR (RSV, Flu A&B, Covid) Anterior Nasal Swab     Status: None   Collection Time: 01/07/24  9:53 PM   Specimen: Anterior Nasal Swab  Result Value Ref Range Status   SARS Coronavirus 2 by RT PCR NEGATIVE NEGATIVE Final   Influenza A by PCR NEGATIVE NEGATIVE Final   Influenza B by PCR NEGATIVE NEGATIVE Final    Comment: (NOTE) The Xpert Xpress SARS-CoV-2/FLU/RSV plus assay is intended as an  aid in the diagnosis of influenza from Nasopharyngeal swab specimens and should not be used as a sole basis for treatment. Nasal washings and aspirates are unacceptable for Xpert Xpress SARS-CoV-2/FLU/RSV testing.  Fact Sheet for Patients: BloggerCourse.com  Fact Sheet for Healthcare Providers: SeriousBroker.it  This test is not yet approved or cleared by the [place] FDA and has been authorized for detection and/or diagnosis of SARS-CoV-2 by FDA under an Emergency Use Authorization (EUA). This EUA will remain in effect (meaning this test can be used) for the duration of the COVID-19 declaration under Section 564(b)(1) of the Act, 21 U.S.C. section 360bbb-3(b)(1), unless the authorization is terminated or revoked.     Resp Syncytial Virus by PCR NEGATIVE NEGATIVE Final    Comment: (NOTE) Fact Sheet for Patients: BloggerCourse.com  Fact Sheet for Healthcare Providers: SeriousBroker.it  This test is not yet approved or cleared by the [place] FDA and has been authorized for detection and/or diagnosis of SARS-CoV-2 by FDA under an Emergency Use Authorization (EUA). This EUA will remain in effect (meaning this test can be used) for the duration of the COVID-19 declaration under Section 564(b)(1) of the Act, 21 U.S.C. section 360bbb-3(b)(1), unless the authorization is terminated  or revoked.  Performed at [facility], [address], [place], [place] [address]      Labs: BNP (last 3 results) No results for input(s): "BNP" in the last 8760 hours. Basic Metabolic Panel: Recent Labs  Lab 01/07/24 1003 01/08/24 0747  NA 139 136  K 4.7 4.0  CL 104 103  CO2 23 24  GLUCOSE 152* 103*  BUN 26* 19  CREATININE 1.59* 1.25*  CALCIUM 9.3 9.1  MG  --  1.9  PHOS  --  3.3   Liver Function Tests: Recent Labs  Lab 01/07/24 1003 01/08/24 0747  AST 26 19  ALT 38  31  ALKPHOS 81 73  BILITOT 0.4 0.9  PROT 7.1 6.2*  ALBUMIN 4.1 3.6   Recent Labs  Lab 01/07/24 1003  LIPASE 39   No results for input(s): "AMMONIA" in the last 168 hours. CBC: Recent Labs  Lab 01/07/24 1003 01/08/24 0748  WBC 6.0 6.0  HGB 13.0 12.6*  HCT 43.2 40.4  MCV 74.0* 71.6*  PLT 185 162   Cardiac Enzymes: No results for input(s): "CKTOTAL", "CKMB", "CKMBINDEX", "TROPONINI" in the last 168 hours. BNP: Invalid input(s): "POCBNP" CBG: Recent Labs  Lab 01/08/24 2319 01/09/24 0420 01/09/24 0822 01/09/24 1010 01/09/24 1126  GLUCAP 227* 112* 114* 136* 116*   D-Dimer No results for input(s): "DDIMER" in the last 72 hours. Hgb A1c No results for input(s): "HGBA1C" in the last 72 hours. Lipid Profile No results for input(s): "CHOL", "HDL", "LDLCALC", "TRIG", "CHOLHDL", "LDLDIRECT" in the last 72 hours. Thyroid function studies No results for input(s): "TSH", "T4TOTAL", "T3FREE", "THYROIDAB" in the last 72 hours.  Invalid input(s): "FREET3" Anemia work up No results for input(s): "VITAMINB12", "FOLATE", "FERRITIN", "TIBC", "IRON", "RETICCTPCT" in the last 72 hours. Urinalysis    Component Value Date/Time   COLORURINE YELLOW 06/25/2023 2113   APPEARANCEUR CLEAR 06/25/2023 2113   LABSPEC 1.016 06/25/2023 2113   PHURINE 6.0 06/25/2023 2113   GLUCOSEU NEGATIVE 06/25/2023 2113   GLUCOSEU NEGATIVE 06/22/2023 0914   HGBUR NEGATIVE 06/25/2023 2113   BILIRUBINUR NEGATIVE 06/25/2023 2113   BILIRUBINUR negative 09/11/2013 1815   KETONESUR 5 (A) 06/25/2023 2113   PROTEINUR NEGATIVE 06/25/2023 2113   UROBILINOGEN 0.2 06/22/2023 0914   NITRITE NEGATIVE 06/25/2023 2113   LEUKOCYTESUR NEGATIVE 06/25/2023 2113   Sepsis Labs Recent Labs  Lab 01/07/24 1003 01/08/24 0748  WBC 6.0 6.0   Microbiology Recent Results (from the past 240 hours)  Resp panel by RT-PCR (RSV, Flu A&B, Covid) Anterior Nasal Swab     Status: None   Collection Time: 01/07/24  9:53 PM    Specimen: Anterior Nasal Swab  Result Value Ref Range Status   SARS Coronavirus 2 by RT PCR NEGATIVE NEGATIVE Final   Influenza A by PCR NEGATIVE NEGATIVE Final   Influenza B by PCR NEGATIVE NEGATIVE Final    Comment: (NOTE) The Xpert Xpress SARS-CoV-2/FLU/RSV plus assay is intended as an aid in the diagnosis of influenza from Nasopharyngeal swab specimens and should not be used as a sole basis for treatment. Nasal washings and aspirates are unacceptable for Xpert Xpress SARS-CoV-2/FLU/RSV testing.  Fact Sheet for Patients: BloggerCourse.com  Fact Sheet for Healthcare Providers: SeriousBroker.it  This test is not yet approved or cleared by the [place] FDA and has been authorized for detection and/or diagnosis of SARS-CoV-2 by FDA under an Emergency Use Authorization (EUA). This EUA will remain in effect (meaning this test can be used) for the duration of the COVID-19  declaration under Section 564(b)(1) of the Act, 21 U.S.C. section 360bbb-3(b)(1), unless the authorization is terminated or revoked.     Resp Syncytial Virus by PCR NEGATIVE NEGATIVE Final    Comment: (NOTE) Fact Sheet for Patients: BloggerCourse.com  Fact Sheet for Healthcare Providers: SeriousBroker.it  This test is not yet approved or cleared by the [place] FDA and has been authorized for detection and/or diagnosis of SARS-CoV-2 by FDA under an Emergency Use Authorization (EUA). This EUA will remain in effect (meaning this test can be used) for the duration of the COVID-19 declaration under Section 564(b)(1) of the Act, 21 U.S.C. section 360bbb-3(b)(1), unless the authorization is terminated or revoked.  Performed at [facility], [address] [place], [place] [address]      Time coordinating discharge: 32 minutes  SIGNED:   Dorcas Carrow, MD  [facility] 01/09/2024, 4:50  PM

## 2024-01-09 NOTE — Progress Notes (Signed)
Pt arrived back to 6 north room 29 alert and oriented x4. Pain level 4/10. Family at beside. I let patient know that they will be coming to get him for HIDA scan at 11:30a so he will need to remain NPO for right now. Pt verbalized understanding.

## 2024-01-09 NOTE — Transfer of Care (Signed)
Immediate Anesthesia Transfer of Care Note  Patient: [name]  Procedure(s) Performed: ESOPHAGOGASTRODUODENOSCOPY (EGD) WITH PROPOFOL BIOPSY  Patient Location: PACU  Anesthesia Type:MAC  Level of Consciousness: awake, alert , and oriented  Airway & Oxygen Therapy: Patient Spontanous Breathing  Post-op Assessment: Report given to RN and Post -op Vital signs reviewed and stable  Post vital signs: Reviewed and stable  Last Vitals:  Vitals Value Taken Time  BP 107/74 01/09/24 1007  Temp    Pulse 51 01/09/24 1012  Resp 20 01/09/24 1012  SpO2 97 % 01/09/24 1012  Vitals shown include unfiled device data.  Last Pain:  Vitals:   01/09/24 0811  TempSrc: Temporal  PainSc: 0-No pain         Complications: No notable events documented.

## 2024-01-09 NOTE — Anesthesia Preprocedure Evaluation (Addendum)
Anesthesia Evaluation  Patient identified by MRN, date of birth, ID band Patient awake    Reviewed: Allergy & Precautions, NPO status , Patient's Chart, lab work & pertinent test results  Airway Mallampati: II  TM Distance: >3 FB Neck ROM: Full    Dental  (+) Dental Advisory Given, Missing   Pulmonary sleep apnea , former smoker   Pulmonary exam normal breath sounds clear to auscultation       Cardiovascular hypertension, Pt. on medications + CAD and + DVT  Normal cardiovascular exam+ dysrhythmias  Rhythm:Regular Rate:Normal     Neuro/Psych  Headaches PSYCHIATRIC DISORDERS  Depression     Neuromuscular disease    GI/Hepatic Neg liver ROS,GERD  Medicated,,Nausea and vomiting with epigastric abdominal pain   Endo/Other  diabetes, Type 2, Oral Hypoglycemic Agents  Obesity   Renal/GU Renal InsufficiencyRenal disease     Musculoskeletal  (+) Arthritis ,    Abdominal   Peds  Hematology  (+) Blood dyscrasia, anemia   Anesthesia Other Findings Day of surgery medications reviewed with the patient.  Reproductive/Obstetrics                             Anesthesia Physical Anesthesia Plan  ASA: 3  Anesthesia Plan: MAC   Post-op Pain Management: Minimal or no pain anticipated   Induction: Intravenous  PONV Risk Score and Plan: 1 and TIVA  Airway Management Planned: Natural Airway and Simple Face Mask  Additional Equipment:   Intra-op Plan:   Post-operative Plan:   Informed Consent: I have reviewed the patients History and Physical, chart, labs and discussed the procedure including the risks, benefits and alternatives for the proposed anesthesia with the patient or authorized representative who has indicated his/her understanding and acceptance.     Dental advisory given  Plan Discussed with: CRNA  Anesthesia Plan Comments:        Anesthesia Quick Evaluation

## 2024-01-09 NOTE — Plan of Care (Signed)
  Problem: Metabolic: Goal: Ability to maintain appropriate glucose levels will improve Outcome: Progressing   Problem: Education: Goal: Knowledge of General Education information will improve Description: Including pain rating scale, medication(s)/side effects and non-pharmacologic comfort measures Outcome: Progressing   Problem: Elimination: Goal: Will not experience complications related to bowel motility Outcome: Progressing Goal: Will not experience complications related to urinary retention Outcome: Progressing   Problem: Pain Managment: Goal: General experience of comfort will improve and/or be controlled Outcome: Progressing   Problem: Safety: Goal: Ability to remain free from injury will improve Outcome: Progressing

## 2024-01-09 NOTE — Progress Notes (Signed)
PROGRESS NOTE    [name]  XBJ:[id number] DOB: 1956/11/28 DOA: 01/07/2024 PCP: Corwin Levins, MD    Brief Narrative:  68 year old gentleman with history of coronary artery disease, history of PE currently not on anticoagulation, prostate cancer status postradiation hypertension hyperlipidemia, type 2 diabetes, Roux-en-Y gastric bypass and GERD presented with acute onset of epigastric pain after eating breakfast and profuse vomiting.  Seen in the emergency room.  Hemodynamically stable.  Admitted due to significant symptoms.  Subjective:  Had mild episode of epigastric pain last night but none since then.  Going for endoscopy. By the time this note is created, patient had endoscopy with no major findings. Going for HIDA scan.  Assessment & Plan:   Epigastric pain/nausea and vomiting: Likely related to peptic ulcer disease, dyspepsia.  There is suspicion of gallbladder disease. Clinically improved.  On PPI. CT scan with gallbladder stone, ruled out by ultrasound.  No evidence of acute cholecystitis. LFTs are normal. HIDA scan to rule out gallbladder dysfunction. Symptoms management.  IV fluids. Home with outpatient follow-up if negative HIDA scan.  Chest pain: Atypical.  Epigastric pain likely related to 1.  Chronic medical issues including Prostate cancer, stable Hypertension, holding chlorthalidone due to dehydration.  Now on maintenance IV fluids. Type 2 diabetes: On glipizide and metformin at home.  Currently on sliding scale insulin.   DVT prophylaxis: SCDs Start: 01/07/24 2003   Code Status: Full code Family Communication: Wife at the bedside Disposition Plan: Status is: Inpatient.  Possible discharge later today or tomorrow after tolerating diet.   Consultants:  [facility]  Procedures:  None  Antimicrobials:  None     Objective: Vitals:   01/09/24 1008 01/09/24 1010 01/09/24 1015 01/09/24 1030  BP:  107/74 (!) 101/57 129/81  Pulse:  (!) 54 (!) 55  (!) 49  Resp:  16 20 14   Temp: 97.8 F (36.6 C)   97.9 F (36.6 C)  TempSrc:      SpO2:  97% 97% 97%  Weight:      Height:        Intake/Output Summary (Last 24 hours) at 01/09/2024 1420 Last data filed at 01/09/2024 1004 Gross per 24 hour  Intake 300 ml  Output 800 ml  Net -500 ml   Filed Weights   01/07/24 2134  Weight: 116.9 kg    Examination:  General exam: Appears calm and comfortable  Respiratory system: Clear to auscultation. Respiratory effort normal. Cardiovascular system: S1 & S2 heard, RRR. No JVD, murmurs, rubs, gallops or clicks. No pedal edema. Gastrointestinal system: Abdomen is nondistended, soft and nontender. No organomegaly or masses felt. Normal bowel sounds heard. Central nervous system: Alert and oriented. No focal neurological deficits.    Data Reviewed: I have personally reviewed following labs and imaging studies  CBC: Recent Labs  Lab 01/07/24 1003 01/08/24 0748  WBC 6.0 6.0  HGB 13.0 12.6*  HCT 43.2 40.4  MCV 74.0* 71.6*  PLT 185 162   Basic Metabolic Panel: Recent Labs  Lab 01/07/24 1003 01/08/24 0747  NA 139 136  K 4.7 4.0  CL 104 103  CO2 23 24  GLUCOSE 152* 103*  BUN 26* 19  CREATININE 1.59* 1.25*  CALCIUM 9.3 9.1  MG  --  1.9  PHOS  --  3.3   GFR: Estimated Creatinine Clearance: 74.7 mL/min (A) (by C-G formula based on SCr of 1.25 mg/dL (H)). Liver Function Tests: Recent Labs  Lab 01/07/24 1003 01/08/24 0747  AST 26 19  ALT 38 31  ALKPHOS 81 73  BILITOT 0.4 0.9  PROT 7.1 6.2*  ALBUMIN 4.1 3.6   Recent Labs  Lab 01/07/24 1003  LIPASE 39   No results for input(s): "AMMONIA" in the last 168 hours. Coagulation Profile: No results for input(s): "INR", "PROTIME" in the last 168 hours. Cardiac Enzymes: No results for input(s): "CKTOTAL", "CKMB", "CKMBINDEX", "TROPONINI" in the last 168 hours. BNP (last 3 results) No results for input(s): "PROBNP" in the last 8760 hours. HbA1C: No results for input(s):  "HGBA1C" in the last 72 hours. CBG: Recent Labs  Lab 01/08/24 2319 01/09/24 0420 01/09/24 0822 01/09/24 1010 01/09/24 1126  GLUCAP 227* 112* 114* 136* 116*   Lipid Profile: No results for input(s): "CHOL", "HDL", "LDLCALC", "TRIG", "CHOLHDL", "LDLDIRECT" in the last 72 hours. Thyroid Function Tests: No results for input(s): "TSH", "T4TOTAL", "FREET4", "T3FREE", "THYROIDAB" in the last 72 hours. Anemia Panel: No results for input(s): "VITAMINB12", "FOLATE", "FERRITIN", "TIBC", "IRON", "RETICCTPCT" in the last 72 hours. Sepsis Labs: No results for input(s): "PROCALCITON", "LATICACIDVEN" in the last 168 hours.  Recent Results (from the past 240 hours)  Resp panel by RT-PCR (RSV, Flu A&B, Covid) Anterior Nasal Swab     Status: None   Collection Time: 01/07/24  9:53 PM   Specimen: Anterior Nasal Swab  Result Value Ref Range Status   SARS Coronavirus 2 by RT PCR NEGATIVE NEGATIVE Final   Influenza A by PCR NEGATIVE NEGATIVE Final   Influenza B by PCR NEGATIVE NEGATIVE Final    Comment: (NOTE) The Xpert Xpress SARS-CoV-2/FLU/RSV plus assay is intended as an aid in the diagnosis of influenza from Nasopharyngeal swab specimens and should not be used as a sole basis for treatment. Nasal washings and aspirates are unacceptable for Xpert Xpress SARS-CoV-2/FLU/RSV testing.  Fact Sheet for Patients: BloggerCourse.com  Fact Sheet for Healthcare Providers: SeriousBroker.it  This test is not yet approved or cleared by the [place] FDA and has been authorized for detection and/or diagnosis of SARS-CoV-2 by FDA under an Emergency Use Authorization (EUA). This EUA will remain in effect (meaning this test can be used) for the duration of the COVID-19 declaration under Section 564(b)(1) of the Act, 21 U.S.C. section 360bbb-3(b)(1), unless the authorization is terminated or revoked.     Resp Syncytial Virus by PCR NEGATIVE NEGATIVE  Final    Comment: (NOTE) Fact Sheet for Patients: BloggerCourse.com  Fact Sheet for Healthcare Providers: SeriousBroker.it  This test is not yet approved or cleared by the [place] FDA and has been authorized for detection and/or diagnosis of SARS-CoV-2 by FDA under an Emergency Use Authorization (EUA). This EUA will remain in effect (meaning this test can be used) for the duration of the COVID-19 declaration under Section 564(b)(1) of the Act, 21 U.S.C. section 360bbb-3(b)(1), unless the authorization is terminated or revoked.  Performed at [facility], [address] [place], [place] [address]          Radiology Studies: US Abdomen Limited RUQ (LIVER/GB) Result Date: 01/07/2024 CLINICAL DATA:  Right upper quadrant abdominal pain. EXAM: ULTRASOUND ABDOMEN LIMITED RIGHT UPPER QUADRANT COMPARISON:  CT scan of same day. FINDINGS: Gallbladder: No gallstones or wall thickening visualized. No sonographic Murphy sign noted by sonographer. Common bile duct: Diameter: 3 mm which is within normal limits. Liver: No focal lesion identified. Within normal limits in parenchymal echogenicity. Portal vein is patent on color Doppler imaging with normal direction of blood flow towards the liver. Other: None. IMPRESSION: No definite abnormality  seen in the right upper quadrant of the abdomen. Electronically Signed   By: Lupita Raider M.D.   On: 01/07/2024 15:41        Scheduled Meds:  atorvastatin  80 mg Oral Daily   cyanocobalamin  1,000 mcg Oral Daily   folic acid  1 mg Oral Daily   insulin aspart  0-6 Units Subcutaneous TID WC   pantoprazole  40 mg Oral BID   Continuous Infusions:   LOS: 1 day    Time spent: 40 minutes    Dorcas Carrow, MD [facility]

## 2024-01-09 NOTE — Progress Notes (Signed)
Pt transported off unit to nuclear medicine for HIDA scan. BS checked before leaving unit. No coverage needed. BS 116

## 2024-01-09 NOTE — Op Note (Signed)
[facility] Patient Name: [name] Procedure Date : 01/09/2024 MRN: [id number] Attending MD: Maren Beach , MD, [id number] Date of Birth: 04/19/1956 CSN: [id number] Age: 68 Admit Type: Inpatient Procedure:                Upper GI endoscopy Indications:              Epigastric abdominal pain, Nausea with vomiting Providers:                Maren Beach, MD, Fransisca Connors, Rozetta Nunnery, Technician Referring MD:              Medicines:                Monitored Anesthesia Care Complications:            No immediate complications. Estimated blood loss:                            Minimal. Estimated Blood Loss:     Estimated blood loss was minimal. Procedure:                Pre-Anesthesia Assessment:                           - Prior to the procedure, a History and Physical                            was performed, and patient medications and                            allergies were reviewed. The patient's tolerance of                            previous anesthesia was also reviewed. The risks                            and benefits of the procedure and the sedation                            options and risks were discussed with the patient.                            All questions were answered, and informed consent                            was obtained. Prior Anticoagulants: The patient has                            taken no anticoagulant or antiplatelet agents. ASA                            Grade Assessment: II - A patient with mild systemic  disease. After reviewing the risks and benefits,                            the patient was deemed in satisfactory condition to                            undergo the procedure.                           After obtaining informed consent, the endoscope was                            passed under direct vision. Throughout the                            procedure, the  patient's blood pressure, pulse, and                            oxygen saturations were monitored continuously. The                            GIF-H190 ([id number]) Olympus endoscope was introduced                            through the mouth, and advanced to the jejunum. The                            upper GI endoscopy was accomplished without                            difficulty. The patient tolerated the procedure                            well. Scope In: Scope Out: Findings:      The examined esophagus was normal.      Evidence of a Roux-en-Y gastrojejunostomy was found. The gastric pouch       appeared overall normal. There was scant ulceration at the suture line       in the stomach. The gastrojejunal anastomosis was characterized by       healthy appearing mucosa. This was traversed. The pouch-to-jejunum limb       was characterized by healthy appearing mucosa. The blind jejunal limb       also appeared normal.      Biopsies were taken from the stomach with a cold forceps for histology       and H. pylori. Impression:               - Normal esophagus.                           - Roux-en-Y gastrojejunostomy with gastrojejunal                            anastomosis characterized by healthy appearing  mucosa. There was scant ulceration at the suture                            line in the gastric pouch. This does not appear to                            be significant enough to account for the patient's                            symptoms and clinical picture. Both limbs of the                            jejunum appeared normal.                           - Biopsies were taken with a cold forceps for H.                            pylori. Recommendation:           - Return patient to hospital ward for ongoing care.                           - Await pathology results.                           - Follow-up results of HIDA scan                           - Given  mild ulceration seen in the stomach                            reasonable to continue PPI.                           - Results reviewed with patient. Procedure Code(s):        --- Professional ---                           [phone], Esophagogastroduodenoscopy, flexible,                            transoral; with biopsy, single or multiple Diagnosis Code(s):        --- Professional ---                           Z98.0, Intestinal bypass and anastomosis status                           R10.13, Epigastric pain                           R11.2, Nausea with vomiting, unspecified CPT copyright 2022 American Medical Association. All rights reserved. The codes documented in this report are preliminary and upon coder review may  be revised to meet current compliance requirements. Maren Beach, MD 01/09/2024  10:12:23 AM This report has been signed electronically. Number of Addenda: 0

## 2024-01-09 NOTE — Anesthesia Postprocedure Evaluation (Signed)
Anesthesia Post Note  Patient: TAMER BAUGHMAN  Procedure(s) Performed: ESOPHAGOGASTRODUODENOSCOPY (EGD) WITH PROPOFOL BIOPSY     Patient location during evaluation: Endoscopy Anesthesia Type: MAC Level of consciousness: oriented, awake and alert and awake Pain management: pain level controlled Vital Signs Assessment: post-procedure vital signs reviewed and stable Respiratory status: spontaneous breathing, nonlabored ventilation and respiratory function stable Cardiovascular status: blood pressure returned to baseline and stable Postop Assessment: no headache, no backache and no apparent nausea or vomiting Anesthetic complications: no   No notable events documented.  Last Vitals:  Vitals:   01/09/24 1015 01/09/24 1030  BP: (!) 101/57 129/81  Pulse: (!) 55 (!) 49  Resp: 20 14  Temp:  36.6 C  SpO2: 97% 97%    Last Pain:  Vitals:   01/09/24 1049  TempSrc:   PainSc: 4                  Collene Schlichter

## 2024-01-10 ENCOUNTER — Encounter: Payer: Self-pay | Admitting: Pediatrics

## 2024-01-10 ENCOUNTER — Telehealth: Payer: Self-pay | Admitting: *Deleted

## 2024-01-10 LAB — SURGICAL PATHOLOGY

## 2024-01-10 NOTE — Transitions of Care (Post Inpatient/ED Visit) (Signed)
 01/10/2024  Name: [name] MRN: [id number] DOB: 02-Jun-1956  Today's TOC FU Call Status: Today's TOC FU Call Status:: Successful TOC FU Call Completed TOC FU Call Complete Date: 01/10/24 Patient's Name and Date of Birth confirmed.  Transition Care Management Follow-up Telephone Call Date of Discharge: 01/09/24 Discharge Facility: [facility]) Type of Discharge: Inpatient Admission Primary Inpatient Discharge Diagnosis:: Intractable Nausea/ vomiting How have you been since you were released from the hospital?: Better (I am fine, I am out and about today.  Not having any problems at all now.  Thanks for giving me the stomach doctor's name and nnumber- it was not included on the papers they sent home with me) Any questions or concerns?: No  Items Reviewed: Did you receive and understand the discharge instructions provided?: Yes (thoroughly reviewed with patient who verbalizes good understanding of same) Medications obtained,verified, and reconciled?: Yes (Medications Reviewed) (Full medication reconciliation/ review completed; no concerns or discrepancies identified; confirmed patient obtained/ is taking all newly Rx'd medications as instructed; self-manages medications and denies questions/ concerns around medications today) Any new allergies since your discharge?: No Dietary orders reviewed?: Yes Type of Diet Ordered:: I am following a soft conservative diet Do you have support at home?: Yes People in Home: spouse Name of Support/Comfort Primary Source: Reports independent in self-care activities; supportive spouse assists as/ if needed/ indicated  Medications Reviewed Today: Medications Reviewed Today     Reviewed by Darrek Leasure M, RN (Registered Nurse) on 01/10/24 at 1442  Med List Status: <None>   Medication Order Taking? Sig Documenting Provider Last Dose Status Informant  Artificial Tear Ointment (ARTIFICIAL TEARS) ointment [id number] Yes Place 1 drop into both eyes  every 4 (four) hours as needed (dry eyes).  [provider] Taking Active Self, Pharmacy Records  atorvastatin  (LIPITOR) 80 MG tablet [id number] Yes TAKE 1 TABLET BY MOUTH EVERY DAY Norleen Lynwood ORN, MD Taking Active Self, Pharmacy Records  chlorthalidone  (HYGROTON ) 25 MG tablet [id number] Yes Take 1 tablet (25 mg total) by mouth daily. Norleen Lynwood ORN, MD Taking Active Self, Pharmacy Records  Cholecalciferol (VITAMIN D3) 125 MCG (5000 UT) CAPS [id number] Yes Take 5,000 Units by mouth daily.  [provider] Taking Active Self, Pharmacy Records  ferrous sulfate  325 (65 FE) MG EC tablet [id number] Yes TAKE 1 TABLET BY MOUTH EVERY DAY Lanny Callander, MD Taking Active Self, Pharmacy Records  folic acid  (FOLVITE ) 1 MG tablet [id number] Yes TAKE 1 TABLET BY MOUTH EVERY DAY Lanny Callander, MD Taking Active Self, Pharmacy Records  glipiZIDE  (GLUCOTROL  XL) 2.5 MG 24 hr tablet [id number] Yes TAKE 1 TABLET BY MOUTH EVERY DAY WITH BREAKFAST John, James W, MD Taking Active Self, Pharmacy Records  metFORMIN  (GLUCOPHAGE -XR) 500 MG 24 hr tablet [id number] Yes Take 4 tablets (2,000 mg total) by mouth daily with breakfast. Norleen Lynwood ORN, MD Taking Active Self, Pharmacy Records  Multiple Vitamin (MULTIVITAMIN WITH MINERALS) TABS tablet [id number] Yes Take 1 tablet by mouth daily at 12 noon. [provider] Taking Active Self, Pharmacy Records  nitroGLYCERIN  (NITROSTAT ) 0.4 MG SL tablet [id number] Yes Place 1 tablet (0.4 mg total) under the tongue every 5 (five) minutes as needed for chest pain. Maranda Leim DEL, MD Taking Active Self, Pharmacy Records  omeprazole  [facility]) 40 MG capsule [id number] Yes Take 1 capsule (40 mg total) by mouth 2 (two) times daily. Raenelle Coria, MD Taking Active   [facility] ULTRA TEST test strip [id number] Yes USE AS INSTRUCTED Norleen Lynwood ORN, MD Taking Active Self,  Pharmacy Records  vitamin B-12 1000 MCG tablet [id number] Yes Take 1 tablet (1,000 mcg total) by mouth daily. Cherlyn Labella, MD  Taking Active Self, Pharmacy Records           Home Care and Equipment/Supplies: Were Home Health Services Ordered?: No Any new equipment or medical supplies ordered?: No  Functional Questionnaire: Do you need assistance with bathing/showering or dressing?: No Do you need assistance with meal preparation?: No Do you need assistance with eating?: No Do you have difficulty maintaining continence: No Do you need assistance with getting out of bed/getting out of a chair/moving?: No Do you have difficulty managing or taking your medications?: No  Follow up appointments reviewed: PCP Follow-up appointment confirmed?: Yes Date of PCP follow-up appointment?: 01/19/24 (care coordination outreach in real-time with scheduling care guide to successfully schedule hospital follow up PCP appointment 01/19/24) Follow-up Provider: PCP Dr. Norleen Driscilla Salvage Follow-up appointment confirmed?: No Reason Specialist Follow-Up Not Confirmed: Patient has Specialist Provider Number and will Call for Appointment (provided patient with contact information for GI provider- it was not included on AVS) Do you need transportation to your follow-up appointment?: No Do you understand care options if your condition(s) worsen?: Yes-patient verbalized understanding  SDOH Interventions Today    Flowsheet Row Most Recent Value  SDOH Interventions   Food Insecurity Interventions Intervention Not Indicated  Housing Interventions Intervention Not Indicated  Transportation Interventions Intervention Not Indicated  [drives self]      Interventions Today    Flowsheet Row Most Recent Value  Chronic Disease   Chronic disease during today's visit Other  [Intactable N/ V]  General Interventions   General Interventions Discussed/Reviewed General Interventions Discussed, Doctor Visits, Durable Medical Equipment (DME)  Doctor Visits Discussed/Reviewed PCP, Doctor Visits Discussed, Specialist  Durable Medical  Equipment (DME) Other  [confirmed not currently requiring/ using assistive devices for ambulation]  PCP/Specialist Visits Compliance with follow-up visit  Education Interventions   Education Provided Provided Education  Provided Verbal Education On When to see the doctor, Nutrition  [reinforced general soft/ conservative diet]  Nutrition Interventions   Nutrition Discussed/Reviewed Nutrition Discussed  Pharmacy Interventions   Pharmacy Dicussed/Reviewed Pharmacy Topics Discussed  [Full medication review with updating medication list in EHR per patient report]       TOC Interventions Today    Flowsheet Row Most Recent Value  TOC Interventions   TOC Interventions Discussed/Reviewed TOC Interventions Discussed, Arranged PCP follow up less than 12 days/Care Guide scheduled  [Patient declines need for ongoing/ further care management outreach,  declines enrollment in 30-day TOC program,  provided my direct contact information should questions/ concerns/ needs arise post-TOC call]      Total time spent from review to signing of note/ including any care coordination interventions:  45 minutes  Bellarose Burtt Mckinney Latonga Ponder, RN, BSN, Media Planner  Transitions of Care  [facility]  [place] [phone]: direct office

## 2024-01-12 ENCOUNTER — Telehealth: Payer: Self-pay | Admitting: Internal Medicine

## 2024-01-12 NOTE — Telephone Encounter (Signed)
 He needs a next available 1-2 months is fine - f/u me after hospital admission for abdominal pain and EGd with small gastric pouch ulcer

## 2024-01-12 NOTE — Telephone Encounter (Signed)
 I left him a detailed message to call us  back and set up an appointment.

## 2024-01-16 NOTE — Telephone Encounter (Signed)
 I spoke with Mr Hirt and set him up for 03/07/2024 to see Dr Willy Harvest.

## 2024-01-19 ENCOUNTER — Ambulatory Visit: Payer: PPO | Admitting: Internal Medicine

## 2024-01-19 ENCOUNTER — Encounter: Payer: Self-pay | Admitting: Internal Medicine

## 2024-01-19 VITALS — BP 126/82 | HR 55 | Temp 98.0°F | Ht 74.0 in | Wt 259.0 lb

## 2024-01-19 DIAGNOSIS — Z8719 Personal history of other diseases of the digestive system: Secondary | ICD-10-CM

## 2024-01-19 DIAGNOSIS — Z09 Encounter for follow-up examination after completed treatment for conditions other than malignant neoplasm: Secondary | ICD-10-CM

## 2024-01-19 DIAGNOSIS — R112 Nausea with vomiting, unspecified: Secondary | ICD-10-CM

## 2024-01-19 DIAGNOSIS — N183 Chronic kidney disease, stage 3 unspecified: Secondary | ICD-10-CM

## 2024-01-19 LAB — POCT GLYCOSYLATED HEMOGLOBIN (HGB A1C): Hemoglobin A1C: 7.1 % — AB (ref 4.0–5.6)

## 2024-01-19 NOTE — Patient Instructions (Addendum)
Your A1c was done today  Please continue all other medications as before, including the new omeprazole 40 mg per day  Please have the pharmacy call with any other refills you may need.  Please continue your efforts at being more active, low cholesterol diet, and weight control.  Please keep your appointments with your specialists as you may have planned - GI Dr Leone Payor Apr 2  Please make an Appointment to return in Apr 21, or sooner if needed

## 2024-01-19 NOTE — Progress Notes (Signed)
Patient ID: [name], male   DOB: 01-27-1956, 68 y.o.   MRN: [id number]        Chief Complaint: follow up recent hospn 2/1 - 2/3 2025 with reflux and intractable n/v       HPI:  [name] is a 68 y.o. male here overall doing much better, Denies worsening reflux, abd pain, dysphagia, n/v, bowel change or blood.   Has f/u with Dr Leone Payor GI apr 4.   Pt denies chest pain, increased sob or doe, wheezing, orthopnea, PND, increased LE swelling, palpitations, dizziness or syncope.   Pt denies polydipsia, polyuria, or new focal neuro s/s.   Good compliance with PPI Bid.   Wt Readings from Last 3 Encounters:  01/19/24 259 lb (117.5 kg)  01/07/24 257 lb 11.5 oz (116.9 kg)  11/24/23 261 lb (118.4 kg)   BP Readings from Last 3 Encounters:  01/19/24 126/82  01/09/24 120/63  01/07/24 (!) 154/74         Past Medical History:  Diagnosis Date   Arthritis    BACK PAIN    CAD (coronary artery disease) 06/28/2017   CAD in native artery    a. moderate by cardiac CT 2016.   Cancer [facility])    Chronic bilateral low back pain with bilateral sciatica 01/12/2019   Degenerative arthritis of right knee 07/15/2016   Injected in 07/15/2016 Orthovisc injection started 10/07/2016 DepoMedrol 40 inj on 04/11/17, 25 cc aspirated Orthovisc injection in 05/30/2017     DEGENERATIVE JOINT DISEASE, KNEES, BILATERAL    Depression    Diabetes (HCC) 10/01/2014   Diabetes mellitus    type II   DIVERTICULOSIS, COLON    DVT, lower extremity (HCC)    right   GERD (gastroesophageal reflux disease)    History of kidney stones    Hyperlipidemia    Hypertension    HYPOGONADISM, MALE    HYPOTENSION, ORTHOSTATIC    Kidney stones    Long term (current) use of anticoagulants 08/17/2017   Long term current use of anticoagulant    Lumbosacral radiculopathy at S1 06/14/2012   Maxillary sinus cyst 12/11/2018   Obesity    OBESITY, MORBID 03/04/2008   Qualifier: Diagnosis of  By: Jonny Ruiz MD, Len Blalock    OSA (obstructive  sleep apnea) 09/11/2013   Pulmonary emboli (HCC)    Pulmonary embolism (HCC) 02/05/2011   RBBB 07/28/2017   Renal insufficiency    a. prior h/o, does not appear chronic.   Superficial phlebitis of arm 03/18/2016   Vitamin D deficiency 07/09/2020   Past Surgical History:  Procedure Laterality Date   ankle surgury  2001 bilateral   with bone spurs and torn tendon   BIOPSY  01/09/2024   Procedure: BIOPSY;  Surgeon: Ottie Glazier, MD;  Location: [facility] ENDOSCOPY;  Service: Gastroenterology;;   COLONOSCOPY  03/18/2010   EDG  2008   chronic Duodenitis   ESOPHAGOGASTRODUODENOSCOPY (EGD) WITH PROPOFOL N/A 01/09/2024   Procedure: ESOPHAGOGASTRODUODENOSCOPY (EGD) WITH PROPOFOL;  Surgeon: Ottie Glazier, MD;  Location: [facility] ENDOSCOPY;  Service: Gastroenterology;  Laterality: N/A;   FLEXIBLE SIGMOIDOSCOPY N/A 11/03/2021   Procedure: FLEXIBLE SIGMOIDOSCOPY;  Surgeon: Sherrilyn Rist, MD;  Location: [facility] ENDOSCOPY;  Service: Gastroenterology;  Laterality: N/A;   GASTRIC BYPASS     HEEL SPUR SURGERY Bilateral 2005   IR ANGIOGRAM PELVIS SELECTIVE OR SUPRASELECTIVE  11/02/2021   IR ANGIOGRAM PELVIS SELECTIVE OR SUPRASELECTIVE  11/04/2021   IR ANGIOGRAM SELECTIVE EACH ADDITIONAL VESSEL  11/02/2021  IR ANGIOGRAM SELECTIVE EACH ADDITIONAL VESSEL  11/02/2021   IR ANGIOGRAM SELECTIVE EACH ADDITIONAL VESSEL  11/02/2021   IR ANGIOGRAM SELECTIVE EACH ADDITIONAL VESSEL  11/04/2021   IR ANGIOGRAM SELECTIVE EACH ADDITIONAL VESSEL  11/04/2021   IR ANGIOGRAM SELECTIVE EACH ADDITIONAL VESSEL  11/04/2021   IR ANGIOGRAM SELECTIVE EACH ADDITIONAL VESSEL  11/04/2021   IR ANGIOGRAM VISCERAL SELECTIVE  11/02/2021   IR ANGIOGRAM VISCERAL SELECTIVE  11/04/2021   IR EMBO ART  VEN HEMORR LYMPH EXTRAV  INC GUIDE ROADMAPPING  11/02/2021   IR EMBO ART  VEN HEMORR LYMPH EXTRAV  INC GUIDE ROADMAPPING  11/04/2021   IR EMBO ART  VEN HEMORR LYMPH EXTRAV  INC GUIDE ROADMAPPING  11/04/2021   IR RADIOLOGIST EVAL & MGMT  06/01/2023    IR US GUIDE VASC ACCESS RIGHT  11/02/2021   IR US GUIDE VASC ACCESS RIGHT  11/04/2021   knee surgury  2000 & 2003    cartilage damage   LEFT HEART CATH AND CORONARY ANGIOGRAPHY N/A 02/16/2021   Procedure: LEFT HEART CATH AND CORONARY ANGIOGRAPHY;  Surgeon: Tonny Bollman, MD;  Location: [facility] INVASIVE CV LAB;  Service: Cardiovascular;  Laterality: N/A;   LUMBAR LAMINECTOMY/DECOMPRESSION MICRODISCECTOMY  09/28/2012   Procedure: LUMBAR LAMINECTOMY/DECOMPRESSION MICRODISCECTOMY 2 LEVELS;  Surgeon: Drucilla Schmidt, MD;  Location: [facility] ORS;  Service: Orthopedics;  Laterality: Left;  Decompressive laminectomy L4-L5. Microdiscectomy L5-S1   Skin removal surgery     extra abdominal skin removed from his weight loss   TONSILLECTOMY AND ADENOIDECTOMY  age 88 or 6   TOTAL KNEE ARTHROPLASTY  01/10/2012   Procedure: TOTAL KNEE ARTHROPLASTY;  Surgeon: Shelda Pal, MD;  Location: [facility] ORS;  Service: Orthopedics;  Laterality: Left;   TOTAL KNEE ARTHROPLASTY Right 11/18/2020   Procedure: TOTAL KNEE ARTHROPLASTY;  Surgeon: Durene Romans, MD;  Location: [facility] ORS;  Service: Orthopedics;  Laterality: Right;  70 mins   UPPER GASTROINTESTINAL ENDOSCOPY  03/18/2010    reports that he quit smoking about 45 years ago. His smoking use included cigarettes. He started smoking about 50 years ago. He has a 1.3 pack-year smoking history. He has never used smokeless tobacco. He reports current alcohol use of about 2.0 standard drinks of alcohol per week. He reports that he does not use drugs. family history includes Diabetes in his brother, sister, sister, and sister; Heart failure in his sister; Hypertension in his mother; Prostate cancer in his father. Allergies  Allergen Reactions   Other Other (See Comments)    NO BLOOD -JEHOVAH'S WITNESS-SIGNED REFUSAL BUT WOULD TAKE ALBUMIN   Adhesive [Tape] Other (See Comments)    Painful, Please use "paper" tape   Current Outpatient Medications on File Prior to Visit  Medication Sig  Dispense Refill   Artificial Tear Ointment (ARTIFICIAL TEARS) ointment Place 1 drop into both eyes every 4 (four) hours as needed (dry eyes).      atorvastatin (LIPITOR) 80 MG tablet TAKE 1 TABLET BY MOUTH EVERY DAY 90 tablet 3   chlorthalidone (HYGROTON) 25 MG tablet Take 1 tablet (25 mg total) by mouth daily. 90 tablet 3   Cholecalciferol (VITAMIN D3) 125 MCG (5000 UT) CAPS Take 5,000 Units by mouth daily.      ferrous sulfate 325 (65 FE) MG EC tablet TAKE 1 TABLET BY MOUTH EVERY DAY 90 tablet 1   folic acid (FOLVITE) 1 MG tablet TAKE 1 TABLET BY MOUTH EVERY DAY 90 tablet 1   glipiZIDE (GLUCOTROL XL) 2.5 MG 24 hr tablet TAKE 1 TABLET  BY MOUTH EVERY DAY WITH BREAKFAST 90 tablet 3   metFORMIN (GLUCOPHAGE-XR) 500 MG 24 hr tablet Take 4 tablets (2,000 mg total) by mouth daily with breakfast. 360 tablet 3   Multiple Vitamin (MULTIVITAMIN WITH MINERALS) TABS tablet Take 1 tablet by mouth daily at 12 noon.     nitroGLYCERIN (NITROSTAT) 0.4 MG SL tablet Place 1 tablet (0.4 mg total) under the tongue every 5 (five) minutes as needed for chest pain. 90 tablet 3   omeprazole (PRILOSEC) 40 MG capsule Take 1 capsule (40 mg total) by mouth 2 (two) times daily. 90 capsule 3   ONETOUCH ULTRA TEST test strip USE AS INSTRUCTED 100 strip 12   vitamin B-12 1000 MCG tablet Take 1 tablet (1,000 mcg total) by mouth daily. 30 tablet 1   No current facility-administered medications on file prior to visit.        ROS:  All others reviewed and negative.  Objective        PE:  BP 126/82 (BP Location: Left Arm, Patient Position: Sitting, Cuff Size: Normal)   Pulse (!) 55   Temp 98 F (36.7 C) (Oral)   Ht 6\' 2"  (1.88 m)   Wt 259 lb (117.5 kg)   SpO2 98%   BMI 33.25 kg/m                 Constitutional: Pt appears in NAD               HENT: Head: NCAT.                Right Ear: External ear normal.                 Left Ear: External ear normal.                Eyes: . Pupils are equal, round, and reactive to  light. Conjunctivae and EOM are normal               Nose: without d/c or deformity               Neck: Neck supple. Gross normal ROM               Cardiovascular: Normal rate and regular rhythm.                 Pulmonary/Chest: Effort normal and breath sounds without rales or wheezing.                Abd:  Soft, NT, ND, + BS, no organomegaly               Neurological: Pt is alert. At baseline orientation, motor grossly intact               Skin: Skin is warm. No rashes, no other new lesions, LE edema - none               Psychiatric: Pt behavior is normal without agitation   Micro: none  Cardiac tracings I have personally interpreted today:  none  Pertinent Radiological findings (summarize): none   Lab Results  Component Value Date   WBC 6.0 01/08/2024   HGB 12.6 (L) 01/08/2024   HCT 40.4 01/08/2024   PLT 162 01/08/2024   GLUCOSE 103 (H) 01/08/2024   CHOL 98 09/13/2023   TRIG 60.0 09/13/2023   HDL 42.10 09/13/2023   LDLDIRECT 116.0 01/15/2020   LDLCALC 43 09/13/2023   ALT 31 01/08/2024   AST 19  01/08/2024   NA 136 01/08/2024   K 4.0 01/08/2024   CL 103 01/08/2024   CREATININE 1.25 (H) 01/08/2024   BUN 19 01/08/2024   CO2 24 01/08/2024   TSH 2.62 06/22/2023   PSA 0.78 09/13/2023   INR 1.2 (H) 06/22/2023   HGBA1C 7.1 (A) 01/19/2024   MICROALBUR <0.7 06/22/2023   Assessment/Plan:  [name] is a 68 y.o. Black or African American [2] male with  has a past medical history of Arthritis, BACK PAIN, CAD (coronary artery disease) (06/28/2017), CAD in native artery, Cancer (HCC), Chronic bilateral low back pain with bilateral sciatica (01/12/2019), Degenerative arthritis of right knee (07/15/2016), DEGENERATIVE JOINT DISEASE, KNEES, BILATERAL, Depression, Diabetes (HCC) (10/01/2014), Diabetes mellitus, DIVERTICULOSIS, COLON, DVT, lower extremity (HCC), GERD (gastroesophageal reflux disease), History of kidney stones, Hyperlipidemia, Hypertension, HYPOGONADISM, MALE,  HYPOTENSION, ORTHOSTATIC, Kidney stones, Long term (current) use of anticoagulants (08/17/2017), Long term current use of anticoagulant, Lumbosacral radiculopathy at S1 (06/14/2012), Maxillary sinus cyst (12/11/2018), Obesity, OBESITY, MORBID (03/04/2008), OSA (obstructive sleep apnea) (09/11/2013), Pulmonary emboli (HCC), Pulmonary embolism (HCC) (02/05/2011), RBBB (07/28/2017), Renal insufficiency, Superficial phlebitis of arm (03/18/2016), and Vitamin D deficiency (07/09/2020).  Intractable nausea and vomiting Now resolved, cont PPI bid, f/u GI as planned  Followup: Return in about 2 months (around 03/26/2024).  Oliver Barre, MD 01/21/2024 10:34 PM [facility] [place] [facility]

## 2024-01-21 ENCOUNTER — Encounter: Payer: Self-pay | Admitting: Internal Medicine

## 2024-01-21 NOTE — Assessment & Plan Note (Signed)
Now resolved, cont PPI bid, f/u GI as planned

## 2024-01-21 NOTE — Addendum Note (Signed)
Addended by: Corwin Levins on: 01/21/2024 10:35 PM   Modules accepted: Level of Service

## 2024-02-03 ENCOUNTER — Other Ambulatory Visit: Payer: Self-pay | Admitting: Internal Medicine

## 2024-02-03 ENCOUNTER — Other Ambulatory Visit: Payer: Self-pay

## 2024-02-17 ENCOUNTER — Other Ambulatory Visit

## 2024-02-17 ENCOUNTER — Encounter: Payer: Self-pay | Admitting: Internal Medicine

## 2024-02-17 ENCOUNTER — Ambulatory Visit: Payer: Self-pay | Admitting: Internal Medicine

## 2024-02-17 ENCOUNTER — Ambulatory Visit (INDEPENDENT_AMBULATORY_CARE_PROVIDER_SITE_OTHER): Admitting: Internal Medicine

## 2024-02-17 VITALS — BP 120/72 | HR 65 | Temp 98.4°F | Ht 74.0 in | Wt 261.0 lb

## 2024-02-17 DIAGNOSIS — E1122 Type 2 diabetes mellitus with diabetic chronic kidney disease: Secondary | ICD-10-CM

## 2024-02-17 DIAGNOSIS — R300 Dysuria: Secondary | ICD-10-CM

## 2024-02-17 DIAGNOSIS — N183 Chronic kidney disease, stage 3 unspecified: Secondary | ICD-10-CM

## 2024-02-17 DIAGNOSIS — N1831 Chronic kidney disease, stage 3a: Secondary | ICD-10-CM | POA: Diagnosis not present

## 2024-02-17 DIAGNOSIS — Z7984 Long term (current) use of oral hypoglycemic drugs: Secondary | ICD-10-CM

## 2024-02-17 DIAGNOSIS — R310 Gross hematuria: Secondary | ICD-10-CM

## 2024-02-17 DIAGNOSIS — E559 Vitamin D deficiency, unspecified: Secondary | ICD-10-CM | POA: Diagnosis not present

## 2024-02-17 LAB — URINALYSIS, ROUTINE W REFLEX MICROSCOPIC
Bilirubin Urine: NEGATIVE
Ketones, ur: NEGATIVE
Leukocytes,Ua: NEGATIVE
Nitrite: NEGATIVE
Specific Gravity, Urine: 1.02 (ref 1.000–1.030)
Total Protein, Urine: NEGATIVE
Urine Glucose: NEGATIVE
Urobilinogen, UA: 0.2 (ref 0.0–1.0)
pH: 6 (ref 5.0–8.0)

## 2024-02-17 MED ORDER — CIPROFLOXACIN HCL 500 MG PO TABS: 500.0000 mg | ORAL_TABLET | Freq: Two times a day (BID) | ORAL | 0 refills | Status: AC

## 2024-02-17 NOTE — Patient Instructions (Signed)
 Please take all new medication as prescribed - the antibiotic  Please go to the LAB at the blood drawing area for the tests to be done - just the urine testing today

## 2024-02-17 NOTE — Assessment & Plan Note (Signed)
 Mild to mod, can't ro uti - for cipro empiric course, ua and culture, to f/u any worsening symptoms or concerns

## 2024-02-17 NOTE — Progress Notes (Signed)
 Patient ID: [name], male   DOB: 09/28/1956, 68 y.o.   MRN: [id number]        Chief Complaint: follow up dysuria with small gross hematuria       HPI:  [name] is a 68 y.o. male here with c/o 3 days onset dysuria and frequency with small gross hematuria with feeling ill, feverish, but Denies urinary symptoms such as urgency, flank pain, hematuria or n/v,  Pt denies chest pain, increased sob or doe, wheezing, orthopnea, PND, increased LE swelling, palpitations, dizziness or syncope.   Pt denies polydipsia, polyuria, or new focal neuro s/s.    Pt denies fever, wt loss, night sweats, loss of appetite, or other constitutional symptoms       Wt Readings from Last 3 Encounters:  02/17/24 261 lb (118.4 kg)  01/19/24 259 lb (117.5 kg)  01/07/24 257 lb 11.5 oz (116.9 kg)   BP Readings from Last 3 Encounters:  02/17/24 120/72  01/19/24 126/82  01/09/24 120/63         Past Medical History:  Diagnosis Date   Arthritis    BACK PAIN    CAD (coronary artery disease) 06/28/2017   CAD in native artery    a. moderate by cardiac CT 2016.   Cancer [facility])    Chronic bilateral low back pain with bilateral sciatica 01/12/2019   Degenerative arthritis of right knee 07/15/2016   Injected in 07/15/2016 Orthovisc injection started 10/07/2016 DepoMedrol 40 inj on 04/11/17, 25 cc aspirated Orthovisc injection in 05/30/2017     DEGENERATIVE JOINT DISEASE, KNEES, BILATERAL    Depression    Diabetes (HCC) 10/01/2014   Diabetes mellitus    type II   DIVERTICULOSIS, COLON    DVT, lower extremity (HCC)    right   GERD (gastroesophageal reflux disease)    History of kidney stones    Hyperlipidemia    Hypertension    HYPOGONADISM, MALE    HYPOTENSION, ORTHOSTATIC    Kidney stones    Long term (current) use of anticoagulants 08/17/2017   Long term current use of anticoagulant    Lumbosacral radiculopathy at S1 06/14/2012   Maxillary sinus cyst 12/11/2018   Obesity    OBESITY, MORBID  03/04/2008   Qualifier: Diagnosis of  By: Jonny Ruiz MD, Len Blalock    OSA (obstructive sleep apnea) 09/11/2013   Pulmonary emboli (HCC)    Pulmonary embolism (HCC) 02/05/2011   RBBB 07/28/2017   Renal insufficiency    a. prior h/o, does not appear chronic.   Superficial phlebitis of arm 03/18/2016   Vitamin D deficiency 07/09/2020   Past Surgical History:  Procedure Laterality Date   ankle surgury  2001 bilateral   with bone spurs and torn tendon   BIOPSY  01/09/2024   Procedure: BIOPSY;  Surgeon: Ottie Glazier, MD;  Location: [facility] ENDOSCOPY;  Service: Gastroenterology;;   COLONOSCOPY  03/18/2010   EDG  2008   chronic Duodenitis   ESOPHAGOGASTRODUODENOSCOPY (EGD) WITH PROPOFOL N/A 01/09/2024   Procedure: ESOPHAGOGASTRODUODENOSCOPY (EGD) WITH PROPOFOL;  Surgeon: Ottie Glazier, MD;  Location: [facility] ENDOSCOPY;  Service: Gastroenterology;  Laterality: N/A;   FLEXIBLE SIGMOIDOSCOPY N/A 11/03/2021   Procedure: FLEXIBLE SIGMOIDOSCOPY;  Surgeon: Sherrilyn Rist, MD;  Location: [facility] ENDOSCOPY;  Service: Gastroenterology;  Laterality: N/A;   GASTRIC BYPASS     HEEL SPUR SURGERY Bilateral 2005   IR ANGIOGRAM PELVIS SELECTIVE OR SUPRASELECTIVE  11/02/2021   IR ANGIOGRAM PELVIS SELECTIVE OR SUPRASELECTIVE  11/04/2021  IR ANGIOGRAM SELECTIVE EACH ADDITIONAL VESSEL  11/02/2021   IR ANGIOGRAM SELECTIVE EACH ADDITIONAL VESSEL  11/02/2021   IR ANGIOGRAM SELECTIVE EACH ADDITIONAL VESSEL  11/02/2021   IR ANGIOGRAM SELECTIVE EACH ADDITIONAL VESSEL  11/04/2021   IR ANGIOGRAM SELECTIVE EACH ADDITIONAL VESSEL  11/04/2021   IR ANGIOGRAM SELECTIVE EACH ADDITIONAL VESSEL  11/04/2021   IR ANGIOGRAM SELECTIVE EACH ADDITIONAL VESSEL  11/04/2021   IR ANGIOGRAM VISCERAL SELECTIVE  11/02/2021   IR ANGIOGRAM VISCERAL SELECTIVE  11/04/2021   IR EMBO ART  VEN HEMORR LYMPH EXTRAV  INC GUIDE ROADMAPPING  11/02/2021   IR EMBO ART  VEN HEMORR LYMPH EXTRAV  INC GUIDE ROADMAPPING  11/04/2021   IR EMBO ART  VEN HEMORR LYMPH  EXTRAV  INC GUIDE ROADMAPPING  11/04/2021   IR RADIOLOGIST EVAL & MGMT  06/01/2023   IR US GUIDE VASC ACCESS RIGHT  11/02/2021   IR US GUIDE VASC ACCESS RIGHT  11/04/2021   knee surgury  2000 & 2003    cartilage damage   LEFT HEART CATH AND CORONARY ANGIOGRAPHY N/A 02/16/2021   Procedure: LEFT HEART CATH AND CORONARY ANGIOGRAPHY;  Surgeon: Tonny Bollman, MD;  Location: [facility] INVASIVE CV LAB;  Service: Cardiovascular;  Laterality: N/A;   LUMBAR LAMINECTOMY/DECOMPRESSION MICRODISCECTOMY  09/28/2012   Procedure: LUMBAR LAMINECTOMY/DECOMPRESSION MICRODISCECTOMY 2 LEVELS;  Surgeon: Drucilla Schmidt, MD;  Location: [facility] ORS;  Service: Orthopedics;  Laterality: Left;  Decompressive laminectomy L4-L5. Microdiscectomy L5-S1   Skin removal surgery     extra abdominal skin removed from his weight loss   TONSILLECTOMY AND ADENOIDECTOMY  age 52 or 6   TOTAL KNEE ARTHROPLASTY  01/10/2012   Procedure: TOTAL KNEE ARTHROPLASTY;  Surgeon: Shelda Pal, MD;  Location: [facility] ORS;  Service: Orthopedics;  Laterality: Left;   TOTAL KNEE ARTHROPLASTY Right 11/18/2020   Procedure: TOTAL KNEE ARTHROPLASTY;  Surgeon: Durene Romans, MD;  Location: [facility] ORS;  Service: Orthopedics;  Laterality: Right;  70 mins   UPPER GASTROINTESTINAL ENDOSCOPY  03/18/2010    reports that he quit smoking about 45 years ago. His smoking use included cigarettes. He started smoking about 50 years ago. He has a 1.3 pack-year smoking history. He has never used smokeless tobacco. He reports current alcohol use of about 2.0 standard drinks of alcohol per week. He reports that he does not use drugs. family history includes Diabetes in his brother, sister, sister, and sister; Heart failure in his sister; Hypertension in his mother; Prostate cancer in his father. Allergies  Allergen Reactions   Other Other (See Comments)    NO BLOOD -JEHOVAH'S WITNESS-SIGNED REFUSAL BUT WOULD TAKE ALBUMIN   Adhesive [Tape] Other (See Comments)    Painful, Please use  "paper" tape   Current Outpatient Medications on File Prior to Visit  Medication Sig Dispense Refill   Artificial Tear Ointment (ARTIFICIAL TEARS) ointment Place 1 drop into both eyes every 4 (four) hours as needed (dry eyes).      atorvastatin (LIPITOR) 80 MG tablet TAKE 1 TABLET BY MOUTH EVERY DAY 90 tablet 3   chlorthalidone (HYGROTON) 25 MG tablet Take 1 tablet (25 mg total) by mouth daily. 90 tablet 3   Cholecalciferol (VITAMIN D3) 125 MCG (5000 UT) CAPS Take 5,000 Units by mouth daily.      ferrous sulfate 325 (65 FE) MG EC tablet TAKE 1 TABLET BY MOUTH EVERY DAY 90 tablet 1   folic acid (FOLVITE) 1 MG tablet TAKE 1 TABLET BY MOUTH EVERY DAY 90 tablet 1   glipiZIDE (  GLUCOTROL XL) 2.5 MG 24 hr tablet TAKE 1 TABLET BY MOUTH EVERY DAY WITH BREAKFAST 90 tablet 3   metFORMIN (GLUCOPHAGE-XR) 500 MG 24 hr tablet Take 4 tablets (2,000 mg total) by mouth daily with breakfast. 360 tablet 3   Multiple Vitamin (MULTIVITAMIN WITH MINERALS) TABS tablet Take 1 tablet by mouth daily at 12 noon.     nitroGLYCERIN (NITROSTAT) 0.4 MG SL tablet Place 1 tablet (0.4 mg total) under the tongue every 5 (five) minutes as needed for chest pain. 90 tablet 3   omeprazole (PRILOSEC) 40 MG capsule Take 1 capsule (40 mg total) by mouth 2 (two) times daily. 90 capsule 3   ONETOUCH ULTRA TEST test strip USE AS INSTRUCTED 100 strip 12   vitamin B-12 1000 MCG tablet Take 1 tablet (1,000 mcg total) by mouth daily. 30 tablet 1   No current facility-administered medications on file prior to visit.        ROS:  All others reviewed and negative.  Objective        PE:  BP 120/72 (BP Location: Right Arm, Patient Position: Sitting, Cuff Size: Normal)   Pulse 65   Temp 98.4 F (36.9 C) (Oral)   Ht 6\' 2"  (1.88 m)   Wt 261 lb (118.4 kg)   SpO2 98%   BMI 33.51 kg/m                 Constitutional: Pt appears in NAD               HENT: Head: NCAT.                Right Ear: External ear normal.                 Left Ear:  External ear normal.                Eyes: . Pupils are equal, round, and reactive to light. Conjunctivae and EOM are normal               Nose: without d/c or deformity               Neck: Neck supple. Gross normal ROM               Cardiovascular: Normal rate and regular rhythm.                 Pulmonary/Chest: Effort normal and breath sounds without rales or wheezing.                Abd:  Soft, NT, ND, + BS, no organomegaly               Neurological: Pt is alert. At baseline orientation, motor grossly intact               Skin: Skin is warm. No rashes, no other new lesions, LE edema - none               Psychiatric: Pt behavior is normal without agitation   Micro: none  Cardiac tracings I have personally interpreted today:  none  Pertinent Radiological findings (summarize): none   Lab Results  Component Value Date   WBC 6.0 01/08/2024   HGB 12.6 (L) 01/08/2024   HCT 40.4 01/08/2024   PLT 162 01/08/2024   GLUCOSE 103 (H) 01/08/2024   CHOL 98 09/13/2023   TRIG 60.0 09/13/2023   HDL 42.10 09/13/2023   LDLDIRECT 116.0 01/15/2020   LDLCALC 43 09/13/2023  ALT 31 01/08/2024   AST 19 01/08/2024   NA 136 01/08/2024   K 4.0 01/08/2024   CL 103 01/08/2024   CREATININE 1.25 (H) 01/08/2024   BUN 19 01/08/2024   CO2 24 01/08/2024   TSH 2.62 06/22/2023   PSA 0.78 09/13/2023   INR 1.2 (H) 06/22/2023   HGBA1C 7.1 (A) 01/19/2024   MICROALBUR <0.7 06/22/2023   Assessment/Plan:  NIKODEM LEADBETTER is a 68 y.o. Black or African American [2] male with  has a past medical history of Arthritis, BACK PAIN, CAD (coronary artery disease) (06/28/2017), CAD in native artery, Cancer (HCC), Chronic bilateral low back pain with bilateral sciatica (01/12/2019), Degenerative arthritis of right knee (07/15/2016), DEGENERATIVE JOINT DISEASE, KNEES, BILATERAL, Depression, Diabetes (HCC) (10/01/2014), Diabetes mellitus, DIVERTICULOSIS, COLON, DVT, lower extremity (HCC), GERD (gastroesophageal reflux disease),  History of kidney stones, Hyperlipidemia, Hypertension, HYPOGONADISM, MALE, HYPOTENSION, ORTHOSTATIC, Kidney stones, Long term (current) use of anticoagulants (08/17/2017), Long term current use of anticoagulant, Lumbosacral radiculopathy at S1 (06/14/2012), Maxillary sinus cyst (12/11/2018), Obesity, OBESITY, MORBID (03/04/2008), OSA (obstructive sleep apnea) (09/11/2013), Pulmonary emboli (HCC), Pulmonary embolism (HCC) (02/05/2011), RBBB (07/28/2017), Renal insufficiency, Superficial phlebitis of arm (03/18/2016), and Vitamin D deficiency (07/09/2020).  Dysuria Mild to mod, can't ro uti - for cipro empiric course, ua and culture, to f/u any worsening symptoms or concerns   Gross hematuria Has hx of renal stones but no other s/s of active stone for now, cont to follow , consider referral back to urology r/o other such as malignancy  Vitamin D deficiency Last vitamin D Lab Results  Component Value Date   VD25OH 45.75 09/13/2023   Stable, cont oral replacement   Diabetes (HCC) Lab Results  Component Value Date   HGBA1C 7.1 (A) 01/19/2024   Mild uncontrolled, pt to continue current medical treatment glucotrol xl 2.5 every day, metformin ER 500 mg- 4 every day, declines other change for now   CKD (chronic kidney disease) stage 3, GFR 30-59 ml/min (HCC) Lab Results  Component Value Date   CREATININE 1.25 (H) 01/08/2024   Stable overall, cont to avoid nephrotoxins  Followup: Return if symptoms worsen or fail to improve.  Oliver Barre, MD 02/17/2024 8:37 PM  [facility] [place] [facility] - [facility]

## 2024-02-17 NOTE — Telephone Encounter (Signed)
  Chief Complaint: urinary symptoms Symptoms: few drops of blood in urine, burning with urination, bladder pressure Frequency: x couple of days Pertinent Negatives: Patient denies fever, back or flank pain, nausea, vomiting. Disposition: [] ED /[] Urgent Care (no appt availability in office) / [x] Appointment(In office/virtual)/ []  [facility]/ [] Home Care/ [] Refused Recommended Disposition /[] [facility]/ []  Follow-up with PCP Additional Notes: Wife on the phone for triage. She is able to text patient to ask triage questions as she is not with him at this time. Advised patient can take tylenol or ibuprofen for pain in the meantime. Agreeable to acute visit with PCP today.  Copied from CRM [id number]. Topic: Clinical - Red Word Triage >> Feb 17, 2024 10:40 AM Gurney Maxin H wrote: [facility] that prompted transfer to Nurse Triage: Possible UTI, blood in urine, burning, pain when urinating and pressure. Reason for Disposition  Blood in urine (red, pink, or tea-colored)  Answer Assessment - Initial Assessment Questions 1. SEVERITY: "How bad is the pain?"  (e.g., Scale 1-10; mild, moderate, or severe)   - MILD (1-3): Complains slightly about urination hurting.   - MODERATE (4-7): Interferes with normal activities.     - SEVERE (8-10): Excruciating, unwilling or unable to urinate because of the pain.      8/10 when urinating,  2. FREQUENCY: "How many times have you had painful urination today?"      Couple of times.  3. PATTERN: "Is pain present every time you urinate or just sometimes?"      Every time. 4. ONSET: "When did the painful urination start?"      Couple of days ago. 1-2 days ago.  5. FEVER: "Do you have a fever?" If Yes, ask: "What is your temperature, how was it measured, and when did it start?"     Denies.  6. PAST UTI: "Have you had a urine infection before?" If Yes, ask: "When was the last time?" and "What happened that time?"      Yes, in the last few  years.  7. CAUSE: "What do you think is causing the painful urination?"      Patient told his wife this feels like a UTI.  8. OTHER SYMPTOMS: "Do you have any other symptoms?" (e.g., flank pain, penis discharge, scrotal pain, blood in urine)     Few drops of blood in urine, bladder pressure.  Protocols used: Urination Pain - Male-A-AH

## 2024-02-17 NOTE — Assessment & Plan Note (Signed)
 Lab Results  Component Value Date   HGBA1C 7.1 (A) 01/19/2024   Mild uncontrolled, pt to continue current medical treatment glucotrol xl 2.5 every day, metformin ER 500 mg- 4 every day, declines other change for now

## 2024-02-17 NOTE — Assessment & Plan Note (Signed)
Last vitamin D Lab Results  Component Value Date   VD25OH 45.75 09/13/2023   Stable, cont oral replacement

## 2024-02-17 NOTE — Assessment & Plan Note (Signed)
 Has hx of renal stones but no other s/s of active stone for now, cont to follow , consider referral back to urology r/o other such as malignancy

## 2024-02-17 NOTE — Assessment & Plan Note (Signed)
 Lab Results  Component Value Date   CREATININE 1.25 (H) 01/08/2024   Stable overall, cont to avoid nephrotoxins

## 2024-02-18 ENCOUNTER — Encounter: Payer: Self-pay | Admitting: Internal Medicine

## 2024-02-18 ENCOUNTER — Other Ambulatory Visit: Payer: Self-pay | Admitting: Internal Medicine

## 2024-02-18 DIAGNOSIS — R310 Gross hematuria: Secondary | ICD-10-CM

## 2024-02-18 LAB — URINALYSIS, ROUTINE W REFLEX MICROSCOPIC
Bacteria, UA: NONE SEEN /HPF
Bilirubin Urine: NEGATIVE
Glucose, UA: NEGATIVE
Hgb urine dipstick: NEGATIVE
Hyaline Cast: NONE SEEN /LPF
Ketones, ur: NEGATIVE
Nitrite: NEGATIVE
Protein, ur: NEGATIVE
RBC / HPF: NONE SEEN /HPF (ref 0–2)
Specific Gravity, Urine: 1.017 (ref 1.001–1.035)
Squamous Epithelial / HPF: NONE SEEN /HPF (ref <=5)
pH: <=5 (ref 5.0–8.0)

## 2024-02-18 LAB — URINE CULTURE: Result:: NO GROWTH

## 2024-02-18 LAB — EXTRA URINE SPECIMEN

## 2024-03-07 ENCOUNTER — Encounter: Payer: Self-pay | Admitting: Internal Medicine

## 2024-03-07 ENCOUNTER — Ambulatory Visit: Payer: PPO | Admitting: Internal Medicine

## 2024-03-07 VITALS — BP 116/70 | HR 64 | Ht 74.0 in | Wt 262.0 lb

## 2024-03-07 DIAGNOSIS — Z9884 Bariatric surgery status: Secondary | ICD-10-CM | POA: Diagnosis not present

## 2024-03-07 DIAGNOSIS — R1013 Epigastric pain: Secondary | ICD-10-CM | POA: Diagnosis not present

## 2024-03-07 DIAGNOSIS — R932 Abnormal findings on diagnostic imaging of liver and biliary tract: Secondary | ICD-10-CM

## 2024-03-07 NOTE — Patient Instructions (Signed)
 Please contact us if you have pain again.   _______________________________________________________  If your blood pressure at your visit was 140/90 or greater, please contact your primary care physician to follow up on this.  _______________________________________________________  If you are age 70 or older, your body mass index should be between 23-30. Your Body mass index is 33.64 kg/m. If this is out of the aforementioned range listed, please consider follow up with your Primary Care Provider.  If you are age 39 or younger, your body mass index should be between 19-25. Your Body mass index is 33.64 kg/m. If this is out of the aformentioned range listed, please consider follow up with your Primary Care Provider.   ________________________________________________________  The [facility] providers would like to encourage you to use [facility] to communicate with providers for non-urgent requests or questions.  Due to long hold times on the telephone, sending your provider a message by [facility] may be a faster and more efficient way to get a response.  Please allow 48 business hours for a response.  Please remember that this is for non-urgent requests.  _______________________________________________________   I appreciate the opportunity to care for you. Stan Head, MD, [facility]

## 2024-03-07 NOTE — Progress Notes (Signed)
 [name] 68 y.o. 09/16/1956 [id number]  Assessment & Plan:   Encounter Diagnoses  Name Primary?   Abdominal pain, epigastric Yes   Abnormal CT scan, gallbladder - gallstones? but none on ultrasound    History of Roux-en-Y gastric bypass    Cause of pain is not clear.  Sounds like it could have been biliary colic but it was immediate onset which is atypical I think.  Conflicting evidence between CT scans which suggest cholelithiasis and ultrasound which does not.  We have decided to continue twice daily omeprazole 40 mg, monitor and if he has recurrent similar symptoms likely referred to surgery to consider cholecystectomy.  Return here as needed otherwise.   CC: Corwin Levins, MD     Subjective:   Chief Complaint: Follow-up hospitalization for epigastric pain  HPI 68 year old man, Jehovah's Witness, prior CAD, prior pulmonary embolism not anticoagulated due to prior GI bleeding, diabetes dyslipidemia Roux-en-Y gastric bypass and GERD who was hospitalized to 2/25 with epigastric pain and had an EGD by Dr. Doy Hutching that showed scant ulceration at his gastroenteric anastomosis.  H. pylori biopsies were negative.  He had had an unremarkable EGD in 2023 (July).  During the admission he had had acute severe epigastric pain after eating eggs.  CT showed redundant colon diffuse stool patulous esophagus and a distended gallbladder with dependent stones though an ultrasound showed no gallstones and was normal.  I have personally reviewed the images from the CT and ultrasound.  HIDA scan was normal.  So were LFTs.  He was placed on twice daily omeprazole up from daily omeprazole on admission.  He reports that he has not had pain like that again.  He feels improved at this time.  He says it was actually sort of lower sternal pain as well.  Very intense felt like he might have been having a heart attack.  He has never had pain like that before.  He had seen [place] in the office in 2023 for  dyspepsia and weight loss and the EGD was unremarkable a CT scan in late 2022 had shown cholelithiasis also.  He has had episodic abdominal pain. Wt Readings from Last 3 Encounters:  03/07/24 262 lb (118.8 kg)  02/17/24 261 lb (118.4 kg)  01/19/24 259 lb (117.5 kg)    Allergies  Allergen Reactions   Other Other (See Comments)    NO BLOOD -JEHOVAH'S WITNESS-SIGNED REFUSAL BUT WOULD TAKE ALBUMIN   Adhesive [Tape] Other (See Comments)    Painful, Please use "paper" tape   Current Meds  Medication Sig   Artificial Tear Ointment (ARTIFICIAL TEARS) ointment Place 1 drop into both eyes every 4 (four) hours as needed (dry eyes).    atorvastatin (LIPITOR) 80 MG tablet TAKE 1 TABLET BY MOUTH EVERY DAY   chlorthalidone (HYGROTON) 25 MG tablet Take 1 tablet (25 mg total) by mouth daily.   Cholecalciferol (VITAMIN D3) 125 MCG (5000 UT) CAPS Take 5,000 Units by mouth daily.    ferrous sulfate 325 (65 FE) MG EC tablet TAKE 1 TABLET BY MOUTH EVERY DAY   folic acid (FOLVITE) 1 MG tablet TAKE 1 TABLET BY MOUTH EVERY DAY   glipiZIDE (GLUCOTROL XL) 2.5 MG 24 hr tablet TAKE 1 TABLET BY MOUTH EVERY DAY WITH BREAKFAST   metFORMIN (GLUCOPHAGE-XR) 500 MG 24 hr tablet Take 4 tablets (2,000 mg total) by mouth daily with breakfast.   Multiple Vitamin (MULTIVITAMIN WITH MINERALS) TABS tablet Take 1 tablet by mouth daily at 12 noon.  nitroGLYCERIN (NITROSTAT) 0.4 MG SL tablet Place 1 tablet (0.4 mg total) under the tongue every 5 (five) minutes as needed for chest pain.   omeprazole (PRILOSEC) 40 MG capsule Take 1 capsule (40 mg total) by mouth 2 (two) times daily.   ONETOUCH ULTRA TEST test strip USE AS INSTRUCTED   vitamin B-12 1000 MCG tablet Take 1 tablet (1,000 mcg total) by mouth daily.   Past Medical History:  Diagnosis Date   Arthritis    BACK PAIN    CAD (coronary artery disease) 06/28/2017   CAD in native artery    a. moderate by cardiac CT 2016.   Cancer [facility])    Chronic bilateral low back pain  with bilateral sciatica 01/12/2019   Degenerative arthritis of right knee 07/15/2016   Injected in 07/15/2016 Orthovisc injection started 10/07/2016 DepoMedrol 40 inj on 04/11/17, 25 cc aspirated Orthovisc injection in 05/30/2017     DEGENERATIVE JOINT DISEASE, KNEES, BILATERAL    Depression    Diabetes (HCC) 10/01/2014   Diabetes mellitus    type II   DIVERTICULOSIS, COLON    DVT, lower extremity (HCC)    right   GERD (gastroesophageal reflux disease)    History of kidney stones    Hyperlipidemia    Hypertension    HYPOGONADISM, MALE    HYPOTENSION, ORTHOSTATIC    Kidney stones    Long term (current) use of anticoagulants 08/17/2017   Long term current use of anticoagulant    Lumbosacral radiculopathy at S1 06/14/2012   Maxillary sinus cyst 12/11/2018   Obesity    OBESITY, MORBID 03/04/2008   Qualifier: Diagnosis of  By: Jonny Ruiz MD, Len Blalock    OSA (obstructive sleep apnea) 09/11/2013   Pulmonary emboli (HCC)    Pulmonary embolism (HCC) 02/05/2011   RBBB 07/28/2017   Renal insufficiency    a. prior h/o, does not appear chronic.   Superficial phlebitis of arm 03/18/2016   Vitamin D deficiency 07/09/2020   Past Surgical History:  Procedure Laterality Date   ankle surgury  2001 bilateral   with bone spurs and torn tendon   BIOPSY  01/09/2024   Procedure: BIOPSY;  Surgeon: Ottie Glazier, MD;  Location: [facility] ENDOSCOPY;  Service: Gastroenterology;;   COLONOSCOPY  03/18/2010   EDG  2008   chronic Duodenitis   ESOPHAGOGASTRODUODENOSCOPY (EGD) WITH PROPOFOL N/A 01/09/2024   Procedure: ESOPHAGOGASTRODUODENOSCOPY (EGD) WITH PROPOFOL;  Surgeon: Ottie Glazier, MD;  Location: [facility] ENDOSCOPY;  Service: Gastroenterology;  Laterality: N/A;   FLEXIBLE SIGMOIDOSCOPY N/A 11/03/2021   Procedure: FLEXIBLE SIGMOIDOSCOPY;  Surgeon: Sherrilyn Rist, MD;  Location: [facility] ENDOSCOPY;  Service: Gastroenterology;  Laterality: N/A;   GASTRIC BYPASS     HEEL SPUR SURGERY Bilateral 2005   IR ANGIOGRAM  PELVIS SELECTIVE OR SUPRASELECTIVE  11/02/2021   IR ANGIOGRAM PELVIS SELECTIVE OR SUPRASELECTIVE  11/04/2021   IR ANGIOGRAM SELECTIVE EACH ADDITIONAL VESSEL  11/02/2021   IR ANGIOGRAM SELECTIVE EACH ADDITIONAL VESSEL  11/02/2021   IR ANGIOGRAM SELECTIVE EACH ADDITIONAL VESSEL  11/02/2021   IR ANGIOGRAM SELECTIVE EACH ADDITIONAL VESSEL  11/04/2021   IR ANGIOGRAM SELECTIVE EACH ADDITIONAL VESSEL  11/04/2021   IR ANGIOGRAM SELECTIVE EACH ADDITIONAL VESSEL  11/04/2021   IR ANGIOGRAM SELECTIVE EACH ADDITIONAL VESSEL  11/04/2021   IR ANGIOGRAM VISCERAL SELECTIVE  11/02/2021   IR ANGIOGRAM VISCERAL SELECTIVE  11/04/2021   IR EMBO ART  VEN HEMORR LYMPH EXTRAV  INC GUIDE ROADMAPPING  11/02/2021   IR EMBO ART  VEN HEMORR LYMPH  EXTRAV  INC GUIDE ROADMAPPING  11/04/2021   IR EMBO ART  VEN HEMORR LYMPH EXTRAV  INC GUIDE ROADMAPPING  11/04/2021   IR RADIOLOGIST EVAL & MGMT  06/01/2023   IR US GUIDE VASC ACCESS RIGHT  11/02/2021   IR US GUIDE VASC ACCESS RIGHT  11/04/2021   knee surgury  2000 & 2003    cartilage damage   LEFT HEART CATH AND CORONARY ANGIOGRAPHY N/A 02/16/2021   Procedure: LEFT HEART CATH AND CORONARY ANGIOGRAPHY;  Surgeon: Tonny Bollman, MD;  Location[facility] INVASIVE CV LAB;  Service: Cardiovascular;  Laterality: N/A;   LUMBAR LAMINECTOMY/DECOMPRESSION MICRODISCECTOMY  09/28/2012   Procedure: LUMBAR LAMINECTOMY/DECOMPRESSION MICRODISCECTOMY 2 LEVELS;  Surgeon: Drucilla Schmidt, MD;  Location: [facility] ORS;  Service: Orthopedics;  Laterality: Left;  Decompressive laminectomy L4-L5. Microdiscectomy L5-S1   Skin removal surgery     extra abdominal skin removed from his weight loss   TONSILLECTOMY AND ADENOIDECTOMY  age 35 or 6   TOTAL KNEE ARTHROPLASTY  01/10/2012   Procedure: TOTAL KNEE ARTHROPLASTY;  Surgeon: Shelda Pal, MD;  Location: [facility] ORS;  Service: Orthopedics;  Laterality: Left;   TOTAL KNEE ARTHROPLASTY Right 11/18/2020   Procedure: TOTAL KNEE ARTHROPLASTY;  Surgeon: Durene Romans, MD;   Location: [facility] ORS;  Service: Orthopedics;  Laterality: Right;  70 mins   UPPER GASTROINTESTINAL ENDOSCOPY  03/18/2010   Social History   Social History Narrative   Daily caffeine use one per day   Protein drinks    Children; a friend and sister that helps   Brother that is around [place])   Son and dtr in [place]   Lives w/ wife in [place]   Left handed     Retired from [employer]   Former smoker 2 alcoholic beverages daily no caffeine no drug use no other tobacco at this time   family history includes Diabetes in his brother, sister, sister, and sister; Heart failure in his sister; Hypertension in his mother; Prostate cancer in his father.   Review of Systems Otherwise negative.  Objective:   Physical Exam BP 116/70   Pulse 64   Ht 6\' 2"  (1.88 m)   Wt 262 lb (118.8 kg)   SpO2 97%   BMI 33.64 kg/m

## 2024-03-13 ENCOUNTER — Ambulatory Visit: Payer: PPO | Admitting: Internal Medicine

## 2024-03-26 ENCOUNTER — Ambulatory Visit: Payer: PPO | Admitting: Internal Medicine

## 2024-03-27 ENCOUNTER — Ambulatory Visit (HOSPITAL_COMMUNITY): Payer: PPO | Attending: Cardiovascular Disease

## 2024-03-27 DIAGNOSIS — I7141 Pararenal abdominal aortic aneurysm, without rupture: Secondary | ICD-10-CM | POA: Insufficient documentation

## 2024-03-27 DIAGNOSIS — I77810 Thoracic aortic ectasia: Secondary | ICD-10-CM | POA: Diagnosis not present

## 2024-03-27 DIAGNOSIS — I77819 Aortic ectasia, unspecified site: Secondary | ICD-10-CM | POA: Insufficient documentation

## 2024-03-27 LAB — ECHOCARDIOGRAM COMPLETE
Area-P 1/2: 2.44 cm2
S' Lateral: 2.7 cm

## 2024-03-28 DIAGNOSIS — H35033 Hypertensive retinopathy, bilateral: Secondary | ICD-10-CM | POA: Diagnosis not present

## 2024-03-28 DIAGNOSIS — H52223 Regular astigmatism, bilateral: Secondary | ICD-10-CM | POA: Diagnosis not present

## 2024-03-28 DIAGNOSIS — H524 Presbyopia: Secondary | ICD-10-CM | POA: Diagnosis not present

## 2024-03-28 DIAGNOSIS — H35413 Lattice degeneration of retina, bilateral: Secondary | ICD-10-CM | POA: Diagnosis not present

## 2024-03-28 DIAGNOSIS — H2513 Age-related nuclear cataract, bilateral: Secondary | ICD-10-CM | POA: Diagnosis not present

## 2024-03-28 DIAGNOSIS — E119 Type 2 diabetes mellitus without complications: Secondary | ICD-10-CM | POA: Diagnosis not present

## 2024-03-28 DIAGNOSIS — H5213 Myopia, bilateral: Secondary | ICD-10-CM | POA: Diagnosis not present

## 2024-03-28 LAB — HM DIABETES EYE EXAM

## 2024-03-30 ENCOUNTER — Encounter: Payer: Self-pay | Admitting: Cardiology

## 2024-03-30 NOTE — Progress Notes (Signed)
 Normal LV systolic function, mild aortic root dilatation that is very stable from previous study 11/09/2022.

## 2024-04-09 ENCOUNTER — Encounter: Payer: Self-pay | Admitting: Cardiology

## 2024-04-09 ENCOUNTER — Ambulatory Visit: Attending: Cardiology | Admitting: Cardiology

## 2024-04-09 VITALS — BP 112/70 | HR 62 | Resp 16 | Ht 74.0 in | Wt 260.0 lb

## 2024-04-09 DIAGNOSIS — I10 Essential (primary) hypertension: Secondary | ICD-10-CM | POA: Diagnosis not present

## 2024-04-09 DIAGNOSIS — I2510 Atherosclerotic heart disease of native coronary artery without angina pectoris: Secondary | ICD-10-CM

## 2024-04-09 DIAGNOSIS — E7800 Pure hypercholesterolemia, unspecified: Secondary | ICD-10-CM | POA: Diagnosis not present

## 2024-04-09 DIAGNOSIS — N1831 Chronic kidney disease, stage 3a: Secondary | ICD-10-CM | POA: Diagnosis not present

## 2024-04-09 DIAGNOSIS — E1122 Type 2 diabetes mellitus with diabetic chronic kidney disease: Secondary | ICD-10-CM | POA: Diagnosis not present

## 2024-04-09 NOTE — Patient Instructions (Signed)
 Medication Instructions:  Your physician recommends that you continue on your current medications as directed. Please refer to the Current Medication list given to you today.  *If you need a refill on your cardiac medications before your next appointment, please call your pharmacy*  Follow-Up: At [facility] you and your health needs are our priority.  As part of our continuing mission to provide you with exceptional heart care, our providers are all part of one team.  This team includes your primary Cardiologist (physician) and Advanced Practice Providers or APPs (Physician Assistants and Nurse Practitioners) who all work together to provide you with the care you need, when you need it.  Your next appointment:   As needed  The format for your next appointment:   In Person  Provider:   Knox Perl, MD{  We recommend signing up for the patient portal called "MyChart".  Sign up information is provided on this After Visit Summary.  MyChart is used to connect with patients for Virtual Visits (Telemedicine).  Patients are able to view lab/test results, encounter notes, upcoming appointments, etc.  Non-urgent messages can be sent to your provider as well.   To learn more about what you can do with MyChart, go to [email or web address].

## 2024-04-09 NOTE — Progress Notes (Signed)
 Cardiology Office Note:  .   Date:  04/09/2024  ID:  [name], [name] 05-Sep-1956, MRN [id number] PCP: [name] Coombe, MD  [facility] Providers Cardiologist:  [name] [name], MD   History of Present Illness: .   [name] is a 68 y.o. African-American male patient with hypertension, hypercholesterolemia, moderate coronary artery disease by cardiac catheterization in 2022 of 50% mid LAD stenosis, history of recurren remote t PE and DVT and  type 2 diabetes mellitus, history of gastric bypass surgery sometime in 2015 and recurrent GI bleed, latest admission was February 2025 and he is [employer].  He has had prior GI bleed due to diverticulitis as well.  On this latest occasion, he was found to have ulceration at the suture line in the stomach.  He presents for annual visit.  Discussed the use of AI scribe software for clinical note transcription with the patient, who gave verbal consent to proceed.  History of Present Illness [name] is a 68 year old male with coronary artery disease and gastrointestinal bleeding who presents for follow-up of his cardiac and bleeding conditions.   He has coronary artery disease with a 50% narrowing in one artery identified in 2022. He takes atorvastatin  80 mg daily, maintaining cholesterol levels below 70 mg/dL. He experiences no recent chest pain and maintains stable weight. He walks for an hour three times a week and has gained 20 pounds since last summer, actively managing weight through diet and exercise.  He has severe anemia and gastrointestinal bleeding, with a recent episode in February 2025 involving nausea, vomiting, and a small ulcer. A rectal bleed occurred in June 2024, with hemoglobin dropping to 4.3 g/dL during one episode. He has bleeding from a prostate biopsy and diverticular colitis. As a Je[employer]'s Witness, he does not receive blood transfusions.  He has high blood pressure managed with chlorthalidone , with no  current symptoms. He has stage 3 chronic kidney disease, monitored by his nephrologist, with a follow-up in three months. He underwent gastric bypass surgery in 2014 or 2015, with no recent significant stomach bleeding since addressing prostate biopsy bleeding.  Labs   Lab Results  Component Value Date   CHOL 98 09/13/2023   HDL 42.10 09/13/2023   LDLCALC 43 09/13/2023   LDLDIRECT 116.0 01/15/2020   TRIG 60.0 09/13/2023   CHOLHDL 2 09/13/2023   Lab Results  Component Value Date   NA 136 01/08/2024   K 4.0 01/08/2024   CO2 24 01/08/2024   GLUCOSE 103 (H) 01/08/2024   BUN 19 01/08/2024   CREATININE 1.25 (H) 01/08/2024   CALCIUM  9.1 01/08/2024   GFR 50.34 (L) 09/13/2023   EGFR 54 (L) 03/23/2023   GFRNONAA >60 01/08/2024      Latest Ref Rng & Units 01/08/2024    7:47 AM 01/07/2024   10:03 AM 09/13/2023    3:21 PM  BMP  Glucose 70 - 99 mg/dL 295  188  416   BUN 8 - 23 mg/dL 19  26  22    Creatinine 0.61 - 1.24 mg/dL 6.06  3.01  6.01   Sodium 135 - 145 mmol/L 136  139  138   Potassium 3.5 - 5.1 mmol/L 4.0  4.7  4.5   Chloride 98 - 111 mmol/L 103  104  102   CO2 22 - 32 mmol/L 24  23  27    Calcium  8.9 - 10.3 mg/dL 9.1  9.3  9.7       Latest  Ref Rng & Units 01/08/2024    7:48 AM 01/07/2024   10:03 AM 10/31/2023   12:16 PM  CBC  WBC 4.0 - 10.5 K/uL 6.0  6.0  5.9   Hemoglobin 13.0 - 17.0 g/dL 81.1  91.4  78.2   Hematocrit 39.0 - 52.0 % 40.4  43.2  38.1   Platelets 150 - 400 K/uL 162  185  173    Lab Results  Component Value Date   HGBA1C 7.1 (A) 01/19/2024    Lab Results  Component Value Date   TSH 2.62 06/22/2023     ROS  Review of Systems  Cardiovascular:  Negative for chest pain, dyspnea on exertion and leg swelling.    Physical Exam:   VS:  BP 112/70 (BP Location: Left Arm, Patient Position: Sitting, Cuff Size: Large)   Pulse 62   Resp 16   Ht 6\' 2"  (1.88 m)   Wt 260 lb (117.9 kg)   SpO2 97%   BMI 33.38 kg/m    Wt Readings from Last 3 Encounters:  04/09/24  260 lb (117.9 kg)  03/07/24 262 lb (118.8 kg)  02/17/24 261 lb (118.4 kg)    Physical Exam Neck:     Vascular: No carotid bruit or JVD.  Cardiovascular:     Rate and Rhythm: Normal rate and regular rhythm.     Pulses: Intact distal pulses.     Heart sounds: Normal heart sounds. No murmur heard.    No gallop.  Pulmonary:     Effort: Pulmonary effort is normal.     Breath sounds: Normal breath sounds.  Abdominal:     General: Bowel sounds are normal.     Palpations: Abdomen is soft.  Musculoskeletal:     Right lower leg: No edema.     Left lower leg: No edema.    Studies Reviewed: Aaron Aas    CARDIAC CATHETERIZATION 02/16/2021    ECHOCARDIOGRAM COMPLETE 03/27/2024  1. Left ventricular ejection fraction, by estimation, is 60 to 65%. Left ventricular ejection fraction by 3D volume is 52 %. The left ventricle has normal function. The left ventricle has no regional wall motion abnormalities. There is mild concentric left ventricular hypertrophy. Left ventricular diastolic parameters are consistent with Grade I diastolic dysfunction (impaired relaxation). 2. Right ventricular systolic function is normal. The right ventricular size is normal. 3. The mitral valve is normal in structure. No evidence of mitral valve regurgitation. No evidence of mitral stenosis. 4. The aortic valve is tricuspid. Aortic valve regurgitation is not visualized. No aortic stenosis is present. 5. Aortic dilatation noted. There is borderline dilatation of the ascending aorta, measuring 39 mm. There is mild dilatation of the aortic root, measuring 40 mm.  EKG:    EKG 01/07/2024: Normal sinus rhythm at the rate of 54 bpm, normal axis.  Right bundle branch block.  No significant change from 06/25/2023.  Medications and allergies    Allergies  Allergen Reactions   Other Other (See Comments)    NO BLOOD -JEHOVAH'S WITNESS-SIGNED REFUSAL BUT WOULD TAKE ALBUMIN    Adhesive [Tape] Other (See Comments)    Painful, Please use  "paper" tape     Current Outpatient Medications:    Artificial Tear Ointment (ARTIFICIAL TEARS) ointment, Place 1 drop into both eyes every 4 (four) hours as needed (dry eyes). , Disp: , Rfl:    atorvastatin  (LIPITOR) 80 MG tablet, TAKE 1 TABLET BY MOUTH EVERY DAY, Disp: 90 tablet, Rfl: 3   chlorthalidone  (HYGROTON ) 25 MG tablet, Take  1 tablet (25 mg total) by mouth daily., Disp: 90 tablet, Rfl: 3   Cholecalciferol (VITAMIN D3) 125 MCG (5000 UT) CAPS, Take 5,000 Units by mouth daily. , Disp: , Rfl:    ferrous sulfate  325 (65 FE) MG EC tablet, TAKE 1 TABLET BY MOUTH EVERY DAY, Disp: 90 tablet, Rfl: 1   folic acid  (FOLVITE ) 1 MG tablet, TAKE 1 TABLET BY MOUTH EVERY DAY, Disp: 90 tablet, Rfl: 1   glipiZIDE  (GLUCOTROL  XL) 2.5 MG 24 hr tablet, TAKE 1 TABLET BY MOUTH EVERY DAY WITH BREAKFAST, Disp: 90 tablet, Rfl: 3   metFORMIN  (GLUCOPHAGE -XR) 500 MG 24 hr tablet, Take 4 tablets (2,000 mg total) by mouth daily with breakfast., Disp: 360 tablet, Rfl: 3   Multiple Vitamin (MULTIVITAMIN WITH MINERALS) TABS tablet, Take 1 tablet by mouth daily at 12 noon., Disp: , Rfl:    nitroGLYCERIN  (NITROSTAT ) 0.4 MG SL tablet, Place 1 tablet (0.4 mg total) under the tongue every 5 (five) minutes as needed for chest pain., Disp: 90 tablet, Rfl: 3   omeprazole  (PRILOSEC) 40 MG capsule, Take 1 capsule (40 mg total) by mouth 2 (two) times daily., Disp: 90 capsule, Rfl: 3   ONETOUCH ULTRA TEST test strip, USE AS INSTRUCTED, Disp: 100 strip, Rfl: 12   vitamin B-12 1000 MCG tablet, Take 1 tablet (1,000 mcg total) by mouth daily., Disp: 30 tablet, Rfl: 1   No orders of the defined types were placed in this encounter.    There are no discontinued medications.   ASSESSMENT AND PLAN: .      ICD-10-CM   1. Coronary artery disease involving native coronary artery of native heart without angina pectoris  I25.10     2. Pure hypercholesterolemia  E78.00     3. Primary hypertension  I10     4. Type 2 diabetes mellitus  with stage 3a chronic kidney disease, without long-term current use of insulin  (HCC)  E11.22    N18.31       Assessment and Plan Assessment & Plan Coronary artery disease with 50% LAD blockage   A CT angiogram in 2022 identified a 50% blockage in the mid LAD, which does not require intervention beyond medical management. Atorvastatin  80 mg daily effectively stabilizes plaque buildup. His heart function remains strong with no cardiac events, despite severe anemia and low hemoglobin levels. He is not on aspirin  due to recurrent bleeding issues. Continue atorvastatin  80 mg daily.  Hypertension, well-controlled on medication   Hypertension is well-controlled with chlorthalidone . Consider switching to an ACE inhibitor or ARB, such as lisinopril  or losartan, for renal protection due to stage 3 chronic kidney disease. These medications also offer cardiovascular benefits. Discuss with the primary care provider about starting lisinopril  or losartan.  Chronic kidney disease, stage 3   Stage 3 chronic kidney disease is likely secondary to diabetes. Current management does not include an ACE inhibitor or ARB, which could provide renal protection. Discuss with the primary care provider about starting lisinopril  or losartan. Monitor kidney function regularly to assess the impact of any medication changes.  Type 2 diabetes mellitus, well-controlled   Type 2 diabetes is well-controlled. Monitor kidney function closely due to the potential impact of diabetes on renal health. Continue current diabetes management and monitor kidney function regularly.  Severe anemia with recurrent GI bleeding   Severe anemia with recurrent GI bleeding, including a recent episode with hemoglobin dropping to 4.3. As a Je[employer] Witness, he does not accept blood transfusions. The bleeding sources include gastric  ulcers and rectal bleeding. Avoid aspirin  due to bleeding risk and monitor hemoglobin levels regularly.  In spite of  bleeding and severe anemia, he did not have any cardiac issues during hospitalization suggesting adequate coronary reserve.  Signed,  Knox Perl, MD, [facility] 04/09/2024, 2:37 PM [facility] [address] [place], [place] [address] Phone: [phone]. Fax:  [phone]

## 2024-05-15 ENCOUNTER — Other Ambulatory Visit: Payer: Self-pay | Admitting: Hematology

## 2024-05-15 ENCOUNTER — Other Ambulatory Visit: Payer: Self-pay | Admitting: Internal Medicine

## 2024-05-15 DIAGNOSIS — E538 Deficiency of other specified B group vitamins: Secondary | ICD-10-CM

## 2024-05-17 ENCOUNTER — Encounter (INDEPENDENT_AMBULATORY_CARE_PROVIDER_SITE_OTHER): Payer: PPO | Admitting: Ophthalmology

## 2024-05-17 DIAGNOSIS — H2513 Age-related nuclear cataract, bilateral: Secondary | ICD-10-CM | POA: Diagnosis not present

## 2024-05-17 DIAGNOSIS — I10 Essential (primary) hypertension: Secondary | ICD-10-CM

## 2024-05-17 DIAGNOSIS — H43813 Vitreous degeneration, bilateral: Secondary | ICD-10-CM

## 2024-05-17 DIAGNOSIS — H33303 Unspecified retinal break, bilateral: Secondary | ICD-10-CM | POA: Diagnosis not present

## 2024-05-17 DIAGNOSIS — H35033 Hypertensive retinopathy, bilateral: Secondary | ICD-10-CM | POA: Diagnosis not present

## 2024-05-21 DIAGNOSIS — Z8546 Personal history of malignant neoplasm of prostate: Secondary | ICD-10-CM | POA: Diagnosis not present

## 2024-05-28 DIAGNOSIS — F5221 Male erectile disorder: Secondary | ICD-10-CM | POA: Diagnosis not present

## 2024-05-28 DIAGNOSIS — Z8546 Personal history of malignant neoplasm of prostate: Secondary | ICD-10-CM | POA: Diagnosis not present

## 2024-06-06 ENCOUNTER — Ambulatory Visit: Payer: PPO

## 2024-06-06 VITALS — Ht 74.0 in | Wt 260.0 lb

## 2024-06-06 DIAGNOSIS — E1122 Type 2 diabetes mellitus with diabetic chronic kidney disease: Secondary | ICD-10-CM | POA: Diagnosis not present

## 2024-06-06 DIAGNOSIS — Z0000 Encounter for general adult medical examination without abnormal findings: Secondary | ICD-10-CM

## 2024-06-06 DIAGNOSIS — N183 Chronic kidney disease, stage 3 unspecified: Secondary | ICD-10-CM | POA: Diagnosis not present

## 2024-06-06 NOTE — Patient Instructions (Signed)
 [name] , Thank you for taking time out of your busy schedule to complete your Annual Wellness Visit with me. I enjoyed our conversation and look forward to speaking with you again next year. I, as well as your care team,  appreciate your ongoing commitment to your health goals. Please review the following plan we discussed and let me know if I can assist you in the future. Your Game plan/ To Do List    Referrals: If you haven't heard from the office you've been referred to, please reach out to them at the phone provided.  Ordered a Diabetic Kidney Urine ACR (Lab) Follow up Visits: Next Medicare AWV with our clinical staff: 06/10/2025   Have you seen your provider in the last 6 months (3 months if uncontrolled diabetes)? Yes Next Office Visit with your provider: 06/19/2024  Clinician Recommendations:  Aim for 30 minutes of exercise or brisk walking, 6-8 glasses of water , and 5 servings of fruits and vegetables each day.       This is a list of the screening recommended for you and due dates:  Health Maintenance  Topic Date Due   Yearly kidney health urinalysis for diabetes  04/15/2011   COVID-19 Vaccine (7 - 2024-25 season) 08/07/2023   Complete foot exam   06/26/2024   Flu Shot  07/06/2024   Hemoglobin A1C  07/18/2024   Yearly kidney function blood test for diabetes  01/07/2025   Eye exam for diabetics  03/28/2025   Medicare Annual Wellness Visit  06/06/2025   DTaP/Tdap/Td vaccine (3 - Td or Tdap) 07/12/2029   Colon Cancer Screening  08/06/2030   Pneumococcal Vaccine for age over 3  Completed   Hepatitis C Screening  Completed   Zoster (Shingles) Vaccine  Completed   Hepatitis B Vaccine  Aged Out   HPV Vaccine  Aged Out   Meningitis B Vaccine  Aged Out    Advanced directives: (Provided) Advance directive discussed with you today. I have provided a copy for you to complete at home and have notarized. Once this is complete, please bring a copy in to our office so we can scan it into  your chart.  Advance Care Planning is important because it:  [x]  Makes sure you receive the medical care that is consistent with your values, goals, and preferences  [x]  It provides guidance to your family and loved ones and reduces their decisional burden about whether or not they are making the right decisions based on your wishes.  Follow the link provided in your after visit summary or read over the paperwork we have mailed to you to help you started getting your Advance Directives in place. If you need assistance in completing these, please reach out to us  so that we can help you!

## 2024-06-06 NOTE — Progress Notes (Signed)
 Subjective:   [name] is a 68 y.o. who presents for a Medicare Wellness preventive visit.  As a reminder, Annual Wellness Visits don't include a physical exam, and some assessments may be limited, especially if this visit is performed virtually. We may recommend an in-person follow-up visit with your provider if needed.  Visit Complete: Virtual I connected with  [name] on 06/06/24 by a audio enabled telemedicine application and verified that I am speaking with the correct person using two identifiers.  Patient Location: Home  Provider Location: Office/Clinic  I discussed the limitations of evaluation and management by telemedicine. The patient expressed understanding and agreed to proceed.  Vital Signs: Because this visit was a virtual/telehealth visit, some criteria may be missing or patient reported. Any vitals not documented were not able to be obtained and vitals that have been documented are patient reported.  VideoDeclined- This patient declined Librarian, academic. Therefore the visit was completed with audio only.  Persons Participating in Visit: Patient.  AWV Questionnaire: No: Patient Medicare AWV questionnaire was not completed prior to this visit.  Cardiac Risk Factors include: advanced age (>74men, >17 women);diabetes mellitus;dyslipidemia;hypertension;male gender;obesity (BMI >30kg/m2)     Objective:    Today's Vitals   06/06/24 1421  Weight: 260 lb (117.9 kg)  Height: 6' 2 (1.88 m)   Body mass index is 33.38 kg/m.     06/06/2024    2:21 PM 01/07/2024    9:34 PM 06/25/2023    7:31 PM 06/01/2023   11:38 AM 04/14/2022   10:38 AM 01/05/2022    8:49 AM 11/19/2021    7:14 PM  Advanced Directives  Does Patient Have a Medical Advance Directive? No No No No No No No  Does patient want to make changes to medical advance directive?     No - Patient declined    Would patient like information on creating a medical advance  directive? Yes (MAU/Ambulatory/Procedural Areas - Information given) No - Patient declined No - Patient declined No - Patient declined   No - Patient declined    Current Medications (verified) Outpatient Encounter Medications as of 06/06/2024  Medication Sig   Artificial Tear Ointment (ARTIFICIAL TEARS) ointment Place 1 drop into both eyes every 4 (four) hours as needed (dry eyes).    atorvastatin  (LIPITOR) 80 MG tablet TAKE 1 TABLET BY MOUTH EVERY DAY   chlorthalidone  (HYGROTON ) 25 MG tablet Take 1 tablet (25 mg total) by mouth daily.   Cholecalciferol (VITAMIN D3) 125 MCG (5000 UT) CAPS Take 5,000 Units by mouth daily.    ferrous sulfate  325 (65 FE) MG EC tablet TAKE 1 TABLET BY MOUTH EVERY DAY   folic acid  (FOLVITE ) 1 MG tablet TAKE 1 TABLET BY MOUTH EVERY DAY   glipiZIDE  (GLUCOTROL  XL) 2.5 MG 24 hr tablet TAKE 1 TABLET BY MOUTH EVERY DAY WITH BREAKFAST   metFORMIN  (GLUCOPHAGE -XR) 500 MG 24 hr tablet Take 4 tablets (2,000 mg total) by mouth daily with breakfast.   Multiple Vitamin (MULTIVITAMIN WITH MINERALS) TABS tablet Take 1 tablet by mouth daily at 12 noon.   nitroGLYCERIN  (NITROSTAT ) 0.4 MG SL tablet Place 1 tablet (0.4 mg total) under the tongue every 5 (five) minutes as needed for chest pain.   omeprazole  (PRILOSEC) 40 MG capsule Take 1 capsule (40 mg total) by mouth 2 (two) times daily.   ONETOUCH ULTRA TEST test strip USE AS INSTRUCTED   vitamin B-12 1000 MCG tablet Take 1 tablet (1,000 mcg total)  by mouth daily.   No facility-administered encounter medications on file as of 06/06/2024.    Allergies (verified) Other and Adhesive [tape]   History: Past Medical History:  Diagnosis Date   Arthritis    BACK PAIN    CAD (coronary artery disease) 06/28/2017   CAD in native artery    a. moderate by cardiac CT 2016.   Cancer [facility])    Chronic bilateral low back pain with bilateral sciatica 01/12/2019   Degenerative arthritis of right knee 07/15/2016   Injected in 07/15/2016  Orthovisc injection started 10/07/2016 DepoMedrol 40 inj on 04/11/17, 25 cc aspirated Orthovisc injection in 05/30/2017     DEGENERATIVE JOINT DISEASE, KNEES, BILATERAL    Depression    Diabetes (HCC) 10/01/2014   Diabetes mellitus    type II   DIVERTICULOSIS, COLON    DVT, lower extremity (HCC)    right   GERD (gastroesophageal reflux disease)    History of kidney stones    Hyperlipidemia    Hypertension    HYPOGONADISM, MALE    HYPOTENSION, ORTHOSTATIC    Kidney stones    Long term (current) use of anticoagulants 08/17/2017   Long term current use of anticoagulant    Lumbosacral radiculopathy at S1 06/14/2012   Maxillary sinus cyst 12/11/2018   Obesity    OBESITY, MORBID 03/04/2008   Qualifier: Diagnosis of  By: Norleen MD, Lynwood ORN    OSA (obstructive sleep apnea) 09/11/2013   Pulmonary emboli (HCC)    Pulmonary embolism (HCC) 02/05/2011   RBBB 07/28/2017   Renal insufficiency    a. prior h/o, does not appear chronic.   Superficial phlebitis of arm 03/18/2016   Vitamin D  deficiency 07/09/2020   Past Surgical History:  Procedure Laterality Date   ankle surgury  2001 bilateral   with bone spurs and torn tendon   BIOPSY  01/09/2024   Procedure: BIOPSY;  Surgeon: Suzann Inocente HERO, MD;  Location: [facility] ENDOSCOPY;  Service: Gastroenterology;;   COLONOSCOPY  03/18/2010   EDG  2008   chronic Duodenitis   ESOPHAGOGASTRODUODENOSCOPY (EGD) WITH PROPOFOL  N/A 01/09/2024   Procedure: ESOPHAGOGASTRODUODENOSCOPY (EGD) WITH PROPOFOL ;  Surgeon: Suzann Inocente HERO, MD;  Location: [facility] ENDOSCOPY;  Service: Gastroenterology;  Laterality: N/A;   FLEXIBLE SIGMOIDOSCOPY N/A 11/03/2021   Procedure: FLEXIBLE SIGMOIDOSCOPY;  Surgeon: Legrand Victory LITTIE DOUGLAS, MD;  Location: [facility] ENDOSCOPY;  Service: Gastroenterology;  Laterality: N/A;   GASTRIC BYPASS     HEEL SPUR SURGERY Bilateral 2005   IR ANGIOGRAM PELVIS SELECTIVE OR SUPRASELECTIVE  11/02/2021   IR ANGIOGRAM PELVIS SELECTIVE OR SUPRASELECTIVE  11/04/2021   IR  ANGIOGRAM SELECTIVE EACH ADDITIONAL VESSEL  11/02/2021   IR ANGIOGRAM SELECTIVE EACH ADDITIONAL VESSEL  11/02/2021   IR ANGIOGRAM SELECTIVE EACH ADDITIONAL VESSEL  11/02/2021   IR ANGIOGRAM SELECTIVE EACH ADDITIONAL VESSEL  11/04/2021   IR ANGIOGRAM SELECTIVE EACH ADDITIONAL VESSEL  11/04/2021   IR ANGIOGRAM SELECTIVE EACH ADDITIONAL VESSEL  11/04/2021   IR ANGIOGRAM SELECTIVE EACH ADDITIONAL VESSEL  11/04/2021   IR ANGIOGRAM VISCERAL SELECTIVE  11/02/2021   IR ANGIOGRAM VISCERAL SELECTIVE  11/04/2021   IR EMBO ART  VEN HEMORR LYMPH EXTRAV  INC GUIDE ROADMAPPING  11/02/2021   IR EMBO ART  VEN HEMORR LYMPH EXTRAV  INC GUIDE ROADMAPPING  11/04/2021   IR EMBO ART  VEN HEMORR LYMPH EXTRAV  INC GUIDE ROADMAPPING  11/04/2021   IR RADIOLOGIST EVAL & MGMT  06/01/2023   IR US  GUIDE VASC ACCESS RIGHT  11/02/2021   IR  US  GUIDE VASC ACCESS RIGHT  11/04/2021   knee surgury  1999-07-09 & 2003    cartilage damage   LEFT HEART CATH AND CORONARY ANGIOGRAPHY N/A 02/16/2021   Procedure: LEFT HEART CATH AND CORONARY ANGIOGRAPHY;  Surgeon: Wonda Sharper, MD;  [facility] INVASIVE CV LAB;  Service: Cardiovascular;  Laterality: N/A;   LUMBAR LAMINECTOMY/DECOMPRESSION MICRODISCECTOMY  09/28/2012   Procedure: LUMBAR LAMINECTOMY/DECOMPRESSION MICRODISCECTOMY 2 LEVELS;  Surgeon: Lynwood SHAUNNA Bern, MD;  Location: [facility] ORS;  Service: Orthopedics;  Laterality: Left;  Decompressive laminectomy L4-L5. Microdiscectomy L5-S1   Skin removal surgery     extra abdominal skin removed from his weight loss   TONSILLECTOMY AND ADENOIDECTOMY  age 38 or 6   TOTAL KNEE ARTHROPLASTY  01/10/2012   Procedure: TOTAL KNEE ARTHROPLASTY;  Surgeon: Donnice JONETTA Car, MD;  Location: [facility] ORS;  Service: Orthopedics;  Laterality: Left;   TOTAL KNEE ARTHROPLASTY Right 11/18/2020   Procedure: TOTAL KNEE ARTHROPLASTY;  Surgeon: Car Donnice, MD;  Location: [facility] ORS;  Service: Orthopedics;  Laterality: Right;  70 mins   UPPER GASTROINTESTINAL ENDOSCOPY   03/18/2010   Family History  Problem Relation Age of Onset   Hypertension Mother    Prostate cancer Father    Diabetes Sister        died with DM 07-08-01, 2 more sisters with diabetes   Heart failure Sister    Diabetes Brother    Diabetes Sister    Diabetes Sister    Colon cancer Neg Hx    Colon polyps Neg Hx    Esophageal cancer Neg Hx    Rectal cancer Neg Hx    Stomach cancer Neg Hx    Social History   Socioeconomic History   Marital status: Married    Spouse name: Not on file   Number of children: 2   Years of education: Not on file   Highest education level: 12th grade  Occupational History   Occupation: Retired    Associate Professor: [employer]  Tobacco Use   Smoking status: Former    Current packs/day: 0.00    Average packs/day: 0.3 packs/day for 5.0 years (1.3 ttl pk-yrs)    Types: Cigarettes    Start date: 12/06/1973    Quit date: 12/06/1978    Years since quitting: 45.5   Smokeless tobacco: Never  Vaping Use   Vaping status: Never Used  Substance and Sexual Activity   Alcohol  use: Yes    Alcohol /week: 2.0 standard drinks of alcohol     Types: 2 Standard drinks or equivalent per week    Comment: occasionally   Drug use: No   Sexual activity: Not Currently  Other Topics Concern   Not on file  Social History Narrative   Daily caffeine use one per day   Protein drinks    Children; a friend and sister that helps   Brother that is around [place])   Son and dtr in [place]   Lives w/ wife in [place]   Left handed     Retired from [employer]   Former smoker 2 alcoholic beverages daily no caffeine no drug use no other tobacco at this time   Social Drivers of [employer]: Low Risk  (06/06/2024)   Overall Financial Resource Strain (CARDIA)    Difficulty of Paying Living Expenses: Not hard at all  Food Insecurity: No Food Insecurity (06/06/2024)   Hunger Vital Sign    Worried About Running Out of Food in the Last Year: Never true  Ran Out of Food  in the Last Year: Never true  Transportation Needs: No Transportation Needs (06/06/2024)   PRAPARE - Administrator, Civil Service (Medical): No    Lack of Transportation (Non-Medical): No  Physical Activity: Insufficiently Active (06/06/2024)   Exercise Vital Sign    Days of Exercise per Week: 3 days    Minutes of Exercise per Session: 40 min  Stress: No Stress Concern Present (06/06/2024)   [employer] of Occupational Health - Occupational Stress Questionnaire    Feeling of Stress: Not at all  Social Connections: Moderately Integrated (06/06/2024)   Social Connection and Isolation Panel    Frequency of Communication with Friends and Family: Once a week    Frequency of Social Gatherings with Friends and Family: Once a week    Attends Religious Services: More than 4 times per year    Active Member of [employer] or Organizations: Yes    Attends Engineer, structural: More than 4 times per year    Marital Status: Married    Tobacco Counseling Counseling given: No    Clinical Intake:  Pre-visit preparation completed: Yes  Pain : No/denies pain     BMI - recorded: 33.38 Nutritional Status: BMI > 30  Obese Nutritional Risks: None Diabetes: Yes CBG done?: No Did pt. bring in CBG monitor from home?: No  Lab Results  Component Value Date   HGBA1C 7.1 (A) 01/19/2024   HGBA1C 7.2 (H) 09/13/2023   HGBA1C 7.1 (H) 06/22/2023     How often do you need to have someone help you when you read instructions, pamphlets, or other written materials from your doctor or pharmacy?: 1 - Never  Interpreter Needed?: No  Information entered by :: Verdie Saba, CMA   Activities of Daily Living     06/06/2024    2:24 PM 01/07/2024    9:34 PM  In your present state of health, do you have any difficulty performing the following activities:  Hearing? 0 0  Vision? 0 0  Difficulty concentrating or making decisions? 0 0  Walking or climbing stairs? 0   Dressing or bathing? 0    Doing errands, shopping? 0 0  Preparing Food and eating ? N   Using the Toilet? N   In the past six months, have you accidently leaked urine? N   Do you have problems with loss of bowel control? N   Managing your Medications? N   Managing your Finances? N   Housekeeping or managing your Housekeeping? N     Patient Care Team: Norleen Lynwood ORN, MD as PCP - Diedre Ladona Heinz, MD as PCP - Cardiology (Cardiology) Bonner Ade, MD as Consulting Physician (Physical Medicine and Rehabilitation) Winfred Dama, MD as Referring Physician (Surgical Oncology) Kassie Mallick, MD (Inactive) as Consulting Physician (Endocrinology) Camillo Golas, MD as Consulting Physician (Ophthalmology)  I have updated your Care Teams any recent Medical Services you may have received from other providers in the past year.     Assessment:   This is a routine wellness examination for Ean.  Hearing/Vision screen Hearing Screening - Comments:: Denies hearing difficulties   Vision Screening - Comments:: Wears rx glasses - up to date with routine eye exams with Dr Camillo   Goals Addressed               This Visit's Progress     Patient Stated (pt-stated)        Patient stated he wants to walk more -  exercises a little       Depression Screen     06/06/2024    2:25 PM 02/17/2024    4:14 PM 01/19/2024   10:13 AM 11/24/2023    8:52 AM 09/13/2023    2:34 PM 06/27/2023   10:26 AM 06/01/2023   11:36 AM  PHQ 2/9 Scores  PHQ - 2 Score 0 0 0 0 0 0 0  PHQ- 9 Score 0     0 0    Fall Risk     06/06/2024    2:25 PM 02/17/2024    4:21 PM 01/19/2024   10:18 AM 11/24/2023    8:52 AM 09/13/2023    2:34 PM  Fall Risk   Falls in the past year? 0 0 0 0 0  Number falls in past yr: 0 0 0 0 0  Injury with Fall? 0 0 0 0 0  Risk for fall due to : No Fall Risks No Fall Risks No Fall Risks No Fall Risks No Fall Risks  Follow up Falls evaluation completed;Falls prevention discussed Falls evaluation completed Falls  evaluation completed Falls evaluation completed Falls evaluation completed    MEDICARE RISK AT HOME:  Medicare Risk at Home Any stairs in or around the home?: No If so, are there any without handrails?: No Home free of loose throw rugs in walkways, pet beds, electrical cords, etc?: Yes Adequate lighting in your home to reduce risk of falls?: Yes Life alert?: No Use of a cane, walker or w/c?: No Grab bars in the bathroom?: Yes Shower chair or bench in shower?: Yes Elevated toilet seat or a handicapped toilet?: Yes  TIMED UP AND GO:  Was the test performed?  No  Cognitive Function: 6CIT completed    09/23/2017    3:24 PM 06/28/2016   10:05 AM  MMSE - Mini Mental State Exam  Not completed:  --  Orientation to time 5    Orientation to Place 5    Registration 3    Attention/ Calculation 5    Recall 2    Language- name 2 objects 2    Language- repeat 1   Language- follow 3 step command 3    Language- read & follow direction 1    Write a sentence 1    Copy design 1    Total score 29       Data saved with a previous flowsheet row definition        06/06/2024    2:27 PM 06/01/2023   11:39 AM 10/07/2020    1:57 PM  6CIT Screen  What Year? 0 points 0 points 0 points  What month? 0 points 0 points 0 points  What time? 0 points 0 points 0 points  Count back from 20 0 points 0 points 0 points  Months in reverse 0 points 0 points 0 points  Repeat phrase 2 points 0 points 0 points  Total Score 2 points 0 points 0 points    Immunizations Immunization History  Administered Date(s) Administered   Fluad Quad(high Dose 65+) 10/05/2019, 10/07/2020, 09/14/2021, 08/30/2022   Fluad Trivalent(High Dose 65+) 09/13/2023   Influenza Split 08/19/2011, 10/30/2012, 09/06/2016   Influenza Whole 09/21/2005, 09/02/2008, 12/24/2009   Influenza,inj,Quad PF,6+ Mos 09/13/2013, 09/30/2014, 10/29/2015, 11/15/2016, 09/23/2017, 09/19/2018   PFIZER Comirnaty(Gray Top)Covid-19 Tri-Sucrose Vaccine  07/09/2021, 08/30/2022   PFIZER(Purple Top)SARS-COV-2 Vaccination 01/03/2020, 01/27/2020, 10/31/2020   PNEUMOCOCCAL CONJUGATE-20 11/24/2023   Pfizer(Comirnaty)Fall Seasonal Vaccine 12 years and older 05/09/2023   Pneumococcal Conjugate-13 03/13/2014  Pneumococcal Polysaccharide-23 03/04/2008, 06/28/2016, 11/28/2018   Td 03/11/2009   Td (Adult), 2 Lf Tetanus Toxid, Preservative Free 03/11/2009   Tdap 07/13/2019   Zoster Recombinant(Shingrix) 08/30/2022, 05/09/2023   Zoster, Live 10/29/2015    Screening Tests Health Maintenance  Topic Date Due   Diabetic kidney evaluation - Urine ACR  04/15/2011   COVID-19 Vaccine (7 - 2024-25 season) 08/07/2023   FOOT EXAM  06/26/2024   INFLUENZA VACCINE  07/06/2024   HEMOGLOBIN A1C  07/18/2024   Diabetic kidney evaluation - eGFR measurement  01/07/2025   OPHTHALMOLOGY EXAM  03/28/2025   Medicare Annual Wellness (AWV)  06/06/2025   DTaP/Tdap/Td (3 - Td or Tdap) 07/12/2029   Colonoscopy  08/06/2030   Pneumococcal Vaccine: 50+ Years  Completed   Hepatitis C Screening  Completed   Zoster Vaccines- Shingrix  Completed   Hepatitis B Vaccines  Aged Out   HPV VACCINES  Aged Out   Meningococcal B Vaccine  Aged Out    Health Maintenance  Health Maintenance Due  Topic Date Due   Diabetic kidney evaluation - Urine ACR  04/15/2011   COVID-19 Vaccine (7 - 2024-25 season) 08/07/2023   Health Maintenance Items Addressed:  Labs Ordered: Diabetic Kidney Urine ACR  Additional Screening:  Vision Screening: Recommended annual ophthalmology exams for early detection of glaucoma and other disorders of the eye. Would you like a referral to an eye doctor? No    Dental Screening: Recommended annual dental exams for proper oral hygiene  Community Resource Referral / Chronic Care Management: CRR required this visit?  No   CCM required this visit?  No   Plan:    I have personally reviewed and noted the following in the patient's chart:   Medical  and social history Use of alcohol , tobacco or illicit drugs  Current medications and supplements including opioid prescriptions. Patient is not currently taking opioid prescriptions. Functional ability and status Nutritional status Physical activity Advanced directives List of other physicians Hospitalizations, surgeries, and ER visits in previous 12 months Vitals Screenings to include cognitive, depression, and falls Referrals and appointments  In addition, I have reviewed and discussed with patient certain preventive protocols, quality metrics, and best practice recommendations. A written personalized care plan for preventive services as well as general preventive health recommendations were provided to patient.   Verdie CHRISTELLA Saba, CMA   06/06/2024   After Visit Summary: (MyChart) Due to this being a telephonic visit, the after visit summary with patients personalized plan was offered to patient via MyChart   Notes: Scheduled a 4-mth Diabetes f/u appt w/PCP

## 2024-06-18 ENCOUNTER — Other Ambulatory Visit (INDEPENDENT_AMBULATORY_CARE_PROVIDER_SITE_OTHER)

## 2024-06-18 ENCOUNTER — Ambulatory Visit: Payer: Self-pay | Admitting: Internal Medicine

## 2024-06-18 ENCOUNTER — Other Ambulatory Visit: Payer: Self-pay | Admitting: Internal Medicine

## 2024-06-18 DIAGNOSIS — E1122 Type 2 diabetes mellitus with diabetic chronic kidney disease: Secondary | ICD-10-CM | POA: Diagnosis not present

## 2024-06-18 DIAGNOSIS — Z125 Encounter for screening for malignant neoplasm of prostate: Secondary | ICD-10-CM

## 2024-06-18 DIAGNOSIS — N183 Chronic kidney disease, stage 3 unspecified: Secondary | ICD-10-CM | POA: Diagnosis not present

## 2024-06-18 DIAGNOSIS — E538 Deficiency of other specified B group vitamins: Secondary | ICD-10-CM

## 2024-06-18 DIAGNOSIS — E559 Vitamin D deficiency, unspecified: Secondary | ICD-10-CM

## 2024-06-18 LAB — LIPID PANEL
Cholesterol: 117 mg/dL (ref 0–200)
HDL: 46.3 mg/dL (ref >39.00)
LDL Cholesterol: 57 mg/dL (ref 0–99)
NonHDL: 70.22
Total CHOL/HDL Ratio: 3
Triglycerides: 65 mg/dL (ref 0.0–149.0)
VLDL: 13 mg/dL (ref 0.0–40.0)

## 2024-06-18 LAB — CBC WITH DIFFERENTIAL/PLATELET
Basophils Absolute: 0 K/uL (ref 0.0–0.1)
Basophils Relative: 0.1 % (ref 0.0–3.0)
Eosinophils Absolute: 0.1 K/uL (ref 0.0–0.7)
Eosinophils Relative: 1.4 % (ref 0.0–5.0)
HCT: 40.6 % (ref 39.0–52.0)
Hemoglobin: 12.6 g/dL — ABNORMAL LOW (ref 13.0–17.0)
Lymphocytes Relative: 26.2 % (ref 12.0–46.0)
Lymphs Abs: 1.5 K/uL (ref 0.7–4.0)
MCHC: 31 g/dL (ref 30.0–36.0)
MCV: 72.5 fl — ABNORMAL LOW (ref 78.0–100.0)
Monocytes Absolute: 0.6 K/uL (ref 0.1–1.0)
Monocytes Relative: 9.7 % (ref 3.0–12.0)
Neutro Abs: 3.7 K/uL (ref 1.4–7.7)
Neutrophils Relative %: 62.6 % (ref 43.0–77.0)
Platelets: 157 K/uL (ref 150.0–400.0)
RBC: 5.6 Mil/uL (ref 4.22–5.81)
RDW: 15.2 % (ref 11.5–15.5)
WBC: 5.8 K/uL (ref 4.0–10.5)

## 2024-06-18 LAB — MICROALBUMIN / CREATININE URINE RATIO
Creatinine,U: 96.9 mg/dL
Microalb Creat Ratio: UNDETERMINED mg/g (ref 0.0–30.0)
Microalb, Ur: <0.7 mg/dL

## 2024-06-18 LAB — TSH: TSH: 2.76 u[IU]/mL (ref 0.35–5.50)

## 2024-06-18 LAB — BASIC METABOLIC PANEL WITH GFR
BUN: 25 mg/dL — ABNORMAL HIGH (ref 6–23)
CO2: 26 meq/L (ref 19–32)
Calcium: 9.3 mg/dL (ref 8.4–10.5)
Chloride: 103 meq/L (ref 96–112)
Creatinine, Ser: 1.27 mg/dL (ref 0.40–1.50)
GFR: 57.25 mL/min — ABNORMAL LOW (ref >60.00)
Glucose, Bld: 121 mg/dL — ABNORMAL HIGH (ref 70–99)
Potassium: 4.3 meq/L (ref 3.5–5.1)
Sodium: 138 meq/L (ref 135–145)

## 2024-06-18 LAB — HEPATIC FUNCTION PANEL
ALT: 41 U/L (ref 0–53)
AST: 23 U/L (ref 0–37)
Albumin: 4.6 g/dL (ref 3.5–5.2)
Alkaline Phosphatase: 90 U/L (ref 39–117)
Bilirubin, Direct: 0.1 mg/dL (ref 0.0–0.3)
Total Bilirubin: 0.6 mg/dL (ref 0.2–1.2)
Total Protein: 6.8 g/dL (ref 6.0–8.3)

## 2024-06-18 LAB — VITAMIN B12: Vitamin B-12: 350 pg/mL (ref 211–911)

## 2024-06-18 LAB — PSA: PSA: 0.46 ng/mL (ref 0.10–4.00)

## 2024-06-18 LAB — HEMOGLOBIN A1C: Hgb A1c MFr Bld: 7.5 % — ABNORMAL HIGH (ref 4.6–6.5)

## 2024-06-18 LAB — VITAMIN D 25 HYDROXY (VIT D DEFICIENCY, FRACTURES): VITD: 44.13 ng/mL (ref 30.00–100.00)

## 2024-06-18 MED ORDER — GLIPIZIDE ER 5 MG PO TB24: 5.0000 mg | ORAL_TABLET | Freq: Every day | ORAL | 3 refills | Status: DC

## 2024-06-19 ENCOUNTER — Ambulatory Visit (INDEPENDENT_AMBULATORY_CARE_PROVIDER_SITE_OTHER): Admitting: Internal Medicine

## 2024-06-19 ENCOUNTER — Telehealth: Payer: Self-pay

## 2024-06-19 ENCOUNTER — Encounter: Payer: Self-pay | Admitting: Internal Medicine

## 2024-06-19 ENCOUNTER — Encounter: Payer: Self-pay | Admitting: Nurse Practitioner

## 2024-06-19 ENCOUNTER — Other Ambulatory Visit (HOSPITAL_COMMUNITY): Payer: Self-pay

## 2024-06-19 VITALS — BP 100/62 | HR 58 | Temp 97.9°F | Ht 74.0 in | Wt 262.4 lb

## 2024-06-19 DIAGNOSIS — E538 Deficiency of other specified B group vitamins: Secondary | ICD-10-CM

## 2024-06-19 DIAGNOSIS — I7121 Aneurysm of the ascending aorta, without rupture: Secondary | ICD-10-CM

## 2024-06-19 DIAGNOSIS — M19042 Primary osteoarthritis, left hand: Secondary | ICD-10-CM | POA: Diagnosis not present

## 2024-06-19 DIAGNOSIS — Z0001 Encounter for general adult medical examination with abnormal findings: Secondary | ICD-10-CM

## 2024-06-19 DIAGNOSIS — E1169 Type 2 diabetes mellitus with other specified complication: Secondary | ICD-10-CM

## 2024-06-19 DIAGNOSIS — Z125 Encounter for screening for malignant neoplasm of prostate: Secondary | ICD-10-CM | POA: Diagnosis not present

## 2024-06-19 DIAGNOSIS — E669 Obesity, unspecified: Secondary | ICD-10-CM | POA: Diagnosis not present

## 2024-06-19 DIAGNOSIS — E559 Vitamin D deficiency, unspecified: Secondary | ICD-10-CM | POA: Diagnosis not present

## 2024-06-19 DIAGNOSIS — E782 Mixed hyperlipidemia: Secondary | ICD-10-CM | POA: Diagnosis not present

## 2024-06-19 DIAGNOSIS — I10 Essential (primary) hypertension: Secondary | ICD-10-CM

## 2024-06-19 DIAGNOSIS — M19041 Primary osteoarthritis, right hand: Secondary | ICD-10-CM | POA: Diagnosis not present

## 2024-06-19 DIAGNOSIS — Z7985 Long-term (current) use of injectable non-insulin antidiabetic drugs: Secondary | ICD-10-CM

## 2024-06-19 DIAGNOSIS — Z7984 Long term (current) use of oral hypoglycemic drugs: Secondary | ICD-10-CM | POA: Diagnosis not present

## 2024-06-19 DIAGNOSIS — Z0000 Encounter for general adult medical examination without abnormal findings: Secondary | ICD-10-CM

## 2024-06-19 MED ORDER — GLIPIZIDE ER 2.5 MG PO TB24: 2.5000 mg | ORAL_TABLET | Freq: Every day | ORAL | 3 refills | Status: AC

## 2024-06-19 MED ORDER — TIRZEPATIDE 2.5 MG/0.5ML ~~LOC~~ SOAJ
2.5000 mg | SUBCUTANEOUS | 11 refills | Status: DC
Start: 2024-06-19 — End: 2024-09-03

## 2024-06-19 NOTE — Telephone Encounter (Signed)
 Pharmacy Patient Advocate Encounter   Received notification from CoverMyMeds that prior authorization for Mounjaro  2.5MG /0.5ML auto-injectors  is required/requested.   Insurance verification completed.   The patient is insured through [facility] ADVANTAGE/RX ADVANCE .   Per test claim: PA required; PA started via CoverMyMeds. KEY [id number] . Waiting for clinical questions to populate.

## 2024-06-19 NOTE — Progress Notes (Unsigned)
 Patient ID: [name], male   DOB: [birth year], 68 y.o.   MRN: [id number]         Chief Complaint:: wellness exam and Medical Management of Chronic Issues (4 Month follow up, diabetes management. Patient notes glucose levels have been more or less in the 110s peaking at 150 at times. Labs as recently as yesterday. Cardiologist recommending lisinopril  but wanted patient to talk with PCP prior) and Hand Pain (Bilateral arthritic hand pain. Present in both hands but worsening in left. )  , obesity, DM, ascending aortic aneurysm, low vit d       HPI:  [name] is a 68 y.o. male here for wellness exam; up to date                        Also needs yearly CTA chest aorta, last showed 41 mm.  Peak wt has been close to 400 lbs in past, now much improved but plateued and needs further wt loss.  Pt denies chest pain, increased sob or doe, wheezing, orthopnea, PND, increased LE swelling, palpitations, dizziness or syncope.   Pt denies polydipsia, polyuria, or new focal neuro s/s.    Pt denies fever, night sweats, loss of appetite, or other constitutional symptoms    Wt Readings from Last 3 Encounters:  06/19/24 262 lb 6.4 oz (119 kg)  06/06/24 260 lb (117.9 kg)  04/09/24 260 lb (117.9 kg)   BP Readings from Last 3 Encounters:  06/19/24 100/62  04/09/24 112/70  03/07/24 116/70   Immunization History  Administered Date(s) Administered   Fluad Quad(high Dose 65+) 10/05/2019, 10/07/2020, 09/14/2021, 08/30/2022   Fluad Trivalent(High Dose 65+) 09/13/2023   Influenza Split 08/19/2011, 10/30/2012, 09/06/2016   Influenza Whole 09/21/2005, 09/02/2008, 12/24/2009   Influenza,inj,Quad PF,6+ Mos 09/13/2013, 09/30/2014, 10/29/2015, 11/15/2016, 09/23/2017, 09/19/2018   PFIZER Comirnaty(Gray Top)Covid-19 Tri-Sucrose Vaccine 07/09/2021, 08/30/2022   PFIZER(Purple Top)SARS-COV-2 Vaccination 01/03/2020, 01/27/2020, 10/31/2020   PNEUMOCOCCAL CONJUGATE-20 11/24/2023   Pfizer(Comirnaty)Fall Seasonal Vaccine  12 years and older 05/09/2023   Pneumococcal Conjugate-13 03/13/2014   Pneumococcal Polysaccharide-23 03/04/2008, 06/28/2016, 11/28/2018   Td 03/11/2009   Td (Adult), 2 Lf Tetanus Toxid, Preservative Free 03/11/2009   Tdap 07/13/2019   Zoster Recombinant(Shingrix) 08/30/2022, 05/09/2023   Zoster, Live 10/29/2015   There are no preventive care reminders to display for this patient.     Past Medical History:  Diagnosis Date   Arthritis    BACK PAIN    CAD (coronary artery disease) 06/28/2017   CAD in native artery    a. moderate by cardiac CT 2016.   Cancer [facility])    Chronic bilateral low back pain with bilateral sciatica 01/12/2019   Degenerative arthritis of right knee 07/15/2016   Injected in 07/15/2016 Orthovisc injection started 10/07/2016 DepoMedrol 40 inj on 04/11/17, 25 cc aspirated Orthovisc injection in 05/30/2017     DEGENERATIVE JOINT DISEASE, KNEES, BILATERAL    Depression    Diabetes (HCC) 10/01/2014   Diabetes mellitus    type II   DIVERTICULOSIS, COLON    DVT, lower extremity (HCC)    right   GERD (gastroesophageal reflux disease)    History of kidney stones    Hyperlipidemia    Hypertension    HYPOGONADISM, MALE    HYPOTENSION, ORTHOSTATIC    Kidney stones    Long term (current) use of anticoagulants 08/17/2017   Long term current use of anticoagulant    Lumbosacral radiculopathy at S1 06/14/2012   Maxillary sinus  cyst 12/11/2018   Obesity    OBESITY, MORBID 03/04/2008   Qualifier: Diagnosis of  By: Norleen MD, Lynwood ORN    OSA (obstructive sleep apnea) 09/11/2013   Pulmonary emboli (HCC)    Pulmonary embolism (HCC) 02/05/2011   RBBB 07/28/2017   Renal insufficiency    a. prior h/o, does not appear chronic.   Superficial phlebitis of arm 03/18/2016   Vitamin D  deficiency 07/09/2020   Past Surgical History:  Procedure Laterality Date   ankle surgury  2001 bilateral   with bone spurs and torn tendon   BIOPSY  01/09/2024   Procedure: BIOPSY;  Surgeon:  Suzann Inocente HERO, MD;  Location: [facility] ENDOSCOPY;  Service: Gastroenterology;;   COLONOSCOPY  03/18/2010   EDG  2008   chronic Duodenitis   ESOPHAGOGASTRODUODENOSCOPY (EGD) WITH PROPOFOL  N/A 01/09/2024   Procedure: ESOPHAGOGASTRODUODENOSCOPY (EGD) WITH PROPOFOL ;  Surgeon: Suzann Inocente HERO, MD;  Location: [facility] ENDOSCOPY;  Service: Gastroenterology;  Laterality: N/A;   FLEXIBLE SIGMOIDOSCOPY N/A 11/03/2021   Procedure: FLEXIBLE SIGMOIDOSCOPY;  Surgeon: Legrand Victory LITTIE DOUGLAS, MD;  Location: [facility] ENDOSCOPY;  Service: Gastroenterology;  Laterality: N/A;   GASTRIC BYPASS     HEEL SPUR SURGERY Bilateral 2005   IR ANGIOGRAM PELVIS SELECTIVE OR SUPRASELECTIVE  11/02/2021   IR ANGIOGRAM PELVIS SELECTIVE OR SUPRASELECTIVE  11/04/2021   IR ANGIOGRAM SELECTIVE EACH ADDITIONAL VESSEL  11/02/2021   IR ANGIOGRAM SELECTIVE EACH ADDITIONAL VESSEL  11/02/2021   IR ANGIOGRAM SELECTIVE EACH ADDITIONAL VESSEL  11/02/2021   IR ANGIOGRAM SELECTIVE EACH ADDITIONAL VESSEL  11/04/2021   IR ANGIOGRAM SELECTIVE EACH ADDITIONAL VESSEL  11/04/2021   IR ANGIOGRAM SELECTIVE EACH ADDITIONAL VESSEL  11/04/2021   IR ANGIOGRAM SELECTIVE EACH ADDITIONAL VESSEL  11/04/2021   IR ANGIOGRAM VISCERAL SELECTIVE  11/02/2021   IR ANGIOGRAM VISCERAL SELECTIVE  11/04/2021   IR EMBO ART  VEN HEMORR LYMPH EXTRAV  INC GUIDE ROADMAPPING  11/02/2021   IR EMBO ART  VEN HEMORR LYMPH EXTRAV  INC GUIDE ROADMAPPING  11/04/2021   IR EMBO ART  VEN HEMORR LYMPH EXTRAV  INC GUIDE ROADMAPPING  11/04/2021   IR RADIOLOGIST EVAL & MGMT  06/01/2023   IR US  GUIDE VASC ACCESS RIGHT  11/02/2021   IR US  GUIDE VASC ACCESS RIGHT  11/04/2021   knee surgury  2000 & 2003    cartilage damage   LEFT HEART CATH AND CORONARY ANGIOGRAPHY N/A 02/16/2021   Procedure: LEFT HEART CATH AND CORONARY ANGIOGRAPHY;  Surgeon: Wonda Sharper, MD;  Location: [facility] INVASIVE CV LAB;  Service: Cardiovascular;  Laterality: N/A;   LUMBAR LAMINECTOMY/DECOMPRESSION MICRODISCECTOMY  09/28/2012    Procedure: LUMBAR LAMINECTOMY/DECOMPRESSION MICRODISCECTOMY 2 LEVELS;  Surgeon: Lynwood SHAUNNA Bern, MD;  Location: [facility] ORS;  Service: Orthopedics;  Laterality: Left;  Decompressive laminectomy L4-L5. Microdiscectomy L5-S1   Skin removal surgery     extra abdominal skin removed from his weight loss   TONSILLECTOMY AND ADENOIDECTOMY  age 87 or 6   TOTAL KNEE ARTHROPLASTY  01/10/2012   Procedure: TOTAL KNEE ARTHROPLASTY;  Surgeon: Donnice JONETTA Car, MD;  Location: [facility] ORS;  Service: Orthopedics;  Laterality: Left;   TOTAL KNEE ARTHROPLASTY Right 11/18/2020   Procedure: TOTAL KNEE ARTHROPLASTY;  Surgeon: Car Donnice, MD;  Location: [facility] ORS;  Service: Orthopedics;  Laterality: Right;  70 mins   UPPER GASTROINTESTINAL ENDOSCOPY  03/18/2010    reports that he quit smoking about 45 years ago. His smoking use included cigarettes. He started smoking about 50 years ago. He has a 1.3 pack-year smoking  history. He has never used smokeless tobacco. He reports current alcohol  use of about 2.0 standard drinks of alcohol  per week. He reports that he does not use drugs. family history includes Diabetes in his brother, sister, sister, and sister; Heart failure in his sister; Hypertension in his mother; Prostate cancer in his father. Allergies  Allergen Reactions   Other Other (See Comments)    NO BLOOD -JEHOVAH'S WITNESS-SIGNED REFUSAL BUT WOULD TAKE ALBUMIN    Adhesive [Tape] Other (See Comments)    Painful, Please use paper tape   Current Outpatient Medications on File Prior to Visit  Medication Sig Dispense Refill   Artificial Tear Ointment (ARTIFICIAL TEARS) ointment Place 1 drop into both eyes every 4 (four) hours as needed (dry eyes).      atorvastatin  (LIPITOR) 80 MG tablet TAKE 1 TABLET BY MOUTH EVERY DAY 90 tablet 3   chlorthalidone  (HYGROTON ) 25 MG tablet Take 1 tablet (25 mg total) by mouth daily. 90 tablet 3   Cholecalciferol (VITAMIN D3) 125 MCG (5000 UT) CAPS Take 5,000 Units by mouth daily.       ferrous sulfate  325 (65 FE) MG EC tablet TAKE 1 TABLET BY MOUTH EVERY DAY 90 tablet 1   folic acid  (FOLVITE ) 1 MG tablet TAKE 1 TABLET BY MOUTH EVERY DAY 90 tablet 1   metFORMIN  (GLUCOPHAGE -XR) 500 MG 24 hr tablet Take 4 tablets (2,000 mg total) by mouth daily with breakfast. 360 tablet 3   Multiple Vitamin (MULTIVITAMIN WITH MINERALS) TABS tablet Take 1 tablet by mouth daily at 12 noon.     nitroGLYCERIN  (NITROSTAT ) 0.4 MG SL tablet Place 1 tablet (0.4 mg total) under the tongue every 5 (five) minutes as needed for chest pain. 90 tablet 3   omeprazole  (PRILOSEC) 40 MG capsule Take 1 capsule (40 mg total) by mouth 2 (two) times daily. 90 capsule 3   ONETOUCH ULTRA TEST test strip USE AS INSTRUCTED 100 strip 12   vitamin B-12 1000 MCG tablet Take 1 tablet (1,000 mcg total) by mouth daily. 30 tablet 1   No current facility-administered medications on file prior to visit.        ROS:  All others reviewed and negative.  Objective        PE:  BP 100/62   Pulse (!) 58   Temp 97.9 F (36.6 C)   Ht 6' 2 (1.88 m)   Wt 262 lb 6.4 oz (119 kg)   SpO2 98%   BMI 33.69 kg/m                 Constitutional: Pt appears in NAD               HENT: Head: NCAT.                Right Ear: External ear normal.                 Left Ear: External ear normal.                Eyes: . Pupils are equal, round, and reactive to light. Conjunctivae and EOM are normal               Nose: without d/c or deformity               Neck: Neck supple. Gross normal ROM               Cardiovascular: Normal rate and regular rhythm.  Pulmonary/Chest: Effort normal and breath sounds without rales or wheezing.                Abd:  Soft, NT, ND, + BS, no organomegaly               Neurological: Pt is alert. At baseline orientation, motor grossly intact               Skin: Skin is warm. No rashes, no other new lesions, LE edema - none               Psychiatric: Pt behavior is normal without agitation   Micro:  none  Cardiac tracings I have personally interpreted today:  none  Pertinent Radiological findings (summarize): none   Lab Results  Component Value Date   WBC 5.8 06/18/2024   HGB 12.6 (L) 06/18/2024   HCT 40.6 06/18/2024   PLT 157.0 06/18/2024   GLUCOSE 121 (H) 06/18/2024   CHOL 117 06/18/2024   TRIG 65.0 06/18/2024   HDL 46.30 06/18/2024   LDLDIRECT 116.0 01/15/2020   LDLCALC 57 06/18/2024   ALT 41 06/18/2024   AST 23 06/18/2024   NA 138 06/18/2024   K 4.3 06/18/2024   CL 103 06/18/2024   CREATININE 1.27 06/18/2024   BUN 25 (H) 06/18/2024   CO2 26 06/18/2024   TSH 2.76 06/18/2024   PSA 0.46 06/18/2024   INR 1.2 (H) 06/22/2023   HGBA1C 7.5 (H) 06/18/2024   MICROALBUR <0.7 06/18/2024   Assessment/Plan:  [name] is a 68 y.o. Black or African American [2] male with  has a past medical history of Arthritis, BACK PAIN, CAD (coronary artery disease) (06/28/2017), CAD in native artery, Cancer (HCC), Chronic bilateral low back pain with bilateral sciatica (01/12/2019), Degenerative arthritis of right knee (07/15/2016), DEGENERATIVE JOINT DISEASE, KNEES, BILATERAL, Depression, Diabetes (HCC) (10/01/2014), Diabetes mellitus, DIVERTICULOSIS, COLON, DVT, lower extremity (HCC), GERD (gastroesophageal reflux disease), History of kidney stones, Hyperlipidemia, Hypertension, HYPOGONADISM, MALE, HYPOTENSION, ORTHOSTATIC, Kidney stones, Long term (current) use of anticoagulants (08/17/2017), Long term current use of anticoagulant, Lumbosacral radiculopathy at S1 (06/14/2012), Maxillary sinus cyst (12/11/2018), Obesity, OBESITY, MORBID (03/04/2008), OSA (obstructive sleep apnea) (09/11/2013), Pulmonary emboli (HCC), Pulmonary embolism (HCC) (02/05/2011), RBBB (07/28/2017), Renal insufficiency, Superficial phlebitis of arm (03/18/2016), and Vitamin D  deficiency (07/09/2020).  Encounter for well adult exam with abnormal findings Age and sex appropriate education and counseling updated with  regular exercise and diet Referrals for preventative services - none needed Immunizations addressed - none needed Smoking counseling  - none needed Evidence for depression or other mood disorder - none significant Most recent labs reviewed. I have personally reviewed and have noted: 1) the patient's medical and social history 2) The patient's current medications and supplements 3) The patient's height, weight, and BMI have been recorded in the chart   Vitamin D  deficiency Last vitamin D  Lab Results  Component Value Date   VD25OH 44.13 06/18/2024   Stable, cont oral replacement   Type 2 diabetes mellitus with obesity (HCC) Lab Results  Component Value Date   HGBA1C 7.5 (H) 06/18/2024   Uncontrolled and need for further wt loss -  pt to decreased the glipizide  XL to 2.5 mg daily, and start mounjaro  2.5 mg weekly with intent to titrate; also continue metformin  ER 500 mg - 4 qd   Hyperlipidemia Lab Results  Component Value Date   LDLCALC 57 06/18/2024   Stable, pt to continue current statin lipitor 80 mg qd   Essential hypertension  BP Readings from Last 3 Encounters:  06/19/24 100/62  04/09/24 112/70  03/07/24 116/70   Stable, pt to continue medical treatment hygroten 25 mg every day, but avoid ACE for now given low normal pressure   Aneurysm of ascending aorta without rupture (HCC) Pt due for f/u CTA chest aorta w wo CM  Hand arthritis Pt ok for OTC voltaren  gel prn  Followup: Return in about 6 months (around 12/20/2024).  Lynwood Rush, MD 06/22/2024 8:13 PM [facility]

## 2024-06-19 NOTE — Patient Instructions (Signed)
 Please take all new medication as prescribed - the mounjaro  2.5 mg weekly, and let us  know in Mychart if you are doing ok in 1 month so we can increase the dose  Please continue all other medications as before, and refills have been done if requested, including keeping the glipizide  XL 2.5 mg dosing as well.  Please have the pharmacy call with any other refills you may need.  Please continue your efforts at being more active, low cholesterol diet, and weight control.  You are otherwise up to date with prevention measures today.  Please keep your appointments with your specialists as you may have planned  You will be contacted regarding the referral for: CTA chest to check the ascending aortic dilation  Please make an Appointment to return in 6 months, or sooner if needed, also with Lab Appointment for testing done 3-5 days before at the FIRST FLOOR Lab (so this is for TWO appointments - please see the scheduling desk as you leave)

## 2024-06-20 NOTE — Telephone Encounter (Signed)
 Pharmacy Patient Advocate Encounter  Received notification from [facility] [employer] that Prior Authorization for Mounjaro  2.5MG /0.5ML auto-injectors has been APPROVED from 06/20/2024 to 06/20/2025

## 2024-06-20 NOTE — Telephone Encounter (Signed)
 PLEASE BE ADVISED Clinical questions have been answered and PA submitted.TO PLAN. PA currently Pending.

## 2024-06-22 ENCOUNTER — Encounter: Payer: Self-pay | Admitting: Internal Medicine

## 2024-06-22 DIAGNOSIS — M19049 Primary osteoarthritis, unspecified hand: Secondary | ICD-10-CM | POA: Insufficient documentation

## 2024-06-22 DIAGNOSIS — I7121 Aneurysm of the ascending aorta, without rupture: Secondary | ICD-10-CM | POA: Insufficient documentation

## 2024-06-22 NOTE — Assessment & Plan Note (Signed)
 BP Readings from Last 3 Encounters:  06/19/24 100/62  04/09/24 112/70  03/07/24 116/70   Stable, pt to continue medical treatment hygroten 25 mg every day, but avoid ACE for now given low normal pressure

## 2024-06-22 NOTE — Assessment & Plan Note (Signed)
 Lab Results  Component Value Date   LDLCALC 57 06/18/2024   Stable, pt to continue current statin lipitor 80 mg qd

## 2024-06-22 NOTE — Assessment & Plan Note (Signed)
 Lab Results  Component Value Date   HGBA1C 7.5 (H) 06/18/2024   Uncontrolled and need for further wt loss -  pt to decreased the glipizide  XL to 2.5 mg daily, and start mounjaro  2.5 mg weekly with intent to titrate; also continue metformin  ER 500 mg - 4 qd

## 2024-06-22 NOTE — Assessment & Plan Note (Signed)
 Pt due for f/u CTA chest aorta w wo CM

## 2024-06-22 NOTE — Assessment & Plan Note (Signed)
 Last vitamin D  Lab Results  Component Value Date   VD25OH 44.13 06/18/2024   Stable, cont oral replacement

## 2024-06-22 NOTE — Assessment & Plan Note (Signed)
 Pt ok for OTC voltaren  gel prn

## 2024-06-22 NOTE — Assessment & Plan Note (Signed)
 Age and sex appropriate education and counseling updated with regular exercise and diet Referrals for preventative services - none needed Immunizations addressed - none needed Smoking counseling  - none needed Evidence for depression or other mood disorder - none significant Most recent labs reviewed. I have personally reviewed and have noted: 1) the patient's medical and social history 2) The patient's current medications and supplements 3) The patient's height, weight, and BMI have been recorded in the chart

## 2024-06-25 ENCOUNTER — Inpatient Hospital Stay: Admission: RE | Admit: 2024-06-25 | Source: Ambulatory Visit

## 2024-06-27 ENCOUNTER — Ambulatory Visit
Admission: RE | Admit: 2024-06-27 | Discharge: 2024-06-27 | Disposition: A | Discharge status: Home / Self Care | Source: Ambulatory Visit | Attending: Internal Medicine | Admitting: Internal Medicine

## 2024-06-27 DIAGNOSIS — I7121 Aneurysm of the ascending aorta, without rupture: Secondary | ICD-10-CM

## 2024-06-27 MED ORDER — IOPAMIDOL (ISOVUE-370) INJECTION 76%
75.0000 mL | Freq: Once | INTRAVENOUS | Status: AC | PRN
  Administered 2024-06-27: 75 mL via INTRAVENOUS

## 2024-07-02 ENCOUNTER — Ambulatory Visit: Payer: Self-pay | Admitting: Internal Medicine

## 2024-07-23 ENCOUNTER — Other Ambulatory Visit: Payer: Self-pay | Admitting: Internal Medicine

## 2024-08-10 ENCOUNTER — Other Ambulatory Visit: Payer: Self-pay | Admitting: Internal Medicine

## 2024-08-29 ENCOUNTER — Encounter: Payer: Self-pay | Admitting: Internal Medicine

## 2024-09-03 MED ORDER — TIRZEPATIDE 5 MG/0.5ML ~~LOC~~ SOAJ: 5.0000 mg | SUBCUTANEOUS | 3 refills | Status: DC

## 2024-09-24 ENCOUNTER — Encounter: Admitting: Internal Medicine

## 2024-09-26 ENCOUNTER — Telehealth: Admitting: Physician Assistant

## 2024-09-26 DIAGNOSIS — B9689 Other specified bacterial agents as the cause of diseases classified elsewhere: Secondary | ICD-10-CM | POA: Diagnosis not present

## 2024-09-26 DIAGNOSIS — J019 Acute sinusitis, unspecified: Secondary | ICD-10-CM

## 2024-09-26 MED ORDER — DOXYCYCLINE HYCLATE 100 MG PO TABS: 100.0000 mg | ORAL_TABLET | Freq: Two times a day (BID) | ORAL | 0 refills | Status: AC

## 2024-09-26 NOTE — Progress Notes (Signed)
 Virtual Visit Consent   [name], you are scheduled for a virtual visit with a [facility] provider today. Just as with appointments in the office, your consent must be obtained to participate. Your consent will be active for this visit and any virtual visit you may have with one of our providers in the next 365 days. If you have a MyChart account, a copy of this consent can be sent to you electronically.  As this is a virtual visit, video technology does not allow for your provider to perform a traditional examination. This may limit your provider's ability to fully assess your condition. If your provider identifies any concerns that need to be evaluated in person or the need to arrange testing (such as labs, EKG, etc.), we will make arrangements to do so. Although advances in technology are sophisticated, we cannot ensure that it will always work on either your end or our end. If the connection with a video visit is poor, the visit may have to be switched to a telephone visit. With either a video or telephone visit, we are not always able to ensure that we have a secure connection.  By engaging in this virtual visit, you consent to the provision of healthcare and authorize for your insurance to be billed (if applicable) for the services provided during this visit. Depending on your insurance coverage, you may receive a charge related to this service.  I need to obtain your verbal consent now. Are you willing to proceed with your visit today? [name] has provided verbal consent on 09/26/2024 for a virtual visit (video or telephone). [name], [place]  Date: 09/26/2024 2:04 PM   Virtual Visit via Video Note   I, [name], connected with  [name]  ([id number], Apr 08, 1956) on 09/26/24 at  2:00 PM EDT by a video-enabled telemedicine application and verified that I am speaking with the correct person using two identifiers.  Location: Patient: Virtual Visit  Location Patient: Home Provider: Virtual Visit Location Provider: Home Office   I discussed the limitations of evaluation and management by telemedicine and the availability of in person appointments. The patient expressed understanding and agreed to proceed.    History of Present Illness: [name] is a 68 y.o. who identifies as a male who was assigned male at birth, and is being seen today for URI symptoms for the past > 1 week after being outside in the rain. Noted initially with scratchy throat and nasal congestion, thinking was related to being out in weather. Continued to progress since onset, now with sinus pressure and sinus pain. Denies fever, chills, aches. Took a home COVID test which was negative.   HPI: HPI  Problems:  Patient Active Problem List   Diagnosis Date Noted   Aneurysm of ascending aorta without rupture 06/22/2024   Hand arthritis 06/22/2024   Intractable nausea and vomiting 01/08/2024   Intractable vomiting 01/07/2024   Acquired trigger finger of left ring finger 12/25/2023   Finger pain, left 11/25/2023   Diarrhea 09/15/2023   Allergic rhinitis 06/27/2023   Complication associated with orthopedic device 04/01/2023   Cough 04/20/2022   Encounter for well adult exam with abnormal findings 03/29/2022   External hemorrhoid, thrombosed 03/29/2022   Iron deficiency anemia due to chronic blood loss 12/27/2021   Malignant neoplasm of prostate (HCC) 11/15/2021   Refusal of blood transfusions as patient is Jehovah's Witness 11/04/2021   Abnormal CT of the abdomen  Chest pain 11/03/2021   Hematochezia    GIB (gastrointestinal bleeding) 11/02/2021   CKD (chronic kidney disease) stage 3, GFR 30-59 ml/min (HCC) 03/14/2021   Elevated PSA 03/13/2021   B12 deficiency 03/13/2021   Acute blood loss anemia 12/14/2020   Osteoarthritis of right knee 11/18/2020   S/P right TKA 11/18/2020   Gross hematuria 09/28/2020   Left flank pain 09/23/2020   Dyspepsia  07/09/2020   Left groin pain 07/09/2020   Vitamin D  deficiency 07/09/2020   Chronic low back pain 01/12/2019   Headache 12/11/2018   Nosebleed 12/11/2018   Maxillary sinus cyst 12/11/2018   Left leg pain 12/11/2018   Acute tubular injury of transplanted kidney 12/11/2018   Lower limb pain, inferior, left 12/11/2018   Left hip pain 12/01/2018   Hip pain 12/01/2018   Acute upper respiratory infection 08/17/2018   Increased prostate specific antigen (PSA) velocity 07/12/2018   Pain in lower jaw 03/23/2018   Pain of left heel 12/21/2017   RBBB 07/28/2017   CAD (coronary artery disease) 06/28/2017   Coronary arteriosclerosis 06/28/2017   Degenerative arthritis of right knee 07/15/2016   Bursitis of shoulder 07/15/2016   Right shoulder pain 06/24/2016   Right knee pain 06/24/2016   Superficial phlebitis of arm 03/18/2016   Abdominal pain 12/23/2015   Rash 10/29/2015   Eruption cyst 10/29/2015   Panniculitis affecting back 09/30/2015   Chest pain at rest 03/27/2015   Dysuria 03/27/2015   Diabetes (HCC) 10/01/2014   S/P gastric bypass 08/07/2014   Supratherapeutic INR 08/07/2014   No blood products 07/17/2014   Nipple pain 03/13/2014   Breast mass in male 03/13/2014   Pain of breast 03/13/2014   Breast lump 03/13/2014   Encounter for therapeutic drug monitoring 01/25/2014   Esophageal reflux 12/19/2013   Type 2 diabetes mellitus with obesity 12/19/2013   Peripheral edema 10/10/2013   OSA (obstructive sleep apnea) 09/11/2013   Lesion of penis 01/01/2013   Lumbosacral radiculopathy at S1 06/14/2012   S/P left knee replacement 01/10/2012   Preventative health care 12/16/2011   Back pain with radiation 09/18/2011   Backache with radiation 09/18/2011   Long term current use of anticoagulant 04/08/2011   HYPOTENSION, ORTHOSTATIC 01/29/2010   CONSTIPATION 01/21/2010   DEGENERATIVE JOINT DISEASE, KNEES, BILATERAL 11/17/2009   SMOKER 09/12/2009   HYPOGONADISM, MALE 03/04/2008    OBESITY, MORBID 03/04/2008   Depression 10/05/2007   DIVERTICULOSIS, COLON 10/05/2007   History of colonic polyps 10/05/2007   Hyperlipidemia 06/30/2007   Essential hypertension 06/30/2007   History of pulmonary embolus (PE) 06/13/2006    Allergies:  Allergies  Allergen Reactions   Other Other (See Comments)    NO BLOOD -JEHOVAH'S WITNESS-SIGNED REFUSAL BUT WOULD TAKE ALBUMIN    Adhesive [Tape] Other (See Comments)    Painful, Please use paper tape   Medications:  Current Outpatient Medications:    doxycycline  (VIBRA -TABS) 100 MG tablet, Take 1 tablet (100 mg total) by mouth 2 (two) times daily., Disp: 14 tablet, Rfl: 0   Artificial Tear Ointment (ARTIFICIAL TEARS) ointment, Place 1 drop into both eyes every 4 (four) hours as needed (dry eyes). , Disp: , Rfl:    atorvastatin  (LIPITOR) 80 MG tablet, TAKE 1 TABLET BY MOUTH EVERY DAY, Disp: 90 tablet, Rfl: 3   chlorthalidone  (HYGROTON ) 25 MG tablet, TAKE 1 TABLET (25 MG TOTAL) BY MOUTH DAILY., Disp: 90 tablet, Rfl: 3   Cholecalciferol (VITAMIN D3) 125 MCG (5000 UT) CAPS, Take 5,000 Units by mouth daily. , Disp: ,  Rfl:    ferrous sulfate  325 (65 FE) MG EC tablet, TAKE 1 TABLET BY MOUTH EVERY DAY, Disp: 90 tablet, Rfl: 1   folic acid  (FOLVITE ) 1 MG tablet, TAKE 1 TABLET BY MOUTH EVERY DAY, Disp: 90 tablet, Rfl: 1   glipiZIDE  (GLUCOTROL  XL) 2.5 MG 24 hr tablet, Take 1 tablet (2.5 mg total) by mouth daily with breakfast., Disp: 90 tablet, Rfl: 3   metFORMIN  (GLUCOPHAGE -XR) 500 MG 24 hr tablet, TAKE 4 TABLETS BY MOUTH DAILY WITH BREAKFAST, Disp: 360 tablet, Rfl: 3   Multiple Vitamin (MULTIVITAMIN WITH MINERALS) TABS tablet, Take 1 tablet by mouth daily at 12 noon., Disp: , Rfl:    nitroGLYCERIN  (NITROSTAT ) 0.4 MG SL tablet, Place 1 tablet (0.4 mg total) under the tongue every 5 (five) minutes as needed for chest pain., Disp: 90 tablet, Rfl: 3   omeprazole  (PRILOSEC) 40 MG capsule, Take 1 capsule (40 mg total) by mouth 2 (two) times daily.,  Disp: 90 capsule, Rfl: 3   ONETOUCH ULTRA TEST test strip, USE AS INSTRUCTED, Disp: 100 strip, Rfl: 12   tirzepatide  (MOUNJARO ) 5 MG/0.5ML Pen, Inject 5 mg into the skin once a week., Disp: 6 mL, Rfl: 3   vitamin B-12 1000 MCG tablet, Take 1 tablet (1,000 mcg total) by mouth daily., Disp: 30 tablet, Rfl: 1  Observations/Objective: Patient is well-developed, well-nourished in no acute distress.  Resting comfortably at home.  Head is normocephalic, atraumatic.  No labored breathing. Speech is clear and coherent with logical content.  Patient is alert and oriented at baseline.   Assessment and Plan: 1. Acute bacterial sinusitis (Primary) - doxycycline  (VIBRA -TABS) 100 MG tablet; Take 1 tablet (100 mg total) by mouth 2 (two) times daily.  Dispense: 14 tablet; Refill: 0  Rx Doxycycline .  Increase fluids.  Rest.  Saline nasal spray.  Probiotic.  Mucinex  as directed.  Humidifier in bedroom.  Call or return to clinic if symptoms are not improving.   Follow Up Instructions: I discussed the assessment and treatment plan with the patient. The patient was provided an opportunity to ask questions and all were answered. The patient agreed with the plan and demonstrated an understanding of the instructions.  A copy of instructions were sent to the patient via MyChart unless otherwise noted below.   The patient was advised to call back or seek an in-person evaluation if the symptoms worsen or if the condition fails to improve as anticipated.    [name] Velma Lunger, PA-C

## 2024-09-26 NOTE — Patient Instructions (Signed)
 [name], thank you for joining [name] Velma Lunger, PA-C for today's virtual visit.  While this provider is not your primary care provider (PCP), if your PCP is located in our provider database this encounter information will be shared with them immediately following your visit.   A [facility] MyChart account gives you access to today's visit and all your visits, tests, and labs performed at [facility]  click here if you don't have a [facility] MyChart account or go to [email or web address]  Consent: (Patient) [name] provided verbal consent for this virtual visit at the beginning of the encounter.  Current Medications:  Current Outpatient Medications:    Artificial Tear Ointment (ARTIFICIAL TEARS) ointment, Place 1 drop into both eyes every 4 (four) hours as needed (dry eyes). , Disp: , Rfl:    atorvastatin  (LIPITOR) 80 MG tablet, TAKE 1 TABLET BY MOUTH EVERY DAY, Disp: 90 tablet, Rfl: 3   chlorthalidone  (HYGROTON ) 25 MG tablet, TAKE 1 TABLET (25 MG TOTAL) BY MOUTH DAILY., Disp: 90 tablet, Rfl: 3   Cholecalciferol (VITAMIN D3) 125 MCG (5000 UT) CAPS, Take 5,000 Units by mouth daily. , Disp: , Rfl:    ferrous sulfate  325 (65 FE) MG EC tablet, TAKE 1 TABLET BY MOUTH EVERY DAY, Disp: 90 tablet, Rfl: 1   folic acid  (FOLVITE ) 1 MG tablet, TAKE 1 TABLET BY MOUTH EVERY DAY, Disp: 90 tablet, Rfl: 1   glipiZIDE  (GLUCOTROL  XL) 2.5 MG 24 hr tablet, Take 1 tablet (2.5 mg total) by mouth daily with breakfast., Disp: 90 tablet, Rfl: 3   metFORMIN  (GLUCOPHAGE -XR) 500 MG 24 hr tablet, TAKE 4 TABLETS BY MOUTH DAILY WITH BREAKFAST, Disp: 360 tablet, Rfl: 3   Multiple Vitamin (MULTIVITAMIN WITH MINERALS) TABS tablet, Take 1 tablet by mouth daily at 12 noon., Disp: , Rfl:    nitroGLYCERIN  (NITROSTAT ) 0.4 MG SL tablet, Place 1 tablet (0.4 mg total) under the tongue every 5 (five) minutes as needed for chest pain., Disp: 90 tablet, Rfl: 3   omeprazole  (PRILOSEC) 40 MG capsule,  Take 1 capsule (40 mg total) by mouth 2 (two) times daily., Disp: 90 capsule, Rfl: 3   ONETOUCH ULTRA TEST test strip, USE AS INSTRUCTED, Disp: 100 strip, Rfl: 12   tirzepatide  (MOUNJARO ) 5 MG/0.5ML Pen, Inject 5 mg into the skin once a week., Disp: 6 mL, Rfl: 3   vitamin B-12 1000 MCG tablet, Take 1 tablet (1,000 mcg total) by mouth daily., Disp: 30 tablet, Rfl: 1   Medications ordered in this encounter:  No orders of the defined types were placed in this encounter.    *If you need refills on other medications prior to your next appointment, please contact your pharmacy*  Follow-Up: Call back or seek an in-person evaluation if the symptoms worsen or if the condition fails to improve as anticipated.  [facility] [phone]  Other Instructions Please take antibiotic as directed.  Increase fluid intake.  Use Saline nasal spray.  Take a daily multivitamin. Ok to continue your OTC medications.  Place a humidifier in the bedroom.  If you note any non-resolving, new, or worsening symptoms despite treatment, please seek an in-person evaluation ASAP.   Sinusitis Sinusitis is redness, soreness, and swelling (inflammation) of the paranasal sinuses. Paranasal sinuses are air pockets within the bones of your face (beneath the eyes, the middle of the forehead, or above the eyes). In healthy paranasal sinuses, mucus is able to drain out, and air is able to circulate  through them by way of your nose. However, when your paranasal sinuses are inflamed, mucus and air can become trapped. This can allow bacteria and other germs to grow and cause infection. Sinusitis can develop quickly and last only a short time (acute) or continue over a long period (chronic). Sinusitis that lasts for more than 12 weeks is considered chronic.  CAUSES  Causes of sinusitis include: Allergies. Structural abnormalities, such as displacement of the cartilage that separates your nostrils (deviated septum), which  can decrease the air flow through your nose and sinuses and affect sinus drainage. Functional abnormalities, such as when the small hairs (cilia) that line your sinuses and help remove mucus do not work properly or are not present. SYMPTOMS  Symptoms of acute and chronic sinusitis are the same. The primary symptoms are pain and pressure around the affected sinuses. Other symptoms include: Upper toothache. Earache. Headache. Bad breath. Decreased sense of smell and taste. A cough, which worsens when you are lying flat. Fatigue. Fever. Thick drainage from your nose, which often is green and may contain pus (purulent). Swelling and warmth over the affected sinuses. DIAGNOSIS  Your caregiver will perform a physical exam. During the exam, your caregiver may: Look in your nose for signs of abnormal growths in your nostrils (nasal polyps). Tap over the affected sinus to check for signs of infection. View the inside of your sinuses (endoscopy) with a special imaging device with a light attached (endoscope), which is inserted into your sinuses. If your caregiver suspects that you have chronic sinusitis, one or more of the following tests may be recommended: Allergy tests. Nasal culture A sample of mucus is taken from your nose and sent to a lab and screened for bacteria. Nasal cytology A sample of mucus is taken from your nose and examined by your caregiver to determine if your sinusitis is related to an allergy. TREATMENT  Most cases of acute sinusitis are related to a viral infection and will resolve on their own within 10 days. Sometimes medicines are prescribed to help relieve symptoms (pain medicine, decongestants, nasal steroid sprays, or saline sprays).  However, for sinusitis related to a bacterial infection, your caregiver will prescribe antibiotic medicines. These are medicines that will help kill the bacteria causing the infection.  Rarely, sinusitis is caused by a fungal infection. In  theses cases, your caregiver will prescribe antifungal medicine. For some cases of chronic sinusitis, surgery is needed. Generally, these are cases in which sinusitis recurs more than 3 times per year, despite other treatments. HOME CARE INSTRUCTIONS  Drink plenty of water . Water  helps thin the mucus so your sinuses can drain more easily. Use a humidifier. Inhale steam 3 to 4 times a day (for example, sit in the bathroom with the shower running). Apply a warm, moist washcloth to your face 3 to 4 times a day, or as directed by your caregiver. Use saline nasal sprays to help moisten and clean your sinuses. Take over-the-counter or prescription medicines for pain, discomfort, or fever only as directed by your caregiver. SEEK IMMEDIATE MEDICAL CARE IF: You have increasing pain or severe headaches. You have nausea, vomiting, or drowsiness. You have swelling around your face. You have vision problems. You have a stiff neck. You have difficulty breathing. MAKE SURE YOU:  Understand these instructions. Will watch your condition. Will get help right away if you are not doing well or get worse. Document Released: 11/22/2005 Document Revised: 02/14/2012 Document Reviewed: 12/07/2011 [facility] Patient Information 2014 [place], [place].  If you have been instructed to have an in-person evaluation today at a local Urgent Care facility, please use the link below. It will take you to a list of all of our available [facility], including address, phone number and hours of operation. Please do not delay care.  [facility]  If you or a family member do not have a primary care provider, use the link below to schedule a visit and establish care. When you choose a [facility] primary care physician or advanced practice provider, you gain a long-term partner in health. Find a Primary Care Provider  Learn more about [facility]'s in-office and virtual care options: [facility] - Get  Care Now

## 2024-10-16 DIAGNOSIS — M545 Low back pain, unspecified: Secondary | ICD-10-CM | POA: Diagnosis not present

## 2024-11-12 DIAGNOSIS — Z8546 Personal history of malignant neoplasm of prostate: Secondary | ICD-10-CM | POA: Diagnosis not present

## 2024-11-14 DIAGNOSIS — M545 Low back pain, unspecified: Secondary | ICD-10-CM | POA: Diagnosis not present

## 2024-11-16 ENCOUNTER — Other Ambulatory Visit: Payer: Self-pay | Admitting: Hematology

## 2024-11-16 DIAGNOSIS — E538 Deficiency of other specified B group vitamins: Secondary | ICD-10-CM

## 2024-11-16 NOTE — Telephone Encounter (Signed)
 Dr. Lanny, He has not seen you since 10/31/2023. Your note indicates to follow up with PCP. Should PCP fill this instead? Andrea CHRISTELLA Plunk, RN

## 2024-11-19 ENCOUNTER — Encounter: Payer: Self-pay | Admitting: Nurse Practitioner

## 2024-11-20 ENCOUNTER — Telehealth: Payer: Self-pay

## 2024-11-20 NOTE — Telephone Encounter (Signed)
 Called patient as per Dr. Lanny, made him aware he no longer has to take the folic acid  so I will cancel the refill request that was sent from him pharmacy. Patient voiced full understanding and had no further questions at this time.

## 2024-11-22 ENCOUNTER — Encounter: Payer: Self-pay | Admitting: Internal Medicine

## 2024-11-22 ENCOUNTER — Ambulatory Visit: Admitting: Internal Medicine

## 2024-11-22 VITALS — BP 120/80 | HR 60 | Temp 98.3°F | Ht 74.0 in | Wt 230.0 lb

## 2024-11-22 DIAGNOSIS — I10 Essential (primary) hypertension: Secondary | ICD-10-CM | POA: Diagnosis not present

## 2024-11-22 DIAGNOSIS — Z8546 Personal history of malignant neoplasm of prostate: Secondary | ICD-10-CM | POA: Diagnosis not present

## 2024-11-22 DIAGNOSIS — N183 Chronic kidney disease, stage 3 unspecified: Secondary | ICD-10-CM

## 2024-11-22 DIAGNOSIS — E538 Deficiency of other specified B group vitamins: Secondary | ICD-10-CM

## 2024-11-22 DIAGNOSIS — E559 Vitamin D deficiency, unspecified: Secondary | ICD-10-CM

## 2024-11-22 DIAGNOSIS — C61 Malignant neoplasm of prostate: Secondary | ICD-10-CM

## 2024-11-22 DIAGNOSIS — E1122 Type 2 diabetes mellitus with diabetic chronic kidney disease: Secondary | ICD-10-CM | POA: Diagnosis not present

## 2024-11-22 DIAGNOSIS — Z7984 Long term (current) use of oral hypoglycemic drugs: Secondary | ICD-10-CM

## 2024-11-22 DIAGNOSIS — E782 Mixed hyperlipidemia: Secondary | ICD-10-CM | POA: Diagnosis not present

## 2024-11-22 LAB — HEPATIC FUNCTION PANEL
ALT: 23 U/L (ref 3–53)
AST: 20 U/L (ref 5–37)
Albumin: 4.8 g/dL (ref 3.5–5.2)
Alkaline Phosphatase: 72 U/L (ref 39–117)
Bilirubin, Direct: 0.2 mg/dL (ref 0.1–0.3)
Total Bilirubin: 1 mg/dL (ref 0.2–1.2)
Total Protein: 7.5 g/dL (ref 6.0–8.3)

## 2024-11-22 LAB — URINALYSIS, ROUTINE W REFLEX MICROSCOPIC
Bilirubin Urine: NEGATIVE
Hgb urine dipstick: NEGATIVE
Ketones, ur: NEGATIVE
Leukocytes,Ua: NEGATIVE
Nitrite: NEGATIVE
RBC / HPF: NONE SEEN (ref 0–?)
Specific Gravity, Urine: 1.02 (ref 1.000–1.030)
Total Protein, Urine: NEGATIVE
Urine Glucose: NEGATIVE
Urobilinogen, UA: 0.2 (ref 0.0–1.0)
pH: 6 (ref 5.0–8.0)

## 2024-11-22 LAB — MICROALBUMIN / CREATININE URINE RATIO
Creatinine,U: 132.9 mg/dL
Microalb Creat Ratio: 6 mg/g (ref 0.0–30.0)
Microalb, Ur: 0.8 mg/dL (ref 0.7–1.9)

## 2024-11-22 LAB — BASIC METABOLIC PANEL WITH GFR
BUN: 26 mg/dL — ABNORMAL HIGH (ref 6–23)
CO2: 22 meq/L (ref 19–32)
Calcium: 9.5 mg/dL (ref 8.4–10.5)
Chloride: 100 meq/L (ref 96–112)
Creatinine, Ser: 1.32 mg/dL (ref 0.40–1.50)
GFR: 54.49 mL/min — ABNORMAL LOW (ref >60.00)
Glucose, Bld: 74 mg/dL (ref 70–99)
Potassium: 3.6 meq/L (ref 3.5–5.1)
Sodium: 139 meq/L (ref 135–145)

## 2024-11-22 LAB — CBC WITH DIFFERENTIAL/PLATELET
Basophils Absolute: 0 K/uL (ref 0.0–0.1)
Basophils Relative: 0.4 % (ref 0.0–3.0)
Eosinophils Absolute: 0.1 K/uL (ref 0.0–0.7)
Eosinophils Relative: 1.2 % (ref 0.0–5.0)
HCT: 39.2 % (ref 39.0–52.0)
Hemoglobin: 12.1 g/dL — ABNORMAL LOW (ref 13.0–17.0)
Lymphocytes Relative: 30 % (ref 12.0–46.0)
Lymphs Abs: 1.4 K/uL (ref 0.7–4.0)
MCHC: 30.9 g/dL (ref 30.0–36.0)
MCV: 72.5 fl — ABNORMAL LOW (ref 78.0–100.0)
Monocytes Absolute: 0.3 K/uL (ref 0.1–1.0)
Monocytes Relative: 7.6 % (ref 3.0–12.0)
Neutro Abs: 2.8 K/uL (ref 1.4–7.7)
Neutrophils Relative %: 60.8 % (ref 43.0–77.0)
Platelets: 175 K/uL (ref 150.0–400.0)
RBC: 5.41 Mil/uL (ref 4.22–5.81)
RDW: 15.6 % — ABNORMAL HIGH (ref 11.5–15.5)
WBC: 4.6 K/uL (ref 4.0–10.5)

## 2024-11-22 LAB — TSH: TSH: 2.19 u[IU]/mL (ref 0.35–5.50)

## 2024-11-22 LAB — LIPID PANEL
Cholesterol: 92 mg/dL (ref 28–200)
HDL: 45.4 mg/dL (ref >39.00)
LDL Cholesterol: 33 mg/dL (ref 10–99)
NonHDL: 47.03
Total CHOL/HDL Ratio: 2
Triglycerides: 68 mg/dL (ref 10.0–149.0)
VLDL: 13.6 mg/dL (ref 0.0–40.0)

## 2024-11-22 LAB — VITAMIN D 25 HYDROXY (VIT D DEFICIENCY, FRACTURES): VITD: 44.09 ng/mL (ref 30.00–100.00)

## 2024-11-22 LAB — VITAMIN B12: Vitamin B-12: 296 pg/mL (ref 211–911)

## 2024-11-22 LAB — HEMOGLOBIN A1C: Hgb A1c MFr Bld: 6.3 % (ref 4.6–6.5)

## 2024-11-22 NOTE — Assessment & Plan Note (Signed)
 Last vitamin D  Lab Results  Component Value Date   VD25OH 44.13 06/18/2024   Stable, cont oral replacement

## 2024-11-22 NOTE — Assessment & Plan Note (Addendum)
 With ckd3b  Lab Results  Component Value Date   CREATININE 1.27 06/18/2024   Stable overall, cont to avoid nephrotoxins  Lab Results  Component Value Date   HGBA1C 7.5 (H) 06/18/2024   Mid uncontrolled, pt to continue current medical treatment glucotrol  2.5 every day, metformin  ER 500 mg - 4 every day, and reduced mounjaro  to 2.5 mg due to nausea, consider change to ozempic or oral ozempic if not improved

## 2024-11-22 NOTE — Assessment & Plan Note (Signed)
 Lab Results  Component Value Date   LDLCALC 57 06/18/2024   Stable, pt to continue current statin lipitor 80 mg qd

## 2024-11-22 NOTE — Assessment & Plan Note (Signed)
 With recent psa 0.7 slightly higher than baseline, pt to f/u at 3 mo with urology for repeat

## 2024-11-22 NOTE — Assessment & Plan Note (Signed)
 BP Readings from Last 3 Encounters:  11/22/24 120/80  06/19/24 100/62  04/09/24 112/70   Stable, pt to continue medical treatment chlorthalidone  25 qd

## 2024-11-22 NOTE — Assessment & Plan Note (Signed)
 Lab Results  Component Value Date   VITAMINB12 350 06/18/2024   Stable, cont oral replacement - b12 1000 mcg qd

## 2024-11-22 NOTE — Progress Notes (Signed)
 Patient ID: [name], male   DOB: August 15, 1956, 68 y.o.   MRN: [id number]        Chief Complaint: follow up low b12, DM with ckd3b, low vit d, htn, hld       HPI:  [name] is a 68 y.o. male here overall doing ok.  Pt denies chest pain, increased sob or doe, wheezing, orthopnea, PND, increased LE swelling, palpitations, dizziness or syncope.   Pt denies polydipsia, polyuria, or new focal neuro s/s.    Pt denies fever, wt loss, night sweats, loss of appetite, or other constitutional symptoms  Good compliaince with meds.   Has hx of prostate CA and recent PSA per pt was slightly increasing to 0.7, with plan to f/u in 3 months.  Lost 30 lbs with mounjaro  5 mg but apparently more than mild GI effect such as nausea, asks for decreased dose Wt Readings from Last 3 Encounters:  11/22/24 230 lb (104.3 kg)  06/19/24 262 lb 6.4 oz (119 kg)  06/06/24 260 lb (117.9 kg)   BP Readings from Last 3 Encounters:  11/22/24 120/80  06/19/24 100/62  04/09/24 112/70         Past Medical History:  Diagnosis Date   Arthritis    BACK PAIN    CAD (coronary artery disease) 06/28/2017   CAD in native artery    a. moderate by cardiac CT 2016.   Cancer [facility])    Chronic bilateral low back pain with bilateral sciatica 01/12/2019   Degenerative arthritis of right knee 07/15/2016   Injected in 07/15/2016 Orthovisc injection started 10/07/2016 DepoMedrol 40 inj on 04/11/17, 25 cc aspirated Orthovisc injection in 05/30/2017     DEGENERATIVE JOINT DISEASE, KNEES, BILATERAL    Depression    Diabetes (HCC) 10/01/2014   Diabetes mellitus    type II   DIVERTICULOSIS, COLON    DVT, lower extremity (HCC)    right   GERD (gastroesophageal reflux disease)    History of kidney stones    Hyperlipidemia    Hypertension    HYPOGONADISM, MALE    HYPOTENSION, ORTHOSTATIC    Kidney stones    Long term (current) use of anticoagulants 08/17/2017   Long term current use of anticoagulant    Lumbosacral radiculopathy  at S1 06/14/2012   Maxillary sinus cyst 12/11/2018   Obesity    OBESITY, MORBID 03/04/2008   Qualifier: Diagnosis of  By: Norleen MD, Lynwood ORN    OSA (obstructive sleep apnea) 09/11/2013   Pulmonary emboli (HCC)    Pulmonary embolism (HCC) 02/05/2011   RBBB 07/28/2017   Renal insufficiency    a. prior h/o, does not appear chronic.   Superficial phlebitis of arm 03/18/2016   Vitamin D  deficiency 07/09/2020   Past Surgical History:  Procedure Laterality Date   ankle surgury  2001 bilateral   with bone spurs and torn tendon   BIOPSY  01/09/2024   Procedure: BIOPSY;  Surgeon: Suzann Inocente HERO, MD;  Location: [facility] ENDOSCOPY;  Service: Gastroenterology;;   COLONOSCOPY  03/18/2010   EDG  2008   chronic Duodenitis   ESOPHAGOGASTRODUODENOSCOPY (EGD) WITH PROPOFOL  N/A 01/09/2024   Procedure: ESOPHAGOGASTRODUODENOSCOPY (EGD) WITH PROPOFOL ;  Surgeon: Suzann Inocente HERO, MD;  Location: [facility] ENDOSCOPY;  Service: Gastroenterology;  Laterality: N/A;   FLEXIBLE SIGMOIDOSCOPY N/A 11/03/2021   Procedure: FLEXIBLE SIGMOIDOSCOPY;  Surgeon: Legrand Victory LITTIE DOUGLAS, MD;  Location: [facility] ENDOSCOPY;  Service: Gastroenterology;  Laterality: N/A;   GASTRIC BYPASS     HEEL SPUR  SURGERY Bilateral 2005   IR ANGIOGRAM PELVIS SELECTIVE OR SUPRASELECTIVE  11/02/2021   IR ANGIOGRAM PELVIS SELECTIVE OR SUPRASELECTIVE  11/04/2021   IR ANGIOGRAM SELECTIVE EACH ADDITIONAL VESSEL  11/02/2021   IR ANGIOGRAM SELECTIVE EACH ADDITIONAL VESSEL  11/02/2021   IR ANGIOGRAM SELECTIVE EACH ADDITIONAL VESSEL  11/02/2021   IR ANGIOGRAM SELECTIVE EACH ADDITIONAL VESSEL  11/04/2021   IR ANGIOGRAM SELECTIVE EACH ADDITIONAL VESSEL  11/04/2021   IR ANGIOGRAM SELECTIVE EACH ADDITIONAL VESSEL  11/04/2021   IR ANGIOGRAM SELECTIVE EACH ADDITIONAL VESSEL  11/04/2021   IR ANGIOGRAM VISCERAL SELECTIVE  11/02/2021   IR ANGIOGRAM VISCERAL SELECTIVE  11/04/2021   IR EMBO ART  VEN HEMORR LYMPH EXTRAV  INC GUIDE ROADMAPPING  11/02/2021   IR EMBO ART  VEN HEMORR  LYMPH EXTRAV  INC GUIDE ROADMAPPING  11/04/2021   IR EMBO ART  VEN HEMORR LYMPH EXTRAV  INC GUIDE ROADMAPPING  11/04/2021   IR RADIOLOGIST EVAL & MGMT  06/01/2023   IR US  GUIDE VASC ACCESS RIGHT  11/02/2021   IR US  GUIDE VASC ACCESS RIGHT  11/04/2021   knee surgury  2000 & 2003    cartilage damage   LEFT HEART CATH AND CORONARY ANGIOGRAPHY N/A 02/16/2021   Procedure: LEFT HEART CATH AND CORONARY ANGIOGRAPHY;  Surgeon: Wonda Sharper, MD;  Location: [facility] INVASIVE CV LAB;  Service: Cardiovascular;  Laterality: N/A;   LUMBAR LAMINECTOMY/DECOMPRESSION MICRODISCECTOMY  09/28/2012   Procedure: LUMBAR LAMINECTOMY/DECOMPRESSION MICRODISCECTOMY 2 LEVELS;  Surgeon: Lynwood SHAUNNA Bern, MD;  Location: [facility] ORS;  Service: Orthopedics;  Laterality: Left;  Decompressive laminectomy L4-L5. Microdiscectomy L5-S1   Skin removal surgery     extra abdominal skin removed from his weight loss   TONSILLECTOMY AND ADENOIDECTOMY  age 81 or 6   TOTAL KNEE ARTHROPLASTY  01/10/2012   Procedure: TOTAL KNEE ARTHROPLASTY;  Surgeon: Donnice JONETTA Car, MD;  Location: [facility] ORS;  Service: Orthopedics;  Laterality: Left;   TOTAL KNEE ARTHROPLASTY Right 11/18/2020   Procedure: TOTAL KNEE ARTHROPLASTY;  Surgeon: Car Donnice, MD;  Location: [facility] ORS;  Service: Orthopedics;  Laterality: Right;  70 mins   UPPER GASTROINTESTINAL ENDOSCOPY  03/18/2010    reports that he quit smoking about 45 years ago. His smoking use included cigarettes. He started smoking about 50 years ago. He has a 1.3 pack-year smoking history. He has never used smokeless tobacco. He reports current alcohol  use of about 2.0 standard drinks of alcohol  per week. He reports that he does not use drugs. family history includes Diabetes in his brother, sister, sister, and sister; Heart failure in his sister; Hypertension in his mother; Prostate cancer in his father. Allergies[1] Medications Ordered Prior to Encounter[2]      ROS:  All others reviewed and negative.  Objective         PE:  BP 120/80 (BP Location: Left Arm, Patient Position: Sitting, Cuff Size: Normal)   Pulse 60   Temp 98.3 F (36.8 C) (Oral)   Ht 6' 2 (1.88 m)   Wt 230 lb (104.3 kg)   SpO2 97%   BMI 29.53 kg/m                 Constitutional: Pt appears in NAD               HENT: Head: NCAT.                Right Ear: External ear normal.  Left Ear: External ear normal.                Eyes: . Pupils are equal, round, and reactive to light. Conjunctivae and EOM are normal               Nose: without d/c or deformity               Neck: Neck supple. Gross normal ROM               Cardiovascular: Normal rate and regular rhythm.                 Pulmonary/Chest: Effort normal and breath sounds without rales or wheezing.                Abd:  Soft, NT, ND, + BS, no organomegaly               Neurological: Pt is alert. At baseline orientation, motor grossly intact               Skin: Skin is warm. No rashes, no other new lesions, LE edema - none               Psychiatric: Pt behavior is normal without agitation   Micro: none  Cardiac tracings I have personally interpreted today:  none  Pertinent Radiological findings (summarize): none   Lab Results  Component Value Date   WBC 5.8 06/18/2024   HGB 12.6 (L) 06/18/2024   HCT 40.6 06/18/2024   PLT 157.0 06/18/2024   GLUCOSE 121 (H) 06/18/2024   CHOL 117 06/18/2024   TRIG 65.0 06/18/2024   HDL 46.30 06/18/2024   LDLDIRECT 116.0 01/15/2020   LDLCALC 57 06/18/2024   ALT 41 06/18/2024   AST 23 06/18/2024   NA 138 06/18/2024   K 4.3 06/18/2024   CL 103 06/18/2024   CREATININE 1.27 06/18/2024   BUN 25 (H) 06/18/2024   CO2 26 06/18/2024   TSH 2.76 06/18/2024   PSA 0.46 06/18/2024   INR 1.2 (H) 06/22/2023   HGBA1C 7.5 (H) 06/18/2024   MICROALBUR <0.7 06/18/2024   Assessment/Plan:  [name] is a 68 y.o. Black or African American [2] male with  has a past medical history of Arthritis, BACK PAIN, CAD (coronary artery  disease) (06/28/2017), CAD in native artery, Cancer (HCC), Chronic bilateral low back pain with bilateral sciatica (01/12/2019), Degenerative arthritis of right knee (07/15/2016), DEGENERATIVE JOINT DISEASE, KNEES, BILATERAL, Depression, Diabetes (HCC) (10/01/2014), Diabetes mellitus, DIVERTICULOSIS, COLON, DVT, lower extremity (HCC), GERD (gastroesophageal reflux disease), History of kidney stones, Hyperlipidemia, Hypertension, HYPOGONADISM, MALE, HYPOTENSION, ORTHOSTATIC, Kidney stones, Long term (current) use of anticoagulants (08/17/2017), Long term current use of anticoagulant, Lumbosacral radiculopathy at S1 (06/14/2012), Maxillary sinus cyst (12/11/2018), Obesity, OBESITY, MORBID (03/04/2008), OSA (obstructive sleep apnea) (09/11/2013), Pulmonary emboli (HCC), Pulmonary embolism (HCC) (02/05/2011), RBBB (07/28/2017), Renal insufficiency, Superficial phlebitis of arm (03/18/2016), and Vitamin D  deficiency (07/09/2020).  Diabetes (HCC) With ckd3b  Lab Results  Component Value Date   CREATININE 1.27 06/18/2024   Stable overall, cont to avoid nephrotoxins  Lab Results  Component Value Date   HGBA1C 7.5 (H) 06/18/2024   Mid uncontrolled, pt to continue current medical treatment glucotrol  2.5 every day, metformin  ER 500 mg - 4 every day, and reduced mounjaro  to 2.5 mg due to nausea, consider change to ozempic or oral ozempic if not improved   Malignant neoplasm of prostate (HCC) With recent psa 0.7 slightly higher than baseline, pt  to f/u at 3 mo with urology for repeat  B12 deficiency Lab Results  Component Value Date   VITAMINB12 350 06/18/2024   Stable, cont oral replacement - b12 1000 mcg qd   Essential hypertension BP Readings from Last 3 Encounters:  11/22/24 120/80  06/19/24 100/62  04/09/24 112/70   Stable, pt to continue medical treatment chlorthalidone  25 qd   Hyperlipidemia Lab Results  Component Value Date   LDLCALC 57 06/18/2024   Stable, pt to continue  current statin lipitor 80 mg qd   Vitamin D  deficiency Last vitamin D  Lab Results  Component Value Date   VD25OH 44.13 06/18/2024   Stable, cont oral replacement  Followup: Return in about 6 months (around 05/23/2025).  Lynwood Rush, MD 11/22/2024 1:00 PM [facility] Medicine     [1]  Allergies Allergen Reactions   Other Other (See Comments)    NO BLOOD -JEHOVAH'S WITNESS-SIGNED REFUSAL BUT WOULD TAKE ALBUMIN    Adhesive [Tape] Other (See Comments)    Painful, Please use paper tape  [2]  Current Outpatient Medications on File Prior to Visit  Medication Sig Dispense Refill   Artificial Tear Ointment (ARTIFICIAL TEARS) ointment Place 1 drop into both eyes every 4 (four) hours as needed (dry eyes).      atorvastatin  (LIPITOR) 80 MG tablet TAKE 1 TABLET BY MOUTH EVERY DAY 90 tablet 3   chlorthalidone  (HYGROTON ) 25 MG tablet TAKE 1 TABLET (25 MG TOTAL) BY MOUTH DAILY. 90 tablet 3   Cholecalciferol (VITAMIN D3) 125 MCG (5000 UT) CAPS Take 5,000 Units by mouth daily.      doxycycline  (VIBRA -TABS) 100 MG tablet Take 1 tablet (100 mg total) by mouth 2 (two) times daily. 14 tablet 0   ferrous sulfate  325 (65 FE) MG EC tablet TAKE 1 TABLET BY MOUTH EVERY DAY 90 tablet 1   folic acid  (FOLVITE ) 1 MG tablet TAKE 1 TABLET BY MOUTH EVERY DAY 90 tablet 1   glipiZIDE  (GLUCOTROL  XL) 2.5 MG 24 hr tablet Take 1 tablet (2.5 mg total) by mouth daily with breakfast. 90 tablet 3   metFORMIN  (GLUCOPHAGE -XR) 500 MG 24 hr tablet TAKE 4 TABLETS BY MOUTH DAILY WITH BREAKFAST 360 tablet 3   Multiple Vitamin (MULTIVITAMIN WITH MINERALS) TABS tablet Take 1 tablet by mouth daily at 12 noon.     nitroGLYCERIN  (NITROSTAT ) 0.4 MG SL tablet Place 1 tablet (0.4 mg total) under the tongue every 5 (five) minutes as needed for chest pain. 90 tablet 3   omeprazole  (PRILOSEC) 40 MG capsule Take 1 capsule (40 mg total) by mouth 2 (two) times daily. 90 capsule 3    ONETOUCH ULTRA TEST test strip USE AS INSTRUCTED 100 strip 12   traMADol  (ULTRAM ) 50 MG tablet SMARTSIG:1 Tablet(s) By Mouth Every 12 Hours PRN     vitamin B-12 1000 MCG tablet Take 1 tablet (1,000 mcg total) by mouth daily. 30 tablet 1   No current facility-administered medications on file prior to visit.

## 2024-11-22 NOTE — Patient Instructions (Signed)
 Ok to decrease the mounjaro  to 2.5 mg weekly and hopefully the GI symptoms will improve  Please continue all other medications as before, and refills have been done if requested.  Please have the pharmacy call with any other refills you may need.  Please continue your efforts at being more active, low cholesterol diet, and weight control.  Please keep your appointments with your specialists as you may have planned  Please go to the LAB at the blood drawing area for the tests to be done  You will be contacted by phone if any changes need to be made immediately.  Otherwise, you will receive a letter about your results with an explanation, but please check with MyChart first.  Please make an Appointment to return in 6 months, or sooner if needed

## 2024-11-23 NOTE — Progress Notes (Signed)
 The test results show that your current treatment is OK, as the tests are stable.  Please continue the same plan.  There is no other need for change of treatment or further evaluation based on these results, at this time.  thanks

## 2024-11-26 DIAGNOSIS — Z8546 Personal history of malignant neoplasm of prostate: Secondary | ICD-10-CM | POA: Insufficient documentation

## 2024-11-26 NOTE — Addendum Note (Signed)
 Addended by: NORLEEN LYNWOOD ORN on: 11/26/2024 12:26 PM   Modules accepted: Level of Service

## 2024-11-26 NOTE — Assessment & Plan Note (Signed)
 With recent psa 0.7 slightly higher than baseline, pt to f/u at 3 mo with urology for repeat

## 2024-12-20 ENCOUNTER — Ambulatory Visit: Admitting: Internal Medicine

## 2024-12-20 ENCOUNTER — Encounter: Payer: Self-pay | Admitting: Internal Medicine

## 2024-12-20 VITALS — BP 126/82 | HR 66 | Temp 98.4°F | Ht 74.0 in | Wt 222.0 lb

## 2024-12-20 DIAGNOSIS — R1013 Epigastric pain: Secondary | ICD-10-CM

## 2024-12-20 DIAGNOSIS — I10 Essential (primary) hypertension: Secondary | ICD-10-CM | POA: Diagnosis not present

## 2024-12-20 DIAGNOSIS — E1122 Type 2 diabetes mellitus with diabetic chronic kidney disease: Secondary | ICD-10-CM

## 2024-12-20 DIAGNOSIS — N1831 Chronic kidney disease, stage 3a: Secondary | ICD-10-CM | POA: Diagnosis not present

## 2024-12-20 DIAGNOSIS — Z7984 Long term (current) use of oral hypoglycemic drugs: Secondary | ICD-10-CM | POA: Diagnosis not present

## 2024-12-20 MED ORDER — SUCRALFATE 1 G PO TABS: 1.0000 g | ORAL_TABLET | Freq: Four times a day (QID) | ORAL | 0 refills | Status: DC

## 2024-12-20 NOTE — Assessment & Plan Note (Signed)
 Mild to mod, likely gastritis or ulcer recurrent, to continue the prilosec 40 bid, add carafate  1 gm qid x 1 mo, ad refer GI as may need repeat EGD; pt to f/u any worsening symptoms or concerns

## 2024-12-20 NOTE — Assessment & Plan Note (Signed)
 With ckd3a  Lab Results  Component Value Date   HGBA1C 6.3 11/22/2024   Stable, pt to continue current medical treatment mounjaro  2.5 mg weeily, metformin  er 500 mg -4 every day, and glucotrol  xl 2.5 mg qd

## 2024-12-20 NOTE — Patient Instructions (Signed)
 Please take all new medication as prescribed- the carafate  four times per day for 1 month  Please continue all other medications as before, including the omeprazole   Please have the pharmacy call with any other refills you may need.  Please continue your efforts at being more active, low cholesterol diet, and weight control.  Please keep your appointments with your specialists as you may have planned  You will be contacted regarding the referral for: Gastroenterology Dr Avram  We can hold on further labs today  Please make an Appointment to return in 6 months, or sooner if needed

## 2024-12-20 NOTE — Assessment & Plan Note (Signed)
 BP Readings from Last 3 Encounters:  12/20/24 126/82  11/22/24 120/80  06/19/24 100/62   Stable, pt to continue medical treatment hygroton  25 qd

## 2024-12-20 NOTE — Progress Notes (Signed)
 Patient ID: [name], male   DOB: 01-13-1956, 69 y.o.   MRN: [id number]        Chief Complaint: follow up HTN, HLD, DM and dyspepsia       HPI:  [name] is a 69 y.o. male here overall doing ok except for 1 mo worsening epigastric discomfort with nausea sick feeling, despite good compliance with prilosec 40 mg bid, Has hx of anastomotic ulcer s/p gastrojejunostomy gastric bypass.  No vomiting.   Pt denies chest pain, increased sob or doe, wheezing, orthopnea, PND, increased LE swelling, palpitations, dizziness or syncope.   Pt denies polydipsia, polyuria, or new focal neuro s/s.    Pt denies fever, wt loss, night sweats, loss of appetite, or other constitutional symptoms  Lost 40 lbs with low dose mounjaro , has occasional diarreheal stools with this (no blood) Wt Readings from Last 3 Encounters:  12/20/24 222 lb (100.7 kg)  11/22/24 230 lb (104.3 kg)  06/19/24 262 lb 6.4 oz (119 kg)   BP Readings from Last 3 Encounters:  12/20/24 126/82  11/22/24 120/80  06/19/24 100/62         Past Medical History:  Diagnosis Date   Arthritis    BACK PAIN    CAD (coronary artery disease) 06/28/2017   CAD in native artery    a. moderate by cardiac CT 2016.   Cancer [facility])    Chronic bilateral low back pain with bilateral sciatica 01/12/2019   Degenerative arthritis of right knee 07/15/2016   Injected in 07/15/2016 Orthovisc injection started 10/07/2016 DepoMedrol 40 inj on 04/11/17, 25 cc aspirated Orthovisc injection in 05/30/2017     DEGENERATIVE JOINT DISEASE, KNEES, BILATERAL    Depression    Diabetes (HCC) 10/01/2014   Diabetes mellitus    type II   DIVERTICULOSIS, COLON    DVT, lower extremity (HCC)    right   GERD (gastroesophageal reflux disease)    History of kidney stones    Hyperlipidemia    Hypertension    HYPOGONADISM, MALE    HYPOTENSION, ORTHOSTATIC    Kidney stones    Long term (current) use of anticoagulants 08/17/2017   Long term current use of anticoagulant     Lumbosacral radiculopathy at S1 06/14/2012   Maxillary sinus cyst 12/11/2018   Obesity    OBESITY, MORBID 03/04/2008   Qualifier: Diagnosis of  By: Norleen MD, Lynwood ORN    OSA (obstructive sleep apnea) 09/11/2013   Pulmonary emboli (HCC)    Pulmonary embolism (HCC) 02/05/2011   RBBB 07/28/2017   Renal insufficiency    a. prior h/o, does not appear chronic.   Superficial phlebitis of arm 03/18/2016   Vitamin D  deficiency 07/09/2020   Past Surgical History:  Procedure Laterality Date   ankle surgury  2001 bilateral   with bone spurs and torn tendon   BIOPSY  01/09/2024   Procedure: BIOPSY;  Surgeon: Suzann Inocente HERO, MD;  Location: [facility] ENDOSCOPY;  Service: Gastroenterology;;   COLONOSCOPY  03/18/2010   EDG  2008   chronic Duodenitis   ESOPHAGOGASTRODUODENOSCOPY (EGD) WITH PROPOFOL  N/A 01/09/2024   Procedure: ESOPHAGOGASTRODUODENOSCOPY (EGD) WITH PROPOFOL ;  Surgeon: Suzann Inocente HERO, MD;  Location: [facility] ENDOSCOPY;  Service: Gastroenterology;  Laterality: N/A;   FLEXIBLE SIGMOIDOSCOPY N/A 11/03/2021   Procedure: FLEXIBLE SIGMOIDOSCOPY;  Surgeon: Legrand Victory LITTIE DOUGLAS, MD;  Location: [facility] ENDOSCOPY;  Service: Gastroenterology;  Laterality: N/A;   GASTRIC BYPASS     HEEL SPUR SURGERY Bilateral 2005   IR ANGIOGRAM PELVIS  SELECTIVE OR SUPRASELECTIVE  11/02/2021   IR ANGIOGRAM PELVIS SELECTIVE OR SUPRASELECTIVE  11/04/2021   IR ANGIOGRAM SELECTIVE EACH ADDITIONAL VESSEL  11/02/2021   IR ANGIOGRAM SELECTIVE EACH ADDITIONAL VESSEL  11/02/2021   IR ANGIOGRAM SELECTIVE EACH ADDITIONAL VESSEL  11/02/2021   IR ANGIOGRAM SELECTIVE EACH ADDITIONAL VESSEL  11/04/2021   IR ANGIOGRAM SELECTIVE EACH ADDITIONAL VESSEL  11/04/2021   IR ANGIOGRAM SELECTIVE EACH ADDITIONAL VESSEL  11/04/2021   IR ANGIOGRAM SELECTIVE EACH ADDITIONAL VESSEL  11/04/2021   IR ANGIOGRAM VISCERAL SELECTIVE  11/02/2021   IR ANGIOGRAM VISCERAL SELECTIVE  11/04/2021   IR EMBO ART  VEN HEMORR LYMPH EXTRAV  INC GUIDE ROADMAPPING  11/02/2021    IR EMBO ART  VEN HEMORR LYMPH EXTRAV  INC GUIDE ROADMAPPING  11/04/2021   IR EMBO ART  VEN HEMORR LYMPH EXTRAV  INC GUIDE ROADMAPPING  11/04/2021   IR RADIOLOGIST EVAL & MGMT  06/01/2023   IR US  GUIDE VASC ACCESS RIGHT  11/02/2021   IR US  GUIDE VASC ACCESS RIGHT  11/04/2021   knee surgury  2000 & 2003    cartilage damage   LEFT HEART CATH AND CORONARY ANGIOGRAPHY N/A 02/16/2021   Procedure: LEFT HEART CATH AND CORONARY ANGIOGRAPHY;  Surgeon: Wonda Sharper, MD;  Location: [facility] INVASIVE CV LAB;  Service: Cardiovascular;  Laterality: N/A;   LUMBAR LAMINECTOMY/DECOMPRESSION MICRODISCECTOMY  09/28/2012   Procedure: LUMBAR LAMINECTOMY/DECOMPRESSION MICRODISCECTOMY 2 LEVELS;  Surgeon: Lynwood SHAUNNA Bern, MD;  Location: [facility] ORS;  Service: Orthopedics;  Laterality: Left;  Decompressive laminectomy L4-L5. Microdiscectomy L5-S1   Skin removal surgery     extra abdominal skin removed from his weight loss   TONSILLECTOMY AND ADENOIDECTOMY  age 41 or 6   TOTAL KNEE ARTHROPLASTY  01/10/2012   Procedure: TOTAL KNEE ARTHROPLASTY;  Surgeon: Donnice JONETTA Car, MD;  Location: [facility] ORS;  Service: Orthopedics;  Laterality: Left;   TOTAL KNEE ARTHROPLASTY Right 11/18/2020   Procedure: TOTAL KNEE ARTHROPLASTY;  Surgeon: Car Donnice, MD;  Location: [facility] ORS;  Service: Orthopedics;  Laterality: Right;  70 mins   UPPER GASTROINTESTINAL ENDOSCOPY  03/18/2010    reports that he quit smoking about 46 years ago. His smoking use included cigarettes. He started smoking about 51 years ago. He has a 1.3 pack-year smoking history. He has never used smokeless tobacco. He reports current alcohol  use of about 2.0 standard drinks of alcohol  per week. He reports that he does not use drugs. family history includes Diabetes in his brother, sister, sister, and sister; Heart failure in his sister; Hypertension in his mother; Prostate cancer in his father. Allergies[1] Medications Ordered Prior to Encounter[2]      ROS:  All others reviewed and  negative.  Objective        PE:  BP 126/82 (BP Location: Right Arm, Patient Position: Sitting, Cuff Size: Normal)   Pulse 66   Temp 98.4 F (36.9 C) (Oral)   Ht 6' 2 (1.88 m)   Wt 222 lb (100.7 kg)   SpO2 99%   BMI 28.50 kg/m                 Constitutional: Pt appears in NAD               HENT: Head: NCAT.                Right Ear: External ear normal.                 Left Ear: External ear normal.  Eyes: . Pupils are equal, round, and reactive to light. Conjunctivae and EOM are normal               Nose: without d/c or deformity               Neck: Neck supple. Gross normal ROM               Cardiovascular: Normal rate and regular rhythm.                 Pulmonary/Chest: Effort normal and breath sounds without rales or wheezing.                Abd:  Soft, NT, ND, + BS, no organomegaly               Neurological: Pt is alert. At baseline orientation, motor grossly intact               Skin: Skin is warm. No rashes, no other new lesions, LE edema - none               Psychiatric: Pt behavior is normal without agitation   Micro: none  Cardiac tracings I have personally interpreted today:  none  Pertinent Radiological findings (summarize): none   Lab Results  Component Value Date   WBC 4.6 11/22/2024   HGB 12.1 (L) 11/22/2024   HCT 39.2 11/22/2024   PLT 175.0 11/22/2024   GLUCOSE 74 11/22/2024   CHOL 92 11/22/2024   TRIG 68.0 11/22/2024   HDL 45.40 11/22/2024   LDLDIRECT 116.0 01/15/2020   LDLCALC 33 11/22/2024   ALT 23 11/22/2024   AST 20 11/22/2024   NA 139 11/22/2024   K 3.6 11/22/2024   CL 100 11/22/2024   CREATININE 1.32 11/22/2024   BUN 26 (H) 11/22/2024   CO2 22 11/22/2024   TSH 2.19 11/22/2024   PSA 0.46 06/18/2024   INR 1.2 (H) 06/22/2023   HGBA1C 6.3 11/22/2024   MICROALBUR 0.8 11/22/2024   Assessment/Plan:  [name] is a 69 y.o. Black or African American [2] male with  has a past medical history of Arthritis, BACK PAIN, CAD  (coronary artery disease) (06/28/2017), CAD in native artery, Cancer (HCC), Chronic bilateral low back pain with bilateral sciatica (01/12/2019), Degenerative arthritis of right knee (07/15/2016), DEGENERATIVE JOINT DISEASE, KNEES, BILATERAL, Depression, Diabetes (HCC) (10/01/2014), Diabetes mellitus, DIVERTICULOSIS, COLON, DVT, lower extremity (HCC), GERD (gastroesophageal reflux disease), History of kidney stones, Hyperlipidemia, Hypertension, HYPOGONADISM, MALE, HYPOTENSION, ORTHOSTATIC, Kidney stones, Long term (current) use of anticoagulants (08/17/2017), Long term current use of anticoagulant, Lumbosacral radiculopathy at S1 (06/14/2012), Maxillary sinus cyst (12/11/2018), Obesity, OBESITY, MORBID (03/04/2008), OSA (obstructive sleep apnea) (09/11/2013), Pulmonary emboli (HCC), Pulmonary embolism (HCC) (02/05/2011), RBBB (07/28/2017), Renal insufficiency, Superficial phlebitis of arm (03/18/2016), and Vitamin D  deficiency (07/09/2020).  Dyspepsia Mild to mod, likely gastritis or ulcer recurrent, to continue the prilosec 40 bid, add carafate  1 gm qid x 1 mo, ad refer GI as may need repeat EGD; pt to f/u any worsening symptoms or concerns  Diabetes mellitus with chronic kidney disease (HCC) With ckd3a  Lab Results  Component Value Date   HGBA1C 6.3 11/22/2024   Stable, pt to continue current medical treatment mounjaro  2.5 mg weeily, metformin  er 500 mg -4 every day, and glucotrol  xl 2.5 mg qd   Essential hypertension BP Readings from Last 3 Encounters:  12/20/24 126/82  11/22/24 120/80  06/19/24 100/62   Stable, pt to continue medical treatment hygroton  25  qd  Followup: Return in about 6 months (around 06/19/2025).  Lynwood Rush, MD 12/20/2024 9:34 AM [facility] Medicine     [1]  Allergies Allergen Reactions   Other Other (See Comments)    NO BLOOD -JEHOVAH'S WITNESS-SIGNED REFUSAL BUT WOULD TAKE ALBUMIN    Adhesive  [Tape] Other (See Comments)    Painful, Please use paper tape  [2]  Current Outpatient Medications on File Prior to Visit  Medication Sig Dispense Refill   Artificial Tear Ointment (ARTIFICIAL TEARS) ointment Place 1 drop into both eyes every 4 (four) hours as needed (dry eyes).      atorvastatin  (LIPITOR) 80 MG tablet TAKE 1 TABLET BY MOUTH EVERY DAY 90 tablet 3   chlorthalidone  (HYGROTON ) 25 MG tablet TAKE 1 TABLET (25 MG TOTAL) BY MOUTH DAILY. 90 tablet 3   Cholecalciferol (VITAMIN D3) 125 MCG (5000 UT) CAPS Take 5,000 Units by mouth daily.      doxycycline  (VIBRA -TABS) 100 MG tablet Take 1 tablet (100 mg total) by mouth 2 (two) times daily. 14 tablet 0   ferrous sulfate  325 (65 FE) MG EC tablet TAKE 1 TABLET BY MOUTH EVERY DAY 90 tablet 1   folic acid  (FOLVITE ) 1 MG tablet TAKE 1 TABLET BY MOUTH EVERY DAY 90 tablet 1   glipiZIDE  (GLUCOTROL  XL) 2.5 MG 24 hr tablet Take 1 tablet (2.5 mg total) by mouth daily with breakfast. 90 tablet 3   metFORMIN  (GLUCOPHAGE -XR) 500 MG 24 hr tablet TAKE 4 TABLETS BY MOUTH DAILY WITH BREAKFAST 360 tablet 3   Multiple Vitamin (MULTIVITAMIN WITH MINERALS) TABS tablet Take 1 tablet by mouth daily at 12 noon.     nitroGLYCERIN  (NITROSTAT ) 0.4 MG SL tablet Place 1 tablet (0.4 mg total) under the tongue every 5 (five) minutes as needed for chest pain. 90 tablet 3   omeprazole  (PRILOSEC) 40 MG capsule Take 1 capsule (40 mg total) by mouth 2 (two) times daily. 90 capsule 3   ONETOUCH ULTRA TEST test strip USE AS INSTRUCTED 100 strip 12   tirzepatide  (MOUNJARO ) 2.5 MG/0.5ML Pen Inject 2.5 mg into the skin once a week. 6 mL 3   traMADol  (ULTRAM ) 50 MG tablet SMARTSIG:1 Tablet(s) By Mouth Every 12 Hours PRN     vitamin B-12 1000 MCG tablet Take 1 tablet (1,000 mcg total) by mouth daily. 30 tablet 1   No current facility-administered medications on file prior to visit.

## 2025-01-11 ENCOUNTER — Other Ambulatory Visit: Payer: Self-pay | Admitting: Internal Medicine

## 2025-01-11 ENCOUNTER — Other Ambulatory Visit: Payer: Self-pay

## 2025-05-17 ENCOUNTER — Encounter (INDEPENDENT_AMBULATORY_CARE_PROVIDER_SITE_OTHER): Admitting: Ophthalmology

## 2025-06-10 ENCOUNTER — Ambulatory Visit
